# Patient Record
Sex: Male | Born: 1945 | Race: Black or African American | Hispanic: No | Marital: Married | Smoking: Former smoker
Health system: Southern US, Community
[De-identification: ages and names within clinical notes are randomized; demographics above are authoritative.]

## PROBLEM LIST (undated history)

## (undated) DIAGNOSIS — M1990 Unspecified osteoarthritis, unspecified site: Secondary | ICD-10-CM

## (undated) DIAGNOSIS — I5030 Unspecified diastolic (congestive) heart failure: Secondary | ICD-10-CM

## (undated) DIAGNOSIS — Z899 Acquired absence of limb, unspecified: Secondary | ICD-10-CM

## (undated) DIAGNOSIS — I700 Atherosclerosis of aorta: Secondary | ICD-10-CM

## (undated) DIAGNOSIS — A0472 Enterocolitis due to Clostridium difficile, not specified as recurrent: Secondary | ICD-10-CM

## (undated) DIAGNOSIS — I10 Essential (primary) hypertension: Secondary | ICD-10-CM

## (undated) DIAGNOSIS — E7800 Pure hypercholesterolemia, unspecified: Secondary | ICD-10-CM

## (undated) DIAGNOSIS — K5790 Diverticulosis of intestine, part unspecified, without perforation or abscess without bleeding: Secondary | ICD-10-CM

## (undated) DIAGNOSIS — G4730 Sleep apnea, unspecified: Secondary | ICD-10-CM

## (undated) DIAGNOSIS — I2510 Atherosclerotic heart disease of native coronary artery without angina pectoris: Secondary | ICD-10-CM

## (undated) DIAGNOSIS — K648 Other hemorrhoids: Secondary | ICD-10-CM

## (undated) DIAGNOSIS — I739 Peripheral vascular disease, unspecified: Secondary | ICD-10-CM

## (undated) DIAGNOSIS — D509 Iron deficiency anemia, unspecified: Secondary | ICD-10-CM

## (undated) DIAGNOSIS — J189 Pneumonia, unspecified organism: Secondary | ICD-10-CM

## (undated) DIAGNOSIS — E119 Type 2 diabetes mellitus without complications: Secondary | ICD-10-CM

## (undated) DIAGNOSIS — Z8739 Personal history of other diseases of the musculoskeletal system and connective tissue: Secondary | ICD-10-CM

## (undated) HISTORY — DX: Unspecified osteoarthritis, unspecified site: M19.90

## (undated) HISTORY — DX: Atherosclerotic heart disease of native coronary artery without angina pectoris: I25.10

## (undated) HISTORY — PX: CATARACT EXTRACTION W/ INTRAOCULAR LENS  IMPLANT, BILATERAL: SHX1307

## (undated) HISTORY — DX: Essential (primary) hypertension: I10

## (undated) HISTORY — DX: Atherosclerosis of aorta: I70.0

## (undated) HISTORY — PX: COLONOSCOPY W/ POLYPECTOMY: SHX1380

## (undated) HISTORY — DX: Peripheral vascular disease, unspecified: I73.9

## (undated) HISTORY — DX: Other hemorrhoids: K64.8

## (undated) HISTORY — DX: Iron deficiency anemia, unspecified: D50.9

## (undated) HISTORY — DX: Acquired absence of limb, unspecified: Z89.9

## (undated) HISTORY — DX: Diverticulosis of intestine, part unspecified, without perforation or abscess without bleeding: K57.90

---

## 1999-08-01 ENCOUNTER — Encounter: Admission: RE | Admit: 1999-08-01 | Discharge: 1999-10-30 | Payer: Self-pay | Admitting: Neurosurgery

## 1999-11-20 ENCOUNTER — Encounter: Admission: RE | Admit: 1999-11-20 | Discharge: 1999-12-05 | Payer: Self-pay

## 2002-08-04 ENCOUNTER — Ambulatory Visit (HOSPITAL_COMMUNITY): Admission: RE | Admit: 2002-08-04 | Discharge: 2002-08-04 | Payer: Self-pay | Admitting: Cardiology

## 2006-06-22 ENCOUNTER — Ambulatory Visit (HOSPITAL_COMMUNITY): Admission: RE | Admit: 2006-06-22 | Discharge: 2006-06-22 | Payer: Self-pay | Admitting: Cardiology

## 2008-03-08 ENCOUNTER — Ambulatory Visit (HOSPITAL_COMMUNITY): Admission: RE | Admit: 2008-03-08 | Discharge: 2008-03-08 | Payer: Self-pay | Admitting: Cardiology

## 2008-03-08 ENCOUNTER — Ambulatory Visit: Payer: Self-pay | Admitting: Vascular Surgery

## 2008-03-08 ENCOUNTER — Encounter (INDEPENDENT_AMBULATORY_CARE_PROVIDER_SITE_OTHER): Payer: Self-pay | Admitting: Cardiology

## 2009-01-09 ENCOUNTER — Ambulatory Visit (HOSPITAL_COMMUNITY): Admission: RE | Admit: 2009-01-09 | Discharge: 2009-01-09 | Payer: Self-pay | Admitting: Cardiology

## 2009-04-26 ENCOUNTER — Ambulatory Visit (HOSPITAL_COMMUNITY): Admission: RE | Admit: 2009-04-26 | Discharge: 2009-04-26 | Payer: Self-pay | Admitting: Cardiology

## 2009-04-26 ENCOUNTER — Encounter (INDEPENDENT_AMBULATORY_CARE_PROVIDER_SITE_OTHER): Payer: Self-pay | Admitting: Cardiology

## 2009-04-26 ENCOUNTER — Ambulatory Visit: Payer: Self-pay | Admitting: Vascular Surgery

## 2010-12-06 ENCOUNTER — Ambulatory Visit: Admit: 2010-12-06 | Payer: Self-pay | Admitting: Vascular Surgery

## 2010-12-15 ENCOUNTER — Encounter: Payer: Self-pay | Admitting: Cardiology

## 2011-01-07 ENCOUNTER — Encounter (INDEPENDENT_AMBULATORY_CARE_PROVIDER_SITE_OTHER): Payer: BC Managed Care – PPO | Admitting: Vascular Surgery

## 2011-01-07 ENCOUNTER — Other Ambulatory Visit (INDEPENDENT_AMBULATORY_CARE_PROVIDER_SITE_OTHER): Payer: BC Managed Care – PPO

## 2011-01-07 DIAGNOSIS — R0989 Other specified symptoms and signs involving the circulatory and respiratory systems: Secondary | ICD-10-CM

## 2011-01-07 DIAGNOSIS — I6529 Occlusion and stenosis of unspecified carotid artery: Secondary | ICD-10-CM

## 2011-01-07 NOTE — Consult Note (Signed)
NEW PATIENT CONSULTATION  Jeremiah [name], Jeremiah [name] DOB:  10-Dec-1945                                       01/07/2011 [id number]#:[id number]  Patient presents today for evaluation of asymptomatic carotid bruit.  He is a very pleasant 65 year old gentleman who was noted on physical exam by Dr. Shana Chute to have a carotid bruit.  He is here today for a duplex evaluation and discussion of this.  He denies any prior history of amaurosis fugax, transient ischemic attack, or stroke.  He does have a long history of non-insulin diabetes and hypertension.  He denies any cardiac disease.  He does report some claudication-type symptoms bilaterally.  He reports this is not limiting to him.  SOCIAL HISTORY:  He is single.  He quit smoking in 1982.  He does not drink alcohol.  FAMILY HISTORY:  Negative for premature atherosclerotic disease.  REVIEW OF SYSTEMS:  No weight loss or gain. VASCULAR:  He does have pain in his legs with walking. MUSCULOSKELETAL:  He does have arthritis and muscular pain.  Review systems otherwise negative.  PHYSICAL EXAMINATION:  A well-developed and well-nourished black male appearing his stated age in no acute distress. Blood pressure is 148/68, pulse 67, respirations 14. HEENT:  Normal. CHEST:  Clear bilaterally without rales, rhonchi or wheezes. HEART:  Regular rate and rhythm without murmur.  He has 2+ radial pulses bilaterally.  He does have diminished femoral pulses bilaterally.  I do not palpate distal pulses. ABDOMEN:  Mildly obese.  No masses, no tenderness, no aneurysm palpable. MUSCULOSKELETAL:  Shows no major deformity or cyanosis. NEUROLOGIC:  No focal weakness, paresthesias. SKIN:  Without ulcers or rashes.  He does have a soft right carotid bruit.  I do not appreciate a left carotid bruit.  He underwent a carotid duplex in our office today, and this shows minimal plaque bilaterally with no elevated velocities, suggesting no significant  stenosis.  I discussed this at length with patient and would not recommend any further follow-up or evaluations.  I did explain that he does seem to have mild claudication symptoms and explained that this could be treated but certainly would reserve this for worsening symptoms.  He does not have any evidence of limb-threatening ischemia. He was reassured with this discussion and will see [place] again on an as- needed basis.    Larina Earthly, M.D.  TFE/MEDQ  D:  01/07/2011  T:  01/07/2011  Job:  [id number]

## 2011-01-21 NOTE — Procedures (Unsigned)
CAROTID DUPLEX EXAM  INDICATION:  Bruit.  HISTORY: Diabetes:  Yes. Cardiac:  No. Hypertension:  Yes. Smoking:  No. Previous Surgery:  No. CV History:  No. Amaurosis Fugax No, Paresthesias No, Hemiparesis No                                      RIGHT             LEFT Brachial systolic pressure: Brachial Doppler waveforms:         Normal            Normal Vertebral direction of flow:        Antegrade         Antegrade DUPLEX VELOCITIES (cm/sec) CCA peak systolic                   118               112 ECA peak systolic                   126               112 ICA peak systolic                   123               101 ICA end diastolic                   24                24 PLAQUE MORPHOLOGY:                  Heterogeneous     Heterogeneous PLAQUE AMOUNT:                      Minimal           Minimal PLAQUE LOCATION:                    CCA/ICA/ECA       CCA/ICA/ECA  IMPRESSION: 1. Bilateral internal carotid artery velocity suggests 1% to 39%     stenosis. 2. Antegrade vertebral arteries bilaterally.  ___________________________________________ Larina Earthly, M.D.  EM/MEDQ  D:  01/07/2011  T:  01/07/2011  Job:  [id number]

## 2011-03-11 LAB — URINALYSIS, ROUTINE W REFLEX MICROSCOPIC
Bilirubin Urine: NEGATIVE
Glucose, UA: NEGATIVE mg/dL
Hgb urine dipstick: NEGATIVE
Ketones, ur: NEGATIVE mg/dL
Leukocytes, UA: NEGATIVE
Nitrite: NEGATIVE
Protein, ur: 30 mg/dL — AB
Specific Gravity, Urine: 1.023 (ref 1.005–1.030)
Urobilinogen, UA: 0.2 mg/dL (ref 0.0–1.0)
pH: 5.5 (ref 5.0–8.0)

## 2011-03-11 LAB — CBC
HCT: 40.5 % (ref 39.0–52.0)
Hemoglobin: 14.1 g/dL (ref 13.0–17.0)
MCHC: 34.7 g/dL (ref 30.0–36.0)
MCV: 87.2 fL (ref 78.0–100.0)
Platelets: 236 10*3/uL (ref 150–400)
RBC: 4.65 MIL/uL (ref 4.22–5.81)
RDW: 13.9 % (ref 11.5–15.5)
WBC: 12.6 10*3/uL — ABNORMAL HIGH (ref 4.0–10.5)

## 2011-03-11 LAB — COMPREHENSIVE METABOLIC PANEL
ALT: 19 U/L (ref 0–53)
AST: 20 U/L (ref 0–37)
Albumin: 3.8 g/dL (ref 3.5–5.2)
Alkaline Phosphatase: 120 U/L — ABNORMAL HIGH (ref 39–117)
BUN: 11 mg/dL (ref 6–23)
CO2: 26 mEq/L (ref 19–32)
Calcium: 10.1 mg/dL (ref 8.4–10.5)
Chloride: 101 mEq/L (ref 96–112)
Creatinine, Ser: 0.83 mg/dL (ref 0.4–1.5)
GFR calc Af Amer: >60 mL/min (ref >60)
GFR calc non Af Amer: >60 mL/min (ref >60)
Glucose, Bld: 145 mg/dL — ABNORMAL HIGH (ref 70–99)
Potassium: 3.8 mEq/L (ref 3.5–5.1)
Sodium: 136 mEq/L (ref 135–145)
Total Bilirubin: 1 mg/dL (ref 0.3–1.2)
Total Protein: 8.9 g/dL — ABNORMAL HIGH (ref 6.0–8.3)

## 2011-03-11 LAB — LIPID PANEL
Cholesterol: 184 mg/dL (ref 0–200)
HDL: 43 mg/dL (ref >39)
LDL Cholesterol: 113 mg/dL — ABNORMAL HIGH (ref 0–99)
Total CHOL/HDL Ratio: 4.3 RATIO
Triglycerides: 140 mg/dL (ref <150)
VLDL: 28 mg/dL (ref 0–40)

## 2011-03-11 LAB — URINE MICROSCOPIC-ADD ON

## 2011-03-11 LAB — PSA: PSA: 0.41 ng/mL (ref 0.10–4.00)

## 2011-03-11 LAB — T4: T4, Total: 8.5 ug/dL (ref 5.0–12.5)

## 2011-03-11 LAB — MICROALBUMIN, URINE: Microalb, Ur: 19.47 mg/dL — ABNORMAL HIGH (ref 0.00–1.89)

## 2011-03-11 LAB — TSH: TSH: 0.84 u[IU]/mL (ref 0.350–4.500)

## 2012-06-03 ENCOUNTER — Ambulatory Visit (HOSPITAL_COMMUNITY)
Admission: RE | Admit: 2012-06-03 | Discharge: 2012-06-03 | Disposition: A | Discharge status: Home / Self Care | Payer: BC Managed Care – PPO | Source: Ambulatory Visit | Attending: Cardiology | Admitting: Cardiology

## 2012-06-03 DIAGNOSIS — R0989 Other specified symptoms and signs involving the circulatory and respiratory systems: Secondary | ICD-10-CM

## 2012-06-03 NOTE — Progress Notes (Signed)
*  PRELIMINARY RESULTS* Vascular Ultrasound Carotid Duplex (Doppler) has been completed.  Preliminary findings: Bilaterally no significant ICA stenosis with antegrade vertebral flow.  EUNICE, Noble Cicalese RDMS 06/03/2012, 10:41 AM

## 2014-02-20 ENCOUNTER — Other Ambulatory Visit (HOSPITAL_COMMUNITY): Payer: Self-pay | Admitting: Cardiology

## 2014-02-20 DIAGNOSIS — I70219 Atherosclerosis of native arteries of extremities with intermittent claudication, unspecified extremity: Secondary | ICD-10-CM

## 2014-02-20 DIAGNOSIS — I739 Peripheral vascular disease, unspecified: Secondary | ICD-10-CM

## 2014-03-27 ENCOUNTER — Ambulatory Visit (HOSPITAL_COMMUNITY): Payer: Medicare Other

## 2014-03-28 ENCOUNTER — Ambulatory Visit (HOSPITAL_COMMUNITY)
Admission: RE | Admit: 2014-03-28 | Discharge: 2014-03-28 | Disposition: A | Discharge status: Home / Self Care | Payer: Medicare Other | Source: Ambulatory Visit | Attending: Cardiology | Admitting: Cardiology

## 2014-03-28 ENCOUNTER — Other Ambulatory Visit (HOSPITAL_COMMUNITY): Payer: Self-pay | Admitting: Cardiology

## 2014-03-28 DIAGNOSIS — M79609 Pain in unspecified limb: Secondary | ICD-10-CM | POA: Insufficient documentation

## 2014-03-28 DIAGNOSIS — I739 Peripheral vascular disease, unspecified: Secondary | ICD-10-CM | POA: Insufficient documentation

## 2014-03-28 DIAGNOSIS — I70219 Atherosclerosis of native arteries of extremities with intermittent claudication, unspecified extremity: Secondary | ICD-10-CM | POA: Insufficient documentation

## 2014-03-28 NOTE — Progress Notes (Signed)
VASCULAR LAB PRELIMINARY  ARTERIAL = Right Mid femoral artery demonstrates 50-99% stenosis, with monophasic flow distally. Left Distal femoral artery is occluded, with collateral monophasic flow distally.  ABI completed:   RIGHT    LEFT    PRESSURE WAVEFORM  PRESSURE WAVEFORM  BRACHIAL 146 Tri BRACHIAL 138 Tri  DP 87 Damp mono DP    AT   AT    PT 85 Damp mono PT 98 Mono  PER   PER    GREAT TOE  NA GREAT TOE  NA    RIGHT LEFT  ABI 0.6 0.67    [address]Virgina Slaughter, RVS [place] ChardJill I Eunice, RVT, RDMS  03/28/2014, 10:59 AM

## 2014-08-21 ENCOUNTER — Encounter: Payer: Self-pay | Admitting: Vascular Surgery

## 2014-09-04 ENCOUNTER — Encounter: Payer: Self-pay | Admitting: Vascular Surgery

## 2014-09-05 ENCOUNTER — Encounter: Payer: Self-pay | Admitting: Vascular Surgery

## 2014-09-05 ENCOUNTER — Ambulatory Visit (INDEPENDENT_AMBULATORY_CARE_PROVIDER_SITE_OTHER): Payer: Medicare Other | Admitting: Vascular Surgery

## 2014-09-05 VITALS — BP 136/73 | HR 60 | Resp 16 | Ht 69.0 in | Wt 198.6 lb

## 2014-09-05 DIAGNOSIS — I739 Peripheral vascular disease, unspecified: Secondary | ICD-10-CM | POA: Insufficient documentation

## 2014-09-05 NOTE — Progress Notes (Signed)
Patient name: Jeremiah [name] MRN: [id number] DOB: 1946/07/11 Sex: male   Referred by: S[place]  Reason for referral:  Chief Complaint  Patient presents with  . New Evaluation    pain bilateral great toe (R>L)    . PVD    HISTORY OF PRESENT ILLNESS: Patient presents today for discussion of lower extremity pain. I had seen him in 2012th. At that time he was found to have carotid bruits. His carotid duplex that time showed no evidence of any carotid stenosis. He did have claudication type symptoms I explained that he should continue his walking her. Questioning the patient reports it is having pain in his right great toe. This can be worse at night. He does walk and walking program and report some right hip pain is attributed this to prior spinal stenosis diagnosis rather than claudication type symptoms. He specifically has had no tissue loss such as ulceration or ingrown toenail of the feet. Does have a long history of diabetes. Recent noninvasive studies suggested moderate to severe at her lower arch her insufficiency  Past Medical History  Diagnosis Date  . Diabetes mellitus without complication   . Peripheral vascular disease   . Arthritis   . Hypertension     History reviewed. No pertinent past surgical history.  History   Social History  . Marital Status: Divorced    Spouse Name: N/A    Number of Children: N/A  . Years of Education: N/A   Occupational History  . Not on file.   Social History Main Topics  . Smoking status: Former Smoker    Quit date: 11/25/1983  . Smokeless tobacco: Not on file  . Alcohol Use: No  . Drug Use: No  . Sexual Activity: Not on file   Other Topics Concern  . Not on file   Social History Narrative  . No narrative on file    Family History  Problem Relation Age of Onset  . Diabetes Mother   . Hypertension Mother   . Heart disease Father   . Heart attack Father   . Diabetes Sister   . Hypertension Sister   . Diabetes Brother    . Heart disease Brother   . Hypertension Brother   . Heart attack Brother     Allergies as of 09/05/2014  . (No Known Allergies)    No current outpatient prescriptions on file prior to visit.   No current facility-administered medications on file prior to visit.     REVIEW OF SYSTEMS:  Positives indicated with an "X"  CARDIOVASCULAR:  [ ]  chest pain   [ ]  chest pressure   [ ]  palpitations   [ ]  orthopnea   [ ]  dyspnea on exertion   [x ] claudication   [x ] rest pain   [ ]  DVT   [ ]  phlebitis PULMONARY:   [ ]  productive cough   [ ]  asthma   [ ]  wheezing NEUROLOGIC:   [ ]  weakness  [x ] paresthesias  [ ]  aphasia  [ ]  amaurosis  [ ]  dizziness HEMATOLOGIC:   [ ]  bleeding problems   [ ]  clotting disorders MUSCULOSKELETAL:  [ ]  joint pain   [ ]  joint swelling GASTROINTESTINAL: [ ]   blood in stool  [ ]   hematemesis GENITOURINARY:  [ ]   dysuria  [ ]   hematuria PSYCHIATRIC:  [ ]  history of major depression INTEGUMENTARY:  [ ]  rashes  [ ]  ulcers CONSTITUTIONAL:  [ ]  fever   [ ]   chills  PHYSICAL EXAMINATION:  General: The patient is a well-nourished male, in no acute distress. Vital signs are BP 136/73  Pulse 60  Resp 16  Ht 5\' 9"  (1.753 m)  Wt 198 lb 9.6 oz (90.084 kg)  BMI 29.31 kg/m2 Pulmonary: There is a good air exchange bilaterally without wheezing or rales. Abdomen: Soft and non-tender with normal pitch bowel sounds. Musculoskeletal: There are no major deformities.  There is no significant extremity pain. Neurologic: No focal weakness or paresthesias are detected, Skin: There are no ulcer or rashes noted. Psychiatric: The patient has normal affect. Cardiovascular: There is a regular rate and rhythm without significant murmur appreciated. Pulse status: 2+ radial pulses bilaterally. He does have palpable femoral pulses and absent popliteal and distal pulses No tissue loss in his feet. Both feet are warm.  Vascular Lab Studies:  I reviewed his noninvasive studies from  04/07/2014. This reveals ankle arm index of 0.60 on the right and 0.67 left. 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 It is difficult to tell by history if he is truly describing arterial rest pain in his right foot. He reports this is been corrected with the analgesic patch. He did not have any tissue loss in his foot does appear to be well-perfused. I did explain the option of arteriography for further evaluation. He wishes to defer any further evaluation and continued observation only. I feel that this is appropriate. I am not convinced that this is truly arterial rest pain. Does not appear to have critical limb ischemia on physical findings 1. Stress to critical importance to notify us should he develop any tissue loss in his lower extremities. He understands and will see us again on as-needed basis    Kadden Osterhout [facility] Office: [phone]

## 2015-03-08 ENCOUNTER — Encounter (INDEPENDENT_AMBULATORY_CARE_PROVIDER_SITE_OTHER): Payer: Self-pay | Admitting: Ophthalmology

## 2015-03-22 ENCOUNTER — Encounter (INDEPENDENT_AMBULATORY_CARE_PROVIDER_SITE_OTHER): Payer: Medicare Other | Admitting: Ophthalmology

## 2015-03-22 DIAGNOSIS — H2512 Age-related nuclear cataract, left eye: Secondary | ICD-10-CM | POA: Diagnosis not present

## 2015-03-22 DIAGNOSIS — E11351 Type 2 diabetes mellitus with proliferative diabetic retinopathy with macular edema: Secondary | ICD-10-CM | POA: Diagnosis not present

## 2015-03-22 DIAGNOSIS — E11339 Type 2 diabetes mellitus with moderate nonproliferative diabetic retinopathy without macular edema: Secondary | ICD-10-CM | POA: Diagnosis not present

## 2015-03-22 DIAGNOSIS — E10311 Type 1 diabetes mellitus with unspecified diabetic retinopathy with macular edema: Secondary | ICD-10-CM | POA: Diagnosis not present

## 2015-03-22 DIAGNOSIS — H43813 Vitreous degeneration, bilateral: Secondary | ICD-10-CM | POA: Diagnosis not present

## 2015-07-24 ENCOUNTER — Ambulatory Visit (INDEPENDENT_AMBULATORY_CARE_PROVIDER_SITE_OTHER): Payer: Medicare Other | Admitting: Ophthalmology

## 2015-08-16 ENCOUNTER — Ambulatory Visit (INDEPENDENT_AMBULATORY_CARE_PROVIDER_SITE_OTHER): Payer: Medicare Other | Admitting: Ophthalmology

## 2015-08-16 DIAGNOSIS — H43813 Vitreous degeneration, bilateral: Secondary | ICD-10-CM | POA: Diagnosis not present

## 2015-08-16 DIAGNOSIS — E11319 Type 2 diabetes mellitus with unspecified diabetic retinopathy without macular edema: Secondary | ICD-10-CM

## 2015-08-16 DIAGNOSIS — E11339 Type 2 diabetes mellitus with moderate nonproliferative diabetic retinopathy without macular edema: Secondary | ICD-10-CM

## 2015-08-16 DIAGNOSIS — E11359 Type 2 diabetes mellitus with proliferative diabetic retinopathy without macular edema: Secondary | ICD-10-CM | POA: Diagnosis not present

## 2015-08-16 DIAGNOSIS — I10 Essential (primary) hypertension: Secondary | ICD-10-CM | POA: Diagnosis not present

## 2015-08-16 DIAGNOSIS — H35033 Hypertensive retinopathy, bilateral: Secondary | ICD-10-CM

## 2016-02-19 ENCOUNTER — Ambulatory Visit (INDEPENDENT_AMBULATORY_CARE_PROVIDER_SITE_OTHER): Payer: Medicare Other | Admitting: Ophthalmology

## 2016-07-08 ENCOUNTER — Other Ambulatory Visit: Payer: Self-pay | Admitting: Cardiology

## 2016-07-08 ENCOUNTER — Other Ambulatory Visit (HOSPITAL_COMMUNITY): Payer: Self-pay | Admitting: Cardiology

## 2016-07-08 DIAGNOSIS — I6529 Occlusion and stenosis of unspecified carotid artery: Secondary | ICD-10-CM

## 2016-07-08 DIAGNOSIS — R296 Repeated falls: Secondary | ICD-10-CM

## 2016-07-08 DIAGNOSIS — R55 Syncope and collapse: Secondary | ICD-10-CM

## 2016-07-15 ENCOUNTER — Ambulatory Visit (HOSPITAL_COMMUNITY)
Admission: RE | Admit: 2016-07-15 | Discharge: 2016-07-15 | Disposition: A | Discharge status: Home / Self Care | Payer: Medicare Other | Source: Ambulatory Visit | Attending: Cardiology | Admitting: Cardiology

## 2016-07-15 DIAGNOSIS — I6523 Occlusion and stenosis of bilateral carotid arteries: Secondary | ICD-10-CM | POA: Insufficient documentation

## 2016-07-15 DIAGNOSIS — E119 Type 2 diabetes mellitus without complications: Secondary | ICD-10-CM | POA: Diagnosis not present

## 2016-07-15 DIAGNOSIS — I6529 Occlusion and stenosis of unspecified carotid artery: Secondary | ICD-10-CM

## 2016-07-15 DIAGNOSIS — R55 Syncope and collapse: Secondary | ICD-10-CM | POA: Insufficient documentation

## 2016-07-15 LAB — VAS US CAROTID
LEFT ECA DIAS: -6 cm/s
LEFT VERTEBRAL DIAS: 11 cm/s
Left CCA dist dias: -13 cm/s
Left CCA dist sys: -89 cm/s
Left CCA prox dias: 16 cm/s
Left CCA prox sys: 124 cm/s
Left ICA dist dias: -11 cm/s
Left ICA dist sys: -51 cm/s
Left ICA prox dias: -16 cm/s
Left ICA prox sys: -84 cm/s
RIGHT ECA DIAS: -13 cm/s
RIGHT VERTEBRAL DIAS: 16 cm/s
Right CCA prox dias: 11 cm/s
Right CCA prox sys: 131 cm/s
Right cca dist sys: -93 cm/s

## 2016-07-15 NOTE — Progress Notes (Signed)
*  PRELIMINARY RESULTS* Vascular Ultrasound Carotid Duplex (Doppler) has been completed.  Preliminary findings: Bilateral: No significant (1-39%) ICA stenosis. Antegrade vertebral flow.  (Test appears unchanged from study from 06/03/12)    Farrel DemarkJill Eunice, RDMS, RVT  07/15/2016, 9:49 AM

## 2016-07-17 ENCOUNTER — Ambulatory Visit
Admission: RE | Admit: 2016-07-17 | Discharge: 2016-07-17 | Disposition: A | Discharge status: Home / Self Care | Payer: Medicare Other | Source: Ambulatory Visit | Attending: Cardiology | Admitting: Cardiology

## 2016-07-17 DIAGNOSIS — R55 Syncope and collapse: Secondary | ICD-10-CM

## 2016-07-17 DIAGNOSIS — R296 Repeated falls: Secondary | ICD-10-CM

## 2017-03-03 ENCOUNTER — Ambulatory Visit: Payer: Medicare Other | Admitting: Adult Health

## 2018-01-30 ENCOUNTER — Emergency Department (HOSPITAL_COMMUNITY): Payer: Medicare Other

## 2018-01-30 ENCOUNTER — Other Ambulatory Visit: Payer: Self-pay

## 2018-01-30 ENCOUNTER — Encounter (HOSPITAL_COMMUNITY): Payer: Self-pay | Admitting: Family Medicine

## 2018-01-30 ENCOUNTER — Inpatient Hospital Stay (HOSPITAL_COMMUNITY)
Admission: EM | Admit: 2018-01-30 | Discharge: 2018-02-02 | DRG: 445 | Disposition: A | Discharge status: Home / Self Care | Payer: Medicare Other | Attending: Internal Medicine | Admitting: Internal Medicine

## 2018-01-30 DIAGNOSIS — Z7982 Long term (current) use of aspirin: Secondary | ICD-10-CM

## 2018-01-30 DIAGNOSIS — K810 Acute cholecystitis: Secondary | ICD-10-CM

## 2018-01-30 DIAGNOSIS — N179 Acute kidney failure, unspecified: Secondary | ICD-10-CM | POA: Diagnosis present

## 2018-01-30 DIAGNOSIS — Z7984 Long term (current) use of oral hypoglycemic drugs: Secondary | ICD-10-CM

## 2018-01-30 DIAGNOSIS — I10 Essential (primary) hypertension: Secondary | ICD-10-CM | POA: Diagnosis present

## 2018-01-30 DIAGNOSIS — Z87891 Personal history of nicotine dependence: Secondary | ICD-10-CM

## 2018-01-30 DIAGNOSIS — K8000 Calculus of gallbladder with acute cholecystitis without obstruction: Secondary | ICD-10-CM | POA: Diagnosis present

## 2018-01-30 DIAGNOSIS — E785 Hyperlipidemia, unspecified: Secondary | ICD-10-CM | POA: Diagnosis present

## 2018-01-30 DIAGNOSIS — E1151 Type 2 diabetes mellitus with diabetic peripheral angiopathy without gangrene: Secondary | ICD-10-CM | POA: Diagnosis present

## 2018-01-30 DIAGNOSIS — D72829 Elevated white blood cell count, unspecified: Secondary | ICD-10-CM | POA: Diagnosis present

## 2018-01-30 DIAGNOSIS — E119 Type 2 diabetes mellitus without complications: Secondary | ICD-10-CM | POA: Diagnosis not present

## 2018-01-30 DIAGNOSIS — D649 Anemia, unspecified: Secondary | ICD-10-CM | POA: Diagnosis present

## 2018-01-30 DIAGNOSIS — Z8249 Family history of ischemic heart disease and other diseases of the circulatory system: Secondary | ICD-10-CM | POA: Diagnosis not present

## 2018-01-30 DIAGNOSIS — E860 Dehydration: Secondary | ICD-10-CM | POA: Diagnosis present

## 2018-01-30 DIAGNOSIS — K819 Cholecystitis, unspecified: Secondary | ICD-10-CM

## 2018-01-30 DIAGNOSIS — Z79899 Other long term (current) drug therapy: Secondary | ICD-10-CM

## 2018-01-30 DIAGNOSIS — Z833 Family history of diabetes mellitus: Secondary | ICD-10-CM

## 2018-01-30 DIAGNOSIS — R1011 Right upper quadrant pain: Secondary | ICD-10-CM | POA: Diagnosis present

## 2018-01-30 LAB — COMPREHENSIVE METABOLIC PANEL
ALT: 53 U/L (ref 17–63)
AST: 50 U/L — ABNORMAL HIGH (ref 15–41)
Albumin: 2.8 g/dL — ABNORMAL LOW (ref 3.5–5.0)
Alkaline Phosphatase: 112 U/L (ref 38–126)
Anion gap: 12 (ref 5–15)
BUN: 38 mg/dL — ABNORMAL HIGH (ref 6–20)
CO2: 24 mmol/L (ref 22–32)
Calcium: 8.9 mg/dL (ref 8.9–10.3)
Chloride: 100 mmol/L — ABNORMAL LOW (ref 101–111)
Creatinine, Ser: 2.23 mg/dL — ABNORMAL HIGH (ref 0.61–1.24)
GFR calc Af Amer: 32 mL/min — ABNORMAL LOW (ref >60)
GFR calc non Af Amer: 28 mL/min — ABNORMAL LOW (ref >60)
Glucose, Bld: 302 mg/dL — ABNORMAL HIGH (ref 65–99)
Potassium: 4.4 mmol/L (ref 3.5–5.1)
Sodium: 136 mmol/L (ref 135–145)
Total Bilirubin: 1.3 mg/dL — ABNORMAL HIGH (ref 0.3–1.2)
Total Protein: 8.1 g/dL (ref 6.5–8.1)

## 2018-01-30 LAB — URINALYSIS, ROUTINE W REFLEX MICROSCOPIC
Bacteria, UA: NONE SEEN
Bilirubin Urine: NEGATIVE
Glucose, UA: >=500 mg/dL — AB
Ketones, ur: NEGATIVE mg/dL
Leukocytes, UA: NEGATIVE
Nitrite: NEGATIVE
Protein, ur: 100 mg/dL — AB
Specific Gravity, Urine: 1.023 (ref 1.005–1.030)
pH: 5 (ref 5.0–8.0)

## 2018-01-30 LAB — CBC WITH DIFFERENTIAL/PLATELET
Basophils Absolute: 0 10*3/uL (ref 0.0–0.1)
Basophils Relative: 0 %
Eosinophils Absolute: 0 10*3/uL (ref 0.0–0.7)
Eosinophils Relative: 0 %
HCT: 36.5 % — ABNORMAL LOW (ref 39.0–52.0)
Hemoglobin: 12.6 g/dL — ABNORMAL LOW (ref 13.0–17.0)
Lymphocytes Relative: 5 %
Lymphs Abs: 1.1 10*3/uL (ref 0.7–4.0)
MCH: 30.1 pg (ref 26.0–34.0)
MCHC: 34.5 g/dL (ref 30.0–36.0)
MCV: 87.1 fL (ref 78.0–100.0)
Monocytes Absolute: 3.3 10*3/uL — ABNORMAL HIGH (ref 0.1–1.0)
Monocytes Relative: 15 %
Neutro Abs: 18.2 10*3/uL — ABNORMAL HIGH (ref 1.7–7.7)
Neutrophils Relative %: 80 %
Platelets: 241 10*3/uL (ref 150–400)
RBC: 4.19 MIL/uL — ABNORMAL LOW (ref 4.22–5.81)
RDW: 14.9 % (ref 11.5–15.5)
WBC: 22.6 10*3/uL — ABNORMAL HIGH (ref 4.0–10.5)

## 2018-01-30 LAB — I-STAT TROPONIN, ED: Troponin i, poc: 0 ng/mL (ref 0.00–0.08)

## 2018-01-30 LAB — LIPASE, BLOOD: Lipase: 20 U/L (ref 11–51)

## 2018-01-30 LAB — CBG MONITORING, ED: Glucose-Capillary: 251 mg/dL — ABNORMAL HIGH (ref 65–99)

## 2018-01-30 MED ORDER — INSULIN ASPART 100 UNIT/ML ~~LOC~~ SOLN
0.0000 [IU] | SUBCUTANEOUS | Status: DC
  Administered 2018-01-30 – 2018-01-31 (×2): 5 [IU] via SUBCUTANEOUS
  Administered 2018-01-31: 2 [IU] via SUBCUTANEOUS
  Administered 2018-01-31 (×2): 1 [IU] via SUBCUTANEOUS
  Administered 2018-01-31 – 2018-02-01 (×3): 2 [IU] via SUBCUTANEOUS
  Administered 2018-02-01: 08:00:00 1 [IU] via SUBCUTANEOUS
  Administered 2018-02-01: 3 [IU] via SUBCUTANEOUS
  Filled 2018-01-30: qty 1

## 2018-01-30 MED ORDER — TIMOLOL MALEATE 0.5 % OP SOLN
1.0000 [drp] | Freq: Two times a day (BID) | OPHTHALMIC | Status: DC
  Administered 2018-01-30 – 2018-02-02 (×5): 1 [drp] via OPHTHALMIC
  Filled 2018-01-30: qty 5

## 2018-01-30 MED ORDER — HEPARIN SODIUM (PORCINE) 5000 UNIT/ML IJ SOLN
5000.0000 [IU] | Freq: Three times a day (TID) | INTRAMUSCULAR | Status: DC
  Filled 2018-01-30 (×2): qty 1

## 2018-01-30 MED ORDER — PROMETHAZINE HCL 25 MG PO TABS: 12.5000 mg | ORAL_TABLET | Freq: Four times a day (QID) | ORAL | Status: DC | PRN

## 2018-01-30 MED ORDER — SODIUM CHLORIDE 0.9 % IV SOLN
INTRAVENOUS | Status: AC
Start: 2018-01-30 — End: 2018-01-31
  Administered 2018-01-30: 21:00:00 via INTRAVENOUS

## 2018-01-30 MED ORDER — FAMOTIDINE IN NACL 20-0.9 MG/50ML-% IV SOLN
20.0000 mg | Freq: Two times a day (BID) | INTRAVENOUS | Status: DC
  Administered 2018-01-30 – 2018-01-31 (×3): 20 mg via INTRAVENOUS
  Filled 2018-01-30 (×4): qty 50

## 2018-01-30 MED ORDER — ONDANSETRON HCL 4 MG/2ML IJ SOLN: 4.0000 mg | Freq: Four times a day (QID) | INTRAMUSCULAR | Status: DC | PRN

## 2018-01-30 MED ORDER — PIPERACILLIN-TAZOBACTAM 3.375 G IVPB
3.3750 g | Freq: Three times a day (TID) | INTRAVENOUS | Status: DC
  Administered 2018-01-31 – 2018-02-02 (×7): 3.375 g via INTRAVENOUS
  Filled 2018-01-30 (×9): qty 50

## 2018-01-30 MED ORDER — FENTANYL CITRATE (PF) 100 MCG/2ML IJ SOLN
25.0000 ug | INTRAMUSCULAR | Status: DC | PRN
  Administered 2018-01-31: 50 ug via INTRAVENOUS
  Filled 2018-01-30: qty 2

## 2018-01-30 MED ORDER — ACETAMINOPHEN 325 MG PO TABS: 650.0000 mg | ORAL_TABLET | Freq: Four times a day (QID) | ORAL | Status: DC | PRN

## 2018-01-30 MED ORDER — PIPERACILLIN-TAZOBACTAM 3.375 G IVPB 30 MIN
3.3750 g | INTRAVENOUS | Status: AC
  Administered 2018-01-30: 3.375 g via INTRAVENOUS
  Filled 2018-01-30: qty 50

## 2018-01-30 MED ORDER — HYDROMORPHONE HCL 1 MG/ML IJ SOLN
0.5000 mg | Freq: Once | INTRAMUSCULAR | Status: AC
  Administered 2018-01-30: 0.5 mg via INTRAVENOUS
  Filled 2018-01-30: qty 1

## 2018-01-30 MED ORDER — SODIUM CHLORIDE 0.9 % IV BOLUS (SEPSIS)
1000.0000 mL | Freq: Once | INTRAVENOUS | Status: AC
  Administered 2018-01-30: 1000 mL via INTRAVENOUS

## 2018-01-30 MED ORDER — IOPAMIDOL (ISOVUE-300) INJECTION 61%
INTRAVENOUS | Status: AC
  Administered 2018-01-31: 10 mL
  Filled 2018-01-30: qty 30

## 2018-01-30 MED ORDER — KETOROLAC TROMETHAMINE 0.5 % OP SOLN
1.0000 [drp] | Freq: Every day | OPHTHALMIC | Status: DC
  Administered 2018-01-30: 23:00:00 1 [drp] via OPHTHALMIC
  Filled 2018-01-30: qty 5

## 2018-01-30 MED ORDER — ACETAMINOPHEN 650 MG RE SUPP: 650.0000 mg | Freq: Four times a day (QID) | RECTAL | Status: DC | PRN

## 2018-01-30 MED ORDER — HYDRALAZINE HCL 20 MG/ML IJ SOLN: 10.0000 mg | INTRAMUSCULAR | Status: DC | PRN

## 2018-01-30 NOTE — ED Notes (Signed)
US at bedside

## 2018-01-30 NOTE — ED Notes (Signed)
ED TO INPATIENT HANDOFF REPORT  Name/Age/Gender Jeremiah [name] 72 y.o. male  Code Status    Code Status Orders  (From admission, onward)        Start     Ordered   01/30/18 2044  Full code  Continuous     01/30/18 2045    Code Status History    Date Active Date Inactive Code Status Order ID Comments User Context   This patient has a current code status but no historical code status.      Home/SNF/Other Home  Chief Complaint Weakness; Nausea; Vomiting  Level of Care/Admitting Diagnosis ED Disposition    ED Disposition Condition Comment   Admit  Hospital Area: [facility] [[id number]]  Level of Care: Med-Surg [16]  Diagnosis: Acute cholecystitis [575.0.ICD-9-CM]  Admitting Physician: Vianne Bulls [[id number]]  Attending Physician: Vianne Bulls [[id number]]  Estimated length of stay: past midnight tomorrow  Certification:: I certify this patient will need inpatient services for at least 2 midnights  PT Class (Do Not Modify): Inpatient [101]  PT Acc Code (Do Not Modify): Private [1]       Medical History Past Medical History:  Diagnosis Date  . Arthritis   . Diabetes mellitus without complication ([place])   . Hypertension   . Peripheral vascular disease ([place])     Allergies No Known Allergies  IV Location/Drains/Wounds Patient Lines/Drains/Airways Status   Active Line/Drains/Airways    None          Labs/Imaging Results for orders placed or performed during the hospital encounter of 01/30/18 (from the past 48 hour(s))  Comprehensive metabolic panel     Status: Abnormal   Collection Time: 01/30/18  3:47 PM  Result Value Ref Range   Sodium 136 135 - 145 mmol/L   Potassium 4.4 3.5 - 5.1 mmol/L   Chloride 100 (L) 101 - 111 mmol/L   CO2 24 22 - 32 mmol/L   Glucose, Bld 302 (H) 65 - 99 mg/dL   BUN 38 (H) 6 - 20 mg/dL   Creatinine, Ser 2.23 (H) 0.61 - 1.24 mg/dL   Calcium 8.9 8.9 - 10.3 mg/dL   Total Protein 8.1 6.5 - 8.1 g/dL    Albumin 2.8 (L) 3.5 - 5.0 g/dL   AST 50 (H) 15 - 41 U/L   ALT 53 17 - 63 U/L   Alkaline Phosphatase 112 38 - 126 U/L   Total Bilirubin 1.3 (H) 0.3 - 1.2 mg/dL   GFR calc non Af Amer 28 (L) >60 mL/min   GFR calc Af Amer 32 (L) >60 mL/min    Comment: (NOTE) The eGFR has been calculated using the CKD EPI equation. This calculation has not been validated in all clinical situations. eGFR's persistently <60 mL/min signify possible Chronic Kidney Disease.    Anion gap 12 5 - 15    Comment: Performed at [facility], [place] [address] [place], [place] [address]  Lipase, blood     Status: None   Collection Time: 01/30/18  3:47 PM  Result Value Ref Range   Lipase 20 11 - 51 U/L    Comment: Performed at [facility], [place] [address], [place], [place] [address]  CBC with Differential     Status: Abnormal   Collection Time: 01/30/18  3:47 PM  Result Value Ref Range   WBC 22.6 (H) 4.0 - 10.5 K/uL   RBC 4.19 (L) 4.22 - 5.81 MIL/uL   Hemoglobin 12.6 (L) 13.0 -  17.0 g/dL   HCT 36.5 (L) 39.0 - 52.0 %   MCV 87.1 78.0 - 100.0 fL   MCH 30.1 26.0 - 34.0 pg   MCHC 34.5 30.0 - 36.0 g/dL   RDW 14.9 11.5 - 15.5 %   Platelets 241 150 - 400 K/uL   Neutrophils Relative % 80 %   Neutro Abs 18.2 (H) 1.7 - 7.7 K/uL   Lymphocytes Relative 5 %   Lymphs Abs 1.1 0.7 - 4.0 K/uL   Monocytes Relative 15 %   Monocytes Absolute 3.3 (H) 0.1 - 1.0 K/uL   Eosinophils Relative 0 %   Eosinophils Absolute 0.0 0.0 - 0.7 K/uL   Basophils Relative 0 %   Basophils Absolute 0.0 0.0 - 0.1 K/uL    Comment: Performed at [facility], [place] [address] [place], [place] [address]  Urinalysis, Routine w reflex microscopic     Status: Abnormal   Collection Time: 01/30/18  3:47 PM  Result Value Ref Range   Color, Urine YELLOW YELLOW   APPearance CLEAR CLEAR   Specific Gravity, Urine 1.023 1.005 - 1.030   pH 5.0 5.0 - 8.0   Glucose, UA >=500 (A) NEGATIVE mg/dL   Hgb urine  dipstick SMALL (A) NEGATIVE   Bilirubin Urine NEGATIVE NEGATIVE   Ketones, ur NEGATIVE NEGATIVE mg/dL   Protein, ur 100 (A) NEGATIVE mg/dL   Nitrite NEGATIVE NEGATIVE   Leukocytes, UA NEGATIVE NEGATIVE   RBC / HPF 0-5 0 - 5 RBC/hpf   WBC, UA 0-5 0 - 5 WBC/hpf   Bacteria, UA NONE SEEN NONE SEEN   Squamous Epithelial / LPF 0-5 (A) NONE SEEN    Comment: Performed at [facility], [place] [address], [place], [place] [address]  I-stat troponin, ED     Status: None   Collection Time: 01/30/18  4:03 PM  Result Value Ref Range   Troponin i, poc 0.00 0.00 - 0.08 ng/mL   Comment 3            Comment: Due to the release kinetics of cTnI, a negative result within the first hours of the onset of symptoms does not rule out myocardial infarction with certainty. If myocardial infarction is still suspected, repeat the test at appropriate intervals.   CBG monitoring, ED     Status: Abnormal   Collection Time: 01/30/18  8:59 PM  Result Value Ref Range   Glucose-Capillary 251 (H) 65 - 99 mg/dL   Ct Abdomen Pelvis Wo Contrast  Result Date: 01/30/2018 CLINICAL DATA:  Nausea, vomiting, right lower quadrant pain and abdominal distention. EXAM: CT ABDOMEN AND PELVIS WITHOUT CONTRAST TECHNIQUE: Multidetector CT imaging of the abdomen and pelvis was performed following the standard protocol without IV contrast. COMPARISON:  01/30/2018 abdominal sonogram FINDINGS: Lower chest: Mild-to-moderate bibasilar atelectasis, right greater than left. Three-vessel coronary atherosclerosis. Hepatobiliary: Normal liver size. No liver mass. Distended gallbladder is filled with sludge and gallstones measuring up to 0.9 cm. Mild diffuse gallbladder wall thickening with haziness of the pericholecystic fat. No biliary ductal dilatation. Pancreas: Normal, with no mass or duct dilation. Spleen: Normal size. No mass. Adrenals/Urinary Tract: Normal adrenals. No contour deforming renal masses. No renal stones. No  hydronephrosis. Normal bladder. Stomach/Bowel: Normal non-distended stomach. Nonspecific mild wall thickening throughout descending duodenum with associated peri duodenal fat stranding. A few mildly dilated small bowel loops with associated fluid levels are noted in anterior abdomen measuring up to the 3.2 cm diameter. Distal small bowel is collapsed. No discrete  small bowel caliber transition. No additional sites of small bowel wall thickening. Normal appendix. Mild scattered colonic diverticulosis. No large bowel wall thickening. Fluid levels throughout right and transverse colon. Vascular/Lymphatic: Atherosclerotic nonaneurysmal abdominal aorta. No pathologically enlarged lymph nodes in the abdomen or pelvis. Reproductive: Normal size prostate. Other: No pneumoperitoneum. Small volume perihepatic ascites. No focal fluid collection. Small fat containing umbilical hernia. Mild symmetric gynecomastia. Hypodense superficial subcutaneous 2.0 cm mass in the ventral medial right lower abdominal wall (series 2/image 84), nonspecific, potentially a sebaceous cyst. Musculoskeletal: No aggressive appearing focal osseous lesions. Moderate lumbar spondylosis. IMPRESSION: 1. Distended mildly thick-walled gallbladder filled with sludge and gallstones. Pericholecystic fat haziness. Acute cholecystitis not excluded. No biliary ductal dilatation. 2. Nonspecific wall thickening in the descending duodenum with periduodenal fat stranding. Findings could be reactive, although intrinsic duodenitis/peptic ulcer disease not excluded. 3. Mildly dilated mid small bowel loops with air-fluid levels, with no focal small bowel caliber transition. Fluid levels in the proximal and mid colon. Findings are more suggestive of a mild adynamic ileus. 4. Small volume perihepatic ascites. 5. Mild-to-moderate bibasilar atelectasis. 6.  Aortic Atherosclerosis (ICD10-I70.0).  Coronary atherosclerosis. Electronically Signed   By: Ilona Sorrel M.D.   On:  01/30/2018 18:59   US Abdomen Limited  Result Date: 01/30/2018 CLINICAL DATA:  Right upper quadrant pain for several days EXAM: ULTRASOUND ABDOMEN LIMITED RIGHT UPPER QUADRANT COMPARISON:  None. FINDINGS: Gallbladder: Gallbladder is well distended with echogenic sludge. The wall is difficult to distinguish but appears thickened in some areas. No definitive calculi are seen. Common bile duct: Diameter: 5 mm. Liver: Diffusely heterogeneous without focal mass. These changes likely represent fatty infiltration. Portal vein is patent on color Doppler imaging with normal direction of blood flow towards the liver. Minimal ascites is noted. IMPRESSION: Well distended gallbladder with diffuse echogenic sludge within. No definitive calculi are seen. Wall thickening is noted with a positive sonographic Murphy sign consistent with acute cholecystitis. Minimal ascites. Heterogeneous changes in the liver likely related to fatty infiltration. No mass is seen. Electronically Signed   By: Inez Catalina M.D.   On: 01/30/2018 16:58    Pending Labs Unresulted Labs (From admission, onward)   Start     Ordered   01/31/18 0500  Comprehensive metabolic panel  Tomorrow morning,   R     01/30/18 2045   01/31/18 0500  CBC WITH DIFFERENTIAL  Tomorrow morning,   R     01/30/18 2045   01/30/18 2035  Sodium, urine, random  Add-on,   R     01/30/18 2035   01/30/18 2035  Creatinine, urine, random  Add-on,   R     01/30/18 2035   01/30/18 2035  Urea nitrogen, urine  Add-on,   R     01/30/18 2035   01/30/18 2034  Culture, blood (routine x 2)  BLOOD CULTURE X 2,   R     01/30/18 2035      Vitals/Pain Today's Vitals   01/30/18 1729 01/30/18 1835 01/30/18 1948 01/30/18 2112  BP: (!) 155/64  117/70   Pulse: 92  92   Resp: 18  17   Temp:      TempSrc:      SpO2: 98%  93%   Weight:      Height:      PainSc:  5   0-No pain    Isolation Precautions No active isolations  Medications Medications  iopamidol (ISOVUE-300)  61 % injection (not administered)  fentaNYL (SUBLIMAZE)  injection 25-50 mcg (not administered)  famotidine (PEPCID) IVPB 20 mg premix (not administered)  0.9 %  sodium chloride infusion ( Intravenous New Bag/Given 01/30/18 2052)  ondansetron (ZOFRAN) injection 4 mg (not administered)  ketorolac (ACULAR) 0.5 % ophthalmic solution 1 drop (not administered)  timolol (TIMOPTIC) 0.5 % ophthalmic solution 1 drop (not administered)  heparin injection 5,000 Units (not administered)  acetaminophen (TYLENOL) tablet 650 mg (not administered)    Or  acetaminophen (TYLENOL) suppository 650 mg (not administered)  promethazine (PHENERGAN) tablet 12.5 mg (not administered)  insulin aspart (novoLOG) injection 0-9 Units (5 Units Subcutaneous Given 01/30/18 2109)  hydrALAZINE (APRESOLINE) injection 10 mg (not administered)  piperacillin-tazobactam (ZOSYN) IVPB 3.375 g (3.375 g Intravenous New Bag/Given 01/30/18 2112)  piperacillin-tazobactam (ZOSYN) IVPB 3.375 g (not administered)  sodium chloride 0.9 % bolus 1,000 mL (0 mLs Intravenous Stopped 01/30/18 1835)  HYDROmorphone (DILAUDID) injection 0.5 mg (0.5 mg Intravenous Given 01/30/18 1751)    Mobility walks

## 2018-01-30 NOTE — ED Provider Notes (Signed)
[facility]-EMERGENCY DEPT Provider Note   CSN: [id number] Arrival date & time: 01/30/18  1452     History   Chief Complaint Chief Complaint  Patient presents with  . Weakness  . Nausea  . Emesis    HPI Jeremiah [name] is a 72 y.o. male with a past medical history of diabetes, hypertension, PVD, who presents to ED for evaluation of 3-day history of generalized weakness, 2-3 episodes of NBNB emesis and nausea.  He has been having right upper quadrant abdominal pain that is sharp and radiates to his right lower quadrant.  Pain is intermittent.  He also reports decrease in bowel movements with the last normal bowel movement being yesterday.  Symptoms began after he ate a meal at [employer] with his wife.  States that his wife does not have any symptoms.  Denies any fevers, dysuria, hematochezia, melena, hematuria, URI symptoms, chest pain, SOB, prior abdominal surgeries.  HPI  Past Medical History:  Diagnosis Date  . Arthritis   . Diabetes mellitus without complication   . Hypertension   . Peripheral vascular disease     Patient Active Problem List   Diagnosis Date Noted  . PVD (peripheral vascular disease) (HCC) 09/05/2014    No past surgical history on file.     Home Medications    Prior to Admission medications   Medication Sig Start Date End Date Taking? Authorizing Provider  AMLODIPINE-VALSARTAN-HCTZ PO Take by mouth.   Yes [provider]  aspirin 81 MG tablet Take 81 mg by mouth daily.   Yes [provider]  glimepiride (AMARYL) 2 MG tablet Take 2 mg by mouth daily with breakfast.   Yes [provider]  metoprolol (LOPRESSOR) 50 MG tablet Take 50 mg by mouth 2 (two) times daily.   Yes [provider]  Multiple Vitamin (MULTIVITAMIN) tablet Take 1 tablet by mouth daily.   Yes [provider]  simvastatin (ZOCOR) 40 MG tablet Take 40 mg by mouth daily.   Yes [provider]    sitaGLIPtin (JANUVIA) 100 MG tablet Take 100 mg by mouth daily.   Yes [provider]  timolol (TIMOPTIC) 0.5 % ophthalmic solution Place 1 drop into both eyes 2 (two) times daily. 01/27/18   [provider]    Family History Family History  Problem Relation Age of Onset  . Diabetes Mother   . Hypertension Mother   . Heart disease Father   . Heart attack Father   . Diabetes Sister   . Hypertension Sister   . Diabetes Brother   . Heart disease Brother   . Hypertension Brother   . Heart attack Brother     Social History Social History   Tobacco Use  . Smoking status: Former Smoker    Last attempt to quit: 11/25/1983    Years since quitting: 34.2  Substance Use Topics  . Alcohol use: No  . Drug use: No     Allergies   Patient has no known allergies.   Review of Systems Review of Systems  Constitutional: Positive for fatigue. Negative for appetite change, chills and fever.  HENT: Negative for ear pain, rhinorrhea, sneezing and sore throat.   Eyes: Negative for photophobia and visual disturbance.  Respiratory: Negative for cough, chest tightness, shortness of breath and wheezing.   Cardiovascular: Negative for chest pain and palpitations.  Gastrointestinal: Positive for abdominal pain, nausea and vomiting. Negative for blood in stool, constipation and diarrhea.  Genitourinary: Negative  for dysuria, hematuria and urgency.  Musculoskeletal: Negative for myalgias.  Skin: Negative for rash.  Neurological: Negative for dizziness, weakness and light-headedness.     Physical Exam Updated Vital Signs BP (!) 155/64   Pulse 92   Temp (!) 97.5 F (36.4 C) (Oral)   Resp 18   Ht 5\' 9"  (1.753 m)   Wt 91.6 kg (202 lb)   SpO2 98%   BMI 29.83 kg/m   Physical Exam  Constitutional: He appears well-developed and well-nourished. No distress.  HENT:  Head: Normocephalic and atraumatic.  Nose: Nose normal.  Eyes: Conjunctivae and EOM are normal. Right eye  exhibits no discharge. Left eye exhibits no discharge. No scleral icterus.  Neck: Normal range of motion. Neck supple.  Cardiovascular: Normal rate, regular rhythm, normal heart sounds and intact distal pulses. Exam reveals no gallop and no friction rub.  No murmur heard. Pulmonary/Chest: Effort normal and breath sounds normal. No respiratory distress.  Abdominal: Soft. Bowel sounds are normal. He exhibits distension. There is tenderness in the right upper quadrant. There is positive Murphy's sign. There is no guarding.    Musculoskeletal: Normal range of motion. He exhibits no edema.  Neurological: He is alert. He exhibits normal muscle tone. Coordination normal.  Skin: Skin is warm and dry. No rash noted.  Psychiatric: He has a normal mood and affect.  Nursing note and vitals reviewed.    ED Treatments / Results  Labs (all labs ordered are listed, but only abnormal results are displayed) Labs Reviewed  COMPREHENSIVE METABOLIC PANEL - Abnormal; Notable for the following components:      Result Value   Chloride 100 (*)    Glucose, Bld 302 (*)    BUN 38 (*)    Creatinine, Ser 2.23 (*)    Albumin 2.8 (*)    AST 50 (*)    Total Bilirubin 1.3 (*)    GFR calc non Af Amer 28 (*)    GFR calc Af Amer 32 (*)    All other components within normal limits  CBC WITH DIFFERENTIAL/PLATELET - Abnormal; Notable for the following components:   WBC 22.6 (*)    RBC 4.19 (*)    Hemoglobin 12.6 (*)    HCT 36.5 (*)    Neutro Abs 18.2 (*)    Monocytes Absolute 3.3 (*)    All other components within normal limits  URINALYSIS, ROUTINE W REFLEX MICROSCOPIC - Abnormal; Notable for the following components:   Glucose, UA >=500 (*)    Hgb urine dipstick SMALL (*)    Protein, ur 100 (*)    Squamous Epithelial / LPF 0-5 (*)    All other components within normal limits  LIPASE, BLOOD  I-STAT TROPONIN, ED    EKG  EKG Interpretation  Date/Time:  Saturday January 30 2018 16:28:27 EST Ventricular  Rate:  92 PR Interval:    QRS Duration: 78 QT Interval:  330 QTC Calculation: 409 R Axis:   51 Text Interpretation:  Sinus rhythm Probable left atrial enlargement NO STEMI No old tracing to compare Confirmed by Drema Pry [phone]) on 01/30/2018 4:39:51 PM       Radiology Ct Abdomen Pelvis Wo Contrast  Result Date: 01/30/2018 CLINICAL DATA:  Nausea, vomiting, right lower quadrant pain and abdominal distention. EXAM: CT ABDOMEN AND PELVIS WITHOUT CONTRAST TECHNIQUE: Multidetector CT imaging of the abdomen and pelvis was performed following the standard protocol without IV contrast. COMPARISON:  01/30/2018 abdominal sonogram FINDINGS: Lower chest: Mild-to-moderate bibasilar atelectasis, right greater than  left. Three-vessel coronary atherosclerosis. Hepatobiliary: Normal liver size. No liver mass. Distended gallbladder is filled with sludge and gallstones measuring up to 0.9 cm. Mild diffuse gallbladder wall thickening with haziness of the pericholecystic fat. No biliary ductal dilatation. Pancreas: Normal, with no mass or duct dilation. Spleen: Normal size. No mass. Adrenals/Urinary Tract: Normal adrenals. No contour deforming renal masses. No renal stones. No hydronephrosis. Normal bladder. Stomach/Bowel: Normal non-distended stomach. Nonspecific mild wall thickening throughout descending duodenum with associated peri duodenal fat stranding. A few mildly dilated small bowel loops with associated fluid levels are noted in anterior abdomen measuring up to the 3.2 cm diameter. Distal small bowel is collapsed. No discrete small bowel caliber transition. No additional sites of small bowel wall thickening. Normal appendix. Mild scattered colonic diverticulosis. No large bowel wall thickening. Fluid levels throughout right and transverse colon. Vascular/Lymphatic: Atherosclerotic nonaneurysmal abdominal aorta. No pathologically enlarged lymph nodes in the abdomen or pelvis. Reproductive: Normal size prostate.  Other: No pneumoperitoneum. Small volume perihepatic ascites. No focal fluid collection. Small fat containing umbilical hernia. Mild symmetric gynecomastia. Hypodense superficial subcutaneous 2.0 cm mass in the ventral medial right lower abdominal wall (series 2/image 84), nonspecific, potentially a sebaceous cyst. Musculoskeletal: No aggressive appearing focal osseous lesions. Moderate lumbar spondylosis. IMPRESSION: 1. Distended mildly thick-walled gallbladder filled with sludge and gallstones. Pericholecystic fat haziness. Acute cholecystitis not excluded. No biliary ductal dilatation. 2. Nonspecific wall thickening in the descending duodenum with periduodenal fat stranding. Findings could be reactive, although intrinsic duodenitis/peptic ulcer disease not excluded. 3. Mildly dilated mid small bowel loops with air-fluid levels, with no focal small bowel caliber transition. Fluid levels in the proximal and mid colon. Findings are more suggestive of a mild adynamic ileus. 4. Small volume perihepatic ascites. 5. Mild-to-moderate bibasilar atelectasis. 6.  Aortic Atherosclerosis (ICD10-I70.0).  Coronary atherosclerosis. Electronically Signed   By: Delbert PhenixJason A Poff M.D.   On: 01/30/2018 18:59   [place] Abdomen Limited  Result Date: 01/30/2018 CLINICAL DATA:  Right upper quadrant pain for several days EXAM: ULTRASOUND ABDOMEN LIMITED RIGHT UPPER QUADRANT COMPARISON:  None. FINDINGS: Gallbladder: Gallbladder is well distended with echogenic sludge. The wall is difficult to distinguish but appears thickened in some areas. No definitive calculi are seen. Common bile duct: Diameter: 5 mm. Liver: Diffusely heterogeneous without focal mass. These changes likely represent fatty infiltration. Portal vein is patent on color Doppler imaging with normal direction of blood flow towards the liver. Minimal ascites is noted. IMPRESSION: Well distended gallbladder with diffuse echogenic sludge within. No definitive calculi are seen. Wall  thickening is noted with a positive sonographic Murphy sign consistent with acute cholecystitis. Minimal ascites. Heterogeneous changes in the liver likely related to fatty infiltration. No mass is seen. Electronically Signed   By: Alcide CleverMark  Lukens M.D.   On: 01/30/2018 16:58      EMERGENCY DEPARTMENT BILIARY ULTRASOUND INTERPRETATION "Study: Limited Abdominal Ultrasound of the Gallbladder and Common Bile Duct."  INDICATIONS: Abdominal pain Indication: Multiple views of the gallbladder and common bile duct were obtained in real-time with a Multi-frequency probe."  PERFORMED BY:  Myself and Other  IMAGES ARCHIVED?: Yes LIMITATIONS: Body habitus INTERPRETATION: Unable to fully visualize   Procedures Procedures (including critical care time)  Medications Ordered in ED Medications  iopamidol (ISOVUE-300) 61 % injection (not administered)  sodium chloride 0.9 % bolus 1,000 mL (0 mLs Intravenous Stopped 01/30/18 1835)  HYDROmorphone (DILAUDID) injection 0.5 mg (0.5 mg Intravenous Given 01/30/18 1751)     Initial Impression / Assessment and Plan /  ED Course  I have reviewed the triage vital signs and the nursing notes.  Pertinent labs & imaging results that were available during my care of the patient were reviewed by me and considered in my medical decision making (see chart for details).     Patient presents to ED for evaluation of 3-day history of generalized weakness, 2-3 episodes of emesis and nausea.  He also reports right upper quadrant abdominal pain.  On physical exam he is tender to palpation of the right upper quadrant with positive Murphy sign.  Bedside ultrasound did not reveal any definitive gallbladder etiology.  Lab work significant for leukocytosis at 22.6, increased BUN and creatinine to 38 and 2.23 as well as a mild elevation in AST.  CT and ultrasound findings both concerning for cholecystitis with gallbladder sludge.  Spoke to surgery who will see the patient tomorrow.  Will  admit to hospitalist for further evaluation. Patient discussed with and seen by Dr. Eudelia Bunch.  Final Clinical Impressions(s) / ED Diagnoses   Final diagnoses:  None    ED Discharge Orders    None       Dietrich Pates, PA-C 01/30/18 2009

## 2018-01-30 NOTE — ED Triage Notes (Signed)
Per EMS, patient comes from home. C/o N/V and weakness for last 3 days. No diarrhea. Abdomen distended, RLQ pain. Usually walks with cane, but now cant walk as far. Ate dinner out Thursday and that's when noticed the symptoms.

## 2018-01-30 NOTE — Progress Notes (Signed)
Pharmacy Antibiotic Note  Jeremiah [name] is a 72 y.o. male admitted on 01/30/2018 with acute cholecystitis.  Surgery consulted.  Pharmacy has been consulted for Zosyn dosing.  Plan: Zosyn 3.375g IV q8h (4 hour infusion).  F/U cultures & sensitivities  Height: 5\' 9"  (175.3 cm) Weight: 202 lb (91.6 kg) IBW/kg (Calculated) : 70.7  Temp (24hrs), Avg:97.5 F (36.4 C), Min:97.5 F (36.4 C), Max:97.5 F (36.4 C)  Recent Labs  Lab 01/30/18 1547  WBC 22.6*  CREATININE 2.23*    Estimated Creatinine Clearance: 33 mL/min (A) (by C-G formula based on SCr of 2.23 mg/dL (H)).    No Known Allergies  Antimicrobials this admission: 3/9 Zosyn >>    Dose adjustments this admission:    Microbiology results: 3/9 BCx: sent  Thank you for allowing pharmacy to be a part of this patient's care.  Maryellen PilePoindexter, Jaide Hillenburg Trefz, PharmD 01/30/2018 9:00 PM

## 2018-01-30 NOTE — ED Notes (Signed)
Report given to receiving RN.

## 2018-01-30 NOTE — ED Notes (Signed)
Attempted to call report to receiving RN. Receiving RN will call back when able to take report .

## 2018-01-30 NOTE — ED Notes (Signed)
Bed: [id number] Expected date:  Expected time:  Means of arrival:  Comments: 72 yo N/V, weakness

## 2018-01-30 NOTE — ED Provider Notes (Signed)
Medical screening examination/treatment/procedure(s) were conducted as a shared visit with non-physician practitioner(s) and myself.  I personally evaluated the patient during the encounter. Briefly, the patient is a 72 y.o. male here with several days of right sided abdominal pain with nausea, but no emesis. Last BM 2 days ago, but still passing gas. POCUS RUQ difficult to visualize the GB (findings maybe concerning for cholangiocarcinoma). Will get CT and formal US for better evaluation.  Workup revealed evidence of acute cholecystitis with sludging.  Surgery consulted who will follow along.  Patient admitted to medicine for further management.   EKG Interpretation  Date/Time:  Saturday January 30 2018 16:28:27 EST Ventricular Rate:  92 PR Interval:    QRS Duration: 78 QT Interval:  330 QTC Calculation: 409 R Axis:   51 Text Interpretation:  Sinus rhythm Probable left atrial enlargement NO STEMI No old tracing to compare Confirmed by Drema Pryardama, Platon Arocho [phone]) on 01/30/2018 4:39:51 PM         EMERGENCY DEPARTMENT BILIARY ULTRASOUND INTERPRETATION "Study: Limited Abdominal Ultrasound of the Gallbladder and Common Bile Duct."  INDICATIONS: Abdominal pain Indication: Multiple views of the gallbladder and common bile duct were obtained in real-time with a Multi-frequency probe."  PERFORMED BY:  Myself and Hina Khatri, PA IMAGES ARCHIVED?: Yes LIMITATIONS: Body habitus INTERPRETATION: Difficult to visualize gallbladder either due to gallbladder full of stones or possible cholangiocarcinoma     Sianne Tejada, Amadeo GarnetPedro Eduardo, MD 01/31/18 0020

## 2018-01-30 NOTE — Consult Note (Signed)
Reason for Consult:abd pain Referring Physician: Dr. Harrold Donath is an 72 y.o. male.  HPI: The patient is a 72 year old black male who presents today with a 5-day history of right upper quadrant pain.  He states that about Tuesday or Wednesday he ate a steak that seem to not agree with him.  At that time he started developing right upper quadrant pain.  The pain has worsened throughout the week until he came to the emergency department.  He had an episode of vomiting earlier.  He denies any fevers or chills.  A CT scan done in the emergency department did show significant inflammation of the gallbladder wall with some small stones.  His liver functions are only slightly elevated.  His white count is 22,000.  Past Medical History:  Diagnosis Date  . Arthritis   . Diabetes mellitus without complication ([place])   . Hypertension   . Peripheral vascular disease ([place])     History reviewed. No pertinent surgical history.  Family History  Problem Relation Age of Onset  . Diabetes Mother   . Hypertension Mother   . Heart disease Father   . Heart attack Father   . Diabetes Sister   . Hypertension Sister   . Diabetes Brother   . Heart disease Brother   . Hypertension Brother   . Heart attack Brother     Social History:  reports that he quit smoking about 34 years ago. He does not have any smokeless tobacco history on file. He reports that he does not drink alcohol or use drugs.  Allergies: No Known Allergies  Medications: I have reviewed the patient's current medications.  Results for orders placed or performed during the hospital encounter of 01/30/18 (from the past 48 hour(s))  Comprehensive metabolic panel     Status: Abnormal   Collection Time: 01/30/18  3:47 PM  Result Value Ref Range   Sodium 136 135 - 145 mmol/L   Potassium 4.4 3.5 - 5.1 mmol/L   Chloride 100 (L) 101 - 111 mmol/L   CO2 24 22 - 32 mmol/L   Glucose, Bld 302 (H) 65 - 99 mg/dL   BUN 38 (H) 6 - 20 mg/dL    Creatinine, Ser 2.23 (H) 0.61 - 1.24 mg/dL   Calcium 8.9 8.9 - 10.3 mg/dL   Total Protein 8.1 6.5 - 8.1 g/dL   Albumin 2.8 (L) 3.5 - 5.0 g/dL   AST 50 (H) 15 - 41 U/L   ALT 53 17 - 63 U/L   Alkaline Phosphatase 112 38 - 126 U/L   Total Bilirubin 1.3 (H) 0.3 - 1.2 mg/dL   GFR calc non Af Amer 28 (L) >60 mL/min   GFR calc Af Amer 32 (L) >60 mL/min    Comment: (NOTE) The eGFR has been calculated using the CKD EPI equation. This calculation has not been validated in all clinical situations. eGFR's persistently <60 mL/min signify possible Chronic Kidney Disease.    Anion gap 12 5 - 15    Comment: Performed at [facility], [place] [address], [place], [place] [address]  Lipase, blood     Status: None   Collection Time: 01/30/18  3:47 PM  Result Value Ref Range   Lipase 20 11 - 51 U/L    Comment: Performed at [facility], [place] [address], [place], [place] [address]  CBC with Differential     Status: Abnormal   Collection Time: 01/30/18  3:47 PM  Result Value  Ref Range   WBC 22.6 (H) 4.0 - 10.5 K/uL   RBC 4.19 (L) 4.22 - 5.81 MIL/uL   Hemoglobin 12.6 (L) 13.0 - 17.0 g/dL   HCT 36.5 (L) 39.0 - 52.0 %   MCV 87.1 78.0 - 100.0 fL   MCH 30.1 26.0 - 34.0 pg   MCHC 34.5 30.0 - 36.0 g/dL   RDW 14.9 11.5 - 15.5 %   Platelets 241 150 - 400 K/uL   Neutrophils Relative % 80 %   Neutro Abs 18.2 (H) 1.7 - 7.7 K/uL   Lymphocytes Relative 5 %   Lymphs Abs 1.1 0.7 - 4.0 K/uL   Monocytes Relative 15 %   Monocytes Absolute 3.3 (H) 0.1 - 1.0 K/uL   Eosinophils Relative 0 %   Eosinophils Absolute 0.0 0.0 - 0.7 K/uL   Basophils Relative 0 %   Basophils Absolute 0.0 0.0 - 0.1 K/uL    Comment: Performed at [facility], [place] [address] [place], [place] [address]  Urinalysis, Routine w reflex microscopic     Status: Abnormal   Collection Time: 01/30/18  3:47 PM  Result Value Ref Range   Color, Urine YELLOW YELLOW   APPearance CLEAR CLEAR    Specific Gravity, Urine 1.023 1.005 - 1.030   pH 5.0 5.0 - 8.0   Glucose, UA >=500 (A) NEGATIVE mg/dL   Hgb urine dipstick SMALL (A) NEGATIVE   Bilirubin Urine NEGATIVE NEGATIVE   Ketones, ur NEGATIVE NEGATIVE mg/dL   Protein, ur 100 (A) NEGATIVE mg/dL   Nitrite NEGATIVE NEGATIVE   Leukocytes, UA NEGATIVE NEGATIVE   RBC / HPF 0-5 0 - 5 RBC/hpf   WBC, UA 0-5 0 - 5 WBC/hpf   Bacteria, UA NONE SEEN NONE SEEN   Squamous Epithelial / LPF 0-5 (A) NONE SEEN    Comment: Performed at [facility], [place] [address], [place], [place] [address]  I-stat troponin, ED     Status: None   Collection Time: 01/30/18  4:03 PM  Result Value Ref Range   Troponin i, poc 0.00 0.00 - 0.08 ng/mL   Comment 3            Comment: Due to the release kinetics of cTnI, a negative result within the first hours of the onset of symptoms does not rule out myocardial infarction with certainty. If myocardial infarction is still suspected, repeat the test at appropriate intervals.   CBG monitoring, ED     Status: Abnormal   Collection Time: 01/30/18  8:59 PM  Result Value Ref Range   Glucose-Capillary 251 (H) 65 - 99 mg/dL    Ct Abdomen Pelvis Wo Contrast  Result Date: 01/30/2018 CLINICAL DATA:  Nausea, vomiting, right lower quadrant pain and abdominal distention. EXAM: CT ABDOMEN AND PELVIS WITHOUT CONTRAST TECHNIQUE: Multidetector CT imaging of the abdomen and pelvis was performed following the standard protocol without IV contrast. COMPARISON:  01/30/2018 abdominal sonogram FINDINGS: Lower chest: Mild-to-moderate bibasilar atelectasis, right greater than left. Three-vessel coronary atherosclerosis. Hepatobiliary: Normal liver size. No liver mass. Distended gallbladder is filled with sludge and gallstones measuring up to 0.9 cm. Mild diffuse gallbladder wall thickening with haziness of the pericholecystic fat. No biliary ductal dilatation. Pancreas: Normal, with no mass or duct dilation. Spleen: Normal  size. No mass. Adrenals/Urinary Tract: Normal adrenals. No contour deforming renal masses. No renal stones. No hydronephrosis. Normal bladder. Stomach/Bowel: Normal non-distended stomach. Nonspecific mild wall thickening throughout descending duodenum with associated peri duodenal fat stranding. A few  mildly dilated small bowel loops with associated fluid levels are noted in anterior abdomen measuring up to the 3.2 cm diameter. Distal small bowel is collapsed. No discrete small bowel caliber transition. No additional sites of small bowel wall thickening. Normal appendix. Mild scattered colonic diverticulosis. No large bowel wall thickening. Fluid levels throughout right and transverse colon. Vascular/Lymphatic: Atherosclerotic nonaneurysmal abdominal aorta. No pathologically enlarged lymph nodes in the abdomen or pelvis. Reproductive: Normal size prostate. Other: No pneumoperitoneum. Small volume perihepatic ascites. No focal fluid collection. Small fat containing umbilical hernia. Mild symmetric gynecomastia. Hypodense superficial subcutaneous 2.0 cm mass in the ventral medial right lower abdominal wall (series 2/image 84), nonspecific, potentially a sebaceous cyst. Musculoskeletal: No aggressive appearing focal osseous lesions. Moderate lumbar spondylosis. IMPRESSION: 1. Distended mildly thick-walled gallbladder filled with sludge and gallstones. Pericholecystic fat haziness. Acute cholecystitis not excluded. No biliary ductal dilatation. 2. Nonspecific wall thickening in the descending duodenum with periduodenal fat stranding. Findings could be reactive, although intrinsic duodenitis/peptic ulcer disease not excluded. 3. Mildly dilated mid small bowel loops with air-fluid levels, with no focal small bowel caliber transition. Fluid levels in the proximal and mid colon. Findings are more suggestive of a mild adynamic ileus. 4. Small volume perihepatic ascites. 5. Mild-to-moderate bibasilar atelectasis. 6.  Aortic  Atherosclerosis (ICD10-I70.0).  Coronary atherosclerosis. Electronically Signed   By: Ilona Sorrel M.D.   On: 01/30/2018 18:59   US Abdomen Limited  Result Date: 01/30/2018 CLINICAL DATA:  Right upper quadrant pain for several days EXAM: ULTRASOUND ABDOMEN LIMITED RIGHT UPPER QUADRANT COMPARISON:  None. FINDINGS: Gallbladder: Gallbladder is well distended with echogenic sludge. The wall is difficult to distinguish but appears thickened in some areas. No definitive calculi are seen. Common bile duct: Diameter: 5 mm. Liver: Diffusely heterogeneous without focal mass. These changes likely represent fatty infiltration. Portal vein is patent on color Doppler imaging with normal direction of blood flow towards the liver. Minimal ascites is noted. IMPRESSION: Well distended gallbladder with diffuse echogenic sludge within. No definitive calculi are seen. Wall thickening is noted with a positive sonographic Murphy sign consistent with acute cholecystitis. Minimal ascites. Heterogeneous changes in the liver likely related to fatty infiltration. No mass is seen. Electronically Signed   By: Inez Catalina M.D.   On: 01/30/2018 16:58    Review of Systems  Constitutional: Negative.   HENT: Negative.   Eyes: Negative.   Respiratory: Negative.   Cardiovascular: Negative.   Gastrointestinal: Positive for abdominal pain, nausea and vomiting.  Genitourinary: Negative.   Musculoskeletal: Negative.   Skin: Negative.   Neurological: Negative.   Endo/Heme/Allergies: Negative.   Psychiatric/Behavioral: Negative.    Blood pressure 117/70, pulse 92, temperature (!) 97.5 F (36.4 C), temperature source Oral, resp. rate 17, height '5\' 9"'$  (1.753 m), weight 91.6 kg (202 lb), SpO2 93 %. Physical Exam  Constitutional: He is oriented to person, place, and time. He appears well-developed and well-nourished. No distress.  HENT:  Head: Normocephalic and atraumatic.  Mouth/Throat: No oropharyngeal exudate.  Eyes: Conjunctivae  and EOM are normal. Pupils are equal, round, and reactive to light.  Neck: Normal range of motion. Neck supple.  Cardiovascular: Normal rate, regular rhythm and normal heart sounds.  Respiratory: Effort normal and breath sounds normal. No stridor. No respiratory distress.  GI: Soft. Bowel sounds are normal. There is tenderness.  Musculoskeletal: Normal range of motion. He exhibits no edema or tenderness.  Neurological: He is alert and oriented to person, place, and time.  Skin: Skin is warm and  dry. No erythema.  Psychiatric: He has a normal mood and affect. His behavior is normal. Thought content normal.    Assessment/Plan: The patient appears to have significant cholecystitis that is been going on almost 5 days.  Given the length of time that this is been occurring in his age and comorbid conditions I think he may benefit more from percutaneous drainage of the gallbladder.  We will start him on IV antibiotics and hold any blood thinners for now.  If the percutaneous drainage is successful then we would plan to cool him off for 6-8 weeks with the drain and then plan for an interval cholecystectomy.  I think this is likely to have less blood loss and be more likely to be accomplished laparoscopically for him.  I have discussed this with him and he is in agreement with this plan.  We will follow him closely with you.  I have consulted interventional radiology.  TOTH III,PAUL S 01/30/2018, 9:15 PM

## 2018-01-30 NOTE — H&P (Signed)
History and Physical    Jeremiah [name] ZOX:[id number] DOB: 05-07-1946 DOA: 01/30/2018  PCP: Donia Guiles, MD   Patient coming from: Home  Chief Complaint: Right-sided abd pain, N/V, loss of appetite   HPI: Jeremiah [name] is a 72 y.o. male with medical history significant for hypertension and type 2 diabetes mellitus, now presenting to the emergency department for evaluation of abdominal pain with nausea and nonbloody vomiting.  Patient reports that he been in his usual state of health until shortly after eating dinner 3 days ago, when he developed severe pain in the right side of his abdomen with nausea.  He went on to have numerous episodes of nonbloody vomiting and has had loss of appetite.  Pain is severe, constant, waxing and waning in severity, and localized to the right side of the abdomen.  Symptoms have persisted, prompting him to seek evaluation in the ED.  He had never experienced similar symptoms previously.  He denies fevers, chest pain, cough, or shortness of breath.  No diarrhea, melena, or hematochezia.  ED Course: Upon arrival to the ED, patient is found to be afebrile, saturating low 90s on room air, and with vitals otherwise normal.  EKG features a sinus rhythm.  Chemistry panel is notable for a serum creatinine of 2.23, up from 0.83 in 2010.  There is slight elevation in AST and total bilirubin.  CBC is notable for leukocytosis to 22,600 and a mild normocytic anemia.  Troponin is undetectable.  Patient was treated with 1 L of normal saline and IV Dilaudid in the ED.  Surgery was consulted by the ED physician and plans to evaluate the patient.  Pain has improved with analgesia in the ED, patient remains hemodynamically stable, is in no apparent respiratory distress, and will be admitted to the medical-surgical unit for ongoing evaluation and management of severe right-sided abdominal pain with nausea and vomiting, suspected secondary to acute cholecystitis.  Review of Systems:    All other systems reviewed and apart from HPI, are negative.  Past Medical History:  Diagnosis Date  . Arthritis   . Diabetes mellitus without complication ([facility])   . Hypertension   . Peripheral vascular disease ([facility])     History reviewed. No pertinent surgical history.   reports that he quit smoking about 34 years ago. He does not have any smokeless tobacco history on file. He reports that he does not drink alcohol or use drugs.  No Known Allergies  Family History  Problem Relation Age of Onset  . Diabetes Mother   . Hypertension Mother   . Heart disease Father   . Heart attack Father   . Diabetes Sister   . Hypertension Sister   . Diabetes Brother   . Heart disease Brother   . Hypertension Brother   . Heart attack Brother      Prior to Admission medications   Medication Sig Start Date End Date Taking? Authorizing Provider  AMLODIPINE-VALSARTAN-HCTZ PO Take by mouth.   Yes [provider]  aspirin 81 MG tablet Take 81 mg by mouth daily.   Yes [provider]  glimepiride (AMARYL) 2 MG tablet Take 2 mg by mouth daily with breakfast.   Yes [provider]  JARDIANCE 10 MG TABS tablet Take 10 mg by mouth every morning. 01/04/18  Yes [provider]  losartan (COZAAR) 100 MG tablet Take 100 mg by mouth daily. 01/27/18  Yes [provider]  metoprolol (LOPRESSOR) 50 MG tablet Take 50 mg by  mouth 2 (two) times daily.   Yes [provider]  Multiple Vitamin (MULTIVITAMIN) tablet Take 1 tablet by mouth daily.   Yes [provider]  pioglitazone (ACTOS) 30 MG tablet Take 30 mg by mouth daily. 01/07/18  Yes [provider]  PROLENSA 0.07 % SOLN PLACE 1 DROP IN LEFT EYE AT BEDTIME 12/15/17  Yes [provider]  simvastatin (ZOCOR) 40 MG tablet Take 40 mg by mouth daily.   Yes [provider]  sitaGLIPtin (JANUVIA) 100 MG tablet Take 100 mg by mouth daily.   Yes [provider]  timolol  (TIMOPTIC) 0.5 % ophthalmic solution Place 1 drop into both eyes 2 (two) times daily. 01/27/18  Yes [provider]    Physical Exam: Vitals:   01/30/18 1504 01/30/18 1506 01/30/18 1729 01/30/18 1948  BP: (!) 157/70  (!) 155/64 117/70  Pulse: 89  92 92  Resp: 16  18 17   Temp: (!) 97.5 F (36.4 C)     TempSrc: Oral     SpO2: 96%  98% 93%  Weight:  91.6 kg (202 lb)    Height:  5\' 9"  (1.753 m)        Constitutional: NAD, calm  Eyes: PERTLA, lids and conjunctivae normal ENMT: Mucous membranes are moist. Posterior pharynx clear of any exudate or lesions.   Neck: normal, supple, no masses, no thyromegaly Respiratory: clear to auscultation bilaterally, no wheezing, no crackles. Normal respiratory effort.   Cardiovascular: S1 & S2 heard, regular rate and rhythm. No significant JVD. Abdomen: mild distension, tender throughout right abdomen without rebound pain or guarding. Bowel sounds appreciated.   Musculoskeletal: no clubbing / cyanosis. No joint deformity upper and lower extremities.  Skin: no significant rashes, lesions, ulcers. Warm, dry, well-perfused. Neurologic: CN 2-12 grossly intact. Sensation intact. Strength 5/5 in all 4 limbs.  Psychiatric:  Alert and oriented x 3. Calm, cooperative.     Labs on Admission: I have personally reviewed following labs and imaging studies  CBC: Recent Labs  Lab 01/30/18 1547  WBC 22.6*  NEUTROABS 18.2*  HGB 12.6*  HCT 36.5*  MCV 87.1  PLT 241   Basic Metabolic Panel: Recent Labs  Lab 01/30/18 1547  NA 136  K 4.4  CL 100*  CO2 24  GLUCOSE 302*  BUN 38*  CREATININE 2.23*  CALCIUM 8.9   GFR: Estimated Creatinine Clearance: 33 mL/min (A) (by C-G formula based on SCr of 2.23 mg/dL (H)). Liver Function Tests: Recent Labs  Lab 01/30/18 1547  AST 50*  ALT 53  ALKPHOS 112  BILITOT 1.3*  PROT 8.1  ALBUMIN 2.8*   Recent Labs  Lab 01/30/18 1547  LIPASE 20   No results for input(s): AMMONIA in the last 168  hours. Coagulation Profile: No results for input(s): INR, PROTIME in the last 168 hours. Cardiac Enzymes: No results for input(s): CKTOTAL, CKMB, CKMBINDEX, TROPONINI in the last 168 hours. BNP (last 3 results) No results for input(s): PROBNP in the last 8760 hours. HbA1C: No results for input(s): HGBA1C in the last 72 hours. CBG: No results for input(s): GLUCAP in the last 168 hours. Lipid Profile: No results for input(s): CHOL, HDL, LDLCALC, TRIG, CHOLHDL, LDLDIRECT in the last 72 hours. Thyroid Function Tests: No results for input(s): TSH, T4TOTAL, FREET4, T3FREE, THYROIDAB in the last 72 hours. Anemia Panel: No results for input(s): VITAMINB12, FOLATE, FERRITIN, TIBC, IRON, RETICCTPCT in the last 72 hours. Urine analysis:    Component Value Date/Time   COLORURINE YELLOW  01/30/2018 1547   APPEARANCEUR CLEAR 01/30/2018 1547   LABSPEC 1.023 01/30/2018 1547   PHURINE 5.0 01/30/2018 1547   GLUCOSEU >=500 (A) 01/30/2018 1547   HGBUR SMALL (A) 01/30/2018 1547   BILIRUBINUR NEGATIVE 01/30/2018 1547   KETONESUR NEGATIVE 01/30/2018 1547   PROTEINUR 100 (A) 01/30/2018 1547   UROBILINOGEN 0.2 01/09/2009 1532   NITRITE NEGATIVE 01/30/2018 1547   LEUKOCYTESUR NEGATIVE 01/30/2018 1547   Sepsis Labs: @LABRCNTIP (procalcitonin:4,lacticidven:4) )No results found for this or any previous visit (from the past 240 hour(s)).   Radiological Exams on Admission: Ct Abdomen Pelvis Wo Contrast  Result Date: 01/30/2018 CLINICAL DATA:  Nausea, vomiting, right lower quadrant pain and abdominal distention. EXAM: CT ABDOMEN AND PELVIS WITHOUT CONTRAST TECHNIQUE: Multidetector CT imaging of the abdomen and pelvis was performed following the standard protocol without IV contrast. COMPARISON:  01/30/2018 abdominal sonogram FINDINGS: Lower chest: Mild-to-moderate bibasilar atelectasis, right greater than left. Three-vessel coronary atherosclerosis. Hepatobiliary: Normal liver size. No liver mass. Distended  gallbladder is filled with sludge and gallstones measuring up to 0.9 cm. Mild diffuse gallbladder wall thickening with haziness of the pericholecystic fat. No biliary ductal dilatation. Pancreas: Normal, with no mass or duct dilation. Spleen: Normal size. No mass. Adrenals/Urinary Tract: Normal adrenals. No contour deforming renal masses. No renal stones. No hydronephrosis. Normal bladder. Stomach/Bowel: Normal non-distended stomach. Nonspecific mild wall thickening throughout descending duodenum with associated peri duodenal fat stranding. A few mildly dilated small bowel loops with associated fluid levels are noted in anterior abdomen measuring up to the 3.2 cm diameter. Distal small bowel is collapsed. No discrete small bowel caliber transition. No additional sites of small bowel wall thickening. Normal appendix. Mild scattered colonic diverticulosis. No large bowel wall thickening. Fluid levels throughout right and transverse colon. Vascular/Lymphatic: Atherosclerotic nonaneurysmal abdominal aorta. No pathologically enlarged lymph nodes in the abdomen or pelvis. Reproductive: Normal size prostate. Other: No pneumoperitoneum. Small volume perihepatic ascites. No focal fluid collection. Small fat containing umbilical hernia. Mild symmetric gynecomastia. Hypodense superficial subcutaneous 2.0 cm mass in the ventral medial right lower abdominal wall (series 2/image 84), nonspecific, potentially a sebaceous cyst. Musculoskeletal: No aggressive appearing focal osseous lesions. Moderate lumbar spondylosis. IMPRESSION: 1. Distended mildly thick-walled gallbladder filled with sludge and gallstones. Pericholecystic fat haziness. Acute cholecystitis not excluded. No biliary ductal dilatation. 2. Nonspecific wall thickening in the descending duodenum with periduodenal fat stranding. Findings could be reactive, although intrinsic duodenitis/peptic ulcer disease not excluded. 3. Mildly dilated mid small bowel loops with  air-fluid levels, with no focal small bowel caliber transition. Fluid levels in the proximal and mid colon. Findings are more suggestive of a mild adynamic ileus. 4. Small volume perihepatic ascites. 5. Mild-to-moderate bibasilar atelectasis. 6.  Aortic Atherosclerosis (ICD10-I70.0).  Coronary atherosclerosis. Electronically Signed   By: Delbert Phenix M.D.   On: 01/30/2018 18:59   US Abdomen Limited  Result Date: 01/30/2018 CLINICAL DATA:  Right upper quadrant pain for several days EXAM: ULTRASOUND ABDOMEN LIMITED RIGHT UPPER QUADRANT COMPARISON:  None. FINDINGS: Gallbladder: Gallbladder is well distended with echogenic sludge. The wall is difficult to distinguish but appears thickened in some areas. No definitive calculi are seen. Common bile duct: Diameter: 5 mm. Liver: Diffusely heterogeneous without focal mass. These changes likely represent fatty infiltration. Portal vein is patent on color Doppler imaging with normal direction of blood flow towards the liver. Minimal ascites is noted. IMPRESSION: Well distended gallbladder with diffuse echogenic sludge within. No definitive calculi are seen. Wall thickening is noted with a positive sonographic Eulah Pont  sign consistent with acute cholecystitis. Minimal ascites. Heterogeneous changes in the liver likely related to fatty infiltration. No mass is seen. Electronically Signed   By: Alcide Clever M.D.   On: 01/30/2018 16:58    EKG: Independently reviewed. Sinus rhythm.   Assessment/Plan  1. Acute cholecystitis  - Presents with 3 days of right-sided abd pain, anorexia, and nausea with non-bloody vomiting  - Found to be afebrile with significant leukocytosis, AKI, and imaging-findings consistent with acute cholecystitis  - Surgery is consulting and much appreciated  - Treated in ED with 1 liter NS and IV Dilaudid  - Culture, start empiric Zosyn, continue supportive care with IVF hydration, prn analgesia, and prn antiemetics    2. Acute kidney injury  -  SCr is 2.23 on admission, up from 0.8 on most recent labs from 2010  - No hydronephrosis on CT  - Likely acute in setting of N/V and anorexia  - Treated in ED with 1 liter NS and will be continued on NS infusion  - Renally-dose medications, avoid nephrotoxins, repeat chem panel in am   3. Type II DM  - No A1c on file  - He will be NPO as above  - Follow q4h CBG and start SSI with Novolog   4. Hypertension  - BP at goal  - Hold oral antihypertensives and use IV hydralazine prn for now     DVT prophylaxis: sq heparin  Code Status: Full  Family Communication: Wife and son updated at bedside Consults called: Surgery Admission status: Inpatient    Briscoe Deutscher, MD [facility] Pager [phone]  If 7PM-7AM, please contact night-coverage [email or web address] Password [email or web address]  01/30/2018, 8:45 PM

## 2018-01-31 ENCOUNTER — Encounter (HOSPITAL_COMMUNITY): Payer: Self-pay | Admitting: Interventional Radiology

## 2018-01-31 ENCOUNTER — Inpatient Hospital Stay (HOSPITAL_COMMUNITY): Payer: Medicare Other

## 2018-01-31 ENCOUNTER — Other Ambulatory Visit: Payer: Self-pay

## 2018-01-31 HISTORY — PX: IR PERC CHOLECYSTOSTOMY: IMG2326

## 2018-01-31 LAB — CBC WITH DIFFERENTIAL/PLATELET
Basophils Absolute: 0 10*3/uL (ref 0.0–0.1)
Basophils Relative: 0 %
Eosinophils Absolute: 0.1 10*3/uL (ref 0.0–0.7)
Eosinophils Relative: 1 %
HCT: 33.6 % — ABNORMAL LOW (ref 39.0–52.0)
Hemoglobin: 11.1 g/dL — ABNORMAL LOW (ref 13.0–17.0)
Lymphocytes Relative: 7 %
Lymphs Abs: 1.3 10*3/uL (ref 0.7–4.0)
MCH: 28.9 pg (ref 26.0–34.0)
MCHC: 33 g/dL (ref 30.0–36.0)
MCV: 87.5 fL (ref 78.0–100.0)
Monocytes Absolute: 2.4 10*3/uL — ABNORMAL HIGH (ref 0.1–1.0)
Monocytes Relative: 14 %
Neutro Abs: 13.3 10*3/uL — ABNORMAL HIGH (ref 1.7–7.7)
Neutrophils Relative %: 78 %
Platelets: 229 10*3/uL (ref 150–400)
RBC: 3.84 MIL/uL — ABNORMAL LOW (ref 4.22–5.81)
RDW: 15 % (ref 11.5–15.5)
WBC: 17 10*3/uL — ABNORMAL HIGH (ref 4.0–10.5)

## 2018-01-31 LAB — COMPREHENSIVE METABOLIC PANEL
ALT: 38 U/L (ref 17–63)
AST: 26 U/L (ref 15–41)
Albumin: 2.4 g/dL — ABNORMAL LOW (ref 3.5–5.0)
Alkaline Phosphatase: 95 U/L (ref 38–126)
Anion gap: 8 (ref 5–15)
BUN: 39 mg/dL — ABNORMAL HIGH (ref 6–20)
CO2: 23 mmol/L (ref 22–32)
Calcium: 8.2 mg/dL — ABNORMAL LOW (ref 8.9–10.3)
Chloride: 106 mmol/L (ref 101–111)
Creatinine, Ser: 1.95 mg/dL — ABNORMAL HIGH (ref 0.61–1.24)
GFR calc Af Amer: 38 mL/min — ABNORMAL LOW (ref >60)
GFR calc non Af Amer: 32 mL/min — ABNORMAL LOW (ref >60)
Glucose, Bld: 133 mg/dL — ABNORMAL HIGH (ref 65–99)
Potassium: 3.3 mmol/L — ABNORMAL LOW (ref 3.5–5.1)
Sodium: 137 mmol/L (ref 135–145)
Total Bilirubin: 1 mg/dL (ref 0.3–1.2)
Total Protein: 6.9 g/dL (ref 6.5–8.1)

## 2018-01-31 LAB — PROTIME-INR
INR: 1.01
Prothrombin Time: 13.2 seconds (ref 11.4–15.2)

## 2018-01-31 LAB — GLUCOSE, CAPILLARY
Glucose-Capillary: 132 mg/dL — ABNORMAL HIGH (ref 65–99)
Glucose-Capillary: 135 mg/dL — ABNORMAL HIGH (ref 65–99)
Glucose-Capillary: 180 mg/dL — ABNORMAL HIGH (ref 65–99)
Glucose-Capillary: 183 mg/dL — ABNORMAL HIGH (ref 65–99)
Glucose-Capillary: 261 mg/dL — ABNORMAL HIGH (ref 65–99)

## 2018-01-31 MED ORDER — POTASSIUM CHLORIDE IN NACL 40-0.9 MEQ/L-% IV SOLN
INTRAVENOUS | Status: DC
  Administered 2018-01-31 – 2018-02-02 (×4): 100 mL/h via INTRAVENOUS
  Filled 2018-01-31 (×5): qty 1000

## 2018-01-31 MED ORDER — FENTANYL CITRATE (PF) 100 MCG/2ML IJ SOLN
INTRAMUSCULAR | Status: AC
  Filled 2018-01-31: qty 4

## 2018-01-31 MED ORDER — PANTOPRAZOLE SODIUM 40 MG PO TBEC: 40.0000 mg | DELAYED_RELEASE_TABLET | Freq: Every day | ORAL | Status: DC

## 2018-01-31 MED ORDER — LIDOCAINE HCL 1 % IJ SOLN
INTRAMUSCULAR | Status: AC
  Filled 2018-01-31: qty 20

## 2018-01-31 MED ORDER — IOPAMIDOL (ISOVUE-300) INJECTION 61%
INTRAVENOUS | Status: AC
  Filled 2018-01-31: qty 50

## 2018-01-31 MED ORDER — AMLODIPINE BESYLATE 10 MG PO TABS
10.0000 mg | ORAL_TABLET | Freq: Every day | ORAL | Status: DC
  Administered 2018-01-31 – 2018-02-02 (×3): 10 mg via ORAL
  Filled 2018-01-31 (×2): qty 1

## 2018-01-31 MED ORDER — SODIUM CHLORIDE 0.9% FLUSH
5.0000 mL | Freq: Three times a day (TID) | INTRAVENOUS | Status: DC
  Administered 2018-01-31 – 2018-02-02 (×5): 5 mL

## 2018-01-31 MED ORDER — HEPARIN SODIUM (PORCINE) 5000 UNIT/ML IJ SOLN
5000.0000 [IU] | Freq: Three times a day (TID) | INTRAMUSCULAR | Status: DC
  Administered 2018-01-31 – 2018-02-02 (×5): 5000 [IU] via SUBCUTANEOUS
  Filled 2018-01-31 (×4): qty 1

## 2018-01-31 MED ORDER — IOPAMIDOL (ISOVUE-300) INJECTION 61%
50.0000 mL | Freq: Once | INTRAVENOUS | Status: AC | PRN
  Administered 2018-01-31: 10 mL

## 2018-01-31 MED ORDER — FENTANYL CITRATE (PF) 100 MCG/2ML IJ SOLN
INTRAMUSCULAR | Status: AC | PRN
  Administered 2018-01-31 (×2): 50 ug via INTRAVENOUS

## 2018-01-31 MED ORDER — MIDAZOLAM HCL 2 MG/2ML IJ SOLN
INTRAMUSCULAR | Status: AC | PRN
  Administered 2018-01-31 (×3): 1 mg via INTRAVENOUS

## 2018-01-31 MED ORDER — HYDROCODONE-ACETAMINOPHEN 5-325 MG PO TABS
1.0000 | ORAL_TABLET | ORAL | Status: DC | PRN
  Administered 2018-01-31: 15:00:00 2 via ORAL
  Administered 2018-02-01 (×2): 1 via ORAL
  Filled 2018-01-31: qty 1
  Filled 2018-01-31: qty 2
  Filled 2018-01-31: qty 1

## 2018-01-31 MED ORDER — MIDAZOLAM HCL 2 MG/2ML IJ SOLN
INTRAMUSCULAR | Status: AC
  Filled 2018-01-31: qty 6

## 2018-01-31 NOTE — Progress Notes (Signed)
Patient Demographics:    Jeremiah [name], is a 72 y.o. male, DOB - Aug 07, 1946, ZOX:[id number]  Admit date - 01/30/2018   Admitting Physician Briscoe Deutscher, MD  Outpatient Primary MD for the patient is Donia Guiles, MD  LOS - 1   Chief Complaint  Patient presents with  . Weakness  . Nausea  . Emesis        Subjective:    Jeremiah [name] today has no fevers, no emesis,  No chest pain,  Son at bedside, RUQ pain is better   Assessment  & Plan :    Principal Problem:   Acute cholecystitis Active Problems:   Diabetes mellitus without complication (HCC)   Hypertension   AKI (acute kidney injury) (HCC)   Normocytic anemia  Brief summary  72 year old with past medical history relevant for hypertension, diabetes and dyslipidemia who presents with almost 6-day history of abdominal pain.  Patient was admitted on 01/30/2018, workup consistent with acute cholecystitis, given duration of symptoms general surgery would prefer to have a percutaneous cholecystectomy drain placed on 01/31/2018 with subsequent elective lap chole in 6-8 weeks   Plan:- 1)Aki-most likely due to dehydration/decreased oral intake compounded by losartan use, admission creatinine up to 2.2, with hydration creatinine is down to 1.95, previously renal function was normal, hold losartan until patient is euvolemic and renal function improves further hydrate IV and oral  2)Acute cholecystitis-status post percutaneous cholecystectomy drain placement on 01/31/2018 by interventional radiology, surgical consult appreciated, continue IV Zosyn, patient will need elective lap chole as outpatient in 6-8 weeks.  Currently on clear liquid diet, white count is down to 17,000 from 22,000 on admission  3)DM-patient is only on clear liquid diet, risk of hypoglycemia, so hold Amaryl, hold Jardiance, hold Actos, may use sliding scale insulin coverage for now  (Allow some permissive Hyperglycemia rather than risk life-threatening hypoglycemia in a patient with unreliable oral intake. Use Novolog/Humalog Sliding scale insulin with Accu-Cheks/Fingersticks as ordered)  4)HTN-losartan on hold due to acute kidney injury and dehydration as stated above in #1, okay to restart amlodipine 10 mg daily,  may use IV Hydralazine 10 mg  Every 4 hours Prn for systolic blood pressure over 160 mmhg   Code Status : Full  Disposition Plan  : home  Consults  :  Gen Surg/IR/Pharnmacy  DVT Prophylaxis  :  Heparin   Lab Results  Component Value Date   PLT 229 01/31/2018    Inpatient Medications  Scheduled Meds: . amLODipine  10 mg Oral Daily  . heparin injection (subcutaneous)  5,000 Units Subcutaneous Q8H  . insulin aspart  0-9 Units Subcutaneous Q4H  . iopamidol      . ketorolac  1 drop Left Eye QHS  . lidocaine      . sodium chloride flush  5 mL Intracatheter Q8H  . timolol  1 drop Both Eyes BID   Continuous Infusions: . 0.9 % NaCl with KCl 40 mEq / L 100 mL/hr (01/31/18 1212)  . famotidine (PEPCID) IV Stopped (01/31/18 0945)  . piperacillin-tazobactam (ZOSYN)  IV 3.375 g (01/31/18 1418)   PRN Meds:.acetaminophen **OR** acetaminophen, fentaNYL (SUBLIMAZE) injection, hydrALAZINE, HYDROcodone-acetaminophen, ondansetron (ZOFRAN) IV, promethazine    Anti-infectives (From admission, onward)   Start  Dose/Rate Route Frequency Ordered Stop   01/31/18 0600  piperacillin-tazobactam (ZOSYN) IVPB 3.375 g     3.375 g 12.5 mL/hr over 240 Minutes Intravenous Every 8 hours 01/30/18 2100     01/30/18 2100  piperacillin-tazobactam (ZOSYN) IVPB 3.375 g     3.375 g 100 mL/hr over 30 Minutes Intravenous STAT 01/30/18 2059 01/30/18 2142        Objective:   Vitals:   01/31/18 1315 01/31/18 1321 01/31/18 1336 01/31/18 1427  BP: (!) 145/87 (!) 162/88 (!) 162/75 (!) 145/64  Pulse: 95 98  77  Resp: 16 17  16   Temp:   98 F (36.7 C) 98.3 F (36.8 C)    TempSrc:   Oral Oral  SpO2: 98% 98%  99%  Weight:      Height:        Wt Readings from Last 3 Encounters:  01/31/18 90.6 kg (199 lb 12.8 oz)  09/05/14 90.1 kg (198 lb 9.6 oz)     Intake/Output Summary (Last 24 hours) at 01/31/2018 1728 Last data filed at 01/31/2018 1400 Gross per 24 hour  Intake 1964 ml  Output 75 ml  Net 1889 ml     Physical Exam  Gen:- Awake Alert,  In no apparent distress  HEENT:- [place].AT, No sclera icterus Neck-Supple Neck,No JVD,.  Lungs-  CTAB , no wheezing CV- S1, S2 normal Abd-  +ve B.Sounds, Abd Soft, right upper quadrant tenderness, right-sided percutaneous cholecystectomy drain  extremity/Skin:- No  edema,    Psych-affect is appropriate Neuro-no new focal deficits   Data Review:   Micro Results No results found for this or any previous visit (from the past 240 hour(s)).  Radiology Reports Ct Abdomen Pelvis Wo Contrast  Result Date: 01/30/2018 CLINICAL DATA:  Nausea, vomiting, right lower quadrant pain and abdominal distention. EXAM: CT ABDOMEN AND PELVIS WITHOUT CONTRAST TECHNIQUE: Multidetector CT imaging of the abdomen and pelvis was performed following the standard protocol without IV contrast. COMPARISON:  01/30/2018 abdominal sonogram FINDINGS: Lower chest: Mild-to-moderate bibasilar atelectasis, right greater than left. Three-vessel coronary atherosclerosis. Hepatobiliary: Normal liver size. No liver mass. Distended gallbladder is filled with sludge and gallstones measuring up to 0.9 cm. Mild diffuse gallbladder wall thickening with haziness of the pericholecystic fat. No biliary ductal dilatation. Pancreas: Normal, with no mass or duct dilation. Spleen: Normal size. No mass. Adrenals/Urinary Tract: Normal adrenals. No contour deforming renal masses. No renal stones. No hydronephrosis. Normal bladder. Stomach/Bowel: Normal non-distended stomach. Nonspecific mild wall thickening throughout descending duodenum with associated peri duodenal fat  stranding. A few mildly dilated small bowel loops with associated fluid levels are noted in anterior abdomen measuring up to the 3.2 cm diameter. Distal small bowel is collapsed. No discrete small bowel caliber transition. No additional sites of small bowel wall thickening. Normal appendix. Mild scattered colonic diverticulosis. No large bowel wall thickening. Fluid levels throughout right and transverse colon. Vascular/Lymphatic: Atherosclerotic nonaneurysmal abdominal aorta. No pathologically enlarged lymph nodes in the abdomen or pelvis. Reproductive: Normal size prostate. Other: No pneumoperitoneum. Small volume perihepatic ascites. No focal fluid collection. Small fat containing umbilical hernia. Mild symmetric gynecomastia. Hypodense superficial subcutaneous 2.0 cm mass in the ventral medial right lower abdominal wall (series 2/image 84), nonspecific, potentially a sebaceous cyst. Musculoskeletal: No aggressive appearing focal osseous lesions. Moderate lumbar spondylosis. IMPRESSION: 1. Distended mildly thick-walled gallbladder filled with sludge and gallstones. Pericholecystic fat haziness. Acute cholecystitis not excluded. No biliary ductal dilatation. 2. Nonspecific wall thickening in the descending duodenum with periduodenal fat stranding.  Findings could be reactive, although intrinsic duodenitis/peptic ulcer disease not excluded. 3. Mildly dilated mid small bowel loops with air-fluid levels, with no focal small bowel caliber transition. Fluid levels in the proximal and mid colon. Findings are more suggestive of a mild adynamic ileus. 4. Small volume perihepatic ascites. 5. Mild-to-moderate bibasilar atelectasis. 6.  Aortic Atherosclerosis (ICD10-I70.0).  Coronary atherosclerosis. Electronically Signed   By: Delbert Phenix M.D.   On: 01/30/2018 18:59   US Abdomen Limited  Result Date: 01/30/2018 CLINICAL DATA:  Right upper quadrant pain for several days EXAM: ULTRASOUND ABDOMEN LIMITED RIGHT UPPER  QUADRANT COMPARISON:  None. FINDINGS: Gallbladder: Gallbladder is well distended with echogenic sludge. The wall is difficult to distinguish but appears thickened in some areas. No definitive calculi are seen. Common bile duct: Diameter: 5 mm. Liver: Diffusely heterogeneous without focal mass. These changes likely represent fatty infiltration. Portal vein is patent on color Doppler imaging with normal direction of blood flow towards the liver. Minimal ascites is noted. IMPRESSION: Well distended gallbladder with diffuse echogenic sludge within. No definitive calculi are seen. Wall thickening is noted with a positive sonographic Murphy sign consistent with acute cholecystitis. Minimal ascites. Heterogeneous changes in the liver likely related to fatty infiltration. No mass is seen. Electronically Signed   By: Alcide Clever M.D.   On: 01/30/2018 16:58   Ir Perc Cholecystostomy  Result Date: 01/31/2018 CLINICAL DATA:  Acute cholecystitis EXAM: PERCUTANEOUS CHOLECYSTOSTOMY TUBE PLACEMENT WITH ULTRASOUND AND FLUOROSCOPIC GUIDANCE FLUOROSCOPY TIME:  1.2 minutes; 728 uGym2 DAP TECHNIQUE: The procedure, risks (including but not limited to bleeding, infection, organ damage ), benefits, and alternatives were explained to the patient. Questions regarding the procedure were encouraged and answered. The patient understands and consents to the procedure. Survey ultrasound of the abdomen was performed and an appropriate skin entry site was identified. Skin site was marked, prepped with Betadine, and draped in usual sterile fashion, and infiltrated locally with 1% lidocaine. Intravenous Fentanyl and Versed were administered as conscious sedation during continuous monitoring of the patient's level of consciousness and physiological / cardiorespiratory status by the radiology RN, with a total moderate sedation time of 14 minutes. Under real-time ultrasound guidance, gallbladder was accessed using a transhepatic approach with a  21-gauge needle. Ultrasound image documentation was saved. Bile returned through the hub. Needle was exchanged over a 018 guidewire for transitional dilator which allowed placement of 035 J wire. Over this, a 10.2 French pigtail catheter was advanced and formed centrally in the gallbladder lumen. Small contrast injection confirmed appropriate position. Catheter secured externally with 0 Prolene suture and placed external drain bag. Patient tolerated the procedure well. COMPLICATIONS: COMPLICATIONS none IMPRESSION: 1. Technically successful percutaneous cholecystostomy tube placement with ultrasound and fluoroscopic guidance. Electronically Signed   By: Corlis Leak M.D.   On: 01/31/2018 13:42     CBC Recent Labs  Lab 01/30/18 1547 01/31/18 0549  WBC 22.6* 17.0*  HGB 12.6* 11.1*  HCT 36.5* 33.6*  PLT 241 229  MCV 87.1 87.5  MCH 30.1 28.9  MCHC 34.5 33.0  RDW 14.9 15.0  LYMPHSABS 1.1 1.3  MONOABS 3.3* 2.4*  EOSABS 0.0 0.1  BASOSABS 0.0 0.0    Chemistries  Recent Labs  Lab 01/30/18 1547 01/31/18 0549  NA 136 137  K 4.4 3.3*  CL 100* 106  CO2 24 23  GLUCOSE 302* 133*  BUN 38* 39*  CREATININE 2.23* 1.95*  CALCIUM 8.9 8.2*  AST 50* 26  ALT 53 38  ALKPHOS 112 95  BILITOT 1.3* 1.0   ------------------------------------------------------------------------------------------------------------------ No results for input(s): CHOL, HDL, LDLCALC, TRIG, CHOLHDL, LDLDIRECT in the last 72 hours.  No results found for: HGBA1C ------------------------------------------------------------------------------------------------------------------ No results for input(s): TSH, T4TOTAL, T3FREE, THYROIDAB in the last 72 hours.  Invalid input(s): FREET3 ------------------------------------------------------------------------------------------------------------------ No results for input(s): VITAMINB12, FOLATE, FERRITIN, TIBC, IRON, RETICCTPCT in the last 72 hours.  Coagulation profile Recent Labs    Lab 01/31/18 0842  INR 1.01    No results for input(s): DDIMER in the last 72 hours.  Cardiac Enzymes No results for input(s): CKMB, TROPONINI, MYOGLOBIN in the last 168 hours.  Invalid input(s): CK ------------------------------------------------------------------------------------------------------------------ No results found for: BNP   Shon Haleourage Naviyah Schaffert M.D on 01/31/2018 at 5:28 PM  Between 7am to 7pm - Pager - [phone]  After 7pm go to [email or web address] - password [email or web address]  [facility] -  Office  [phone]   Voice Recognition Reubin Milan/Dragon dictation system was used to create this note, attempts have been made to correct errors. Please contact the author with questions and/or clarifications.

## 2018-01-31 NOTE — Progress Notes (Signed)
Acute cholecystitis  Subjective: Pt feels some better, still with RUQ pain  Objective: Vital signs in last 24 hours: Temp:  [97.5 F (36.4 C)-98.6 F (37 C)] 98.3 F (36.8 C) (03/10 0428) Pulse Rate:  [83-92] 84 (03/10 0428) Resp:  [13-18] 13 (03/10 0428) BP: (117-157)/(61-76) 131/76 (03/10 0428) SpO2:  [93 %-98 %] 93 % (03/10 0428) Weight:  [199 lb 8.3 oz (90.5 kg)-202 lb (91.6 kg)] 199 lb 12.8 oz (90.6 kg) (03/10 0428) Last BM Date: 01/29/18  Intake/Output from previous day: 03/09 0701 - 03/10 0700 In: 1684 [I.V.:684; IV Piggyback:1000] Out: 75 [Urine:75] Intake/Output this shift: Total I/O In: 50 [IV Piggyback:50] Out: -   General appearance: alert and cooperative GI: TTP RUQ  Lab Results:  Results for orders placed or performed during the hospital encounter of 01/30/18 (from the past 24 hour(s))  Comprehensive metabolic panel     Status: Abnormal   Collection Time: 01/30/18  3:47 PM  Result Value Ref Range   Sodium 136 135 - 145 mmol/L   Potassium 4.4 3.5 - 5.1 mmol/L   Chloride 100 (L) 101 - 111 mmol/L   CO2 24 22 - 32 mmol/L   Glucose, Bld 302 (H) 65 - 99 mg/dL   BUN 38 (H) 6 - 20 mg/dL   Creatinine, Ser 1.61 (H) 0.61 - 1.24 mg/dL   Calcium 8.9 8.9 - 09.6 mg/dL   Total Protein 8.1 6.5 - 8.1 g/dL   Albumin 2.8 (L) 3.5 - 5.0 g/dL   AST 50 (H) 15 - 41 U/L   ALT 53 17 - 63 U/L   Alkaline Phosphatase 112 38 - 126 U/L   Total Bilirubin 1.3 (H) 0.3 - 1.2 mg/dL   GFR calc non Af Amer 28 (L) >60 mL/min   GFR calc Af Amer 32 (L) >60 mL/min   Anion gap 12 5 - 15  Lipase, blood     Status: None   Collection Time: 01/30/18  3:47 PM  Result Value Ref Range   Lipase 20 11 - 51 U/L  CBC with Differential     Status: Abnormal   Collection Time: 01/30/18  3:47 PM  Result Value Ref Range   WBC 22.6 (H) 4.0 - 10.5 K/uL   RBC 4.19 (L) 4.22 - 5.81 MIL/uL   Hemoglobin 12.6 (L) 13.0 - 17.0 g/dL   HCT 04.5 (L) 40.9 - 81.1 %   MCV 87.1 78.0 - 100.0 fL   MCH 30.1 26.0 -  34.0 pg   MCHC 34.5 30.0 - 36.0 g/dL   RDW 91.4 78.2 - 95.6 %   Platelets 241 150 - 400 K/uL   Neutrophils Relative % 80 %   Neutro Abs 18.2 (H) 1.7 - 7.7 K/uL   Lymphocytes Relative 5 %   Lymphs Abs 1.1 0.7 - 4.0 K/uL   Monocytes Relative 15 %   Monocytes Absolute 3.3 (H) 0.1 - 1.0 K/uL   Eosinophils Relative 0 %   Eosinophils Absolute 0.0 0.0 - 0.7 K/uL   Basophils Relative 0 %   Basophils Absolute 0.0 0.0 - 0.1 K/uL  Urinalysis, Routine w reflex microscopic     Status: Abnormal   Collection Time: 01/30/18  3:47 PM  Result Value Ref Range   Color, Urine YELLOW YELLOW   APPearance CLEAR CLEAR   Specific Gravity, Urine 1.023 1.005 - 1.030   pH 5.0 5.0 - 8.0   Glucose, UA >=500 (A) NEGATIVE mg/dL   Hgb urine dipstick SMALL (A) NEGATIVE   Bilirubin  Urine NEGATIVE NEGATIVE   Ketones, ur NEGATIVE NEGATIVE mg/dL   Protein, ur 147100 (A) NEGATIVE mg/dL   Nitrite NEGATIVE NEGATIVE   Leukocytes, UA NEGATIVE NEGATIVE   RBC / HPF 0-5 0 - 5 RBC/hpf   WBC, UA 0-5 0 - 5 WBC/hpf   Bacteria, UA NONE SEEN NONE SEEN   Squamous Epithelial / LPF 0-5 (A) NONE SEEN  I-stat troponin, ED     Status: None   Collection Time: 01/30/18  4:03 PM  Result Value Ref Range   Troponin i, poc 0.00 0.00 - 0.08 ng/mL   Comment 3          CBG monitoring, ED     Status: Abnormal   Collection Time: 01/30/18  8:59 PM  Result Value Ref Range   Glucose-Capillary 251 (H) 65 - 99 mg/dL  Glucose, capillary     Status: Abnormal   Collection Time: 01/31/18 12:04 AM  Result Value Ref Range   Glucose-Capillary 180 (H) 65 - 99 mg/dL  Glucose, capillary     Status: Abnormal   Collection Time: 01/31/18  4:24 AM  Result Value Ref Range   Glucose-Capillary 132 (H) 65 - 99 mg/dL  Comprehensive metabolic panel     Status: Abnormal   Collection Time: 01/31/18  5:49 AM  Result Value Ref Range   Sodium 137 135 - 145 mmol/L   Potassium 3.3 (L) 3.5 - 5.1 mmol/L   Chloride 106 101 - 111 mmol/L   CO2 23 22 - 32 mmol/L    Glucose, Bld 133 (H) 65 - 99 mg/dL   BUN 39 (H) 6 - 20 mg/dL   Creatinine, Ser 8.291.95 (H) 0.61 - 1.24 mg/dL   Calcium 8.2 (L) 8.9 - 10.3 mg/dL   Total Protein 6.9 6.5 - 8.1 g/dL   Albumin 2.4 (L) 3.5 - 5.0 g/dL   AST 26 15 - 41 U/L   ALT 38 17 - 63 U/L   Alkaline Phosphatase 95 38 - 126 U/L   Total Bilirubin 1.0 0.3 - 1.2 mg/dL   GFR calc non Af Amer 32 (L) >60 mL/min   GFR calc Af Amer 38 (L) >60 mL/min   Anion gap 8 5 - 15  CBC WITH DIFFERENTIAL     Status: Abnormal   Collection Time: 01/31/18  5:49 AM  Result Value Ref Range   WBC 17.0 (H) 4.0 - 10.5 K/uL   RBC 3.84 (L) 4.22 - 5.81 MIL/uL   Hemoglobin 11.1 (L) 13.0 - 17.0 g/dL   HCT 56.233.6 (L) 13.039.0 - 86.552.0 %   MCV 87.5 78.0 - 100.0 fL   MCH 28.9 26.0 - 34.0 pg   MCHC 33.0 30.0 - 36.0 g/dL   RDW 78.415.0 69.611.5 - 29.515.5 %   Platelets 229 150 - 400 K/uL   Neutrophils Relative % 78 %   Neutro Abs 13.3 (H) 1.7 - 7.7 K/uL   Lymphocytes Relative 7 %   Lymphs Abs 1.3 0.7 - 4.0 K/uL   Monocytes Relative 14 %   Monocytes Absolute 2.4 (H) 0.1 - 1.0 K/uL   Eosinophils Relative 1 %   Eosinophils Absolute 0.1 0.0 - 0.7 K/uL   Basophils Relative 0 %   Basophils Absolute 0.0 0.0 - 0.1 K/uL  Glucose, capillary     Status: Abnormal   Collection Time: 01/31/18  8:33 AM  Result Value Ref Range   Glucose-Capillary 135 (H) 65 - 99 mg/dL     Studies/Results Radiology     MEDS, Scheduled .  insulin aspart  0-9 Units Subcutaneous Q4H  . ketorolac  1 drop Left Eye QHS  . timolol  1 drop Both Eyes BID     Assessment: Acute cholecystitis   Plan: IR to place perc drain today.  Cont IV abx.  Ok for clears after procedure.  Recheck labs in AM.    LOS: 1 day    Vanita Panda, MD [facility], [place] [phone]   01/31/2018 9:22 AM

## 2018-01-31 NOTE — Progress Notes (Signed)
Chief Complaint: Patient was seen in consultation today for cholecystitis at the request of Dr. Chevis Pretty  Referring Physician(s): Dr. Chevis Pretty  Supervising Physician: Oley Balm  Patient Status: [name]  History of Present Illness: Jeremiah [name] is a 72 y.o. male admitted with about 5-6 days of abd pain. His workup finds acute cholecystitis. Surgical team has evaluated Jeremiah [name] and feels Jeremiah [name] is higher risk for cholecystectomy at this time and has requested IR to place perc chole drain. Chart, meds, labs, imaging reviewed. Has been NPO since admission Son at bedside.  Past Medical History:  Diagnosis Date  . Arthritis   . Diabetes mellitus without complication (HCC)   . Hypertension   . Peripheral vascular disease (HCC)     History reviewed. No pertinent surgical history.  Allergies: Patient has no known allergies.  Medications:  Current Facility-Administered Medications:  .  acetaminophen (TYLENOL) tablet 650 mg, 650 mg, Oral, Q6H PRN **OR** acetaminophen (TYLENOL) suppository 650 mg, 650 mg, Rectal, Q6H PRN, Opyd, Timothy S, MD .  famotidine (PEPCID) IVPB 20 mg premix, 20 mg, Intravenous, Q12H, Opyd, Lavone Neri, MD, Stopped at 01/31/18 0945 .  fentaNYL (SUBLIMAZE) injection 25-50 mcg, 25-50 mcg, Intravenous, Q2H PRN, Opyd, Timothy S, MD .  hydrALAZINE (APRESOLINE) injection 10 mg, 10 mg, Intravenous, Q4H PRN, Opyd, Timothy S, MD .  insulin aspart (novoLOG) injection 0-9 Units, 0-9 Units, Subcutaneous, Q4H, Opyd, Lavone Neri, MD, 1 Units at 01/31/18 0914 .  ketorolac (ACULAR) 0.5 % ophthalmic solution 1 drop, 1 drop, Left Eye, QHS, Opyd, Lavone Neri, MD, 1 drop at 01/30/18 2313 .  ondansetron (ZOFRAN) injection 4 mg, 4 mg, Intravenous, Q6H PRN, Opyd, Timothy S, MD .  piperacillin-tazobactam (ZOSYN) IVPB 3.375 g, 3.375 g, Intravenous, Q8H, Poindexter, Leann T, RPH, Stopped at 01/31/18 0919 .  promethazine (PHENERGAN) tablet 12.5 mg, 12.5 mg, Oral, Q6H PRN, Opyd,  Timothy S, MD .  timolol (TIMOPTIC) 0.5 % ophthalmic solution 1 drop, 1 drop, Both Eyes, BID, Opyd, Lavone Neri, MD, 1 drop at 01/31/18 1610    Family History  Problem Relation Age of Onset  . Diabetes Mother   . Hypertension Mother   . Heart disease Father   . Heart attack Father   . Diabetes Sister   . Hypertension Sister   . Diabetes Brother   . Heart disease Brother   . Hypertension Brother   . Heart attack Brother     Social History   Socioeconomic History  . Marital status: Divorced    Spouse name: None  . Number of children: None  . Years of education: None  . Highest education level: None  Social Needs  . Financial resource strain: None  . Food insecurity - worry: None  . Food insecurity - inability: None  . Transportation needs - medical: None  . Transportation needs - non-medical: None  Occupational History  . None  Tobacco Use  . Smoking status: Former Smoker    Last attempt to quit: 11/25/1983    Years since quitting: 34.2  Substance and Sexual Activity  . Alcohol use: No  . Drug use: No  . Sexual activity: None  Other Topics Concern  . None  Social History Narrative  . None    Review of Systems: A 12 point ROS discussed and pertinent positives are indicated in the HPI above.  All other systems are negative.  Review of Systems  Vital Signs: BP 131/76 (BP Location: Right Arm)   Pulse 84  Temp 98.3 F (36.8 C) (Oral)   Resp 13   Ht 5\' 9"  (1.753 m)   Wt 199 lb 12.8 oz (90.6 kg)   SpO2 93%   BMI 29.51 kg/m   Physical Exam  Constitutional: He is oriented to person, place, and time. He appears well-developed. No distress.  HENT:  Head: Normocephalic.  Mouth/Throat: Oropharynx is clear and moist.  Neck: Normal range of motion.  Cardiovascular: Normal rate, regular rhythm and normal heart sounds.  Pulmonary/Chest: Effort normal and breath sounds normal. No respiratory distress.  Abdominal: Soft. He exhibits no distension. There is tenderness.  There is no rebound and no guarding.  Mildly tender RUQ, no rebound  Neurological: He is alert and oriented to person, place, and time.  Skin: Skin is warm and dry.  Psychiatric: He has a normal mood and affect.     Imaging: Ct Abdomen Pelvis Wo Contrast  Result Date: 01/30/2018 CLINICAL DATA:  Nausea, vomiting, right lower quadrant pain and abdominal distention. EXAM: CT ABDOMEN AND PELVIS WITHOUT CONTRAST TECHNIQUE: Multidetector CT imaging of the abdomen and pelvis was performed following the standard protocol without IV contrast. COMPARISON:  01/30/2018 abdominal sonogram FINDINGS: Lower chest: Mild-to-moderate bibasilar atelectasis, right greater than left. Three-vessel coronary atherosclerosis. Hepatobiliary: Normal liver size. No liver mass. Distended gallbladder is filled with sludge and gallstones measuring up to 0.9 cm. Mild diffuse gallbladder wall thickening with haziness of the pericholecystic fat. No biliary ductal dilatation. Pancreas: Normal, with no mass or duct dilation. Spleen: Normal size. No mass. Adrenals/Urinary Tract: Normal adrenals. No contour deforming renal masses. No renal stones. No hydronephrosis. Normal bladder. Stomach/Bowel: Normal non-distended stomach. Nonspecific mild wall thickening throughout descending duodenum with associated peri duodenal fat stranding. A few mildly dilated small bowel loops with associated fluid levels are noted in anterior abdomen measuring up to the 3.2 cm diameter. Distal small bowel is collapsed. No discrete small bowel caliber transition. No additional sites of small bowel wall thickening. Normal appendix. Mild scattered colonic diverticulosis. No large bowel wall thickening. Fluid levels throughout right and transverse colon. Vascular/Lymphatic: Atherosclerotic nonaneurysmal abdominal aorta. No pathologically enlarged lymph nodes in the abdomen or pelvis. Reproductive: Normal size prostate. Other: No pneumoperitoneum. Small volume  perihepatic ascites. No focal fluid collection. Small fat containing umbilical hernia. Mild symmetric gynecomastia. Hypodense superficial subcutaneous 2.0 cm mass in the ventral medial right lower abdominal wall (series 2/image 84), nonspecific, potentially a sebaceous cyst. Musculoskeletal: No aggressive appearing focal osseous lesions. Moderate lumbar spondylosis. IMPRESSION: 1. Distended mildly thick-walled gallbladder filled with sludge and gallstones. Pericholecystic fat haziness. Acute cholecystitis not excluded. No biliary ductal dilatation. 2. Nonspecific wall thickening in the descending duodenum with periduodenal fat stranding. Findings could be reactive, although intrinsic duodenitis/peptic ulcer disease not excluded. 3. Mildly dilated mid small bowel loops with air-fluid levels, with no focal small bowel caliber transition. Fluid levels in the proximal and mid colon. Findings are more suggestive of a mild adynamic ileus. 4. Small volume perihepatic ascites. 5. Mild-to-moderate bibasilar atelectasis. 6.  Aortic Atherosclerosis (ICD10-I70.0).  Coronary atherosclerosis. Electronically Signed   By: Delbert PhenixJason A Poff M.D.   On: 01/30/2018 18:59   [place] Abdomen Limited  Result Date: 01/30/2018 CLINICAL DATA:  Right upper quadrant pain for several days EXAM: ULTRASOUND ABDOMEN LIMITED RIGHT UPPER QUADRANT COMPARISON:  None. FINDINGS: Gallbladder: Gallbladder is well distended with echogenic sludge. The wall is difficult to distinguish but appears thickened in some areas. No definitive calculi are seen. Common bile duct: Diameter: 5 mm. Liver: Diffusely heterogeneous  without focal mass. These changes likely represent fatty infiltration. Portal vein is patent on color Doppler imaging with normal direction of blood flow towards the liver. Minimal ascites is noted. IMPRESSION: Well distended gallbladder with diffuse echogenic sludge within. No definitive calculi are seen. Wall thickening is noted with a positive  sonographic Murphy sign consistent with acute cholecystitis. Minimal ascites. Heterogeneous changes in the liver likely related to fatty infiltration. No mass is seen. Electronically Signed   By: Alcide Clever M.D.   On: 01/30/2018 16:58    Labs:  CBC: Recent Labs    01/30/18 1547 01/31/18 0549  WBC 22.6* 17.0*  HGB 12.6* 11.1*  HCT 36.5* 33.6*  PLT 241 229    COAGS: Recent Labs    01/31/18 0842  INR 1.01    BMP: Recent Labs    01/30/18 1547 01/31/18 0549  NA 136 137  K 4.4 3.3*  CL 100* 106  CO2 24 23  GLUCOSE 302* 133*  BUN 38* 39*  CALCIUM 8.9 8.2*  CREATININE 2.23* 1.95*  GFRNONAA 28* 32*  GFRAA 32* 38*    LIVER FUNCTION TESTS: Recent Labs    01/30/18 1547 01/31/18 0549  BILITOT 1.3* 1.0  AST 50* 26  ALT 53 38  ALKPHOS 112 95  PROT 8.1 6.9  ALBUMIN 2.8* 2.4*    TUMOR MARKERS: No results for input(s): AFPTM, CEA, CA199, CHROMGRNA in the last 8760 hours.  Assessment and Plan: Acute cholecystitis For IR perc chole tube placement Labs reviewed, ok Risks and benefits discussed with the patient including, but not limited to bleeding, infection, gallbladder perforation, bile leak, sepsis or even death.  All of the patient's questions were answered, patient is agreeable to proceed. Consent signed and in chart.    Thank you for this interesting consult.  I greatly enjoyed meeting MERRICK MAGGIO and look forward to participating in their care.  A copy of this report was sent to the requesting provider on this date.  Electronically Signed: Brayton El, PA-C 01/31/2018, 11:19 AM   I spent a total of 20 minutes in face to face in clinical consultation, greater than 50% of which was counseling/coordinating care for perc chole drain

## 2018-01-31 NOTE — Progress Notes (Signed)
Patient arrived on the unit at approximately 2155. He was accompanied by his spouse.NPO maintained as ordered. No complaints voiced at this time.

## 2018-01-31 NOTE — Plan of Care (Signed)
Reviewed importance of notifying RN with any questions or concerns related to pain control, safety, medications, or drain care. Pt and spouse both attentive and verbalized understanding of all education.

## 2018-01-31 NOTE — Procedures (Signed)
  Procedure: Perc cholecystostomy drain   EBL:   minimal Complications:  none immediate  See full dictation in [facility] PACS.  Thora Lance. Keison Glendinning MD Main # [phone] Pager  [phone]

## 2018-01-31 NOTE — Progress Notes (Signed)
MEDICATION-RELATED CONSULT NOTE   IR Procedure Consult - Anticoagulant/Antiplatelet PTA/Inpatient Med List Review by Pharmacist    Procedure:  Perc cholecystostomy drain    Completed: 3/10 @ 1330  Post-Procedural bleeding risk per IR MD assessment:  standard  Antithrombotic medications on inpatient or PTA profile prior to procedure:     heparin 5000 units SQ q8h  Recommended restart time per IR Post-Procedure Guidelines:    Day 0, at least 6 hours or at next standard dose interval       Plan:      resume heparin 5000 units SQ q8h at 2200  [facility] Kannen Moxey RPh 01/31/2018, 1:46 PM Pager [phone]

## 2018-02-01 LAB — COMPREHENSIVE METABOLIC PANEL
ALT: 33 U/L (ref 17–63)
AST: 29 U/L (ref 15–41)
Albumin: 2.3 g/dL — ABNORMAL LOW (ref 3.5–5.0)
Alkaline Phosphatase: 91 U/L (ref 38–126)
Anion gap: 8 (ref 5–15)
BUN: 33 mg/dL — ABNORMAL HIGH (ref 6–20)
CO2: 21 mmol/L — ABNORMAL LOW (ref 22–32)
Calcium: 8.2 mg/dL — ABNORMAL LOW (ref 8.9–10.3)
Chloride: 108 mmol/L (ref 101–111)
Creatinine, Ser: 1.74 mg/dL — ABNORMAL HIGH (ref 0.61–1.24)
GFR calc Af Amer: 43 mL/min — ABNORMAL LOW (ref >60)
GFR calc non Af Amer: 37 mL/min — ABNORMAL LOW (ref >60)
Glucose, Bld: 123 mg/dL — ABNORMAL HIGH (ref 65–99)
Potassium: 3.7 mmol/L (ref 3.5–5.1)
Sodium: 137 mmol/L (ref 135–145)
Total Bilirubin: 0.6 mg/dL (ref 0.3–1.2)
Total Protein: 6.9 g/dL (ref 6.5–8.1)

## 2018-02-01 LAB — GLUCOSE, CAPILLARY
Glucose-Capillary: 119 mg/dL — ABNORMAL HIGH (ref 65–99)
Glucose-Capillary: 119 mg/dL — ABNORMAL HIGH (ref 65–99)
Glucose-Capillary: 127 mg/dL — ABNORMAL HIGH (ref 65–99)
Glucose-Capillary: 167 mg/dL — ABNORMAL HIGH (ref 65–99)
Glucose-Capillary: 172 mg/dL — ABNORMAL HIGH (ref 65–99)
Glucose-Capillary: 228 mg/dL — ABNORMAL HIGH (ref 65–99)

## 2018-02-01 LAB — CBC
HCT: 34.9 % — ABNORMAL LOW (ref 39.0–52.0)
Hemoglobin: 11.5 g/dL — ABNORMAL LOW (ref 13.0–17.0)
MCH: 28.8 pg (ref 26.0–34.0)
MCHC: 33 g/dL (ref 30.0–36.0)
MCV: 87.5 fL (ref 78.0–100.0)
Platelets: 218 10*3/uL (ref 150–400)
RBC: 3.99 MIL/uL — ABNORMAL LOW (ref 4.22–5.81)
RDW: 15.2 % (ref 11.5–15.5)
WBC: 12.6 10*3/uL — ABNORMAL HIGH (ref 4.0–10.5)

## 2018-02-01 LAB — HEMOGLOBIN A1C
Hgb A1c MFr Bld: 8.8 % — ABNORMAL HIGH (ref 4.8–5.6)
Mean Plasma Glucose: 205.86 mg/dL

## 2018-02-01 MED ORDER — FAMOTIDINE 20 MG PO TABS
20.0000 mg | ORAL_TABLET | Freq: Every day | ORAL | Status: DC
  Administered 2018-02-01: 20 mg via ORAL
  Filled 2018-02-01: qty 1

## 2018-02-01 NOTE — Progress Notes (Signed)
Referring Physician(s): Toth,P  Supervising Physician: Ruel Favors  Patient Status:  [name] - In-pt  Chief Complaint:  cholecystitis  Subjective: Pt feeling better today; denies N/V; tol liquid diet ok; has some mild RUQ soreness   Allergies: Patient has no known allergies.  Medications: Prior to Admission medications   Medication Sig Start Date End Date Taking? Authorizing Provider  amLODipine (NORVASC) 10 MG tablet Take 10 mg by mouth daily.   Yes [provider]  aspirin 81 MG tablet Take 81 mg by mouth daily.   Yes [provider]  brimonidine (ALPHAGAN) 0.2 % ophthalmic solution Place 1 drop into both eyes 2 (two) times daily.    Yes [provider]  glimepiride (AMARYL) 4 MG tablet Take 4 mg by mouth 2 (two) times daily.    Yes [provider]  JARDIANCE 10 MG TABS tablet Take 10 mg by mouth every morning. 01/04/18  Yes [provider]  losartan (COZAAR) 100 MG tablet Take 100 mg by mouth daily. 01/27/18  Yes [provider]  Multiple Vitamin (MULTIVITAMIN) tablet Take 1 tablet by mouth daily.   Yes [provider]  pioglitazone (ACTOS) 30 MG tablet Take 30 mg by mouth daily. 01/07/18  Yes [provider]  simvastatin (ZOCOR) 40 MG tablet Take 40 mg by mouth daily.   Yes [provider]  timolol (TIMOPTIC) 0.5 % ophthalmic solution Place 1 drop into both eyes 2 (two) times daily. 01/27/18  Yes [provider]     Vital Signs: BP (!) 133/58   Pulse 81   Temp 98 F (36.7 C) (Oral)   Resp 15   Ht 5\' 9"  (1.753 m)   Wt 199 lb 12.8 oz (90.6 kg)   SpO2 98%   BMI 29.51 kg/m   Physical Exam GB drain intact, output about 10-15 cc dark bile; dressing dry, site mildly tender  Imaging: Ct Abdomen Pelvis Wo Contrast  Result Date: 01/30/2018 CLINICAL DATA:  Nausea, vomiting, right lower quadrant pain and abdominal distention. EXAM: CT ABDOMEN AND PELVIS WITHOUT CONTRAST TECHNIQUE:  Multidetector CT imaging of the abdomen and pelvis was performed following the standard protocol without IV contrast. COMPARISON:  01/30/2018 abdominal sonogram FINDINGS: Lower chest: Mild-to-moderate bibasilar atelectasis, right greater than left. Three-vessel coronary atherosclerosis. Hepatobiliary: Normal liver size. No liver mass. Distended gallbladder is filled with sludge and gallstones measuring up to 0.9 cm. Mild diffuse gallbladder wall thickening with haziness of the pericholecystic fat. No biliary ductal dilatation. Pancreas: Normal, with no mass or duct dilation. Spleen: Normal size. No mass. Adrenals/Urinary Tract: Normal adrenals. No contour deforming renal masses. No renal stones. No hydronephrosis. Normal bladder. Stomach/Bowel: Normal non-distended stomach. Nonspecific mild wall thickening throughout descending duodenum with associated peri duodenal fat stranding. A few mildly dilated small bowel loops with associated fluid levels are noted in anterior abdomen measuring up to the 3.2 cm diameter. Distal small bowel is collapsed. No discrete small bowel caliber transition. No additional sites of small bowel wall thickening. Normal appendix. Mild scattered colonic diverticulosis. No large bowel wall thickening. Fluid levels throughout right and transverse colon. Vascular/Lymphatic: Atherosclerotic nonaneurysmal abdominal aorta. No pathologically enlarged lymph nodes in the abdomen or pelvis. Reproductive: Normal size prostate. Other: No pneumoperitoneum. Small volume perihepatic ascites. No focal fluid collection. Small fat containing umbilical hernia. Mild symmetric gynecomastia. Hypodense superficial subcutaneous 2.0 cm mass in the ventral medial right lower abdominal wall (series 2/image 84), nonspecific, potentially a sebaceous cyst. Musculoskeletal: No aggressive appearing focal osseous  lesions. Moderate lumbar spondylosis. IMPRESSION: 1. Distended mildly thick-walled gallbladder filled with  sludge and gallstones. Pericholecystic fat haziness. Acute cholecystitis not excluded. No biliary ductal dilatation. 2. Nonspecific wall thickening in the descending duodenum with periduodenal fat stranding. Findings could be reactive, although intrinsic duodenitis/peptic ulcer disease not excluded. 3. Mildly dilated mid small bowel loops with air-fluid levels, with no focal small bowel caliber transition. Fluid levels in the proximal and mid colon. Findings are more suggestive of a mild adynamic ileus. 4. Small volume perihepatic ascites. 5. Mild-to-moderate bibasilar atelectasis. 6.  Aortic Atherosclerosis (ICD10-I70.0).  Coronary atherosclerosis. Electronically Signed   By: Delbert PhenixJason A Poff M.D.   On: 01/30/2018 18:59   [place] Abdomen Limited  Result Date: 01/30/2018 CLINICAL DATA:  Right upper quadrant pain for several days EXAM: ULTRASOUND ABDOMEN LIMITED RIGHT UPPER QUADRANT COMPARISON:  None. FINDINGS: Gallbladder: Gallbladder is well distended with echogenic sludge. The wall is difficult to distinguish but appears thickened in some areas. No definitive calculi are seen. Common bile duct: Diameter: 5 mm. Liver: Diffusely heterogeneous without focal mass. These changes likely represent fatty infiltration. Portal vein is patent on color Doppler imaging with normal direction of blood flow towards the liver. Minimal ascites is noted. IMPRESSION: Well distended gallbladder with diffuse echogenic sludge within. No definitive calculi are seen. Wall thickening is noted with a positive sonographic Murphy sign consistent with acute cholecystitis. Minimal ascites. Heterogeneous changes in the liver likely related to fatty infiltration. No mass is seen. Electronically Signed   By: Alcide CleverMark  Lukens M.D.   On: 01/30/2018 16:58   Ir Perc Cholecystostomy  Result Date: 01/31/2018 CLINICAL DATA:  Acute cholecystitis EXAM: PERCUTANEOUS CHOLECYSTOSTOMY TUBE PLACEMENT WITH ULTRASOUND AND FLUOROSCOPIC GUIDANCE FLUOROSCOPY TIME:  1.2  minutes; 728 uGym2 DAP TECHNIQUE: The procedure, risks (including but not limited to bleeding, infection, organ damage ), benefits, and alternatives were explained to the patient. Questions regarding the procedure were encouraged and answered. The patient understands and consents to the procedure. Survey ultrasound of the abdomen was performed and an appropriate skin entry site was identified. Skin site was marked, prepped with Betadine, and draped in usual sterile fashion, and infiltrated locally with 1% lidocaine. Intravenous Fentanyl and Versed were administered as conscious sedation during continuous monitoring of the patient's level of consciousness and physiological / cardiorespiratory status by the radiology RN, with a total moderate sedation time of 14 minutes. Under real-time ultrasound guidance, gallbladder was accessed using a transhepatic approach with a 21-gauge needle. Ultrasound image documentation was saved. Bile returned through the hub. Needle was exchanged over a 018 guidewire for transitional dilator which allowed placement of 035 J wire. Over this, a 10.2 French pigtail catheter was advanced and formed centrally in the gallbladder lumen. Small contrast injection confirmed appropriate position. Catheter secured externally with 0 Prolene suture and placed external drain bag. Patient tolerated the procedure well. COMPLICATIONS: COMPLICATIONS none IMPRESSION: 1. Technically successful percutaneous cholecystostomy tube placement with ultrasound and fluoroscopic guidance. Electronically Signed   By: Corlis Leak  Hassell M.D.   On: 01/31/2018 13:42    Labs:  CBC: Recent Labs    01/30/18 1547 01/31/18 0549 02/01/18 0529  WBC 22.6* 17.0* 12.6*  HGB 12.6* 11.1* 11.5*  HCT 36.5* 33.6* 34.9*  PLT 241 229 218    COAGS: Recent Labs    01/31/18 0842  INR 1.01    BMP: Recent Labs    01/30/18 1547 01/31/18 0549 02/01/18 0529  NA 136 137 137  K 4.4 3.3* 3.7  CL 100* 106 108  CO2 24 23 21*    GLUCOSE 302* 133* 123*  BUN 38* 39* 33*  CALCIUM 8.9 8.2* 8.2*  CREATININE 2.23* 1.95* 1.74*  GFRNONAA 28* 32* 37*  GFRAA 32* 38* 43*    LIVER FUNCTION TESTS: Recent Labs    01/30/18 1547 01/31/18 0549 02/01/18 0529  BILITOT 1.3* 1.0 0.6  AST 50* 26 29  ALT 53 38 33  ALKPHOS 112 95 91  PROT 8.1 6.9 6.9  ALBUMIN 2.8* 2.4* 2.3*    Assessment and Plan: Acute cholecystitis , s/p perc cholecystostomy 3/10; afebrile; WBC 12.6(17), t bili nl, creat 1.74(1.95), hgb stable; cx pend; cont with drain NS flushes; drain will need to remain in place at least 4-6 weeks unless cholecystectomy done in interim; other plans as per CCS/IM   Electronically Signed: D. Jeananne Rama, PA-C 02/01/2018, 11:11 AM   I spent a total of 15 minutes at the the patient's bedside AND on the patient's hospital floor or unit, greater than 50% of which was counseling/coordinating care for gallbladder drain    Patient ID: Jeremiah [name], male   DOB: 08/11/1946, 72 y.o.   MRN: [id number]

## 2018-02-01 NOTE — Progress Notes (Signed)
[facility] PROGRESS NOTE  Jeremiah [name] ZOX:[id number] DOB: Aug 04, 1946 DOA: 01/30/2018 PCP: Donia Guiles, MD  Brief summary   72 year old with past medical history relevant for hypertension, diabetes and dyslipidemia who presents with almost 6-day history of abdominal pain.  Patient was admitted on 01/30/2018, workup consistent with acute cholecystitis, given duration of symptoms general surgery would prefer to have a percutaneous cholecystectomy drain placed on 01/31/2018 with subsequent elective lap chole in 6-8 weeks    Assessment/Plan:   AKI-most likely due to dehydration/decreased oral intake compounded by losartan use, admission creatinine up to 2.2, with hydration creatinine is down to 1.95, previously renal function was normal, hold losartan until patient is euvolemic and renal function improves further hydrate IV and oral  Acute cholecystitis-status post percutaneous cholecystectomy drain placement on 01/31/2018 by interventional radiology, surgical consult appreciated, continue IV Zosyn, patient will need elective lap chole as outpatient in 6-8 weeks.  Currently on clear liquid diet, will advance if tol;arteed. white count is down from 22,000 on admission. Will transition to oral antibiotic regimen in 24-48 hrs, pend blood cultures   DM-patient is only on clear liquid diet, risk of hypoglycemia, so hold Amaryl, hold Jardiance, hold Actos, may use sliding scale insulin coverage for now (Allow some permissive Hyperglycemia rather than risk life-threatening hypoglycemia in a patient with unreliable oral intake. Use Novolog/Humalog Sliding scale insulin with Accu-Cheks/Fingersticks as ordered)  HTN-losartan on hold due to acute kidney injury and dehydration as stated above in #1, okay to restart amlodipine 10 mg daily,  may use IV Hydralazine 10 mg  Every 4 hours Prn for systolic blood pressure over 160 mmhg      Code Status: full Family Communication: d/w patient   (indicate person spoken with, relationship, and if by phone, the number) Disposition Plan: home 24-48 hrs    Consultants:  Surgery  IR  Procedures:  IR  Antibiotics: Anti-infectives (From admission, onward)   Start     Dose/Rate Route Frequency Ordered Stop   01/31/18 0600  piperacillin-tazobactam (ZOSYN) IVPB 3.375 g     3.375 g 12.5 mL/hr over 240 Minutes Intravenous Every 8 hours 01/30/18 2100     01/30/18 2100  piperacillin-tazobactam (ZOSYN) IVPB 3.375 g     3.375 g 100 mL/hr over 30 Minutes Intravenous STAT 01/30/18 2059 01/30/18 2142        (indicate start date, and stop date if known)  HPI/Subjective: Alert. No distress. Had bowel movement. No acute pains   Objective: Vitals:   02/01/18 0528 02/01/18 1020  BP: (!) 162/73 (!) 133/58  Pulse: 84 81  Resp: 15   Temp: 98 F (36.7 C)   SpO2: 98%     Intake/Output Summary (Last 24 hours) at 02/01/2018 1156 Last data filed at 02/01/2018 0936 Gross per 24 hour  Intake 2970 ml  Output 480 ml  Net 2490 ml   Filed Weights   01/30/18 1506 01/30/18 2200 01/31/18 0428  Weight: 91.6 kg (202 lb) 90.5 kg (199 lb 8.3 oz) 90.6 kg (199 lb 12.8 oz)    Exam:   General:  No distress   Cardiovascular: s1,s2 rrr  Respiratory: CTA BL  Abdomen: soft, mild tender   Musculoskeletal: no leg edema    Data Reviewed: Basic Metabolic Panel: Recent Labs  Lab 01/30/18 1547 01/31/18 0549 02/01/18 0529  NA 136 137 137  K 4.4 3.3* 3.7  CL 100* 106 108  CO2 24 23 21*  GLUCOSE 302* 133* 123*  BUN 38* 39* 33*  CREATININE 2.23* 1.95* 1.74*  CALCIUM 8.9 8.2* 8.2*   Liver Function Tests: Recent Labs  Lab 01/30/18 1547 01/31/18 0549 02/01/18 0529  AST 50* 26 29  ALT 53 38 33  ALKPHOS 112 95 91  BILITOT 1.3* 1.0 0.6  PROT 8.1 6.9 6.9  ALBUMIN 2.8* 2.4* 2.3*   Recent Labs  Lab 01/30/18 1547  LIPASE 20   No results for input(s): AMMONIA in the last 168 hours. CBC: Recent Labs  Lab 01/30/18 1547  01/31/18 0549 02/01/18 0529  WBC 22.6* 17.0* 12.6*  NEUTROABS 18.2* 13.3*  --   HGB 12.6* 11.1* 11.5*  HCT 36.5* 33.6* 34.9*  MCV 87.1 87.5 87.5  PLT 241 229 218   Cardiac Enzymes: No results for input(s): CKTOTAL, CKMB, CKMBINDEX, TROPONINI in the last 168 hours. BNP (last 3 results) No results for input(s): BNP in the last 8760 hours.  ProBNP (last 3 results) No results for input(s): PROBNP in the last 8760 hours.  CBG: Recent Labs  Lab 01/31/18 2038 02/01/18 0057 02/01/18 0528 02/01/18 0729 02/01/18 1151  GLUCAP 183* 172* 119* 127* 228*    No results found for this or any previous visit (from the past 240 hour(s)).   Studies: Ct Abdomen Pelvis Wo Contrast  Result Date: 01/30/2018 CLINICAL DATA:  Nausea, vomiting, right lower quadrant pain and abdominal distention. EXAM: CT ABDOMEN AND PELVIS WITHOUT CONTRAST TECHNIQUE: Multidetector CT imaging of the abdomen and pelvis was performed following the standard protocol without IV contrast. COMPARISON:  01/30/2018 abdominal sonogram FINDINGS: Lower chest: Mild-to-moderate bibasilar atelectasis, right greater than left. Three-vessel coronary atherosclerosis. Hepatobiliary: Normal liver size. No liver mass. Distended gallbladder is filled with sludge and gallstones measuring up to 0.9 cm. Mild diffuse gallbladder wall thickening with haziness of the pericholecystic fat. No biliary ductal dilatation. Pancreas: Normal, with no mass or duct dilation. Spleen: Normal size. No mass. Adrenals/Urinary Tract: Normal adrenals. No contour deforming renal masses. No renal stones. No hydronephrosis. Normal bladder. Stomach/Bowel: Normal non-distended stomach. Nonspecific mild wall thickening throughout descending duodenum with associated peri duodenal fat stranding. A few mildly dilated small bowel loops with associated fluid levels are noted in anterior abdomen measuring up to the 3.2 cm diameter. Distal small bowel is collapsed. No discrete small  bowel caliber transition. No additional sites of small bowel wall thickening. Normal appendix. Mild scattered colonic diverticulosis. No large bowel wall thickening. Fluid levels throughout right and transverse colon. Vascular/Lymphatic: Atherosclerotic nonaneurysmal abdominal aorta. No pathologically enlarged lymph nodes in the abdomen or pelvis. Reproductive: Normal size prostate. Other: No pneumoperitoneum. Small volume perihepatic ascites. No focal fluid collection. Small fat containing umbilical hernia. Mild symmetric gynecomastia. Hypodense superficial subcutaneous 2.0 cm mass in the ventral medial right lower abdominal wall (series 2/image 84), nonspecific, potentially a sebaceous cyst. Musculoskeletal: No aggressive appearing focal osseous lesions. Moderate lumbar spondylosis. IMPRESSION: 1. Distended mildly thick-walled gallbladder filled with sludge and gallstones. Pericholecystic fat haziness. Acute cholecystitis not excluded. No biliary ductal dilatation. 2. Nonspecific wall thickening in the descending duodenum with periduodenal fat stranding. Findings could be reactive, although intrinsic duodenitis/peptic ulcer disease not excluded. 3. Mildly dilated mid small bowel loops with air-fluid levels, with no focal small bowel caliber transition. Fluid levels in the proximal and mid colon. Findings are more suggestive of a mild adynamic ileus. 4. Small volume perihepatic ascites. 5. Mild-to-moderate bibasilar atelectasis. 6.  Aortic Atherosclerosis (ICD10-I70.0).  Coronary atherosclerosis. Electronically Signed   By: Delbert Phenix M.D.   On: 01/30/2018 18:59   US Abdomen  Limited  Result Date: 01/30/2018 CLINICAL DATA:  Right upper quadrant pain for several days EXAM: ULTRASOUND ABDOMEN LIMITED RIGHT UPPER QUADRANT COMPARISON:  None. FINDINGS: Gallbladder: Gallbladder is well distended with echogenic sludge. The wall is difficult to distinguish but appears thickened in some areas. No definitive calculi are  seen. Common bile duct: Diameter: 5 mm. Liver: Diffusely heterogeneous without focal mass. These changes likely represent fatty infiltration. Portal vein is patent on color Doppler imaging with normal direction of blood flow towards the liver. Minimal ascites is noted. IMPRESSION: Well distended gallbladder with diffuse echogenic sludge within. No definitive calculi are seen. Wall thickening is noted with a positive sonographic Murphy sign consistent with acute cholecystitis. Minimal ascites. Heterogeneous changes in the liver likely related to fatty infiltration. No mass is seen. Electronically Signed   By: Alcide CleverMark  Lukens M.D.   On: 01/30/2018 16:58   Ir Perc Cholecystostomy  Result Date: 01/31/2018 CLINICAL DATA:  Acute cholecystitis EXAM: PERCUTANEOUS CHOLECYSTOSTOMY TUBE PLACEMENT WITH ULTRASOUND AND FLUOROSCOPIC GUIDANCE FLUOROSCOPY TIME:  1.2 minutes; 728 uGym2 DAP TECHNIQUE: The procedure, risks (including but not limited to bleeding, infection, organ damage ), benefits, and alternatives were explained to the patient. Questions regarding the procedure were encouraged and answered. The patient understands and consents to the procedure. Survey ultrasound of the abdomen was performed and an appropriate skin entry site was identified. Skin site was marked, prepped with Betadine, and draped in usual sterile fashion, and infiltrated locally with 1% lidocaine. Intravenous Fentanyl and Versed were administered as conscious sedation during continuous monitoring of the patient's level of consciousness and physiological / cardiorespiratory status by the radiology RN, with a total moderate sedation time of 14 minutes. Under real-time ultrasound guidance, gallbladder was accessed using a transhepatic approach with a 21-gauge needle. Ultrasound image documentation was saved. Bile returned through the hub. Needle was exchanged over a 018 guidewire for transitional dilator which allowed placement of 035 J wire. Over this,  a 10.2 French pigtail catheter was advanced and formed centrally in the gallbladder lumen. Small contrast injection confirmed appropriate position. Catheter secured externally with 0 Prolene suture and placed external drain bag. Patient tolerated the procedure well. COMPLICATIONS: COMPLICATIONS none IMPRESSION: 1. Technically successful percutaneous cholecystostomy tube placement with ultrasound and fluoroscopic guidance. Electronically Signed   By: Corlis Leak  Hassell M.D.   On: 01/31/2018 13:42    Scheduled Meds: . amLODipine  10 mg Oral Daily  . famotidine  20 mg Oral QHS  . heparin injection (subcutaneous)  5,000 Units Subcutaneous Q8H  . insulin aspart  0-9 Units Subcutaneous Q4H  . ketorolac  1 drop Left Eye QHS  . sodium chloride flush  5 mL Intracatheter Q8H  . timolol  1 drop Both Eyes BID   Continuous Infusions: . 0.9 % NaCl with KCl 40 mEq / L 100 mL/hr (02/01/18 0522)  . piperacillin-tazobactam (ZOSYN)  IV Stopped (02/01/18 1016)    Principal Problem:   Acute cholecystitis Active Problems:   Diabetes mellitus without complication (HCC)   Hypertension   AKI (acute kidney injury) (HCC)   Normocytic anemia    Time spent: >35 minutes     Esperanza SheetsBURIEV, Livia Tarr N  [facility] Pager [phone]. If 7PM-7AM, please contact night-coverage at [email or web address], password [email or web address] 02/01/2018, 11:56 AM  LOS: 2 days

## 2018-02-01 NOTE — Progress Notes (Signed)
[facility] Progress Note      Assessment/Plan Principal Problem:   Acute cholecystitis Active Problems:   Diabetes mellitus without complication (HCC)   Hypertension   AKI (acute kidney injury) (HCC)   Normocytic anemia  Acute Cholecystitis  - IR perc chole drain placed 03/10 - Minimal black drainage noted, continue to measure  - Upon discharge, pt will follow up with Dr. Carolynne Edouardoth in 6-8 weeks for lap chole  FEN: diet advanced to full liquids 03/11 ID: Zosyn 03/09>>  DISPO: continue to advance diet as tolerated; control pain   LOS: 2 days    Subjective: 72 yo pleasant male who reports he is feeling much better today: decreased abdominal bloating, no nausea,  vomiting, fevers or chills. He still complains of mild RUQ pain on palpation. He has passed flatus, but no BM since yesterday. He is tolerating thin liquids well and is requesting food - diet advanced to full liquids today.   Objective: Vital signs in last 24 hours: Temp:  [98 F (36.7 C)-99 F (37.2 C)] 98 F (36.7 C) (03/11 0528) Pulse Rate:  [77-98] 84 (03/11 0528) Resp:  [14-21] 15 (03/11 0528) BP: (124-162)/(64-88) 162/73 (03/11 0528) SpO2:  [94 %-100 %] 98 % (03/11 0528) Last BM Date: 01/30/18(Per pt report)  Intake/Output from previous day: 03/10 0701 - 03/11 0700 In: 2370 [P.O.:780; I.V.:1330; IV Piggyback:250] Out: 355 [Urine:350; Drains:5] Intake/Output this shift: Total I/O In: 460 [P.O.:460] Out: 125 [Urine:125]  PE: Gen:  Alert, NAD, pleasant, cooperative Card:  RRR, no M/G/R heard, 2 + radial pulses bilaterally Pulm:  CTA, no W/R/R, effort normal KGM:WNUUVOAbd:mildly distended, mild TTP in RUQ +BS in all 4 quadrants, perc chole drain intact, draining black fluid, no abdominal scars noted Skin: no rashes noted, warm and dry   Anti-infectives: Anti-infectives (From admission, onward)   Start     Dose/Rate Route Frequency Ordered Stop   01/31/18 0600  piperacillin-tazobactam (ZOSYN)  IVPB 3.375 g     3.375 g 12.5 mL/hr over 240 Minutes Intravenous Every 8 hours 01/30/18 2100     01/30/18 2100  piperacillin-tazobactam (ZOSYN) IVPB 3.375 g     3.375 g 100 mL/hr over 30 Minutes Intravenous STAT 01/30/18 2059 01/30/18 2142      Lab Results:  Recent Labs    01/31/18 0549 02/01/18 0529  WBC 17.0* 12.6*  HGB 11.1* 11.5*  HCT 33.6* 34.9*  PLT 229 218   BMET Recent Labs    01/31/18 0549 02/01/18 0529  NA 137 137  K 3.3* 3.7  CL 106 108  CO2 23 21*  GLUCOSE 133* 123*  BUN 39* 33*  CREATININE 1.95* 1.74*  CALCIUM 8.2* 8.2*   PT/INR Recent Labs    01/31/18 0842  LABPROT 13.2  INR 1.01   CMP     Component Value Date/Time   NA 137 02/01/2018 0529   K 3.7 02/01/2018 0529   CL 108 02/01/2018 0529   CO2 21 (L) 02/01/2018 0529   GLUCOSE 123 (H) 02/01/2018 0529   BUN 33 (H) 02/01/2018 0529   CREATININE 1.74 (H) 02/01/2018 0529   CALCIUM 8.2 (L) 02/01/2018 0529   PROT 6.9 02/01/2018 0529   ALBUMIN 2.3 (L) 02/01/2018 0529   AST 29 02/01/2018 0529   ALT 33 02/01/2018 0529   ALKPHOS 91 02/01/2018 0529   BILITOT 0.6 02/01/2018 0529   GFRNONAA 37 (L) 02/01/2018 0529   GFRAA 43 (L) 02/01/2018 0529   Lipase     Component Value Date/Time  LIPASE 20 01/30/2018 1547    Studies/Results: Ct Abdomen Pelvis Wo Contrast  Result Date: 01/30/2018 CLINICAL DATA:  Nausea, vomiting, right lower quadrant pain and abdominal distention. EXAM: CT ABDOMEN AND PELVIS WITHOUT CONTRAST TECHNIQUE: Multidetector CT imaging of the abdomen and pelvis was performed following the standard protocol without IV contrast. COMPARISON:  01/30/2018 abdominal sonogram FINDINGS: Lower chest: Mild-to-moderate bibasilar atelectasis, right greater than left. Three-vessel coronary atherosclerosis. Hepatobiliary: Normal liver size. No liver mass. Distended gallbladder is filled with sludge and gallstones measuring up to 0.9 cm. Mild diffuse gallbladder wall thickening with haziness of the  pericholecystic fat. No biliary ductal dilatation. Pancreas: Normal, with no mass or duct dilation. Spleen: Normal size. No mass. Adrenals/Urinary Tract: Normal adrenals. No contour deforming renal masses. No renal stones. No hydronephrosis. Normal bladder. Stomach/Bowel: Normal non-distended stomach. Nonspecific mild wall thickening throughout descending duodenum with associated peri duodenal fat stranding. A few mildly dilated small bowel loops with associated fluid levels are noted in anterior abdomen measuring up to the 3.2 cm diameter. Distal small bowel is collapsed. No discrete small bowel caliber transition. No additional sites of small bowel wall thickening. Normal appendix. Mild scattered colonic diverticulosis. No large bowel wall thickening. Fluid levels throughout right and transverse colon. Vascular/Lymphatic: Atherosclerotic nonaneurysmal abdominal aorta. No pathologically enlarged lymph nodes in the abdomen or pelvis. Reproductive: Normal size prostate. Other: No pneumoperitoneum. Small volume perihepatic ascites. No focal fluid collection. Small fat containing umbilical hernia. Mild symmetric gynecomastia. Hypodense superficial subcutaneous 2.0 cm mass in the ventral medial right lower abdominal wall (series 2/image 84), nonspecific, potentially a sebaceous cyst. Musculoskeletal: No aggressive appearing focal osseous lesions. Moderate lumbar spondylosis. IMPRESSION: 1. Distended mildly thick-walled gallbladder filled with sludge and gallstones. Pericholecystic fat haziness. Acute cholecystitis not excluded. No biliary ductal dilatation. 2. Nonspecific wall thickening in the descending duodenum with periduodenal fat stranding. Findings could be reactive, although intrinsic duodenitis/peptic ulcer disease not excluded. 3. Mildly dilated mid small bowel loops with air-fluid levels, with no focal small bowel caliber transition. Fluid levels in the proximal and mid colon. Findings are more suggestive of  a mild adynamic ileus. 4. Small volume perihepatic ascites. 5. Mild-to-moderate bibasilar atelectasis. 6.  Aortic Atherosclerosis (ICD10-I70.0).  Coronary atherosclerosis. Electronically Signed   By: Delbert Phenix M.D.   On: 01/30/2018 18:59   US Abdomen Limited  Result Date: 01/30/2018 CLINICAL DATA:  Right upper quadrant pain for several days EXAM: ULTRASOUND ABDOMEN LIMITED RIGHT UPPER QUADRANT COMPARISON:  None. FINDINGS: Gallbladder: Gallbladder is well distended with echogenic sludge. The wall is difficult to distinguish but appears thickened in some areas. No definitive calculi are seen. Common bile duct: Diameter: 5 mm. Liver: Diffusely heterogeneous without focal mass. These changes likely represent fatty infiltration. Portal vein is patent on color Doppler imaging with normal direction of blood flow towards the liver. Minimal ascites is noted. IMPRESSION: Well distended gallbladder with diffuse echogenic sludge within. No definitive calculi are seen. Wall thickening is noted with a positive sonographic Murphy sign consistent with acute cholecystitis. Minimal ascites. Heterogeneous changes in the liver likely related to fatty infiltration. No mass is seen. Electronically Signed   By: Alcide Clever M.D.   On: 01/30/2018 16:58   Ir Perc Cholecystostomy  Result Date: 01/31/2018 CLINICAL DATA:  Acute cholecystitis EXAM: PERCUTANEOUS CHOLECYSTOSTOMY TUBE PLACEMENT WITH ULTRASOUND AND FLUOROSCOPIC GUIDANCE FLUOROSCOPY TIME:  1.2 minutes; 728 uGym2 DAP TECHNIQUE: The procedure, risks (including but not limited to bleeding, infection, organ damage ), benefits, and alternatives were explained to the  patient. Questions regarding the procedure were encouraged and answered. The patient understands and consents to the procedure. Survey ultrasound of the abdomen was performed and an appropriate skin entry site was identified. Skin site was marked, prepped with Betadine, and draped in usual sterile fashion, and  infiltrated locally with 1% lidocaine. Intravenous Fentanyl and Versed were administered as conscious sedation during continuous monitoring of the patient's level of consciousness and physiological / cardiorespiratory status by the radiology RN, with a total moderate sedation time of 14 minutes. Under real-time ultrasound guidance, gallbladder was accessed using a transhepatic approach with a 21-gauge needle. Ultrasound image documentation was saved. Bile returned through the hub. Needle was exchanged over a 018 guidewire for transitional dilator which allowed placement of 035 J wire. Over this, a 10.2 French pigtail catheter was advanced and formed centrally in the gallbladder lumen. Small contrast injection confirmed appropriate position. Catheter secured externally with 0 Prolene suture and placed external drain bag. Patient tolerated the procedure well. COMPLICATIONS: COMPLICATIONS none IMPRESSION: 1. Technically successful percutaneous cholecystostomy tube placement with ultrasound and fluoroscopic guidance. Electronically Signed   By: Corlis Leak M.D.   On: 01/31/2018 13:42    Nathanial Rancher , PA-S [facility] 02/01/2018, 8:56 AM Consults: [phone]

## 2018-02-02 ENCOUNTER — Encounter (HOSPITAL_COMMUNITY): Payer: Self-pay | Admitting: *Deleted

## 2018-02-02 LAB — GLUCOSE, CAPILLARY
Glucose-Capillary: 101 mg/dL — ABNORMAL HIGH (ref 65–99)
Glucose-Capillary: 117 mg/dL — ABNORMAL HIGH (ref 65–99)
Glucose-Capillary: 118 mg/dL — ABNORMAL HIGH (ref 65–99)

## 2018-02-02 LAB — BASIC METABOLIC PANEL
Anion gap: 11 (ref 5–15)
BUN: 23 mg/dL — ABNORMAL HIGH (ref 6–20)
CO2: 19 mmol/L — ABNORMAL LOW (ref 22–32)
Calcium: 8.1 mg/dL — ABNORMAL LOW (ref 8.9–10.3)
Chloride: 108 mmol/L (ref 101–111)
Creatinine, Ser: 1.61 mg/dL — ABNORMAL HIGH (ref 0.61–1.24)
GFR calc Af Amer: 47 mL/min — ABNORMAL LOW (ref >60)
GFR calc non Af Amer: 41 mL/min — ABNORMAL LOW (ref >60)
Glucose, Bld: 119 mg/dL — ABNORMAL HIGH (ref 65–99)
Potassium: 3.8 mmol/L (ref 3.5–5.1)
Sodium: 138 mmol/L (ref 135–145)

## 2018-02-02 MED ORDER — GLIMEPIRIDE 4 MG PO TABS: 4.0000 mg | ORAL_TABLET | Freq: Every day | ORAL | 0 refills | Status: DC

## 2018-02-02 MED ORDER — AMLODIPINE BESYLATE 10 MG PO TABS: 10.0000 mg | ORAL_TABLET | Freq: Every day | ORAL | 0 refills | Status: DC

## 2018-02-02 MED ORDER — AMOXICILLIN-POT CLAVULANATE 875-125 MG PO TABS: 1.0000 | ORAL_TABLET | Freq: Two times a day (BID) | ORAL | 0 refills | Status: AC

## 2018-02-02 MED ORDER — ACETAMINOPHEN 325 MG PO TABS: 650.0000 mg | ORAL_TABLET | Freq: Four times a day (QID) | ORAL | Status: DC | PRN

## 2018-02-02 NOTE — Discharge Instructions (Signed)
Cholecystostomy, Care After Refer to this sheet in the next few weeks. These instructions provide you with information about caring for yourself after your procedure. Your health care provider may also give you more specific instructions. Your treatment has been planned according to current medical practices, but problems sometimes occur. Call your health care provider if you have any problems or questions after your procedure. What can I expect after the procedure? After your procedure, it is common to have soreness near the site of your drainage tube (catheter) or your incision. Follow these instructions at home: Incision care  Follow instructions from your health care provider about how to take care of your incision. Make sure you: ? Wash your hands with soap and water before you change your bandage (dressing). If soap and water are not available, use hand sanitizer. ? Change your dressing as told by your health care provider. ? Leave stitches (sutures), skin glue, or adhesive strips in place. These skin closures may need to be in place for 2 weeks or longer. If adhesive strip edges start to loosen and curl up, you may trim the loose edges. Do not remove adhesive strips completely unless your health care provider tells you to do that.  Check your incision and your drainage site every day for signs of infection. Watch for: ? Redness, swelling, or pain. ? Fluid, blood, or pus. General instructions  If you were sent home with a surgical drain in place, follow instructions from your health care provider about how to care for your drain and collection bag at home.  Do not take baths, swim, or use a hot tub until your health care provider approves. Ask your health care provider if you can take showers. You may only be allowed to take sponge baths for bathing.  Follow instructions from your health care provider about what you may eat or drink.  Take over-the-counter and prescription medicines only  as told by your health care provider.  Keep all follow-up visits as told by your health care provider. This is important. Contact a health care provider if:  You have redness, swelling, or pain at your incision or drainage site.  You have nausea or vomiting. Get help right away if:  Your abdominal pain gets worse.  You feel dizzy or you faint while standing.  You have fluid, blood, or pus coming from your incision or drainage site.  You have a fever.  You have shortness of breath.  You have a rapid heartbeat.  Your nausea or vomiting does not go away.  Your drainage tube becomes blocked.  Your drainage tube comes out of your abdomen. This information is not intended to replace advice given to you by your health care provider. Make sure you discuss any questions you have with your health care provider. Document Released: 08/01/2015 Document Revised: 04/17/2016 Document Reviewed: 02/21/2015 Elsevier Interactive Patient Education  2018 [employer]  

## 2018-02-02 NOTE — Care Management Important Message (Signed)
Important Message  Patient Details  Name: Jeremiah [name] MRN: [id number] Date of Birth: 1946/05/13   Medicare Important Message Given:  Yes    Caren MacadamFuller, Debara Kamphuis 02/02/2018, 11:28 AMImportant Message  Patient Details  Name: Jeremiah [name] MRN: [id number] Date of Birth: 1946/05/13   Medicare Important Message Given:  Yes    Caren MacadamFuller, Darnice Comrie 02/02/2018, 11:28 AM

## 2018-02-02 NOTE — Progress Notes (Signed)
Subjective/Chief Complaint: No complaints. Feels better   Objective: Vital signs in last 24 hours: Temp:  [98.5 F (36.9 C)-99 F (37.2 C)] 99 F (37.2 C) (03/12 0512) Pulse Rate:  [82-97] 82 (03/12 0512) Resp:  [15-18] 15 (03/12 0512) BP: (132-163)/(54-69) 139/54 (03/12 0512) SpO2:  [94 %-98 %] 98 % (03/12 0512) Last BM Date: 02/02/18  Intake/Output from previous day: 03/11 0701 - 03/12 0700 In: 4145 [P.O.:1540; I.V.:2455; IV Piggyback:150] Out: 1013 [Urine:975; Drains:37; Stool:1] Intake/Output this shift: Total I/O In: 130 [P.O.:120; Other:10] Out: -   General appearance: alert and cooperative Resp: clear to auscultation bilaterally Cardio: regular rate and rhythm GI: soft, minimal tenderness. drain output bilious  Lab Results:  Recent Labs    01/31/18 0549 02/01/18 0529  WBC 17.0* 12.6*  HGB 11.1* 11.5*  HCT 33.6* 34.9*  PLT 229 218   BMET Recent Labs    02/01/18 0529 02/02/18 0535  NA 137 138  K 3.7 3.8  CL 108 108  CO2 21* 19*  GLUCOSE 123* 119*  BUN 33* 23*  CREATININE 1.74* 1.61*  CALCIUM 8.2* 8.1*   PT/INR Recent Labs    01/31/18 0842  LABPROT 13.2  INR 1.01   ABG No results for input(s): PHART, HCO3 in the last 72 hours.  Invalid input(s): PCO2, PO2  Studies/Results: Ir Perc Cholecystostomy  Result Date: 01/31/2018 CLINICAL DATA:  Acute cholecystitis EXAM: PERCUTANEOUS CHOLECYSTOSTOMY TUBE PLACEMENT WITH ULTRASOUND AND FLUOROSCOPIC GUIDANCE FLUOROSCOPY TIME:  1.2 minutes; 728 uGym2 DAP TECHNIQUE: The procedure, risks (including but not limited to bleeding, infection, organ damage ), benefits, and alternatives were explained to the patient. Questions regarding the procedure were encouraged and answered. The patient understands and consents to the procedure. Survey ultrasound of the abdomen was performed and an appropriate skin entry site was identified. Skin site was marked, prepped with Betadine, and draped in usual sterile fashion,  and infiltrated locally with 1% lidocaine. Intravenous Fentanyl and Versed were administered as conscious sedation during continuous monitoring of the patient's level of consciousness and physiological / cardiorespiratory status by the radiology RN, with a total moderate sedation time of 14 minutes. Under real-time ultrasound guidance, gallbladder was accessed using a transhepatic approach with a 21-gauge needle. Ultrasound image documentation was saved. Bile returned through the hub. Needle was exchanged over a 018 guidewire for transitional dilator which allowed placement of 035 J wire. Over this, a 10.2 French pigtail catheter was advanced and formed centrally in the gallbladder lumen. Small contrast injection confirmed appropriate position. Catheter secured externally with 0 Prolene suture and placed external drain bag. Patient tolerated the procedure well. COMPLICATIONS: COMPLICATIONS none IMPRESSION: 1. Technically successful percutaneous cholecystostomy tube placement with ultrasound and fluoroscopic guidance. Electronically Signed   By: Corlis Leak M.D.   On: 01/31/2018 13:42    Anti-infectives: Anti-infectives (From admission, onward)   Start     Dose/Rate Route Frequency Ordered Stop   02/02/18 0000  amoxicillin-clavulanate (AUGMENTIN) 875-125 MG tablet     1 tablet Oral 2 times daily 02/02/18 0909 02/09/18 2359   01/31/18 0600  piperacillin-tazobactam (ZOSYN) IVPB 3.375 g     3.375 g 12.5 mL/hr over 240 Minutes Intravenous Every 8 hours 01/30/18 2100     01/30/18 2100  piperacillin-tazobactam (ZOSYN) IVPB 3.375 g     3.375 g 100 mL/hr over 30 Minutes Intravenous STAT 01/30/18 2059 01/30/18 2142      Assessment/Plan: s/p * No surgery found * Advance diet  Continue abx until wbc normalizes, then switch  to oral abx and consider d/c Will need to follow up with me and with IR drain clinic  LOS: 3 days    TOTH III,PAUL S 02/02/2018

## 2018-02-02 NOTE — Progress Notes (Signed)
Referring Physician(s): Toth,P  Supervising Physician: Simonne Come  Patient Status:  [facility] - In-pt  Chief Complaint:  Cholecystitis  Subjective: Patient without new complaints, currently eating lunch.  Anxious to go home.  Allergies: Patient has no known allergies.  Medications: Prior to Admission medications   Medication Sig Start Date End Date Taking? Authorizing Provider  aspirin 81 MG tablet Take 81 mg by mouth daily.   Yes [provider]  brimonidine (ALPHAGAN) 0.2 % ophthalmic solution Place 1 drop into both eyes 2 (two) times daily.    Yes [provider]  JARDIANCE 10 MG TABS tablet Take 10 mg by mouth every morning. 01/04/18  Yes [provider]  losartan (COZAAR) 100 MG tablet Take 100 mg by mouth daily. 01/27/18  Yes [provider]  Multiple Vitamin (MULTIVITAMIN) tablet Take 1 tablet by mouth daily.   Yes [provider]  pioglitazone (ACTOS) 30 MG tablet Take 30 mg by mouth daily. 01/07/18  Yes [provider]  simvastatin (ZOCOR) 40 MG tablet Take 40 mg by mouth daily.   Yes [provider]  timolol (TIMOPTIC) 0.5 % ophthalmic solution Place 1 drop into both eyes 2 (two) times daily. 01/27/18  Yes [provider]  acetaminophen (TYLENOL) 325 MG tablet Take 2 tablets (650 mg total) by mouth every 6 (six) hours as needed for mild pain (or Fever >/= 101). 02/02/18   Esperanza Sheets, MD  amLODipine (NORVASC) 10 MG tablet Take 1 tablet (10 mg total) by mouth daily. 02/02/18   Esperanza Sheets, MD  amoxicillin-clavulanate (AUGMENTIN) 875-125 MG tablet Take 1 tablet by mouth 2 (two) times daily for 7 days. 02/02/18 02/09/18  Esperanza Sheets, MD  glimepiride (AMARYL) 4 MG tablet Take 1 tablet (4 mg total) by mouth daily with breakfast. 02/02/18   York Spaniel, Isaiah Serge, MD     Vital Signs: BP (!) 139/54 (BP Location: Right Arm)   Pulse 82   Temp 99 F (37.2 C) (Oral)   Resp 15   Ht 5\' 9"  (1.753 m)   Wt  199 lb 12.8 oz (90.6 kg)   SpO2 98%   BMI 29.51 kg/m   Physical Exam gallbladder drain intact, insertion site okay, minimal tenderness.  Output 40 cc dark bile; drain irrigated with normal saline without difficulty.  Imaging: Ct Abdomen Pelvis Wo Contrast  Result Date: 01/30/2018 CLINICAL DATA:  Nausea, vomiting, right lower quadrant pain and abdominal distention. EXAM: CT ABDOMEN AND PELVIS WITHOUT CONTRAST TECHNIQUE: Multidetector CT imaging of the abdomen and pelvis was performed following the standard protocol without IV contrast. COMPARISON:  01/30/2018 abdominal sonogram FINDINGS: Lower chest: Mild-to-moderate bibasilar atelectasis, right greater than left. Three-vessel coronary atherosclerosis. Hepatobiliary: Normal liver size. No liver mass. Distended gallbladder is filled with sludge and gallstones measuring up to 0.9 cm. Mild diffuse gallbladder wall thickening with haziness of the pericholecystic fat. No biliary ductal dilatation. Pancreas: Normal, with no mass or duct dilation. Spleen: Normal size. No mass. Adrenals/Urinary Tract: Normal adrenals. No contour deforming renal masses. No renal stones. No hydronephrosis. Normal bladder. Stomach/Bowel: Normal non-distended stomach. Nonspecific mild wall thickening throughout descending duodenum with associated peri duodenal fat stranding. A few mildly dilated small bowel loops with associated fluid levels are noted in anterior abdomen measuring up to the 3.2 cm diameter. Distal small bowel is collapsed. No discrete small bowel caliber transition. No additional sites of small bowel wall thickening. Normal appendix. Mild scattered colonic diverticulosis. No large bowel wall thickening. Fluid  levels throughout right and transverse colon. Vascular/Lymphatic: Atherosclerotic nonaneurysmal abdominal aorta. No pathologically enlarged lymph nodes in the abdomen or pelvis. Reproductive: Normal size prostate. Other: No pneumoperitoneum. Small volume  perihepatic ascites. No focal fluid collection. Small fat containing umbilical hernia. Mild symmetric gynecomastia. Hypodense superficial subcutaneous 2.0 cm mass in the ventral medial right lower abdominal wall (series 2/image 84), nonspecific, potentially a sebaceous cyst. Musculoskeletal: No aggressive appearing focal osseous lesions. Moderate lumbar spondylosis. IMPRESSION: 1. Distended mildly thick-walled gallbladder filled with sludge and gallstones. Pericholecystic fat haziness. Acute cholecystitis not excluded. No biliary ductal dilatation. 2. Nonspecific wall thickening in the descending duodenum with periduodenal fat stranding. Findings could be reactive, although intrinsic duodenitis/peptic ulcer disease not excluded. 3. Mildly dilated mid small bowel loops with air-fluid levels, with no focal small bowel caliber transition. Fluid levels in the proximal and mid colon. Findings are more suggestive of a mild adynamic ileus. 4. Small volume perihepatic ascites. 5. Mild-to-moderate bibasilar atelectasis. 6.  Aortic Atherosclerosis (ICD10-I70.0).  Coronary atherosclerosis. Electronically Signed   By: Delbert PhenixJason A Poff M.D.   On: 01/30/2018 18:59   [place] Abdomen Limited  Result Date: 01/30/2018 CLINICAL DATA:  Right upper quadrant pain for several days EXAM: ULTRASOUND ABDOMEN LIMITED RIGHT UPPER QUADRANT COMPARISON:  None. FINDINGS: Gallbladder: Gallbladder is well distended with echogenic sludge. The wall is difficult to distinguish but appears thickened in some areas. No definitive calculi are seen. Common bile duct: Diameter: 5 mm. Liver: Diffusely heterogeneous without focal mass. These changes likely represent fatty infiltration. Portal vein is patent on color Doppler imaging with normal direction of blood flow towards the liver. Minimal ascites is noted. IMPRESSION: Well distended gallbladder with diffuse echogenic sludge within. No definitive calculi are seen. Wall thickening is noted with a positive  sonographic Murphy sign consistent with acute cholecystitis. Minimal ascites. Heterogeneous changes in the liver likely related to fatty infiltration. No mass is seen. Electronically Signed   By: Alcide CleverMark  Lukens M.D.   On: 01/30/2018 16:58   Ir Perc Cholecystostomy  Result Date: 01/31/2018 CLINICAL DATA:  Acute cholecystitis EXAM: PERCUTANEOUS CHOLECYSTOSTOMY TUBE PLACEMENT WITH ULTRASOUND AND FLUOROSCOPIC GUIDANCE FLUOROSCOPY TIME:  1.2 minutes; 728 uGym2 DAP TECHNIQUE: The procedure, risks (including but not limited to bleeding, infection, organ damage ), benefits, and alternatives were explained to the patient. Questions regarding the procedure were encouraged and answered. The patient understands and consents to the procedure. Survey ultrasound of the abdomen was performed and an appropriate skin entry site was identified. Skin site was marked, prepped with Betadine, and draped in usual sterile fashion, and infiltrated locally with 1% lidocaine. Intravenous Fentanyl and Versed were administered as conscious sedation during continuous monitoring of the patient's level of consciousness and physiological / cardiorespiratory status by the radiology RN, with a total moderate sedation time of 14 minutes. Under real-time ultrasound guidance, gallbladder was accessed using a transhepatic approach with a 21-gauge needle. Ultrasound image documentation was saved. Bile returned through the hub. Needle was exchanged over a 018 guidewire for transitional dilator which allowed placement of 035 J wire. Over this, a 10.2 French pigtail catheter was advanced and formed centrally in the gallbladder lumen. Small contrast injection confirmed appropriate position. Catheter secured externally with 0 Prolene suture and placed external drain bag. Patient tolerated the procedure well. COMPLICATIONS: COMPLICATIONS none IMPRESSION: 1. Technically successful percutaneous cholecystostomy tube placement with ultrasound and fluoroscopic  guidance. Electronically Signed   By: Corlis Leak  Hassell M.D.   On: 01/31/2018 13:42    Labs:  CBC: Recent Labs  01/30/18 1547 01/31/18 0549 02/01/18 0529  WBC 22.6* 17.0* 12.6*  HGB 12.6* 11.1* 11.5*  HCT 36.5* 33.6* 34.9*  PLT 241 229 218    COAGS: Recent Labs    01/31/18 0842  INR 1.01    BMP: Recent Labs    01/30/18 1547 01/31/18 0549 02/01/18 0529 02/02/18 0535  NA 136 137 137 138  K 4.4 3.3* 3.7 3.8  CL 100* 106 108 108  CO2 24 23 21* 19*  GLUCOSE 302* 133* 123* 119*  BUN 38* 39* 33* 23*  CALCIUM 8.9 8.2* 8.2* 8.1*  CREATININE 2.23* 1.95* 1.74* 1.61*  GFRNONAA 28* 32* 37* 41*  GFRAA 32* 38* 43* 47*    LIVER FUNCTION TESTS: Recent Labs    01/30/18 1547 01/31/18 0549 02/01/18 0529  BILITOT 1.3* 1.0 0.6  AST 50* 26 29  ALT 53 38 33  ALKPHOS 112 95 91  PROT 8.1 6.9 6.9  ALBUMIN 2.8* 2.4* 2.3*    Assessment and Plan: Acute cholecystitis , s/p perc cholecystostomy 3/10; afebrile; creatinine 1.61;bile culture negative to date; blood culture negative to date;  drain will need to remain in place at least 4-6 weeks unless cholecystectomy done in interim; recommend once daily irrigation of drain with 5-10 cc sterile normal saline, output recording and dressing changes every 1-2 days; he will be scheduled for follow-up gallbladder drain injection in IR clinic in 4 weeks.  Patient may call [phone] or [phone] with any drain questions and ask to speak with interventional radiologist on call.       Electronically Signed: D. Jeananne Rama, PA-C 02/02/2018, 12:40 PM   I spent a total of 15 minutes at the the patient's bedside AND on the patient's hospital floor or unit, greater than 50% of which was counseling/coordinating care for gallbladder drain    Patient ID: Jeremiah [name], male   DOB: 12-25-1945, 72 y.o.   MRN: [id number]

## 2018-02-02 NOTE — Discharge Planning (Signed)
Patient being d/ced home with his wife Will f/u with pcp end of this week and f/u with surgery end of next week

## 2018-02-02 NOTE — Discharge Summary (Signed)
Physician Discharge Summary  Jeremiah [name]:[id number] DOB: 05/07/1946 DOA: 01/30/2018  PCP: Jeremiah GuilesSpruill, [place], Jeremiah [name]  Admit date: 01/30/2018 Discharge date: 02/02/2018  Time spent: >35 minutes  Recommendations for Outpatient Follow-up:  F/u with surgery in 1-2 weeks. Needs planned cholecystectomy F/u with PCP in 3-7 days    Discharge Diagnoses:  Principal Problem:   Acute cholecystitis Active Problems:   Diabetes mellitus without complication (HCC)   Hypertension   AKI (acute kidney injury) (HCC)   Normocytic anemia   Discharge Condition: stable   Diet recommendation: carb modified   Filed Weights   01/30/18 1506 01/30/18 2200 01/31/18 0428  Weight: 91.6 kg (202 lb) 90.5 kg (199 lb 8.3 oz) 90.6 kg (199 lb 12.8 oz)    History of present illness:   72 year old with past medical history relevant for hypertension, diabetes and dyslipidemia who presents with almost 6-day history of abdominal pain.Patient was admitted on 01/30/2018, workup consistent with acute cholecystitis,given duration of symptoms general surgery would prefer to have a percutaneous cholecystectomy drain placed on 01/31/2018 with subsequent elective lap chole in 6-8 weeks   Hospital Course:   AKI-most likely due to dehydration/decreased oral intake compounded by losartan use, admission creatinine up to 2.2, with hydration creatinine is down to 1.65, previously renal function was normal,hold losartan at discharged. F/u with PCP to recheck renal function in 3-7 days   Acutecholecystitis-status post percutaneous cholecystectomy drain placement on 01/31/2018 by interventional radiology, patient was treated with IV Zosyn, patient will f/u with surgery for elective lap chole as outpatient in 6-8 weeks.white count is down from 22,000 on admission. Blood cultures: NGTD. transitioned to oral antibiotic regimen   DM-hold Jardiance due to renal issues. Resume glimepiride. Recommended to f/u with PCP next week    HTN-losartan on hold due to acute kidney injury and dehydration as stated above in #1,restarted amlodipine 10 mg daily,  Procedures:  IR perc chole tube  (i.e. Studies not automatically included, echos, thoracentesis, etc; not x-rays)  Consultations:  IR  Surgery   Discharge Exam: Vitals:   02/01/18 1958 02/02/18 0512  BP: 132/69 (!) 139/54  Pulse: 97 82  Resp: 18 15  Temp: 98.5 F (36.9 C) 99 F (37.2 C)  SpO2: 94% 98%    General: no distress  Cardiovascular: s1,s2 rrr Respiratory: CTA BL  Discharge Instructions  Discharge Instructions    Diet - low sodium heart healthy   Complete by:  As directed    Discharge instructions   Complete by:  As directed    Please follow up with surgery in 1-2 weeks  Please follow up with primary care doctor in 3-7 days   Increase activity slowly   Complete by:  As directed      Allergies as of 02/02/2018   No Known Allergies     Medication List    STOP taking these medications   JARDIANCE 10 MG Tabs tablet Generic drug:  empagliflozin   losartan 100 MG tablet Commonly known as:  COZAAR     TAKE these medications   acetaminophen 325 MG tablet Commonly known as:  TYLENOL Take 2 tablets (650 mg total) by mouth every 6 (six) hours as needed for mild pain (or Fever >/= 101).   amLODipine 10 MG tablet Commonly known as:  NORVASC Take 1 tablet (10 mg total) by mouth daily.   amoxicillin-clavulanate 875-125 MG tablet Commonly known as:  AUGMENTIN Take 1 tablet by mouth 2 (two) times daily for 7 days.   aspirin 81  MG tablet Take 81 mg by mouth daily.   brimonidine 0.2 % ophthalmic solution Commonly known as:  ALPHAGAN Place 1 drop into both eyes 2 (two) times daily.   glimepiride 4 MG tablet Commonly known as:  AMARYL Take 1 tablet (4 mg total) by mouth daily with breakfast. What changed:  when to take this   multivitamin tablet Take 1 tablet by mouth daily.   pioglitazone 30 MG tablet Commonly known as:   ACTOS Take 30 mg by mouth daily.   simvastatin 40 MG tablet Commonly known as:  ZOCOR Take 40 mg by mouth daily.   timolol 0.5 % ophthalmic solution Commonly known as:  TIMOPTIC Place 1 drop into both eyes 2 (two) times daily.      No Known Allergies Follow-up Information    Jeremiah Pretty III, Jeremiah [name]. Schedule an appointment as soon as possible for a visit in 7 week(s).   Specialty:  General Surgery Why:  for follow up to discuss elective gallbladder removal  Contact information: [name] [place] [address] [phone]            The results of significant diagnostics from this hospitalization (including imaging, microbiology, ancillary and laboratory) are listed below for reference.    Significant Diagnostic Studies: Ct Abdomen Pelvis Wo Contrast  Result Date: 01/30/2018 CLINICAL DATA:  Nausea, vomiting, right lower quadrant pain and abdominal distention. EXAM: CT ABDOMEN AND PELVIS WITHOUT CONTRAST TECHNIQUE: Multidetector CT imaging of the abdomen and pelvis was performed following the standard protocol without IV contrast. COMPARISON:  01/30/2018 abdominal sonogram FINDINGS: Lower chest: Mild-to-moderate bibasilar atelectasis, right greater than left. Three-vessel coronary atherosclerosis. Hepatobiliary: Normal liver size. No liver mass. Distended gallbladder is filled with sludge and gallstones measuring up to 0.9 cm. Mild diffuse gallbladder wall thickening with haziness of the pericholecystic fat. No biliary ductal dilatation. Pancreas: Normal, with no mass or duct dilation. Spleen: Normal size. No mass. Adrenals/Urinary Tract: Normal adrenals. No contour deforming renal masses. No renal stones. No hydronephrosis. Normal bladder. Stomach/Bowel: Normal non-distended stomach. Nonspecific mild wall thickening throughout descending duodenum with associated peri duodenal fat stranding. A few mildly dilated small bowel loops with associated fluid levels are noted in  anterior abdomen measuring up to the 3.2 cm diameter. Distal small bowel is collapsed. No discrete small bowel caliber transition. No additional sites of small bowel wall thickening. Normal appendix. Mild scattered colonic diverticulosis. No large bowel wall thickening. Fluid levels throughout right and transverse colon. Vascular/Lymphatic: Atherosclerotic nonaneurysmal abdominal aorta. No pathologically enlarged lymph nodes in the abdomen or pelvis. Reproductive: Normal size prostate. Other: No pneumoperitoneum. Small volume perihepatic ascites. No focal fluid collection. Small fat containing umbilical hernia. Mild symmetric gynecomastia. Hypodense superficial subcutaneous 2.0 cm mass in the ventral medial right lower abdominal wall (series 2/image 84), nonspecific, potentially a sebaceous cyst. Musculoskeletal: No aggressive appearing focal osseous lesions. Moderate lumbar spondylosis. IMPRESSION: 1. Distended mildly thick-walled gallbladder filled with sludge and gallstones. Pericholecystic fat haziness. Acute cholecystitis not excluded. No biliary ductal dilatation. 2. Nonspecific wall thickening in the descending duodenum with periduodenal fat stranding. Findings could be reactive, although intrinsic duodenitis/peptic ulcer disease not excluded. 3. Mildly dilated mid small bowel loops with air-fluid levels, with no focal small bowel caliber transition. Fluid levels in the proximal and mid colon. Findings are more suggestive of a mild adynamic ileus. 4. Small volume perihepatic ascites. 5. Mild-to-moderate bibasilar atelectasis. 6.  Aortic Atherosclerosis (ICD10-I70.0).  Coronary atherosclerosis. Electronically Signed   By: Jeremiah Hose  Poff [name].   On: 01/30/2018 18:59   US Abdomen Limited  Result Date: 01/30/2018 CLINICAL DATA:  Right upper quadrant pain for several days EXAM: ULTRASOUND ABDOMEN LIMITED RIGHT UPPER QUADRANT COMPARISON:  None. FINDINGS: Gallbladder: Gallbladder is well distended with echogenic  sludge. The wall is difficult to distinguish but appears thickened in some areas. No definitive calculi are seen. Common bile duct: Diameter: 5 mm. Liver: Diffusely heterogeneous without focal mass. These changes likely represent fatty infiltration. Portal vein is patent on color Doppler imaging with normal direction of blood flow towards the liver. Minimal ascites is noted. IMPRESSION: Well distended gallbladder with diffuse echogenic sludge within. No definitive calculi are seen. Wall thickening is noted with a positive sonographic Murphy sign consistent with acute cholecystitis. Minimal ascites. Heterogeneous changes in the liver likely related to fatty infiltration. No mass is seen. Electronically Signed   By: Jeremiah Clever [name].   On: 01/30/2018 16:58   Ir Perc Cholecystostomy  Result Date: 01/31/2018 CLINICAL DATA:  Acute cholecystitis EXAM: PERCUTANEOUS CHOLECYSTOSTOMY TUBE PLACEMENT WITH ULTRASOUND AND FLUOROSCOPIC GUIDANCE FLUOROSCOPY TIME:  1.2 minutes; 728 uGym2 DAP TECHNIQUE: The procedure, risks (including but not limited to bleeding, infection, organ damage ), benefits, and alternatives were explained to the patient. Questions regarding the procedure were encouraged and answered. The patient understands and consents to the procedure. Survey ultrasound of the abdomen was performed and an appropriate skin entry site was identified. Skin site was marked, prepped with Betadine, and draped in usual sterile fashion, and infiltrated locally with 1% lidocaine. Intravenous Fentanyl and Versed were administered as conscious sedation during continuous monitoring of the patient's level of consciousness and physiological / cardiorespiratory status by the radiology RN, with a total moderate sedation time of 14 minutes. Under real-time ultrasound guidance, gallbladder was accessed using a transhepatic approach with a 21-gauge needle. Ultrasound image documentation was saved. Bile returned through the hub. Needle  was exchanged over a 018 guidewire for transitional dilator which allowed placement of 035 J wire. Over this, a 10.2 French pigtail catheter was advanced and formed centrally in the gallbladder lumen. Small contrast injection confirmed appropriate position. Catheter secured externally with 0 Prolene suture and placed external drain bag. Patient tolerated the procedure well. COMPLICATIONS: COMPLICATIONS none IMPRESSION: 1. Technically successful percutaneous cholecystostomy tube placement with ultrasound and fluoroscopic guidance. Electronically Signed   By: Jeremiah [name].   On: 01/31/2018 13:42    Microbiology: Recent Results (from the past 240 hour(s))  Culture, blood (routine x 2)     Status: None (Preliminary result)   Collection Time: 01/30/18  8:54 PM  Result Value Ref Range Status   Specimen Description   Final    BLOOD BLOOD RIGHT HAND Performed at [facility], [address], [place], [place] [address]    Special Requests   Final    BOTTLES DRAWN AEROBIC AND ANAEROBIC Blood Culture adequate volume Performed at [facility], [address], [name], [place] [address]    Culture   Final    NO GROWTH 1 DAY Performed at [facility], [name] [place], [place] [address]    Report Status PENDING  Incomplete  Culture, blood (routine x 2)     Status: None (Preliminary result)   Collection Time: 01/30/18  8:54 PM  Result Value Ref Range Status   Specimen Description   Final    BLOOD LEFT ANTECUBITAL Performed at [facility], [address], [place], [place] [address]  Special Requests   Final    BOTTLES DRAWN AEROBIC AND ANAEROBIC Blood Culture adequate volume Performed at [facility], [address], [place], [place] [address]    Culture   Final    NO GROWTH 1 DAY Performed at [facility], [name], [place], [place] [address]    Report Status PENDING  Incomplete  Body fluid  culture     Status: None (Preliminary result)   Collection Time: 02/01/18  1:00 PM  Result Value Ref Range Status   Specimen Description   Final    BILE Performed at [facility], [address], [place], [place] [address]    Special Requests   Final    Normal Performed at [facility], [address] [place], [place] [address]    Gram Stain NO WBC SEEN NO ORGANISMS SEEN   Final   Culture   Final    NO GROWTH < 24 HOURS Performed at [facility], [name], [place], [place] [address]    Report Status PENDING  Incomplete     Labs: Basic Metabolic Panel: Recent Labs  Lab 01/30/18 1547 01/31/18 0549 02/01/18 0529 02/02/18 0535  NA 136 137 137 138  K 4.4 3.3* 3.7 3.8  CL 100* 106 108 108  CO2 24 23 21* 19*  GLUCOSE 302* 133* 123* 119*  BUN 38* 39* 33* 23*  CREATININE 2.23* 1.95* 1.74* 1.61*  CALCIUM 8.9 8.2* 8.2* 8.1*   Liver Function Tests: Recent Labs  Lab 01/30/18 1547 01/31/18 0549 02/01/18 0529  AST 50* 26 29  ALT 53 38 33  ALKPHOS 112 95 91  BILITOT 1.3* 1.0 0.6  PROT 8.1 6.9 6.9  ALBUMIN 2.8* 2.4* 2.3*   Recent Labs  Lab 01/30/18 1547  LIPASE 20   No results for input(s): AMMONIA in the last 168 hours. CBC: Recent Labs  Lab 01/30/18 1547 01/31/18 0549 02/01/18 0529  WBC 22.6* 17.0* 12.6*  NEUTROABS 18.2* 13.3*  --   HGB 12.6* 11.1* 11.5*  HCT 36.5* 33.6* 34.9*  MCV 87.1 87.5 87.5  PLT 241 229 218   Cardiac Enzymes: No results for input(s): CKTOTAL, CKMB, CKMBINDEX, TROPONINI in the last 168 hours. BNP: BNP (last 3 results) No results for input(s): BNP in the last 8760 hours.  ProBNP (last 3 results) No results for input(s): PROBNP in the last 8760 hours.  CBG: Recent Labs  Lab 02/01/18 1752 02/01/18 2006 02/02/18 0024 02/02/18 0416 02/02/18 0736  GLUCAP 167* 119* 118* 117* 101*       Signed:  Jonette Mate [name]  [facility] 02/02/2018, 9:10 AM

## 2018-02-03 ENCOUNTER — Other Ambulatory Visit: Payer: Self-pay | Admitting: General Surgery

## 2018-02-03 DIAGNOSIS — K810 Acute cholecystitis: Secondary | ICD-10-CM

## 2018-02-04 LAB — BODY FLUID CULTURE
Culture: NO GROWTH
Gram Stain: NONE SEEN
Special Requests: NORMAL

## 2018-02-05 LAB — CULTURE, BLOOD (ROUTINE X 2)
Culture: NO GROWTH
Culture: NO GROWTH
Special Requests: ADEQUATE
Special Requests: ADEQUATE

## 2018-02-15 MED FILL — NORMAL SALINE FLUSH SYRINGE: 0.9 | 30 days supply | Qty: 300 | Fill #0

## 2018-03-02 ENCOUNTER — Ambulatory Visit
Admission: RE | Admit: 2018-03-02 | Discharge: 2018-03-02 | Disposition: A | Discharge status: Home / Self Care | Payer: Medicare Other | Source: Ambulatory Visit | Attending: Radiology | Admitting: Radiology

## 2018-03-02 ENCOUNTER — Encounter: Payer: Self-pay | Admitting: Radiology

## 2018-03-02 ENCOUNTER — Ambulatory Visit
Admission: RE | Admit: 2018-03-02 | Discharge: 2018-03-02 | Disposition: A | Discharge status: Home / Self Care | Payer: Medicare Other | Source: Ambulatory Visit | Attending: General Surgery | Admitting: General Surgery

## 2018-03-02 DIAGNOSIS — K810 Acute cholecystitis: Secondary | ICD-10-CM

## 2018-03-02 HISTORY — PX: IR RADIOLOGIST EVAL & MGMT: IMG5224

## 2018-03-02 NOTE — Progress Notes (Signed)
Chief Complaint: Check chole tube  Referring Physician(s): Carolynne Edouardoth  Supervising Physician: Irish LackYamagata, Glenn  History of Present Illness: Jeremiah [name] is a 72 y.o. male who is s/p percutaneous cholecystostomy by Dr. Deanne CofferHassell on 01/31/2018.  He was admitted on 01/30/2018 after 5 days of abdominal pain.  Workup revealed acute cholecystitis.  Dr. Carolynne Edouardoth evaluated him in the ED and his note read: The patient appears to have significant cholecystitis that is been going on almost 5 days.  Given the length of time that this is been occurring in his age and comorbid conditions I think he may benefit more from percutaneous drainage of the gallbladder. We will start him on IV antibiotics and hold any blood thinners for now.  If the percutaneous drainage is successful then we would plan to cool him off for 6-8 weeks with the drain and then plan for an interval cholecystectomy.  I think this is likely to have less blood loss and be more likely to be accomplished laparoscopically for him.   The plan is for laparoscopic cholecystectomy in 6-8 weeks by Dr. Carolynne Edouardoth.  He is here today for injection to evaluate for patency of the cystic duct.  He is unsure if he is still taking antibiotics.   He is flushing the drain daily with about 5 mL of saline flush.  He empties the gravity bag daily but does not record output.  Past Medical History:  Diagnosis Date  . Arthritis   . Diabetes mellitus without complication (HCC)   . Hypertension   . Peripheral vascular disease [facility](HCC)     Past Surgical History:  Procedure Laterality Date  . IR PERC CHOLECYSTOSTOMY  01/31/2018    Allergies: Patient has no known allergies.  Medications: Prior to Admission medications   Medication Sig Start Date End Date Taking? Authorizing Provider  acetaminophen (TYLENOL) 325 MG tablet Take 2 tablets (650 mg total) by mouth every 6 (six) hours as needed for mild pain (or Fever >/= 101). 02/02/18   Esperanza SheetsBuriev, Ulugbek N, MD    amLODipine (NORVASC) 10 MG tablet Take 1 tablet (10 mg total) by mouth daily. 02/02/18   Esperanza SheetsBuriev, Ulugbek N, MD  aspirin 81 MG tablet Take 81 mg by mouth daily.    [provider]  brimonidine (ALPHAGAN) 0.2 % ophthalmic solution Place 1 drop into both eyes 2 (two) times daily.     [provider]  glimepiride (AMARYL) 4 MG tablet Take 1 tablet (4 mg total) by mouth daily with breakfast. 02/02/18   Esperanza SheetsBuriev, Ulugbek N, MD  Multiple Vitamin (MULTIVITAMIN) tablet Take 1 tablet by mouth daily.    [provider]  pioglitazone (ACTOS) 30 MG tablet Take 30 mg by mouth daily. 01/07/18   [provider]  simvastatin (ZOCOR) 40 MG tablet Take 40 mg by mouth daily.    [provider]  timolol (TIMOPTIC) 0.5 % ophthalmic solution Place 1 drop into both eyes 2 (two) times daily. 01/27/18   [provider]     Family History  Problem Relation Age of Onset  . Diabetes Mother   . Hypertension Mother   . Heart disease Father   . Heart attack Father   . Diabetes Sister   . Hypertension Sister   . Diabetes Brother   . Heart disease Brother   . Hypertension Brother   . Heart attack Brother     Social History   Socioeconomic History  . Marital status: Divorced    Spouse name: Not on  file  . Number of children: Not on file  . Years of education: Not on file  . Highest education level: Not on file  Occupational History  . Not on file  Social Needs  . Financial resource strain: Not on file  . Food insecurity:    Worry: Not on file    Inability: Not on file  . Transportation needs:    Medical: Not on file    Non-medical: Not on file  Tobacco Use  . Smoking status: Former Smoker    Last attempt to quit: 11/25/1983    Years since quitting: 34.2  . Smokeless tobacco: Never Used  Substance and Sexual Activity  . Alcohol use: No  . Drug use: No  . Sexual activity: Not on file  Lifestyle  . Physical activity:    Days per week: Not on file     Minutes per session: Not on file  . Stress: Not on file  Relationships  . Social connections:    Talks on phone: Not on file    Gets together: Not on file    Attends religious service: Not on file    Active member of club or organization: Not on file    Attends meetings of clubs or organizations: Not on file    Relationship status: Not on file  Other Topics Concern  . Not on file  Social History Narrative  . Not on file   Review of Systems Vital Signs: There were no vitals taken for this visit.  Physical Exam Awake and alert NAD Chole drain in place RUQ Injection of contrast through the drain reveals some filling defects. There is no visualization of the cystic duct.  Imaging: No results found.  Labs:  CBC: Recent Labs    01/30/18 1547 01/31/18 0549 02/01/18 0529  WBC 22.6* 17.0* 12.6*  HGB 12.6* 11.1* 11.5*  HCT 36.5* 33.6* 34.9*  PLT 241 229 218    COAGS: Recent Labs    01/31/18 0842  INR 1.01    BMP: Recent Labs    01/30/18 1547 01/31/18 0549 02/01/18 0529 02/02/18 0535  NA 136 137 137 138  K 4.4 3.3* 3.7 3.8  CL 100* 106 108 108  CO2 24 23 21* 19*  GLUCOSE 302* 133* 123* 119*  BUN 38* 39* 33* 23*  CALCIUM 8.9 8.2* 8.2* 8.1*  CREATININE 2.23* 1.95* 1.74* 1.61*  GFRNONAA 28* 32* 37* 41*  GFRAA 32* 38* 43* 47*    LIVER FUNCTION TESTS: Recent Labs    01/30/18 1547 01/31/18 0549 02/01/18 0529  BILITOT 1.3* 1.0 0.6  AST 50* 26 29  ALT 53 38 33  ALKPHOS 112 95 91  PROT 8.1 6.9 6.9  ALBUMIN 2.8* 2.4* 2.3*    TUMOR MARKERS: No results for input(s): AFPTM, CEA, CA199, CHROMGRNA in the last 8760 hours.  Assessment:  Acute cholecystitis.  S/P perc chole by Dr. Deanne Coffer on 01/31/2018.  No visualization of the cystic duct during today's injection.  Keep scheduled appointment with Dr.Toth on 03/09/2018 to discuss plans for cholecystectomy.   Electronically Signed: Gwynneth Macleod PA-C 03/02/2018, 1:37 PM   Please refer to Dr.  Antonietta Jewel attestation of this note for management and plan.

## 2018-03-11 ENCOUNTER — Other Ambulatory Visit (HOSPITAL_COMMUNITY): Payer: Self-pay | Admitting: General Surgery

## 2018-03-11 DIAGNOSIS — K819 Cholecystitis, unspecified: Secondary | ICD-10-CM

## 2018-03-16 ENCOUNTER — Other Ambulatory Visit: Payer: Self-pay | Admitting: Physician Assistant

## 2018-03-16 ENCOUNTER — Ambulatory Visit: Payer: Self-pay | Admitting: General Surgery

## 2018-03-17 ENCOUNTER — Encounter (HOSPITAL_COMMUNITY): Payer: Self-pay | Admitting: Interventional Radiology

## 2018-03-17 ENCOUNTER — Ambulatory Visit (HOSPITAL_COMMUNITY)
Admission: RE | Admit: 2018-03-17 | Discharge: 2018-03-17 | Disposition: A | Discharge status: Home / Self Care | Payer: Medicare Other | Source: Ambulatory Visit | Attending: General Surgery | Admitting: General Surgery

## 2018-03-17 DIAGNOSIS — K819 Cholecystitis, unspecified: Secondary | ICD-10-CM

## 2018-03-17 DIAGNOSIS — K820 Obstruction of gallbladder: Secondary | ICD-10-CM | POA: Insufficient documentation

## 2018-03-17 HISTORY — PX: IR CHOLANGIOGRAM EXISTING TUBE: IMG6040

## 2018-03-17 MED ORDER — IOPAMIDOL (ISOVUE-300) INJECTION 61%
INTRAVENOUS | Status: AC
  Administered 2018-03-17: 10 mL
  Filled 2018-03-17: qty 50

## 2018-03-17 NOTE — Procedures (Signed)
Cholecystostomy  Drain is positioned in GB lumen Cystic duct remains occluded EBL 0 Comp 0

## 2018-03-22 MED FILL — NORMAL SALINE FLUSH SYRINGE: 0.9 | 30 days supply | Qty: 300 | Fill #0

## 2018-03-24 DIAGNOSIS — A0472 Enterocolitis due to Clostridium difficile, not specified as recurrent: Secondary | ICD-10-CM

## 2018-03-24 HISTORY — DX: Enterocolitis due to Clostridium difficile, not specified as recurrent: A04.72

## 2018-03-27 ENCOUNTER — Encounter (HOSPITAL_COMMUNITY): Payer: Self-pay | Admitting: *Deleted

## 2018-03-27 ENCOUNTER — Emergency Department (HOSPITAL_COMMUNITY): Payer: Medicare Other

## 2018-03-27 ENCOUNTER — Other Ambulatory Visit: Payer: Self-pay

## 2018-03-27 ENCOUNTER — Inpatient Hospital Stay (HOSPITAL_COMMUNITY)
Admission: EM | Admit: 2018-03-27 | Discharge: 2018-04-01 | DRG: 291 | Disposition: A | Discharge status: Home Health | Payer: Medicare Other | Attending: Cardiovascular Disease | Admitting: Cardiovascular Disease

## 2018-03-27 DIAGNOSIS — Z7982 Long term (current) use of aspirin: Secondary | ICD-10-CM

## 2018-03-27 DIAGNOSIS — Z7984 Long term (current) use of oral hypoglycemic drugs: Secondary | ICD-10-CM

## 2018-03-27 DIAGNOSIS — I509 Heart failure, unspecified: Secondary | ICD-10-CM

## 2018-03-27 DIAGNOSIS — E785 Hyperlipidemia, unspecified: Secondary | ICD-10-CM | POA: Diagnosis present

## 2018-03-27 DIAGNOSIS — E1122 Type 2 diabetes mellitus with diabetic chronic kidney disease: Secondary | ICD-10-CM | POA: Diagnosis present

## 2018-03-27 DIAGNOSIS — I130 Hypertensive heart and chronic kidney disease with heart failure and stage 1 through stage 4 chronic kidney disease, or unspecified chronic kidney disease: Secondary | ICD-10-CM | POA: Diagnosis present

## 2018-03-27 DIAGNOSIS — N183 Chronic kidney disease, stage 3 (moderate): Secondary | ICD-10-CM | POA: Diagnosis present

## 2018-03-27 DIAGNOSIS — I5033 Acute on chronic diastolic (congestive) heart failure: Secondary | ICD-10-CM | POA: Diagnosis present

## 2018-03-27 DIAGNOSIS — Z79899 Other long term (current) drug therapy: Secondary | ICD-10-CM | POA: Diagnosis not present

## 2018-03-27 DIAGNOSIS — I5021 Acute systolic (congestive) heart failure: Secondary | ICD-10-CM | POA: Diagnosis present

## 2018-03-27 DIAGNOSIS — Z87891 Personal history of nicotine dependence: Secondary | ICD-10-CM

## 2018-03-27 DIAGNOSIS — E1151 Type 2 diabetes mellitus with diabetic peripheral angiopathy without gangrene: Secondary | ICD-10-CM | POA: Diagnosis present

## 2018-03-27 DIAGNOSIS — M1991 Primary osteoarthritis, unspecified site: Secondary | ICD-10-CM | POA: Diagnosis present

## 2018-03-27 DIAGNOSIS — R0602 Shortness of breath: Secondary | ICD-10-CM

## 2018-03-27 DIAGNOSIS — J189 Pneumonia, unspecified organism: Secondary | ICD-10-CM | POA: Diagnosis present

## 2018-03-27 LAB — CBC WITH DIFFERENTIAL/PLATELET
Basophils Absolute: 0 10*3/uL (ref 0.0–0.1)
Basophils Relative: 0 %
Eosinophils Absolute: 0.3 10*3/uL (ref 0.0–0.7)
Eosinophils Relative: 2 %
HCT: 29.2 % — ABNORMAL LOW (ref 39.0–52.0)
Hemoglobin: 9.8 g/dL — ABNORMAL LOW (ref 13.0–17.0)
Lymphocytes Relative: 13 %
Lymphs Abs: 1.8 10*3/uL (ref 0.7–4.0)
MCH: 27.9 pg (ref 26.0–34.0)
MCHC: 33.6 g/dL (ref 30.0–36.0)
MCV: 83.2 fL (ref 78.0–100.0)
Monocytes Absolute: 1.5 10*3/uL — ABNORMAL HIGH (ref 0.1–1.0)
Monocytes Relative: 11 %
Neutro Abs: 10 10*3/uL — ABNORMAL HIGH (ref 1.7–7.7)
Neutrophils Relative %: 74 %
Platelets: 329 10*3/uL (ref 150–400)
RBC: 3.51 MIL/uL — ABNORMAL LOW (ref 4.22–5.81)
RDW: 14 % (ref 11.5–15.5)
WBC: 13.7 10*3/uL — ABNORMAL HIGH (ref 4.0–10.5)

## 2018-03-27 LAB — COMPREHENSIVE METABOLIC PANEL
ALT: 11 U/L — ABNORMAL LOW (ref 17–63)
AST: 20 U/L (ref 15–41)
Albumin: 2.6 g/dL — ABNORMAL LOW (ref 3.5–5.0)
Alkaline Phosphatase: 135 U/L — ABNORMAL HIGH (ref 38–126)
Anion gap: 15 (ref 5–15)
BUN: 15 mg/dL (ref 6–20)
CO2: 22 mmol/L (ref 22–32)
Calcium: 8.9 mg/dL (ref 8.9–10.3)
Chloride: 100 mmol/L — ABNORMAL LOW (ref 101–111)
Creatinine, Ser: 1.39 mg/dL — ABNORMAL HIGH (ref 0.61–1.24)
GFR calc Af Amer: 56 mL/min — ABNORMAL LOW (ref >60)
GFR calc non Af Amer: 48 mL/min — ABNORMAL LOW (ref >60)
Glucose, Bld: 200 mg/dL — ABNORMAL HIGH (ref 65–99)
Potassium: 4.6 mmol/L (ref 3.5–5.1)
Sodium: 137 mmol/L (ref 135–145)
Total Bilirubin: 0.8 mg/dL (ref 0.3–1.2)
Total Protein: 7.8 g/dL (ref 6.5–8.1)

## 2018-03-27 LAB — I-STAT TROPONIN, ED: Troponin i, poc: 0.05 ng/mL (ref 0.00–0.08)

## 2018-03-27 LAB — TROPONIN I: Troponin I: 0.07 ng/mL (ref <0.03)

## 2018-03-27 LAB — GLUCOSE, CAPILLARY
Glucose-Capillary: 186 mg/dL — ABNORMAL HIGH (ref 65–99)
Glucose-Capillary: 169 mg/dL — ABNORMAL HIGH (ref 65–99)

## 2018-03-27 LAB — BRAIN NATRIURETIC PEPTIDE: B Natriuretic Peptide: 439 pg/mL — ABNORMAL HIGH (ref 0.0–100.0)

## 2018-03-27 MED ORDER — SODIUM CHLORIDE 0.9% FLUSH: 3.0000 mL | INTRAVENOUS | Status: DC | PRN

## 2018-03-27 MED ORDER — TIMOLOL MALEATE 0.5 % OP SOLN
1.0000 [drp] | Freq: Two times a day (BID) | OPHTHALMIC | Status: DC
  Administered 2018-03-27 – 2018-04-01 (×10): 1 [drp] via OPHTHALMIC
  Filled 2018-03-27: qty 5

## 2018-03-27 MED ORDER — ONDANSETRON HCL 4 MG/2ML IJ SOLN: 4.0000 mg | Freq: Four times a day (QID) | INTRAMUSCULAR | Status: DC | PRN

## 2018-03-27 MED ORDER — VANCOMYCIN HCL 10 G IV SOLR
2000.0000 mg | INTRAVENOUS | Status: AC
  Administered 2018-03-27: 2000 mg via INTRAVENOUS
  Filled 2018-03-27: qty 2000

## 2018-03-27 MED ORDER — PIPERACILLIN-TAZOBACTAM 3.375 G IVPB
3.3750 g | Freq: Three times a day (TID) | INTRAVENOUS | Status: DC
  Administered 2018-03-27 – 2018-04-01 (×14): 3.375 g via INTRAVENOUS
  Filled 2018-03-27 (×15): qty 50

## 2018-03-27 MED ORDER — HEPARIN SODIUM (PORCINE) 5000 UNIT/ML IJ SOLN
5000.0000 [IU] | Freq: Three times a day (TID) | INTRAMUSCULAR | Status: DC
  Administered 2018-03-27 – 2018-04-01 (×14): 5000 [IU] via SUBCUTANEOUS
  Filled 2018-03-27 (×13): qty 1

## 2018-03-27 MED ORDER — INSULIN ASPART 100 UNIT/ML ~~LOC~~ SOLN
0.0000 [IU] | Freq: Three times a day (TID) | SUBCUTANEOUS | Status: DC
  Administered 2018-03-27: 3 [IU] via SUBCUTANEOUS
  Administered 2018-03-28: 2 [IU] via SUBCUTANEOUS
  Administered 2018-03-28: 8 [IU] via SUBCUTANEOUS
  Administered 2018-03-29: 2 [IU] via SUBCUTANEOUS
  Administered 2018-03-29 – 2018-03-30 (×3): 3 [IU] via SUBCUTANEOUS
  Administered 2018-03-30 – 2018-03-31 (×2): 5 [IU] via SUBCUTANEOUS
  Administered 2018-03-31: 3 [IU] via SUBCUTANEOUS
  Administered 2018-03-31: 5 [IU] via SUBCUTANEOUS

## 2018-03-27 MED ORDER — ASPIRIN 81 MG PO CHEW
81.0000 mg | CHEWABLE_TABLET | Freq: Every day | ORAL | Status: DC
  Administered 2018-03-28 – 2018-04-01 (×5): 81 mg via ORAL
  Filled 2018-03-27 (×5): qty 1

## 2018-03-27 MED ORDER — AMLODIPINE BESYLATE 10 MG PO TABS
10.0000 mg | ORAL_TABLET | Freq: Every day | ORAL | Status: DC
  Administered 2018-03-28 – 2018-03-31 (×4): 10 mg via ORAL
  Filled 2018-03-27 (×4): qty 1

## 2018-03-27 MED ORDER — PIOGLITAZONE HCL 30 MG PO TABS
30.0000 mg | ORAL_TABLET | Freq: Every day | ORAL | Status: DC
  Filled 2018-03-27: qty 1

## 2018-03-27 MED ORDER — BRIMONIDINE TARTRATE 0.2 % OP SOLN
1.0000 [drp] | Freq: Two times a day (BID) | OPHTHALMIC | Status: DC
  Administered 2018-03-27 – 2018-04-01 (×10): 1 [drp] via OPHTHALMIC
  Filled 2018-03-27: qty 5

## 2018-03-27 MED ORDER — SODIUM CHLORIDE 0.9 % IV SOLN: 250.0000 mL | INTRAVENOUS | Status: DC | PRN

## 2018-03-27 MED ORDER — ACETAMINOPHEN 325 MG PO TABS: 650.0000 mg | ORAL_TABLET | ORAL | Status: DC | PRN

## 2018-03-27 MED ORDER — DIPHENHYDRAMINE HCL 25 MG PO CAPS
25.0000 mg | ORAL_CAPSULE | Freq: Every evening | ORAL | Status: DC | PRN
  Administered 2018-03-27: 25 mg via ORAL
  Filled 2018-03-27: qty 1

## 2018-03-27 MED ORDER — FUROSEMIDE 10 MG/ML IJ SOLN
60.0000 mg | Freq: Once | INTRAMUSCULAR | Status: AC
  Administered 2018-03-27: 60 mg via INTRAVENOUS
  Filled 2018-03-27: qty 8

## 2018-03-27 MED ORDER — GLIMEPIRIDE 4 MG PO TABS
4.0000 mg | ORAL_TABLET | Freq: Every day | ORAL | Status: DC
  Administered 2018-03-28 – 2018-04-01 (×5): 4 mg via ORAL
  Filled 2018-03-27 (×5): qty 1

## 2018-03-27 MED ORDER — PIPERACILLIN-TAZOBACTAM 3.375 G IVPB 30 MIN
3.3750 g | Freq: Once | INTRAVENOUS | Status: AC
  Administered 2018-03-27: 3.375 g via INTRAVENOUS
  Filled 2018-03-27: qty 50

## 2018-03-27 MED ORDER — FUROSEMIDE 10 MG/ML IJ SOLN
40.0000 mg | Freq: Two times a day (BID) | INTRAMUSCULAR | Status: DC
  Administered 2018-03-27 – 2018-04-01 (×10): 40 mg via INTRAVENOUS
  Filled 2018-03-27 (×10): qty 4

## 2018-03-27 MED ORDER — SIMVASTATIN 40 MG PO TABS: 40.0000 mg | ORAL_TABLET | Freq: Every day | ORAL | Status: DC

## 2018-03-27 MED ORDER — ATORVASTATIN CALCIUM 20 MG PO TABS
20.0000 mg | ORAL_TABLET | Freq: Every day | ORAL | Status: DC
  Administered 2018-03-29 – 2018-04-01 (×4): 20 mg via ORAL
  Filled 2018-03-27 (×5): qty 1

## 2018-03-27 MED ORDER — LOSARTAN POTASSIUM 50 MG PO TABS
100.0000 mg | ORAL_TABLET | Freq: Every day | ORAL | Status: DC
  Administered 2018-03-28 – 2018-04-01 (×5): 100 mg via ORAL
  Filled 2018-03-27 (×5): qty 2

## 2018-03-27 MED ORDER — VANCOMYCIN HCL IN DEXTROSE 1-5 GM/200ML-% IV SOLN
1000.0000 mg | INTRAVENOUS | Status: DC
  Administered 2018-03-28: 1000 mg via INTRAVENOUS
  Filled 2018-03-27: qty 200

## 2018-03-27 MED ORDER — VANCOMYCIN HCL IN DEXTROSE 1-5 GM/200ML-% IV SOLN: 1000.0000 mg | Freq: Once | INTRAVENOUS | Status: DC

## 2018-03-27 MED ORDER — SODIUM CHLORIDE 0.9% FLUSH
3.0000 mL | Freq: Two times a day (BID) | INTRAVENOUS | Status: DC
  Administered 2018-03-27 – 2018-04-01 (×5): 3 mL via INTRAVENOUS

## 2018-03-27 NOTE — ED Notes (Signed)
Bed: WG95 Expected date:  Expected time:  Means of arrival:  Comments: [facility]

## 2018-03-27 NOTE — Progress Notes (Signed)
CRITICAL VALUE ALERT  Critical Value:   Troponin 0.07  Date & Time Notied:   03/27/18 @ 1835  Provider Notified:  Algie Coffer, MD  Orders Received/Actions taken:  N/A- Continue to monitor. Pt denies CP at this time.

## 2018-03-27 NOTE — Progress Notes (Signed)
Pharmacy Antibiotic Note  Jeremiah [name] is a 72 y.o. male admitted on 03/27/2018 with pneumonia.  Pharmacy has been consulted for zosyn and vancomycin dosing.  Plan: Zosyn 3.375g IV q8h (4 hour infusion).  Vancomycin 2 Gm x1 then 1 gm IV q24h for AUC = 450 Goal AUC = 400-500 F/u cultures/levels Daily scr  Height:  (175.3 cm) Weight: 205 lb 0.4 oz (93 kg) IBW/kg (Calculated) : 70.7  Temp (24hrs), Avg:97.9 F (36.6 C), Min:97.4 F (36.3 C), Max:98.3 F (36.8 C)  Recent Labs  Lab 03/27/18 1239  WBC 13.7*  CREATININE 1.39*    Estimated Creatinine Clearance: 52.5 mL/min (A) (by C-G formula based on SCr of 1.39 mg/dL (H)).    No Known Allergies  Antimicrobials this admission: 5/4 zosyn >>  5/4 vancomycin >>   Dose adjustments this admission:   Microbiology results:  BCx:   UCx:    Sputum:    MRSA PCR:   Thank you for allowing pharmacy to be a part of this patient's care.  Lorenza Evangelist 03/27/2018 9:27 PM

## 2018-03-27 NOTE — Progress Notes (Signed)
A consult was received from an ED physician for Vancomycin and Zosyn per pharmacy dosing for sepsis.  The patient's profile has been reviewed for ht/wt/allergies/indication/available labs.   A one time order has been placed for Vancomycin 2gm IV and Zosyn 3.375gm.  Further antibiotics/pharmacy consults should be ordered by admitting physician if indicated.                       Thank you, Maryellen Pile, PharmD 03/27/2018  12:39 PM

## 2018-03-27 NOTE — H&P (Signed)
Referring Physician:  [name] is an 72 y.o. male.                       Chief Complaint: Shortness of breath and cough  HPI: 72 year old male with recent percutaneous cholecystostomy drain for acute cholecystitis has 4 day history of shortness of breath with exertion and with cough, nausea and bilateral leg edema. He denies chest pain or fever. He has PMH of type 2 DM, PVD and hypertension. Chest x-ray is suggestive of pneumonia v/s CHF and atelectasis.  Past Medical History:  Diagnosis Date  . Arthritis   . Diabetes mellitus without complication ([place])   . Hypertension   . Peripheral vascular disease [name])       Past Surgical History:  Procedure Laterality Date  . IR CHOLANGIOGRAM EXISTING TUBE  03/17/2018  . IR PERC CHOLECYSTOSTOMY  01/31/2018  . IR RADIOLOGIST EVAL & MGMT  03/02/2018    Family History  Problem Relation Age of Onset  . Diabetes Mother   . Hypertension Mother   . Heart disease Father   . Heart attack Father   . Diabetes Sister   . Hypertension Sister   . Diabetes Brother   . Heart disease Brother   . Hypertension Brother   . Heart attack Brother    Social History:  reports that he quit smoking about 34 years ago. He has never used smokeless tobacco. He reports that he does not drink alcohol or use drugs.  Allergies: No Known Allergies   (Not in a hospital admission)  Results for orders placed or performed during the hospital encounter of 03/27/18 (from the past 48 hour(s))  I-stat troponin, ED     Status: None   Collection Time: 03/27/18 12:38 PM  Result Value Ref Range   Troponin i, poc 0.05 0.00 - 0.08 ng/mL   Comment 3            Comment: Due to the release kinetics of cTnI, a negative result within the first hours of the onset of symptoms does not rule out myocardial infarction with certainty. If myocardial infarction is still suspected, repeat the test at appropriate intervals.   Comprehensive metabolic panel     Status: Abnormal   Collection Time: 03/27/18 12:39 PM  Result Value Ref Range   Sodium 137 135 - 145 mmol/L   Potassium 4.6 3.5 - 5.1 mmol/L   Chloride 100 (L) 101 - 111 mmol/L   CO2 22 22 - 32 mmol/L   Glucose, Bld 200 (H) 65 - 99 mg/dL   BUN 15 6 - 20 mg/dL   Creatinine, Ser 1.39 (H) 0.61 - 1.24 mg/dL   Calcium 8.9 8.9 - 10.3 mg/dL   Total Protein 7.8 6.5 - 8.1 g/dL   Albumin 2.6 (L) 3.5 - 5.0 g/dL   AST 20 15 - 41 U/L   ALT 11 (L) 17 - 63 U/L   Alkaline Phosphatase 135 (H) 38 - 126 U/L   Total Bilirubin 0.8 0.3 - 1.2 mg/dL   GFR calc non Af Amer 48 (L) >60 mL/min   GFR calc Af Amer 56 (L) >60 mL/min    Comment: (NOTE) The eGFR has been calculated using the CKD EPI equation. This calculation has not been validated in all clinical situations. eGFR's persistently <60 mL/min signify possible Chronic Kidney Disease.    Anion gap 15 5 - 15    Comment: Performed at [name], [place] [name], [place] [address]  CBC with Differential     Status: Abnormal   Collection Time: 03/27/18 12:39 PM  Result Value Ref Range   WBC 13.7 (H) 4.0 - 10.5 K/uL   RBC 3.51 (L) 4.22 - 5.81 MIL/uL   Hemoglobin 9.8 (L) 13.0 - 17.0 g/dL   HCT 29.2 (L) 39.0 - 52.0 %   MCV 83.2 78.0 - 100.0 fL   MCH 27.9 26.0 - 34.0 pg   MCHC 33.6 30.0 - 36.0 g/dL   RDW 14.0 11.5 - 15.5 %   Platelets 329 150 - 400 K/uL   Neutrophils Relative % 74 %   Neutro Abs 10.0 (H) 1.7 - 7.7 K/uL   Lymphocytes Relative 13 %   Lymphs Abs 1.8 0.7 - 4.0 K/uL   Monocytes Relative 11 %   Monocytes Absolute 1.5 (H) 0.1 - 1.0 K/uL   Eosinophils Relative 2 %   Eosinophils Absolute 0.3 0.0 - 0.7 K/uL   Basophils Relative 0 %   Basophils Absolute 0.0 0.0 - 0.1 K/uL    Comment: Performed at [facility], [place] [address], [place], [place] [address]  Brain natriuretic peptide     Status: Abnormal   Collection Time: 03/27/18 12:39 PM  Result Value Ref Range   B Natriuretic Peptide 439.0 (H) 0.0 - 100.0  pg/mL    Comment: Performed at [facility], [place] [address], [place], [place] [address]   Dg Chest 2 View  Result Date: 03/27/2018 CLINICAL DATA:  Dyspnea EXAM: CHEST - 2 VIEW COMPARISON:  01/09/2009 chest radiograph. FINDINGS: Slightly low lung volumes. Stable cardiomediastinal silhouette with normal heart size. No pneumothorax. Small bilateral pleural effusions. No overt pulmonary edema. Patchy bibasilar lung opacities, right greater than left. IMPRESSION: 1. Patchy bibasilar lung opacities, right greater than left, which could represent aspiration, pneumonia and/or atelectasis. Recommend follow-up chest radiographs to resolution. 2. Small bilateral pleural effusions. Electronically Signed   By: Jeremiah Sorrel M.D.   On: 03/27/2018 11:49    Review Of Systems Constitutional: No fever, chills, weight loss or gain. Eyes: No vision change, wears glasses. No discharge or pain. Ears: No hearing loss, No tinnitus. Respiratory: No asthma, COPD, pneumonias. Positive shortness of breath. No hemoptysis. Cardiovascular: No chest pain, palpitation, positive leg edema. Gastrointestinal: Positive nausea, vomiting, diarrhea, constipation. No GI bleed. No hepatitis. Genitourinary: No dysuria, hematuria, kidney stone. No incontinance. Neurological: No headache, stroke, seizures.  Psychiatry: No psych facility admission for anxiety, depression, suicide. No detox. Skin: No rash. Musculoskeletal: Positive joint pain, no fibromyalgia. No neck pain, back pain. Lymphadenopathy: No lymphadenopathy. Hematology: No anemia or easy bruising.   Blood pressure (!) 119/100, pulse 90, temperature (!) 97.4 F (36.3 C), temperature source Oral, resp. rate 20, SpO2 93 %. There is no height or weight on file to calculate BMI. General appearance: alert, cooperative, appears stated age and no distress Head: Normocephalic, atraumatic. Eyes: Brown eyes, pale pink conjunctiva, corneas clear.  Neck: No  adenopathy, no carotid bruit, + JVD, supple, symmetrical, trachea midline and thyroid not enlarged. Resp: Clear to auscultation bilaterally. Cardio: Regular rate and rhythm, S1, S2 normal, II/VI systolic murmur, no click, rub or gallop GI: Soft, mild RUQ-tender; bowel sounds normal; no organomegaly. Percutaneous drain  Extremities: 2 + edema, no cyanosis or clubbing. Skin: Warm and dry.  Neurologic: Alert and oriented X 3, normal strength. Normal coordination and gait.  Assessment/Plan Acute left heart systolic failure Type 2 DM Hypertension S/P percutaneous cholecystostomy drain  Admit.  IV lasix. IV antibiotics Echocardiogram for LVF.  Birdie Riddle, MD  03/27/2018, 3:51 PM

## 2018-03-27 NOTE — ED Triage Notes (Signed)
Per EMS, pt from home complains of shortness of breath for the past couple of days. Pt has drain for gallbladder, placed a couple of months ago. Pt states the drainage appears cloudier than normal recently. VSS stable, A&Ox4.   BP 122/78 HR 88 RR 18 CBG 244. Pt states he ate before having CBG taken

## 2018-03-27 NOTE — ED Notes (Signed)
ED TO INPATIENT HANDOFF REPORT  Name/Age/Gender Jeremiah [name] 72 y.o. male  Code Status    Code Status Orders  (From admission, onward)        Start     Ordered   03/27/18 1546  Full code  Continuous     03/27/18 1549    Code Status History    Date Active Date Inactive Code Status Order ID Comments User Context   01/30/2018 2045 02/02/2018 1646 Full Code [id number]  Vianne Bulls, MD ED      Home/SNF/Other Home  Chief Complaint shortness of breath  Level of Care/Admitting Diagnosis ED Disposition    ED Disposition Condition [facility]: [facility] [[id number]]  Level of Care: Telemetry [5]  Admit to tele based on following criteria: Acute CHF  Diagnosis: Acute systolic heart failure ([place]) [428.21.ICD-9-CM]  Admitting Physician: Dixie Dials [[id number]]  Attending Physician: Dixie Dials [[id number]]  Estimated length of stay: past midnight tomorrow  Certification:: I certify this patient will need inpatient services for at least 2 midnights  PT Class (Do Not Modify): Inpatient [101]  PT Acc Code (Do Not Modify): Private [1]       Medical History Past Medical History:  Diagnosis Date  . Arthritis   . Diabetes mellitus without complication ([place])   . Hypertension   . Peripheral vascular disease ([place])     Allergies No Known Allergies  IV Location/Drains/Wounds Patient Lines/Drains/Airways Status   Active Line/Drains/Airways    Name:   Placement date:   Placement time:   Site:   Days:   Peripheral IV 03/27/18 Right Forearm   03/27/18    1239    Forearm   less than 1   Biliary Tube Cook slip-coat 10.2 Fr. RUQ   01/31/18    1310    RUQ   55          Labs/Imaging Results for orders placed or performed during the hospital encounter of 03/27/18 (from the past 48 hour(s))  I-stat troponin, ED     Status: None   Collection Time: 03/27/18 12:38 PM  Result Value Ref Range   Troponin i, poc 0.05 0.00 - 0.08 ng/mL   Comment 3             Comment: Due to the release kinetics of cTnI, a negative result within the first hours of the onset of symptoms does not rule out myocardial infarction with certainty. If myocardial infarction is still suspected, repeat the test at appropriate intervals.   Comprehensive metabolic panel     Status: Abnormal   Collection Time: 03/27/18 12:39 PM  Result Value Ref Range   Sodium 137 135 - 145 mmol/L   Potassium 4.6 3.5 - 5.1 mmol/L   Chloride 100 (L) 101 - 111 mmol/L   CO2 22 22 - 32 mmol/L   Glucose, Bld 200 (H) 65 - 99 mg/dL   BUN 15 6 - 20 mg/dL   Creatinine, Ser 1.39 (H) 0.61 - 1.24 mg/dL   Calcium 8.9 8.9 - 10.3 mg/dL   Total Protein 7.8 6.5 - 8.1 g/dL   Albumin 2.6 (L) 3.5 - 5.0 g/dL   AST 20 15 - 41 U/L   ALT 11 (L) 17 - 63 U/L   Alkaline Phosphatase 135 (H) 38 - 126 U/L   Total Bilirubin 0.8 0.3 - 1.2 mg/dL   GFR calc non Af Amer 48 (L) >60 mL/min   GFR calc Af Amer 56 (  L) >60 mL/min    Comment: (NOTE) The eGFR has been calculated using the CKD EPI equation. This calculation has not been validated in all clinical situations. eGFR's persistently <60 mL/min signify possible Chronic Kidney Disease.    Anion gap 15 5 - 15    Comment: Performed at [facility], [place] [address] [place], [place] [address]  CBC with Differential     Status: Abnormal   Collection Time: 03/27/18 12:39 PM  Result Value Ref Range   WBC 13.7 (H) 4.0 - 10.5 K/uL   RBC 3.51 (L) 4.22 - 5.81 MIL/uL   Hemoglobin 9.8 (L) 13.0 - 17.0 g/dL   HCT 29.2 (L) 39.0 - 52.0 %   MCV 83.2 78.0 - 100.0 fL   MCH 27.9 26.0 - 34.0 pg   MCHC 33.6 30.0 - 36.0 g/dL   RDW 14.0 11.5 - 15.5 %   Platelets 329 150 - 400 K/uL   Neutrophils Relative % 74 %   Neutro Abs 10.0 (H) 1.7 - 7.7 K/uL   Lymphocytes Relative 13 %   Lymphs Abs 1.8 0.7 - 4.0 K/uL   Monocytes Relative 11 %   Monocytes Absolute 1.5 (H) 0.1 - 1.0 K/uL   Eosinophils Relative 2 %   Eosinophils Absolute 0.3 0.0 - 0.7 K/uL    Basophils Relative 0 %   Basophils Absolute 0.0 0.0 - 0.1 K/uL    Comment: Performed at [facility], [place] [address], [place], [place] [address]  Brain natriuretic peptide     Status: Abnormal   Collection Time: 03/27/18 12:39 PM  Result Value Ref Range   B Natriuretic Peptide 439.0 (H) 0.0 - 100.0 pg/mL    Comment: Performed at [facility], [place] [address], [place],  [address]   Dg Chest 2 View  Result Date: 03/27/2018 CLINICAL DATA:  Dyspnea EXAM: CHEST - 2 VIEW COMPARISON:  01/09/2009 chest radiograph. FINDINGS: Slightly low lung volumes. Stable cardiomediastinal silhouette with normal heart size. No pneumothorax. Small bilateral pleural effusions. No overt pulmonary edema. Patchy bibasilar lung opacities, right greater than left. IMPRESSION: 1. Patchy bibasilar lung opacities, right greater than left, which could represent aspiration, pneumonia and/or atelectasis. Recommend follow-up chest radiographs to resolution. 2. Small bilateral pleural effusions. Electronically Signed   By: Ilona Sorrel M.D.   On: 03/27/2018 11:49    Pending Labs Unresulted Labs (From admission, onward)   Start     Ordered   03/28/18 0017  Basic metabolic panel  Daily,   R     03/27/18 1549   03/27/18 1546  CBC  (heparin)  Once,   R    Comments:  Baseline for heparin therapy IF NOT ALREADY DRAWN.  Notify MD if PLT < 100 K.    03/27/18 1549   03/27/18 1546  Creatinine, serum  (heparin)  Once,   R    Comments:  Baseline for heparin therapy IF NOT ALREADY DRAWN.    03/27/18 1549   03/27/18 1546  Troponin I  Now then every 6 hours,   R     03/27/18 1549   03/27/18 1209  Culture, blood (routine x 2)  BLOOD CULTURE X 2,   STAT     03/27/18 1210      Vitals/Pain Today's Vitals   03/27/18 1125 03/27/18 1126 03/27/18 1127 03/27/18 1402  BP: (!) 147/73   (!) 119/100  Pulse: 80   90  Resp: 19   20  Temp: (!) 97.4 F (  36.3 C)     TempSrc: Oral     SpO2: (!) 87%   93% 93%  PainSc:  0-No pain      Isolation Precautions No active isolations  Medications Medications  heparin injection 5,000 Units (has no administration in time range)  sodium chloride flush (NS) 0.9 % injection 3 mL (has no administration in time range)  sodium chloride flush (NS) 0.9 % injection 3 mL (has no administration in time range)  0.9 %  sodium chloride infusion (has no administration in time range)  furosemide (LASIX) injection 40 mg (has no administration in time range)  acetaminophen (TYLENOL) tablet 650 mg (has no administration in time range)  ondansetron (ZOFRAN) injection 4 mg (has no administration in time range)  amLODipine (NORVASC) tablet 10 mg (has no administration in time range)  aspirin chewable tablet 81 mg (has no administration in time range)  brimonidine (ALPHAGAN) 0.2 % ophthalmic solution 1 drop (has no administration in time range)  glimepiride (AMARYL) tablet 4 mg (has no administration in time range)  losartan (COZAAR) tablet 100 mg (has no administration in time range)  pioglitazone (ACTOS) tablet 30 mg (has no administration in time range)  simvastatin (ZOCOR) tablet 40 mg (has no administration in time range)  timolol (TIMOPTIC) 0.5 % ophthalmic solution 1 drop (has no administration in time range)  insulin aspart (novoLOG) injection 0-15 Units (has no administration in time range)  piperacillin-tazobactam (ZOSYN) IVPB 3.375 g (0 g Intravenous Stopped 03/27/18 1326)  vancomycin (VANCOCIN) 2,000 mg in sodium chloride 0.9 % 500 mL IVPB (0 mg Intravenous Stopped 03/27/18 1500)  furosemide (LASIX) injection 60 mg (60 mg Intravenous Given 03/27/18 1506)    Mobility walks

## 2018-03-27 NOTE — Progress Notes (Signed)
Patient stated that he wanted a sleep aid. Dr. Roseanne Kaufman office was notified. MD returned the call promptly and an order for placed.

## 2018-03-27 NOTE — ED Provider Notes (Addendum)
[facility]-EMERGENCY DEPT Provider Note   CSN: [id number] Arrival date & time: 03/27/18  1100     History   Chief Complaint Chief Complaint  Patient presents with  . Shortness of Breath    HPI Jeremiah [name] is a 72 y.o. male. With diabetes mellitus, peripheral vascular disease, essential hypertension, acute cholecystitis with percutaneous cholecystectomy drain in place, and arthritis who presented with shortness of breath. The patient stated that his shortness of breath started on Wednesday when he was walking. He found himself short of breath even after walking a few steps. He has accompanied dry cough, nausea, decreased appetite, and bilateral lower extremity swelling. He denies sneezing, post nasal drip, fever.   The patient does not have any prior echos on file. He states that he uses numerous pillows to sleep at night. Denies any chest pain. Patient has an increased oxygen requirement and is currently on 3L [place].   Past Medical History:  Diagnosis Date  . Arthritis   . Diabetes mellitus without complication ([facility])   . Hypertension   . Peripheral vascular disease [facility])     Patient Active Problem List   Diagnosis Date Noted  . Diabetes mellitus without complication ([facility]) 01/30/2018  . Hypertension 01/30/2018  . Acute cholecystitis 01/30/2018  . AKI (acute kidney injury) ([facility]) 01/30/2018  . Normocytic anemia 01/30/2018  . PVD (peripheral vascular disease) ([facility]) 09/05/2014    Past Surgical History:  Procedure Laterality Date  . IR CHOLANGIOGRAM EXISTING TUBE  03/17/2018  . IR PERC CHOLECYSTOSTOMY  01/31/2018  . IR RADIOLOGIST EVAL & MGMT  03/02/2018      Home Medications    Prior to Admission medications   Medication Sig Start Date End Date Taking? Authorizing Provider  acetaminophen (TYLENOL) 325 MG tablet Take 2 tablets (650 mg total) by mouth every 6 (six) hours as needed for mild pain (or Fever >/= 101). 02/02/18   Esperanza Sheets, MD    amLODipine (NORVASC) 10 MG tablet Take 1 tablet (10 mg total) by mouth daily. 02/02/18   Esperanza Sheets, MD  aspirin 81 MG tablet Take 81 mg by mouth daily.    [provider]  brimonidine (ALPHAGAN) 0.2 % ophthalmic solution Place 1 drop into both eyes 2 (two) times daily.     [provider]  glimepiride (AMARYL) 4 MG tablet Take 1 tablet (4 mg total) by mouth daily with breakfast. 02/02/18   Esperanza Sheets, MD  Multiple Vitamin (MULTIVITAMIN) tablet Take 1 tablet by mouth daily.    [provider]  pioglitazone (ACTOS) 30 MG tablet Take 30 mg by mouth daily. 01/07/18   [provider]  simvastatin (ZOCOR) 40 MG tablet Take 40 mg by mouth daily.    [provider]  timolol (TIMOPTIC) 0.5 % ophthalmic solution Place 1 drop into both eyes 2 (two) times daily. 01/27/18   [provider]    Family History Family History  Problem Relation Age of Onset  . Diabetes Mother   . Hypertension Mother   . Heart disease Father   . Heart attack Father   . Diabetes Sister   . Hypertension Sister   . Diabetes Brother   . Heart disease Brother   . Hypertension Brother   . Heart attack Brother     Social History Social History   Tobacco Use  . Smoking status: Former Smoker    Last attempt to quit: 11/25/1983    Years since quitting: 34.3  .  Smokeless tobacco: Never Used  Substance Use Topics  . Alcohol use: No  . Drug use: No     Allergies   Patient has no known allergies.   Review of Systems Review of Systems  Constitutional: Positive for appetite change, diaphoresis and fatigue. Negative for fever.  HENT: Negative for postnasal drip and sneezing.   Respiratory: Positive for cough and shortness of breath. Negative for chest tightness.   Cardiovascular: Positive for leg swelling. Negative for chest pain.   Physical Exam Updated Vital Signs BP (!) 147/73 (BP Location: Left Arm)   Pulse 80   Temp (!) 97.4 F (36.3 C) (Oral)    Resp 19   SpO2 93%   Physical Exam  Constitutional: He is oriented to person, place, and time. He appears well-developed and well-nourished.  Non-toxic appearance. He does not appear ill.  HENT:  Head: Normocephalic and atraumatic.  Cardiovascular: Normal rate, regular rhythm, normal heart sounds and intact distal pulses.  Pulmonary/Chest: Effort normal. No respiratory distress.  Currently on 3L via [place], dullness to percussion in bilateral lower chest  Abdominal: Soft. He exhibits no distension. There is no tenderness.  Cholecystectomy drain in place with light brown colored fluid coming out of drain  Musculoskeletal:       Right lower leg: He exhibits edema (2+ pitting).  Neurological: He is alert and oriented to person, place, and time.  Psychiatric: His behavior is normal. His mood appears not anxious. He is not agitated.    ED Treatments / Results  Labs (all labs ordered are listed, but only abnormal results are displayed) Labs Reviewed - No data to display  EKG EKG Interpretation  Date/Time:  Saturday Mar 27 2018 11:23:26 EDT Ventricular Rate:  83 PR Interval:    QRS Duration: 84 QT Interval:  397 QTC Calculation: 467 R Axis:   68 Text Interpretation:  Sinus rhythm Probable left atrial enlargement No STEMI.  Confirmed by Alona Bene [phone]) on 03/27/2018 11:34:54 AM   Radiology No results found.  Procedures Procedures (including critical care time)  Medications Ordered in ED Medications - No data to display   Initial Impression / Assessment and Plan / ED Course  I have reviewed the triage vital signs and the nursing notes.  Pertinent labs & imaging results that were available during my care of the patient were reviewed by me and considered in my medical decision making (see chart for details).  Dyspnea: The patient presents with shortness of breath since Wednesday Mar 24, 2018.  The patient is not febrile, saturating at 93%, respirating=19. Mild leukocytosis of  13.7. EKG showed normal sinus rhythm without any signs of st or t wave changes. Patient appeared fluid overloaded on exam (lower extremity piting edema and dullness to percuss in bilateral lower chest). BNP 439. Chest xray shows bilateral pleural effusion (R>L) and patchy bibasilar opacities that is suspicious for aspiration and pneumonia. Started on empiric vancomycin and zosyn. Troponin 0.05.   No prior echos on file. Follows with Dr. Sharyn Lull oupatient, Called Dr. Algie Coffer for admission.  -Gave IV Lasix  once after checking that patient's renal function was close to baseline  -Blood cultures pending    Final Clinical Impressions(s) / ED Diagnoses   Final diagnoses:  None    ED Discharge Orders    None       Lorenso Courier, MD 03/27/18 1450    Lorenso Courier, MD 03/27/18 1456    Maia Plan, MD 03/27/18 1551    Alona Bene  G, MD 04/02/18 1016

## 2018-03-28 ENCOUNTER — Inpatient Hospital Stay (HOSPITAL_COMMUNITY): Payer: Medicare Other

## 2018-03-28 LAB — GLUCOSE, CAPILLARY
Glucose-Capillary: 123 mg/dL — ABNORMAL HIGH (ref 65–99)
Glucose-Capillary: 282 mg/dL — ABNORMAL HIGH (ref 65–99)
Glucose-Capillary: 92 mg/dL (ref 65–99)
Glucose-Capillary: 199 mg/dL — ABNORMAL HIGH (ref 65–99)

## 2018-03-28 LAB — BASIC METABOLIC PANEL
Anion gap: 12 (ref 5–15)
BUN: 15 mg/dL (ref 6–20)
CO2: 25 mmol/L (ref 22–32)
Calcium: 8.7 mg/dL — ABNORMAL LOW (ref 8.9–10.3)
Chloride: 100 mmol/L — ABNORMAL LOW (ref 101–111)
Creatinine, Ser: 1.53 mg/dL — ABNORMAL HIGH (ref 0.61–1.24)
GFR calc Af Amer: 50 mL/min — ABNORMAL LOW (ref >60)
GFR calc non Af Amer: 43 mL/min — ABNORMAL LOW (ref >60)
Glucose, Bld: 128 mg/dL — ABNORMAL HIGH (ref 65–99)
Potassium: 3.3 mmol/L — ABNORMAL LOW (ref 3.5–5.1)
Sodium: 137 mmol/L (ref 135–145)

## 2018-03-28 LAB — TROPONIN I
Troponin I: 0.08 ng/mL (ref <0.03)
Troponin I: 0.08 ng/mL (ref <0.03)

## 2018-03-28 LAB — ECHOCARDIOGRAM COMPLETE
Height: 69 in
Weight: 3248 oz

## 2018-03-28 MED ORDER — POTASSIUM CHLORIDE CRYS ER 10 MEQ PO TBCR
10.0000 meq | EXTENDED_RELEASE_TABLET | Freq: Three times a day (TID) | ORAL | Status: AC
  Administered 2018-03-28 (×3): 10 meq via ORAL
  Filled 2018-03-28 (×4): qty 1

## 2018-03-28 NOTE — Progress Notes (Signed)
Ref: Jeremiah Guiles, MD   Subjective:  Breathing is improving. Echocardiogram with preserved LV systolic function and mild AS.  Objective:  Vital Signs in the last 24 hours: Temp:  [98.3 F (36.8 C)-99 F (37.2 C)] 98.3 F (36.8 C) (05/05 1247) Pulse Rate:  [70-90] 70 (05/05 1247) Cardiac Rhythm: Normal sinus rhythm (05/05 0715) Resp:  [16-24] 16 (05/05 1247) BP: (117-152)/(49-100) 117/49 (05/05 1247) SpO2:  [93 %-97 %] 97 % (05/05 1247) Weight:  [92.1 kg (203 lb)-93 kg (205 lb 0.4 oz)] 92.1 kg (203 lb) (05/05 0442)  Physical Exam: BP Readings from Last 1 Encounters:  03/28/18 (!) 117/49     Wt Readings from Last 1 Encounters:  03/28/18 92.1 kg (203 lb)    Weight change:  Body mass index is 29.98 kg/m. HEENT: [place]/AT, Eyes-Brown, PERL, EOMI, Conjunctiva-Pale pink, Sclera-Non-icteric Neck: + JVD, No bruit, Trachea midline. Lungs:  Clearing, Bilateral. Cardiac:  Regular rhythm, normal S1 and S2, no S3. II/VI systolic murmur. Abdomen:  Soft, non-tender. BS present. Percutaneous drain present. Extremities:  1 + edema present. No cyanosis. No clubbing. CNS: AxOx3, Cranial nerves grossly intact, moves all 4 extremities.  Skin: Warm and dry.   Intake/Output from previous day: 05/04 0701 - 05/05 0700 In: 405 [P.O.:300; IV Piggyback:100] Out: 1415 [Urine:1400; Drains:15]    Lab Results: BMET    Component Value Date/Time   NA 137 03/28/2018 0436   NA 137 03/27/2018 1239   NA 138 02/02/2018 0535   K 3.3 (L) 03/28/2018 0436   K 4.6 03/27/2018 1239   K 3.8 02/02/2018 0535   CL 100 (L) 03/28/2018 0436   CL 100 (L) 03/27/2018 1239   CL 108 02/02/2018 0535   CO2 25 03/28/2018 0436   CO2 22 03/27/2018 1239   CO2 19 (L) 02/02/2018 0535   GLUCOSE 128 (H) 03/28/2018 0436   GLUCOSE 200 (H) 03/27/2018 1239   GLUCOSE 119 (H) 02/02/2018 0535   BUN 15 03/28/2018 0436   BUN 15 03/27/2018 1239   BUN 23 (H) 02/02/2018 0535   CREATININE 1.53 (H) 03/28/2018 0436   CREATININE 1.39  (H) 03/27/2018 1239   CREATININE 1.61 (H) 02/02/2018 0535   CALCIUM 8.7 (L) 03/28/2018 0436   CALCIUM 8.9 03/27/2018 1239   CALCIUM 8.1 (L) 02/02/2018 0535   GFRNONAA 43 (L) 03/28/2018 0436   GFRNONAA 48 (L) 03/27/2018 1239   GFRNONAA 41 (L) 02/02/2018 0535   GFRAA 50 (L) 03/28/2018 0436   GFRAA 56 (L) 03/27/2018 1239   GFRAA 47 (L) 02/02/2018 0535   CBC    Component Value Date/Time   WBC 13.7 (H) 03/27/2018 1239   RBC 3.51 (L) 03/27/2018 1239   HGB 9.8 (L) 03/27/2018 1239   HCT 29.2 (L) 03/27/2018 1239   PLT 329 03/27/2018 1239   MCV 83.2 03/27/2018 1239   MCH 27.9 03/27/2018 1239   MCHC 33.6 03/27/2018 1239   RDW 14.0 03/27/2018 1239   LYMPHSABS 1.8 03/27/2018 1239   MONOABS 1.5 (H) 03/27/2018 1239   EOSABS 0.3 03/27/2018 1239   BASOSABS 0.0 03/27/2018 1239   HEPATIC Function Panel Recent Labs    01/31/18 0549 02/01/18 0529 03/27/18 1239  PROT 6.9 6.9 7.8   HEMOGLOBIN A1C No components found for: HGA1C,  MPG CARDIAC ENZYMES Lab Results  Component Value Date   TROPONINI 0.08 (HH) 03/28/2018   TROPONINI 0.08 (HH) 03/27/2018   TROPONINI 0.07 (HH) 03/27/2018   BNP No results for input(s): PROBNP in the last 8760 hours. TSH No  results for input(s): TSH in the last 8760 hours. CHOLESTEROL No results for input(s): CHOL in the last 8760 hours.  Scheduled Meds: . amLODipine  10 mg Oral Daily  . aspirin  81 mg Oral Daily  . atorvastatin  20 mg Oral Daily  . brimonidine  1 drop Both Eyes BID  . furosemide  40 mg Intravenous Q12H  . glimepiride  4 mg Oral Q breakfast  . heparin  5,000 Units Subcutaneous Q8H  . insulin aspart  0-15 Units Subcutaneous TID WC  . losartan  100 mg Oral Daily  . potassium chloride  10 mEq Oral TID  . sodium chloride flush  3 mL Intravenous Q12H  . timolol  1 drop Both Eyes BID   Continuous Infusions: . sodium chloride    . piperacillin-tazobactam (ZOSYN)  IV Stopped (03/28/18 1610)  . vancomycin 1,000 mg (03/28/18 1218)   PRN  Meds:.sodium chloride, acetaminophen, diphenhydrAMINE, ondansetron (ZOFRAN) IV, sodium chloride flush  Assessment/Plan: Acute left heart diastolic failure Type 2 DM Hypertension S/P acute cholecystitis S/P percutaneous cholecystostomy drain  Continue IV lasix and IV antibiotics. If cultures are favorable discontinue vancomycin. Patient prefers Insulin for blood sugar control   LOS: 1 day    Jeremiah Cobb  MD  03/28/2018, 12:50 PM

## 2018-03-28 NOTE — Progress Notes (Signed)
  Echocardiogram 2D Echocardiogram has been performed.  Jeremiah [name] G Jeremiah [name] 03/28/2018, 10:03 AM

## 2018-03-29 LAB — BASIC METABOLIC PANEL
Anion gap: 14 (ref 5–15)
BUN: 16 mg/dL (ref 6–20)
CO2: 25 mmol/L (ref 22–32)
Calcium: 8.6 mg/dL — ABNORMAL LOW (ref 8.9–10.3)
Chloride: 101 mmol/L (ref 101–111)
Creatinine, Ser: 1.74 mg/dL — ABNORMAL HIGH (ref 0.61–1.24)
GFR calc Af Amer: 43 mL/min — ABNORMAL LOW (ref >60)
GFR calc non Af Amer: 37 mL/min — ABNORMAL LOW (ref >60)
Glucose, Bld: 127 mg/dL — ABNORMAL HIGH (ref 65–99)
Potassium: 3.5 mmol/L (ref 3.5–5.1)
Sodium: 140 mmol/L (ref 135–145)

## 2018-03-29 LAB — GLUCOSE, CAPILLARY
Glucose-Capillary: 153 mg/dL — ABNORMAL HIGH (ref 65–99)
Glucose-Capillary: 197 mg/dL — ABNORMAL HIGH (ref 65–99)
Glucose-Capillary: 168 mg/dL — ABNORMAL HIGH (ref 65–99)
Glucose-Capillary: 159 mg/dL — ABNORMAL HIGH (ref 65–99)

## 2018-03-29 MED ORDER — POTASSIUM CHLORIDE CRYS ER 20 MEQ PO TBCR
20.0000 meq | EXTENDED_RELEASE_TABLET | Freq: Two times a day (BID) | ORAL | Status: DC
  Administered 2018-03-29 – 2018-04-01 (×6): 20 meq via ORAL
  Filled 2018-03-29 (×6): qty 1

## 2018-03-29 MED ORDER — VANCOMYCIN HCL 10 G IV SOLR
1750.0000 mg | INTRAVENOUS | Status: DC
  Filled 2018-03-29 (×2): qty 1750

## 2018-03-29 NOTE — Progress Notes (Signed)
Physical Therapy Evaluation Patient Details Name: Jeremiah [name] MRN: [id number] DOB: [birth year] Today's Date: 03/29/2018   History of Present Illness  72 yo male admitted with CHF.   Clinical Impression  On eval, pt required Mod assist to stand and Min assist for ambulation. He walked ~50 feet with a RW. Pt presents with general weakness, decreased activity tolerance, and impaired gait and balance. Discussed d/c plan-pt stated he would return home. He is agreeable to HHPT f/u. Will follow and progress activity as tolerated.     Follow Up Recommendations Home health PT    Equipment Recommendations  None recommended by PT    Recommendations for Other Services       Precautions / Restrictions Precautions Precautions: Fall Restrictions Weight Bearing Restrictions: No      Mobility  Bed Mobility               General bed mobility comments: oob in recliner  Transfers Overall transfer level: Needs assistance Equipment used: Rolling walker (2 wheeled) Transfers: Sit to/from Stand Sit to Stand: Mod assist         General transfer comment: Assist to rise, stabilize, control descent.   Ambulation/Gait Ambulation/Gait assistance: Min assist Ambulation Distance (Feet): 50 Feet Assistive device: Rolling walker (2 wheeled) Gait Pattern/deviations: Step-through pattern;Decreased stride length     General Gait Details: Assist to steady intermittently. Several brief standing rest breaks needed/taken. Dyspne 2/4. Pt c/o back pain which limited distance as well.   Stairs            Wheelchair Mobility    Modified Rankin (Stroke Patients Only)       Balance Overall balance assessment: Needs assistance         Standing balance support: Bilateral upper extremity supported Standing balance-Leahy Scale: Poor                               Pertinent Vitals/Pain Pain Assessment: Faces Faces Pain Scale: Hurts whole lot Pain Location: chronic back  pain Pain Descriptors / Indicators: Discomfort;Aching Pain Intervention(s): Limited activity within patient's tolerance;Repositioned;Monitored during session    Home Living Family/patient expects to be discharged to:: Private residence Living Arrangements: Spouse/significant other Available Help at Discharge: Family Type of Home: House Home Access: Stairs to enter Entrance Stairs-Rails: Doctor, general practice of Steps: 4 Home Layout: One level Home Equipment: Environmental consultant - 2 wheels      Prior Function Level of Independence: Independent with assistive device(s)         Comments: uses RW for ambulation     Hand Dominance        Extremity/Trunk Assessment   Upper Extremity Assessment Upper Extremity Assessment: Generalized weakness    Lower Extremity Assessment Lower Extremity Assessment: Generalized weakness    Cervical / Trunk Assessment Cervical / Trunk Assessment: Kyphotic  Communication   Communication: No difficulties  Cognition Arousal/Alertness: Awake/alert Behavior During Therapy: WFL for tasks assessed/performed Overall Cognitive Status: Within Functional Limits for tasks assessed                                        General Comments      Exercises     Assessment/Plan    PT Assessment    PT Problem List         PT Treatment Interventions      PT  Goals (Current goals can be found in the Care Plan section)       Frequency Min 3X/week   Barriers to discharge        Co-evaluation               AM-PAC PT "6 Clicks" Daily Activity  Outcome Measure Difficulty turning over in bed (including adjusting bedclothes, sheets and blankets)?: A Lot Difficulty moving from lying on back to sitting on the side of the bed? : A Lot Difficulty sitting down on and standing up from a chair with arms (e.g., wheelchair, bedside commode, etc,.)?: Unable Help needed moving to and from a bed to chair (including a wheelchair)?: A  Lot Help needed walking in hospital room?: A Lot Help needed climbing 3-5 steps with a railing? : A Lot 6 Click Score: 11    End of Session Equipment Utilized During Treatment: Gait belt Activity Tolerance: Patient limited by fatigue Patient left: in chair;with call bell/phone within reach   PT Visit Diagnosis: Muscle weakness (generalized) (M62.81);Difficulty in walking, not elsewhere classified (R26.2)    Time: 1610-9604 PT Time Calculation (min) (ACUTE ONLY): 21 min   Charges:   PT Evaluation $PT Eval Moderate Complexity: 1 Mod     PT G Codes:          Rebeca Alert, MPT Pager: [phone]

## 2018-03-29 NOTE — Progress Notes (Signed)
Spoke with pt concerning HH will follow after PT eval.

## 2018-03-29 NOTE — Progress Notes (Signed)
Pharmacy Antibiotic Note  Jeremiah [name] is a 72 y.o. male admitted on 03/27/2018 with ? PNA and possible IAI  Pharmacy had been consulted for zosyn and vancomycin dosing on 5/4  Plan: 1) Continued rise in SCr - change vanc from 1g q24 to  q48 - goal AUC 400-500 2) Continue current Zosyn dosing 3) F/u cultures/levels 4) Daily Scr 5) Note, that blood cultures to date are negative, also Zosyn alone would sufficiently treat an intra-abdominal infection and vancomycin could be discontinued as MRSA is not likely  Height:  (175.3 cm) Weight: 202 lb 2.6 oz (91.7 kg) IBW/kg (Calculated) : 70.7  Temp (24hrs), Avg:98.2 F (36.8 C), Min:98.1 F (36.7 C), Max:98.3 F (36.8 C)  Recent Labs  Lab 03/27/18 1239 03/28/18 0436 03/29/18 0459  WBC 13.7*  --   --   CREATININE 1.39* 1.53* 1.74*    Estimated Creatinine Clearance: 41.7 mL/min (A) (by C-G formula based on SCr of 1.74 mg/dL (H)).    No Known Allergies  Antimicrobials this admission: 5/4 zosyn >>  5/4 vancomycin >>   Thank you for allowing pharmacy to be a part of this patient's care.  Hessie Knows, PharmD, BCPS Pager [phone] 03/29/2018 10:25 AM

## 2018-03-29 NOTE — Progress Notes (Signed)
Subjective:  Patient denies any chest pain. States his breathing and leg swelling has improved denies any fever or chills. Percutaneous cholecystectomy drain noted patient eager for laparoscopic cholecystectomy and removal of the drain  Objective:  Vital Signs in the last 24 hours: Temp:  [98.1 F (36.7 C)] 98.1 F (36.7 C) (05/06 0529) Pulse Rate:  [76-85] 76 (05/06 0529) Resp:  [18] 18 (05/06 0529) BP: (116-125)/(53-60) 125/60 (05/06 0529) SpO2:  [94 %-95 %] 95 % (05/06 0529) Weight:  [91.7 kg (202 lb 2.6 oz)] 91.7 kg (202 lb 2.6 oz) (05/06 0529)  Intake/Output from previous day: 05/05 0701 - 05/06 0700 In: 1070 [P.O.:720; IV Piggyback:350] Out: 1250 [Urine:1250] Intake/Output from this shift: Total I/O In: 180 [P.O.:120; Other:10; IV Piggyback:50] Out: 15 [Drains:15]  Physical Exam: Neck: no adenopathy, no carotid bruit, no JVD and supple, symmetrical, trachea midline Lungs: decreased breath sound at bases Heart: regular rate and rhythm, S1, S2 normal and 2/6 systolic murmur noted Abdomen: soft, non-tender; bowel sounds normal; no masses,  no organomegaly Extremities: no clubbing cyanosis 1+ edema noted  Lab Results: Recent Labs    03/27/18 1239  WBC 13.7*  HGB 9.8*  PLT 329   Recent Labs    03/28/18 0436 03/29/18 0459  NA 137 140  K 3.3* 3.5  CL 100* 101  CO2 25 25  GLUCOSE 128* 127*  BUN 15 16  CREATININE 1.53* 1.74*   Recent Labs    03/27/18 2310 03/28/18 0436  TROPONINI 0.08* 0.08*   Hepatic Function Panel Recent Labs    03/27/18 1239  PROT 7.8  ALBUMIN 2.6*  AST 20  ALT 11*  ALKPHOS 135*  BILITOT 0.8   No results for input(s): CHOL in the last 72 hours. No results for input(s): PROTIME in the last 72 hours.  Imaging: Imaging results have been reviewed and No results found.  Cardiac Studies:  Assessment/Plan:  Resolving acute diastolic congestive heart failure Hypertension Diabetes mellitus Chronic kidney disease stage  III Hyperlipidemia Peripheral vascular disease Status post recent acute cholecystitis status post percutaneous cholecystostomy drain Hypokalemia Plan DC vancomycin Continue Zosyn for now Replace K Check labs in a.m. Will get surgical consult  LOS: 2 days    Rinaldo Cloud 03/29/2018, 6:52 PM

## 2018-03-29 NOTE — Plan of Care (Signed)
  Problem: Education: Goal: Knowledge of General Education information will improve Outcome: Progressing   Problem: Health Behavior/Discharge Planning: Goal: Ability to manage health-related needs will improve Outcome: Progressing   Problem: Clinical Measurements: Goal: Will remain free from infection Outcome: Progressing Goal: Diagnostic test results will improve Outcome: Progressing Goal: Cardiovascular complication will be avoided Outcome: Progressing   Problem: Education: Goal: Ability to demonstrate management of disease process will improve Outcome: Progressing Goal: Ability to verbalize understanding of medication therapies will improve Outcome: Progressing   Problem: Activity: Goal: Capacity to carry out activities will improve Outcome: Progressing   Problem: Cardiac: Goal: Ability to achieve and maintain adequate cardiopulmonary perfusion will improve Outcome: Progressing

## 2018-03-30 LAB — GLUCOSE, CAPILLARY
Glucose-Capillary: 104 mg/dL — ABNORMAL HIGH (ref 65–99)
Glucose-Capillary: 218 mg/dL — ABNORMAL HIGH (ref 65–99)
Glucose-Capillary: 152 mg/dL — ABNORMAL HIGH (ref 65–99)
Glucose-Capillary: 240 mg/dL — ABNORMAL HIGH (ref 65–99)

## 2018-03-30 LAB — BASIC METABOLIC PANEL
Anion gap: 12 (ref 5–15)
BUN: 14 mg/dL (ref 6–20)
CO2: 28 mmol/L (ref 22–32)
Calcium: 8.7 mg/dL — ABNORMAL LOW (ref 8.9–10.3)
Chloride: 99 mmol/L — ABNORMAL LOW (ref 101–111)
Creatinine, Ser: 1.61 mg/dL — ABNORMAL HIGH (ref 0.61–1.24)
GFR calc Af Amer: 47 mL/min — ABNORMAL LOW (ref >60)
GFR calc non Af Amer: 40 mL/min — ABNORMAL LOW (ref >60)
Glucose, Bld: 106 mg/dL — ABNORMAL HIGH (ref 65–99)
Potassium: 3.4 mmol/L — ABNORMAL LOW (ref 3.5–5.1)
Sodium: 139 mmol/L (ref 135–145)

## 2018-03-30 LAB — CBC
HCT: 27.6 % — ABNORMAL LOW (ref 39.0–52.0)
Hemoglobin: 9 g/dL — ABNORMAL LOW (ref 13.0–17.0)
MCH: 27.4 pg (ref 26.0–34.0)
MCHC: 32.6 g/dL (ref 30.0–36.0)
MCV: 84.1 fL (ref 78.0–100.0)
Platelets: 318 10*3/uL (ref 150–400)
RBC: 3.28 MIL/uL — ABNORMAL LOW (ref 4.22–5.81)
RDW: 14.2 % (ref 11.5–15.5)
WBC: 12.6 10*3/uL — ABNORMAL HIGH (ref 4.0–10.5)

## 2018-03-30 LAB — MAGNESIUM: Magnesium: 1.4 mg/dL — ABNORMAL LOW (ref 1.7–2.4)

## 2018-03-30 MED ORDER — MAGNESIUM SULFATE 4 GM/100ML IV SOLN
4.0000 g | Freq: Once | INTRAVENOUS | Status: AC
  Administered 2018-03-30: 4 g via INTRAVENOUS
  Filled 2018-03-30: qty 100

## 2018-03-30 NOTE — Progress Notes (Signed)
Physical Therapy Treatment Patient Details Name: Jeremiah [name] MRN: [id number] DOB: June 13, 1946 Today's Date: 03/30/2018    History of Present Illness 72 yo male admitted with CHF.     PT Comments    Progressing with mobility. O2 sats dropped to 86% on Ra during ambulation on today. Will continue to follow.    Follow Up Recommendations  Home health PT     Equipment Recommendations  None recommended by PT    Recommendations for Other Services       Precautions / Restrictions Precautions Precautions: Fall Precaution Comments: monitor O2 sats Restrictions Weight Bearing Restrictions: No    Mobility  Bed Mobility               General bed mobility comments: oob in recliner  Transfers Overall transfer level: Needs assistance Equipment used: Rolling walker (2 wheeled) Transfers: Sit to/from Stand Sit to Stand: Mod assist         General transfer comment: Assist to rise, stabilize, control descent.   Ambulation/Gait Ambulation/Gait assistance: Min guard Ambulation Distance (Feet): 70 Feet Assistive device: Rolling walker (2 wheeled) Gait Pattern/deviations: Step-through pattern     General Gait Details: Close guard for safety. 2 brief standing breaks. O2 sats dropped to 86% on RA during ambulation. Dyspnea 2/4.    Stairs             Wheelchair Mobility    Modified Rankin (Stroke Patients Only)       Balance                                            Cognition Arousal/Alertness: Awake/alert Behavior During Therapy: WFL for tasks assessed/performed Overall Cognitive Status: Within Functional Limits for tasks assessed                                        Exercises      General Comments        Pertinent Vitals/Pain Pain Assessment: No/denies pain    Home Living                      Prior Function            PT Goals (current goals can now be found in the care plan section)  Progress towards PT goals: Progressing toward goals    Frequency    Min 3X/week      PT Plan Current plan remains appropriate    Co-evaluation              AM-PAC PT "6 Clicks" Daily Activity  Outcome Measure  Difficulty turning over in bed (including adjusting bedclothes, sheets and blankets)?: A Lot Difficulty moving from lying on back to sitting on the side of the bed? : A Lot Difficulty sitting down on and standing up from a chair with arms (e.g., wheelchair, bedside commode, etc,.)?: Unable Help needed moving to and from a bed to chair (including a wheelchair)?: A Little Help needed walking in hospital room?: A Lot Help needed climbing 3-5 steps with a railing? : A Little 6 Click Score: 13    End of Session Equipment Utilized During Treatment: Gait belt Activity Tolerance: Patient tolerated treatment well Patient left: in chair;with call bell/phone within reach   PT Visit Diagnosis:  Muscle weakness (generalized) (M62.81);Difficulty in walking, not elsewhere classified (R26.2)     Time: 7829-5621 PT Time Calculation (min) (ACUTE ONLY): 26 min  Charges:  $Gait Training: 23-37 mins                    G Codes:         Rebeca Alert, MPT Pager: [phone]

## 2018-03-30 NOTE — Progress Notes (Signed)
     Patient Saturations on Room Air while Ambulating = 86%

## 2018-03-30 NOTE — Progress Notes (Signed)
Subjective:  Patient denies any chest pain or abdominal pain states breathing and leg swelling is markedly improved. Appreciate surgical consult  Objective:  Vital Signs in the last 24 hours: Temp:  [99.2 F (37.3 C)-99.6 F (37.6 C)] 99.6 F (37.6 C) (05/07 0430) Pulse Rate:  [73-85] 73 (05/07 0626) Resp:  [18-20] 18 (05/07 0430) BP: (127-156)/(57-72) 127/57 (05/07 0430) SpO2:  [93 %-98 %] 98 % (05/07 0626) Weight:  [89.1 kg (196 lb 6.9 oz)] 89.1 kg (196 lb 6.9 oz) (05/07 0430)  Intake/Output from previous day: 05/06 0701 - 05/07 0700 In: 290 [P.O.:120; IV Piggyback:150] Out: 1615 [Urine:1600; Drains:15] Intake/Output from this shift: Total I/O In: 10 [Other:10] Out: -   Physical Exam: Neck: no adenopathy, no carotid bruit, no JVD and supple, symmetrical, trachea midline Lungs: Decreased breath sound at bases Heart: regular rate and rhythm, S1, S2 normal and 2/6 systolic murmur noted Abdomen: soft, non-tender; bowel sounds normal; no masses,  no organomegaly Extremities: No clubbing cyanosis trace edema noted  Lab Results: Recent Labs    03/27/18 1239 03/30/18 0500  WBC 13.7* 12.6*  HGB 9.8* 9.0*  PLT 329 318   Recent Labs    03/29/18 0459 03/30/18 0500  NA 140 139  K 3.5 3.4*  CL 101 99*  CO2 25 28  GLUCOSE 127* 106*  BUN 16 14  CREATININE 1.74* 1.61*   Recent Labs    03/27/18 2310 03/28/18 0436  TROPONINI 0.08* 0.08*   Hepatic Function Panel Recent Labs    03/27/18 1239  PROT 7.8  ALBUMIN 2.6*  AST 20  ALT 11*  ALKPHOS 135*  BILITOT 0.8   No results for input(s): CHOL in the last 72 hours. No results for input(s): PROTIME in the last 72 hours.  Imaging: Imaging results have been reviewed and No results found.  Cardiac Studies:  Assessment/Plan:  Resolving acute diastolic congestive heart failure Hypertension Diabetes mellitus Chronic kidney disease stage III Hyperlipidemia Peripheral vascular disease Status post recent acute  cholecystitis status post percutaneous cholecystostomy drain Hypokalemia Plan As per orders Check labs in a.m. Will DC home once fully compensated   LOS: 3 days    Rinaldo Cloud 03/30/2018, 10:48 AM

## 2018-03-30 NOTE — Consult Note (Signed)
Reason for Consult:Gallbladder Referring Physician: Dr. Mammie Lorenzo is an 72 y.o. male.  HPI: The patient is a 72 year old black male who we have been following for cholecystitis. He has a perc chole drain in place. He denies any abd pain. He was admitted with congestive heart failure  Past Medical History:  Diagnosis Date  . Arthritis   . Diabetes mellitus without complication ([place])   . Hypertension   . Peripheral vascular disease [facility])     Past Surgical History:  Procedure Laterality Date  . IR CHOLANGIOGRAM EXISTING TUBE  03/17/2018  . IR PERC CHOLECYSTOSTOMY  01/31/2018  . IR RADIOLOGIST EVAL & MGMT  03/02/2018    Family History  Problem Relation Age of Onset  . Diabetes Mother   . Hypertension Mother   . Heart disease Father   . Heart attack Father   . Diabetes Sister   . Hypertension Sister   . Diabetes Brother   . Heart disease Brother   . Hypertension Brother   . Heart attack Brother     Social History:  reports that he quit smoking about 34 years ago. He has never used smokeless tobacco. He reports that he does not drink alcohol or use drugs.  Allergies: No Known Allergies  Medications: I have reviewed the patient's current medications.  Results for orders placed or performed during the hospital encounter of 03/27/18 (from the past 48 hour(s))  Glucose, capillary     Status: Abnormal   Collection Time: 03/28/18 11:15 AM  Result Value Ref Range   Glucose-Capillary 282 (H) 65 - 99 mg/dL  Glucose, capillary     Status: None   Collection Time: 03/28/18  4:28 PM  Result Value Ref Range   Glucose-Capillary 92 65 - 99 mg/dL  Glucose, capillary     Status: Abnormal   Collection Time: 03/28/18 10:02 PM  Result Value Ref Range   Glucose-Capillary 199 (H) 65 - 99 mg/dL  Basic metabolic panel     Status: Abnormal   Collection Time: 03/29/18  4:59 AM  Result Value Ref Range   Sodium 140 135 - 145 mmol/L   Potassium 3.5 3.5 - 5.1 mmol/L   Chloride 101  101 - 111 mmol/L   CO2 25 22 - 32 mmol/L   Glucose, Bld 127 (H) 65 - 99 mg/dL   BUN 16 6 - 20 mg/dL   Creatinine, Ser 1.74 (H) 0.61 - 1.24 mg/dL   Calcium 8.6 (L) 8.9 - 10.3 mg/dL   GFR calc non Af Amer 37 (L) >60 mL/min   GFR calc Af Amer 43 (L) >60 mL/min    Comment: (NOTE) The eGFR has been calculated using the CKD EPI equation. This calculation has not been validated in all clinical situations. eGFR's persistently <60 mL/min signify possible Chronic Kidney Disease.    Anion gap 14 5 - 15    Comment: Performed at [facility], [place] [address] [place], [place] [address]  Glucose, capillary     Status: Abnormal   Collection Time: 03/29/18  7:56 AM  Result Value Ref Range   Glucose-Capillary 153 (H) 65 - 99 mg/dL  Glucose, capillary     Status: Abnormal   Collection Time: 03/29/18 12:00 PM  Result Value Ref Range   Glucose-Capillary 197 (H) 65 - 99 mg/dL  Glucose, capillary     Status: Abnormal   Collection Time: 03/29/18  5:15 PM  Result Value Ref Range   Glucose-Capillary 168 (H) 65 -  99 mg/dL  Glucose, capillary     Status: Abnormal   Collection Time: 03/29/18  8:49 PM  Result Value Ref Range   Glucose-Capillary 159 (H) 65 - 99 mg/dL  Basic metabolic panel     Status: Abnormal   Collection Time: 03/30/18  5:00 AM  Result Value Ref Range   Sodium 139 135 - 145 mmol/L   Potassium 3.4 (L) 3.5 - 5.1 mmol/L   Chloride 99 (L) 101 - 111 mmol/L   CO2 28 22 - 32 mmol/L   Glucose, Bld 106 (H) 65 - 99 mg/dL   BUN 14 6 - 20 mg/dL   Creatinine, Ser 1.61 (H) 0.61 - 1.24 mg/dL   Calcium 8.7 (L) 8.9 - 10.3 mg/dL   GFR calc non Af Amer 40 (L) >60 mL/min   GFR calc Af Amer 47 (L) >60 mL/min    Comment: (NOTE) The eGFR has been calculated using the CKD EPI equation. This calculation has not been validated in all clinical situations. eGFR's persistently <60 mL/min signify possible Chronic Kidney Disease.    Anion gap 12 5 - 15    Comment: Performed at [facility], [place] [address], [place], [place] [address]  Magnesium     Status: Abnormal   Collection Time: 03/30/18  5:00 AM  Result Value Ref Range   Magnesium 1.4 (L) 1.7 - 2.4 mg/dL    Comment: Performed at [facility], [place] [address], [place], [place] [address]  CBC     Status: Abnormal   Collection Time: 03/30/18  5:00 AM  Result Value Ref Range   WBC 12.6 (H) 4.0 - 10.5 K/uL   RBC 3.28 (L) 4.22 - 5.81 MIL/uL   Hemoglobin 9.0 (L) 13.0 - 17.0 g/dL   HCT 27.6 (L) 39.0 - 52.0 %   MCV 84.1 78.0 - 100.0 fL   MCH 27.4 26.0 - 34.0 pg   MCHC 32.6 30.0 - 36.0 g/dL   RDW 14.2 11.5 - 15.5 %   Platelets 318 150 - 400 K/uL    Comment: Performed at [facility], [place] [address], [place], [place] [address]  Glucose, capillary     Status: Abnormal   Collection Time: 03/30/18  7:22 AM  Result Value Ref Range   Glucose-Capillary 104 (H) 65 - 99 mg/dL    No results found.  Review of Systems  Constitutional: Positive for malaise/fatigue.  HENT: Negative.   Eyes: Negative.   Respiratory: Positive for shortness of breath.   Cardiovascular: Positive for leg swelling.  Gastrointestinal: Negative for abdominal pain.  Genitourinary: Negative.   Musculoskeletal: Negative.   Skin: Negative.   Neurological: Negative.   Endo/Heme/Allergies: Negative.   Psychiatric/Behavioral: Negative.    Blood pressure (!) 127/57, pulse 73, temperature 99.6 F (37.6 C), temperature source Oral, resp. rate 18, height _0  (1.753 m), weight 89.1 kg (196 lb 6.9 oz), SpO2 98 %. Physical Exam  Constitutional: He is oriented to person, place, and time. He appears well-developed and well-nourished. No distress.  HENT:  Head: Normocephalic and atraumatic.  Mouth/Throat: No oropharyngeal exudate.  Eyes: Pupils are equal, round, and reactive to light. Conjunctivae and EOM are normal.  Neck: Normal range of motion. Neck supple.  Cardiovascular: Normal rate, regular rhythm  and normal heart sounds.  Respiratory: Effort normal and breath sounds normal. No stridor. No respiratory distress.  GI: Soft. He exhibits no distension. There is no tenderness.  Musculoskeletal: Normal range of motion. He exhibits edema.  He exhibits no tenderness.  Neurological: He is alert and oriented to person, place, and time. Coordination normal.  Skin: Skin is warm and dry. No rash noted.  Psychiatric: He has a normal mood and affect. His behavior is normal. Thought content normal.    Assessment/Plan: The patient is awaiting his definitive surgery for his gallbladder which is scheduled for June 6. He is admitted now with CHF. I would think this is not the optimal time to do his surgery unless cardiology insists. He would need clearance. Otherwise we will plan for his surgery on June 6  [facility] 03/30/2018, 10:32 AM

## 2018-03-30 NOTE — Plan of Care (Signed)
  Problem: Education: Goal: Knowledge of General Education information will improve Outcome: Progressing   Problem: Health Behavior/Discharge Planning: Goal: Ability to manage health-related needs will improve Outcome: Progressing   Problem: Clinical Measurements: Goal: Will remain free from infection Outcome: Progressing Goal: Diagnostic test results will improve Outcome: Progressing Goal: Cardiovascular complication will be avoided Outcome: Progressing   Problem: Education: Goal: Ability to demonstrate management of disease process will improve Outcome: Progressing Goal: Ability to verbalize understanding of medication therapies will improve Outcome: Progressing   Problem: Activity: Goal: Capacity to carry out activities will improve Outcome: Progressing   Problem: Cardiac: Goal: Ability to achieve and maintain adequate cardiopulmonary perfusion will improve Outcome: Progressing   

## 2018-03-30 NOTE — Care Management Important Message (Signed)
Important Message  Patient Details  Name: Jeremiah [name] MRN: [id number] Date of Birth: 07-Feb-1946   Medicare Important Message Given:  Yes    Caren Macadam 03/30/2018, 12:59 PMImportant Message  Patient Details  Name: Jeremiah [name] MRN: [id number] Date of Birth: [birth year]   Medicare Important Message Given:  Yes    Caren Macadam 03/30/2018, 12:59 PM

## 2018-03-30 NOTE — Progress Notes (Signed)
Spoke with pt and wife concerning HHPT/RN. Wife selected [facility]. Referral given to in house rep.

## 2018-03-31 LAB — BASIC METABOLIC PANEL
Anion gap: 13 (ref 5–15)
BUN: 13 mg/dL (ref 6–20)
CO2: 28 mmol/L (ref 22–32)
Calcium: 8.7 mg/dL — ABNORMAL LOW (ref 8.9–10.3)
Chloride: 96 mmol/L — ABNORMAL LOW (ref 101–111)
Creatinine, Ser: 1.63 mg/dL — ABNORMAL HIGH (ref 0.61–1.24)
GFR calc Af Amer: 46 mL/min — ABNORMAL LOW (ref >60)
GFR calc non Af Amer: 40 mL/min — ABNORMAL LOW (ref >60)
Glucose, Bld: 175 mg/dL — ABNORMAL HIGH (ref 65–99)
Potassium: 3.4 mmol/L — ABNORMAL LOW (ref 3.5–5.1)
Sodium: 137 mmol/L (ref 135–145)

## 2018-03-31 LAB — CBC
HCT: 28.7 % — ABNORMAL LOW (ref 39.0–52.0)
Hemoglobin: 9.2 g/dL — ABNORMAL LOW (ref 13.0–17.0)
MCH: 26.9 pg (ref 26.0–34.0)
MCHC: 32.1 g/dL (ref 30.0–36.0)
MCV: 83.9 fL (ref 78.0–100.0)
Platelets: 315 10*3/uL (ref 150–400)
RBC: 3.42 MIL/uL — ABNORMAL LOW (ref 4.22–5.81)
RDW: 14.2 % (ref 11.5–15.5)
WBC: 13.4 10*3/uL — ABNORMAL HIGH (ref 4.0–10.5)

## 2018-03-31 LAB — GLUCOSE, CAPILLARY
Glucose-Capillary: 177 mg/dL — ABNORMAL HIGH (ref 65–99)
Glucose-Capillary: 250 mg/dL — ABNORMAL HIGH (ref 65–99)
Glucose-Capillary: 205 mg/dL — ABNORMAL HIGH (ref 65–99)
Glucose-Capillary: 160 mg/dL — ABNORMAL HIGH (ref 65–99)

## 2018-03-31 LAB — BRAIN NATRIURETIC PEPTIDE
B Natriuretic Peptide: 526.8 pg/mL — ABNORMAL HIGH (ref 0.0–100.0)
B Natriuretic Peptide: 617.4 pg/mL — ABNORMAL HIGH (ref 0.0–100.0)

## 2018-03-31 LAB — MAGNESIUM: Magnesium: 2 mg/dL (ref 1.7–2.4)

## 2018-03-31 MED ORDER — POTASSIUM CHLORIDE ER 10 MEQ PO TBCR
40.0000 meq | EXTENDED_RELEASE_TABLET | Freq: Once | ORAL | Status: AC
  Administered 2018-03-31: 40 meq via ORAL
  Filled 2018-03-31 (×2): qty 4

## 2018-03-31 MED ORDER — METOPROLOL SUCCINATE ER 50 MG PO TB24
50.0000 mg | ORAL_TABLET | Freq: Every day | ORAL | Status: DC
  Administered 2018-03-31 – 2018-04-01 (×2): 50 mg via ORAL
  Filled 2018-03-31 (×2): qty 1

## 2018-03-31 MED ORDER — FUROSEMIDE 10 MG/ML IJ SOLN
40.0000 mg | Freq: Once | INTRAMUSCULAR | Status: AC
  Administered 2018-03-31: 40 mg via INTRAVENOUS
  Filled 2018-03-31: qty 4

## 2018-03-31 NOTE — Progress Notes (Signed)
Inpatient Diabetes Program Recommendations  AACE/ADA: New Consensus Statement on Inpatient Glycemic Control (2015)  Target Ranges:  Prepandial:   less than 140 mg/dL      Peak postprandial:   less than 180 mg/dL (1-2 hours)      Critically ill patients:  140 - 180 mg/dL   Lab Results  Component Value Date   GLUCAP 250 (H) 03/31/2018   HGBA1C 8.8 (H) 02/01/2018    Review of Glycemic Control  Diabetes history: DM2 Outpatient Diabetes medications: Amaryl 4 mg QAM, Actos 30 mg QAM Current orders for Inpatient glycemic control: Novolog 0-15 units tidwc, Amaryl 4 mg QAM  Post-prandial blood sugars > 180 mg/dL. May benefit from addition of meal coverage insulin.  Inpatient Diabetes Program Recommendations:     Add Novolog 3 units tidwc for meal coverage insulin. (Do not give if pt eats < 50% meal)  Continue to follow.  Thank you. Ailene Ards, RD, LDN, CDE Inpatient Diabetes Coordinator [phone]

## 2018-03-31 NOTE — Progress Notes (Signed)
PT Cancellation Note  Patient Details Name: Jeremiah [name] MRN: [id number] DOB: [birth year]   Cancelled Treatment:    Reason Eval/Treat Not Completed: Attempted PT tx session-pt politely declined participation on today. He requested PT check back tomorrow.    Rebeca Alert, MPT Pager: [phone]

## 2018-03-31 NOTE — Evaluation (Signed)
Occupational Therapy Evaluation Patient Details Name: Jeremiah [name] MRN: [id number] DOB: 1946/07/12 Today's Date: 03/31/2018    History of Present Illness 72 yo male admitted with CHF.    Clinical Impression   This 72 year old man was admitted for the above.  He recently had assistance with adls from wife due to placement of chole drain.  He will benefit from continued OT in acute to increase activity tolerance and mobility related to adls to decrease burden of care.      Follow Up Recommendations  Supervision/Assistance - 24 hour    Equipment Recommendations  3 in 1 bedside commode    Recommendations for Other Services       Precautions / Restrictions Precautions Precautions: Fall Precaution Comments: monitor O2 sats Restrictions Weight Bearing Restrictions: No      Mobility Bed Mobility Overal bed mobility: Needs Assistance Bed Mobility: Supine to Sit     Supine to sit: Min assist;HOB elevated     General bed mobility comments: light assist for LLE and trunk. Pt used Publishing rights manager used: Agricultural consultant (2 wheeled)   Sit to Stand: Min assist         General transfer comment: assist to stand and stabilize.  From high bed and 3:1 commode    Balance                                           ADL either performed or assessed with clinical judgement   ADL Overall ADL's : Needs assistance/impaired Eating/Feeding: Independent   Grooming: Set up;Sitting   Upper Body Bathing: Minimal assistance;Sitting   Lower Body Bathing: Moderate assistance;Maximal assistance;Sit to/from stand   Upper Body Dressing : Minimal assistance;Sitting   Lower Body Dressing: Maximal assistance;Sit to/from stand   Toilet Transfer: Minimal assistance;Stand-pivot;Regular Toilet;RW   Toileting- Clothing Manipulation and Hygiene: Total assistance;Sit to/from stand         General ADL Comments: pt did not feel he could perform toilet hygiene.   Wife has been assisting with adls at home     Vision         Perception     Praxis      Pertinent Vitals/Pain Pain Assessment: No/denies pain     Hand Dominance     Extremity/Trunk Assessment Upper Extremity Assessment Upper Extremity Assessment: Generalized weakness           Communication Communication Communication: No difficulties   Cognition Arousal/Alertness: Awake/alert Behavior During Therapy: WFL for tasks assessed/performed Overall Cognitive Status: Within Functional Limits for tasks assessed                                     General Comments       Exercises     Shoulder Instructions      Home Living Family/patient expects to be discharged to:: Private residence Living Arrangements: Spouse/significant other Available Help at Discharge: Family               Bathroom Shower/Tub: Walk-in shower   Bathroom Toilet: Handicapped height     Home Equipment: Environmental consultant - 2 wheels;Shower seat   Additional Comments: wife has been helping with sponge bathing due to drain      Prior Functioning/Environment Level of Independence: Needs assistance  Comments: wife has been helping with adls since he had drain placed        OT Problem List: Decreased strength;Decreased activity tolerance;Impaired balance (sitting and/or standing);Decreased knowledge of use of DME or AE;Cardiopulmonary status limiting activity      OT Treatment/Interventions: Self-care/ADL training;DME and/or AE instruction;Patient/family education;Balance training;Therapeutic activities    OT Goals(Current goals can be found in the care plan section) Acute Rehab OT Goals Patient Stated Goal: home OT Goal Formulation: With patient Time For Goal Achievement: 04/14/18 Potential to Achieve Goals: Good ADL Goals Pt Will Transfer to Toilet: with min guard assist;ambulating;bedside commode Pt Will Perform Toileting - Clothing Manipulation and hygiene: with min  assist;sit to/from stand Additional ADL Goal #1: pt will get up from flat bed at min guard level in preparation for adls Additional ADL Goal #2: pt will initiate at least one rest break during adls  OT Frequency: Min 2X/week   Barriers to D/C:            Co-evaluation              AM-PAC PT "6 Clicks" Daily Activity     Outcome Measure Help from another person eating meals?: None Help from another person taking care of personal grooming?: A Little Help from another person toileting, which includes using toliet, bedpan, or urinal?: A Lot Help from another person bathing (including washing, rinsing, drying)?: A Lot Help from another person to put on and taking off regular upper body clothing?: A Little Help from another person to put on and taking off regular lower body clothing?: A Lot 6 Click Score: 16   End of Session    Activity Tolerance: Patient tolerated treatment well Patient left: in chair;with call bell/phone within reach;with nursing/sitter in room  OT Visit Diagnosis: Muscle weakness (generalized) (M62.81)                Time: 1610-9604 OT Time Calculation (min): 26 min Charges:  OT General Charges $OT Visit: 1 Visit OT Evaluation $OT Eval Low Complexity: 1 Low OT Treatments $Self Care/Home Management : 8-22 mins G-Codes:     [place], OTR/L [phone] 03/31/2018  Khadeem Rockett 03/31/2018, 8:48 AM

## 2018-03-31 NOTE — Progress Notes (Signed)
Subjective:  Complained of shortness of breath with minimal exertion with drop in O2 sats to 84%. Denies any chest pain. Denies abdominal pain  Objective:  Vital Signs in the last 24 hours: Temp:  [99.4 F (37.4 C)-100 F (37.8 C)] 99.4 F (37.4 C) (05/08 0356) Pulse Rate:  [77-85] 84 (05/08 0356) Resp:  [18-20] 18 (05/08 0355) BP: (120-127)/(55-66) 127/66 (05/08 0355) SpO2:  [89 %-94 %] 94 % (05/08 0356) Weight:  [89.6 kg (197 lb 8 oz)] 89.6 kg (197 lb 8 oz) (05/08 0500)  Intake/Output from previous day: 05/07 0701 - 05/08 0700 In: 350 [P.O.:120; IV Piggyback:200] Out: -  Intake/Output from this shift: Total I/O In: 410 [P.O.:360; IV Piggyback:50] Out: 625 [Urine:625]  Physical Exam: Neck: no adenopathy, no carotid bruit, no JVD and supple, symmetrical, trachea midline Lungs: decreased breath sound at at bases with faint rales Heart: regular rate and rhythm, regularly irregular rhythm and soft systolic murmur noted Abdomen: soft, non-tender; bowel sounds normal; no masses,  no organomegaly Extremities: no clubbing cyanosis trace edema noted  Lab Results: Recent Labs    03/30/18 0500 03/31/18 0519  WBC 12.6* 13.4*  HGB 9.0* 9.2*  PLT 318 315   Recent Labs    03/30/18 0500 03/31/18 0519  NA 139 137  K 3.4* 3.4*  CL 99* 96*  CO2 28 28  GLUCOSE 106* 175*  BUN 14 13  CREATININE 1.61* 1.63*   No results for input(s): TROPONINI in the last 72 hours.  Invalid input(s): CK, MB Hepatic Function Panel No results for input(s): PROT, ALBUMIN, AST, ALT, ALKPHOS, BILITOT, BILIDIR, IBILI in the last 72 hours. No results for input(s): CHOL in the last 72 hours. No results for input(s): PROTIME in the last 72 hours.  Imaging: Imaging results have been reviewed and No results found.  Cardiac Studies:  Assessment/Plan:  Resolving acute diastolic congestive heart failure Hypertension Diabetes mellitus Chronic kidney disease stage III Hyperlipidemia Peripheral  vascular disease Status post recent acute cholecystitis status post percutaneous cholecystostomy drain Hypokalemia Plan Lasix 40 mg IV extra dose 1 today Replace K Monitor renal function   LOS: 4 days    Jeremiah [name] 03/31/2018, 6:31 PM

## 2018-04-01 LAB — BASIC METABOLIC PANEL
Anion gap: 15 (ref 5–15)
BUN: 12 mg/dL (ref 6–20)
CO2: 30 mmol/L (ref 22–32)
Calcium: 9 mg/dL (ref 8.9–10.3)
Chloride: 94 mmol/L — ABNORMAL LOW (ref 101–111)
Creatinine, Ser: 1.55 mg/dL — ABNORMAL HIGH (ref 0.61–1.24)
GFR calc Af Amer: 49 mL/min — ABNORMAL LOW (ref >60)
GFR calc non Af Amer: 42 mL/min — ABNORMAL LOW (ref >60)
Glucose, Bld: 112 mg/dL — ABNORMAL HIGH (ref 65–99)
Potassium: 3.8 mmol/L (ref 3.5–5.1)
Sodium: 139 mmol/L (ref 135–145)

## 2018-04-01 LAB — CULTURE, BLOOD (ROUTINE X 2)
Culture: NO GROWTH
Culture: NO GROWTH
Special Requests: ADEQUATE

## 2018-04-01 LAB — CBC
HCT: 28.6 % — ABNORMAL LOW (ref 39.0–52.0)
Hemoglobin: 9.2 g/dL — ABNORMAL LOW (ref 13.0–17.0)
MCH: 26.9 pg (ref 26.0–34.0)
MCHC: 32.2 g/dL (ref 30.0–36.0)
MCV: 83.6 fL (ref 78.0–100.0)
Platelets: 336 10*3/uL (ref 150–400)
RBC: 3.42 MIL/uL — ABNORMAL LOW (ref 4.22–5.81)
RDW: 14.1 % (ref 11.5–15.5)
WBC: 14.2 10*3/uL — ABNORMAL HIGH (ref 4.0–10.5)

## 2018-04-01 LAB — MAGNESIUM: Magnesium: 1.8 mg/dL (ref 1.7–2.4)

## 2018-04-01 LAB — GLUCOSE, CAPILLARY
Glucose-Capillary: 119 mg/dL — ABNORMAL HIGH (ref 65–99)
Glucose-Capillary: 161 mg/dL — ABNORMAL HIGH (ref 65–99)

## 2018-04-01 MED ORDER — LEVOFLOXACIN 250 MG PO TABS: 250.0000 mg | ORAL_TABLET | Freq: Every day | ORAL | 0 refills | Status: AC

## 2018-04-01 MED ORDER — POTASSIUM CHLORIDE CRYS ER 20 MEQ PO TBCR: 20.0000 meq | EXTENDED_RELEASE_TABLET | Freq: Two times a day (BID) | ORAL | 3 refills | Status: DC

## 2018-04-01 MED ORDER — METOPROLOL SUCCINATE ER 50 MG PO TB24: 50.0000 mg | ORAL_TABLET | Freq: Every day | ORAL | 3 refills | Status: DC

## 2018-04-01 MED ORDER — FUROSEMIDE 40 MG PO TABS: 40.0000 mg | ORAL_TABLET | Freq: Two times a day (BID) | ORAL | 11 refills | Status: DC

## 2018-04-01 NOTE — Discharge Summary (Signed)
Physician Discharge Summary  Patient ID: Jeremiah [name] MRN: [id number] DOB/AGE: [birth year] 72 y.o.  Admit date: 03/27/2018 Discharge date: 04/01/2018  Admission Diagnoses: Acute left heart systolic failure Type 2 diabetes mellitus Hypertension Status post percutaneous cholecystostomy drain Discharge Diagnoses:  Decompensated acute diastolic congestive heart failure Hypertension Diabetes mellitus Chronic kidney disease stage III Hyperlipidemia Peripheral vascular disease History of acute cholecystitis in the past status post percutaneous choleccystostomy drain   Discharged Condition: good  Hospital Course: patient is 72 year old male with past medical history significant for diabetes mellitus hypertension peripheral vascular disease status post recent cholecystitis and percutaneous cholecystostomy drain placement was admitted by  Dr. Algie Coffer because of four-day history of shortness of breath associated with cough nausea and bilateral leg swelling and was noted to be in decompensated congestive heart failure.chest x-ray was suggestive of pneumonia versus CHF with atelectasis and was noted to have mildly elevated BNP. Patient state he has been drinking plenty of fluid lately and has not been compliant with diet.  Consults: general surgery  Significant Diagnostic Studies: labs: sodium was 137 potassium 4.6 BUN 15 creatinine 1.39 BNP was 439 troponin I was 0.05 0.07 0.08 and 0.08 hemoglobin was 9.8 hematocrit 29.2 white count of 13.7 and cardiac graphics: ECG: EKG showed normal sinus rhythm with no acute ischemic changes and Echocardiogram: showed normal LV systolic function with mild diastolic dysfunction  Treatments: patient was admitted to telemetry unit and was started on IV Lasix and broad spectrum antibiotics with good diuresis and improvement in his symptoms patient did not had any episodes of chest pain during the hospital stay patient also remained afebrile during the hospital stay  surgical consultation was called patient was felt not to be the good candidate for acute cholecystectomy in view of recent decompensated heart failure. His IV Lasix has been switched to by mouth and IV antibiotics has been switched to by mouth Levaquin patient is ambulating in room without any problems and will be followed by me in one week we'll arrange for nuclear stress test as outpatient prior to surgical clearance in view of new onset of decompensated heart failure.  Discharge Exam: Blood pressure (!) 119/58, pulse 75, temperature 99.2 F (37.3 C), temperature source Oral, resp. rate 18, height  (1.753 m), weight 89.4 kg (197 lb 1.6 oz), SpO2 95 %. Neck: no adenopathy, no carotid bruit, no JVD, supple, symmetrical, trachea midline and thyroid not enlarged, symmetric, no tenderness/mass/nodules Cardio: regular rate and rhythm, S1, S2 normal and soft systolic murmur noted GI: soft, non-tender; bowel sounds normal; no masses,  no organomegaly Extremities: extremities normal, atraumatic, no cyanosis or edema Neurologic: Alert and oriented X 3, normal strength and tone. Normal symmetric reflexes. Normal coordination and gait  Disposition:  home  Allergies as of 04/01/2018   No Known Allergies     Medication List    STOP taking these medications   amLODipine 10 MG tablet Commonly known as:  NORVASC   pioglitazone 30 MG tablet Commonly known as:  ACTOS     TAKE these medications   aspirin 81 MG tablet Take 81 mg by mouth daily.   brimonidine 0.2 % ophthalmic solution Commonly known as:  ALPHAGAN Place 1 drop into both eyes 2 (two) times daily.   furosemide 40 MG tablet Commonly known as:  LASIX Take 1 tablet (40 mg total) by mouth 2 (two) times daily.   glimepiride 4 MG tablet Commonly known as:  AMARYL Take 1 tablet (4 mg total) by mouth daily with  breakfast.   losartan 100 MG tablet Commonly known as:  COZAAR Take 100 mg by mouth daily.   metoprolol succinate 50 MG  24 hr tablet Commonly known as:  TOPROL-XL Take 1 tablet (50 mg total) by mouth daily. Take with or immediately following a meal. Start taking on:  04/02/2018   Normal Saline Flush 0.9 % Soln Give 10 mLs by tube daily.   potassium chloride SA 20 MEQ tablet Commonly known as:  K-DUR,KLOR-CON Take 1 tablet (20 mEq total) by mouth 2 (two) times daily.   simvastatin 40 MG tablet Commonly known as:  ZOCOR Take 40 mg by mouth daily.   timolol 0.5 % ophthalmic solution Commonly known as:  TIMOPTIC Place 1 drop into both eyes 2 (two) times daily.      Follow-up Information    Rinaldo Cloud, MD. Call in 1 week(s).   Specialty:  Cardiology Contact information: [address] [place] [address] [phone]           Signed: Rinaldo Cloud 04/01/2018, 11:55 AM

## 2018-04-01 NOTE — Progress Notes (Signed)
Report received from K. Senaida Ores, RN. No change from initial pm assessment. Will continue to monitor and follow the POC.

## 2018-04-01 NOTE — Progress Notes (Signed)
Discharge instructions given. Pt verbalized understanding and all questions were answered.  

## 2018-04-01 NOTE — Progress Notes (Signed)
Pharmacy Antibiotic Note  Jeremiah [name] is a 72 y.o. male s/p cholecystostomy with drain placement on 03/17/18 presented to the ED on 03/27/2018 with SOB.  [name] currently on zosyn for intra-abdominal infection.  Today, 04/01/2018: - day #6 abx - afeb, wbc elevated at 14.2 - scr elevated but somewhat stable at 1.55 (crcl~46)  Plan: - continue zosyn 3.375 gm IV q8h (infuse over 4 hours) - monitor renal function closely - f/u plan/LOT for abx  ________________________________________  Height:  (175.3 cm) Weight: 197 lb 1.6 oz (89.4 kg) IBW/kg (Calculated) : 70.7  Temp (24hrs), Avg:99.2 F (37.3 C), Min:99.1 F (37.3 C), Max:99.2 F (37.3 C)  Recent Labs  Lab 03/27/18 1239 03/28/18 0436 03/29/18 0459 03/30/18 0500 03/31/18 0519 04/01/18 0406  WBC 13.7*  --   --  12.6* 13.4* 14.2*  CREATININE 1.39* 1.53* 1.74* 1.61* 1.63* 1.55*    Estimated Creatinine Clearance: 46.2 mL/min (A) (by C-G formula based on SCr of 1.55 mg/dL (H)).    No Known Allergies  Antimicrobials this admission: 5/4 zosyn >>  5/4 vancomycin >> 5/6  Dose adjustments this admission: 5/6: change vanc from 1g q24 to  q48 due to rise in SCr - est AUC 484 using SCr 1.74, IBW for CrCl and adjBW for Vd  Microbiology results: 5/4 BCx x2: ngtd  Thank you for allowing pharmacy to be a part of this patient's care.  Lucia Gaskins 04/01/2018 10:09 AM

## 2018-04-01 NOTE — Discharge Instructions (Signed)
Heart Failure Heart failure is a condition in which the heart has trouble pumping blood because it has become weak or stiff. This means that the heart does not pump blood efficiently for the body to work well. For some people with heart failure, fluid may back up into the lungs and there may be swelling (edema) in the lower legs. Heart failure is usually a long-term (chronic) condition. It is important for you to take good care of yourself and follow the treatment plan from your health care provider. What are the causes? This condition is caused by some health problems, including:  High blood pressure (hypertension). Hypertension causes the heart muscle to work harder than normal. High blood pressure eventually causes the heart to become stiff and weak.  Coronary artery disease (CAD). CAD is the buildup of cholesterol and fat (plaques) in the arteries of the heart.  Heart attack (myocardial infarction). Injured tissue, which is caused by the heart attack, does not contract as well and the heart's ability to pump blood is weakened.  Abnormal heart valves. When the heart valves do not open and close properly, the heart muscle must pump harder to keep the blood flowing.  Heart muscle disease (cardiomyopathy or myocarditis). Heart muscle disease is damage to the heart muscle from a variety of causes, such as drug or alcohol abuse, infections, or unknown causes. These can increase the risk of heart failure.  Lung disease. When the lungs do not work properly, the heart must work harder.  What increases the risk? Risk of heart failure increases as a person ages. This condition is also more likely to develop in people who:  Are overweight.  Are male.  Smoke or chew tobacco.  Abuse alcohol or illegal drugs.  Have taken medicines that can damage the heart, such as chemotherapy drugs.  Have diabetes. ? High blood sugar (glucose) is associated with high fat (lipid) levels in the blood. ? Diabetes  can also damage tiny blood vessels that carry nutrients to the heart muscle.  Have abnormal heart rhythms.  Have thyroid problems.  Have low blood counts (anemia).  What are the signs or symptoms? Symptoms of this condition include:  Shortness of breath with activity, such as when climbing stairs.  Persistent cough.  Swelling of the feet, ankles, legs, or abdomen.  Unexplained weight gain.  Difficulty breathing when lying flat (orthopnea).  Waking from sleep because of the need to sit up and get more air.  Rapid heartbeat.  Fatigue and loss of energy.  Feeling light-headed, dizzy, or close to fainting.  Loss of appetite.  Nausea.  Increased urination during the night (nocturia).  Confusion.  How is this diagnosed? This condition is diagnosed based on:  Medical history, symptoms, and a physical exam.  Diagnostic tests, which may include: ? Echocardiogram. ? Electrocardiogram (ECG). ? Chest X-ray. ? Blood tests. ? Exercise stress test. ? Radionuclide scans. ? Cardiac catheterization and angiogram.  How is this treated? Treatment for this condition is aimed at managing the symptoms of heart failure. Medicines, behavioral changes, or other treatments may be necessary to treat heart failure. Medicines These may include:  Angiotensin-converting enzyme (ACE) inhibitors. This type of medicine blocks the effects of a blood protein called angiotensin-converting enzyme. ACE inhibitors relax (dilate) the blood vessels and help to lower blood pressure.  Angiotensin receptor blockers (ARBs). This type of medicine blocks the actions of a blood protein called angiotensin. ARBs dilate the blood vessels and help to lower blood pressure.  Water   pills (diuretics). Diuretics cause the kidneys to remove salt and water from the blood. The extra fluid is removed through urination, leaving a lower volume of blood that the heart has to pump.  Beta blockers. These improve heart  muscle strength and they prevent the heart from beating too quickly.  Digoxin. This increases the force of the heartbeat.  Healthy behavior changes These may include:  Reaching and maintaining a healthy weight.  Stopping smoking or chewing tobacco.  Eating heart-healthy foods.  Limiting or avoiding alcohol.  Stopping use of street drugs (illegal drugs).  Physical activity.  Other treatments These may include:  Surgery to open blocked coronary arteries or repair damaged heart valves.  Placement of a biventricular pacemaker to improve heart muscle function (cardiac resynchronization therapy). This device paces both the right ventricle and left ventricle.  Placement of a device to treat serious abnormal heart rhythms (implantable cardioverter defibrillator, or ICD).  Placement of a device to improve the pumping ability of the heart (left ventricular assist device, or LVAD).  Heart transplant. This can cure heart failure, and it is considered for certain patients who do not improve with other therapies.  Follow these instructions at home: Medicines  Take over-the-counter and prescription medicines only as told by your health care provider. Medicines are important in reducing the workload of your heart, slowing the progression of heart failure, and improving your symptoms. ? Do not stop taking your medicine unless your health care provider told you to do that. ? Do not skip any dose of medicine. ? Refill your prescriptions before you run out of medicine. You need your medicines every day. Eating and drinking   Eat heart-healthy foods. Talk with a dietitian to make an eating plan that is right for you. ? Choose foods that contain no trans fat and are low in saturated fat and cholesterol. Healthy choices include fresh or frozen fruits and vegetables, fish, lean meats, legumes, fat-free or low-fat dairy products, and whole-grain or high-fiber foods. ? Limit salt (sodium) if  directed by your health care provider. Sodium restriction may reduce symptoms of heart failure. Ask a dietitian to recommend heart-healthy seasonings. ? Use healthy cooking methods instead of frying. Healthy methods include roasting, grilling, broiling, baking, poaching, steaming, and stir-frying.  Limit your fluid intake if directed by your health care provider. Fluid restriction may reduce symptoms of heart failure. Lifestyle  Stop smoking or using chewing tobacco. Nicotine and tobacco can damage your heart and your blood vessels. Do not use nicotine gum or patches before talking to your health care provider.  Limit alcohol intake to no more than 1 drink per day for non-pregnant women and 2 drinks per day for men. One drink equals 12 oz of beer, 5 oz of wine, or 1 oz of hard liquor. ? Drinking more than that is harmful to your heart. Tell your health care provider if you drink alcohol several times a week. ? Talk with your health care provider about whether any level of alcohol use is safe for you. ? If your heart has already been damaged by alcohol or you have severe heart failure, drinking alcohol should be stopped completely.  Stop use of illegal drugs.  Lose weight if directed by your health care provider. Weight loss may reduce symptoms of heart failure.  Do moderate physical activity if directed by your health care provider. People who are elderly and people with severe heart failure should consult with a health care provider for physical activity recommendations.   Monitor important information  Weigh yourself every day. Keeping track of your weight daily helps you to notice excess fluid sooner. ? Weigh yourself every morning after you urinate and before you eat breakfast. ? Wear the same amount of clothing each time you weigh yourself. ? Record your daily weight. Provide your health care provider with your weight record.  Monitor and record your blood pressure as told by your health  care provider.  Check your pulse as told by your health care provider. Dealing with extreme temperatures  If the weather is extremely hot: ? Avoid vigorous physical activity. ? Use air conditioning or fans or seek a cooler location. ? Avoid caffeine and alcohol. ? Wear loose-fitting, lightweight, and light-colored clothing.  If the weather is extremely cold: ? Avoid vigorous physical activity. ? Layer your clothes. ? Wear mittens or gloves, a hat, and a scarf when you go outside. ? Avoid alcohol. General instructions  Manage other health conditions such as hypertension, diabetes, thyroid disease, or abnormal heart rhythms as told by your health care provider.  Learn to manage stress. If you need help to do this, ask your health care provider.  Plan rest periods when fatigued.  Get ongoing education and support as needed.  Participate in or seek rehabilitation as needed to maintain or improve independence and quality of life.  Stay up to date with immunizations. Keeping current on pneumococcal and influenza immunizations is especially important to prevent respiratory infections.  Keep all follow-up visits as told by your health care provider. This is important. Contact a health care provider if:  You have a rapid weight gain.  You have increasing shortness of breath that is unusual for you.  You are unable to participate in your usual physical activities.  You tire easily.  You cough more than normal, especially with physical activity.  You have any swelling or more swelling in areas such as your hands, feet, ankles, or abdomen.  You are unable to sleep because it is hard to breathe.  You feel like your heart is beating quickly (palpitations).  You become dizzy or light-headed when you stand up. Get help right away if:  You have difficulty breathing.  You notice or your family notices a change in your awareness, such as having trouble staying awake or having  difficulty with concentration.  You have pain or discomfort in your chest.  You have an episode of fainting (syncope). This information is not intended to replace advice given to you by your health care provider. Make sure you discuss any questions you have with your health care provider. Document Released: 11/10/2005 Document Revised: 07/15/2016 Document Reviewed: 06/04/2016 Elsevier Interactive Patient Education  2018 [employer]  

## 2018-04-08 ENCOUNTER — Ambulatory Visit: Payer: Self-pay | Admitting: General Surgery

## 2018-04-09 ENCOUNTER — Other Ambulatory Visit: Payer: Self-pay | Admitting: Cardiology

## 2018-04-09 DIAGNOSIS — R079 Chest pain, unspecified: Secondary | ICD-10-CM

## 2018-04-13 ENCOUNTER — Encounter (HOSPITAL_COMMUNITY): Payer: Self-pay | Admitting: Emergency Medicine

## 2018-04-13 ENCOUNTER — Other Ambulatory Visit: Payer: Self-pay

## 2018-04-13 ENCOUNTER — Inpatient Hospital Stay (HOSPITAL_COMMUNITY)
Admission: EM | Admit: 2018-04-13 | Discharge: 2018-04-17 | DRG: 920 | Disposition: A | Discharge status: Home Health | Payer: Medicare Other | Attending: Family Medicine | Admitting: Family Medicine

## 2018-04-13 ENCOUNTER — Emergency Department (HOSPITAL_COMMUNITY): Payer: Medicare Other

## 2018-04-13 DIAGNOSIS — K810 Acute cholecystitis: Secondary | ICD-10-CM | POA: Diagnosis present

## 2018-04-13 DIAGNOSIS — N179 Acute kidney failure, unspecified: Secondary | ICD-10-CM | POA: Diagnosis present

## 2018-04-13 DIAGNOSIS — R112 Nausea with vomiting, unspecified: Secondary | ICD-10-CM

## 2018-04-13 DIAGNOSIS — E119 Type 2 diabetes mellitus without complications: Secondary | ICD-10-CM | POA: Diagnosis not present

## 2018-04-13 DIAGNOSIS — E1151 Type 2 diabetes mellitus with diabetic peripheral angiopathy without gangrene: Secondary | ICD-10-CM | POA: Diagnosis present

## 2018-04-13 DIAGNOSIS — Z7982 Long term (current) use of aspirin: Secondary | ICD-10-CM

## 2018-04-13 DIAGNOSIS — I739 Peripheral vascular disease, unspecified: Secondary | ICD-10-CM | POA: Diagnosis present

## 2018-04-13 DIAGNOSIS — E871 Hypo-osmolality and hyponatremia: Secondary | ICD-10-CM | POA: Diagnosis present

## 2018-04-13 DIAGNOSIS — N183 Chronic kidney disease, stage 3 (moderate): Secondary | ICD-10-CM | POA: Diagnosis present

## 2018-04-13 DIAGNOSIS — E1122 Type 2 diabetes mellitus with diabetic chronic kidney disease: Secondary | ICD-10-CM | POA: Diagnosis present

## 2018-04-13 DIAGNOSIS — T85618A Breakdown (mechanical) of other specified internal prosthetic devices, implants and grafts, initial encounter: Secondary | ICD-10-CM | POA: Diagnosis present

## 2018-04-13 DIAGNOSIS — I5032 Chronic diastolic (congestive) heart failure: Secondary | ICD-10-CM | POA: Diagnosis present

## 2018-04-13 DIAGNOSIS — D638 Anemia in other chronic diseases classified elsewhere: Secondary | ICD-10-CM | POA: Diagnosis present

## 2018-04-13 DIAGNOSIS — E1136 Type 2 diabetes mellitus with diabetic cataract: Secondary | ICD-10-CM | POA: Diagnosis present

## 2018-04-13 DIAGNOSIS — Z87891 Personal history of nicotine dependence: Secondary | ICD-10-CM

## 2018-04-13 DIAGNOSIS — I10 Essential (primary) hypertension: Secondary | ICD-10-CM | POA: Diagnosis not present

## 2018-04-13 DIAGNOSIS — Z79899 Other long term (current) drug therapy: Secondary | ICD-10-CM | POA: Diagnosis not present

## 2018-04-13 DIAGNOSIS — R739 Hyperglycemia, unspecified: Secondary | ICD-10-CM | POA: Diagnosis not present

## 2018-04-13 DIAGNOSIS — Y838 Other surgical procedures as the cause of abnormal reaction of the patient, or of later complication, without mention of misadventure at the time of the procedure: Secondary | ICD-10-CM | POA: Diagnosis present

## 2018-04-13 DIAGNOSIS — E1165 Type 2 diabetes mellitus with hyperglycemia: Secondary | ICD-10-CM | POA: Diagnosis present

## 2018-04-13 DIAGNOSIS — I130 Hypertensive heart and chronic kidney disease with heart failure and stage 1 through stage 4 chronic kidney disease, or unspecified chronic kidney disease: Secondary | ICD-10-CM | POA: Diagnosis present

## 2018-04-13 LAB — CBC
HCT: 28.3 % — ABNORMAL LOW (ref 39.0–52.0)
Hemoglobin: 9.5 g/dL — ABNORMAL LOW (ref 13.0–17.0)
MCH: 27.1 pg (ref 26.0–34.0)
MCHC: 33.6 g/dL (ref 30.0–36.0)
MCV: 80.9 fL (ref 78.0–100.0)
Platelets: 283 10*3/uL (ref 150–400)
RBC: 3.5 MIL/uL — ABNORMAL LOW (ref 4.22–5.81)
RDW: 14.7 % (ref 11.5–15.5)
WBC: 13.3 10*3/uL — ABNORMAL HIGH (ref 4.0–10.5)

## 2018-04-13 LAB — HEPATIC FUNCTION PANEL
ALT: 13 U/L — ABNORMAL LOW (ref 17–63)
AST: 19 U/L (ref 15–41)
Albumin: 2.2 g/dL — ABNORMAL LOW (ref 3.5–5.0)
Alkaline Phosphatase: 141 U/L — ABNORMAL HIGH (ref 38–126)
Bilirubin, Direct: 0.1 mg/dL (ref 0.1–0.5)
Indirect Bilirubin: 0.5 mg/dL (ref 0.3–0.9)
Total Bilirubin: 0.6 mg/dL (ref 0.3–1.2)
Total Protein: 7.4 g/dL (ref 6.5–8.1)

## 2018-04-13 LAB — BASIC METABOLIC PANEL
Anion gap: 12 (ref 5–15)
BUN: 22 mg/dL — ABNORMAL HIGH (ref 6–20)
CO2: 22 mmol/L (ref 22–32)
Calcium: 8.9 mg/dL (ref 8.9–10.3)
Chloride: 92 mmol/L — ABNORMAL LOW (ref 101–111)
Creatinine, Ser: 1.99 mg/dL — ABNORMAL HIGH (ref 0.61–1.24)
GFR calc Af Amer: 36 mL/min — ABNORMAL LOW (ref >60)
GFR calc non Af Amer: 31 mL/min — ABNORMAL LOW (ref >60)
Glucose, Bld: 516 mg/dL (ref 65–99)
Potassium: 4.9 mmol/L (ref 3.5–5.1)
Sodium: 126 mmol/L — ABNORMAL LOW (ref 135–145)

## 2018-04-13 LAB — GLUCOSE, CAPILLARY: Glucose-Capillary: 352 mg/dL — ABNORMAL HIGH (ref 65–99)

## 2018-04-13 LAB — URINALYSIS, MICROSCOPIC (REFLEX)

## 2018-04-13 LAB — URINALYSIS, ROUTINE W REFLEX MICROSCOPIC
Bilirubin Urine: NEGATIVE
Glucose, UA: 250 mg/dL — AB
Ketones, ur: NEGATIVE mg/dL
Leukocytes, UA: NEGATIVE
Nitrite: NEGATIVE
Protein, ur: 100 mg/dL — AB
Specific Gravity, Urine: 1.02 (ref 1.005–1.030)
pH: 5.5 (ref 5.0–8.0)

## 2018-04-13 LAB — LIPASE, BLOOD: Lipase: 26 U/L (ref 11–51)

## 2018-04-13 LAB — CBG MONITORING, ED
Glucose-Capillary: 571 mg/dL (ref 65–99)
Glucose-Capillary: 443 mg/dL — ABNORMAL HIGH (ref 65–99)
Glucose-Capillary: 436 mg/dL — ABNORMAL HIGH (ref 65–99)

## 2018-04-13 MED ORDER — PIPERACILLIN-TAZOBACTAM 3.375 G IVPB 30 MIN
3.3750 g | Freq: Once | INTRAVENOUS | Status: AC
  Administered 2018-04-13: 3.375 g via INTRAVENOUS
  Filled 2018-04-13: qty 50

## 2018-04-13 MED ORDER — BRIMONIDINE TARTRATE 0.2 % OP SOLN
1.0000 [drp] | Freq: Two times a day (BID) | OPHTHALMIC | Status: DC
  Administered 2018-04-14 – 2018-04-16 (×7): 1 [drp] via OPHTHALMIC
  Filled 2018-04-13: qty 5

## 2018-04-13 MED ORDER — HYDROCODONE-ACETAMINOPHEN 5-325 MG PO TABS: 1.0000 | ORAL_TABLET | ORAL | Status: DC | PRN

## 2018-04-13 MED ORDER — INSULIN ASPART 100 UNIT/ML ~~LOC~~ SOLN
0.0000 [IU] | SUBCUTANEOUS | Status: DC
  Administered 2018-04-14 (×2): 1 [IU] via SUBCUTANEOUS
  Administered 2018-04-14: 3 [IU] via SUBCUTANEOUS
  Administered 2018-04-14 (×2): 2 [IU] via SUBCUTANEOUS
  Administered 2018-04-14: 7 [IU] via SUBCUTANEOUS
  Administered 2018-04-14 – 2018-04-15 (×2): 1 [IU] via SUBCUTANEOUS
  Administered 2018-04-15: 2 [IU] via SUBCUTANEOUS

## 2018-04-13 MED ORDER — TIMOLOL MALEATE 0.5 % OP SOLN
1.0000 [drp] | Freq: Two times a day (BID) | OPHTHALMIC | Status: DC
  Administered 2018-04-14 – 2018-04-16 (×7): 1 [drp] via OPHTHALMIC
  Filled 2018-04-13: qty 5

## 2018-04-13 MED ORDER — SIMVASTATIN 40 MG PO TABS
40.0000 mg | ORAL_TABLET | Freq: Every day | ORAL | Status: DC
  Administered 2018-04-14 – 2018-04-17 (×4): 40 mg via ORAL
  Filled 2018-04-13 (×4): qty 1

## 2018-04-13 MED ORDER — ONDANSETRON HCL 4 MG/2ML IJ SOLN: 4.0000 mg | Freq: Four times a day (QID) | INTRAMUSCULAR | Status: DC | PRN

## 2018-04-13 MED ORDER — ONDANSETRON HCL 4 MG PO TABS: 4.0000 mg | ORAL_TABLET | Freq: Four times a day (QID) | ORAL | Status: DC | PRN

## 2018-04-13 MED ORDER — SODIUM CHLORIDE 0.9 % IV SOLN
INTRAVENOUS | Status: AC
  Administered 2018-04-13: via INTRAVENOUS

## 2018-04-13 MED ORDER — POLYETHYLENE GLYCOL 3350 17 G PO PACK: 17.0000 g | PACK | Freq: Every day | ORAL | Status: DC | PRN

## 2018-04-13 MED ORDER — SODIUM CHLORIDE 0.9 % IV SOLN: 1.0000 g | Freq: Once | INTRAVENOUS | Status: DC

## 2018-04-13 MED ORDER — INSULIN ASPART 100 UNIT/ML ~~LOC~~ SOLN: 10.0000 [IU] | SUBCUTANEOUS | Status: DC

## 2018-04-13 MED ORDER — PIPERACILLIN-TAZOBACTAM 3.375 G IVPB
3.3750 g | Freq: Three times a day (TID) | INTRAVENOUS | Status: DC
  Administered 2018-04-14 – 2018-04-17 (×10): 3.375 g via INTRAVENOUS
  Filled 2018-04-13 (×9): qty 50

## 2018-04-13 MED ORDER — METOPROLOL SUCCINATE ER 50 MG PO TB24
50.0000 mg | ORAL_TABLET | Freq: Every day | ORAL | Status: DC
  Administered 2018-04-14 – 2018-04-17 (×4): 50 mg via ORAL
  Filled 2018-04-13 (×4): qty 1

## 2018-04-13 MED ORDER — INSULIN ASPART 100 UNIT/ML ~~LOC~~ SOLN
12.0000 [IU] | SUBCUTANEOUS | Status: AC
  Administered 2018-04-13: 12 [IU] via SUBCUTANEOUS

## 2018-04-13 MED ORDER — ONDANSETRON HCL 4 MG/2ML IJ SOLN
4.0000 mg | Freq: Once | INTRAMUSCULAR | Status: AC
  Administered 2018-04-13: 4 mg via INTRAVENOUS
  Filled 2018-04-13: qty 2

## 2018-04-13 MED ORDER — SODIUM CHLORIDE 0.9 % IV BOLUS
1000.0000 mL | Freq: Once | INTRAVENOUS | Status: AC
  Administered 2018-04-13: 1000 mL via INTRAVENOUS

## 2018-04-13 MED ORDER — INSULIN ASPART 100 UNIT/ML ~~LOC~~ SOLN
10.0000 [IU] | SUBCUTANEOUS | Status: AC
Start: 2018-04-13 — End: 2018-04-13
  Administered 2018-04-13: 10 [IU] via SUBCUTANEOUS
  Filled 2018-04-13: qty 1

## 2018-04-13 NOTE — ED Provider Notes (Signed)
[facility] DEPT Provider Note   CSN: [id number] Arrival date & time: 04/13/18  1325     History   Chief Complaint Chief Complaint  Patient presents with  . Nausea  . Emesis  . Hyperglycemia    HPI Jeremiah [name] is a 72 y.o. male.  Jeremiah [name] is a 72 y.o. Male with a history of diabetes, hypertension, peripheral vascular disease, CHF, and cholecystitis with percutaneous cholecystostomy drain in place, who presents to the emergency department for evaluation of hyperglycemia, nausea, vomiting and poor appetite.  Patient reports for the last 2 to 3 days he has been feeling generally unwell, very nauseous with poor appetite and a few episodes of nonbloody nonbilious emesis.  Patient reports some intermittent abdominal pain.  Reports he has a cholecystostomy drain that was put in place in February in the setting of cholecystitis, as patient was not a good candidate for surgery at that time.  Patient has been followed by Dr. Marlou Starks with general surgery.  Patient's wife reports she feels the fluid coming from his drain has become foul-smelling over the past 2 days.  Patient denies any diarrhea, melena or hematochezia.  No chest pain or shortness of breath.  When EMS arrived patient was noted to be hyperglycemic in the 500s, patient reports he does not check his sugar regularly at home, typically takes blood glimepiride for his diabetes is not on insulin or any other medications for this.  Patient reports the past few days he has not been eating or drinking very well, and feels very dehydrated.  Patient is unsure if he has had any fevers.  Patient is not currently on any antibiotics.      Past Medical History:  Diagnosis Date  . Arthritis   . Diabetes mellitus without complication ([place])   . Hypertension   . Peripheral vascular disease [facility])     Patient Active Problem List   Diagnosis Date Noted  . Acute systolic heart failure ([place]) 03/27/2018  .  Diabetes mellitus without complication ([place]) 73/41/9379  . Hypertension 01/30/2018  . Acute cholecystitis 01/30/2018  . AKI (acute kidney injury) ([place]) 01/30/2018  . Normocytic anemia 01/30/2018  . PVD (peripheral vascular disease) ([place]) 09/05/2014    Past Surgical History:  Procedure Laterality Date  . IR CHOLANGIOGRAM EXISTING TUBE  03/17/2018  . IR PERC CHOLECYSTOSTOMY  01/31/2018  . IR RADIOLOGIST EVAL & MGMT  03/02/2018        Home Medications    Prior to Admission medications   Medication Sig Start Date End Date Taking? Authorizing Provider  aspirin 81 MG tablet Take 81 mg by mouth daily.   Yes [provider]  brimonidine (ALPHAGAN) 0.2 % ophthalmic solution Place 1 drop into both eyes 2 (two) times daily.    Yes [provider]  furosemide (LASIX) 40 MG tablet Take 1 tablet (40 mg total) by mouth 2 (two) times daily. 04/01/18 04/01/19 Yes Charolette Forward, MD  glimepiride (AMARYL) 4 MG tablet Take 1 tablet (4 mg total) by mouth daily with breakfast. 02/02/18  Yes Buriev, Arie Sabina, MD  losartan (COZAAR) 100 MG tablet Take 100 mg by mouth daily. 03/01/18  Yes [provider]  metoprolol succinate (TOPROL-XL) 50 MG 24 hr tablet Take 1 tablet (50 mg total) by mouth daily. Take with or immediately following a meal. 04/02/18  Yes Charolette Forward, MD  Naproxen Sodium (ALEVE) 220 MG CAPS Take 440 mg by mouth daily as needed (headache).   Yes  [provider]  potassium chloride SA (K-DUR,KLOR-CON) 20 MEQ tablet Take 1 tablet (20 mEq total) by mouth 2 (two) times daily. 04/01/18  Yes Charolette Forward, MD  simvastatin (ZOCOR) 40 MG tablet Take 40 mg by mouth daily.   Yes [provider]  Sodium Chloride Flush (NORMAL SALINE FLUSH) 0.9 % SOLN Give 10 mLs by tube daily.  03/22/18  Yes [provider]  timolol (TIMOPTIC) 0.5 % ophthalmic solution Place 1 drop into both eyes 2 (two) times daily. 01/27/18  Yes [provider]    Family  History Family History  Problem Relation Age of Onset  . Diabetes Mother   . Hypertension Mother   . Heart disease Father   . Heart attack Father   . Diabetes Sister   . Hypertension Sister   . Diabetes Brother   . Heart disease Brother   . Hypertension Brother   . Heart attack Brother     Social History Social History   Tobacco Use  . Smoking status: Former Smoker    Last attempt to quit: 11/25/1983    Years since quitting: 34.4  . Smokeless tobacco: Never Used  Substance Use Topics  . Alcohol use: No  . Drug use: No     Allergies   Patient has no known allergies.   Review of Systems Review of Systems  Constitutional: Negative for chills and fever.  HENT: Negative for congestion, rhinorrhea and sore throat.   Eyes: Negative for visual disturbance.  Respiratory: Negative for cough and shortness of breath.   Cardiovascular: Negative for chest pain.  Gastrointestinal: Positive for abdominal pain, nausea and vomiting. Negative for blood in stool and diarrhea.  Genitourinary: Negative for dysuria and frequency.  Musculoskeletal: Negative for arthralgias and myalgias.  Skin: Negative for color change and rash.  Neurological: Positive for weakness (generalized). Negative for syncope and light-headedness.     Physical Exam Updated Vital Signs BP (!) 156/66 (BP Location: Left Arm)   Pulse 87   Temp 98.5 F (36.9 C) (Oral)   Resp 18   Ht _0  (1.778 m)   Wt 86.2 kg (190 lb)   SpO2 94%   BMI 27.26 kg/m   Physical Exam  Constitutional: He is oriented to person, place, and time. He appears well-developed and well-nourished. No distress.  HENT:  Head: Normocephalic and atraumatic.  Eyes: Right eye exhibits no discharge. Left eye exhibits no discharge.  Cardiovascular: Normal rate, regular rhythm, normal heart sounds and intact distal pulses.  Pulmonary/Chest: Effort normal and breath sounds normal. No stridor. No respiratory distress. He has no wheezes. He has no  rales.  Respirations equal and unlabored, patient able to speak in full sentences, lungs clear to auscultation bilaterally  Abdominal: Soft. Bowel sounds are normal. He exhibits no distension and no mass. There is tenderness. There is no guarding.  Abdomen soft, nondistended, bowel sounds present throughout, percutaneous cholecystostomy drain pleasant in the right upper quadrant with light green somewhat purulent appearing drainage present in the tube, no surrounding erythema noted, there is mild generalized tenderness, without any focal guarding or peritoneal signs.  Musculoskeletal: He exhibits no edema or deformity.  Neurological: He is alert and oriented to person, place, and time. Coordination normal. GCS eye subscore is 4. GCS verbal subscore is 5. GCS motor subscore is 6.  Moving all extremities without difficulty  Skin: Skin is warm and dry. He is not diaphoretic.  Psychiatric: He has a normal mood and affect. His behavior is normal.  Nursing note and vitals reviewed.    ED Treatments / Results  Labs (all labs ordered are listed, but only abnormal results are displayed) Labs Reviewed  BASIC METABOLIC PANEL - Abnormal; Notable for the following components:      Result Value   Sodium 126 (*)    Chloride 92 (*)    Glucose, Bld 516 (*)    BUN 22 (*)    Creatinine, Ser 1.99 (*)    GFR calc non Af Amer 31 (*)    GFR calc Af Amer 36 (*)    All other components within normal limits  CBC - Abnormal; Notable for the following components:   WBC 13.3 (*)    RBC 3.50 (*)    Hemoglobin 9.5 (*)    HCT 28.3 (*)    All other components within normal limits  HEPATIC FUNCTION PANEL - Abnormal; Notable for the following components:   Albumin 2.2 (*)    ALT 13 (*)    Alkaline Phosphatase 141 (*)    All other components within normal limits  CBG MONITORING, ED - Abnormal; Notable for the following components:   Glucose-Capillary 571 (*)    All other components within normal limits  LIPASE,  BLOOD  URINALYSIS, ROUTINE W REFLEX MICROSCOPIC  CBG MONITORING, ED    EKG None  Radiology Ct Abdomen Pelvis Wo Contrast  Result Date: 04/13/2018 CLINICAL DATA:  Hyperglycemia, nausea, vomiting. EXAM: CT ABDOMEN AND PELVIS WITHOUT CONTRAST TECHNIQUE: Multidetector CT imaging of the abdomen and pelvis was performed following the standard protocol without IV contrast. COMPARISON:  01/30/2018 FINDINGS: Lower chest: Moderate right and small left pleural effusions. Compressive atelectasis in the right lower lobe. Hepatobiliary: Pigtail drainage catheter is in the right upper quadrant the pigtail formed in the gallbladder fossa region. I believe this is likely within the gallbladder lumen, but this is difficult to confirm. The gallbladder is distended with marked wall thickening and surrounding inflammation, both worsening since prior study. At portions, the gallbladder wall is inseparable from the adjacent hepatic flexure of the colon. Pancreas: No focal abnormality or ductal dilatation. Spleen: No focal abnormality.  Normal size. Adrenals/Urinary Tract: Vascular calcifications in the renal arteries and hila bilaterally. No adrenal abnormality. No focal renal abnormality. No stones or hydronephrosis. Urinary bladder is unremarkable. Stomach/Bowel: Stomach, large and small bowel grossly unremarkable. Appendix is normal. Vascular/Lymphatic: Aortic and iliac calcifications. No evidence of aneurysm or adenopathy. Reproductive: No visible focal abnormality. Other: No free fluid or free air. There is a gas and fluid collection noted anterior to the gallbladder, measuring approximately 5 by 1.6 cm which appears to be outside of the gallbladder, likely an abscess along the anterior gallbladder wall. Musculoskeletal: No acute bony abnormality. IMPRESSION: Cholecystostomy tube is in place, presumably within the gallbladder lumen although this cannot be completely confirmed. There is worsening appearance of the  gallbladder since prior CT with worsening wall thickening and surrounding inflammation. Probable extraluminal abscess anterior to the gallbladder fundus. Portions of the gallbladder wall are inseparable from the hepatic flexure of the colon. It is difficult to completely exclude fistula between the gallbladder and the colon. This would be better evaluated with contrast enhanced CT (both oral and IV) if possible. Moderate right pleural effusion and small left pleural effusion. Electronically Signed   By: Rolm Baptise M.D.   On: 04/13/2018 18:36    Procedures Procedures (including critical care time)  Medications Ordered in ED Medications  sodium chloride 0.9 % bolus 1,000 mL (0 mLs Intravenous  Stopped 04/13/18 1720)  ondansetron (ZOFRAN) injection 4 mg (4 mg Intravenous Given 04/13/18 1609)  piperacillin-tazobactam (ZOSYN) IVPB 3.375 g (0 g Intravenous Stopped 04/13/18 2022)  insulin aspart (novoLOG) injection 10 Units (10 Units Subcutaneous Given 04/13/18 2032)     Initial Impression / Assessment and Plan / ED Course  I have reviewed the triage vital signs and the nursing notes.  Pertinent labs & imaging results that were available during my care of the patient were reviewed by me and considered in my medical decision making (see chart for details).  Patient presents for evaluation of hyperglycemia, nausea, vomiting and generalized malaise.  He reports some intermittent abdominal pain, patient has a percutaneous cholecystostomy drain in place in the setting of cholecystitis, reports drainage has become foul-smelling over the past 2 days.  Patient is followed by Dr. Marlou Starks with general surgery for cholecystitis, initial plan was cardiac clearance later this month and surgery on 6/6.  Glucose of 516 on arrival, patient currently taking glimepiride for his diabetes, and does not regularly check his blood sugar at home.  Patient is afebrile with normal vitals and appears to be in no acute distress.   Abdomen with mild generalized tenderness, cystostomy drain in place with somewhat purulent drainage.  Patient does not appear to be in DKA, no anion gap and normal CO2, glucose is 516, sodium is 126, corrected sodium is 133.  Creatinine is 1.99, baseline appears to be between 1.5-1.6.  LFTs show mild elevation in alk phos, otherwise unremarkable, patient does have a leukocytosis of 13.3.  Chronic anemia with hemoglobin of 9.5 which appears to be at baseline.  Will get noncontrasted CT of the abdomen given elevation in creatinine.  CT shows worsening of gallbladder infection with increasing wall thickness and surrounding inflammation, there is a possible extraluminal abscess anterior to the gallbladder, and portions of the gallbladder appear inseparable from the hepatic flexure of the colon.  There is also possibility of fistula between gallbladder and colon.   Worsening infection that is likely responsible for patient's hyperglycemia, will start patient on Zosyn.  General surgery consulted, spoke with Dr. Harlow Asa who feels that patient is still not a good surgical candidate given his numerous other medical problems, recommends continued antibiotics and medicine admission, he will touch base with IR to discuss potentially changing out cholecystostomy drain.  Dr. Harlow Asa will discuss with Dr. Marlou Starks, patient will be seen by surgery service in the morning.  Case discussed with Dr. Denton Brick with [facility] hospitalist who will see and admit the patient.  The patient was discussed with and seen by Dr. Regenia Skeeter who agrees with the treatment plan.   Final Clinical Impressions(s) / ED Diagnoses   Final diagnoses:  Hyperglycemia  Cholecystitis, acute  Non-intractable vomiting with nausea, unspecified vomiting type    ED Discharge Orders    None       Janet Berlin 04/13/18 2336    Sherwood Gambler, MD 04/13/18 [phone]

## 2018-04-13 NOTE — ED Notes (Signed)
ED TO INPATIENT HANDOFF REPORT  Name/Age/Gender Jeremiah [name] 72 y.o. male  Code Status Code Status History    Date Active Date Inactive Code Status Order ID Comments User Context   03/27/2018 1550 04/01/2018 1623 Full Code [id number]  Dixie Dials, MD ED   01/30/2018 2045 02/02/2018 1646 Full Code [id number]  Opyd, Ilene Qua, MD ED      Home/SNF/Other Home  Chief Complaint Emesis; Hyperglycemia   Level of Care/Admitting Diagnosis ED Disposition    ED Disposition Condition Comment   Admit  Hospital Area: [facility] [[id number]]  Level of Care: Med-Surg [16]  Diagnosis: Acute cholecystitis [575.0.ICD-9-CM]  Admitting Physician: Bethena Roys [[id number]]  Attending Physician: Bethena Roys [phone]  Estimated length of stay: past midnight tomorrow  Certification:: I certify this patient will need inpatient services for at least 2 midnights  PT Class (Do Not Modify): Inpatient [101]  PT Acc Code (Do Not Modify): Private [1]       Medical History Past Medical History:  Diagnosis Date  . Arthritis   . Diabetes mellitus without complication ([place])   . Hypertension   . Peripheral vascular disease ([place])     Allergies No Known Allergies  IV Location/Drains/Wounds Patient Lines/Drains/Airways Status   Active Line/Drains/Airways    Name:   Placement date:   Placement time:   Site:   Days:   Peripheral IV 04/13/18 Left Hand   04/13/18    1610    Hand   less than 1   Biliary Tube Cook slip-coat 10.2 Fr. RUQ   01/31/18    1310    RUQ   72   External Urinary Catheter   03/29/18    0416    -   15          Labs/Imaging Results for orders placed or performed during the hospital encounter of 04/13/18 (from the past 48 hour(s))  CBG monitoring, ED     Status: Abnormal   Collection Time: 04/13/18  2:24 PM  Result Value Ref Range   Glucose-Capillary 571 (HH) 65 - 99 mg/dL   Comment 1 Notify RN   Basic metabolic panel     Status: Abnormal   Collection  Time: 04/13/18  3:04 PM  Result Value Ref Range   Sodium 126 (L) 135 - 145 mmol/L   Potassium 4.9 3.5 - 5.1 mmol/L   Chloride 92 (L) 101 - 111 mmol/L   CO2 22 22 - 32 mmol/L   Glucose, Bld 516 (HH) 65 - 99 mg/dL    Comment: CRITICAL RESULT CALLED TO, READ BACK BY AND VERIFIED WITH: WEST,S. RN '@1604'$  ON 05.21.19 BY COHEN,K    BUN 22 (H) 6 - 20 mg/dL   Creatinine, Ser 1.99 (H) 0.61 - 1.24 mg/dL   Calcium 8.9 8.9 - 10.3 mg/dL   GFR calc non Af Amer 31 (L) >60 mL/min   GFR calc Af Amer 36 (L) >60 mL/min    Comment: (NOTE) The eGFR has been calculated using the CKD EPI equation. This calculation has not been validated in all clinical situations. eGFR's persistently <60 mL/min signify possible Chronic Kidney Disease.    Anion gap 12 5 - 15    Comment: Performed at [facility], [place] [address], [place], [place] [address]  CBC     Status: Abnormal   Collection Time: 04/13/18  3:04 PM  Result Value Ref Range   WBC 13.3 (H) 4.0 - 10.5 K/uL   RBC 3.50 (  L) 4.22 - 5.81 MIL/uL   Hemoglobin 9.5 (L) 13.0 - 17.0 g/dL   HCT 28.3 (L) 39.0 - 52.0 %   MCV 80.9 78.0 - 100.0 fL   MCH 27.1 26.0 - 34.0 pg   MCHC 33.6 30.0 - 36.0 g/dL   RDW 14.7 11.5 - 15.5 %   Platelets 283 150 - 400 K/uL    Comment: Performed at [facility], [place] [address] [place], [place] [address]  Hepatic function panel     Status: Abnormal   Collection Time: 04/13/18  3:04 PM  Result Value Ref Range   Total Protein 7.4 6.5 - 8.1 g/dL   Albumin 2.2 (L) 3.5 - 5.0 g/dL   AST 19 15 - 41 U/L   ALT 13 (L) 17 - 63 U/L   Alkaline Phosphatase 141 (H) 38 - 126 U/L   Total Bilirubin 0.6 0.3 - 1.2 mg/dL   Bilirubin, Direct 0.1 0.1 - 0.5 mg/dL   Indirect Bilirubin 0.5 0.3 - 0.9 mg/dL    Comment: Performed at [facility], [place] [address] [place], [place] [address]  Lipase, blood     Status: None   Collection Time: 04/13/18  3:04 PM  Result Value Ref Range   Lipase 26 11 - 51  U/L    Comment: Performed at [facility], [place] [address], [place], [place] [address]  POC CBG, ED     Status: Abnormal   Collection Time: 04/13/18  5:30 PM  Result Value Ref Range   Glucose-Capillary 443 (H) 65 - 99 mg/dL  CBG monitoring, ED     Status: Abnormal   Collection Time: 04/13/18  6:53 PM  Result Value Ref Range   Glucose-Capillary 436 (H) 65 - 99 mg/dL   Ct Abdomen Pelvis Wo Contrast  Result Date: 04/13/2018 CLINICAL DATA:  Hyperglycemia, nausea, vomiting. EXAM: CT ABDOMEN AND PELVIS WITHOUT CONTRAST TECHNIQUE: Multidetector CT imaging of the abdomen and pelvis was performed following the standard protocol without IV contrast. COMPARISON:  01/30/2018 FINDINGS: Lower chest: Moderate right and small left pleural effusions. Compressive atelectasis in the right lower lobe. Hepatobiliary: Pigtail drainage catheter is in the right upper quadrant the pigtail formed in the gallbladder fossa region. I believe this is likely within the gallbladder lumen, but this is difficult to confirm. The gallbladder is distended with marked wall thickening and surrounding inflammation, both worsening since prior study. At portions, the gallbladder wall is inseparable from the adjacent hepatic flexure of the colon. Pancreas: No focal abnormality or ductal dilatation. Spleen: No focal abnormality.  Normal size. Adrenals/Urinary Tract: Vascular calcifications in the renal arteries and hila bilaterally. No adrenal abnormality. No focal renal abnormality. No stones or hydronephrosis. Urinary bladder is unremarkable. Stomach/Bowel: Stomach, large and small bowel grossly unremarkable. Appendix is normal. Vascular/Lymphatic: Aortic and iliac calcifications. No evidence of aneurysm or adenopathy. Reproductive: No visible focal abnormality. Other: No free fluid or free air. There is a gas and fluid collection noted anterior to the gallbladder, measuring approximately 5 by 1.6 cm which appears to be  outside of the gallbladder, likely an abscess along the anterior gallbladder wall. Musculoskeletal: No acute bony abnormality. IMPRESSION: Cholecystostomy tube is in place, presumably within the gallbladder lumen although this cannot be completely confirmed. There is worsening appearance of the gallbladder since prior CT with worsening wall thickening and surrounding inflammation. Probable extraluminal abscess anterior to the gallbladder fundus. Portions of the gallbladder wall are inseparable from the hepatic flexure of the colon.  It is difficult to completely exclude fistula between the gallbladder and the colon. This would be better evaluated with contrast enhanced CT (both oral and IV) if possible. Moderate right pleural effusion and small left pleural effusion. Electronically Signed   By: Rolm Baptise M.D.   On: 04/13/2018 18:36    Pending Labs Unresulted Labs (From admission, onward)   Start     Ordered   04/13/18 1904  Blood culture (routine x 2)  BLOOD CULTURE X 2,   STAT     04/13/18 1903   04/13/18 1422  Urinalysis, Routine w reflex microscopic  Once,   STAT     04/13/18 1422   Signed and Held  CBC  Tomorrow morning,   R     Signed and Held   Signed and Held  Comprehensive metabolic panel  Tomorrow morning,   R     Signed and Held      Vitals/Pain Today's Vitals   04/13/18 1900 04/13/18 2000 04/13/18 2100 04/13/18 2200  BP: (!) 114/97 136/60 125/62 138/77  Pulse: 84 81 91 92  Resp: '18 18 18 18  '$ Temp:      TempSrc:      SpO2: 95% 94% 95% 94%  Weight:      Height:      PainSc:        Isolation Precautions No active isolations  Medications Medications  piperacillin-tazobactam (ZOSYN) IVPB 3.375 g (has no administration in time range)  sodium chloride 0.9 % bolus 1,000 mL (0 mLs Intravenous Stopped 04/13/18 1720)  ondansetron (ZOFRAN) injection 4 mg (4 mg Intravenous Given 04/13/18 1609)  piperacillin-tazobactam (ZOSYN) IVPB 3.375 g (0 g Intravenous Stopped 04/13/18 2022)   insulin aspart (novoLOG) injection 10 Units (10 Units Subcutaneous Given 04/13/18 2032)    Mobility non-ambulatory currently due to weakness

## 2018-04-13 NOTE — ED Triage Notes (Signed)
Patient here via [facility] from home with complaints of hyperglycemia, nausea, vomiting. Drain from gallbladder surgery. AAOx4.

## 2018-04-13 NOTE — H&P (Signed)
History and Physical    Jeremiah [name] VWU:[id number] DOB: [birth year] DOA: 04/13/2018  PCP: Rinaldo Cloud, MD   Patient coming from: Home   Chief Complaint: Drainage from Gall bladder drain, elevated blood sugars  HPI: Jeremiah [name] is a 72 y.o. male with medical history significant for Diastholic CHF, DM, HTN, was brought to the ED from home with complaints of high blood sugars, nausea,. vomiting and foul-smelling discharge from gallbladder drain. Patient reports one episode of vomiting today, with generalized abdominal pain started this morning.  Patient and spouse note change in color and foul smell of drainage from gallbladder drain over the past few days.  Patient denies fever or chills no loose stools. Recent hospital admissions -to cardiologist service Dr. Sharyn Lull- 5/4-04/01/18-for new onset left heart failure. -To hospitalist service- 3/9-3/12/19-for acute cholecystitis, with percutaneous cholecystectomy drain placed by IR 01/31/18, with plan for subsequent elective lap chole.  Cardiology clearance was also recommended.  It was discharged home IV Zosyn was changed to p.o. Augmentin X 7 days.  ED Course: Stable vitals temperature 98.5. WBC- 13.3, hepatic function panel shows mildly elevated ALP- 135, Na- 126, glucose- 516, bicarb-22, anion gap- 12, creatinine elevated 1.9, normal lipase- 26.  CT abdomen and pelvis without contrast-worsening appearance of gallbladder, with wall thickening and surrounding inflammation, probable extraluminal abscess, difficult to exclude fistula between gallbladder and colon.  General surgery consulted recommended hospitalist admission will see in a.m. patient was started on IV Zosyn in the ED. Blood cultures were drawn.  Review of Systems: As per HPI otherwise 10 point review of systems negative.  Past Medical History:  Diagnosis Date  . Arthritis   . Diabetes mellitus without complication (HCC)   . Hypertension   . Peripheral vascular disease [facility])       Past Surgical History:  Procedure Laterality Date  . IR CHOLANGIOGRAM EXISTING TUBE  03/17/2018  . IR PERC CHOLECYSTOSTOMY  01/31/2018  . IR RADIOLOGIST EVAL & MGMT  03/02/2018    reports that he quit smoking about 34 years ago. He has never used smokeless tobacco. He reports that he does not drink alcohol or use drugs.  No Known Allergies  Family History  Problem Relation Age of Onset  . Diabetes Mother   . Hypertension Mother   . Heart disease Father   . Heart attack Father   . Diabetes Sister   . Hypertension Sister   . Diabetes Brother   . Heart disease Brother   . Hypertension Brother   . Heart attack Brother     Prior to Admission medications   Medication Sig Start Date End Date Taking? Authorizing Provider  aspirin 81 MG tablet Take 81 mg by mouth daily.   Yes [provider]  brimonidine (ALPHAGAN) 0.2 % ophthalmic solution Place 1 drop into both eyes 2 (two) times daily.    Yes [provider]  furosemide (LASIX) 40 MG tablet Take 1 tablet (40 mg total) by mouth 2 (two) times daily. 04/01/18 04/01/19 Yes Rinaldo Cloud, MD  glimepiride (AMARYL) 4 MG tablet Take 1 tablet (4 mg total) by mouth daily with breakfast. 02/02/18  Yes Buriev, Isaiah Serge, MD  losartan (COZAAR) 100 MG tablet Take 100 mg by mouth daily. 03/01/18  Yes [provider]  metoprolol succinate (TOPROL-XL) 50 MG 24 hr tablet Take 1 tablet (50 mg total) by mouth daily. Take with or immediately following a meal. 04/02/18  Yes Rinaldo Cloud, MD  Naproxen Sodium (ALEVE) 220 MG CAPS Take 440  mg by mouth daily as needed (headache).   Yes [provider]  potassium chloride SA (K-DUR,KLOR-CON) 20 MEQ tablet Take 1 tablet (20 mEq total) by mouth 2 (two) times daily. 04/01/18  Yes Rinaldo Cloud, MD  simvastatin (ZOCOR) 40 MG tablet Take 40 mg by mouth daily.   Yes [provider]  Sodium Chloride Flush (NORMAL SALINE FLUSH) 0.9 % SOLN Give 10 mLs by tube daily.  03/22/18  Yes  [provider]  timolol (TIMOPTIC) 0.5 % ophthalmic solution Place 1 drop into both eyes 2 (two) times daily. 01/27/18  Yes [provider]    Physical Exam: Vitals:   04/13/18 1700 04/13/18 1730 04/13/18 1900 04/13/18 2000  BP: (!) 156/66 (!) 120/101 (!) 114/97 136/60  Pulse: 87 87 84 81  Resp: Temp:      TempSrc:      SpO2: 94% 96% 95% 94%  Weight:      Height:        Constitutional: NAD, calm, comfortable Vitals:   04/13/18 1700 04/13/18 1730 04/13/18 1900 04/13/18 2000  BP: (!) 156/66 (!) 120/101 (!) 114/97 136/60  Pulse: 87 87 84 81  Resp: Temp:      TempSrc:      SpO2: 94% 96% 95% 94%  Weight:      Height:       Eyes: Fixed irregular shaped pupils- cataract surgery, lids and conjunctivae normal ENMT: Mucous membranes are moist. Posterior pharynx clear of any exudate or lesions.Normal dentition.  Neck: normal, supple, no masses, no thyromegaly Respiratory: clear to auscultation bilaterally, no wheezing, no crackles. Normal respiratory effort. No accessory muscle use.  Cardiovascular: Regular rate and rhythm, no murmurs / rubs / gallops. No extremity edema. 2+ pedal pulses. No carotid bruits.  Abdomen: Mild Diffuse abdominal tenderness, foul-smelling discharge purulent appearing in percutaneous tube, minimal amount in bag, no masses palpated. No hepatosplenomegaly. Bowel sounds positive.  Musculoskeletal: no clubbing / cyanosis. No joint deformity upper and lower extremities. Good ROM, no contractures. Normal muscle tone.  Skin: no rashes, lesions, ulcers. No induration Neurologic: CN 2-12 grossly intact.  Strength 5/5 in all 4.  Psychiatric: Normal judgment and insight. Alert and oriented x 3. Normal mood.   Labs on Admission: I have personally reviewed following labs and imaging studies  CBC: Recent Labs  Lab 04/13/18 1504  WBC 13.3*  HGB 9.5*  HCT 28.3*  MCV 80.9  PLT 283   Basic Metabolic Panel: Recent Labs  Lab  04/13/18 1504  NA 126*  K 4.9  CL 92*  CO2 22  GLUCOSE 516*  BUN 22*  CREATININE 1.99*  CALCIUM 8.9   GFR: Estimated Creatinine Clearance: 33.6 mL/min (A) (by C-G formula based on SCr of 1.99 mg/dL (H)). Liver Function Tests: Recent Labs  Lab 04/13/18 1504  AST 19  ALT 13*  ALKPHOS 141*  BILITOT 0.6  PROT 7.4  ALBUMIN 2.2*   Recent Labs  Lab 04/13/18 1504  LIPASE 26   CBG: Recent Labs  Lab 04/13/18 1424 04/13/18 1730 04/13/18 1853  GLUCAP 571* 443* 436*   Urine analysis:    Component Value Date/Time   COLORURINE YELLOW 01/30/2018 1547   APPEARANCEUR CLEAR 01/30/2018 1547   LABSPEC 1.023 01/30/2018 1547   PHURINE 5.0 01/30/2018 1547   GLUCOSEU >=500 (A) 01/30/2018 1547   HGBUR SMALL (A) 01/30/2018 1547   BILIRUBINUR NEGATIVE 01/30/2018 1547   KETONESUR NEGATIVE 01/30/2018 1547   PROTEINUR  100 (A) 01/30/2018 1547   UROBILINOGEN 0.2 01/09/2009 1532   NITRITE NEGATIVE 01/30/2018 1547   LEUKOCYTESUR NEGATIVE 01/30/2018 1547    Radiological Exams on Admission: Ct Abdomen Pelvis Wo Contrast  Result Date: 04/13/2018 CLINICAL DATA:  Hyperglycemia, nausea, vomiting. EXAM: CT ABDOMEN AND PELVIS WITHOUT CONTRAST TECHNIQUE: Multidetector CT imaging of the abdomen and pelvis was performed following the standard protocol without IV contrast. COMPARISON:  01/30/2018 FINDINGS: Lower chest: Moderate right and small left pleural effusions. Compressive atelectasis in the right lower lobe. Hepatobiliary: Pigtail drainage catheter is in the right upper quadrant the pigtail formed in the gallbladder fossa region. I believe this is likely within the gallbladder lumen, but this is difficult to confirm. The gallbladder is distended with marked wall thickening and surrounding inflammation, both worsening since prior study. At portions, the gallbladder wall is inseparable from the adjacent hepatic flexure of the colon. Pancreas: No focal abnormality or ductal dilatation. Spleen: No focal  abnormality.  Normal size. Adrenals/Urinary Tract: Vascular calcifications in the renal arteries and hila bilaterally. No adrenal abnormality. No focal renal abnormality. No stones or hydronephrosis. Urinary bladder is unremarkable. Stomach/Bowel: Stomach, large and small bowel grossly unremarkable. Appendix is normal. Vascular/Lymphatic: Aortic and iliac calcifications. No evidence of aneurysm or adenopathy. Reproductive: No visible focal abnormality. Other: No free fluid or free air. There is a gas and fluid collection noted anterior to the gallbladder, measuring approximately 5 by 1.6 cm which appears to be outside of the gallbladder, likely an abscess along the anterior gallbladder wall. Musculoskeletal: No acute bony abnormality. IMPRESSION: Cholecystostomy tube is in place, presumably within the gallbladder lumen although this cannot be completely confirmed. There is worsening appearance of the gallbladder since prior CT with worsening wall thickening and surrounding inflammation. Probable extraluminal abscess anterior to the gallbladder fundus. Portions of the gallbladder wall are inseparable from the hepatic flexure of the colon. It is difficult to completely exclude fistula between the gallbladder and the colon. This would be better evaluated with contrast enhanced CT (both oral and IV) if possible. Moderate right pleural effusion and small left pleural effusion. Electronically Signed   By: Charlett Nose M.D.   On: 04/13/2018 18:36    EKG: none   Assessment/Plan Active Problems:   PVD (peripheral vascular disease) (HCC)   Diabetes mellitus without complication (HCC)   Hypertension   Acute cholecystitis   Acute cholecystitis- s/p percutaneous tube placement by IR 01/31/2018. Patient is well-appearing, rules out for sepsis. WBC- 13. Afebrile. Now with abdominal pain, vomiting, purulent discharge, CT scan possible extraluminal abscess, fistula not ruled out. - Cont IV zosyn pharm started in ED -  CBC, CMP a.m - Cardiology consult in a.m- Dr. Lorayne Bender, for surgical clearance - F/u general surg recs - NPO midnight - EKG a.m -Follow blood cultures drawn in ED  AKI-1.9, baseline 1.5-1.6.  On Lasix 40 daily, reports compliance. 1l bolus given in ED -Home Lasix, and losartan will hold off on further IV fluids at this time -BMP a.m.  Chronic congestive heart failure-appears stable.  Creatinine elevated, possibly from volume contraction. 1L bolus given in Ed. and compliant with 40 mg Lasix daily. -Hold home Lasix, and K supplements -Hold off on further IV fluids at this time - Bmp a.m  HTN-stable blood pressure -Continue home metoprolol 50 mg daily -Hold home losartan  DM- blood glucose- 571, last Hgba1c- 8.8 02/01/18. - SSI -Hold home Amaryl while n.p.o.  DVT prophylaxis: Scds Code Status: Full Family Communication: Spouse Phylis at bedside Disposition  Plan: Per rounding team Consults called:  general surgery Admission status: Inpt, med surg   Onnie Boer MD [facility] Pager [phone] From 6PM-2AM.  Otherwise please contact night-coverage [email or web address] Password [facility]  04/13/2018, 9:29 PM

## 2018-04-13 NOTE — Progress Notes (Signed)
Pharmacy Antibiotic Note  Jeremiah [name] is a 72 y.o. male admitted on 04/13/2018 with intra-abdominal infection.  Pharmacy has been consulted for Zosyn dosing.  Plan: Zosyn 3.375g IV Q8H infused over 4hrs.  Follow up renal fxn, culture results, and clinical course. F/u ability to de-escalate antibiotics.   Height:  (177.8 cm) Weight: 190 lb (86.2 kg) IBW/kg (Calculated) : 73  Temp (24hrs), Avg:98.5 F (36.9 C), Min:98.5 F (36.9 C), Max:98.5 F (36.9 C)  Recent Labs  Lab 04/13/18 1504  WBC 13.3*  CREATININE 1.99*    Estimated Creatinine Clearance: 33.6 mL/min (A) (by C-G formula based on SCr of 1.99 mg/dL (H)).    No Known Allergies  Antimicrobials this admission: 5/31 Zosyn >>  Dose adjustments this admission:   Microbiology results: 5/21 BCx:   Thank you for allowing pharmacy to be a part of this patient's care.  Lynann Beaver PharmD, BCPS Pager [phone] 04/13/2018 9:36 PM

## 2018-04-13 NOTE — Progress Notes (Signed)
General Surgery [facility]  Patient known to general surgery service.  Patient last evaluated by Dr. Carolynne Edouard 2 weeks ago.  Patient now being admitted with nausea, vomiting, and hyperglycemia with glucose level greater than 500.  CT scan of abdomen reviewed.  Noncontrast study makes interpretation difficult.  Cholecystostomy tube appears to be in place within the gallbladder.  There is increased inflammatory changes and possible abscess development in the region.  Patient should be admitted to the medical service for management.  He will require intravenous antibiotics.  We will ask interventional radiology to evaluate cholecystostomy tube for possible tube exchange or replacement or additional drain placement.  Operative intervention is not advised at this time.  General surgery will follow with you.  I will notify Dr. Carolynne Edouard of the patient's admission.  Darnell Level, MD [facility] Office: [phone]

## 2018-04-13 NOTE — ED Notes (Signed)
RN notified of abnormal lab 

## 2018-04-13 NOTE — ED Notes (Signed)
Patient aware we need urine sample. Urinal at bedside. Patient receiving IV fluids.

## 2018-04-13 NOTE — ED Notes (Signed)
Pt is aware of the need for a urine sample. He stated he would try.

## 2018-04-13 NOTE — ED Notes (Signed)
[email or web address] did not complete CBG, but testing was completed. Order done.

## 2018-04-14 ENCOUNTER — Encounter (HOSPITAL_COMMUNITY): Payer: Self-pay | Admitting: Interventional Radiology

## 2018-04-14 ENCOUNTER — Inpatient Hospital Stay (HOSPITAL_COMMUNITY): Payer: Medicare Other

## 2018-04-14 DIAGNOSIS — I5032 Chronic diastolic (congestive) heart failure: Secondary | ICD-10-CM

## 2018-04-14 DIAGNOSIS — E119 Type 2 diabetes mellitus without complications: Secondary | ICD-10-CM

## 2018-04-14 DIAGNOSIS — I10 Essential (primary) hypertension: Secondary | ICD-10-CM

## 2018-04-14 DIAGNOSIS — K810 Acute cholecystitis: Secondary | ICD-10-CM

## 2018-04-14 HISTORY — PX: IR CATHETER TUBE CHANGE: IMG717

## 2018-04-14 LAB — COMPREHENSIVE METABOLIC PANEL
ALT: 12 U/L — ABNORMAL LOW (ref 17–63)
AST: 17 U/L (ref 15–41)
Albumin: 2 g/dL — ABNORMAL LOW (ref 3.5–5.0)
Alkaline Phosphatase: 131 U/L — ABNORMAL HIGH (ref 38–126)
Anion gap: 10 (ref 5–15)
BUN: 20 mg/dL (ref 6–20)
CO2: 23 mmol/L (ref 22–32)
Calcium: 8.8 mg/dL — ABNORMAL LOW (ref 8.9–10.3)
Chloride: 99 mmol/L — ABNORMAL LOW (ref 101–111)
Creatinine, Ser: 1.82 mg/dL — ABNORMAL HIGH (ref 0.61–1.24)
GFR calc Af Amer: 40 mL/min — ABNORMAL LOW (ref >60)
GFR calc non Af Amer: 35 mL/min — ABNORMAL LOW (ref >60)
Glucose, Bld: 139 mg/dL — ABNORMAL HIGH (ref 65–99)
Potassium: 4.2 mmol/L (ref 3.5–5.1)
Sodium: 132 mmol/L — ABNORMAL LOW (ref 135–145)
Total Bilirubin: 0.4 mg/dL (ref 0.3–1.2)
Total Protein: 6.8 g/dL (ref 6.5–8.1)

## 2018-04-14 LAB — GLUCOSE, CAPILLARY
Glucose-Capillary: 138 mg/dL — ABNORMAL HIGH (ref 65–99)
Glucose-Capillary: 141 mg/dL — ABNORMAL HIGH (ref 65–99)
Glucose-Capillary: 143 mg/dL — ABNORMAL HIGH (ref 65–99)
Glucose-Capillary: 176 mg/dL — ABNORMAL HIGH (ref 65–99)
Glucose-Capillary: 181 mg/dL — ABNORMAL HIGH (ref 65–99)
Glucose-Capillary: 206 mg/dL — ABNORMAL HIGH (ref 65–99)
Glucose-Capillary: 310 mg/dL — ABNORMAL HIGH (ref 65–99)

## 2018-04-14 LAB — CBC
HCT: 25.9 % — ABNORMAL LOW (ref 39.0–52.0)
Hemoglobin: 8.5 g/dL — ABNORMAL LOW (ref 13.0–17.0)
MCH: 26.5 pg (ref 26.0–34.0)
MCHC: 32.8 g/dL (ref 30.0–36.0)
MCV: 80.7 fL (ref 78.0–100.0)
Platelets: 262 10*3/uL (ref 150–400)
RBC: 3.21 MIL/uL — ABNORMAL LOW (ref 4.22–5.81)
RDW: 14.6 % (ref 11.5–15.5)
WBC: 11.3 10*3/uL — ABNORMAL HIGH (ref 4.0–10.5)

## 2018-04-14 MED ORDER — LIDOCAINE HCL 1 % IJ SOLN
INTRAMUSCULAR | Status: AC
  Filled 2018-04-14: qty 20

## 2018-04-14 MED ORDER — ACETAMINOPHEN 325 MG PO TABS
650.0000 mg | ORAL_TABLET | Freq: Four times a day (QID) | ORAL | Status: DC | PRN
  Administered 2018-04-14 – 2018-04-16 (×4): 650 mg via ORAL
  Filled 2018-04-14 (×4): qty 2

## 2018-04-14 MED ORDER — SODIUM CHLORIDE 0.9% FLUSH
5.0000 mL | Freq: Three times a day (TID) | INTRAVENOUS | Status: DC
  Administered 2018-04-14 – 2018-04-17 (×7): 5 mL

## 2018-04-14 MED ORDER — IOPAMIDOL (ISOVUE-300) INJECTION 61%
50.0000 mL | Freq: Once | INTRAVENOUS | Status: AC | PRN
  Administered 2018-04-14: 15 mL

## 2018-04-14 MED ORDER — IOPAMIDOL (ISOVUE-300) INJECTION 61%
INTRAVENOUS | Status: AC
  Administered 2018-04-14: 15 mL
  Filled 2018-04-14: qty 50

## 2018-04-14 NOTE — Plan of Care (Signed)
Pt alert and oriented, resting with no complaints of pain.  Fever of 101F this am, Tresa Endo PA made aware, tylenol given.  Taken to IR for drain replacement.

## 2018-04-14 NOTE — Progress Notes (Signed)
Patient ID: Jeremiah [name], male   DOB: [birth year], 72 y.o.   MRN: [id number]       Subjective: Having some RUQ abdominal pain, but not much.  Doesn't feel great.  Objective: Vital signs in last 24 hours: Temp:  [98.5 F (36.9 C)-101.5 F (38.6 C)] 99.8 F (37.7 C) (05/22 0504) Pulse Rate:  [80-92] 86 (05/22 0504) Resp:  [18] 18 (05/22 0504) BP: (107-156)/(49-101) 107/53 (05/22 0504) SpO2:  [93 %-100 %] 95 % (05/22 0504) Weight:  [81.4 kg (179 lb 7.3 oz)-86.2 kg (190 lb)] 81.4 kg (179 lb 7.3 oz) (05/22 0500) Last BM Date: 04/13/18  Intake/Output from previous day: 05/21 0701 - 05/22 0700 In: 1402.5 [I.V.:302.5; IV Piggyback:1100] Out: 600 [Urine:600] Intake/Output this shift: No intake/output data recorded.  PE: Heart: regular, +murmur Lungs: CTAB Abd: soft, tender in RUQ, drain with foul smelling, purulent output, +BS, ND  Lab Results:  Recent Labs    04/13/18 1504 04/14/18 0417  WBC 13.3* 11.3*  HGB 9.5* 8.5*  HCT 28.3* 25.9*  PLT 283 262   BMET Recent Labs    04/13/18 1504 04/14/18 0417  NA 126* 132*  K 4.9 4.2  CL 92* 99*  CO2 22 23  GLUCOSE 516* 139*  BUN 22* 20  CREATININE 1.99* 1.82*  CALCIUM 8.9 8.8*   PT/INR No results for input(s): LABPROT, INR in the last 72 hours. CMP     Component Value Date/Time   NA 132 (L) 04/14/2018 0417   K 4.2 04/14/2018 0417   CL 99 (L) 04/14/2018 0417   CO2 23 04/14/2018 0417   GLUCOSE 139 (H) 04/14/2018 0417   BUN 20 04/14/2018 0417   CREATININE 1.82 (H) 04/14/2018 0417   CALCIUM 8.8 (L) 04/14/2018 0417   PROT 6.8 04/14/2018 0417   ALBUMIN 2.0 (L) 04/14/2018 0417   AST 17 04/14/2018 0417   ALT 12 (L) 04/14/2018 0417   ALKPHOS 131 (H) 04/14/2018 0417   BILITOT 0.4 04/14/2018 0417   GFRNONAA 35 (L) 04/14/2018 0417   GFRAA 40 (L) 04/14/2018 0417   Lipase     Component Value Date/Time   LIPASE 26 04/13/2018 1504       Studies/Results: Ct Abdomen Pelvis Wo Contrast  Result Date:  04/13/2018 CLINICAL DATA:  Hyperglycemia, nausea, vomiting. EXAM: CT ABDOMEN AND PELVIS WITHOUT CONTRAST TECHNIQUE: Multidetector CT imaging of the abdomen and pelvis was performed following the standard protocol without IV contrast. COMPARISON:  01/30/2018 FINDINGS: Lower chest: Moderate right and small left pleural effusions. Compressive atelectasis in the right lower lobe. Hepatobiliary: Pigtail drainage catheter is in the right upper quadrant the pigtail formed in the gallbladder fossa region. I believe this is likely within the gallbladder lumen, but this is difficult to confirm. The gallbladder is distended with marked wall thickening and surrounding inflammation, both worsening since prior study. At portions, the gallbladder wall is inseparable from the adjacent hepatic flexure of the colon. Pancreas: No focal abnormality or ductal dilatation. Spleen: No focal abnormality.  Normal size. Adrenals/Urinary Tract: Vascular calcifications in the renal arteries and hila bilaterally. No adrenal abnormality. No focal renal abnormality. No stones or hydronephrosis. Urinary bladder is unremarkable. Stomach/Bowel: Stomach, large and small bowel grossly unremarkable. Appendix is normal. Vascular/Lymphatic: Aortic and iliac calcifications. No evidence of aneurysm or adenopathy. Reproductive: No visible focal abnormality. Other: No free fluid or free air. There is a gas and fluid collection noted anterior to the gallbladder, measuring approximately 5 by 1.6 cm which appears to be outside of  the gallbladder, likely an abscess along the anterior gallbladder wall. Musculoskeletal: No acute bony abnormality. IMPRESSION: Cholecystostomy tube is in place, presumably within the gallbladder lumen although this cannot be completely confirmed. There is worsening appearance of the gallbladder since prior CT with worsening wall thickening and surrounding inflammation. Probable extraluminal abscess anterior to the gallbladder fundus.  Portions of the gallbladder wall are inseparable from the hepatic flexure of the colon. It is difficult to completely exclude fistula between the gallbladder and the colon. This would be better evaluated with contrast enhanced CT (both oral and IV) if possible. Moderate right pleural effusion and small left pleural effusion. Electronically Signed   By: Charlett Nose M.D.   On: 04/13/2018 18:36    Anti-infectives: Anti-infectives (From admission, onward)   Start     Dose/Rate Route Frequency Ordered Stop   04/14/18 0400  piperacillin-tazobactam (ZOSYN) IVPB 3.375 g     3.375 g 12.5 mL/hr over 240 Minutes Intravenous Every 8 hours 04/13/18 2140     04/13/18 1915  ceFEPIme (MAXIPIME) 1 g in sodium chloride 0.9 % 100 mL IVPB  Status:  Discontinued     1 g 200 mL/hr over 30 Minutes Intravenous  Once 04/13/18 1903 04/13/18 1908   04/13/18 1915  piperacillin-tazobactam (ZOSYN) IVPB 3.375 g     3.375 g 100 mL/hr over 30 Minutes Intravenous  Once 04/13/18 1909 04/13/18 2022       Assessment/Plan Acute cholecystitis, s/p perc chole drain 01-31-18 -patient was cleared by cardiology for cholecystectomy recently with plans for surgery in a couple of weeks.  Patient began noticing purulent drainage from his bag with fevers of 101 and over. -CT scan reviewed.  He has air in the gallbladder fossa, ? Necrotic gallbladder, necrotic liver, abscess.  His gb wall is thickened.  A fistula can not be ruled out between the gallbladder and the colon, but this is felt much less likely clinically as there is no evidence of stool in his drain, just purulence. -recommend IR drain injection today to verify that the drain is in the gallbladder and patent, as well as to rule out any possible fistula, etc. -also appears to be a small "abscess" on CT that is mostly air in front of the gallbladder.  Doubt much can be done about this, unless IR felt differently. -for now, surgery is on hold as this new infection and appearance  on his scan would put him at very high surgical risk for complications.   -cont abx therapy -will follow  HTN DM PVD Diastolic CHF   FEN - NPO/IVFs VTE - SCDs, can have chemical prophylaxis from our standpoint ID - Zosyn   LOS: 1 day    Letha Cape , [facility] 04/14/2018, 8:27 AM Pager: [phone]

## 2018-04-14 NOTE — Progress Notes (Signed)
PROGRESS NOTE    Jeremiah [name]  ZOX:[id number] DOB: 1946-09-07 DOA: 04/13/2018 PCP: Jeremiah Cloud, MD      Brief Narrative:  Jeremiah [name] is a 72 y.o. M with HTN, DM, chronic diastolic CHF, CKD III, and peripheral vascular disease   Assessment & Plan:  Cholecystitis Had perc tube replaced today by IR, feels better after. -Consult Gen Surg -Consult IR -Continue Zosyn for now -Follow blood cultures, unfortunately no cultures obtained from drain or repeat perc tube   Hypertension Peripheral vascular disease secondary prevention -Continue statin, metoprolol -Hold home ARB  Chronic diastolic CHF Recent echo showed EF 50-55%, grade I DD. -Hold home ARB and furosemide until taking p.o. Better  AKI on CKD III Baseline Cr 1.4-1.7.  Up 1.99 with admission, from pre-renal injury.   -Continue IV fluids for now -Trend BMP  Diabetes Diet controlled, very hyperglycemic at admission, now resolved -SSI with meals  Hyponatremia Mild, improving today to 132.  Partly from glucose. -Monitor BMP  Anemia From chronic disease, mild -Trend CBC     DVT prophylaxis: SCDs Code Status: FULL Family Communication: Wife at bedside MDM and disposition Plan: The below labs and imaging reports were reviewed and summarized above.  The patient's status is clinically improving.  He was admitted with nausea vomiting and fever from failure of his percutaneous cholecystostomy tube.  Now replaced, improving.   Consultants:   Gen Surg  IR  Procedures:   Perc tube replacement When the existing percutaneous drainage catheter was un bandaged, it was identified to be kinked several cm from the skin insertion site. The tube was unkinked before sterile prep and drape. A hand injection of contrast material was then performed. The gallbladder lumen is diffusely irregular. Several outpouchings are present within the gallbladder wall consistent with areas of gallbladder wall breakdown. The  cystic duct is patent. Contrast material enters the duodenum.  Successful exchange and up size to a 12 [place] cook all-purpose drainage catheter. The catheter is formed with the locking loop in the gallbladder lumen. Aspiration yields approximately 20 mL of foul-smelling purulent material. The catheter was secured to the skin with an adhesive and suture based fixation device.  IMPRESSION: 1. The existing 10 French percutaneous cholecystostomy tube was kinked. 2. Successful exchange for a new 12 French percutaneous cholecystostomy tube.   Antimicrobials:   Zosyn 5/21 >>    Subjective: Feeling better.  Had some appetite after the procedure.  No new fever since, no vomiting.  No confusion, weakness.  Abdominal pain improving.  Objective: Vitals:   04/14/18 1112 04/14/18 1515 04/14/18 1641 04/14/18 1659  BP: (!) 106/51 (!) 125/49  (!) 120/54  Pulse: 80 86  84  Resp: Temp: 99.4 F (37.4 C) (!) 101.2 F (38.4 C) (!) 101.1 F (38.4 C) 99.3 F (37.4 C)  TempSrc: Oral Oral Oral Oral  SpO2: 93% 93%  94%  Weight:      Height:        Intake/Output Summary (Last 24 hours) at 04/14/2018 1713 Last data filed at 04/14/2018 1651 Gross per 24 hour  Intake 1834.17 ml  Output 950 ml  Net 884.17 ml   Filed Weights   04/13/18 1425 04/14/18 0500  Weight: 86.2 kg (190 lb) 81.4 kg (179 lb 7.3 oz)    Examination: General appearance: Elderly adult male, alert and in no acute distress.  Sl.eeping, easily rousable HEENT: Anicteric, conjunctiva pink, lids and lashes normal. No nasal deformity, discharge, epistaxis.  Lips moist.  No oral lesions, OP moist, hearing normal.   Skin: Warm and dry.  No suspicious rashes or lesions. Cardiac: RRR, nl S1-S2, no murmurs appreciated.  Capillary refill is brisk.  No LE edema. Respiratory: Normal respiratory rate and rhythm.  CTAB without rales or wheezes. Abdomen: Abdomen soft.  Mild nonofocal TTP without gaurding. No ascites, distension,  hepatosplenomegaly.   MSK: No deformities or effusions. Neuro: Awake and alert.  EOMI, moves all extremities. Speech fluent.    Psych: Sensorium intact and responding to questions, attention sleepy.  Affect normal.  Judgment and insight appear normal.    Data Reviewed: I have personally reviewed following labs and imaging studies:  CBC: Recent Labs  Lab 04/13/18 1504 04/14/18 0417  WBC 13.3* 11.3*  HGB 9.5* 8.5*  HCT 28.3* 25.9*  MCV 80.9 80.7  PLT 283 262   Basic Metabolic Panel: Recent Labs  Lab 04/13/18 1504 04/14/18 0417  NA 126* 132*  K 4.9 4.2  CL 92* 99*  CO2 22 23  GLUCOSE 516* 139*  BUN 22* 20  CREATININE 1.99* 1.82*  CALCIUM 8.9 8.8*   GFR: Estimated Creatinine Clearance: 36.8 mL/min (A) (by C-G formula based on SCr of 1.82 mg/dL (H)). Liver Function Tests: Recent Labs  Lab 04/13/18 1504 04/14/18 0417  AST 19 17  ALT 13* 12*  ALKPHOS 141* 131*  BILITOT 0.6 0.4  PROT 7.4 6.8  ALBUMIN 2.2* 2.0*   Recent Labs  Lab 04/13/18 1504  LIPASE 26   No results for input(s): AMMONIA in the last 168 hours. Coagulation Profile: No results for input(s): INR, PROTIME in the last 168 hours. Cardiac Enzymes: No results for input(s): CKTOTAL, CKMB, CKMBINDEX, TROPONINI in the last 168 hours. BNP (last 3 results) No results for input(s): PROBNP in the last 8760 hours. HbA1C: No results for input(s): HGBA1C in the last 72 hours. CBG: Recent Labs  Lab 04/14/18 0026 04/14/18 0455 04/14/18 0729 04/14/18 1235 04/14/18 1643  GLUCAP 310* 141* 138* 143* 206*   Lipid Profile: No results for input(s): CHOL, HDL, LDLCALC, TRIG, CHOLHDL, LDLDIRECT in the last 72 hours. Thyroid Function Tests: No results for input(s): TSH, T4TOTAL, FREET4, T3FREE, THYROIDAB in the last 72 hours. Anemia Panel: No results for input(s): VITAMINB12, FOLATE, FERRITIN, TIBC, IRON, RETICCTPCT in the last 72 hours. Urine analysis:    Component Value Date/Time   COLORURINE YELLOW  04/13/2018 1504   APPEARANCEUR CLEAR 04/13/2018 1504   LABSPEC 1.020 04/13/2018 1504   PHURINE 5.5 04/13/2018 1504   GLUCOSEU 250 (A) 04/13/2018 1504   HGBUR MODERATE (A) 04/13/2018 1504   BILIRUBINUR NEGATIVE 04/13/2018 1504   KETONESUR NEGATIVE 04/13/2018 1504   PROTEINUR 100 (A) 04/13/2018 1504   UROBILINOGEN 0.2 01/09/2009 1532   NITRITE NEGATIVE 04/13/2018 1504   LEUKOCYTESUR NEGATIVE 04/13/2018 1504   Sepsis Labs: (procalcitonin:4,lacticacidven:4)  ) Recent Results (from the past 240 hour(s))  Blood culture (routine x 2)     Status: None (Preliminary result)   Collection Time: 04/13/18  7:48 PM  Result Value Ref Range Status   Specimen Description   Final    BLOOD RIGHT WRIST Performed at [facility], [address], [place], [place] [address]    Special Requests   Final    BOTTLES DRAWN AEROBIC AND ANAEROBIC Blood Culture adequate volume Performed at [facility], [address], [place], [place] [address]    Culture   Final    NO GROWTH < 24 HOURS Performed at [facility],  [address], [place], [place] [address]    Report Status PENDING  Incomplete  Blood culture (routine x 2)     Status: None (Preliminary result)   Collection Time: 04/13/18  7:49 PM  Result Value Ref Range Status   Specimen Description   Final    BLOOD RIGHT HAND Performed at [facility], [address], [place], [place] [address]    Special Requests   Final    BOTTLES DRAWN AEROBIC AND ANAEROBIC Blood Culture results may not be optimal due to an excessive volume of blood received in culture bottles Performed at [facility], [address] [place], [place] [address]    Culture   Final    NO GROWTH < 24 HOURS Performed at [facility], [address] [place], [place] [address]    Report Status PENDING  Incomplete         Radiology Studies: Ct Abdomen Pelvis Wo Contrast  Result  Date: 04/13/2018 CLINICAL DATA:  Hyperglycemia, nausea, vomiting. EXAM: CT ABDOMEN AND PELVIS WITHOUT CONTRAST TECHNIQUE: Multidetector CT imaging of the abdomen and pelvis was performed following the standard protocol without IV contrast. COMPARISON:  01/30/2018 FINDINGS: Lower chest: Moderate right and small left pleural effusions. Compressive atelectasis in the right lower lobe. Hepatobiliary: Pigtail drainage catheter is in the right upper quadrant the pigtail formed in the gallbladder fossa region. I believe this is likely within the gallbladder lumen, but this is difficult to confirm. The gallbladder is distended with marked wall thickening and surrounding inflammation, both worsening since prior study. At portions, the gallbladder wall is inseparable from the adjacent hepatic flexure of the colon. Pancreas: No focal abnormality or ductal dilatation. Spleen: No focal abnormality.  Normal size. Adrenals/Urinary Tract: Vascular calcifications in the renal arteries and hila bilaterally. No adrenal abnormality. No focal renal abnormality. No stones or hydronephrosis. Urinary bladder is unremarkable. Stomach/Bowel: Stomach, large and small bowel grossly unremarkable. Appendix is normal. Vascular/Lymphatic: Aortic and iliac calcifications. No evidence of aneurysm or adenopathy. Reproductive: No visible focal abnormality. Other: No free fluid or free air. There is a gas and fluid collection noted anterior to the gallbladder, measuring approximately 5 by 1.6 cm which appears to be outside of the gallbladder, likely an abscess along the anterior gallbladder wall. Musculoskeletal: No acute bony abnormality. IMPRESSION: Cholecystostomy tube is in place, presumably within the gallbladder lumen although this cannot be completely confirmed. There is worsening appearance of the gallbladder since prior CT with worsening wall thickening and surrounding inflammation. Probable extraluminal abscess anterior to the gallbladder  fundus. Portions of the gallbladder wall are inseparable from the hepatic flexure of the colon. It is difficult to completely exclude fistula between the gallbladder and the colon. This would be better evaluated with contrast enhanced CT (both oral and IV) if possible. Moderate right pleural effusion and small left pleural effusion. Electronically Signed   By: Charlett Nose M.D.   On: 04/13/2018 18:36   Ir Catheter Tube Change  Result Date: 04/14/2018 INDICATION: 72 year old male with a history of acute cholecystitis. He was a poor operative candidate and had a percutaneous cholecystostomy tube placed on 01/31/2018. He recently presented with increasing abdominal pain and fever. CT imaging demonstrates progression of acute cholecystitis with tracking intra-abdominal infection. He presents for tube evaluation. EXAM: IR CATHETER TUBE CHANGE MEDICATIONS: None ANESTHESIA/SEDATION: None FLUOROSCOPY TIME:  Fluoroscopy Time: 3 minutes 12 seconds (41.7 mGy). COMPLICATIONS: None immediate. PROCEDURE: Informed written consent was obtained from the patient after  a thorough discussion of the procedural risks, benefits and alternatives. All questions were addressed. Maximal Sterile Barrier Technique was utilized including caps, mask, sterile gowns, sterile gloves, sterile drape, hand hygiene and skin antiseptic. A timeout was performed prior to the initiation of the procedure. When the existing percutaneous drainage catheter was un bandaged, it was identified to be kinked several cm from the skin insertion site. The tube was unkinked before sterile prep and drape. A hand injection of contrast material was then performed. The gallbladder lumen is diffusely irregular. Several outpouchings are present within the gallbladder wall consistent with areas of gallbladder wall breakdown. The cystic duct is patent. Contrast material enters the duodenum. Successful exchange and up size to a 12 [place] cook all-purpose drainage catheter.  The catheter is formed with the locking loop in the gallbladder lumen. Aspiration yields approximately 20 mL of foul-smelling purulent material. The catheter was secured to the skin with an adhesive and suture based fixation device. IMPRESSION: 1. The existing 10 French percutaneous cholecystostomy tube was kinked. 2. Successful exchange for a new 12 French percutaneous cholecystostomy tube. Electronically Signed   By: Malachy Moan M.D.   On: 04/14/2018 11:28        Scheduled Meds: . brimonidine  1 drop Both Eyes BID  . insulin aspart  0-9 Units Subcutaneous Q4H  . lidocaine      . metoprolol succinate  50 mg Oral Daily  . simvastatin  40 mg Oral Daily  . sodium chloride flush  5 mL Intracatheter Q8H  . timolol  1 drop Both Eyes BID   Continuous Infusions: . piperacillin-tazobactam (ZOSYN)  IV Stopped (04/14/18 1648)     LOS: 1 day    Time spent: 25 minutes    Alberteen Sam, MD [facility] 04/14/2018, 5:13 PM     Pager [phone] --- please page though AMION:  [email or web address] If 7PM-7AM, please contact night-coverage

## 2018-04-15 ENCOUNTER — Other Ambulatory Visit: Payer: Self-pay

## 2018-04-15 LAB — BASIC METABOLIC PANEL
Anion gap: 11 (ref 5–15)
BUN: 23 mg/dL — ABNORMAL HIGH (ref 6–20)
CO2: 25 mmol/L (ref 22–32)
Calcium: 8.9 mg/dL (ref 8.9–10.3)
Chloride: 98 mmol/L — ABNORMAL LOW (ref 101–111)
Creatinine, Ser: 1.84 mg/dL — ABNORMAL HIGH (ref 0.61–1.24)
GFR calc Af Amer: 40 mL/min — ABNORMAL LOW (ref >60)
GFR calc non Af Amer: 34 mL/min — ABNORMAL LOW (ref >60)
Glucose, Bld: 160 mg/dL — ABNORMAL HIGH (ref 65–99)
Potassium: 4.6 mmol/L (ref 3.5–5.1)
Sodium: 134 mmol/L — ABNORMAL LOW (ref 135–145)

## 2018-04-15 LAB — CBC
HCT: 25.8 % — ABNORMAL LOW (ref 39.0–52.0)
Hemoglobin: 8.3 g/dL — ABNORMAL LOW (ref 13.0–17.0)
MCH: 26.1 pg (ref 26.0–34.0)
MCHC: 32.2 g/dL (ref 30.0–36.0)
MCV: 81.1 fL (ref 78.0–100.0)
Platelets: 272 10*3/uL (ref 150–400)
RBC: 3.18 MIL/uL — ABNORMAL LOW (ref 4.22–5.81)
RDW: 15 % (ref 11.5–15.5)
WBC: 10.2 10*3/uL (ref 4.0–10.5)

## 2018-04-15 LAB — GLUCOSE, CAPILLARY
Glucose-Capillary: 179 mg/dL — ABNORMAL HIGH (ref 65–99)
Glucose-Capillary: 136 mg/dL — ABNORMAL HIGH (ref 65–99)
Glucose-Capillary: 226 mg/dL — ABNORMAL HIGH (ref 65–99)
Glucose-Capillary: 193 mg/dL — ABNORMAL HIGH (ref 65–99)
Glucose-Capillary: 152 mg/dL — ABNORMAL HIGH (ref 65–99)

## 2018-04-15 MED ORDER — INSULIN ASPART 100 UNIT/ML ~~LOC~~ SOLN
0.0000 [IU] | Freq: Three times a day (TID) | SUBCUTANEOUS | Status: DC
  Administered 2018-04-15: 3 [IU] via SUBCUTANEOUS
  Administered 2018-04-15: 5 [IU] via SUBCUTANEOUS
  Administered 2018-04-16: 2 [IU] via SUBCUTANEOUS
  Administered 2018-04-16: 3 [IU] via SUBCUTANEOUS
  Administered 2018-04-17: 2 [IU] via SUBCUTANEOUS

## 2018-04-15 MED ORDER — ENOXAPARIN SODIUM 30 MG/0.3ML ~~LOC~~ SOLN
30.0000 mg | SUBCUTANEOUS | Status: DC
  Administered 2018-04-15 – 2018-04-16 (×2): 30 mg via SUBCUTANEOUS
  Filled 2018-04-15 (×2): qty 0.3

## 2018-04-15 MED ORDER — INSULIN ASPART 100 UNIT/ML ~~LOC~~ SOLN: 0.0000 [IU] | Freq: Every day | SUBCUTANEOUS | Status: DC

## 2018-04-15 NOTE — Progress Notes (Signed)
Referring Physician(s): Dr. Maisie Fus  Supervising Physician: Simonne Come  Patient Status:  [facility] - In-pt  Chief Complaint: Acute cholecystitis  Subjective: States he is feeling better.  Eating with tolerance. Does not believe he has had any drain output since exchange.   Allergies: Patient has no known allergies.  Medications: Prior to Admission medications   Medication Sig Start Date End Date Taking? Authorizing Provider  aspirin 81 MG tablet Take 81 mg by mouth daily.   Yes [provider]  brimonidine (ALPHAGAN) 0.2 % ophthalmic solution Place 1 drop into both eyes 2 (two) times daily.    Yes [provider]  furosemide (LASIX) 40 MG tablet Take 1 tablet (40 mg total) by mouth 2 (two) times daily. 04/01/18 04/01/19 Yes Rinaldo Cloud, MD  glimepiride (AMARYL) 4 MG tablet Take 1 tablet (4 mg total) by mouth daily with breakfast. 02/02/18  Yes Buriev, Isaiah Serge, MD  losartan (COZAAR) 100 MG tablet Take 100 mg by mouth daily. 03/01/18  Yes [provider]  metoprolol succinate (TOPROL-XL) 50 MG 24 hr tablet Take 1 tablet (50 mg total) by mouth daily. Take with or immediately following a meal. 04/02/18  Yes Rinaldo Cloud, MD  Naproxen Sodium (ALEVE) 220 MG CAPS Take 440 mg by mouth daily as needed (headache).   Yes [provider]  potassium chloride SA (K-DUR,KLOR-CON) 20 MEQ tablet Take 1 tablet (20 mEq total) by mouth 2 (two) times daily. 04/01/18  Yes Rinaldo Cloud, MD  simvastatin (ZOCOR) 40 MG tablet Take 40 mg by mouth daily.   Yes [provider]  Sodium Chloride Flush (NORMAL SALINE FLUSH) 0.9 % SOLN Give 10 mLs by tube daily.  03/22/18  Yes [provider]  timolol (TIMOPTIC) 0.5 % ophthalmic solution Place 1 drop into both eyes 2 (two) times daily. 01/27/18  Yes [provider]     Vital Signs: BP (!) 120/52   Pulse 78   Temp 98.8 F (37.1 C) (Oral)   Resp 16   Ht  (1.778 m)   Wt 179 lb 7.3 oz (81.4 kg)    SpO2 95%   BMI 25.75 kg/m   Physical Exam  Nursing note and vitals reviewed. Abdomen:  Cholecystostomy in place.  Insertion site intact.  Very small amount of thin, purulent-appearing fluid in drainage catheter.  No accumulated fluid in collection bag.   Imaging: Ct Abdomen Pelvis Wo Contrast  Result Date: 04/13/2018 CLINICAL DATA:  Hyperglycemia, nausea, vomiting. EXAM: CT ABDOMEN AND PELVIS WITHOUT CONTRAST TECHNIQUE: Multidetector CT imaging of the abdomen and pelvis was performed following the standard protocol without IV contrast. COMPARISON:  01/30/2018 FINDINGS: Lower chest: Moderate right and small left pleural effusions. Compressive atelectasis in the right lower lobe. Hepatobiliary: Pigtail drainage catheter is in the right upper quadrant the pigtail formed in the gallbladder fossa region. I believe this is likely within the gallbladder lumen, but this is difficult to confirm. The gallbladder is distended with marked wall thickening and surrounding inflammation, both worsening since prior study. At portions, the gallbladder wall is inseparable from the adjacent hepatic flexure of the colon. Pancreas: No focal abnormality or ductal dilatation. Spleen: No focal abnormality.  Normal size. Adrenals/Urinary Tract: Vascular calcifications in the renal arteries and hila bilaterally. No adrenal abnormality. No focal renal abnormality. No stones or hydronephrosis. Urinary bladder is unremarkable. Stomach/Bowel: Stomach, large and small bowel grossly unremarkable. Appendix is normal. Vascular/Lymphatic: Aortic and iliac calcifications. No evidence of aneurysm or adenopathy. Reproductive: No visible  focal abnormality. Other: No free fluid or free air. There is a gas and fluid collection noted anterior to the gallbladder, measuring approximately 5 by 1.6 cm which appears to be outside of the gallbladder, likely an abscess along the anterior gallbladder wall. Musculoskeletal: No acute bony abnormality.  IMPRESSION: Cholecystostomy tube is in place, presumably within the gallbladder lumen although this cannot be completely confirmed. There is worsening appearance of the gallbladder since prior CT with worsening wall thickening and surrounding inflammation. Probable extraluminal abscess anterior to the gallbladder fundus. Portions of the gallbladder wall are inseparable from the hepatic flexure of the colon. It is difficult to completely exclude fistula between the gallbladder and the colon. This would be better evaluated with contrast enhanced CT (both oral and IV) if possible. Moderate right pleural effusion and small left pleural effusion. Electronically Signed   By: Charlett Nose M.D.   On: 04/13/2018 18:36   Ir Catheter Tube Change  Result Date: 04/14/2018 INDICATION: 72 year old male with a history of acute cholecystitis. He was a poor operative candidate and had a percutaneous cholecystostomy tube placed on 01/31/2018. He recently presented with increasing abdominal pain and fever. CT imaging demonstrates progression of acute cholecystitis with tracking intra-abdominal infection. He presents for tube evaluation. EXAM: IR CATHETER TUBE CHANGE MEDICATIONS: None ANESTHESIA/SEDATION: None FLUOROSCOPY TIME:  Fluoroscopy Time: 3 minutes 12 seconds (41.7 mGy). COMPLICATIONS: None immediate. PROCEDURE: Informed written consent was obtained from the patient after a thorough discussion of the procedural risks, benefits and alternatives. All questions were addressed. Maximal Sterile Barrier Technique was utilized including caps, mask, sterile gowns, sterile gloves, sterile drape, hand hygiene and skin antiseptic. A timeout was performed prior to the initiation of the procedure. When the existing percutaneous drainage catheter was un bandaged, it was identified to be kinked several cm from the skin insertion site. The tube was unkinked before sterile prep and drape. A hand injection of contrast material was then  performed. The gallbladder lumen is diffusely irregular. Several outpouchings are present within the gallbladder wall consistent with areas of gallbladder wall breakdown. The cystic duct is patent. Contrast material enters the duodenum. Successful exchange and up size to a 12 [place] cook all-purpose drainage catheter. The catheter is formed with the locking loop in the gallbladder lumen. Aspiration yields approximately 20 mL of foul-smelling purulent material. The catheter was secured to the skin with an adhesive and suture based fixation device. IMPRESSION: 1. The existing 10 French percutaneous cholecystostomy tube was kinked. 2. Successful exchange for a new 12 French percutaneous cholecystostomy tube. Electronically Signed   By: Malachy Moan M.D.   On: 04/14/2018 11:28    Labs:  CBC: Recent Labs    04/01/18 0406 04/13/18 1504 04/14/18 0417 04/15/18 0523  WBC 14.2* 13.3* 11.3* 10.2  HGB 9.2* 9.5* 8.5* 8.3*  HCT 28.6* 28.3* 25.9* 25.8*  PLT 336 283 262 272    COAGS: Recent Labs    01/31/18 0842  INR 1.01    BMP: Recent Labs    04/01/18 0406 04/13/18 1504 04/14/18 0417 04/15/18 0523  NA 139 126* 132* 134*  K 3.8 4.9 4.2 4.6  CL 94* 92* 99* 98*  CO2 GLUCOSE 112* 516* 139* 160*  BUN 12 22* 20 23*  CALCIUM 9.0 8.9 8.8* 8.9  CREATININE 1.55* 1.99* 1.82* 1.84*  GFRNONAA 42* 31* 35* 34*  GFRAA 49* 36* 40* 40*    LIVER FUNCTION TESTS: Recent Labs    02/01/18 0529 03/27/18 1239 04/13/18  1504 04/14/18 0417  BILITOT 0.6 0.8 0.6 0.4  AST ALT 33 11* 13* 12*  ALKPHOS 91 135* 141* 131*  PROT 6.9 7.8 7.4 6.8  ALBUMIN 2.3* 2.6* 2.2* 2.0*    Assessment and Plan: Acute cholecysitits Patient s/p drain exchange and upsize 5/22.  Still with very little output although remains afebrile and WBC improved.  Note surgery team with concerns for drain placement as well as abnormal CT scan. Reviewed available imaging with IR MD.  Horace Porteous  well-positioned after exchange and injection at the time shows quick emptying of contrast which may account for lack of output from drain.  Injection yesterday also showed gall bladder may be abutting bowel, but no current fistula.  Patient with some clinical improvement since exchange.  Could consider monitoring with low threshold for repeat CT with IV contrast, if able (SCr 1.84), if worsens.  Continue routine drain care for now. Will follow.   Electronically Signed: Hoyt Koch, PA 04/15/2018, 4:30 PM   I spent a total of 15 Minutes at the the patient's bedside AND on the patient's hospital floor or unit, greater than 50% of which was counseling/coordinating care for acute cholecystitis.

## 2018-04-15 NOTE — Evaluation (Signed)
Physical Therapy Evaluation Patient Details Name: Jeremiah [name] MRN: [id number] DOB: Sep 14, 1946 Today's Date: 04/15/2018   History of Present Illness  72 y.o. M with HTN, DM, chronic diastolic CHF, CKD III, and peripheral vascular disease and admitted for cholecystitis with Perc tube replaced 5/22 by IR  Clinical Impression  Pt admitted with above diagnosis. Pt currently with functional limitations due to the deficits listed below (see PT Problem List).  Pt will benefit from skilled PT to increase their independence and safety with mobility to allow discharge to the venue listed below.  Pt assisted OOB and able to tolerate short distance ambulation.  Pt requiring increased assist for bed mobility however feels he would be able to better manage in his bed from home.  Pt reports plan for d/c home.  Recommend SNF at this time, if pt not agreeable, then HHPT.     Follow Up Recommendations SNF(pt reports d/c plan for home)    Equipment Recommendations  None recommended by PT    Recommendations for Other Services       Precautions / Restrictions Precautions Precautions: Fall Precaution Comments: R perc drain      Mobility  Bed Mobility Overal bed mobility: Needs Assistance Bed Mobility: Supine to Sit;Sit to Supine     Supine to sit: HOB elevated;Mod assist Sit to supine: Min assist   General bed mobility comments: assist for scooting to EOB and trunk upright, assist for LEs onto bed  Transfers Overall transfer level: Needs assistance Equipment used: Rolling walker (2 wheeled) Transfers: Sit to/from Stand Sit to Stand: Min assist         General transfer comment: assist to rise and steady as well as control descent  Ambulation/Gait Ambulation/Gait assistance: Min assist Ambulation Distance (Feet): 20 Feet Assistive device: Rolling walker (2 wheeled) Gait Pattern/deviations: Step-through pattern;Decreased stride length;Trunk flexed     General Gait Details: assist for  steadying and weakness, distance limited by fatigue, SOB and back pain, SPO2 97% room air  Stairs            Wheelchair Mobility    Modified Rankin (Stroke Patients Only)       Balance Overall balance assessment: Needs assistance         Standing balance support: Bilateral upper extremity supported Standing balance-Leahy Scale: Poor                               Pertinent Vitals/Pain Pain Assessment: 0-10 Pain Score: 7  Pain Location: chronic back pain Pain Descriptors / Indicators: Aching Pain Intervention(s): Limited activity within patient's tolerance;Repositioned;Monitored during session    Home Living Family/patient expects to be discharged to:: Private residence Living Arrangements: Spouse/significant other Available Help at Discharge: Family Type of Home: House Home Access: Stairs to enter Entrance Stairs-Rails: Doctor, general practice of Steps: 4 Home Layout: One level Home Equipment: Environmental consultant - 2 wheels      Prior Function Level of Independence: Independent with assistive device(s)         Comments: uses RW for ambulation     Hand Dominance        Extremity/Trunk Assessment        Lower Extremity Assessment Lower Extremity Assessment: Generalized weakness    Cervical / Trunk Assessment Cervical / Trunk Assessment: Kyphotic  Communication   Communication: No difficulties  Cognition Arousal/Alertness: Awake/alert Behavior During Therapy: WFL for tasks assessed/performed Overall Cognitive Status: Within Functional Limits for tasks assessed  General Comments      Exercises     Assessment/Plan    PT Assessment Patient needs continued PT services  PT Problem List Decreased strength;Decreased mobility;Decreased activity tolerance;Decreased balance;Decreased knowledge of use of DME       PT Treatment Interventions Therapeutic activities;DME  instruction;Stair training;Therapeutic exercise;Gait training;Functional mobility training;Patient/family education;Balance training    PT Goals (Current goals can be found in the Care Plan section)  Acute Rehab PT Goals PT Goal Formulation: With patient Time For Goal Achievement: 04/29/18 Potential to Achieve Goals: Good    Frequency Min 3X/week   Barriers to discharge        Co-evaluation               AM-PAC PT "6 Clicks" Daily Activity  Outcome Measure Difficulty turning over in bed (including adjusting bedclothes, sheets and blankets)?: A Lot Difficulty moving from lying on back to sitting on the side of the bed? : Unable Difficulty sitting down on and standing up from a chair with arms (e.g., wheelchair, bedside commode, etc,.)?: Unable Help needed moving to and from a bed to chair (including a wheelchair)?: A Little Help needed walking in hospital room?: A Little Help needed climbing 3-5 steps with a railing? : A Lot 6 Click Score: 12    End of Session Equipment Utilized During Treatment: Gait belt Activity Tolerance: Patient limited by fatigue Patient left: in bed;with bed alarm set;with call bell/phone within reach Nurse Communication: Mobility status PT Visit Diagnosis: Muscle weakness (generalized) (M62.81);Difficulty in walking, not elsewhere classified (R26.2)    Time: 1478-2956 PT Time Calculation (min) (ACUTE ONLY): 25 min   Charges:   PT Evaluation $PT Eval Moderate Complexity: 1 Mod PT Treatments $Gait Training: 8-22 mins   PT G Codes:        Zenovia Jarred, PT, DPT 04/15/2018 Pager: [phone]  Maida Sale E 04/15/2018, 4:14 PM

## 2018-04-15 NOTE — Progress Notes (Signed)
Patient ID: Jeremiah [name], male   DOB: 09/29/1946, 72 y.o.   MRN: [id number]       Subjective: Having less RUQ abdominal pain, feels a bit better.  Objective: Vital signs in last 24 hours: Temp:  [98.8 F (37.1 C)-101.2 F (38.4 C)] 98.8 F (37.1 C) (05/23 0530) Pulse Rate:  [80-89] 81 (05/23 0530) Resp:  [16-17] 16 (05/23 0530) BP: (106-126)/(49-54) 119/53 (05/23 0530) SpO2:  [92 %-95 %] 95 % (05/23 0530) Last BM Date: 04/11/18  Intake/Output from previous day: 05/22 0701 - 05/23 0700 In: 656.7 [P.O.:240; I.V.:211.7; IV Piggyback:200] Out: 700 [Urine:700] Intake/Output this shift: No intake/output data recorded.  PE: Heart: regular, +murmur Lungs: CTAB Abd: soft, tender in RUQ, drain with foul smelling, purulent output, +BS, ND  Lab Results:  Recent Labs    04/14/18 0417 04/15/18 0523  WBC 11.3* 10.2  HGB 8.5* 8.3*  HCT 25.9* 25.8*  PLT 262 272   BMET Recent Labs    04/14/18 0417 04/15/18 0523  NA 132* 134*  K 4.2 4.6  CL 99* 98*  CO2 23 25  GLUCOSE 139* 160*  BUN 20 23*  CREATININE 1.82* 1.84*  CALCIUM 8.8* 8.9   PT/INR No results for input(s): LABPROT, INR in the last 72 hours. CMP     Component Value Date/Time   NA 134 (L) 04/15/2018 0523   K 4.6 04/15/2018 0523   CL 98 (L) 04/15/2018 0523   CO2 25 04/15/2018 0523   GLUCOSE 160 (H) 04/15/2018 0523   BUN 23 (H) 04/15/2018 0523   CREATININE 1.84 (H) 04/15/2018 0523   CALCIUM 8.9 04/15/2018 0523   PROT 6.8 04/14/2018 0417   ALBUMIN 2.0 (L) 04/14/2018 0417   AST 17 04/14/2018 0417   ALT 12 (L) 04/14/2018 0417   ALKPHOS 131 (H) 04/14/2018 0417   BILITOT 0.4 04/14/2018 0417   GFRNONAA 34 (L) 04/15/2018 0523   GFRAA 40 (L) 04/15/2018 0523   Lipase     Component Value Date/Time   LIPASE 26 04/13/2018 1504       Studies/Results: Ct Abdomen Pelvis Wo Contrast  Result Date: 04/13/2018 CLINICAL DATA:  Hyperglycemia, nausea, vomiting. EXAM: CT ABDOMEN AND PELVIS WITHOUT CONTRAST  TECHNIQUE: Multidetector CT imaging of the abdomen and pelvis was performed following the standard protocol without IV contrast. COMPARISON:  01/30/2018 FINDINGS: Lower chest: Moderate right and small left pleural effusions. Compressive atelectasis in the right lower lobe. Hepatobiliary: Pigtail drainage catheter is in the right upper quadrant the pigtail formed in the gallbladder fossa region. I believe this is likely within the gallbladder lumen, but this is difficult to confirm. The gallbladder is distended with marked wall thickening and surrounding inflammation, both worsening since prior study. At portions, the gallbladder wall is inseparable from the adjacent hepatic flexure of the colon. Pancreas: No focal abnormality or ductal dilatation. Spleen: No focal abnormality.  Normal size. Adrenals/Urinary Tract: Vascular calcifications in the renal arteries and hila bilaterally. No adrenal abnormality. No focal renal abnormality. No stones or hydronephrosis. Urinary bladder is unremarkable. Stomach/Bowel: Stomach, large and small bowel grossly unremarkable. Appendix is normal. Vascular/Lymphatic: Aortic and iliac calcifications. No evidence of aneurysm or adenopathy. Reproductive: No visible focal abnormality. Other: No free fluid or free air. There is a gas and fluid collection noted anterior to the gallbladder, measuring approximately 5 by 1.6 cm which appears to be outside of the gallbladder, likely an abscess along the anterior gallbladder wall. Musculoskeletal: No acute bony abnormality. IMPRESSION: Cholecystostomy tube is in place,  presumably within the gallbladder lumen although this cannot be completely confirmed. There is worsening appearance of the gallbladder since prior CT with worsening wall thickening and surrounding inflammation. Probable extraluminal abscess anterior to the gallbladder fundus. Portions of the gallbladder wall are inseparable from the hepatic flexure of the colon. It is difficult to  completely exclude fistula between the gallbladder and the colon. This would be better evaluated with contrast enhanced CT (both oral and IV) if possible. Moderate right pleural effusion and small left pleural effusion. Electronically Signed   By: Charlett Nose M.D.   On: 04/13/2018 18:36   Ir Catheter Tube Change  Result Date: 04/14/2018 INDICATION: 72 year old male with a history of acute cholecystitis. He was a poor operative candidate and had a percutaneous cholecystostomy tube placed on 01/31/2018. He recently presented with increasing abdominal pain and fever. CT imaging demonstrates progression of acute cholecystitis with tracking intra-abdominal infection. He presents for tube evaluation. EXAM: IR CATHETER TUBE CHANGE MEDICATIONS: None ANESTHESIA/SEDATION: None FLUOROSCOPY TIME:  Fluoroscopy Time: 3 minutes 12 seconds (41.7 mGy). COMPLICATIONS: None immediate. PROCEDURE: Informed written consent was obtained from the patient after a thorough discussion of the procedural risks, benefits and alternatives. All questions were addressed. Maximal Sterile Barrier Technique was utilized including caps, mask, sterile gowns, sterile gloves, sterile drape, hand hygiene and skin antiseptic. A timeout was performed prior to the initiation of the procedure. When the existing percutaneous drainage catheter was un bandaged, it was identified to be kinked several cm from the skin insertion site. The tube was unkinked before sterile prep and drape. A hand injection of contrast material was then performed. The gallbladder lumen is diffusely irregular. Several outpouchings are present within the gallbladder wall consistent with areas of gallbladder wall breakdown. The cystic duct is patent. Contrast material enters the duodenum. Successful exchange and up size to a 12 [place] cook all-purpose drainage catheter. The catheter is formed with the locking loop in the gallbladder lumen. Aspiration yields approximately 20 mL of  foul-smelling purulent material. The catheter was secured to the skin with an adhesive and suture based fixation device. IMPRESSION: 1. The existing 10 French percutaneous cholecystostomy tube was kinked. 2. Successful exchange for a new 12 French percutaneous cholecystostomy tube. Electronically Signed   By: Malachy Moan M.D.   On: 04/14/2018 11:28    Anti-infectives: Anti-infectives (From admission, onward)   Start     Dose/Rate Route Frequency Ordered Stop   04/14/18 0400  piperacillin-tazobactam (ZOSYN) IVPB 3.375 g     3.375 g 12.5 mL/hr over 240 Minutes Intravenous Every 8 hours 04/13/18 2140     04/13/18 1915  ceFEPIme (MAXIPIME) 1 g in sodium chloride 0.9 % 100 mL IVPB  Status:  Discontinued     1 g 200 mL/hr over 30 Minutes Intravenous  Once 04/13/18 1903 04/13/18 1908   04/13/18 1915  piperacillin-tazobactam (ZOSYN) IVPB 3.375 g     3.375 g 100 mL/hr over 30 Minutes Intravenous  Once 04/13/18 1909 04/13/18 2022       Assessment/Plan Acute cholecystitis, s/p perc chole drain 01-31-18 -patient was cleared by cardiology for cholecystectomy recently with plans for surgeryon 6/6.  Patient began noticing purulent drainage from his bag with fevers of 101 and over. -CT scan reviewed.  He has air in the gallbladder fossa, ? Necrotic gallbladder, necrotic liver, abscess.   - IR studied drain showing kinking in the tubing.  Drain was upsized -cont abx therapy -will follow  HTN DM PVD Diastolic CHF   FEN -  advance diet as tolerated VTE - SCDs, can have chemical prophylaxis from our standpoint ID - Zosyn   LOS: 2 days    Vanita Panda, MD  Colorectal and General Surgery [facility]

## 2018-04-15 NOTE — Progress Notes (Addendum)
PROGRESS NOTE    Jeremiah [name]  UJW:[id number] DOB: [birth year] DOA: 04/13/2018 PCP: Jeremiah Cloud, MD      Brief Narrative:  Jeremiah [name] is a 72 y.o. M with HTN, DM, chronic diastolic CHF, CKD III, and peripheral vascular disease   Assessment & Plan:  Cholecystitis Perc tube replaced 5/22 by IR, increased size.  Intra-op contrast showed no fistula.  Blood cx NGTD. -Consult Gen Surg -Consult IR -Continue Zosyn -Follow blood cultures, unfortunately no cultures obtained from drain or repeat perc tube   Hypertension Peripheral vascular disease secondary prevention -Continue statin, metoprolol -Hold home ARB given AKI  Chronic diastolic CHF Recent echo showed EF 50-55%, grade I DD. -Hold home ARB and furosemide until Cr improves  AKI on CKD III Baseline Cr 1.4-1.7.  Up 1.99 with admission, from pre-renal injury.  Improved slightly today. -Trend BMP -Hold ARB, diuretic  Diabetes BG well controlled here. -Change insulin to [facility] and HS  Hyponatremia Resolved  Anemia From chronic disease, mild.  Hgb slightly down today, no clinical bleeding. -Trend CBC     DVT prophylaxis: Lovenox Code Status: FULL Family Communication: None present MDM and disposition Plan: Below labs and imaging reports were reviewed and summarized above.  The patient status is much improved after his PErc tube placement yesterday.  He was admitted with nausea and vomiting and fever in the setting of failure of his percutaneous cholecystostomy tube.  Now it is replaced and he is improving.  We will continue IV antibiotics for today, likely can switch to Augmentin within the next 24 hours, further disposition per general surgery.   Consultants:   Gen Surg  IR  Procedures:   Perc tube replacement When the existing percutaneous drainage catheter was un bandaged, it was identified to be kinked several cm from the skin insertion site. The tube was unkinked before sterile prep and drape. A  hand injection of contrast material was then performed. The gallbladder lumen is diffusely irregular. Several outpouchings are present within the gallbladder wall consistent with areas of gallbladder wall breakdown. The cystic duct is patent. Contrast material enters the duodenum.  Successful exchange and up size to a 12 [place] cook all-purpose drainage catheter. The catheter is formed with the locking loop in the gallbladder lumen. Aspiration yields approximately 20 mL of foul-smelling purulent material. The catheter was secured to the skin with an adhesive and suture based fixation device.  IMPRESSION: 1. The existing 10 French percutaneous cholecystostomy tube was kinked. 2. Successful exchange for a new 12 French percutaneous cholecystostomy tube.   Antimicrobials:   Zosyn 5/21 >>    Subjective: Good appetite.  Slept well.  No new fever since procedure.  Leg swelling resolved.  Abdominal distension resolved.  No vomiting, confusion, weakness.   No rectal bleeding, bleeding from tube site, melena.  Objective: Vitals:   04/14/18 1641 04/14/18 1659 04/14/18 2001 04/15/18 0530  BP:  (!) 120/54 (!) 112/49 (!) 119/53  Pulse:  84 81 81  Resp:  Temp: (!) 101.1 F (38.4 C) 99.3 F (37.4 C) 99.6 F (37.6 C) 98.8 F (37.1 C)  TempSrc: Oral Oral Oral Oral  SpO2:  94% 94% 95%  Weight:      Height:        Intake/Output Summary (Last 24 hours) at 04/15/2018 0956 Last data filed at 04/15/2018 0930 Gross per 24 hour  Intake 896.67 ml  Output 700 ml  Net 196.67 ml   Filed Weights   04/13/18 1425  04/14/18 0500  Weight: 86.2 kg (190 lb) 81.4 kg (179 lb 7.3 oz)    Examination: General appearance: Elderly adult male, lying in bed, easily arousable, no acute distress. HEENT: Anicteric, conjunctive are pink, lids and lashes normal.  No nasal deformity, discharge, or epistaxis.  Lips moist, dentures in place.  No oral lesions, OP moist, hearing normal. Skin: Skin warm  and dry, no suspicious rashes or lesions, no redness, swelling, or induration around the PErc tube. Cardiac: RRR, no murmurs, no lower extremity edema. Respiratory: Normal respiratory rate and rhythm, lungs clear without rales or wheezes. Abdomen: Abdomen soft, no tenderness to palpation, no guarding, no rebound, no rigidity.  No distention no scars, PErc tube in place.   MSK: No deformities or effusions in the large joints of the upper lower extremities bilaterally. Neuro: Leaping but easily arousable, cranial nerves grossly normal, speech fluent, moves all extremities normal strength    Psych: Oriented to person, place, and time.  Attention Sleepy, affect normal, judgment and insight appear normal.   Data Reviewed: I have personally reviewed following labs and imaging studies:  CBC: Recent Labs  Lab 04/13/18 1504 04/14/18 0417 04/15/18 0523  WBC 13.3* 11.3* 10.2  HGB 9.5* 8.5* 8.3*  HCT 28.3* 25.9* 25.8*  MCV 80.9 80.7 81.1  PLT 283 262 272   Basic Metabolic Panel: Recent Labs  Lab 04/13/18 1504 04/14/18 0417 04/15/18 0523  NA 126* 132* 134*  K 4.9 4.2 4.6  CL 92* 99* 98*  CO2 GLUCOSE 516* 139* 160*  BUN 22* 20 23*  CREATININE 1.99* 1.82* 1.84*  CALCIUM 8.9 8.8* 8.9   GFR: Estimated Creatinine Clearance: 36.4 mL/min (A) (by C-G formula based on SCr of 1.84 mg/dL (H)). Liver Function Tests: Recent Labs  Lab 04/13/18 1504 04/14/18 0417  AST 19 17  ALT 13* 12*  ALKPHOS 141* 131*  BILITOT 0.6 0.4  PROT 7.4 6.8  ALBUMIN 2.2* 2.0*   Recent Labs  Lab 04/13/18 1504  LIPASE 26   No results for input(s): AMMONIA in the last 168 hours. Coagulation Profile: No results for input(s): INR, PROTIME in the last 168 hours. Cardiac Enzymes: No results for input(s): CKTOTAL, CKMB, CKMBINDEX, TROPONINI in the last 168 hours. BNP (last 3 results) No results for input(s): PROBNP in the last 8760 hours. HbA1C: No results for input(s): HGBA1C in the last 72  hours. CBG: Recent Labs  Lab 04/14/18 1643 04/14/18 1930 04/14/18 2315 04/15/18 0419 04/15/18 0743  GLUCAP 206* 176* 181* 179* 136*   Lipid Profile: No results for input(s): CHOL, HDL, LDLCALC, TRIG, CHOLHDL, LDLDIRECT in the last 72 hours. Thyroid Function Tests: No results for input(s): TSH, T4TOTAL, FREET4, T3FREE, THYROIDAB in the last 72 hours. Anemia Panel: No results for input(s): VITAMINB12, FOLATE, FERRITIN, TIBC, IRON, RETICCTPCT in the last 72 hours. Urine analysis:    Component Value Date/Time   COLORURINE YELLOW 04/13/2018 1504   APPEARANCEUR CLEAR 04/13/2018 1504   LABSPEC 1.020 04/13/2018 1504   PHURINE 5.5 04/13/2018 1504   GLUCOSEU 250 (A) 04/13/2018 1504   HGBUR MODERATE (A) 04/13/2018 1504   BILIRUBINUR NEGATIVE 04/13/2018 1504   KETONESUR NEGATIVE 04/13/2018 1504   PROTEINUR 100 (A) 04/13/2018 1504   UROBILINOGEN 0.2 01/09/2009 1532   NITRITE NEGATIVE 04/13/2018 1504   LEUKOCYTESUR NEGATIVE 04/13/2018 1504   Sepsis Labs: (procalcitonin:4,lacticacidven:4)  ) Recent Results (from the past 240 hour(s))  Blood culture (routine x 2)     Status: None (Preliminary result)  Collection Time: 04/13/18  7:48 PM  Result Value Ref Range Status   Specimen Description   Final    BLOOD RIGHT WRIST Performed at [facility], [address] [place], [place] [address]    Special Requests   Final    BOTTLES DRAWN AEROBIC AND ANAEROBIC Blood Culture adequate volume Performed at [facility], [address] [place], [place] [address]    Culture   Final    NO GROWTH < 24 HOURS Performed at [facility], [address], [place], [place] [address]    Report Status PENDING  Incomplete  Blood culture (routine x 2)     Status: None (Preliminary result)   Collection Time: 04/13/18  7:49 PM  Result Value Ref Range Status   Specimen Description   Final    BLOOD RIGHT HAND Performed at [facility], [address] [place], [place] [address]    Special Requests   Final    BOTTLES DRAWN AEROBIC AND ANAEROBIC Blood Culture results may not be optimal due to an excessive volume of blood received in culture bottles Performed at [facility], [address], [place], [place] [address]    Culture   Final    NO GROWTH < 24 HOURS Performed at [facility], [address], [place], [place] [address]    Report Status PENDING  Incomplete         Radiology Studies: Ct Abdomen Pelvis Wo Contrast  Result Date: 04/13/2018 CLINICAL DATA:  Hyperglycemia, nausea, vomiting. EXAM: CT ABDOMEN AND PELVIS WITHOUT CONTRAST TECHNIQUE: Multidetector CT imaging of the abdomen and pelvis was performed following the standard protocol without IV contrast. COMPARISON:  01/30/2018 FINDINGS: Lower chest: Moderate right and small left pleural effusions. Compressive atelectasis in the right lower lobe. Hepatobiliary: Pigtail drainage catheter is in the right upper quadrant the pigtail formed in the gallbladder fossa region. I believe this is likely within the gallbladder lumen, but this is difficult to confirm. The gallbladder is distended with marked wall thickening and surrounding inflammation, both worsening since prior study. At portions, the gallbladder wall is inseparable from the adjacent hepatic flexure of the colon. Pancreas: No focal abnormality or ductal dilatation. Spleen: No focal abnormality.  Normal size. Adrenals/Urinary Tract: Vascular calcifications in the renal arteries and hila bilaterally. No adrenal abnormality. No focal renal abnormality. No stones or hydronephrosis. Urinary bladder is unremarkable. Stomach/Bowel: Stomach, large and small bowel grossly unremarkable. Appendix is normal. Vascular/Lymphatic: Aortic and iliac calcifications. No evidence of aneurysm or adenopathy. Reproductive: No visible focal abnormality. Other: No free fluid or free air. There is a gas  and fluid collection noted anterior to the gallbladder, measuring approximately 5 by 1.6 cm which appears to be outside of the gallbladder, likely an abscess along the anterior gallbladder wall. Musculoskeletal: No acute bony abnormality. IMPRESSION: Cholecystostomy tube is in place, presumably within the gallbladder lumen although this cannot be completely confirmed. There is worsening appearance of the gallbladder since prior CT with worsening wall thickening and surrounding inflammation. Probable extraluminal abscess anterior to the gallbladder fundus. Portions of the gallbladder wall are inseparable from the hepatic flexure of the colon. It is difficult to completely exclude fistula between the gallbladder and the colon. This would be better evaluated with contrast enhanced CT (both oral and IV) if possible. Moderate right pleural effusion and small left pleural effusion. Electronically Signed   By: Charlett Nose M.D.   On: 04/13/2018 18:36  Ir Catheter Tube Change  Result Date: 04/14/2018 INDICATION: 72 year old male with a history of acute cholecystitis. He was a poor operative candidate and had a percutaneous cholecystostomy tube placed on 01/31/2018. He recently presented with increasing abdominal pain and fever. CT imaging demonstrates progression of acute cholecystitis with tracking intra-abdominal infection. He presents for tube evaluation. EXAM: IR CATHETER TUBE CHANGE MEDICATIONS: None ANESTHESIA/SEDATION: None FLUOROSCOPY TIME:  Fluoroscopy Time: 3 minutes 12 seconds (41.7 mGy). COMPLICATIONS: None immediate. PROCEDURE: Informed written consent was obtained from the patient after a thorough discussion of the procedural risks, benefits and alternatives. All questions were addressed. Maximal Sterile Barrier Technique was utilized including caps, mask, sterile gowns, sterile gloves, sterile drape, hand hygiene and skin antiseptic. A timeout was performed prior to the initiation of the procedure. When  the existing percutaneous drainage catheter was un bandaged, it was identified to be kinked several cm from the skin insertion site. The tube was unkinked before sterile prep and drape. A hand injection of contrast material was then performed. The gallbladder lumen is diffusely irregular. Several outpouchings are present within the gallbladder wall consistent with areas of gallbladder wall breakdown. The cystic duct is patent. Contrast material enters the duodenum. Successful exchange and up size to a 12 [place] cook all-purpose drainage catheter. The catheter is formed with the locking loop in the gallbladder lumen. Aspiration yields approximately 20 mL of foul-smelling purulent material. The catheter was secured to the skin with an adhesive and suture based fixation device. IMPRESSION: 1. The existing 10 French percutaneous cholecystostomy tube was kinked. 2. Successful exchange for a new 12 French percutaneous cholecystostomy tube. Electronically Signed   By: Malachy Moan M.D.   On: 04/14/2018 11:28        Scheduled Meds: . brimonidine  1 drop Both Eyes BID  . insulin aspart  0-9 Units Subcutaneous Q4H  . metoprolol succinate  50 mg Oral Daily  . simvastatin  40 mg Oral Daily  . sodium chloride flush  5 mL Intracatheter Q8H  . timolol  1 drop Both Eyes BID   Continuous Infusions: . piperacillin-tazobactam (ZOSYN)  IV Stopped (04/15/18 0826)     LOS: 2 days    Time spent: 25 minutes    Alberteen Sam, MD [facility] 04/15/2018, 9:56 AM     Pager [phone] --- please page though AMION:  [email or web address] If 7PM-7AM, please contact night-coverage

## 2018-04-16 ENCOUNTER — Encounter (HOSPITAL_COMMUNITY): Payer: Self-pay

## 2018-04-16 ENCOUNTER — Encounter (HOSPITAL_COMMUNITY)
Admission: RE | Admit: 2018-04-16 | Discharge: 2018-04-16 | Disposition: A | Discharge status: Home / Self Care | Payer: Medicare Other | Source: Ambulatory Visit | Attending: Cardiology | Admitting: Cardiology

## 2018-04-16 LAB — GLUCOSE, CAPILLARY
Glucose-Capillary: 139 mg/dL — ABNORMAL HIGH (ref 65–99)
Glucose-Capillary: 152 mg/dL — ABNORMAL HIGH (ref 65–99)
Glucose-Capillary: 100 mg/dL — ABNORMAL HIGH (ref 65–99)
Glucose-Capillary: 139 mg/dL — ABNORMAL HIGH (ref 65–99)

## 2018-04-16 LAB — CBC
HCT: 25.7 % — ABNORMAL LOW (ref 39.0–52.0)
Hemoglobin: 8.4 g/dL — ABNORMAL LOW (ref 13.0–17.0)
MCH: 26.6 pg (ref 26.0–34.0)
MCHC: 32.7 g/dL (ref 30.0–36.0)
MCV: 81.3 fL (ref 78.0–100.0)
Platelets: 265 10*3/uL (ref 150–400)
RBC: 3.16 MIL/uL — ABNORMAL LOW (ref 4.22–5.81)
RDW: 14.9 % (ref 11.5–15.5)
WBC: 10 10*3/uL (ref 4.0–10.5)

## 2018-04-16 LAB — BASIC METABOLIC PANEL
Anion gap: 10 (ref 5–15)
BUN: 22 mg/dL — ABNORMAL HIGH (ref 6–20)
CO2: 27 mmol/L (ref 22–32)
Calcium: 9 mg/dL (ref 8.9–10.3)
Chloride: 98 mmol/L — ABNORMAL LOW (ref 101–111)
Creatinine, Ser: 1.72 mg/dL — ABNORMAL HIGH (ref 0.61–1.24)
GFR calc Af Amer: 43 mL/min — ABNORMAL LOW (ref >60)
GFR calc non Af Amer: 37 mL/min — ABNORMAL LOW (ref >60)
Glucose, Bld: 134 mg/dL — ABNORMAL HIGH (ref 65–99)
Potassium: 4.3 mmol/L (ref 3.5–5.1)
Sodium: 135 mmol/L (ref 135–145)

## 2018-04-16 LAB — HEMOGLOBIN A1C
Hgb A1c MFr Bld: 8.9 % — ABNORMAL HIGH (ref 4.8–5.6)
Mean Plasma Glucose: 208.73 mg/dL

## 2018-04-16 NOTE — Pre-Procedure Instructions (Signed)
Burle Kwan [facility]  04/16/2018      [facility] - [place], [place] - [address] [place] [address] Phone: [phone] Fax: [phone]    Your procedure is scheduled on Thursday June 6.  Report to [facility] Admitting at 5:30 A.M.  Call this number if you have problems the morning of surgery:  [phone]   Remember:  No food after midnight.   **DRINK Ensure pre-surgery drink prior to leaving home the morning of surgery**    Take these medicines the morning of surgery with A SIP OF WATER:   Metoprolol (Toprol XL) Eye drops  7 days prior to surgery STOP taking any Aleve, Naproxen, Ibuprofen, Motrin, Advil, Goody's, BC's, all herbal medications, fish oil, and all vitamins  **FOLLOW your surgeon's instructions on stopping Aspirin. If no instructions were given, please call your surgeon's office**      Do not wear jewelry, make-up or nail polish.  Do not wear lotions, powders, or perfumes, or deodorant.  Do not shave 48 hours prior to surgery.  Men may shave face and neck.  Do not bring valuables to the hospital.  [facility] is not responsible for any belongings or valuables.  Contacts, dentures or bridgework may not be worn into surgery.  Leave your suitcase in the car.  After surgery it may be brought to your room.  For patients admitted to the hospital, discharge time will be determined by your treatment team.  Patients discharged the day of surgery will not be allowed to drive home.    Special instructions:    - Preparing For Surgery  Before surgery, you can play an important role. Because skin is not sterile, your skin needs to be as free of germs as possible. You can reduce the number of germs on your skin by washing with CHG (chlorahexidine gluconate) Soap before surgery.  CHG is an antiseptic cleaner which kills germs and bonds with the skin to continue killing germs even after washing.     Oral Hygiene is also important to reduce your risk of infection.  Remember - BRUSH YOUR TEETH THE MORNING OF SURGERY WITH YOUR REGULAR TOOTHPASTE  Please do not use if you have an allergy to CHG or antibacterial soaps. If your skin becomes reddened/irritated stop using the CHG.  Do not shave (including legs and underarms) for at least 48 hours prior to first CHG shower. It is OK to shave your face.  Please follow these instructions carefully.   1. Shower the NIGHT BEFORE SURGERY and the MORNING OF SURGERY with CHG.   2. If you chose to wash your hair, wash your hair first as usual with your normal shampoo.  3. After you shampoo, rinse your hair and body thoroughly to remove the shampoo.  4. Use CHG as you would any other liquid soap. You can apply CHG directly to the skin and wash gently with a scrungie or a clean washcloth.   5. Apply the CHG Soap to your body ONLY FROM THE NECK DOWN.  Do not use on open wounds or open sores. Avoid contact with your eyes, ears, mouth and genitals (private parts). Wash Face and genitals (private parts)  with your normal soap.  6. Wash thoroughly, paying special attention to the area where your surgery will be performed.  7. Thoroughly rinse your body with warm water from the neck down.  8. DO NOT shower/wash with your normal soap after using  and rinsing off the CHG Soap.  9. Pat yourself dry with a CLEAN TOWEL.  10. Wear CLEAN PAJAMAS to bed the night before surgery, wear comfortable clothes the morning of surgery  11. Place CLEAN SHEETS on your bed the night of your first shower and DO NOT SLEEP WITH PETS.    Day of Surgery:  Do not apply any deodorants/lotions.  Please wear clean clothes to the hospital/surgery center.   Remember to brush your teeth WITH YOUR REGULAR TOOTHPASTE.    Please read over the following fact sheets that you were given. Coughing and Deep Breathing, MRSA Information and Surgical Site Infection  Prevention

## 2018-04-16 NOTE — Progress Notes (Signed)
Patient ID: Jeremiah [name], male   DOB: 10-14-1946, 72 y.o.   MRN: [id number]       Subjective: Pt feels well today.  Eating solid food, but not eating a ton.  No abdominal pain.  Objective: Vital signs in last 24 hours: Temp:  [98.4 F (36.9 C)-99.4 F (37.4 C)] 98.4 F (36.9 C) (05/24 0500) Pulse Rate:  [66-78] 66 (05/24 0500) Resp:  [16-18] 18 (05/24 0500) BP: (120-139)/(52-57) 133/57 (05/24 0500) SpO2:  [94 %-95 %] 94 % (05/24 0500) Weight:  [85.1 kg (187 lb 9.8 oz)] 85.1 kg (187 lb 9.8 oz) (05/24 0500) Last BM Date: 04/11/18  Intake/Output from previous day: 05/23 0701 - 05/24 0700 In: 640 [P.O.:480; I.V.:5; IV Piggyback:150] Out: 1000 [Urine:950; Drains:50] Intake/Output this shift: No intake/output data recorded.  PE: Abd: soft, NT, ND, +BS, drain with minimal output in drain bag  Lab Results:  Recent Labs    04/15/18 0523 04/16/18 0435  WBC 10.2 10.0  HGB 8.3* 8.4*  HCT 25.8* 25.7*  PLT 272 265   BMET Recent Labs    04/15/18 0523 04/16/18 0435  NA 134* 135  K 4.6 4.3  CL 98* 98*  CO2 25 27  GLUCOSE 160* 134*  BUN 23* 22*  CREATININE 1.84* 1.72*  CALCIUM 8.9 9.0   PT/INR No results for input(s): LABPROT, INR in the last 72 hours. CMP     Component Value Date/Time   NA 135 04/16/2018 0435   K 4.3 04/16/2018 0435   CL 98 (L) 04/16/2018 0435   CO2 27 04/16/2018 0435   GLUCOSE 134 (H) 04/16/2018 0435   BUN 22 (H) 04/16/2018 0435   CREATININE 1.72 (H) 04/16/2018 0435   CALCIUM 9.0 04/16/2018 0435   PROT 6.8 04/14/2018 0417   ALBUMIN 2.0 (L) 04/14/2018 0417   AST 17 04/14/2018 0417   ALT 12 (L) 04/14/2018 0417   ALKPHOS 131 (H) 04/14/2018 0417   BILITOT 0.4 04/14/2018 0417   GFRNONAA 37 (L) 04/16/2018 0435   GFRAA 43 (L) 04/16/2018 0435   Lipase     Component Value Date/Time   LIPASE 26 04/13/2018 1504       Studies/Results: Ir Catheter Tube Change  Result Date: 04/14/2018 INDICATION: 72 year old male with a history of acute  cholecystitis. He was a poor operative candidate and had a percutaneous cholecystostomy tube placed on 01/31/2018. He recently presented with increasing abdominal pain and fever. CT imaging demonstrates progression of acute cholecystitis with tracking intra-abdominal infection. He presents for tube evaluation. EXAM: IR CATHETER TUBE CHANGE MEDICATIONS: None ANESTHESIA/SEDATION: None FLUOROSCOPY TIME:  Fluoroscopy Time: 3 minutes 12 seconds (41.7 mGy). COMPLICATIONS: None immediate. PROCEDURE: Informed written consent was obtained from the patient after a thorough discussion of the procedural risks, benefits and alternatives. All questions were addressed. Maximal Sterile Barrier Technique was utilized including caps, mask, sterile gowns, sterile gloves, sterile drape, hand hygiene and skin antiseptic. A timeout was performed prior to the initiation of the procedure. When the existing percutaneous drainage catheter was un bandaged, it was identified to be kinked several cm from the skin insertion site. The tube was unkinked before sterile prep and drape. A hand injection of contrast material was then performed. The gallbladder lumen is diffusely irregular. Several outpouchings are present within the gallbladder wall consistent with areas of gallbladder wall breakdown. The cystic duct is patent. Contrast material enters the duodenum. Successful exchange and up size to a 12 [place] cook all-purpose drainage catheter. The catheter is formed with the  locking loop in the gallbladder lumen. Aspiration yields approximately 20 mL of foul-smelling purulent material. The catheter was secured to the skin with an adhesive and suture based fixation device. IMPRESSION: 1. The existing 10 French percutaneous cholecystostomy tube was kinked. 2. Successful exchange for a new 12 French percutaneous cholecystostomy tube. Electronically Signed   By: Malachy Moan M.D.   On: 04/14/2018 11:28    Anti-infectives: Anti-infectives  (From admission, onward)   Start     Dose/Rate Route Frequency Ordered Stop   04/14/18 0400  piperacillin-tazobactam (ZOSYN) IVPB 3.375 g     3.375 g 12.5 mL/hr over 240 Minutes Intravenous Every 8 hours 04/13/18 2140     04/13/18 1915  ceFEPIme (MAXIPIME) 1 g in sodium chloride 0.9 % 100 mL IVPB  Status:  Discontinued     1 g 200 mL/hr over 30 Minutes Intravenous  Once 04/13/18 1903 04/13/18 1908   04/13/18 1915  piperacillin-tazobactam (ZOSYN) IVPB 3.375 g     3.375 g 100 mL/hr over 30 Minutes Intravenous  Once 04/13/18 1909 04/13/18 2022       Assessment/Plan  Acute cholecystitis, s/p perc chole drain 01-31-18 -s/p drain exchange and upsize on 5-22. -no abdominal pain -can convert to oral abx.  Would recommend at least 2 weeks of abx therapy.  He will follow up with Dr. Carolynne Edouard next Wednesday to discuss new timing of surgery.  If more or fewer abx therapy is wanted then it can be changed at that time. -patient is stable from our standpoint for DC home when medically stable.  HTN DM PVD Diastolic CHF   FEN - soft diet VTE - SCDs, Lovenox ID - Zosyn   LOS: 3 days    Letha Cape , [facility] Surgery 04/16/2018, 9:22 AM Pager: [phone]

## 2018-04-16 NOTE — Progress Notes (Signed)
Physical Therapy Treatment Patient Details Name: Jeremiah [name] MRN: [id number] DOB: [birth year] Today's Date: 04/16/2018    History of Present Illness 72 y.o. M with HTN, DM, chronic diastolic CHF, CKD III, and peripheral vascular disease and admitted for cholecystitis with Perc tube replaced 5/22 by IR    PT Comments    Pt OOB in recliner with family in room.  Was asked by RN to show family mobility esp transfers. General transfer comment: from lower level recliner chair, pt was unable to self rise despite several attempts.  Required MAX Assist.  From elevated toilet pt required MinAssist both situations pt had Mod posterior lean and difficulty self correcting.  Also required assist to maintain standing balance during self peri care.  General Gait Details: pt was only able to amb to and from bathroom due to Max c/o fatigue and weakness after each activity.  "I can't believe how weak I am"  Very unsteady gait with poor forward flex posture and lean on walker. HIGH FALL RISK.  Pt was unable to amb in hallway after amb to bathroom due to fatigue.  Assisted back to bed.  Follow Up Recommendations  SNF(pt wants home but spouse is not able to physically assist)     Equipment Recommendations  None recommended by PT    Recommendations for Other Services       Precautions / Restrictions Precautions Precautions: Fall Precaution Comments: R perc drain Restrictions Weight Bearing Restrictions: No    Mobility  Bed Mobility Overal bed mobility: Needs Assistance Bed Mobility: Sit to Supine       Sit to supine: Max assist   General bed mobility comments: assisted back to bed with MAX c/o fatigue  Transfers Overall transfer level: Needs assistance Equipment used: Rolling walker (2 wheeled);2 person hand held assist Transfers: Sit to/from [employer] Sit to Stand: Max assist         General transfer comment: from lower level recliner chair, pt was unable to self rise  despite several attempts.  Required MAX Assist.  From elevated toilet pt required MinAssist both situations pt had Mod posterior lean and difficulty self correcting.  Also required assist to maintain standing balance during self peri care.    Ambulation/Gait Ambulation/Gait assistance: Min assist Ambulation Distance (Feet): 10 Feet Assistive device: Rolling walker (2 wheeled) Gait Pattern/deviations: Step-through pattern;Decreased stride length;Trunk flexed Gait velocity: decreased   General Gait Details: pt was only able to amb to and from bathroom due to Max c/o fatigue and weakness after each activity.  "I can't believe how weak I am"  Very unsteady gait with poor forward flex posture and lean on walker.  Pt was unable to amb in hallway after amb to bathroom due to fatigue.  Assisted back to bed.   Stairs             Wheelchair Mobility    Modified Rankin (Stroke Patients Only)       Balance                                            Cognition Arousal/Alertness: Awake/alert Behavior During Therapy: WFL for tasks assessed/performed Overall Cognitive Status: Within Functional Limits for tasks assessed  Exercises      General Comments        Pertinent Vitals/Pain Pain Assessment: Faces Faces Pain Scale: Hurts a little bit Pain Location: chronic back pain and L ABD near drain site Pain Descriptors / Indicators: Aching;Constant;Discomfort;Grimacing Pain Intervention(s): Monitored during session    Home Living                      Prior Function            PT Goals (current goals can now be found in the care plan section) Progress towards PT goals: Progressing toward goals    Frequency    Min 3X/week      PT Plan Current plan remains appropriate    Co-evaluation              AM-PAC PT "6 Clicks" Daily Activity  Outcome Measure  Difficulty turning over in bed  (including adjusting bedclothes, sheets and blankets)?: A Lot Difficulty moving from lying on back to sitting on the side of the bed? : A Lot Difficulty sitting down on and standing up from a chair with arms (e.g., wheelchair, bedside commode, etc,.)?: A Lot Help needed moving to and from a bed to chair (including a wheelchair)?: A Lot Help needed walking in hospital room?: A Lot Help needed climbing 3-5 steps with a railing? : Total 6 Click Score: 11    End of Session Equipment Utilized During Treatment: Gait belt Activity Tolerance: Patient limited by fatigue Patient left: in bed;with bed alarm set;with call bell/phone within reach Nurse Communication: Mobility status PT Visit Diagnosis: Muscle weakness (generalized) (M62.81);Difficulty in walking, not elsewhere classified (R26.2)     Time: 1405-1430 PT Time Calculation (min) (ACUTE ONLY): 25 min  Charges:  $Gait Training: 8-22 mins $Therapeutic Activity: 8-22 mins                    G Codes:       {Houa Ackert  PTA [facility]  Acute  Rehab Pager      [phone]

## 2018-04-16 NOTE — Progress Notes (Signed)
Referring Physician(s): Thomas,A  Supervising Physician: Malachy Moan  Patient Status:  [facility] - In-pt  Chief Complaint:  cholecystitis  Subjective: Pt without sig abd pain,N/V or drainage from GB drain site  Allergies: Patient has no known allergies.  Medications: Prior to Admission medications   Medication Sig Start Date End Date Taking? Authorizing Provider  aspirin 81 MG tablet Take 81 mg by mouth daily.   Yes [provider]  brimonidine (ALPHAGAN) 0.2 % ophthalmic solution Place 1 drop into both eyes 2 (two) times daily.    Yes [provider]  furosemide (LASIX) 40 MG tablet Take 1 tablet (40 mg total) by mouth 2 (two) times daily. 04/01/18 04/01/19 Yes Rinaldo Cloud, MD  glimepiride (AMARYL) 4 MG tablet Take 1 tablet (4 mg total) by mouth daily with breakfast. 02/02/18  Yes Buriev, Isaiah Serge, MD  losartan (COZAAR) 100 MG tablet Take 100 mg by mouth daily. 03/01/18  Yes [provider]  metoprolol succinate (TOPROL-XL) 50 MG 24 hr tablet Take 1 tablet (50 mg total) by mouth daily. Take with or immediately following a meal. 04/02/18  Yes Rinaldo Cloud, MD  Naproxen Sodium (ALEVE) 220 MG CAPS Take 440 mg by mouth daily as needed (headache).   Yes [provider]  potassium chloride SA (K-DUR,KLOR-CON) 20 MEQ tablet Take 1 tablet (20 mEq total) by mouth 2 (two) times daily. 04/01/18  Yes Rinaldo Cloud, MD  simvastatin (ZOCOR) 40 MG tablet Take 40 mg by mouth daily.   Yes [provider]  Sodium Chloride Flush (NORMAL SALINE FLUSH) 0.9 % SOLN Give 10 mLs by tube daily.  03/22/18  Yes [provider]  timolol (TIMOPTIC) 0.5 % ophthalmic solution Place 1 drop into both eyes 2 (two) times daily. 01/27/18  Yes [provider]     Vital Signs: BP (!) 155/70 (BP Location: Right Arm)   Pulse 73   Temp 98.5 F (36.9 C) (Oral)   Resp 18   Ht  (1.778 m)   Wt 187 lb 9.8 oz (85.1 kg)   SpO2 97%   BMI 26.92 kg/m    Physical Exam GB drain intact, insertion site ok, not sig tender, small amt turbid, beige colored fluid in bag; drain flushed ok with normal saline but could not aspirate back fluid  Imaging: Ct Abdomen Pelvis Wo Contrast  Result Date: 04/13/2018 CLINICAL DATA:  Hyperglycemia, nausea, vomiting. EXAM: CT ABDOMEN AND PELVIS WITHOUT CONTRAST TECHNIQUE: Multidetector CT imaging of the abdomen and pelvis was performed following the standard protocol without IV contrast. COMPARISON:  01/30/2018 FINDINGS: Lower chest: Moderate right and small left pleural effusions. Compressive atelectasis in the right lower lobe. Hepatobiliary: Pigtail drainage catheter is in the right upper quadrant the pigtail formed in the gallbladder fossa region. I believe this is likely within the gallbladder lumen, but this is difficult to confirm. The gallbladder is distended with marked wall thickening and surrounding inflammation, both worsening since prior study. At portions, the gallbladder wall is inseparable from the adjacent hepatic flexure of the colon. Pancreas: No focal abnormality or ductal dilatation. Spleen: No focal abnormality.  Normal size. Adrenals/Urinary Tract: Vascular calcifications in the renal arteries and hila bilaterally. No adrenal abnormality. No focal renal abnormality. No stones or hydronephrosis. Urinary bladder is unremarkable. Stomach/Bowel: Stomach, large and small bowel grossly unremarkable. Appendix is normal. Vascular/Lymphatic: Aortic and iliac calcifications. No evidence of aneurysm or adenopathy. Reproductive: No visible focal abnormality. Other: No free fluid or free air. There is a  gas and fluid collection noted anterior to the gallbladder, measuring approximately 5 by 1.6 cm which appears to be outside of the gallbladder, likely an abscess along the anterior gallbladder wall. Musculoskeletal: No acute bony abnormality. IMPRESSION: Cholecystostomy tube is in place, presumably within the gallbladder  lumen although this cannot be completely confirmed. There is worsening appearance of the gallbladder since prior CT with worsening wall thickening and surrounding inflammation. Probable extraluminal abscess anterior to the gallbladder fundus. Portions of the gallbladder wall are inseparable from the hepatic flexure of the colon. It is difficult to completely exclude fistula between the gallbladder and the colon. This would be better evaluated with contrast enhanced CT (both oral and IV) if possible. Moderate right pleural effusion and small left pleural effusion. Electronically Signed   By: Charlett Nose M.D.   On: 04/13/2018 18:36   Ir Catheter Tube Change  Result Date: 04/14/2018 INDICATION: 72 year old male with a history of acute cholecystitis. He was a poor operative candidate and had a percutaneous cholecystostomy tube placed on 01/31/2018. He recently presented with increasing abdominal pain and fever. CT imaging demonstrates progression of acute cholecystitis with tracking intra-abdominal infection. He presents for tube evaluation. EXAM: IR CATHETER TUBE CHANGE MEDICATIONS: None ANESTHESIA/SEDATION: None FLUOROSCOPY TIME:  Fluoroscopy Time: 3 minutes 12 seconds (41.7 mGy). COMPLICATIONS: None immediate. PROCEDURE: Informed written consent was obtained from the patient after a thorough discussion of the procedural risks, benefits and alternatives. All questions were addressed. Maximal Sterile Barrier Technique was utilized including caps, mask, sterile gowns, sterile gloves, sterile drape, hand hygiene and skin antiseptic. A timeout was performed prior to the initiation of the procedure. When the existing percutaneous drainage catheter was un bandaged, it was identified to be kinked several cm from the skin insertion site. The tube was unkinked before sterile prep and drape. A hand injection of contrast material was then performed. The gallbladder lumen is diffusely irregular. Several outpouchings are  present within the gallbladder wall consistent with areas of gallbladder wall breakdown. The cystic duct is patent. Contrast material enters the duodenum. Successful exchange and up size to a 12 [place] cook all-purpose drainage catheter. The catheter is formed with the locking loop in the gallbladder lumen. Aspiration yields approximately 20 mL of foul-smelling purulent material. The catheter was secured to the skin with an adhesive and suture based fixation device. IMPRESSION: 1. The existing 10 French percutaneous cholecystostomy tube was kinked. 2. Successful exchange for a new 12 French percutaneous cholecystostomy tube. Electronically Signed   By: Malachy Moan M.D.   On: 04/14/2018 11:28    Labs:  CBC: Recent Labs    04/13/18 1504 04/14/18 0417 04/15/18 0523 04/16/18 0435  WBC 13.3* 11.3* 10.2 10.0  HGB 9.5* 8.5* 8.3* 8.4*  HCT 28.3* 25.9* 25.8* 25.7*  PLT 283 262 272 265    COAGS: Recent Labs    01/31/18 0842  INR 1.01    BMP: Recent Labs    04/13/18 1504 04/14/18 0417 04/15/18 0523 04/16/18 0435  NA 126* 132* 134* 135  K 4.9 4.2 4.6 4.3  CL 92* 99* 98* 98*  CO2 GLUCOSE 516* 139* 160* 134*  BUN 22* 20 23* 22*  CALCIUM 8.9 8.8* 8.9 9.0  CREATININE 1.99* 1.82* 1.84* 1.72*  GFRNONAA 31* 35* 34* 37*  GFRAA 36* 40* 40* 43*    LIVER FUNCTION TESTS: Recent Labs    02/01/18 0529 03/27/18 1239 04/13/18 1504 04/14/18 0417  BILITOT 0.6 0.8 0.6 0.4  AST 29  $Remo LT 33 11* 13* 12*  ALKPHOS 91 135* 141* 131*  PROT 6.9 7.8 7.4 6.8  ALBUMIN 2.3* 2.6* 2.2* 2.0*    Assessment and Plan: Acute cholecystitis, s/p perc cholecystostomy on 01/31/2018; drain exchange and upsizing to 12 [place] on 04/14/2018; afebrile, blood pressure okay, WBC normal, hemoglobin 8.4, creatinine 1.72 down from 1.84; previous bile and recent blood cultures negative.  Case discussed with Dr. Archer Asa- no further intervention required at this time.  Recommend once daily  irrigation of drain with 5 to 10 cc sterile normal saline, output recording and dressing changes daily as outpatient; pt can call IR at [phone] or [phone] with any drain questions   Electronically Signed: D. Jeananne Rama, PA-C 04/16/2018, 3:02 PM   I spent a total of 15 minutes at the the patient's bedside AND on the patient's hospital floor or unit, greater than 50% of which was counseling/coordinating care for gallbladder drain    Patient ID: Jeremiah [name], male   DOB: 1946/03/03, 72 y.o.   MRN: [id number]

## 2018-04-16 NOTE — Progress Notes (Signed)
PROGRESS NOTE    Jeremiah [name]  UJW:[id number] DOB: [birth year] DOA: 04/13/2018 PCP: Rinaldo Cloud, MD      Brief Narrative:  Jeremiah [name] is a 72 y.o. M with HTN, DM, chronic diastolic CHF, CKD III, and peripheral vascular disease   Assessment & Plan:  Cholecystitis Perc tube replaced 5/22 by IR, upsized.  Intra-op contrast showed no fistula.  Culture still no growth.  He appears to be clinically improving. -Consult Gen Surg -Consult IR -Continue Zosyn 1 more day    Hypertension Peripheral vascular disease secondary prevention -Continue statin, metoprolol -Hold ARB  Chronic diastolic CHF Recent echo showed EF 50-55%, grade I DD. -Hold home ARB and furosemide until creatinine improves  AKI on CKD III Baseline Cr 1.4-1.7.  Peak 1.99 at admission, prerenal injury.  Improving again slightly today.    -Trend BMP -Hold ARB, diuretic  Diabetes Blood sugars well controlled Continue SSI  Hyponatremia Resolved  Anemia From chronic disease, mild.  no clinical bleeding.Hemoglobin stable     DVT prophylaxis: Lovenox Code Status: FULL Family Communication: None present MDM and disposition Plan: The below labs and imaging reports were reviewed and summarized above.  The patient's stable his appetite is improving, his strength is improving, he has had no fevers in the last 24 hours.  The patient was admitted with nausea and vomiting fever in the setting of a layer of the percutaneous cholecystostomy tube.  It is now replaced and he continues to improve we will continue antibiotics through the IV for 1 more day, then switch to Augmentin and likely home tomorrow.  Plan for 2 more weeks antibiotics, then follow up with [facility].      Consultants:   Gen Surg  IR  Procedures:   Perc tube replacement When the existing percutaneous drainage catheter was un bandaged, it was identified to be kinked several cm from the skin insertion site. The tube was unkinked before  sterile prep and drape. A hand injection of contrast material was then performed. The gallbladder lumen is diffusely irregular. Several outpouchings are present within the gallbladder wall consistent with areas of gallbladder wall breakdown. The cystic duct is patent. Contrast material enters the duodenum.  Successful exchange and up size to a 12 [place] cook all-purpose drainage catheter. The catheter is formed with the locking loop in the gallbladder lumen. Aspiration yields approximately 20 mL of foul-smelling purulent material. The catheter was secured to the skin with an adhesive and suture based fixation device.  IMPRESSION: 1. The existing 10 French percutaneous cholecystostomy tube was kinked. 2. Successful exchange for a new 12 French percutaneous cholecystostomy tube.   Antimicrobials:   Zosyn 5/21 >>    Subjective: Appetite improving.  No new fever.  No confusion.  Leg swelling improved.  No abdominal distension, vomiting.  NO drainage from perc site.    Objective: Vitals:   04/15/18 0530 04/15/18 1010 04/15/18 2206 04/16/18 0500  BP: (!) 119/53 (!) 120/52 (!) 139/54 (!) 133/57  Pulse: 81 78 75 66  Resp: Temp: 98.8 F (37.1 C)  99.4 F (37.4 C) 98.4 F (36.9 C)  TempSrc: Oral  Oral Oral  SpO2: 95%  95% 94%  Weight:    85.1 kg (187 lb 9.8 oz)  Height:        Intake/Output Summary (Last 24 hours) at 04/16/2018 1444 Last data filed at 04/16/2018 0923 Gross per 24 hour  Intake 590 ml  Output 950 ml  Net -360 ml  Filed Weights   04/13/18 1425 04/14/18 0500 04/16/18 0500  Weight: 86.2 kg (190 lb) 81.4 kg (179 lb 7.3 oz) 85.1 kg (187 lb 9.8 oz)    Examination: General appearance: Elderly adult male, lying in bed, easily arousable, no acute distress. HEENT: Anicteric, conjunctive are pink, lids and lashes normal.  No nasal deformity, discharge, or epistaxis.  Lips moist, dentures in place.  No oral lesions, OP moist, hearing normal. Skin: Skin  warm and dry, no suspicious rashes or lesions, no redness, swelling, induration around the cholecystostomy tube. Cardiac: Regular rate and rhythm, no murmurs, no pitting lower extremity edema. Respiratory: Normal respiratory rate and rhythm, lungs clear without rales or wheezes.   Abdomen: Abdomen soft, mild right-sided tenderness to palpation, without guarding, rebound, or rigidity.  No distention, no scars.   MSK: No deformities or effusions in the large joints of the upper lower extremities bilaterally. Neuro: Leaping but easily arousable, cranial nerves grossly normal, speech fluent, moves all extremities normal strength    Psych: Oriented to person, place, and time.  Attention normal, affect normal, judgment and insight appear normal.   Data Reviewed: I have personally reviewed following labs and imaging studies:  CBC: Recent Labs  Lab 04/13/18 1504 04/14/18 0417 04/15/18 0523 04/16/18 0435  WBC 13.3* 11.3* 10.2 10.0  HGB 9.5* 8.5* 8.3* 8.4*  HCT 28.3* 25.9* 25.8* 25.7*  MCV 80.9 80.7 81.1 81.3  PLT 283 262 272 265   Basic Metabolic Panel: Recent Labs  Lab 04/13/18 1504 04/14/18 0417 04/15/18 0523 04/16/18 0435  NA 126* 132* 134* 135  K 4.9 4.2 4.6 4.3  CL 92* 99* 98* 98*  CO2 GLUCOSE 516* 139* 160* 134*  BUN 22* 20 23* 22*  CREATININE 1.99* 1.82* 1.84* 1.72*  CALCIUM 8.9 8.8* 8.9 9.0   GFR: Estimated Creatinine Clearance: 38.9 mL/min (A) (by C-G formula based on SCr of 1.72 mg/dL (H)). Liver Function Tests: Recent Labs  Lab 04/13/18 1504 04/14/18 0417  AST 19 17  ALT 13* 12*  ALKPHOS 141* 131*  BILITOT 0.6 0.4  PROT 7.4 6.8  ALBUMIN 2.2* 2.0*   Recent Labs  Lab 04/13/18 1504  LIPASE 26   No results for input(s): AMMONIA in the last 168 hours. Coagulation Profile: No results for input(s): INR, PROTIME in the last 168 hours. Cardiac Enzymes: No results for input(s): CKTOTAL, CKMB, CKMBINDEX, TROPONINI in the last 168 hours. BNP (last 3  results) No results for input(s): PROBNP in the last 8760 hours. HbA1C: Recent Labs    04/16/18 0435  HGBA1C 8.9*   CBG: Recent Labs  Lab 04/15/18 1129 04/15/18 1655 04/15/18 2205 04/16/18 0741 04/16/18 1326  GLUCAP 226* 193* 152* 139* 152*   Lipid Profile: No results for input(s): CHOL, HDL, LDLCALC, TRIG, CHOLHDL, LDLDIRECT in the last 72 hours. Thyroid Function Tests: No results for input(s): TSH, T4TOTAL, FREET4, T3FREE, THYROIDAB in the last 72 hours. Anemia Panel: No results for input(s): VITAMINB12, FOLATE, FERRITIN, TIBC, IRON, RETICCTPCT in the last 72 hours. Urine analysis:    Component Value Date/Time   COLORURINE YELLOW 04/13/2018 1504   APPEARANCEUR CLEAR 04/13/2018 1504   LABSPEC 1.020 04/13/2018 1504   PHURINE 5.5 04/13/2018 1504   GLUCOSEU 250 (A) 04/13/2018 1504   HGBUR MODERATE (A) 04/13/2018 1504   BILIRUBINUR NEGATIVE 04/13/2018 1504   KETONESUR NEGATIVE 04/13/2018 1504   PROTEINUR 100 (A) 04/13/2018 1504   UROBILINOGEN 0.2 01/09/2009 1532   NITRITE NEGATIVE 04/13/2018 1504  LEUKOCYTESUR NEGATIVE 04/13/2018 1504   Sepsis Labs: (procalcitonin:4,lacticacidven:4)  ) Recent Results (from the past 240 hour(s))  Blood culture (routine x 2)     Status: None (Preliminary result)   Collection Time: 04/13/18  7:48 PM  Result Value Ref Range Status   Specimen Description   Final    BLOOD RIGHT WRIST Performed at [facility], [address], [place], [place] [address]    Special Requests   Final    BOTTLES DRAWN AEROBIC AND ANAEROBIC Blood Culture adequate volume Performed at [facility], [address], [place], [place] [address]    Culture   Final    NO GROWTH 3 DAYS Performed at [facility], [address] [place], [place] [address]    Report Status PENDING  Incomplete  Blood culture (routine x 2)     Status: None (Preliminary result)   Collection Time: 04/13/18  7:49 PM  Result Value  Ref Range Status   Specimen Description   Final    BLOOD RIGHT HAND Performed at [facility], [address] [place], [place] [address]    Special Requests   Final    BOTTLES DRAWN AEROBIC AND ANAEROBIC Blood Culture results may not be optimal due to an excessive volume of blood received in culture bottles Performed at [facility], [address] [place], [place] [address]    Culture   Final    NO GROWTH 3 DAYS Performed at [facility], [address], [place], [place] [address]    Report Status PENDING  Incomplete         Radiology Studies: No results found.      Scheduled Meds: . brimonidine  1 drop Both Eyes BID  . enoxaparin (LOVENOX) injection  30 mg Subcutaneous Q24H  . insulin aspart  0-15 Units Subcutaneous TID WC  . insulin aspart  0-5 Units Subcutaneous QHS  . metoprolol succinate  50 mg Oral Daily  . simvastatin  40 mg Oral Daily  . sodium chloride flush  5 mL Intracatheter Q8H  . timolol  1 drop Both Eyes BID   Continuous Infusions: . piperacillin-tazobactam (ZOSYN)  IV 3.375 g (04/16/18 1210)     LOS: 3 days    Time spent: 25 minutes    Alberteen Sam, MD [facility] 04/16/2018,  11:00 AM     Pager [phone] --- please page though AMION:  [email or web address] Password [email or web address] If 7PM-7AM, please contact night-coverage

## 2018-04-16 NOTE — Care Management Note (Signed)
Case Management Note  Patient Details  Name: Jeremiah [name] MRN: [id number] Date of Birth: [birth year]  Pt declining SNF placement as recommended by PT. This CM met with pt at bedside and spoke with wife on the phone. Choice offered for home health services and [facility] chosen. [facility] rep alerted of referral. Will need HHPT/RN orders at discharge.  Expected Discharge Date:  (unknown)               Expected Discharge Plan:     In-House Referral:     Discharge planning Services     Post Acute Care Choice:    Choice offered to:     DME Arranged:    DME Agency:     HH Arranged:   RN/PT [place] Agency:     Status of Service:     If discussed at [place] of Stay Meetings, dates discussed:    Additional CommentsLynnell Catalan, RN 04/16/2018, 10:24 AM  [phone]

## 2018-04-16 NOTE — Progress Notes (Signed)
Call made to PT office and spoke to [place] about PT demonstrating transfer techniques to family. Toniann Fail asked that I contact Lawson Fiscal, PT on this floor. Lori paged. She will see patient. Lina Sar, RN

## 2018-04-16 NOTE — Progress Notes (Signed)
CSW met with patient at bedside to discuss rehab at SNF. Patient declines SNF placement and plans to return home with his wife.  Patient open to home health services.  CSW signing off.   Nicole Sinclair, LCSWA, MSW Clinical Social Worker  [phone] 04/16/2018  11:57 AM 

## 2018-04-17 DIAGNOSIS — R739 Hyperglycemia, unspecified: Secondary | ICD-10-CM

## 2018-04-17 LAB — GLUCOSE, CAPILLARY: Glucose-Capillary: 131 mg/dL — ABNORMAL HIGH (ref 65–99)

## 2018-04-17 LAB — BASIC METABOLIC PANEL
Anion gap: 12 (ref 5–15)
BUN: 18 mg/dL (ref 6–20)
CO2: 24 mmol/L (ref 22–32)
Calcium: 8.8 mg/dL — ABNORMAL LOW (ref 8.9–10.3)
Chloride: 98 mmol/L — ABNORMAL LOW (ref 101–111)
Creatinine, Ser: 1.53 mg/dL — ABNORMAL HIGH (ref 0.61–1.24)
GFR calc Af Amer: 50 mL/min — ABNORMAL LOW (ref >60)
GFR calc non Af Amer: 43 mL/min — ABNORMAL LOW (ref >60)
Glucose, Bld: 132 mg/dL — ABNORMAL HIGH (ref 65–99)
Potassium: 4 mmol/L (ref 3.5–5.1)
Sodium: 134 mmol/L — ABNORMAL LOW (ref 135–145)

## 2018-04-17 LAB — CBC
HCT: 28.4 % — ABNORMAL LOW (ref 39.0–52.0)
Hemoglobin: 9.1 g/dL — ABNORMAL LOW (ref 13.0–17.0)
MCH: 26.5 pg (ref 26.0–34.0)
MCHC: 32 g/dL (ref 30.0–36.0)
MCV: 82.8 fL (ref 78.0–100.0)
Platelets: 253 10*3/uL (ref 150–400)
RBC: 3.43 MIL/uL — ABNORMAL LOW (ref 4.22–5.81)
RDW: 14.7 % (ref 11.5–15.5)
WBC: 9.4 10*3/uL (ref 4.0–10.5)

## 2018-04-17 MED ORDER — AMOXICILLIN-POT CLAVULANATE 875-125 MG PO TABS: 1.0000 | ORAL_TABLET | Freq: Two times a day (BID) | ORAL | 0 refills | Status: DC

## 2018-04-17 MED ORDER — HYDROCODONE-ACETAMINOPHEN 5-325 MG PO TABS: 1.0000 | ORAL_TABLET | ORAL | 0 refills | Status: DC | PRN

## 2018-04-17 NOTE — Discharge Summary (Signed)
Physician Discharge Summary  ANTE ARREDONDO ZOX:[id number] DOB: [birth year] DOA: 04/13/2018  PCP: Rinaldo Cloud, MD  Admit date: 04/13/2018 Discharge date: 04/17/2018  Recommendations for Outpatient Follow-up:  1. Needs OP management Per Dr. Carolynne Edouard Gen surgery 2. Follow post op wound instructions 3. cbc + diff 1 week  Follow-up Information    Griselda Miner, MD Follow up on 04/21/2018.   Specialty:  General Surgery Why:  9:40am, arrive by 9:25am for check-in Contact information: [address] [place] [address] [phone]        [facility] Follow up.   Specialty:  Home Health Services Contact information: [address] [place] [address] [phone]           .  Discharge Diagnoses:  1. Cholecystitis  Discharge Condition: improved Disposition:  D/c hopme  Diet recommendation: [per surgery  Filed Weights   04/13/18 1425 04/14/18 0500 04/16/18 0500  Weight: 86.2 kg (190 lb) 81.4 kg (179 lb 7.3 oz) 85.1 kg (187 lb 9.8 oz)    History of present illness:    Hospital Course:  72 DHF, dm, htn Recent HF admit [new 5/4--5/9] Cholecystitis admit 3/9-3/12  Admit for sepsis 2/2 to Extraluminal abscess--started on ZOsyn Patient stabilized, drain placed, IR consulted Gen surgery scheduling surgery June 6th [[pedning cardiac clearance/?Cath] per Dr. Sharyn Lull   Today's assessment: S: awake alert in nad O: Vitals:  Vitals:   04/16/18 2149 04/17/18 0537  BP: (!) 154/67 (!) 144/63  Pulse: 68 74  Resp: 18 18  Temp: 99 F (37.2 C) 98.4 F (36.9 C)  SpO2: 95% 97%    o eomi ncat in nad o cta b o abd soft nt nd drain working well o Neuro intact o No le edema o Smile symm o Cataract noted    Discharge Instructions  Discharge Instructions    Diet - low sodium heart healthy   Complete by:  As directed    Discharge instructions   Complete by:  As directed    Follow post-op instructions from surgeon Take  ibuprofen OTC 200 3 tabs q6 prn paibn--I will rx also a limited amount of opiates We will ge tHome health to come out and look at your drina and re-inforce management I have stopped your lasix and potassium for now You will need To continue your other medications We will have you follow with Dr. Marveen Reeks of Surgery for GB removal  As scheudled   Increase activity slowly   Complete by:  As directed      Allergies as of 04/17/2018   No Known Allergies     Medication List    STOP taking these medications   ALEVE 220 MG Caps Generic drug:  Naproxen Sodium   furosemide 40 MG tablet Commonly known as:  LASIX   Normal Saline Flush 0.9 % Soln   potassium chloride SA 20 MEQ tablet Commonly known as:  K-DUR,KLOR-CON     TAKE these medications   amoxicillin-clavulanate 875-125 MG tablet Commonly known as:  AUGMENTIN Take 1 tablet by mouth 2 (two) times daily for 10 days.   aspirin 81 MG tablet Take 81 mg by mouth daily.   brimonidine 0.2 % ophthalmic solution Commonly known as:  ALPHAGAN Place 1 drop into both eyes 2 (two) times daily.   glimepiride 4 MG tablet Commonly known as:  AMARYL Take 1 tablet (4 mg total) by mouth daily with breakfast.   HYDROcodone-acetaminophen 5-325 MG tablet Commonly known  as:  NORCO/VICODIN Take 1 tablet by mouth every 4 (four) hours as needed for moderate pain.   losartan 100 MG tablet Commonly known as:  COZAAR Take 100 mg by mouth daily.   metoprolol succinate 50 MG 24 hr tablet Commonly known as:  TOPROL-XL Take 1 tablet (50 mg total) by mouth daily. Take with or immediately following a meal.   simvastatin 40 MG tablet Commonly known as:  ZOCOR Take 40 mg by mouth daily.   timolol 0.5 % ophthalmic solution Commonly known as:  TIMOPTIC Place 1 drop into both eyes 2 (two) times daily.      No Known Allergies  The results of significant diagnostics from this hospitalization (including imaging, microbiology, ancillary and laboratory)  are listed below for reference.    Significant Diagnostic Studies: Ct Abdomen Pelvis Wo Contrast  Result Date: 04/13/2018 CLINICAL DATA:  Hyperglycemia, nausea, vomiting. EXAM: CT ABDOMEN AND PELVIS WITHOUT CONTRAST TECHNIQUE: Multidetector CT imaging of the abdomen and pelvis was performed following the standard protocol without IV contrast. COMPARISON:  01/30/2018 FINDINGS: Lower chest: Moderate right and small left pleural effusions. Compressive atelectasis in the right lower lobe. Hepatobiliary: Pigtail drainage catheter is in the right upper quadrant the pigtail formed in the gallbladder fossa region. I believe this is likely within the gallbladder lumen, but this is difficult to confirm. The gallbladder is distended with marked wall thickening and surrounding inflammation, both worsening since prior study. At portions, the gallbladder wall is inseparable from the adjacent hepatic flexure of the colon. Pancreas: No focal abnormality or ductal dilatation. Spleen: No focal abnormality.  Normal size. Adrenals/Urinary Tract: Vascular calcifications in the renal arteries and hila bilaterally. No adrenal abnormality. No focal renal abnormality. No stones or hydronephrosis. Urinary bladder is unremarkable. Stomach/Bowel: Stomach, large and small bowel grossly unremarkable. Appendix is normal. Vascular/Lymphatic: Aortic and iliac calcifications. No evidence of aneurysm or adenopathy. Reproductive: No visible focal abnormality. Other: No free fluid or free air. There is a gas and fluid collection noted anterior to the gallbladder, measuring approximately 5 by 1.6 cm which appears to be outside of the gallbladder, likely an abscess along the anterior gallbladder wall. Musculoskeletal: No acute bony abnormality. IMPRESSION: Cholecystostomy tube is in place, presumably within the gallbladder lumen although this cannot be completely confirmed. There is worsening appearance of the gallbladder since prior CT with  worsening wall thickening and surrounding inflammation. Probable extraluminal abscess anterior to the gallbladder fundus. Portions of the gallbladder wall are inseparable from the hepatic flexure of the colon. It is difficult to completely exclude fistula between the gallbladder and the colon. This would be better evaluated with contrast enhanced CT (both oral and IV) if possible. Moderate right pleural effusion and small left pleural effusion. Electronically Signed   By: Charlett Nose M.D.   On: 04/13/2018 18:36   Dg Chest 2 View  Result Date: 03/27/2018 CLINICAL DATA:  Dyspnea EXAM: CHEST - 2 VIEW COMPARISON:  01/09/2009 chest radiograph. FINDINGS: Slightly low lung volumes. Stable cardiomediastinal silhouette with normal heart size. No pneumothorax. Small bilateral pleural effusions. No overt pulmonary edema. Patchy bibasilar lung opacities, right greater than left. IMPRESSION: 1. Patchy bibasilar lung opacities, right greater than left, which could represent aspiration, pneumonia and/or atelectasis. Recommend follow-up chest radiographs to resolution. 2. Small bilateral pleural effusions. Electronically Signed   By: Delbert Phenix M.D.   On: 03/27/2018 11:49   Ir Catheter Tube Change  Result Date: 04/14/2018 INDICATION: 72 year old male with a history of acute cholecystitis. He was  a poor operative candidate and had a percutaneous cholecystostomy tube placed on 01/31/2018. He recently presented with increasing abdominal pain and fever. CT imaging demonstrates progression of acute cholecystitis with tracking intra-abdominal infection. He presents for tube evaluation. EXAM: IR CATHETER TUBE CHANGE MEDICATIONS: None ANESTHESIA/SEDATION: None FLUOROSCOPY TIME:  Fluoroscopy Time: 3 minutes 12 seconds (41.7 mGy). COMPLICATIONS: None immediate. PROCEDURE: Informed written consent was obtained from the patient after a thorough discussion of the procedural risks, benefits and alternatives. All questions were  addressed. Maximal Sterile Barrier Technique was utilized including caps, mask, sterile gowns, sterile gloves, sterile drape, hand hygiene and skin antiseptic. A timeout was performed prior to the initiation of the procedure. When the existing percutaneous drainage catheter was un bandaged, it was identified to be kinked several cm from the skin insertion site. The tube was unkinked before sterile prep and drape. A hand injection of contrast material was then performed. The gallbladder lumen is diffusely irregular. Several outpouchings are present within the gallbladder wall consistent with areas of gallbladder wall breakdown. The cystic duct is patent. Contrast material enters the duodenum. Successful exchange and up size to a 12 [place] cook all-purpose drainage catheter. The catheter is formed with the locking loop in the gallbladder lumen. Aspiration yields approximately 20 mL of foul-smelling purulent material. The catheter was secured to the skin with an adhesive and suture based fixation device. IMPRESSION: 1. The existing 10 French percutaneous cholecystostomy tube was kinked. 2. Successful exchange for a new 12 French percutaneous cholecystostomy tube. Electronically Signed   By: Malachy Moan M.D.   On: 04/14/2018 11:28    Microbiology: Recent Results (from the past 240 hour(s))  Blood culture (routine x 2)     Status: None (Preliminary result)   Collection Time: 04/13/18  7:48 PM  Result Value Ref Range Status   Specimen Description   Final    BLOOD RIGHT WRIST Performed at [facility], [address] [place], [place] [address]    Special Requests   Final    BOTTLES DRAWN AEROBIC AND ANAEROBIC Blood Culture adequate volume Performed at [facility], [address] [place], [place] [address]    Culture   Final    NO GROWTH 3 DAYS Performed at [facility], [address], [place], [place] [address]    Report Status PENDING  Incomplete   Blood culture (routine x 2)     Status: None (Preliminary result)   Collection Time: 04/13/18  7:49 PM  Result Value Ref Range Status   Specimen Description   Final    BLOOD RIGHT HAND Performed at [facility], [address], [place], [place] [address]    Special Requests   Final    BOTTLES DRAWN AEROBIC AND ANAEROBIC Blood Culture results may not be optimal due to an excessive volume of blood received in culture bottles Performed at [facility], [address] [place], [place] [address]    Culture   Final    NO GROWTH 3 DAYS Performed at [facility], [address] [place], [place] [address]    Report Status PENDING  Incomplete     Labs: Basic Metabolic Panel: Recent Labs  Lab 04/13/18 1504 04/14/18 0417 04/15/18 0523 04/16/18 0435 04/17/18 0404  NA 126* 132* 134* 135 134*  K 4.9 4.2 4.6 4.3 4.0  CL 92* 99* 98* 98* 98*  CO2 GLUCOSE 516* 139* 160* 134* 132*  BUN 22* 20 23* 22* 18  CREATININE 1.99* 1.82* 1.84* 1.72* 1.53*  CALCIUM 8.9 8.8* 8.9 9.0 8.8*   Liver Function Tests: Recent Labs  Lab 04/13/18 1504 04/14/18 0417  AST 19 17  ALT 13* 12*  ALKPHOS 141* 131*  BILITOT 0.6 0.4  PROT 7.4 6.8  ALBUMIN 2.2* 2.0*   Recent Labs  Lab 04/13/18 1504  LIPASE 26   No results for input(s): AMMONIA in the last 168 hours. CBC: Recent Labs  Lab 04/13/18 1504 04/14/18 0417 04/15/18 0523 04/16/18 0435 04/17/18 0404  WBC 13.3* 11.3* 10.2 10.0 9.4  HGB 9.5* 8.5* 8.3* 8.4* 9.1*  HCT 28.3* 25.9* 25.8* 25.7* 28.4*  MCV 80.9 80.7 81.1 81.3 82.8  PLT 283 262 272 265 253   Cardiac Enzymes: No results for input(s): CKTOTAL, CKMB, CKMBINDEX, TROPONINI in the last 168 hours. BNP: BNP (last 3 results) Recent Labs    03/27/18 1239 03/31/18 0519 03/31/18 1903  BNP 439.0* 526.8* 617.4*    ProBNP (last 3 results) No results for input(s): PROBNP in the last 8760 hours.  CBG: Recent Labs  Lab  04/16/18 0741 04/16/18 1326 04/16/18 1710 04/16/18 2149 04/17/18 0750  GLUCAP 139* 152* 100* 139* 131*    Active Problems:   PVD (peripheral vascular disease) (HCC)   Diabetes mellitus without complication (HCC)   Hypertension   Acute cholecystitis   Time coordinating discharge: 25  Signed:  Brendia Sacks, MD [facility] 04/17/2018, 8:55 AM

## 2018-04-17 NOTE — Care Management Important Message (Signed)
Important Message  Patient Details  Name: Jeremiah [name] MRN: [id number] Date of Birth: 11-28-1945   Medicare Important Message Given:  Yes    Elliot Cousin, RN 04/17/2018, 10:50 AM

## 2018-04-17 NOTE — Progress Notes (Signed)
Assessment unchanged. Pt and wife verbalized understanding of dc instructions including drain care, follow up care, and when to call the doctor. Script x 1 given as provided by MD. Home Health to follow up with pt pt next week. Discharged via wc to front entrance to meet wife and awaiting vehicle to carry home. Accompanied by NT.

## 2018-04-17 NOTE — Care Management Note (Signed)
Case Management Note  Patient Details  Name: Jeremiah [name] MRN: [id number] Date of Birth: 01-20-1946  Subjective/Objective:  Acute cholecystitis, CHF, DM, HTN                  Action/Plan: NCM spoke to pt and wife at bedside commode. Pt has RW at home. Pt HH arranged with [facility]. Notified to make aware. Contacted attending for [facility] orders with F2F.   Expected Discharge Date:  04/17/18               Expected Discharge Plan:  Home w Home Health Services  In-House Referral:  Clinical Social Work  Discharge planning Services  CM Consult  Post Acute Care Choice:  Home Health Choice offered to:  Patient  DME Arranged:  N/A DME Agency:  NA  HH Arranged:  PT, RN, Nurse's Aide, Social Work [employer] Agency:  [facility]  Status of Service:  Completed, signed off  If discussed at [employer], dates discussed:    Additional Comments:  Elliot Cousin, RN 04/17/2018, 10:50 AM

## 2018-04-17 NOTE — Progress Notes (Signed)
[facility] Office:  [phone] General Surgery Progress Note   LOS: 4 days  POD -     Chief Complaint: Abdominal pain  Assessment and Plan: 1.  Acute cholecystitis, s/p original perc chole drain 01-31-18  -s/p perc drain exchange and upsize on 5-22/2019 because the drain got kinked  -can convert to oral abx.  Would recommend at least 2 weeks of abx therapy.  He will follow up with Dr. Carolynne Edouard next Wednesday in our office to discuss new timing of surgery.  He was originally scheduled for surgery on June 6, but this timing may need to be changes.  If more or fewer abx therapy is wanted then it can be changed at that time.  On Zosyn (stopped today)  2.  DM 3.  Diastolic CHF 4.  HTN 5.  PVD 6.  DVT prophylaxis - Lovenox   Active Problems:   PVD (peripheral vascular disease) (HCC)   Diabetes mellitus without complication (HCC)   Hypertension   Acute cholecystitis  Subjective:  Feels good now.  Probably home today.  Objective:   Vitals:   04/16/18 2149 04/17/18 0537  BP: (!) 154/67 (!) 144/63  Pulse: 68 74  Resp: 18 18  Temp: 99 F (37.2 C) 98.4 F (36.9 C)  SpO2: 95% 97%     Intake/Output from previous day:  05/24 0701 - 05/25 0700 In: 930 [P.O.:820; IV Piggyback:100] Out: 500 [Urine:500]  Intake/Output this shift:  No intake/output data recorded.   Physical Exam:   General: WN AA M who is alert and oriented.    HEENT: Normal. Pupils equal. .   Lungs: Clear.   Abdomen: Soft.  Has BS.   Wound: Drain in the RUQ.  Nothing recorded the last 24 hours.   Lab Results:    Recent Labs    04/16/18 0435 04/17/18 0404  WBC 10.0 9.4  HGB 8.4* 9.1*  HCT 25.7* 28.4*  PLT 265 253    BMET   Recent Labs    04/16/18 0435 04/17/18 0404  NA 135 134*  K 4.3 4.0  CL 98* 98*  CO2 27 24  GLUCOSE 134* 132*  BUN 22* 18  CREATININE 1.72* 1.53*  CALCIUM 9.0 8.8*    PT/INR  No results for input(s): LABPROT, INR in the last 72 hours.  ABG  No results  for input(s): PHART, HCO3 in the last 72 hours.  Invalid input(s): PCO2, PO2   Studies/Results:  No results found.   Anti-infectives:   Anti-infectives (From admission, onward)   Start     Dose/Rate Route Frequency Ordered Stop   04/17/18 0000  amoxicillin-clavulanate (AUGMENTIN) 875-125 MG tablet     1 tablet Oral 2 times daily 04/17/18 0854 04/27/18 2359   04/14/18 0400  piperacillin-tazobactam (ZOSYN) IVPB 3.375 g     3.375 g 12.5 mL/hr over 240 Minutes Intravenous Every 8 hours 04/13/18 2140     04/13/18 1915  ceFEPIme (MAXIPIME) 1 g in sodium chloride 0.9 % 100 mL IVPB  Status:  Discontinued     1 g 200 mL/hr over 30 Minutes Intravenous  Once 04/13/18 1903 04/13/18 1908   04/13/18 1915  piperacillin-tazobactam (ZOSYN) IVPB 3.375 g     3.375 g 100 mL/hr over 30 Minutes Intravenous  Once 04/13/18 1909 04/13/18 2022      Ovidio Kin, MD, FACS Pager: [phone] [facility] Office: [phone] 04/17/2018

## 2018-04-18 LAB — CULTURE, BLOOD (ROUTINE X 2)
Culture: NO GROWTH
Culture: NO GROWTH
Special Requests: ADEQUATE

## 2018-04-20 ENCOUNTER — Inpatient Hospital Stay (HOSPITAL_COMMUNITY)
Admission: RE | Admit: 2018-04-20 | Discharge: 2018-04-20 | Disposition: A | Discharge status: Home / Self Care | Payer: Medicare Other | Source: Ambulatory Visit

## 2018-04-20 NOTE — Pre-Procedure Instructions (Signed)
Jeremiah [name] [facility]  04/20/2018      [facility] - [place], [place] - [address] [place] [address] Phone: [phone] Fax: [phone]    Your procedure is scheduled on Thursday June 6.  Report to [facility] Admitting at 5:30 A.M.             (posted surgery time 7:30a - 9:30a)   Call this number if you have problems the morning of surgery:  [phone]   Remember:  No food after midnight, Wednesday.    **DRINK Ensure pre-surgery drink prior to leaving home the morning of surgery**    Take these medicines the morning of surgery with A SIP OF WATER:  Metoprolol (Toprol XL) Eye drops   7 days prior to surgery STOP taking any Aleve, Naproxen, Ibuprofen, Motrin, Advil, Goody's, BC's, all herbal medications, fish oil, and all vitamins  **FOLLOW your surgeon's instructions on stopping Aspirin. If no instructions were given, please call your surgeon's office**      Do not wear jewelry - no rings or watches.  Do not wear lotions, colognes or deodorant.             Men may shave face and neck.  Do not bring valuables to the hospital.  [facility] is not responsible for any belongings or valuables.  Contacts, dentures or bridgework may not be worn into surgery.  Leave your suitcase in the car.  After surgery it may be brought to your room.  For patients admitted to the hospital, discharge time will be determined by your treatment team.  Patients discharged the day of surgery will not be allowed to drive home, and will need someone to stay with you for the first 24 hrs.    Special instructions:    [place]- Preparing For Surgery  Before surgery, you can play an important role. Because skin is not sterile, your skin needs to be as free of germs as possible. You can reduce the number of germs on your skin by washing with CHG (chlorahexidine gluconate) Soap before surgery.  CHG is an antiseptic cleaner which kills  germs and bonds with the skin to continue killing germs even after washing.    Oral Hygiene is also important to reduce your risk of infection.  Remember - BRUSH YOUR TEETH THE MORNING OF SURGERY WITH YOUR REGULAR TOOTHPASTE  Please do not use if you have an allergy to CHG or antibacterial soaps. If your skin becomes reddened/irritated stop using the CHG.  Do not shave (including legs and underarms) for at least 48 hours prior to first CHG shower. It is OK to shave your face.  Please follow these instructions carefully.   1. Shower the NIGHT BEFORE SURGERY and the MORNING OF SURGERY with CHG.   2. If you chose to wash your hair, wash your hair first as usual with your normal shampoo.  3. After you shampoo, rinse your hair and body thoroughly to remove the shampoo.  4. Use CHG as you would any other liquid soap. You can apply CHG directly to the skin and wash gently with a scrungie or a clean washcloth.   5. Apply the CHG Soap to your body ONLY FROM THE NECK DOWN.  Do not use on open wounds or open sores. Avoid contact with your eyes, ears, mouth and genitals (private parts). Wash Face and genitals (private parts)  with your normal soap.  6. Wash thoroughly, paying  special attention to the area where your surgery will be performed.  7. Thoroughly rinse your body with warm water from the neck down.  8. DO NOT shower/wash with your normal soap after using and rinsing off the CHG Soap.  9. Pat yourself dry with a CLEAN TOWEL.  10. Wear CLEAN PAJAMAS to bed the night before surgery, wear comfortable clothes the morning of surgery  11. Place CLEAN SHEETS on your bed the night of your first shower and DO NOT SLEEP WITH PETS.    Day of Surgery:  Do not apply any deodorants/lotions.  Please wear clean clothes to the hospital/surgery center.    Remember to brush your teeth WITH YOUR REGULAR TOOTHPASTE.    Please read over the following fact sheets that you were given. Coughing and  Deep Breathing, MRSA Information and Surgical Site Infection Prevention

## 2018-04-26 ENCOUNTER — Encounter (HOSPITAL_COMMUNITY): Payer: Self-pay

## 2018-04-26 ENCOUNTER — Other Ambulatory Visit: Payer: Self-pay

## 2018-04-26 ENCOUNTER — Emergency Department (HOSPITAL_COMMUNITY)
Admission: EM | Admit: 2018-04-26 | Discharge: 2018-04-27 | Disposition: A | Discharge status: Home / Self Care | Payer: Medicare Other | Attending: Emergency Medicine | Admitting: Emergency Medicine

## 2018-04-26 DIAGNOSIS — A0472 Enterocolitis due to Clostridium difficile, not specified as recurrent: Secondary | ICD-10-CM | POA: Insufficient documentation

## 2018-04-26 DIAGNOSIS — Z7984 Long term (current) use of oral hypoglycemic drugs: Secondary | ICD-10-CM | POA: Diagnosis not present

## 2018-04-26 DIAGNOSIS — I10 Essential (primary) hypertension: Secondary | ICD-10-CM | POA: Insufficient documentation

## 2018-04-26 DIAGNOSIS — Z7982 Long term (current) use of aspirin: Secondary | ICD-10-CM | POA: Insufficient documentation

## 2018-04-26 DIAGNOSIS — E119 Type 2 diabetes mellitus without complications: Secondary | ICD-10-CM | POA: Insufficient documentation

## 2018-04-26 DIAGNOSIS — Z87891 Personal history of nicotine dependence: Secondary | ICD-10-CM | POA: Diagnosis not present

## 2018-04-26 DIAGNOSIS — R197 Diarrhea, unspecified: Secondary | ICD-10-CM | POA: Diagnosis present

## 2018-04-26 LAB — DIFFERENTIAL
Abs Immature Granulocytes: 0.1 10*3/uL (ref 0.0–0.1)
Basophils Absolute: 0 10*3/uL (ref 0.0–0.1)
Basophils Relative: 0 %
Eosinophils Absolute: 0.3 10*3/uL (ref 0.0–0.7)
Eosinophils Relative: 2 %
Immature Granulocytes: 1 %
Lymphocytes Relative: 18 %
Lymphs Abs: 2.5 10*3/uL (ref 0.7–4.0)
Monocytes Absolute: 1.2 10*3/uL — ABNORMAL HIGH (ref 0.1–1.0)
Monocytes Relative: 8 %
Neutro Abs: 10 10*3/uL — ABNORMAL HIGH (ref 1.7–7.7)
Neutrophils Relative %: 71 %

## 2018-04-26 LAB — COMPREHENSIVE METABOLIC PANEL
ALT: 8 U/L — ABNORMAL LOW (ref 17–63)
AST: 14 U/L — ABNORMAL LOW (ref 15–41)
Albumin: 2.1 g/dL — ABNORMAL LOW (ref 3.5–5.0)
Alkaline Phosphatase: 88 U/L (ref 38–126)
Anion gap: 10 (ref 5–15)
BUN: 12 mg/dL (ref 6–20)
CO2: 26 mmol/L (ref 22–32)
Calcium: 8.3 mg/dL — ABNORMAL LOW (ref 8.9–10.3)
Chloride: 98 mmol/L — ABNORMAL LOW (ref 101–111)
Creatinine, Ser: 1.66 mg/dL — ABNORMAL HIGH (ref 0.61–1.24)
GFR calc Af Amer: 45 mL/min — ABNORMAL LOW (ref >60)
GFR calc non Af Amer: 39 mL/min — ABNORMAL LOW (ref >60)
Glucose, Bld: 155 mg/dL — ABNORMAL HIGH (ref 65–99)
Potassium: 3.3 mmol/L — ABNORMAL LOW (ref 3.5–5.1)
Sodium: 134 mmol/L — ABNORMAL LOW (ref 135–145)
Total Bilirubin: 0.4 mg/dL (ref 0.3–1.2)
Total Protein: 6.6 g/dL (ref 6.5–8.1)

## 2018-04-26 LAB — C DIFFICILE QUICK SCREEN W PCR REFLEX
C Diff antigen: POSITIVE — AB
C Diff toxin: NEGATIVE

## 2018-04-26 LAB — CBC
HCT: 30.5 % — ABNORMAL LOW (ref 39.0–52.0)
Hemoglobin: 9.8 g/dL — ABNORMAL LOW (ref 13.0–17.0)
MCH: 25.8 pg — ABNORMAL LOW (ref 26.0–34.0)
MCHC: 32.1 g/dL (ref 30.0–36.0)
MCV: 80.3 fL (ref 78.0–100.0)
Platelets: 289 10*3/uL (ref 150–400)
RBC: 3.8 MIL/uL — ABNORMAL LOW (ref 4.22–5.81)
RDW: 14.7 % (ref 11.5–15.5)
WBC: 14.1 10*3/uL — ABNORMAL HIGH (ref 4.0–10.5)

## 2018-04-26 LAB — LIPASE, BLOOD: Lipase: 25 U/L (ref 11–51)

## 2018-04-26 LAB — I-STAT CG4 LACTIC ACID, ED: Lactic Acid, Venous: 1.41 mmol/L (ref 0.5–1.9)

## 2018-04-26 NOTE — ED Notes (Signed)
Patient had episode of diarrhea while waiting for a room.  Patient cleaned up and brought to room in wheelchair.

## 2018-04-26 NOTE — ED Provider Notes (Signed)
[facility] EMERGENCY [facility]MENT Provider Note   CSN: [id number] Arrival date & time: 04/26/18  1429   History   Chief Complaint Chief Complaint  Patient presents with  . Diarrhea    HPI Jeremiah [name] is a 72 y.o. male.  H/o cholecystitis c/b sepsis with IV abx, percutaneous biliary drain, d/c'd home on augmentin who presents with malodorous and copious diarrhea x 4 days.   The history is provided by the patient, a relative and medical records.  Diarrhea   This is a new problem. The current episode started more than 2 days ago. The problem occurs 2 to 4 times per day. The problem has been gradually worsening. The stool consistency is described as watery and mucous. There has been no fever. Pertinent negatives include no abdominal pain, no vomiting, no chills, no arthralgias and no cough. Associated symptoms comments: Anorexia. He has tried Brewing technologistKaeopectate (peptobismol) for the symptoms. The treatment provided no relief. His past medical history is significant for recent abdominal surgery. His past medical history does not include irritable bowel syndrome or inflammatory bowel disease.    Past Medical History:  Diagnosis Date  . Arthritis   . Diabetes mellitus without complication (HCC)   . Hypertension   . Peripheral vascular disease [facility](HCC)     Patient Active Problem List   Diagnosis Date Noted  . Acute systolic heart failure (HCC) 03/27/2018  . Diabetes mellitus without complication (HCC) 01/30/2018  . Hypertension 01/30/2018  . Acute cholecystitis 01/30/2018  . AKI (acute kidney injury) (HCC) 01/30/2018  . Normocytic anemia 01/30/2018  . PVD (peripheral vascular disease) (HCC) 09/05/2014    Past Surgical History:  Procedure Laterality Date  . IR CATHETER TUBE CHANGE  04/14/2018  . IR CHOLANGIOGRAM EXISTING TUBE  03/17/2018  . IR PERC CHOLECYSTOSTOMY  01/31/2018  . IR RADIOLOGIST EVAL & MGMT  03/02/2018        Home Medications    Prior to Admission  medications   Medication Sig Start Date End Date Taking? Authorizing Provider  aspirin 81 MG tablet Take 81 mg by mouth daily.   Yes [provider]  brimonidine (ALPHAGAN) 0.2 % ophthalmic solution Place 1 drop into both eyes 2 (two) times daily.    Yes [provider]  glimepiride (AMARYL) 4 MG tablet Take 1 tablet (4 mg total) by mouth daily with breakfast. Patient taking differently: Take 4 mg by mouth 2 (two) times daily.  02/02/18  Yes Buriev, Isaiah SergeUlugbek N, MD  HYDROcodone-acetaminophen (NORCO/VICODIN) 5-325 MG tablet Take 1 tablet by mouth every 4 (four) hours as needed for moderate pain. 04/17/18  Yes Rhetta MuraSamtani, Jai-Gurmukh, MD  losartan (COZAAR) 100 MG tablet Take 100 mg by mouth daily. 03/01/18  Yes [provider]  metoprolol succinate (TOPROL-XL) 50 MG 24 hr tablet Take 1 tablet (50 mg total) by mouth daily. Take with or immediately following a meal. 04/02/18  Yes Rinaldo CloudHarwani, Mohan, MD  simvastatin (ZOCOR) 40 MG tablet Take 40 mg by mouth daily.   Yes [provider]  timolol (TIMOPTIC) 0.5 % ophthalmic solution Place 1 drop into both eyes 2 (two) times daily. 01/27/18  Yes [provider]  vancomycin (VANCOCIN) 50 mg/mL oral solution Take 2.5 mLs (125 mg total) by mouth every 6 (six) hours for 10 days. 04/27/18 05/07/18  Diannia Ruderucker, Corrin Hingle, MD    Family History Family History  Problem Relation Age of Onset  . Diabetes Mother   . Hypertension Mother   . Heart disease Father   .  Heart attack Father   . Diabetes Sister   . Hypertension Sister   . Diabetes Brother   . Heart disease Brother   . Hypertension Brother   . Heart attack Brother     Social History Social History   Tobacco Use  . Smoking status: Former Smoker    Last attempt to quit: 11/25/1983    Years since quitting: 34.4  . Smokeless tobacco: Never Used  Substance Use Topics  . Alcohol use: No  . Drug use: No     Allergies   Patient has no known allergies.   Review of  Systems Review of Systems  Constitutional: Positive for appetite change (decreased) and fatigue. Negative for chills and fever.  HENT: Negative for ear pain and sore throat.   Eyes: Negative for pain and visual disturbance.  Respiratory: Negative for cough and shortness of breath.   Cardiovascular: Negative for chest pain and palpitations.  Gastrointestinal: Positive for diarrhea. Negative for abdominal pain, blood in stool, nausea and vomiting.  Genitourinary: Negative for dysuria and hematuria.  Musculoskeletal: Negative for arthralgias and back pain.  Skin: Negative for color change and rash.  Neurological: Negative for seizures and syncope.  All other systems reviewed and are negative.    Physical Exam Updated Vital Signs BP (!) 144/62   Pulse (!) 57   Temp 98 F (36.7 C) (Oral)   Resp 16   Ht 5\' 10"  (1.778 m)   Wt 83.9 kg (185 lb)   SpO2 100%   BMI 26.54 kg/m   Physical Exam  Constitutional: He appears well-developed and well-nourished.  HENT:  Head: Normocephalic and atraumatic.  Mouth/Throat: Oropharynx is clear and moist.  Eyes: Conjunctivae and EOM are normal.  Neck: Neck supple.  Cardiovascular: Normal rate, regular rhythm and intact distal pulses.  No murmur heard. Pulmonary/Chest: Effort normal and breath sounds normal. No stridor. No respiratory distress.  Abdominal: Soft. He exhibits no distension. There is no tenderness. There is no guarding.  Musculoskeletal: He exhibits no edema.  Neurological: He is alert.  Skin: Skin is warm and dry. Capillary refill takes less than 2 seconds. No pallor.  Psychiatric: He has a normal mood and affect.  Nursing note and vitals reviewed.    ED Treatments / Results  Labs (all labs ordered are listed, but only abnormal results are displayed) Labs Reviewed  C DIFFICILE QUICK SCREEN W PCR REFLEX - Abnormal; Notable for the following components:      Result Value   C Diff antigen POSITIVE (*)    All other components  within normal limits  CLOSTRIDIUM DIFFICILE BY PCR, REFLEXED - Abnormal; Notable for the following components:   Toxigenic C. Difficile by PCR POSITIVE (*)    All other components within normal limits  COMPREHENSIVE METABOLIC PANEL - Abnormal; Notable for the following components:   Sodium 134 (*)    Potassium 3.3 (*)    Chloride 98 (*)    Glucose, Bld 155 (*)    Creatinine, Ser 1.66 (*)    Calcium 8.3 (*)    Albumin 2.1 (*)    AST 14 (*)    ALT 8 (*)    GFR calc non Af Amer 39 (*)    GFR calc Af Amer 45 (*)    All other components within normal limits  CBC - Abnormal; Notable for the following components:   WBC 14.1 (*)    RBC 3.80 (*)    Hemoglobin 9.8 (*)    HCT 30.5 (*)  MCH 25.8 (*)    All other components within normal limits  DIFFERENTIAL - Abnormal; Notable for the following components:   Neutro Abs 10.0 (*)    Monocytes Absolute 1.2 (*)    All other components within normal limits  LIPASE, BLOOD  I-STAT CG4 LACTIC ACID, ED    EKG None  Radiology No results found.  Procedures Procedures (including critical care time)  Medications Ordered in ED Medications  vancomycin (VANCOCIN) 50 mg/mL oral solution 125 mg (has no administration in time range)     Initial Impression / Assessment and Plan / ED Course  I have reviewed the triage vital signs and the nursing notes.  Pertinent labs & imaging results that were available during my care of the patient were reviewed by me and considered in my medical decision making (see chart for details).     Jeremiah [name] is a 72 y.o. male with PMHx of  who p/w cholecystitis c/b sepsis with IV abx, percutaneous biliary drain, d/c'd home on augmentin who presents with malodorous and copious diarrhea x 4 days. Reviewed and confirmed nursing documentation for past medical history, family history, social history. VS afebrile, wnl. Exam remarkable for benign abd. Ddx includes c diff, viral illness. Not concern for  diverticulitis. No indication for emergent imaging at this time.   Lactic acid wnl. Lipase wnl. CMP with mild hypokalemia 3.3, Cr at baseline 1.66, otherwise unremarkable. CBC with leukocytosis 14.1, stable normocytic anemia with hgb 9.8, otherwise unremarkable. C diff + for antigen and toxin, c/w C diff infection. First dose oral vancomycin given here. Augmentin scheduled to be stopped tomorrow, advised pt to stop 1 day prematurely given new C diff infection.   Old records reviewed. Labs reviewed by me and used in the medical decision making. D/c home in stable condition, return precautions discussed. Patient agreeable with plan for d/c home.   Final Clinical Impressions(s) / ED Diagnoses   Final diagnoses:  C. difficile diarrhea    ED Discharge Orders        Ordered    vancomycin (VANCOCIN) 50 mg/mL oral solution  Every 6 hours     04/27/18 0028       Diannia Ruder, MD 04/27/18 9147    Clarene Duke, Ambrose Finland, MD 04/29/18 1610

## 2018-04-26 NOTE — ED Provider Notes (Signed)
Patient placed in Quick Look pathway, seen and evaluated   Chief Complaint: diarrhea  HPI:   Pt presenting to the ED via EMS with complaint of diarrhea x3 days. He was recently admitted on 04/13/2018 for sepsis secondary to extraluminal abscess next to his gallbladder.  Patient w percutaneous cholecystostomy drain in place.  He was discharged on 04/17/2018 prescribed Augmentin.  Per discharge note, patient scheduled for cholecystectomy on June 6 pending cardiac clearance.  States 3 days ago he began having uncontrollable diarrhea, about 5 episodes per day.  Nonbloody.  Denies abdominal pain, nausea, vomiting, fevers or chills.  States he feels generally weak and is unable to stand on his own.  ROS: + diarrhea, - F/C, - abd pain, -N/V Physical Exam:   Gen: No distress  Neuro: Awake and Alert  Skin: Warm, pale    Focused Exam: Patient is pale appearing.  Abdomen is soft and nontender.  Afebrile.   Initiation of care has begun. The patient has been counseled on the process, plan, and necessity for staying for the completion/evaluation, and the remainder of the medical screening examination    Kitrina Maurin, [place] N, PA-C 04/26/18 1457    Arby BarrettePfeiffer, Marcy, MD 05/05/18 [phone]

## 2018-04-26 NOTE — ED Triage Notes (Signed)
Pt presents via [facility] for evaluation of diarrhea x 3 days. Pt given amoxicillin for gallbladder issues 1 week ago. Pt reports supposed to have surgery on Thursday. No abd pain, no N/V. Seems to have started diarrhea when starting antibiotic.

## 2018-04-27 LAB — CLOSTRIDIUM DIFFICILE BY PCR, REFLEXED: Toxigenic C. Difficile by PCR: POSITIVE — AB

## 2018-04-27 MED ORDER — VANCOMYCIN 50 MG/ML ORAL SOLUTION
125.0000 mg | Freq: Once | ORAL | Status: AC
  Administered 2018-04-27: 125 mg via ORAL
  Filled 2018-04-27: qty 2.5

## 2018-04-27 MED ORDER — VANCOMYCIN 50 MG/ML ORAL SOLUTION: 125.0000 mg | Freq: Four times a day (QID) | ORAL | 0 refills | Status: AC

## 2018-04-27 MED FILL — FIRVANQ 50 MG/ML SOLN: 50 | 10 days supply | Qty: 150 | Fill #0

## 2018-04-27 NOTE — ED Notes (Signed)
Patient Alert and oriented to baseline. Stable and ambulatory to baseline. Patient verbalized understanding of the discharge instructions.  Patient belongings were taken by the patient.   

## 2018-04-28 ENCOUNTER — Inpatient Hospital Stay (HOSPITAL_COMMUNITY): Admission: RE | Admit: 2018-04-28 | Payer: Medicare Other | Source: Ambulatory Visit

## 2018-04-29 ENCOUNTER — Ambulatory Visit (HOSPITAL_COMMUNITY): Admission: RE | Admit: 2018-04-29 | Payer: Medicare Other | Source: Ambulatory Visit | Admitting: General Surgery

## 2018-04-29 ENCOUNTER — Encounter (HOSPITAL_COMMUNITY): Admission: RE | Payer: Self-pay | Source: Ambulatory Visit

## 2018-04-29 ENCOUNTER — Ambulatory Visit: Payer: Self-pay | Admitting: General Surgery

## 2018-04-29 SURGERY — LAPAROSCOPIC CHOLECYSTECTOMY WITH INTRAOPERATIVE CHOLANGIOGRAM
Anesthesia: General

## 2018-05-14 ENCOUNTER — Encounter (HOSPITAL_COMMUNITY)
Admission: RE | Admit: 2018-05-14 | Discharge: 2018-05-14 | Disposition: A | Discharge status: Home / Self Care | Payer: Medicare Other | Source: Ambulatory Visit | Attending: Cardiology | Admitting: Cardiology

## 2018-05-14 DIAGNOSIS — R079 Chest pain, unspecified: Secondary | ICD-10-CM | POA: Insufficient documentation

## 2018-05-14 MED ORDER — HYDRALAZINE HCL 20 MG/ML IJ SOLN
INTRAMUSCULAR | Status: AC
  Filled 2018-05-14: qty 1

## 2018-05-14 MED ORDER — TECHNETIUM TC 99M TETROFOSMIN IV KIT
30.0000 | PACK | Freq: Once | INTRAVENOUS | Status: AC | PRN
  Administered 2018-05-14: 30 via INTRAVENOUS

## 2018-05-14 MED ORDER — REGADENOSON 0.4 MG/5ML IV SOLN
INTRAVENOUS | Status: AC
  Administered 2018-05-14: 0.4 mg via INTRAVENOUS
  Filled 2018-05-14: qty 5

## 2018-05-14 MED ORDER — TECHNETIUM TC 99M TETROFOSMIN IV KIT
10.0000 | PACK | Freq: Once | INTRAVENOUS | Status: AC | PRN
  Administered 2018-05-14: 10 via INTRAVENOUS

## 2018-05-14 MED ORDER — HYDRALAZINE HCL 20 MG/ML IJ SOLN
10.0000 mg | Freq: Once | INTRAMUSCULAR | Status: AC
  Administered 2018-05-14: 10 mg via INTRAVENOUS

## 2018-05-14 MED ORDER — REGADENOSON 0.4 MG/5ML IV SOLN
0.4000 mg | Freq: Once | INTRAVENOUS | Status: AC
  Administered 2018-05-14: 0.4 mg via INTRAVENOUS

## 2018-05-14 NOTE — Pre-Procedure Instructions (Addendum)
[facility]or Ocho[facility]Wal[facility] L [facility] Center[facility]  05/14/2018      [facility] [place] - [address] [place] [id number] Phone: [phone] Fax: [phone]    Your procedure is scheduled on June 28  [facility] Admitting at 0830 A.M.  Call this number if you have problems the morning of surgery:  [phone]   Remember:  NOTHING TO EAT OR DRINK AFTER MIDNIGHT    Take these medicines the morning of surgery with A SIP OF WATER  EYE DROPS IF NEEDED HYDROcodone-acetaminophen (NORCO/VICODIN) metoprolol succinate (TOPROL-XL)   7 days prior to surgery STOP taking any Aspirin(unless otherwise instructed by your surgeon), Aleve, Naproxen, Ibuprofen, Motrin, Advil, Goody's, BC's, all herbal medications, fish oil, and all vitamins  Follow your doctors instructions regarding your Aspirin.  If no instructions were given by your doctor, then you will need to call the prescribing office office to get instructions.     WHAT DO I DO ABOUT MY DIABETES MEDICATION?  THE DAY BEFORE SURGERY DO NOT TAKE EVENING DOSE OF glimepiride (AMARYL)  . Do not take oral diabetes medicines (pills) the morning of surgery. glimepiride (AMARYL)   How to Manage Your Diabetes Before and After Surgery  Why is it important to control my blood sugar before and after surgery? . Improving blood sugar levels before and after surgery helps healing and can limit problems. . A way of improving blood sugar control is eating a healthy diet by: o  Eating less sugar and carbohydrates o  Increasing activity/exercise o  Talking with your doctor about reaching your blood sugar goals . High blood sugars (greater than 180 mg/dL) can raise your risk of infections and slow your recovery, so you will need to focus on controlling your diabetes during the weeks before surgery. . Make sure that the doctor who takes care of your diabetes knows about your planned surgery including the  date and location.  How do I manage my blood sugar before surgery? . Check your blood sugar at least 4 times a day, starting 2 days before surgery, to make sure that the level is not too high or low. o Check your blood sugar the morning of your surgery when you wake up and every 2 hours until you get to the Short Stay unit. . If your blood sugar is less than 70 mg/dL, you will need to treat for low blood sugar: o Do not take insulin. o Treat a low blood sugar (less than 70 mg/dL) with  cup of clear juice (cranberry or apple), 4 glucose tablets, OR glucose gel. o Recheck blood sugar in 15 minutes after treatment (to make sure it is greater than 70 mg/dL). If your blood sugar is not greater than 70 mg/dL on recheck, call [phone] for further instructions. . Report your blood sugar to the short stay nurse when you get to Short Stay.  . If you are admitted to the hospital after surgery: o Your blood sugar will be checked by the staff and you will probably be given insulin after surgery (instead of oral diabetes medicines) to make sure you have good blood sugar levels. o The goal for blood sugar control after surgery is 80-180 mg/dL.    Do not wear jewelry  Do not wear lotions, powders, or COLGONE, or deodorant.  Men may shave face and neck.  Do not bring valuables to the hospital.  [facility] is not responsible for any belongings  or valuables.  Contacts, dentures or bridgework may not be worn into surgery.  Leave your suitcase in the car.  After surgery it may be brought to your room.  For patients admitted to the hospital, discharge time will be determined by your treatment team.  Patients discharged the day of surgery will not be allowed to drive home.    Special instructions:   [place]- Preparing For Surgery  Before surgery, you can play an important role. Because skin is not sterile, your skin needs to be as free of germs as possible. You can reduce the number of germs on  your skin by washing with CHG (chlorahexidine gluconate) Soap before surgery.  CHG is an antiseptic cleaner which kills germs and bonds with the skin to continue killing germs even after washing.    Oral Hygiene is also important to reduce your risk of infection.  Remember - BRUSH YOUR TEETH THE MORNING OF SURGERY WITH YOUR REGULAR TOOTHPASTE  Please do not use if you have an allergy to CHG or antibacterial soaps. If your skin becomes reddened/irritated stop using the CHG.  Do not shave (including legs and underarms) for at least 48 hours prior to first CHG shower. It is OK to shave your face.  Please follow these instructions carefully.   1. Shower the NIGHT BEFORE SURGERY and the MORNING OF SURGERY with CHG.   2. If you chose to wash your hair, wash your hair first as usual with your normal shampoo.  3. After you shampoo, rinse your hair and body thoroughly to remove the shampoo.  4. Use CHG as you would any other liquid soap. You can apply CHG directly to the skin and wash gently with a scrungie or a clean washcloth.   5. Apply the CHG Soap to your body ONLY FROM THE NECK DOWN.  Do not use on open wounds or open sores. Avoid contact with your eyes, ears, mouth and genitals (private parts). Wash Face and genitals (private parts)  with your normal soap.  6. Wash thoroughly, paying special attention to the area where your surgery will be performed.  7. Thoroughly rinse your body with warm water from the neck down.  8. DO NOT shower/wash with your normal soap after using and rinsing off the CHG Soap.  9. Pat yourself dry with a CLEAN TOWEL.  10. Wear CLEAN PAJAMAS to bed the night before surgery, wear comfortable clothes the morning of surgery  11. Place CLEAN SHEETS on your bed the night of your first shower and DO NOT SLEEP WITH PETS.    Day of Surgery:  Do not apply any deodorants/lotions.  Please wear clean clothes to the hospital/surgery center.   Remember to brush your teeth  WITH YOUR REGULAR TOOTHPASTE.    Please read over the following fact sheets that you were given.

## 2018-05-17 NOTE — Pre-Procedure Instructions (Signed)
Jeremiah [name]  05/17/2018     Your procedure is scheduled on Friday,  June 28  Report to [facility] Admitting at 8:30 A.M.             Your surgery or procedure is scheduled for 10:30 AM  Call this number if you have problems the morning of surgery:  [phone]- pre- op desk   Remember:  NOTHING TO EAT  After  Midnight. You may have clear liquids until 7:30 AM.  Clear Liquids include water, pop cycles that have no fruit, apple juice, white grape juice, cranberry juice, broth, plain jello.  Drink your Ensure Pre Surgery by 7:30 AM. Nothing after 7:30 AM.    Take these medicines the morning of surgery with A SIP OF WATER: metoprolol succinate (TOPROL-XL)  If needed: HYDROcodone-acetaminophen (NORCO/VICODIN)  EYE DROPS IF NEEDED  7 days prior to surgery STOP taking any Aspirin(unless otherwise instructed by your surgeon), Aleve, Naproxen, Ibuprofen, Motrin, Advil, Goody's, BC's, all herbal medications, fish oil, and all vitamins  Follow your doctors instructions regarding your Aspirin.  If no instructions were given by your doctor, then you will need to call the prescribing office office to get instructions.    WHAT DO I DO ABOUT MY DIABETES MEDICATION?  THE DAY BEFORE SURGERY DO NOT TAKE EVENING DOSE OF glimepiride (AMARYL)  . Do not take oral diabetes medicines (pills) the morning of surgery. glimepiride (AMARYL)   How to Manage Your Diabetes Before and After Surgery  Why is it important to control my blood sugar before and after surgery? . Improving blood sugar levels before and after surgery helps healing and can limit problems. . A way of improving blood sugar control is eating a healthy diet by: o  Eating less sugar and carbohydrates o  Increasing activity/exercise o  Talking with your doctor about reaching your blood sugar goals . High blood sugars (greater than 180 mg/dL) can raise your risk of infections and slow your recovery, so you will need to  focus on controlling your diabetes during the weeks before surgery. . Make sure that the doctor who takes care of your diabetes knows about your planned surgery including the date and location.  How do I manage my blood sugar before surgery? . Check your blood sugar at least 4 times a day, starting 2 days before surgery, to make sure that the level is not too high or low. o Check your blood sugar the morning of your surgery when you wake up and every 2 hours until you get to the Short Stay unit. . If your blood sugar is less than 70 mg/dL, you will need to treat for low blood sugar: o Do not take insulin. o Treat a low blood sugar (less than 70 mg/dL) with  cup of clear juice (cranberry or apple), 4 glucose tablets, OR glucose gel. o Recheck blood sugar in 15 minutes after treatment (to make sure it is greater than 70 mg/dL). If your blood sugar is not greater than 70 mg/dL on recheck, call [phone] for further instructions. . Report your blood sugar to the short stay nurse when you get to Short Stay.  . If you are admitted to the hospital after surgery: o Your blood sugar will be checked by the staff and you will probably be given insulin after surgery (instead of oral diabetes medicines) to make sure you have good blood sugar levels. o The goal for blood sugar control after surgery is  80-180 mg/dL.    Do not wear jewelry  Do not wear lotions, powders, or COLGONE, or deodorant.  Men may shave face and neck.  Do not bring valuables to the hospital.  [facility] is not responsible for any belongings or valuables.  Contacts, dentures or bridgework may not be worn into surgery.  Leave your suitcase in the car.  After surgery it may be brought to your room.  For patients admitted to the hospital, discharge time will be determined by your treatment team.  Patients discharged the day of surgery will not be allowed to drive home.    Special instructions:   Jeremiah [name]- Preparing For  Surgery  Before surgery, you can play an important role. Because skin is not sterile, your skin needs to be as free of germs as possible. You can reduce the number of germs on your skin by washing with CHG (chlorahexidine gluconate) Soap before surgery.  CHG is an antiseptic cleaner which kills germs and bonds with the skin to continue killing germs even after washing.    Oral Hygiene is also important to reduce your risk of infection.  Remember - BRUSH YOUR TEETH THE MORNING OF SURGERY WITH YOUR REGULAR TOOTHPASTE  Please do not use if you have an allergy to CHG or antibacterial soaps. If your skin becomes reddened/irritated stop using the CHG.  Do not shave (including legs and underarms) for at least 48 hours prior to first CHG shower. It is OK to shave your face.  Please follow these instructions carefully.   1. Shower the NIGHT BEFORE SURGERY and the MORNING OF SURGERY with CHG.   2. If you chose to wash your hair, wash your hair first as usual with your normal shampoo.  3. After you shampoo, rinse your hair and body thoroughly to remove the shampoo.  4. Use CHG as you would any other liquid soap. You can apply CHG directly to the skin and wash gently with a scrungie or a clean washcloth.   5. Apply the CHG Soap to your body ONLY FROM THE NECK DOWN.  Do not use on open wounds or open sores. Avoid contact with your eyes, ears, mouth and genitals (private parts). Wash Face and genitals (private parts)  with your normal soap.  6. Wash thoroughly, paying special attention to the area where your surgery will be performed.  7. Thoroughly rinse your body with warm water from the neck down.  8. DO NOT shower/wash with your normal soap after using and rinsing off the CHG Soap.  9. Pat yourself dry with a CLEAN TOWEL.  10. Wear CLEAN PAJAMAS to bed the night before surgery, wear comfortable clothes the morning of surgery  11. Place CLEAN SHEETS on your bed the night of your first shower and  DO NOT SLEEP WITH PETS.    Day of Surgery:  Do not apply any deodorants/lotions, powders or cologne.  Please wear clean clothes to the hospital/surgery center.   Remember to brush your teeth WITH YOUR REGULAR TOOTHPASTE.             Do not wear jewelry  Man may shave their face and neck.  Do not bring valuables to the hospital.  [facility] is not responsible for any belongings or valuables.  Contacts, dentures or bridgework may not be worn into surgery.  Leave your suitcase in the car.  After surgery it may be brought to your room.  For patients admitted to the hospital, discharge time will  be determined by your treatment team.  Patients discharged the day of surgery will not be allowed to drive home.   Please read over the following fact sheets that you were given: Pain Booklet, Coughing and Deep breathing, Surgical Site Infections.

## 2018-05-18 ENCOUNTER — Ambulatory Visit (HOSPITAL_COMMUNITY)
Admission: RE | Admit: 2018-05-18 | Discharge: 2018-05-18 | Disposition: A | Discharge status: Home / Self Care | Payer: Medicare Other | Source: Ambulatory Visit | Attending: Emergency Medicine | Admitting: Emergency Medicine

## 2018-05-18 ENCOUNTER — Other Ambulatory Visit: Payer: Self-pay

## 2018-05-18 ENCOUNTER — Encounter (HOSPITAL_COMMUNITY): Payer: Self-pay

## 2018-05-18 ENCOUNTER — Encounter (HOSPITAL_COMMUNITY): Payer: Self-pay | Admitting: *Deleted

## 2018-05-18 ENCOUNTER — Encounter (HOSPITAL_COMMUNITY)
Admission: RE | Admit: 2018-05-18 | Discharge: 2018-05-18 | Disposition: A | Discharge status: Home / Self Care | Payer: Medicare Other | Source: Ambulatory Visit | Attending: General Surgery | Admitting: General Surgery

## 2018-05-18 DIAGNOSIS — Z7984 Long term (current) use of oral hypoglycemic drugs: Secondary | ICD-10-CM | POA: Insufficient documentation

## 2018-05-18 DIAGNOSIS — Z79899 Other long term (current) drug therapy: Secondary | ICD-10-CM | POA: Insufficient documentation

## 2018-05-18 DIAGNOSIS — Z7982 Long term (current) use of aspirin: Secondary | ICD-10-CM | POA: Diagnosis not present

## 2018-05-18 DIAGNOSIS — I5032 Chronic diastolic (congestive) heart failure: Secondary | ICD-10-CM | POA: Insufficient documentation

## 2018-05-18 DIAGNOSIS — E1122 Type 2 diabetes mellitus with diabetic chronic kidney disease: Secondary | ICD-10-CM | POA: Insufficient documentation

## 2018-05-18 DIAGNOSIS — G4730 Sleep apnea, unspecified: Secondary | ICD-10-CM | POA: Diagnosis not present

## 2018-05-18 DIAGNOSIS — J90 Pleural effusion, not elsewhere classified: Secondary | ICD-10-CM | POA: Diagnosis not present

## 2018-05-18 DIAGNOSIS — E1151 Type 2 diabetes mellitus with diabetic peripheral angiopathy without gangrene: Secondary | ICD-10-CM | POA: Diagnosis not present

## 2018-05-18 DIAGNOSIS — R9389 Abnormal findings on diagnostic imaging of other specified body structures: Secondary | ICD-10-CM | POA: Diagnosis present

## 2018-05-18 DIAGNOSIS — K8010 Calculus of gallbladder with chronic cholecystitis without obstruction: Secondary | ICD-10-CM | POA: Diagnosis not present

## 2018-05-18 DIAGNOSIS — N189 Chronic kidney disease, unspecified: Secondary | ICD-10-CM | POA: Diagnosis not present

## 2018-05-18 DIAGNOSIS — I130 Hypertensive heart and chronic kidney disease with heart failure and stage 1 through stage 4 chronic kidney disease, or unspecified chronic kidney disease: Secondary | ICD-10-CM | POA: Insufficient documentation

## 2018-05-18 HISTORY — DX: Pneumonia, unspecified organism: J18.9

## 2018-05-18 HISTORY — DX: Unspecified diastolic (congestive) heart failure: I50.30

## 2018-05-18 HISTORY — DX: Enterocolitis due to Clostridium difficile, not specified as recurrent: A04.72

## 2018-05-18 LAB — CBC
HCT: 34.6 % — ABNORMAL LOW (ref 39.0–52.0)
Hemoglobin: 10.8 g/dL — ABNORMAL LOW (ref 13.0–17.0)
MCH: 25.7 pg — ABNORMAL LOW (ref 26.0–34.0)
MCHC: 31.2 g/dL (ref 30.0–36.0)
MCV: 82.4 fL (ref 78.0–100.0)
Platelets: 305 10*3/uL (ref 150–400)
RBC: 4.2 MIL/uL — ABNORMAL LOW (ref 4.22–5.81)
RDW: 15.2 % (ref 11.5–15.5)
WBC: 9.6 10*3/uL (ref 4.0–10.5)

## 2018-05-18 LAB — BASIC METABOLIC PANEL
Anion gap: 12 (ref 5–15)
BUN: 7 mg/dL — ABNORMAL LOW (ref 8–23)
CO2: 29 mmol/L (ref 22–32)
Calcium: 8.7 mg/dL — ABNORMAL LOW (ref 8.9–10.3)
Chloride: 95 mmol/L — ABNORMAL LOW (ref 98–111)
Creatinine, Ser: 1.3 mg/dL — ABNORMAL HIGH (ref 0.61–1.24)
GFR calc Af Amer: >60 mL/min (ref >60)
GFR calc non Af Amer: 52 mL/min — ABNORMAL LOW (ref >60)
Glucose, Bld: 165 mg/dL — ABNORMAL HIGH (ref 70–99)
Potassium: 3.1 mmol/L — ABNORMAL LOW (ref 3.5–5.1)
Sodium: 136 mmol/L (ref 135–145)

## 2018-05-18 LAB — GLUCOSE, CAPILLARY: Glucose-Capillary: 182 mg/dL — ABNORMAL HIGH (ref 70–99)

## 2018-05-18 NOTE — Progress Notes (Signed)
Mr [name] denies chest pain or shortness of breath.  Patient is in a wheel chair, he has become weak with hospitalizations this year.  Patient was seen by Dr Algie CofferKadakia 6/ 20/19, I requested notes and any studies.  Patient report that he checks CBG a few times a month, runs < 180.  Informed he and his wife the importance of checking CBD more frequently prior to and after surgery.

## 2018-05-19 ENCOUNTER — Encounter (HOSPITAL_COMMUNITY): Payer: Self-pay

## 2018-05-19 NOTE — Progress Notes (Signed)
Anesthesia Chart Review:   Case:  [id number] Date/Time:  05/21/18 1015   Procedure:  LAPAROSCOPIC CHOLECYSTECTOMY WITH INTRAOPERATIVE CHOLANGIOGRAM, POSSIBLE OPEN (N/A )   Anesthesia type:  General   Pre-op diagnosis:  Cholecystitis w/Cholelithiasis   Location:  [facility] ROOM 02 / [facility]   Surgeon:  Griselda Mineroth, Paul III, MD      DISCUSSION: - Pt is a 72 year old male with hx CHF, HTN, DM, CKD  - Hospitalized 5/21-5/25/19 for cholecystitis, sepsis; drain exchanged  - Hospitalized 5/4-04/01/18 for acute diastolic CHF  - Hospitalized 3/9-3/12/19 for acute cholecystitis (s/p drain placement), acute kidney injury    - Pt has cardiac clearance for surgery   VS: BP (P) 136/63   Pulse (P) 77   Temp (P) 36.7 C   Resp (P) 18   Ht (P) 5\' 10"  (1.778 m)   SpO2 (P) 99%   BMI (P) 26.54 kg/m    PROVIDERS: PCP and cardiologist is Rinaldo CloudHarwani, Mohan, MD who cleared pt for surgery after stress test (results below)   LABS: Labs reviewed: Acceptable for surgery.  - HbA1c was 8.9 on 04/16/18  (all labs ordered are listed, but only abnormal results are displayed)  Labs Reviewed  GLUCOSE, CAPILLARY - Abnormal; Notable for the following components:      Result Value   Glucose-Capillary 182 (*)    All other components within normal limits  BASIC METABOLIC PANEL - Abnormal; Notable for the following components:   Potassium 3.1 (*)    Chloride 95 (*)    Glucose, Bld 165 (*)    BUN 7 (*)    Creatinine, Ser 1.30 (*)    Calcium 8.7 (*)    GFR calc non Af Amer 52 (*)    All other components within normal limits  CBC - Abnormal; Notable for the following components:   RBC 4.20 (*)    Hemoglobin 10.8 (*)    HCT 34.6 (*)    MCH 25.7 (*)    All other components within normal limits     IMAGES:  CXR 05/18/18:  - Previously seen bibasilar infiltrates have resolved with only minimal pleural effusions remain.    EKG 04/13/18:  - Sinus rhythm with PACs. Nonspecific ST abnormality   CV:  Nuclear  stress test 05/14/18:  1. Probable interval inferior wall infarct with possible mild peri-infarct ischemia. Additional possible small area of inducible ischemia in the distal anteroseptal wall. 2. Interval decreased left ventricular ejection fraction without focal wall motion abnormality. 3. Left ventricular ejection fraction 51% 4. Non invasive risk stratification: Low  Echo 03/28/18:  - Left ventricle: The cavity size was normal. There was mild concentric hypertrophy. Systolic function was normal. The estimated ejection fraction was in the range of 50% to 55%. Wall motion was normal; there were no regional wall motion abnormalities. Doppler parameters are consistent with abnormal left ventricular relaxation (grade 1 diastolic dysfunction). - Aortic valve: Left coronary cusp mobility was mildly restricted. Transvalvular velocity was increased, due to stenosis. There was mild stenosis. There was trivial regurgitation. Valve area (VTI): 1.4 cm^2. Valve area (Vmax): 1.26 cm^2. Valve area (Vmean): 1.19 cm^2. - Mitral valve: Calcified annulus. Moderately calcified leaflets anterior. There was mild regurgitation. - Pericardium, extracardiac: There was a left pleural effusion.  Carotid duplex 07/15/16:  - The vertebral arteries appear patent with antegrade flow. - Findings consistent with 1- 39 percent stenosis involving theright internal carotid artery and the left internal carotidartery.   Past Medical History:  Diagnosis Date  .  Arthritis   . C. difficile diarrhea 04/2018  . Diabetes mellitus without complication (HCC)    Type II  . Diastolic CHF (HCC)   . Hypertension   . Peripheral vascular disease (HCC)   . Pneumonia   . Sleep apnea    DOES NOT USE    Past Surgical History:  Procedure Laterality Date  . COLONOSCOPY W/ POLYPECTOMY    . IR CATHETER TUBE CHANGE  04/14/2018  . IR CHOLANGIOGRAM EXISTING TUBE  03/17/2018  . IR PERC CHOLECYSTOSTOMY  01/31/2018  . IR RADIOLOGIST EVAL & MGMT   03/02/2018    MEDICATIONS: . aspirin 81 MG tablet  . brimonidine (ALPHAGAN) 0.2 % ophthalmic solution  . glimepiride (AMARYL) 4 MG tablet  . HYDROcodone-acetaminophen (NORCO/VICODIN) 5-325 MG tablet  . losartan (COZAAR) 100 MG tablet  . metoprolol succinate (TOPROL-XL) 50 MG 24 hr tablet  . simvastatin (ZOCOR) 40 MG tablet  . timolol (TIMOPTIC) 0.5 % ophthalmic solution   No current facility-administered medications for this encounter.     If no changes, I anticipate pt can proceed with surgery as scheduled.   Rica Mast, FNP-BC [facility]/Anesthesiology Phone: [phone] 05/19/2018 1:38 PM

## 2018-05-21 ENCOUNTER — Encounter (HOSPITAL_COMMUNITY): Payer: Self-pay | Admitting: Certified Registered Nurse Anesthetist

## 2018-05-21 ENCOUNTER — Encounter (HOSPITAL_COMMUNITY)
Admission: RE | Disposition: A | Discharge status: Home Health | Payer: Self-pay | Source: Ambulatory Visit | Attending: General Surgery

## 2018-05-21 ENCOUNTER — Ambulatory Visit (HOSPITAL_COMMUNITY): Payer: Medicare Other | Admitting: Certified Registered Nurse Anesthetist

## 2018-05-21 ENCOUNTER — Inpatient Hospital Stay (HOSPITAL_COMMUNITY)
Admission: RE | Admit: 2018-05-21 | Discharge: 2018-05-25 | DRG: 410 | Disposition: A | Discharge status: Home Health | Payer: Medicare Other | Source: Ambulatory Visit | Attending: General Surgery | Admitting: General Surgery

## 2018-05-21 ENCOUNTER — Ambulatory Visit (HOSPITAL_COMMUNITY): Payer: Medicare Other | Admitting: Emergency Medicine

## 2018-05-21 ENCOUNTER — Other Ambulatory Visit: Payer: Self-pay

## 2018-05-21 DIAGNOSIS — E876 Hypokalemia: Secondary | ICD-10-CM | POA: Diagnosis not present

## 2018-05-21 DIAGNOSIS — E1151 Type 2 diabetes mellitus with diabetic peripheral angiopathy without gangrene: Secondary | ICD-10-CM | POA: Diagnosis present

## 2018-05-21 DIAGNOSIS — I110 Hypertensive heart disease with heart failure: Secondary | ICD-10-CM | POA: Diagnosis present

## 2018-05-21 DIAGNOSIS — R32 Unspecified urinary incontinence: Secondary | ICD-10-CM | POA: Diagnosis not present

## 2018-05-21 DIAGNOSIS — K819 Cholecystitis, unspecified: Secondary | ICD-10-CM | POA: Diagnosis present

## 2018-05-21 DIAGNOSIS — Z79899 Other long term (current) drug therapy: Secondary | ICD-10-CM

## 2018-05-21 DIAGNOSIS — R5381 Other malaise: Secondary | ICD-10-CM | POA: Diagnosis present

## 2018-05-21 DIAGNOSIS — Z5331 Laparoscopic surgical procedure converted to open procedure: Secondary | ICD-10-CM

## 2018-05-21 DIAGNOSIS — Z87891 Personal history of nicotine dependence: Secondary | ICD-10-CM

## 2018-05-21 DIAGNOSIS — K8010 Calculus of gallbladder with chronic cholecystitis without obstruction: Principal | ICD-10-CM | POA: Diagnosis present

## 2018-05-21 DIAGNOSIS — R339 Retention of urine, unspecified: Secondary | ICD-10-CM | POA: Diagnosis not present

## 2018-05-21 DIAGNOSIS — I509 Heart failure, unspecified: Secondary | ICD-10-CM | POA: Diagnosis present

## 2018-05-21 DIAGNOSIS — Z7984 Long term (current) use of oral hypoglycemic drugs: Secondary | ICD-10-CM

## 2018-05-21 DIAGNOSIS — G4730 Sleep apnea, unspecified: Secondary | ICD-10-CM | POA: Diagnosis present

## 2018-05-21 HISTORY — DX: Type 2 diabetes mellitus without complications: E11.9

## 2018-05-21 HISTORY — DX: Sleep apnea, unspecified: G47.30

## 2018-05-21 HISTORY — DX: Pure hypercholesterolemia, unspecified: E78.00

## 2018-05-21 HISTORY — DX: Personal history of other diseases of the musculoskeletal system and connective tissue: Z87.39

## 2018-05-21 HISTORY — PX: CHOLECYSTECTOMY: SHX55

## 2018-05-21 LAB — GLUCOSE, CAPILLARY
Glucose-Capillary: 282 mg/dL — ABNORMAL HIGH (ref 70–99)
Glucose-Capillary: 192 mg/dL — ABNORMAL HIGH (ref 70–99)
Glucose-Capillary: 170 mg/dL — ABNORMAL HIGH (ref 70–99)
Glucose-Capillary: 145 mg/dL — ABNORMAL HIGH (ref 70–99)
Glucose-Capillary: 232 mg/dL — ABNORMAL HIGH (ref 70–99)

## 2018-05-21 SURGERY — LAPAROSCOPIC CHOLECYSTECTOMY WITH INTRAOPERATIVE CHOLANGIOGRAM
Anesthesia: General | Site: Abdomen

## 2018-05-21 MED ORDER — BRIMONIDINE TARTRATE 0.2 % OP SOLN
1.0000 [drp] | Freq: Two times a day (BID) | OPHTHALMIC | Status: DC
  Administered 2018-05-21 – 2018-05-25 (×8): 1 [drp] via OPHTHALMIC
  Filled 2018-05-21 (×3): qty 5

## 2018-05-21 MED ORDER — FENTANYL CITRATE (PF) 100 MCG/2ML IJ SOLN
25.0000 ug | INTRAMUSCULAR | Status: DC | PRN
  Administered 2018-05-21 (×2): 50 ug via INTRAVENOUS

## 2018-05-21 MED ORDER — ACETAMINOPHEN 500 MG PO TABS
1000.0000 mg | ORAL_TABLET | ORAL | Status: AC
  Administered 2018-05-21: 1000 mg via ORAL

## 2018-05-21 MED ORDER — LIDOCAINE IN D5W 4-5 MG/ML-% IV SOLN
1.0000 mg/min | INTRAVENOUS | Status: AC
  Administered 2018-05-21: 25 ug/kg/min via INTRAVENOUS
  Filled 2018-05-21: qty 500

## 2018-05-21 MED ORDER — LACTATED RINGERS IV SOLN
INTRAVENOUS | Status: DC
Start: 2018-05-21 — End: 2018-05-23
  Administered 2018-05-21: 10:00:00 via INTRAVENOUS

## 2018-05-21 MED ORDER — ONDANSETRON HCL 4 MG/2ML IJ SOLN
4.0000 mg | Freq: Four times a day (QID) | INTRAMUSCULAR | Status: DC | PRN
  Administered 2018-05-22: 4 mg via INTRAVENOUS
  Filled 2018-05-21: qty 2

## 2018-05-21 MED ORDER — METHOCARBAMOL 500 MG PO TABS
500.0000 mg | ORAL_TABLET | Freq: Four times a day (QID) | ORAL | Status: DC | PRN
  Administered 2018-05-21 – 2018-05-24 (×5): 500 mg via ORAL
  Filled 2018-05-21 (×5): qty 1

## 2018-05-21 MED ORDER — OXYCODONE HCL 5 MG/5ML PO SOLN: 5.0000 mg | Freq: Once | ORAL | Status: DC | PRN

## 2018-05-21 MED ORDER — BUPIVACAINE-EPINEPHRINE (PF) 0.25% -1:200000 IJ SOLN
INTRAMUSCULAR | Status: AC
  Filled 2018-05-21: qty 30

## 2018-05-21 MED ORDER — SODIUM CHLORIDE 0.9 % IR SOLN
Status: DC | PRN
  Administered 2018-05-21: 1

## 2018-05-21 MED ORDER — FENTANYL CITRATE (PF) 100 MCG/2ML IJ SOLN
INTRAMUSCULAR | Status: AC
  Filled 2018-05-21: qty 2

## 2018-05-21 MED ORDER — LABETALOL HCL 5 MG/ML IV SOLN
INTRAVENOUS | Status: DC | PRN
  Administered 2018-05-21: 5 mg via INTRAVENOUS

## 2018-05-21 MED ORDER — CEFAZOLIN SODIUM-DEXTROSE 2-4 GM/100ML-% IV SOLN
2.0000 g | INTRAVENOUS | Status: AC
  Administered 2018-05-21: 2 g via INTRAVENOUS
  Filled 2018-05-21: qty 100

## 2018-05-21 MED ORDER — ACETAMINOPHEN 500 MG PO TABS
1000.0000 mg | ORAL_TABLET | ORAL | Status: AC
  Administered 2018-05-21: 1000 mg via ORAL
  Filled 2018-05-21: qty 2

## 2018-05-21 MED ORDER — PROPOFOL 10 MG/ML IV BOLUS
INTRAVENOUS | Status: DC | PRN
  Administered 2018-05-21: 150 mg via INTRAVENOUS

## 2018-05-21 MED ORDER — CHLORHEXIDINE GLUCONATE CLOTH 2 % EX PADS: 6.0000 | MEDICATED_PAD | Freq: Once | CUTANEOUS | Status: DC

## 2018-05-21 MED ORDER — ENSURE ENLIVE PO LIQD
237.0000 mL | Freq: Two times a day (BID) | ORAL | Status: DC
  Administered 2018-05-21 – 2018-05-25 (×8): 237 mL via ORAL

## 2018-05-21 MED ORDER — IOPAMIDOL (ISOVUE-300) INJECTION 61%
INTRAVENOUS | Status: AC
  Filled 2018-05-21: qty 50

## 2018-05-21 MED ORDER — ONDANSETRON HCL 4 MG/2ML IJ SOLN
4.0000 mg | Freq: Once | INTRAMUSCULAR | Status: AC | PRN
  Administered 2018-05-21: 4 mg via INTRAVENOUS

## 2018-05-21 MED ORDER — BUPIVACAINE-EPINEPHRINE 0.25% -1:200000 IJ SOLN
INTRAMUSCULAR | Status: DC | PRN
  Administered 2018-05-21: 25 mL

## 2018-05-21 MED ORDER — METRONIDAZOLE IN NACL 5-0.79 MG/ML-% IV SOLN
500.0000 mg | INTRAVENOUS | Status: AC
  Administered 2018-05-21: 500 mg via INTRAVENOUS
  Filled 2018-05-21: qty 100

## 2018-05-21 MED ORDER — LOSARTAN POTASSIUM 50 MG PO TABS
100.0000 mg | ORAL_TABLET | Freq: Every day | ORAL | Status: DC
  Administered 2018-05-22 – 2018-05-25 (×4): 100 mg via ORAL
  Filled 2018-05-21 (×4): qty 2

## 2018-05-21 MED ORDER — MORPHINE SULFATE (PF) 2 MG/ML IV SOLN: 1.0000 mg | INTRAVENOUS | Status: DC | PRN

## 2018-05-21 MED ORDER — OXYCODONE HCL 5 MG PO TABS: 5.0000 mg | ORAL_TABLET | Freq: Once | ORAL | Status: DC | PRN

## 2018-05-21 MED ORDER — EPHEDRINE SULFATE 50 MG/ML IJ SOLN
INTRAMUSCULAR | Status: DC | PRN
  Administered 2018-05-21: 5 mg via INTRAVENOUS

## 2018-05-21 MED ORDER — ONDANSETRON HCL 4 MG/2ML IJ SOLN
INTRAMUSCULAR | Status: DC | PRN
  Administered 2018-05-21: 4 mg via INTRAVENOUS

## 2018-05-21 MED ORDER — METOPROLOL SUCCINATE ER 50 MG PO TB24
50.0000 mg | ORAL_TABLET | Freq: Every day | ORAL | Status: DC
  Administered 2018-05-21 – 2018-05-25 (×5): 50 mg via ORAL
  Filled 2018-05-21 (×5): qty 1

## 2018-05-21 MED ORDER — SUGAMMADEX SODIUM 200 MG/2ML IV SOLN
INTRAVENOUS | Status: DC | PRN
Start: 2018-05-21 — End: 2018-05-21
  Administered 2018-05-21: 308.4 mg via INTRAVENOUS

## 2018-05-21 MED ORDER — GLIMEPIRIDE 4 MG PO TABS
4.0000 mg | ORAL_TABLET | Freq: Two times a day (BID) | ORAL | Status: DC
  Administered 2018-05-21 – 2018-05-25 (×8): 4 mg via ORAL
  Filled 2018-05-21 (×9): qty 1

## 2018-05-21 MED ORDER — MEPERIDINE HCL 50 MG/ML IJ SOLN: 6.2500 mg | INTRAMUSCULAR | Status: DC | PRN

## 2018-05-21 MED ORDER — ONDANSETRON HCL 4 MG/2ML IJ SOLN
INTRAMUSCULAR | Status: AC
  Filled 2018-05-21: qty 2

## 2018-05-21 MED ORDER — INSULIN ASPART 100 UNIT/ML ~~LOC~~ SOLN
0.0000 [IU] | SUBCUTANEOUS | Status: DC
  Administered 2018-05-21: 1 [IU] via SUBCUTANEOUS
  Administered 2018-05-21 – 2018-05-22 (×2): 3 [IU] via SUBCUTANEOUS

## 2018-05-21 MED ORDER — ACETAMINOPHEN 325 MG PO TABS: 325.0000 mg | ORAL_TABLET | ORAL | Status: DC | PRN

## 2018-05-21 MED ORDER — ACETAMINOPHEN 160 MG/5ML PO SOLN: 325.0000 mg | ORAL | Status: DC | PRN

## 2018-05-21 MED ORDER — GABAPENTIN 300 MG PO CAPS
300.0000 mg | ORAL_CAPSULE | ORAL | Status: AC
  Administered 2018-05-21: 300 mg via ORAL

## 2018-05-21 MED ORDER — PIPERACILLIN-TAZOBACTAM 3.375 G IVPB
3.3750 g | Freq: Three times a day (TID) | INTRAVENOUS | Status: DC
  Administered 2018-05-21 – 2018-05-25 (×12): 3.375 g via INTRAVENOUS
  Filled 2018-05-21 (×13): qty 50

## 2018-05-21 MED ORDER — ASPIRIN EC 81 MG PO TBEC
81.0000 mg | DELAYED_RELEASE_TABLET | Freq: Every day | ORAL | Status: DC
  Administered 2018-05-22 – 2018-05-25 (×4): 81 mg via ORAL
  Filled 2018-05-21 (×4): qty 1

## 2018-05-21 MED ORDER — ONDANSETRON 4 MG PO TBDP: 4.0000 mg | ORAL_TABLET | Freq: Four times a day (QID) | ORAL | Status: DC | PRN

## 2018-05-21 MED ORDER — SIMVASTATIN 40 MG PO TABS
40.0000 mg | ORAL_TABLET | Freq: Every day | ORAL | Status: DC
  Administered 2018-05-21 – 2018-05-24 (×4): 40 mg via ORAL
  Filled 2018-05-21 (×4): qty 1

## 2018-05-21 MED ORDER — TIMOLOL MALEATE 0.5 % OP SOLN
1.0000 [drp] | Freq: Two times a day (BID) | OPHTHALMIC | Status: DC
  Administered 2018-05-21 – 2018-05-25 (×8): 1 [drp] via OPHTHALMIC
  Filled 2018-05-21 (×3): qty 5

## 2018-05-21 MED ORDER — LIDOCAINE HCL (CARDIAC) PF 100 MG/5ML IV SOSY
PREFILLED_SYRINGE | INTRAVENOUS | Status: DC | PRN
  Administered 2018-05-21: 40 mg via INTRAVENOUS

## 2018-05-21 MED ORDER — HYDROCODONE-ACETAMINOPHEN 5-325 MG PO TABS
1.0000 | ORAL_TABLET | ORAL | Status: DC | PRN
  Administered 2018-05-21 – 2018-05-23 (×5): 1 via ORAL
  Filled 2018-05-21 (×5): qty 1

## 2018-05-21 MED ORDER — METOPROLOL SUCCINATE ER 50 MG PO TB24: 50.0000 mg | ORAL_TABLET | Freq: Every day | ORAL | Status: DC

## 2018-05-21 MED ORDER — SODIUM CHLORIDE 0.9 % IV SOLN
INTRAVENOUS | Status: DC
  Administered 2018-05-23: 20:00:00 via INTRAVENOUS

## 2018-05-21 MED ORDER — FENTANYL CITRATE (PF) 250 MCG/5ML IJ SOLN
INTRAMUSCULAR | Status: AC
  Filled 2018-05-21: qty 5

## 2018-05-21 MED ORDER — PHENYLEPHRINE HCL 10 MG/ML IJ SOLN
INTRAMUSCULAR | Status: DC | PRN
  Administered 2018-05-21: 25 ug/min via INTRAVENOUS

## 2018-05-21 MED ORDER — VECURONIUM BROMIDE 10 MG IV SOLR
INTRAVENOUS | Status: DC | PRN
  Administered 2018-05-21: 2 mg via INTRAVENOUS
  Administered 2018-05-21: 7 mg via INTRAVENOUS

## 2018-05-21 MED ORDER — 0.9 % SODIUM CHLORIDE (POUR BTL) OPTIME
TOPICAL | Status: DC | PRN
  Administered 2018-05-21: 1000 mL

## 2018-05-21 MED ORDER — CELECOXIB 200 MG PO CAPS: 200.0000 mg | ORAL_CAPSULE | ORAL | Status: DC

## 2018-05-21 MED ORDER — GABAPENTIN 300 MG PO CAPS
300.0000 mg | ORAL_CAPSULE | ORAL | Status: DC
  Filled 2018-05-21: qty 1

## 2018-05-21 MED ORDER — FAMOTIDINE IN NACL 20-0.9 MG/50ML-% IV SOLN
20.0000 mg | Freq: Two times a day (BID) | INTRAVENOUS | Status: DC
  Administered 2018-05-21: 20 mg via INTRAVENOUS
  Filled 2018-05-21: qty 50

## 2018-05-21 MED ORDER — HEPARIN SODIUM (PORCINE) 5000 UNIT/ML IJ SOLN
5000.0000 [IU] | Freq: Three times a day (TID) | INTRAMUSCULAR | Status: DC
  Administered 2018-05-22 – 2018-05-25 (×10): 5000 [IU] via SUBCUTANEOUS
  Filled 2018-05-21 (×11): qty 1

## 2018-05-21 MED ORDER — FENTANYL CITRATE (PF) 100 MCG/2ML IJ SOLN
INTRAMUSCULAR | Status: DC | PRN
  Administered 2018-05-21: 50 ug via INTRAVENOUS
  Administered 2018-05-21: 100 ug via INTRAVENOUS

## 2018-05-21 MED ORDER — CELECOXIB 200 MG PO CAPS
200.0000 mg | ORAL_CAPSULE | ORAL | Status: AC
  Administered 2018-05-21: 200 mg via ORAL
  Filled 2018-05-21: qty 1

## 2018-05-21 SURGICAL SUPPLY — 50 items
ADH SKN CLS APL DERMABOND .7 (GAUZE/BANDAGES/DRESSINGS) ×1
APPLIER CLIP 5 13 M/L LIGAMAX5 (MISCELLANEOUS) ×3
APR CLP MED LRG 5 ANG JAW (MISCELLANEOUS) ×1
BAG SPEC RTRVL LRG 6X4 10 (ENDOMECHANICALS) ×1
BLADE CLIPPER SURG (BLADE) IMPLANT
CANISTER SUCT 3000ML PPV (MISCELLANEOUS) ×3 IMPLANT
CATH REDDICK CHOLANGI 4FR 50CM (CATHETERS) ×3 IMPLANT
CHLORAPREP W/TINT 26ML (MISCELLANEOUS) ×3 IMPLANT
CLIP APPLIE 5 13 M/L LIGAMAX5 (MISCELLANEOUS) ×1 IMPLANT
COVER MAYO STAND STRL (DRAPES) ×3 IMPLANT
COVER SURGICAL LIGHT HANDLE (MISCELLANEOUS) ×5 IMPLANT
DERMABOND ADVANCED (GAUZE/BANDAGES/DRESSINGS) ×2
DERMABOND ADVANCED .7 DNX12 (GAUZE/BANDAGES/DRESSINGS) ×1 IMPLANT
DRAIN CHANNEL 19F RND (DRAIN) ×2 IMPLANT
DRAPE C-ARM 42X72 X-RAY (DRAPES) ×3 IMPLANT
DRSG OPSITE POSTOP 3X4 (GAUZE/BANDAGES/DRESSINGS) ×2 IMPLANT
DRSG OPSITE POSTOP 4X10 (GAUZE/BANDAGES/DRESSINGS) ×3 IMPLANT
DRSG OPSITE POSTOP 4X6 (GAUZE/BANDAGES/DRESSINGS) ×2 IMPLANT
ELECT BLADE 6.5 EXT (BLADE) ×2 IMPLANT
ELECT CAUTERY BLADE 6.4 (BLADE) ×2 IMPLANT
ELECT REM PT RETURN 9FT ADLT (ELECTROSURGICAL) ×3
ELECTRODE REM PT RTRN 9FT ADLT (ELECTROSURGICAL) ×1 IMPLANT
EVACUATOR SILICONE 100CC (DRAIN) ×3 IMPLANT
GLOVE BIO SURGEON STRL SZ7.5 (GLOVE) ×3 IMPLANT
GOWN STRL REUS W/ TWL LRG LVL3 (GOWN DISPOSABLE) ×3 IMPLANT
GOWN STRL REUS W/TWL LRG LVL3 (GOWN DISPOSABLE) ×9
IV CATH 14GX2 1/4 (CATHETERS) ×3 IMPLANT
KIT BASIN OR (CUSTOM PROCEDURE TRAY) ×3 IMPLANT
KIT TURNOVER KIT B (KITS) ×3 IMPLANT
NS IRRIG 1000ML POUR BTL (IV SOLUTION) ×3 IMPLANT
PAD ARMBOARD 7.5X6 YLW CONV (MISCELLANEOUS) ×3 IMPLANT
PENCIL BUTTON HOLSTER BLD 10FT (ELECTRODE) ×2 IMPLANT
POUCH SPECIMEN RETRIEVAL 10MM (ENDOMECHANICALS) ×3 IMPLANT
SCISSORS LAP 5X35 DISP (ENDOMECHANICALS) ×3 IMPLANT
SET IRRIG TUBING LAPAROSCOPIC (IRRIGATION / IRRIGATOR) ×3 IMPLANT
SLEEVE ENDOPATH XCEL 5M (ENDOMECHANICALS) ×6 IMPLANT
SPECIMEN JAR SMALL (MISCELLANEOUS) ×3 IMPLANT
SUT ETHILON 2 0 FS 18 (SUTURE) ×2 IMPLANT
SUT MNCRL AB 4-0 PS2 18 (SUTURE) ×3 IMPLANT
SUT PDS AB 1 TP1 96 (SUTURE) ×3 IMPLANT
TOWEL OR 17X24 6PK STRL BLUE (TOWEL DISPOSABLE) ×3 IMPLANT
TOWEL OR 17X26 10 PK STRL BLUE (TOWEL DISPOSABLE) ×3 IMPLANT
TRAY LAPAROSCOPIC MC (CUSTOM PROCEDURE TRAY) ×3 IMPLANT
TROCAR XCEL BLUNT TIP 100MML (ENDOMECHANICALS) ×3 IMPLANT
TROCAR XCEL NON-BLD 5MMX100MML (ENDOMECHANICALS) ×3 IMPLANT
TUBE CONNECTING 12'X1/4 (SUCTIONS) ×1
TUBE CONNECTING 12X1/4 (SUCTIONS) ×1 IMPLANT
TUBING INSUFFLATION (TUBING) ×3 IMPLANT
WATER STERILE IRR 1000ML POUR (IV SOLUTION) ×3 IMPLANT
YANKAUER SUCT BULB TIP NO VENT (SUCTIONS) ×2 IMPLANT

## 2018-05-21 NOTE — Progress Notes (Signed)
Call to Dr. Carolynne Edouardoth, relative to order for operative consent. He agrees the the order is as he wishes & he will be on unit to sign before surgery.

## 2018-05-21 NOTE — H&P (Signed)
Jeremiah [name]  Location: [facility] Patient #: [id number] DOB: [birth year] Divorced / Language: English / Race: Black or African American Male   History of Present Illness The patient is a 72 year old male who presents for a follow-up for Abdominal pain. The patient is a 72 year old black male who was hospitalized a couple months ago with severe cholecystitis that was treated with a percutaneous drain. Since that time he has had a couple more episodes of worsening of his cholecystitis that has been managed with the drain and antibiotics. He is scheduled for surgery next week. His gallbladder still fills and empties but has a very ratty appearance and the drainage has been foul-smelling. It does not appear to be stool-like to suggest a fistula to the colon or small bowel. He has been cleared by cardiology.   Allergies  No Known Allergies  Allergies Reconciled   Medication History  Saline Bacteriostatic (0.9% Solution, 10 Injection daily, Taken starting 03/22/2018) Active. AmLODIPine Besylate (10MG  Tablet, Oral) Active. Glimepiride (4MG  Tablet, Oral) Active. Jardiance (10MG  Tablet, Oral) Active. Simvastatin (40MG  Tablet, Oral) Active. Losartan Potassium (100MG  Tablet, Oral) Active. Pioglitazone HCl (30MG  Tablet, Oral) Active. Medications Reconciled    Review of Systems  General Not Present- Appetite Loss, Chills, Fatigue, Fever, Night Sweats, Weight Gain and Weight Loss. Note: All other systems negative (unless as noted in HPI & included Review of Systems) Skin Not Present- Change in Wart/Mole, Dryness, Hives, Jaundice, New Lesions, Non-Healing Wounds, Rash and Ulcer. HEENT Not Present- Earache, Hearing Loss, Hoarseness, Nose Bleed, Oral Ulcers, Ringing in the Ears, Seasonal Allergies, Sinus Pain, Sore Throat, Visual Disturbances, Wears glasses/contact lenses and Yellow Eyes. Respiratory Not Present- Bloody sputum, Chronic Cough, Difficulty Breathing,  Snoring and Wheezing. Breast Not Present- Breast Mass, Breast Pain, Nipple Discharge and Skin Changes. Cardiovascular Not Present- Chest Pain, Difficulty Breathing Lying Down, Leg Cramps, Palpitations, Rapid Heart Rate, Shortness of Breath and Swelling of Extremities. Gastrointestinal Not Present- Abdominal Pain, Bloating, Bloody Stool, Change in Bowel Habits, Chronic diarrhea, Constipation, Difficulty Swallowing, Excessive gas, Gets full quickly at meals, Hemorrhoids, Indigestion, Nausea, Rectal Pain and Vomiting. Musculoskeletal Not Present- Back Pain, Joint Pain, Joint Stiffness, Muscle Pain, Muscle Weakness and Swelling of Extremities. Neurological Not Present- Decreased Memory, Fainting, Headaches, Numbness, Seizures, Tingling, Tremor, Trouble walking and Weakness. Psychiatric Not Present- Anxiety, Bipolar, Change in Sleep Pattern, Depression, Fearful and Frequent crying. Endocrine Not Present- Cold Intolerance, Excessive Hunger, Hair Changes, Heat Intolerance and New Diabetes. Hematology Not Present- Easy Bruising, Excessive bleeding, Gland problems, HIV and Persistent Infections.  Vitals  Weight: 178.25 lb Height: 69in Body Surface Area: 1.97 m Body Mass Index: 26.32 kg/m  Temp.: 98.48F(Oral)  Pulse: 87 (Regular)  BP: 132/82 (Sitting, Left Arm, Standard)       Physical Exam  General Mental Status-Alert. General Appearance-Consistent with stated age. Hydration-Well hydrated. Voice-Normal.  Head and Neck Head-normocephalic, atraumatic with no lesions or palpable masses. Trachea-midline. Thyroid Gland Characteristics - normal size and consistency.  Eye Eyeball - Bilateral-Extraocular movements intact. Sclera/Conjunctiva - Bilateral-No scleral icterus.  Chest and Lung Exam Chest and lung exam reveals -quiet, even and easy respiratory effort with no use of accessory muscles and on auscultation, normal breath sounds, no adventitious sounds and  normal vocal resonance. Inspection Chest Wall - Normal. Back - normal.  Cardiovascular Cardiovascular examination reveals -normal heart sounds, regular rate and rhythm with no murmurs and normal pedal pulses bilaterally.  Abdomen Note: The abdomen is soft and nontender. There is some mild distention and  good bowel sounds. The drain tube is intact draining mostly cloudy watery fluid   Neurologic Neurologic evaluation reveals -alert and oriented x 3 with no impairment of recent or remote memory. Mental Status-Normal.  Musculoskeletal Normal Exam - Left-Upper Extremity Strength Normal and Lower Extremity Strength Normal. Normal Exam - Right-Upper Extremity Strength Normal and Lower Extremity Strength Normal.  Lymphatic Head & Neck  General Head & Neck Lymphatics: Bilateral - Description - Normal. Axillary  General Axillary Region: Bilateral - Description - Normal. Tenderness - Non Tender. Femoral & Inguinal  Generalized Femoral & Inguinal Lymphatics: Bilateral - Description - Normal. Tenderness - Non Tender.    Assessment & Plan CHOLECYSTITIS (K81.9) Impression: The patient has had a known problem with cholecystitis that was treated in couple months ago with a percutaneous cholecystostomy tube. He has been in and out of the hospital couple times with persistent symptoms. At this point I think he just needs to have his gallbladder removed. He agrees. I think it is likely that this will need to be an open cholecystectomy and that the gallbladder will likely only come out in pieces but as long as we can control the cystic duct and not damage the colon and duodenum that I think she will benefit from the surgery. I have discussed with him in detail the risks and benefits of the operation as well as some of the technical aspects and he understands and wishes to proceed

## 2018-05-21 NOTE — Interval H&P Note (Signed)
History and Physical Interval Note:  05/21/2018 10:24 AM  Curley SpiceWalter L Falk  has presented today for surgery, with the diagnosis of Cholecystitis w/Cholelithiasis  The various methods of treatment have been discussed with the patient and family. After consideration of risks, benefits and other options for treatment, the patient has consented to  Procedure(s): LAPAROSCOPIC CHOLECYSTECTOMY WITH INTRAOPERATIVE CHOLANGIOGRAM, POSSIBLE OPEN (N/A) as a surgical intervention .  The patient's history has been reviewed, patient examined, no change in status, stable for surgery.  I have reviewed the patient's chart and labs.  Questions were answered to the patient's satisfaction.     TOTH III,PAUL S

## 2018-05-21 NOTE — Anesthesia Postprocedure Evaluation (Signed)
Anesthesia Post Note  Patient: Jeremiah [name]  Procedure(s) Performed: ATTEMPTED LAPAROSCOPIC CHOLECYSTECTOMY, OPEN DRAINAGE OF GALLBLADDER WITH BIOPSY (N/A Abdomen)     Patient location during evaluation: PACU Anesthesia Type: General Level of consciousness: awake and alert Pain management: pain level controlled Vital Signs Assessment: post-procedure vital signs reviewed and stable Respiratory status: spontaneous breathing, nonlabored ventilation, respiratory function stable and patient connected to nasal cannula oxygen Cardiovascular status: blood pressure returned to baseline and stable Postop Assessment: no apparent nausea or vomiting Anesthetic complications: no    Last Vitals:  Vitals:   05/21/18 1332 05/21/18 1412  BP: (!) 160/64 (!) 174/75  Pulse: (!) 56 (!) 56  Resp: 11 12  Temp: 36.5 C 36.6 C  SpO2: 95% 95%    Last Pain:  Vitals:   05/21/18 1413  TempSrc:   PainSc: 0-No pain                 Orion Mole

## 2018-05-21 NOTE — Transfer of Care (Signed)
Immediate Anesthesia Transfer of Care Note  Patient: Jeremiah [name]  Procedure(s) Performed: ATTEMPTED LAPAROSCOPIC CHOLECYSTECTOMY, OPEN DRAINAGE OF GALLBLADDER WITH BIOPSY (N/A Abdomen)  Patient Location: PACU  Anesthesia Type:General  Level of Consciousness: drowsy and patient cooperative  Airway & Oxygen Therapy: Patient Spontanous Breathing and Patient connected to face mask oxygen  Post-op Assessment: Report given to RN and Post -op Vital signs reviewed and stable  Post vital signs: Reviewed and stable  Last Vitals:  Vitals Value Taken Time  BP 198/76 05/21/2018 12:18 PM  Temp    Pulse 56 05/21/2018 12:20 PM  Resp 15 05/21/2018 12:20 PM  SpO2 100 % 05/21/2018 12:20 PM  Vitals shown include unvalidated device data.  Last Pain:  Vitals:   05/21/18 0836  TempSrc: Oral         Complications: No apparent anesthesia complications

## 2018-05-21 NOTE — Progress Notes (Signed)
Received from PACU at this time, denies pain, c/o nausea. Oriented to room and surroundings, family at bedside

## 2018-05-21 NOTE — Anesthesia Preprocedure Evaluation (Addendum)
Anesthesia Evaluation  Patient identified by MRN, date of birth, ID band Patient awake    Reviewed: Allergy & Precautions, H&P , NPO status , Patient's Chart, lab work & pertinent test results, reviewed documented beta blocker date and time   Airway Mallampati: II  TM Distance: >3 FB Neck ROM: full    Dental no notable dental hx. (+) Edentulous Upper, Missing,    Pulmonary sleep apnea , former smoker,    Pulmonary exam normal breath sounds clear to auscultation       Cardiovascular Exercise Tolerance: Good hypertension, Pt. on medications and Pt. on home beta blockers + Peripheral Vascular Disease and +CHF   Rhythm:regular Rate:Normal  EKG 04/13/18:  - Sinus rhythm with PACs. Nonspecific ST abnormality  Nuclear stress test 05/14/18:  1. Probable interval inferior wall infarct with possible mild peri-infarct ischemia. Additional possible small area of inducible ischemia in the distal anteroseptal wall. 2. Interval decreased left ventricular ejection fraction without focal wall motion abnormality. 3. Left ventricular ejection fraction 51% 4. Non invasive risk stratification: Low  Echo 03/28/18:  - Left ventricle: The cavity size was normal. There was mildconcentric hypertrophy. Systolic function was normal. Theestimated ejection fraction was in the range of 50% to 55%. Wallmotion was normal; there were no regional wall motionabnormalities. Doppler parameters are consistent with abnormal left ventricular relaxation (grade 1 diastolic dysfunction). - Aortic valve: Left coronary cusp mobility was mildly restricted.Transvalvular velocity was increased, due to stenosis. There wasmild stenosis. There was trivial regurgitation. Valve area (VTI):1.4 cm^2. Valve area (Vmax): 1.26 cm^2. Valve area (Vmean): 1.19 cm^2. - Mitral valve: Calcified annulus. Moderately calcified leafletsanterior. There was mild regurgitation. - Pericardium,  extracardiac: There was a left pleural effusion.       Neuro/Psych    GI/Hepatic   Endo/Other  diabetes, Oral Hypoglycemic Agents  Renal/GU CRFRenal disease  negative genitourinary   Musculoskeletal  (+) Arthritis , Osteoarthritis,    Abdominal   Peds  Hematology negative hematology ROS (+) anemia ,   Anesthesia Other Findings   Reproductive/Obstetrics                            Anesthesia Physical Anesthesia Plan  ASA: III  Anesthesia Plan: General   Post-op Pain Management:    Induction: Intravenous  PONV Risk Score and Plan: 2 and Ondansetron, Treatment may vary due to age or medical condition and Midazolam  Airway Management Planned: Oral ETT  Additional Equipment:   Intra-op Plan:   Post-operative Plan: Extubation in OR  Informed Consent: I have reviewed the patients History and Physical, chart, labs and discussed the procedure including the risks, benefits and alternatives for the proposed anesthesia with the patient or authorized representative who has indicated his/her understanding and acceptance.   Dental Advisory Given  Plan Discussed with: CRNA, Anesthesiologist and Surgeon  Anesthesia Plan Comments: (  )       Anesthesia Quick Evaluation

## 2018-05-21 NOTE — Op Note (Signed)
05/21/2018  12:05 PM  PATIENT:  Jeremiah [name]  72 y.o. male  PRE-OPERATIVE DIAGNOSIS:  Cholecystitis w/Cholelithiasis  POST-OPERATIVE DIAGNOSIS:  Cholecystitis w/Cholelithiasis  PROCEDURE:  Procedure(s): ATTEMPTED LAPAROSCOPIC CHOLECYSTECTOMY, OPEN DRAINAGE OF GALLBLADDER WITH BIOPSY (N/A)  SURGEON:  Surgeon(s) and Role:    * Griselda Mineroth, Joanathan Affeldt III, MD - Primary    * Darnell LevelGerkin, Todd, MD - Assisting  PHYSICIAN ASSISTANT:   ASSISTANTS: Dr. Gerrit FriendsGerkin   ANESTHESIA:   general  EBL:  50 mL   BLOOD ADMINISTERED:none  DRAINS: (1) Jackson-Pratt drain(s) with closed bulb suction in the gallbladder bed   LOCAL MEDICATIONS USED:  MARCAINE     SPECIMEN:  Source of Specimen:  wall of gallbladder  DISPOSITION OF SPECIMEN:  PATHOLOGY  COUNTS:  YES  TOURNIQUET:  * No tourniquets in log *  DICTATION: .Dragon Dictation   After informed consent was obtained the patient was brought to the operating room and placed in the supine position on the operating table.  After adequate induction of general anesthesia the patient's abdomen was prepped with ChloraPrep, allowed to dry, and draped in usual sterile manner.  The area below the umbilicus was infiltrated with quarter percent Marcaine.  A small vertically oriented incision was made with a 15 blade knife.  The incision was carried through the skin and subcutaneous tissue sharply with electrocautery until the linea alba was identified.  The linea alba was incised with a 15 blade knife.  Each side was grasped with Coker clamps and elevated anteriorly.  The preperitoneal space was probed bluntly with a hemostat until the peritoneum was opened and access was gained to the abdominal cavity.  A 0 Vicryl pursestring stitch was placed around the fascia.  A Hassan cannula was placed through the opening and anchored in place with the previously placed Vicryl pursestring stitch.  The abdomen was then insufflated with carbon dioxide without difficulty.  A laparoscope was  [facility] and the abdomen was inspected.  There was significant adhesion in the right upper quadrant.  Next the epigastric area was infiltrated with quarter percent Marcaine.  A small incision was made with a 15 blade knife and a 5 mm port was placed bluntly through this incision into the abdominal cavity under direct vision.  2 sites were chosen laterally on the right side of the abdomen for placement of 5 mm ports.  Each of these areas was infiltrated with quarter percent Marcaine and small stab incisions were made with a 15 blade knife.  5 mm ports were placed through these openings into the abdominal cavity under direct vision.  I was able to take down some of the adhesions sharply with the laparoscopic scissors.  The tissue around the gallbladder was so densely stuck that we decided to open.  A right subcostal incision was made with a 10 blade knife.  The incision was carried through the skin and subcutaneous tissue sharply with electrocautery until the fascia of the anterior abdominal wall was encountered.  The fascia and muscle layers were opened sharply with electrocautery until the peritoneal cavity was accessed.  I was then able to bluntly dissecting the area of the dome of the gallbladder where it joined the liver.  This dissection was carried out until we were able to enter the gallbladder.  The gallbladder wall was not able to be separated from the surrounding tissue or liver.  I did take a biopsy of the wall and sent this to pathology.  No bile was noted  coming from the base of the gallbladder.  There was a significant desmoplastic reaction in the area with unresectable adhesion to the surrounding structures such as the colon and duodenum.  At this point instead of risking injury to the surrounding structures we decided to stop.  A drain was placed through the lateralmost 5 mm port and laid along the bed of the gallbladder.  The mucosa of the gallbladder was fulgurated with  the cautery.  The abscess and irrigated with copious amounts of saline.  The fascia of the abdominal wall was then closed with 2 running #1 double-stranded loop PDS sutures.  The subcutaneous tissue was irrigated with copious amounts of saline and the skin was closed with staples.  The Hassan cannula was removed and the fascial defect inferior to the umbilicus was closed with the previously placed Vicryl pursestring stitch.  And the skin was closed with staples.  Sterile dressings were applied.  The drain was placed to bulb suction and there was a good seal.  The patient tolerated the procedure well.  At the end of the case all needle sponge and instrument counts were correct.  The patient was then awakened and taken to recovery in stable condition.  The assistant was instrumental in visualization and retraction during the case.  PLAN OF CARE: Admit for overnight observation  PATIENT DISPOSITION:  PACU - hemodynamically stable.   Delay start of Pharmacological VTE agent (>24hrs) due to surgical blood loss or risk of bleeding: no

## 2018-05-21 NOTE — Anesthesia Procedure Notes (Signed)
Procedure Name: Intubation Date/Time: 05/21/2018 10:40 AM Performed by: Waynard EdwardsSmith, Roger Fasnacht A, CRNA Pre-anesthesia Checklist: Patient identified, Emergency Drugs available, Suction available, Patient being monitored and Timeout performed Patient Re-evaluated:Patient Re-evaluated prior to induction Oxygen Delivery Method: Circle system utilized Preoxygenation: Pre-oxygenation with 100% oxygen Induction Type: IV induction Ventilation: Mask ventilation without difficulty and Oral airway inserted - appropriate to patient size Laryngoscope Size: Hyacinth MeekerMiller and 2 Grade View: Grade I Tube type: Oral Tube size: 7.5 mm Number of attempts: 1 Airway Equipment and Method: Stylet Placement Confirmation: ETT inserted through vocal cords under direct vision,  positive ETCO2 and breath sounds checked- equal and bilateral Secured at: 22 cm Tube secured with: Tape Dental Injury: Teeth and Oropharynx as per pre-operative assessment

## 2018-05-22 ENCOUNTER — Encounter (HOSPITAL_COMMUNITY): Payer: Self-pay | Admitting: General Surgery

## 2018-05-22 LAB — CBC
HCT: 30.6 % — ABNORMAL LOW (ref 39.0–52.0)
Hemoglobin: 9.7 g/dL — ABNORMAL LOW (ref 13.0–17.0)
MCH: 25.9 pg — ABNORMAL LOW (ref 26.0–34.0)
MCHC: 31.7 g/dL (ref 30.0–36.0)
MCV: 81.6 fL (ref 78.0–100.0)
Platelets: 234 10*3/uL (ref 150–400)
RBC: 3.75 MIL/uL — ABNORMAL LOW (ref 4.22–5.81)
RDW: 15.6 % — ABNORMAL HIGH (ref 11.5–15.5)
WBC: 14.4 10*3/uL — ABNORMAL HIGH (ref 4.0–10.5)

## 2018-05-22 LAB — GLUCOSE, CAPILLARY
Glucose-Capillary: 119 mg/dL — ABNORMAL HIGH (ref 70–99)
Glucose-Capillary: 125 mg/dL — ABNORMAL HIGH (ref 70–99)
Glucose-Capillary: 126 mg/dL — ABNORMAL HIGH (ref 70–99)
Glucose-Capillary: 187 mg/dL — ABNORMAL HIGH (ref 70–99)
Glucose-Capillary: 204 mg/dL — ABNORMAL HIGH (ref 70–99)
Glucose-Capillary: 217 mg/dL — ABNORMAL HIGH (ref 70–99)
Glucose-Capillary: 224 mg/dL — ABNORMAL HIGH (ref 70–99)

## 2018-05-22 LAB — COMPREHENSIVE METABOLIC PANEL
ALT: 9 U/L (ref 0–44)
AST: 18 U/L (ref 15–41)
Albumin: 2 g/dL — ABNORMAL LOW (ref 3.5–5.0)
Alkaline Phosphatase: 85 U/L (ref 38–126)
Anion gap: 12 (ref 5–15)
BUN: 10 mg/dL (ref 8–23)
CO2: 27 mmol/L (ref 22–32)
Calcium: 7.9 mg/dL — ABNORMAL LOW (ref 8.9–10.3)
Chloride: 96 mmol/L — ABNORMAL LOW (ref 98–111)
Creatinine, Ser: 1.49 mg/dL — ABNORMAL HIGH (ref 0.61–1.24)
GFR calc Af Amer: 52 mL/min — ABNORMAL LOW (ref >60)
GFR calc non Af Amer: 44 mL/min — ABNORMAL LOW (ref >60)
Glucose, Bld: 108 mg/dL — ABNORMAL HIGH (ref 70–99)
Potassium: 3.5 mmol/L (ref 3.5–5.1)
Sodium: 135 mmol/L (ref 135–145)
Total Bilirubin: 0.8 mg/dL (ref 0.3–1.2)
Total Protein: 6 g/dL — ABNORMAL LOW (ref 6.5–8.1)

## 2018-05-22 MED ORDER — FAMOTIDINE 20 MG PO TABS
20.0000 mg | ORAL_TABLET | Freq: Two times a day (BID) | ORAL | Status: DC
  Administered 2018-05-22 – 2018-05-25 (×7): 20 mg via ORAL
  Filled 2018-05-22 (×7): qty 1

## 2018-05-22 MED ORDER — INSULIN ASPART 100 UNIT/ML ~~LOC~~ SOLN
0.0000 [IU] | Freq: Three times a day (TID) | SUBCUTANEOUS | Status: DC
  Administered 2018-05-22 (×2): 1 [IU] via SUBCUTANEOUS
  Administered 2018-05-22 – 2018-05-23 (×2): 3 [IU] via SUBCUTANEOUS
  Administered 2018-05-23: 1 [IU] via SUBCUTANEOUS
  Administered 2018-05-23 – 2018-05-24 (×2): 2 [IU] via SUBCUTANEOUS
  Administered 2018-05-25: 3 [IU] via SUBCUTANEOUS

## 2018-05-22 NOTE — Progress Notes (Signed)
Initial Nutrition Assessment  DOCUMENTATION CODES:  Not applicable  INTERVENTION:  Continue Ensure Enlive po BID, each supplement provides 350 kcal and 20 grams of protein  When able/appropriate, Would recommend re weighing patient to confirm his large degree of wt loss  NUTRITION DIAGNOSIS:  Unintentional weight loss related to Unknown etiology (poor intake vs diuresis r/t HF vs Malignancy?) as evidenced by an apparent loss of 30 lbs (15% bw) in the past 2 months  GOAL:  Patient will meet greater than or equal to 90% of their needs  MONITOR:  Supplement acceptance, PO intake, Labs, Skin, I & O's  REASON FOR ASSESSMENT:  Malnutrition Screening Tool    ASSESSMENT:  72 y/o male PMHx HF, HTN, CKD, DM2, PVD. Presents for elective cholecystectomy following a March hospitalization for severe sepsis 2/2 cholecystitis. Open cholecystectomy was attempted, but gallbladder was adherent to surrounding structures w/ desmoid reaction s/p biopsy.  On RD arrival, patient is confused and contradicts himself frequently. He is unable to report any reliable information. He does say he has not been eating well, but cannot give any time frame or specifics. He also notes he likes the Ensure. He does report wt loss, with a UBW of 180-200, but cannot say when he last weighed this.   Per chart, the patient has had significant weight loss over past couple months? He was 199-202 just in March-May, but then gradually began to lose weight. He appears to have lost ~20 lbs during month of May and now he is documented as 170 lbs?! There is no documentation of how this weight was taken. Unable to take bed weight as pt in chair. Will recommended reweight to confirm this dramatic of loss.   Of note, he was hospitalized 2x in May and the first was for Acute congestive HF. Pt significant weight loss may be a result of aggressive diuresis from that admission, instead of poor intake.   RN notes this is not the patients  baseline and suspects his AMS is related to some pain medication/relaxers he took. May be able to obtain history once AMS clears.   Physical Exam: Some areas of mild fat/muscle wasting. Not enough to dx w/ malnutrition.   Meds: H2RA, Glimepiride, Ensure Enlive, IV abx,  Labs: Bg: 108-224, WBC:14.4, Albumin:2.0, Creat:1.49, h/h:9.7/30.6  Recent Labs  Lab 05/18/18 0948 05/22/18 0659  NA 136 135  K 3.1* 3.5  CL 95* 96*  CO2 29 27  BUN 7* 10  CREATININE 1.30* 1.49*  CALCIUM 8.7* 7.9*  GLUCOSE 165* 108*   NUTRITION - FOCUSED PHYSICAL EXAM:   Most Recent Value  Orbital Region  Mild depletion  Upper Arm Region  No depletion  Thoracic and Lumbar Region  No depletion  Buccal Region  No depletion  Temple Region  Mild depletion  Clavicle Bone Region  No depletion  Clavicle and Acromion Bone Region  No depletion  Scapular Bone Region  No depletion  Dorsal Hand  No depletion  Patellar Region  No depletion  Anterior Thigh Region  No depletion  Posterior Calf Region  No depletion     Diet Order:   Diet Order           Diet Carb Modified Fluid consistency: Thin; Room service appropriate? Yes  Diet effective now         EDUCATION NEEDS:  No education needs have been identified at this time  Skin:  Skin Assessment: Skin Integrity Issues: Skin Integrity Issues:: Incisions Incisions: Abdomen  Last BM:  6/20  Height:  Ht Readings from Last 1 Encounters:  05/21/18 5\' 9"  (1.753 m)   Weight:  Wt Readings from Last 1 Encounters:  05/21/18 170 lb (77.1 kg)   Wt Readings from Last 10 Encounters:  05/21/18 170 lb (77.1 kg)  04/26/18 185 lb (83.9 kg)  04/16/18 187 lb 9.8 oz (85.1 kg)  04/01/18 197 lb 1.6 oz (89.4 kg)  01/31/18 199 lb 12.8 oz (90.6 kg)  09/05/14 198 lb 9.6 oz (90.1 kg)   Ideal Body Weight:  72.73 kg  BMI:  Body mass index is 25.1 kg/m.  Estimated Nutritional Needs:  Kcal:  1800-2000 (23-26 kcal/kg bw Protein:  85-100g Pro (1.1-1.3 g/kg bw) Fluid:  1.8-2  L fluid (1 ml/kcal bw)  Christophe LouisNathan Franks RD, LDN, CNSC Clinical Nutrition Available Tues-Sat via Pager: [phone] 05/22/2018 4:49 PM

## 2018-05-22 NOTE — Progress Notes (Signed)
Encouraged pt to get up and walk to the bathroom to void, pt unable to get because of surgical pain. Encouraged pt to void with urinal, pt unable to. Bladder scan shows 190 mls. Encouraged pt fluid intake. Will monitor pt.

## 2018-05-22 NOTE — Progress Notes (Addendum)
1 Day Post-Op  Subjective: Stable and alert.  Complains of incisional pain but reasonably well controlled with IV meds.  Anorexic.  No nausea or vomiting. No dyspnea Incontinent of urine bladder scan this morning 22 cc, so we will follow No labs   Objective: Vital signs in last 24 hours: Temp:  [97.7 F (36.5 C)-98.5 F (36.9 C)] 98.1 F (36.7 C) (06/29 0400) Pulse Rate:  [56-72] 72 (06/29 0400) Resp:  [11-18] 18 (06/29 0400) BP: (104-224)/(57-87) 140/80 (06/29 0400) SpO2:  [95 %-100 %] 98 % (06/29 0400) Weight:  [77.1 kg (170 lb)] 77.1 kg (170 lb) (06/28 0836) Last BM Date: 05/20/18  Intake/Output from previous day: 06/28 0701 - 06/29 0700 In: 1221.9 [P.O.:50; I.V.:800; IV Piggyback:296.9] Out: 137 [Drains:87; Blood:50] Intake/Output this shift: No intake/output data recorded.  General appearance: Slightly sedated but arousable, appropriate, good insight.  Wife is in room Resp: clear to auscultation bilaterally GI: Soft.  Appropriately tender.  Hypoactive bowel sounds.  Subcostal incision looks good.  JP drain is serosanguineous.  Low volume Extremities: extremities normal, atraumatic, no cyanosis or edema  Lab Results:  Results for orders placed or performed during the hospital encounter of 05/21/18 (from the past 24 hour(s))  Glucose, capillary     Status: Abnormal   Collection Time: 05/21/18  8:37 AM  Result Value Ref Range   Glucose-Capillary 282 (H) 70 - 99 mg/dL  Glucose, capillary     Status: Abnormal   Collection Time: 05/21/18 10:20 AM  Result Value Ref Range   Glucose-Capillary 192 (H) 70 - 99 mg/dL   Comment 1 Notify RN    Comment 2 Document in Chart   Glucose, capillary     Status: Abnormal   Collection Time: 05/21/18 12:18 PM  Result Value Ref Range   Glucose-Capillary 170 (H) 70 - 99 mg/dL  Glucose, capillary     Status: Abnormal   Collection Time: 05/21/18  4:16 PM  Result Value Ref Range   Glucose-Capillary 145 (H) 70 - 99 mg/dL  Glucose,  capillary     Status: Abnormal   Collection Time: 05/21/18  8:58 PM  Result Value Ref Range   Glucose-Capillary 232 (H) 70 - 99 mg/dL  Glucose, capillary     Status: Abnormal   Collection Time: 05/22/18 12:22 AM  Result Value Ref Range   Glucose-Capillary 217 (H) 70 - 99 mg/dL  Glucose, capillary     Status: Abnormal   Collection Time: 05/22/18 12:27 AM  Result Value Ref Range   Glucose-Capillary 204 (H) 70 - 99 mg/dL  Glucose, capillary     Status: Abnormal   Collection Time: 05/22/18  4:21 AM  Result Value Ref Range   Glucose-Capillary 119 (H) 70 - 99 mg/dL     Studies/Results: No results found.  Marland Kitchen. aspirin EC  81 mg Oral Daily  . brimonidine  1 drop Both Eyes BID  . feeding supplement (ENSURE ENLIVE)  237 mL Oral BID BM  . glimepiride  4 mg Oral BID WC  . heparin  5,000 Units Subcutaneous Q8H  . insulin aspart  0-9 Units Subcutaneous Q4H  . losartan  100 mg Oral Daily  . metoprolol succinate  50 mg Oral Daily  . simvastatin  40 mg Oral QHS  . timolol  1 drop Both Eyes BID     Assessment/Plan: s/p Procedure(s): ATTEMPTED LAPAROSCOPIC CHOLECYSTECTOMY, OPEN DRAINAGE OF GALLBLADDER WITH BIOPSY  POD #1.  Laparoscopic converted to open attempted cholecystectomy, gallbladder biopsy, open drainage.  Operative findings  discussed with patient and wife .  Pathology pending Mobilize out of bed  clear liquid diet Incentive spirometry PT and OT  Urinary incontinence but apparently without retention Monitor closely  Hypertension.  Currently 140/80.  Continue Toprol and losartan.  Diabetes.  Currently on Amaryl and SSI.  Most recent CBG 119.  Monitor.   @PROBHOSP @  LOS: 0 days    Ernestene Mention 05/22/2018  . .prob

## 2018-05-22 NOTE — Progress Notes (Signed)
Dr Derrell LollingIngram on the floor. Verbally ordered for In and Out cath. Upon checking with the pt, pt was incontinent to urine. Bladder scan showed 22ml. Will monitor pt.

## 2018-05-23 LAB — COMPREHENSIVE METABOLIC PANEL
ALT: 6 U/L (ref 0–44)
AST: 16 U/L (ref 15–41)
Albumin: 2 g/dL — ABNORMAL LOW (ref 3.5–5.0)
Alkaline Phosphatase: 84 U/L (ref 38–126)
Anion gap: 10 (ref 5–15)
BUN: 15 mg/dL (ref 8–23)
CO2: 30 mmol/L (ref 22–32)
Calcium: 8.1 mg/dL — ABNORMAL LOW (ref 8.9–10.3)
Chloride: 95 mmol/L — ABNORMAL LOW (ref 98–111)
Creatinine, Ser: 1.73 mg/dL — ABNORMAL HIGH (ref 0.61–1.24)
GFR calc Af Amer: 43 mL/min — ABNORMAL LOW (ref >60)
GFR calc non Af Amer: 37 mL/min — ABNORMAL LOW (ref >60)
Glucose, Bld: 94 mg/dL (ref 70–99)
Potassium: 3.4 mmol/L — ABNORMAL LOW (ref 3.5–5.1)
Sodium: 135 mmol/L (ref 135–145)
Total Bilirubin: 0.9 mg/dL (ref 0.3–1.2)
Total Protein: 6 g/dL — ABNORMAL LOW (ref 6.5–8.1)

## 2018-05-23 LAB — GLUCOSE, CAPILLARY
Glucose-Capillary: 107 mg/dL — ABNORMAL HIGH (ref 70–99)
Glucose-Capillary: 120 mg/dL — ABNORMAL HIGH (ref 70–99)
Glucose-Capillary: 210 mg/dL — ABNORMAL HIGH (ref 70–99)
Glucose-Capillary: 131 mg/dL — ABNORMAL HIGH (ref 70–99)

## 2018-05-23 LAB — CBC
HCT: 28.8 % — ABNORMAL LOW (ref 39.0–52.0)
Hemoglobin: 8.9 g/dL — ABNORMAL LOW (ref 13.0–17.0)
MCH: 25.5 pg — ABNORMAL LOW (ref 26.0–34.0)
MCHC: 30.9 g/dL (ref 30.0–36.0)
MCV: 82.5 fL (ref 78.0–100.0)
Platelets: 263 10*3/uL (ref 150–400)
RBC: 3.49 MIL/uL — ABNORMAL LOW (ref 4.22–5.81)
RDW: 15.9 % — ABNORMAL HIGH (ref 11.5–15.5)
WBC: 14.5 10*3/uL — ABNORMAL HIGH (ref 4.0–10.5)

## 2018-05-23 MED ORDER — POTASSIUM CHLORIDE 2 MEQ/ML IV SOLN
INTRAVENOUS | Status: DC
  Administered 2018-05-23 – 2018-05-25 (×4): via INTRAVENOUS
  Filled 2018-05-23 (×8): qty 1000

## 2018-05-23 MED ORDER — SODIUM CHLORIDE 0.9 % IV SOLN: INTRAVENOUS | Status: DC | PRN

## 2018-05-23 NOTE — Evaluation (Signed)
Physical Therapy Evaluation Patient Details Name: Jeremiah [name] MRN: [id number] DOB: 25-May-1946 Today's Date: 05/23/2018   History of Present Illness  Pt presents with several bouts of cholecystitis with Abx and drain placement. Attempted laparascopic cholecystectomy but gallbladder was adhered to other tissues and could not dafely be removed so drain placed and biopsy done. PMH: DM2, CHF, PVD, OSA, gout.   Clinical Impression  Pt admitted with above diagnosis. Pt currently with functional limitations due to the deficits listed below (see PT Problem List). Pt lethargic on eval but even when aroused, not oriented, not following commands or able to physically participate in session. Required max A to get to EOB and then mod A to maintain sitting. Tot A +2 needed to rise to stedy and too unsafe to transfer to chair so returned to supine. This is a large change for him from yesterday when he was able to take a few steps with RW with nursing and sit up in chair.  RN aware of change, possibly pain med related. Recommending SNF at this point. Pt will benefit from skilled PT to increase their independence and safety with mobility to allow discharge to the venue listed below.       Follow Up Recommendations SNF;Supervision/Assistance - 24 hour    Equipment Recommendations  Other (comment)(TBD)    Recommendations for Other Services OT consult;Speech consult     Precautions / Restrictions Precautions Precautions: Fall Restrictions Weight Bearing Restrictions: No Other Position/Activity Restrictions: JP drain L abdomen      Mobility  Bed Mobility Overal bed mobility: Needs Assistance Bed Mobility: Supine to Sit     Supine to sit: Max assist     General bed mobility comments: max A for rolling L and trunk elevation into sitting. Heavy  lean once sitting, requiring mod A to remain upright  Transfers Overall transfer level: Needs assistance   Transfers: Sit to/from Stand Sit to Stand:  Total assist;+2 physical assistance         General transfer comment: pt able to pull only minimally on bar to rise, tot A given by therapist and nurse tech to get flaps of stedy under pt. Continued to need mod A to maintain sitting within stedy so did not transfer him into chair  Ambulation/Gait             General Gait Details: unable  Stairs            Wheelchair Mobility    Modified Rankin (Stroke Patients Only)       Balance Overall balance assessment: Needs assistance Sitting-balance support: Bilateral upper extremity supported;Feet supported Sitting balance-Leahy Scale: Poor Sitting balance - Comments: L lean Postural control: Left lateral lean;Posterior lean Standing balance support: Bilateral upper extremity supported Standing balance-Leahy Scale: Zero                               Pertinent Vitals/Pain Pain Assessment: Faces Faces Pain Scale: Hurts little more Pain Location: abdomen Pain Descriptors / Indicators: Operative site guarding Pain Intervention(s): Limited activity within patient's tolerance;Monitored during session;Premedicated before session    Home Living Family/patient expects to be discharged to:: Private residence Living Arrangements: Spouse/significant other Available Help at Discharge: Family Type of Home: House Home Access: Stairs to enter Entrance Stairs-Rails: Doctor, general practice of Steps: 4 Home Layout: One level Home Equipment: Walker - 2 wheels Additional Comments: wife has been helping with sponge bathing due to drain  Prior Function Level of Independence: Independent with assistive device(s)         Comments: uses RW for ambulation per chart. On this admission pt cannot accurately answer any of my history questions so history obtained from admission last month     Hand Dominance        Extremity/Trunk Assessment   Upper Extremity Assessment Upper Extremity Assessment:  Generalized weakness;Difficult to assess due to impaired cognition    Lower Extremity Assessment Lower Extremity Assessment: Generalized weakness;Difficult to assess due to impaired cognition    Cervical / Trunk Assessment Cervical / Trunk Assessment: Kyphotic  Communication   Communication: Other (comment)(minimal verbalization)  Cognition Arousal/Alertness: Lethargic;Suspect due to medications Behavior During Therapy: Flat affect Overall Cognitive Status: Impaired/Different from baseline Area of Impairment: Orientation;Memory;Attention;Following commands;Safety/judgement;Awareness;Problem solving                 Orientation Level: Disoriented to;Place;Time;Situation Current Attention Level: Focused Memory: Decreased recall of precautions;Decreased short-term memory Following Commands: Follows one step commands inconsistently Safety/Judgement: Decreased awareness of safety;Decreased awareness of deficits Awareness: Intellectual Problem Solving: Slow processing;Decreased initiation;Requires verbal cues;Requires tactile cues General Comments: pt lethargic and not himself today per his brother. Cannot follow in conversation. Occasionally falling asleep but even when awake, unable to participate in conversation, answer questions, or follow commands appropriately      General Comments General comments (skin integrity, edema, etc.): per nursing, yesterday pt was able to ambulate short distance to chair with min A +2 in morning and pivot back to bed with +2 mod A in afternoon. Big change today    Exercises     Assessment/Plan    PT Assessment Patient needs continued PT services  PT Problem List Decreased strength;Decreased activity tolerance;Decreased balance;Decreased mobility;Decreased coordination;Decreased cognition;Decreased knowledge of use of DME;Decreased safety awareness;Decreased knowledge of precautions;Pain       PT Treatment Interventions DME instruction;Gait  training;Functional mobility training;Therapeutic activities;Therapeutic exercise;Balance training;Neuromuscular re-education;Cognitive remediation;Patient/family education    PT Goals (Current goals can be found in the Care Plan section)  Acute Rehab PT Goals Patient Stated Goal: none stated PT Goal Formulation: With family Time For Goal Achievement: 06/06/18 Potential to Achieve Goals: Fair    Frequency Min 3X/week   Barriers to discharge        Co-evaluation               AM-PAC PT "6 Clicks" Daily Activity  Outcome Measure Difficulty turning over in bed (including adjusting bedclothes, sheets and blankets)?: Unable Difficulty moving from lying on back to sitting on the side of the bed? : Unable Difficulty sitting down on and standing up from a chair with arms (e.g., wheelchair, bedside commode, etc,.)?: Unable Help needed moving to and from a bed to chair (including a wheelchair)?: Total Help needed walking in hospital room?: Total Help needed climbing 3-5 steps with a railing? : Total 6 Click Score: 6    End of Session Equipment Utilized During Treatment: Gait belt Activity Tolerance: Patient limited by lethargy Patient left: in bed;with call bell/phone within reach;with bed alarm set;with family/visitor present Nurse Communication: Mobility status PT Visit Diagnosis: Pain;Difficulty in walking, not elsewhere classified (R26.2);Muscle weakness (generalized) (M62.81) Pain - Right/Left: Right Pain - part of body: (abdomen)    Time: [id number] PT Time Calculation (min) (ACUTE ONLY): 28 min   Charges:   PT Evaluation $PT Eval Moderate Complexity: 1 Mod PT Treatments $Therapeutic Activity: 8-22 mins   PT G Codes:        Kelly ServicesVictoria Harbour [name],  PT  Acute Rehab Services  [phone]   Jeremiah [name] 05/23/2018, 1:57 PM

## 2018-05-23 NOTE — Progress Notes (Signed)
2 Days Post-Op  Subjective: Alert.  No obvious distress.  Soft-spoken and somewhat withdrawn. Pain seems less.  No nausea or vomiting. No stool. Continued incontinent of urine.  Bladder scan over 400 cc. Foley inserted this morning Slow to move.  Almost no ambulation yesterday.  PT consult pending.  Labs yesterday showed hemoglobin 9.7.  WBC 14.4.  LFTs normal.  Creatinine 1.49.  Potassium 3.5. Objective: Vital signs in last 24 hours: Temp:  [98.6 F (37 C)] 98.6 F (37 C) (06/29 1328) Pulse Rate:  [75-76] 76 (06/30 0508) Resp:  [16] 16 (06/30 0508) BP: (131-137)/(60-62) 131/62 (06/30 0508) SpO2:  [92 %-98 %] 92 % (06/30 0508) Last BM Date: 05/13/18  Intake/Output from previous day: 06/29 0701 - 06/30 0700 In: 2253.7 [P.O.:240; I.V.:1863.7; IV Piggyback:150] Out: 1156 [Urine:1091; Drains:65] Intake/Output this shift: No intake/output data recorded.    General appearance: Slightly sedated but arousable, appropriate, good insight.  Wife is in room Resp: clear to auscultation bilaterally GI: Soft.  Appropriately tender.  Hypoactive bowel sounds.  Subcostal incision looks good.  JP drain is serosanguineous.  Low volume-65 cc out last 24 hours Extremities: extremities normal, atraumatic, no cyanosis or edema     Lab Results:  Results for orders placed or performed during the hospital encounter of 05/21/18 (from the past 24 hour(s))  Glucose, capillary     Status: Abnormal   Collection Time: 05/22/18  8:31 AM  Result Value Ref Range   Glucose-Capillary 125 (H) 70 - 99 mg/dL  Glucose, capillary     Status: Abnormal   Collection Time: 05/22/18 12:06 PM  Result Value Ref Range   Glucose-Capillary 224 (H) 70 - 99 mg/dL  Glucose, capillary     Status: Abnormal   Collection Time: 05/22/18  5:17 PM  Result Value Ref Range   Glucose-Capillary 126 (H) 70 - 99 mg/dL  Glucose, capillary     Status: Abnormal   Collection Time: 05/22/18  9:51 PM  Result Value Ref Range   Glucose-Capillary 187 (H) 70 - 99 mg/dL  Glucose, capillary     Status: Abnormal   Collection Time: 05/23/18  7:40 AM  Result Value Ref Range   Glucose-Capillary 107 (H) 70 - 99 mg/dL     Studies/Results: No results found.  Marland Kitchen aspirin EC  81 mg Oral Daily  . brimonidine  1 drop Both Eyes BID  . famotidine  20 mg Oral BID  . feeding supplement (ENSURE ENLIVE)  237 mL Oral BID BM  . glimepiride  4 mg Oral BID WC  . heparin  5,000 Units Subcutaneous Q8H  . insulin aspart  0-9 Units Subcutaneous TID AC & HS  . losartan  100 mg Oral Daily  . metoprolol succinate  50 mg Oral Daily  . simvastatin  40 mg Oral QHS  . timolol  1 drop Both Eyes BID     Assessment/Plan: s/p Procedure(s): ATTEMPTED LAPAROSCOPIC CHOLECYSTECTOMY, OPEN DRAINAGE OF GALLBLADDER WITH BIOPSY   POD #2.  Laparoscopic converted to open attempted cholecystectomy, gallbladder biopsy, open drainage.  Operative findings discussed with patient and wife .  Pathology pending Mobilize out of bed liquid diet Incentive spirometry PT and OT  Urinary incontinence and urinary retention.--Foley catheter inserted successfully this morning.  He will probably need this until he is more ambulatory.  Hypertension.  Currently 131/62.  Continue Toprol and losartan.  Diabetes.  Currently on Amaryl and SSI.  Most recent CBG 107.   Monitor.  Borderline hypokalemia.  Add a little bit  of potassium to his IV    @PROBHOSP @  LOS: 0 days    Jeremiah [name] 05/23/2018  . .prob

## 2018-05-24 DIAGNOSIS — I509 Heart failure, unspecified: Secondary | ICD-10-CM | POA: Diagnosis present

## 2018-05-24 DIAGNOSIS — Z5331 Laparoscopic surgical procedure converted to open procedure: Secondary | ICD-10-CM | POA: Diagnosis not present

## 2018-05-24 DIAGNOSIS — I110 Hypertensive heart disease with heart failure: Secondary | ICD-10-CM | POA: Diagnosis present

## 2018-05-24 DIAGNOSIS — R32 Unspecified urinary incontinence: Secondary | ICD-10-CM | POA: Diagnosis not present

## 2018-05-24 DIAGNOSIS — E1151 Type 2 diabetes mellitus with diabetic peripheral angiopathy without gangrene: Secondary | ICD-10-CM | POA: Diagnosis present

## 2018-05-24 DIAGNOSIS — Z87891 Personal history of nicotine dependence: Secondary | ICD-10-CM | POA: Diagnosis not present

## 2018-05-24 DIAGNOSIS — Z7984 Long term (current) use of oral hypoglycemic drugs: Secondary | ICD-10-CM | POA: Diagnosis not present

## 2018-05-24 DIAGNOSIS — K819 Cholecystitis, unspecified: Secondary | ICD-10-CM | POA: Diagnosis present

## 2018-05-24 DIAGNOSIS — R5381 Other malaise: Secondary | ICD-10-CM | POA: Diagnosis present

## 2018-05-24 DIAGNOSIS — R339 Retention of urine, unspecified: Secondary | ICD-10-CM | POA: Diagnosis not present

## 2018-05-24 DIAGNOSIS — Z79899 Other long term (current) drug therapy: Secondary | ICD-10-CM | POA: Diagnosis not present

## 2018-05-24 DIAGNOSIS — G4730 Sleep apnea, unspecified: Secondary | ICD-10-CM | POA: Diagnosis present

## 2018-05-24 DIAGNOSIS — K8010 Calculus of gallbladder with chronic cholecystitis without obstruction: Secondary | ICD-10-CM | POA: Diagnosis present

## 2018-05-24 DIAGNOSIS — E876 Hypokalemia: Secondary | ICD-10-CM | POA: Diagnosis not present

## 2018-05-24 LAB — GLUCOSE, CAPILLARY
Glucose-Capillary: 97 mg/dL (ref 70–99)
Glucose-Capillary: 177 mg/dL — ABNORMAL HIGH (ref 70–99)
Glucose-Capillary: 100 mg/dL — ABNORMAL HIGH (ref 70–99)
Glucose-Capillary: 112 mg/dL — ABNORMAL HIGH (ref 70–99)
Glucose-Capillary: 58 mg/dL — ABNORMAL LOW (ref 70–99)

## 2018-05-24 MED ORDER — HYDROCODONE-ACETAMINOPHEN 5-325 MG PO TABS: 1.0000 | ORAL_TABLET | Freq: Four times a day (QID) | ORAL | 0 refills | Status: DC | PRN

## 2018-05-24 NOTE — Evaluation (Signed)
Occupational Therapy Evaluation Patient Details Name: Jeremiah [name] L Teodoro MRN: [id number] DOB: 1946/01/26 Today's Date: 05/24/2018    History of Present Illness Pt presents with several bouts of cholecystitis with Abx and drain placement. Attempted laparascopic cholecystectomy but gallbladder was adhered to other tissues and could not safely be removed so drain placed and biopsy done. PMH: DM2, CHF, PVD, OSA, gout.    Clinical Impression   Pt was assisted in the day leading up to this admission, but is typically independent. Pt is generally weak and demonstrates poor balance. Abdominal pain also limiting pt's ability to perform mobility and ADL at baseline. Will follow acutely. Wife is adamant she will take pt home.    Follow Up Recommendations  Home health OT;Supervision/Assistance - 24 hour(family is refusing SNF)    Equipment Recommendations  3 in 1 bedside commode    Recommendations for Other Services       Precautions / Restrictions Precautions Precautions: Fall Restrictions Weight Bearing Restrictions: No Other Position/Activity Restrictions: JP drain L abdomen      Mobility Bed Mobility Overal bed mobility: Needs Assistance Bed Mobility: Sit to Supine       Sit to supine: Mod assist   General bed mobility comments: assist for LEs  Transfers Overall transfer level: Needs assistance Equipment used: Rolling walker (2 wheeled) Transfers: Sit to/from BJ'sStand;Stand Pivot Transfers Sit to Stand: +2 physical assistance;Min assist Stand pivot transfers: +2 physical assistance;Min assist       General transfer comment: assist to rise and steady, cues for hand placement    Balance Overall balance assessment: Needs assistance Sitting-balance support: Bilateral upper extremity supported;Feet supported Sitting balance-Leahy Scale: Fair     Standing balance support: Bilateral upper extremity supported Standing balance-Leahy Scale: Poor                              ADL either performed or assessed with clinical judgement   ADL Overall ADL's : Needs assistance/impaired Eating/Feeding: Set up;Sitting   Grooming: Wash/dry hands;Wash/dry face;Sitting;Min guard   Upper Body Bathing: Minimal assistance;Sitting   Lower Body Bathing: Maximal assistance;Sit to/from stand   Upper Body Dressing : Minimal assistance;Sitting   Lower Body Dressing: Maximal assistance;Sit to/from stand   Toilet Transfer: +2 for physical assistance;Minimal assistance;Stand-pivot;BSC;RW   Toileting- Clothing Manipulation and Hygiene: Total assistance;Sit to/from stand               Vision Baseline Vision/History: Wears glasses Wears Glasses: At all times Patient Visual Report: No change from baseline       Perception     Praxis      Pertinent Vitals/Pain Pain Assessment: Faces Faces Pain Scale: Hurts even more Pain Location: abdomen Pain Descriptors / Indicators: Operative site guarding;Grimacing Pain Intervention(s): Repositioned;Monitored during session(instructed in use of pillow to splint)     Hand Dominance Right   Extremity/Trunk Assessment Upper Extremity Assessment Upper Extremity Assessment: Generalized weakness   Lower Extremity Assessment Lower Extremity Assessment: Defer to PT evaluation   Cervical / Trunk Assessment Cervical / Trunk Assessment: Kyphotic   Communication Communication Communication: No difficulties   Cognition Arousal/Alertness: Awake/alert Behavior During Therapy: Flat affect Overall Cognitive Status: Impaired/Different from baseline Area of Impairment: Following commands;Problem solving                       Following Commands: Follows one step commands with increased time     Problem Solving: Slow processing;Decreased initiation;Requires verbal cues;Requires  tactile cues General Comments: pt awake, but falls asleep as soon as he returns to supine   General Comments       Exercises     Shoulder  Instructions      Home Living Family/patient expects to be discharged to:: Private residence Living Arrangements: Spouse/significant other Available Help at Discharge: Family;Available PRN/intermittently(wife goes to dialysis M-W-F) Type of Home: House Home Access: Stairs to enter [employer] of Steps: 4 Entrance Stairs-Rails: Right;Left Home Layout: One level     Bathroom Shower/Tub: Producer, television/film/video: Handicapped height     Home Equipment: Environmental consultant - 2 wheels;Shower seat   Additional Comments: wife has been helping with sponge bathing due to drain      Prior Functioning/Environment Level of Independence: Independent with assistive device(s)        Comments: has been using a RW and wife assists as needed with ADL lately, but typically pt is independent        OT Problem List: Decreased strength;Decreased activity tolerance;Impaired balance (sitting and/or standing);Decreased knowledge of use of DME or AE;Pain      OT Treatment/Interventions: Self-care/ADL training;DME and/or AE instruction;Patient/family education;Balance training;Therapeutic activities    OT Goals(Current goals can be found in the care plan section) Acute Rehab OT Goals Patient Stated Goal: to return home OT Goal Formulation: With patient Time For Goal Achievement: 06/07/18 Potential to Achieve Goals: Good ADL Goals Pt Will Perform Grooming: (P) with supervision;standing Pt Will Perform Upper Body Dressing: (P) with supervision;sitting Pt Will Perform Lower Body Dressing: (P) with min assist;sit to/from stand Pt Will Transfer to Toilet: (P) with supervision;ambulating;bedside commode(over toilet) Pt Will Perform Toileting - Clothing Manipulation and hygiene: (P) with supervision;sit to/from stand Additional ADL Goal #1: (P) Pt will perform bed mobility with supervision in preparation for ADL.  OT Frequency: Min 2X/week   Barriers to D/C:            Co-evaluation               AM-PAC PT "6 Clicks" Daily Activity     Outcome Measure Help from another person eating meals?: A Little Help from another person taking care of personal grooming?: A Little Help from another person toileting, which includes using toliet, bedpan, or urinal?: A Lot Help from another person bathing (including washing, rinsing, drying)?: A Lot   Help from another person to put on and taking off regular lower body clothing?: A Little 6 Click Score: 13   End of Session    Activity Tolerance: Patient limited by pain Patient left: in bed;with call bell/phone within reach;with bed alarm set;with family/visitor present  OT Visit Diagnosis: Unsteadiness on feet (R26.81);Other abnormalities of gait and mobility (R26.89);Pain                Time: 1610-9604 OT Time Calculation (min): 15 min Charges:  OT General Charges $OT Visit: 1 Visit OT Evaluation $OT Eval Moderate Complexity: 1 Mod G-Codes:     06/08/18 Martie Round, OTR/L Pager: [phone] [place], [place] Bailiff 06/08/18, 4:04 PM

## 2018-05-24 NOTE — NC FL2 (Signed)
MEDICAID FL2 LEVEL OF CARE SCREENING TOOL     IDENTIFICATION  Patient Name: Jeremiah [name] Birthdate: 1946/11/23 Sex: male Admission Date (Current Location): 05/21/2018  [facility] and [place] Number:  Producer, television/film/video and Address:  The . [facility], [address], [place], [place] [address]      Provider Number: [id number]  Attending Physician Name and Address:  Griselda Miner, MD  Relative Name and Phone Number:  Antwone Capozzoli, wife, [phone]    Current Level of Care: Hospital Recommended Level of Care: Skilled Nursing Facility Prior Approval Number:    Date Approved/Denied:   PASRR Number: [id number]  Discharge Plan: SNF    Current Diagnoses: Patient Active Problem List   Diagnosis Date Noted  . Cholecystitis 05/24/2018  . Cholecystitis with cholelithiasis 05/21/2018  . Acute systolic heart failure (HCC) 03/27/2018  . Diabetes mellitus without complication (HCC) 01/30/2018  . Hypertension 01/30/2018  . Acute cholecystitis 01/30/2018  . AKI (acute kidney injury) (HCC) 01/30/2018  . Normocytic anemia 01/30/2018  . PVD (peripheral vascular disease) (HCC) 09/05/2014    Orientation RESPIRATION BLADDER Height & Weight     Self, Time, Situation, Place  Normal Incontinent, Indwelling catheter Weight: 170 lb (77.1 kg) Height:  5\' 9"  (175.3 cm)  BEHAVIORAL SYMPTOMS/MOOD NEUROLOGICAL BOWEL NUTRITION STATUS      Incontinent Diet(see discharge summary)  AMBULATORY STATUS COMMUNICATION OF NEEDS Skin   Extensive Assist Verbally Surgical wounds, Other (Comment)(incision on abdomen; JP drain)                       Personal Care Assistance Level of Assistance  Bathing, Feeding, Dressing Bathing Assistance: Maximum assistance Feeding assistance: Limited assistance Dressing Assistance: Maximum assistance     Functional Limitations Info  Speech, Hearing, Sight Sight Info: Adequate Hearing Info: Adequate Speech Info: Adequate     SPECIAL CARE FACTORS FREQUENCY  OT (By licensed OT), PT (By licensed PT)     PT Frequency: 5x week OT Frequency: 5x week            Contractures Contractures Info: Not present    Additional Factors Info  Code Status, Allergies Code Status Info: Full Code Allergies Info: No Known Allergies           Current Medications (05/24/2018):  This is the current hospital active medication list Current Facility-Administered Medications  Medication Dose Route Frequency Provider Last Rate Last Dose  . 0.9 %  sodium chloride infusion   Intravenous Continuous Chevis Pretty III, MD 10 mL/hr at 05/23/18 1950    . aspirin EC tablet 81 mg  81 mg Oral Daily Chevis Pretty III, MD   81 mg at 05/24/18 8469  . brimonidine (ALPHAGAN) 0.2 % ophthalmic solution 1 drop  1 drop Both Eyes BID Chevis Pretty III, MD   1 drop at 05/24/18 0943  . famotidine (PEPCID) tablet 20 mg  20 mg Oral BID Chevis Pretty III, MD   20 mg at 05/24/18 0942  . feeding supplement (ENSURE ENLIVE) (ENSURE ENLIVE) liquid 237 mL  237 mL Oral BID BM Chevis Pretty III, MD   237 mL at 05/24/18 0942  . glimepiride (AMARYL) tablet 4 mg  4 mg Oral BID WC Chevis Pretty III, MD   4 mg at 05/24/18 0943  . heparin injection 5,000 Units  5,000 Units Subcutaneous Q8H Chevis Pretty III, MD   5,000 Units at 05/24/18 1340  . HYDROcodone-acetaminophen (NORCO/VICODIN) 5-325 MG per tablet 1 tablet  1 tablet Oral Q4H PRN Chevis Prettyoth, Paul III, MD   1 tablet at 05/23/18 [id number]  . insulin aspart (novoLOG) injection 0-9 Units  0-9 Units Subcutaneous TID AC & HS Claud KelpIngram, [place]wood, MD   2 Units at 05/24/18 1210  . lactated ringers 1,000 mL with potassium chloride 20 mEq infusion   Intravenous Continuous Claud KelpIngram, Haywood, MD 75 mL/hr at 05/24/18 0600    . losartan (COZAAR) tablet 100 mg  100 mg Oral Daily Chevis Prettyoth, Paul III, MD   100 mg at 05/24/18 0942  . methocarbamol (ROBAXIN) tablet 500 mg  500 mg Oral Q6H PRN Chevis Prettyoth, Paul III, MD   500 mg at 05/24/18 1142  . metoprolol succinate  (TOPROL-XL) 24 hr tablet 50 mg  50 mg Oral Daily Chevis Prettyoth, Paul III, MD   50 mg at 05/24/18 0943  . morphine 2 MG/ML injection 1-4 mg  1-4 mg Intravenous Q1H PRN Chevis Prettyoth, Paul III, MD      . ondansetron (ZOFRAN-ODT) disintegrating tablet 4 mg  4 mg Oral Q6H PRN Chevis Prettyoth, Paul III, MD       Or  . ondansetron [facility](ZOFRAN) injection 4 mg  4 mg Intravenous Q6H PRN Chevis Prettyoth, Paul III, MD   4 mg at 05/22/18 0453  . piperacillin-tazobactam (ZOSYN) IVPB 3.375 g  3.375 g Intravenous Q8H Chevis Prettyoth, Paul III, MD 12.5 mL/hr at 05/24/18 1143 3.375 g at 05/24/18 1143  . simvastatin (ZOCOR) tablet 40 mg  40 mg Oral QHS Chevis Prettyoth, Paul III, MD   40 mg at 05/23/18 2212  . timolol (TIMOPTIC) 0.5 % ophthalmic solution 1 drop  1 drop Both Eyes BID Chevis Prettyoth, Paul III, MD   1 drop at 05/24/18 [id number]     Discharge Medications: Please see discharge summary for a list of discharge medications.  Relevant Imaging Results:  Relevant Lab Results:   Additional Information SS[id number]  Leiah Giannotti H [place], [place]

## 2018-05-24 NOTE — Clinical Social Work Note (Signed)
Clinical Social Work Assessment  Patient Details  Name: Jeremiah [name] MRN: [id number] Date of Birth: 01/29/1946  Date of referral:  05/24/18               Reason for consult:  Facility Placement, Discharge Planning                Permission sought to share information with:  Case Manager, Family Supports Permission granted to share information::  Yes, Verbal Permission Granted  Name::     Eydan Chianese  Agency::     Relationship::  wife  Contact Information:  [phone]  Housing/Transportation Living arrangements for the past 2 months:  Single Family Home Source of Information:  Patient, Spouse Patient Interpreter Needed:  None Criminal Activity/Legal Involvement Pertinent to Current Situation/Hospitalization:  No - Comment as needed Significant Relationships:  Adult Children, Community Support, Spouse Lives with:  Spouse Do you feel safe going back to the place where you live?  Yes Need for family participation in patient care:  Yes (Comment)  Care giving concerns:Pt requiring assistance with ADLs and IADLs, uses a walker at home, has assistance from wife and has two adult sons that live in the area.    Social Worker assessment / plan:  CSW met with pt and pt wife at bedside. Pt and pt wife live in [place] and support each other with needs. CSW explained purpose of visit and recommendations from therapy staff. Pt and pt wife state understanding, pt wife and pt state they prefer to go home- "I don't want to stick him in a nursing home."   CSW expressed that pt and pt wife are able to express preferences and we will work to have home health arranged instead. Pt wife states that pt has equipment at home and has used [facility]. CSW understanding of preference, will alert RN Case Manager and sign off.   Employment status:  Retired Nurse, adult PT Recommendations:  [place], [place] / Referral to community  resources:  [place]  Patient/Family's Response to care: Pt and pt wife pleasant, open to [place] visit. Understanding of recommendations for PT/OT but want to go home.   Patient/Family's Understanding of and Emotional Response to Diagnosis, Current Treatment, and Prognosis:  Pt and pt family understanding of diagnosis, current treatment and prognosis. Pt and pt wife are determined to return home and seem to have a caring relationship where they look out for each other. All questions asked and answered.   Emotional Assessment Appearance:  Appears stated age Attitude/Demeanor/Rapport:  Gracious Affect (typically observed):  Appropriate, Pleasant, Quiet Orientation:  Oriented to Self, Oriented to Place, Oriented to Situation, Oriented to  Time Alcohol / Substance use:  Not Applicable Psych involvement (Current and /or in the community):  No (Comment)  Discharge Needs  Concerns to be addressed:  Care Coordination, Discharge Planning Concerns Readmission within the last 30 days:  No Current discharge risk:  Dependent with Mobility Barriers to Discharge:  Continued Medical Work up, [employer], [place] 05/24/2018, 3:33 PM

## 2018-05-24 NOTE — Progress Notes (Signed)
3 Days Post-Op   Subjective/Chief Complaint: Complains of soreness along upper abdomen   Objective: Vital signs in last 24 hours: Temp:  [98.2 F (36.8 C)-99.6 F (37.6 C)] 99.6 F (37.6 C) (07/01 0525) Pulse Rate:  [62-75] 62 (07/01 0525) Resp:  [16-17] 17 (07/01 0525) BP: (132-167)/(54-67) 143/54 (07/01 0525) SpO2:  [94 %-97 %] 97 % (07/01 0525) Last BM Date: 05/20/18  Intake/Output from previous day: 06/30 0701 - 07/01 0700 In: 2557.4 [P.O.:780; I.V.:1623.8; IV Piggyback:153.6] Out: 1105 [Urine:1100; Drains:5] Intake/Output this shift: No intake/output data recorded.  General appearance: alert and cooperative Resp: clear to auscultation bilaterally Cardio: regular rate and rhythm GI: soft, mild tenderness. incision ok. drain output not bilious  Lab Results:  Recent Labs    05/22/18 0659 05/23/18 0807  WBC 14.4* 14.5*  HGB 9.7* 8.9*  HCT 30.6* 28.8*  PLT 234 263   BMET Recent Labs    05/22/18 0659 05/23/18 0807  NA 135 135  K 3.5 3.4*  CL 96* 95*  CO2 27 30  GLUCOSE 108* 94  BUN 10 15  CREATININE 1.49* 1.73*  CALCIUM 7.9* 8.1*   PT/INR No results for input(s): LABPROT, INR in the last 72 hours. ABG No results for input(s): PHART, HCO3 in the last 72 hours.  Invalid input(s): PCO2, PO2  Studies/Results: No results found.  Anti-infectives: Anti-infectives (From admission, onward)   Start     Dose/Rate Route Frequency Ordered Stop   05/21/18 2000  piperacillin-tazobactam (ZOSYN) IVPB 3.375 g     3.375 g 12.5 mL/hr over 240 Minutes Intravenous Every 8 hours 05/21/18 1359     05/21/18 0845  ceFAZolin (ANCEF) IVPB 2g/100 mL premix     2 g 200 mL/hr over 30 Minutes Intravenous On call to O.R. 05/21/18 [id number] 05/21/18 1042   05/21/18 0700  metroNIDAZOLE (FLAGYL) IVPB 500 mg     500 mg 100 mL/hr over 60 Minutes Intravenous To Short Stay 05/21/18 0655 05/21/18 1048      Assessment/Plan: s/p Procedure(s): ATTEMPTED LAPAROSCOPIC CHOLECYSTECTOMY,  OPEN DRAINAGE OF GALLBLADDER WITH BIOPSY (N/A) Advance diet  Plan for discharge once he can take adequate oral nutrition and ambulate Await path Continue drain  LOS: 0 days    TOTH III,Ruby Dilone S 05/24/2018

## 2018-05-24 NOTE — Care Management Note (Addendum)
Case Management Note  Patient Details  Name: Jeremiah [name] MRN: [id number] Date of Birth: 08/11/1946  Subjective/Objective:                    Action/Plan:[facility] with [facility], and RN , will need resumption of care orders.   Discussed discharge planning with patient and wife at bedside. Both aware PT recommendations is for SNF. Both decline SNF and want to go home with home health services through [facility].   Will need home health orders and face to face.   Patient already has shower chair, walker and cane at home. Expected Discharge Date:                  Expected Discharge Plan:  Home w Home Health Services  In-House Referral:  Clinical Social Work  Discharge planning Services  CM Consult  Post Acute Care Choice:  NA Choice offered to:  Patient, Spouse  DME Arranged:  N/A DME Agency:  NA  HH Arranged:    HH Agency:  [facility]  Status of Service:  In process, will continue to follow  If discussed at Long Length of Stay Meetings, dates discussed:    Additional Comments:  Kingsley PlanWile, [name], RN 05/24/2018, 3:08 PM

## 2018-05-25 LAB — GLUCOSE, CAPILLARY
Glucose-Capillary: 72 mg/dL (ref 70–99)
Glucose-Capillary: 244 mg/dL — ABNORMAL HIGH (ref 70–99)

## 2018-05-25 NOTE — Care Management Note (Signed)
Case Management Note  Patient Details  Name: Curley SpiceWalter L Mintzer MRN: [id number] Date of Birth: 09-02-1946  Subjective/Objective:                    Action/Plan:   Expected Discharge Date:  05/25/18               Expected Discharge Plan:  Home w Home Health Services  In-House Referral:  Clinical Social Work  Discharge planning Services  CM Consult  Post Acute Care Choice:  NA Choice offered to:  Patient, Spouse  DME Arranged:  N/A DME Agency:  NA  HH Arranged:  PT, RN HH Agency:  [facility]  Status of Service:  Completed, signed off  If discussed at Long Length of Stay Meetings, dates discussed:    Additional Comments:  Kingsley PlanWile, Daijha Leggio Marie, RN 05/25/2018, 2:52 PM

## 2018-05-25 NOTE — Plan of Care (Signed)
  Problem: Education: Goal: Knowledge of General Education information will improve Outcome: Progressing Note:  POC reviewed with pt./wife; encouraged pt. to drink Ensure as ordered esp. due to CBG being low, and appetite decreased; will help with increase protein and wound healing.

## 2018-05-25 NOTE — Progress Notes (Signed)
Hypoglycemic Event  CBG:58 Treatment: 15 GM carbohydrate snack  Symptoms: None  Follow-up CBG: Time:2226 CBG Result:112  Possible Reasons for Event: Inadequate meal intake  Comments/MD notified:no    Derrel Nipobinson, Celedonio Sortino Goode

## 2018-05-25 NOTE — Progress Notes (Signed)
Physical Therapy Treatment Patient Details Name: Jeremiah [name] L Chasen MRN: [id number] DOB: 1946-06-10 Today's Date: 05/25/2018    History of Present Illness Pt presents with several bouts of cholecystitis with Abx and drain placement. Attempted laparascopic cholecystectomy but gallbladder was adhered to other tissues and could not safely be removed so drain placed and biopsy done. PMH: DM2, CHF, PVD, OSA, gout.     PT Comments    Pt much improved from Sunday's session though still confused about situation and what is going on. Was less anxious once son arrived. Pt still needs mod A for sit to stand though son reports this is his baseline. Once up, ambulated 100' with RW and min A. Family reports they can be with him at home 24/7. PT will continue to follow.    Follow Up Recommendations  Supervision/Assistance - 24 hour;Home health PT     Equipment Recommendations  3in1 (PT)    Recommendations for Other Services OT consult;Speech consult     Precautions / Restrictions Precautions Precautions: Fall Restrictions Weight Bearing Restrictions: No Other Position/Activity Restrictions: JP drain L abdomen    Mobility  Bed Mobility               General bed mobility comments: pt received in chair  Transfers Overall transfer level: Needs assistance Equipment used: Rolling walker (2 wheeled) Transfers: Sit to/from Stand Sit to Stand: Mod assist         General transfer comment: let pt attempt sit to stand independently and he was unable. Mod A needed for power up and fwd wt shift  Ambulation/Gait Ambulation/Gait assistance: Min assist Gait Distance (Feet): 100 Feet Assistive device: Rolling walker (2 wheeled) Gait Pattern/deviations: Step-through pattern;Shuffle;Trunk flexed Gait velocity: decreased Gait velocity interpretation: <1.31 ft/sec, indicative of household ambulator General Gait Details: pt needed frequent vc's for fwd gaze. Decreased step height and pt c/o fatigue  several times though he kept ambulating.    Stairs             Wheelchair Mobility    Modified Rankin (Stroke Patients Only)       Balance Overall balance assessment: Needs assistance Sitting-balance support: Bilateral upper extremity supported;Feet supported Sitting balance-Leahy Scale: Fair     Standing balance support: Bilateral upper extremity supported Standing balance-Leahy Scale: Poor Standing balance comment: reliant on UE support                            Cognition Arousal/Alertness: Awake/alert Behavior During Therapy: Flat affect Overall Cognitive Status: Impaired/Different from baseline Area of Impairment: Problem solving;Memory                 Orientation Level: Disoriented to;Time;Situation Current Attention Level: Sustained Memory: Decreased short-term memory Following Commands: Follows multi-step commands with increased time Safety/Judgement: Decreased awareness of deficits Awareness: Emergent Problem Solving: Slow processing;Decreased initiation;Requires verbal cues;Requires tactile cues General Comments: pt more alert today than visit on 6/30 but still confused. Son reports he was better this morning and this is new since surgery. Pt asked same questions repeatedly and was quite anxious. Improved somewhat when his son arrived       Exercises General Exercises - Lower Extremity Ankle Circles/Pumps: AROM;Both;10 reps;Seated    General Comments        Pertinent Vitals/Pain Pain Assessment: No/denies pain    Home Living                      Prior  Function            PT Goals (current goals can now be found in the care plan section) Acute Rehab PT Goals Patient Stated Goal: to return home PT Goal Formulation: With patient/family Time For Goal Achievement: 06/06/18 Potential to Achieve Goals: Fair Progress towards PT goals: Progressing toward goals    Frequency    Min 3X/week      PT Plan Discharge  plan needs to be updated    Co-evaluation              AM-PAC PT "6 Clicks" Daily Activity  Outcome Measure  Difficulty turning over in bed (including adjusting bedclothes, sheets and blankets)?: Unable Difficulty moving from lying on back to sitting on the side of the bed? : Unable Difficulty sitting down on and standing up from a chair with arms (e.g., wheelchair, bedside commode, etc,.)?: Unable Help needed moving to and from a bed to chair (including a wheelchair)?: A Lot Help needed walking in hospital room?: A Little Help needed climbing 3-5 steps with a railing? : A Lot 6 Click Score: 10    End of Session Equipment Utilized During Treatment: Gait belt Activity Tolerance: Patient limited by lethargy Patient left: with call bell/phone within reach;with family/visitor present;in chair Nurse Communication: Mobility status PT Visit Diagnosis: Pain;Difficulty in walking, not elsewhere classified (R26.2);Muscle weakness (generalized) (M62.81) Pain - Right/Left: Right Pain - part of body: (abdomen)     Time: 1610-9604 PT Time Calculation (min) (ACUTE ONLY): 28 min  Charges:  $Gait Training: 8-22 mins $Therapeutic Activity: 8-22 mins                    G Codes:       Lyanne Co, PT  Acute Rehab Services  [phone]    Lawana Chambers Sharifah Champine 05/25/2018, 3:41 PM

## 2018-05-25 NOTE — Progress Notes (Signed)
4 Days Post-Op   Subjective/Chief Complaint: Feels a little better. Walking and having bm's   Objective: Vital signs in last 24 hours: Temp:  [98 F (36.7 C)-99.3 F (37.4 C)] 98.8 F (37.1 C) (07/02 1308) Pulse Rate:  [59-70] 61 (07/02 1308) Resp:  [12-16] 12 (07/02 1308) BP: (145-180)/(58-72) 180/72 (07/02 1308) SpO2:  [92 %-98 %] 98 % (07/02 1308) Weight:  [77.1 kg (170 lb)] 77.1 kg (170 lb) (07/02 1104) Last BM Date: 05/24/18  Intake/Output from previous day: 07/01 0701 - 07/02 0700 In: 2307.5 [P.O.:595; I.V.:1574.6; IV Piggyback:137.9] Out: 1600 [Urine:1600] Intake/Output this shift: Total I/O In: 467 [P.O.:467] Out: 700 [Urine:700]  General appearance: alert and cooperative Resp: clear to auscultation bilaterally Cardio: regular rate and rhythm GI: soft, mild tenderness. drain output non bilious  Lab Results:  Recent Labs    05/23/18 0807  WBC 14.5*  HGB 8.9*  HCT 28.8*  PLT 263   BMET Recent Labs    05/23/18 0807  NA 135  K 3.4*  CL 95*  CO2 30  GLUCOSE 94  BUN 15  CREATININE 1.73*  CALCIUM 8.1*   PT/INR No results for input(s): LABPROT, INR in the last 72 hours. ABG No results for input(s): PHART, HCO3 in the last 72 hours.  Invalid input(s): PCO2, PO2  Studies/Results: No results found.  Anti-infectives: Anti-infectives (From admission, onward)   Start     Dose/Rate Route Frequency Ordered Stop   05/21/18 2000  piperacillin-tazobactam (ZOSYN) IVPB 3.375 g     3.375 g 12.5 mL/hr over 240 Minutes Intravenous Every 8 hours 05/21/18 1359     05/21/18 0845  ceFAZolin (ANCEF) IVPB 2g/100 mL premix     2 g 200 mL/hr over 30 Minutes Intravenous On call to O.R. 05/21/18 [id number] 05/21/18 1042   05/21/18 0700  metroNIDAZOLE (FLAGYL) IVPB 500 mg     500 mg 100 mL/hr over 60 Minutes Intravenous To Short Stay 05/21/18 0655 05/21/18 1048      Assessment/Plan: s/p Procedure(s): ATTEMPTED LAPAROSCOPIC CHOLECYSTECTOMY, OPEN DRAINAGE OF GALLBLADDER  WITH BIOPSY (N/A) Advance diet  Plan for discharge today  LOS: 1 day    TOTH III,Tahje Borawski S 05/25/2018

## 2018-05-25 NOTE — Progress Notes (Signed)
Patient discharged to home. Verbalizes understanding of all discharge instructions including incision care, discharge medications, and follow up MD visits. Patient accompanied by spouse.  

## 2018-05-25 NOTE — Progress Notes (Signed)
Inpatient Diabetes Program Recommendations  AACE/ADA: New Consensus Statement on Inpatient Glycemic Control (2015)  Target Ranges:  Prepandial:   less than 140 mg/dL      Peak postprandial:   less than 180 mg/dL (1-2 hours)      Critically ill patients:  140 - 180 mg/dL   Results for Jeremiah [name], Jeremiah [name] (MRN [id number]) as of 05/25/2018 11:54  Ref. Range 05/24/2018 08:21 05/24/2018 11:56 05/24/2018 17:09 05/24/2018 21:45 05/24/2018 22:26 05/25/2018 07:58  Glucose-Capillary Latest Ref Range: 70 - 99 mg/dL 97 409177 (H) 811100 (H) 58 ([name]) 112 (H) 72   Review of Glycemic Control  Diabetes history: [name] Outpatient Diabetes medications: Amaryl 4mg  BID Current orders for Inpatient glycemic control: Novolog 0-9 units tid + Novolog HS scale 0-5 units, Amaryl 4 mg BID  Inpatient Diabetes Program Recommendations:    Hypoglycemia last night. Patient on oral Amaryl, which is a class of medication known for hypoglycemia.  Please reduce Amaryl dose to 2 mg BID if patient remains at hospital for another day.  Thanks,  Christena DeemShannon Aaleah Hirsch RN, MSN, BC-ADM, [facility] Inpatient Diabetes Coordinator Team Pager [phone] (8a-5p)

## 2018-05-28 NOTE — Discharge Summary (Signed)
Physician Discharge Summary  Patient ID: Jeremiah [name] MRN: [id number] DOB/AGE: 1946/10/29 72 y.o.  Admit date: 05/21/2018 Discharge date: 05/28/2018  Admission Diagnoses:  Discharge Diagnoses:  Active Problems:   Cholecystitis with cholelithiasis   Cholecystitis   Discharged Condition: fair  Hospital Course: the pt underwent open drainage of the gallbladder with biopsy. Path showed xanthogranulomatous cholecystitis. He was somewhat debilitated and malnourished. He improved in the hospital and was eventually able to be discharged with home PT and a drain  Consults: None  Significant Diagnostic Studies: none  Treatments: surgery: as above  Discharge Exam: Blood pressure (!) 180/72, pulse 61, temperature 98.8 F (37.1 C), temperature source Oral, resp. rate 12, height 5\' 9"  (1.753 m), weight 77.1 kg (170 lb), SpO2 98 %. General appearance: alert and cooperative Resp: clear to auscultation bilaterally Cardio: regular rate and rhythm GI: soft, mild tenderness. drain output nonbilious  Disposition:   Discharge Instructions    Call MD for:  difficulty breathing, headache or visual disturbances   Complete by:  As directed    Call MD for:  extreme fatigue   Complete by:  As directed    Call MD for:  hives   Complete by:  As directed    Call MD for:  persistant dizziness or light-headedness   Complete by:  As directed    Call MD for:  persistant nausea and vomiting   Complete by:  As directed    Call MD for:  redness, tenderness, or signs of infection (pain, swelling, redness, odor or green/yellow discharge around incision site)   Complete by:  As directed    Call MD for:  severe uncontrolled pain   Complete by:  As directed    Call MD for:  temperature >100.4   Complete by:  As directed    Diet - low sodium heart healthy   Complete by:  As directed    Discharge instructions   Complete by:  As directed    May shower if you cover drain site. No heavy lifting. Diet as  tolerated   Increase activity slowly   Complete by:  As directed    No wound care   Complete by:  As directed      Allergies as of 05/25/2018   No Known Allergies     Medication List    TAKE these medications   aspirin 81 MG tablet Take 81 mg by mouth daily.   brimonidine 0.2 % ophthalmic solution Commonly known as:  ALPHAGAN Place 1 drop into both eyes 2 (two) times daily.   glimepiride 4 MG tablet Commonly known as:  AMARYL Take 1 tablet (4 mg total) by mouth daily with breakfast. What changed:  when to take this   HYDROcodone-acetaminophen 5-325 MG tablet Commonly known as:  NORCO/VICODIN Take 1-2 tablets by mouth every 6 (six) hours as needed for moderate pain. What changed:    how much to take  when to take this   losartan 100 MG tablet Commonly known as:  COZAAR Take 100 mg by mouth daily.   metoprolol succinate 50 MG 24 hr tablet Commonly known as:  TOPROL-XL Take 1 tablet (50 mg total) by mouth daily. Take with or immediately following a meal.   simvastatin 40 MG tablet Commonly known as:  ZOCOR Take 40 mg by mouth daily.   timolol 0.5 % ophthalmic solution Commonly known as:  TIMOPTIC Place 1 drop into both eyes 2 (two) times daily.      Follow-up Information  Chevis Prettyoth, Tracen Mahler III, MD Follow up in 2 week(s).   Specialty:  General Surgery Contact information: [address] [place] [address] [phone]        [facility] - Dme Follow up.   Contact information: [address] [place] [address] [phone]           Signed: Robyne AskewOTH III,Tully Burgo S 05/28/2018, 10:18 AM

## 2018-06-04 ENCOUNTER — Other Ambulatory Visit: Payer: Self-pay

## 2018-06-04 ENCOUNTER — Emergency Department (HOSPITAL_COMMUNITY): Payer: Medicare Other

## 2018-06-04 ENCOUNTER — Encounter (HOSPITAL_COMMUNITY): Payer: Self-pay | Admitting: *Deleted

## 2018-06-04 ENCOUNTER — Inpatient Hospital Stay (HOSPITAL_COMMUNITY)
Admission: EM | Admit: 2018-06-04 | Discharge: 2018-06-06 | DRG: 371 | Disposition: A | Discharge status: Home Health | Payer: Medicare Other | Attending: Family Medicine | Admitting: Family Medicine

## 2018-06-04 DIAGNOSIS — D649 Anemia, unspecified: Secondary | ICD-10-CM | POA: Diagnosis present

## 2018-06-04 DIAGNOSIS — E7800 Pure hypercholesterolemia, unspecified: Secondary | ICD-10-CM | POA: Diagnosis not present

## 2018-06-04 DIAGNOSIS — K529 Noninfective gastroenteritis and colitis, unspecified: Secondary | ICD-10-CM | POA: Diagnosis not present

## 2018-06-04 DIAGNOSIS — N183 Chronic kidney disease, stage 3 (moderate): Secondary | ICD-10-CM | POA: Diagnosis not present

## 2018-06-04 DIAGNOSIS — R011 Cardiac murmur, unspecified: Secondary | ICD-10-CM | POA: Diagnosis present

## 2018-06-04 DIAGNOSIS — R269 Unspecified abnormalities of gait and mobility: Secondary | ICD-10-CM | POA: Diagnosis present

## 2018-06-04 DIAGNOSIS — Z8619 Personal history of other infectious and parasitic diseases: Secondary | ICD-10-CM

## 2018-06-04 DIAGNOSIS — M19042 Primary osteoarthritis, left hand: Secondary | ICD-10-CM | POA: Diagnosis present

## 2018-06-04 DIAGNOSIS — E876 Hypokalemia: Secondary | ICD-10-CM

## 2018-06-04 DIAGNOSIS — Z8701 Personal history of pneumonia (recurrent): Secondary | ICD-10-CM

## 2018-06-04 DIAGNOSIS — M109 Gout, unspecified: Secondary | ICD-10-CM | POA: Diagnosis not present

## 2018-06-04 DIAGNOSIS — E785 Hyperlipidemia, unspecified: Secondary | ICD-10-CM | POA: Diagnosis present

## 2018-06-04 DIAGNOSIS — M19041 Primary osteoarthritis, right hand: Secondary | ICD-10-CM | POA: Diagnosis present

## 2018-06-04 DIAGNOSIS — Z79899 Other long term (current) drug therapy: Secondary | ICD-10-CM

## 2018-06-04 DIAGNOSIS — K5792 Diverticulitis of intestine, part unspecified, without perforation or abscess without bleeding: Secondary | ICD-10-CM | POA: Diagnosis present

## 2018-06-04 DIAGNOSIS — R262 Difficulty in walking, not elsewhere classified: Secondary | ICD-10-CM | POA: Diagnosis not present

## 2018-06-04 DIAGNOSIS — E1151 Type 2 diabetes mellitus with diabetic peripheral angiopathy without gangrene: Secondary | ICD-10-CM | POA: Diagnosis present

## 2018-06-04 DIAGNOSIS — A0471 Enterocolitis due to Clostridium difficile, recurrent: Secondary | ICD-10-CM | POA: Diagnosis not present

## 2018-06-04 DIAGNOSIS — N179 Acute kidney failure, unspecified: Secondary | ICD-10-CM | POA: Diagnosis not present

## 2018-06-04 DIAGNOSIS — M170 Bilateral primary osteoarthritis of knee: Secondary | ICD-10-CM | POA: Diagnosis present

## 2018-06-04 DIAGNOSIS — I130 Hypertensive heart and chronic kidney disease with heart failure and stage 1 through stage 4 chronic kidney disease, or unspecified chronic kidney disease: Secondary | ICD-10-CM | POA: Diagnosis not present

## 2018-06-04 DIAGNOSIS — Z9841 Cataract extraction status, right eye: Secondary | ICD-10-CM

## 2018-06-04 DIAGNOSIS — M19012 Primary osteoarthritis, left shoulder: Secondary | ICD-10-CM | POA: Diagnosis present

## 2018-06-04 DIAGNOSIS — M19011 Primary osteoarthritis, right shoulder: Secondary | ICD-10-CM | POA: Diagnosis present

## 2018-06-04 DIAGNOSIS — K819 Cholecystitis, unspecified: Secondary | ICD-10-CM | POA: Diagnosis present

## 2018-06-04 DIAGNOSIS — E860 Dehydration: Secondary | ICD-10-CM | POA: Diagnosis not present

## 2018-06-04 DIAGNOSIS — E1122 Type 2 diabetes mellitus with diabetic chronic kidney disease: Secondary | ICD-10-CM | POA: Diagnosis present

## 2018-06-04 DIAGNOSIS — Z9049 Acquired absence of other specified parts of digestive tract: Secondary | ICD-10-CM

## 2018-06-04 DIAGNOSIS — Z87891 Personal history of nicotine dependence: Secondary | ICD-10-CM

## 2018-06-04 DIAGNOSIS — Z9842 Cataract extraction status, left eye: Secondary | ICD-10-CM

## 2018-06-04 DIAGNOSIS — G4730 Sleep apnea, unspecified: Secondary | ICD-10-CM | POA: Diagnosis not present

## 2018-06-04 DIAGNOSIS — Z7984 Long term (current) use of oral hypoglycemic drugs: Secondary | ICD-10-CM

## 2018-06-04 DIAGNOSIS — I5032 Chronic diastolic (congestive) heart failure: Secondary | ICD-10-CM | POA: Diagnosis present

## 2018-06-04 DIAGNOSIS — Z6825 Body mass index (BMI) 25.0-25.9, adult: Secondary | ICD-10-CM

## 2018-06-04 DIAGNOSIS — Z7982 Long term (current) use of aspirin: Secondary | ICD-10-CM

## 2018-06-04 DIAGNOSIS — E43 Unspecified severe protein-calorie malnutrition: Secondary | ICD-10-CM | POA: Diagnosis present

## 2018-06-04 DIAGNOSIS — Z961 Presence of intraocular lens: Secondary | ICD-10-CM | POA: Diagnosis present

## 2018-06-04 DIAGNOSIS — Z8249 Family history of ischemic heart disease and other diseases of the circulatory system: Secondary | ICD-10-CM

## 2018-06-04 DIAGNOSIS — Z833 Family history of diabetes mellitus: Secondary | ICD-10-CM

## 2018-06-04 DIAGNOSIS — Z79891 Long term (current) use of opiate analgesic: Secondary | ICD-10-CM

## 2018-06-04 LAB — CBC WITH DIFFERENTIAL/PLATELET
Basophils Absolute: 0 10*3/uL (ref 0.0–0.1)
Basophils Relative: 0 %
Eosinophils Absolute: 0.7 10*3/uL (ref 0.0–0.7)
Eosinophils Relative: 4 %
HCT: 31.4 % — ABNORMAL LOW (ref 39.0–52.0)
Hemoglobin: 10.2 g/dL — ABNORMAL LOW (ref 13.0–17.0)
Lymphocytes Relative: 18 %
Lymphs Abs: 3.5 10*3/uL (ref 0.7–4.0)
MCH: 26.2 pg (ref 26.0–34.0)
MCHC: 32.5 g/dL (ref 30.0–36.0)
MCV: 80.7 fL (ref 78.0–100.0)
Monocytes Absolute: 1.7 10*3/uL — ABNORMAL HIGH (ref 0.1–1.0)
Monocytes Relative: 9 %
Neutro Abs: 13.8 10*3/uL — ABNORMAL HIGH (ref 1.7–7.7)
Neutrophils Relative %: 69 %
Platelets: 440 10*3/uL — ABNORMAL HIGH (ref 150–400)
RBC: 3.89 MIL/uL — ABNORMAL LOW (ref 4.22–5.81)
RDW: 16.7 % — ABNORMAL HIGH (ref 11.5–15.5)
WBC: 19.6 10*3/uL — ABNORMAL HIGH (ref 4.0–10.5)

## 2018-06-04 LAB — COMPREHENSIVE METABOLIC PANEL
ALT: 8 U/L (ref 0–44)
AST: 15 U/L (ref 15–41)
Albumin: 2.5 g/dL — ABNORMAL LOW (ref 3.5–5.0)
Alkaline Phosphatase: 96 U/L (ref 38–126)
Anion gap: 10 (ref 5–15)
BUN: 8 mg/dL (ref 8–23)
CO2: 31 mmol/L (ref 22–32)
Calcium: 8.5 mg/dL — ABNORMAL LOW (ref 8.9–10.3)
Chloride: 100 mmol/L (ref 98–111)
Creatinine, Ser: 1.68 mg/dL — ABNORMAL HIGH (ref 0.61–1.24)
GFR calc Af Amer: 45 mL/min — ABNORMAL LOW (ref >60)
GFR calc non Af Amer: 38 mL/min — ABNORMAL LOW (ref >60)
Glucose, Bld: 97 mg/dL (ref 70–99)
Potassium: 2.6 mmol/L — CL (ref 3.5–5.1)
Sodium: 141 mmol/L (ref 135–145)
Total Bilirubin: 0.7 mg/dL (ref 0.3–1.2)
Total Protein: 7.4 g/dL (ref 6.5–8.1)

## 2018-06-04 LAB — C DIFFICILE QUICK SCREEN W PCR REFLEX
C Diff antigen: POSITIVE — AB
C Diff interpretation: DETECTED
C Diff toxin: POSITIVE — AB

## 2018-06-04 LAB — URINALYSIS, ROUTINE W REFLEX MICROSCOPIC
Bacteria, UA: NONE SEEN
Bilirubin Urine: NEGATIVE
Glucose, UA: NEGATIVE mg/dL
Hgb urine dipstick: NEGATIVE
Ketones, ur: NEGATIVE mg/dL
Leukocytes, UA: NEGATIVE
Nitrite: NEGATIVE
Protein, ur: 100 mg/dL — AB
Specific Gravity, Urine: 1.008 (ref 1.005–1.030)
pH: 6 (ref 5.0–8.0)

## 2018-06-04 LAB — GLUCOSE, CAPILLARY
Glucose-Capillary: 50 mg/dL — ABNORMAL LOW (ref 70–99)
Glucose-Capillary: 163 mg/dL — ABNORMAL HIGH (ref 70–99)

## 2018-06-04 LAB — LACTIC ACID, PLASMA
Lactic Acid, Venous: 1.1 mmol/L (ref 0.5–1.9)
Lactic Acid, Venous: 0.6 mmol/L (ref 0.5–1.9)

## 2018-06-04 LAB — MAGNESIUM: Magnesium: 1.8 mg/dL (ref 1.7–2.4)

## 2018-06-04 LAB — LIPASE, BLOOD: Lipase: 28 U/L (ref 11–51)

## 2018-06-04 MED ORDER — SODIUM CHLORIDE 0.9 % IV BOLUS
500.0000 mL | Freq: Once | INTRAVENOUS | Status: AC
  Administered 2018-06-04: 500 mL via INTRAVENOUS

## 2018-06-04 MED ORDER — SIMVASTATIN 40 MG PO TABS
40.0000 mg | ORAL_TABLET | Freq: Every day | ORAL | Status: DC
  Administered 2018-06-05 – 2018-06-06 (×2): 40 mg via ORAL
  Filled 2018-06-04 (×2): qty 1

## 2018-06-04 MED ORDER — VANCOMYCIN 50 MG/ML ORAL SOLUTION: 125.0000 mg | Freq: Two times a day (BID) | ORAL | Status: DC

## 2018-06-04 MED ORDER — VANCOMYCIN 50 MG/ML ORAL SOLUTION: 125.0000 mg | ORAL | Status: DC

## 2018-06-04 MED ORDER — MAGNESIUM SULFATE 2 GM/50ML IV SOLN
2.0000 g | Freq: Once | INTRAVENOUS | Status: AC
  Administered 2018-06-04: 2 g via INTRAVENOUS
  Filled 2018-06-04: qty 50

## 2018-06-04 MED ORDER — VANCOMYCIN 50 MG/ML ORAL SOLUTION
125.0000 mg | Freq: Four times a day (QID) | ORAL | Status: DC
  Filled 2018-06-04: qty 2.5

## 2018-06-04 MED ORDER — INSULIN ASPART 100 UNIT/ML ~~LOC~~ SOLN: 0.0000 [IU] | Freq: Three times a day (TID) | SUBCUTANEOUS | Status: DC

## 2018-06-04 MED ORDER — CIPROFLOXACIN HCL 500 MG PO TABS
500.0000 mg | ORAL_TABLET | Freq: Once | ORAL | Status: AC
  Administered 2018-06-04: 500 mg via ORAL
  Filled 2018-06-04: qty 1

## 2018-06-04 MED ORDER — LACTATED RINGERS IV SOLN
INTRAVENOUS | Status: DC
  Administered 2018-06-05 – 2018-06-06 (×3): via INTRAVENOUS

## 2018-06-04 MED ORDER — TIMOLOL MALEATE 0.5 % OP SOLN
1.0000 [drp] | Freq: Two times a day (BID) | OPHTHALMIC | Status: DC
  Administered 2018-06-04 – 2018-06-06 (×4): 1 [drp] via OPHTHALMIC
  Filled 2018-06-04: qty 5

## 2018-06-04 MED ORDER — DEXTROSE 50 % IV SOLN
INTRAVENOUS | Status: AC
  Administered 2018-06-04: 50 mL
  Filled 2018-06-04: qty 50

## 2018-06-04 MED ORDER — POTASSIUM CHLORIDE CRYS ER 20 MEQ PO TBCR: 40.0000 meq | EXTENDED_RELEASE_TABLET | Freq: Two times a day (BID) | ORAL | Status: DC

## 2018-06-04 MED ORDER — VANCOMYCIN 50 MG/ML ORAL SOLUTION
125.0000 mg | Freq: Four times a day (QID) | ORAL | Status: DC
  Administered 2018-06-04 – 2018-06-06 (×7): 125 mg via ORAL
  Filled 2018-06-04 (×9): qty 2.5

## 2018-06-04 MED ORDER — HYDROCODONE-ACETAMINOPHEN 5-325 MG PO TABS
1.0000 | ORAL_TABLET | Freq: Four times a day (QID) | ORAL | Status: DC | PRN
Start: 2018-06-04 — End: 2018-06-06

## 2018-06-04 MED ORDER — METOPROLOL SUCCINATE ER 50 MG PO TB24
50.0000 mg | ORAL_TABLET | Freq: Every day | ORAL | Status: DC
  Administered 2018-06-05 – 2018-06-06 (×2): 50 mg via ORAL
  Filled 2018-06-04 (×2): qty 1

## 2018-06-04 MED ORDER — POTASSIUM CHLORIDE 10 MEQ/100ML IV SOLN
10.0000 meq | INTRAVENOUS | Status: AC
Start: 2018-06-04 — End: 2018-06-05
  Administered 2018-06-04 – 2018-06-05 (×4): 10 meq via INTRAVENOUS
  Filled 2018-06-04: qty 100

## 2018-06-04 MED ORDER — ACETAMINOPHEN 325 MG PO TABS: 650.0000 mg | ORAL_TABLET | Freq: Four times a day (QID) | ORAL | Status: DC | PRN

## 2018-06-04 MED ORDER — METRONIDAZOLE 500 MG PO TABS
500.0000 mg | ORAL_TABLET | Freq: Once | ORAL | Status: AC
  Administered 2018-06-04: 500 mg via ORAL
  Filled 2018-06-04: qty 1

## 2018-06-04 MED ORDER — ONDANSETRON HCL 4 MG/2ML IJ SOLN
4.0000 mg | Freq: Four times a day (QID) | INTRAMUSCULAR | Status: DC | PRN
  Administered 2018-06-04: 4 mg via INTRAVENOUS
  Filled 2018-06-04: qty 2

## 2018-06-04 MED ORDER — ASPIRIN EC 81 MG PO TBEC
81.0000 mg | DELAYED_RELEASE_TABLET | Freq: Every day | ORAL | Status: DC
  Administered 2018-06-05 – 2018-06-06 (×2): 81 mg via ORAL
  Filled 2018-06-04 (×2): qty 1

## 2018-06-04 MED ORDER — VANCOMYCIN 50 MG/ML ORAL SOLUTION: 125.0000 mg | Freq: Every day | ORAL | Status: DC

## 2018-06-04 MED ORDER — POTASSIUM CHLORIDE CRYS ER 20 MEQ PO TBCR
40.0000 meq | EXTENDED_RELEASE_TABLET | Freq: Once | ORAL | Status: AC
  Administered 2018-06-04: 40 meq via ORAL
  Filled 2018-06-04: qty 2

## 2018-06-04 MED ORDER — ENSURE ENLIVE PO LIQD: 237.0000 mL | Freq: Two times a day (BID) | ORAL | Status: DC

## 2018-06-04 MED ORDER — LACTATED RINGERS IV BOLUS
1000.0000 mL | Freq: Once | INTRAVENOUS | Status: AC
  Administered 2018-06-04: 1000 mL via INTRAVENOUS

## 2018-06-04 NOTE — ED Notes (Signed)
Report given to Hannah,RN

## 2018-06-04 NOTE — H&P (Signed)
History and Physical  CHAZE HRUSKA ZOX:[id number] DOB: 03-17-1946 DOA: 06/04/2018  Referring physician: Dr Madilyn Hook  PCP: Rinaldo Cloud, MD  Outpatient Specialists: Cardiology, General surgery Patient coming from: Home  Chief Complaint: Diarrhea  HPI: Jeremiah [name] is a 72 y.o. 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 difficile, peripheral vascular disease, hypertension, type 2 diabetes, chronic normocytic anemia, chronic diastolic CHF, who presented to ED [facility] with complaints of diarrhea.  Reports symptoms started about 5 days ago associated with poor oral intake and intermittent chills.  Remarkably denies abdominal pain or cramping, nausea or vomiting.  No dizziness or chest pain.  Reports drain in place from gallbladder with purulence.  To note patient was recently discharged on July 2 after being admitted for acute cholecystitis.  ED Course: Upon presentation to the ED T-max 99.6.  Lab studies remarkable for leukocytosis with WBC 19.6 K.  Hypokalemia with potassium of 2.6 most likely from GI losses.  Elevated creatinine 1.68 with baseline 1.3. CT abdomen and pelvis without contrast revealed descending and rectosigmoid colon appearing newly inflamed with suspicion for diverticulitis versus colitis.  General surgery consulted by ED physician and following.  Admitted for acute diverticulitis.  Review of Systems: Review of systems as noted in the HPI.  All other systems reviewed and are negative.   Past Medical History:  Diagnosis Date  . Arthritis    "joints; shoulders, knees, hands, back" (05/21/2018)  . C. difficile diarrhea 04/2018  . Diastolic CHF (HCC)   . High cholesterol   . History of gout   . Hypertension   . Peripheral vascular disease (HCC)   . Pneumonia    "couple times" (05/21/2018)  . Sleep apnea    "has mask; won't use" (05/21/2018)  . Type II  diabetes mellitus (HCC)    Past Surgical History:  Procedure Laterality Date  . CATARACT EXTRACTION W/ INTRAOCULAR LENS  IMPLANT, BILATERAL Bilateral   . CHOLECYSTECTOMY  05/21/2018   ATTEMPTED LAPAROSCOPIC CHOLECYSTECTOMY, OPEN DRAINAGE OF GALLBLADDER WITH BIOPSY  . CHOLECYSTECTOMY N/A 05/21/2018   Procedure: ATTEMPTED LAPAROSCOPIC CHOLECYSTECTOMY, OPEN DRAINAGE OF GALLBLADDER WITH BIOPSY;  Surgeon: Griselda Miner, MD;  Location: [facility] OR;  Service: General;  Laterality: N/A;  . COLONOSCOPY W/ POLYPECTOMY    . IR CATHETER TUBE CHANGE  04/14/2018  . IR CHOLANGIOGRAM EXISTING TUBE  03/17/2018  . IR PERC CHOLECYSTOSTOMY  01/31/2018  . IR RADIOLOGIST EVAL & MGMT  03/02/2018    Social History:  reports that he quit smoking about 34 years ago. His smoking use included cigarettes. He quit after 24.00 years of use. He has never used smokeless tobacco. He reports that he drank alcohol. He reports that he does not use drugs.   No Known Allergies  Family History  Problem Relation Age of Onset  . Diabetes Mother   . Hypertension Mother   . Heart disease Father   . Heart attack Father   . Diabetes Sister   . Hypertension Sister   . Diabetes Brother   . Heart disease Brother   . Hypertension Brother   . Heart attack Brother       Prior to Admission medications   Medication Sig Start Date End Date Taking? Authorizing Provider  aspirin 81 MG tablet Take 81 mg by mouth daily.   Yes [provider]  brimonidine (ALPHAGAN) 0.2 % ophthalmic solution Place 1 drop into both eyes 2 (two) times daily.  Yes [provider]  glimepiride (AMARYL) 4 MG tablet Take 1 tablet (4 mg total) by mouth daily with breakfast. Patient taking differently: Take 4 mg by mouth 2 (two) times daily.  02/02/18  Yes Buriev, Isaiah SergeUlugbek N, MD  HYDROcodone-acetaminophen (NORCO/VICODIN) 5-325 MG tablet Take 1-2 tablets by mouth every 6 (six) hours as needed for moderate pain. 05/24/18  Yes Chevis Prettyoth, Paul III, MD  losartan  (COZAAR) 100 MG tablet Take 100 mg by mouth daily. 03/01/18  Yes [provider]  metoprolol succinate (TOPROL-XL) 50 MG 24 hr tablet Take 1 tablet (50 mg total) by mouth daily. Take with or immediately following a meal. 04/02/18  Yes Rinaldo CloudHarwani, Mohan, MD  simvastatin (ZOCOR) 40 MG tablet Take 40 mg by mouth daily.   Yes [provider]  timolol (TIMOPTIC) 0.5 % ophthalmic solution Place 1 drop into both eyes 2 (two) times daily. 01/27/18  Yes [provider]    Physical Exam: BP (!) 173/92   Pulse 73   Temp 98.3 F (36.8 C) (Oral)   Resp 15   SpO2 99%   . General: 72 y.o. year-old male well developed well nourished in no acute distress.  Alert and oriented x3. . Cardiovascular: Regular rate and rhythm with no rubs or gallops.  No thyromegaly or JVD noted.  Grade 3 out of 6 systolic murmur. Marland Kitchen. Respiratory: Diffuse mild rales bilaterally with no wheezes. Good inspiratory effort. . Abdomen: Soft nontender nondistended with hypoactive bowel sounds x4 quadrants.  Staples on right upper quadrant and mid lower abdomen.  Gallbladder drain in place with small amount purulent fluid in the collector. . Musculoskeletal: No lower extremity edema.  . Skin: As stated above . Psychiatry: Mood is appropriate for condition and setting          Labs on Admission:  Basic Metabolic Panel: Recent Labs  Lab 06/04/18 1221  NA 141  K 2.6*  CL 100  CO2 31  GLUCOSE 97  BUN 8  CREATININE 1.68*  CALCIUM 8.5*  MG 1.8   Liver Function Tests: Recent Labs  Lab 06/04/18 1221  AST 15  ALT 8  ALKPHOS 96  BILITOT 0.7  PROT 7.4  ALBUMIN 2.5*   Recent Labs  Lab 06/04/18 1221  LIPASE 28   No results for input(s): AMMONIA in the last 168 hours. CBC: Recent Labs  Lab 06/04/18 1221  WBC 19.6*  NEUTROABS 13.8*  HGB 10.2*  HCT 31.4*  MCV 80.7  PLT 440*   Cardiac Enzymes: No results for input(s): CKTOTAL, CKMB, CKMBINDEX, TROPONINI in the last 168 hours.  BNP (last 3  results) Recent Labs    03/27/18 1239 03/31/18 0519 03/31/18 1903  BNP 439.0* 526.8* 617.4*    ProBNP (last 3 results) No results for input(s): PROBNP in the last 8760 hours.  CBG: No results for input(s): GLUCAP in the last 168 hours.  Radiological Exams on Admission: Ct Abdomen Pelvis Wo Contrast  Result Date: 06/04/2018 CLINICAL DATA:  72 year old male status post attempted laparoscopic cholecystectomy on 05/21/2018 with subsequent open drainage of the gallbladder and biopsy. Gallbladder adhesions to the liver, colon, and duodenum at the time of surgery. Diarrhea and nausea for 5 days. EXAM: CT ABDOMEN AND PELVIS WITHOUT CONTRAST TECHNIQUE: Multidetector CT imaging of the abdomen and pelvis was performed following the standard protocol without IV contrast. COMPARISON:  Noncontrast CT Abdomen and Pelvis 04/13/2018 and earlier. FINDINGS: Lower chest: Moderate layering right pleural effusion persists. Stable small layering left pleural effusion. No pericardial  effusion. No lung base consolidation. Hepatobiliary: Sequelae of partial gallbladder resection with persistent right lateral approach surgical drain in place across the residual open gallbladder. There is trace perihepatic free fluid, mostly laterally. Gas within what appears to be the residual gallbladder is in continuity with a thick-walled small bowel loop in the right upper quadrant as seen on coronal image 27, consistent with mature versus developing fistula. Thick-walled large bowel is also noted throughout the region, although no definite bowel to bowel fistula is identified. The duodenum is inseparable from the hepatic flexure. There is a small volume of oral contrast in the stomach and the large bowel from the cecum to the splenic flexure. Negative noncontrast liver parenchyma. Pancreas: Negative. Spleen: Negative. Adrenals/Urinary Tract: Chronic renal vascular calcifications. Stable noncontrast appearance of both kidneys and  ureters. There is a small to moderate volume of gas within the urinary bladder. The bladder otherwise appears unremarkable. Stomach/Bowel: The rectum appears boggy and indistinct with surrounding mild mesenteric stranding, presacral stranding. The sigmoid colon is redundant. There is moderate to severe diverticulosis of the proximal sigmoid and descending colon with mild sigmoid mesenteric inflammatory stranding (series 2, image 65). The descending colon also appears thick walled. Diverticulosis continues to the splenic flexure. Oral contrast has reached the distal transverse colon which has a more normal appearance. The hepatic flexure and distal ascending colon are chronically abnormal with wall thickening and regional mesenteric stranding. This is inseparable from the open gallbladder drainage described above. The cecum and appendix remain within normal limits. See additional small bowel findings described above. Negative terminal ileum. No dilated small bowel. The proximal stomach is normal. The gastric antrum is inseparable from the duodenal thickening. There is no abdominal free air.  No organized fluid collection. Vascular/Lymphatic: Extensive Aortoiliac calcified atherosclerosis. Vascular patency is not evaluated in the absence of IV contrast. Reproductive: Negative. Other: Mild presacral stranding, but no pelvic free fluid. There are postoperative changes to the ventral abdominal wall including skin staples in the right upper quadrant with a small volume of abdominal wall intramuscular gas and fluid (series 2, image 18). Musculoskeletal: No acute osseous abnormality identified. IMPRESSION: 1. Sequelae of attempted cholecystectomy with open gallbladder and residual surgical drain in place. Underlying chronic regional right upper quadrant inflammation and adhesions. 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 No other fistula is evident, although the  hepatic flexure is inseparable from the duodenum. 2. Small volume right abdominal wall intramuscular air and fluid underlying the right upper quadrant incision is indeterminate for abscess versus seroma. 3. The descending and rectosigmoid colon appear newly inflamed since 04/13/2018, such as due to mild colitis versus diverticulitis. 4. Trace perihepatic free fluid with no organized intra-abdominal fluid collection. 5. Gas within the urinary bladder which is suspicious for UTI unless explained by recent catheterization. 6. Stable moderate right and small left layering pleural effusions. Electronically Signed   By: Odessa Fleming M.D.   On: 06/04/2018 15:48    EKG: Independently reviewed.  None available at the time of this dictation.  Assessment/Plan Present on Admission: . Acute diverticulitis  Active Problems:   Acute diverticulitis  Acute sigmoid diverticulitis versus colitis Start IV Zosyn empirically Clear liquid diet as tolerated General surgery consulted and following Monitor fever curve Monitor WBC Repeat CBC in the morning  Leukocytosis suspect secondary to acute diverticulitis WBC 19 K Repeat CBC in the morning Start IV Zosyn empirically Obtain blood cultures x2 peripherally Monitor fever curve  Severe hypokalemia suspect secondary to GI losses Potassium 2.6 Repleted with KCl p.o. 40 mEq x 3 Add IV magnesium 2 g once  AKI on CKD 3 Suspect prerenal from dehydration/diarrhea Creatinine 1.68 on admission Baseline creatinine 1.30 Avoid nephrotoxic agents/hypotension/dehydration Encourage p.o. fluid intake Repeat BMP in the morning  Hypomagnesemia Magnesium 1.8 IV magnesium 2 g once  Chronic normocytic anemia Hemoglobin 10.2 with MCV 80.7 Appears to be at baseline No sign of overt bleeding Repeat CBC in the morning  Right pleural effusion noted on CT abdomen and pelvis Personally reviewed CT abdomen and pelvis with no contrast done on 06/04/2018 which revealed a  moderate-sized pleural effusion Denies dyspnea O2 supplementation as needed May consider right thoracentesis if symptomatic  Severe protein calorie malnutrition Albumin 2.5 Oral supplement as tolerated Encourage increase oral p.o. protein calorie intake  Chronic diastolic CHF Strict I's and Os Daily weight Last 2D echo done on 03/28/2018 revealed LVEF 50 to 55% with grade 1 diastolic dysfunction Resume metoprolol succinate Hold losartan due to AKI  Type 2 diabetes Hold glimepiride Obtain A1c Start sensitive insulin sliding scale Avoid hypoglycemia Clear liquid diet as tolerated  Hyperlipidemia Continue simvastatin  Hypertension Resume home medications Hold losartan due to AKI  Recently treated C. difficile Enteric precaution  Ambulatory dysfunction Uses a cane at baseline PT to assess tomorrow 06/05/18 Fall precautions   Risks: Moderate to severe due to suspected acute diverticulitis, AKI on CKD 3, multiple comorbidities, and advanced age.   DVT prophylaxis: Subcu heparin 3 times daily  Code Status: Full code  Family Communication: Wife and brother at at bedside.  All questions answered to the satisfaction.  Disposition Plan: To home in 2 to 3 days  Consults called: General surgery consulted by ED physician  Admission status: Inpatient status  Patient will require at least 2 midnights for further testing and treatment of present condition.    Darlin Drop MD [facility] Pager [phone]  If 7PM-7AM, please contact night-coverage [email or web address] Password [facility]  06/04/2018, 5:05 PM

## 2018-06-04 NOTE — Progress Notes (Signed)
Pharmacy Antibiotic Note  Curley SpiceWalter L Mcvay is a 72 y.o. male admitted on 06/04/2018 with intra-abdominal infection.  Pharmacy has been consulted for piperacillin/tazobactam dosing. On 6/28, xanthogranulomatous cholecystitis status post attempted open cholecystectomy (unable to remove d/t adhesion) with drainage of the gallbladder.  Also recent C. Difficile infection.    Plan: Zosyn 3.375g IV q8h (4 hour infusion).  - Monitor renal function  C. Difficile pending, if positive stop broad spectrum antibiotics if no other source of infection    Temp (24hrs), Avg:98.3 F (36.8 C), Min:98.3 F (36.8 C), Max:98.3 F (36.8 C)  Recent Labs  Lab 06/04/18 1221  WBC 19.6*  CREATININE 1.68*    Estimated Creatinine Clearance: 38.6 mL/min (A) (by C-G formula based on SCr of 1.68 mg/dL (H)).    No Known Allergies  Antimicrobials this admission: 7/12 metronidazole/ciprofloxacin x 1 7/12 piperacillin/tazobactam >>  Dose adjustments this admission:  Microbiology results: 7/12 BCx:  7/12 C. Difficile:   Thank you for allowing pharmacy to be a part of this patient's care.  Juliette Alcideustin Sharanya Templin, PharmD, BCPS.   Pager: [phone] 06/04/2018 6:11 PM

## 2018-06-04 NOTE — Progress Notes (Signed)
[facility] Progress Note     Subjective: CC: diarrhea  Patient is s/p attempted laparoscopic converted to open cholecystectomy, gallbladder was unable to be removed due to dense adhesions and biopsies were taken due to concern for malignancy. Pathology report shows no malignancy but xanthogranulomatous cholecystitis.   Patient was doing well post-operatively and was supposed to see Dr. Carolynne Edouard in the office today but he came to the ER with abdominal pain and diarrhea. Reports 3-4 loose watery BMs per day and states it seems similar to when he had C. Diff colitis 1 month ago. He finished his abx for this but does not remember which abx he was on. Drain is in place and still draining small amounts purulent looking fluid. Denies chest pain, SOB, urinary sxs.   Objective: Vital signs in last 24 hours: Temp:  [98.3 F (36.8 C)] 98.3 F (36.8 C) (07/12 1220) Pulse Rate:  [64-77] 64 (07/12 1322) Resp:  [15-18] 15 (07/12 1322) BP: (136-168)/(60-70) 168/70 (07/12 1322) SpO2:  [97 %] 97 % (07/12 1322)    Intake/Output from previous day: No intake/output data recorded. Intake/Output this shift: No intake/output data recorded.  PE: Gen:  Alert, NAD, pleasant Card:  Regular rate and rhythm, pedal pulses 2+ BL Pulm:  Normal effort, clear to auscultation bilaterally Abd: Soft, mild LLQ TTP, non-distended, bowel sounds present in all 4 quadrants, no HSM, incisions C/D/I with staples present, drain in RUQ with think milky drainage present  Skin: warm and dry, no rashes  Psych: A&Ox3   Lab Results:  Recent Labs    06/04/18 1221  WBC 19.6*  HGB 10.2*  HCT 31.4*  PLT 440*   BMET Recent Labs    06/04/18 1221  NA 141  K 2.6*  CL 100  CO2 31  GLUCOSE 97  BUN 8  CREATININE 1.68*  CALCIUM 8.5*   PT/INR No results for input(s): LABPROT, INR in the last 72 hours. CMP     Component Value Date/Time   NA 141 06/04/2018 1221   K 2.6 (LL) 06/04/2018 1221   CL 100 06/04/2018  1221   CO2 31 06/04/2018 1221   GLUCOSE 97 06/04/2018 1221   BUN 8 06/04/2018 1221   CREATININE 1.68 (H) 06/04/2018 1221   CALCIUM 8.5 (L) 06/04/2018 1221   PROT 7.4 06/04/2018 1221   ALBUMIN 2.5 (L) 06/04/2018 1221   AST 15 06/04/2018 1221   ALT 8 06/04/2018 1221   ALKPHOS 96 06/04/2018 1221   BILITOT 0.7 06/04/2018 1221   GFRNONAA 38 (L) 06/04/2018 1221   GFRAA 45 (L) 06/04/2018 1221   Lipase     Component Value Date/Time   LIPASE 28 06/04/2018 1221       Studies/Results: Ct Abdomen Pelvis Wo Contrast  Result Date: 06/04/2018 CLINICAL DATA:  72 year old male status post attempted laparoscopic cholecystectomy on 05/21/2018 with subsequent open drainage of the gallbladder and biopsy. Gallbladder adhesions to the liver, colon, and duodenum at the time of surgery. Diarrhea and nausea for 5 days. EXAM: CT ABDOMEN AND PELVIS WITHOUT CONTRAST TECHNIQUE: Multidetector CT imaging of the abdomen and pelvis was performed following the standard protocol without IV contrast. COMPARISON:  Noncontrast CT Abdomen and Pelvis 04/13/2018 and earlier. FINDINGS: Lower chest: Moderate layering right pleural effusion persists. Stable small layering left pleural effusion. No pericardial effusion. No lung base consolidation. Hepatobiliary: Sequelae of partial gallbladder resection with persistent right lateral approach surgical drain in place across the residual open gallbladder. There is trace perihepatic free fluid, mostly  laterally. Gas within what appears to be the residual gallbladder is in continuity with a thick-walled small bowel loop in the right upper quadrant as seen on coronal image 27, consistent with mature versus developing fistula. Thick-walled large bowel is also noted throughout the region, although no definite bowel to bowel fistula is identified. The duodenum is inseparable from the hepatic flexure. There is a small volume of oral contrast in the stomach and the large bowel from the cecum to  the splenic flexure. Negative noncontrast liver parenchyma. Pancreas: Negative. Spleen: Negative. Adrenals/Urinary Tract: Chronic renal vascular calcifications. Stable noncontrast appearance of both kidneys and ureters. There is a small to moderate volume of gas within the urinary bladder. The bladder otherwise appears unremarkable. Stomach/Bowel: The rectum appears boggy and indistinct with surrounding mild mesenteric stranding, presacral stranding. The sigmoid colon is redundant. There is moderate to severe diverticulosis of the proximal sigmoid and descending colon with mild sigmoid mesenteric inflammatory stranding (series 2, image 65). The descending colon also appears thick walled. Diverticulosis continues to the splenic flexure. Oral contrast has reached the distal transverse colon which has a more normal appearance. The hepatic flexure and distal ascending colon are chronically abnormal with wall thickening and regional mesenteric stranding. This is inseparable from the open gallbladder drainage described above. The cecum and appendix remain within normal limits. See additional small bowel findings described above. Negative terminal ileum. No dilated small bowel. The proximal stomach is normal. The gastric antrum is inseparable from the duodenal thickening. There is no abdominal free air.  No organized fluid collection. Vascular/Lymphatic: Extensive Aortoiliac calcified atherosclerosis. Vascular patency is not evaluated in the absence of IV contrast. Reproductive: Negative. Other: Mild presacral stranding, but no pelvic free fluid. There are postoperative changes to the ventral abdominal wall including skin staples in the right upper quadrant with a small volume of abdominal wall intramuscular gas and fluid (series 2, image 18). Musculoskeletal: No acute osseous abnormality identified. IMPRESSION: 1. Sequelae of attempted cholecystectomy with open gallbladder and residual surgical drain in place. Underlying  chronic regional right upper quadrant inflammation and adhesions. 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 No other fistula is evident, although the hepatic flexure is inseparable from the duodenum. 2. Small volume right abdominal wall intramuscular air and fluid underlying the right upper quadrant incision is indeterminate for abscess versus seroma. 3. The descending and rectosigmoid colon appear newly inflamed since 04/13/2018, such as due to mild colitis versus diverticulitis. 4. Trace perihepatic free fluid with no organized intra-abdominal fluid collection. 5. Gas within the urinary bladder which is suspicious for UTI unless explained by recent catheterization. 6. Stable moderate right and small left layering pleural effusions. Electronically Signed   By: Odessa FlemingH  Hall M.D.   On: 06/04/2018 15:48    Anti-infectives: Anti-infectives (From admission, onward)   None       Assessment/Plan HTN HLD T2DM Peripheral vascular disease Diastolic CHF Hypokalemia - K 2.6  Leukocytosis - WBC 19.6, afebrile, likely from colitis S/P attempted laparoscopic cholecystectomy with open drainage of gallbladder with biopsy 05/21/18 Dr. Carolynne Edouardoth - CT 7/12: Sequelae of attempted cholecystectomy with open gallbladder and residual surgical drain in place. Underlying chronic regional right upper quadrant inflammation and adhesions. 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 Small volume right abdominal wall intramuscular air and fluid underlying the right upper quadrant incision is indeterminate for abscess versus seroma - surgical pathology significant for XANTHOGRANULOMATOUS CHOLECYSTITIS -  CT finding likely post-op seroma, patient non-tender in RUQ - would not drain at this time unless more concerned that this is source of infection  - remove staples  Rectosigmoid colitis vs diverticulitis - CT 7/12: The descending and  rectosigmoid colon appear newly inflamed since 04/13/2018, such as due to mild colitis versus diverticulitis - Hx of C.Diff colitis 1 month ago - I think this is very likely the etiology of colitis and abdominal pain, check C.Diff  - supportive care with IV hydration, IV abx, bowel rest and electrolyte replacement      LOS: 0 days    Wells Guiles , [facility] 06/04/2018, 4:05 PM Pager: [phone] Consults: [phone] Mon-Fri 7:00 am-4:30 pm Sat-Sun 7:00 am-11:30 am

## 2018-06-04 NOTE — ED Notes (Signed)
RN notified of abnormal lab 

## 2018-06-04 NOTE — ED Notes (Signed)
UNSUCCESSFUL ATTEMPT TO COLLECT LAB SAMPLE

## 2018-06-04 NOTE — ED Triage Notes (Signed)
Pt complains of diarrhea and nausea x 4-5 days. Pt's wife states the pt was treated for C. Diff last month. Pt denies abd pain, fever or emesis.

## 2018-06-04 NOTE — Progress Notes (Addendum)
CRITICAL VALUE ALERT  Critical Value:  50  Date & Time Notied:  7/12 2100  Provider Notified: yes  Orders Received/Actions taken: yes, repeat cbg 160

## 2018-06-04 NOTE — Progress Notes (Signed)
CRITICAL VALUE ALERT  Critical Value:  C. Diff positive   Date & Time Notied:  7/12 @ 1835  Provider Notified: Dr. Delma Post. Hall  Orders Received/Actions taken: ordered oral vancomycin

## 2018-06-04 NOTE — ED Notes (Signed)
ED TO INPATIENT HANDOFF REPORT  Name/Age/Gender Jeremiah [name] 72 y.o. male  Code Status    Code Status Orders  (From admission, onward)        Start     Ordered   06/04/18 1705  Full code  Continuous     06/04/18 1704    Code Status History    Date Active Date Inactive Code Status Order ID Comments User Context   05/21/2018 1400 05/25/2018 2118 Full Code [id number]  Jovita Kussmaul, MD Inpatient   04/13/2018 2318 04/17/2018 1455 Full Code [id number]  Bethena Roys, MD Inpatient   03/27/2018 1550 04/01/2018 1623 Full Code [id number]  Dixie Dials, MD ED   01/30/2018 2045 02/02/2018 1646 Full Code [id number]  Opyd, Ilene Qua, MD ED      Home/SNF/Other Home  Chief Complaint diarrhea  Level of Care/Admitting Diagnosis ED Disposition    ED Disposition Condition [facility] Area: [facility] [[id number]]  Level of Care: Med-Surg [16]  Diagnosis: Acute diverticulitis [1856314]  Admitting Physician: Kayleen Memos [[id number]]  Attending Physician: Kayleen Memos [[id number]]  Estimated length of stay: past midnight tomorrow  Certification:: I certify this patient will need inpatient services for at least 2 midnights  PT Class (Do Not Modify): Inpatient [101]  PT Acc Code (Do Not Modify): Private [1]       Medical History Past Medical History:  Diagnosis Date  . Arthritis    "joints; shoulders, knees, hands, back" (05/21/2018)  . C. difficile diarrhea 04/2018  . Diastolic CHF ([place])   . High cholesterol   . History of gout   . Hypertension   . Peripheral vascular disease ([place])   . Pneumonia    "couple times" (05/21/2018)  . Sleep apnea    "has mask; won't use" (05/21/2018)  . Type II diabetes mellitus (HCC)     Allergies No Known Allergies  IV Location/Drains/Wounds Patient Lines/Drains/Airways Status   Active Line/Drains/Airways    Name:   Placement date:   Placement time:   Site:   Days:   Peripheral IV 06/04/18 Left Antecubital    06/04/18    1246    Antecubital   less than 1   Closed System Drain Lateral RUQ Other (Comment)   04/14/18    -    RUQ   51   Closed System Drain 1 Right Abdomen Bulb (JP) 19 Fr.   05/21/18    -    Abdomen   14   Incision (Closed) 05/21/18 Abdomen   05/21/18    1207     14          Labs/Imaging Results for orders placed or performed during the hospital encounter of 06/04/18 (from the past 48 hour(s))  Comprehensive metabolic panel     Status: Abnormal   Collection Time: 06/04/18 12:21 PM  Result Value Ref Range   Sodium 141 135 - 145 mmol/L   Potassium 2.6 (LL) 3.5 - 5.1 mmol/L    Comment: CRITICAL RESULT CALLED TO, READ BACK BY AND VERIFIED WITH: WEST,S. RN AT 1342 06/04/18 MULLINS,T    Chloride 100 98 - 111 mmol/L    Comment: Please note change in reference range.   CO2 31 22 - 32 mmol/L   Glucose, Bld 97 70 - 99 mg/dL    Comment: Please note change in reference range.   BUN 8 8 - 23 mg/dL    Comment: Please note change in reference range.  Creatinine, Ser 1.68 (H) 0.61 - 1.24 mg/dL   Calcium 8.5 (L) 8.9 - 10.3 mg/dL   Total Protein 7.4 6.5 - 8.1 g/dL   Albumin 2.5 (L) 3.5 - 5.0 g/dL   AST 15 15 - 41 U/L   ALT 8 0 - 44 U/L    Comment: Please note change in reference range.   Alkaline Phosphatase 96 38 - 126 U/L   Total Bilirubin 0.7 0.3 - 1.2 mg/dL   GFR calc non Af Amer 38 (L) >60 mL/min   GFR calc Af Amer 45 (L) >60 mL/min    Comment: (NOTE) The eGFR has been calculated using the CKD EPI equation. This calculation has not been validated in all clinical situations. eGFR's persistently <60 mL/min signify possible Chronic Kidney Disease.    Anion gap 10 5 - 15    Comment: Performed at [facility], [place] [address] [place], [place] [address]  CBC with Differential     Status: Abnormal   Collection Time: 06/04/18 12:21 PM  Result Value Ref Range   WBC 19.6 (H) 4.0 - 10.5 K/uL   RBC 3.89 (L) 4.22 - 5.81 MIL/uL   Hemoglobin 10.2 (L) 13.0 - 17.0 g/dL    HCT 31.4 (L) 39.0 - 52.0 %   MCV 80.7 78.0 - 100.0 fL   MCH 26.2 26.0 - 34.0 pg   MCHC 32.5 30.0 - 36.0 g/dL   RDW 16.7 (H) 11.5 - 15.5 %   Platelets 440 (H) 150 - 400 K/uL   Neutrophils Relative % 69 %   Neutro Abs 13.8 (H) 1.7 - 7.7 K/uL   Lymphocytes Relative 18 %   Lymphs Abs 3.5 0.7 - 4.0 K/uL   Monocytes Relative 9 %   Monocytes Absolute 1.7 (H) 0.1 - 1.0 K/uL   Eosinophils Relative 4 %   Eosinophils Absolute 0.7 0.0 - 0.7 K/uL   Basophils Relative 0 %   Basophils Absolute 0.0 0.0 - 0.1 K/uL    Comment: Performed at [facility], [place] [address], [place], [place] [address]  Lipase, blood     Status: None   Collection Time: 06/04/18 12:21 PM  Result Value Ref Range   Lipase 28 11 - 51 U/L    Comment: Performed at [facility], [place] [address] [place], [place] [address]  Magnesium     Status: None   Collection Time: 06/04/18 12:21 PM  Result Value Ref Range   Magnesium 1.8 1.7 - 2.4 mg/dL    Comment: Performed at [facility], [place] [address] [place], [place] [address]  Urinalysis, Routine w reflex microscopic     Status: Abnormal   Collection Time: 06/04/18  2:12 PM  Result Value Ref Range   Color, Urine YELLOW YELLOW   APPearance CLEAR CLEAR   Specific Gravity, Urine 1.008 1.005 - 1.030   pH 6.0 5.0 - 8.0   Glucose, UA NEGATIVE NEGATIVE mg/dL   Hgb urine dipstick NEGATIVE NEGATIVE   Bilirubin Urine NEGATIVE NEGATIVE   Ketones, ur NEGATIVE NEGATIVE mg/dL   Protein, ur 100 (A) NEGATIVE mg/dL   Nitrite NEGATIVE NEGATIVE   Leukocytes, UA NEGATIVE NEGATIVE   RBC / HPF 0-5 0 - 5 RBC/hpf   WBC, UA 0-5 0 - 5 WBC/hpf   Bacteria, UA NONE SEEN NONE SEEN   Hyaline Casts, UA PRESENT     Comment: Performed at [facility], [place] [address], [place], [place] [address]   Ct Abdomen  Pelvis Wo Contrast  Result Date: 06/04/2018 CLINICAL DATA:  72 year old male status post attempted laparoscopic  cholecystectomy on 05/21/2018 with subsequent open drainage of the gallbladder and biopsy. Gallbladder adhesions to the liver, colon, and duodenum at the time of surgery. Diarrhea and nausea for 5 days. EXAM: CT ABDOMEN AND PELVIS WITHOUT CONTRAST TECHNIQUE: Multidetector CT imaging of the abdomen and pelvis was performed following the standard protocol without IV contrast. COMPARISON:  Noncontrast CT Abdomen and Pelvis 04/13/2018 and earlier. FINDINGS: Lower chest: Moderate layering right pleural effusion persists. Stable small layering left pleural effusion. No pericardial effusion. No lung base consolidation. Hepatobiliary: Sequelae of partial gallbladder resection with persistent right lateral approach surgical drain in place across the residual open gallbladder. There is trace perihepatic free fluid, mostly laterally. Gas within what appears to be the residual gallbladder is in continuity with a thick-walled small bowel loop in the right upper quadrant as seen on coronal image 27, consistent with mature versus developing fistula. Thick-walled large bowel is also noted throughout the region, although no definite bowel to bowel fistula is identified. The duodenum is inseparable from the hepatic flexure. There is a small volume of oral contrast in the stomach and the large bowel from the cecum to the splenic flexure. Negative noncontrast liver parenchyma. Pancreas: Negative. Spleen: Negative. Adrenals/Urinary Tract: Chronic renal vascular calcifications. Stable noncontrast appearance of both kidneys and ureters. There is a small to moderate volume of gas within the urinary bladder. The bladder otherwise appears unremarkable. Stomach/Bowel: The rectum appears boggy and indistinct with surrounding mild mesenteric stranding, presacral stranding. The sigmoid colon is redundant. There is moderate to severe diverticulosis of the proximal sigmoid and descending colon with mild sigmoid mesenteric inflammatory stranding  (series 2, image 65). The descending colon also appears thick walled. Diverticulosis continues to the splenic flexure. Oral contrast has reached the distal transverse colon which has a more normal appearance. The hepatic flexure and distal ascending colon are chronically abnormal with wall thickening and regional mesenteric stranding. This is inseparable from the open gallbladder drainage described above. The cecum and appendix remain within normal limits. See additional small bowel findings described above. Negative terminal ileum. No dilated small bowel. The proximal stomach is normal. The gastric antrum is inseparable from the duodenal thickening. There is no abdominal free air.  No organized fluid collection. Vascular/Lymphatic: Extensive Aortoiliac calcified atherosclerosis. Vascular patency is not evaluated in the absence of IV contrast. Reproductive: Negative. Other: Mild presacral stranding, but no pelvic free fluid. There are postoperative changes to the ventral abdominal wall including skin staples in the right upper quadrant with a small volume of abdominal wall intramuscular gas and fluid (series 2, image 18). Musculoskeletal: No acute osseous abnormality identified. IMPRESSION: 1. Sequelae of attempted cholecystectomy with open gallbladder and residual surgical drain in place. Underlying chronic regional right upper quadrant inflammation and adhesions. 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 No other fistula is evident, although the hepatic flexure is inseparable from the duodenum. 2. Small volume right abdominal wall intramuscular air and fluid underlying the right upper quadrant incision is indeterminate for abscess versus seroma. 3. The descending and rectosigmoid colon appear newly inflamed since 04/13/2018, such as due to mild colitis versus diverticulitis. 4. Trace perihepatic free fluid with no organized intra-abdominal fluid collection.  5. Gas within the urinary bladder which is suspicious for UTI unless explained by recent catheterization. 6. Stable moderate right and small left layering pleural effusions. Electronically Signed  By: Genevie Ann M.D.   On: 06/04/2018 15:48    Pending Labs Unresulted Labs (From admission, onward)   Start     Ordered   06/04/18 1638  Lactic acid, plasma  STAT Now then every 3 hours,   R     06/04/18 1637   06/04/18 1638  Culture, blood (routine x 2)  BLOOD CULTURE X 2,   R     06/04/18 1637   06/04/18 1255  C difficile quick scan w PCR reflex  (C Difficile quick screen w PCR reflex panel)  Once, for 24 hours,   R     06/04/18 1254      Vitals/Pain Today's Vitals   06/04/18 1220 06/04/18 1221 06/04/18 1322 06/04/18 1620  BP: 136/60  (!) 168/70 (!) 173/92  Pulse: 77  64 73  Resp: '18  15 15  '$ Temp: 98.3 F (36.8 C)     TempSrc: Oral     SpO2: 97%  97% 99%  PainSc: 0-No pain 0-No pain      Isolation Precautions Enteric precautions (UV disinfection)  Medications Medications  sodium chloride 0.9 % bolus 500 mL (0 mLs Intravenous Stopped 06/04/18 1408)  lactated ringers bolus 1,000 mL (0 mLs Intravenous Stopped 06/04/18 1507)  potassium chloride SA (K-DUR,KLOR-CON) CR tablet 40 mEq (40 mEq Oral Given 06/04/18 1406)  ciprofloxacin (CIPRO) tablet 500 mg (500 mg Oral Given 06/04/18 1617)  metroNIDAZOLE (FLAGYL) tablet 500 mg (500 mg Oral Given 06/04/18 1617)    Mobility walks with person assist x1

## 2018-06-04 NOTE — ED Provider Notes (Signed)
[facility]-EMERGENCY DEPT Provider Note   CSN: [id number] Arrival date & time: 06/04/18  1214     History   Chief Complaint Chief Complaint  Patient presents with  . Diarrhea    HPI Jeremiah [name] is a 72 y.o. male.  The history is provided by the patient and the spouse. No language interpreter was used.  Diarrhea     Jeremiah SpiceWalter L [name] is a 72 y.o. male who presents to the Emergency Department complaining of diarrhea. He presents to the emergency department complaining of diarrhea. He has a history of C diff, treated with oral antibiotics about a month ago. He was then admitted to the hospital for cholecystitis and had surgery but was unable to have his gallbladder removed at that time. He has been home since July 2. He was doing well until about 4 to 5 days ago when he developed food aversion with poor oral intake. He has associated abdominal discomfort with diarrhea. He reports 3 to 4 loose, watery bowel movements daily that are very foul-smelling. His wife states that it smells like his prior C. diff. No reports of fever, vomiting, dysuria. He is no longer on oral antibiotics. He was scheduled to follow-up with his surgeon today but came to the emergency department instead. He does have a drain in place and his wife states that she is draining fluid off every few days. She states it is only a scant amount each time she drains it and it is purulent appearing. Past Medical History:  Diagnosis Date  . Arthritis    "joints; shoulders, knees, hands, back" (05/21/2018)  . C. difficile diarrhea 04/2018  . Diastolic CHF (HCC)   . High cholesterol   . History of gout   . Hypertension   . Peripheral vascular disease (HCC)   . Pneumonia    "couple times" (05/21/2018)  . Sleep apnea    "has mask; won't use" (05/21/2018)  . Type II diabetes mellitus [facility](HCC)     Patient Active Problem List   Diagnosis Date Noted  . Cholecystitis 05/24/2018  . Cholecystitis with  cholelithiasis 05/21/2018  . Acute systolic heart failure (HCC) 03/27/2018  . Diabetes mellitus without complication (HCC) 01/30/2018  . Hypertension 01/30/2018  . Acute cholecystitis 01/30/2018  . AKI (acute kidney injury) (HCC) 01/30/2018  . Normocytic anemia 01/30/2018  . PVD (peripheral vascular disease) (HCC) 09/05/2014    Past Surgical History:  Procedure Laterality Date  . CATARACT EXTRACTION W/ INTRAOCULAR LENS  IMPLANT, BILATERAL Bilateral   . CHOLECYSTECTOMY  05/21/2018   ATTEMPTED LAPAROSCOPIC CHOLECYSTECTOMY, OPEN DRAINAGE OF GALLBLADDER WITH BIOPSY  . CHOLECYSTECTOMY N/A 05/21/2018   Procedure: ATTEMPTED LAPAROSCOPIC CHOLECYSTECTOMY, OPEN DRAINAGE OF GALLBLADDER WITH BIOPSY;  Surgeon: Griselda Mineroth, Paul III, MD;  Location: [facility] OR;  Service: General;  Laterality: N/A;  . COLONOSCOPY W/ POLYPECTOMY    . IR CATHETER TUBE CHANGE  04/14/2018  . IR CHOLANGIOGRAM EXISTING TUBE  03/17/2018  . IR PERC CHOLECYSTOSTOMY  01/31/2018  . IR RADIOLOGIST EVAL & MGMT  03/02/2018        Home Medications    Prior to Admission medications   Medication Sig Start Date End Date Taking? Authorizing Provider  aspirin 81 MG tablet Take 81 mg by mouth daily.   Yes [provider]  brimonidine (ALPHAGAN) 0.2 % ophthalmic solution Place 1 drop into both eyes 2 (two) times daily.    Yes [provider]  glimepiride (AMARYL) 4 MG tablet Take 1 tablet (4 mg total)  by mouth daily with breakfast. Patient taking differently: Take 4 mg by mouth 2 (two) times daily.  02/02/18  Yes Buriev, Isaiah Serge, MD  HYDROcodone-acetaminophen (NORCO/VICODIN) 5-325 MG tablet Take 1-2 tablets by mouth every 6 (six) hours as needed for moderate pain. 05/24/18  Yes Chevis Pretty III, MD  losartan (COZAAR) 100 MG tablet Take 100 mg by mouth daily. 03/01/18  Yes [provider]  metoprolol succinate (TOPROL-XL) 50 MG 24 hr tablet Take 1 tablet (50 mg total) by mouth daily. Take with or immediately following a meal.  04/02/18  Yes Rinaldo Cloud, MD  simvastatin (ZOCOR) 40 MG tablet Take 40 mg by mouth daily.   Yes [provider]  timolol (TIMOPTIC) 0.5 % ophthalmic solution Place 1 drop into both eyes 2 (two) times daily. 01/27/18  Yes [provider]    Family History Family History  Problem Relation Age of Onset  . Diabetes Mother   . Hypertension Mother   . Heart disease Father   . Heart attack Father   . Diabetes Sister   . Hypertension Sister   . Diabetes Brother   . Heart disease Brother   . Hypertension Brother   . Heart attack Brother     Social History Social History   Tobacco Use  . Smoking status: Former Smoker    Years: 24.00    Types: Cigarettes    Last attempt to quit: 11/25/1983    Years since quitting: 34.5  . Smokeless tobacco: Never Used  Substance Use Topics  . Alcohol use: Not Currently  . Drug use: No     Allergies   Patient has no known allergies.   Review of Systems Review of Systems  Gastrointestinal: Positive for diarrhea.  All other systems reviewed and are negative.    Physical Exam Updated Vital Signs BP (!) 168/70   Pulse 64   Temp 98.3 F (36.8 C) (Oral)   Resp 15   SpO2 97%   Physical Exam  Constitutional: He is oriented to person, place, and time. He appears well-developed and well-nourished.  HENT:  Head: Normocephalic and atraumatic.  Cardiovascular: Normal rate and regular rhythm.  No murmur heard. Pulmonary/Chest: Effort normal and breath sounds normal. No respiratory distress.  Abdominal: Soft. There is no rebound and no guarding.  Mild left lower quadrant tenderness without any guarding or rebound. Surgical wound in the right upper quadrant as well as the. Umbilical region clean, dry, intact with Staples in place. There is no local erythema or edema.  Musculoskeletal: He exhibits no edema or tenderness.  Neurological: He is alert and oriented to person, place, and time.  Skin: Skin is warm and dry.    Psychiatric: He has a normal mood and affect. His behavior is normal.  Nursing note and vitals reviewed.    ED Treatments / Results  Labs (all labs ordered are listed, but only abnormal results are displayed) Labs Reviewed  COMPREHENSIVE METABOLIC PANEL - Abnormal; Notable for the following components:      Result Value   Potassium 2.6 (*)    Creatinine, Ser 1.68 (*)    Calcium 8.5 (*)    Albumin 2.5 (*)    GFR calc non Af Amer 38 (*)    GFR calc Af Amer 45 (*)    All other components within normal limits  CBC WITH DIFFERENTIAL/PLATELET - Abnormal; Notable for the following components:   WBC 19.6 (*)    RBC 3.89 (*)    Hemoglobin 10.2 (*)  HCT 31.4 (*)    RDW 16.7 (*)    Platelets 440 (*)    Neutro Abs 13.8 (*)    Monocytes Absolute 1.7 (*)    All other components within normal limits  URINALYSIS, ROUTINE W REFLEX MICROSCOPIC - Abnormal; Notable for the following components:   Protein, ur 100 (*)    All other components within normal limits  C DIFFICILE QUICK SCREEN W PCR REFLEX  LIPASE, BLOOD  MAGNESIUM    EKG None  Radiology Ct Abdomen Pelvis Wo Contrast  Result Date: 06/04/2018 CLINICAL DATA:  72 year old male status post attempted laparoscopic cholecystectomy on 05/21/2018 with subsequent open drainage of the gallbladder and biopsy. Gallbladder adhesions to the liver, colon, and duodenum at the time of surgery. Diarrhea and nausea for 5 days. EXAM: CT ABDOMEN AND PELVIS WITHOUT CONTRAST TECHNIQUE: Multidetector CT imaging of the abdomen and pelvis was performed following the standard protocol without IV contrast. COMPARISON:  Noncontrast CT Abdomen and Pelvis 04/13/2018 and earlier. FINDINGS: Lower chest: Moderate layering right pleural effusion persists. Stable small layering left pleural effusion. No pericardial effusion. No lung base consolidation. Hepatobiliary: Sequelae of partial gallbladder resection with persistent right lateral approach surgical drain in  place across the residual open gallbladder. There is trace perihepatic free fluid, mostly laterally. Gas within what appears to be the residual gallbladder is in continuity with a thick-walled small bowel loop in the right upper quadrant as seen on coronal image 27, consistent with mature versus developing fistula. Thick-walled large bowel is also noted throughout the region, although no definite bowel to bowel fistula is identified. The duodenum is inseparable from the hepatic flexure. There is a small volume of oral contrast in the stomach and the large bowel from the cecum to the splenic flexure. Negative noncontrast liver parenchyma. Pancreas: Negative. Spleen: Negative. Adrenals/Urinary Tract: Chronic renal vascular calcifications. Stable noncontrast appearance of both kidneys and ureters. There is a small to moderate volume of gas within the urinary bladder. The bladder otherwise appears unremarkable. Stomach/Bowel: The rectum appears boggy and indistinct with surrounding mild mesenteric stranding, presacral stranding. The sigmoid colon is redundant. There is moderate to severe diverticulosis of the proximal sigmoid and descending colon with mild sigmoid mesenteric inflammatory stranding (series 2, image 65). The descending colon also appears thick walled. Diverticulosis continues to the splenic flexure. Oral contrast has reached the distal transverse colon which has a more normal appearance. The hepatic flexure and distal ascending colon are chronically abnormal with wall thickening and regional mesenteric stranding. This is inseparable from the open gallbladder drainage described above. The cecum and appendix remain within normal limits. See additional small bowel findings described above. Negative terminal ileum. No dilated small bowel. The proximal stomach is normal. The gastric antrum is inseparable from the duodenal thickening. There is no abdominal free air.  No organized fluid collection.  Vascular/Lymphatic: Extensive Aortoiliac calcified atherosclerosis. Vascular patency is not evaluated in the absence of IV contrast. Reproductive: Negative. Other: Mild presacral stranding, but no pelvic free fluid. There are postoperative changes to the ventral abdominal wall including skin staples in the right upper quadrant with a small volume of abdominal wall intramuscular gas and fluid (series 2, image 18). Musculoskeletal: No acute osseous abnormality identified. IMPRESSION: 1. Sequelae of attempted cholecystectomy with open gallbladder and residual surgical drain in place. Underlying chronic regional right upper quadrant inflammation and adhesions. 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 No other fistula is evident, although the  hepatic flexure is inseparable from the duodenum. 2. Small volume right abdominal wall intramuscular air and fluid underlying the right upper quadrant incision is indeterminate for abscess versus seroma. 3. The descending and rectosigmoid colon appear newly inflamed since 04/13/2018, such as due to mild colitis versus diverticulitis. 4. Trace perihepatic free fluid with no organized intra-abdominal fluid collection. 5. Gas within the urinary bladder which is suspicious for UTI unless explained by recent catheterization. 6. Stable moderate right and small left layering pleural effusions. Electronically Signed   By: Odessa Fleming M.D.   On: 06/04/2018 15:48    Procedures Procedures (including critical care time)  Medications Ordered in ED Medications  sodium chloride 0.9 % bolus 500 mL (0 mLs Intravenous Stopped 06/04/18 1408)  lactated ringers bolus 1,000 mL (0 mLs Intravenous Stopped 06/04/18 1507)  potassium chloride SA (K-DUR,KLOR-CON) CR tablet 40 mEq (40 mEq Oral Given 06/04/18 1406)  ciprofloxacin (CIPRO) tablet 500 mg (500 mg Oral Given 06/04/18 1617)  metroNIDAZOLE (FLAGYL) tablet 500 mg (500 mg Oral Given 06/04/18  1617)     Initial Impression / Assessment and Plan / ED Course  I have reviewed the triage vital signs and the nursing notes.  Pertinent labs & imaging results that were available during my care of the patient were reviewed by me and considered in my medical decision making (see chart for details).     Patient with history of C diff colitis, cholecystitis with active drain in place here for evaluation of left lower quadrant abdominal pain, diarrhea. He is non-toxic appearing on examination. Labs demonstrate significant hypokalemia,  leukocytosis. CT abdomen does demonstrate acute diverticulitis versus colitis. He was treated with IV fluid administration as well as potassium supplementation. Antibiotics started for colitis/diverticulitis. Discussed with general surgery regarding CT scan findings, appreciate their recommendations - will see the patient in consult. Medicine consulted for admission for IV fluid hydration and his electrolyte arrangements.  Final Clinical Impressions(s) / ED Diagnoses   Final diagnoses:  Colitis  Hypokalemia    ED Discharge Orders    None       Tilden Fossa, MD 06/04/18 1625

## 2018-06-05 DIAGNOSIS — E876 Hypokalemia: Secondary | ICD-10-CM | POA: Diagnosis not present

## 2018-06-05 DIAGNOSIS — I10 Essential (primary) hypertension: Secondary | ICD-10-CM | POA: Diagnosis not present

## 2018-06-05 DIAGNOSIS — K529 Noninfective gastroenteritis and colitis, unspecified: Secondary | ICD-10-CM | POA: Diagnosis not present

## 2018-06-05 DIAGNOSIS — N179 Acute kidney failure, unspecified: Secondary | ICD-10-CM | POA: Diagnosis not present

## 2018-06-05 DIAGNOSIS — A0472 Enterocolitis due to Clostridium difficile, not specified as recurrent: Secondary | ICD-10-CM | POA: Diagnosis not present

## 2018-06-05 DIAGNOSIS — A0471 Enterocolitis due to Clostridium difficile, recurrent: Secondary | ICD-10-CM | POA: Diagnosis not present

## 2018-06-05 LAB — COMPREHENSIVE METABOLIC PANEL
ALT: 8 U/L (ref 0–44)
AST: 10 U/L — ABNORMAL LOW (ref 15–41)
Albumin: 2.1 g/dL — ABNORMAL LOW (ref 3.5–5.0)
Alkaline Phosphatase: 79 U/L (ref 38–126)
Anion gap: 10 (ref 5–15)
BUN: 6 mg/dL — ABNORMAL LOW (ref 8–23)
CO2: 28 mmol/L (ref 22–32)
Calcium: 8.1 mg/dL — ABNORMAL LOW (ref 8.9–10.3)
Chloride: 104 mmol/L (ref 98–111)
Creatinine, Ser: 1.27 mg/dL — ABNORMAL HIGH (ref 0.61–1.24)
GFR calc Af Amer: >60 mL/min (ref >60)
GFR calc non Af Amer: 54 mL/min — ABNORMAL LOW (ref >60)
Glucose, Bld: 54 mg/dL — ABNORMAL LOW (ref 70–99)
Potassium: 2.8 mmol/L — ABNORMAL LOW (ref 3.5–5.1)
Sodium: 142 mmol/L (ref 135–145)
Total Bilirubin: 0.6 mg/dL (ref 0.3–1.2)
Total Protein: 6.2 g/dL — ABNORMAL LOW (ref 6.5–8.1)

## 2018-06-05 LAB — CBC
HCT: 27 % — ABNORMAL LOW (ref 39.0–52.0)
Hemoglobin: 8.7 g/dL — ABNORMAL LOW (ref 13.0–17.0)
MCH: 26.4 pg (ref 26.0–34.0)
MCHC: 32.2 g/dL (ref 30.0–36.0)
MCV: 81.8 fL (ref 78.0–100.0)
Platelets: 349 10*3/uL (ref 150–400)
RBC: 3.3 MIL/uL — ABNORMAL LOW (ref 4.22–5.81)
RDW: 16.8 % — ABNORMAL HIGH (ref 11.5–15.5)
WBC: 14.9 10*3/uL — ABNORMAL HIGH (ref 4.0–10.5)

## 2018-06-05 LAB — GLUCOSE, CAPILLARY
Glucose-Capillary: 87 mg/dL (ref 70–99)
Glucose-Capillary: 74 mg/dL (ref 70–99)
Glucose-Capillary: 104 mg/dL — ABNORMAL HIGH (ref 70–99)

## 2018-06-05 LAB — MAGNESIUM: Magnesium: 2 mg/dL (ref 1.7–2.4)

## 2018-06-05 MED ORDER — POTASSIUM CHLORIDE 10 MEQ/100ML IV SOLN
10.0000 meq | INTRAVENOUS | Status: AC
  Administered 2018-06-05 (×4): 10 meq via INTRAVENOUS
  Filled 2018-06-05 (×4): qty 100

## 2018-06-05 MED ORDER — HEPARIN SODIUM (PORCINE) 5000 UNIT/ML IJ SOLN
5000.0000 [IU] | Freq: Three times a day (TID) | INTRAMUSCULAR | Status: DC
  Administered 2018-06-05 – 2018-06-06 (×4): 5000 [IU] via SUBCUTANEOUS
  Filled 2018-06-05 (×5): qty 1

## 2018-06-05 NOTE — Progress Notes (Signed)
PT Cancellation Note  Patient Details Name: Jeremiah [name] MRN: [id number] DOB: 1946-01-09   Cancelled Treatment:    Reason Eval/Treat Not Completed: Other (comment).  Pt refused PT evaluation today, stating he has been up in chair and too tired to attempt mobility.  Will try again in the AM.   Ivar DrapeRuth E Charels Stambaugh 06/05/2018, 3:22 PM   Samul Dadauth Thaily Hackworth, PT MS Acute Rehab Dept. Number: [facility] [phone] and [facility] [phone]

## 2018-06-05 NOTE — Progress Notes (Signed)
Subjective/Chief Complaint: Diarrheaotherwise ok   Objective: Vital signs in last 24 hours: Temp:  [98.2 F (36.8 C)-98.9 F (37.2 C)] 98.2 F (36.8 C) (07/13 0535) Pulse Rate:  [61-96] 96 (07/13 0908) Resp:  [15-19] 18 (07/13 0535) BP: (136-173)/(60-92) 137/83 (07/13 0908) SpO2:  [96 %-99 %] 96 % (07/13 0908) Weight:  [75.9 kg (167 lb 5.3 oz)] 75.9 kg (167 lb 5.3 oz) (07/13 0535) Last BM Date: 06/04/18  Intake/Output from previous day: 07/12 0701 - 07/13 0700 In: 799.6 [P.O.:120; I.V.:679.6] Out: 20 [Drains:20] Intake/Output this shift: No intake/output data recorded.  GI: drain with no output this am, soft nontender incisions clean  Lab Results:  Recent Labs    06/04/18 1221 06/05/18 0514  WBC 19.6* 14.9*  HGB 10.2* 8.7*  HCT 31.4* 27.0*  PLT 440* 349   BMET Recent Labs    06/04/18 1221 06/05/18 0514  NA 141 142  K 2.6* 2.8*  CL 100 104  CO2 31 28  GLUCOSE 97 54*  BUN 8 6*  CREATININE 1.68* 1.27*  CALCIUM 8.5* 8.1*   PT/INR No results for input(s): LABPROT, INR in the last 72 hours. ABG No results for input(s): PHART, HCO3 in the last 72 hours.  Invalid input(s): PCO2, PO2  Studies/Results: Ct Abdomen Pelvis Wo Contrast  Result Date: 06/04/2018 CLINICAL DATA:  72 year old male status post attempted laparoscopic cholecystectomy on 05/21/2018 with subsequent open drainage of the gallbladder and biopsy. Gallbladder adhesions to the liver, colon, and duodenum at the time of surgery. Diarrhea and nausea for 5 days. EXAM: CT ABDOMEN AND PELVIS WITHOUT CONTRAST TECHNIQUE: Multidetector CT imaging of the abdomen and pelvis was performed following the standard protocol without IV contrast. COMPARISON:  Noncontrast CT Abdomen and Pelvis 04/13/2018 and earlier. FINDINGS: Lower chest: Moderate layering right pleural effusion persists. Stable small layering left pleural effusion. No pericardial effusion. No lung base consolidation. Hepatobiliary: Sequelae of  partial gallbladder resection with persistent right lateral approach surgical drain in place across the residual open gallbladder. There is trace perihepatic free fluid, mostly laterally. Gas within what appears to be the residual gallbladder is in continuity with a thick-walled small bowel loop in the right upper quadrant as seen on coronal image 27, consistent with mature versus developing fistula. Thick-walled large bowel is also noted throughout the region, although no definite bowel to bowel fistula is identified. The duodenum is inseparable from the hepatic flexure. There is a small volume of oral contrast in the stomach and the large bowel from the cecum to the splenic flexure. Negative noncontrast liver parenchyma. Pancreas: Negative. Spleen: Negative. Adrenals/Urinary Tract: Chronic renal vascular calcifications. Stable noncontrast appearance of both kidneys and ureters. There is a small to moderate volume of gas within the urinary bladder. The bladder otherwise appears unremarkable. Stomach/Bowel: The rectum appears boggy and indistinct with surrounding mild mesenteric stranding, presacral stranding. The sigmoid colon is redundant. There is moderate to severe diverticulosis of the proximal sigmoid and descending colon with mild sigmoid mesenteric inflammatory stranding (series 2, image 65). The descending colon also appears thick walled. Diverticulosis continues to the splenic flexure. Oral contrast has reached the distal transverse colon which has a more normal appearance. The hepatic flexure and distal ascending colon are chronically abnormal with wall thickening and regional mesenteric stranding. This is inseparable from the open gallbladder drainage described above. The cecum and appendix remain within normal limits. See additional small bowel findings described above. Negative terminal ileum. No dilated small bowel. The proximal stomach is normal. The  gastric antrum is inseparable from the duodenal  thickening. There is no abdominal free air.  No organized fluid collection. Vascular/Lymphatic: Extensive Aortoiliac calcified atherosclerosis. Vascular patency is not evaluated in the absence of IV contrast. Reproductive: Negative. Other: Mild presacral stranding, but no pelvic free fluid. There are postoperative changes to the ventral abdominal wall including skin staples in the right upper quadrant with a small volume of abdominal wall intramuscular gas and fluid (series 2, image 18). Musculoskeletal: No acute osseous abnormality identified. IMPRESSION: 1. Sequelae of attempted cholecystectomy with open gallbladder and residual surgical drain in place. Underlying chronic regional right upper quadrant inflammation and adhesions. 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 No other fistula is evident, although the hepatic flexure is inseparable from the duodenum. 2. Small volume right abdominal wall intramuscular air and fluid underlying the right upper quadrant incision is indeterminate for abscess versus seroma. 3. The descending and rectosigmoid colon appear newly inflamed since 04/13/2018, such as due to mild colitis versus diverticulitis. 4. Trace perihepatic free fluid with no organized intra-abdominal fluid collection. 5. Gas within the urinary bladder which is suspicious for UTI unless explained by recent catheterization. 6. Stable moderate right and small left layering pleural effusions. Electronically Signed   By: Odessa FlemingH  Hall M.D.   On: 06/04/2018 15:48    Anti-infectives: Anti-infectives (From admission, onward)   Start     Dose/Rate Route Frequency Ordered Stop   07/11/18 1000  vancomycin (VANCOCIN) 50 mg/mL oral solution 125 mg     125 mg Oral Every 3 DAYS 06/04/18 1854 07/20/18 0959   07/03/18 1000  vancomycin (VANCOCIN) 50 mg/mL oral solution 125 mg     125 mg Oral Every other day 06/04/18 1854 07/11/18 0959   06/26/18 1000  vancomycin  (VANCOCIN) 50 mg/mL oral solution 125 mg     125 mg Oral Daily 06/04/18 1854 07/03/18 0959   06/18/18 2200  vancomycin (VANCOCIN) 50 mg/mL oral solution 125 mg     125 mg Oral 2 times daily 06/04/18 1854 06/25/18 2159   06/04/18 2000  vancomycin (VANCOCIN) 50 mg/mL oral solution 125 mg  Status:  Discontinued     125 mg Oral 4 times daily 06/04/18 1854 06/04/18 1900   06/04/18 2000  vancomycin (VANCOCIN) 50 mg/mL oral solution 125 mg     125 mg Oral 4 times daily 06/04/18 1854 06/18/18 2159   06/04/18 1615  ciprofloxacin (CIPRO) tablet 500 mg     500 mg Oral  Once 06/04/18 1613 06/04/18 1617   06/04/18 1615  metroNIDAZOLE (FLAGYL) tablet 500 mg     500 mg Oral  Once 06/04/18 1613 06/04/18 1617      Assessment/Plan: S/p open gb drainage -staples out today -fu dr Carolynne Edouardtoth C diff per medicine Can have whatever diet they think ok  Emelia LoronMatthew Nyheem Binette 06/05/2018

## 2018-06-05 NOTE — Progress Notes (Signed)
CRITICAL VALUE ALERT  Critical Value:  Serum glucose 54  Date & Time Notied:  7/13 0700  Provider Notified: yes  Orders Received/Actions taken: pending

## 2018-06-05 NOTE — Progress Notes (Signed)
PROGRESS NOTE [facility]   EZRAH PANNING   ZOX:[id number] DOB: 08/19/1946  DOA: 06/04/2018 PCP: Rinaldo Cloud, MD   Brief Narrative:  Jeremiah [name] 72 year old male with medical history significant for PVD, hypertension, type 2 diabetes mellitus, chronic diastolic CHF, status post open cholecystectomy with JP drain of the gallbladder, recently treated for C. difficile.  Patient presented to the emergency department complaining of diarrhea that started 5 days prior to admission associated with poor oral intake and chills.  Upon ED evaluation T-max was 99.6, labs remarkable for leukocytosis at 19.6, hypokalemia 2.6 elevated creatinine at 1.6 CT abdomen and pelvis with suspicious of diverticulitis versus colitis.  C. difficile was positive for infectious process.  Patient was admitted with recurrent C. difficile colitis and started on oral vancomycin taper.  General surgery was consulted.  Subjective: Patient seen and examined, complaining of diarrhea.  No abdominal pain.  No bloody BMs, denies nausea vomiting.  Assessment & Plan: Acute colitis due to C. difficile Severe infection with leukocytosis and AKI. Recurrent C. difficile patient completed treatment about a month ago, agree with oral vancomycin taper. Advance diet as tolerated.  General surgery consulted and recommendations appreciated.  Follow-up CBC in a.m. blood cultures no growth so far  Severe hypokalemia Secondary to diarrhea Replete potassium IV Check BMP and magnesium in a.m.  AKI on CKD stage III Felt to be prerenal from dehydration/diarrhea Baseline creatinine 1.3 Avoid nephrotoxic agent and hypotension Encourage p.o. intake Monitor BMP in the morning  Status post open gallbladder drainage Continue management per surgical team  Type 2 diabetes mellitus Holding oral hypoglycemic agents, last A1c 8.9 on 03/2018 Continue SSI, monitor CBGs  Chronic diastolic CHF No signs of fluid overload, seems to be  euvolemic at this time Continue metoprolol Holding losartan due to AKI   DVT prophylaxis: Heparin sq  Code Status: Full Code  Family Communication: None at bedside  Disposition Plan: Home in 1-2 days if diarrhea improve   Consultants:   Gen surgery   Procedures:   None   Antimicrobials: Anti-infectives (From admission, onward)   Start     Dose/Rate Route Frequency Ordered Stop   07/11/18 1000  vancomycin (VANCOCIN) 50 mg/mL oral solution 125 mg     125 mg Oral Every 3 DAYS 06/04/18 1854 07/20/18 0959   07/03/18 1000  vancomycin (VANCOCIN) 50 mg/mL oral solution 125 mg     125 mg Oral Every other day 06/04/18 1854 07/11/18 0959   06/26/18 1000  vancomycin (VANCOCIN) 50 mg/mL oral solution 125 mg     125 mg Oral Daily 06/04/18 1854 07/03/18 0959   06/18/18 2200  vancomycin (VANCOCIN) 50 mg/mL oral solution 125 mg     125 mg Oral 2 times daily 06/04/18 1854 06/25/18 2159   06/04/18 2000  vancomycin (VANCOCIN) 50 mg/mL oral solution 125 mg  Status:  Discontinued     125 mg Oral 4 times daily 06/04/18 1854 06/04/18 1900   06/04/18 2000  vancomycin (VANCOCIN) 50 mg/mL oral solution 125 mg     125 mg Oral 4 times daily 06/04/18 1854 06/18/18 2159   06/04/18 1615  ciprofloxacin (CIPRO) tablet 500 mg     500 mg Oral  Once 06/04/18 1613 06/04/18 1617   06/04/18 1615  metroNIDAZOLE (FLAGYL) tablet 500 mg     500 mg Oral  Once 06/04/18 1613 06/04/18 1617          Objective: Vitals:   06/04/18 2023 06/05/18 0535 06/05/18 0908 06/05/18 1417  BP: (!) 148/60 (!) 144/62 137/83 (!) 165/60  Pulse: 61 73 96 72  Resp: 19 18  18   Temp: 98.5 F (36.9 C) 98.2 F (36.8 C)  98.5 F (36.9 C)  TempSrc:    Oral  SpO2: 99% 98% 96% 98%  Weight:  75.9 kg (167 lb 5.3 oz)      Intake/Output Summary (Last 24 hours) at 06/05/2018 1505 Last data filed at 06/05/2018 0643 Gross per 24 hour  Intake 799.58 ml  Output 20 ml  Net 779.58 ml   Filed Weights   06/05/18 0535  Weight: 75.9 kg (167  lb 5.3 oz)    Examination:  General exam: Appears calm and comfortable  Respiratory system: Clear to auscultation. No wheezes,crackle or rhonchi Cardiovascular system: S1 & S2 heard, RRR. No JVD, murmurs, rubs or gallops Gastrointestinal system: Abdomen is nondistended, soft and nontender. No organomegaly or masses felt. Normal bowel sounds heard. JP in place, minimal drainage, staple in umbilical area  Central nervous system: Alert and oriented. No focal neurological deficits. Extremities: No pedal edema.  Skin: No rashes, lesions or ulcers Psychiatry: Mood & affect appropriate.    Data Reviewed: I have personally reviewed following labs and imaging studies  CBC: Recent Labs  Lab 06/04/18 1221 06/05/18 0514  WBC 19.6* 14.9*  NEUTROABS 13.8*  --   HGB 10.2* 8.7*  HCT 31.4* 27.0*  MCV 80.7 81.8  PLT 440* 349   Basic Metabolic Panel: Recent Labs  Lab 06/04/18 1221 06/05/18 0514  NA 141 142  K 2.6* 2.8*  CL 100 104  CO2 31 28  GLUCOSE 97 54*  BUN 8 6*  CREATININE 1.68* 1.27*  CALCIUM 8.5* 8.1*  MG 1.8 2.0   GFR: Estimated Creatinine Clearance: 51 mL/min (A) (by C-G formula based on SCr of 1.27 mg/dL (H)). Liver Function Tests: Recent Labs  Lab 06/04/18 1221 06/05/18 0514  AST 15 10*  ALT 8 8  ALKPHOS 96 79  BILITOT 0.7 0.6  PROT 7.4 6.2*  ALBUMIN 2.5* 2.1*   Recent Labs  Lab 06/04/18 1221  LIPASE 28   No results for input(s): AMMONIA in the last 168 hours. Coagulation Profile: No results for input(s): INR, PROTIME in the last 168 hours. Cardiac Enzymes: No results for input(s): CKTOTAL, CKMB, CKMBINDEX, TROPONINI in the last 168 hours. BNP (last 3 results) No results for input(s): PROBNP in the last 8760 hours. HbA1C: No results for input(s): HGBA1C in the last 72 hours. CBG: Recent Labs  Lab 06/04/18 2024 06/04/18 2105 06/05/18 0738 06/05/18 1136  GLUCAP 50* 163* 87 74   Lipid Profile: No results for input(s): CHOL, HDL, LDLCALC, TRIG,  CHOLHDL, LDLDIRECT in the last 72 hours. Thyroid Function Tests: No results for input(s): TSH, T4TOTAL, FREET4, T3FREE, THYROIDAB in the last 72 hours. Anemia Panel: No results for input(s): VITAMINB12, FOLATE, FERRITIN, TIBC, IRON, RETICCTPCT in the last 72 hours. Sepsis Labs: Recent Labs  Lab 06/04/18 1849 06/04/18 2219  LATICACIDVEN 1.1 0.6    Recent Results (from the past 240 hour(s))  C difficile quick scan w PCR reflex     Status: Abnormal   Collection Time: 06/04/18  2:31 PM  Result Value Ref Range Status   C Diff antigen POSITIVE (A) NEGATIVE Final   C Diff toxin POSITIVE (A) NEGATIVE Final   C Diff interpretation Toxin producing C. difficile detected.  Final    Comment: CRITICAL RESULT CALLED TO, READ BACK BY AND VERIFIED WITH: H.YONT AT 1840 ON 06/04/18 BY N.THOMPSON  Performed at [facility], [address] [place], [place] [address]       Radiology Studies: Ct Abdomen Pelvis Wo Contrast  Result Date: 06/04/2018 CLINICAL DATA:  72 year old male status post attempted laparoscopic cholecystectomy on 05/21/2018 with subsequent open drainage of the gallbladder and biopsy. Gallbladder adhesions to the liver, colon, and duodenum at the time of surgery. Diarrhea and nausea for 5 days. EXAM: CT ABDOMEN AND PELVIS WITHOUT CONTRAST TECHNIQUE: Multidetector CT imaging of the abdomen and pelvis was performed following the standard protocol without IV contrast. COMPARISON:  Noncontrast CT Abdomen and Pelvis 04/13/2018 and earlier. FINDINGS: Lower chest: Moderate layering right pleural effusion persists. Stable small layering left pleural effusion. No pericardial effusion. No lung base consolidation. Hepatobiliary: Sequelae of partial gallbladder resection with persistent right lateral approach surgical drain in place across the residual open gallbladder. There is trace perihepatic free fluid, mostly laterally. Gas within what appears to be the residual gallbladder is in  continuity with a thick-walled small bowel loop in the right upper quadrant as seen on coronal image 27, consistent with mature versus developing fistula. Thick-walled large bowel is also noted throughout the region, although no definite bowel to bowel fistula is identified. The duodenum is inseparable from the hepatic flexure. There is a small volume of oral contrast in the stomach and the large bowel from the cecum to the splenic flexure. Negative noncontrast liver parenchyma. Pancreas: Negative. Spleen: Negative. Adrenals/Urinary Tract: Chronic renal vascular calcifications. Stable noncontrast appearance of both kidneys and ureters. There is a small to moderate volume of gas within the urinary bladder. The bladder otherwise appears unremarkable. Stomach/Bowel: The rectum appears boggy and indistinct with surrounding mild mesenteric stranding, presacral stranding. The sigmoid colon is redundant. There is moderate to severe diverticulosis of the proximal sigmoid and descending colon with mild sigmoid mesenteric inflammatory stranding (series 2, image 65). The descending colon also appears thick walled. Diverticulosis continues to the splenic flexure. Oral contrast has reached the distal transverse colon which has a more normal appearance. The hepatic flexure and distal ascending colon are chronically abnormal with wall thickening and regional mesenteric stranding. This is inseparable from the open gallbladder drainage described above. The cecum and appendix remain within normal limits. See additional small bowel findings described above. Negative terminal ileum. No dilated small bowel. The proximal stomach is normal. The gastric antrum is inseparable from the duodenal thickening. There is no abdominal free air.  No organized fluid collection. Vascular/Lymphatic: Extensive Aortoiliac calcified atherosclerosis. Vascular patency is not evaluated in the absence of IV contrast. Reproductive: Negative. Other: Mild  presacral stranding, but no pelvic free fluid. There are postoperative changes to the ventral abdominal wall including skin staples in the right upper quadrant with a small volume of abdominal wall intramuscular gas and fluid (series 2, image 18). Musculoskeletal: No acute osseous abnormality identified. IMPRESSION: 1. Sequelae of attempted cholecystectomy with open gallbladder and residual surgical drain in place. Underlying chronic regional right upper quadrant inflammation and adhesions. 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 No other fistula is evident, although the hepatic flexure is inseparable from the duodenum. 2. Small volume right abdominal wall intramuscular air and fluid underlying the right upper quadrant incision is indeterminate for abscess versus seroma. 3. The descending and rectosigmoid colon appear newly inflamed since 04/13/2018, such as due to mild colitis versus diverticulitis. 4. Trace perihepatic free fluid with no organized intra-abdominal fluid collection. 5. Gas within the urinary bladder  which is suspicious for UTI unless explained by recent catheterization. 6. Stable moderate right and small left layering pleural effusions. Electronically Signed   By: Odessa Fleming M.D.   On: 06/04/2018 15:48      Scheduled Meds: . aspirin EC  81 mg Oral Daily  . feeding supplement (ENSURE ENLIVE)  237 mL Oral BID BM  . heparin injection (subcutaneous)  5,000 Units Subcutaneous Q8H  . insulin aspart  0-9 Units Subcutaneous TID WC  . metoprolol succinate  50 mg Oral Daily  . simvastatin  40 mg Oral Daily  . timolol  1 drop Both Eyes BID  . vancomycin  125 mg Oral QID   Followed by  . [START ON 06/18/2018] vancomycin  125 mg Oral BID   Followed by  . [START ON 06/26/2018] vancomycin  125 mg Oral Daily   Followed by  . [START ON 07/03/2018] vancomycin  125 mg Oral QODAY   Followed by  . [START ON 07/11/2018] vancomycin  125 mg Oral Q3  days   Continuous Infusions: . lactated ringers 75 mL/hr at 06/05/18 1148     LOS: 1 day   Time spent: Total of 25 minutes spent with pt, greater than 50% of which was spent in discussion of  treatment, counseling and coordination of care  Latrelle Dodrill, MD Pager: Text Page via [email or web address]   If 7PM-7AM, please contact night-coverage [email or web address] 06/05/2018, 3:05 PM   Note - This record has been created using [employer]. Chart creation errors have been sought, but may not always have been located. Such creation errors do not reflect on the standard of medical care.

## 2018-06-06 DIAGNOSIS — A0471 Enterocolitis due to Clostridium difficile, recurrent: Secondary | ICD-10-CM | POA: Diagnosis not present

## 2018-06-06 DIAGNOSIS — N179 Acute kidney failure, unspecified: Secondary | ICD-10-CM | POA: Diagnosis not present

## 2018-06-06 DIAGNOSIS — E876 Hypokalemia: Secondary | ICD-10-CM | POA: Diagnosis not present

## 2018-06-06 DIAGNOSIS — A0472 Enterocolitis due to Clostridium difficile, not specified as recurrent: Secondary | ICD-10-CM | POA: Diagnosis not present

## 2018-06-06 LAB — CBC WITH DIFFERENTIAL/PLATELET
Basophils Absolute: 0 10*3/uL (ref 0.0–0.1)
Basophils Relative: 0 %
Eosinophils Absolute: 0.7 10*3/uL (ref 0.0–0.7)
Eosinophils Relative: 7 %
HCT: 25.6 % — ABNORMAL LOW (ref 39.0–52.0)
Hemoglobin: 8.2 g/dL — ABNORMAL LOW (ref 13.0–17.0)
Lymphocytes Relative: 29 %
Lymphs Abs: 2.8 10*3/uL (ref 0.7–4.0)
MCH: 26 pg (ref 26.0–34.0)
MCHC: 32 g/dL (ref 30.0–36.0)
MCV: 81.3 fL (ref 78.0–100.0)
Monocytes Absolute: 0.9 10*3/uL (ref 0.1–1.0)
Monocytes Relative: 9 %
Neutro Abs: 5.3 10*3/uL (ref 1.7–7.7)
Neutrophils Relative %: 55 %
Platelets: 314 10*3/uL (ref 150–400)
RBC: 3.15 MIL/uL — ABNORMAL LOW (ref 4.22–5.81)
RDW: 16.9 % — ABNORMAL HIGH (ref 11.5–15.5)
WBC: 9.8 10*3/uL (ref 4.0–10.5)

## 2018-06-06 LAB — GLUCOSE, CAPILLARY
Glucose-Capillary: 87 mg/dL (ref 70–99)
Glucose-Capillary: 74 mg/dL (ref 70–99)
Glucose-Capillary: 102 mg/dL — ABNORMAL HIGH (ref 70–99)

## 2018-06-06 LAB — COMPREHENSIVE METABOLIC PANEL
ALT: 6 U/L (ref 0–44)
AST: 12 U/L — ABNORMAL LOW (ref 15–41)
Albumin: 2 g/dL — ABNORMAL LOW (ref 3.5–5.0)
Alkaline Phosphatase: 70 U/L (ref 38–126)
Anion gap: 6 (ref 5–15)
BUN: 5 mg/dL — ABNORMAL LOW (ref 8–23)
CO2: 29 mmol/L (ref 22–32)
Calcium: 8.1 mg/dL — ABNORMAL LOW (ref 8.9–10.3)
Chloride: 105 mmol/L (ref 98–111)
Creatinine, Ser: 1.02 mg/dL (ref 0.61–1.24)
GFR calc Af Amer: >60 mL/min (ref >60)
GFR calc non Af Amer: >60 mL/min (ref >60)
Glucose, Bld: 76 mg/dL (ref 70–99)
Potassium: 3.1 mmol/L — ABNORMAL LOW (ref 3.5–5.1)
Sodium: 140 mmol/L (ref 135–145)
Total Bilirubin: 0.4 mg/dL (ref 0.3–1.2)
Total Protein: 5.8 g/dL — ABNORMAL LOW (ref 6.5–8.1)

## 2018-06-06 LAB — MAGNESIUM: Magnesium: 1.6 mg/dL — ABNORMAL LOW (ref 1.7–2.4)

## 2018-06-06 MED ORDER — POTASSIUM CHLORIDE 10 MEQ/100ML IV SOLN
10.0000 meq | INTRAVENOUS | Status: AC
  Administered 2018-06-06 (×3): 10 meq via INTRAVENOUS
  Filled 2018-06-06 (×3): qty 100

## 2018-06-06 MED ORDER — VANCOMYCIN 50 MG/ML ORAL SOLUTION: ORAL | 0 refills | Status: DC

## 2018-06-06 MED ORDER — MAGNESIUM SULFATE 2 GM/50ML IV SOLN
2.0000 g | Freq: Once | INTRAVENOUS | Status: AC
  Administered 2018-06-06: 2 g via INTRAVENOUS
  Filled 2018-06-06: qty 50

## 2018-06-06 NOTE — Evaluation (Signed)
Physical Therapy Evaluation Patient Details Name: Jeremiah [name] MRN: [id number] DOB: 10/23/1946 Today's Date: 06/06/2018   History of Present Illness    72 year old male with medical history significant for PVD, hypertension, type 2 diabetes mellitus, chronic diastolic CHF, status post open cholecystectomy with JP drain of the gallbladder, recently treated for C. difficile.  Patient presented to the emergency department complaining of diarrhea that started 5 days prior to admission associated with poor oral intake and chills.     Clinical Impression  Pt presents at/near baseline functional status, requires some assist and spouse able to provide.  Has all DME needs met and recommend re-start HHPT with current agency.  No other needs, d/c home pending today.    Follow Up Recommendations Home health PT(continue with current agency)    Equipment Recommendations  None recommended by PT    Recommendations for Other Services       Precautions / Restrictions Precautions Precautions: Fall Restrictions Weight Bearing Restrictions: No Other Position/Activity Restrictions: JP drain L abdomen      Mobility  Bed Mobility Overal bed mobility: Modified Independent                Transfers Overall transfer level: Needs assistance Equipment used: Rolling walker (2 wheeled) Transfers: Sit to/from Stand Sit to Stand: Min guard         General transfer comment: needs incr assist for liftoff and instruction for safest hand placement; steady upon standing  Ambulation/Gait Ambulation/Gait assistance: Supervision Gait Distance (Feet): 125 Feet Assistive device: Rolling walker (2 wheeled) Gait Pattern/deviations: Step-through pattern;Trunk flexed Gait velocity: decreased   General Gait Details: impaired dual tasking -- fails walk/talk test; otherwise slow and steady, denies fatigue or dizziness  Stairs            Wheelchair Mobility    Modified Rankin (Stroke Patients  Only)       Balance Overall balance assessment: Mild deficits observed, not formally tested   Sitting balance-Leahy Scale: Good     Standing balance support: Bilateral upper extremity supported;During functional activity Standing balance-Leahy Scale: Fair                               Pertinent Vitals/Pain Pain Assessment: No/denies pain    Home Living Family/patient expects to be discharged to:: Private residence Living Arrangements: Spouse/significant other Available Help at Discharge: Family;Available PRN/intermittently Type of Home: House Home Access: Stairs to enter Entrance Stairs-Rails: Psychiatric nurse of Steps: 4 Home Layout: One level Home Equipment: Walker - 2 wheels;Shower seat Additional Comments: wife has been helping with sponge bathing due to drain    Prior Function Level of Independence: Independent with assistive device(s)         Comments: uses RW for all mobility and wife helps as needed     Hand Dominance   Dominant Hand: Right    Extremity/Trunk Assessment   Upper Extremity Assessment Upper Extremity Assessment: Overall WFL for tasks assessed    Lower Extremity Assessment Lower Extremity Assessment: Overall WFL for tasks assessed;Generalized weakness(mild weakness in quads for standing)       Communication   Communication: No difficulties  Cognition Arousal/Alertness: Awake/alert Behavior During Therapy: WFL for tasks assessed/performed Overall Cognitive Status: Within Functional Limits for tasks assessed  General Comments General comments (skin integrity, edema, etc.): JP drain in place    Exercises     Assessment/Plan    PT Assessment All further PT needs can be met in the next venue of care  PT Problem List Decreased strength;Decreased activity tolerance;Decreased mobility       PT Treatment Interventions      PT Goals (Current goals  can be found in the Care Plan section)  Acute Rehab PT Goals Patient Stated Goal: to return home PT Goal Formulation: All assessment and education complete, DC therapy    Frequency     Barriers to discharge        Co-evaluation               AM-PAC PT "6 Clicks" Daily Activity  Outcome Measure Difficulty turning over in bed (including adjusting bedclothes, sheets and blankets)?: None Difficulty moving from lying on back to sitting on the side of the bed? : A Little Difficulty sitting down on and standing up from a chair with arms (e.g., wheelchair, bedside commode, etc,.)?: A Lot Help needed moving to and from a bed to chair (including a wheelchair)?: None Help needed walking in hospital room?: None Help needed climbing 3-5 steps with a railing? : A Little 6 Click Score: 20    End of Session Equipment Utilized During Treatment: Gait belt   Patient left: in bed;with call bell/phone within reach;with family/visitor present Nurse Communication: Mobility status PT Visit Diagnosis: Difficulty in walking, not elsewhere classified (R26.2)    Time: 3241-9914 PT Time Calculation (min) (ACUTE ONLY): 25 min   Charges:   PT Evaluation $PT Eval Low Complexity: 1 Low PT Treatments $Therapeutic Activity: 8-22 mins   PT G Codes:        Kearney Hard, PT, DPT, MS Board Certified Geriatric Clinical Specialist  Jeremiah [name] 06/06/2018, 12:44 PM

## 2018-06-06 NOTE — Progress Notes (Signed)
Patient ID: Jeremiah [name], male   DOB: 02/22/1946, 72 y.o.   MRN: [id number] Better from c diff, staples out Abd soft nt Will f/u Dr Carolynne Edouardoth as in dc plan

## 2018-06-06 NOTE — Care Management Note (Signed)
Case Management Note  Patient Details  Name: Jeremiah [name] MRN: [id number] Date of Birth: Oct 04, 1946  Subjective/Objective:Patient already active w/[facility]/PT/social worker;has rw. [facility] rep Jermaine aware of resumption of HHC orders, & d/c. No further CM needs.                    Action/Plan:d/c home w/HHC.   Expected Discharge Date:  06/06/18               Expected Discharge Plan:  Home w Home Health Services  In-House Referral:     Discharge planning Services  CM Consult  Post Acute Care Choice:  Home Health, Durable Medical Equipment(has rw;Active w/[facility]/PT/CSW) Choice offered to:  Patient  DME Arranged:    DME Agency:     HH Arranged:  RN, PT, Social Work [facility]  Status of Service:  Completed, signed off  If discussed at [employer] Length of [employer] Meetings, dates discussed:    Additional Comments:  Jeremiah [name], Jeremiah Basnett, RN 06/06/2018, 12:49 PM

## 2018-06-06 NOTE — Discharge Summary (Signed)
Physician Discharge Summary  TAVIN VERNET  ZOX:[id number]  DOB: 06/10/1946  DOA: 06/04/2018 PCP: Rinaldo Cloud, MD  Admit date: 06/04/2018 Discharge date: 06/06/2018  Admitted From: Home Disposition: Home  Recommendations for Outpatient Follow-up:  1. Follow up with PCP in 1 week 2. Please obtain BMP/CBC in one week to monitor renal function and electrolytes 3. Please follow up on the following pending results: Blood cultures  Home Health: PT  Discharge Condition: Stable CODE STATUS: Full code Diet recommendation: Heart Healthy   Brief/Interim Summary: For full details see H&P/Progress note, but in brief, Jeremiah [name] is a 72 year old male with medical history significant for PVD, hypertension, type 2 diabetes mellitus, chronic diastolic CHF, status post open cholecystectomy with JP drain of the gallbladder, recently treated for C. difficile.  Patient presented to the emergency department complaining of diarrhea that started 5 days prior to admission associated with poor oral intake and chills.  Upon ED evaluation T-max was 99.6, labs remarkable for leukocytosis at 19.6, hypokalemia 2.6 elevated creatinine at 1.6 CT abdomen and pelvis with suspicious of diverticulitis versus colitis.  C. difficile was positive for infectious process.  Patient was admitted with recurrent C. difficile colitis and started on oral vancomycin pulse taper.  General surgery was consulted.  Subjective: Patient seen and examined, only 2 diarrheal bowel movement since last night.  He continues to be asymptomatic, denies nausea and vomiting. Tolerating diet well   Discharge Diagnoses/Hospital Course:  Acute colitis due to C. Difficile - clinically improving  Severe infection with leukocytosis and AKI. Recurrent C. difficile patient completed treatment about a month ago, Will treat with vancomycin pulse taper. Diarrhea has diminish. WBC has improved. Blood cultures no growth so far. Follow-up CBC in 1 week with  PCP.   Severe hypokalemia Secondary to diarrhea.  Repleted with IV potassium, monitor electrolytes in 1 week.  Will discharge on KDur 10 mEq daily for 1 week.  AKI on CKD stage III - resolved Felt to be prerenal from dehydration/diarrhea Encourage p.o. intake Monitor BMP in 1 week  Status post open gallbladder drainage Pathology showed xanthogranulomatous cholecystitis  JP drain in place. Follow up with Dr Carolynne Edouard as previous indicated   Type 2 diabetes mellitus CBG's stable, resume home medications with no changes   Chronic diastolic CHF No signs of fluid overload, seems to be euvolemic at this time Continue metoprolol and resume Lisinopril  Follow up with PCP   On the day of the discharge the patient's vitals were stable, and no other acute medical condition were reported by patient. the patient was felt safe to be discharge to home.   Discharge Instructions  You were cared for by a hospitalist during your hospital stay. If you have any questions about your discharge medications or the care you received while you were in the hospital after you are discharged, you can call the unit and asked to speak with the hospitalist on call if the hospitalist that took care of you is not available. Once you are discharged, your primary care physician will handle any further medical issues. Please note that NO REFILLS for any discharge medications will be authorized once you are discharged, as it is imperative that you return to your primary care physician (or establish a relationship with a primary care physician if you do not have one) for your aftercare needs so that they can reassess your need for medications and monitor your lab values. Discharge Instructions    Call MD for:  difficulty breathing,  headache or visual disturbances   Complete by:  As directed    Call MD for:  extreme fatigue   Complete by:  As directed    Call MD for:  hives   Complete by:  As directed    Call MD for:   persistant dizziness or light-headedness   Complete by:  As directed    Call MD for:  persistant nausea and vomiting   Complete by:  As directed    Call MD for:  redness, tenderness, or signs of infection (pain, swelling, redness, odor or green/yellow discharge around incision site)   Complete by:  As directed    Call MD for:  severe uncontrolled pain   Complete by:  As directed    Call MD for:  temperature >100.4   Complete by:  As directed    Diet - low sodium heart healthy   Complete by:  As directed    Increase activity slowly   Complete by:  As directed      Allergies as of 06/06/2018   No Known Allergies     Medication List    TAKE these medications   aspirin 81 MG tablet Take 81 mg by mouth daily.   brimonidine 0.2 % ophthalmic solution Commonly known as:  ALPHAGAN Place 1 drop into both eyes 2 (two) times daily.   glimepiride 4 MG tablet Commonly known as:  AMARYL Take 1 tablet (4 mg total) by mouth daily with breakfast. What changed:  when to take this   HYDROcodone-acetaminophen 5-325 MG tablet Commonly known as:  NORCO/VICODIN Take 1-2 tablets by mouth every 6 (six) hours as needed for moderate pain.   losartan 100 MG tablet Commonly known as:  COZAAR Take 100 mg by mouth daily.   metoprolol succinate 50 MG 24 hr tablet Commonly known as:  TOPROL-XL Take 1 tablet (50 mg total) by mouth daily. Take with or immediately following a meal.   simvastatin 40 MG tablet Commonly known as:  ZOCOR Take 40 mg by mouth daily.   timolol 0.5 % ophthalmic solution Commonly known as:  TIMOPTIC Place 1 drop into both eyes 2 (two) times daily.   vancomycin 50 mg/mL oral solution Commonly known as:  VANCOCIN Take 2.5 mLs (125 mg total) by mouth 4 (four) times daily for 12 days, THEN 2.5 mLs (125 mg total) 2 (two) times daily for 7 days, THEN 2.5 mLs (125 mg total) daily for 7 days, THEN 2.5 mLs (125 mg total) every 3 (three) days for 14 days. Start taking on:   06/06/2018      Follow-up Information    Chevis Pretty III, MD Follow up in 1 week(s).   Specialty:  General Surgery Contact information: [address] [place] [address] [phone]        Rinaldo Cloud, MD. Schedule an appointment as soon as possible for a visit in 1 week(s).   Specialty:  Cardiology Why:  Hospital follow up  Contact information: [address] [place] [address] [phone]          No Known Allergies  Consultations:  General surgery    Procedures/Studies: Ct Abdomen Pelvis Wo Contrast  Result Date: 06/04/2018 CLINICAL DATA:  72 year old male status post attempted laparoscopic cholecystectomy on 05/21/2018 with subsequent open drainage of the gallbladder and biopsy. Gallbladder adhesions to the liver, colon, and duodenum at the time of surgery. Diarrhea and nausea for 5 days. EXAM: CT ABDOMEN AND PELVIS WITHOUT CONTRAST  TECHNIQUE: Multidetector CT imaging of the abdomen and pelvis was performed following the standard protocol without IV contrast. COMPARISON:  Noncontrast CT Abdomen and Pelvis 04/13/2018 and earlier. FINDINGS: Lower chest: Moderate layering right pleural effusion persists. Stable small layering left pleural effusion. No pericardial effusion. No lung base consolidation. Hepatobiliary: Sequelae of partial gallbladder resection with persistent right lateral approach surgical drain in place across the residual open gallbladder. There is trace perihepatic free fluid, mostly laterally. Gas within what appears to be the residual gallbladder is in continuity with a thick-walled small bowel loop in the right upper quadrant as seen on coronal image 27, consistent with mature versus developing fistula. Thick-walled large bowel is also noted throughout the region, although no definite bowel to bowel fistula is identified. The duodenum is inseparable from the hepatic flexure. There is a small volume of oral contrast in the  stomach and the large bowel from the cecum to the splenic flexure. Negative noncontrast liver parenchyma. Pancreas: Negative. Spleen: Negative. Adrenals/Urinary Tract: Chronic renal vascular calcifications. Stable noncontrast appearance of both kidneys and ureters. There is a small to moderate volume of gas within the urinary bladder. The bladder otherwise appears unremarkable. Stomach/Bowel: The rectum appears boggy and indistinct with surrounding mild mesenteric stranding, presacral stranding. The sigmoid colon is redundant. There is moderate to severe diverticulosis of the proximal sigmoid and descending colon with mild sigmoid mesenteric inflammatory stranding (series 2, image 65). The descending colon also appears thick walled. Diverticulosis continues to the splenic flexure. Oral contrast has reached the distal transverse colon which has a more normal appearance. The hepatic flexure and distal ascending colon are chronically abnormal with wall thickening and regional mesenteric stranding. This is inseparable from the open gallbladder drainage described above. The cecum and appendix remain within normal limits. See additional small bowel findings described above. Negative terminal ileum. No dilated small bowel. The proximal stomach is normal. The gastric antrum is inseparable from the duodenal thickening. There is no abdominal free air.  No organized fluid collection. Vascular/Lymphatic: Extensive Aortoiliac calcified atherosclerosis. Vascular patency is not evaluated in the absence of IV contrast. Reproductive: Negative. Other: Mild presacral stranding, but no pelvic free fluid. There are postoperative changes to the ventral abdominal wall including skin staples in the right upper quadrant with a small volume of abdominal wall intramuscular gas and fluid (series 2, image 18). Musculoskeletal: No acute osseous abnormality identified. IMPRESSION: 1. Sequelae of attempted cholecystectomy with open gallbladder and  residual surgical drain in place. Underlying chronic regional right upper quadrant inflammation and adhesions. 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 No other fistula is evident, although the hepatic flexure is inseparable from the duodenum. 2. Small volume right abdominal wall intramuscular air and fluid underlying the right upper quadrant incision is indeterminate for abscess versus seroma. 3. The descending and rectosigmoid colon appear newly inflamed since 04/13/2018, such as due to mild colitis versus diverticulitis. 4. Trace perihepatic free fluid with no organized intra-abdominal fluid collection. 5. Gas within the urinary bladder which is suspicious for UTI unless explained by recent catheterization. 6. Stable moderate right and small left layering pleural effusions. Electronically Signed   By: Odessa Fleming M.D.   On: 06/04/2018 15:48   Dg Chest 2 View  Result Date: 05/18/2018 CLINICAL DATA:  Preoperative evaluation for upcoming gallbladder surgery EXAM: CHEST - 2 VIEW COMPARISON:  03/27/2018 FINDINGS: Cardiac shadow is within normal limits. Previously seen bibasilar changes have nearly completely resolved. Very  minimal posteriorly layering effusions are noted. Cholecystostomy tube is noted in the right upper quadrant. IMPRESSION: Previously seen bibasilar infiltrates have resolved with only minimal pleural effusions remain. Electronically Signed   By: Alcide CleverMark  Lukens M.D.   On: 05/18/2018 14:36   Nm Myocar Multi W/spect W/wall Motion / Ef  Result Date: 05/14/2018 CLINICAL DATA:  Peripheral vascular disease. Diabetes mellitus. Pre-cholecystectomy evaluation. EXAM: MYOCARDIAL IMAGING WITH SPECT (REST AND PHARMACOLOGIC-STRESS) GATED LEFT VENTRICULAR WALL MOTION STUDY LEFT VENTRICULAR EJECTION FRACTION TECHNIQUE: Standard myocardial SPECT imaging was performed after resting intravenous injection of 10 mCi Tc-6067m tetrofosmin. Subsequently, intravenous  infusion of Lexiscan was performed under the supervision of the Cardiology staff. At peak effect of the drug, 30 mCi Tc-1667m tetrofosmin was injected intravenously and standard myocardial SPECT imaging was performed. Quantitative gated imaging was also performed to evaluate left ventricular wall motion, and estimate left ventricular ejection fraction. COMPARISON:  Chest radiographs 03/27/2018. Nuclear medicine myocardial scan 06/22/2006. FINDINGS: Perfusion: New decreased activity in the mid inferior wall appears predominately fixed although there may be mild reversibility. There is also a possible small area of reversibility in the distal anterior septal wall. Wall Motion: No focal wall motion abnormality or significant ventricular dilatation. Left Ventricular Ejection Fraction: 51 %.  Previously 69%. End diastolic volume 83 ml End systolic volume 41 ml IMPRESSION: 1. Probable interval inferior wall infarct with possible mild peri-infarct ischemia. Additional possible small area of inducible ischemia in the distal anteroseptal wall. 2. Interval decreased left ventricular ejection fraction without focal wall motion abnormality. 3. Left ventricular ejection fraction 51% 4. Non invasive risk stratification*: Low *2012 Appropriate Use Criteria for Coronary Revascularization Focused Update: J Am Coll Cardiol. 2012;59(9):857-881. http://content.dementiazones.comonlinejacc.org/article.aspx?articleid=1201161 Electronically Signed   By: Carey BullocksWilliam  Veazey M.D.   On: 05/14/2018 14:34    Discharge Exam: Vitals:   06/05/18 1955 06/06/18 0603  BP: 140/61 (!) 158/60  Pulse: 72 64  Resp: 19 18  Temp: 97.9 F (36.6 C) 97.9 F (36.6 C)  SpO2: 96% 98%   Vitals:   06/05/18 1417 06/05/18 1955 06/06/18 0500 06/06/18 0603  BP: (!) 165/60 140/61  (!) 158/60  Pulse: 72 72  64  Resp: 18 19  18   Temp: 98.5 F (36.9 C) 97.9 F (36.6 C)  97.9 F (36.6 C)  TempSrc: Oral Oral  Oral  SpO2: 98% 96%  98%  Weight:   77.9 kg (171 lb 11.8 oz)  78.2 kg (172 lb 6.4 oz)    General: NAD  Cardiovascular: RRR, S1/S2 +, no rubs, no gallops Respiratory: CTA bilaterally, no wheezing, no rhonchi Abdominal: Soft, NT, ND, bowel sounds +, JP in place minimal drainage, staples were removed  Extremities: no edema.   The results of significant diagnostics from this hospitalization (including imaging, microbiology, ancillary and laboratory) are listed below for reference.     Microbiology: Recent Results (from the past 240 hour(s))  C difficile quick scan w PCR reflex     Status: Abnormal   Collection Time: 06/04/18  2:31 PM  Result Value Ref Range Status   C Diff antigen POSITIVE (A) NEGATIVE Final   C Diff toxin POSITIVE (A) NEGATIVE Final   C Diff interpretation Toxin producing C. difficile detected.  Final    Comment: CRITICAL RESULT CALLED TO, READ BACK BY AND VERIFIED WITH: H.YONT AT 1840 ON 06/04/18 BY N.THOMPSON Performed at [facility], [address] [place], [place] [id number]   Culture, blood (routine x 2)     Status: None (Preliminary result)  Collection Time: 06/04/18  6:49 PM  Result Value Ref Range Status   Specimen Description   Final    BLOOD RIGHT HAND Performed at [facility], [address], [place], [place] [address]    Special Requests   Final    BOTTLES DRAWN AEROBIC ONLY Blood Culture results may not be optimal due to an inadequate volume of blood received in culture bottles Performed at [facility], [address], [place], [place] [address]    Culture   Final    NO GROWTH < 24 HOURS Performed at [facility], [address] [place], [place] [address]    Report Status PENDING  Incomplete  Culture, blood (routine x 2)     Status: None (Preliminary result)   Collection Time: 06/04/18  6:49 PM  Result Value Ref Range Status   Specimen Description   Final    BLOOD LEFT HAND Performed at [facility], [address],  [place], [place] [address]    Special Requests   Final    BOTTLES DRAWN AEROBIC ONLY Blood Culture results may not be optimal due to an inadequate volume of blood received in culture bottles Performed at [facility], [address] [place], [place] [address]    Culture   Final    NO GROWTH < 24 HOURS Performed at [facility], [address] [place], [place] [address]    Report Status PENDING  Incomplete     Labs: BNP (last 3 results) Recent Labs    03/27/18 1239 03/31/18 0519 03/31/18 1903  BNP 439.0* 526.8* 617.4*   Basic Metabolic Panel: Recent Labs  Lab 06/04/18 1221 06/05/18 0514 06/06/18 0535  NA 141 142 140  K 2.6* 2.8* 3.1*  CL 100 104 105  CO2 31 28 29   GLUCOSE 97 54* 76  BUN 8 6* 5*  CREATININE 1.68* 1.27* 1.02  CALCIUM 8.5* 8.1* 8.1*  MG 1.8 2.0 1.6*   Liver Function Tests: Recent Labs  Lab 06/04/18 1221 06/05/18 0514 06/06/18 0535  AST 15 10* 12*  ALT 8 8 6   ALKPHOS 96 79 70  BILITOT 0.7 0.6 0.4  PROT 7.4 6.2* 5.8*  ALBUMIN 2.5* 2.1* 2.0*   Recent Labs  Lab 06/04/18 1221  LIPASE 28   No results for input(s): AMMONIA in the last 168 hours. CBC: Recent Labs  Lab 06/04/18 1221 06/05/18 0514 06/06/18 0535  WBC 19.6* 14.9* 9.8  NEUTROABS 13.8*  --  5.3  HGB 10.2* 8.7* 8.2*  HCT 31.4* 27.0* 25.6*  MCV 80.7 81.8 81.3  PLT 440* 349 314   Cardiac Enzymes: No results for input(s): CKTOTAL, CKMB, CKMBINDEX, TROPONINI in the last 168 hours. BNP: Invalid input(s): POCBNP CBG: Recent Labs  Lab 06/05/18 1136 06/05/18 1607 06/05/18 2003 06/06/18 0744 06/06/18 1220  GLUCAP 74 104* 87 74 102*   D-Dimer No results for input(s): DDIMER in the last 72 hours. Hgb A1c No results for input(s): HGBA1C in the last 72 hours. Lipid Profile No results for input(s): CHOL, HDL, LDLCALC, TRIG, CHOLHDL, LDLDIRECT in the last 72 hours. Thyroid function studies No results for input(s): TSH, T4TOTAL, T3FREE, THYROIDAB in the  last 72 hours.  Invalid input(s): FREET3 Anemia work up No results for input(s): VITAMINB12, FOLATE, FERRITIN, TIBC, IRON, RETICCTPCT in the last 72 hours. Urinalysis    Component Value Date/Time   COLORURINE YELLOW 06/04/2018 1412   APPEARANCEUR CLEAR 06/04/2018 1412   LABSPEC 1.008  06/04/2018 1412   PHURINE 6.0 06/04/2018 1412   GLUCOSEU NEGATIVE 06/04/2018 1412   HGBUR NEGATIVE 06/04/2018 1412   BILIRUBINUR NEGATIVE 06/04/2018 1412   KETONESUR NEGATIVE 06/04/2018 1412   PROTEINUR 100 (A) 06/04/2018 1412   UROBILINOGEN 0.2 01/09/2009 1532   NITRITE NEGATIVE 06/04/2018 1412   LEUKOCYTESUR NEGATIVE 06/04/2018 1412   Sepsis Labs Invalid input(s): PROCALCITONIN,  WBC,  LACTICIDVEN Microbiology Recent Results (from the past 240 hour(s))  C difficile quick scan w PCR reflex     Status: Abnormal   Collection Time: 06/04/18  2:31 PM  Result Value Ref Range Status   C Diff antigen POSITIVE (A) NEGATIVE Final   C Diff toxin POSITIVE (A) NEGATIVE Final   C Diff interpretation Toxin producing C. difficile detected.  Final    Comment: CRITICAL RESULT CALLED TO, READ BACK BY AND VERIFIED WITH: H.YONT AT 1840 ON 06/04/18 BY N.THOMPSON Performed at [facility], [address] [place], [place] [address]   Culture, blood (routine x 2)     Status: None (Preliminary result)   Collection Time: 06/04/18  6:49 PM  Result Value Ref Range Status   Specimen Description   Final    BLOOD RIGHT HAND Performed at [facility], [address], [place], [place] [address]    Special Requests   Final    BOTTLES DRAWN AEROBIC ONLY Blood Culture results may not be optimal due to an inadequate volume of blood received in culture bottles Performed at [facility], [address], [place], [place] [address]    Culture   Final    NO GROWTH < 24 HOURS Performed at [facility], [address], [place], [place] [address]    Report Status  PENDING  Incomplete  Culture, blood (routine x 2)     Status: None (Preliminary result)   Collection Time: 06/04/18  6:49 PM  Result Value Ref Range Status   Specimen Description   Final    BLOOD LEFT HAND Performed at [facility], [address], [place], [place] [address]    Special Requests   Final    BOTTLES DRAWN AEROBIC ONLY Blood Culture results may not be optimal due to an inadequate volume of blood received in culture bottles Performed at [facility], [address], [place], [place] [address]    Culture   Final    NO GROWTH < 24 HOURS Performed at [facility], [address], [place], [place] [address]    Report Status PENDING  Incomplete    Time coordinating discharge: 35 minutes  SIGNED:  Latrelle Dodrill, MD  [facility] 06/06/2018, 12:46 PM  Pager please text page via  [email or web address]  Note - This record has been created using [employer]. Chart creation errors have been sought, but may not always have been located. Such creation errors do not reflect on the standard of medical care.

## 2018-06-06 NOTE — Progress Notes (Signed)
Discharge instructions and medications discussed with patient and wife.  Prescription and AVS given to wife. All questions answered.

## 2018-06-07 MED FILL — VANCOMYCIN 50MG/ML-ORAL SOL: 12 days supply | Qty: 120 | Fill #0

## 2018-06-09 LAB — CULTURE, BLOOD (ROUTINE X 2)
Culture: NO GROWTH
Culture: NO GROWTH

## 2018-06-14 MED FILL — VANCOMYCIN 50MG/ML ORAL SOL: 50MG/1ML | 14 days supply | Qty: 100 | Fill #0

## 2018-06-21 MED FILL — VANCOMYCIN 50MG/ML-ORAL SOL: 14 days supply | Qty: 20 | Fill #0

## 2018-07-07 ENCOUNTER — Inpatient Hospital Stay (HOSPITAL_COMMUNITY)
Admission: EM | Admit: 2018-07-07 | Discharge: 2018-07-11 | DRG: 872 | Disposition: A | Discharge status: Home Health | Payer: Medicare Other | Attending: Internal Medicine | Admitting: Internal Medicine

## 2018-07-07 ENCOUNTER — Emergency Department (HOSPITAL_COMMUNITY): Payer: Medicare Other

## 2018-07-07 ENCOUNTER — Encounter (HOSPITAL_COMMUNITY): Payer: Self-pay

## 2018-07-07 ENCOUNTER — Other Ambulatory Visit: Payer: Self-pay

## 2018-07-07 DIAGNOSIS — D649 Anemia, unspecified: Secondary | ICD-10-CM | POA: Diagnosis present

## 2018-07-07 DIAGNOSIS — N3000 Acute cystitis without hematuria: Secondary | ICD-10-CM

## 2018-07-07 DIAGNOSIS — K625 Hemorrhage of anus and rectum: Secondary | ICD-10-CM | POA: Diagnosis not present

## 2018-07-07 DIAGNOSIS — Z2239 Carrier of other specified bacterial diseases: Secondary | ICD-10-CM

## 2018-07-07 DIAGNOSIS — E46 Unspecified protein-calorie malnutrition: Secondary | ICD-10-CM | POA: Diagnosis present

## 2018-07-07 DIAGNOSIS — Z7984 Long term (current) use of oral hypoglycemic drugs: Secondary | ICD-10-CM | POA: Diagnosis not present

## 2018-07-07 DIAGNOSIS — E43 Unspecified severe protein-calorie malnutrition: Secondary | ICD-10-CM | POA: Diagnosis not present

## 2018-07-07 DIAGNOSIS — E876 Hypokalemia: Secondary | ICD-10-CM | POA: Diagnosis present

## 2018-07-07 DIAGNOSIS — R4182 Altered mental status, unspecified: Secondary | ICD-10-CM | POA: Diagnosis present

## 2018-07-07 DIAGNOSIS — H409 Unspecified glaucoma: Secondary | ICD-10-CM | POA: Diagnosis present

## 2018-07-07 DIAGNOSIS — A0472 Enterocolitis due to Clostridium difficile, not specified as recurrent: Secondary | ICD-10-CM | POA: Diagnosis not present

## 2018-07-07 DIAGNOSIS — E119 Type 2 diabetes mellitus without complications: Secondary | ICD-10-CM | POA: Diagnosis not present

## 2018-07-07 DIAGNOSIS — I10 Essential (primary) hypertension: Secondary | ICD-10-CM | POA: Diagnosis present

## 2018-07-07 DIAGNOSIS — A414 Sepsis due to anaerobes: Principal | ICD-10-CM | POA: Diagnosis present

## 2018-07-07 DIAGNOSIS — Z6820 Body mass index (BMI) 20.0-20.9, adult: Secondary | ICD-10-CM

## 2018-07-07 DIAGNOSIS — R159 Full incontinence of feces: Secondary | ICD-10-CM | POA: Diagnosis not present

## 2018-07-07 DIAGNOSIS — I110 Hypertensive heart disease with heart failure: Secondary | ICD-10-CM | POA: Diagnosis present

## 2018-07-07 DIAGNOSIS — R8271 Bacteriuria: Secondary | ICD-10-CM | POA: Diagnosis not present

## 2018-07-07 DIAGNOSIS — R5381 Other malaise: Secondary | ICD-10-CM | POA: Diagnosis not present

## 2018-07-07 DIAGNOSIS — E785 Hyperlipidemia, unspecified: Secondary | ICD-10-CM

## 2018-07-07 DIAGNOSIS — A0471 Enterocolitis due to Clostridium difficile, recurrent: Secondary | ICD-10-CM | POA: Diagnosis present

## 2018-07-07 DIAGNOSIS — Z8719 Personal history of other diseases of the digestive system: Secondary | ICD-10-CM | POA: Diagnosis not present

## 2018-07-07 DIAGNOSIS — R7881 Bacteremia: Secondary | ICD-10-CM | POA: Diagnosis not present

## 2018-07-07 DIAGNOSIS — I5032 Chronic diastolic (congestive) heart failure: Secondary | ICD-10-CM | POA: Diagnosis present

## 2018-07-07 DIAGNOSIS — E1151 Type 2 diabetes mellitus with diabetic peripheral angiopathy without gangrene: Secondary | ICD-10-CM | POA: Diagnosis present

## 2018-07-07 DIAGNOSIS — Z833 Family history of diabetes mellitus: Secondary | ICD-10-CM

## 2018-07-07 DIAGNOSIS — Z7982 Long term (current) use of aspirin: Secondary | ICD-10-CM | POA: Diagnosis not present

## 2018-07-07 DIAGNOSIS — I739 Peripheral vascular disease, unspecified: Secondary | ICD-10-CM | POA: Diagnosis present

## 2018-07-07 DIAGNOSIS — Z9049 Acquired absence of other specified parts of digestive tract: Secondary | ICD-10-CM

## 2018-07-07 DIAGNOSIS — E7800 Pure hypercholesterolemia, unspecified: Secondary | ICD-10-CM | POA: Diagnosis present

## 2018-07-07 DIAGNOSIS — Z87891 Personal history of nicotine dependence: Secondary | ICD-10-CM | POA: Diagnosis not present

## 2018-07-07 DIAGNOSIS — L8990 Pressure ulcer of unspecified site, unspecified stage: Secondary | ICD-10-CM

## 2018-07-07 DIAGNOSIS — Z79899 Other long term (current) drug therapy: Secondary | ICD-10-CM

## 2018-07-07 DIAGNOSIS — L89612 Pressure ulcer of right heel, stage 2: Secondary | ICD-10-CM | POA: Diagnosis present

## 2018-07-07 DIAGNOSIS — D72829 Elevated white blood cell count, unspecified: Secondary | ICD-10-CM

## 2018-07-07 LAB — I-STAT CG4 LACTIC ACID, ED: Lactic Acid, Venous: 1.26 mmol/L (ref 0.5–1.9)

## 2018-07-07 LAB — URINALYSIS, ROUTINE W REFLEX MICROSCOPIC
Bacteria, UA: NONE SEEN
Bilirubin Urine: NEGATIVE
Glucose, UA: NEGATIVE mg/dL
Ketones, ur: NEGATIVE mg/dL
Leukocytes, UA: NEGATIVE
Nitrite: NEGATIVE
Protein, ur: 100 mg/dL — AB
Specific Gravity, Urine: 1.013 (ref 1.005–1.030)
pH: 6 (ref 5.0–8.0)

## 2018-07-07 LAB — CBC WITH DIFFERENTIAL/PLATELET
Basophils Absolute: 0 10*3/uL (ref 0.0–0.1)
Basophils Relative: 0 %
Eosinophils Absolute: 0.1 10*3/uL (ref 0.0–0.7)
Eosinophils Relative: 0 %
HCT: 35.4 % — ABNORMAL LOW (ref 39.0–52.0)
Hemoglobin: 11.6 g/dL — ABNORMAL LOW (ref 13.0–17.0)
Lymphocytes Relative: 6 %
Lymphs Abs: 1.6 10*3/uL (ref 0.7–4.0)
MCH: 26.8 pg (ref 26.0–34.0)
MCHC: 32.8 g/dL (ref 30.0–36.0)
MCV: 81.8 fL (ref 78.0–100.0)
Monocytes Absolute: 1.7 10*3/uL — ABNORMAL HIGH (ref 0.1–1.0)
Monocytes Relative: 7 %
Neutro Abs: 21.5 10*3/uL — ABNORMAL HIGH (ref 1.7–7.7)
Neutrophils Relative %: 87 %
Platelets: 301 10*3/uL (ref 150–400)
RBC: 4.33 MIL/uL (ref 4.22–5.81)
RDW: 16.5 % — ABNORMAL HIGH (ref 11.5–15.5)
WBC: 24.9 10*3/uL — ABNORMAL HIGH (ref 4.0–10.5)

## 2018-07-07 LAB — C DIFFICILE QUICK SCREEN W PCR REFLEX
C Diff antigen: POSITIVE — AB
C Diff interpretation: DETECTED
C Diff toxin: POSITIVE — AB

## 2018-07-07 LAB — COMPREHENSIVE METABOLIC PANEL
ALT: 13 U/L (ref 0–44)
AST: 14 U/L — ABNORMAL LOW (ref 15–41)
Albumin: 2.9 g/dL — ABNORMAL LOW (ref 3.5–5.0)
Alkaline Phosphatase: 101 U/L (ref 38–126)
Anion gap: 13 (ref 5–15)
BUN: 9 mg/dL (ref 8–23)
CO2: 28 mmol/L (ref 22–32)
Calcium: 9.1 mg/dL (ref 8.9–10.3)
Chloride: 100 mmol/L (ref 98–111)
Creatinine, Ser: 1.1 mg/dL (ref 0.61–1.24)
GFR calc Af Amer: >60 mL/min (ref >60)
GFR calc non Af Amer: >60 mL/min (ref >60)
Glucose, Bld: 206 mg/dL — ABNORMAL HIGH (ref 70–99)
Potassium: 3 mmol/L — ABNORMAL LOW (ref 3.5–5.1)
Sodium: 141 mmol/L (ref 135–145)
Total Bilirubin: 1 mg/dL (ref 0.3–1.2)
Total Protein: 8 g/dL (ref 6.5–8.1)

## 2018-07-07 LAB — I-STAT TROPONIN, ED: Troponin i, poc: 0.01 ng/mL (ref 0.00–0.08)

## 2018-07-07 LAB — GLUCOSE, CAPILLARY
Glucose-Capillary: 165 mg/dL — ABNORMAL HIGH (ref 70–99)
Glucose-Capillary: 144 mg/dL — ABNORMAL HIGH (ref 70–99)

## 2018-07-07 LAB — CBG MONITORING, ED: Glucose-Capillary: 183 mg/dL — ABNORMAL HIGH (ref 70–99)

## 2018-07-07 LAB — BRAIN NATRIURETIC PEPTIDE: B Natriuretic Peptide: 499.2 pg/mL — ABNORMAL HIGH (ref 0.0–100.0)

## 2018-07-07 MED ORDER — IOPAMIDOL (ISOVUE-300) INJECTION 61%
INTRAVENOUS | Status: AC
  Filled 2018-07-07: qty 100

## 2018-07-07 MED ORDER — TIMOLOL MALEATE 0.5 % OP SOLN
1.0000 [drp] | Freq: Two times a day (BID) | OPHTHALMIC | Status: DC
  Administered 2018-07-07 – 2018-07-11 (×8): 1 [drp] via OPHTHALMIC
  Filled 2018-07-07: qty 5

## 2018-07-07 MED ORDER — SODIUM CHLORIDE 0.9 % IV SOLN
INTRAVENOUS | Status: AC
  Administered 2018-07-07: 18:00:00 via INTRAVENOUS

## 2018-07-07 MED ORDER — POTASSIUM CHLORIDE CRYS ER 20 MEQ PO TBCR
40.0000 meq | EXTENDED_RELEASE_TABLET | Freq: Once | ORAL | Status: DC
  Filled 2018-07-07: qty 2

## 2018-07-07 MED ORDER — LOSARTAN POTASSIUM 50 MG PO TABS: 100.0000 mg | ORAL_TABLET | Freq: Every day | ORAL | Status: DC

## 2018-07-07 MED ORDER — VANCOMYCIN 50 MG/ML ORAL SOLUTION: 125.0000 mg | Freq: Every day | ORAL | Status: DC

## 2018-07-07 MED ORDER — VANCOMYCIN 50 MG/ML ORAL SOLUTION: 125.0000 mg | ORAL | Status: DC

## 2018-07-07 MED ORDER — SIMVASTATIN 40 MG PO TABS
40.0000 mg | ORAL_TABLET | Freq: Every day | ORAL | Status: DC
  Administered 2018-07-07 – 2018-07-11 (×5): 40 mg via ORAL
  Filled 2018-07-07 (×5): qty 1

## 2018-07-07 MED ORDER — POTASSIUM CHLORIDE 20 MEQ/15ML (10%) PO SOLN
40.0000 meq | Freq: Once | ORAL | Status: AC
  Administered 2018-07-07: 40 meq via ORAL
  Filled 2018-07-07: qty 30

## 2018-07-07 MED ORDER — ACETAMINOPHEN 325 MG PO TABS
650.0000 mg | ORAL_TABLET | Freq: Once | ORAL | Status: AC
  Administered 2018-07-07: 650 mg via ORAL
  Filled 2018-07-07: qty 2

## 2018-07-07 MED ORDER — METRONIDAZOLE IN NACL 5-0.79 MG/ML-% IV SOLN
500.0000 mg | Freq: Three times a day (TID) | INTRAVENOUS | Status: DC
  Administered 2018-07-07 – 2018-07-08 (×3): 500 mg via INTRAVENOUS
  Filled 2018-07-07 (×3): qty 100

## 2018-07-07 MED ORDER — SODIUM CHLORIDE 0.9 % IV SOLN
1.0000 g | Freq: Three times a day (TID) | INTRAVENOUS | Status: DC
  Administered 2018-07-07 – 2018-07-08 (×2): 1 g via INTRAVENOUS
  Filled 2018-07-07 (×3): qty 1

## 2018-07-07 MED ORDER — INSULIN ASPART 100 UNIT/ML ~~LOC~~ SOLN
0.0000 [IU] | Freq: Three times a day (TID) | SUBCUTANEOUS | Status: DC
  Administered 2018-07-07: 2 [IU] via SUBCUTANEOUS
  Administered 2018-07-08: 1 [IU] via SUBCUTANEOUS
  Administered 2018-07-08: 2 [IU] via SUBCUTANEOUS
  Administered 2018-07-08: 1 [IU] via SUBCUTANEOUS
  Administered 2018-07-09: 3 [IU] via SUBCUTANEOUS
  Administered 2018-07-10 (×2): 2 [IU] via SUBCUTANEOUS
  Administered 2018-07-11: 1 [IU] via SUBCUTANEOUS

## 2018-07-07 MED ORDER — INSULIN ASPART 100 UNIT/ML ~~LOC~~ SOLN
0.0000 [IU] | Freq: Every day | SUBCUTANEOUS | Status: DC
  Administered 2018-07-09: 2 [IU] via SUBCUTANEOUS

## 2018-07-07 MED ORDER — MEROPENEM 1 G IV SOLR
1.0000 g | Freq: Once | INTRAVENOUS | Status: AC
  Administered 2018-07-07: 1 g via INTRAVENOUS
  Filled 2018-07-07: qty 1

## 2018-07-07 MED ORDER — BRIMONIDINE TARTRATE 0.2 % OP SOLN
1.0000 [drp] | Freq: Two times a day (BID) | OPHTHALMIC | Status: DC
  Administered 2018-07-07 – 2018-07-11 (×8): 1 [drp] via OPHTHALMIC
  Filled 2018-07-07: qty 5

## 2018-07-07 MED ORDER — LOSARTAN POTASSIUM 50 MG PO TABS
100.0000 mg | ORAL_TABLET | Freq: Every day | ORAL | Status: DC
  Administered 2018-07-07 – 2018-07-11 (×5): 100 mg via ORAL
  Filled 2018-07-07 (×5): qty 2

## 2018-07-07 MED ORDER — ENOXAPARIN SODIUM 40 MG/0.4ML ~~LOC~~ SOLN
40.0000 mg | SUBCUTANEOUS | Status: DC
  Administered 2018-07-07 – 2018-07-10 (×4): 40 mg via SUBCUTANEOUS
  Filled 2018-07-07 (×4): qty 0.4

## 2018-07-07 MED ORDER — ACETAMINOPHEN 650 MG RE SUPP
650.0000 mg | Freq: Four times a day (QID) | RECTAL | Status: DC | PRN
Start: 2018-07-07 — End: 2018-07-11

## 2018-07-07 MED ORDER — ONDANSETRON HCL 4 MG/2ML IJ SOLN: 4.0000 mg | Freq: Four times a day (QID) | INTRAMUSCULAR | Status: DC | PRN

## 2018-07-07 MED ORDER — POTASSIUM CHLORIDE 10 MEQ/100ML IV SOLN
10.0000 meq | Freq: Once | INTRAVENOUS | Status: AC
  Administered 2018-07-07: 10 meq via INTRAVENOUS
  Filled 2018-07-07: qty 100

## 2018-07-07 MED ORDER — VANCOMYCIN 50 MG/ML ORAL SOLUTION
125.0000 mg | Freq: Four times a day (QID) | ORAL | Status: DC
  Administered 2018-07-07 – 2018-07-11 (×14): 125 mg via ORAL
  Filled 2018-07-07 (×16): qty 2.5

## 2018-07-07 MED ORDER — IOPAMIDOL (ISOVUE-300) INJECTION 61%
100.0000 mL | Freq: Once | INTRAVENOUS | Status: AC | PRN
  Administered 2018-07-07: 100 mL via INTRAVENOUS

## 2018-07-07 MED ORDER — ONDANSETRON HCL 4 MG PO TABS: 4.0000 mg | ORAL_TABLET | Freq: Four times a day (QID) | ORAL | Status: DC | PRN

## 2018-07-07 MED ORDER — ACETAMINOPHEN 325 MG PO TABS: 650.0000 mg | ORAL_TABLET | Freq: Four times a day (QID) | ORAL | Status: DC | PRN

## 2018-07-07 MED ORDER — VANCOMYCIN 50 MG/ML ORAL SOLUTION: 125.0000 mg | Freq: Two times a day (BID) | ORAL | Status: DC

## 2018-07-07 MED ORDER — ASPIRIN EC 81 MG PO TBEC
81.0000 mg | DELAYED_RELEASE_TABLET | Freq: Every day | ORAL | Status: DC
Start: 2018-07-07 — End: 2018-07-11
  Administered 2018-07-07 – 2018-07-11 (×5): 81 mg via ORAL
  Filled 2018-07-07 (×5): qty 1

## 2018-07-07 MED ORDER — METOPROLOL SUCCINATE ER 50 MG PO TB24
50.0000 mg | ORAL_TABLET | Freq: Every day | ORAL | Status: DC
  Administered 2018-07-07 – 2018-07-09 (×3): 50 mg via ORAL
  Filled 2018-07-07 (×4): qty 1

## 2018-07-07 NOTE — ED Provider Notes (Signed)
[facility]-EMERGENCY DEPT Provider Note   CSN: [id number] Arrival date & time: 07/07/18  [id number]   LEVEL 5 CAVEAT - ALTERED MENTAL STATUS   History   Chief Complaint Chief Complaint  Patient presents with  . Fatigue  . Diarrhea    HPI Jeremiah [name] is a 72 y.o. male.  HPI  72 year old male with history of C. difficile, diastolic heart failure, type 2 diabetes presents with lethargy and diarrhea.  History is taken from the nurse who spoke to EMS but unfortunately the history is limited because the patient is altered.  EMS picked the patient up because the wife stated that he was starting to have diarrhea and fatigue.  Had his JP drain from his prior cholecystectomy removed a couple days ago.  Seems to be getting worse since then.  The patient denies any complaints.  However he is oriented to himself and location but disoriented to time and situation.  Past Medical History:  Diagnosis Date  . Arthritis    "joints; shoulders, knees, hands, back" (05/21/2018)  . C. difficile diarrhea 04/2018  . Diastolic CHF (HCC)   . High cholesterol   . History of gout   . Hypertension   . Peripheral vascular disease (HCC)   . Pneumonia    "couple times" (05/21/2018)  . Sleep apnea    "has mask; won't use" (05/21/2018)  . Type II diabetes mellitus [facility](HCC)     Patient Active Problem List   Diagnosis Date Noted  . Clostridium difficile colitis 07/07/2018  . Hypokalemia 07/07/2018  . HLD (hyperlipidemia) 07/07/2018  . Acute diverticulitis 06/04/2018  . Cholecystitis 05/24/2018  . Cholecystitis with cholelithiasis 05/21/2018  . Acute systolic heart failure (HCC) 03/27/2018  . Diabetes mellitus without complication (HCC) 01/30/2018  . Hypertension 01/30/2018  . Acute cholecystitis 01/30/2018  . AKI (acute kidney injury) (HCC) 01/30/2018  . Normocytic anemia 01/30/2018  . PVD (peripheral vascular disease) (HCC) 09/05/2014    Past Surgical History:  Procedure Laterality  Date  . CATARACT EXTRACTION W/ INTRAOCULAR LENS  IMPLANT, BILATERAL Bilateral   . CHOLECYSTECTOMY  05/21/2018   ATTEMPTED LAPAROSCOPIC CHOLECYSTECTOMY, OPEN DRAINAGE OF GALLBLADDER WITH BIOPSY  . CHOLECYSTECTOMY N/A 05/21/2018   Procedure: ATTEMPTED LAPAROSCOPIC CHOLECYSTECTOMY, OPEN DRAINAGE OF GALLBLADDER WITH BIOPSY;  Surgeon: Griselda Mineroth, Paul III, MD;  Location: [facility] OR;  Service: General;  Laterality: N/A;  . COLONOSCOPY W/ POLYPECTOMY    . IR CATHETER TUBE CHANGE  04/14/2018  . IR CHOLANGIOGRAM EXISTING TUBE  03/17/2018  . IR PERC CHOLECYSTOSTOMY  01/31/2018  . IR RADIOLOGIST EVAL & MGMT  03/02/2018        Home Medications    Prior to Admission medications   Medication Sig Start Date End Date Taking? Authorizing Provider  aspirin 81 MG tablet Take 81 mg by mouth daily.   Yes [provider]  brimonidine (ALPHAGAN) 0.2 % ophthalmic solution Place 1 drop into both eyes 2 (two) times daily.    Yes [provider]  glimepiride (AMARYL) 4 MG tablet Take 1 tablet (4 mg total) by mouth daily with breakfast. Patient taking differently: Take 4 mg by mouth daily.  02/02/18  Yes Buriev, Isaiah SergeUlugbek N, MD  losartan (COZAAR) 100 MG tablet Take 100 mg by mouth daily. 03/01/18  Yes [provider]  metoprolol succinate (TOPROL-XL) 50 MG 24 hr tablet Take 1 tablet (50 mg total) by mouth daily. Take with or immediately following a meal. 04/02/18  Yes Rinaldo CloudHarwani, Mohan, MD  simvastatin (ZOCOR) 40 MG  tablet Take 40 mg by mouth daily.   Yes [provider]  timolol (TIMOPTIC) 0.5 % ophthalmic solution Place 1 drop into both eyes 2 (two) times daily. 01/27/18  Yes [provider]  HYDROcodone-acetaminophen (NORCO/VICODIN) 5-325 MG tablet Take 1-2 tablets by mouth every 6 (six) hours as needed for moderate pain. Patient not taking: Reported on 07/07/2018 05/24/18   Chevis Prettyoth, Paul III, MD  vancomycin (VANCOCIN) 50 mg/mL oral solution Take 2.5 mLs (125 mg total) by mouth 4 (four) times daily  for 12 days, THEN 2.5 mLs (125 mg total) 2 (two) times daily for 7 days, THEN 2.5 mLs (125 mg total) daily for 7 days, THEN 2.5 mLs (125 mg total) every 3 (three) days for 14 days. 06/06/18 07/16/18  Lenox PondsSilva Zapata, Edwin, MD    Family History Family History  Problem Relation Age of Onset  . Diabetes Mother   . Hypertension Mother   . Heart disease Father   . Heart attack Father   . Diabetes Sister   . Hypertension Sister   . Diabetes Brother   . Heart disease Brother   . Hypertension Brother   . Heart attack Brother     Social History Social History   Tobacco Use  . Smoking status: Former Smoker    Years: 24.00    Types: Cigarettes    Last attempt to quit: 11/25/1983    Years since quitting: 34.6  . Smokeless tobacco: Never Used  Substance Use Topics  . Alcohol use: Not Currently  . Drug use: No     Allergies   Patient has no known allergies.   Review of Systems Review of Systems  Unable to perform ROS: Mental status change     Physical Exam Updated Vital Signs BP 116/62   Pulse 84   Temp (!) 101 F (38.3 C) (Rectal)   Resp (!) 21   Ht 5\' 9"  (1.753 m)   Wt 63.5 kg   SpO2 97%   BMI 20.67 kg/m   Physical Exam  Constitutional: He appears well-developed and well-nourished. No distress.  HENT:  Head: Normocephalic and atraumatic.  Right Ear: External ear normal.  Left Ear: External ear normal.  Nose: Nose normal.  Eyes: Right eye exhibits no discharge. Left eye exhibits no discharge.  Neck: Neck supple.  Cardiovascular: Normal rate and regular rhythm.  Murmur heard. Pulmonary/Chest: Effort normal and breath sounds normal.  Abdominal: Soft. He exhibits no distension. There is no tenderness.  RUQ drain site without redness, oozing or tenderness  Musculoskeletal: He exhibits no edema.  Neurological: He is alert.  Awake, alert, oriented to person, place. Disoriented to time, situation. Does not know president  Skin: Skin is warm and dry. He is not  diaphoretic.  Nursing note and vitals reviewed.    ED Treatments / Results  Labs (all labs ordered are listed, but only abnormal results are displayed) Labs Reviewed  COMPREHENSIVE METABOLIC PANEL - Abnormal; Notable for the following components:      Result Value   Potassium 3.0 (*)    Glucose, Bld 206 (*)    Albumin 2.9 (*)    AST 14 (*)    All other components within normal limits  CBC WITH DIFFERENTIAL/PLATELET - Abnormal; Notable for the following components:   WBC 24.9 (*)    Hemoglobin 11.6 (*)    HCT 35.4 (*)    RDW 16.5 (*)    Neutro Abs 21.5 (*)    Monocytes Absolute 1.7 (*)  All other components within normal limits  URINALYSIS, ROUTINE W REFLEX MICROSCOPIC - Abnormal; Notable for the following components:   Hgb urine dipstick SMALL (*)    Protein, ur 100 (*)    All other components within normal limits  BRAIN NATRIURETIC PEPTIDE - Abnormal; Notable for the following components:   B Natriuretic Peptide 499.2 (*)    All other components within normal limits  CBG MONITORING, ED - Abnormal; Notable for the following components:   Glucose-Capillary 183 (*)    All other components within normal limits  CULTURE, BLOOD (ROUTINE X 2)  CULTURE, BLOOD (ROUTINE X 2)  URINE CULTURE  C DIFFICILE QUICK SCREEN W PCR REFLEX  GASTROINTESTINAL PANEL BY PCR, STOOL (REPLACES STOOL CULTURE)  I-STAT CG4 LACTIC ACID, ED  I-STAT TROPONIN, ED    EKG EKG Interpretation  Date/Time:  Wednesday July 07 2018 08:29:36 EDT Ventricular Rate:  85 PR Interval:    QRS Duration: 82 QT Interval:  371 QTC Calculation: 442 R Axis:   81 Text Interpretation:  Sinus rhythm Atrial premature complex Borderline right axis deviation minimal ST elevation inferiorly with PR depression Artifact no significant change since earlier in the day Confirmed by Pricilla Loveless [phone]) on 07/07/2018 8:44:13 AM   Radiology Ct Head Wo Contrast  Result Date: 07/07/2018 CLINICAL DATA:  Altered level of  consciousness EXAM: CT HEAD WITHOUT CONTRAST TECHNIQUE: Contiguous axial images were obtained from the base of the skull through the vertex without intravenous contrast. COMPARISON:  07/17/2016 FINDINGS: Brain: There is atrophy and chronic small vessel disease changes. No acute intracranial abnormality. Specifically, no hemorrhage, hydrocephalus, mass lesion, acute infarction, or significant intracranial injury. Vascular: No hyperdense vessel or unexpected calcification. Skull: No acute calvarial abnormality. Sinuses/Orbits: Visualized paranasal sinuses and mastoids clear. Orbital soft tissues unremarkable. Other: None IMPRESSION: No acute intracranial abnormality. Atrophy, chronic microvascular disease. Electronically Signed   By: Charlett Nose M.D.   On: 07/07/2018 11:00   Ct Abdomen Pelvis W Contrast  Result Date: 07/07/2018 CLINICAL DATA:  72 year old male with continued abdominal pain and diarrhea. History of acute cholecystitis with attempted cholecystectomy. Percutaneous cholecystostomy tube placed and recently removed. EXAM: CT ABDOMEN AND PELVIS WITH CONTRAST TECHNIQUE: Multidetector CT imaging of the abdomen and pelvis was performed using the standard protocol following bolus administration of intravenous contrast. CONTRAST:  ISOVUE-300 IOPAMIDOL (ISOVUE-300) INJECTION 61% COMPARISON:  06/04/2018 and prior CTs FINDINGS: Lower chest: A small to moderate RIGHT pleural effusion and trace LEFT pleural effusion are again noted. Coronary artery calcifications and UPPER limits normal heart size noted. Hepatobiliary: The liver is unremarkable. Ill-defined soft tissue/stranding in the region of the gallbladder again noted. No biliary dilatation. Pancreas: Unremarkable Spleen: Unremarkable Adrenals/Urinary Tract: Kidneys, adrenal glands and bladder are unremarkable. Stomach/Bowel: Possible mild circumferential wall thickening of the descending and sigmoid colon noted and may represent colitis. There is no  evidence of bowel obstruction. There is mild wall enhancement of the appendix which is borderline and size but does not appear significantly changed in size when compared to 01/30/2018 CT. No evidence of abscess. Vascular/Lymphatic: Aortic atherosclerosis. No enlarged abdominal or pelvic lymph nodes. Reproductive: Prostate is unremarkable. Other: A tiny amount of perihepatic fluid is unchanged. No other ascites identified. The percutaneous abdominal catheter has been removed. Catheter tract is identified. Musculoskeletal: No acute or suspicious bony lesions. IMPRESSION: 1. Possible mild circumferential wall thickening of the descending and sigmoid colon-question colitis. No evidence of bowel obstruction. 2. Interval removal of abdominal drainage catheter. No evidence of abscess.  3. Small to moderate RIGHT pleural effusion and trace LEFT pleural effusion again noted. 4. No other significant change. 5. Coronary artery and aortic Atherosclerosis (ICD10-I70.0). Electronically Signed   By: Harmon Pier M.D.   On: 07/07/2018 11:14   Dg Chest Port 1 View  Result Date: 07/07/2018 CLINICAL DATA:  Altered mental status EXAM: PORTABLE CHEST 1 VIEW COMPARISON:  May 18, 2018 FINDINGS: There are small pleural effusions bilaterally with bibasilar atelectasis. There is mild left midlung atelectasis. There is no consolidation. Heart is upper normal in size with pulmonary vascularity normal. No adenopathy. There is aortic atherosclerosis. There is calcification in the proximal humeral diaphysis on the right. IMPRESSION: Small pleural effusions bilaterally, slightly larger on the right than on the left. Bibasilar and left mid lung atelectasis. No consolidation. Stable cardiac silhouette. There is aortic atherosclerosis. There is either a small enchondroma or bone infarct in the proximal right humeral diaphysis, stable. Aortic Atherosclerosis (ICD10-I70.0). Electronically Signed   By: Bretta Bang III M.D.   On: 07/07/2018  08:00    Procedures Procedures (including critical care time)  Medications Ordered in ED Medications  iopamidol (ISOVUE-300) 61 % injection (has no administration in time range)  acetaminophen (TYLENOL) tablet 650 mg (has no administration in time range)    Or  acetaminophen (TYLENOL) suppository 650 mg (has no administration in time range)  iopamidol (ISOVUE-300) 61 % injection 100 mL (100 mLs Intravenous Contrast Given 07/07/18 1030)  potassium chloride 10 mEq in 100 mL IVPB (0 mEq Intravenous Stopped 07/07/18 1219)  potassium chloride 20 MEQ/15ML (10%) solution 40 mEq (40 mEq Oral Given 07/07/18 1111)  meropenem (MERREM) 1 g in sodium chloride 0.9 % 100 mL IVPB (0 g Intravenous Stopped 07/07/18 1219)  acetaminophen (TYLENOL) tablet 650 mg (650 mg Oral Given 07/07/18 1146)     Initial Impression / Assessment and Plan / ED Course  I have reviewed the triage vital signs and the nursing notes.  Pertinent labs & imaging results that were available during my care of the patient were reviewed by me and considered in my medical decision making (see chart for details).  Clinical Course as of Jul 08 1507  Wed Jul 07, 2018  [id number] Son is now at the bedside.  The patient fell a few days ago while with his walker but no known head injury.  The patient has been having some waxing and waning altered mental status and is more confused than typical.  He is also been complaining of some chest pain for the past 3 days but is unable to describe the chest pain.  When asked currently, he denies any active chest pain.   [SG]    Clinical Course User Index [SG] Pricilla Loveless, MD    Patient is awake and alert but confused.  He seems more confused than normal per son at the bedside.  Now sinus tell me that he is had some chest pain and so troponin and ECG is obtained.  ECG shows slight inferior ST elevation that is probably more related to the PR depression seen.  I discussed with his cardiologist, Dr. Sharyn Lull,  who has looked at the ECGs and recommends admission to hospitalist and if there is any other concern for cardiac cause such as elevated troponin or other ECG changes then he can be consulted.  Otherwise, his ECGs appears stable and his initial troponin is negative.  When I asked him now he has no chest pain.  I think his altered mental status is  probably from a UTI as his PCPs office has sent a urine culture from a few days ago that tested positive for ESBL.  Thus he was started on meropenem after discussion with pharmacy.  Oddly his urinalysis is negative here although family states he is not currently on antibiotics and has not been for the last couple days.  He is having some diarrhea and the CT scan does show a little bit of colitis, he will need further GI testing for this.  Admit to the hospitalist service.  Final Clinical Impressions(s) / ED Diagnoses   Final diagnoses:  Altered mental status, unspecified altered mental status type    ED Discharge Orders    None       Pricilla Loveless, MD 07/07/18 [phone]

## 2018-07-07 NOTE — Progress Notes (Signed)
Pharmacy Antibiotic Note  Jeremiah [name] is a 72 y.o. male admitted on 07/07/2018 with recurrent Cdiff and ESBL UTI. Pharmacy has been consulted for meropenem dosing.  Plan:  Meropenem 1g IV q8 hr  Height: 5\' 9"  (175.3 cm) Weight: 153 lb (69.4 kg) IBW/kg (Calculated) : 70.7  Temp (24hrs), Avg:99.7 F (37.6 C), Min:98.6 F (37 C), Max:101 F (38.3 C)  Recent Labs  Lab 07/07/18 0850 07/07/18 0900  WBC 24.9*  --   CREATININE 1.10  --   LATICACIDVEN  --  1.26    Estimated Creatinine Clearance: 57.8 mL/min (by C-G formula based on SCr of 1.1 mg/dL).    No Known Allergies  Antimicrobials this admission: meropenem 8/14 >>  PO vanc/IV Flagyl for Cdiff 8/14 >>   Dose adjustments this admission: n/a  Microbiology results: 8/14 BCx: sent 8/14 UCx: sent 8/8 UCx from PCP office: ESBL citrobacter  8/14 Cdiff: sent  8/14 GI panel: canceled  Thank you for allowing pharmacy to be a part of this patient's care.  Bernadene Personrew Dennise Raabe, PharmD, BCPS [phone] 07/07/2018, 5:48 PM

## 2018-07-07 NOTE — ED Notes (Signed)
This RN was unable to call report on time on this pt due to caring for other critical pts.

## 2018-07-07 NOTE — Consult Note (Signed)
[facility]  Total days of antibiotics 1        Day 1 meropenem        Day 1 oral vanco               Reason for Consult: recurrent cdiff   Referring Physician: sheikh  Active Problems:   PVD (peripheral vascular disease) ([place])   Diabetes mellitus without complication ([place])   Hypertension   Normocytic anemia   Clostridium difficile colitis   Hypokalemia   HLD (hyperlipidemia)    HPI: Jeremiah [name] is a 72 y.o. male with recurrent cdifficile. 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 His course complicated by cdifficile including recently hospitalized in mid July, and finishing an oral vancomycin taper recently. He had his chole drained removed the day prior to admit but then started to have watery diarrhea over night. The morning of admit, he was febrile, with rigors, poor oral intake x 2, found to have leukocytosis of 24K, left shift, looks fatigue, weak. He denies abdominal cramping but does have perfuse diarrhea. His pcp collected ua that showed esbl pathogen, thus meropenem was started. It is unclear if he complained of any dysuria per his family report.  Past Medical History:  Diagnosis Date  . Arthritis    "joints; shoulders, knees, hands, back" (05/21/2018)  . C. difficile diarrhea 04/2018  . Diastolic CHF ([place])   . High cholesterol   . History of gout   . Hypertension   . Peripheral vascular disease ([place])   . Pneumonia    "couple times" (05/21/2018)  . Sleep apnea    "has mask; won't use" (05/21/2018)  . Type II diabetes mellitus ([facility])     Allergies: No Known Allergies   MEDICATIONS: . aspirin EC  81 mg Oral Daily  . brimonidine  1 drop Both Eyes BID  . enoxaparin (LOVENOX) injection  40 mg Subcutaneous Q24H  . insulin aspart  0-5 Units Subcutaneous QHS  . insulin aspart   0-9 Units Subcutaneous TID WC  . iopamidol      . losartan  100 mg Oral Daily  . metoprolol succinate  50 mg Oral Daily  . simvastatin  40 mg Oral Daily  . timolol  1 drop Both Eyes BID  . vancomycin  125 mg Oral QID   Followed by  . [START ON 07/21/2018] vancomycin  125 mg Oral BID   Followed by  . [START ON 07/29/2018] vancomycin  125 mg Oral Daily   Followed by  . [START ON 08/05/2018] vancomycin  125 mg Oral QODAY   Followed by  . [START ON 09/02/2018] vancomycin  125 mg Oral Q3 days    Social History   Tobacco Use  . Smoking status: Former Smoker    Years: 24.00    Types: Cigarettes    Last attempt to quit: 11/25/1983    Years since quitting: 34.6  . Smokeless tobacco: Never Used  Substance Use Topics  . Alcohol use: Not Currently  . Drug use: No    Family History  Problem Relation Age of Onset  . Diabetes Mother   . Hypertension Mother   . Heart disease Father   . Heart attack Father   . Diabetes Sister   . Hypertension Sister   . Diabetes Brother   . Heart disease Brother   .  Hypertension Brother   . Heart attack Brother     Review of Systems  Constitutional: positive for fever, chills, diaphoresis, activity change, appetite change, fatigue and unexpected weight change.  HENT: Negative for congestion, sore throat, rhinorrhea, sneezing, trouble swallowing and sinus pressure.  Eyes: Negative for photophobia and visual disturbance.  Respiratory: Negative for cough, chest tightness, shortness of breath, wheezing and stridor.  Cardiovascular: Negative for chest pain, palpitations and leg swelling.  Gastrointestinal: positive for nausea, vomiting, abdominal pain, diarrhea, constipation, blood in stool, abdominal distention and anal bleeding.  Genitourinary: Negative for dysuria, hematuria, flank pain and difficulty urinating.  Musculoskeletal: Negative for myalgias, back pain, joint swelling, arthralgias and gait problem.  Skin: Negative for color change, pallor,  rash and wound.  Neurological: Negative for dizziness, tremors, weakness and light-headedness.  Hematological: Negative for adenopathy. Does not bruise/bleed easily.  Psychiatric/Behavioral: Negative for behavioral problems, confusion, sleep disturbance, dysphoric mood, decreased concentration and agitation.     OBJECTIVE: Temp:  [98.6 F (37 C)-101 F (38.3 C)] 98.6 F (37 C) (08/14 1607) Pulse Rate:  [75-86] 85 (08/14 1607) Resp:  [14-21] 18 (08/14 1607) BP: (116-140)/(60-85) 130/73 (08/14 1607) SpO2:  [94 %-99 %] 95 % (08/14 1607) Weight:  [63.5 kg-69.4 kg] 69.4 kg (08/14 1609) Physical Exam  Constitutional: He is oriented to person, place, and time. He appears well-developed and well-nourished. No distress.  HENT:  Mouth/Throat: Oropharynx is clear and moist. No oropharyngeal exudate.  Cardiovascular: Normal rate, regular rhythm and normal heart sounds. Exam reveals no gallop and no friction rub.  No murmur heard.  Pulmonary/Chest: Effort normal and breath sounds normal. No respiratory distress. He has no wheezes.  Abdominal: Soft. Bowel sounds are normal. He exhibits no distension. There is no tenderness.  Lymphadenopathy:  He has no cervical adenopathy.  Neurological: He is alert and oriented to person, place, and time.  Skin: Skin is warm and dry. No rash noted. No erythema.  Psychiatric: He has a normal mood and affect. His behavior is normal.     LABS: Results for orders placed or performed during the hospital encounter of 07/07/18 (from the past 48 hour(s))  Urinalysis, Routine w reflex microscopic     Status: Abnormal   Collection Time: 07/07/18  7:46 AM  Result Value Ref Range   Color, Urine YELLOW YELLOW   APPearance CLEAR CLEAR   Specific Gravity, Urine 1.013 1.005 - 1.030   pH 6.0 5.0 - 8.0   Glucose, UA NEGATIVE NEGATIVE mg/dL   Hgb urine dipstick SMALL (A) NEGATIVE   Bilirubin Urine NEGATIVE NEGATIVE   Ketones, ur NEGATIVE NEGATIVE mg/dL   Protein, ur  100 (A) NEGATIVE mg/dL   Nitrite NEGATIVE NEGATIVE   Leukocytes, UA NEGATIVE NEGATIVE   RBC / HPF 11-20 0 - 5 RBC/hpf   WBC, UA 0-5 0 - 5 WBC/hpf   Bacteria, UA NONE SEEN NONE SEEN   Mucus PRESENT     Comment: Performed at [facility], [place] [address] [place], [place] [address]  CBG monitoring, ED     Status: Abnormal   Collection Time: 07/07/18  7:51 AM  Result Value Ref Range   Glucose-Capillary 183 (H) 70 - 99 mg/dL  Comprehensive metabolic panel     Status: Abnormal   Collection Time: 07/07/18  8:50 AM  Result Value Ref Range   Sodium 141 135 - 145 mmol/L   Potassium 3.0 (L) 3.5 - 5.1 mmol/L   Chloride 100 98 - 111 mmol/L   CO2  28 22 - 32 mmol/L   Glucose, Bld 206 (H) 70 - 99 mg/dL   BUN 9 8 - 23 mg/dL   Creatinine, Ser 1.10 0.61 - 1.24 mg/dL   Calcium 9.1 8.9 - 10.3 mg/dL   Total Protein 8.0 6.5 - 8.1 g/dL   Albumin 2.9 (L) 3.5 - 5.0 g/dL   AST 14 (L) 15 - 41 U/L   ALT 13 0 - 44 U/L   Alkaline Phosphatase 101 38 - 126 U/L   Total Bilirubin 1.0 0.3 - 1.2 mg/dL   GFR calc non Af Amer >60 >60 mL/min   GFR calc Af Amer >60 >60 mL/min    Comment: (NOTE) The eGFR has been calculated using the CKD EPI equation. This calculation has not been validated in all clinical situations. eGFR's persistently <60 mL/min signify possible Chronic Kidney Disease.    Anion gap 13 5 - 15    Comment: Performed at [facility], [place] [address] [place], [place] [address]  CBC WITH DIFFERENTIAL     Status: Abnormal   Collection Time: 07/07/18  8:50 AM  Result Value Ref Range   WBC 24.9 (H) 4.0 - 10.5 K/uL   RBC 4.33 4.22 - 5.81 MIL/uL   Hemoglobin 11.6 (L) 13.0 - 17.0 g/dL   HCT 35.4 (L) 39.0 - 52.0 %   MCV 81.8 78.0 - 100.0 fL   MCH 26.8 26.0 - 34.0 pg   MCHC 32.8 30.0 - 36.0 g/dL   RDW 16.5 (H) 11.5 - 15.5 %   Platelets 301 150 - 400 K/uL   Neutrophils Relative % 87 %   Neutro Abs 21.5 (H) 1.7 - 7.7 K/uL   Lymphocytes Relative 6 %   Lymphs Abs  1.6 0.7 - 4.0 K/uL   Monocytes Relative 7 %   Monocytes Absolute 1.7 (H) 0.1 - 1.0 K/uL   Eosinophils Relative 0 %   Eosinophils Absolute 0.1 0.0 - 0.7 K/uL   Basophils Relative 0 %   Basophils Absolute 0.0 0.0 - 0.1 K/uL    Comment: Performed at [facility], [place] [address], [place], [place] [address]  Blood Culture (routine x 2)     Status: None (Preliminary result)   Collection Time: 07/07/18  8:50 AM  Result Value Ref Range   Specimen Description      BLOOD LEFT ANTECUBITAL Performed at [facility] [address] [place], [place] [address]    Special Requests      BOTTLES DRAWN AEROBIC ONLY Blood Culture adequate volume Performed at [facility] [address] [place], [place] [address]    Culture PENDING    Report Status PENDING   Brain natriuretic peptide     Status: Abnormal   Collection Time: 07/07/18  8:50 AM  Result Value Ref Range   B Natriuretic Peptide 499.2 (H) 0.0 - 100.0 pg/mL    Comment: Performed at [facility], [place] [address], [place], [place] [address]  I-stat troponin, ED     Status: None   Collection Time: 07/07/18  8:58 AM  Result Value Ref Range   Troponin i, poc 0.01 0.00 - 0.08 ng/mL   Comment 3            Comment: Due to the release kinetics of cTnI, a negative result within the first hours of the onset of symptoms does not rule out myocardial infarction with certainty. If myocardial infarction is still suspected, repeat the test at appropriate  intervals.   I-Stat CG4 Lactic Acid, ED  (not at  [facility])     Status: None   Collection Time: 07/07/18  9:00 AM  Result Value Ref Range   Lactic Acid, Venous 1.26 0.5 - 1.9 mmol/L  Glucose, capillary     Status: Abnormal   Collection Time: 07/07/18  3:44 PM  Result Value Ref Range   Glucose-Capillary 165 (H) 70 - 99 mg/dL    MICRO:  IMAGING: Ct Head Wo Contrast  Result Date: 07/07/2018 CLINICAL DATA:  Altered level of consciousness  EXAM: CT HEAD WITHOUT CONTRAST TECHNIQUE: Contiguous axial images were obtained from the base of the skull through the vertex without intravenous contrast. COMPARISON:  07/17/2016 FINDINGS: Brain: There is atrophy and chronic small vessel disease changes. No acute intracranial abnormality. Specifically, no hemorrhage, hydrocephalus, mass lesion, acute infarction, or significant intracranial injury. Vascular: No hyperdense vessel or unexpected calcification. Skull: No acute calvarial abnormality. Sinuses/Orbits: Visualized paranasal sinuses and mastoids clear. Orbital soft tissues unremarkable. Other: None IMPRESSION: No acute intracranial abnormality. Atrophy, chronic microvascular disease. Electronically Signed   By: Rolm Baptise M.D.   On: 07/07/2018 11:00   Ct Abdomen Pelvis W Contrast  Result Date: 07/07/2018 CLINICAL DATA:  72 year old male with continued abdominal pain and diarrhea. History of acute cholecystitis with attempted cholecystectomy. Percutaneous cholecystostomy tube placed and recently removed. EXAM: CT ABDOMEN AND PELVIS WITH CONTRAST TECHNIQUE: Multidetector CT imaging of the abdomen and pelvis was performed using the standard protocol following bolus administration of intravenous contrast. CONTRAST:  190m ISOVUE-300 IOPAMIDOL (ISOVUE-300) INJECTION 61% COMPARISON:  06/04/2018 and prior CTs FINDINGS: Lower chest: A small to moderate RIGHT pleural effusion and trace LEFT pleural effusion are again noted. Coronary artery calcifications and UPPER limits normal heart size noted. Hepatobiliary: The liver is unremarkable. Ill-defined soft tissue/stranding in the region of the gallbladder again noted. No biliary dilatation. Pancreas: Unremarkable Spleen: Unremarkable Adrenals/Urinary Tract: Kidneys, adrenal glands and bladder are unremarkable. Stomach/Bowel: Possible mild circumferential wall thickening of the descending and sigmoid colon noted and may represent colitis. There is no evidence of  bowel obstruction. There is mild wall enhancement of the appendix which is borderline and size but does not appear significantly changed in size when compared to 01/30/2018 CT. No evidence of abscess. Vascular/Lymphatic: Aortic atherosclerosis. No enlarged abdominal or pelvic lymph nodes. Reproductive: Prostate is unremarkable. Other: A tiny amount of perihepatic fluid is unchanged. No other ascites identified. The percutaneous abdominal catheter has been removed. Catheter tract is identified. Musculoskeletal: No acute or suspicious bony lesions. IMPRESSION: 1. Possible mild circumferential wall thickening of the descending and sigmoid colon-question colitis. No evidence of bowel obstruction. 2. Interval removal of abdominal drainage catheter. No evidence of abscess. 3. Small to moderate RIGHT pleural effusion and trace LEFT pleural effusion again noted. 4. No other significant change. 5. Coronary artery and aortic Atherosclerosis (ICD10-I70.0). Electronically Signed   By: JMargarette CanadaM.D.   On: 07/07/2018 11:14   Dg Chest Port 1 View  Result Date: 07/07/2018 CLINICAL DATA:  Altered mental status EXAM: PORTABLE CHEST 1 VIEW COMPARISON:  May 18, 2018 FINDINGS: There are small pleural effusions bilaterally with bibasilar atelectasis. There is mild left midlung atelectasis. There is no consolidation. Heart is upper normal in size with pulmonary vascularity normal. No adenopathy. There is aortic atherosclerosis. There is calcification in the proximal humeral diaphysis on the right. IMPRESSION: Small pleural effusions bilaterally, slightly larger on the right than on the left. Bibasilar and left mid lung atelectasis. No consolidation.  Stable cardiac silhouette. There is aortic atherosclerosis. There is either a small enchondroma or bone infarct in the proximal right humeral diaphysis, stable. Aortic Atherosclerosis (ICD10-I70.0). Electronically Signed   By: Lowella Grip III M.D.   On: 07/07/2018 08:00     Assessment/Plan:  72yo M with recurrent cdifficile  Will start oral IV metronidazole plus oral vancomycin. He appears to have poor po intake, nausea. Will see he improves overnight.cdifficile pending  uti = it is unclear if he has uti since he has AMS. For now will continue meropenem overnight and decide if need to continue. Concerning that he had quick onset of fever and chills which can be more common with acute gram negative infection  Deconditioning = plan to work with pt/ot  Severe protein-caloric malnutrition = have patient be seen my nutritionist

## 2018-07-07 NOTE — Progress Notes (Signed)
CRITICAL VALUE ALERT  Critical Value:  + C-Diff  Date & Time Notied:  07/07/18, 1840  Provider Notified: Marland McalpineSheikh  Orders Received/Actions taken: N/A

## 2018-07-07 NOTE — Progress Notes (Signed)
A consult was received from an ED physician for meropenem per pharmacy dosing.  The patient's profile has been reviewed for ht/wt/allergies/indication/available labs.   A one time order has been placed for meropenem 1g.    Further antibiotics/pharmacy consults should be ordered by admitting physician if indicated.                       Thank you, Loralee PacasErin Bexley Mclester, PharmD, BCPS 07/07/2018  11:17 AM

## 2018-07-07 NOTE — H&P (Signed)
History and Physical    BABYBOY Jeremiah [name]:[id number] DOB: 07-04-1946 DOA: 07/07/2018  PCP: Rinaldo Cloud, MD   Patient coming from: Home  Chief Complaint: Diarrhea and Confusion  HPI: Jeremiah [name] is a 72 y.o. male with medical history significant of complicated cholecystectomy status post drain placement, history of gout, history of hypertension, peripheral vascular disease, history of recurrent C. difficile colitis recently treated last month, sleep apnea, diabetes mellitus type 2, and other comorbidities who presented to [facility] with a cc of significant diarrhea and confusion per wife.  Patient is confused at this time and is subjective history not able to be obtained from him so most of the history is obtained from the wife and family at bedside.  Patient's wife states that he was doing well up until his drain removal a few days ago.  After drain removal patient, developed significant diarrhea again which was very foul-smelling and liquidy.  Per wife she declined to walk multiple times last night.  Patient then started becoming confused and because of his confusion had a difficult time communicating with his wife and would not verbalize because of his confusion and waxing and waning mental status.  Wife states that he has been more fatigued recently and has gotten worse since having a JP drain from his prior cholecystectomy removed a couple days ago.  Per report patient also had a urinalysis done at his PCPs office which a urine culture was sent and tested positive for Citrobacter koseri .  Patient was started on meropenem by the EDP.  Wife states that he has been doing okay but not eating very well and was concerned that he had all his loose diarrhea.  [facility] was called to admit this patient for likely recurrent C. difficile colitis and infectious disease Dr. Marcha Dutton was consulted for further evaluation recommendations.  ED Course: Had basic blood work done along with imaging studies including a  CT of the abdomen and pelvis along with a CT of the head.  Aminofen and potassium chloride as well as being started on meropenem.  Review of Systems: As per HPI otherwise 10 point review of systems negative.   Past Medical History:  Diagnosis Date  . Arthritis    "joints; shoulders, knees, hands, back" (05/21/2018)  . C. difficile diarrhea 04/2018  . Diastolic CHF (HCC)   . High cholesterol   . History of gout   . Hypertension   . Peripheral vascular disease (HCC)   . Pneumonia    "couple times" (05/21/2018)  . Sleep apnea    "has mask; won't use" (05/21/2018)  . Type II diabetes mellitus (HCC)    Past Surgical History:  Procedure Laterality Date  . CATARACT EXTRACTION W/ INTRAOCULAR LENS  IMPLANT, BILATERAL Bilateral   . CHOLECYSTECTOMY  05/21/2018   ATTEMPTED LAPAROSCOPIC CHOLECYSTECTOMY, OPEN DRAINAGE OF GALLBLADDER WITH BIOPSY  . CHOLECYSTECTOMY N/A 05/21/2018   Procedure: ATTEMPTED LAPAROSCOPIC CHOLECYSTECTOMY, OPEN DRAINAGE OF GALLBLADDER WITH BIOPSY;  Surgeon: Griselda Miner, MD;  Location: [facility] OR;  Service: General;  Laterality: N/A;  . COLONOSCOPY W/ POLYPECTOMY    . IR CATHETER TUBE CHANGE  04/14/2018  . IR CHOLANGIOGRAM EXISTING TUBE  03/17/2018  . IR PERC CHOLECYSTOSTOMY  01/31/2018  . IR RADIOLOGIST EVAL & MGMT  03/02/2018   SOCIAL HISTORY  reports that he quit smoking about 34 years ago. His smoking use included cigarettes. He quit after 24.00 years of use. He has never used smokeless tobacco. He reports that he drank alcohol. He  reports that he does not use drugs.  ALLERGIES No Known Allergies  Family History  Problem Relation Age of Onset  . Diabetes Mother   . Hypertension Mother   . Heart disease Father   . Heart attack Father   . Diabetes Sister   . Hypertension Sister   . Diabetes Brother   . Heart disease Brother   . Hypertension Brother   . Heart attack Brother    Prior to Admission medications   Medication Sig Start Date End Date Taking? Authorizing  Provider  aspirin 81 MG tablet Take 81 mg by mouth daily.   Yes [provider]  brimonidine (ALPHAGAN) 0.2 % ophthalmic solution Place 1 drop into both eyes 2 (two) times daily.    Yes [provider]  glimepiride (AMARYL) 4 MG tablet Take 1 tablet (4 mg total) by mouth daily with breakfast. Patient taking differently: Take 4 mg by mouth daily.  02/02/18  Yes Buriev, Isaiah Serge, MD  losartan (COZAAR) 100 MG tablet Take 100 mg by mouth daily. 03/01/18  Yes [provider]  metoprolol succinate (TOPROL-XL) 50 MG 24 hr tablet Take 1 tablet (50 mg total) by mouth daily. Take with or immediately following a meal. 04/02/18  Yes Rinaldo Cloud, MD  simvastatin (ZOCOR) 40 MG tablet Take 40 mg by mouth daily.   Yes [provider]  timolol (TIMOPTIC) 0.5 % ophthalmic solution Place 1 drop into both eyes 2 (two) times daily. 01/27/18  Yes [provider]  HYDROcodone-acetaminophen (NORCO/VICODIN) 5-325 MG tablet Take 1-2 tablets by mouth every 6 (six) hours as needed for moderate pain. Patient not taking: Reported on 07/07/2018 05/24/18   Chevis Pretty III, MD  vancomycin (VANCOCIN) 50 mg/mL oral solution Take 2.5 mLs (125 mg total) by mouth 4 (four) times daily for 12 days, THEN 2.5 mLs (125 mg total) 2 (two) times daily for 7 days, THEN 2.5 mLs (125 mg total) daily for 7 days, THEN 2.5 mLs (125 mg total) every 3 (three) days for 14 days. 06/06/18 07/16/18  Lenox Ponds, MD   Physical Exam: Vitals:   07/07/18 4098 07/07/18 0732 07/07/18 0746 07/07/18 1104  BP:      Pulse:      Resp:      Temp:   100.3 F (37.9 C) (!) 101 F (38.3 C)  TempSrc:   Rectal Rectal  SpO2: 97%     Weight: 63.5 kg     Height: 5\' 7"  (1.702 m) 5\' 9"  (1.753 m)     Constitutional: WN/WD thin AAM in NAD and appears calm  Eyes: Lids and conjunctivae normal, sclerae anicteric  ENMT: External Ears, Nose appear normal. Grossly normal hearing. Mucous membranes are dry slightly  Neck: Appears  normal, supple, no cervical masses, normal ROM, no appreciable thyromegaly, no JVD Respiratory: Diminished to auscultation bilaterally, no wheezing, rales, rhonchi or crackles. Normal respiratory effort and patient is not tachypenic. No accessory muscle use.  Cardiovascular: RRR, Has a slight murmur. S1 and S2 auscultated. No extremity edema.   Abdomen: Soft, non-tender, slightly distended. No masses palpated. No appreciable hepatosplenomegaly. Bowel sounds positive x4. Abdominal Incision where drain was appeared C/D/I  GU: Deferred. Musculoskeletal: No clubbing / cyanosis of digits/nails. No joint deformity upper and lower extremities. Skin: No rashes, lesions, ulcers on a limited skin evaluation. No induration; Warm and dry.  Neurologic: Patient was confused but did not speak much. Romberg sign and cerebellar reflexes not assessed.  Psychiatric: Impaired judgment and insight. Alert  and awake but not oriented. Normal mood and appropriate affect.   Labs on Admission: I have personally reviewed following labs and imaging studies  CBC: Recent Labs  Lab 07/07/18 0850  WBC 24.9*  NEUTROABS 21.5*  HGB 11.6*  HCT 35.4*  MCV 81.8  PLT 301   Basic Metabolic Panel: Recent Labs  Lab 07/07/18 0850  NA 141  K 3.0*  CL 100  CO2 28  GLUCOSE 206*  BUN 9  CREATININE 1.10  CALCIUM 9.1   GFR: Estimated Creatinine Clearance: 52.9 mL/min (by C-G formula based on SCr of 1.1 mg/dL). Liver Function Tests: Recent Labs  Lab 07/07/18 0850  AST 14*  ALT 13  ALKPHOS 101  BILITOT 1.0  PROT 8.0  ALBUMIN 2.9*   No results for input(s): LIPASE, AMYLASE in the last 168 hours. No results for input(s): AMMONIA in the last 168 hours. Coagulation Profile: No results for input(s): INR, PROTIME in the last 168 hours. Cardiac Enzymes: No results for input(s): CKTOTAL, CKMB, CKMBINDEX, TROPONINI in the last 168 hours. BNP (last 3 results) No results for input(s): PROBNP in the last 8760  hours. HbA1C: No results for input(s): HGBA1C in the last 72 hours. CBG: Recent Labs  Lab 07/07/18 0751  GLUCAP 183*   Lipid Profile: No results for input(s): CHOL, HDL, LDLCALC, TRIG, CHOLHDL, LDLDIRECT in the last 72 hours. Thyroid Function Tests: No results for input(s): TSH, T4TOTAL, FREET4, T3FREE, THYROIDAB in the last 72 hours. Anemia Panel: No results for input(s): VITAMINB12, FOLATE, FERRITIN, TIBC, IRON, RETICCTPCT in the last 72 hours. Urine analysis:    Component Value Date/Time   COLORURINE YELLOW 07/07/2018 0746   APPEARANCEUR CLEAR 07/07/2018 0746   LABSPEC 1.013 07/07/2018 0746   PHURINE 6.0 07/07/2018 0746   GLUCOSEU NEGATIVE 07/07/2018 0746   HGBUR SMALL (A) 07/07/2018 0746   BILIRUBINUR NEGATIVE 07/07/2018 0746   KETONESUR NEGATIVE 07/07/2018 0746   PROTEINUR 100 (A) 07/07/2018 0746   UROBILINOGEN 0.2 01/09/2009 1532   NITRITE NEGATIVE 07/07/2018 0746   LEUKOCYTESUR NEGATIVE 07/07/2018 0746   Sepsis Labs: !!!!!!!!!!!!!!!!!!!!!!!!!!!!!!!!!!!!!!!!!!!! @LABRCNTIP (procalcitonin:4,lacticidven:4) ) Recent Results (from the past 240 hour(s))  Blood Culture (routine x 2)     Status: None (Preliminary result)   Collection Time: 07/07/18  8:50 AM  Result Value Ref Range Status   Specimen Description   Final    BLOOD LEFT ANTECUBITAL Performed at [facility], [address] [place], [place] [address]    Special Requests   Final    BOTTLES DRAWN AEROBIC ONLY Blood Culture adequate volume Performed at [facility], [address] [place], [place] [address]    Culture PENDING  Incomplete   Report Status PENDING  Incomplete    Radiological Exams on Admission: Ct Head Wo Contrast  Result Date: 07/07/2018 CLINICAL DATA:  Altered level of consciousness EXAM: CT HEAD WITHOUT CONTRAST TECHNIQUE: Contiguous axial images were obtained from the base of the skull through the vertex without intravenous contrast. COMPARISON:  07/17/2016  FINDINGS: Brain: There is atrophy and chronic small vessel disease changes. No acute intracranial abnormality. Specifically, no hemorrhage, hydrocephalus, mass lesion, acute infarction, or significant intracranial injury. Vascular: No hyperdense vessel or unexpected calcification. Skull: No acute calvarial abnormality. Sinuses/Orbits: Visualized paranasal sinuses and mastoids clear. Orbital soft tissues unremarkable. Other: None IMPRESSION: No acute intracranial abnormality. Atrophy, chronic microvascular disease. Electronically Signed   By: Charlett NoseKevin  Dover M.D.   On: 07/07/2018 11:00   Ct Abdomen Pelvis W Contrast  Result Date: 07/07/2018  CLINICAL DATA:  72 year old male with continued abdominal pain and diarrhea. History of acute cholecystitis with attempted cholecystectomy. Percutaneous cholecystostomy tube placed and recently removed. EXAM: CT ABDOMEN AND PELVIS WITH CONTRAST TECHNIQUE: Multidetector CT imaging of the abdomen and pelvis was performed using the standard protocol following bolus administration of intravenous contrast. CONTRAST:  100mL ISOVUE-300 IOPAMIDOL (ISOVUE-300) INJECTION 61% COMPARISON:  06/04/2018 and prior CTs FINDINGS: Lower chest: A small to moderate RIGHT pleural effusion and trace LEFT pleural effusion are again noted. Coronary artery calcifications and UPPER limits normal heart size noted. Hepatobiliary: The liver is unremarkable. Ill-defined soft tissue/stranding in the region of the gallbladder again noted. No biliary dilatation. Pancreas: Unremarkable Spleen: Unremarkable Adrenals/Urinary Tract: Kidneys, adrenal glands and bladder are unremarkable. Stomach/Bowel: Possible mild circumferential wall thickening of the descending and sigmoid colon noted and may represent colitis. There is no evidence of bowel obstruction. There is mild wall enhancement of the appendix which is borderline and size but does not appear significantly changed in size when compared to 01/30/2018 CT. No  evidence of abscess. Vascular/Lymphatic: Aortic atherosclerosis. No enlarged abdominal or pelvic lymph nodes. Reproductive: Prostate is unremarkable. Other: A tiny amount of perihepatic fluid is unchanged. No other ascites identified. The percutaneous abdominal catheter has been removed. Catheter tract is identified. Musculoskeletal: No acute or suspicious bony lesions. IMPRESSION: 1. Possible mild circumferential wall thickening of the descending and sigmoid colon-question colitis. No evidence of bowel obstruction. 2. Interval removal of abdominal drainage catheter. No evidence of abscess. 3. Small to moderate RIGHT pleural effusion and trace LEFT pleural effusion again noted. 4. No other significant change. 5. Coronary artery and aortic Atherosclerosis (ICD10-I70.0). Electronically Signed   By: Harmon PierJeffrey  Hu M.D.   On: 07/07/2018 11:14   Dg Chest Port 1 View  Result Date: 07/07/2018 CLINICAL DATA:  Altered mental status EXAM: PORTABLE CHEST 1 VIEW COMPARISON:  May 18, 2018 FINDINGS: There are small pleural effusions bilaterally with bibasilar atelectasis. There is mild left midlung atelectasis. There is no consolidation. Heart is upper normal in size with pulmonary vascularity normal. No adenopathy. There is aortic atherosclerosis. There is calcification in the proximal humeral diaphysis on the right. IMPRESSION: Small pleural effusions bilaterally, slightly larger on the right than on the left. Bibasilar and left mid lung atelectasis. No consolidation. Stable cardiac silhouette. There is aortic atherosclerosis. There is either a small enchondroma or bone infarct in the proximal right humeral diaphysis, stable. Aortic Atherosclerosis (ICD10-I70.0). Electronically Signed   By: Bretta BangWilliam  Woodruff III M.D.   On: 07/07/2018 08:00   EKG: Independently reviewed. Showed a Sinus Rhythm with some ST Elevation in the Inferior Leads; EDP discussed case with Cardiology Dr. Sharyn LullHarwani who stated it was non-specific and was  not concerned of ACS.   Assessment/Plan Active Problems:   Clostridium difficile colitis  Suspected recurrent C. difficile Colitis -Admit to inpatient telemetry -Patient's wife states that he had several bowel movements.  Recently treated for C. difficile colitis and discharged 06/06/2018 on vancomycin -Patient recently had drain removed from his cholecystectomy earlier this week -Temperature on admission was 101 and WBC was 24.9 -Patient had a urine culture done at his PCP office which showed urinalysis and Citrobacter koseri greater than 100,000 colony-forming units however presentation is likely consistent with C. difficile caused colitis -Patient was started on meropenem in the ED for suspected ESBL UTI and will continue based on infectious disease recommendations until okay to be stopped -CT of abdomen pelvis showed possible mid circumferential wall thickening of the descending  and sigmoid colon but no evidence of any bowel obstruction -Discussed the case with infectious disease to further evaluate and see the patient but recommended at this time to place the patient on just vancomycin p.o.  Acute Encephalopathy -Head CT showed No acute intracranial abnormality. Atrophy, chronic microvascular disease. -Likely 2/2 to suspected C Difficile versus possible UTI -Expect to Improve once Infection is being treated -NPO until Speech Evaluates   Suspected UTI -Urinalysis was abnormal at PCPs office and urine culture grew out Citrobacter Koseri >100,000 CFU -Repeat Urinalysis and Urine Cx while hospitalized -ID consulted and appreciate further evaluation and recommendations -For now Continue IV Meropenem per ID recommendations   Hypokalemia -[place] secondary to diarrhea -Patient's potassium this morning was 3.0 -Replete with IV potassium chloride -Continue to monitor and replete as necessary -Repeat CMP in a.m.  Diabetes mellitus type II -Hold home glimepiride and placed on sliding  scale insulin -Continue to monitor CBGs  Glaucoma -Continue brimonidine, along with timolol  Hyperlipidemia -Continue with simvastatin 40 g p.o. nightly  History of diastolic CHF -Continue with metoprolol and losartan -BNP was elevated to 499 -Currently not decompensated on exacerbation -Start gentle IV fluid hydration with normal saline given his diarrhea for 16 hours -Continue to Monitor Volume Status extremely carefully   Peripheral vascular disease -Continue with aspirin 81 mg and statin  Normocytic Anemia -Patient's Hb/Hct was 11.6/35.5 -Continue to Monitor for S/Sx of Bleeding -Repeat CBC in AM   DVT prophylaxis: Enoxaparin 40 mg sq q24h Code Status: FULL CODE Family Communication: Discussed with wife and Son at bedside  Disposition Plan: Anticipate D/C in next 48-72 hours and likely to SNF Consults called:  Infectious Diseases Dr. Drue Second; Patient's PCP and Cardiologist was called by EDP Admission status: Inpatient Telemetry   Severity of Illness: The appropriate patient status for this patient is INPATIENT. Inpatient status is judged to be reasonable and necessary in order to provide the required intensity of service to ensure the patient's safety. The patient's presenting symptoms, physical exam findings, and initial radiographic and laboratory data in the context of their chronic comorbidities is felt to place them at high risk for further clinical deterioration. Furthermore, it is not anticipated that the patient will be medically stable for discharge from the hospital within 2 midnights of admission. The following factors support the patient status of inpatient.   " The patient's presenting symptoms include Confusion and Diarrhea. " The worrisome physical exam findings include Confusion. " The initial radiographic and laboratory data are worrisome because of Colitis. " The chronic co-morbidities include history of C. difficile colitis, history of xanthogranulomatous  cholecystitis.  * I certify that at the point of admission it is my clinical judgment that the patient will require inpatient hospital care spanning beyond 2 midnights from the point of admission due to high intensity of service, high risk for further deterioration and high frequency of surveillance required.Merlene Laughter, D.O. [facility] Pager [phone]  If 7PM-7AM, please contact night-coverage [email or web address] Password [facility]  07/07/2018, 12:35 PM

## 2018-07-07 NOTE — ED Notes (Signed)
ED TO INPATIENT HANDOFF REPORT  Name/Age/Gender Jeremiah [name] 72 y.o. male  Code Status Code Status History    Date Active Date Inactive Code Status Order ID Comments User Context   06/04/2018 1704 06/06/2018 1842 Full Code [id number]  Kayleen Memos, DO ED   05/21/2018 1400 05/25/2018 2118 Full Code [id number]  Jovita Kussmaul, MD Inpatient   04/13/2018 2318 04/17/2018 1455 Full Code [id number]  Bethena Roys, MD Inpatient   03/27/2018 1550 04/01/2018 1623 Full Code [id number]  Dixie Dials, MD ED   01/30/2018 2045 02/02/2018 1646 Full Code [id number]  Opyd, Ilene Qua, MD ED      Home/SNF/Other Home  Chief Complaint weakness  Level of Care/Admitting Diagnosis ED Disposition    ED Disposition Condition [facility] Area: [facility] [[id number]]  Level of Care: Telemetry [5]  Admit to tele based on following criteria: Monitor QTC interval  Diagnosis: Clostridium difficile colitis [242683]  Admitting Physician: [place], [place] [[id number]]  Attending Physician: Raiford Noble LATIF [[id number]]  Estimated length of stay: past midnight tomorrow  Certification:: I certify this patient will need inpatient services for at least 2 midnights  PT Class (Do Not Modify): Inpatient [101]  PT Acc Code (Do Not Modify): Private [1]       Medical History Past Medical History:  Diagnosis Date  . Arthritis    "joints; shoulders, knees, hands, back" (05/21/2018)  . C. difficile diarrhea 04/2018  . Diastolic CHF ([place])   . High cholesterol   . History of gout   . Hypertension   . Peripheral vascular disease ([place])   . Pneumonia    "couple times" (05/21/2018)  . Sleep apnea    "has mask; won't use" (05/21/2018)  . Type II diabetes mellitus (HCC)     Allergies No Known Allergies  IV Location/Drains/Wounds Patient Lines/Drains/Airways Status   Active Line/Drains/Airways    Name:   Placement date:   Placement time:   Site:   Days:   Peripheral IV 07/07/18  Left;Lateral Forearm   07/07/18    0921    Forearm   less than 1   Closed System Drain Lateral RUQ Other (Comment)   04/14/18    -    RUQ   84   Closed System Drain 1 Right Abdomen Bulb (JP) 19 Fr.   05/21/18    -    Abdomen   47   Incision (Closed) 05/21/18 Abdomen   05/21/18    1207     47          Labs/Imaging Results for orders placed or performed during the hospital encounter of 07/07/18 (from the past 48 hour(s))  Urinalysis, Routine w reflex microscopic     Status: Abnormal   Collection Time: 07/07/18  7:46 AM  Result Value Ref Range   Color, Urine YELLOW YELLOW   APPearance CLEAR CLEAR   Specific Gravity, Urine 1.013 1.005 - 1.030   pH 6.0 5.0 - 8.0   Glucose, UA NEGATIVE NEGATIVE mg/dL   Hgb urine dipstick SMALL (A) NEGATIVE   Bilirubin Urine NEGATIVE NEGATIVE   Ketones, ur NEGATIVE NEGATIVE mg/dL   Protein, ur 100 (A) NEGATIVE mg/dL   Nitrite NEGATIVE NEGATIVE   Leukocytes, UA NEGATIVE NEGATIVE   RBC / HPF 11-20 0 - 5 RBC/hpf   WBC, UA 0-5 0 - 5 WBC/hpf   Bacteria, UA NONE SEEN NONE SEEN   Mucus PRESENT     Comment: Performed at [facility]  [place] [address] [place], [place] [address]  CBG monitoring, ED     Status: Abnormal   Collection Time: 07/07/18  7:51 AM  Result Value Ref Range   Glucose-Capillary 183 (H) 70 - 99 mg/dL  Comprehensive metabolic panel     Status: Abnormal   Collection Time: 07/07/18  8:50 AM  Result Value Ref Range   Sodium 141 135 - 145 mmol/L   Potassium 3.0 (L) 3.5 - 5.1 mmol/L   Chloride 100 98 - 111 mmol/L   CO2 28 22 - 32 mmol/L   Glucose, Bld 206 (H) 70 - 99 mg/dL   BUN 9 8 - 23 mg/dL   Creatinine, Ser 1.10 0.61 - 1.24 mg/dL   Calcium 9.1 8.9 - 10.3 mg/dL   Total Protein 8.0 6.5 - 8.1 g/dL   Albumin 2.9 (L) 3.5 - 5.0 g/dL   AST 14 (L) 15 - 41 U/L   ALT 13 0 - 44 U/L   Alkaline Phosphatase 101 38 - 126 U/L   Total Bilirubin 1.0 0.3 - 1.2 mg/dL   GFR calc non Af Amer >60 >60 mL/min   GFR calc Af Amer >60 >60  mL/min    Comment: (NOTE) The eGFR has been calculated using the CKD EPI equation. This calculation has not been validated in all clinical situations. eGFR's persistently <60 mL/min signify possible Chronic Kidney Disease.    Anion gap 13 5 - 15    Comment: Performed at [facility], [place] [address], [place], [place] [address]  CBC WITH DIFFERENTIAL     Status: Abnormal   Collection Time: 07/07/18  8:50 AM  Result Value Ref Range   WBC 24.9 (H) 4.0 - 10.5 K/uL   RBC 4.33 4.22 - 5.81 MIL/uL   Hemoglobin 11.6 (L) 13.0 - 17.0 g/dL   HCT 35.4 (L) 39.0 - 52.0 %   MCV 81.8 78.0 - 100.0 fL   MCH 26.8 26.0 - 34.0 pg   MCHC 32.8 30.0 - 36.0 g/dL   RDW 16.5 (H) 11.5 - 15.5 %   Platelets 301 150 - 400 K/uL   Neutrophils Relative % 87 %   Neutro Abs 21.5 (H) 1.7 - 7.7 K/uL   Lymphocytes Relative 6 %   Lymphs Abs 1.6 0.7 - 4.0 K/uL   Monocytes Relative 7 %   Monocytes Absolute 1.7 (H) 0.1 - 1.0 K/uL   Eosinophils Relative 0 %   Eosinophils Absolute 0.1 0.0 - 0.7 K/uL   Basophils Relative 0 %   Basophils Absolute 0.0 0.0 - 0.1 K/uL    Comment: Performed at [facility], [place] [address], [place], [place] [address]  Blood Culture (routine x 2)     Status: None (Preliminary result)   Collection Time: 07/07/18  8:50 AM  Result Value Ref Range   Specimen Description      BLOOD LEFT ANTECUBITAL Performed at [facility] [address], [place], [place] [address]    Special Requests      BOTTLES DRAWN AEROBIC ONLY Blood Culture adequate volume Performed at [facility] [address], [place], [place] [address]    Culture PENDING    Report Status PENDING   Brain natriuretic peptide     Status: Abnormal   Collection Time: 07/07/18  8:50 AM  Result Value Ref Range   B Natriuretic Peptide 499.2 (H) 0.0 - 100.0 pg/mL    Comment: Performed at [facility], [address] [place], [place] [address]  I-stat  troponin, ED     Status: None   Collection Time: 07/07/18  8:58 AM  Result Value Ref Range   Troponin i, poc 0.01 0.00 - 0.08 ng/mL   Comment 3            Comment: Due to the release kinetics of cTnI, a negative result within the first hours of the onset of symptoms does not rule out myocardial infarction with certainty. If myocardial infarction is still suspected, repeat the test at appropriate intervals.   I-Stat CG4 Lactic Acid, ED  (not at  [facility])     Status: None   Collection Time: 07/07/18  9:00 AM  Result Value Ref Range   Lactic Acid, Venous 1.26 0.5 - 1.9 mmol/L   Ct Head Wo Contrast  Result Date: 07/07/2018 CLINICAL DATA:  Altered level of consciousness EXAM: CT HEAD WITHOUT CONTRAST TECHNIQUE: Contiguous axial images were obtained from the base of the skull through the vertex without intravenous contrast. COMPARISON:  07/17/2016 FINDINGS: Brain: There is atrophy and chronic small vessel disease changes. No acute intracranial abnormality. Specifically, no hemorrhage, hydrocephalus, mass lesion, acute infarction, or significant intracranial injury. Vascular: No hyperdense vessel or unexpected calcification. Skull: No acute calvarial abnormality. Sinuses/Orbits: Visualized paranasal sinuses and mastoids clear. Orbital soft tissues unremarkable. Other: None IMPRESSION: No acute intracranial abnormality. Atrophy, chronic microvascular disease. Electronically Signed   By: Rolm Baptise M.D.   On: 07/07/2018 11:00   Ct Abdomen Pelvis W Contrast  Result Date: 07/07/2018 CLINICAL DATA:  72 year old male with continued abdominal pain and diarrhea. History of acute cholecystitis with attempted cholecystectomy. Percutaneous cholecystostomy tube placed and recently removed. EXAM: CT ABDOMEN AND PELVIS WITH CONTRAST TECHNIQUE: Multidetector CT imaging of the abdomen and pelvis was performed using the standard protocol following bolus administration of intravenous contrast. CONTRAST:  173m  ISOVUE-300 IOPAMIDOL (ISOVUE-300) INJECTION 61% COMPARISON:  06/04/2018 and prior CTs FINDINGS: Lower chest: A small to moderate RIGHT pleural effusion and trace LEFT pleural effusion are again noted. Coronary artery calcifications and UPPER limits normal heart size noted. Hepatobiliary: The liver is unremarkable. Ill-defined soft tissue/stranding in the region of the gallbladder again noted. No biliary dilatation. Pancreas: Unremarkable Spleen: Unremarkable Adrenals/Urinary Tract: Kidneys, adrenal glands and bladder are unremarkable. Stomach/Bowel: Possible mild circumferential wall thickening of the descending and sigmoid colon noted and may represent colitis. There is no evidence of bowel obstruction. There is mild wall enhancement of the appendix which is borderline and size but does not appear significantly changed in size when compared to 01/30/2018 CT. No evidence of abscess. Vascular/Lymphatic: Aortic atherosclerosis. No enlarged abdominal or pelvic lymph nodes. Reproductive: Prostate is unremarkable. Other: A tiny amount of perihepatic fluid is unchanged. No other ascites identified. The percutaneous abdominal catheter has been removed. Catheter tract is identified. Musculoskeletal: No acute or suspicious bony lesions. IMPRESSION: 1. Possible mild circumferential wall thickening of the descending and sigmoid colon-question colitis. No evidence of bowel obstruction. 2. Interval removal of abdominal drainage catheter. No evidence of abscess. 3. Small to moderate RIGHT pleural effusion and trace LEFT pleural effusion again noted. 4. No other significant change. 5. Coronary artery and aortic Atherosclerosis (ICD10-I70.0). Electronically Signed   By: JMargarette CanadaM.D.   On: 07/07/2018 11:14   Dg Chest Port 1 View  Result Date: 07/07/2018 CLINICAL DATA:  Altered mental status EXAM: PORTABLE CHEST 1 VIEW COMPARISON:  May 18, 2018 FINDINGS: There are small pleural effusions bilaterally with bibasilar  atelectasis.  There is mild left midlung atelectasis. There is no consolidation. Heart is upper normal in size with pulmonary vascularity normal. No adenopathy. There is aortic atherosclerosis. There is calcification in the proximal humeral diaphysis on the right. IMPRESSION: Small pleural effusions bilaterally, slightly larger on the right than on the left. Bibasilar and left mid lung atelectasis. No consolidation. Stable cardiac silhouette. There is aortic atherosclerosis. There is either a small enchondroma or bone infarct in the proximal right humeral diaphysis, stable. Aortic Atherosclerosis (ICD10-I70.0). Electronically Signed   By: Lowella Grip III M.D.   On: 07/07/2018 08:00    Pending Labs Unresulted Labs (From admission, onward)    Start     Ordered   07/07/18 1150  C difficile quick scan w PCR reflex  (C Difficile quick screen w PCR reflex panel)  Once, for 24 hours,   R     07/07/18 1149   07/07/18 1150  Gastrointestinal Panel by PCR , Stool  (Gastrointestinal Panel by PCR, Stool)  STAT,   R     07/07/18 1149   07/07/18 0737  Blood Culture (routine x 2)  BLOOD CULTURE X 2,   STAT     07/07/18 0737   07/07/18 0737  Urine culture  STAT,   STAT     07/07/18 0737   Signed and Held  CBC  (enoxaparin (LOVENOX)    CrCl >/= 30 ml/min)  Once,   R    Comments:  Baseline for enoxaparin therapy IF NOT ALREADY DRAWN.  Notify MD if PLT < 100 K.   Question:  Specimen collection method  Answer:  Lab=Lab collect   Signed and Held   Signed and Held  Creatinine, serum  (enoxaparin (LOVENOX)    CrCl >/= 30 ml/min)  Once,   R    Comments:  Baseline for enoxaparin therapy IF NOT ALREADY DRAWN.   Question:  Specimen collection method  Answer:  Lab=Lab collect   Signed and Held   Signed and Held  Creatinine, serum  (enoxaparin (LOVENOX)    CrCl >/= 30 ml/min)  Weekly,   R    Comments:  while on enoxaparin therapy   Question:  Specimen collection method  Answer:  Lab=Lab collect   Signed and Held    Signed and Held  TSH  Add-on,   R    Question:  Specimen collection method  Answer:  Lab=Lab collect   Signed and Held   Signed and Held  C difficile quick scan w PCR reflex  (C Difficile quick screen w PCR reflex panel)  Once, for 24 hours,   R     Signed and Held          Vitals/Pain Today's Vitals   07/07/18 1104 07/07/18 1259 07/07/18 1330 07/07/18 1400  BP:  136/85 131/64 127/62  Pulse:  81 83 75  Resp:  17  17  Temp: (!) 101 F (38.3 C)     TempSrc: Rectal     SpO2:  94% 94% 95%  Weight:      Height:        Isolation Precautions Enteric precautions (UV disinfection)  Medications Medications  iopamidol (ISOVUE-300) 61 % injection (has no administration in time range)  acetaminophen (TYLENOL) tablet 650 mg (has no administration in time range)    Or  acetaminophen (TYLENOL) suppository 650 mg (has no administration in time range)  iopamidol (ISOVUE-300) 61 % injection 100 mL (100 mLs Intravenous Contrast Given 07/07/18 1030)  potassium chloride 10 mEq  in 100 mL IVPB (0 mEq Intravenous Stopped 07/07/18 1219)  potassium chloride 20 MEQ/15ML (10%) solution 40 mEq (40 mEq Oral Given 07/07/18 1111)  meropenem (MERREM) 1 g in sodium chloride 0.9 % 100 mL IVPB (0 g Intravenous Stopped 07/07/18 1219)  acetaminophen (TYLENOL) tablet 650 mg (650 mg Oral Given 07/07/18 1146)    Mobility non-ambulatory

## 2018-07-07 NOTE — ED Notes (Signed)
Bed: [id number] Expected date:  Expected time:  Means of arrival:  Comments: EMS 72 yo male from home diarrhea/recent drain removed after chole

## 2018-07-07 NOTE — ED Triage Notes (Signed)
Per EMS: Pt hx of gastroenteritis, drainage tube removed earlier this week.  Pt c/o of returned diarrhea, and lethargy.  Pt oriented, but very lethargic and weak.  Pt hx of CHF and DM.

## 2018-07-08 DIAGNOSIS — E876 Hypokalemia: Secondary | ICD-10-CM

## 2018-07-08 DIAGNOSIS — R8271 Bacteriuria: Secondary | ICD-10-CM

## 2018-07-08 DIAGNOSIS — E785 Hyperlipidemia, unspecified: Secondary | ICD-10-CM

## 2018-07-08 DIAGNOSIS — A0472 Enterocolitis due to Clostridium difficile, not specified as recurrent: Secondary | ICD-10-CM

## 2018-07-08 DIAGNOSIS — E119 Type 2 diabetes mellitus without complications: Secondary | ICD-10-CM

## 2018-07-08 DIAGNOSIS — L8990 Pressure ulcer of unspecified site, unspecified stage: Secondary | ICD-10-CM

## 2018-07-08 DIAGNOSIS — R7881 Bacteremia: Secondary | ICD-10-CM

## 2018-07-08 DIAGNOSIS — I10 Essential (primary) hypertension: Secondary | ICD-10-CM

## 2018-07-08 DIAGNOSIS — I739 Peripheral vascular disease, unspecified: Secondary | ICD-10-CM

## 2018-07-08 LAB — COMPREHENSIVE METABOLIC PANEL
ALT: 11 U/L (ref 0–44)
AST: 11 U/L — ABNORMAL LOW (ref 15–41)
Albumin: 2.1 g/dL — ABNORMAL LOW (ref 3.5–5.0)
Alkaline Phosphatase: 83 U/L (ref 38–126)
Anion gap: 9 (ref 5–15)
BUN: 13 mg/dL (ref 8–23)
CO2: 27 mmol/L (ref 22–32)
Calcium: 8.2 mg/dL — ABNORMAL LOW (ref 8.9–10.3)
Chloride: 103 mmol/L (ref 98–111)
Creatinine, Ser: 1.18 mg/dL (ref 0.61–1.24)
GFR calc Af Amer: >60 mL/min (ref >60)
GFR calc non Af Amer: 59 mL/min — ABNORMAL LOW (ref >60)
Glucose, Bld: 143 mg/dL — ABNORMAL HIGH (ref 70–99)
Potassium: 3.2 mmol/L — ABNORMAL LOW (ref 3.5–5.1)
Sodium: 139 mmol/L (ref 135–145)
Total Bilirubin: 0.5 mg/dL (ref 0.3–1.2)
Total Protein: 6.2 g/dL — ABNORMAL LOW (ref 6.5–8.1)

## 2018-07-08 LAB — CBC WITH DIFFERENTIAL/PLATELET
Basophils Absolute: 0 10*3/uL (ref 0.0–0.1)
Basophils Relative: 0 %
Eosinophils Absolute: 0.2 10*3/uL (ref 0.0–0.7)
Eosinophils Relative: 1 %
HCT: 29.2 % — ABNORMAL LOW (ref 39.0–52.0)
Hemoglobin: 9.5 g/dL — ABNORMAL LOW (ref 13.0–17.0)
Lymphocytes Relative: 11 %
Lymphs Abs: 1.8 10*3/uL (ref 0.7–4.0)
MCH: 26.6 pg (ref 26.0–34.0)
MCHC: 32.5 g/dL (ref 30.0–36.0)
MCV: 81.8 fL (ref 78.0–100.0)
Monocytes Absolute: 1.7 10*3/uL — ABNORMAL HIGH (ref 0.1–1.0)
Monocytes Relative: 11 %
Neutro Abs: 12.1 10*3/uL — ABNORMAL HIGH (ref 1.7–7.7)
Neutrophils Relative %: 77 %
Platelets: 255 10*3/uL (ref 150–400)
RBC: 3.57 MIL/uL — ABNORMAL LOW (ref 4.22–5.81)
RDW: 16.9 % — ABNORMAL HIGH (ref 11.5–15.5)
WBC: 15.7 10*3/uL — ABNORMAL HIGH (ref 4.0–10.5)

## 2018-07-08 LAB — BLOOD CULTURE ID PANEL (REFLEXED)

## 2018-07-08 LAB — URINE CULTURE: Culture: NO GROWTH

## 2018-07-08 LAB — PHOSPHORUS: Phosphorus: 2.5 mg/dL (ref 2.5–4.6)

## 2018-07-08 LAB — GLUCOSE, CAPILLARY
Glucose-Capillary: 147 mg/dL — ABNORMAL HIGH (ref 70–99)
Glucose-Capillary: 180 mg/dL — ABNORMAL HIGH (ref 70–99)
Glucose-Capillary: 132 mg/dL — ABNORMAL HIGH (ref 70–99)
Glucose-Capillary: 88 mg/dL (ref 70–99)

## 2018-07-08 LAB — MAGNESIUM: Magnesium: 1.5 mg/dL — ABNORMAL LOW (ref 1.7–2.4)

## 2018-07-08 LAB — TSH: TSH: 0.264 u[IU]/mL — ABNORMAL LOW (ref 0.350–4.500)

## 2018-07-08 MED ORDER — MAGNESIUM SULFATE 2 GM/50ML IV SOLN
2.0000 g | Freq: Once | INTRAVENOUS | Status: AC
  Administered 2018-07-08: 2 g via INTRAVENOUS
  Filled 2018-07-08: qty 50

## 2018-07-08 MED ORDER — BOOST / RESOURCE BREEZE PO LIQD CUSTOM
1.0000 | Freq: Two times a day (BID) | ORAL | Status: DC
  Administered 2018-07-09 – 2018-07-10 (×3): 1 via ORAL

## 2018-07-08 MED ORDER — POTASSIUM CHLORIDE CRYS ER 20 MEQ PO TBCR
40.0000 meq | EXTENDED_RELEASE_TABLET | ORAL | Status: AC
  Administered 2018-07-08 (×2): 40 meq via ORAL
  Filled 2018-07-08 (×2): qty 2

## 2018-07-08 MED ORDER — ADULT MULTIVITAMIN W/MINERALS CH
1.0000 | ORAL_TABLET | Freq: Every day | ORAL | Status: DC
  Administered 2018-07-08 – 2018-07-11 (×4): 1 via ORAL
  Filled 2018-07-08 (×4): qty 1

## 2018-07-08 MED ORDER — SODIUM CHLORIDE 0.9 % IV SOLN: INTRAVENOUS | Status: DC | PRN

## 2018-07-08 MED ORDER — ENSURE ENLIVE PO LIQD: 237.0000 mL | ORAL | Status: DC

## 2018-07-08 MED ORDER — SODIUM CHLORIDE 0.9 % IV SOLN
INTRAVENOUS | Status: DC
  Administered 2018-07-08 – 2018-07-09 (×2): via INTRAVENOUS

## 2018-07-08 NOTE — Progress Notes (Signed)
CSW met with patient at bedside to determine if the patient was agreeable to SNF placement. Patient declines SNF placement and plans to continue home health services. CSW will sign off.   Nicole Sinclair, LCSW, MSW Clinical Social Worker  [phone] 07/08/2018  3:31 PM 

## 2018-07-08 NOTE — Progress Notes (Signed)
PROGRESS NOTE    LENNOX DOLBERRY  ZOX:[id number] DOB: 07-21-1946 DOA: 07/07/2018 PCP: Rinaldo Cloud, MD    Brief Narrative:  72 year old male who presented with diarrhea and confusion.  He does have significant past medical history for complicated cholecystectomy status post drain placement, gout, hypertension, peripheral vascular disease, type 2 diabetes mellitus and recurrent C. difficile diarrhea.  About 2 days prior to hospitalization he completed antibiotic therapy and his biliary drain was removed.  Since then he developed persistent, profuse diarrhea, associated with confusion.  He tested positive for urinalysis as an outpatient with Citrobacter koseri.  She will physical examination his temperature was 101, blood pressure 136/85, heart rate 81, respiratory rate 17, oxygen saturation 94%.  His lungs had decreased breath sounds bilaterally, heart S1-S2 present rhythmic tachycardic, the abdomen was soft, nontender, nondistended, no lower extremity edema.  Sodium 141, potassium 3.0, chloride 100, bicarb 28, glucose 206, BUN 9, creatinine 1.1, white count 24.9, hemoglobin 11.6, hematocrit 35.4, platelets 301.  Urinalysis with specific gravity 1.013, 100 protein, 0-5 white cells.  Stool tested positive for C. Difficile.  CT of the abdomen and pelvis with with possible mild circumferential wall thickening of the descending and sigmoid colon.  Small to moderate right pleural effusion and trace left pleural effusion.  Head CT negative for acute changes.  Chest x-ray with bilateral effusions more right and left.  With sinus rhythm, normal axis, normal intervals, positive PACs.  Patient was admitted to the hospital with working diagnosis of sepsis due to C. difficile colitis.  To rule out underlying urinary tract infection.    Assessment & Plan:   Active Problems:   PVD (peripheral vascular disease) (HCC)   Diabetes mellitus without complication (HCC)   Hypertension   Normocytic anemia  Clostridium difficile colitis   Hypokalemia   HLD (hyperlipidemia)   Pressure injury of skin   1. Recurrent C diff colitis/ diarrhea, present on admission. Complicated with hypokalemia and hypomagnesemia. Will continue antibiotic therapy with oral vancomycin, will discontinue IV metronidazole, symptoms have been improving, white cell count down to 15,7 from 24.9, will continue hydration with isotonic saline. Contaminated blood culture with coag negative staph. Serum K at 3,2 and Mg at 1,5, will correct with 80 meq kcl and 2 grams of Mg sulfate, follow on renal panel in am.   2. Urine tract infection. (present on admission). No urinary symptoms, urine analysis with no pyuria, culture with no growth, in the setting of recurrent c diff, will discontinue meropenem for now. Will continue to follow cell count and temperature curve.   3. T2DM. Will continue glucose cover and monitoring with insulin sliding scale, improved po intake, capillary glucose 165, 144, 147, 180.   4. Diastolic CHF. No signs of acute exacerbation, will continue blood pressure monitoring, and gentle hydration with isotonic saline.   5. HTN. Continue blood pressure control with losartan and metoprolol. Systolic blood pressure 140 to 150 mmHg.   DVT prophylaxis: enoxaparin   Code Status: full Family Communication: I spoke with patient's son at the bedside and all questions were addressed.  Disposition Plan/ discharge barriers: Pending clinical improvement   Consultants:   ID  Procedures:     Antimicrobials:   Oral Vancomycin.    Subjective: Patient is feeling better, not back to baseline, continue to have diarrhea, improved appetite, no nausea or vomiting, no dyspnea or chest pain.   Objective: Vitals:   07/07/18 1609 07/07/18 2106 07/07/18 2135 07/08/18 0550  BP:   (!) 158/66 Marland Kitchen)  143/63  Pulse:  93 83 76  Resp:   18 16  Temp:   99.5 F (37.5 C) 99.6 F (37.6 C)  TempSrc:   Oral Oral  SpO2:   94% 98%    Weight: 69.4 kg   70.2 kg  Height: 5\' 9"  (1.753 m)       Intake/Output Summary (Last 24 hours) at 07/08/2018 1105 Last data filed at 07/08/2018 0600 Gross per 24 hour  Intake 1331.96 ml  Output 400 ml  Net 931.96 ml   Filed Weights   07/07/18 0728 07/07/18 1609 07/08/18 0550  Weight: 63.5 kg 69.4 kg 70.2 kg    Examination:   General: Not in pain or dyspnea, deconditioned and ill looking appearing.  Neurology: Awake and alert, non focal  E ENT: no pallor, no icterus, oral mucosa moist Cardiovascular: No JVD. S1-S2 present, rhythmic, no gallops, rubs, or murmurs. No lower extremity edema. Pulmonary: decreased breath sounds bilaterally at bases, no wheezing, rhonchi or rales. Gastrointestinal. Abdomen mild distended with no organomegaly, non tender, no rebound or guarding Skin. No rashes Musculoskeletal: no joint deformities     Data Reviewed: I have personally reviewed following labs and imaging studies  CBC: Recent Labs  Lab 07/07/18 0850 07/08/18 0518  WBC 24.9* 15.7*  NEUTROABS 21.5* 12.1*  HGB 11.6* 9.5*  HCT 35.4* 29.2*  MCV 81.8 81.8  PLT 301 255   Basic Metabolic Panel: Recent Labs  Lab 07/07/18 0850 07/08/18 0518  NA 141 139  K 3.0* 3.2*  CL 100 103  CO2 28 27  GLUCOSE 206* 143*  BUN 9 13  CREATININE 1.10 1.18  CALCIUM 9.1 8.2*  MG  --  1.5*  PHOS  --  2.5   GFR: Estimated Creatinine Clearance: 54.5 mL/min (by C-G formula based on SCr of 1.18 mg/dL). Liver Function Tests: Recent Labs  Lab 07/07/18 0850 07/08/18 0518  AST 14* 11*  ALT 13 11  ALKPHOS 101 83  BILITOT 1.0 0.5  PROT 8.0 6.2*  ALBUMIN 2.9* 2.1*   No results for input(s): LIPASE, AMYLASE in the last 168 hours. No results for input(s): AMMONIA in the last 168 hours. Coagulation Profile: No results for input(s): INR, PROTIME in the last 168 hours. Cardiac Enzymes: No results for input(s): CKTOTAL, CKMB, CKMBINDEX, TROPONINI in the last 168 hours. BNP (last 3 results) No  results for input(s): PROBNP in the last 8760 hours. HbA1C: No results for input(s): HGBA1C in the last 72 hours. CBG: Recent Labs  Lab 07/07/18 0751 07/07/18 1544 07/07/18 2202 07/08/18 0814  GLUCAP 183* 165* 144* 147*   Lipid Profile: No results for input(s): CHOL, HDL, LDLCALC, TRIG, CHOLHDL, LDLDIRECT in the last 72 hours. Thyroid Function Tests: Recent Labs    07/08/18 0518  TSH 0.264*   Anemia Panel: No results for input(s): VITAMINB12, FOLATE, FERRITIN, TIBC, IRON, RETICCTPCT in the last 72 hours.    Radiology Studies: I have reviewed all of the imaging during this hospital visit personally     Scheduled Meds: . aspirin EC  81 mg Oral Daily  . brimonidine  1 drop Both Eyes BID  . enoxaparin (LOVENOX) injection  40 mg Subcutaneous Q24H  . insulin aspart  0-5 Units Subcutaneous QHS  . insulin aspart  0-9 Units Subcutaneous TID WC  . losartan  100 mg Oral Daily  . metoprolol succinate  50 mg Oral Daily  . potassium chloride  40 mEq Oral Q4H  . simvastatin  40 mg Oral Daily  .  timolol  1 drop Both Eyes BID  . vancomycin  125 mg Oral QID   Followed by  . [START ON 07/21/2018] vancomycin  125 mg Oral BID   Followed by  . [START ON 07/29/2018] vancomycin  125 mg Oral Daily   Followed by  . [START ON 08/05/2018] vancomycin  125 mg Oral QODAY   Followed by  . [START ON 09/02/2018] vancomycin  125 mg Oral Q3 days   Continuous Infusions: . sodium chloride    . sodium chloride 75 mL/hr at 07/08/18 1005  . magnesium sulfate 1 - 4 g bolus IVPB    . metronidazole 500 mg (07/08/18 1006)     LOS: 1 day        Anely Spiewak Annett Gulaaniel Jossie Smoot, MD [facility] Pager [phone]

## 2018-07-08 NOTE — Progress Notes (Signed)
PHARMACY - PHYSICIAN COMMUNICATION CRITICAL VALUE ALERT - BLOOD CULTURE IDENTIFICATION (BCID)  [name] is an 72 y.o. male who [facility] with a chief complaint of recurrent C diff   Name of physician (or Provider) Contacted: Arrien  Current antibiotics: po vanc and IV flagyl  Changes to prescribed antibiotics recommended:  none  Results for orders placed or performed during the hospital encounter of 07/07/18  Blood Culture ID Panel (Reflexed) (Collected: 07/07/2018  8:50 AM)  Result Value Ref Range   Enterococcus species NOT DETECTED NOT DETECTED   Listeria monocytogenes NOT DETECTED NOT DETECTED   Staphylococcus species DETECTED (A) NOT DETECTED   Staphylococcus aureus NOT DETECTED NOT DETECTED   Methicillin resistance NOT DETECTED NOT DETECTED   Streptococcus species NOT DETECTED NOT DETECTED   Streptococcus agalactiae NOT DETECTED NOT DETECTED   Streptococcus pneumoniae NOT DETECTED NOT DETECTED   Streptococcus pyogenes NOT DETECTED NOT DETECTED   Acinetobacter baumannii NOT DETECTED NOT DETECTED   Enterobacteriaceae species NOT DETECTED NOT DETECTED   Enterobacter cloacae complex NOT DETECTED NOT DETECTED   Escherichia coli NOT DETECTED NOT DETECTED   Klebsiella oxytoca NOT DETECTED NOT DETECTED   Klebsiella pneumoniae NOT DETECTED NOT DETECTED   Proteus species NOT DETECTED NOT DETECTED   Serratia marcescens NOT DETECTED NOT DETECTED   Haemophilus influenzae NOT DETECTED NOT DETECTED   Neisseria meningitidis NOT DETECTED NOT DETECTED   Pseudomonas aeruginosa NOT DETECTED NOT DETECTED   Candida albicans NOT DETECTED NOT DETECTED   Candida glabrata NOT DETECTED NOT DETECTED   Candida krusei NOT DETECTED NOT DETECTED   Candida parapsilosis NOT DETECTED NOT DETECTED   Candida tropicalis NOT DETECTED NOT DETECTED    Arley Phenixllen Braidon Chermak RPh 07/08/2018, 11:22 AM Pager [phone]

## 2018-07-08 NOTE — Evaluation (Signed)
Clinical/Bedside Swallow Evaluation Patient Details  Name: Jeremiah [name] MRN: [id number] Date of Birth: 1946-08-19  Today's Date: 07/08/2018 Time: SLP Start Time (ACUTE ONLY): 1505 SLP Stop Time (ACUTE ONLY): 1525 SLP Time Calculation (min) (ACUTE ONLY): 20 min  Past Medical History:  Past Medical History:  Diagnosis Date  . Arthritis    "joints; shoulders, knees, hands, back" (05/21/2018)  . C. difficile diarrhea 04/2018  . Diastolic CHF (HCC)   . High cholesterol   . History of gout   . Hypertension   . Peripheral vascular disease (HCC)   . Pneumonia    "couple times" (05/21/2018)  . Sleep apnea    "has mask; won't use" (05/21/2018)  . Type II diabetes mellitus (HCC)    Past Surgical History:  Past Surgical History:  Procedure Laterality Date  . CATARACT EXTRACTION W/ INTRAOCULAR LENS  IMPLANT, BILATERAL Bilateral   . CHOLECYSTECTOMY  05/21/2018   ATTEMPTED LAPAROSCOPIC CHOLECYSTECTOMY, OPEN DRAINAGE OF GALLBLADDER WITH BIOPSY  . CHOLECYSTECTOMY N/A 05/21/2018   Procedure: ATTEMPTED LAPAROSCOPIC CHOLECYSTECTOMY, OPEN DRAINAGE OF GALLBLADDER WITH BIOPSY;  Surgeon: Griselda Mineroth, Paul III, MD;  Location: [facility] OR;  Service: General;  Laterality: N/A;  . COLONOSCOPY W/ POLYPECTOMY    . IR CATHETER TUBE CHANGE  04/14/2018  . IR CHOLANGIOGRAM EXISTING TUBE  03/17/2018  . IR PERC CHOLECYSTOSTOMY  01/31/2018  . IR RADIOLOGIST EVAL & MGMT  03/02/2018   HPI:  72 y.o. 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 [facility] a complaints of significant diarrhea and confusion, per md note.  Patient's wife stated that he was doing well until JP drain removal a few days PTA.  Per Md note, after JP drain removed, pt with diarrhea and confusion and had a difficult time communicating. Wife states that he has been doing okay but  not eating very well and was concerned that he had all his loose diarrhea. Patient dx with recurrent c.diff.   Swallow evaluation ordered by Md.  Pt with right pleural effusion, CT head negative x atrophy.     Assessment / Plan / Recommendation Clinical Impression  Patient presents with functional oropharyngeal swallow ability.  No indication of airway compromise or focal CN deficits with all po observed.  Pt able to self feed without difficulties.  Passed 3 ounce water test.  He does admit to issues with swallow large pills for which he compensates by taking them with applesauce.  Recommend regular/thin consistency, no SLP follow up needed. Thanks.  SLP Visit Diagnosis: Dysphagia, unspecified (R13.10)    Aspiration Risk  No limitations    Diet Recommendation Regular;Thin liquid   Liquid Administration via: Cup;Straw Medication Administration: Whole meds with liquid(as tolerated) Supervision: Patient able to self feed Compensations: Slow rate;Small sips/bites    Other  Recommendations Oral Care Recommendations: Oral care BID   Follow up Recommendations None      Frequency and Duration            Prognosis        Swallow Study   General Date of Onset: 07/08/18 HPI: 72 y.o. 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 [facility] of [facility] and confusion, per md note.  Patient's wife  stated that he was doing well until JP drain removal a few days PTA.  Per Md note, after JP drain removed, pt with diarrhea and confusion and had a difficult time communicating. Wife states that he has been doing okay but not eating very well and was concerned that he had all his loose diarrhea. Patient dx with recurrent c.diff.   Swallow evaluation ordered by Md.  Pt with right pleural effusion, CT  head negative x atrophy.   Type of Study: Bedside Swallow Evaluation Diet Prior to this Study: Regular;Thin liquids Temperature Spikes Noted: No Respiratory Status: Room air History of Recent Intubation: No Behavior/Cognition: Alert;Cooperative Oral Cavity Assessment: Within Functional Limits Oral Care Completed by SLP: No Oral Cavity - Dentition: Adequate natural dentition Vision: Functional for self-feeding Self-Feeding Abilities: Able to feed self Patient Positioning: Upright in bed Baseline Vocal Quality: Normal Volitional Cough: Strong Volitional Swallow: Able to elicit    Oral/Motor/Sensory Function Overall Oral Motor/Sensory Function: Within functional limits   Ice Chips Ice chips: Not tested   Thin Liquid Thin Liquid: Within functional limits Presentation: Cup;Straw    Nectar Thick Nectar Thick Liquid: Not tested   Honey Thick Honey Thick Liquid: Not tested   Puree Puree: Within functional limits Presentation: Self Fed;Spoon   Solid     Solid: Within functional limits Presentation: Self Fed;Spoon      Chales AbrahamsKimball, Taytum Wheller Ann 07/08/2018,4:02 PM   Donavan Burnetamara Malicia Blasdel, MS [facility] SLP [phone]

## 2018-07-08 NOTE — Evaluation (Signed)
Physical Therapy Evaluation Patient Details Name: Curley SpiceWalter L Placzek MRN: [id number] DOB: April 27, 1946 Today's Date: 07/08/2018   History of Present Illness  72 yo male admitted with C diff, UTI, confusion, weakness. Hx of gout, cholectystectomy, PVD, DM, CHF  Clinical Impression  On eval, pt required Mod assist +2 for mobility. He was able to stand x 2 and pivot to the recliner with use of a RW. Pt presents with general weakness, decreased activity tolerance, and impaired gait and balance. Son was present during session. He feels pt may benefit from ST rehab placement but he wants his mother (pt's wife) to give her input as well. Will continue to follow and progress activity as tolerated. Recommend SNF for rehab.     Follow Up Recommendations SNF(HHPT if pt/family decline placement)    Equipment Recommendations  3in1 (PT)    Recommendations for Other Services OT consult     Precautions / Restrictions Precautions Precautions: Fall Precaution Comments: incontinence Restrictions Weight Bearing Restrictions: No      Mobility  Bed Mobility Overal bed mobility: Needs Assistance Bed Mobility: Supine to Sit     Supine to sit: Mod assist;HOB elevated     General bed mobility comments: Assist for trunk and bil LEs. Utilized bedpad to aid with scooting, positioning. Increased time. Pt had some difficulty processing task.   Transfers Overall transfer level: Needs assistance Equipment used: Rolling walker (2 wheeled) Transfers: Sit to/from [employer]GI CorporationStand;Stand Pivot Transfers Sit to Stand: Mod assist;+2 physical assistance;+2 safety/equipment Stand pivot transfers: Mod assist;+2 physical assistance;+2 safety/equipment       General transfer comment: Multimodal cueing required. Sit to stand x 2. Assist to rise, stabilize, control descent. Cues for safety, posture, proper use of RW. Increased time required. Stand pivot, bed to recliner, with RW.   Ambulation/Gait             General  Gait Details: NT-due to incontinence and weakness  Stairs            Wheelchair Mobility    Modified Rankin (Stroke Patients Only)       Balance Overall balance assessment: Needs assistance;History of Falls         Standing balance support: Bilateral upper extremity supported Standing balance-Leahy Scale: Poor                               Pertinent Vitals/Pain Pain Assessment: Faces Faces Pain Scale: Hurts little more Pain Location: heels Pain Descriptors / Indicators: Sore Pain Intervention(s): Limited activity within patient's tolerance;Monitored during session;Repositioned    Home Living Family/patient expects to be discharged to:: Unsure Living Arrangements: Spouse/significant other Available Help at Discharge: Family;Available 24 hours/day Type of Home: House Home Access: Stairs to enter Entrance Stairs-Rails: Doctor, general practiceight;Left Entrance Stairs-Number of Steps: 4 Home Layout: One level Home Equipment: Walker - 2 wheels;Shower seat Additional Comments: wife has been helping with sponge bathing due to drain    Prior Function Level of Independence: Needs assistance   Gait / Transfers Assistance Needed: assist for OOB/mobility. Used RW for short distance ambulation           Hand Dominance        Extremity/Trunk Assessment   Upper Extremity Assessment Upper Extremity Assessment: Defer to OT evaluation    Lower Extremity Assessment Lower Extremity Assessment: Generalized weakness    Cervical / Trunk Assessment Cervical / Trunk Assessment: Kyphotic  Communication   Communication: No difficulties  Cognition Arousal/Alertness: Awake/alert Behavior  During Therapy: Flat affect Overall Cognitive Status: Impaired/Different from baseline Area of Impairment: Orientation;Problem solving                 Orientation Level: Disoriented to;Place;Time;Situation           Problem Solving: Slow processing;Decreased initiation;Difficulty  sequencing;Requires verbal cues;Requires tactile cues        General Comments      Exercises     Assessment/Plan    PT Assessment Patient needs continued PT services  PT Problem List Decreased strength;Decreased balance;Decreased activity tolerance;Decreased mobility;Decreased cognition       PT Treatment Interventions DME instruction;Gait training;Functional mobility training;Therapeutic activities;Balance training;Patient/family education;Therapeutic exercise    PT Goals (Current goals can be found in the Care Plan section)  Acute Rehab PT Goals Patient Stated Goal: per son, to get stronger PT Goal Formulation: With family Time For Goal Achievement: 07/22/18 Potential to Achieve Goals: Fair    Frequency Min 3X/week(unsure if wife will agree to SNF)   Barriers to discharge        Co-evaluation               AM-PAC PT "6 Clicks" Daily Activity  Outcome Measure Difficulty turning over in bed (including adjusting bedclothes, sheets and blankets)?: Unable Difficulty moving from lying on back to sitting on the side of the bed? : Unable Difficulty sitting down on and standing up from a chair with arms (e.g., wheelchair, bedside commode, etc,.)?: Unable Help needed moving to and from a bed to chair (including a wheelchair)?: A Lot Help needed walking in hospital room?: A Lot Help needed climbing 3-5 steps with a railing? : Total 6 Click Score: 8    End of Session Equipment Utilized During Treatment: Gait belt Activity Tolerance: Patient limited by fatigue Patient left: in chair;with call bell/phone within reach;with chair alarm set   PT Visit Diagnosis: Muscle weakness (generalized) (M62.81);Difficulty in walking, not elsewhere classified (R26.2);History of falling (Z91.81)    Time: [id number] PT Time Calculation (min) (ACUTE ONLY): 26 min   Charges:   PT Evaluation $PT Eval Moderate Complexity: 1 Mod            Rebeca AlertJannie Janessa Mickle, MPT Pager: [phone]

## 2018-07-08 NOTE — Care Management Note (Signed)
Case Management Note  Patient Details  Name: Jeremiah [name] MRN: [id number] Date of Birth: 03-26-1946  Subjective/Objective:  Admitted w/c diff colitis. From home w/spouse. Active w/[facility] rep jermaine following. PT recc SNF-spoke to patient he is in agreement to talking to CSW-notified. Also spoke to Jolaine ArtistSon [name] on phone-he agrees to SNF but says its patient decision. Await outcome from CSW.                  Action/Plan:d/c plan SNF   Expected Discharge Date:  (unknown)               Expected Discharge Plan:  Skilled Nursing Facility  In-House Referral:     Discharge planning Services  CM Consult  Post Acute Care Choice:    Choice offered to:     DME Arranged:    DME Agency:     HH Arranged:    HH Agency:  [facility]  Status of Service:  In process, will continue to follow  If discussed at Long Length of Stay Meetings, dates discussed:    Additional Comments:  Lanier ClamMahabir, Amery Minasyan, RN 07/08/2018, 12:39 PM

## 2018-07-08 NOTE — Progress Notes (Addendum)
[facility]    Date of Admission:  07/07/2018   Total days of antibiotics 2   ID: Jeremiah [name] is a 72 y.o. male with  Recurrent cdifficile Active Problems:   PVD (peripheral vascular disease) (HCC)   Diabetes mellitus without complication (HCC)   Hypertension   Normocytic anemia   Clostridium difficile colitis   Hypokalemia   HLD (hyperlipidemia)   Pressure injury of skin    Subjective: Afebrile today, leukocytosis improved- had 2 BM since this morning  cdiff + and blood cx showing GPC in 1 bottle  Medications:  . aspirin EC  81 mg Oral Daily  . brimonidine  1 drop Both Eyes BID  . enoxaparin (LOVENOX) injection  40 mg Subcutaneous Q24H  . insulin aspart  0-5 Units Subcutaneous QHS  . insulin aspart  0-9 Units Subcutaneous TID WC  . losartan  100 mg Oral Daily  . metoprolol succinate  50 mg Oral Daily  . potassium chloride  40 mEq Oral Q4H  . simvastatin  40 mg Oral Daily  . timolol  1 drop Both Eyes BID  . vancomycin  125 mg Oral QID   Followed by  . [START ON 07/21/2018] vancomycin  125 mg Oral BID   Followed by  . [START ON 07/29/2018] vancomycin  125 mg Oral Daily   Followed by  . [START ON 08/05/2018] vancomycin  125 mg Oral QODAY   Followed by  . [START ON 09/02/2018] vancomycin  125 mg Oral Q3 days    Objective: Vital signs in last 24 hours: Temp:  [98.6 F (37 C)-101 F (38.3 C)] 99.6 F (37.6 C) (08/15 0550) Pulse Rate:  [75-93] 76 (08/15 0550) Resp:  [14-21] 16 (08/15 0550) BP: (116-158)/(60-85) 143/63 (08/15 0550) SpO2:  [94 %-99 %] 98 % (08/15 0550) Weight:  [69.4 kg-70.2 kg] 70.2 kg (08/15 0550)     Lab Results Recent Labs    07/07/18 0850 07/08/18 0518  WBC 24.9* 15.7*  HGB 11.6* 9.5*  HCT 35.4* 29.2*  NA 141 139  K 3.0* 3.2*  CL 100 103  CO2 28 27  BUN 9 13  CREATININE 1.10 1.18   Liver Panel Recent Labs    07/07/18 0850 07/08/18 0518  PROT 8.0 6.2*  ALBUMIN 2.9* 2.1*  AST 14* 11*  ALT 13  11  ALKPHOS 101 83  BILITOT 1.0 0.5    Microbiology: Blood cx 1 of 4 bottles with CoNS Studies/Results: Ct Head Wo Contrast  Result Date: 07/07/2018 CLINICAL DATA:  Altered level of consciousness EXAM: CT HEAD WITHOUT CONTRAST TECHNIQUE: Contiguous axial images were obtained from the base of the skull through the vertex without intravenous contrast. COMPARISON:  07/17/2016 FINDINGS: Brain: There is atrophy and chronic small vessel disease changes. No acute intracranial abnormality. Specifically, no hemorrhage, hydrocephalus, mass lesion, acute infarction, or significant intracranial injury. Vascular: No hyperdense vessel or unexpected calcification. Skull: No acute calvarial abnormality. Sinuses/Orbits: Visualized paranasal sinuses and mastoids clear. Orbital soft tissues unremarkable. Other: None IMPRESSION: No acute intracranial abnormality. Atrophy, chronic microvascular disease. Electronically Signed   By: Charlett NoseKevin  Dover M.D.   On: 07/07/2018 11:00   Ct Abdomen Pelvis W Contrast  Result Date: 07/07/2018 CLINICAL DATA:  72 year old male with continued abdominal pain and diarrhea. History of acute cholecystitis with attempted cholecystectomy. Percutaneous cholecystostomy tube placed and recently removed. EXAM: CT ABDOMEN AND PELVIS WITH CONTRAST TECHNIQUE: Multidetector CT imaging of the abdomen and pelvis was performed using the standard  protocol following bolus administration of intravenous contrast. CONTRAST:  100mL ISOVUE-300 IOPAMIDOL (ISOVUE-300) INJECTION 61% COMPARISON:  06/04/2018 and prior CTs FINDINGS: Lower chest: A small to moderate RIGHT pleural effusion and trace LEFT pleural effusion are again noted. Coronary artery calcifications and UPPER limits normal heart size noted. Hepatobiliary: The liver is unremarkable. Ill-defined soft tissue/stranding in the region of the gallbladder again noted. No biliary dilatation. Pancreas: Unremarkable Spleen: Unremarkable Adrenals/Urinary Tract:  Kidneys, adrenal glands and bladder are unremarkable. Stomach/Bowel: Possible mild circumferential wall thickening of the descending and sigmoid colon noted and may represent colitis. There is no evidence of bowel obstruction. There is mild wall enhancement of the appendix which is borderline and size but does not appear significantly changed in size when compared to 01/30/2018 CT. No evidence of abscess. Vascular/Lymphatic: Aortic atherosclerosis. No enlarged abdominal or pelvic lymph nodes. Reproductive: Prostate is unremarkable. Other: A tiny amount of perihepatic fluid is unchanged. No other ascites identified. The percutaneous abdominal catheter has been removed. Catheter tract is identified. Musculoskeletal: No acute or suspicious bony lesions. IMPRESSION: 1. Possible mild circumferential wall thickening of the descending and sigmoid colon-question colitis. No evidence of bowel obstruction. 2. Interval removal of abdominal drainage catheter. No evidence of abscess. 3. Small to moderate RIGHT pleural effusion and trace LEFT pleural effusion again noted. 4. No other significant change. 5. Coronary artery and aortic Atherosclerosis (ICD10-I70.0). Electronically Signed   By: Harmon PierJeffrey  Hu M.D.   On: 07/07/2018 11:14   Dg Chest Port 1 View  Result Date: 07/07/2018 CLINICAL DATA:  Altered mental status EXAM: PORTABLE CHEST 1 VIEW COMPARISON:  May 18, 2018 FINDINGS: There are small pleural effusions bilaterally with bibasilar atelectasis. There is mild left midlung atelectasis. There is no consolidation. Heart is upper normal in size with pulmonary vascularity normal. No adenopathy. There is aortic atherosclerosis. There is calcification in the proximal humeral diaphysis on the right. IMPRESSION: Small pleural effusions bilaterally, slightly larger on the right than on the left. Bibasilar and left mid lung atelectasis. No consolidation. Stable cardiac silhouette. There is aortic atherosclerosis. There is either a  small enchondroma or bone infarct in the proximal right humeral diaphysis, stable. Aortic Atherosclerosis (ICD10-I70.0). Electronically Signed   By: Bretta BangWilliam  Woodruff III M.D.   On: 07/07/2018 08:00     Assessment/Plan:  Recurrent cdifficile = continue on oral vancomycin and can d/c iv metronidazole after today.   Bacteremia = is CoNS only in 1 bottle of 4. Will d/c meropenem - thought to be a contaminant  Asymptomatic bacturia = patient denies recent dysuria or urinary frequency prior to admit.Marland Kitchen.less likely to have UTI- though he is colonized with ESBL citrobacter  [facility] Hospital[facility]ynthia Sachit [facility] Cell: [phone] Pager: [phone]  07/08/2018, 10:32 AM

## 2018-07-08 NOTE — Evaluation (Signed)
Occupational Therapy Evaluation Patient Details Name: Curley SpiceWalter L Grunert MRN: [id number] DOB: [birth year] Today's Date: 07/08/2018    History of Present Illness 72 yo male admitted with C diff, UTI, confusion, weakness. Hx of gout, cholectystectomy, PVD, DM, CHF   Clinical Impression   Pt admitted with the above diagnoses and presents with below problem list. Pt will benefit from continued acute OT to address the below listed deficits and maximize independence with basic ADLs prior to d/c to next venue. At baseline pt does not need physical assist but recently has been needing assist with bathing/dressing as he has felt weaker. Pt is currently mod to max +2 physical assist with LB ADLs and functional transfers, mod A for UB ADLs. Pivotal distance this session 2/2 generalized weakness. Son present throughout session.      Follow Up Recommendations  SNF    Equipment Recommendations  Other (comment)(defer to next venue)    Recommendations for Other Services       Precautions / Restrictions Precautions Precautions: Fall Precaution Comments: incontinence Restrictions Weight Bearing Restrictions: No      Mobility Bed Mobility Overal bed mobility: Needs Assistance Bed Mobility: Supine to Sit     Supine to sit: Mod assist;HOB elevated     General bed mobility comments: Assist for trunk and bil LEs. Utilized bedpad to aid with scooting, positioning. Increased time. Pt had some difficulty processing task.   Transfers Overall transfer level: Needs assistance Equipment used: Rolling walker (2 wheeled) Transfers: Sit to/from [employer] CorporationStand;Stand Pivot Transfers Sit to Stand: Mod assist;+2 physical assistance;+2 safety/equipment Stand pivot transfers: Mod assist;+2 physical assistance;+2 safety/equipment       General transfer comment: Multimodal cueing required. Sit to stand x 2. Assist to rise, stabilize, control descent. Cues for safety, posture, proper use of RW. Increased time required.  Stand pivot, bed to recliner, with RW.     Balance Overall balance assessment: Needs assistance;History of Falls Sitting-balance support: Bilateral upper extremity supported;Feet supported Sitting balance-Leahy Scale: Fair Sitting balance - Comments: fatigue   Standing balance support: Bilateral upper extremity supported Standing balance-Leahy Scale: Poor                             ADL either performed or assessed with clinical judgement   ADL Overall ADL's : Needs assistance/impaired Eating/Feeding: Set up;Sitting;Minimal assistance   Grooming: Moderate assistance;Sitting   Upper Body Bathing: Maximal assistance;Sitting   Lower Body Bathing: Maximal assistance;+2 for physical assistance;Sit to/from stand   Upper Body Dressing : Maximal assistance;Sitting   Lower Body Dressing: Maximal assistance;+2 for physical assistance;Sit to/from stand   Toilet Transfer: +2 for physical assistance;RW;Stand-pivot;Moderate assistance;+2 for safety/equipment   Toileting- Clothing Manipulation and Hygiene: Maximal assistance;+2 for physical assistance;Sit to/from stand Toileting - Clothing Manipulation Details (indicate cue type and reason): 1 person to steady, second person to complete tasks   Tub/Shower Transfer Details (indicate cue type and reason): NT- unable to step over tub bench height   General ADL Comments: Pt completed bed mobility, pericare in standing, simulated toilet transfer (EOB>recliner).      Vision         Perception     Praxis      Pertinent Vitals/Pain Pain Assessment: Faces Faces Pain Scale: Hurts little more Pain Location: heels Pain Descriptors / Indicators: Sore Pain Intervention(s): Limited activity within patient's tolerance;Monitored during session;Repositioned     Hand Dominance Right   Extremity/Trunk Assessment Upper Extremity Assessment Upper Extremity Assessment: Generalized  weakness   Lower Extremity Assessment Lower  Extremity Assessment: Defer to PT evaluation   Cervical / Trunk Assessment Cervical / Trunk Assessment: Kyphotic   Communication Communication Communication: No difficulties   Cognition Arousal/Alertness: Awake/alert Behavior During Therapy: Flat affect Overall Cognitive Status: Impaired/Different from baseline Area of Impairment: Orientation;Problem solving                 Orientation Level: Disoriented to;Place;Time;Situation           Problem Solving: Slow processing;Decreased initiation;Difficulty sequencing;Requires verbal cues;Requires tactile cues     General Comments  Son present throughout session    Exercises     Shoulder Instructions      Home Living Family/patient expects to be discharged to:: Unsure Living Arrangements: Spouse/significant other Available Help at Discharge: Family;Available 24 hours/day Type of Home: House Home Access: Stairs to enter [employer] of Steps: 4 Entrance Stairs-Rails: Right;Left Home Layout: One level               Home Equipment: Walker - 2 wheels;Shower seat   Additional Comments:       Prior Functioning/Environment Level of Independence: Needs assistance  Gait / Transfers Assistance Needed: assist for OOB/mobility. Used RW for short distance ambulation ADL's / Homemaking Assistance Needed: baseline does not need physical assist but has been needing assist with bathing/dressing as he has gotten weaker recently.    Comments: uses RW for all mobility and wife helps as needed        OT Problem List: Decreased strength;Decreased activity tolerance;Impaired balance (sitting and/or standing);Decreased cognition;Decreased knowledge of use of DME or AE;Decreased knowledge of precautions;Pain      OT Treatment/Interventions: Self-care/ADL training;Energy conservation;DME and/or AE instruction;Therapeutic activities;Patient/family education;Balance training    OT Goals(Current goals can be found in  the care plan section) Acute Rehab OT Goals Patient Stated Goal: per son, to get stronger OT Goal Formulation: With patient/family Time For Goal Achievement: 07/22/18 Potential to Achieve Goals: Good ADL Goals Pt Will Perform Grooming: with set-up;sitting Pt Will Perform Upper Body Bathing: with set-up;sitting Pt Will Perform Lower Body Bathing: with min assist;sit to/from stand Pt Will Perform Upper Body Dressing: with set-up;sitting Pt Will Perform Lower Body Dressing: with min assist;sit to/from stand Pt Will Transfer to Toilet: with min assist;ambulating;bedside commode Pt Will Perform Toileting - Clothing Manipulation and hygiene: with min assist;sit to/from stand Additional ADL Goal #1: Pt will complete bed mobility at min guard level to prepare for OOB ADLs.  OT Frequency: Min 2X/week   Barriers to D/C:            Co-evaluation PT/OT/SLP Co-Evaluation/Treatment: Yes Reason for Co-Treatment: For patient/therapist safety;To address functional/ADL transfers   OT goals addressed during session: ADL's and self-care      AM-PAC PT "6 Clicks" Daily Activity     Outcome Measure Help from another person eating meals?: None Help from another person taking care of personal grooming?: A Little Help from another person toileting, which includes using toliet, bedpan, or urinal?: A Lot Help from another person bathing (including washing, rinsing, drying)?: A Lot Help from another person to put on and taking off regular upper body clothing?: A Little Help from another person to put on and taking off regular lower body clothing?: A Lot 6 Click Score: 16   End of Session Equipment Utilized During Treatment: Gait belt;Rolling walker Nurse Communication: Mobility status  Activity Tolerance: Patient limited by fatigue Patient left: in chair;with call bell/phone within reach;with chair alarm set;with family/visitor  present  OT Visit Diagnosis: Unsteadiness on feet (R26.81);Muscle  weakness (generalized) (M62.81);History of falling (Z91.81);Pain;Other symptoms and signs involving cognitive function                Time: [id number] OT Time Calculation (min): 25 min Charges:  OT General Charges $OT Visit: 1 Visit OT Evaluation $OT Eval Moderate Complexity: 1 Mod    Pilar GrammesMathews, Dakari Cregger H 07/08/2018, 11:37 AM

## 2018-07-08 NOTE — Care Management Note (Signed)
Case Management Note  Patient Details  Name: Jeremiah [name] MRN: [id number] Date of Birth: 28-Jun-1946  Subjective/Objective: Patient & spouse pleasantly declines SNF.Active w/[facility] HHRN/HHPT-rep Jermaine aware-recc HHRN/PT/OT/AIDE/CSW. Spouse provided transp, will make own pcp f/u appt.                 Action/Plan:d/c plan home w/HHC.   Expected Discharge Date:  (unknown)               Expected Discharge Plan:  Skilled Nursing Facility  In-House Referral:     Discharge planning Services  CM Consult  Post Acute Care Choice:    Choice offered to:     DME Arranged:    DME Agency:     HH Arranged:    HH Agency:  [facility]  Status of Service:  In process, will continue to follow  If discussed at Long Length of Stay Meetings, dates discussed:    Additional Comments:  Lanier ClamMahabir, Kevion Fatheree, RN 07/08/2018, 12:56 PM

## 2018-07-08 NOTE — Progress Notes (Signed)
Initial Nutrition Assessment  DOCUMENTATION CODES:   (Will assess for malnutrition at follow-up. )  INTERVENTION:  - Will order Ensure Enlive once/day, this supplement provides 350 kcal and 20 grams of protein. - Will order Boost Breeze BID, each supplement provides 250 kcal and 9 grams of protein - Will order daily multivitamin with minerals.  - Continue to encourage PO intakes.   NUTRITION DIAGNOSIS:   Inadequate oral intake related to acute illness, poor appetite, lethargy/confusion as evidenced by per patient/family report.  GOAL:   Patient will meet greater than or equal to 90% of their needs  MONITOR:   PO intake, Supplement acceptance, Weight trends, Labs, I & O's  REASON FOR ASSESSMENT:   Consult Assessment of nutrition requirement/status  ASSESSMENT:   72 y.o. 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 [facility] of significant diarrhea and confusion, per wife. Patient's wife stated that he was doing well until JP drain removal a few days PTA.  After drain removal patient, developed significant diarrhea which was very foul-smelling and liquidy. Patient then started becoming confused and had a difficult time communicating. Wife states that he has been doing okay but not eating very well and was concerned that he had all his loose diarrhea. Patient dx with recurrent c.diff.   BMI indicates normal weight. No intakes documented since admission. Per notes, PT and OT evaluated patient earlier today and recommended SNF but patient/wife refusing. Per CM note, plan for home with Home Health. Patient reports feeling somewhat better today, ongoing nausea but no vomiting today. He is now a/o x3. He has had BM x1 today. He reports that cholecystectomy at the end of June and had JP drain  removed a few days ago which is when N/V/D began and PO intakes became very poor. Patient unable to consume solid foods for several days d/t these symptoms and also d/t being confused.   NFPE unable to be done at this time but will attempt at follow-up. Per review, patient has lost 18 lb (10% body weight) in the past 1 month. Will need to monitor weight trends throughout hospitalization; suspect at least part of this is d/t fluid losses/dehydration with V/D. Highly suspect malnutrition but unable to confirm at this time.    Medications reviewed; sliding scale Novolog, 2 g IV Mg sulfate x1 run today, 40 mEq oral KCl x2 doses today. Labs reviewed; CBGs: 147 and 180 mg/dL today, K: 3.2 mmol/L, Ca: 8.2 mg/dL, Mg: 1.5 mg/dL. IVF: NS @ 75 mL/hr       NUTRITION - FOCUSED PHYSICAL EXAM:  Unable to complete at this time; will attempt at follow-up.  Diet Order:   Diet Order            Diet heart healthy/carb modified Room service appropriate? Yes; Fluid consistency: Thin  Diet effective now              EDUCATION NEEDS:   Not appropriate for education at this time  Skin:  Skin Assessment: Skin Integrity Issues: Skin Integrity Issues:: DTI DTI: bilateral heels  Last BM:  8/15  Height:   Ht Readings from Last 1 Encounters:  07/07/18 5\' 9"  (1.753 m)    Weight:   Wt Readings from Last 1 Encounters:  07/08/18 70.2 kg    Ideal Body Weight:  72.73 kg  BMI:  Body  mass index is 22.85 kg/m.  Estimated Nutritional Needs:   Kcal:  [id number]  Protein:  70-85 grams  Fluid:  >/= 2 L/day     Trenton GammonJessica Tommy Goostree, MS, RD, LDN, [facility] Inpatient Clinical Dietitian Pager # [phone] After hours/weekend pager # [phone]

## 2018-07-09 DIAGNOSIS — R159 Full incontinence of feces: Secondary | ICD-10-CM

## 2018-07-09 DIAGNOSIS — D649 Anemia, unspecified: Secondary | ICD-10-CM

## 2018-07-09 LAB — BASIC METABOLIC PANEL
Anion gap: 6 (ref 5–15)
BUN: 10 mg/dL (ref 8–23)
CO2: 26 mmol/L (ref 22–32)
Calcium: 8.1 mg/dL — ABNORMAL LOW (ref 8.9–10.3)
Chloride: 107 mmol/L (ref 98–111)
Creatinine, Ser: 1 mg/dL (ref 0.61–1.24)
GFR calc Af Amer: >60 mL/min (ref >60)
GFR calc non Af Amer: >60 mL/min (ref >60)
Glucose, Bld: 95 mg/dL (ref 70–99)
Potassium: 3.6 mmol/L (ref 3.5–5.1)
Sodium: 139 mmol/L (ref 135–145)

## 2018-07-09 LAB — CBC WITH DIFFERENTIAL/PLATELET
Basophils Absolute: 0 10*3/uL (ref 0.0–0.1)
Basophils Relative: 0 %
Eosinophils Absolute: 0.8 10*3/uL — ABNORMAL HIGH (ref 0.0–0.7)
Eosinophils Relative: 9 %
HCT: 26.3 % — ABNORMAL LOW (ref 39.0–52.0)
Hemoglobin: 8.8 g/dL — ABNORMAL LOW (ref 13.0–17.0)
Lymphocytes Relative: 28 %
Lymphs Abs: 2.6 10*3/uL (ref 0.7–4.0)
MCH: 27.3 pg (ref 26.0–34.0)
MCHC: 33.5 g/dL (ref 30.0–36.0)
MCV: 81.7 fL (ref 78.0–100.0)
Monocytes Absolute: 1 10*3/uL (ref 0.1–1.0)
Monocytes Relative: 10 %
Neutro Abs: 4.9 10*3/uL (ref 1.7–7.7)
Neutrophils Relative %: 53 %
Platelets: 245 10*3/uL (ref 150–400)
RBC: 3.22 MIL/uL — ABNORMAL LOW (ref 4.22–5.81)
RDW: 16.9 % — ABNORMAL HIGH (ref 11.5–15.5)
WBC: 9.3 10*3/uL (ref 4.0–10.5)

## 2018-07-09 LAB — GLUCOSE, CAPILLARY
Glucose-Capillary: 102 mg/dL — ABNORMAL HIGH (ref 70–99)
Glucose-Capillary: 226 mg/dL — ABNORMAL HIGH (ref 70–99)
Glucose-Capillary: 75 mg/dL (ref 70–99)
Glucose-Capillary: 202 mg/dL — ABNORMAL HIGH (ref 70–99)

## 2018-07-09 LAB — MAGNESIUM: Magnesium: 1.9 mg/dL (ref 1.7–2.4)

## 2018-07-09 MED ORDER — POTASSIUM CHLORIDE CRYS ER 20 MEQ PO TBCR
40.0000 meq | EXTENDED_RELEASE_TABLET | Freq: Once | ORAL | Status: AC
  Administered 2018-07-09: 40 meq via ORAL
  Filled 2018-07-09: qty 2

## 2018-07-09 NOTE — Progress Notes (Signed)
[facility]    Date of Admission:  07/07/2018   Total days of antibiotics 3        Day 3 oral vanco           ID: [name] is a 72 y.o. male with  Active Problems:   PVD (peripheral vascular disease) (HCC)   Diabetes mellitus without complication (HCC)   Hypertension   Normocytic anemia   Clostridium difficile colitis   Hypokalemia   HLD (hyperlipidemia)   Pressure injury of skin    Subjective: Afebrile, leukocytosis resolved on morning labs. Has had 2 loose stool, one-incontinent thus far today  Medications:  . aspirin EC  81 mg Oral Daily  . brimonidine  1 drop Both Eyes BID  . enoxaparin (LOVENOX) injection  40 mg Subcutaneous Q24H  . feeding supplement  1 Container Oral BID BM  . feeding supplement (ENSURE ENLIVE)  237 mL Oral Q24H  . insulin aspart  0-5 Units Subcutaneous QHS  . insulin aspart  0-9 Units Subcutaneous TID WC  . losartan  100 mg Oral Daily  . metoprolol succinate  50 mg Oral Daily  . multivitamin with minerals  1 tablet Oral Daily  . simvastatin  40 mg Oral Daily  . timolol  1 drop Both Eyes BID  . vancomycin  125 mg Oral QID   Followed by  . [START ON 07/21/2018] vancomycin  125 mg Oral BID   Followed by  . [START ON 07/29/2018] vancomycin  125 mg Oral Daily   Followed by  . [START ON 08/05/2018] vancomycin  125 mg Oral QODAY   Followed by  . [START ON 09/02/2018] vancomycin  125 mg Oral Q3 days    Objective: Vital signs in last 24 hours: Temp:  [97.9 F (36.6 C)-99.8 F (37.7 C)] 98.2 F (36.8 C) (08/16 1228) Pulse Rate:  [51-68] 53 (08/16 1228) Resp:  [16-18] 18 (08/16 1228) BP: (131-144)/(57-74) 143/57 (08/16 1228) SpO2:  [96 %-99 %] 99 % (08/16 1228) Weight:  [72.6 kg] 72.6 kg (08/16 [id number]) Physical Exam  Constitutional: He is oriented to person, place. He appears well-developed and well-nourished. No distress.  HENT:  Mouth/Throat: Oropharynx is clear and moist. No oropharyngeal exudate.    Cardiovascular: Normal rate, regular rhythm and normal heart sounds. Exam reveals no gallop and no friction rub.  No murmur heard.  Pulmonary/Chest: Effort normal and breath sounds normal. No respiratory distress. He has no wheezes.  Abdominal: Soft. Bowel sounds are normal. He exhibits no distension. There is no tenderness.  Lymphadenopathy:  He has no cervical adenopathy.  Neurological: He is alert and oriented to person, place, and time.  Skin: Skin is warm and dry. No rash noted. No erythema.  Psychiatric: He has a normal mood and affect. His behavior is normal.    Lab Results Recent Labs    07/08/18 0518 07/09/18 0523  WBC 15.7* 9.3  HGB 9.5* 8.8*  HCT 29.2* 26.3*  NA 139 139  K 3.2* 3.6  CL 103 107  CO2 27 26  BUN 13 10  CREATININE 1.18 1.00   Liver Panel Recent Labs    07/07/18 0850 07/08/18 0518  PROT 8.0 6.2*  ALBUMIN 2.9* 2.1*  AST 14* 11*  ALT 13 11  ALKPHOS 101 83  BILITOT 1.0 0.5    Microbiology: reviewed Studies/Results: No results found.   Assessment/Plan: Recurrent cdifficile colitis =   Recommend to treat with oral vancomycin taper and we will  have him come to ID clinic to discuss fecal transplantation  Oral vancomycin taper 125mg  QID x 14d including what he has received, followed by 125mg  TID x 7d, then 125mg  BID x 7 day, then 125mg  daily x 7 days.  Leukocytosis = improved,resolved  Diarrhea = will reassess on Monday to see if still having incontinence. May add low dose immodium to see if it helps his symptoms  [facility] HospitalCynthia Jebadiah [facility] Cell: [phone] Pager: [phone]  07/09/2018, 3:40 PM

## 2018-07-09 NOTE — Progress Notes (Addendum)
PROGRESS NOTE    Jeremiah TERPSTRA  JWJ:[id number] DOB: 24-Apr-1946 DOA: 07/07/2018 PCP: Rinaldo Cloud, MD    Brief Narrative:  72 year old male who presented with diarrhea and confusion.  He does have significant past medical history for complicated cholecystectomy status post drain placement, gout, hypertension, peripheral vascular disease, type 2 diabetes mellitus and recurrent C. difficile diarrhea.  About 2 days prior to hospitalization he completed antibiotic therapy and his biliary drain was removed.  Since then he developed persistent, profuse diarrhea, associated with confusion.  He tested positive for urinalysis as an outpatient with Citrobacter koseri.  She will physical examination his temperature was 101, blood pressure 136/85, heart rate 81, respiratory rate 17, oxygen saturation 94%.  His lungs had decreased breath sounds bilaterally, heart S1-S2 present rhythmic tachycardic, the abdomen was soft, nontender, nondistended, no lower extremity edema.  Sodium 141, potassium 3.0, chloride 100, bicarb 28, glucose 206, BUN 9, creatinine 1.1, white count 24.9, hemoglobin 11.6, hematocrit 35.4, platelets 301.  Urinalysis with specific gravity 1.013, 100 protein, 0-5 white cells.  Stool tested positive for C. Difficile.  CT of the abdomen and pelvis with with possible mild circumferential wall thickening of the descending and sigmoid colon.  Small to moderate right pleural effusion and trace left pleural effusion.  Head CT negative for acute changes.  Chest x-ray with bilateral effusions more right and left.  With sinus rhythm, normal axis, normal intervals, positive PACs.  Patient was admitted to the hospital with working diagnosis of sepsis due to C. difficile colitis.  To rule out underlying urinary tract infection.   Assessment & Plan:   Active Problems:   PVD (peripheral vascular disease) (HCC)   Diabetes mellitus without complication (HCC)   Hypertension   Normocytic anemia  Clostridium difficile colitis   Hypokalemia   HLD (hyperlipidemia)   Pressure injury of skin   1. Recurrent C diff colitis/ diarrhea, present on admission. Complicated with hypokalemia and hypomagnesemia. On oral vancomycin, tolerating well, continue to have diarrhea but has improved in intensity. K up to 3,6 and Mg at 1,9, will continue K correction with Kcl 40 meq, will follow on renal panel in am. Cell count stable with wbc at 9,3. Patient has remained afebrile. Patient will need vancomycin long taper at discharge due to recurrent c diff diarrhea.   2. Urine tract infection. (present on admission). Antibiotic therapy has been discontinued, cultures continue to be no growth.   3. T2DM. Glucose cover and monitoring with insulin sliding scale, oral po intake continue to improve, capillary glucose 180, 132, 88, 102, 226. Holding in basal insulin for now due to risk of hypoglycemia.    4. Diastolic CHF. Improved oral intake, will hold on IV fluids, will continue metoprolol and losartan.   5. HTN.  Blood pressure control with losartan and metoprolol.   6. Calorie protein malnutrition. Will continue nutritional supplements.   7. Deep tissue ulcers bilateral heals. (present on admission) Continue local wound care.   DVT prophylaxis: enoxaparin   Code Status: full Family Communication: no family at the bedside  Disposition Plan/ discharge barriers: Pending clinical improvement// SNF   Consultants:   ID  Procedures:     Antimicrobials   Subjective: Had 4 bowel movements over night, increased appetite, no nausea or vomiting, no fevers or chills. Very weak and deconditioned.   Objective: Vitals:   07/08/18 1217 07/08/18 2226 07/09/18 0639 07/09/18 0918  BP: 140/63 131/65 140/66 (!) 144/74  Pulse: 64 60 (!) 51 68  Resp:  16 16 16    Temp: 99.2 F (37.3 C) 99.8 F (37.7 C) 97.9 F (36.6 C)   TempSrc: Oral Oral Oral   SpO2: 97% 96% 99%   Weight:   72.6 kg   Height:         Intake/Output Summary (Last 24 hours) at 07/09/2018 0935 Last data filed at 07/09/2018 0600 Gross per 24 hour  Intake 1682.09 ml  Output -  Net 1682.09 ml   Filed Weights   07/07/18 1609 07/08/18 0550 07/09/18 0639  Weight: 69.4 kg 70.2 kg 72.6 kg    Examination:   General: deconditioned  Neurology: Awake and alert, non focal  E ENT: positive pallor, no icterus, oral mucosa moist Cardiovascular: No JVD. S1-S2 present, rhythmic, no gallops, rubs, or murmurs. No lower extremity edema. Pulmonary: positive breath sounds bilaterally, adequate air movement, no wheezing, rhonchi or rales. Gastrointestinal. Abdomen mild distended, no organomegaly, non tender, no rebound or guarding Skin. No rashes Musculoskeletal: no joint deformities     Data Reviewed: I have personally reviewed following labs and imaging studies  CBC: Recent Labs  Lab 07/07/18 0850 07/08/18 0518 07/09/18 0523  WBC 24.9* 15.7* 9.3  NEUTROABS 21.5* 12.1* 4.9  HGB 11.6* 9.5* 8.8*  HCT 35.4* 29.2* 26.3*  MCV 81.8 81.8 81.7  PLT 301 255 245   Basic Metabolic Panel: Recent Labs  Lab 07/07/18 0850 07/08/18 0518 07/09/18 0523  NA 141 139 139  K 3.0* 3.2* 3.6  CL 100 103 107  CO2 28 27 26   GLUCOSE 206* 143* 95  BUN 9 13 10   CREATININE 1.10 1.18 1.00  CALCIUM 9.1 8.2* 8.1*  MG  --  1.5* 1.9  PHOS  --  2.5  --    GFR: Estimated Creatinine Clearance: 64.8 mL/min (by C-G formula based on SCr of 1 mg/dL). Liver Function Tests: Recent Labs  Lab 07/07/18 0850 07/08/18 0518  AST 14* 11*  ALT 13 11  ALKPHOS 101 83  BILITOT 1.0 0.5  PROT 8.0 6.2*  ALBUMIN 2.9* 2.1*   No results for input(s): LIPASE, AMYLASE in the last 168 hours. No results for input(s): AMMONIA in the last 168 hours. Coagulation Profile: No results for input(s): INR, PROTIME in the last 168 hours. Cardiac Enzymes: No results for input(s): CKTOTAL, CKMB, CKMBINDEX, TROPONINI in the last 168 hours. BNP (last 3 results) No  results for input(s): PROBNP in the last 8760 hours. HbA1C: No results for input(s): HGBA1C in the last 72 hours. CBG: Recent Labs  Lab 07/08/18 0814 07/08/18 1200 07/08/18 1648 07/08/18 2222 07/09/18 0751  GLUCAP 147* 180* 132* 88 102*   Lipid Profile: No results for input(s): CHOL, HDL, LDLCALC, TRIG, CHOLHDL, LDLDIRECT in the last 72 hours. Thyroid Function Tests: Recent Labs    07/08/18 0518  TSH 0.264*   Anemia Panel: No results for input(s): VITAMINB12, FOLATE, FERRITIN, TIBC, IRON, RETICCTPCT in the last 72 hours.    Radiology Studies: I have reviewed all of the imaging during this hospital visit personally     Scheduled Meds: . aspirin EC  81 mg Oral Daily  . brimonidine  1 drop Both Eyes BID  . enoxaparin (LOVENOX) injection  40 mg Subcutaneous Q24H  . feeding supplement  1 Container Oral BID BM  . feeding supplement (ENSURE ENLIVE)  237 mL Oral Q24H  . insulin aspart  0-5 Units Subcutaneous QHS  . insulin aspart  0-9 Units Subcutaneous TID WC  . losartan  100 mg Oral Daily  . metoprolol  succinate  50 mg Oral Daily  . multivitamin with minerals  1 tablet Oral Daily  . potassium chloride  40 mEq Oral Once  . simvastatin  40 mg Oral Daily  . timolol  1 drop Both Eyes BID  . vancomycin  125 mg Oral QID   Followed by  . [START ON 07/21/2018] vancomycin  125 mg Oral BID   Followed by  . [START ON 07/29/2018] vancomycin  125 mg Oral Daily   Followed by  . [START ON 08/05/2018] vancomycin  125 mg Oral QODAY   Followed by  . [START ON 09/02/2018] vancomycin  125 mg Oral Q3 days   Continuous Infusions: . sodium chloride    . sodium chloride 75 mL/hr at 07/09/18 0600     LOS: 2 days        Mauricio Annett Gulaaniel Arrien, MD [facility] Pager [phone]

## 2018-07-10 LAB — BASIC METABOLIC PANEL
Anion gap: 6 (ref 5–15)
BUN: 7 mg/dL — ABNORMAL LOW (ref 8–23)
CO2: 25 mmol/L (ref 22–32)
Calcium: 8.2 mg/dL — ABNORMAL LOW (ref 8.9–10.3)
Chloride: 109 mmol/L (ref 98–111)
Creatinine, Ser: 0.84 mg/dL (ref 0.61–1.24)
GFR calc Af Amer: >60 mL/min (ref >60)
GFR calc non Af Amer: >60 mL/min (ref >60)
Glucose, Bld: 88 mg/dL (ref 70–99)
Potassium: 3.6 mmol/L (ref 3.5–5.1)
Sodium: 140 mmol/L (ref 135–145)

## 2018-07-10 LAB — CBC WITH DIFFERENTIAL/PLATELET
Basophils Absolute: 0 10*3/uL (ref 0.0–0.1)
Basophils Relative: 0 %
Eosinophils Absolute: 1.1 10*3/uL — ABNORMAL HIGH (ref 0.0–0.7)
Eosinophils Relative: 11 %
HCT: 28.3 % — ABNORMAL LOW (ref 39.0–52.0)
Hemoglobin: 9.3 g/dL — ABNORMAL LOW (ref 13.0–17.0)
Lymphocytes Relative: 28 %
Lymphs Abs: 2.8 10*3/uL (ref 0.7–4.0)
MCH: 26.3 pg (ref 26.0–34.0)
MCHC: 32.9 g/dL (ref 30.0–36.0)
MCV: 79.9 fL (ref 78.0–100.0)
Monocytes Absolute: 0.8 10*3/uL (ref 0.1–1.0)
Monocytes Relative: 8 %
Neutro Abs: 5.3 10*3/uL (ref 1.7–7.7)
Neutrophils Relative %: 53 %
Platelets: 250 10*3/uL (ref 150–400)
RBC: 3.54 MIL/uL — ABNORMAL LOW (ref 4.22–5.81)
RDW: 16.8 % — ABNORMAL HIGH (ref 11.5–15.5)
WBC: 10 10*3/uL (ref 4.0–10.5)

## 2018-07-10 LAB — CULTURE, BLOOD (ROUTINE X 2): Special Requests: ADEQUATE

## 2018-07-10 LAB — GLUCOSE, CAPILLARY
Glucose-Capillary: 95 mg/dL (ref 70–99)
Glucose-Capillary: 192 mg/dL — ABNORMAL HIGH (ref 70–99)
Glucose-Capillary: 174 mg/dL — ABNORMAL HIGH (ref 70–99)
Glucose-Capillary: 167 mg/dL — ABNORMAL HIGH (ref 70–99)
Glucose-Capillary: 159 mg/dL — ABNORMAL HIGH (ref 70–99)

## 2018-07-10 MED ORDER — POTASSIUM CHLORIDE CRYS ER 20 MEQ PO TBCR
40.0000 meq | EXTENDED_RELEASE_TABLET | Freq: Once | ORAL | Status: AC
  Administered 2018-07-10: 40 meq via ORAL
  Filled 2018-07-10: qty 2

## 2018-07-10 NOTE — Progress Notes (Addendum)
PROGRESS NOTE    Jeremiah [name]  ZOX:[id number] DOB: [birth year] DOA: 07/07/2018 PCP: Rinaldo CloudHarwani, Mohan, MD    Brief Narrative:  72 year old male who presented with diarrhea and confusion.  He does have significant past medical history for complicated cholecystectomy status post drain placement, gout, hypertension, peripheral vascular disease, type 2 diabetes mellitus and recurrent C. difficile diarrhea.  About 2 days prior to hospitalization he completed antibiotic therapy and his biliary drain was removed.  Since then he developed persistent, profuse diarrhea, associated with confusion.  He tested positive for urinalysis as an outpatient with Citrobacter koseri.  She will physical examination his temperature was 101, blood pressure 136/85, heart rate 81, respiratory rate 17, oxygen saturation 94%.  His lungs had decreased breath sounds bilaterally, heart S1-S2 present rhythmic tachycardic, the abdomen was soft, nontender, nondistended, no lower extremity edema.  Sodium 141, potassium 3.0, chloride 100, bicarb 28, glucose 206, BUN 9, creatinine 1.1, white count 24.9, hemoglobin 11.6, hematocrit 35.4, platelets 301.  Urinalysis with specific gravity 1.013, 100 protein, 0-5 white cells.  Stool tested positive for C. Difficile.  CT of the abdomen and pelvis with with possible mild circumferential wall thickening of the descending and sigmoid colon.  Small to moderate right pleural effusion and trace left pleural effusion.  Head CT negative for acute changes.  Chest x-ray with bilateral effusions more right and left.  With sinus rhythm, normal axis, normal intervals, positive PACs.  Patient was admitted to the hospital with working diagnosis of sepsis due to C. difficile colitis.  To rule out underlying urinary tract infection.    Assessment & Plan:   Active Problems:   PVD (peripheral vascular disease) (HCC)   Diabetes mellitus without complication (HCC)   Hypertension   Normocytic anemia  Clostridium difficile colitis   Hypokalemia   HLD (hyperlipidemia)   Pressure injury of skin   1. Recurrent C diff colitis/ diarrhea, present on admission. Complicated with hypokalemia and hypomagnesemia. Tolerating well oral vancomycin, will discharge patient on taper regimen due to recurrent disease. Wbc down to 10 and patient has been afebrile, tolerating po well, no nausea or vomiting, no abdominal pain. Reported improved diarrhea but not completely resolved. Continue K correction with Kcl, will follow renal panel in am.   2. Urine tract infection. Resolved.   3. T2DM.  Continue glucose cover and monitoring with insulin sliding scale, improved po intake, capillary glucose 226, 75, 202, 95, 192.   4. Diastolic CHF. Continue blood pressure monitoring, will hold on IV fluids for now, no signs of exacerbation. Continue b blocker and ARB.   5. HTN. Systolic blood pressure 150 to 160 mmHg, will continue losartan and metoprolol.  6. Right heal ulcer, present on admission. Stage 1 to 2, continue local wound care.   DVT prophylaxis: enoxaparin   Code Status: full Family Communication: I spoke with patient's son at the bedside and all questions were addressed.  Disposition Plan/ discharge barriers: Pending clinical improvement   Consultants:   ID  Procedures:     Antimicrobials:   Oral Vancomycin.  Subjective: Patient is feeling better, continue to improve diarrhea, but not completely resolved, no nausea or vomiting. He continue to decline SNF at discharge.   Objective: Vitals:   07/09/18 2030 07/09/18 2213 07/10/18 0405 07/10/18 0409  BP: (!) 143/60   (!) 160/69  Pulse: (!) 55   (!) 48  Resp: 16   16  Temp: 99.2 F (37.3 C) 98.7 F (37.1 C)  98.2 F (36.8 C)  TempSrc: Oral Oral  Oral  SpO2: 98%   99%  Weight:   73.5 kg   Height:        Intake/Output Summary (Last 24 hours) at 07/10/2018 1154 Last data filed at 07/10/2018 1033 Gross per 24 hour  Intake  989.66 ml  Output 775 ml  Net 214.66 ml   Filed Weights   07/08/18 0550 07/09/18 0639 07/10/18 0405  Weight: 70.2 kg 72.6 kg 73.5 kg    Examination:   General: Not in pain or dyspnea, deconditioned  Neurology: Awake and alert, non focal  E ENT: mild pallor, no icterus, oral mucosa moist Cardiovascular: No JVD. S1-S2 present, rhythmic, no gallops, rubs, or murmurs. No lower extremity edema. Pulmonary: vesicular breath sounds bilaterally, adequate air movement, no wheezing, rhonchi or rales. Gastrointestinal. Abdomen mild distended with no organomegaly, non tender, no rebound or guarding Skin. No rashes Musculoskeletal: no joint deformities     Data Reviewed: I have personally reviewed following labs and imaging studies  CBC: Recent Labs  Lab 07/07/18 0850 07/08/18 0518 07/09/18 0523 07/10/18 0511  WBC 24.9* 15.7* 9.3 10.0  NEUTROABS 21.5* 12.1* 4.9 5.3  HGB 11.6* 9.5* 8.8* 9.3*  HCT 35.4* 29.2* 26.3* 28.3*  MCV 81.8 81.8 81.7 79.9  PLT 301 255 245 250   Basic Metabolic Panel: Recent Labs  Lab 07/07/18 0850 07/08/18 0518 07/09/18 0523 07/10/18 0511  NA 141 139 139 140  K 3.0* 3.2* 3.6 3.6  CL 100 103 107 109  CO2 28 27 26 25   GLUCOSE 206* 143* 95 88  BUN 9 13 10  7*  CREATININE 1.10 1.18 1.00 0.84  CALCIUM 9.1 8.2* 8.1* 8.2*  MG  --  1.5* 1.9  --   PHOS  --  2.5  --   --    GFR: Estimated Creatinine Clearance: 77.2 mL/min (by C-G formula based on SCr of 0.84 mg/dL). Liver Function Tests: Recent Labs  Lab 07/07/18 0850 07/08/18 0518  AST 14* 11*  ALT 13 11  ALKPHOS 101 83  BILITOT 1.0 0.5  PROT 8.0 6.2*  ALBUMIN 2.9* 2.1*   No results for input(s): LIPASE, AMYLASE in the last 168 hours. No results for input(s): AMMONIA in the last 168 hours. Coagulation Profile: No results for input(s): INR, PROTIME in the last 168 hours. Cardiac Enzymes: No results for input(s): CKTOTAL, CKMB, CKMBINDEX, TROPONINI in the last 168 hours. BNP (last 3  results) No results for input(s): PROBNP in the last 8760 hours. HbA1C: No results for input(s): HGBA1C in the last 72 hours. CBG: Recent Labs  Lab 07/09/18 0751 07/09/18 1135 07/09/18 1704 07/09/18 2139 07/10/18 0754  GLUCAP 102* 226* 75 202* 95   Lipid Profile: No results for input(s): CHOL, HDL, LDLCALC, TRIG, CHOLHDL, LDLDIRECT in the last 72 hours. Thyroid Function Tests: Recent Labs    07/08/18 0518  TSH 0.264*   Anemia Panel: No results for input(s): VITAMINB12, FOLATE, FERRITIN, TIBC, IRON, RETICCTPCT in the last 72 hours.    Radiology Studies: I have reviewed all of the imaging during this hospital visit personally     Scheduled Meds: . aspirin EC  81 mg Oral Daily  . brimonidine  1 drop Both Eyes BID  . enoxaparin (LOVENOX) injection  40 mg Subcutaneous Q24H  . feeding supplement  1 Container Oral BID BM  . feeding supplement (ENSURE ENLIVE)  237 mL Oral Q24H  . insulin aspart  0-5 Units Subcutaneous QHS  . insulin aspart  0-9 Units Subcutaneous TID WC  .  losartan  100 mg Oral Daily  . metoprolol succinate  50 mg Oral Daily  . multivitamin with minerals  1 tablet Oral Daily  . potassium chloride  40 mEq Oral Once  . simvastatin  40 mg Oral Daily  . timolol  1 drop Both Eyes BID  . vancomycin  125 mg Oral QID   Followed by  . [START ON 07/21/2018] vancomycin  125 mg Oral BID   Followed by  . [START ON 07/29/2018] vancomycin  125 mg Oral Daily   Followed by  . [START ON 08/05/2018] vancomycin  125 mg Oral QODAY   Followed by  . [START ON 09/02/2018] vancomycin  125 mg Oral Q3 days   Continuous Infusions: . sodium chloride       LOS: 3 days        Janila Arrazola Annett Gulaaniel Kristian Mogg, MD [facility] Pager [phone]

## 2018-07-11 LAB — BASIC METABOLIC PANEL
Anion gap: 8 (ref 5–15)
BUN: 6 mg/dL — ABNORMAL LOW (ref 8–23)
CO2: 25 mmol/L (ref 22–32)
Calcium: 8.3 mg/dL — ABNORMAL LOW (ref 8.9–10.3)
Chloride: 105 mmol/L (ref 98–111)
Creatinine, Ser: 0.84 mg/dL (ref 0.61–1.24)
GFR calc Af Amer: >60 mL/min (ref >60)
GFR calc non Af Amer: >60 mL/min (ref >60)
Glucose, Bld: 130 mg/dL — ABNORMAL HIGH (ref 70–99)
Potassium: 3.9 mmol/L (ref 3.5–5.1)
Sodium: 138 mmol/L (ref 135–145)

## 2018-07-11 LAB — CBC WITH DIFFERENTIAL/PLATELET
Basophils Absolute: 0 10*3/uL (ref 0.0–0.1)
Basophils Relative: 0 %
Eosinophils Absolute: 0.8 10*3/uL — ABNORMAL HIGH (ref 0.0–0.7)
Eosinophils Relative: 7 %
HCT: 29.4 % — ABNORMAL LOW (ref 39.0–52.0)
Hemoglobin: 9.6 g/dL — ABNORMAL LOW (ref 13.0–17.0)
Lymphocytes Relative: 25 %
Lymphs Abs: 2.9 10*3/uL (ref 0.7–4.0)
MCH: 26.2 pg (ref 26.0–34.0)
MCHC: 32.7 g/dL (ref 30.0–36.0)
MCV: 80.3 fL (ref 78.0–100.0)
Monocytes Absolute: 1 10*3/uL (ref 0.1–1.0)
Monocytes Relative: 8 %
Neutro Abs: 7.2 10*3/uL (ref 1.7–7.7)
Neutrophils Relative %: 60 %
Platelets: 283 10*3/uL (ref 150–400)
RBC: 3.66 MIL/uL — ABNORMAL LOW (ref 4.22–5.81)
RDW: 16.8 % — ABNORMAL HIGH (ref 11.5–15.5)
WBC: 12 10*3/uL — ABNORMAL HIGH (ref 4.0–10.5)

## 2018-07-11 LAB — GLUCOSE, CAPILLARY: Glucose-Capillary: 125 mg/dL — ABNORMAL HIGH (ref 70–99)

## 2018-07-11 MED ORDER — ADULT MULTIVITAMIN W/MINERALS CH: 1.0000 | ORAL_TABLET | Freq: Every day | ORAL | 0 refills | Status: AC

## 2018-07-11 MED ORDER — ENSURE ENLIVE PO LIQD: 237.0000 mL | ORAL | 0 refills | Status: AC

## 2018-07-11 MED ORDER — VANCOMYCIN 50 MG/ML ORAL SOLUTION: 125.0000 mg | Freq: Four times a day (QID) | ORAL | 0 refills | Status: DC

## 2018-07-11 NOTE — Discharge Summary (Signed)
Physician Discharge Summary  Jeremiah [name] WUJ:[id number] DOB: 01-09-1946 DOA: 07/07/2018  PCP: Rinaldo Cloud, MD  Admit date: 07/07/2018 Discharge date: 07/11/2018  Admitted From: Home  Disposition:  Home   Recommendations for Outpatient Follow-up and new medication changes:  1. Follow up with Dr. Sharyn Lull in 7 days 2. Follow up with Infectious disease clinic in 2 weeks 3. Patient has been discharged with oral vancomycin taper for recurrent C. Diff. 4. Patient declined SNF, home health has been arranged.   Home Health: Yes   Equipment/Devices: No    Discharge Condition: Stable  CODE STATUS: full  Diet recommendation: Heart healthy and diabetic prudent.   Brief/Interim Summary: 72 year old male who presented with diarrhea and confusion. He does have significant past medical history for complicated cholecystectomy status post drain placement, gout, hypertension, peripheral vascular disease, type 2 diabetes mellitus and recurrent C. difficile diarrhea. About 2 days prior to hospitalization he completed antibiotic therapy and his biliary drain was removed. Since then he developed persistent, profuse diarrhea,associated with confusion. He tested positive for urinalysis as an outpatient with Citrobacter koseri.His initial physical examination his temperature was 101, blood pressure 136/85, heart rate 81, respiratory rate17, oxygen saturation 94%. His lungs had decreased breath sounds bilaterally, heart S1-S2 present rhythmic tachycardic, the abdomen was soft, nontender, nondistended, no lower extremity edema. Sodium 141, potassium 3.0, chloride 100, bicarb 28, glucose 206, BUN 9, creatinine 1.1, white count 24.9, hemoglobin 11.6, hematocrit 35.4, platelets 301.Urinalysis with specific gravity 1.013, 100 protein,0-5 white cells. Stool tested positive for C. Difficile.CT of the abdomen and pelvis with with possible mild circumferential wall thickening of the descending and sigmoid  colon. Small to moderate right pleural effusion and trace left pleural effusion. Head CT negative for acute changes. Chest x-ray with bilateral effusions more right and left. EKG with sinus rhythm, normal axis, normal intervals,positive PACs.  Patient was admitted to the hospital with working diagnosis of sepsis due to C. difficile colitis.To rule out underlying urinary tract infection.  1.  Sepsis due to recurrent C. difficile colitis/diarrhea, present on admission.  Complicated by hypokalemia and hypomagnesemia.  Patient was admitted to the medical ward, he was placed on a remote telemetry monitor, received IV fluids, antibiotic therapy with IV metronidazole and oral vancomycin.  He responded well to the medical therapy with improvement of his symptoms.  Posteriorly he was changed to monotherapy with oral vancomycin.  His discharge white cell count 12.0.  He has remained afebrile.  Patient will be discharge home with a vancomycin taper, follow-up with outpatient infectious disease clinic for a possible stool transplant.  His potassium and magnesium were corrected, discharged potassium 3.9, magnesium 1.9, serum bicarbonate of 25.   2.  Urinary tract infection, diagnosis present on admission.  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   3.  Type 2 diabetes mellitus.  Patient was placed on insulin sliding scale for glucose coverage and monitoring, capillary glucose remained well controlled.  4.  Diastolic heart failure.  No signs of acute exacerbation, patient tolerated well with intravenous fluids, to continue beta-blockade and ARB.  5.  Hypertension.  Blood pressure remained well controlled with losartan and metoprolol.  6.  Right heel ulcer, present on admission.  Ulcer stage I/stage II, continue local wound care.   Outpatient follow-up.  7.  Calorie protein malnutrition (undetermined severity).  Patient was placed  on nutritional supplements, he was seen by physical therapy, and found to be very deconditioned, recommendation for skilled nursing facility.  He and his family had decided to go home instead of skilled nursing facility.  Home health services will be arranged.  Discharge Diagnoses:  Active Problems:   PVD (peripheral vascular disease) (HCC)   Diabetes mellitus without complication (HCC)   Hypertension   Normocytic anemia   Clostridium difficile colitis   Hypokalemia   HLD (hyperlipidemia)   Pressure injury of skin    Discharge Instructions   Allergies as of 07/11/2018   No Known Allergies     Medication List    STOP taking these medications   HYDROcodone-acetaminophen 5-325 MG tablet Commonly known as:  NORCO/VICODIN     TAKE these medications   aspirin 81 MG tablet Take 81 mg by mouth daily.   brimonidine 0.2 % ophthalmic solution Commonly known as:  ALPHAGAN Place 1 drop into both eyes 2 (two) times daily.   feeding supplement (ENSURE ENLIVE) Liqd Take 237 mLs by mouth daily.   glimepiride 4 MG tablet Commonly known as:  AMARYL Take 1 tablet (4 mg total) by mouth daily with breakfast. What changed:  when to take this   losartan 100 MG tablet Commonly known as:  COZAAR Take 100 mg by mouth daily.   metoprolol succinate 50 MG 24 hr tablet Commonly known as:  TOPROL-XL Take 1 tablet (50 mg total) by mouth daily. Take with or immediately following a meal.   multivitamin with minerals Tabs tablet Take 1 tablet by mouth daily. Start taking on:  07/12/2018   simvastatin 40 MG tablet Commonly known as:  ZOCOR Take 40 mg by mouth daily.   timolol 0.5 % ophthalmic solution Commonly known as:  TIMOPTIC Place 1 drop into both eyes 2 (two) times daily.   vancomycin 50 mg/mL  oral solution Commonly known as:  VANCOCIN Take 2.5 mLs (125 mg total) by mouth every 6  (six) hours. Oral vancomycin taper 125mg  every six hours x 14 days, followed by 125mg  every 8 hours x 7 days, then 125mg  twice daily x 7 days, then 125mg  daily x 7 days. What changed:  See the new instructions.      Follow-up Information    [facility] Follow [facility].   Specialty:  Home Health Services [facility]social worker Contact information: [address] [place] [address] [phone]        Rinaldo CloudHarwani, Mohan, MD. Schedule an appointment as soon as possible for a visit.   Specialty:  Cardiology Why:  within 3-5 days of d/c. Contact information: [address] [place] [address] [phone]          No Known Allergies  Consultations:  ID   Procedures/Studies: Ct Head Wo Contrast  Result Date: 07/07/2018 CLINICAL DATA:  Altered level of consciousness EXAM: CT HEAD WITHOUT CONTRAST TECHNIQUE: Contiguous axial images were obtained from the base of the skull through the vertex without intravenous contrast. COMPARISON:  07/17/2016 FINDINGS: Brain: There is atrophy and chronic small vessel disease changes. No acute intracranial abnormality. Specifically, no hemorrhage, hydrocephalus, mass lesion, acute infarction, or significant intracranial injury. Vascular: No hyperdense vessel or unexpected calcification. Skull: No acute calvarial abnormality. Sinuses/Orbits: Visualized paranasal sinuses and mastoids clear. Orbital soft tissues unremarkable. Other: None IMPRESSION: No acute intracranial abnormality. Atrophy, chronic microvascular disease. Electronically Signed   By: Charlett NoseKevin  Dover M.D.   On: 07/07/2018 11:00   Ct  Abdomen Pelvis W Contrast  Result Date: 07/07/2018 CLINICAL DATA:  72 year old male with continued abdominal pain and diarrhea. History of acute cholecystitis with attempted cholecystectomy. Percutaneous cholecystostomy tube placed and recently removed. EXAM: CT ABDOMEN AND PELVIS  WITH CONTRAST TECHNIQUE: Multidetector CT imaging of the abdomen and pelvis was performed using the standard protocol following bolus administration of intravenous contrast. CONTRAST:  100mL ISOVUE-300 IOPAMIDOL (ISOVUE-300) INJECTION 61% COMPARISON:  06/04/2018 and prior CTs FINDINGS: Lower chest: A small to moderate RIGHT pleural effusion and trace LEFT pleural effusion are again noted. Coronary artery calcifications and UPPER limits normal heart size noted. Hepatobiliary: The liver is unremarkable. Ill-defined soft tissue/stranding in the region of the gallbladder again noted. No biliary dilatation. Pancreas: Unremarkable Spleen: Unremarkable Adrenals/Urinary Tract: Kidneys, adrenal glands and bladder are unremarkable. Stomach/Bowel: Possible mild circumferential wall thickening of the descending and sigmoid colon noted and may represent colitis. There is no evidence of bowel obstruction. There is mild wall enhancement of the appendix which is borderline and size but does not appear significantly changed in size when compared to 01/30/2018 CT. No evidence of abscess. Vascular/Lymphatic: Aortic atherosclerosis. No enlarged abdominal or pelvic lymph nodes. Reproductive: Prostate is unremarkable. Other: A tiny amount of perihepatic fluid is unchanged. No other ascites identified. The percutaneous abdominal catheter has been removed. Catheter tract is identified. Musculoskeletal: No acute or suspicious bony lesions. IMPRESSION: 1. Possible mild circumferential wall thickening of the descending and sigmoid colon-question colitis. No evidence of bowel obstruction. 2. Interval removal of abdominal drainage catheter. No evidence of abscess. 3. Small to moderate RIGHT pleural effusion and trace LEFT pleural effusion again noted. 4. No other significant change. 5. Coronary artery and aortic Atherosclerosis (ICD10-I70.0). Electronically Signed   By: Harmon PierJeffrey  Hu M.D.   On: 07/07/2018 11:14   Dg Chest Port 1 View  Result  Date: 07/07/2018 CLINICAL DATA:  Altered mental status EXAM: PORTABLE CHEST 1 VIEW COMPARISON:  May 18, 2018 FINDINGS: There are small pleural effusions bilaterally with bibasilar atelectasis. There is mild left midlung atelectasis. There is no consolidation. Heart is upper normal in size with pulmonary vascularity normal. No adenopathy. There is aortic atherosclerosis. There is calcification in the proximal humeral diaphysis on the right. IMPRESSION: Small pleural effusions bilaterally, slightly larger on the right than on the left. Bibasilar and left mid lung atelectasis. No consolidation. Stable cardiac silhouette. There is aortic atherosclerosis. There is either a small enchondroma or bone infarct in the proximal right humeral diaphysis, stable. Aortic Atherosclerosis (ICD10-I70.0). Electronically Signed   By: Bretta BangWilliam  Woodruff III M.D.   On: 07/07/2018 08:00       Subjective: Patient is feeling better, diarrhea continue to improve, no nausea or vomiting, no abdominal pain or dyspnea.   Discharge Exam: Vitals:   07/10/18 2031 07/11/18 0447  BP: (!) 156/59 (!) 155/94  Pulse: (!) 59 61  Resp: 18 18  Temp: 99.3 F (37.4 C) 99.2 F (37.3 C)  SpO2: 99% 99%   Vitals:   07/10/18 1453 07/10/18 2031 07/11/18 0447 07/11/18 0500  BP: (!) 153/67 (!) 156/59 (!) 155/94   Pulse: 63 (!) 59 61   Resp: 18 18 18    Temp: 97.9 F (36.6 C) 99.3 F (37.4 C) 99.2 F (37.3 C)   TempSrc: Oral Oral Oral   SpO2: 100% 99% 99%   Weight:    72.5 kg  Height:        General: Not in pain or dyspnea Neurology: Awake and alert, non focal  E ENT:  mild pallor, no icterus, oral mucosa moist Cardiovascular: No JVD. S1-S2 present, rhythmic, no gallops, rubs, or murmurs. No lower extremity edema. Pulmonary: vesicular breath sounds bilaterally, adequate air movement, no wheezing, rhonchi or rales. Gastrointestinal. Abdomen with no organomegaly, non tender, no rebound or guarding Skin. No rashes Musculoskeletal:  no joint deformities   The results of significant diagnostics from this hospitalization (including imaging, microbiology, ancillary and laboratory) are listed below for reference.     Microbiology: Recent Results (from the past 240 hour(s))  Blood Culture (routine x 2)     Status: None (Preliminary result)   Collection Time: 07/07/18  7:46 AM  Result Value Ref Range Status   Specimen Description   Final    BLOOD LEFT FOREARM Performed at [facility], [address] [place], [place] [address]    Special Requests   Final    BOTTLES DRAWN AEROBIC AND ANAEROBIC Blood Culture adequate volume Performed at [facility], [address] [place], [place] [address]    Culture   Final    NO GROWTH 3 DAYS Performed at [facility], [address], [place], [place] [address]    Report Status PENDING  Incomplete  Urine culture     Status: None   Collection Time: 07/07/18  7:46 AM  Result Value Ref Range Status   Specimen Description   Final    URINE, CATHETERIZED Performed at [facility], [address] [place], [place] [address]    Special Requests   Final    NONE Performed at [facility], [address] [place], [place] [address]    Culture   Final    NO GROWTH Performed at [facility], [address] [place], [place] [address]    Report Status 07/08/2018 FINAL  Final  Blood Culture (routine x 2)     Status: Abnormal   Collection Time: 07/07/18  8:50 AM  Result Value Ref Range Status   Specimen Description   Final    BLOOD LEFT ANTECUBITAL Performed at [facility], [address] [place], [place] [address]    Special Requests   Final    BOTTLES DRAWN AEROBIC ONLY Blood Culture adequate volume   Culture  Setup Time   Final    GRAM POSITIVE COCCI AEROBIC BOTTLE ONLY Organism ID to follow CRITICAL RESULT CALLED TO, READ BACK BY AND VERIFIED WITH: PHARMD MARY  SWEYNE 07/08/18 AT 1031 AM BY CM    Culture (A)  Final    STAPHYLOCOCCUS SPECIES (COAGULASE NEGATIVE) THE SIGNIFICANCE OF ISOLATING THIS ORGANISM FROM A SINGLE SET OF BLOOD CULTURES WHEN MULTIPLE SETS ARE DRAWN IS UNCERTAIN. PLEASE NOTIFY THE MICROBIOLOGY DEPARTMENT WITHIN ONE WEEK IF SPECIATION AND SENSITIVITIES ARE REQUIRED. Performed at [facility], [address], [place], [place] [address]    Report Status 07/10/2018 FINAL  Final  Blood Culture ID Panel (Reflexed)     Status: Abnormal   Collection Time: 07/07/18  8:50 AM  Result Value Ref Range Status   Enterococcus species NOT DETECTED NOT DETECTED Final   Listeria monocytogenes NOT DETECTED NOT DETECTED Final   Staphylococcus species DETECTED (A) NOT DETECTED Final    Comment: Methicillin (oxacillin) susceptible coagulase negative staphylococcus. Possible blood culture contaminant (unless isolated from more than one blood culture draw or clinical case suggests pathogenicity). No antibiotic treatment is indicated for blood  culture contaminants. CRITICAL RESULT CALLED TO, READ BACK BY AND VERIFIED WITH:  PHARMD MARY SWEYNE 07/08/18 AT 1031 AM BY CM    Staphylococcus aureus NOT DETECTED NOT DETECTED Final   Methicillin resistance NOT DETECTED NOT DETECTED Final   Streptococcus species NOT DETECTED NOT DETECTED Final   Streptococcus agalactiae NOT DETECTED NOT DETECTED Final   Streptococcus pneumoniae NOT DETECTED NOT DETECTED Final   Streptococcus pyogenes NOT DETECTED NOT DETECTED Final   Acinetobacter baumannii NOT DETECTED NOT DETECTED Final   Enterobacteriaceae species NOT DETECTED NOT DETECTED Final   Enterobacter cloacae complex NOT DETECTED NOT DETECTED Final   Escherichia coli NOT DETECTED NOT DETECTED Final   Klebsiella oxytoca NOT DETECTED NOT DETECTED Final   Klebsiella pneumoniae NOT DETECTED NOT DETECTED Final   Proteus species NOT DETECTED NOT DETECTED Final   Serratia marcescens NOT DETECTED NOT DETECTED Final    Haemophilus influenzae NOT DETECTED NOT DETECTED Final   Neisseria meningitidis NOT DETECTED NOT DETECTED Final   Pseudomonas aeruginosa NOT DETECTED NOT DETECTED Final   Candida albicans NOT DETECTED NOT DETECTED Final   Candida glabrata NOT DETECTED NOT DETECTED Final   Candida krusei NOT DETECTED NOT DETECTED Final   Candida parapsilosis NOT DETECTED NOT DETECTED Final   Candida tropicalis NOT DETECTED NOT DETECTED Final    Comment: Performed at [facility], [address], [place], [place] [address]  C difficile quick scan w PCR reflex     Status: Abnormal   Collection Time: 07/07/18  4:11 PM  Result Value Ref Range Status   C Diff antigen POSITIVE (A) NEGATIVE Final   C Diff toxin POSITIVE (A) NEGATIVE Final   C Diff interpretation Toxin producing C. difficile detected.  Final    Comment: CRITICAL RESULT CALLED TO, READ BACK BY AND VERIFIED WITH: Allena Napoleon [id number] @ 1822 BY J SCOTTON Performed at [facility], [address] [place], [place] [address]      Labs: BNP (last 3 results) Recent Labs    03/31/18 0519 03/31/18 1903 07/07/18 0850  BNP 526.8* 617.4* 499.2*   Basic Metabolic Panel: Recent Labs  Lab 07/07/18 0850 07/08/18 0518 07/09/18 0523 07/10/18 0511 07/11/18 0538  NA 141 139 139 140 138  K 3.0* 3.2* 3.6 3.6 3.9  CL 100 103 107 109 105  CO2 28 27 26 25 25   GLUCOSE 206* 143* 95 88 130*  BUN 9 13 10  7* 6*  CREATININE 1.10 1.18 1.00 0.84 0.84  CALCIUM 9.1 8.2* 8.1* 8.2* 8.3*  MG  --  1.5* 1.9  --   --   PHOS  --  2.5  --   --   --    Liver Function Tests: Recent Labs  Lab 07/07/18 0850 07/08/18 0518  AST 14* 11*  ALT 13 11  ALKPHOS 101 83  BILITOT 1.0 0.5  PROT 8.0 6.2*  ALBUMIN 2.9* 2.1*   No results for input(s): LIPASE, AMYLASE in the last 168 hours. No results for input(s): AMMONIA in the last 168 hours. CBC: Recent Labs  Lab 07/07/18 0850 07/08/18 0518 07/09/18 0523 07/10/18 0511 07/11/18 0538  WBC  24.9* 15.7* 9.3 10.0 12.0*  NEUTROABS 21.5* 12.1* 4.9 5.3 7.2  HGB 11.6* 9.5* 8.8* 9.3* 9.6*  HCT 35.4* 29.2* 26.3* 28.3* 29.4*  MCV 81.8 81.8 81.7 79.9 80.3  PLT 301 255 245 250 283   Cardiac Enzymes: No results for input(s): CKTOTAL, CKMB, CKMBINDEX, TROPONINI in the last 168 hours. BNP: Invalid input(s): POCBNP CBG: Recent Labs  Lab 07/10/18 1203 07/10/18 1615 07/10/18 2121 07/10/18  2316 07/11/18 0810  GLUCAP 192* 174* 167* 159* 125*   D-Dimer No results for input(s): DDIMER in the last 72 hours. Hgb A1c No results for input(s): HGBA1C in the last 72 hours. Lipid Profile No results for input(s): CHOL, HDL, LDLCALC, TRIG, CHOLHDL, LDLDIRECT in the last 72 hours. Thyroid function studies No results for input(s): TSH, T4TOTAL, T3FREE, THYROIDAB in the last 72 hours.  Invalid input(s): FREET3 Anemia work up No results for input(s): VITAMINB12, FOLATE, FERRITIN, TIBC, IRON, RETICCTPCT in the last 72 hours. Urinalysis    Component Value Date/Time   COLORURINE YELLOW 07/07/2018 0746   APPEARANCEUR CLEAR 07/07/2018 0746   LABSPEC 1.013 07/07/2018 0746   PHURINE 6.0 07/07/2018 0746   GLUCOSEU NEGATIVE 07/07/2018 0746   HGBUR SMALL (A) 07/07/2018 0746   BILIRUBINUR NEGATIVE 07/07/2018 0746   KETONESUR NEGATIVE 07/07/2018 0746   PROTEINUR 100 (A) 07/07/2018 0746   UROBILINOGEN 0.2 01/09/2009 1532   NITRITE NEGATIVE 07/07/2018 0746   LEUKOCYTESUR NEGATIVE 07/07/2018 0746   Sepsis Labs Invalid input(s): PROCALCITONIN,  WBC,  LACTICIDVEN Microbiology Recent Results (from the past 240 hour(s))  Blood Culture (routine x 2)     Status: None (Preliminary result)   Collection Time: 07/07/18  7:46 AM  Result Value Ref Range Status   Specimen Description   Final    BLOOD LEFT FOREARM Performed at [facility], [address], [place], [place] [address]    Special Requests   Final    BOTTLES DRAWN AEROBIC AND ANAEROBIC Blood Culture adequate  volume Performed at [facility], [address], [place], [place] [address]    Culture   Final    NO GROWTH 3 DAYS Performed at [facility], [address], [place], [place] [address]    Report Status PENDING  Incomplete  Urine culture     Status: None   Collection Time: 07/07/18  7:46 AM  Result Value Ref Range Status   Specimen Description   Final    URINE, CATHETERIZED Performed at [facility], [address], [place], [place] [address]    Special Requests   Final    NONE Performed at [facility], [address], [place], [place] [address]    Culture   Final    NO GROWTH Performed at [facility], [address] [place], [place] [address]    Report Status 07/08/2018 FINAL  Final  Blood Culture (routine x 2)     Status: Abnormal   Collection Time: 07/07/18  8:50 AM  Result Value Ref Range Status   Specimen Description   Final    BLOOD LEFT ANTECUBITAL Performed at [facility], [address], [place], [place] [address]    Special Requests   Final    BOTTLES DRAWN AEROBIC ONLY Blood Culture adequate volume   Culture  Setup Time   Final    GRAM POSITIVE COCCI AEROBIC BOTTLE ONLY Organism ID to follow CRITICAL RESULT CALLED TO, READ BACK BY AND VERIFIED WITH: PHARMD MARY SWEYNE 07/08/18 AT 1031 AM BY CM    Culture (A)  Final    STAPHYLOCOCCUS SPECIES (COAGULASE NEGATIVE) THE SIGNIFICANCE OF ISOLATING THIS ORGANISM FROM A SINGLE SET OF BLOOD CULTURES WHEN MULTIPLE SETS ARE DRAWN IS UNCERTAIN. PLEASE NOTIFY THE MICROBIOLOGY DEPARTMENT WITHIN ONE WEEK IF SPECIATION AND SENSITIVITIES ARE REQUIRED. Performed at [facility], [address] [place], [place] [address]    Report Status 07/10/2018 FINAL  Final  Blood Culture ID Panel (Reflexed)     Status: Abnormal   Collection Time: 07/07/18  8:50 AM  Result Value Ref Range Status   Enterococcus species NOT DETECTED NOT DETECTED  Final   Listeria monocytogenes NOT DETECTED NOT DETECTED Final   Staphylococcus species DETECTED (A) NOT DETECTED Final    Comment: Methicillin (oxacillin) susceptible coagulase negative staphylococcus. Possible blood culture contaminant (unless isolated from more than one blood culture draw or clinical case suggests pathogenicity). No antibiotic treatment is indicated for blood  culture contaminants. CRITICAL RESULT CALLED TO, READ BACK BY AND VERIFIED WITH: PHARMD MARY SWEYNE 07/08/18 AT 1031 AM BY CM    Staphylococcus aureus NOT DETECTED NOT DETECTED Final   Methicillin resistance NOT DETECTED NOT DETECTED Final   Streptococcus species NOT DETECTED NOT DETECTED Final   Streptococcus agalactiae NOT DETECTED NOT DETECTED Final   Streptococcus pneumoniae NOT DETECTED NOT DETECTED Final   Streptococcus pyogenes NOT DETECTED NOT DETECTED Final   Acinetobacter baumannii NOT DETECTED NOT DETECTED Final   Enterobacteriaceae species NOT DETECTED NOT DETECTED Final   Enterobacter cloacae complex NOT DETECTED NOT DETECTED Final   Escherichia coli NOT DETECTED NOT DETECTED Final   Klebsiella oxytoca NOT DETECTED NOT DETECTED Final   Klebsiella pneumoniae NOT DETECTED NOT DETECTED Final   Proteus species NOT DETECTED NOT DETECTED Final   Serratia marcescens NOT DETECTED NOT DETECTED Final   Haemophilus influenzae NOT DETECTED NOT DETECTED Final   Neisseria meningitidis NOT DETECTED NOT DETECTED Final   Pseudomonas aeruginosa NOT DETECTED NOT DETECTED Final   Candida albicans NOT DETECTED NOT DETECTED Final   Candida glabrata NOT DETECTED NOT DETECTED Final   Candida krusei NOT DETECTED NOT DETECTED Final   Candida parapsilosis NOT DETECTED NOT DETECTED Final   Candida tropicalis NOT DETECTED NOT DETECTED Final    Comment: Performed at [facility], [address] [place], [place] [address]  C difficile quick scan w PCR reflex     Status: Abnormal   Collection Time: 07/07/18  4:11 PM   Result Value Ref Range Status   C Diff antigen POSITIVE (A) NEGATIVE Final   C Diff toxin POSITIVE (A) NEGATIVE Final   C Diff interpretation Toxin producing C. difficile detected.  Final    Comment: CRITICAL RESULT CALLED TO, READ BACK BY AND VERIFIED WITH: Allena Napoleon [id number] @ 1822 BY J SCOTTON Performed at [facility], [address] [place], [place] [address]      Time coordinating discharge: 45 minutes  SIGNED:   Coralie Keens, MD  [facility] Hospitalists 07/11/2018, 9:50 AM Pager [phone]  If 7PM-7AM, please contact night-coverage [email or web address] Password [email or web address]

## 2018-07-11 NOTE — Care Management Note (Addendum)
Case Management Note  Patient Details  Name: Jeremiah [name] MRN: [id number] Date of Birth: 1946/10/19  Subjective/Objective:     Sepsis C Diff               Action/Plan: Please see previous NCM notes. Contacted [facility] to make aware of dc home today with HH. Wife states she will pick up oral Vancomycin from [facility]. She prefers oral solution and not capsules. She pays $40 a bottle. He has RW at home.   Expected Discharge Date:  07/11/18               Expected Discharge Plan:  Home w Home Health Services  In-House Referral:  Clinical Social Work  Discharge planning Services  CM Consult  Post Acute Care Choice:  Home Health Choice offered to:  Patient  DME Arranged:  N/A DME Agency:  NA  HH Arranged:  RN, PT, Nurse's Aide HH Agency:  [facility]  Status of Service:  Completed, signed off  If discussed at Long Length of Stay Meetings, dates discussed:    Additional Comments:  Elliot CousinShavis, Treva Huyett Ellen, RN 07/11/2018, 11:08 AM

## 2018-07-12 LAB — CULTURE, BLOOD (ROUTINE X 2)
Culture: NO GROWTH
Special Requests: ADEQUATE

## 2018-07-13 DIAGNOSIS — R4182 Altered mental status, unspecified: Secondary | ICD-10-CM

## 2018-07-13 DIAGNOSIS — D72829 Elevated white blood cell count, unspecified: Secondary | ICD-10-CM

## 2018-07-13 DIAGNOSIS — N3000 Acute cystitis without hematuria: Secondary | ICD-10-CM

## 2018-07-14 MED FILL — VANCOMYCIN 50MG/ML-ORAL SOL: 14 days supply | Qty: 140 | Fill #0

## 2018-07-20 ENCOUNTER — Emergency Department (HOSPITAL_COMMUNITY): Payer: Medicare Other

## 2018-07-20 ENCOUNTER — Other Ambulatory Visit: Payer: Self-pay

## 2018-07-20 ENCOUNTER — Emergency Department (HOSPITAL_COMMUNITY)
Admission: EM | Admit: 2018-07-20 | Discharge: 2018-07-20 | Disposition: A | Discharge status: Home / Self Care | Payer: Medicare Other | Attending: Emergency Medicine | Admitting: Emergency Medicine

## 2018-07-20 ENCOUNTER — Encounter (HOSPITAL_COMMUNITY): Payer: Self-pay | Admitting: Emergency Medicine

## 2018-07-20 DIAGNOSIS — X58XXXA Exposure to other specified factors, initial encounter: Secondary | ICD-10-CM | POA: Diagnosis not present

## 2018-07-20 DIAGNOSIS — Z87891 Personal history of nicotine dependence: Secondary | ICD-10-CM | POA: Insufficient documentation

## 2018-07-20 DIAGNOSIS — S39012A Strain of muscle, fascia and tendon of lower back, initial encounter: Secondary | ICD-10-CM

## 2018-07-20 DIAGNOSIS — Y939 Activity, unspecified: Secondary | ICD-10-CM | POA: Diagnosis not present

## 2018-07-20 DIAGNOSIS — Z79899 Other long term (current) drug therapy: Secondary | ICD-10-CM | POA: Diagnosis not present

## 2018-07-20 DIAGNOSIS — R531 Weakness: Secondary | ICD-10-CM | POA: Diagnosis present

## 2018-07-20 DIAGNOSIS — Z7982 Long term (current) use of aspirin: Secondary | ICD-10-CM | POA: Diagnosis not present

## 2018-07-20 DIAGNOSIS — E1151 Type 2 diabetes mellitus with diabetic peripheral angiopathy without gangrene: Secondary | ICD-10-CM | POA: Diagnosis not present

## 2018-07-20 DIAGNOSIS — Y999 Unspecified external cause status: Secondary | ICD-10-CM | POA: Diagnosis not present

## 2018-07-20 DIAGNOSIS — Z7984 Long term (current) use of oral hypoglycemic drugs: Secondary | ICD-10-CM | POA: Insufficient documentation

## 2018-07-20 DIAGNOSIS — I110 Hypertensive heart disease with heart failure: Secondary | ICD-10-CM | POA: Insufficient documentation

## 2018-07-20 DIAGNOSIS — M479 Spondylosis, unspecified: Secondary | ICD-10-CM

## 2018-07-20 DIAGNOSIS — E119 Type 2 diabetes mellitus without complications: Secondary | ICD-10-CM | POA: Insufficient documentation

## 2018-07-20 DIAGNOSIS — Y929 Unspecified place or not applicable: Secondary | ICD-10-CM | POA: Diagnosis not present

## 2018-07-20 DIAGNOSIS — I5040 Unspecified combined systolic (congestive) and diastolic (congestive) heart failure: Secondary | ICD-10-CM | POA: Diagnosis not present

## 2018-07-20 LAB — COMPREHENSIVE METABOLIC PANEL
ALT: 13 U/L (ref 0–44)
AST: 17 U/L (ref 15–41)
Albumin: 2.6 g/dL — ABNORMAL LOW (ref 3.5–5.0)
Alkaline Phosphatase: 93 U/L (ref 38–126)
Anion gap: 9 (ref 5–15)
BUN: 8 mg/dL (ref 8–23)
CO2: 30 mmol/L (ref 22–32)
Calcium: 9.1 mg/dL (ref 8.9–10.3)
Chloride: 102 mmol/L (ref 98–111)
Creatinine, Ser: 1.06 mg/dL (ref 0.61–1.24)
GFR calc Af Amer: >60 mL/min (ref >60)
GFR calc non Af Amer: >60 mL/min (ref >60)
Glucose, Bld: 80 mg/dL (ref 70–99)
Potassium: 3.9 mmol/L (ref 3.5–5.1)
Sodium: 141 mmol/L (ref 135–145)
Total Bilirubin: 0.5 mg/dL (ref 0.3–1.2)
Total Protein: 7.4 g/dL (ref 6.5–8.1)

## 2018-07-20 LAB — URINALYSIS, ROUTINE W REFLEX MICROSCOPIC
Bilirubin Urine: NEGATIVE
Glucose, UA: NEGATIVE mg/dL
Hgb urine dipstick: NEGATIVE
Ketones, ur: NEGATIVE mg/dL
Leukocytes, UA: NEGATIVE
Nitrite: NEGATIVE
Protein, ur: 30 mg/dL — AB
Specific Gravity, Urine: 1.009 (ref 1.005–1.030)
pH: 8 (ref 5.0–8.0)

## 2018-07-20 LAB — CBC WITH DIFFERENTIAL/PLATELET
Basophils Absolute: 0 10*3/uL (ref 0.0–0.1)
Basophils Relative: 0 %
Eosinophils Absolute: 1.1 10*3/uL — ABNORMAL HIGH (ref 0.0–0.7)
Eosinophils Relative: 7 %
HCT: 30.6 % — ABNORMAL LOW (ref 39.0–52.0)
Hemoglobin: 10 g/dL — ABNORMAL LOW (ref 13.0–17.0)
Lymphocytes Relative: 21 %
Lymphs Abs: 3.6 10*3/uL (ref 0.7–4.0)
MCH: 27 pg (ref 26.0–34.0)
MCHC: 32.7 g/dL (ref 30.0–36.0)
MCV: 82.5 fL (ref 78.0–100.0)
Monocytes Absolute: 1.3 10*3/uL — ABNORMAL HIGH (ref 0.1–1.0)
Monocytes Relative: 8 %
Neutro Abs: 10.9 10*3/uL — ABNORMAL HIGH (ref 1.7–7.7)
Neutrophils Relative %: 64 %
Platelets: 370 10*3/uL (ref 150–400)
RBC: 3.71 MIL/uL — ABNORMAL LOW (ref 4.22–5.81)
RDW: 16.5 % — ABNORMAL HIGH (ref 11.5–15.5)
WBC: 17 10*3/uL — ABNORMAL HIGH (ref 4.0–10.5)

## 2018-07-20 MED ORDER — CLONIDINE HCL 0.1 MG PO TABS
0.2000 mg | ORAL_TABLET | Freq: Once | ORAL | Status: AC
  Administered 2018-07-20: 0.2 mg via ORAL
  Filled 2018-07-20: qty 2

## 2018-07-20 MED ORDER — ACETAMINOPHEN 325 MG PO TABS
650.0000 mg | ORAL_TABLET | Freq: Once | ORAL | Status: AC
  Administered 2018-07-20: 650 mg via ORAL
  Filled 2018-07-20: qty 2

## 2018-07-20 NOTE — ED Notes (Signed)
Patient transported to MRI 

## 2018-07-20 NOTE — ED Provider Notes (Signed)
[facility]   CSN: [id number] Arrival date & time: 07/20/18  1302     History   Chief Complaint Chief Complaint  Patient presents with  . Weakness  . Hypertension    HPI Jeremiah [name] is a 72 y.o. male.  HPI Patient with multiple recent admissions for cholecystitis and recurrent C. difficile colitis.  He is still on oral vancomycin.  States that for the past week he has been having increasing weakness to his left leg and his leg is been "giving out" on him.  He denies lightheadedness or any known head injury.  States he is having some pain in his back that radiates into his left buttock.  Complains of tingling distance sensation in both feet.  Denies upper extremity weakness.  No neck pain. Past Medical History:  Diagnosis Date  . Arthritis    "joints; shoulders, knees, hands, back" (05/21/2018)  . C. difficile diarrhea 04/2018  . Diastolic CHF (HCC)   . High cholesterol   . History of gout   . Hypertension   . Peripheral vascular disease (HCC)   . Pneumonia    "couple times" (05/21/2018)  . Sleep apnea    "has mask; won't use" (05/21/2018)  . Type II diabetes mellitus [facility](HCC)     Patient Active Problem List   Diagnosis Date Noted  . Altered mental status   . Acute cystitis without hematuria   . Leukocytosis   . Pressure injury of skin 07/08/2018  . Clostridium difficile colitis 07/07/2018  . Hypokalemia 07/07/2018  . HLD (hyperlipidemia) 07/07/2018  . Acute diverticulitis 06/04/2018  . Cholecystitis 05/24/2018  . Cholecystitis with cholelithiasis 05/21/2018  . Acute systolic heart failure (HCC) 03/27/2018  . Diabetes mellitus without complication (HCC) 01/30/2018  . Hypertension 01/30/2018  . Acute cholecystitis 01/30/2018  . AKI (acute kidney injury) (HCC) 01/30/2018  . Normocytic anemia 01/30/2018  . PVD (peripheral vascular disease) (HCC) 09/05/2014    Past Surgical History:  Procedure Laterality Date  .  CATARACT EXTRACTION W/ INTRAOCULAR LENS  IMPLANT, BILATERAL Bilateral   . CHOLECYSTECTOMY  05/21/2018   ATTEMPTED LAPAROSCOPIC CHOLECYSTECTOMY, OPEN DRAINAGE OF GALLBLADDER WITH BIOPSY  . CHOLECYSTECTOMY N/A 05/21/2018   Procedure: ATTEMPTED LAPAROSCOPIC CHOLECYSTECTOMY, OPEN DRAINAGE OF GALLBLADDER WITH BIOPSY;  Surgeon: Griselda Mineroth, Paul III, MD;  Location: [facility] OR;  Service: General;  Laterality: N/A;  . COLONOSCOPY W/ POLYPECTOMY    . IR CATHETER TUBE CHANGE  04/14/2018  . IR CHOLANGIOGRAM EXISTING TUBE  03/17/2018  . IR PERC CHOLECYSTOSTOMY  01/31/2018  . IR RADIOLOGIST EVAL & MGMT  03/02/2018        Home Medications    Prior to Admission medications   Medication Sig Start Date End Date Taking? Authorizing Provider  aspirin 81 MG tablet Take 81 mg by mouth daily.   Yes [provider]  brimonidine (ALPHAGAN) 0.2 % ophthalmic solution Place 1 drop into both eyes 2 (two) times daily.    Yes [provider]  glimepiride (AMARYL) 4 MG tablet Take 1 tablet (4 mg total) by mouth daily with breakfast. Patient taking differently: Take 4 mg by mouth daily.  02/02/18  Yes Buriev, Isaiah SergeUlugbek N, MD  HYDROcodone-acetaminophen (NORCO/VICODIN) 5-325 MG tablet Take 1-2 tablets by mouth every 6 (six) hours as needed for pain. 07/13/18  Yes [provider]  losartan (COZAAR) 100 MG tablet Take 100 mg by mouth daily. 03/01/18  Yes [provider]  metoprolol succinate (TOPROL-XL) 50 MG 24 hr tablet Take  1 tablet (50 mg total) by mouth daily. Take with or immediately following a meal. 04/02/18  Yes Rinaldo Cloud, MD  Multiple Vitamin (MULTIVITAMIN WITH MINERALS) TABS tablet Take 1 tablet by mouth daily. 07/12/18 08/11/18 Yes Arrien, York Ram, MD  simvastatin (ZOCOR) 40 MG tablet Take 40 mg by mouth daily.   Yes [provider]  timolol (TIMOPTIC) 0.5 % ophthalmic solution Place 1 drop into both eyes 2 (two) times daily. 01/27/18  Yes [provider]  vancomycin  (VANCOCIN) 50 mg/mL oral solution Take 2.5 mLs (125 mg total) by mouth every 6 (six) hours. Oral vancomycin taper 125mg  every six hours x 14 days, followed by 125mg  every 8 hours x 7 days, then 125mg  twice daily x 7 days, then 125mg  daily x 7 days. 07/11/18  Yes Arrien, York Ram, MD  feeding supplement, ENSURE ENLIVE, (ENSURE ENLIVE) LIQD Take 237 mLs by mouth daily. Patient not taking: Reported on 07/20/2018 07/11/18 08/10/18  Arrien, York Ram, MD    Family History Family History  Problem Relation Age of Onset  . Diabetes Mother   . Hypertension Mother   . Heart disease Father   . Heart attack Father   . Diabetes Sister   . Hypertension Sister   . Diabetes Brother   . Heart disease Brother   . Hypertension Brother   . Heart attack Brother     Social History Social History   Tobacco Use  . Smoking status: Former Smoker    Years: 24.00    Types: Cigarettes    Last attempt to quit: 11/25/1983    Years since quitting: 34.6  . Smokeless tobacco: Never Used  Substance Use Topics  . Alcohol use: Not Currently  . Drug use: No     Allergies   Patient has no known allergies.   Review of Systems Review of Systems  Constitutional: Negative for chills and fever.  HENT: Negative for trouble swallowing.   Eyes: Negative for visual disturbance.  Respiratory: Negative for cough and shortness of breath.   Cardiovascular: Negative for chest pain.  Gastrointestinal: Negative for abdominal pain, constipation, diarrhea, nausea and vomiting.  Genitourinary: Negative for difficulty urinating, dysuria, flank pain, frequency and hematuria.  Musculoskeletal: Positive for back pain and gait problem. Negative for arthralgias, myalgias and neck pain.  Skin: Negative for rash and wound.  Neurological: Positive for weakness and numbness. Negative for dizziness, speech difficulty, light-headedness and headaches.  All other systems reviewed and are negative.    Physical Exam Updated  Vital Signs BP (!) 150/67   Pulse 60   Temp 98.5 F (36.9 C) (Oral)   Resp 16   SpO2 97%   Physical Exam  Constitutional: He is oriented to person, place, and time. He appears well-developed and well-nourished. No distress.  HENT:  Head: Normocephalic and atraumatic.  Mouth/Throat: Oropharynx is clear and moist.  No obvious head injury.  Cranial nerves II through XII grossly intact.  Eyes: Pupils are equal, round, and reactive to light. EOM are normal.  Neck: Normal range of motion. Neck supple.  No posterior midline cervical tenderness to palpation.  Cardiovascular: Normal rate and regular rhythm. Exam reveals no gallop and no friction rub.  No murmur heard. Pulmonary/Chest: Effort normal and breath sounds normal. No stridor. No respiratory distress. He has no wheezes. He has no rales. He exhibits no tenderness.  Abdominal: Soft. Bowel sounds are normal. There is no tenderness. There is no rebound and no guarding.  Musculoskeletal: Normal range of motion.  He exhibits tenderness. He exhibits no edema.  Mild left paraspinal lumbar tenderness.  No definite midline thoracic or lumbar tenderness.  Pelvis is stable.  Full range of motion of bilateral hips without discomfort.  Neurological: He is alert and oriented to person, place, and time.  3/5 left hip flexion.  5/5 right hip flexion.  5/5 bilateral dorsiflexion and plantarflexion.  5/5 bilateral grip strength.  Sensation intact.  No saddle anesthesia.  Skin: Skin is warm and dry. Capillary refill takes less than 2 seconds. No rash noted. He is not diaphoretic. No erythema.  Psychiatric: He has a normal mood and affect. His behavior is normal.  Nursing note and vitals reviewed.    ED Treatments / Results  Labs (all labs ordered are listed, but only abnormal results are displayed) Labs Reviewed  CBC WITH DIFFERENTIAL/PLATELET - Abnormal; Notable for the following components:      Result Value   WBC 17.0 (*)    RBC 3.71 (*)     Hemoglobin 10.0 (*)    HCT 30.6 (*)    RDW 16.5 (*)    Neutro Abs 10.9 (*)    Monocytes Absolute 1.3 (*)    Eosinophils Absolute 1.1 (*)    All other components within normal limits  COMPREHENSIVE METABOLIC PANEL - Abnormal; Notable for the following components:   Albumin 2.6 (*)    All other components within normal limits  URINALYSIS, ROUTINE W REFLEX MICROSCOPIC - Abnormal; Notable for the following components:   Protein, ur 30 (*)    Bacteria, UA RARE (*)    All other components within normal limits    EKG EKG Interpretation  Date/Time:  Tuesday July 20 2018 13:40:22 EDT Ventricular Rate:  71 PR Interval:    QRS Duration: 107 QT Interval:  405 QTC Calculation: 441 R Axis:   73 Text Interpretation:  Sinus rhythm No significant change since last tracing Confirmed by Linwood Dibbles [phone]) on 07/21/2018 12:12:16 PM   Radiology Ct Head Wo Contrast  Result Date: 07/20/2018 CLINICAL DATA:  Weakness for 2 days and recent fall EXAM: CT HEAD WITHOUT CONTRAST TECHNIQUE: Contiguous axial images were obtained from the base of the skull through the vertex without intravenous contrast. COMPARISON:  07/07/18 FINDINGS: Brain: No evidence of acute infarction, hemorrhage, hydrocephalus, extra-axial collection or mass lesion/mass effect. Mild atrophic changes and chronic white matter ischemic changes are again noted. Vascular: No hyperdense vessel or unexpected calcification. Skull: Normal. Negative for fracture or focal lesion. Sinuses/Orbits: No acute finding. Other: None. IMPRESSION: Chronic changes as described without acute abnormality. Electronically Signed   By: Alcide Clever M.D.   On: 07/20/2018 14:38   Mr Lumbar Spine Wo Contrast  Result Date: 07/20/2018 CLINICAL DATA:  Weakness since yesterday after fall. Patient reports inability to move. EXAM: MRI LUMBAR SPINE WITHOUT CONTRAST TECHNIQUE: Multiplanar, multisequence MR imaging of the lumbar spine was performed. No intravenous contrast was  administered. COMPARISON:  CT abdomen and pelvis July 07, 2018 FINDINGS: SEGMENTATION: For the purposes of this report, the last well-formed intervertebral disc is reported as L5-S1. ALIGNMENT: Maintained lumbar lordosis. No malalignment. VERTEBRAE:Vertebral bodies are intact. Intervertebral discs demonstrate normal morphology maintained with mild disc desiccation multilevel mild chronic discogenic endplate changes. 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 No acute or abnormal bone marrow signal. Bridging sacroiliac osteophyte. CONUS MEDULLARIS AND CAUDA EQUINA: Conus medullaris terminates at T12-L1 and demonstrates normal morphology and signal characteristics. Cauda equina is normal. PARASPINAL AND OTHER SOFT TISSUES: Mildly increased interstitial  STIR signal RIGHT paraspinal muscles concerning for low-grade strain. DISC LEVELS: L1-2: Small broad-based disc bulge. No canal stenosis or neural foraminal narrowing. L2-3: Small broad-based disc bulge, moderate LEFT extraforaminal disc protrusion contacting the exited LEFT L2 nerve. Mild canal stenosis. Mild LEFT neural foraminal narrowing. L3-4: Small broad-based disc bulge eccentric laterally. Mild canal stenosis. Moderate bilateral neural foraminal narrowing. Encroachment upon the exited LEFT greater than RIGHT L3 nerves. L4-5: Moderate broad-based disc bulge and annular fissure. Trace RIGHT facet arthropathy. Moderate canal stenosis. Moderate to severe bilateral neural foraminal narrowing. L5-S1: Small broad-based disc bulge, small LEFT subarticular disc protrusion. No canal stenosis. Mild LEFT neural foraminal narrowing. IMPRESSION: 1. No fracture, malalignment or acute osseous process. Probable RIGHT paraspinal low-grade muscle strain. 2. 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 3. Moderate canal stenosis L4-5.  Mild canal stenosis L2-3 and L3-4. 4. Neural foraminal narrowing L2-3 through  L5-S1: Moderate to severe at L4-5. Electronically Signed   By: Awilda Metro M.D.   On: 07/20/2018 16:37    Procedures Procedures (including critical care time)  Medications Ordered in ED Medications  acetaminophen (TYLENOL) tablet 650 mg (650 mg Oral Given 07/20/18 1541)  cloNIDine (CATAPRES) tablet 0.2 mg (0.2 mg Oral Given 07/20/18 1645)     Initial Impression / Assessment and Plan / ED Course  I have reviewed the triage vital signs and the nursing notes.  Pertinent labs & imaging results that were available during my care of the patient were reviewed by me and considered in my medical decision making (see chart for details).     Review of patient's recent CT abdomen and pelvis.  Patient does have significant degenerative disease of the lumbar spine especially in the L2/L3 region.  Given worsening weakness suspect this is the etiology.  Will get MRI lumbar spine.  Wife does report the patient hit his head when he fell.  Will screen with CT brain. CT brain without acute findings.  Signed out to oncoming emergency provider pending MRI lumbar spine. Final Clinical Impressions(s) / ED Diagnoses   Final diagnoses:  Strain of lumbar paraspinous muscle, initial encounter  Osteoarthritis of spine, unspecified spinal osteoarthritis complication status, unspecified spinal region    ED Discharge Orders    None       Loren Racer, MD 07/21/18 1323

## 2018-07-20 NOTE — ED Provider Notes (Signed)
I received this patient in signout from Dr. Ranae PalmsYelverton.  We were awaiting MRI results.  MRI showed no acute bony process.  Findings consistent with right paraspinal low-grade muscle strain which may explain his pain.  He does have degenerative changes of his spine but no cord compression.  I discussed these findings with the patient and his wife.  He already receives physical therapy twice weekly and I encouraged him to continue.  Extensively reviewed return precautions including any new numbness, worsening weakness, or bowel/bladder incontinence.  They voiced understanding.   Lurleen Soltero, Ambrose Finlandachel Morgan, MD 07/20/18 [phone]

## 2018-07-20 NOTE — ED Triage Notes (Signed)
Per EMS: Pt c/o of weakness since yesterday.  Pt had ground level fall yesterday, hit head during fall, wife witnessed fall. Pt's initial BP 210/110.  Pt finishing ABX from C. Diff.  Pt took BP meds this am.

## 2018-07-20 NOTE — ED Notes (Signed)
Pt aware of urine sample needed,urinal at bedside. 

## 2018-07-29 ENCOUNTER — Ambulatory Visit (INDEPENDENT_AMBULATORY_CARE_PROVIDER_SITE_OTHER): Payer: Medicare Other | Admitting: Family

## 2018-07-29 ENCOUNTER — Encounter: Payer: Self-pay | Admitting: Family

## 2018-07-29 VITALS — BP 185/75 | HR 64 | Temp 98.4°F | Ht 69.0 in | Wt 157.0 lb

## 2018-07-29 DIAGNOSIS — A0472 Enterocolitis due to Clostridium difficile, not specified as recurrent: Secondary | ICD-10-CM | POA: Diagnosis not present

## 2018-07-29 NOTE — Progress Notes (Signed)
Subjective:    Patient ID: Jeremiah [name], male    DOB: Aug 14, 1946, 72 y.o.   MRN: [id number]  Chief Complaint  Patient presents with  . Clostridium Difficile Colitis    HPI:  Jeremiah [name] is a 72 y.o. male who presents today for initial office visit following hospitalization for C. Difficile diarrhea.   [name] was admitted to the hospital on 07/07/18 with altered mental status and diarrhea following being discharged from the hospital about 1 month prior with acute colitis secondary to C. Difficile. During his July admission he was treated with vancomycin pulse taper. A gall bladder drain was removed the day prior to admission and he began having fevers, rigors, and poor oral intake. He had a leukocytosis of 24,000 with a left shift. Initially started on IV metronidazole and oral vancomycin. On 8/15 UTI was deteremined to be asymptomatic bacteruria. His Vancomycin was tapered with 125 mg QID x 14 days (including what he received), 125 mg TID x 7 days, then 125 mg BID x 7 days and then off with 125 mg x 7 days.   Mr. Deman continues to take his vancomcyin. He is currently completing the 4x per day. He is currently having 1 formed bowel movement per day. He has had no further episodes of diarrhea. He is eating and drinking with no abdominal pain, nausea or vomiting. Overall feeling better.   Wt Readings from Last 3 Encounters:  07/29/18 157 lb (71.2 kg)  07/11/18 159 lb 14.4 oz (72.5 kg)  06/06/18 172 lb 6.4 oz (78.2 kg)     No Known Allergies    Outpatient Medications Prior to Visit  Medication Sig Dispense Refill  . aspirin 81 MG tablet Take 81 mg by mouth daily.    . brimonidine (ALPHAGAN) 0.2 % ophthalmic solution Place 1 drop into both eyes 2 (two) times daily.     . feeding supplement, ENSURE ENLIVE, (ENSURE ENLIVE) LIQD Take 237 mLs by mouth daily. 7110 mL 0  . glimepiride (AMARYL) 4 MG tablet Take 1 tablet (4 mg total) by mouth daily with breakfast. (Patient taking  differently: Take 4 mg by mouth daily. ) 60 tablet 0  . HYDROcodone-acetaminophen (NORCO/VICODIN) 5-325 MG tablet Take 1-2 tablets by mouth every 6 (six) hours as needed for pain.  0  . losartan (COZAAR) 100 MG tablet Take 100 mg by mouth daily.  3  . metoprolol succinate (TOPROL-XL) 50 MG 24 hr tablet Take 1 tablet (50 mg total) by mouth daily. Take with or immediately following a meal. 30 tablet 3  . Multiple Vitamin (MULTIVITAMIN WITH MINERALS) TABS tablet Take 1 tablet by mouth daily. 30 tablet 0  . simvastatin (ZOCOR) 40 MG tablet Take 40 mg by mouth daily.    . timolol (TIMOPTIC) 0.5 % ophthalmic solution Place 1 drop into both eyes 2 (two) times daily.  6  . vancomycin (VANCOCIN) 50 mg/mL oral solution Take 2.5 mLs (125 mg total) by mouth every 6 (six) hours. Oral vancomycin taper '125mg'$  every six hours x 14 days, followed by '125mg'$  every 8 hours x 7 days, then '125mg'$  twice daily x 7 days, then '125mg'$  daily x 7 days. 250 mL 0   No facility-administered medications prior to visit.      Past Medical History:  Diagnosis Date  . Arthritis    "joints; shoulders, knees, hands, back" (05/21/2018)  . C. difficile diarrhea 04/2018  . Diastolic CHF ([place])   . High cholesterol   . History  of gout   . Hypertension   . Peripheral vascular disease ([place])   . Pneumonia    "couple times" (05/21/2018)  . Sleep apnea    "has mask; won't use" (05/21/2018)  . Type II diabetes mellitus ([place])       Past Surgical History:  Procedure Laterality Date  . CATARACT EXTRACTION W/ INTRAOCULAR LENS  IMPLANT, BILATERAL Bilateral   . CHOLECYSTECTOMY  05/21/2018   ATTEMPTED LAPAROSCOPIC CHOLECYSTECTOMY, OPEN DRAINAGE OF GALLBLADDER WITH BIOPSY  . CHOLECYSTECTOMY N/A 05/21/2018   Procedure: ATTEMPTED LAPAROSCOPIC CHOLECYSTECTOMY, OPEN DRAINAGE OF GALLBLADDER WITH BIOPSY;  Surgeon: Jovita Kussmaul, MD;  Location: [facility];  Service: General;  Laterality: N/A;  . COLONOSCOPY W/ POLYPECTOMY    . IR CATHETER TUBE CHANGE   04/14/2018  . IR CHOLANGIOGRAM EXISTING TUBE  03/17/2018  . IR PERC CHOLECYSTOSTOMY  01/31/2018  . IR RADIOLOGIST EVAL & MGMT  03/02/2018      Family History  Problem Relation Age of Onset  . Diabetes Mother   . Hypertension Mother   . Heart disease Father   . Heart attack Father   . Diabetes Sister   . Hypertension Sister   . Diabetes Brother   . Heart disease Brother   . Hypertension Brother   . Heart attack Brother       Social History   Socioeconomic History  . Marital status: Married    Spouse name: Not on file  . Number of children: Not on file  . Years of education: Not on file  . Highest education level: Not on file  Occupational History  . Not on file  Social Needs  . Financial resource strain: Not on file  . Food insecurity:    Worry: Not on file    Inability: Not on file  . Transportation needs:    Medical: Not on file    Non-medical: Not on file  Tobacco Use  . Smoking status: Former Smoker    Years: 24.00    Types: Cigarettes    Last attempt to quit: 11/25/1983    Years since quitting: 34.6  . Smokeless tobacco: Never Used  Substance and Sexual Activity  . Alcohol use: Not Currently  . Drug use: No  . Sexual activity: Not on file  Lifestyle  . Physical activity:    Days per week: Not on file    Minutes per session: Not on file  . Stress: Not on file  Relationships  . Social connections:    Talks on phone: Not on file    Gets together: Not on file    Attends religious service: Not on file    Active member of club or organization: Not on file    Attends meetings of clubs or organizations: Not on file    Relationship status: Not on file  . Intimate partner violence:    Fear of current or ex partner: No    Emotionally abused: No    Physically abused: No    Forced sexual activity: No  Other Topics Concern  . Not on file  Social History Narrative  . Not on file      Review of Systems  Constitutional: Negative for chills, fatigue, fever  and unexpected weight change.  Respiratory: Negative for chest tightness, shortness of breath and wheezing.   Cardiovascular: Negative for chest pain, palpitations and leg swelling.  Gastrointestinal: Negative for abdominal distention, abdominal pain, blood in stool, constipation, diarrhea, nausea and vomiting.       Objective:  BP (!) 185/75   Pulse 64   Temp 98.4 F (36.9 C)   Ht '5\' 9"'$  (1.753 m)   Wt 157 lb (71.2 kg)   BMI 23.18 kg/m  Nursing note and vital signs reviewed.  Physical Exam  Constitutional: He is oriented to person, place, and time. He appears well-developed and well-nourished. No distress.  Cardiovascular: Normal rate, regular rhythm, normal heart sounds and intact distal pulses. Exam reveals no gallop and no friction rub.  No murmur heard. Pulmonary/Chest: Effort normal and breath sounds normal. No stridor. No respiratory distress. He has no wheezes. He has no rales. He exhibits no tenderness.  Abdominal: Soft. Bowel sounds are normal. He exhibits no distension and no mass. There is no tenderness. There is no guarding.  Surgical incisions/drain sites appear well approximated and healed with no drainage, odor of evidence of infection.   Neurological: He is alert and oriented to person, place, and time.  Skin: Skin is warm and dry.  Psychiatric: He has a normal mood and affect.        Assessment & Plan:   Problem List Items Addressed This Visit      Digestive   Clostridium difficile colitis - Primary    Mr. Dault has re-current Clostridium difficile colitis and is improving with his vancomycin. He has just over 3 weeks of oral vancomycin remaining and is adherent to the regimen with no adverse side effects. Stools are now formed and frequency of bowel movements is 1x per day. Eating and drinking adequately. Continue current vancomycin taper. Stool kit and lab orders entered if symptoms of diarrhea return. Plan for follow up with Dr. Baxter Flattery in 1 month or  sooner if needed.       Relevant Orders   Clostridium difficile Toxin B, Qualitative, Real-Time PCR([facility])   Clostridium difficile EIA       I am having Dicie Beam maintain his simvastatin, aspirin, timolol, brimonidine, glimepiride, losartan, metoprolol succinate, multivitamin with minerals, feeding supplement (ENSURE ENLIVE), vancomycin, and HYDROcodone-acetaminophen.   Follow-up: Return in about 1 month (around 08/28/2018), or if symptoms worsen or fail to improve.    Terri Piedra, MSN, FNP-C Nurse Practitioner [facility] Office phone: [phone] Pager: [place] number: [phone]

## 2018-07-29 NOTE — Patient Instructions (Signed)
Nice to meet you.   Continue to take your vancomycin as prescribed.  Please make a follow up appointment with Dr. Drue Second in about 4 weeks.   Clostridium Difficile Infection Clostridium difficile (C. difficile or C. diff) infection causes inflammation of the large intestine (colon). This condition can result in damage to the lining of your colon and may lead to another condition called colitis. This infection can be passed from person to person (is contagious). Follow these instructions at home: Eating and drinking  Drink enough fluid to keep your pee (urine) clear or pale yellow.  Avoid drinking: ? Milk. ? Caffeine. ? Alcohol.  Follow exact instructions from your doctor about how to get enough fluid in your body (rehydrate).  Eat small meals often instead of large meals. Medicines  Take your antibiotic medicine as told by your doctor. Do not stop taking the antibiotic even if you start to feel better unless your doctor told you to do that.  Take over-the-counter and prescription medicines only as told by your doctor.  Do not use medicines to help with watery poop (diarrhea). General instructions  Wash your hands fully before you prepare food and after you use the bathroom. Make sure people who live with you also wash their  hands often.  Clean the surfaces that you touch. Use a product that contains chlorine bleach.  Keep all follow-up visits as told by your doctor. This is important. Contact a doctor if:  Your symptoms do not get better with treatment.  Your symptoms get worse with treatment.  Your symptoms go away and then come back.  You have a fever.  You have new symptoms. Get help right away if:  You have more pain or tenderness in your belly (abdomen).  Your poop (stool) is mostly bloody.  Your poop looks dark black and tarry.  You cannot eat or drink without throwing up (vomiting).  You have signs of dehydration, such as: ? Dark pee, very little  pee, or no pee. ? Cracked lips. ? Not making tears when you cry. ? Dry mouth. ? Sunken eyes. ? Feeling sleepy. ? Feeling weak. ? Feeling dizzy. This information is not intended to replace advice given to you by your health care provider. Make sure you discuss any questions you have with your health care provider. Document Released: 09/07/2009 Document Revised: 04/17/2016 Document Reviewed: 05/14/2015 Elsevier Interactive Patient Education  2017 [employer].

## 2018-07-29 NOTE — Assessment & Plan Note (Signed)
Jeremiah [name] has re-current Clostridium difficile colitis and is improving with his vancomycin. He has just over 3 weeks of oral vancomycin remaining and is adherent to the regimen with no adverse side effects. Stools are now formed and frequency of bowel movements is 1x per day. Eating and drinking adequately. Continue current vancomycin taper. Stool kit and lab orders entered if symptoms of diarrhea return. Plan for follow up with Dr. Baxter Flattery in 1 month or sooner if needed.

## 2018-08-03 ENCOUNTER — Encounter: Payer: Self-pay | Admitting: *Deleted

## 2018-08-06 MED FILL — VANCOMYCIN 50MG/ML ORAL SOL: 50MG/1ML | 14 days supply | Qty: 100 | Fill #0

## 2018-08-26 ENCOUNTER — Telehealth: Payer: Self-pay | Admitting: *Deleted

## 2018-08-26 NOTE — Telephone Encounter (Signed)
Error in charting.

## 2018-09-02 ENCOUNTER — Other Ambulatory Visit: Payer: Medicare Other

## 2018-09-02 ENCOUNTER — Telehealth: Payer: Self-pay | Admitting: *Deleted

## 2018-09-02 DIAGNOSIS — A0472 Enterocolitis due to Clostridium difficile, not specified as recurrent: Secondary | ICD-10-CM

## 2018-09-02 NOTE — Telephone Encounter (Signed)
Wife called and advised the patient stopped oral Vanc 08/24/18 and the diarrhea has started back and comes 3+ times a day. She has a stool kit and wants to know what to do. Advised her on how to use the stool kit and to drop it off here. Also advised her will let Dr Baxter Flattery know the diarrhea is back and to see what she would like for the patient to do after stool is collected.

## 2018-09-03 ENCOUNTER — Other Ambulatory Visit: Payer: Self-pay | Admitting: *Deleted

## 2018-09-03 ENCOUNTER — Telehealth: Payer: Self-pay | Admitting: *Deleted

## 2018-09-03 DIAGNOSIS — A0472 Enterocolitis due to Clostridium difficile, not specified as recurrent: Secondary | ICD-10-CM

## 2018-09-03 LAB — CLOSTRIDIUM DIFFICILE TOXIN B, QUALITATIVE, REAL-TIME PCR: Toxigenic C. Difficile by PCR: DETECTED — AB

## 2018-09-03 MED ORDER — VANCOMYCIN HCL 125 MG PO CAPS: 125.0000 mg | ORAL_CAPSULE | Freq: Four times a day (QID) | ORAL | 0 refills | Status: DC

## 2018-09-03 MED FILL — VANCOMYCIN 50MG/ML ORAL SOL: 14 days supply | Qty: 140 | Fill #0

## 2018-09-03 NOTE — Telephone Encounter (Signed)
Jeremiah [name] Jeremiah [name] called in oral vanco 125mg  QID x 14 days. Will need to see him next week and set up for fecal transplant.

## 2018-09-03 NOTE — Telephone Encounter (Signed)
Per verbal order from Dr Drue Second, called in prescription to [facility] for oral vancomycin, 125 mg four times a day for 14 days.  Prescription sent electronically as well.   Per conversation with patient's wife yesterday, they do have vancomycin left over at home.  Unable to contact patient this morning to let them know to restart the medication ("call cannot be completed at this time"). Will continue to try. Andree Coss, RN

## 2018-09-04 ENCOUNTER — Telehealth: Payer: Self-pay | Admitting: Infectious Diseases

## 2018-09-04 NOTE — Telephone Encounter (Signed)
called by lab that pt has +C diff toxin.  Noted that he already has rx.  Called and confirmed pt has rx and is taking. He will pick up refill on 10-14

## 2018-09-07 ENCOUNTER — Ambulatory Visit (INDEPENDENT_AMBULATORY_CARE_PROVIDER_SITE_OTHER): Payer: Medicare Other | Admitting: Internal Medicine

## 2018-09-07 ENCOUNTER — Encounter: Payer: Self-pay | Admitting: Internal Medicine

## 2018-09-07 VITALS — BP 197/81 | HR 62 | Temp 97.8°F | Ht 69.0 in | Wt 162.8 lb

## 2018-09-07 DIAGNOSIS — A0472 Enterocolitis due to Clostridium difficile, not specified as recurrent: Secondary | ICD-10-CM

## 2018-09-10 NOTE — Progress Notes (Signed)
RFV: follow up for recurrence of cdifficile Patient ID: Jeremiah [name], male   DOB: 11-23-1946, 72 y.o.   MRN: [id number]  HPI 72yo M with recurrent cdifficile. He was hospitalized this summer for cdifficile colitis and placed on prolonged oral vanco taper, which he just finished roughly 2-3 wks ago. He reports that had quick onset of watery stools with some abodominal cramping roughly 3 days ago. His repeat testing showed +Cdiff PCR. He started oral vancomycin yesterday and started to notice improvement in stools. Only one loose stool. He is has not gained back all is weight loss from this summer. He is here with son and wife to discuss FMT  Outpatient Encounter Medications as of 09/07/2018  Medication Sig  . aspirin 81 MG tablet Take 81 mg by mouth daily.  . brimonidine (ALPHAGAN) 0.2 % ophthalmic solution Place 1 drop into both eyes 2 (two) times daily.   Marland Kitchen glimepiride (AMARYL) 4 MG tablet Take 1 tablet (4 mg total) by mouth daily with breakfast. (Patient taking differently: Take 4 mg by mouth daily. )  . HYDROcodone-acetaminophen (NORCO/VICODIN) 5-325 MG tablet Take 1-2 tablets by mouth every 6 (six) hours as needed for pain.  Marland Kitchen losartan (COZAAR) 100 MG tablet Take 100 mg by mouth daily.  . metoprolol succinate (TOPROL-XL) 50 MG 24 hr tablet Take 1 tablet (50 mg total) by mouth daily. Take with or immediately following a meal.  . simvastatin (ZOCOR) 40 MG tablet Take 40 mg by mouth daily.  . timolol (TIMOPTIC) 0.5 % ophthalmic solution Place 1 drop into both eyes 2 (two) times daily.  . vancomycin (VANCOCIN) 125 MG capsule Take 1 capsule (125 mg total) by mouth 4 (four) times daily for 14 days.  . vancomycin (VANCOCIN) 50 mg/mL oral solution Take 2.5 mLs (125 mg total) by mouth every 6 (six) hours. Oral vancomycin taper 125mg  every six hours x 14 days, followed by 125mg  every 8 hours x 7 days, then 125mg  twice daily x 7 days, then 125mg  daily x 7 days.   No facility-administered  encounter medications on file as of 09/07/2018.      Patient Active Problem List   Diagnosis Date Noted  . Altered mental status   . Acute cystitis without hematuria   . Leukocytosis   . Pressure injury of skin 07/08/2018  . Clostridium difficile colitis 07/07/2018  . Hypokalemia 07/07/2018  . HLD (hyperlipidemia) 07/07/2018  . Acute diverticulitis 06/04/2018  . Cholecystitis 05/24/2018  . Cholecystitis with cholelithiasis 05/21/2018  . Acute systolic heart failure (HCC) 03/27/2018  . Diabetes mellitus without complication (HCC) 01/30/2018  . Hypertension 01/30/2018  . Acute cholecystitis 01/30/2018  . AKI (acute kidney injury) (HCC) 01/30/2018  . Normocytic anemia 01/30/2018  . PVD (peripheral vascular disease) (HCC) 09/05/2014     Health Maintenance Due  Topic Date Due  . Hepatitis C Screening  1946-01-30  . FOOT EXAM  02/14/1954  . OPHTHALMOLOGY EXAM  02/14/1954  . TETANUS/TDAP  02/15/1963  . COLONOSCOPY  02/14/1994  . PNA vac Low Risk Adult (1 of 2 - PCV13) 02/14/2009  . INFLUENZA VACCINE  06/24/2018     Review of Systems Review of Systems  Constitutional: Negative for fever, chills, diaphoresis, activity change, appetite change, fatigue and unexpected weight change.  HENT: Negative for congestion, sore throat, rhinorrhea, sneezing, trouble swallowing and sinus pressure.  Eyes: Negative for photophobia and visual disturbance.  Respiratory: Negative for cough, chest tightness, shortness of breath, wheezing and stridor.  Cardiovascular: Negative for chest  pain, palpitations and leg swelling.  Gastrointestinal: +diarrhea Genitourinary: Negative for dysuria, hematuria, flank pain and difficulty urinating.  Musculoskeletal: Negative for myalgias, back pain, joint swelling, arthralgias and gait problem.  Skin: Negative for color change, pallor, rash and wound.  Neurological: Negative for dizziness, tremors, weakness and light-headedness.  Hematological: Negative for  adenopathy. Does not bruise/bleed easily.  Psychiatric/Behavioral: Negative for behavioral problems, confusion, sleep disturbance, dysphoric mood, decreased concentration and agitation.    Physical Exam   BP (!) 197/81   Pulse 62   Temp 97.8 F (36.6 C)   Ht 5\' 9"  (1.753 m)   Wt 162 lb 12.8 oz (73.8 kg)   BMI 24.04 kg/m   Physical Exam  Constitutional: He is oriented to person, place, and time. He appears well-developed and well-nourished. No distress.  HENT:  Mouth/Throat: Oropharynx is clear and moist. No oropharyngeal exudate.  Cardiovascular: Normal rate, regular rhythm and normal heart sounds. Exam reveals no gallop and no friction rub.  No murmur heard.  Pulmonary/Chest: Effort normal and breath sounds normal. No respiratory distress. He has no wheezes.  Abdominal: Soft. Bowel sounds are normal. He exhibits no distension. There is no tenderness.  Lymphadenopathy:  He has no cervical adenopathy.  Neurological: He is alert and oriented to person, place, and time.  Skin: Skin is warm and dry. No rash noted. No erythema.  Psychiatric: He has a normal mood and affect. His behavior is normal.    CBC Lab Results  Component Value Date   WBC 17.0 (H) 07/20/2018   RBC 3.71 (L) 07/20/2018   HGB 10.0 (L) 07/20/2018   HCT 30.6 (L) 07/20/2018   PLT 370 07/20/2018   MCV 82.5 07/20/2018   MCH 27.0 07/20/2018   MCHC 32.7 07/20/2018   RDW 16.5 (H) 07/20/2018   LYMPHSABS 3.6 07/20/2018   MONOABS 1.3 (H) 07/20/2018   EOSABS 1.1 (H) 07/20/2018    BMET Lab Results  Component Value Date   NA 141 07/20/2018   K 3.9 07/20/2018   CL 102 07/20/2018   CO2 30 07/20/2018   GLUCOSE 80 07/20/2018   BUN 8 07/20/2018   CREATININE 1.06 07/20/2018   CALCIUM 9.1 07/20/2018   GFRNONAA >60 07/20/2018   GFRAA >60 07/20/2018      Assessment and Plan  Recurrent cdifficile = continue on oral vanco 125mg i QID with plan to move forward with FMT. Will discuss with dr Leone Payor and dr schooler  who has done the patient's previous colonoscopy  Weight loss = encourage good appetite, increasing fat and protein.  Spent 35 min with patient discussing hte FMT process, FDA regulations and informed consent process

## 2018-09-16 ENCOUNTER — Ambulatory Visit (HOSPITAL_COMMUNITY): Payer: Medicare Other | Admitting: Certified Registered Nurse Anesthetist

## 2018-09-16 ENCOUNTER — Encounter (HOSPITAL_COMMUNITY)
Admission: RE | Disposition: A | Discharge status: Home / Self Care | Payer: Self-pay | Source: Ambulatory Visit | Attending: Gastroenterology

## 2018-09-16 ENCOUNTER — Other Ambulatory Visit: Payer: Self-pay

## 2018-09-16 ENCOUNTER — Encounter (HOSPITAL_COMMUNITY): Payer: Self-pay | Admitting: Certified Registered Nurse Anesthetist

## 2018-09-16 ENCOUNTER — Ambulatory Visit (HOSPITAL_COMMUNITY)
Admission: RE | Admit: 2018-09-16 | Discharge: 2018-09-16 | Disposition: A | Discharge status: Home / Self Care | Payer: Medicare Other | Source: Ambulatory Visit | Attending: Gastroenterology | Admitting: Gastroenterology

## 2018-09-16 DIAGNOSIS — I5032 Chronic diastolic (congestive) heart failure: Secondary | ICD-10-CM | POA: Insufficient documentation

## 2018-09-16 DIAGNOSIS — Z87891 Personal history of nicotine dependence: Secondary | ICD-10-CM | POA: Diagnosis not present

## 2018-09-16 DIAGNOSIS — A0471 Enterocolitis due to Clostridium difficile, recurrent: Secondary | ICD-10-CM | POA: Diagnosis not present

## 2018-09-16 DIAGNOSIS — K5730 Diverticulosis of large intestine without perforation or abscess without bleeding: Secondary | ICD-10-CM | POA: Diagnosis not present

## 2018-09-16 DIAGNOSIS — E1151 Type 2 diabetes mellitus with diabetic peripheral angiopathy without gangrene: Secondary | ICD-10-CM | POA: Diagnosis not present

## 2018-09-16 DIAGNOSIS — Z8249 Family history of ischemic heart disease and other diseases of the circulatory system: Secondary | ICD-10-CM | POA: Insufficient documentation

## 2018-09-16 DIAGNOSIS — M19012 Primary osteoarthritis, left shoulder: Secondary | ICD-10-CM | POA: Diagnosis not present

## 2018-09-16 DIAGNOSIS — Z86010 Personal history of colonic polyps: Secondary | ICD-10-CM | POA: Insufficient documentation

## 2018-09-16 DIAGNOSIS — G4730 Sleep apnea, unspecified: Secondary | ICD-10-CM | POA: Insufficient documentation

## 2018-09-16 DIAGNOSIS — M19011 Primary osteoarthritis, right shoulder: Secondary | ICD-10-CM | POA: Insufficient documentation

## 2018-09-16 DIAGNOSIS — R197 Diarrhea, unspecified: Secondary | ICD-10-CM | POA: Diagnosis present

## 2018-09-16 DIAGNOSIS — K640 First degree hemorrhoids: Secondary | ICD-10-CM | POA: Diagnosis not present

## 2018-09-16 DIAGNOSIS — I110 Hypertensive heart disease with heart failure: Secondary | ICD-10-CM | POA: Insufficient documentation

## 2018-09-16 DIAGNOSIS — M19042 Primary osteoarthritis, left hand: Secondary | ICD-10-CM | POA: Insufficient documentation

## 2018-09-16 DIAGNOSIS — M19041 Primary osteoarthritis, right hand: Secondary | ICD-10-CM | POA: Diagnosis not present

## 2018-09-16 DIAGNOSIS — E7800 Pure hypercholesterolemia, unspecified: Secondary | ICD-10-CM | POA: Insufficient documentation

## 2018-09-16 DIAGNOSIS — Z7984 Long term (current) use of oral hypoglycemic drugs: Secondary | ICD-10-CM | POA: Insufficient documentation

## 2018-09-16 DIAGNOSIS — Z9119 Patient's noncompliance with other medical treatment and regimen: Secondary | ICD-10-CM | POA: Insufficient documentation

## 2018-09-16 DIAGNOSIS — Z79899 Other long term (current) drug therapy: Secondary | ICD-10-CM | POA: Insufficient documentation

## 2018-09-16 DIAGNOSIS — M170 Bilateral primary osteoarthritis of knee: Secondary | ICD-10-CM | POA: Diagnosis not present

## 2018-09-16 DIAGNOSIS — M479 Spondylosis, unspecified: Secondary | ICD-10-CM | POA: Diagnosis not present

## 2018-09-16 DIAGNOSIS — Z7982 Long term (current) use of aspirin: Secondary | ICD-10-CM | POA: Diagnosis not present

## 2018-09-16 HISTORY — PX: COLONOSCOPY WITH PROPOFOL: SHX5780

## 2018-09-16 HISTORY — PX: FECAL TRANSPLANT: SHX6383

## 2018-09-16 LAB — GLUCOSE, CAPILLARY: Glucose-Capillary: 84 mg/dL (ref 70–99)

## 2018-09-16 SURGERY — COLONOSCOPY WITH PROPOFOL
Anesthesia: Monitor Anesthesia Care

## 2018-09-16 MED ORDER — LOPERAMIDE HCL 2 MG PO CAPS
4.0000 mg | ORAL_CAPSULE | Freq: Once | ORAL | Status: AC
  Administered 2018-09-16: 4 mg via ORAL
  Filled 2018-09-16: qty 2

## 2018-09-16 MED ORDER — PROPOFOL 10 MG/ML IV BOLUS
INTRAVENOUS | Status: AC
  Filled 2018-09-16: qty 40

## 2018-09-16 MED ORDER — OXYCODONE HCL 5 MG/5ML PO SOLN: 5.0000 mg | Freq: Once | ORAL | Status: DC | PRN

## 2018-09-16 MED ORDER — OXYCODONE HCL 5 MG PO TABS: 5.0000 mg | ORAL_TABLET | Freq: Once | ORAL | Status: DC | PRN

## 2018-09-16 MED ORDER — PROPOFOL 500 MG/50ML IV EMUL
INTRAVENOUS | Status: DC | PRN
  Administered 2018-09-16: 150 ug/kg/min via INTRAVENOUS

## 2018-09-16 MED ORDER — FENTANYL CITRATE (PF) 100 MCG/2ML IJ SOLN: 25.0000 ug | INTRAMUSCULAR | Status: DC | PRN

## 2018-09-16 MED ORDER — PHENYLEPHRINE HCL 10 MG/ML IJ SOLN
INTRAMUSCULAR | Status: DC | PRN
  Administered 2018-09-16: 100 ug via INTRAVENOUS

## 2018-09-16 MED ORDER — ONDANSETRON HCL 4 MG/2ML IJ SOLN: 4.0000 mg | Freq: Once | INTRAMUSCULAR | Status: DC | PRN

## 2018-09-16 MED ORDER — PROPOFOL 500 MG/50ML IV EMUL
INTRAVENOUS | Status: DC | PRN
  Administered 2018-09-16: 30 mg via INTRAVENOUS

## 2018-09-16 MED ORDER — LACTATED RINGERS IV SOLN
INTRAVENOUS | Status: DC
  Administered 2018-09-16: 1000 mL via INTRAVENOUS

## 2018-09-16 SURGICAL SUPPLY — 22 items

## 2018-09-16 NOTE — H&P (Signed)
Referring Provider: Dr. Drue Second Primary Care Physician:  Rinaldo Cloud, MD Primary Gastroenterologist:  Dr. Bosie Clos  Reason for Consultation:  C. Diff colitis  HPI: Jeremiah [name] is a 72 y.o. male here for a colonoscopy for a fecal transplant. Has been having profuse watery stools when he is off of PO Vancomycin but reports more formed stools as long as he stays on the PO Vanco. Repeat stool test positive for C. Diff on PCR. Denies abdominal pain, N/V, rectal bleeding. Reports 40-60 pound unintentional weight loss in the past 4 months. Last colonoscopy in 03/2014 showed hyperplastic polyps, diverticulosis, and internal hemorrhoids. Denies chest pain or shortness of breath. PO Vanco stopped 2 days ago for this procedure.   Past Medical History:  Diagnosis Date  . Arthritis    "joints; shoulders, knees, hands, back" (05/21/2018)  . C. difficile diarrhea 04/2018  . Diastolic CHF (HCC)   . High cholesterol   . History of gout   . Hypertension   . Peripheral vascular disease (HCC)   . Pneumonia    "couple times" (05/21/2018)  . Sleep apnea    "has mask; won't use" (05/21/2018)  . Type II diabetes mellitus (HCC)     Past Surgical History:  Procedure Laterality Date  . CATARACT EXTRACTION W/ INTRAOCULAR LENS  IMPLANT, BILATERAL Bilateral   . CHOLECYSTECTOMY  05/21/2018   ATTEMPTED LAPAROSCOPIC CHOLECYSTECTOMY, OPEN DRAINAGE OF GALLBLADDER WITH BIOPSY  . CHOLECYSTECTOMY N/A 05/21/2018   Procedure: ATTEMPTED LAPAROSCOPIC CHOLECYSTECTOMY, OPEN DRAINAGE OF GALLBLADDER WITH BIOPSY;  Surgeon: Griselda Miner, MD;  Location: [facility] OR;  Service: General;  Laterality: N/A;  . COLONOSCOPY W/ POLYPECTOMY    . IR CATHETER TUBE CHANGE  04/14/2018  . IR CHOLANGIOGRAM EXISTING TUBE  03/17/2018  . IR PERC CHOLECYSTOSTOMY  01/31/2018  . IR RADIOLOGIST EVAL & MGMT  03/02/2018    Prior to Admission medications   Medication Sig Start Date End Date Taking? Authorizing Provider  acetaminophen (TYLENOL) 500 MG  tablet Take 1,000 mg by mouth every 6 (six) hours as needed for moderate pain or headache.   Yes [provider]  aspirin 81 MG tablet Take 81 mg by mouth daily.   Yes [provider]  brimonidine (ALPHAGAN) 0.2 % ophthalmic solution Place 1 drop into both eyes 2 (two) times daily.    Yes [provider]  glimepiride (AMARYL) 4 MG tablet Take 1 tablet (4 mg total) by mouth daily with breakfast. Patient taking differently: Take 4 mg by mouth 2 (two) times daily.  02/02/18  Yes Buriev, Isaiah Serge, MD  losartan (COZAAR) 100 MG tablet Take 100 mg by mouth daily. 03/01/18  Yes [provider]  metoprolol succinate (TOPROL-XL) 50 MG 24 hr tablet Take 1 tablet (50 mg total) by mouth daily. Take with or immediately following a meal. 04/02/18  Yes Rinaldo Cloud, MD  Multiple Vitamin (MULTIVITAMIN WITH MINERALS) TABS tablet Take 1 tablet by mouth daily.   Yes [provider]  simvastatin (ZOCOR) 40 MG tablet Take 40 mg by mouth daily.   Yes [provider]  timolol (TIMOPTIC) 0.5 % ophthalmic solution Place 1 drop into both eyes 2 (two) times daily. 01/27/18  Yes [provider]  vancomycin (VANCOCIN) 125 MG capsule Take 1 capsule (125 mg total) by mouth 4 (four) times daily for 14 days. Patient not taking: Reported on 09/15/2018 09/03/18 09/17/18  Judyann Munson, MD  vancomycin (VANCOCIN) 50 mg/mL oral solution Take 2.5 mLs (125 mg total) by mouth every 6 (  six) hours. Oral vancomycin taper 125mg  every six hours x 14 days, followed by 125mg  every 8 hours x 7 days, then 125mg  twice daily x 7 days, then 125mg  daily x 7 days. Patient not taking: Reported on 09/15/2018 07/11/18   Arrien, York Ram, MD    Scheduled Meds: Continuous Infusions: . lactated ringers 1,000 mL (09/16/18 0718)   PRN Meds:.  Allergies as of 09/14/2018  . (No Known Allergies)    Family History  Problem Relation Age of Onset  . Diabetes Mother   . Hypertension Mother    . Heart disease Father   . Heart attack Father   . Diabetes Sister   . Hypertension Sister   . Diabetes Brother   . Heart disease Brother   . Hypertension Brother   . Heart attack Brother     Social History   Socioeconomic History  . Marital status: Married    Spouse name: Not on file  . Number of children: Not on file  . Years of education: Not on file  . Highest education level: Not on file  Occupational History  . Not on file  Social Needs  . Financial resource strain: Not on file  . Food insecurity:    Worry: Not on file    Inability: Not on file  . Transportation needs:    Medical: Not on file    Non-medical: Not on file  Tobacco Use  . Smoking status: Former Smoker    Years: 24.00    Types: Cigarettes    Last attempt to quit: 11/25/1983    Years since quitting: 34.8  . Smokeless tobacco: Never Used  Substance and Sexual Activity  . Alcohol use: Not Currently  . Drug use: No  . Sexual activity: Not on file  Lifestyle  . Physical activity:    Days per week: Not on file    Minutes per session: Not on file  . Stress: Not on file  Relationships  . Social connections:    Talks on phone: Not on file    Gets together: Not on file    Attends religious service: Not on file    Active member of club or organization: Not on file    Attends meetings of clubs or organizations: Not on file    Relationship status: Not on file  . Intimate partner violence:    Fear of current or ex partner: No    Emotionally abused: No    Physically abused: No    Forced sexual activity: No  Other Topics Concern  . Not on file  Social History Narrative  . Not on file    Review of Systems: All negative except as stated above in HPI.  Physical Exam: Vital signs: Vitals:   09/16/18 0707  BP: (!) 212/78  Pulse: 77  Resp: 19  Temp: 97.9 F (36.6 C)  SpO2: 96%     General:   Alert,  Well-developed, well-nourished, pleasant and cooperative in NAD Head: normocephalic,  atraumatic Eyes: anicteric sclera ENT: oropharynx clear Neck: supple, nontender Lungs:  Clear throughout to auscultation.   No wheezes, crackles, or rhonchi. No acute distress. Heart:  Regular rate and rhythm; no murmurs, clicks, rubs,  or gallops. Abdomen: soft, nontender, nondistended, +BS  Rectal:  Deferred Ext: no edema  GI:  Lab Results: No results for input(s): WBC, HGB, HCT, PLT in the last 72 hours. BMET No results for input(s): NA, K, CL, CO2, GLUCOSE, BUN, CREATININE, CALCIUM in the last 72 hours.  LFT No results for input(s): PROT, ALBUMIN, AST, ALT, ALKPHOS, BILITOT, BILIDIR, IBILI in the last 72 hours. PT/INR No results for input(s): LABPROT, INR in the last 72 hours.   Studies/Results: No results found.  Impression/Plan: Recurrent C. Diff colitis with plan for fecal transplant during colonoscopy today at the request of Dr. Drue Second. Risks/benefits discussed with patient and he agrees to proceed.    LOS: 0 days   Shirley Friar  09/16/2018, 7:56 AM  Questions please call [phone]

## 2018-09-16 NOTE — Discharge Instructions (Signed)

## 2018-09-16 NOTE — Anesthesia Preprocedure Evaluation (Addendum)
Anesthesia Evaluation  Patient identified by MRN, date of birth, ID band Patient awake    Reviewed: Allergy & Precautions, NPO status , Patient's Chart, lab work & pertinent test results  Airway Mallampati: II  TM Distance: >3 FB Neck ROM: Full    Dental  (+) Edentulous Upper   Pulmonary former smoker,    breath sounds clear to auscultation       Cardiovascular hypertension,  Rhythm:Regular Rate:Normal     Neuro/Psych    GI/Hepatic   Endo/Other  diabetes  Renal/GU      Musculoskeletal   Abdominal   Peds  Hematology   Anesthesia Other Findings   Reproductive/Obstetrics                            Anesthesia Physical Anesthesia Plan  ASA: III  Anesthesia Plan: MAC   Post-op Pain Management:    Induction:   PONV Risk Score and Plan: Ondansetron and Propofol infusion  Airway Management Planned: Simple Face Mask and Natural Airway  Additional Equipment:   Intra-op Plan:   Post-operative Plan:   Informed Consent: I have reviewed the patients History and Physical, chart, labs and discussed the procedure including the risks, benefits and alternatives for the proposed anesthesia with the patient or authorized representative who has indicated his/her understanding and acceptance.     Plan Discussed with: CRNA and Anesthesiologist  Anesthesia Plan Comments:         Anesthesia Quick Evaluation

## 2018-09-16 NOTE — Transfer of Care (Signed)
Immediate Anesthesia Transfer of Care Note  Patient: Jeremiah [name]  Procedure(s) Performed: COLONOSCOPY WITH PROPOFOL (N/A ) FECAL TRANSPLANT (N/A )  Patient Location: PACU  Anesthesia Type:MAC  Level of Consciousness: sedated and drowsy  Airway & Oxygen Therapy: Patient Spontanous Breathing and Patient connected to face mask oxygen  Post-op Assessment: Report given to RN and Post -op Vital signs reviewed and stable  Post vital signs: Reviewed and stable  Last Vitals:  Vitals Value Taken Time  BP    Temp    Pulse    Resp    SpO2      Last Pain:  Vitals:   09/16/18 0707  TempSrc: Oral  PainSc: 0-No pain         Complications: No apparent anesthesia complications

## 2018-09-16 NOTE — Op Note (Signed)
[facility] Patient Name: Jeremiah [name] Procedure Date: 09/16/2018 MRN: [id number] Attending MD: Shirley Friar , MD Date of Birth: 10-May-1946 CSN: [id number] Age: 72 Admit Type: Outpatient Procedure:                Colonoscopy Indications:              Last colonoscopy: May 2015, Diarrhea, Recurrent C.                            diff Colitis; Fecal transplant Providers:                Shirley Friar, MD, Jacquiline Doe, RN, Zoila Shutter, Technician, Mirian Mo, CRNA Referring MD:             Dr. Drue Second Medicines:                Propofol per Anesthesia, Monitored Anesthesia Care Complications:            No immediate complications. Estimated Blood Loss:     Estimated blood loss: none. Procedure:                Pre-Anesthesia Assessment:                           - Prior to the procedure, a History and Physical                            was performed, and patient medications and                            allergies were reviewed. The patient's tolerance of                            previous anesthesia was also reviewed. The risks                            and benefits of the procedure and the sedation                            options and risks were discussed with the patient.                            All questions were answered, and informed consent                            was obtained. Prior Anticoagulants: The patient has                            taken no previous anticoagulant or antiplatelet                            agents. ASA Grade Assessment: III - A patient with  severe systemic disease. After reviewing the risks                            and benefits, the patient was deemed in                            satisfactory condition to undergo the procedure.                           After obtaining informed consent, the colonoscope                            was passed under direct vision.  Throughout the                            procedure, the patient's blood pressure, pulse, and                            oxygen saturations were monitored continuously. The                            PCF-H190DL ([id number]) Olympus peds colonscope was                            introduced through the anus and advanced to the the                            cecum, identified by appendiceal orifice and                            ileocecal valve. The colonoscopy was performed with                            difficulty due to significant looping and a                            tortuous colon. Successful completion of the                            procedure was aided by straightening and shortening                            the scope to obtain bowel loop reduction and                            applying abdominal pressure. The patient tolerated                            the procedure well. The quality of the bowel                            preparation was fair. The ileocecal valve,  appendiceal orifice, and rectum were photographed. Scope In: 8:12:37 AM Scope Out: 8:30:05 AM Scope Withdrawal Time: 0 hours 3 minutes 40 seconds  Total Procedure Duration: 0 hours 17 minutes 28 seconds  Findings:      The perianal and digital rectal examinations were normal.      Scattered small and large-mouthed diverticula were found in the entire       colon.      Internal hemorrhoids were found during retroflexion. The hemorrhoids       were medium-sized and Grade I (internal hemorrhoids that do not       prolapse).      Upon reaching the cecum the syringes of donor stool were infused in the       cecum and the colonoscope was withdrawn without any suctioning on       withdrawal. Impression:               - Preparation of the colon was fair.                           - Diverticulosis in the entire examined colon.                           - Internal hemorrhoids.                            - No specimens collected. Moderate Sedation:      N/A- Per Anesthesia Care Recommendation:           - Patient has a contact number available for                            emergencies. The signs and symptoms of potential                            delayed complications were discussed with the                            patient. Return to normal activities tomorrow.                            Written discharge instructions were provided to the                            patient.                           - Resume previous diet.                           - Repeat colonoscopy in 2025 for screening purposes. Procedure Code(s):        --- Professional ---                           [phone], Colonoscopy, flexible; diagnostic, including                            collection of specimen(s) by brushing or washing,  when performed (separate procedure) Diagnosis Code(s):        --- Professional ---                           R19.7, Diarrhea, unspecified                           K64.0, First degree hemorrhoids                           K57.30, Diverticulosis of large intestine without                            perforation or abscess without bleeding CPT copyright 2018 American Medical Association. All rights reserved. The codes documented in this report are preliminary and upon coder review may  be revised to meet current compliance requirements. Shirley Friar, MD 09/16/2018 8:38:28 AM This report has been signed electronically. Number of Addenda: 0

## 2018-09-16 NOTE — Interval H&P Note (Signed)
History and Physical Interval Note:  09/16/2018 8:02 AM  Jeremiah [name]  has presented today for surgery, with the diagnosis of c-diff  The various methods of treatment have been discussed with the patient and family. After consideration of risks, benefits and other options for treatment, the patient has consented to  Procedure(s): COLONOSCOPY WITH PROPOFOL (N/A) FECAL TRANSPLANT (N/A) as a surgical intervention .  The patient's history has been reviewed, patient examined, no change in status, stable for surgery.  I have reviewed the patient's chart and labs.  Questions were answered to the patient's satisfaction.     Shirley Friar

## 2018-09-16 NOTE — Anesthesia Postprocedure Evaluation (Signed)
Anesthesia Post Note  Patient: Jeremiah [name]  Procedure(s) Performed: COLONOSCOPY WITH PROPOFOL (N/A ) FECAL TRANSPLANT (N/A )     Patient location during evaluation: PACU Anesthesia Type: MAC Level of consciousness: awake and alert Pain management: pain level controlled Vital Signs Assessment: post-procedure vital signs reviewed and stable Respiratory status: spontaneous breathing, nonlabored ventilation, respiratory function stable and patient connected to nasal cannula oxygen Cardiovascular status: stable and blood pressure returned to baseline Postop Assessment: no apparent nausea or vomiting Anesthetic complications: no    Last Vitals:  Vitals:   09/16/18 0850 09/16/18 0900  BP: (!) 143/57 (!) 161/67  Pulse: 76 75  Resp: 14 20  Temp:    SpO2: 100% 98%    Last Pain:  Vitals:   09/16/18 0707  TempSrc: Oral  PainSc: 0-No pain                 Dhiren Azimi COKER

## 2018-09-21 ENCOUNTER — Telehealth: Payer: Self-pay | Admitting: Behavioral Health

## 2018-09-21 NOTE — Telephone Encounter (Signed)
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  He had his first bowel movement since the 24th on the 28th which patient described as diarrhea x3 and today diarrhea x2.  Patient denies fever however wanted to know if this was a normal response after a fecal transplant. Angeline Slim RN

## 2018-09-22 NOTE — Telephone Encounter (Addendum)
Patient wife called to advise that the patient has had slightly loose stools for the past 2 days. She advised he had 3 on 09/20/18 and 2 on 09/21/18. They do not smell bad and are not watery but she is concerned. Asked about diet and she advised he has been eating applesauce and bananas since procedure. And on 09/20/18 she fed him cabbage also. Advised he should be on a regular diet so he can form stools. Just limit him to no spicy, greasy or high fat food and he should be fine. Also if he starts to have more that 4-6 loose stools daily to call the office. Advised will let Dr Drue Second know about our conversation and call the patient back once she responds.

## 2018-09-22 NOTE — Telephone Encounter (Signed)
Thanks for giving them dietary restrictions. We did a fecal transplant on him last Thursday so I hope that it holds. If he does have 4-6 watery stools, I would like to retest him for cdiff. Can you call them, and have him take 1 dose of immodium tomorrow to see if that improves his symptoms.

## 2018-09-23 NOTE — Telephone Encounter (Signed)
Per Dr Drue Second response called patient wife and she told her what Dr Drue Second wants her to do with the imodium and what to look for as to when to call the office. She advised

## 2018-09-30 ENCOUNTER — Ambulatory Visit: Payer: Medicare Other | Admitting: Internal Medicine

## 2018-10-05 ENCOUNTER — Ambulatory Visit: Payer: Medicare Other | Admitting: Internal Medicine

## 2018-10-08 ENCOUNTER — Telehealth: Payer: Self-pay | Admitting: *Deleted

## 2018-10-08 NOTE — Telephone Encounter (Signed)
Patient wife called to report that the patient is constipated and can not have a bowel movement. She says he also has hemmhroids and wants to know what he can take. Advised will have to ask the provider and give her a call back.

## 2018-10-14 ENCOUNTER — Encounter: Payer: Self-pay | Admitting: Podiatry

## 2018-10-14 ENCOUNTER — Ambulatory Visit: Payer: Medicare Other | Admitting: Podiatry

## 2018-10-14 ENCOUNTER — Telehealth: Payer: Self-pay | Admitting: Podiatry

## 2018-10-14 ENCOUNTER — Telehealth: Payer: Self-pay | Admitting: *Deleted

## 2018-10-14 ENCOUNTER — Ambulatory Visit (INDEPENDENT_AMBULATORY_CARE_PROVIDER_SITE_OTHER): Payer: Medicare Other

## 2018-10-14 DIAGNOSIS — E11621 Type 2 diabetes mellitus with foot ulcer: Secondary | ICD-10-CM | POA: Diagnosis not present

## 2018-10-14 DIAGNOSIS — L97411 Non-pressure chronic ulcer of right heel and midfoot limited to breakdown of skin: Secondary | ICD-10-CM | POA: Diagnosis not present

## 2018-10-14 DIAGNOSIS — I7025 Atherosclerosis of native arteries of other extremities with ulceration: Secondary | ICD-10-CM

## 2018-10-14 MED ORDER — CADEXOMER IODINE 0.9 % EX GEL: 1.0000 "application " | Freq: Every day | CUTANEOUS | 2 refills | Status: DC | PRN

## 2018-10-14 NOTE — Telephone Encounter (Signed)
-----   Message from Kristian CoveyAshley E Prevette, [facility] sent at 10/14/2018 11:40 AM EST ----- Regarding: Vascular Vascular studies - wound right heel, non-palpable pulses with vascular consult

## 2018-10-14 NOTE — Progress Notes (Signed)
Subjective:  Patient ID: Jeremiah [name], male    DOB: 11/07/1946,  MRN: [id number] HPI Chief Complaint  Patient presents with  . Foot Ulcer    right foot bottom of heel; pt stated, "been there for about 5mo; have a nurse taking care of it; worried about infection"  . Nail Problem    pt stated, "need nails trimmed"    72 y.o. male presents with the above complaint.   ROS: Denies fever chills nausea vomiting muscle aches pains calf pain back pain chest pain shortness of breath.  Past Medical History:  Diagnosis Date  . Arthritis    "joints; shoulders, knees, hands, back" (05/21/2018)  . C. difficile diarrhea 04/2018  . Diastolic CHF (HCC)   . High cholesterol   . History of gout   . Hypertension   . Peripheral vascular disease (HCC)   . Pneumonia    "couple times" (05/21/2018)  . Sleep apnea    "has mask; won't use" (05/21/2018)  . Type II diabetes mellitus (HCC)    Past Surgical History:  Procedure Laterality Date  . CATARACT EXTRACTION W/ INTRAOCULAR LENS  IMPLANT, BILATERAL Bilateral   . CHOLECYSTECTOMY  05/21/2018   ATTEMPTED LAPAROSCOPIC CHOLECYSTECTOMY, OPEN DRAINAGE OF GALLBLADDER WITH BIOPSY  . CHOLECYSTECTOMY N/A 05/21/2018   Procedure: ATTEMPTED LAPAROSCOPIC CHOLECYSTECTOMY, OPEN DRAINAGE OF GALLBLADDER WITH BIOPSY;  Surgeon: Griselda Mineroth, Paul III, MD;  Location: [facility] OR;  Service: General;  Laterality: N/A;  . COLONOSCOPY W/ POLYPECTOMY    . COLONOSCOPY WITH PROPOFOL N/A 09/16/2018   Procedure: COLONOSCOPY WITH PROPOFOL;  Surgeon: Charlott RakesSchooler, Vincent, MD;  Location: [facility];  Service: Endoscopy;  Laterality: N/A;  . 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 IR CATHETER TUBE CHANGE  04/14/2018  . IR CHOLANGIOGRAM EXISTING TUBE  03/17/2018  . IR PERC CHOLECYSTOSTOMY  01/31/2018  . IR RADIOLOGIST EVAL & MGMT  03/02/2018    Current Outpatient Medications:    .  acetaminophen (TYLENOL) 500 MG tablet, Take 1,000 mg by mouth every 6 (six) hours as needed for moderate pain or headache., Disp: , Rfl:  .  aspirin 81 MG tablet, Take 81 mg by mouth daily., Disp: , Rfl:  .  brimonidine (ALPHAGAN) 0.2 % ophthalmic solution, Place 1 drop into both eyes 2 (two) times daily. , Disp: , Rfl:  .  glimepiride (AMARYL) 4 MG tablet, Take 1 tablet (4 mg total) by mouth daily with breakfast. (Patient taking differently: Take 4 mg by mouth 2 (two) times daily. ), Disp: 60 tablet, Rfl: 0 .  losartan (COZAAR) 100 MG tablet, Take 100 mg by mouth daily., Disp: , Rfl: 3 .  metoprolol succinate (TOPROL-XL) 50 MG 24 hr tablet, Take 1 tablet (50 mg total) by mouth daily. Take with or immediately following a meal., Disp: 30 tablet, Rfl: 3 .  Multiple Vitamin (MULTIVITAMIN WITH MINERALS) TABS tablet, Take 1 tablet by mouth daily., Disp: , Rfl:  .  simvastatin (ZOCOR) 40 MG tablet, Take 40 mg by mouth daily., Disp: , Rfl:  .  timolol (TIMOPTIC) 0.5 % ophthalmic solution, Place 1 drop into both eyes 2 (two) times daily., Disp: , Rfl: 6 .  cadexomer iodine (IODOSORB) 0.9 % gel, Apply 1 application topically daily as needed for wound care., Disp: 40 g, Rfl: 2  No Known Allergies Review of Systems Objective:  There were no vitals filed for this visit.  General: Well  developed, nourished, in no acute distress, alert and oriented x3   Dermatological: Skin is warm, dry and supple bilateral. Nails x 10 are well maintained; remaining integument appears unremarkable at this time. There are no open sores, no preulcerative lesions, no rash or signs of infection present.  Toenails are long thick yellow dystrophic and clinically mycotic.  He has an ulceration to the plantar lateral aspect of the right foot measures about 2-1/2 to 3 cm in diameter does not appear to be probing deep does feel painful there is no purulence no malodor there is a central eschar present.  Vascular: Dorsalis Pedis  artery and Posterior Tibial artery pedal pulses are delayed bilateral with delayed capillary fill time. Pedal hair growth present. No varicosities and no lower extremity edema present bilateral.   Neruologic: Grossly intact via light touch bilateral. Vibratory intact via tuning fork bilateral. Protective threshold with Semmes Wienstein monofilament intact to all pedal sites bilateral. Patellar and Achilles deep tendon reflexes 2+ bilateral. No Babinski or clonus noted bilateral.   Musculoskeletal: No gross boney pedal deformities bilateral. No pain, crepitus, or limitation noted with foot and ankle range of motion bilateral. Muscular strength 5/5 in all groups tested bilateral.  Gait: Unassisted, Nonantalgic.    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  Pain in limb secondary to onychomycosis.    Plan: Debridement of the wound today as well as debridement of the toenails.  Placed wound dressing with Iodosorb gel and placed in a Darco shoe and dispensed a heel pillow to use at nighttime.  He will follow-up with Dr. Logan Bores for wound care and then Dr. Donzetta Matters for toenails.     Jeremiah [name], [place]

## 2018-10-14 NOTE — Telephone Encounter (Signed)
Faxed orders to doppler to lab, and referral to the main office [facility].

## 2018-10-14 NOTE — Telephone Encounter (Signed)
[facility] called. Pt was seen today and prescribed a medication, when pt went to pick it up it was over $100 and he cannot afford it. Is there something else that can be prescribed?  They are also needing Re-certification for home health for pts right heel wound. Will Dr Al CorpusHyatt be willing to sign off on home health order? If not, they can reach out to PCP and see if they will sign off.  Please give her a call.  [phone]

## 2018-10-18 NOTE — Telephone Encounter (Signed)
Yes that will be fine and he needed to go to vascular as well.

## 2018-10-18 NOTE — Telephone Encounter (Signed)
Dr. Al CorpusHyatt states it would be okay to use betadine to the right heel daily and cover with a dry sterile dressing. Orders faxed to [facility].

## 2018-10-19 ENCOUNTER — Telehealth: Payer: Self-pay | Admitting: Podiatry

## 2018-10-19 NOTE — Telephone Encounter (Signed)
[facility] calling to clarify wound care orders for pt. They have received 2 different orders for wound care due to the pt stating they could not afford the original medication. Also, will Dr. be willing to sign home health orders? Please give her a call back

## 2018-10-19 NOTE — Telephone Encounter (Signed)
I spoke with Amy - [facility] and she states she is able to get Iodosorb if Dr.Hyatt would like. I told Amy, Dr. Al CorpusHyatt would prefer the Iodosorb daily.

## 2018-10-20 ENCOUNTER — Ambulatory Visit (HOSPITAL_COMMUNITY)
Admission: RE | Admit: 2018-10-20 | Discharge: 2018-10-20 | Disposition: A | Discharge status: Home / Self Care | Payer: Medicare Other | Source: Ambulatory Visit | Attending: Cardiology | Admitting: Cardiology

## 2018-10-20 DIAGNOSIS — E11621 Type 2 diabetes mellitus with foot ulcer: Secondary | ICD-10-CM

## 2018-10-20 DIAGNOSIS — I7025 Atherosclerosis of native arteries of other extremities with ulceration: Secondary | ICD-10-CM | POA: Insufficient documentation

## 2018-10-20 DIAGNOSIS — L97411 Non-pressure chronic ulcer of right heel and midfoot limited to breakdown of skin: Secondary | ICD-10-CM | POA: Insufficient documentation

## 2018-10-26 ENCOUNTER — Ambulatory Visit: Payer: Medicare Other | Admitting: Cardiovascular Disease

## 2018-10-26 VITALS — BP 182/80 | HR 82 | Ht 69.0 in | Wt 164.0 lb

## 2018-10-26 DIAGNOSIS — I739 Peripheral vascular disease, unspecified: Secondary | ICD-10-CM

## 2018-10-26 DIAGNOSIS — I5032 Chronic diastolic (congestive) heart failure: Secondary | ICD-10-CM | POA: Diagnosis not present

## 2018-10-26 DIAGNOSIS — E785 Hyperlipidemia, unspecified: Secondary | ICD-10-CM

## 2018-10-26 DIAGNOSIS — I10 Essential (primary) hypertension: Secondary | ICD-10-CM

## 2018-10-26 DIAGNOSIS — Z01812 Encounter for preprocedural laboratory examination: Secondary | ICD-10-CM | POA: Diagnosis not present

## 2018-10-26 NOTE — H&P (View-Only) (Signed)
  Cardiology Office Note   Date:  10/26/2018   ID:  Jeremiah [name], DOB 03/29/1946, MRN [id number]  PCP:  Harwani, Mohan, MD  Cardiologist: Dr. Harwani  Chief Complaint  Patient presents with  . New Patient (Initial Visit)    ulcer on left foot      History of Present Illness: Jeremiah [name] is a 72 y.o. male who was referred by Dr. Hyatt for evaluation of right foot ulcer and peripheral arterial disease. He has known history of chronic diastolic heart failure, prolonged diabetes mellitus, mild aortic stenosis hyperlipidemia, hypertension, sleep apnea and obesity.  He developed an ulceration on the lateral aspect of the right heel which started few months ago and has been gradually worsening.  It is now deep.  He does not recall any trauma.  He has significant discomfort and pain in the right foot. He underwent noninvasive vascular evaluation which showed an ABI of 0.59 on the right and 0.67 on the left.  Duplex showed occluded right SFA with heavy calcifications.  The left mid SFA was also occluded.  He was seen in the past in 2015 by Dr. Early for peripheral arterial disease but the patient was relatively asymptomatic at that time and thus was treated medically.  The patient has known history of chronic diastolic heart failure and mild aortic stenosis and is followed by Dr. Harwani.  He had an echocardiogram done in May which showed normal LV systolic function with mild aortic stenosis.  Past Medical History:  Diagnosis Date  . Arthritis    "joints; shoulders, knees, hands, back" (05/21/2018)  . C. difficile diarrhea 04/2018  . Diastolic CHF (HCC)   . High cholesterol   . History of gout   . Hypertension   . Peripheral vascular disease (HCC)   . Pneumonia    "couple times" (05/21/2018)  . Sleep apnea    "has mask; won't use" (05/21/2018)  . Type II diabetes mellitus (HCC)     Past Surgical History:  Procedure Laterality Date  . CATARACT EXTRACTION W/ INTRAOCULAR LENS   IMPLANT, BILATERAL Bilateral   . CHOLECYSTECTOMY  05/21/2018   ATTEMPTED LAPAROSCOPIC CHOLECYSTECTOMY, OPEN DRAINAGE OF GALLBLADDER WITH BIOPSY  . CHOLECYSTECTOMY N/A 05/21/2018   Procedure: ATTEMPTED LAPAROSCOPIC CHOLECYSTECTOMY, OPEN DRAINAGE OF GALLBLADDER WITH BIOPSY;  Surgeon: Toth, Paul III, MD;  Location: [facility] OR;  Service: General;  Laterality: N/A;  . COLONOSCOPY W/ POLYPECTOMY    . COLONOSCOPY WITH PROPOFOL N/A 09/16/2018   Procedure: COLONOSCOPY WITH PROPOFOL;  Surgeon: Schooler, Vincent, MD;  Location: [facility];  Service: Endoscopy;  Laterality: N/A;  . FECAL TRANSPLANT N/A 09/16/2018   Procedure: FECAL TRANSPLANT;  Surgeon: Schooler, Vincent, MD;  Location: [facility];  Service: Endoscopy;  Laterality: N/A;  . IR CATHETER TUBE CHANGE  04/14/2018  . IR CHOLANGIOGRAM EXISTING TUBE  03/17/2018  . IR PERC CHOLECYSTOSTOMY  01/31/2018  . IR RADIOLOGIST EVAL & MGMT  03/02/2018     Current Outpatient Medications  Medication Sig Dispense Refill  . acetaminophen (TYLENOL) 500 MG tablet Take 1,000 mg by mouth every 6 (six) hours as needed for moderate pain or headache.    . aspirin 81 MG tablet Take 81 mg by mouth daily.    . brimonidine (ALPHAGAN) 0.2 % ophthalmic solution Place 1 drop into both eyes 2 (two) times daily.     . cadexomer iodine (IODOSORB) 0.9 % gel Apply 1 application topically daily as needed for wound care. 40 g 2  . glimepiride (AMARYL)   4 MG tablet Take 4 mg by mouth 2 (two) times daily.    . losartan (COZAAR) 100 MG tablet Take 100 mg by mouth daily.  3  . metoprolol succinate (TOPROL-XL) 50 MG 24 hr tablet Take 1 tablet (50 mg total) by mouth daily. Take with or immediately following a meal. 30 tablet 3  . Multiple Vitamin (MULTIVITAMIN WITH MINERALS) TABS tablet Take 1 tablet by mouth daily.    . simvastatin (ZOCOR) 40 MG tablet Take 40 mg by mouth daily.    . timolol (TIMOPTIC) 0.5 % ophthalmic solution Place 1 drop into both eyes 2 (two) times daily.  6   No  current facility-administered medications for this visit.     Allergies:   Patient has no known allergies.    Social History:  The patient  reports that he quit smoking about 34 years ago. His smoking use included cigarettes. He quit after 24.00 years of use. He has never used smokeless tobacco. He reports that he drank alcohol. He reports that he does not use drugs.   Family History:  The patient's family history includes Diabetes in his brother, mother, and sister; Heart attack in his brother and father; Heart disease in his brother and father; Hypertension in his brother, mother, and sister.    ROS:  Please see the history of present illness.   Otherwise, review of systems are positive for none.   All other systems are reviewed and negative.    PHYSICAL EXAM: VS:  BP (!) 182/80   Pulse 82   Ht 5' 9" (1.753 m)   Wt 164 lb (74.4 kg)   BMI 24.22 kg/m  , BMI Body mass index is 24.22 kg/m. GEN: Well nourished, well developed, in no acute distress  HEENT: normal  Neck: no JVD, carotid bruits, or masses Cardiac: RRR; no rubs, or gallops,no edema .  2 out of 6 systolic murmur in the aortic area. Respiratory:  clear to auscultation bilaterally, normal work of breathing GI: soft, nontender, nondistended, + BS MS: no deformity or atrophy  Skin: warm and dry, no rash Neuro:  Strength and sensation are intact Psych: euthymic mood, full affect Vascular: Femoral pulses are normal.  Distal pulses are not palpable.  There is an almost 2 x 2 centimeter ulceration on the lateral aspect of the right heel which is few millimeters deep.   EKG:  EKG is not ordered today.   Recent Labs: 07/07/2018: B Natriuretic Peptide 499.2 07/08/2018: TSH 0.264 07/09/2018: Magnesium 1.9 07/20/2018: ALT 13; BUN 8; Creatinine, Ser 1.06; Hemoglobin 10.0; Platelets 370; Potassium 3.9; Sodium 141    Lipid Panel    Component Value Date/Time   CHOL  01/09/2009 1532    184        ATP III CLASSIFICATION:  <200      mg/dL   Desirable  200-239  mg/dL   Borderline High  >=240    mg/dL   High          TRIG 140 01/09/2009 1532   HDL 43 01/09/2009 1532   CHOLHDL 4.3 01/09/2009 1532   VLDL 28 01/09/2009 1532   LDLCALC (H) 01/09/2009 1532    113        Total Cholesterol/HDL:CHD Risk Coronary Heart Disease Risk Table                     Men   Women  1/2 Average Risk   3.4   3.3  Average Risk         5.0   4.4  2 X Average Risk   9.6   7.1  3 X Average Risk  23.4   11.0        Use the calculated Patient Ratio above and the CHD Risk Table to determine the patient's CHD Risk.        ATP III CLASSIFICATION (LDL):  <100     mg/dL   Optimal  100-129  mg/dL   Near or Above                    Optimal  130-159  mg/dL   Borderline  160-189  mg/dL   High  >190     mg/dL   Very High      Wt Readings from Last 3 Encounters:  10/26/18 164 lb (74.4 kg)  09/16/18 162 lb 11.2 oz (73.8 kg)  09/07/18 162 lb 12.8 oz (73.8 kg)       No flowsheet data found.    ASSESSMENT AND PLAN:  1.  Peripheral arterial disease with nonhealing ulceration on the right lateral heel which seems to be ischemic in nature due to severe peripheral arterial disease.  Vascular studies showed occluded right SFA which is heavily calcified.  Due to this, I recommend proceeding with abdominal aortogram with lower extremity runoff and possible endovascular intervention.  I discussed the procedure in details as well as risks and benefits. Given the heavy calcifications, the patient might require femoropopliteal bypass.  2.  Chronic diastolic heart failure: He appears to be euvolemic.  3.  Essential hypertension: Blood pressure is elevated today.  Continue to monitor and consider switching metoprolol to carvedilol.  4.  Hyperlipidemia: Currently on simvastatin 40 mg daily.  Recommend a target LDL of 70.    Disposition:   FU with me in 1 month  Signed,  Clara Cohick, MD  10/26/2018 11:41 AM    [facility] 

## 2018-10-26 NOTE — Progress Notes (Signed)
Cardiology Office Note   Date:  10/26/2018   ID:  Jeremiah [name], DOB 03/29/1946, MRN [id number]  PCP:  Rinaldo CloudHarwani, Mohan, MD  Cardiologist: Dr. Sharyn LullHarwani  Chief Complaint  Patient presents with  . New Patient (Initial Visit)    ulcer on left foot      History of Present Illness: Jeremiah [name] is a 72 y.o. male who was referred by Dr. Al CorpusHyatt for evaluation of right foot ulcer and peripheral arterial disease. He has known history of chronic diastolic heart failure, prolonged diabetes mellitus, mild aortic stenosis hyperlipidemia, hypertension, sleep apnea and obesity.  He developed an ulceration on the lateral aspect of the right heel which started few months ago and has been gradually worsening.  It is now deep.  He does not recall any trauma.  He has significant discomfort and pain in the right foot. He underwent noninvasive vascular evaluation which showed an ABI of 0.59 on the right and 0.67 on the left.  Duplex showed occluded right SFA with heavy calcifications.  The left mid SFA was also occluded.  He was seen in the past in 2015 by Dr. Arbie CookeyEarly for peripheral arterial disease but the patient was relatively asymptomatic at that time and thus was treated medically.  The patient has known history of chronic diastolic heart failure and mild aortic stenosis and is followed by Dr. Sharyn LullHarwani.  He had an echocardiogram done in May which showed normal LV systolic function with mild aortic stenosis.  Past Medical History:  Diagnosis Date  . Arthritis    "joints; shoulders, knees, hands, back" (05/21/2018)  . C. difficile diarrhea 04/2018  . Diastolic CHF (HCC)   . High cholesterol   . History of gout   . Hypertension   . Peripheral vascular disease (HCC)   . Pneumonia    "couple times" (05/21/2018)  . Sleep apnea    "has mask; won't use" (05/21/2018)  . Type II diabetes mellitus (HCC)     Past Surgical History:  Procedure Laterality Date  . CATARACT EXTRACTION W/ INTRAOCULAR LENS   IMPLANT, BILATERAL Bilateral   . CHOLECYSTECTOMY  05/21/2018   ATTEMPTED LAPAROSCOPIC CHOLECYSTECTOMY, OPEN DRAINAGE OF GALLBLADDER WITH BIOPSY  . CHOLECYSTECTOMY N/A 05/21/2018   Procedure: ATTEMPTED LAPAROSCOPIC CHOLECYSTECTOMY, OPEN DRAINAGE OF GALLBLADDER WITH BIOPSY;  Surgeon: Griselda Mineroth, Paul III, MD;  Location: [facility];  Service: General;  Laterality: N/A;  . COLONOSCOPY W/ POLYPECTOMY    . COLONOSCOPY WITH PROPOFOL N/A 09/16/2018   Procedure: COLONOSCOPY WITH PROPOFOL;  Surgeon: Charlott RakesSchooler, Vincent, MD;  Location: [facility];  Service: Endoscopy;  Laterality: N/A;  . 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 IR CATHETER TUBE CHANGE  04/14/2018  . IR CHOLANGIOGRAM EXISTING TUBE  03/17/2018  . IR PERC CHOLECYSTOSTOMY  01/31/2018  . IR RADIOLOGIST EVAL & MGMT  03/02/2018     Current Outpatient Medications  Medication Sig Dispense Refill  . acetaminophen (TYLENOL) 500 MG tablet Take 1,000 mg by mouth every 6 (six) hours as needed for moderate pain or headache.    Marland Kitchen. aspirin 81 MG tablet Take 81 mg by mouth daily.    . brimonidine (ALPHAGAN) 0.2 % ophthalmic solution Place 1 drop into both eyes 2 (two) times daily.     . cadexomer iodine (IODOSORB) 0.9 % gel Apply 1 application topically daily as needed for wound care. 40 g 2  . glimepiride (AMARYL)  4 MG tablet Take 4 mg by mouth 2 (two) times daily.    Marland Kitchen losartan (COZAAR) 100 MG tablet Take 100 mg by mouth daily.  3  . metoprolol succinate (TOPROL-XL) 50 MG 24 hr tablet Take 1 tablet (50 mg total) by mouth daily. Take with or immediately following a meal. 30 tablet 3  . Multiple Vitamin (MULTIVITAMIN WITH MINERALS) TABS tablet Take 1 tablet by mouth daily.    . simvastatin (ZOCOR) 40 MG tablet Take 40 mg by mouth daily.    . timolol (TIMOPTIC) 0.5 % ophthalmic solution Place 1 drop into both eyes 2 (two) times daily.  6   No  current facility-administered medications for this visit.     Allergies:   Patient has no known allergies.    Social History:  The patient  reports that he quit smoking about 34 years ago. His smoking use included cigarettes. He quit after 24.00 years of use. He has never used smokeless tobacco. He reports that he drank alcohol. He reports that he does not use drugs.   Family History:  The patient's family history includes Diabetes in his brother, mother, and sister; Heart attack in his brother and father; Heart disease in his brother and father; Hypertension in his brother, mother, and sister.    ROS:  Please see the history of present illness.   Otherwise, review of systems are positive for none.   All other systems are reviewed and negative.    PHYSICAL EXAM: VS:  BP (!) 182/80   Pulse 82   Ht 5\' 9"  (1.753 m)   Wt 164 lb (74.4 kg)   BMI 24.22 kg/m  , BMI Body mass index is 24.22 kg/m. GEN: Well nourished, well developed, in no acute distress  HEENT: normal  Neck: no JVD, carotid bruits, or masses Cardiac: RRR; no rubs, or gallops,no edema .  2 out of 6 systolic murmur in the aortic area. Respiratory:  clear to auscultation bilaterally, normal work of breathing GI: soft, nontender, nondistended, + BS MS: no deformity or atrophy  Skin: warm and dry, no rash Neuro:  Strength and sensation are intact Psych: euthymic mood, full affect Vascular: Femoral pulses are normal.  Distal pulses are not palpable.  There is an almost 2 x 2 centimeter ulceration on the lateral aspect of the right heel which is few millimeters deep.   EKG:  EKG is not ordered today.   Recent Labs: 07/07/2018: B Natriuretic Peptide 499.2 07/08/2018: TSH 0.264 07/09/2018: Magnesium 1.9 07/20/2018: ALT 13; BUN 8; Creatinine, Ser 1.06; Hemoglobin 10.0; Platelets 370; Potassium 3.9; Sodium 141    Lipid Panel    Component Value Date/Time   CHOL  01/09/2009 1532    184        ATP III CLASSIFICATION:  <200      mg/dL   Desirable  161-096  mg/dL   Borderline High  >=045    mg/dL   High          TRIG 409 01/09/2009 1532   HDL 43 01/09/2009 1532   CHOLHDL 4.3 01/09/2009 1532   VLDL 28 01/09/2009 1532   LDLCALC (H) 01/09/2009 1532    113        Total Cholesterol/HDL:CHD Risk Coronary Heart Disease Risk Table                     Men   Women  1/2 Average Risk   3.4   3.3  Average Risk  5.0   4.4  2 X Average Risk   9.6   7.1  3 X Average Risk  23.4   11.0        Use the calculated Patient Ratio above and the CHD Risk Table to determine the patient's CHD Risk.        ATP III CLASSIFICATION (LDL):  <100     mg/dL   Optimal  956-387  mg/dL   Near or Above                    Optimal  130-159  mg/dL   Borderline  564-332  mg/dL   High  >951     mg/dL   Very High      Wt Readings from Last 3 Encounters:  10/26/18 164 lb (74.4 kg)  09/16/18 162 lb 11.2 oz (73.8 kg)  09/07/18 162 lb 12.8 oz (73.8 kg)       No flowsheet data found.    ASSESSMENT AND PLAN:  1.  Peripheral arterial disease with nonhealing ulceration on the right lateral heel which seems to be ischemic in nature due to severe peripheral arterial disease.  Vascular studies showed occluded right SFA which is heavily calcified.  Due to this, I recommend proceeding with abdominal aortogram with lower extremity runoff and possible endovascular intervention.  I discussed the procedure in details as well as risks and benefits. Given the heavy calcifications, the patient might require femoropopliteal bypass.  2.  Chronic diastolic heart failure: He appears to be euvolemic.  3.  Essential hypertension: Blood pressure is elevated today.  Continue to monitor and consider switching metoprolol to carvedilol.  4.  Hyperlipidemia: Currently on simvastatin 40 mg daily.  Recommend a target LDL of 70.    Disposition:   FU with me in 1 month  Signed,  Lorine Bears, MD  10/26/2018 11:41 AM    [facility]

## 2018-10-26 NOTE — Patient Instructions (Signed)
Medication Instructions:  Your physician recommends that you continue on your current medications as directed. Please refer to the Current Medication list given to you today.  If you need a refill on your cardiac medications before your next appointment, please call your pharmacy.   Lab work: Your physician recommends that you return for lab work in: TODAY  If you have labs (blood work) drawn today and your tests are completely normal, you will receive your results only by: Marland Kitchen. MyChart Message (if you have MyChart) OR . A paper copy in the mail If you have any lab test that is abnormal or we need to change your treatment, we will call you to review the results.  Testing/Procedures: Dr. Kirke CorinArida has ordered an angiogram to be done at [facility].  This procedure is going to look at the bloodflow in your lower extremities.  If Dr. Kirke CorinArida is able to open up the arteries, you will have to spend one night in the hospital.  If he is not able to open the arteries, you will be able to go home that same day.    After the procedure, you will not be allowed to drive for 3 days or push, pull, or lift anything greater than 10 lbs for one week.      Follow-Up: At [facility], you and your health needs are our priority.  As part of our continuing mission to provide you with exceptional heart care, we have created designated Provider Care Teams.  These Care Teams include your primary Cardiologist (physician) and Advanced Practice Providers (APPs -  Physician Assistants and Nurse Practitioners) who all work together to provide you with the care you need, when you need it. You will need a follow up appointment in 3 - 4 weeks from 11/03/2018.  Please call our office 2 months in advance to schedule this appointment.  You may see Dr. Kirke CorinArida or one of the following Advanced Practice Providers on your designated Care Team:   Corine ShelterLuke Kilroy, PA-C Judy PimpleKrista Kroeger, [place] JerseyPA-C . Marjie Skiffallie Goodrich, PA-C  Any Other Special  Instructions Will Be Listed Below (If Applicable).

## 2018-10-27 LAB — BASIC METABOLIC PANEL
BUN/Creatinine Ratio: 12 (ref 10–24)
BUN: 15 mg/dL (ref 8–27)
CO2: 24 mmol/L (ref 20–29)
Calcium: 9.1 mg/dL (ref 8.6–10.2)
Chloride: 98 mmol/L (ref 96–106)
Creatinine, Ser: 1.3 mg/dL — ABNORMAL HIGH (ref 0.76–1.27)
GFR calc Af Amer: 62 mL/min/{1.73_m2} (ref >59)
GFR calc non Af Amer: 54 mL/min/{1.73_m2} — ABNORMAL LOW (ref >59)
Glucose: 186 mg/dL — ABNORMAL HIGH (ref 65–99)
Potassium: 4.1 mmol/L (ref 3.5–5.2)
Sodium: 138 mmol/L (ref 134–144)

## 2018-10-27 LAB — CBC WITH DIFFERENTIAL/PLATELET
Basophils Absolute: 0.1 10*3/uL (ref 0.0–0.2)
Basos: 1 %
EOS (ABSOLUTE): 1.8 10*3/uL — ABNORMAL HIGH (ref 0.0–0.4)
Eos: 14 %
Hematocrit: 27.2 % — ABNORMAL LOW (ref 37.5–51.0)
Hemoglobin: 8.9 g/dL — ABNORMAL LOW (ref 13.0–17.7)
Immature Grans (Abs): 0 10*3/uL (ref 0.0–0.1)
Immature Granulocytes: 0 %
Lymphocytes Absolute: 3.2 10*3/uL — ABNORMAL HIGH (ref 0.7–3.1)
Lymphs: 25 %
MCH: 26.3 pg — ABNORMAL LOW (ref 26.6–33.0)
MCHC: 32.7 g/dL (ref 31.5–35.7)
MCV: 80 fL (ref 79–97)
Monocytes Absolute: 0.9 10*3/uL (ref 0.1–0.9)
Monocytes: 7 %
Neutrophils Absolute: 7.1 10*3/uL — ABNORMAL HIGH (ref 1.4–7.0)
Neutrophils: 53 %
Platelets: 264 10*3/uL (ref 150–450)
RBC: 3.39 x10E6/uL — ABNORMAL LOW (ref 4.14–5.80)
RDW: 14.6 % (ref 12.3–15.4)
WBC: 13.1 10*3/uL — ABNORMAL HIGH (ref 3.4–10.8)

## 2018-10-29 ENCOUNTER — Telehealth: Payer: Self-pay | Admitting: *Deleted

## 2018-10-29 NOTE — Telephone Encounter (Signed)
Pt contacted pre-PV procedure scheduled [facility] for: Monday November 01, 2018 1:30 PM Verified arrival time and place: [facility] Main Entrance A at: 11:30 AM  No solid food after midnight prior to cath, clear liquids until 5 AM day of procedure.  Hold: Glimepiride -AM of procedure  Except hold medications AM meds can be  taken pre-cath with sip of water including: ASA 81 mg  Confirmed patient has responsible person to drive home post procedure and for 24 hours after you arrive home: yes  10/26/18- HGB 8.9  Dr Kirke CorinArida aware.

## 2018-11-01 ENCOUNTER — Ambulatory Visit (HOSPITAL_BASED_OUTPATIENT_CLINIC_OR_DEPARTMENT_OTHER)
Admission: RE | Admit: 2018-11-01 | Discharge: 2018-11-01 | Disposition: A | Discharge status: Home / Self Care | Payer: Medicare Other | Source: Ambulatory Visit | Attending: Vascular Surgery | Admitting: Vascular Surgery

## 2018-11-01 ENCOUNTER — Ambulatory Visit (HOSPITAL_COMMUNITY)
Admission: RE | Admit: 2018-11-01 | Discharge: 2018-11-01 | Disposition: A | Discharge status: Home / Self Care | Payer: Medicare Other | Source: Ambulatory Visit | Attending: Cardiovascular Disease | Admitting: Cardiovascular Disease

## 2018-11-01 ENCOUNTER — Encounter (HOSPITAL_COMMUNITY)
Admission: RE | Disposition: A | Discharge status: Home / Self Care | Payer: Self-pay | Source: Ambulatory Visit | Attending: Cardiovascular Disease

## 2018-11-01 ENCOUNTER — Other Ambulatory Visit: Payer: Self-pay

## 2018-11-01 DIAGNOSIS — Z9842 Cataract extraction status, left eye: Secondary | ICD-10-CM

## 2018-11-01 DIAGNOSIS — J90 Pleural effusion, not elsewhere classified: Secondary | ICD-10-CM | POA: Diagnosis present

## 2018-11-01 DIAGNOSIS — E1152 Type 2 diabetes mellitus with diabetic peripheral angiopathy with gangrene: Secondary | ICD-10-CM | POA: Diagnosis present

## 2018-11-01 DIAGNOSIS — E7800 Pure hypercholesterolemia, unspecified: Secondary | ICD-10-CM | POA: Insufficient documentation

## 2018-11-01 DIAGNOSIS — L97419 Non-pressure chronic ulcer of right heel and midfoot with unspecified severity: Secondary | ICD-10-CM

## 2018-11-01 DIAGNOSIS — Z8701 Personal history of pneumonia (recurrent): Secondary | ICD-10-CM

## 2018-11-01 DIAGNOSIS — Z9049 Acquired absence of other specified parts of digestive tract: Secondary | ICD-10-CM

## 2018-11-01 DIAGNOSIS — Z7982 Long term (current) use of aspirin: Secondary | ICD-10-CM | POA: Insufficient documentation

## 2018-11-01 DIAGNOSIS — I739 Peripheral vascular disease, unspecified: Secondary | ICD-10-CM

## 2018-11-01 DIAGNOSIS — M1990 Unspecified osteoarthritis, unspecified site: Secondary | ICD-10-CM

## 2018-11-01 DIAGNOSIS — Z79899 Other long term (current) drug therapy: Secondary | ICD-10-CM | POA: Insufficient documentation

## 2018-11-01 DIAGNOSIS — Z9119 Patient's noncompliance with other medical treatment and regimen: Secondary | ICD-10-CM

## 2018-11-01 DIAGNOSIS — S91301D Unspecified open wound, right foot, subsequent encounter: Secondary | ICD-10-CM | POA: Diagnosis not present

## 2018-11-01 DIAGNOSIS — E11621 Type 2 diabetes mellitus with foot ulcer: Secondary | ICD-10-CM

## 2018-11-01 DIAGNOSIS — Z961 Presence of intraocular lens: Secondary | ICD-10-CM | POA: Diagnosis present

## 2018-11-01 DIAGNOSIS — Z87891 Personal history of nicotine dependence: Secondary | ICD-10-CM | POA: Insufficient documentation

## 2018-11-01 DIAGNOSIS — I998 Other disorder of circulatory system: Secondary | ICD-10-CM

## 2018-11-01 DIAGNOSIS — M19042 Primary osteoarthritis, left hand: Secondary | ICD-10-CM | POA: Diagnosis present

## 2018-11-01 DIAGNOSIS — Z8249 Family history of ischemic heart disease and other diseases of the circulatory system: Secondary | ICD-10-CM

## 2018-11-01 DIAGNOSIS — R32 Unspecified urinary incontinence: Secondary | ICD-10-CM | POA: Diagnosis present

## 2018-11-01 DIAGNOSIS — Z7984 Long term (current) use of oral hypoglycemic drugs: Secondary | ICD-10-CM

## 2018-11-01 DIAGNOSIS — I70234 Atherosclerosis of native arteries of right leg with ulceration of heel and midfoot: Secondary | ICD-10-CM

## 2018-11-01 DIAGNOSIS — M19041 Primary osteoarthritis, right hand: Secondary | ICD-10-CM | POA: Diagnosis present

## 2018-11-01 DIAGNOSIS — N179 Acute kidney failure, unspecified: Secondary | ICD-10-CM | POA: Diagnosis present

## 2018-11-01 DIAGNOSIS — M19011 Primary osteoarthritis, right shoulder: Secondary | ICD-10-CM | POA: Diagnosis present

## 2018-11-01 DIAGNOSIS — E119 Type 2 diabetes mellitus without complications: Secondary | ICD-10-CM

## 2018-11-01 DIAGNOSIS — I779 Disorder of arteries and arterioles, unspecified: Secondary | ICD-10-CM | POA: Diagnosis present

## 2018-11-01 DIAGNOSIS — I70293 Other atherosclerosis of native arteries of extremities, bilateral legs: Secondary | ICD-10-CM

## 2018-11-01 DIAGNOSIS — E860 Dehydration: Secondary | ICD-10-CM | POA: Diagnosis present

## 2018-11-01 DIAGNOSIS — M170 Bilateral primary osteoarthritis of knee: Secondary | ICD-10-CM | POA: Diagnosis present

## 2018-11-01 DIAGNOSIS — I350 Nonrheumatic aortic (valve) stenosis: Secondary | ICD-10-CM | POA: Diagnosis present

## 2018-11-01 DIAGNOSIS — E785 Hyperlipidemia, unspecified: Secondary | ICD-10-CM | POA: Diagnosis present

## 2018-11-01 DIAGNOSIS — M4690 Unspecified inflammatory spondylopathy, site unspecified: Secondary | ICD-10-CM | POA: Diagnosis present

## 2018-11-01 DIAGNOSIS — M19012 Primary osteoarthritis, left shoulder: Secondary | ICD-10-CM | POA: Diagnosis present

## 2018-11-01 DIAGNOSIS — K08409 Partial loss of teeth, unspecified cause, unspecified class: Secondary | ICD-10-CM | POA: Diagnosis present

## 2018-11-01 DIAGNOSIS — I169 Hypertensive crisis, unspecified: Secondary | ICD-10-CM | POA: Diagnosis not present

## 2018-11-01 DIAGNOSIS — I110 Hypertensive heart disease with heart failure: Secondary | ICD-10-CM

## 2018-11-01 DIAGNOSIS — E1151 Type 2 diabetes mellitus with diabetic peripheral angiopathy without gangrene: Secondary | ICD-10-CM | POA: Insufficient documentation

## 2018-11-01 DIAGNOSIS — Z6824 Body mass index (BMI) 24.0-24.9, adult: Secondary | ICD-10-CM

## 2018-11-01 DIAGNOSIS — D62 Acute posthemorrhagic anemia: Secondary | ICD-10-CM | POA: Diagnosis not present

## 2018-11-01 DIAGNOSIS — Z77090 Contact with and (suspected) exposure to asbestos: Secondary | ICD-10-CM | POA: Diagnosis present

## 2018-11-01 DIAGNOSIS — E669 Obesity, unspecified: Secondary | ICD-10-CM | POA: Diagnosis present

## 2018-11-01 DIAGNOSIS — L97411 Non-pressure chronic ulcer of right heel and midfoot limited to breakdown of skin: Secondary | ICD-10-CM

## 2018-11-01 DIAGNOSIS — M109 Gout, unspecified: Secondary | ICD-10-CM | POA: Diagnosis present

## 2018-11-01 DIAGNOSIS — G4730 Sleep apnea, unspecified: Secondary | ICD-10-CM | POA: Insufficient documentation

## 2018-11-01 DIAGNOSIS — Z833 Family history of diabetes mellitus: Secondary | ICD-10-CM | POA: Insufficient documentation

## 2018-11-01 DIAGNOSIS — Z01810 Encounter for preprocedural cardiovascular examination: Secondary | ICD-10-CM | POA: Diagnosis not present

## 2018-11-01 DIAGNOSIS — G9340 Encephalopathy, unspecified: Secondary | ICD-10-CM | POA: Diagnosis present

## 2018-11-01 DIAGNOSIS — A4159 Other Gram-negative sepsis: Principal | ICD-10-CM | POA: Diagnosis present

## 2018-11-01 DIAGNOSIS — R911 Solitary pulmonary nodule: Secondary | ICD-10-CM | POA: Diagnosis present

## 2018-11-01 DIAGNOSIS — M869 Osteomyelitis, unspecified: Secondary | ICD-10-CM | POA: Diagnosis present

## 2018-11-01 DIAGNOSIS — E876 Hypokalemia: Secondary | ICD-10-CM | POA: Diagnosis present

## 2018-11-01 DIAGNOSIS — E1169 Type 2 diabetes mellitus with other specified complication: Secondary | ICD-10-CM | POA: Diagnosis present

## 2018-11-01 DIAGNOSIS — I5032 Chronic diastolic (congestive) heart failure: Secondary | ICD-10-CM

## 2018-11-01 DIAGNOSIS — Z9841 Cataract extraction status, right eye: Secondary | ICD-10-CM

## 2018-11-01 HISTORY — PX: ABDOMINAL AORTOGRAM W/LOWER EXTREMITY: CATH118223

## 2018-11-01 LAB — GLUCOSE, CAPILLARY
Glucose-Capillary: 121 mg/dL — ABNORMAL HIGH (ref 70–99)
Glucose-Capillary: 103 mg/dL — ABNORMAL HIGH (ref 70–99)

## 2018-11-01 SURGERY — ABDOMINAL AORTOGRAM W/LOWER EXTREMITY
Anesthesia: LOCAL

## 2018-11-01 MED ORDER — SODIUM CHLORIDE 0.9 % IV SOLN: 250.0000 mL | INTRAVENOUS | Status: DC | PRN

## 2018-11-01 MED ORDER — SODIUM CHLORIDE 0.9% FLUSH: 3.0000 mL | INTRAVENOUS | Status: DC | PRN

## 2018-11-01 MED ORDER — HYDRALAZINE HCL 20 MG/ML IJ SOLN: 5.0000 mg | INTRAMUSCULAR | Status: DC | PRN

## 2018-11-01 MED ORDER — ONDANSETRON HCL 4 MG/2ML IJ SOLN: 4.0000 mg | Freq: Four times a day (QID) | INTRAMUSCULAR | Status: DC | PRN

## 2018-11-01 MED ORDER — LIDOCAINE HCL (PF) 1 % IJ SOLN
INTRAMUSCULAR | Status: AC
  Filled 2018-11-01: qty 30

## 2018-11-01 MED ORDER — MIDAZOLAM HCL 2 MG/2ML IJ SOLN
INTRAMUSCULAR | Status: DC | PRN
  Administered 2018-11-01: 1 mg via INTRAVENOUS

## 2018-11-01 MED ORDER — HYDRALAZINE HCL 20 MG/ML IJ SOLN
INTRAMUSCULAR | Status: AC
  Filled 2018-11-01: qty 1

## 2018-11-01 MED ORDER — ASPIRIN 81 MG PO CHEW: 81.0000 mg | CHEWABLE_TABLET | ORAL | Status: DC

## 2018-11-01 MED ORDER — HEPARIN (PORCINE) IN NACL 1000-0.9 UT/500ML-% IV SOLN
INTRAVENOUS | Status: DC | PRN
  Administered 2018-11-01 (×2): 500 mL

## 2018-11-01 MED ORDER — FENTANYL CITRATE (PF) 100 MCG/2ML IJ SOLN
INTRAMUSCULAR | Status: DC | PRN
  Administered 2018-11-01: 25 ug via INTRAVENOUS

## 2018-11-01 MED ORDER — LIDOCAINE HCL (PF) 1 % IJ SOLN
INTRAMUSCULAR | Status: DC | PRN
  Administered 2018-11-01: 30 mL via INTRADERMAL

## 2018-11-01 MED ORDER — MIDAZOLAM HCL 2 MG/2ML IJ SOLN
INTRAMUSCULAR | Status: AC
  Filled 2018-11-01: qty 2

## 2018-11-01 MED ORDER — SODIUM CHLORIDE 0.9% FLUSH: 3.0000 mL | Freq: Two times a day (BID) | INTRAVENOUS | Status: DC

## 2018-11-01 MED ORDER — IODIXANOL 320 MG/ML IV SOLN
INTRAVENOUS | Status: DC | PRN
  Administered 2018-11-01: 110 mL via INTRAVENOUS

## 2018-11-01 MED ORDER — HYDRALAZINE HCL 20 MG/ML IJ SOLN
INTRAMUSCULAR | Status: DC | PRN
  Administered 2018-11-01 (×2): 10 mg via INTRAVENOUS

## 2018-11-01 MED ORDER — LABETALOL HCL 5 MG/ML IV SOLN: 10.0000 mg | INTRAVENOUS | Status: DC | PRN

## 2018-11-01 MED ORDER — MORPHINE SULFATE (PF) 2 MG/ML IV SOLN
INTRAVENOUS | Status: AC
  Administered 2018-11-01: 2 mg via INTRAVENOUS
  Filled 2018-11-01: qty 1

## 2018-11-01 MED ORDER — SODIUM CHLORIDE 0.9 % IV SOLN: INTRAVENOUS | Status: DC

## 2018-11-01 MED ORDER — ACETAMINOPHEN 325 MG PO TABS: 650.0000 mg | ORAL_TABLET | ORAL | Status: DC | PRN

## 2018-11-01 MED ORDER — FENTANYL CITRATE (PF) 100 MCG/2ML IJ SOLN
INTRAMUSCULAR | Status: AC
  Filled 2018-11-01: qty 2

## 2018-11-01 MED ORDER — HEPARIN (PORCINE) IN NACL 1000-0.9 UT/500ML-% IV SOLN
INTRAVENOUS | Status: AC
  Filled 2018-11-01: qty 1000

## 2018-11-01 MED ORDER — SODIUM CHLORIDE 0.9 % IV SOLN
INTRAVENOUS | Status: DC
  Administered 2018-11-01: 12:00:00 via INTRAVENOUS

## 2018-11-01 MED ORDER — MORPHINE SULFATE (PF) 10 MG/ML IV SOLN: 2.0000 mg | INTRAVENOUS | Status: DC | PRN

## 2018-11-01 SURGICAL SUPPLY — 11 items
CATH ANGIO 5F PIGTAIL 65CM (CATHETERS) ×1 IMPLANT
CLOSURE MYNX CONTROL 5F (Vascular Products) ×2 IMPLANT
KIT PV (KITS) ×2 IMPLANT
SHEATH PINNACLE 5F 10CM (SHEATH) ×1 IMPLANT
SHEATH PROBE COVER 6X72 (BAG) ×1 IMPLANT
STOPCOCK MORSE 400PSI 3WAY (MISCELLANEOUS) ×1 IMPLANT
SYR MEDRAD MARK 7 150ML (SYRINGE) ×2 IMPLANT
TRANSDUCER W/STOPCOCK (MISCELLANEOUS) ×2 IMPLANT
TRAY PV CATH (CUSTOM PROCEDURE TRAY) ×2 IMPLANT
TUBING CIL FLEX 10 FLL-RA (TUBING) ×1 IMPLANT
WIRE HITORQ VERSACORE ST 145CM (WIRE) ×1 IMPLANT

## 2018-11-01 NOTE — Discharge Instructions (Signed)

## 2018-11-01 NOTE — Progress Notes (Signed)
Lower extremity saphenous mapping completed.   Preliminary results in CV Proc.    Blanch MediaMegan Aadil Sur 11/01/2018 5:12 PM

## 2018-11-01 NOTE — Interval H&P Note (Signed)
History and Physical Interval Note:  11/01/2018 2:43 PM  Jeremiah [name]  has presented today for surgery, with the diagnosis of pad  The various methods of treatment have been discussed with the patient and family. After consideration of risks, benefits and other options for treatment, the patient has consented to  Procedure(s): ABDOMINAL AORTOGRAM W/LOWER EXTREMITY (N/A) as a surgical intervention .  The patient's history has been reviewed, patient examined, no change in status, stable for surgery.  I have reviewed the patient's chart and labs.  Questions were answered to the patient's satisfaction.     Nanetta BattyJonathan Anastasia Tompson

## 2018-11-01 NOTE — Consult Note (Signed)
Hospital Consult    Reason for Consult:  Right heel ulcer Referring Physician:  Dr. Allyson Sabal MRN #:  [id number]  History of Present Illness: This is a 72 y.o. male with right foot ulceration.  He has history of chronic diastolic heart failure, diabetes, hyperlipidemia, hypertension and is a former smoker.  Ulceration started a few months ago is gradually been worsening.  Does not know of any trauma.  Does not have any ulcers on the left side.  Has known SFA occlusion by duplex.  Aortogram demonstrated occluded SFA for long segment with reconstitution of popliteal artery at the knee but then TP trunk disease as well.  Past Medical History:  Diagnosis Date  . Arthritis    "joints; shoulders, knees, hands, back" (05/21/2018)  . C. difficile diarrhea 04/2018  . Diastolic CHF (HCC)   . High cholesterol   . History of gout   . Hypertension   . Peripheral vascular disease (HCC)   . Pneumonia    "couple times" (05/21/2018)  . Sleep apnea    "has mask; won't use" (05/21/2018)  . Type II diabetes mellitus (HCC)     Past Surgical History:  Procedure Laterality Date  . CATARACT EXTRACTION W/ INTRAOCULAR LENS  IMPLANT, BILATERAL Bilateral   . CHOLECYSTECTOMY  05/21/2018   ATTEMPTED LAPAROSCOPIC CHOLECYSTECTOMY, OPEN DRAINAGE OF GALLBLADDER WITH BIOPSY  . CHOLECYSTECTOMY N/A 05/21/2018   Procedure: ATTEMPTED LAPAROSCOPIC CHOLECYSTECTOMY, OPEN DRAINAGE OF GALLBLADDER WITH BIOPSY;  Surgeon: Griselda Miner, MD;  Location: [facility] OR;  Service: General;  Laterality: N/A;  . COLONOSCOPY W/ POLYPECTOMY    . COLONOSCOPY WITH PROPOFOL N/A 09/16/2018   Procedure: COLONOSCOPY WITH PROPOFOL;  Surgeon: Charlott Rakes, MD;  Location: [facility];  Service: Endoscopy;  Laterality: N/A;  . 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 IR CATHETER TUBE CHANGE  04/14/2018  . IR CHOLANGIOGRAM EXISTING  TUBE  03/17/2018  . IR PERC CHOLECYSTOSTOMY  01/31/2018  . IR RADIOLOGIST EVAL & MGMT  03/02/2018    No Known Allergies  Prior to Admission medications   Medication Sig Start Date End Date Taking? Authorizing Provider  aspirin 81 MG tablet Take 81 mg by mouth daily.   Yes [provider]  brimonidine (ALPHAGAN) 0.2 % ophthalmic solution Place 1 drop into both eyes 2 (two) times daily.    Yes [provider]  glimepiride (AMARYL) 4 MG tablet Take 4 mg by mouth 2 (two) times daily.   Yes [provider]  losartan (COZAAR) 100 MG tablet Take 100 mg by mouth daily. 03/01/18  Yes [provider]  metoprolol succinate (TOPROL-XL) 50 MG 24 hr tablet Take 1 tablet (50 mg total) by mouth daily. Take with or immediately following a meal. 04/02/18  Yes Rinaldo Cloud, MD  Multiple Vitamin (MULTIVITAMIN WITH MINERALS) TABS tablet Take 1 tablet by mouth daily.   Yes [provider]  simvastatin (ZOCOR) 40 MG tablet Take 40 mg by mouth daily.   Yes [provider]  timolol (TIMOPTIC) 0.5 % ophthalmic solution Place 1 drop into both eyes 2 (two) times daily. 01/27/18  Yes [provider]  acetaminophen (TYLENOL) 500 MG tablet Take 1,000 mg by mouth every 6 (six) hours as needed for moderate pain or headache.    [provider]  cadexomer iodine (IODOSORB) 0.9 % gel Apply 1 application topically daily as needed for wound care. 10/14/18  Hyatt, Max T, DPM    Social History   Socioeconomic History  . Marital status: Married    Spouse name: Not on file  . Number of children: Not on file  . Years of education: Not on file  . Highest education level: Not on file  Occupational History  . Not on file  Social Needs  . Financial resource strain: Not on file  . Food insecurity:    Worry: Not on file    Inability: Not on file  . Transportation needs:    Medical: Not on file    Non-medical: Not on file  Tobacco Use  . Smoking status: Former  Smoker    Years: 24.00    Types: Cigarettes    Last attempt to quit: 11/25/1983    Years since quitting: 34.9  . Smokeless tobacco: Never Used  Substance and Sexual Activity  . Alcohol use: Not Currently  . Drug use: No  . Sexual activity: Not on file  Lifestyle  . Physical activity:    Days per week: Not on file    Minutes per session: Not on file  . Stress: Not on file  Relationships  . Social connections:    Talks on phone: Not on file    Gets together: Not on file    Attends religious service: Not on file    Active member of club or organization: Not on file    Attends meetings of clubs or organizations: Not on file    Relationship status: Not on file  . Intimate partner violence:    Fear of current or ex partner: No    Emotionally abused: No    Physically abused: No    Forced sexual activity: No  Other Topics Concern  . Not on file  Social History Narrative  . Not on file    Family History  Problem Relation Age of Onset  . Diabetes Mother   . Hypertension Mother   . Heart disease Father   . Heart attack Father   . Diabetes Sister   . Hypertension Sister   . Diabetes Brother   . Heart disease Brother   . Hypertension Brother   . Heart attack Brother     ROS:  Cardiovascular: []  chest pain/pressure []  palpitations []  SOB lying flat []  DOE []  pain in legs while walking [x]  pain in legs at rest []  pain in legs at night [x]  non-healing ulcers []  hx of DVT []  swelling in legs  Pulmonary: []  productive cough []  asthma/wheezing []  home O2  Neurologic: []  weakness in []  arms []  legs []  numbness in []  arms []  legs []  hx of CVA []  mini stroke [] difficulty speaking or slurred speech []  temporary loss of vision in one eye []  dizziness  Hematologic: []  hx of cancer []  bleeding problems []  problems with blood clotting easily  Endocrine:   []  diabetes []  thyroid disease  GI []  vomiting blood []  blood in stool  GU: []  CKD/renal failure []   HD--[]  M/W/F or []  T/T/S []  burning with urination []  blood in urine  Psychiatric: []  anxiety []  depression  Musculoskeletal: []  arthritis []  joint pain  Integumentary: []  rashes [x]  ulcers  Constitutional: []  fever []  chills   Physical Examination  Vitals:   11/01/18 1528 11/01/18 1529  BP: (!) 188/86 (!) 188/85  Pulse: 81 86  Resp: 19 (!) 35  Temp:    SpO2: 100% 98%   Body mass index is 23.48 kg/m.  General:  WDWN in  NAD Gait: Not observed  HENT: WNL, normocephalic Pulmonary: normal non-labored breathing Cardiac: Palpable bilateral common femoral pulses Abdomen: soft, NT/ND, no masses Extremities: There is what appears to be ischemic ulceration on the right lateral aspect of his heel with minimal drainage Musculoskeletal: no muscle wasting or atrophy  Neurologic: A&O X 3; Appropriate Affect ; SENSATION: normal; MOTOR FUNCTION:  moving all extremities equally. Speech is fluent/normal   CBC    Component Value Date/Time   WBC 13.1 (H) 10/26/2018 1215   WBC 17.0 (H) 07/20/2018 1412   RBC 3.39 (L) 10/26/2018 1215   RBC 3.71 (L) 07/20/2018 1412   HGB 8.9 (L) 10/26/2018 1215   HCT 27.2 (L) 10/26/2018 1215   PLT 264 10/26/2018 1215   MCV 80 10/26/2018 1215   MCH 26.3 (L) 10/26/2018 1215   MCH 27.0 07/20/2018 1412   MCHC 32.7 10/26/2018 1215   MCHC 32.7 07/20/2018 1412   RDW 14.6 10/26/2018 1215   LYMPHSABS 3.2 (H) 10/26/2018 1215   MONOABS 1.3 (H) 07/20/2018 1412   EOSABS 1.8 (H) 10/26/2018 1215   BASOSABS 0.1 10/26/2018 1215    BMET    Component Value Date/Time   NA 138 10/26/2018 1215   K 4.1 10/26/2018 1215   CL 98 10/26/2018 1215   CO2 24 10/26/2018 1215   GLUCOSE 186 (H) 10/26/2018 1215   GLUCOSE 80 07/20/2018 1412   BUN 15 10/26/2018 1215   CREATININE 1.30 (H) 10/26/2018 1215   CALCIUM 9.1 10/26/2018 1215   GFRNONAA 54 (L) 10/26/2018 1215   GFRAA 62 10/26/2018 1215    COAGS: Lab Results  Component Value Date   INR 1.01 01/31/2018     ABIs reviewed 0.59 right 0.67 left  ASSESSMENT/PLAN: This is a 72 y.o. male with ischemic ulceration right lateral heel.  I have discussed with him he is at high risk for amputation in the future with or without bypass surgery.  We will get vein mapping performed today prior to discharge set him up for femoral to likely TP trunk bypass next week in the operating room.  I discussed with him the risk benefits alternatives in the presence of his family and they demonstrate good understanding.   Brandon C. [name], MD [facility] Office: [phone] Pager: [phone]

## 2018-11-02 ENCOUNTER — Telehealth: Payer: Self-pay

## 2018-11-02 ENCOUNTER — Other Ambulatory Visit: Payer: Self-pay

## 2018-11-02 ENCOUNTER — Encounter (HOSPITAL_COMMUNITY): Payer: Self-pay | Admitting: Cardiovascular Disease

## 2018-11-02 NOTE — Telephone Encounter (Signed)
Attempted to reach pt several times today to notify him of surgery date/time and instructions. Left voice mail this morning. Tried calling again and voice mail is now full.

## 2018-11-03 ENCOUNTER — Telehealth: Payer: Self-pay | Admitting: *Deleted

## 2018-11-03 ENCOUNTER — Encounter (HOSPITAL_COMMUNITY): Payer: Self-pay | Admitting: Emergency Medicine

## 2018-11-03 ENCOUNTER — Other Ambulatory Visit: Payer: Self-pay

## 2018-11-03 ENCOUNTER — Emergency Department (HOSPITAL_COMMUNITY): Payer: Medicare Other

## 2018-11-03 ENCOUNTER — Ambulatory Visit: Payer: Medicare Other | Admitting: Podiatry

## 2018-11-03 ENCOUNTER — Inpatient Hospital Stay (HOSPITAL_COMMUNITY): Payer: Medicare Other

## 2018-11-03 ENCOUNTER — Other Ambulatory Visit (HOSPITAL_COMMUNITY): Payer: Self-pay

## 2018-11-03 ENCOUNTER — Inpatient Hospital Stay (HOSPITAL_COMMUNITY)
Admission: EM | Admit: 2018-11-03 | Discharge: 2018-11-15 | DRG: 854 | Disposition: A | Discharge status: Rehabilitation Facility | Payer: Medicare Other | Attending: Internal Medicine | Admitting: Internal Medicine

## 2018-11-03 DIAGNOSIS — E11649 Type 2 diabetes mellitus with hypoglycemia without coma: Secondary | ICD-10-CM | POA: Diagnosis not present

## 2018-11-03 DIAGNOSIS — N179 Acute kidney failure, unspecified: Secondary | ICD-10-CM | POA: Diagnosis not present

## 2018-11-03 DIAGNOSIS — E46 Unspecified protein-calorie malnutrition: Secondary | ICD-10-CM | POA: Diagnosis not present

## 2018-11-03 DIAGNOSIS — I779 Disorder of arteries and arterioles, unspecified: Secondary | ICD-10-CM | POA: Diagnosis not present

## 2018-11-03 DIAGNOSIS — E8770 Fluid overload, unspecified: Secondary | ICD-10-CM | POA: Diagnosis not present

## 2018-11-03 DIAGNOSIS — G4730 Sleep apnea, unspecified: Secondary | ICD-10-CM | POA: Diagnosis present

## 2018-11-03 DIAGNOSIS — E785 Hyperlipidemia, unspecified: Secondary | ICD-10-CM | POA: Diagnosis present

## 2018-11-03 DIAGNOSIS — I169 Hypertensive crisis, unspecified: Secondary | ICD-10-CM

## 2018-11-03 DIAGNOSIS — S78111A Complete traumatic amputation at level between right hip and knee, initial encounter: Secondary | ICD-10-CM

## 2018-11-03 DIAGNOSIS — Z89611 Acquired absence of right leg above knee: Secondary | ICD-10-CM | POA: Diagnosis not present

## 2018-11-03 DIAGNOSIS — G9340 Encephalopathy, unspecified: Secondary | ICD-10-CM | POA: Diagnosis not present

## 2018-11-03 DIAGNOSIS — R7881 Bacteremia: Secondary | ICD-10-CM | POA: Diagnosis not present

## 2018-11-03 DIAGNOSIS — I5032 Chronic diastolic (congestive) heart failure: Secondary | ICD-10-CM | POA: Diagnosis not present

## 2018-11-03 DIAGNOSIS — E871 Hypo-osmolality and hyponatremia: Secondary | ICD-10-CM | POA: Diagnosis not present

## 2018-11-03 DIAGNOSIS — A419 Sepsis, unspecified organism: Secondary | ICD-10-CM | POA: Diagnosis not present

## 2018-11-03 DIAGNOSIS — I96 Gangrene, not elsewhere classified: Secondary | ICD-10-CM | POA: Diagnosis not present

## 2018-11-03 DIAGNOSIS — E1169 Type 2 diabetes mellitus with other specified complication: Secondary | ICD-10-CM | POA: Diagnosis present

## 2018-11-03 DIAGNOSIS — R011 Cardiac murmur, unspecified: Secondary | ICD-10-CM | POA: Diagnosis not present

## 2018-11-03 DIAGNOSIS — E119 Type 2 diabetes mellitus without complications: Secondary | ICD-10-CM

## 2018-11-03 DIAGNOSIS — E11621 Type 2 diabetes mellitus with foot ulcer: Secondary | ICD-10-CM | POA: Diagnosis present

## 2018-11-03 DIAGNOSIS — I10 Essential (primary) hypertension: Secondary | ICD-10-CM | POA: Diagnosis not present

## 2018-11-03 DIAGNOSIS — M19011 Primary osteoarthritis, right shoulder: Secondary | ICD-10-CM | POA: Diagnosis present

## 2018-11-03 DIAGNOSIS — E1142 Type 2 diabetes mellitus with diabetic polyneuropathy: Secondary | ICD-10-CM | POA: Diagnosis present

## 2018-11-03 DIAGNOSIS — E1151 Type 2 diabetes mellitus with diabetic peripheral angiopathy without gangrene: Secondary | ICD-10-CM | POA: Diagnosis present

## 2018-11-03 DIAGNOSIS — Z8719 Personal history of other diseases of the digestive system: Secondary | ICD-10-CM | POA: Diagnosis not present

## 2018-11-03 DIAGNOSIS — I70234 Atherosclerosis of native arteries of right leg with ulceration of heel and midfoot: Secondary | ICD-10-CM | POA: Diagnosis not present

## 2018-11-03 DIAGNOSIS — D62 Acute posthemorrhagic anemia: Secondary | ICD-10-CM

## 2018-11-03 DIAGNOSIS — E860 Dehydration: Secondary | ICD-10-CM | POA: Diagnosis present

## 2018-11-03 DIAGNOSIS — Z9862 Peripheral vascular angioplasty status: Secondary | ICD-10-CM | POA: Diagnosis not present

## 2018-11-03 DIAGNOSIS — J920 Pleural plaque with presence of asbestos: Secondary | ICD-10-CM | POA: Diagnosis not present

## 2018-11-03 DIAGNOSIS — R911 Solitary pulmonary nodule: Secondary | ICD-10-CM | POA: Diagnosis present

## 2018-11-03 DIAGNOSIS — B9689 Other specified bacterial agents as the cause of diseases classified elsewhere: Secondary | ICD-10-CM | POA: Diagnosis not present

## 2018-11-03 DIAGNOSIS — G8918 Other acute postprocedural pain: Secondary | ICD-10-CM | POA: Diagnosis not present

## 2018-11-03 DIAGNOSIS — J90 Pleural effusion, not elsewhere classified: Secondary | ICD-10-CM | POA: Diagnosis not present

## 2018-11-03 DIAGNOSIS — M869 Osteomyelitis, unspecified: Secondary | ICD-10-CM | POA: Diagnosis not present

## 2018-11-03 DIAGNOSIS — E8809 Other disorders of plasma-protein metabolism, not elsewhere classified: Secondary | ICD-10-CM | POA: Diagnosis present

## 2018-11-03 DIAGNOSIS — L089 Local infection of the skin and subcutaneous tissue, unspecified: Secondary | ICD-10-CM | POA: Diagnosis not present

## 2018-11-03 DIAGNOSIS — A0472 Enterocolitis due to Clostridium difficile, not specified as recurrent: Secondary | ICD-10-CM | POA: Diagnosis not present

## 2018-11-03 DIAGNOSIS — R509 Fever, unspecified: Secondary | ICD-10-CM | POA: Diagnosis present

## 2018-11-03 DIAGNOSIS — A0471 Enterocolitis due to Clostridium difficile, recurrent: Secondary | ICD-10-CM | POA: Diagnosis not present

## 2018-11-03 DIAGNOSIS — M109 Gout, unspecified: Secondary | ICD-10-CM | POA: Diagnosis present

## 2018-11-03 DIAGNOSIS — Z7984 Long term (current) use of oral hypoglycemic drugs: Secondary | ICD-10-CM | POA: Diagnosis not present

## 2018-11-03 DIAGNOSIS — R197 Diarrhea, unspecified: Secondary | ICD-10-CM | POA: Diagnosis not present

## 2018-11-03 DIAGNOSIS — D72829 Elevated white blood cell count, unspecified: Secondary | ICD-10-CM | POA: Diagnosis not present

## 2018-11-03 DIAGNOSIS — B964 Proteus (mirabilis) (morganii) as the cause of diseases classified elsewhere: Secondary | ICD-10-CM | POA: Diagnosis not present

## 2018-11-03 DIAGNOSIS — E1152 Type 2 diabetes mellitus with diabetic peripheral angiopathy with gangrene: Secondary | ICD-10-CM | POA: Diagnosis not present

## 2018-11-03 DIAGNOSIS — I5030 Unspecified diastolic (congestive) heart failure: Secondary | ICD-10-CM | POA: Diagnosis not present

## 2018-11-03 DIAGNOSIS — A4159 Other Gram-negative sepsis: Secondary | ICD-10-CM | POA: Diagnosis not present

## 2018-11-03 DIAGNOSIS — Z89619 Acquired absence of unspecified leg above knee: Secondary | ICD-10-CM

## 2018-11-03 DIAGNOSIS — Z9049 Acquired absence of other specified parts of digestive tract: Secondary | ICD-10-CM | POA: Diagnosis not present

## 2018-11-03 DIAGNOSIS — I509 Heart failure, unspecified: Secondary | ICD-10-CM | POA: Diagnosis not present

## 2018-11-03 DIAGNOSIS — D649 Anemia, unspecified: Secondary | ICD-10-CM | POA: Diagnosis not present

## 2018-11-03 DIAGNOSIS — R0989 Other specified symptoms and signs involving the circulatory and respiratory systems: Secondary | ICD-10-CM | POA: Diagnosis not present

## 2018-11-03 DIAGNOSIS — Z8619 Personal history of other infectious and parasitic diseases: Secondary | ICD-10-CM | POA: Diagnosis not present

## 2018-11-03 DIAGNOSIS — S91301A Unspecified open wound, right foot, initial encounter: Secondary | ICD-10-CM | POA: Diagnosis present

## 2018-11-03 DIAGNOSIS — D509 Iron deficiency anemia, unspecified: Secondary | ICD-10-CM | POA: Diagnosis not present

## 2018-11-03 DIAGNOSIS — E875 Hyperkalemia: Secondary | ICD-10-CM | POA: Diagnosis not present

## 2018-11-03 DIAGNOSIS — Z4781 Encounter for orthopedic aftercare following surgical amputation: Secondary | ICD-10-CM | POA: Diagnosis present

## 2018-11-03 DIAGNOSIS — I350 Nonrheumatic aortic (valve) stenosis: Secondary | ICD-10-CM | POA: Diagnosis present

## 2018-11-03 DIAGNOSIS — R7309 Other abnormal glucose: Secondary | ICD-10-CM | POA: Diagnosis not present

## 2018-11-03 DIAGNOSIS — E876 Hypokalemia: Secondary | ICD-10-CM | POA: Diagnosis present

## 2018-11-03 DIAGNOSIS — Z79899 Other long term (current) drug therapy: Secondary | ICD-10-CM | POA: Diagnosis not present

## 2018-11-03 DIAGNOSIS — S91301D Unspecified open wound, right foot, subsequent encounter: Secondary | ICD-10-CM

## 2018-11-03 DIAGNOSIS — L8990 Pressure ulcer of unspecified site, unspecified stage: Secondary | ICD-10-CM | POA: Diagnosis present

## 2018-11-03 DIAGNOSIS — M170 Bilateral primary osteoarthritis of knee: Secondary | ICD-10-CM | POA: Diagnosis present

## 2018-11-03 DIAGNOSIS — I110 Hypertensive heart disease with heart failure: Secondary | ICD-10-CM | POA: Diagnosis not present

## 2018-11-03 DIAGNOSIS — M19012 Primary osteoarthritis, left shoulder: Secondary | ICD-10-CM | POA: Diagnosis present

## 2018-11-03 DIAGNOSIS — L97419 Non-pressure chronic ulcer of right heel and midfoot with unspecified severity: Secondary | ICD-10-CM | POA: Diagnosis not present

## 2018-11-03 DIAGNOSIS — R918 Other nonspecific abnormal finding of lung field: Secondary | ICD-10-CM | POA: Diagnosis not present

## 2018-11-03 LAB — CBC WITH DIFFERENTIAL/PLATELET
Abs Immature Granulocytes: 0.06 10*3/uL (ref 0.00–0.07)
Basophils Absolute: 0 10*3/uL (ref 0.0–0.1)
Basophils Relative: 0 %
Eosinophils Absolute: 0.1 10*3/uL (ref 0.0–0.5)
Eosinophils Relative: 1 %
HCT: 36.2 % — ABNORMAL LOW (ref 39.0–52.0)
Hemoglobin: 10.9 g/dL — ABNORMAL LOW (ref 13.0–17.0)
Immature Granulocytes: 0 %
Lymphocytes Relative: 7 %
Lymphs Abs: 1.2 10*3/uL (ref 0.7–4.0)
MCH: 25.3 pg — ABNORMAL LOW (ref 26.0–34.0)
MCHC: 30.1 g/dL (ref 30.0–36.0)
MCV: 84.2 fL (ref 80.0–100.0)
Monocytes Absolute: 1.6 10*3/uL — ABNORMAL HIGH (ref 0.1–1.0)
Monocytes Relative: 9 %
Neutro Abs: 13.6 10*3/uL — ABNORMAL HIGH (ref 1.7–7.7)
Neutrophils Relative %: 83 %
Platelets: 251 10*3/uL (ref 150–400)
RBC: 4.3 MIL/uL (ref 4.22–5.81)
RDW: 14.9 % (ref 11.5–15.5)
WBC: 16.5 10*3/uL — ABNORMAL HIGH (ref 4.0–10.5)
nRBC: 0 % (ref 0.0–0.2)

## 2018-11-03 LAB — I-STAT CG4 LACTIC ACID, ED: Lactic Acid, Venous: 1.77 mmol/L (ref 0.5–1.9)

## 2018-11-03 LAB — COMPREHENSIVE METABOLIC PANEL
ALT: 14 U/L (ref 0–44)
AST: 19 U/L (ref 15–41)
Albumin: 2.2 g/dL — ABNORMAL LOW (ref 3.5–5.0)
Alkaline Phosphatase: 93 U/L (ref 38–126)
Anion gap: 14 (ref 5–15)
BUN: 16 mg/dL (ref 8–23)
CO2: 22 mmol/L (ref 22–32)
Calcium: 8.6 mg/dL — ABNORMAL LOW (ref 8.9–10.3)
Chloride: 100 mmol/L (ref 98–111)
Creatinine, Ser: 1.36 mg/dL — ABNORMAL HIGH (ref 0.61–1.24)
GFR calc Af Amer: 59 mL/min — ABNORMAL LOW (ref >60)
GFR calc non Af Amer: 51 mL/min — ABNORMAL LOW (ref >60)
Glucose, Bld: 220 mg/dL — ABNORMAL HIGH (ref 70–99)
Potassium: 4.1 mmol/L (ref 3.5–5.1)
Sodium: 136 mmol/L (ref 135–145)
Total Bilirubin: 0.8 mg/dL (ref 0.3–1.2)
Total Protein: 7.4 g/dL (ref 6.5–8.1)

## 2018-11-03 LAB — URINALYSIS, ROUTINE W REFLEX MICROSCOPIC
Bacteria, UA: NONE SEEN
Bilirubin Urine: NEGATIVE
Glucose, UA: NEGATIVE mg/dL
Ketones, ur: NEGATIVE mg/dL
Leukocytes, UA: NEGATIVE
Nitrite: NEGATIVE
Protein, ur: 100 mg/dL — AB
Specific Gravity, Urine: 1.02 (ref 1.005–1.030)
pH: 5 (ref 5.0–8.0)

## 2018-11-03 LAB — CBG MONITORING, ED: Glucose-Capillary: 133 mg/dL — ABNORMAL HIGH (ref 70–99)

## 2018-11-03 MED ORDER — SODIUM CHLORIDE 0.9 % IV BOLUS (SEPSIS)
250.0000 mL | Freq: Once | INTRAVENOUS | Status: AC
  Administered 2018-11-03: 250 mL via INTRAVENOUS

## 2018-11-03 MED ORDER — METOPROLOL SUCCINATE ER 50 MG PO TB24
50.0000 mg | ORAL_TABLET | Freq: Every day | ORAL | Status: DC
  Administered 2018-11-04 – 2018-11-15 (×12): 50 mg via ORAL
  Filled 2018-11-03 (×12): qty 1

## 2018-11-03 MED ORDER — ASPIRIN 81 MG PO CHEW
81.0000 mg | CHEWABLE_TABLET | Freq: Every day | ORAL | Status: DC
  Administered 2018-11-04 – 2018-11-15 (×12): 81 mg via ORAL
  Filled 2018-11-03 (×12): qty 1

## 2018-11-03 MED ORDER — SODIUM CHLORIDE 0.9 % IV SOLN
2.0000 g | Freq: Once | INTRAVENOUS | Status: AC
  Administered 2018-11-03: 2 g via INTRAVENOUS
  Filled 2018-11-03: qty 2

## 2018-11-03 MED ORDER — HEPARIN SODIUM (PORCINE) 5000 UNIT/ML IJ SOLN
5000.0000 [IU] | Freq: Three times a day (TID) | INTRAMUSCULAR | Status: DC
  Administered 2018-11-04 – 2018-11-09 (×16): 5000 [IU] via SUBCUTANEOUS
  Filled 2018-11-03 (×16): qty 1

## 2018-11-03 MED ORDER — ACETAMINOPHEN 325 MG PO TABS
650.0000 mg | ORAL_TABLET | Freq: Four times a day (QID) | ORAL | Status: DC | PRN
  Administered 2018-11-04 – 2018-11-08 (×9): 650 mg via ORAL
  Filled 2018-11-03 (×9): qty 2

## 2018-11-03 MED ORDER — SODIUM CHLORIDE 0.9 % IV BOLUS (SEPSIS)
1000.0000 mL | Freq: Once | INTRAVENOUS | Status: AC
  Administered 2018-11-03: 1000 mL via INTRAVENOUS

## 2018-11-03 MED ORDER — ENOXAPARIN SODIUM 40 MG/0.4ML ~~LOC~~ SOLN: 40.0000 mg | SUBCUTANEOUS | Status: DC

## 2018-11-03 MED ORDER — ACETAMINOPHEN 500 MG PO TABS
1000.0000 mg | ORAL_TABLET | Freq: Once | ORAL | Status: AC
  Administered 2018-11-03: 1000 mg via ORAL
  Filled 2018-11-03: qty 2

## 2018-11-03 MED ORDER — SENNOSIDES-DOCUSATE SODIUM 8.6-50 MG PO TABS: 1.0000 | ORAL_TABLET | Freq: Every evening | ORAL | Status: DC | PRN

## 2018-11-03 MED ORDER — PROMETHAZINE HCL 25 MG PO TABS: 12.5000 mg | ORAL_TABLET | Freq: Four times a day (QID) | ORAL | Status: DC | PRN

## 2018-11-03 MED ORDER — INSULIN ASPART 100 UNIT/ML ~~LOC~~ SOLN
0.0000 [IU] | Freq: Three times a day (TID) | SUBCUTANEOUS | Status: DC
  Administered 2018-11-04 – 2018-11-06 (×5): 2 [IU] via SUBCUTANEOUS
  Administered 2018-11-06: 3 [IU] via SUBCUTANEOUS
  Administered 2018-11-07 (×3): 2 [IU] via SUBCUTANEOUS
  Administered 2018-11-08: 3 [IU] via SUBCUTANEOUS
  Administered 2018-11-08: 2 [IU] via SUBCUTANEOUS
  Administered 2018-11-09 (×2): 5 [IU] via SUBCUTANEOUS
  Administered 2018-11-10: 2 [IU] via SUBCUTANEOUS
  Administered 2018-11-10: 3 [IU] via SUBCUTANEOUS
  Administered 2018-11-10 – 2018-11-11 (×2): 2 [IU] via SUBCUTANEOUS
  Administered 2018-11-11: 5 [IU] via SUBCUTANEOUS
  Administered 2018-11-12 (×3): 2 [IU] via SUBCUTANEOUS
  Administered 2018-11-13 – 2018-11-14 (×4): 3 [IU] via SUBCUTANEOUS
  Administered 2018-11-15 (×2): 2 [IU] via SUBCUTANEOUS

## 2018-11-03 MED ORDER — LOSARTAN POTASSIUM 50 MG PO TABS
100.0000 mg | ORAL_TABLET | Freq: Every day | ORAL | Status: DC
  Administered 2018-11-04 – 2018-11-14 (×12): 100 mg via ORAL
  Filled 2018-11-03 (×14): qty 2

## 2018-11-03 MED ORDER — VANCOMYCIN HCL IN DEXTROSE 1-5 GM/200ML-% IV SOLN
1000.0000 mg | Freq: Once | INTRAVENOUS | Status: AC
  Administered 2018-11-03: 1000 mg via INTRAVENOUS
  Filled 2018-11-03: qty 200

## 2018-11-03 MED ORDER — SIMVASTATIN 20 MG PO TABS
40.0000 mg | ORAL_TABLET | Freq: Every day | ORAL | Status: DC
  Administered 2018-11-04 – 2018-11-15 (×12): 40 mg via ORAL
  Filled 2018-11-03 (×12): qty 2

## 2018-11-03 MED ORDER — METRONIDAZOLE IN NACL 5-0.79 MG/ML-% IV SOLN
500.0000 mg | Freq: Three times a day (TID) | INTRAVENOUS | Status: DC
  Administered 2018-11-03 – 2018-11-04 (×4): 500 mg via INTRAVENOUS
  Filled 2018-11-03 (×6): qty 100

## 2018-11-03 MED ORDER — ACETAMINOPHEN 650 MG RE SUPP: 650.0000 mg | Freq: Four times a day (QID) | RECTAL | Status: DC | PRN

## 2018-11-03 NOTE — Telephone Encounter (Signed)
Spoke with patient's son Jeremiah [name]. Instructed that surgery date is 11/09/18 and to be at admitting at 5:30 am. NPO past MN and expect a call and follow the detailed instructions received from the pre-admission department for this surgery. Son asks that we give his number to hospital as contact.

## 2018-11-03 NOTE — ED Notes (Signed)
MD Early at bedside

## 2018-11-03 NOTE — ED Notes (Signed)
X-ray at bedside

## 2018-11-03 NOTE — H&P (Signed)
Date: 11/03/2018               Patient Name:  Jeremiah [name] MRN: [id number]  DOB: Nov 18, 1946 Age / Sex: 72 y.o., male   PCP: Rinaldo CloudHarwani, Mohan, MD         Medical Service: Internal Medicine Teaching Service         Attending Physician: Dr. Inez CatalinaMullen, Emily B, MD    First Contact: Dr. Judeth CornfieldLee, Renata Gambino, MD Pager: [phone]  Second Contact: Dr. Lorenso Courierhundi, Vahini, MD Pager: [phone]       After Hours (After 5p/  First Contact Pager: [phone]  weekends / holidays): Second Contact Pager: [phone]   Chief Complaint: Right leg wound  History of Present Illness:  Jeremiah [name] is a 72 yo M w/ PMH of diastolic CHF, PVD, HTN, recurrent C.Diff infection, Gout, and T2DM presenting with right leg pain and altered mental status. He was examined and evaluated at bedside this PM with Jeremiah [name] present. He is pleasant and able to answer questions albeit slowly. AAO x2 to self and location. He does not recall Jeremiah initial presentation but does endorse right leg pain. Jeremiah [name] presents additional history in that she came back from dialysis this morning and found that he had urinary incontinence and appeared disoriented and 'not himself.' She was concerned as he has a non-healing right foot heel wound, developed during prior hospitalization for C.diff sepsis, that has been present with increased swelling, pain and reddish discharge for the last week or so and thought it could be infected especially considering that he has uncontrolled diabetes. She brought him to the ED for evaluation. He denies any F/N/V/D/C/ chest pain/ palpitations/ dyspnea/ cough/ dysuria/ frequency/ urgency   Of note, he had a visit with Jeremiah vascular surgeon, Dr.Cain, two days ago who spoke about possible intervention and where [facility] showed occluded left SFA as well as occluded L anterior tibial artery as well as occluded right SFA and occluded R anterior tibial artery with plans for right femoropopliteal bypass  grafting scheduled for 11/09/2018.   In the ED, he was found to have leukocytosis with WBC of 16.5, lactate 1.77, febrile at 102F w/ altered mental status. Blood cultures were collected for sepsis work up. Right foot X-ray showed no evidence of osteomyelitis and CT head showed no acute intracranial abnormalities. He was started on cef, vanc and flagyl  Meds: Aspirin 81mg  Brimonidine 0.2% drop solution Glimepiride 4mg  BID Losartan 100mg  daily Metoprolol succinate 50mg  daily Simvastatin 40mg  daily Timolol 0.5% drop BID  Allergies: Allergies as of 11/03/2018  . (No Known Allergies)   Past Medical History:  Diagnosis Date  . Arthritis    "joints; shoulders, knees, hands, back" (05/21/2018)  . C. difficile diarrhea 04/2018  . Diastolic CHF (HCC)   . High cholesterol   . History of gout   . Hypertension   . Peripheral vascular disease (HCC)   . Pneumonia    "couple times" (05/21/2018)  . Sleep apnea    "has mask; won't use" (05/21/2018)  . Type II diabetes mellitus (HCC)     Family History:  Multiple 1st degree relatives w/ type 2 diabetes Family History  Problem Relation Age of Onset  . Diabetes Mother   . Hypertension Mother   . Heart disease Father   . Heart attack Father   . Diabetes Sister   . Hypertension Sister   . Diabetes Brother   . Heart disease Brother   .  Hypertension Brother   . Heart attack Brother    Social History:  Used to work as at [employer]. Currently retired. Lives at home with [name]. Denies any alcohol, tobacco, illicit substance use. Social History   Tobacco Use  . Smoking status: Former Smoker    Years: 24.00    Types: Cigarettes    Last attempt to quit: 11/25/1983    Years since quitting: 34.9  . Smokeless tobacco: Never Used  Substance Use Topics  . Alcohol use: Not Currently  . Drug use: No    Review of Systems: A complete ROS was negative except as per HPI.   Physical Exam: Blood pressure (!) 158/56, pulse 83,  temperature 98.8 F (37.1 C), temperature source Oral, resp. rate (!) 21, SpO2 96 %.  Gen: Well-developed, well nourished, NAD HEENT: NCAT head, hearing intact, EOMI, PERRL, No nasal discharge, MMM Neck: supple, ROM intact, no JVD, no cervical adenopathy CV: RRR, S1, S2 normal, No rubs, no murmurs, no gallops Pulm: CTAB, No rales, no wheezes, no dullness to percussion  Abd: Soft, BS+, NTND, No rebound, no guarding Extm: ROM intact, Peripheral pulses intact Skin: Dry, Warm, normal turgor, ~1.5cm deep ulcer no right lateral heel with bloody foul smelling exudate with warmth.  Neuro: AAOx2, Cranial Nerve II-XII intact, DTR intact, answers questions slowly Psych: Normal mood and affect  EKG: personally reviewed my interpretation is sinus tachycardia, normal axis, no ST elevation or depression, no T wave inversions  CXR: personally reviewed my interpretation is slightly rotated, poor inspiration, + right pleural effusion, no lobar consolidation, no vascular congestion.  Assessment & Plan by Problem:  Jeremiah [name] is a 72 yo M w/ PMH of diastolic CHF, PVD, HTN, recurrent C.Diff infection, Gout, and T2DM presenting with infected foot ulcer. He has severe peripheral vascular disease confirmed with arteriogram which he was planned for bypass surgery next week. He will require surgery with at least debridement of Jeremiah infected ulcer for source control with possible bypass surgery. Meanwhile, we will treat with empiric abx and follow up blood cultures for possible bacteremia.    Fever, tachypnea, altered mental status 2/2 infected diabetic/ischemic foot ulcer qSofa 2/3 with tachypnea and ams. BP stable at 161/82. WBC 16.5 102F in ED. Lactate 1.77 Blood culture collected. Bloody/purulent exudate from chronic foot ulcer w/ increased swelling and pain for last 7 days. X-ray negative for osteo. Recent aortogram show severe peripheral vascular disease with multiple occluded arteries bilaterally.  - F/u blood  cultures - C/w cefepime, vanc, flagyl - Trend CBC - Vascular surgery consult  Peripheral Vascular Disease Follows w/ Dr.Cain. 11/01/18 angiogram: occluded left SFA as well as occluded L anterior tibial artery as well as occluded right SFA and occluded R anterior tibial artery  - C/w home meds: simvastatin 40mg  daily, aspirin 81mg  daily  T2DM Unknown hgb a1c. On glimepiride at home. Admit bg 220 - Glucose checks - SSI  AKI 2/2 dehydration Most likely higher fluid requirement 2/2 ongoing infection. Baseline creatine 0.82. Admit creatine 1.36 Received 2.25cc NS in ED. - AM BMP  HTN Admit BP 161/81 - c/w home meds: metoprolol succinate 50mg  daily, losartan 100mg  daily  DVT prophx: subqheparin Diet: Diabetic/Cardiac, NPO past midnight Bowel: Senokot Code: Full  Dispo: Admit patient to Inpatient with expected length of stay greater than 2 midnights.  Signed: [name], MD 11/03/2018, 7:48 PM  Pager: [phone]

## 2018-11-03 NOTE — Consult Note (Addendum)
[facility]  Patient name: Jeremiah [name] MRN: [id number] DOB: 1946/11/05 Sex: male    HPI: Jeremiah [name] is a 72 y.o. male here today sending to the emergency department with fever.  He has a ulcer on his right lateral heel.  Had undergone arteriography 2 days ago and had consultation with Dr.Cain.  He has occlusion of his superficial femoral artery from just past the origin to the popliteal and also disease in the tibioperoneal trunk with two-vessel runoff.  He has a extensive amount of tissue loss on his right lateral heel.  Apparently he had been admitted with C. difficile and that was in a weakened state and probably developed pressure ulcer.  He reports some pain associated with this.  The plan is been for right leg femoral to popliteal bypass [facility]sey next week.  He did develop fever this evening and presented to the emergency department.  Internal medicine service has seen him and is initiating a sepsis work-up.  We were consulted due to his known heel issue  Past Medical History:  Diagnosis Date  . Arthritis    "joints; shoulders, knees, hands, back" (05/21/2018)  . C. difficile diarrhea 04/2018  . Diastolic CHF (HCC)   . High cholesterol   . History of gout   . Hypertension   . Peripheral vascular disease (HCC)   . Pneumonia    "couple times" (05/21/2018)  . Sleep apnea    "has mask; won't use" (05/21/2018)  . Type II diabetes mellitus (HCC)     Family History  Problem Relation Age of Onset  . Diabetes Mother   . Hypertension Mother   . Heart disease Father   . Heart attack Father   . Diabetes Sister   . Hypertension Sister   . Diabetes Brother   . Heart disease Brother   . Hypertension Brother   . Heart attack Brother     SOCIAL HISTORY: Social History   Tobacco Use  . Smoking status: Former Smoker    Years: 24.00    Types: Cigarettes    Last attempt to quit: 11/25/1983    Years since quitting:  34.9  . Smokeless tobacco: Never Used  Substance Use Topics  . Alcohol use: Not Currently    No Known Allergies  Current Facility-Administered Medications  Medication Dose Route Frequency Provider Last Rate Last Dose  . acetaminophen (TYLENOL) tablet 650 mg  650 mg Oral Q6H PRN Chundi, Vahini, MD       Or  . acetaminophen (TYLENOL) suppository 650 mg  650 mg Rectal Q6H PRN Chundi, Vahini, MD      . Melene Muller ON 11/04/2018] enoxaparin (LOVENOX) injection 40 mg  40 mg Subcutaneous Q24H Chundi, Vahini, MD      . metroNIDAZOLE (FLAGYL) IVPB 500 mg  500 mg Intravenous Q8H Joy, Shawn C, PA-C   Stopped at 11/03/18 1543  . promethazine (PHENERGAN) tablet 12.5 mg  12.5 mg Oral Q6H PRN Chundi, Vahini, MD      . senna-docusate (Senokot-S) tablet 1 tablet  1 tablet Oral QHS PRN Lorenso Courier, MD       Current Outpatient Medications  Medication Sig Dispense Refill  . acetaminophen (TYLENOL) 500 MG tablet Take 1,000 mg by mouth every 6 (six) hours as needed for moderate pain or headache.    Marland Kitchen aspirin 81 MG tablet Take 81 mg by mouth daily.    . brimonidine (ALPHAGAN) 0.2 % ophthalmic solution Place 1 drop into both eyes  2 (two) times daily.     . cadexomer iodine (IODOSORB) 0.9 % gel Apply 1 application topically daily as needed for wound care. 40 g 2  . glimepiride (AMARYL) 4 MG tablet Take 4 mg by mouth 2 (two) times daily.    Marland Kitchen. losartan (COZAAR) 100 MG tablet Take 100 mg by mouth daily.  3  . metoprolol succinate (TOPROL-XL) 50 MG 24 hr tablet Take 1 tablet (50 mg total) by mouth daily. Take with or immediately following a meal. 30 tablet 3  . Multiple Vitamin (MULTIVITAMIN WITH MINERALS) TABS tablet Take 1 tablet by mouth daily.    . simvastatin (ZOCOR) 40 MG tablet Take 40 mg by mouth daily.    . timolol (TIMOPTIC) 0.5 % ophthalmic solution Place 1 drop into both eyes 2 (two) times daily.  6    REVIEW OF SYSTEMS:  [X]  denotes positive finding, [ ]  denotes negative finding Cardiac  Comments:    Chest pain or chest pressure:    Shortness of breath upon exertion:    Short of breath when lying flat:    Irregular heart rhythm:        Vascular    Pain in calf, thigh, or hip brought on by ambulation:    Pain in feet at night that wakes you up from your sleep:  x   Blood clot in your veins:    Leg swelling:           PHYSICAL EXAM: Vitals:   11/03/18 1515 11/03/18 1530 11/03/18 1810 11/03/18 1858  BP: 136/65 131/61  (!) 158/56  Pulse: 88 84  83  Resp: 20 20  (!) 21  Temp:   98.8 F (37.1 C)   TempSrc:   Oral   SpO2: 92% 94%  96%    GENERAL: The patient is a well-nourished male, in no acute distress. The vital signs are documented above. CARDIOVASCULAR: No palpable pedal pulses bilaterally PULMONARY: There is good air exchange  MUSCULOSKELETAL: There are no major deformities or cyanosis. NEUROLOGIC: No focal weakness or paresthesias are detected. SKIN: No areas of tissue loss on the forefoot.  He does have a area of full-thickness loss which appears to be a pressure wound over his right posterior lateral heel.  Does have some blistering and skin slough and obvious full-thickness skin loss. PSYCHIATRIC: The patient has a normal affect.  DATA:  Arteriogram was reviewed showing a right superficial artery occlusion  Vein map showed a very small marginal saphenous vein for bypass  MEDICAL ISSUES: Discussed with the patient and his family present.  Agree with admission for antibiotics.  Reviewed Dr. Carola RhineKane's note where he had discussed the potential of the limb loss with or without bypass.  He does have extensive tissue loss and I reiterated to the patient and his family that even with revascularization this is a very difficult area to heal with extensive tissue loss on his heel.  His plain films showed no evidence of gas and no evidence of bony lesions.  Will notify Dr. Randie Heinzain in the morning of the patient's admission.  Will keep NPO past midnight    Larina Earthlyodd F. Bowie Delia, MD  [facility] Office Tel [phone] Pager [phone]

## 2018-11-03 NOTE — ED Provider Notes (Signed)
[facility] EMERGENCY DEPARTMENT Provider Note   CSN: [id number] Arrival date & time: 11/03/18  1231     History   Chief Complaint Chief Complaint  Patient presents with  . Code Sepsis    HPI Jeremiah [name] is a 72 y.o. male.  HPI   Jeremiah [name] is a 72 y.o. male, with a history of diastolic CHF, PVD, HTN, C. difficile, and DM presenting to the ED accompanied by his wife for abnormal behavior. Wife states she arrived home this morning to find patient slow to respond, disoriented, and incontinent of urine.  Also noted subjective fever. Patient has a wound on the right lateral foot that has been present for several months.  Over the last week or so wife states there has been increased discharge from the wound as well as increased swelling and pain.  No recent antibiotics.  Patient underwent abdominal aortogram on December 9 due to concern for worsening PAD.  For delusions were noted, especially in the right lower extremity.  He is scheduled for bypass graft December 17.  Patient denies chest pain, shortness of breath, abdominal pain, cough, urinary symptoms, or any other complaints.  Wife denies vomiting, diarrhea, falls, or any other abnormalities.  Past Medical History:  Diagnosis Date  . Arthritis    "joints; shoulders, knees, hands, back" (05/21/2018)  . C. difficile diarrhea 04/2018  . Diastolic CHF (HCC)   . High cholesterol   . History of gout   . Hypertension   . Peripheral vascular disease (HCC)   . Pneumonia    "couple times" (05/21/2018)  . Sleep apnea    "has mask; won't use" (05/21/2018)  . Type II diabetes mellitus [facility])     Patient Active Problem List   Diagnosis Date Noted  . Altered mental status   . Acute cystitis without hematuria   . Leukocytosis   . Pressure injury of skin 07/08/2018  . Clostridium difficile colitis 07/07/2018  . Hypokalemia 07/07/2018  . HLD (hyperlipidemia) 07/07/2018  . Acute diverticulitis 06/04/2018    . Cholecystitis 05/24/2018  . Cholecystitis with cholelithiasis 05/21/2018  . Acute systolic heart failure (HCC) 03/27/2018  . Diabetes mellitus without complication (HCC) 01/30/2018  . Hypertension 01/30/2018  . Acute cholecystitis 01/30/2018  . AKI (acute kidney injury) (HCC) 01/30/2018  . Normocytic anemia 01/30/2018  . PVD (peripheral vascular disease) (HCC) 09/05/2014    Past Surgical History:  Procedure Laterality Date  . ABDOMINAL AORTOGRAM W/LOWER EXTREMITY N/A 11/01/2018   Procedure: ABDOMINAL AORTOGRAM W/LOWER EXTREMITY;  Surgeon: Runell Gess, MD;  Location: [facility] INVASIVE CV LAB;  Service: Cardiovascular;  Laterality: N/A;  . CATARACT EXTRACTION W/ INTRAOCULAR LENS  IMPLANT, BILATERAL Bilateral   . CHOLECYSTECTOMY  05/21/2018   ATTEMPTED LAPAROSCOPIC CHOLECYSTECTOMY, OPEN DRAINAGE OF GALLBLADDER WITH BIOPSY  . CHOLECYSTECTOMY N/A 05/21/2018   Procedure: ATTEMPTED LAPAROSCOPIC CHOLECYSTECTOMY, OPEN DRAINAGE OF GALLBLADDER WITH BIOPSY;  Surgeon: Griselda Miner, MD;  Location: [facility] OR;  Service: General;  Laterality: N/A;  . COLONOSCOPY W/ POLYPECTOMY    . COLONOSCOPY WITH PROPOFOL N/A 09/16/2018   Procedure: COLONOSCOPY WITH PROPOFOL;  Surgeon: Charlott Rakes, MD;  Location: [facility] ENDOSCOPY;  Service: Endoscopy;  Laterality: N/A;  . 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 IR CATHETER TUBE CHANGE  04/14/2018  . IR CHOLANGIOGRAM EXISTING TUBE  03/17/2018  . IR PERC CHOLECYSTOSTOMY  01/31/2018  . IR  RADIOLOGIST EVAL & MGMT  03/02/2018        Home Medications    Prior to Admission medications   Medication Sig Start Date End Date Taking? Authorizing Provider  acetaminophen (TYLENOL) 500 MG tablet Take 1,000 mg by mouth every 6 (six) hours as needed for moderate pain or headache.    [provider]  aspirin 81 MG tablet Take 81 mg by mouth daily.     [provider]  brimonidine (ALPHAGAN) 0.2 % ophthalmic solution Place 1 drop into both eyes 2 (two) times daily.     [provider]  cadexomer iodine (IODOSORB) 0.9 % gel Apply 1 application topically daily as needed for wound care. 10/14/18   Hyatt, Max T, DPM  glimepiride (AMARYL) 4 MG tablet Take 4 mg by mouth 2 (two) times daily.    [provider]  losartan (COZAAR) 100 MG tablet Take 100 mg by mouth daily. 03/01/18   [provider]  metoprolol succinate (TOPROL-XL) 50 MG 24 hr tablet Take 1 tablet (50 mg total) by mouth daily. Take with or immediately following a meal. 04/02/18   Rinaldo Cloud, MD  Multiple Vitamin (MULTIVITAMIN WITH MINERALS) TABS tablet Take 1 tablet by mouth daily.    [provider]  simvastatin (ZOCOR) 40 MG tablet Take 40 mg by mouth daily.    [provider]  timolol (TIMOPTIC) 0.5 % ophthalmic solution Place 1 drop into both eyes 2 (two) times daily. 01/27/18   [provider]    Family History Family History  Problem Relation Age of Onset  . Diabetes Mother   . Hypertension Mother   . Heart disease Father   . Heart attack Father   . Diabetes Sister   . Hypertension Sister   . Diabetes Brother   . Heart disease Brother   . Hypertension Brother   . Heart attack Brother     Social History Social History   Tobacco Use  . Smoking status: Former Smoker    Years: 24.00    Types: Cigarettes    Last attempt to quit: 11/25/1983    Years since quitting: 34.9  . Smokeless tobacco: Never Used  Substance Use Topics  . Alcohol use: Not Currently  . Drug use: No     Allergies   Patient has no known allergies.   Review of Systems Review of Systems  Unable to perform ROS: Mental status change  Constitutional: Positive for fever.  Respiratory: Negative for shortness of breath.   Cardiovascular: Negative for chest pain.  Gastrointestinal: Negative for abdominal pain, diarrhea, nausea and  vomiting.  Skin: Positive for wound.     Physical Exam Updated Vital Signs BP (!) 161/81 (BP Location: Left Arm)   Pulse 99   Temp (!) 102 F (38.9 C) (Rectal)   Resp 16   SpO2 94%   Physical Exam  Constitutional: He appears well-developed and well-nourished. No distress.  HENT:  Head: Normocephalic and atraumatic.  Eyes: Pupils are equal, round, and reactive to light. Conjunctivae and EOM are normal.  Neck: Normal range of motion. Neck supple.  Cardiovascular: Normal rate, regular rhythm, normal heart sounds and intact distal pulses.  Pulmonary/Chest: Effort normal and breath sounds normal. No respiratory distress.  Abdominal: Soft. There is no tenderness. There is no guarding.  Genitourinary:  Genitourinary Comments: Aortogram access wound in the left groin appears to be healing well without tenderness, swelling, erythema, or pain.  Musculoskeletal: He exhibits edema and tenderness.  Wound to the  right posterior, lateral foot with crusting exudate.  Area is tender and edematous.  Lymphadenopathy:    He has no cervical adenopathy.  Neurological: He is alert.  Spontaneously alert.  Patient correctly follows commands.  He answers standard orientation questions correctly, however, he seems slow to respond.   Sensation grossly intact to light touch in the extremities. Strength 5/5 in all extremities. Coordination intact. Cranial nerves III-XII grossly intact. No facial droop.   Skin: Skin is warm and dry. He is not diaphoretic.  Psychiatric: He has a normal mood and affect. His behavior is normal.  Nursing note and vitals reviewed.    ED Treatments / Results  Labs (all labs ordered are listed, but only abnormal results are displayed) Labs Reviewed  COMPREHENSIVE METABOLIC PANEL - Abnormal; Notable for the following components:      Result Value   Glucose, Bld 220 (*)    Creatinine, Ser 1.36 (*)    Calcium 8.6 (*)    Albumin 2.2 (*)    GFR calc non Af Amer 51 (*)    GFR  calc Af Amer 59 (*)    All other components within normal limits  CBC WITH DIFFERENTIAL/PLATELET - Abnormal; Notable for the following components:   WBC 16.5 (*)    Hemoglobin 10.9 (*)    HCT 36.2 (*)    MCH 25.3 (*)    Neutro Abs 13.6 (*)    Monocytes Absolute 1.6 (*)    All other components within normal limits  CULTURE, BLOOD (ROUTINE X 2)  CULTURE, BLOOD (ROUTINE X 2)  URINE CULTURE  URINALYSIS, ROUTINE W REFLEX MICROSCOPIC  I-STAT CG4 LACTIC ACID, ED  I-STAT CG4 LACTIC ACID, ED    EKG None  Radiology Dg Chest Port 1 View  Result Date: 11/03/2018 CLINICAL DATA:  Sepsis. EXAM: PORTABLE CHEST 1 VIEW COMPARISON:  Radiograph of July 07, 2018. FINDINGS: Stable cardiomediastinal silhouette. Atherosclerosis of thoracic aorta is noted. No pneumothorax is noted. Left lung is clear. Mild right pleural effusion is noted with associated atelectasis or infiltrate. Bony thorax is unremarkable. IMPRESSION: Mild right pleural effusion with associated atelectasis or infiltrate. Aortic Atherosclerosis (ICD10-I70.0). Electronically Signed   By: Lupita Raider, M.D.   On: 11/03/2018 13:39   Dg Foot Complete Right  Result Date: 11/03/2018 CLINICAL DATA:  Sepsis. EXAM: RIGHT FOOT COMPLETE - 3+ VIEW COMPARISON:  None. FINDINGS: There is no evidence of fracture or dislocation. There is no evidence of arthropathy or other focal bone abnormality. Mild posterior calcaneal spurring is noted. Vascular calcifications are noted. IMPRESSION: No lytic destruction is seen to suggest osteomyelitis. No fracture or dislocation is seen. Electronically Signed   By: Lupita Raider, M.D.   On: 11/03/2018 13:38   Vas [place] Lower Extremity Saphenous Vein Mapping  Result Date: 11/03/2018 LOWER EXTREMITY VEIN MAPPING Other Indications: PAD  Performing Technologist: Blanch Media RVS  Examination Guidelines: A complete evaluation includes B-mode imaging, spectral Doppler, color Doppler, and power Doppler as needed of all  accessible portions of each vessel. Bilateral testing is considered an integral part of a complete examination. Limited examinations for reoccurring indications may be performed as noted. +---------------+----------+---------------------+--------------+--------------+   RT Diameter      RT             GSV          LT Diameter   LT Findings        (cm)       Findings                           (  cm)                    +---------------+----------+---------------------+--------------+--------------+      0.40                   Saphenofemoral         0.33       branching                                   Junction                                    +---------------+----------+---------------------+--------------+--------------+      0.30                   Proximal thigh         0.22       branching    +---------------+----------+---------------------+--------------+--------------+      0.31                      Mid thigh           0.16       branching    +---------------+----------+---------------------+--------------+--------------+      0.27                    Distal thigh          0.17                    +---------------+----------+---------------------+--------------+--------------+      0.22                        Knee                       not visualized +---------------+----------+---------------------+--------------+--------------+      0.28      branching       Prox calf           0.14       branching    +---------------+----------+---------------------+--------------+--------------+      0.34      branching       Mid calf            0.13                    +---------------+----------+---------------------+--------------+--------------+      0.32      branching      Distal calf          0.21                    +---------------+----------+---------------------+--------------+--------------+      0.29                        Ankle             0.22                     +---------------+----------+---------------------+--------------+--------------+ +----------------+-----------+---------------+----------------+-----------+ RT diameter (cm)RT Findings      SSV      LT Diameter (cm)LT Findings +----------------+-----------+---------------+----------------+-----------+       0.50                 Popliteal fossa                            +----------------+-----------+---------------+----------------+-----------+  0.39                  Proximal calf                             +----------------+-----------+---------------+----------------+-----------+       0.57                    Mid calf                                +----------------+-----------+---------------+----------------+-----------+       0.25                   Distal calf                              +----------------+-----------+---------------+----------------+-----------+ Diagnosing physician: Gretta Beganodd Early MD Electronically signed by Gretta Beganodd Early MD on 11/03/2018 at 2:04:55 PM.    Final     Procedures Procedures (including critical care time)  Medications Ordered in ED Medications  metroNIDAZOLE (FLAGYL) IVPB 500 mg (0 mg Intravenous Stopped 11/03/18 1543)  ceFEPIme (MAXIPIME) 2 g in sodium chloride 0.9 % 100 mL IVPB (0 g Intravenous Stopped 11/03/18 1421)  vancomycin (VANCOCIN) IVPB 1000 mg/200 mL premix (0 mg Intravenous Stopped 11/03/18 1453)  sodium chloride 0.9 % bolus 1,000 mL (0 mLs Intravenous Stopped 11/03/18 1423)    And  sodium chloride 0.9 % bolus 1,000 mL (0 mLs Intravenous Stopped 11/03/18 1454)    And  sodium chloride 0.9 % bolus 250 mL (0 mLs Intravenous Stopped 11/03/18 1543)  acetaminophen (TYLENOL) tablet 1,000 mg (1,000 mg Oral Given 11/03/18 1424)     Initial Impression / Assessment and Plan / ED Course  I have reviewed the triage vital signs and the nursing notes.  Pertinent labs & imaging results that were available during  my care of the patient were reviewed by me and considered in my medical decision making (see chart for details).  Clinical Course as of Nov 03 1604  Wed Nov 03, 2018  1440 Patient seems to have improved. Seems to be more alert and readily responsive. Lungs reassessed.  No increased work of breathing.  No tachypnea.  Lungs are clear.   [[facility]]  1558 Spoke with Dr. Delma Officerhundi, IM resident. States they will come evaluate the patient for admission.   [SJ]    Clinical Course User Index [[facility]] Gianny Sabino C, PA-C    Patient presents with generalized weakness and change in behavior.  Noted to be generally weak, tachycardic, tachypneic, and febrile.  Patient's right foot wound as possible source.  Improvement with fever control and IV fluids.  Admitted for IV antibiotics and further evaluation.  Findings and plan of care discussed with Pricilla LovelessScott Goldston, MD. Dr. Criss AlvineGoldston personally evaluated and examined this patient.  Vitals:   11/03/18 1330 11/03/18 1345 11/03/18 1400 11/03/18 1415  BP: 138/71 128/62 128/69 136/66  Pulse:   98 98  Resp: (!) 24 (!) 23 (!) 21 20  Temp:      TempSrc:      SpO2:   95% 94%      Final Clinical Impressions(s) / ED Diagnoses   Final diagnoses:  Fever, unspecified fever cause  Open wound of right foot, subsequent encounter    ED Discharge Orders    None  Anselm Pancoast, PA-C 11/03/18 1605    Pricilla Loveless, MD 11/08/18 [phone]

## 2018-11-03 NOTE — ED Notes (Signed)
ED TO INPATIENT HANDOFF REPORT  Name/Age/Gender Jeremiah [name] 72 y.o. male  Code Status    Code Status Orders  (From admission, onward)         Start     Ordered   11/03/18 1605  Full code  Continuous     11/03/18 1606        Code Status History    Date Active Date Inactive Code Status Order ID Comments User Context   11/01/2018 1539 11/01/2018 2039 Full Code [id number]  Runell GessBerry, Jonathan J, MD Inpatient   07/07/2018 1611 07/11/2018 1405 Full Code [id number]  Merlene LaughterSheikh, Omair Latif, DO Inpatient   06/04/2018 1704 06/06/2018 1842 Full Code [id number]  Darlin DropHall, Carole N, DO ED   05/21/2018 1400 05/25/2018 2118 Full Code [id number]  Griselda Mineroth, Paul III, MD Inpatient   04/13/2018 2318 04/17/2018 1455 Full Code [id number]  Onnie BoerEmokpae, Ejiroghene E, MD Inpatient   03/27/2018 1550 04/01/2018 1623 Full Code [id number]  Orpah CobbKadakia, Ajay, MD ED   01/30/2018 2045 02/02/2018 1646 Full Code [id number]  Opyd, Lavone Neriimothy S, MD ED      Home/SNF/Other Home  Chief Complaint sepsis  Level of Care/Admitting Diagnosis ED Disposition    ED Disposition Condition Comment   Admit  [facility] [[id number]]  Level of Care: Telemetry [5]  Diagnosis: Sepsis [facility](HCC) [[id number]]) [[id number]]  Admitting Physician: Inez CatalinaMULLEN, EMILY B [phone][[id number]]  Attending Physician: Inez CatalinaMULLEN, EMILY B [phone][[id number]]  Estimated length of stay: past midnight tomorrow  Certification:: I certify this patient will need inpatient services for at least 2 midnights  PT Class (Do Not Modify): Inpatient [101]  PT Acc Code (Do Not Modify): Private [1]       Medical History Past Medical History:  Diagnosis Date  . Arthritis    "joints; shoulders, knees, hands, back" (05/21/2018)  . C. difficile diarrhea 04/2018  . Diastolic CHF (HCC)   . High cholesterol   . History of gout   . Hypertension   . Peripheral vascular disease (HCC)   . Pneumonia    "couple times" (05/21/2018)  . Sleep apnea    "has mask; won't use" (05/21/2018)  . Type II diabetes mellitus (HCC)      Allergies No Known Allergies  IV Location/Drains/Wounds Patient Lines/Drains/Airways Status   Active Line/Drains/Airways    Name:   Placement date:   Placement time:   Site:   Days:   Peripheral IV 07/07/18 Left;Lateral Forearm   07/07/18    0921    Forearm   119   Peripheral IV 11/03/18 Right Hand   11/03/18    1231    Hand   less than 1   Peripheral IV 11/03/18 Left Hand   11/03/18    1255    Hand   less than 1   Closed System Drain Lateral RUQ Other (Comment)   04/14/18    -    RUQ   203   Closed System Drain 1 Right Abdomen Bulb (JP) 19 Fr.   05/21/18    -    Abdomen   166   External Urinary Catheter   07/08/18    1720    -   118   Incision (Closed) 05/21/18 Abdomen   05/21/18    1207     166   Pressure Injury 07/07/18 Deep Tissue Injury - Purple or maroon localized area of discolored intact skin or blood-filled blister due to damage of underlying soft tissue from pressure and/or shear.   07/07/18  1620     119   Pressure Injury 07/07/18 Deep Tissue Injury - Purple or maroon localized area of discolored intact skin or blood-filled blister due to damage of underlying soft tissue from pressure and/or shear.   07/07/18    1620     119          Labs/Imaging Results for orders placed or performed during the hospital encounter of 11/03/18 (from the past 48 hour(s))  Comprehensive metabolic panel     Status: Abnormal   Collection Time: 11/03/18  1:00 PM  Result Value Ref Range   Sodium 136 135 - 145 mmol/L   Potassium 4.1 3.5 - 5.1 mmol/L   Chloride 100 98 - 111 mmol/L   CO2 22 22 - 32 mmol/L   Glucose, Bld 220 (H) 70 - 99 mg/dL   BUN 16 8 - 23 mg/dL   Creatinine, Ser 1.61 (H) 0.61 - 1.24 mg/dL   Calcium 8.6 (L) 8.9 - 10.3 mg/dL   Total Protein 7.4 6.5 - 8.1 g/dL   Albumin 2.2 (L) 3.5 - 5.0 g/dL   AST 19 15 - 41 U/L   ALT 14 0 - 44 U/L   Alkaline Phosphatase 93 38 - 126 U/L   Total Bilirubin 0.8 0.3 - 1.2 mg/dL   GFR calc non Af Amer 51 (L) >60 mL/min   GFR calc Af Amer  59 (L) >60 mL/min   Anion gap 14 5 - 15    Comment: Performed at [facility], [address] [place], [place] [address]  CBC WITH DIFFERENTIAL     Status: Abnormal   Collection Time: 11/03/18  1:00 PM  Result Value Ref Range   WBC 16.5 (H) 4.0 - 10.5 K/uL   RBC 4.30 4.22 - 5.81 MIL/uL   Hemoglobin 10.9 (L) 13.0 - 17.0 g/dL   HCT 54.0 (L) 98.1 - 19.1 %   MCV 84.2 80.0 - 100.0 fL   MCH 25.3 (L) 26.0 - 34.0 pg   MCHC 30.1 30.0 - 36.0 g/dL   RDW 47.8 29.5 - 62.1 %   Platelets 251 150 - 400 K/uL   nRBC 0.0 0.0 - 0.2 %   Neutrophils Relative % 83 %   Neutro Abs 13.6 (H) 1.7 - 7.7 K/uL   Lymphocytes Relative 7 %   Lymphs Abs 1.2 0.7 - 4.0 K/uL   Monocytes Relative 9 %   Monocytes Absolute 1.6 (H) 0.1 - 1.0 K/uL   Eosinophils Relative 1 %   Eosinophils Absolute 0.1 0.0 - 0.5 K/uL   Basophils Relative 0 %   Basophils Absolute 0.0 0.0 - 0.1 K/uL   Immature Granulocytes 0 %   Abs Immature Granulocytes 0.06 0.00 - 0.07 K/uL    Comment: Performed at [facility], [address] [place], [place] [address]  Urinalysis, Routine w reflex microscopic     Status: Abnormal   Collection Time: 11/03/18  1:00 PM  Result Value Ref Range   Color, Urine YELLOW YELLOW   APPearance CLEAR CLEAR   Specific Gravity, Urine 1.020 1.005 - 1.030   pH 5.0 5.0 - 8.0   Glucose, UA NEGATIVE NEGATIVE mg/dL   Hgb urine dipstick SMALL (A) NEGATIVE   Bilirubin Urine NEGATIVE NEGATIVE   Ketones, ur NEGATIVE NEGATIVE mg/dL   Protein, ur 784 (A) NEGATIVE mg/dL   Nitrite NEGATIVE NEGATIVE   Leukocytes, UA NEGATIVE NEGATIVE   RBC / HPF 0-5 0 - 5 RBC/hpf   WBC, UA 0-5  0 - 5 WBC/hpf   Bacteria, UA NONE SEEN NONE SEEN   Mucus PRESENT     Comment: Performed at [facility], [address], [place], [place] [address]  I-Stat CG4 Lactic Acid, ED     Status: None   Collection Time: 11/03/18  1:12 PM  Result Value Ref Range   Lactic Acid, Venous 1.77 0.5 - 1.9 mmol/L   Ct Head Wo Contrast  Result  Date: 11/03/2018 CLINICAL DATA:  72 year old male with progressive weakness. Peripheral vascular disease and wound infection. EXAM: CT HEAD WITHOUT CONTRAST TECHNIQUE: Contiguous axial images were obtained from the base of the skull through the vertex without intravenous contrast. COMPARISON:  Head CT 07/20/2018 and earlier. FINDINGS: Brain: Stable cerebral volume. Mild for age patchy bilateral cerebral white matter hypodensity. No midline shift, ventriculomegaly, mass effect, evidence of mass lesion, intracranial hemorrhage or evidence of cortically based acute infarction. No cortical encephalomalacia identified. Vascular: Extensive Calcified atherosclerosis at the skull base. No suspicious intracranial vascular hyperdensity. Skull: Negative. Sinuses/Orbits: Visualized paranasal sinuses and mastoids are stable and well pneumatized. Other: Stable postoperative changes to both orbits. No acute scalp soft tissue finding. IMPRESSION: No acute intracranial abnormality. Stable chronic cerebral white matter changes. Electronically Signed   By: Odessa Fleming M.D.   On: 11/03/2018 16:26   Dg Chest Port 1 View  Result Date: 11/03/2018 CLINICAL DATA:  Sepsis. EXAM: PORTABLE CHEST 1 VIEW COMPARISON:  Radiograph of July 07, 2018. FINDINGS: Stable cardiomediastinal silhouette. Atherosclerosis of thoracic aorta is noted. No pneumothorax is noted. Left lung is clear. Mild right pleural effusion is noted with associated atelectasis or infiltrate. Bony thorax is unremarkable. IMPRESSION: Mild right pleural effusion with associated atelectasis or infiltrate. Aortic Atherosclerosis (ICD10-I70.0). Electronically Signed   By: Lupita Raider, M.D.   On: 11/03/2018 13:39   Dg Foot Complete Right  Result Date: 11/03/2018 CLINICAL DATA:  Sepsis. EXAM: RIGHT FOOT COMPLETE - 3+ VIEW COMPARISON:  None. FINDINGS: There is no evidence of fracture or dislocation. There is no evidence of arthropathy or other focal bone abnormality. Mild  posterior calcaneal spurring is noted. Vascular calcifications are noted. IMPRESSION: No lytic destruction is seen to suggest osteomyelitis. No fracture or dislocation is seen. Electronically Signed   By: Lupita Raider, M.D.   On: 11/03/2018 13:38   None  Pending Labs Unresulted Labs (From admission, onward)    Start     Ordered   11/04/18 0500  CBC  Tomorrow morning,   R     11/03/18 1934   11/04/18 0500  Basic metabolic panel  Tomorrow morning,   R     11/03/18 1934   11/04/18 0500  Hemoglobin A1c  Tomorrow morning,   R     11/03/18 1934   11/04/18 0500  Lipid panel  Tomorrow morning,   R     11/03/18 2024   11/03/18 1336  Urine culture  ONCE - STAT,   STAT     11/03/18 1335   11/03/18 1251  Blood Culture (routine x 2)  BLOOD CULTURE X 2,   STAT     11/03/18 1256          Vitals/Pain Today's Vitals   11/03/18 1858 11/03/18 2000 11/03/18 2100 11/03/18 2124  BP: (!) 158/56 (!) 134/58 (!) 148/72   Pulse: 83 78 77   Resp: (!) 21 (!) 22 19   Temp:      TempSrc:      SpO2: 96% 99% 98%  PainSc:    0-No pain    Isolation Precautions No active isolations  Medications Medications  metroNIDAZOLE (FLAGYL) IVPB 500 mg (0 mg Intravenous Stopped 11/03/18 1543)  acetaminophen (TYLENOL) tablet 650 mg (has no administration in time range)    Or  acetaminophen (TYLENOL) suppository 650 mg (has no administration in time range)  senna-docusate (Senokot-S) tablet 1 tablet (has no administration in time range)  promethazine (PHENERGAN) tablet 12.5 mg (has no administration in time range)  insulin aspart (novoLOG) injection 0-15 Units (has no administration in time range)  aspirin chewable tablet 81 mg (has no administration in time range)  metoprolol succinate (TOPROL-XL) 24 hr tablet 50 mg (has no administration in time range)  simvastatin (ZOCOR) tablet 40 mg (has no administration in time range)  losartan (COZAAR) tablet 100 mg (has no administration in time range)  heparin  injection 5,000 Units (has no administration in time range)  ceFEPIme (MAXIPIME) 2 g in sodium chloride 0.9 % 100 mL IVPB (0 g Intravenous Stopped 11/03/18 1421)  vancomycin (VANCOCIN) IVPB 1000 mg/200 mL premix (0 mg Intravenous Stopped 11/03/18 1453)  sodium chloride 0.9 % bolus 1,000 mL (0 mLs Intravenous Stopped 11/03/18 1423)    And  sodium chloride 0.9 % bolus 1,000 mL (0 mLs Intravenous Stopped 11/03/18 1454)    And  sodium chloride 0.9 % bolus 250 mL (0 mLs Intravenous Stopped 11/03/18 1543)  acetaminophen (TYLENOL) tablet 1,000 mg (1,000 mg Oral Given 11/03/18 1424)    Mobility walks with device

## 2018-11-03 NOTE — ED Notes (Signed)
Patient transported to CT 

## 2018-11-03 NOTE — ED Notes (Signed)
Got patient undress on the monitor did ekg shown to Dr Criss AlvineGoldston patient is resting with nurse at bedside call bell in reach

## 2018-11-03 NOTE — ED Triage Notes (Addendum)
Pt arrives to ED from home with complaints of a infected wound that needs checking. EMS reports pt has been feeling progressively weaker since the surgery 2-3 months ago and pt has been having fevers and chills at home. Pt had a angiogram last week and said pt has 100% blockage in both legs. Pt has hx of PVD. Pt placed in position of comfort with bed locked and lowered, call bell in reach.

## 2018-11-04 DIAGNOSIS — E1151 Type 2 diabetes mellitus with diabetic peripheral angiopathy without gangrene: Secondary | ICD-10-CM

## 2018-11-04 DIAGNOSIS — Z7984 Long term (current) use of oral hypoglycemic drugs: Secondary | ICD-10-CM

## 2018-11-04 DIAGNOSIS — S91301A Unspecified open wound, right foot, initial encounter: Secondary | ICD-10-CM | POA: Diagnosis present

## 2018-11-04 DIAGNOSIS — Z7982 Long term (current) use of aspirin: Secondary | ICD-10-CM

## 2018-11-04 DIAGNOSIS — E860 Dehydration: Secondary | ICD-10-CM

## 2018-11-04 DIAGNOSIS — R011 Cardiac murmur, unspecified: Secondary | ICD-10-CM

## 2018-11-04 DIAGNOSIS — I5030 Unspecified diastolic (congestive) heart failure: Secondary | ICD-10-CM

## 2018-11-04 DIAGNOSIS — N179 Acute kidney failure, unspecified: Secondary | ICD-10-CM

## 2018-11-04 DIAGNOSIS — R509 Fever, unspecified: Secondary | ICD-10-CM | POA: Diagnosis present

## 2018-11-04 DIAGNOSIS — L97419 Non-pressure chronic ulcer of right heel and midfoot with unspecified severity: Secondary | ICD-10-CM

## 2018-11-04 DIAGNOSIS — E11621 Type 2 diabetes mellitus with foot ulcer: Secondary | ICD-10-CM

## 2018-11-04 DIAGNOSIS — M109 Gout, unspecified: Secondary | ICD-10-CM

## 2018-11-04 LAB — BLOOD CULTURE ID PANEL (REFLEXED)

## 2018-11-04 LAB — BODY FLUID CELL COUNT WITH DIFFERENTIAL
Eos, Fluid: 2 %
Lymphs, Fluid: 92 %
Monocyte-Macrophage-Serous Fluid: 4 % — ABNORMAL LOW (ref 50–90)
Neutrophil Count, Fluid: 2 % (ref 0–25)
Total Nucleated Cell Count, Fluid: 3550 cu mm — ABNORMAL HIGH (ref 0–1000)

## 2018-11-04 LAB — LACTATE DEHYDROGENASE, PLEURAL OR PERITONEAL FLUID: LD, Fluid: 176 U/L — ABNORMAL HIGH (ref 3–23)

## 2018-11-04 LAB — URINE CULTURE: Culture: NO GROWTH

## 2018-11-04 LAB — GLUCOSE, CAPILLARY
Glucose-Capillary: 108 mg/dL — ABNORMAL HIGH (ref 70–99)
Glucose-Capillary: 112 mg/dL — ABNORMAL HIGH (ref 70–99)
Glucose-Capillary: 143 mg/dL — ABNORMAL HIGH (ref 70–99)
Glucose-Capillary: 97 mg/dL (ref 70–99)

## 2018-11-04 LAB — BASIC METABOLIC PANEL
Anion gap: 9 (ref 5–15)
BUN: 14 mg/dL (ref 8–23)
CO2: 25 mmol/L (ref 22–32)
Calcium: 8.2 mg/dL — ABNORMAL LOW (ref 8.9–10.3)
Chloride: 104 mmol/L (ref 98–111)
Creatinine, Ser: 1.4 mg/dL — ABNORMAL HIGH (ref 0.61–1.24)
GFR calc Af Amer: 57 mL/min — ABNORMAL LOW (ref >60)
GFR calc non Af Amer: 49 mL/min — ABNORMAL LOW (ref >60)
Glucose, Bld: 107 mg/dL — ABNORMAL HIGH (ref 70–99)
Potassium: 3.3 mmol/L — ABNORMAL LOW (ref 3.5–5.1)
Sodium: 138 mmol/L (ref 135–145)

## 2018-11-04 LAB — CBC
HCT: 23.1 % — ABNORMAL LOW (ref 39.0–52.0)
Hemoglobin: 7.4 g/dL — ABNORMAL LOW (ref 13.0–17.0)
MCH: 25.8 pg — ABNORMAL LOW (ref 26.0–34.0)
MCHC: 32 g/dL (ref 30.0–36.0)
MCV: 80.5 fL (ref 80.0–100.0)
Platelets: 257 10*3/uL (ref 150–400)
RBC: 2.87 MIL/uL — ABNORMAL LOW (ref 4.22–5.81)
RDW: 14.7 % (ref 11.5–15.5)
WBC: 14.6 10*3/uL — ABNORMAL HIGH (ref 4.0–10.5)
nRBC: 0.1 % (ref 0.0–0.2)

## 2018-11-04 LAB — HEMOGLOBIN A1C
Hgb A1c MFr Bld: 6.7 % — ABNORMAL HIGH (ref 4.8–5.6)
Mean Plasma Glucose: 145.59 mg/dL

## 2018-11-04 LAB — LIPID PANEL
Cholesterol: 100 mg/dL (ref 0–200)
HDL: 27 mg/dL — ABNORMAL LOW (ref >40)
LDL Cholesterol: 55 mg/dL (ref 0–99)
Total CHOL/HDL Ratio: 3.7 RATIO
Triglycerides: 91 mg/dL (ref <150)
VLDL: 18 mg/dL (ref 0–40)

## 2018-11-04 LAB — PROTEIN, PLEURAL OR PERITONEAL FLUID: Total protein, fluid: 3.4 g/dL

## 2018-11-04 LAB — ALBUMIN, PLEURAL OR PERITONEAL FLUID: Albumin, Fluid: 1.2 g/dL

## 2018-11-04 MED ORDER — SODIUM CHLORIDE 0.9 % IV SOLN
2.0000 g | INTRAVENOUS | Status: DC
  Administered 2018-11-05 – 2018-11-07 (×3): 2 g via INTRAVENOUS
  Filled 2018-11-04 (×3): qty 20

## 2018-11-04 MED ORDER — SODIUM CHLORIDE 0.9 % IV SOLN
INTRAVENOUS | Status: DC | PRN
  Administered 2018-11-04 – 2018-11-07 (×4): 250 mL via INTRAVENOUS
  Administered 2018-11-09: 08:00:00 via INTRAVENOUS

## 2018-11-04 MED ORDER — VANCOMYCIN HCL 10 G IV SOLR
1250.0000 mg | INTRAVENOUS | Status: DC
  Administered 2018-11-04: 1250 mg via INTRAVENOUS
  Filled 2018-11-04: qty 1250

## 2018-11-04 MED ORDER — SODIUM CHLORIDE 0.9 % IV SOLN
2.0000 g | INTRAVENOUS | Status: DC
  Administered 2018-11-04: 2 g via INTRAVENOUS
  Filled 2018-11-04: qty 2

## 2018-11-04 MED ORDER — POTASSIUM CHLORIDE CRYS ER 20 MEQ PO TBCR
40.0000 meq | EXTENDED_RELEASE_TABLET | Freq: Once | ORAL | Status: AC
  Administered 2018-11-04: 40 meq via ORAL
  Filled 2018-11-04: qty 2

## 2018-11-04 NOTE — Procedures (Signed)
Thoracentesis Procedure Note  Pre-operative Diagnosis:  Right pleural effusion  Indications:  Moderate right sided pleural effusion  Procedure Details:  Informed consent was obtained after explanation of the risks and benefits of the procedure, refer to the consent documentation.  Time-out Performed immediately prior to the procedure.  All available chest radiographs were reviewed and the patient was subsequently placed in a sitting/semi-recumbent position.  Using ultrasound guidance a moderate) pleural effusion was noted on the right.  The area was prepped with chlorhexadine and draped in sterile fashion.  Following this 1% lidocaine was injected subcutaneously and deep to provide anesthesia.  A small incision was then made parallel and superior to the rib, the thoracentesis needle with catheter was inserted into the chest wall and advanced under constant aspiration.  Upon aspiration of pleural fluid, the catheter was advanced into the pleural space and the needle was removed.  The catheter was then connected to a drainange bag and the fluid was removed under manual drainage. The catheter was then removed during slow forced exhalation and a sterile bandage was placed.  Findings:   700mL of serosanguinous pleural fluid with mild debris was removed.  Fluid was sent for protein, LDH, cytology  Condition: The patient tolerated the procedure well and remains in the same condition as pre-procedure.  Complications: None; patient tolerated the procedure well.  Plan: Post-procedure ultrasound showed appropriate lung sliding, which makes a pneumothorax unlikely.   Nyra MarketGorica Sherice Ijames, MD IMTS - PGY3

## 2018-11-04 NOTE — Progress Notes (Signed)
Pharmacy Antibiotic Note  Jeremiah [name] is a 72 y.o. male admitted on 11/03/2018 with wound infection, right heel.  Pharmacy has been consulted for Vancomycin and Cefepime dosing.   No evidence of osteomyelitis per MRI.  Recent arteriogram (12/9) for critical limb ischemia and non-healing right heel ulcer. VVS following for possible bypass procedure.    Tmax 102, WBC 16.5>14.6.   Received Vanc 1gm IV and Cefepime 2 gm IV in ED on 12/11 ~1pm; Flagyl also started in ED.  Plan:  Vancomycin 1250 mg IV q24hrs.  Goal AUC 400-500  Expected AUC: 524  Scr used: 1.40  Cefepime 2 gm IV q24hrs.  Continues on Flagyl 500 mg IV q8hrs.  Follow renal function, culture data, antibiotic plans.  Vanc levels if indicated.   Height: 5\' 9"  (175.3 cm) Weight: 172 lb 6.4 oz (78.2 kg) IBW/kg (Calculated) : 70.7  Temp (24hrs), Avg:99 F (37.2 C), Min:98.4 F (36.9 C), Max:99.3 F (37.4 C)  Recent Labs  Lab 11/03/18 1300 11/03/18 1312 11/04/18 0418 11/04/18 0613  WBC 16.5*  --   --  14.6*  CREATININE 1.36*  --  1.40*  --   LATICACIDVEN  --  1.77  --   --     Estimated Creatinine Clearance: 46.3 mL/min (A) (by C-G formula based on SCr of 1.4 mg/dL (H)).    No Known Allergies  Antimicrobials this admission:  Vancomycin 12/11>>  Cefepime 12/11>>  Flagyl 12/11>>   Dose adjustments this admission:  n/a  Microbiology results:  12/11 urine -  12/11 blood x 2 -  Thank you for allowing pharmacy to be a part of this patient's care.  Dennie Fettersgan, Maurisha Mongeau Donovan, [place] Pager: [phone] or phone: [phone] 11/04/2018 1:23 PM

## 2018-11-04 NOTE — Progress Notes (Signed)
  Progress Note    11/04/2018 1:37 PM * No surgery found *  Subjective: Feeling somewhat better today.  Vitals:   11/04/18 1011 11/04/18 1135  BP: 135/61 136/60  Pulse: 72 74  Resp: 18 18  Temp: 99.3 F (37.4 C) 99.2 F (37.3 C)  SpO2: 99% 98%    Physical Exam: Awake alert oriented Nonlabored respirations Abdomen soft Right lateral heel with dry gangrene and foul smell, no drainage and no exposed bone  CBC    Component Value Date/Time   WBC 14.6 (H) 11/04/2018 0613   RBC 2.87 (L) 11/04/2018 0613   HGB 7.4 (L) 11/04/2018 0613   HGB 8.9 (L) 10/26/2018 1215   HCT 23.1 (L) 11/04/2018 0613   HCT 27.2 (L) 10/26/2018 1215   PLT 257 11/04/2018 0613   PLT 264 10/26/2018 1215   MCV 80.5 11/04/2018 0613   MCV 80 10/26/2018 1215   MCH 25.8 (L) 11/04/2018 0613   MCHC 32.0 11/04/2018 0613   RDW 14.7 11/04/2018 0613   RDW 14.6 10/26/2018 1215   LYMPHSABS 1.2 11/03/2018 1300   LYMPHSABS 3.2 (H) 10/26/2018 1215   MONOABS 1.6 (H) 11/03/2018 1300   EOSABS 0.1 11/03/2018 1300   EOSABS 1.8 (H) 10/26/2018 1215   BASOSABS 0.0 11/03/2018 1300   BASOSABS 0.1 10/26/2018 1215    BMET    Component Value Date/Time   NA 138 11/04/2018 0418   NA 138 10/26/2018 1215   K 3.3 (L) 11/04/2018 0418   CL 104 11/04/2018 0418   CO2 25 11/04/2018 0418   GLUCOSE 107 (H) 11/04/2018 0418   BUN 14 11/04/2018 0418   BUN 15 10/26/2018 1215   CREATININE 1.40 (H) 11/04/2018 0418   CALCIUM 8.2 (L) 11/04/2018 0418   GFRNONAA 49 (L) 11/04/2018 0418   GFRAA 57 (L) 11/04/2018 0418    INR    Component Value Date/Time   INR 1.01 01/31/2018 0842     Intake/Output Summary (Last 24 hours) at 11/04/2018 1337 Last data filed at 11/04/2018 1115 Gross per 24 hour  Intake 3696.67 ml  Output -  Net 3696.67 ml   I reviewed the x-ray which did not demonstrate any gas or bony involvement  Assessment/plan:  72 y.o. 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  He is admitted concern for sepsis having fevers and leukocytosis.  Now on antibiotics.  I discussed with him the likelihood of requiring at least below possible above-knee amputation from this problem.  He is clearly upset by this as I would expect.  I have discussed with him that we can attempt bypass surgery the outcome will likely be amputation.  I will leave him on the schedule for next Tuesday for bypass if he elects to proceed with amputation prior to that we can arrange.  We will continue to follow.    Trayce Maino C. Randie Heinzain, MD [facility] Office: [phone] Pager: [phone]  11/04/2018 1:37 PM

## 2018-11-04 NOTE — Evaluation (Signed)
Physical Therapy Evaluation Patient Details Name: Jeremiah [name] MRN: [id number] DOB: 09/01/1946 Today's Date: 11/04/2018   History of Present Illness  Pt is a 72 y.o. male admitted 11/03/18 with RLE pain and AMS; worked up for sepsis. Xray without osteomyelitis. Head CT without acute abnormality. Of note pt with recent peripheral angiogram (11/01/18) showing occluded R/L SFA and L anterior tibial arteries with plan for bypass graft scheduled 12/17. Per vascular surgery, may require RLE amputation. PMH includes R foot ulcer, DM2, PVD, HTN, CHF, arthritis.    Clinical Impression  Pt presents with an overall decrease in functional mobility secondary to above. PTA, pt ambulatory with RW; requires assist from wife for ADLs. Today, pt required modA to stand and take steps to recliner; pt with bowel incontinence, requiring maxA for pericare and bathing. Pt alert, but demonstrates poor problem solving and poor insight into deficits. Recommend SNF-level therapies to maximize functional mobility and independence prior to return home, especially if to proceed with RLE amputation. Family hoping pt will be able to return home with increased assist from [facility] services, including an aide. Will follow acutely to address established goals.    Follow Up Recommendations SNF;Supervision for mobility/OOB    Equipment Recommendations  (TBD)    Recommendations for Other Services       Precautions / Restrictions Precautions Precautions: Fall Restrictions Weight Bearing Restrictions: No      Mobility  Bed Mobility Overal bed mobility: Needs Assistance Bed Mobility: Supine to Sit     Supine to sit: Mod assist;HOB elevated     General bed mobility comments: ModA to assist trunk elevation and assist hips to EOB  Transfers Overall transfer level: Needs assistance Equipment used: Rolling walker (2 wheeled) Transfers: Sit to/from Stand Sit to Stand: Mod assist;From elevated surface         General  transfer comment: Max encouragement to assist as much as possible, requiring modA for trunk elevation and to cue upright posture. Pt reaching to arm rest of recliner and starting to fall backwards, requiring max cues for safety and to keep hands on RW  Ambulation/Gait Ambulation/Gait assistance: Mod assist Gait Distance (Feet): 1 Feet Assistive device: Rolling walker (2 wheeled) Gait Pattern/deviations: Step-to pattern;Trunk flexed;Leaning posteriorly Gait velocity: Decreased   General Gait Details: Took pivotal steps from bed to recliner requiring modA to prevent posterior LOB; cues for sequencing and to prevent pt sitting prematurely. Pt easily distracted not following commands well  Stairs            Wheelchair Mobility    Modified Rankin (Stroke Patients Only)       Balance Overall balance assessment: Needs assistance Sitting-balance support: No upper extremity supported Sitting balance-Leahy Scale: Fair     Standing balance support: Bilateral upper extremity supported;During functional activity Standing balance-Leahy Scale: Poor                               Pertinent Vitals/Pain Pain Assessment: Faces Faces Pain Scale: Hurts a little bit Pain Location: R foot Pain Descriptors / Indicators: Discomfort Pain Intervention(s): Monitored during session;Limited activity within patient's tolerance    Home Living Family/patient expects to be discharged to:: Private residence Living Arrangements: Spouse/significant other Available Help at Discharge: Family;Available 24 hours/day Type of Home: House Home Access: Stairs to enter Entrance Stairs-Rails: Doctor, general practice of Steps: 5 Home Layout: One level Home Equipment: Walker - 2 wheels;Shower seat;Cane - single point;Bedside commode  Prior Function Level of Independence: Needs assistance   Gait / Transfers Assistance Needed: Able to perform household ambulation with RW  ADL's /  Homemaking Assistance Needed: Wife assists with sponge bathing at sink and other ADLs as needed (wife herself does HD MWF)        Hand Dominance        Extremity/Trunk Assessment   Upper Extremity Assessment Upper Extremity Assessment: Generalized weakness    Lower Extremity Assessment Lower Extremity Assessment: Generalized weakness       Communication   Communication: No difficulties  Cognition Arousal/Alertness: Awake/alert Behavior During Therapy: Flat affect Overall Cognitive Status: Impaired/Different from baseline Area of Impairment: Attention;Memory;Following commands;Safety/judgement;Awareness;Problem solving                   Current Attention Level: Selective Memory: Decreased short-term memory Following Commands: Follows one step commands inconsistently Safety/Judgement: Decreased awareness of safety;Decreased awareness of deficits Awareness: Intellectual Problem Solving: Slow processing;Decreased initiation;Difficulty sequencing;Requires verbal cues;Requires tactile cues General Comments: Pt had been visiting with family for a while and not told anybody he was sitting in bowel incontinence      General Comments General comments (skin integrity, edema, etc.): Wife and son present    Exercises     Assessment/Plan    PT Assessment Patient needs continued PT services  PT Problem List Decreased strength;Decreased activity tolerance;Decreased balance;Decreased mobility;Decreased cognition;Decreased knowledge of use of DME;Decreased safety awareness;Decreased knowledge of precautions;Decreased skin integrity       PT Treatment Interventions DME instruction;Gait training;Stair training;Functional mobility training;Therapeutic activities;Therapeutic exercise;Balance training;Patient/family education;Wheelchair mobility training    PT Goals (Current goals can be found in the Care Plan section)  Acute Rehab PT Goals Patient Stated Goal: Return home with  assist from CNA since wife has HD 3x/wk PT Goal Formulation: With family Time For Goal Achievement: 11/18/18 Potential to Achieve Goals: Fair    Frequency Min 3X/week   Barriers to discharge        Co-evaluation               AM-PAC PT "6 Clicks" Mobility  Outcome Measure Help needed turning from your back to your side while in a flat bed without using bedrails?: A Little Help needed moving from lying on your back to sitting on the side of a flat bed without using bedrails?: A Lot Help needed moving to and from a bed to a chair (including a wheelchair)?: A Lot Help needed standing up from a chair using your arms (e.g., wheelchair or bedside chair)?: A Lot Help needed to walk in hospital room?: A Lot Help needed climbing 3-5 steps with a railing? : A Lot 6 Click Score: 13    End of Session   Activity Tolerance: Patient limited by fatigue Patient left: in chair;with call bell/phone within reach;with chair alarm set;with family/visitor present Nurse Communication: Mobility status PT Visit Diagnosis: Other abnormalities of gait and mobility (R26.89);Muscle weakness (generalized) (M62.81)    Time: [id number] PT Time Calculation (min) (ACUTE ONLY): 33 min   Charges:   PT Evaluation $PT Eval Moderate Complexity: 1 Mod PT Treatments $Therapeutic Activity: 8-22 mins      Ina HomesJaclyn Abdulkadir Emmanuel, PT, DPT Acute Rehabilitation Services  Pager [phone] Office [phone]  Malachy ChamberJaclyn L Syrah Daughtrey 11/04/2018, 4:28 PM

## 2018-11-04 NOTE — Progress Notes (Signed)
  Date: 11/04/2018  Patient name: Jeremiah [name]  Medical record number: [id number]  Date of birth: 1946/10/15   I have seen and evaluated Jeremiah [name] and discussed their care with the Residency Team. Briefly, Jeremiah [name] is a 72 year old man with PMH of PVD, DM2 and recurrent Cdiff who presented with concern for sepsis, non healing right foot ulcer.  He has had worsening AMS and pain, but no fevers.  He has had plans to have right femoropopliteal bypass next week, but since he was getting worse, his family brought him in to the hospital.  In the Ed, he was found to be febrile, WBC of 16 (chronically elevated) and with AMS.  He was admitted for antibiotics.  When I saw him this morning, he was more alert and felt better.  He noted pain in the right foot.  He seemed to wish to proceed with surgery next week, not during this hospitalization, but he would like the team to discuss further with his family.     PMHx, Fam Hx, and/or Soc Hx : He has a strong family history of HTN, DM and heart disease, he reports being a never smoker  Vitals:   11/04/18 0502 11/04/18 1011  BP: (!) 141/90 135/61  Pulse: 75 72  Resp: 20 18  Temp: 98.4 F (36.9 C)   SpO2: 98% 99%   General: Sitting in bed, NAD, elderly gentleman Eyes: Anicteric sclerae, no conjunctival injection HENT: Neck is supple CV: RR, NR, + murmur best heard at LUSB Pulm: CTAB, no wheezing Abd: Soft, +BS, NT Ext: He has good warmth bilaterally, minimal edema at ankles Skin: He has a 1.5cm deep ulcer on right lateral heel which is non healing, with exudate and black in color  CXR: Right sided pleural effusion, present since at least May of this year.  No apparent work up has been done.  He has no respiratory complaints today.    Assessment and Plan: I have seen and evaluated the patient as outlined above. I agree with the formulated Assessment and Plan as detailed in the residents' note, with the following changes:   1. Non healing  diabetic foot wound, ischemic foot ulcer, possible sepsis - Started on broad spectrum Abx, which will be continued today - Vascular surgery consult - Dr. Randie Heinzain is recommending amputation which would be arranged prior to discharge, vs. Bypass next week.  - BC X 2 pending - NPO in case of need for surgery - Trend CBC - Pain control with tylenol.   2. Pleural effusion, right sided - Unclear cause, does not appear to have had a dx thoracentesis - Consider diagnostic tap this admission, he has a h/o CHF, however, this appears to be mostly right sided.  He is a non smoker by history.  This effusion is persistent now for 7 months and will need to be further evaluated.  - If patient does not wish to stay in hospital, would set up IR for thoracentesis in future. PCP listed as Dr. Sharyn LullHarwani (Cardiology) so unclear if he has a general MD/DO as PCP.   Other issues per Dr. Marigene EhlersLee's daily note (upcoming).      Inez CatalinaMullen, Morayo Leven B, MD 12/12/201910:20 AM

## 2018-11-04 NOTE — Progress Notes (Signed)
Subjective:  Jeremiah [name] is a 72 y.o. with PMH of diastolic CHF, PVD, HTN, recurrent C.diff, Gout and T2DM admit for infected R foot ulcer on hospital day 1  Jeremiah [name] was examined and evaluated at bedside this AM. He states he is not sure if he wants to go through with his surgery and would like to have further discussion with his vascular surgeon when his family is present. Otherwise he states he feels fine and has no complaints. AAOx3 and he denies any F/N/V/D/C.   Objective:  Vital signs in last 24 hours: Vitals:   11/04/18 0302 11/04/18 0502 11/04/18 1011 11/04/18 1135  BP:  (!) 141/90 135/61 136/60  Pulse:  75 72 74  Resp:  20 18 18   Temp:  98.4 F (36.9 C) 99.3 F (37.4 C) 99.2 F (37.3 C)  TempSrc:  Oral Oral Oral  SpO2:  98% 99% 98%  Weight: 78.2 kg     Height:       Physical Exam  Constitutional: He is oriented to person, place, and time and well-developed, well-nourished, and in no distress. No distress.  HENT:  Head: Normocephalic and atraumatic.  Mouth/Throat: Oropharynx is clear and moist. No oropharyngeal exudate.  Eyes: Pupils are equal, round, and reactive to light. Conjunctivae and EOM are normal. No scleral icterus.  Neck: Normal range of motion. Neck supple. No JVD present.  Cardiovascular: Normal rate, regular rhythm, normal heart sounds and intact distal pulses.  Pulmonary/Chest: Effort normal. He has no wheezes.  Diminished breath sounds on RLL  Musculoskeletal: Normal range of motion.        General: Edema (trace edema around bilateral ankles) present.  Neurological: He is alert and oriented to person, place, and time. No cranial nerve deficit. GCS score is 15.  Skin: Skin is warm and dry. He is not diaphoretic.  R lateral heel ulcer with bloody, foul smelling exudate    Assessment/Plan:  Active Problems:   Sepsis (HCC)   Fever   Open wound of right foot  Jeremiah [name] is a 72 yo M w/ PMH of diastolic CHF, PVD, HTN, recurrent C.Diff infection,  Gout, and T2DM presenting with infected foot ulcer. He is resistant to the prospect of amputation and knows that he has poor vasculature and low chance of recovery but needs more time to think about it, which he is discussing in depth with family and vascular surgery. Meanwhile, he has a known right pleural effusion which we will investigate to r/o other possible sources of infection and rule out malignancy.  Fever, tachypnea, altered mental status 2/2 infected diabetic/ischemic foot ulcer Afebrile overnight. WBC downtrending 16.5->14.6 Blood culture NGTD - C/w cefepime, vanc, flagyl - Trend CBC - Appreciate vascular recs  R [name] no further work up. Hx of HFpEF. Appear stable.  - Thoracentesis w/ body fluid studies - F/u Chest CT w/o contrast  Peripheral Vascular Disease Follows w/ Dr.Cain. LDL-C 55, HDL 27 11/01/18 angiogram: occluded left SFA as well as occluded L anterior tibial artery as well as occluded right SFA and occluded R anterior tibial artery  - C/w home meds: simvastatin 40mg  daily, aspirin 81mg  daily  T2DM Hgb a1c 6.7. On glimepiride at home. Fasting bg 107 this AM. Received no additional insulin yesterday - Glucose checks - SSI  AKI 2/2 dehydration Creatine trend 1.36->1.4 despite receiving 2.25L NS in ED yesterday - Trend BMP  HTN - c/w home meds: metoprolol succinate 50mg  daily,  losartan 100mg  daily  DVT prophx: subqheparin Diet: Diabetic Bowel: Senokot Code: Full  Dispo: Anticipated discharge in approximately 4 day(s).   Jeremiah Barrio, MD 11/04/2018, 4:29 PM Pager: [phone]

## 2018-11-04 NOTE — Plan of Care (Signed)
  Problem: Education: Goal: Knowledge of General Education information will improve Description Including pain rating scale, medication(s)/side effects and non-pharmacologic comfort measures Outcome: Progressing   Problem: Health Behavior/Discharge Planning: Goal: Ability to manage health-related needs will improve Outcome: Progressing   

## 2018-11-04 NOTE — Progress Notes (Signed)
PHARMACY - PHYSICIAN COMMUNICATION CRITICAL VALUE ALERT - BLOOD CULTURE IDENTIFICATION (BCID)  Jeremiah [name] is an 72 y.o. male who [facility] with a chief complaint non-healing R-leg wound and AMS  Assessment: 72 YOM currently on antibiotics for empiric coverage of infected DFI and none-healing wound. Now with 1/4 blood cultures growing GNR with BCID showing Proteus.   Name of physician (or Provider) Contacted: [place]   Current antibiotics: Cefepime + Vancomycin + Flagyl  Changes to prescribed antibiotics recommended:  Recommended narrowing to Rocephin 2g IV every 24 hours- resident to discuss with team and make decision on antibiotics  Results for orders placed or performed during the hospital encounter of 11/03/18  Blood Culture ID Panel (Reflexed) (Collected: 11/03/2018 12:58 PM)  Result Value Ref Range   Enterococcus species NOT DETECTED NOT DETECTED   Listeria monocytogenes NOT DETECTED NOT DETECTED   Staphylococcus species NOT DETECTED NOT DETECTED   Staphylococcus aureus (BCID) NOT DETECTED NOT DETECTED   Streptococcus species NOT DETECTED NOT DETECTED   Streptococcus agalactiae NOT DETECTED NOT DETECTED   Streptococcus pneumoniae NOT DETECTED NOT DETECTED   Streptococcus pyogenes NOT DETECTED NOT DETECTED   Acinetobacter baumannii NOT DETECTED NOT DETECTED   Enterobacteriaceae species DETECTED (A) NOT DETECTED   Enterobacter cloacae complex NOT DETECTED NOT DETECTED   Escherichia coli NOT DETECTED NOT DETECTED   Klebsiella oxytoca NOT DETECTED NOT DETECTED   Klebsiella pneumoniae NOT DETECTED NOT DETECTED   Proteus species DETECTED (A) NOT DETECTED   Serratia marcescens NOT DETECTED NOT DETECTED   Carbapenem resistance NOT DETECTED NOT DETECTED   Haemophilus influenzae NOT DETECTED NOT DETECTED   Neisseria meningitidis NOT DETECTED NOT DETECTED   Pseudomonas aeruginosa NOT DETECTED NOT DETECTED   Candida albicans NOT DETECTED NOT  DETECTED   Candida glabrata NOT DETECTED NOT DETECTED   Candida krusei NOT DETECTED NOT DETECTED   Candida parapsilosis NOT DETECTED NOT DETECTED   Candida tropicalis NOT DETECTED NOT DETECTED    Thank you for allowing pharmacy to be a part of this patient's care.  Jeremiah [name], PharmD, BCPS Clinical Pharmacist Please check AMION for all [facility] numbers 11/04/2018 8:02 PM

## 2018-11-05 ENCOUNTER — Inpatient Hospital Stay (HOSPITAL_COMMUNITY): Payer: Medicare Other

## 2018-11-05 DIAGNOSIS — Z9862 Peripheral vascular angioplasty status: Secondary | ICD-10-CM

## 2018-11-05 DIAGNOSIS — E8770 Fluid overload, unspecified: Secondary | ICD-10-CM

## 2018-11-05 DIAGNOSIS — Z77090 Contact with and (suspected) exposure to asbestos: Secondary | ICD-10-CM

## 2018-11-05 DIAGNOSIS — R918 Other nonspecific abnormal finding of lung field: Secondary | ICD-10-CM

## 2018-11-05 DIAGNOSIS — B9689 Other specified bacterial agents as the cause of diseases classified elsewhere: Secondary | ICD-10-CM

## 2018-11-05 DIAGNOSIS — Z87891 Personal history of nicotine dependence: Secondary | ICD-10-CM

## 2018-11-05 DIAGNOSIS — B964 Proteus (mirabilis) (morganii) as the cause of diseases classified elsewhere: Secondary | ICD-10-CM

## 2018-11-05 DIAGNOSIS — L089 Local infection of the skin and subcutaneous tissue, unspecified: Secondary | ICD-10-CM

## 2018-11-05 LAB — COMPREHENSIVE METABOLIC PANEL
ALT: 12 U/L (ref 0–44)
AST: 16 U/L (ref 15–41)
Albumin: 1.8 g/dL — ABNORMAL LOW (ref 3.5–5.0)
Alkaline Phosphatase: 88 U/L (ref 38–126)
Anion gap: 11 (ref 5–15)
BUN: 15 mg/dL (ref 8–23)
CO2: 22 mmol/L (ref 22–32)
Calcium: 8.3 mg/dL — ABNORMAL LOW (ref 8.9–10.3)
Chloride: 104 mmol/L (ref 98–111)
Creatinine, Ser: 1.31 mg/dL — ABNORMAL HIGH (ref 0.61–1.24)
GFR calc Af Amer: >60 mL/min (ref >60)
GFR calc non Af Amer: 53 mL/min — ABNORMAL LOW (ref >60)
Glucose, Bld: 112 mg/dL — ABNORMAL HIGH (ref 70–99)
Potassium: 3.3 mmol/L — ABNORMAL LOW (ref 3.5–5.1)
Sodium: 137 mmol/L (ref 135–145)
Total Bilirubin: 0.7 mg/dL (ref 0.3–1.2)
Total Protein: 6.2 g/dL — ABNORMAL LOW (ref 6.5–8.1)

## 2018-11-05 LAB — CBC
HCT: 22.6 % — ABNORMAL LOW (ref 39.0–52.0)
Hemoglobin: 7.1 g/dL — ABNORMAL LOW (ref 13.0–17.0)
MCH: 25.7 pg — ABNORMAL LOW (ref 26.0–34.0)
MCHC: 31.4 g/dL (ref 30.0–36.0)
MCV: 81.9 fL (ref 80.0–100.0)
Platelets: 258 10*3/uL (ref 150–400)
RBC: 2.76 MIL/uL — ABNORMAL LOW (ref 4.22–5.81)
RDW: 14.9 % (ref 11.5–15.5)
WBC: 13.3 10*3/uL — ABNORMAL HIGH (ref 4.0–10.5)
nRBC: 0 % (ref 0.0–0.2)

## 2018-11-05 LAB — LACTATE DEHYDROGENASE: LDH: 116 U/L (ref 98–192)

## 2018-11-05 LAB — GLUCOSE, CAPILLARY
Glucose-Capillary: 123 mg/dL — ABNORMAL HIGH (ref 70–99)
Glucose-Capillary: 162 mg/dL — ABNORMAL HIGH (ref 70–99)
Glucose-Capillary: 92 mg/dL (ref 70–99)
Glucose-Capillary: 182 mg/dL — ABNORMAL HIGH (ref 70–99)

## 2018-11-05 MED ORDER — FUROSEMIDE 10 MG/ML IJ SOLN
20.0000 mg | Freq: Once | INTRAMUSCULAR | Status: AC
  Administered 2018-11-05: 20 mg via INTRAVENOUS
  Filled 2018-11-05: qty 2

## 2018-11-05 NOTE — Progress Notes (Signed)
  Date: 11/05/2018  Patient name: Jeremiah [name]  Medical record number: [id number]  Date of birth: [birth year]   I have seen and evaluated this patient and I have discussed the plan of care with the house staff. Please see Dr. Marigene EhlersLee's note for complete details. I concur with his findings with the following additions/corrections:   Also with Acute normocytic anemia.  Baseline most recently appears to be around 8-10, fluctuating a lot during his recent acute illnesses.  Today his Hgb is 7.1.  Other cell lines are not significantly changed.  He has not had an INR.  He has not had iron studies from what I can see.  Will add on retic count, iron studies for the AM.  If he has any signs of acute blood loss (recent thoracentesis), SOB, signs of symptomatic anemia overnight, would recheck CBC and transfuse as needed.  Differential includes acute inflammatory process/sepsis which is ongoing suppressing bone marrow and phelobotomy vs. Acute blood loss.  Would monitor vitals closely.   Plan Iron studies, retic count for the morning.    Inez CatalinaMullen, Jeremiah Didion B, MD 11/05/2018, 3:38 PM

## 2018-11-05 NOTE — Progress Notes (Addendum)
Progress Note    11/05/2018 8:26 AM Hospital Day 2  Subjective:  Says he is feeling better everyday; wants to proceed with surgery to save his foot.  Tm 99.3 now afebrile HR 70's 130's-160's systolic 97% RA  Vitals:   11/04/18 2009 11/05/18 0418  BP: (!) 169/68 (!) 141/59  Pulse: 78 70  Resp: 18 18  Temp: 98.6 F (37 C) 98.1 F (36.7 C)  SpO2: 97% 98%    Physical Exam: General:  No distress Lungs:  Non labored Extremities:  Right lateral heel with gangrene and malodorous  CBC    Component Value Date/Time   WBC 13.3 (H) 11/05/2018 0711   RBC 2.76 (L) 11/05/2018 0711   HGB 7.1 (L) 11/05/2018 0711   HGB 8.9 (L) 10/26/2018 1215   HCT 22.6 (L) 11/05/2018 0711   HCT 27.2 (L) 10/26/2018 1215   PLT 258 11/05/2018 0711   PLT 264 10/26/2018 1215   MCV 81.9 11/05/2018 0711   MCV 80 10/26/2018 1215   MCH 25.7 (L) 11/05/2018 0711   MCHC 31.4 11/05/2018 0711   RDW 14.9 11/05/2018 0711   RDW 14.6 10/26/2018 1215   LYMPHSABS 1.2 11/03/2018 1300   LYMPHSABS 3.2 (H) 10/26/2018 1215   MONOABS 1.6 (H) 11/03/2018 1300   EOSABS 0.1 11/03/2018 1300   EOSABS 1.8 (H) 10/26/2018 1215   BASOSABS 0.0 11/03/2018 1300   BASOSABS 0.1 10/26/2018 1215    BMET    Component Value Date/Time   NA 138 11/04/2018 0418   NA 138 10/26/2018 1215   K 3.3 (L) 11/04/2018 0418   CL 104 11/04/2018 0418   CO2 25 11/04/2018 0418   GLUCOSE 107 (H) 11/04/2018 0418   BUN 14 11/04/2018 0418   BUN 15 10/26/2018 1215   CREATININE 1.40 (H) 11/04/2018 0418   CALCIUM 8.2 (L) 11/04/2018 0418   GFRNONAA 49 (L) 11/04/2018 0418   GFRAA 57 (L) 11/04/2018 0418    INR    Component Value Date/Time   INR 1.01 01/31/2018 0842     Intake/Output Summary (Last 24 hours) at 11/05/2018 0826 Last data filed at 11/05/2018 0524 Gross per 24 hour  Intake 1709.82 ml  Output -  Net 1709.82 ml     Assessment/Plan:  72 y.o. male in need of revascularization admitted for fevers Hospital Day 2  -blood cx  + for enterobacteriaceae - continue abx per primary team.  Pt continues to feel better.  -pt with right pleural effusion-underwent throacentesis yesterday and gram stain reveals abundant WBC predominantly mononuclear; no organisms seen. -pt wants to try bypass to save his foot, however, Dr. Randie Heinzain has spoken with him in detail that even with the surgery, he is at high risk of limb loss.  Plan for surgery on Tuesday. -hgb 7.1--transfuse per primary team. -leukocytosis improving   Doreatha MassedSamantha Rhyne, PA-C [facility] [phone] 11/05/2018 8:26 AM  Attempted to see patient today but could not given that he was in CT scanner.  Given his multitude of issues including positive blood cultures with pleural effusion and significant wound to his right heel that may require amputation regardless of revascularization status I will discuss with him that amputation may be his best bet given that he does not have vein and would need a prosthetic graft in the setting of positive blood cultures.  I will plan to see him again on Monday for this discussion.  He is tentatively plan for right femoral to popliteal artery bypass grafting on Tuesday.  Diahn Waidelich C.  Donzetta Matters, MD [facility] [place] Office: [phone] Pager: [phone]

## 2018-11-05 NOTE — Plan of Care (Signed)
  Problem: Clinical Measurements: Goal: Diagnostic test results will improve Outcome: Progressing   Problem: Clinical Measurements: Goal: Respiratory complications will improve Outcome: Progressing   Problem: Clinical Measurements: Goal: Cardiovascular complication will be avoided Outcome: Progressing   

## 2018-11-05 NOTE — Progress Notes (Signed)
Subjective:  Jeremiah [name] is a 72 y.o. with PMH of diastolic CHF, PVD, HTN, recurrent C.diff, Gout and T2DM admit for infected R foot ulcer on hospital day 2  Jeremiah [name] was examined and evaluated at bedside this PM. He states he was told that he may go to OR on Monday instead of Tuesday. Continuing to endorse foot pain but states improved compared to prior. Denies any cough, shortness of breath, fever, chills, nausea, vomiting after the thoracentesis yesterday. 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 He also mentions his prior smoking history w/ 24 pack years. Attempted to reach out to family together but wife and sons did not pick up. Will reconvene to discuss next steps later today.   Objective:  Vital signs in last 24 hours: Vitals:   11/04/18 2009 11/05/18 0300 11/05/18 0418 11/05/18 1208  BP: (!) 169/68  (!) 141/59 (!) 160/71  Pulse: 78  70 68  Resp: 18  18 19   Temp: 98.6 F (37 C)  98.1 F (36.7 C) 98.5 F (36.9 C)  TempSrc: Oral  Oral Oral  SpO2: 97%  98% 100%  Weight:  73 kg    Height:       Physical Exam  Constitutional: He is oriented to person, place, and time and well-developed, well-nourished, and in no distress. No distress.  HENT:  Head: Normocephalic and atraumatic.  Mouth/Throat: Oropharynx is clear and moist. No oropharyngeal exudate.  Eyes: Pupils are equal, round, and reactive to light. Conjunctivae and EOM are normal. No scleral icterus.  Neck: Normal range of motion. Neck supple. No JVD present.  Cardiovascular: Normal rate, regular rhythm, normal heart sounds and intact distal pulses.  Pulmonary/Chest: Effort normal. He has no wheezes. He has rales (bibasilar rales).  Thoracentesis needle site clean w/o surrounding erythema, exudates, warmth  Abdominal: Soft. Bowel sounds are normal. There is no abdominal tenderness.    Musculoskeletal: Normal range of motion.        General: Tenderness (R lateral heel ulcer with bloody, foul smelling exudate with tenderness to surrounding area on palpation) and edema (2+ pitting edmea mid shin bilaterally) present.  Neurological: He is alert and oriented to person, place, and time. No cranial nerve deficit. GCS score is 15.  Skin: Skin is warm and dry. He is not diaphoretic.   Assessment/Plan:  Active Problems:   Sepsis (HCC)   Fever   Open wound of right foot  Jeremiah [name] is a 72 yo M w/ PMH of diastolic CHF, PVD, HTN, recurrent C.Diff infection, Gout, and T2DM presenting with infected foot ulcer. He is in the process of discussing amputation vs bypass with vascular and is expected to go to OR early next week. Pleural effusion was shown to be exudative on analysis and follow up Chest CT showed a new Right middle lobe nodules. With his smoking and asbestos exposure history and exudative pleural effusion, likelihood of his nodule being malignancy is high. Will need full body imaging to look for metastasis.  Fever, tachypnea, altered mental status 2/2 infected diabetic/ischemic foot ulcer Afebrile overnight. WBC downtrending 16.5->14.6->13.3 BCID show proteus and enterobacter - Abx regimen changed to ceftriaxone per pharmacy - F/u blood culture sensitivities - Trend CBC - Appreciate vascular recs  R Pleural Effusion 700cc of serosanguinous pleural fluid removed yesterday via thoracentesis. LDH Pleura 176 Serum LDH 116 consistent with exudative effusion w/ 3550  WBC, No organism found.  Chest CT show RLL consolidation and 1.1x1.8cm RML nodule new since 06/2018. 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 - F/u cytology and culture - F/u Chest CT w/o contrast  Peripheral Vascular Disease Follows w/ Dr.Cain. LDL-C 55, HDL 27 11/01/18 angiogram: occluded left SFA as well as occluded L anterior tibial artery as well as occluded right SFA and  occluded R anterior tibial artery  - C/w home meds: simvastatin 40mg  daily, aspirin 81mg  daily  T2DM Hgb a1c 6.7. On glimepiride at home. Fasting bg 112 this AM. Received 2 unit novolog yesterday - Glucose checks - SSI  AKI 2/2 hypervolemia Creatine trend 1.36->1.4=>1.31. Baseline 1.06. Appear fluid overloaded on exam after receiving 2.25L NS in ED yesterday - IV furosemide 20mg  once - Trend BMP   HTN 141/90 this am - c/w home meds: metoprolol succinate 50mg  daily, losartan 100mg  daily  DVT prophx: subqheparin Diet: Diabetic Bowel: Senokot Code: Full  Dispo: Anticipated discharge in approximately 3 day(s).   [name], MD 11/05/2018, 2:27 PM Pager: [phone]

## 2018-11-05 NOTE — Care Management Important Message (Signed)
Important Message  Patient Details  Name: Jeremiah [name] MRN: [id number] Date of Birth: 08/08/1946   Medicare Important Message Given:  Yes    Oralia RudMegan P Earon Rivest 11/05/2018, 12:56 PM

## 2018-11-06 DIAGNOSIS — I509 Heart failure, unspecified: Secondary | ICD-10-CM

## 2018-11-06 DIAGNOSIS — I110 Hypertensive heart disease with heart failure: Secondary | ICD-10-CM

## 2018-11-06 DIAGNOSIS — A419 Sepsis, unspecified organism: Secondary | ICD-10-CM

## 2018-11-06 DIAGNOSIS — D649 Anemia, unspecified: Secondary | ICD-10-CM

## 2018-11-06 DIAGNOSIS — I96 Gangrene, not elsewhere classified: Secondary | ICD-10-CM

## 2018-11-06 DIAGNOSIS — J920 Pleural plaque with presence of asbestos: Secondary | ICD-10-CM

## 2018-11-06 DIAGNOSIS — Z8719 Personal history of other diseases of the digestive system: Secondary | ICD-10-CM

## 2018-11-06 DIAGNOSIS — J90 Pleural effusion, not elsewhere classified: Secondary | ICD-10-CM

## 2018-11-06 DIAGNOSIS — E1152 Type 2 diabetes mellitus with diabetic peripheral angiopathy with gangrene: Secondary | ICD-10-CM

## 2018-11-06 DIAGNOSIS — E876 Hypokalemia: Secondary | ICD-10-CM

## 2018-11-06 DIAGNOSIS — Z79899 Other long term (current) drug therapy: Secondary | ICD-10-CM

## 2018-11-06 LAB — CULTURE, BLOOD (ROUTINE X 2)

## 2018-11-06 LAB — GLUCOSE, CAPILLARY
Glucose-Capillary: 150 mg/dL — ABNORMAL HIGH (ref 70–99)
Glucose-Capillary: 181 mg/dL — ABNORMAL HIGH (ref 70–99)
Glucose-Capillary: 128 mg/dL — ABNORMAL HIGH (ref 70–99)
Glucose-Capillary: 129 mg/dL — ABNORMAL HIGH (ref 70–99)

## 2018-11-06 LAB — IRON AND TIBC
Iron: 12 ug/dL — ABNORMAL LOW (ref 45–182)
Saturation Ratios: 7 % — ABNORMAL LOW (ref 17.9–39.5)
TIBC: 161 ug/dL — ABNORMAL LOW (ref 250–450)
UIBC: 149 ug/dL

## 2018-11-06 LAB — CBC
HCT: 23.4 % — ABNORMAL LOW (ref 39.0–52.0)
Hemoglobin: 7.2 g/dL — ABNORMAL LOW (ref 13.0–17.0)
MCH: 25.2 pg — ABNORMAL LOW (ref 26.0–34.0)
MCHC: 30.8 g/dL (ref 30.0–36.0)
MCV: 81.8 fL (ref 80.0–100.0)
Platelets: 307 10*3/uL (ref 150–400)
RBC: 2.86 MIL/uL — ABNORMAL LOW (ref 4.22–5.81)
RDW: 14.8 % (ref 11.5–15.5)
WBC: 16.4 10*3/uL — ABNORMAL HIGH (ref 4.0–10.5)
nRBC: 0 % (ref 0.0–0.2)

## 2018-11-06 LAB — BASIC METABOLIC PANEL
Anion gap: 13 (ref 5–15)
BUN: 12 mg/dL (ref 8–23)
CO2: 20 mmol/L — ABNORMAL LOW (ref 22–32)
Calcium: 8.1 mg/dL — ABNORMAL LOW (ref 8.9–10.3)
Chloride: 101 mmol/L (ref 98–111)
Creatinine, Ser: 1.13 mg/dL (ref 0.61–1.24)
GFR calc Af Amer: >60 mL/min (ref >60)
GFR calc non Af Amer: >60 mL/min (ref >60)
Glucose, Bld: 205 mg/dL — ABNORMAL HIGH (ref 70–99)
Potassium: 3.2 mmol/L — ABNORMAL LOW (ref 3.5–5.1)
Sodium: 134 mmol/L — ABNORMAL LOW (ref 135–145)

## 2018-11-06 LAB — FERRITIN: Ferritin: 637 ng/mL — ABNORMAL HIGH (ref 24–336)

## 2018-11-06 LAB — RETICULOCYTES
Immature Retic Fract: 12.6 % (ref 2.3–15.9)
RBC.: 2.86 MIL/uL — ABNORMAL LOW (ref 4.22–5.81)
Retic Count, Absolute: 24.6 10*3/uL (ref 19.0–186.0)
Retic Ct Pct: 0.9 % (ref 0.4–3.1)

## 2018-11-06 MED ORDER — TIMOLOL MALEATE 0.5 % OP SOLN
1.0000 [drp] | Freq: Two times a day (BID) | OPHTHALMIC | Status: DC
  Administered 2018-11-06 – 2018-11-15 (×18): 1 [drp] via OPHTHALMIC
  Filled 2018-11-06: qty 5

## 2018-11-06 MED ORDER — FERROUS SULFATE 325 (65 FE) MG PO TABS: 325.0000 mg | ORAL_TABLET | Freq: Every day | ORAL | Status: DC

## 2018-11-06 MED ORDER — BRIMONIDINE TARTRATE 0.2 % OP SOLN
1.0000 [drp] | Freq: Two times a day (BID) | OPHTHALMIC | Status: DC
  Administered 2018-11-06 – 2018-11-15 (×18): 1 [drp] via OPHTHALMIC
  Filled 2018-11-06: qty 5

## 2018-11-06 MED ORDER — POTASSIUM CHLORIDE CRYS ER 20 MEQ PO TBCR
40.0000 meq | EXTENDED_RELEASE_TABLET | Freq: Two times a day (BID) | ORAL | Status: AC
  Administered 2018-11-06 – 2018-11-07 (×2): 40 meq via ORAL
  Filled 2018-11-06 (×2): qty 2

## 2018-11-06 NOTE — Progress Notes (Signed)
   Subjective:  [name] was seen resting in his bed this morning. He expressed understanding about his need for possible amputation.   Objective:  Vital signs in last 24 hours: Vitals:   11/05/18 1618 11/05/18 1951 11/06/18 0430 11/06/18 0804  BP: (!) 159/113 (!) 125/49 (!) 163/73 137/62  Pulse: 77 67 80 78  Resp:  18    Temp: 98.5 F (36.9 C) 98.8 F (37.1 C) 98.5 F (36.9 C)   TempSrc: Oral Oral Oral   SpO2: 100% 99% 100% 99%  Weight:   72.4 kg   Height:       Physical Exam  Constitutional: Appears well-developed and well-nourished. No distress.  HENT:  Head: Normocephalic and atraumatic.  Eyes: Conjunctivae are normal.  Cardiovascular: Normal rate, regular rhythm and normal heart sounds.  Respiratory: Effort normal and breath sounds normal. No respiratory distress. No wheezes.  GI: Soft. Bowel sounds are normal. No distension. There is no tenderness.  Musculoskeletal: No edema.  Neurological: Is alert.  Skin: Right foot wrapped Psychiatric: Normal mood and affect. Behavior is normal. Judgment and thought content normal.   Assessment/Plan:  [name] is a 72 year old male with hypertension, PVD, recurrent C. difficile infections, CHF who presented with acute encephalopathy.  He had sepsis secondary to gangrenous right foot heel wound.  Sepsis 2/2 gangrenous right foot heel wound The patient continues to be afebrile, but continues to have leukocysosis (wbc=16). Vascular surgery is following and recommend amputation and subsequent prosthetic graft.   -Vasc surgery to amputate likely next week.  Hypokalemia K=3.3 12/13, bmp today 12/14 pending.   Gram negative bacteremia Blood cultures positive for enterobacteriaceae.  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   -Will consult cardiothoracic surgery for non-urgent consult after primary problem resolved. May need pleural biopsy/ VATS procedure.  -Follow up cytology and culture   Normocytic anemia CBC 12/14 hb=7.2, hct=23.4. Iron studies fe=12, tibc=161, sat ratio=7, abs retic ct=24.6. Calculated RI=0.25 indicating a hypoproliferative state.   -Will hold off giving feso4 currently due to infectious process  Hypertension  The patient's blood pressure has ranged 120-160s/60-70s.  -Continue metoprolol succinate 50mg  qd -Continue losartan 100mg  qd  Diabetes Mellitus Type 2 Patient's blood glucose has ranged 90-180.   -Continue ssi  Dispo: Anticipated discharge in approximately 2-3 day(s).   Jeremiah [name], Jeremiah Dambrosio, MD 11/06/2018, 11:26 AM Pager: [phone]

## 2018-11-06 NOTE — Plan of Care (Signed)
  Problem: Activity: Goal: Risk for activity intolerance will decrease Outcome: Progressing   Problem: Coping: Goal: Level of anxiety will decrease Outcome: Progressing   

## 2018-11-06 NOTE — Progress Notes (Signed)
   VASCULAR SURGERY ASSESSMENT & PLAN:   GANGRENOUS RIGHT HEEL WOUND: Dr. Randie Heinzain will be back on Monday morning to discuss his options for the right lower extremity.  Given that he would require a prosthetic graft and given the extent of the right heel wound I believe that Dr. Randie Heinzain is leaning towards a primary amputation.  He will discuss this with the patient and his son on Monday.  ANEMIA: His hemoglobin is 7.2 today which is stable over the last 2 days.  SUBJECTIVE:   No specific complaints.  PHYSICAL EXAM:   Vitals:   11/05/18 1618 11/05/18 1951 11/06/18 0430 11/06/18 0804  BP: (!) 159/113 (!) 125/49 (!) 163/73 137/62  Pulse: 77 67 80 78  Resp:  18    Temp: 98.5 F (36.9 C) 98.8 F (37.1 C) 98.5 F (36.9 C)   TempSrc: Oral Oral Oral   SpO2: 100% 99% 100% 99%  Weight:   72.4 kg   Height:       Malodorous right heel wound.  LABS:   Lab Results  Component Value Date   WBC 16.4 (H) 11/06/2018   HGB 7.2 (L) 11/06/2018   HCT 23.4 (L) 11/06/2018   MCV 81.8 11/06/2018   PLT 307 11/06/2018   Lab Results  Component Value Date   CREATININE 1.31 (H) 11/05/2018   Lab Results  Component Value Date   INR 1.01 01/31/2018   CBG (last 3)  Recent Labs    11/05/18 1654 11/05/18 2105 11/06/18 0743  GLUCAP 92 182* 150*    PROBLEM LIST:    Active Problems:   Sepsis (HCC)   Fever   Open wound of right foot   CURRENT MEDS:   . aspirin  81 mg Oral Daily  . heparin injection (subcutaneous)  5,000 Units Subcutaneous Q8H  . insulin aspart  0-15 Units Subcutaneous TID WC  . losartan  100 mg Oral Daily  . metoprolol succinate  50 mg Oral Daily  . simvastatin  40 mg Oral Daily    Waverly FerrariChristopher Trisa Cranor Beeper: [phone] Office: [phone] 11/06/2018

## 2018-11-06 NOTE — Progress Notes (Addendum)
Internal Medicine Attending:   I saw and examined the patient. I reviewed the resident's note and I agree with the resident's findings and plan as documented in the resident's note.   Overall doing well no acute issues, inquiring about pleural fluid results.   Current plan is vasc surgery reeval on Monday, will likely need BKA of right foot and graft.   For his Gram negative bacerermia, stable doing well.  He was noted to have a chronic pleural effusion thoracentesis has revealed a exudative effusion.  CT reveales possible RLL mas but also pleural plaques consistent with prior asbestos exposure.  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  Will plan non urgent CVTS consult on Monday.  Will likely need pleural biopsy vs IR biopsy.

## 2018-11-07 LAB — BASIC METABOLIC PANEL
Anion gap: 13 (ref 5–15)
BUN: 10 mg/dL (ref 8–23)
CO2: 22 mmol/L (ref 22–32)
Calcium: 8.5 mg/dL — ABNORMAL LOW (ref 8.9–10.3)
Chloride: 100 mmol/L (ref 98–111)
Creatinine, Ser: 1.09 mg/dL (ref 0.61–1.24)
GFR calc Af Amer: >60 mL/min (ref >60)
GFR calc non Af Amer: >60 mL/min (ref >60)
Glucose, Bld: 134 mg/dL — ABNORMAL HIGH (ref 70–99)
Potassium: 3.6 mmol/L (ref 3.5–5.1)
Sodium: 135 mmol/L (ref 135–145)

## 2018-11-07 LAB — CULTURE, BLOOD (ROUTINE X 2)

## 2018-11-07 LAB — CBC
HCT: 24.5 % — ABNORMAL LOW (ref 39.0–52.0)
Hemoglobin: 7.7 g/dL — ABNORMAL LOW (ref 13.0–17.0)
MCH: 25.2 pg — ABNORMAL LOW (ref 26.0–34.0)
MCHC: 31.4 g/dL (ref 30.0–36.0)
MCV: 80.3 fL (ref 80.0–100.0)
Platelets: 307 10*3/uL (ref 150–400)
RBC: 3.05 MIL/uL — ABNORMAL LOW (ref 4.22–5.81)
RDW: 14.7 % (ref 11.5–15.5)
WBC: 15.5 10*3/uL — ABNORMAL HIGH (ref 4.0–10.5)
nRBC: 0 % (ref 0.0–0.2)

## 2018-11-07 LAB — GLUCOSE, CAPILLARY: Glucose-Capillary: 134 mg/dL — ABNORMAL HIGH (ref 70–99)

## 2018-11-07 MED ORDER — SODIUM CHLORIDE 0.9 % IV SOLN
2.0000 g | Freq: Four times a day (QID) | INTRAVENOUS | Status: DC
  Administered 2018-11-07 – 2018-11-11 (×16): 2 g via INTRAVENOUS
  Filled 2018-11-07 (×18): qty 2000

## 2018-11-07 MED ORDER — SODIUM CHLORIDE 0.9 % IV SOLN
2.0000 g | Freq: Three times a day (TID) | INTRAVENOUS | Status: DC
  Filled 2018-11-07: qty 2000

## 2018-11-07 NOTE — Progress Notes (Signed)
   VASCULAR SURGERY ASSESSMENT & PLAN:   I discussed the situation this morning with the patient and his wife on the phone.  I have explained that he does not have vein in the right leg for up vein bypass and would require a prosthetic graft.  In addition he has an extensive wound on the right heel.  Even with successful revascularization there is significant chance of this not healing.  The options are either to attempt bypass despite the increased risk including the risk of graft infection and limb loss versus considering primary amputation.  Dr. Lemar LivingsBrandon Cain will be back tomorrow morning to discuss with the patient.  His surgery is tentatively scheduled for Tuesday.  SUBJECTIVE:   No specific complaints.  PHYSICAL EXAM:   Vitals:   11/07/18 0010 11/07/18 0530 11/07/18 0531 11/07/18 0901  BP:   (!) 171/59 (!) 155/72  Pulse:   65 67  Resp:      Temp: 98.9 F (37.2 C)  98.2 F (36.8 C)   TempSrc: Oral  Oral   SpO2:   99% 100%  Weight:  70.8 kg    Height:       No change in right heel wound which is malodorous.  LABS:   Lab Results  Component Value Date   WBC 16.4 (H) 11/06/2018   HGB 7.2 (L) 11/06/2018   HCT 23.4 (L) 11/06/2018   MCV 81.8 11/06/2018   PLT 307 11/06/2018   Lab Results  Component Value Date   CREATININE 1.09 11/07/2018   CBG (last 3)  Recent Labs    11/06/18 1643 11/06/18 2051 11/07/18 0808  GLUCAP 128* 129* 134*    PROBLEM LIST:    Active Problems:   Sepsis (HCC)   Fever   Open wound of right foot   CURRENT MEDS:   . aspirin  81 mg Oral Daily  . brimonidine  1 drop Both Eyes BID  . heparin injection (subcutaneous)  5,000 Units Subcutaneous Q8H  . insulin aspart  0-15 Units Subcutaneous TID WC  . losartan  100 mg Oral Daily  . metoprolol succinate  50 mg Oral Daily  . simvastatin  40 mg Oral Daily  . timolol  1 drop Both Eyes BID    Waverly FerrariChristopher Carmeline Kowal Beeper: [phone] Office: [phone] 11/07/2018

## 2018-11-07 NOTE — Progress Notes (Signed)
Subjective:  Jeremiah [name] is a 72 y.o. with PMH of diastolic CHF, PVD, HTN, recurrent C.diff, Gout and T2DM admit for infected R foot ulcer on hospital day 4  Jeremiah [name] was examined and evaluated at bedside this PM with his son present. He states that he would like to know if an amputation would get rid of his foot pain. Explained to him that although he may have some post-operative pain, amputation would most definitely decrease the severity of the ischemic pain he is currently endorsing. He states he is now more open to the idea of amputation. His son states he is planning to take the day off tomorrow and would like to speak with the surgeon before he goes into the OR. He had an elevated temp at 101 last night but states he does not recall. Denies any chills/nausea/vomiting/diarrhea/constipation.  Objective:  Vital signs in last 24 hours: Vitals:   11/07/18 0530 11/07/18 0531 11/07/18 0901 11/07/18 1208  BP:  (!) 171/59 (!) 155/72 (!) 174/72  Pulse:  65 67 67  Resp:    16  Temp:  98.2 F (36.8 C)  99.1 F (37.3 C)  TempSrc:  Oral  Oral  SpO2:  99% 100% 100%  Weight: 70.8 kg     Height:       Physical Exam  Constitutional: He is oriented to person, place, and time and well-developed, well-nourished, and in no distress. No distress.  HENT:  Head: Normocephalic and atraumatic.  Mouth/Throat: Oropharynx is clear and moist. No oropharyngeal exudate.  Eyes: Pupils are equal, round, and reactive to light. Conjunctivae and EOM are normal. No scleral icterus.  Neck: Normal range of motion. Neck supple. No JVD present.  Cardiovascular: Normal rate, regular rhythm, normal heart sounds and intact distal pulses.  Pulmonary/Chest: Effort normal. He has no wheezes. He has rales (bibasilar rales).  Thoracentesis needle site clean w/o surrounding erythema, exudates, warmth  Abdominal: Soft. Bowel sounds are normal. There is no abdominal tenderness.  Musculoskeletal: Normal range of motion.         General: Tenderness (R lateral heel ulcer with bloody, foul smelling exudate with tenderness to surrounding area on palpation) and edema (2+ pitting edmea mid shin bilaterally) present.  Neurological: He is alert and oriented to person, place, and time. No cranial nerve deficit. GCS score is 15.  Skin: Skin is warm and dry. He is not diaphoretic.   Assessment/Plan:  Active Problems:   Sepsis (HCC)   Fever   Open wound of right foot  Jeremiah [name] is a 72 yo M w/ PMH of diastolic CHF, PVD, HTN, recurrent C.Diff infection, Gout, and T2DM presenting with sepsis 2/2 infected R foot. He is having elevated temperature and his leukocytosis is not resolving despite being on correct antibiotic regimen due to delayed source control. Dr.Cain will discuss with him about amputation on 11/08/18 and go to OR on 11/09/18. His blood culture is growing pan-sensitive proteus mirabilis and his abx regimen will be further narrowed with pharmacy recommendations.  Sepsis 2/2 infected foot ulcer Temp peak of 101F overnight. WBC stable at 15. Blood culture growing pan-sensitive proteus mirabilis.  - Abx regimen changed to ampicillin IV (Need to continue until source control) - Trend CBC - Appreciate vascular recs: Plan for discussion about surgery goal (amputation vs bypass) on 11/08/18  R Pleural Effusion w/ underlying RML nodule Exudative pleural effusion (LDH pleura / serum LDH > 0.6) with mostly leukocytes. No organism found on gram stain and culture NGTD. Underlying  1.1x1.8cm RML nodule found after thoracentesis - F/u cytology - Cardiothoracic surgery consult on 11/08/18  Peripheral Vascular Disease Follows w/ Dr.Cain. LDL-C 55, HDL 27 11/01/18 angiogram: occluded left SFA as well as occluded L anterior tibial artery as well as occluded right SFA and occluded R anterior tibial artery  - C/w home meds: simvastatin 40mg  daily, aspirin 81mg  daily  T2DM Hgb a1c 6.7. On glimepiride at home. Fasting bg 134 this  AM. Received 7 unit novolog yesterday - Hold insulin on day of OR (11/09/18) - Glucose checks - SSI  AKI 2/2 hypervolemia Creatine back to baseline at 1.09 today Baseline 1.0. - Resolved  HTN 155/72 this am - c/w home meds: metoprolol succinate 50mg  daily, losartan 100mg  daily  DVT prophx: subqheparin Diet: Diabetic Bowel: Senokot Code: Full  Dispo: Anticipated discharge in approximately 3 day(s).   Jeremiah [name], Jeremiah Geddis K, MD 11/07/2018, 12:18 PM Pager: [phone]

## 2018-11-07 NOTE — Plan of Care (Signed)
  Problem: Elimination: Goal: Will not experience complications related to bowel motility Outcome: Progressing   Problem: Safety: Goal: Ability to remain free from injury will improve Outcome: Progressing   Problem: Activity: Goal: Risk for activity intolerance will decrease Outcome: Not Progressing  weakness

## 2018-11-07 NOTE — Plan of Care (Signed)
  Problem: Education: Goal: Knowledge of General Education information will improve Description: Including pain rating scale, medication(s)/side effects and non-pharmacologic comfort measures Outcome: Progressing   Problem: Health Behavior/Discharge Planning: Goal: Ability to manage health-related needs will improve Outcome: Progressing   Problem: Clinical Measurements: Goal: Will remain free from infection Outcome: Progressing   

## 2018-11-08 DIAGNOSIS — J90 Pleural effusion, not elsewhere classified: Secondary | ICD-10-CM

## 2018-11-08 DIAGNOSIS — R911 Solitary pulmonary nodule: Secondary | ICD-10-CM

## 2018-11-08 LAB — BASIC METABOLIC PANEL
Anion gap: 11 (ref 5–15)
BUN: 9 mg/dL (ref 8–23)
CO2: 23 mmol/L (ref 22–32)
Calcium: 8.3 mg/dL — ABNORMAL LOW (ref 8.9–10.3)
Chloride: 100 mmol/L (ref 98–111)
Creatinine, Ser: 1.07 mg/dL (ref 0.61–1.24)
GFR calc Af Amer: >60 mL/min (ref >60)
GFR calc non Af Amer: >60 mL/min (ref >60)
Glucose, Bld: 121 mg/dL — ABNORMAL HIGH (ref 70–99)
Potassium: 3.5 mmol/L (ref 3.5–5.1)
Sodium: 134 mmol/L — ABNORMAL LOW (ref 135–145)

## 2018-11-08 LAB — GLUCOSE, CAPILLARY
Glucose-Capillary: 149 mg/dL — ABNORMAL HIGH (ref 70–99)
Glucose-Capillary: 134 mg/dL — ABNORMAL HIGH (ref 70–99)
Glucose-Capillary: 100 mg/dL — ABNORMAL HIGH (ref 70–99)
Glucose-Capillary: 122 mg/dL — ABNORMAL HIGH (ref 70–99)
Glucose-Capillary: 114 mg/dL — ABNORMAL HIGH (ref 70–99)
Glucose-Capillary: 181 mg/dL — ABNORMAL HIGH (ref 70–99)
Glucose-Capillary: 129 mg/dL — ABNORMAL HIGH (ref 70–99)

## 2018-11-08 LAB — CBC
HCT: 23.5 % — ABNORMAL LOW (ref 39.0–52.0)
HCT: 23.4 % — ABNORMAL LOW (ref 39.0–52.0)
Hemoglobin: 7.2 g/dL — ABNORMAL LOW (ref 13.0–17.0)
Hemoglobin: 7.4 g/dL — ABNORMAL LOW (ref 13.0–17.0)
MCH: 24.8 pg — ABNORMAL LOW (ref 26.0–34.0)
MCH: 25.6 pg — ABNORMAL LOW (ref 26.0–34.0)
MCHC: 30.6 g/dL (ref 30.0–36.0)
MCHC: 31.6 g/dL (ref 30.0–36.0)
MCV: 81 fL (ref 80.0–100.0)
MCV: 81 fL (ref 80.0–100.0)
Platelets: 357 10*3/uL (ref 150–400)
Platelets: 345 10*3/uL (ref 150–400)
RBC: 2.9 MIL/uL — ABNORMAL LOW (ref 4.22–5.81)
RBC: 2.89 MIL/uL — ABNORMAL LOW (ref 4.22–5.81)
RDW: 14.7 % (ref 11.5–15.5)
RDW: 14.7 % (ref 11.5–15.5)
WBC: 15.6 10*3/uL — ABNORMAL HIGH (ref 4.0–10.5)
WBC: 18 10*3/uL — ABNORMAL HIGH (ref 4.0–10.5)
nRBC: 0 % (ref 0.0–0.2)
nRBC: 0 % (ref 0.0–0.2)

## 2018-11-08 LAB — COMPREHENSIVE METABOLIC PANEL
ALT: 10 U/L (ref 0–44)
AST: 15 U/L (ref 15–41)
Albumin: 1.7 g/dL — ABNORMAL LOW (ref 3.5–5.0)
Alkaline Phosphatase: 98 U/L (ref 38–126)
Anion gap: 12 (ref 5–15)
BUN: 10 mg/dL (ref 8–23)
CO2: 22 mmol/L (ref 22–32)
Calcium: 8.2 mg/dL — ABNORMAL LOW (ref 8.9–10.3)
Chloride: 99 mmol/L (ref 98–111)
Creatinine, Ser: 1.2 mg/dL (ref 0.61–1.24)
GFR calc Af Amer: >60 mL/min (ref >60)
GFR calc non Af Amer: 59 mL/min — ABNORMAL LOW (ref >60)
Glucose, Bld: 167 mg/dL — ABNORMAL HIGH (ref 70–99)
Potassium: 3.6 mmol/L (ref 3.5–5.1)
Sodium: 133 mmol/L — ABNORMAL LOW (ref 135–145)
Total Bilirubin: 0.6 mg/dL (ref 0.3–1.2)
Total Protein: 6.6 g/dL (ref 6.5–8.1)

## 2018-11-08 LAB — URINALYSIS, ROUTINE W REFLEX MICROSCOPIC
Bacteria, UA: NONE SEEN
Bilirubin Urine: NEGATIVE
Glucose, UA: NEGATIVE mg/dL
Hgb urine dipstick: NEGATIVE
Ketones, ur: NEGATIVE mg/dL
Leukocytes, UA: NEGATIVE
Nitrite: NEGATIVE
Protein, ur: 100 mg/dL — AB
Specific Gravity, Urine: 1.018 (ref 1.005–1.030)
pH: 5 (ref 5.0–8.0)

## 2018-11-08 LAB — SURGICAL PCR SCREEN
MRSA, PCR: NEGATIVE
Staphylococcus aureus: NEGATIVE

## 2018-11-08 LAB — APTT: aPTT: 40 seconds — ABNORMAL HIGH (ref 24–36)

## 2018-11-08 LAB — BODY FLUID CULTURE
Culture: NO GROWTH
Special Requests: NORMAL

## 2018-11-08 LAB — ABO/RH: ABO/RH(D): B POS

## 2018-11-08 LAB — PROTIME-INR
INR: 1.2
Prothrombin Time: 15.1 seconds (ref 11.4–15.2)

## 2018-11-08 MED ORDER — CHLORHEXIDINE GLUCONATE CLOTH 2 % EX PADS: 6.0000 | MEDICATED_PAD | Freq: Once | CUTANEOUS | Status: DC

## 2018-11-08 NOTE — Progress Notes (Signed)
Subjective:  Jeremiah [name] is a 72 y.o. with PMH of diastolic CHF, PVD, HTN, recurrent C.diff, Gout and T2DM admit for infected R foot ulcer on hospital day 5  Jeremiah [name] was examined and evaluated at bedside this PM with his son present. He states he feels fine and he is continuing to endorse foot pain and is looking forward to getting his procedure done tomorrow. States pain is manageable but continues to very bothersome. He mentions no cough, no productive sputum, no wheezing, no shortness of breath. He denies any fevers/chills/ nausea/ vomiting /diarrhea / constipation.  Objective:  Vital signs in last 24 hours: Vitals:   11/07/18 1208 11/07/18 1942 11/08/18 0531 11/08/18 1136  BP: (!) 174/72 (!) 109/92 (!) 149/59 136/65  Pulse: 67 72 75 (!) 59  Resp: 16 20 20 18   Temp: 99.1 F (37.3 C) (!) 100.6 F (38.1 C) 98.6 F (37 C) 98.4 F (36.9 C)  TempSrc: Oral Oral Oral Oral  SpO2: 100% 100% 100% 100%  Weight:   72.9 kg   Height:       Physical Exam  Constitutional: He is oriented to person, place, and time and well-developed, well-nourished, and in no distress. No distress.  HENT:  Head: Normocephalic and atraumatic.  Mouth/Throat: Oropharynx is clear and moist. No oropharyngeal exudate.  Eyes: Pupils are equal, round, and reactive to light. Conjunctivae and EOM are normal. No scleral icterus.  Neck: Normal range of motion. Neck supple. No JVD present.  Cardiovascular: Normal rate, regular rhythm and intact distal pulses.  Murmur (3/6 systolic murmur loudest at RSB) heard. Pulmonary/Chest: Effort normal. He has no wheezes. He has rales (basilar rales R>L).  Thoracentesis needle site clean w/o surrounding erythema, exudates, warmth  Abdominal: Soft. Bowel sounds are normal. There is no abdominal tenderness.  Musculoskeletal: Normal range of motion.        General: Tenderness (R lateral foot covered in bandaging. Did not remove to inspect) present.  Neurological: He is alert  and oriented to person, place, and time. No cranial nerve deficit. GCS score is 15.  Skin: Skin is warm and dry. He is not diaphoretic.   Assessment/Plan:  Active Problems:   Sepsis (HCC)   Fever   Open wound of right foot  Jeremiah [name] is a 72 yo M w/ PMH of diastolic CHF, PVD, HTN, recurrent C.Diff infection, Gout, and T2DM presenting with sepsis 2/2 infected R foot. He had another episode of elevated temp at 100.68F despite being on appropriate antibiotic due to delayed source control per patient wishes. After extensive discussion with vascular surgery, he is ready for amputation and willing to proceed with AKA tomorrow. With good source control, his sepsis should respond more appropriately to abx therapy.  Sepsis 2/2 infected foot ulcer Temp peak of 100.68F overnight. Wbc 15.6 this AM. BC confirm pan-sensitive proteus mirabilis.  - c/w ampicillin 2g IV - Trend CBC - Appreciate vascular recs: Plan for OR 11/08/18 for AKA  R Pleural Effusion w/ underlying RML nodule Exudative pleural effusion (LDH pleura / serum LDH > 0.6) with mostly leukocytes. No organism found on gram stain and culture NGTD. Underlying 1.1x1.8cm RML nodule found after thoracentesis - F/u cytology - Appreciate Cardiothoracic surgery recs  Peripheral Vascular Disease Follows w/ Jeremiah [name]. LDL-C 55, HDL 27 11/01/18 angiogram: occluded left SFA as well as occluded L anterior tibial artery as well as occluded right SFA and occluded R anterior tibial artery  - C/w home meds: simvastatin 40mg  daily, aspirin 81mg  daily  T2DM Hgb a1c 6.7. Fasting bg 122 this am. 6 units of aspart given yesterday - Hold insulin starting tonight for OR (11/09/18) - Glucose checks - SSI  HTN 149/59 this am - c/w home meds: metoprolol succinate 50mg  daily, losartan 100mg  daily  DVT prophx: subqheparin Diet: Diabetic Bowel: Senokot Code: Full  Dispo: Anticipated discharge in approximately 2-4 day(s).   Jeremiah Barrio, MD 11/08/2018,  2:35 PM Pager: [phone]

## 2018-11-08 NOTE — Anesthesia Preprocedure Evaluation (Addendum)
Anesthesia Evaluation  Patient identified by MRN, date of birth, ID band Patient awake    Reviewed: Allergy & Precautions, NPO status , Patient's Chart, lab work & pertinent test results, reviewed documented beta blocker date and time   Airway Mallampati: I  TM Distance: >3 FB Neck ROM: Full    Dental  (+) Edentulous Upper, Partial Lower, Dental Advisory Given, Missing   Pulmonary sleep apnea (doesn't use his CPAP) , former smoker (quit 1985),    breath sounds clear to auscultation       Cardiovascular hypertension, Pt. on medications and Pt. on home beta blockers (-) angina+ Peripheral Vascular Disease   Rhythm:Regular Rate:Normal + Systolic murmurs 1/615/19 ECHO: EF 50-55%, mild AS, mild MR 5/19 stress: small inferior defect with mild peri-infarct ischemia, EF 51%   Neuro/Psych negative neurological ROS     GI/Hepatic negative GI ROS, Neg liver ROS,   Endo/Other  diabetes, Oral Hypoglycemic Agents  Renal/GU Renal InsufficiencyRenal disease     Musculoskeletal  (+) Arthritis ,   Abdominal   Peds  Hematology  (+) Blood dyscrasia (Hb 7.4), anemia ,   Anesthesia Other Findings   Reproductive/Obstetrics                            Anesthesia Physical Anesthesia Plan  ASA: III  Anesthesia Plan: General   Post-op Pain Management:    Induction: Intravenous  PONV Risk Score and Plan: 3 and Ondansetron, Dexamethasone and Treatment may vary due to age or medical condition  Airway Management Planned: LMA  Additional Equipment:   Intra-op Plan:   Post-operative Plan:   Informed Consent: I have reviewed the patients History and Physical, chart, labs and discussed the procedure including the risks, benefits and alternatives for the proposed anesthesia with the patient or authorized representative who has indicated his/her understanding and acceptance.   Dental advisory given  Plan Discussed  with: CRNA and Surgeon  Anesthesia Plan Comments: (Plan routine monitors, GA- LMA OK)        Anesthesia Quick Evaluation

## 2018-11-08 NOTE — Progress Notes (Signed)
  Progress Note    11/08/2018 11:22 AM * No surgery date entered *  Subjective:  Still having pain in right foot  Vitals:   11/07/18 1942 11/08/18 0531  BP: (!) 109/92 (!) 149/59  Pulse: 72 75  Resp: 20 20  Temp: (!) 100.6 F (38.1 C) 98.6 F (37 C)  SpO2: 100% 100%    Physical Exam: aaox3 Non labored respirations Right foot dressing cdi  CBC    Component Value Date/Time   WBC 18.0 (H) 11/08/2018 0934   RBC 2.89 (L) 11/08/2018 0934   HGB 7.4 (L) 11/08/2018 0934   HGB 8.9 (L) 10/26/2018 1215   HCT 23.4 (L) 11/08/2018 0934   HCT 27.2 (L) 10/26/2018 1215   PLT 345 11/08/2018 0934   PLT 264 10/26/2018 1215   MCV 81.0 11/08/2018 0934   MCV 80 10/26/2018 1215   MCH 25.6 (L) 11/08/2018 0934   MCHC 31.6 11/08/2018 0934   RDW 14.7 11/08/2018 0934   RDW 14.6 10/26/2018 1215   LYMPHSABS 1.2 11/03/2018 1300   LYMPHSABS 3.2 (H) 10/26/2018 1215   MONOABS 1.6 (H) 11/03/2018 1300   EOSABS 0.1 11/03/2018 1300   EOSABS 1.8 (H) 10/26/2018 1215   BASOSABS 0.0 11/03/2018 1300   BASOSABS 0.1 10/26/2018 1215    BMET    Component Value Date/Time   NA 133 (L) 11/08/2018 0934   NA 138 10/26/2018 1215   K 3.6 11/08/2018 0934   CL 99 11/08/2018 0934   CO2 22 11/08/2018 0934   GLUCOSE 167 (H) 11/08/2018 0934   BUN 10 11/08/2018 0934   BUN 15 10/26/2018 1215   CREATININE 1.20 11/08/2018 0934   CALCIUM 8.2 (L) 11/08/2018 0934   GFRNONAA 59 (L) 11/08/2018 0934   GFRAA >60 11/08/2018 0934    INR    Component Value Date/Time   INR 1.20 11/08/2018 0934     Intake/Output Summary (Last 24 hours) at 11/08/2018 1122 Last data filed at 11/08/2018 0937 Gross per 24 hour  Intake 841.47 ml  Output 675 ml  Net 166.47 ml     Assessment:  72 y.o. male is s/p here with right foot infection Plan: Discussed options to include bypass vs primary above knee amputation. Along with his son he is in agreement to proceed with right aka tomorrow. Npo past midnight.    Brandon C.  Randie Heinzain, MD [facility] Office: [phone] Pager: [phone]  11/08/2018 11:22 AM

## 2018-11-08 NOTE — Progress Notes (Signed)
Patient and Patients family refused ABG.  I explained why we needed the ABG and they still refused

## 2018-11-08 NOTE — Clinical Social Work Note (Signed)
Clinical Social Work Assessment  Patient Details  Name: Jeremiah [name] MRN: [id number] Date of Birth: 14-Nov-1946  Date of referral:  11/08/18               Reason for consult:  Facility Placement, Discharge Planning                Permission sought to share information with:  Facility Sport and exercise psychologist, Family Supports Permission granted to share information::  Yes, Verbal Permission Granted  Name::     Davonte Siebenaler  Agency::  SNF's  Relationship::  Wife  Contact Information:  [phone]  Housing/Transportation Living arrangements for the past 2 months:  Single Family Home Source of Information:  Patient, Medical Team, Spouse Patient Interpreter Needed:  None Criminal Activity/Legal Involvement Pertinent to Current Situation/Hospitalization:  No - Comment as needed Significant Relationships:  Adult Children, Spouse, Siblings Lives with:  Spouse Do you feel safe going back to the place where you live?  Yes Need for family participation in patient care:  Yes (Comment)  Care giving concerns:  PT recommending SNF once medically stable for discharge.   Social Worker assessment / plan:  CSW met with patient. Wife at bedside. CSW introduced role and explained that PT recommendations would be discussed. Patient and his wife are agreeable to SNF placement. [facility] is first preference because his sister-in-law had been there in the past. Will wait to send referral until therapy has assessed him after surgery tomorrow. No further concerns. CSW encouraged patient and his wife to contact CSW as needed. CSW will continue to follow patient and his wife for support and facilitate discharge to SNF once medically stable.  Employment status:  Retired Nurse, adult PT Recommendations:  [place] / Referral to community resources:  [place]  Patient/Family's Response to care:  Patient and his wife agreeable  to SNF. Patient's family supportive and involved in patient's care. Patient and his wife appreciated social work intervention.  Patient/Family's Understanding of and Emotional Response to Diagnosis, Current Treatment, and Prognosis:  Patient and his wife have a good understanding of the reason for admission and his need for rehab prior to returning home. Patient and his wife appear happy with hospital care.  Emotional Assessment Appearance:  Appears stated age Attitude/Demeanor/Rapport:  Engaged, Gracious Affect (typically observed):  Accepting, Appropriate, Calm, Pleasant Orientation:  Oriented to Self, Oriented to Place, Oriented to Situation, Oriented to  Time Alcohol / Substance use:  Never Used Psych involvement (Current and /or in the community):  No (Comment)  Discharge Needs  Concerns to be addressed:  Care Coordination Readmission within the last 30 days:  Yes Current discharge risk:  Dependent with Mobility Barriers to Discharge:  Continued Medical Work up, [place], LCSW 11/08/2018, 1:17 PM

## 2018-11-08 NOTE — Progress Notes (Signed)
  Date: 11/08/2018  Patient name: Jeremiah SpiceWalter L Juhasz  Medical record number: [id number]  Date of birth: 12-29-1945   I have seen and evaluated this patient and I have discussed the plan of care with the house staff. Please see Dr. Marigene EhlersLee's note for complete details. I concur with his findings.  Inez CatalinaMullen, Olamide Carattini B, MD 11/08/2018, 3:08 PM

## 2018-11-09 ENCOUNTER — Inpatient Hospital Stay (HOSPITAL_COMMUNITY): Payer: Medicare Other | Admitting: Certified Registered"

## 2018-11-09 ENCOUNTER — Inpatient Hospital Stay (HOSPITAL_COMMUNITY): Admission: RE | Admit: 2018-11-09 | Payer: Medicare Other | Source: Home / Self Care | Admitting: Vascular Surgery

## 2018-11-09 ENCOUNTER — Encounter (HOSPITAL_COMMUNITY)
Admission: EM | Disposition: A | Discharge status: Rehabilitation Facility | Payer: Self-pay | Source: Home / Self Care | Attending: Internal Medicine

## 2018-11-09 ENCOUNTER — Telehealth: Payer: Self-pay | Admitting: Vascular Surgery

## 2018-11-09 DIAGNOSIS — D509 Iron deficiency anemia, unspecified: Secondary | ICD-10-CM

## 2018-11-09 HISTORY — PX: AMPUTATION: SHX166

## 2018-11-09 LAB — CBC
HCT: 29.2 % — ABNORMAL LOW (ref 39.0–52.0)
Hemoglobin: 9.7 g/dL — ABNORMAL LOW (ref 13.0–17.0)
MCH: 26.8 pg (ref 26.0–34.0)
MCHC: 33.2 g/dL (ref 30.0–36.0)
MCV: 80.7 fL (ref 80.0–100.0)
Platelets: 345 10*3/uL (ref 150–400)
RBC: 3.62 MIL/uL — ABNORMAL LOW (ref 4.22–5.81)
RDW: 13.8 % (ref 11.5–15.5)
WBC: 20.5 10*3/uL — ABNORMAL HIGH (ref 4.0–10.5)
nRBC: 0 % (ref 0.0–0.2)

## 2018-11-09 LAB — GLUCOSE, CAPILLARY
Glucose-Capillary: 157 mg/dL — ABNORMAL HIGH (ref 70–99)
Glucose-Capillary: 151 mg/dL — ABNORMAL HIGH (ref 70–99)
Glucose-Capillary: 212 mg/dL — ABNORMAL HIGH (ref 70–99)

## 2018-11-09 LAB — PREPARE RBC (CROSSMATCH)

## 2018-11-09 SURGERY — AMPUTATION, ABOVE KNEE
Anesthesia: General | Site: Leg Upper | Laterality: Right

## 2018-11-09 MED ORDER — HEPARIN SODIUM (PORCINE) 5000 UNIT/ML IJ SOLN
5000.0000 [IU] | Freq: Three times a day (TID) | INTRAMUSCULAR | Status: DC
  Administered 2018-11-09 – 2018-11-15 (×17): 5000 [IU] via SUBCUTANEOUS
  Filled 2018-11-09 (×14): qty 1

## 2018-11-09 MED ORDER — ONDANSETRON HCL 4 MG/2ML IJ SOLN
INTRAMUSCULAR | Status: DC | PRN
  Administered 2018-11-09: 4 mg via INTRAVENOUS

## 2018-11-09 MED ORDER — OXYCODONE-ACETAMINOPHEN 5-325 MG PO TABS
1.0000 | ORAL_TABLET | ORAL | Status: DC | PRN
  Administered 2018-11-09 (×2): 2 via ORAL
  Administered 2018-11-09: 1 via ORAL
  Administered 2018-11-10 – 2018-11-13 (×4): 2 via ORAL
  Filled 2018-11-09 (×6): qty 2
  Filled 2018-11-09: qty 1

## 2018-11-09 MED ORDER — FENTANYL CITRATE (PF) 100 MCG/2ML IJ SOLN
INTRAMUSCULAR | Status: DC | PRN
  Administered 2018-11-09: 25 ug via INTRAVENOUS
  Administered 2018-11-09: 50 ug via INTRAVENOUS
  Administered 2018-11-09: 25 ug via INTRAVENOUS

## 2018-11-09 MED ORDER — PROPOFOL 10 MG/ML IV BOLUS
INTRAVENOUS | Status: DC | PRN
  Administered 2018-11-09: 120 mg via INTRAVENOUS

## 2018-11-09 MED ORDER — SODIUM CHLORIDE 0.9 % IV SOLN
INTRAVENOUS | Status: DC
  Administered 2018-11-09 – 2018-11-13 (×6): via INTRAVENOUS

## 2018-11-09 MED ORDER — LACTATED RINGERS IV SOLN
INTRAVENOUS | Status: DC | PRN
  Administered 2018-11-09: 08:00:00 via INTRAVENOUS

## 2018-11-09 MED ORDER — MORPHINE SULFATE (PF) 2 MG/ML IV SOLN
2.0000 mg | INTRAVENOUS | Status: DC | PRN
  Administered 2018-11-12: 2 mg via INTRAVENOUS
  Filled 2018-11-09: qty 1

## 2018-11-09 MED ORDER — SODIUM CHLORIDE 0.9% IV SOLUTION: Freq: Once | INTRAVENOUS | Status: DC

## 2018-11-09 MED ORDER — PHENYLEPHRINE 40 MCG/ML (10ML) SYRINGE FOR IV PUSH (FOR BLOOD PRESSURE SUPPORT)
PREFILLED_SYRINGE | INTRAVENOUS | Status: DC | PRN
  Administered 2018-11-09: 80 ug via INTRAVENOUS
  Administered 2018-11-09: 120 ug via INTRAVENOUS
  Administered 2018-11-09: 80 ug via INTRAVENOUS

## 2018-11-09 MED ORDER — DEXAMETHASONE SODIUM PHOSPHATE 10 MG/ML IJ SOLN
INTRAMUSCULAR | Status: DC | PRN
  Administered 2018-11-09: 5 mg via INTRAVENOUS

## 2018-11-09 MED ORDER — PHENOL 1.4 % MT LIQD: 1.0000 | OROMUCOSAL | Status: DC | PRN

## 2018-11-09 MED ORDER — 0.9 % SODIUM CHLORIDE (POUR BTL) OPTIME
TOPICAL | Status: DC | PRN
  Administered 2018-11-09: 1000 mL

## 2018-11-09 MED ORDER — ONDANSETRON HCL 4 MG/2ML IJ SOLN: 4.0000 mg | Freq: Four times a day (QID) | INTRAMUSCULAR | Status: DC | PRN

## 2018-11-09 MED ORDER — DOCUSATE SODIUM 100 MG PO CAPS
100.0000 mg | ORAL_CAPSULE | Freq: Every day | ORAL | Status: DC
  Administered 2018-11-10 – 2018-11-15 (×3): 100 mg via ORAL
  Filled 2018-11-09 (×5): qty 1

## 2018-11-09 MED ORDER — LIDOCAINE 2% (20 MG/ML) 5 ML SYRINGE
INTRAMUSCULAR | Status: DC | PRN
  Administered 2018-11-09: 30 mg via INTRAVENOUS

## 2018-11-09 SURGICAL SUPPLY — 54 items
BANDAGE ACE 4X5 VEL STRL LF (GAUZE/BANDAGES/DRESSINGS) ×3 IMPLANT
BANDAGE ACE 6X5 VEL STRL LF (GAUZE/BANDAGES/DRESSINGS) ×3 IMPLANT
BANDAGE ELASTIC 4 VELCRO ST LF (GAUZE/BANDAGES/DRESSINGS) ×3 IMPLANT
BANDAGE ESMARK 6X9 LF (GAUZE/BANDAGES/DRESSINGS) ×1 IMPLANT
BLADE SAGITTAL (BLADE)
BLADE SAGITTAL 25.0X1.19X90 (BLADE) IMPLANT
BLADE SAGITTAL 25.0X1.19X90MM (BLADE)
BLADE SAW GIGLI 510 (BLADE) ×2 IMPLANT
BLADE SAW GIGLI 510MM (BLADE) ×1
BLADE SAW THK.89X75X18XSGTL (BLADE) IMPLANT
BNDG CMPR 9X6 STRL LF SNTH (GAUZE/BANDAGES/DRESSINGS) ×1
BNDG COHESIVE 6X5 TAN STRL LF (GAUZE/BANDAGES/DRESSINGS) ×3 IMPLANT
BNDG ESMARK 6X9 LF (GAUZE/BANDAGES/DRESSINGS) ×3
BNDG GAUZE ELAST 4 BULKY (GAUZE/BANDAGES/DRESSINGS) ×3 IMPLANT
CANISTER SUCT 3000ML PPV (MISCELLANEOUS) ×3 IMPLANT
CLIP VESOCCLUDE MED 6/CT (CLIP) ×3 IMPLANT
COVER SURGICAL LIGHT HANDLE (MISCELLANEOUS) ×3 IMPLANT
COVER WAND RF STERILE (DRAPES) ×3 IMPLANT
CUFF TOURNIQUET SINGLE 24IN (TOURNIQUET CUFF) IMPLANT
CUFF TOURNIQUET SINGLE 34IN LL (TOURNIQUET CUFF) IMPLANT
DRAIN CHANNEL 19F RND (DRAIN) IMPLANT
DRAPE HALF SHEET 40X57 (DRAPES) ×3 IMPLANT
DRAPE ORTHO SPLIT 77X108 STRL (DRAPES) ×6
DRAPE SURG ORHT 6 SPLT 77X108 (DRAPES) ×2 IMPLANT
DRSG ADAPTIC 3X8 NADH LF (GAUZE/BANDAGES/DRESSINGS) ×3 IMPLANT
ELECT CAUTERY BLADE 6.4 (BLADE) ×3 IMPLANT
ELECT REM PT RETURN 9FT ADLT (ELECTROSURGICAL) ×3
ELECTRODE REM PT RTRN 9FT ADLT (ELECTROSURGICAL) ×1 IMPLANT
EVACUATOR SILICONE 100CC (DRAIN) IMPLANT
GAUZE SPONGE 4X4 12PLY STRL (GAUZE/BANDAGES/DRESSINGS) ×3 IMPLANT
GAUZE SPONGE 4X4 12PLY STRL LF (GAUZE/BANDAGES/DRESSINGS) ×2 IMPLANT
GLOVE BIO SURGEON STRL SZ7.5 (GLOVE) ×3 IMPLANT
GOWN STRL REUS W/ TWL LRG LVL3 (GOWN DISPOSABLE) ×2 IMPLANT
GOWN STRL REUS W/ TWL XL LVL3 (GOWN DISPOSABLE) ×1 IMPLANT
GOWN STRL REUS W/TWL LRG LVL3 (GOWN DISPOSABLE) ×6
GOWN STRL REUS W/TWL XL LVL3 (GOWN DISPOSABLE) ×3
KIT BASIN OR (CUSTOM PROCEDURE TRAY) ×3 IMPLANT
KIT TURNOVER KIT B (KITS) ×3 IMPLANT
NS IRRIG 1000ML POUR BTL (IV SOLUTION) ×3 IMPLANT
PACK GENERAL/GYN (CUSTOM PROCEDURE TRAY) ×3 IMPLANT
PAD ARMBOARD 7.5X6 YLW CONV (MISCELLANEOUS) ×6 IMPLANT
STAPLER VISISTAT 35W (STAPLE) ×3 IMPLANT
STOCKINETTE IMPERVIOUS LG (DRAPES) ×3 IMPLANT
SUT ETHILON 3 0 PS 1 (SUTURE) IMPLANT
SUT SILK 0 TIES 10X30 (SUTURE) ×3 IMPLANT
SUT SILK 2 0 (SUTURE) ×3
SUT SILK 2-0 18XBRD TIE 12 (SUTURE) ×1 IMPLANT
SUT SILK 3 0 (SUTURE)
SUT SILK 3-0 18XBRD TIE 12 (SUTURE) IMPLANT
SUT VIC AB 2-0 CT1 18 (SUTURE) ×6 IMPLANT
SUT VIC AB 3-0 SH 8-18 (SUTURE) ×3 IMPLANT
TOWEL GREEN STERILE (TOWEL DISPOSABLE) ×6 IMPLANT
UNDERPAD 30X30 (UNDERPADS AND DIAPERS) ×3 IMPLANT
WATER STERILE IRR 1000ML POUR (IV SOLUTION) ×3 IMPLANT

## 2018-11-09 NOTE — Op Note (Signed)
    Patient name: Jeremiah SpiceWalter L Wnek MRN: [id number] DOB: [birth year] Sex: male  11/09/2018 Pre-operative Diagnosis: critical right lower extremity ischemia Post-operative diagnosis:  Same Surgeon:  Apolinar JunesBrandon C. Randie Heinzain, MD Assistant: Doreatha MassedSamantha Rhyne, PA Procedure Performed: Right above knee amputation   Indications: 72 year old male has history of right lateral heel ulceration.  His SFA is known to be occluded he was initially planned for bypass but presented septic.  He is now indicated for right above-knee amputation.  Findings: There was adequate bleeding in the wound bed to suggest healing.  Skin edges approximated well.   Procedure:  The patient was identified in the holding area and taken to the operating room was placed supine operative table general anesthesia was induced.  He was sterilely prepped and draped in the right lower extremity usual fashion and a timeout was called.  Antibiotics were already up-to-date.  [place] type incision was made above the knee.  We dissected down to the level of the bone transected this with Gigli saw.  Posterior flap was created with amputation knife.  The vessels were clamped and suture ligated.  The nerve was pulled on tension ligated with Vicryl tie and divided.  The wound was irrigated.  The bone was smoothed with rasp.  Hemostasis was obtained and the wound and the fascia was reapproximated with interrupted 2-0 Vicryl sutures.  Staples were placed to the level the skin.  A sterile dressing was placed.  He is laterally from a seizure having tolerated procedure without immediate complication.  All counts were correct at completion.  EBL: 500 cc   Latorria Zeoli C. Randie Heinzain, MD [facility] [place] Office: [phone] Pager: [phone]

## 2018-11-09 NOTE — Plan of Care (Signed)
?  Problem: Coping: ?Goal: Level of anxiety will decrease ?Outcome: Progressing ?  ?Problem: Safety: ?Goal: Ability to remain free from injury will improve ?Outcome: Progressing ?  ?

## 2018-11-09 NOTE — Anesthesia Postprocedure Evaluation (Signed)
Anesthesia Post Note  Patient: Jeremiah [name]  Procedure(s) Performed: RIGHT - AMPUTATION ABOVE KNEE (Right Leg Upper)     Patient location during evaluation: PACU Anesthesia Type: General Level of consciousness: awake and alert, patient cooperative and oriented Pain management: pain level controlled Vital Signs Assessment: post-procedure vital signs reviewed and stable Respiratory status: spontaneous breathing, nonlabored ventilation, respiratory function stable and patient connected to nasal cannula oxygen Cardiovascular status: blood pressure returned to baseline and stable Postop Assessment: no apparent nausea or vomiting Anesthetic complications: no    Last Vitals:  Vitals:   11/09/18 0940 11/09/18 0958  BP:  (!) 156/76  Pulse: 62 70  Resp: 18 20  Temp:  36.7 C  SpO2: 97% 99%    Last Pain:  Vitals:   11/09/18 1113  TempSrc:   PainSc: 0-No pain                 Solomon Skowronek,E. Hartlyn Reigel

## 2018-11-09 NOTE — Transfer of Care (Signed)
Immediate Anesthesia Transfer of Care Note  Patient: Jeremiah [name]  Procedure(s) Performed: RIGHT - AMPUTATION ABOVE KNEE (Right Leg Upper)  Patient Location: PACU  Anesthesia Type:General  Level of Consciousness: awake, drowsy and patient cooperative  Airway & Oxygen Therapy: Patient Spontanous Breathing and Patient connected to face mask oxygen  Post-op Assessment: Report given to RN, Post -op Vital signs reviewed and stable and Patient moving all extremities X 4  Post vital signs: Reviewed and stable  Last Vitals:  Vitals Value Taken Time  BP 96/51 11/09/2018  8:34 AM  Temp    Pulse 66 11/09/2018  8:35 AM  Resp 14 11/09/2018  8:35 AM  SpO2 100 % 11/09/2018  8:35 AM  Vitals shown include unvalidated device data.  Last Pain:  Vitals:   11/09/18 0441  TempSrc: Oral  PainSc:       Patients Stated Pain Goal: 2 (11/08/18 0930)  Complications: No apparent anesthesia complications

## 2018-11-09 NOTE — Anesthesia Procedure Notes (Signed)
Procedure Name: LMA Insertion Date/Time: 11/09/2018 7:40 AM Performed by: Lanell MatarBaker, Jermichael Belmares M, CRNA Pre-anesthesia Checklist: Patient identified, Emergency Drugs available, Suction available and Patient being monitored Patient Re-evaluated:Patient Re-evaluated prior to induction Oxygen Delivery Method: Circle System Utilized Preoxygenation: Pre-oxygenation with 100% oxygen Induction Type: IV induction Ventilation: Mask ventilation without difficulty LMA: LMA inserted LMA Size: 5.0 Number of attempts: 1 Placement Confirmation: positive ETCO2 Tube secured with: Tape Dental Injury: Teeth and Oropharynx as per pre-operative assessment

## 2018-11-09 NOTE — Progress Notes (Signed)
  Date: 11/09/2018  Patient name: Curley SpiceWalter L Duba  Medical record number: [id number]  Date of birth: 1946/01/17   I have seen and evaluated this patient and I have discussed the plan of care with the house staff. Please see Dr. Marigene EhlersLee's note for complete details. I concur with his findings with the following additions/corrections:   Patient will complete Day 6 of antibiotics for GNR bacteremia today at 1pm.  I would continue antibiotics for at least an 8 day course, with day 8 completed on 11/11/18 at 1pm.  Based on review of literature, no need for repeat BC to ensure clearance given we have source control post surgery and if patient does not spike a fever.  Tmax today was 98.76F.    Inez CatalinaMullen, Kaye Mitro B, MD 11/09/2018, 2:49 PM

## 2018-11-09 NOTE — Progress Notes (Signed)
Physical medicine rehabilitation consult requested chart reviewed. Patient [name] this morning 11/09/2018. Plan for therapy evaluations and follow-up with formal rehabilitation consult in a.m.

## 2018-11-09 NOTE — Telephone Encounter (Signed)
sch appt spk to pt wife mld ltr  12/10/2018 1pm staple removal

## 2018-11-09 NOTE — Telephone Encounter (Signed)
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  F/u with Dr. Randie Heinzain in 4 weeks.  Thanks

## 2018-11-09 NOTE — Progress Notes (Signed)
OT Cancellation Note  Patient Details Name: Jeremiah [name] MRN: [id number] DOB: 1945-12-05   Cancelled Treatment:    Reason Eval/Treat Not Completed: Medical issues which prohibited therapy;Patient at procedure or test/ unavailable. Pt at OR earlier this morning and now just returned from OR. Will re attempt next available/appropriate time Galen ManilaSpencer, Marisabel Macpherson Jeanette 11/09/2018, 10:52 AM

## 2018-11-09 NOTE — Plan of Care (Signed)
  Problem: Coping: Goal: Level of anxiety will decrease Outcome: Progressing   Problem: Pain Managment: Goal: General experience of comfort will improve Outcome: Progressing   Problem: Safety: Goal: Ability to remain free from injury will improve Outcome: Progressing   

## 2018-11-09 NOTE — Progress Notes (Signed)
  Progress Note    11/09/2018 7:12 AM Day of Surgery  Subjective:  No acute issues  Vitals:   11/08/18 2021 11/09/18 0441  BP: (!) 158/62 (!) 151/58  Pulse: 72 68  Resp: 20 20  Temp: 98.6 F (37 C) 98.6 F (37 C)  SpO2: 99% 100%    Physical Exam: aaox3 Dressing on right foot cdi  CBC    Component Value Date/Time   WBC 18.0 (H) 11/08/2018 0934   RBC 2.89 (L) 11/08/2018 0934   HGB 7.4 (L) 11/08/2018 0934   HGB 8.9 (L) 10/26/2018 1215   HCT 23.4 (L) 11/08/2018 0934   HCT 27.2 (L) 10/26/2018 1215   PLT 345 11/08/2018 0934   PLT 264 10/26/2018 1215   MCV 81.0 11/08/2018 0934   MCV 80 10/26/2018 1215   MCH 25.6 (L) 11/08/2018 0934   MCHC 31.6 11/08/2018 0934   RDW 14.7 11/08/2018 0934   RDW 14.6 10/26/2018 1215   LYMPHSABS 1.2 11/03/2018 1300   LYMPHSABS 3.2 (H) 10/26/2018 1215   MONOABS 1.6 (H) 11/03/2018 1300   EOSABS 0.1 11/03/2018 1300   EOSABS 1.8 (H) 10/26/2018 1215   BASOSABS 0.0 11/03/2018 1300   BASOSABS 0.1 10/26/2018 1215    BMET    Component Value Date/Time   NA 133 (L) 11/08/2018 0934   NA 138 10/26/2018 1215   K 3.6 11/08/2018 0934   CL 99 11/08/2018 0934   CO2 22 11/08/2018 0934   GLUCOSE 167 (H) 11/08/2018 0934   BUN 10 11/08/2018 0934   BUN 15 10/26/2018 1215   CREATININE 1.20 11/08/2018 0934   CALCIUM 8.2 (L) 11/08/2018 0934   GFRNONAA 59 (L) 11/08/2018 0934   GFRAA >60 11/08/2018 0934    INR    Component Value Date/Time   INR 1.20 11/08/2018 0934     Intake/Output Summary (Last 24 hours) at 11/09/2018 [id number] Last data filed at 11/09/2018 0300 Gross per 24 hour  Intake 600 ml  Output 500 ml  Net 100 ml     Assessment:  72 y.o. male is here with + blood cultures, non salvageable right heel ulceration  Plan: Or today R AKA   Jeremiah C. Randie Heinzain, MD [facility] Office: [phone] Pager: [phone]  11/09/2018 7:12 AM

## 2018-11-09 NOTE — Progress Notes (Signed)
Rec'd patient from PACU awake and oriented. Denies pain at this time. Ace wrap in place to right BKA. Clean and dry. Call light at side. V/S obtained.

## 2018-11-09 NOTE — Progress Notes (Signed)
   Subjective:  Jeremiah [name] is a 72 y.o. with PMH of diastolic CHF, PVD, HTN, recurrent C.Diff, gout, and T2DM admit for infected R foot ulcer on hospital day 6  Jeremiah [name] was examined and evaluated at bedside this AM with family present. He was observed resting comfortably in bed while getting blood transfusion. He states he tolerated his procedure well and he feels fine. He states 'I should have gotten this done earlier.' Denies any fever, chills, nausea, vomiting overnight. Denies any chest pain, palpitations, dyspnea, cough.  Objective:  Vital signs in last 24 hours: Vitals:   11/09/18 0930 11/09/18 0935 11/09/18 0940 11/09/18 0958  BP: (!) 143/66 (!) 152/68  (!) 156/76  Pulse: 66 66 62 70  Resp: 16 15 18 20   Temp: (!) 97.4 F (36.3 C)   98 F (36.7 C)  TempSrc: Temporal   Oral  SpO2: 97% 97% 97% 99%  Weight:      Height:       Physical Exam  Constitutional: He is oriented to person, place, and time and well-developed, well-nourished, and in no distress. No distress.  HENT:  Head: Normocephalic and atraumatic.  Mouth/Throat: Oropharynx is clear and moist.  Eyes: Pupils are equal, round, and reactive to light. Conjunctivae and EOM are normal.  Neck: Normal range of motion. Neck supple.  Cardiovascular: Normal rate, regular rhythm and intact distal pulses.  Murmur (2/6 systolic murmur) heard. Pulmonary/Chest: Effort normal. He has rales (bilateral rales).  Abdominal: Soft. Bowel sounds are normal. There is no abdominal tenderness.  Musculoskeletal: Normal range of motion.        General: Deformity (Right AKA wrapped in bandaging) present.  Neurological: He is alert and oriented to person, place, and time. No cranial nerve deficit. GCS score is 15.  Skin: Skin is warm and dry. He is not diaphoretic.   Assessment/Plan:  Active Problems:   Sepsis (HCC)   Fever   Open wound of right foot   Pleural effusion  Jeremiah [name] is a 72 yo M w/ PMH of diastolic CHF, PVD, HTN,  recurrent C.Diff infection, Gout, and T2DMpresenting with sepsis 2/2 infected R foot. He underwent R AKA today and most likely will no longer have febrile temperature spikes now that his source of infection is gone. Expected discharge soon after he recovers from his operation.   Post-operative anemia on chronic iron deficiency anemia Had stable anemia at ~7.5 prior to operation. Estimated EBL 500 cc in OR - F/u post transfusion h/h - Trend CBC - Continuing to hold iron while getting abx in case of bacteremia  Sepsis 2/2 infected foot ulcer Afebrile overnight. Pan-sensitive proteus mirabilis on culture. - OR this morning for AKA - C/w ampicillin 2g IV daily - Will need Pt/Ot eval  R Pleural Effusion w/ underlying RML nodule Exudative pleural effusion (LDH pleura / serum LDH > 0.6) with mostly leukocytes. No organism found on gram stain and culture NGTD. Underlying 1.1x1.8cm RML nodule found after thoracentesis - Appreciate Cardiothoracic surgery recs: F/u with pulm as outpatient  Peripheral Vascular Disease - C/w home meds: simvastatin 40mg  daily, aspirin 81mg  daily  T2DM - Glucose checks - SSI  HTN - c/w home meds: metoprolol succinate 50mg  daily, losartan 100mg  daily  DVT prophx: subqheparin Diet: Diabetic Bowel: Senokot Code: Full  Dispo: Anticipated discharge in approximately 2-3 day(s).   [name], Haidy Kackley K, MD 11/09/2018, 11:38 AM Pager: [phone]

## 2018-11-10 ENCOUNTER — Encounter (HOSPITAL_COMMUNITY): Payer: Self-pay | Admitting: Vascular Surgery

## 2018-11-10 DIAGNOSIS — E1142 Type 2 diabetes mellitus with diabetic polyneuropathy: Secondary | ICD-10-CM

## 2018-11-10 DIAGNOSIS — Z89611 Acquired absence of right leg above knee: Secondary | ICD-10-CM

## 2018-11-10 DIAGNOSIS — Z9889 Other specified postprocedural states: Secondary | ICD-10-CM

## 2018-11-10 DIAGNOSIS — R7881 Bacteremia: Secondary | ICD-10-CM

## 2018-11-10 LAB — BASIC METABOLIC PANEL
Anion gap: 8 (ref 5–15)
BUN: 17 mg/dL (ref 8–23)
CO2: 25 mmol/L (ref 22–32)
Calcium: 7.8 mg/dL — ABNORMAL LOW (ref 8.9–10.3)
Chloride: 100 mmol/L (ref 98–111)
Creatinine, Ser: 1.35 mg/dL — ABNORMAL HIGH (ref 0.61–1.24)
GFR calc Af Amer: 60 mL/min — ABNORMAL LOW (ref >60)
GFR calc non Af Amer: 51 mL/min — ABNORMAL LOW (ref >60)
Glucose, Bld: 159 mg/dL — ABNORMAL HIGH (ref 70–99)
Potassium: 4.1 mmol/L (ref 3.5–5.1)
Sodium: 133 mmol/L — ABNORMAL LOW (ref 135–145)

## 2018-11-10 LAB — CBC
HCT: 24.4 % — ABNORMAL LOW (ref 39.0–52.0)
HCT: 24 % — ABNORMAL LOW (ref 39.0–52.0)
Hemoglobin: 8 g/dL — ABNORMAL LOW (ref 13.0–17.0)
Hemoglobin: 8.2 g/dL — ABNORMAL LOW (ref 13.0–17.0)
MCH: 26.8 pg (ref 26.0–34.0)
MCH: 27.9 pg (ref 26.0–34.0)
MCHC: 32.8 g/dL (ref 30.0–36.0)
MCHC: 34.2 g/dL (ref 30.0–36.0)
MCV: 81.6 fL (ref 80.0–100.0)
MCV: 81.6 fL (ref 80.0–100.0)
Platelets: 353 10*3/uL (ref 150–400)
Platelets: 383 10*3/uL (ref 150–400)
RBC: 2.99 MIL/uL — ABNORMAL LOW (ref 4.22–5.81)
RBC: 2.94 MIL/uL — ABNORMAL LOW (ref 4.22–5.81)
RDW: 14.3 % (ref 11.5–15.5)
RDW: 14.2 % (ref 11.5–15.5)
WBC: 19.1 10*3/uL — ABNORMAL HIGH (ref 4.0–10.5)
WBC: 18.6 10*3/uL — ABNORMAL HIGH (ref 4.0–10.5)
nRBC: 0 % (ref 0.0–0.2)
nRBC: 0 % (ref 0.0–0.2)

## 2018-11-10 LAB — BPAM RBC
Blood Product Expiration Date: 202001032359
Blood Product Expiration Date: 202001052359
ISSUE DATE / TIME: 201912170719
ISSUE DATE / TIME: 201912170853
Unit Type and Rh: 7300
Unit Type and Rh: 7300

## 2018-11-10 LAB — TYPE AND SCREEN
ABO/RH(D): B POS
Antibody Screen: NEGATIVE
Unit division: 0
Unit division: 0

## 2018-11-10 LAB — GLUCOSE, CAPILLARY
Glucose-Capillary: 243 mg/dL — ABNORMAL HIGH (ref 70–99)
Glucose-Capillary: 144 mg/dL — ABNORMAL HIGH (ref 70–99)
Glucose-Capillary: 131 mg/dL — ABNORMAL HIGH (ref 70–99)
Glucose-Capillary: 129 mg/dL — ABNORMAL HIGH (ref 70–99)
Glucose-Capillary: 82 mg/dL (ref 70–99)

## 2018-11-10 NOTE — Progress Notes (Signed)
Physical Therapy Treatment Patient Details Name: Jeremiah [name] MRN: [id number] DOB: 03-May-1946 Today's Date: 11/10/2018    History of Present Illness Pt is a 72 y.o. male admitted 11/03/18 with RLE pain and AMS; worked up for sepsis. Xray without osteomyelitis. Head CT without acute abnormality. 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 PMH includes R foot ulcer, DM2, PVD, HTN, CHF, arthritis.   PT Comments    Pt now s/p right AKA on 11/09/18. Demonstrates good movement of R residual limb hip flexors, although painful with mobility. Pt currently requiring mod-maxA+2 to stand and pivot to recliner with BUE support. Educ on precautions, positioning and importance of mobility. Feel pt would benefit from more intensive CIR-level therapies to maximize functional mobility and independence prior to return home.    Follow Up Recommendations  CIR;Supervision for mobility/OOB     Equipment Recommendations  (TBD)    Recommendations for Other Services       Precautions / Restrictions Precautions Precautions: Fall Restrictions Weight Bearing Restrictions: Yes RLE Weight Bearing: Non weight bearing    Mobility  Bed Mobility Overal bed mobility: Needs Assistance Bed Mobility: Supine to Sit     Supine to sit: Mod assist;+2 for physical assistance;HOB elevated     General bed mobility comments: ModA+2 to assist trunk elevation and assist hips to EOB. Pt able to initiate BLE hip movements towards EOB with cues demonstrate R hip flexion  Transfers Overall transfer level: Needs assistance Equipment used: Rolling walker (2 wheeled);2 person hand held assist Transfers: Sit to/from UGI CorporationStand;Stand Pivot Transfers Sit to Stand: Mod assist;+2 physical assistance;From elevated surface;Max assist Stand pivot transfers: Max assist;+2 physical assistance;From elevated surface       General transfer comment: Mod-maxA+2 to assist trunk elevation and cue hip/trunk extension standing at EOB with RW. Able to perform  second standing trial to pivot to recliner with BUE support and maxA+2. Pt with difficulty engaging LLE to support body weight  Ambulation/Gait                 Stairs             Wheelchair Mobility    Modified Rankin (Stroke Patients Only)       Balance Overall balance assessment: Needs assistance Sitting-balance support: No upper extremity supported Sitting balance-Leahy Scale: Fair     Standing balance support: Bilateral upper extremity supported;During functional activity Standing balance-Leahy Scale: Poor                              Cognition Arousal/Alertness: Awake/alert Behavior During Therapy: Flat affect Overall Cognitive Status: Impaired/Different from baseline Area of Impairment: Attention;Memory;Following commands;Safety/judgement;Awareness;Problem solving                   Current Attention Level: Selective Memory: Decreased short-term memory Following Commands: Follows one step commands inconsistently;Follows one step commands with increased time Safety/Judgement: Decreased awareness of safety;Decreased awareness of deficits Awareness: Intellectual Problem Solving: Slow processing;Decreased initiation;Difficulty sequencing;Requires verbal cues;Requires tactile cues        Exercises      General Comments        Pertinent Vitals/Pain Pain Assessment: Faces Faces Pain Scale: Hurts little more Pain Location: R residual limb Pain Descriptors / Indicators: Discomfort;Guarding;Grimacing Pain Intervention(s): Monitored during session;Limited activity within patient's tolerance;Repositioned    Home Living                      Prior Function  PT Goals (current goals can now be found in the care plan section) Acute Rehab PT Goals Patient Stated Goal: Get stronger PT Goal Formulation: With patient Time For Goal Achievement: 11/18/18 Potential to Achieve Goals: Good Progress towards PT goals:  Progressing toward goals    Frequency    Min 3X/week      PT Plan Discharge plan needs to be updated    Co-evaluation PT/OT/SLP Co-Evaluation/Treatment: Yes Reason for Co-Treatment: Necessary to address cognition/behavior during functional activity;For patient/therapist safety;To address functional/ADL transfers PT goals addressed during session: Mobility/safety with mobility        AM-PAC PT "6 Clicks" Mobility   Outcome Measure  Help needed turning from your back to your side while in a flat bed without using bedrails?: A Little Help needed moving from lying on your back to sitting on the side of a flat bed without using bedrails?: A Lot Help needed moving to and from a bed to a chair (including a wheelchair)?: A Lot Help needed standing up from a chair using your arms (e.g., wheelchair or bedside chair)?: A Lot Help needed to walk in hospital room?: A Lot Help needed climbing 3-5 steps with a railing? : Total 6 Click Score: 12    End of Session Equipment Utilized During Treatment: Gait belt Activity Tolerance: Patient tolerated treatment well Patient left: in chair;with call bell/phone within reach;with chair alarm set Nurse Communication: Mobility status PT Visit Diagnosis: Other abnormalities of gait and mobility (R26.89);Muscle weakness (generalized) (M62.81)     Time: 1610-9604 PT Time Calculation (min) (ACUTE ONLY): 25 min  Charges:  $Therapeutic Activity: 8-22 mins                    Ina Homes, PT, DPT Acute Rehabilitation Services  Pager [phone] Office [phone]  Malachy Chamber 11/10/2018, 10:30 AM

## 2018-11-10 NOTE — Progress Notes (Signed)
IP rehab admissions - I am following for potential acute inpatient rehab admission.  [facility] and will await call back from case manager.  I will follow up with patient in am.  Call me for questions.  [phone]

## 2018-11-10 NOTE — [place] FL2 (Signed)
[place] MEDICAID FL2 LEVEL OF CARE SCREENING TOOL     IDENTIFICATION  Patient Name: Jeremiah [name] Birthdate: Nov 09, 1946 Sex: male Admission Date (Current Location): 11/03/2018  [facility] and [place] Number:  Producer, television/film/video and Address:  [facility] [place], [address], [place], [place] [address]      Provider Number: [id number]  Attending Physician Name and Address:  Inez Catalina, MD  Relative Name and Phone Number:       Current Level of Care: Hospital Recommended Level of Care: Skilled Nursing Facility Prior Approval Number:    Date Approved/Denied:   PASRR Number: [id number]  Discharge Plan: SNF    Current Diagnoses: Patient Active Problem List   Diagnosis Date Noted  . Pleural effusion 11/08/2018  . Fever   . Open wound of right foot   . Sepsis (HCC) 11/03/2018  . Altered mental status   . Acute cystitis without hematuria   . Leukocytosis   . Pressure injury of skin 07/08/2018  . Clostridium difficile colitis 07/07/2018  . Hypokalemia 07/07/2018  . HLD (hyperlipidemia) 07/07/2018  . Acute diverticulitis 06/04/2018  . Cholecystitis 05/24/2018  . Cholecystitis with cholelithiasis 05/21/2018  . Acute systolic heart failure (HCC) 03/27/2018  . Diabetes mellitus without complication (HCC) 01/30/2018  . Hypertension 01/30/2018  . Acute cholecystitis 01/30/2018  . AKI (acute kidney injury) (HCC) 01/30/2018  . Normocytic anemia 01/30/2018  . PVD (peripheral vascular disease) (HCC) 09/05/2014    Orientation RESPIRATION BLADDER Height & Weight     Self, Situation, Place  Normal Incontinent, External catheter Weight: 161 lb 9.6 oz (73.3 kg) Height:  5\' 9"  (175.3 cm)  BEHAVIORAL SYMPTOMS/MOOD NEUROLOGICAL BOWEL NUTRITION STATUS  (None) (None) Incontinent Diet(Carb modified)  AMBULATORY STATUS COMMUNICATION OF NEEDS Skin   Extensive Assist Verbally Surgical wounds, Other (Comment)(Cracking.)                        Personal Care Assistance Level of Assistance  Bathing, Feeding, Dressing Bathing Assistance: Limited assistance Feeding assistance: Independent Dressing Assistance: Limited assistance     Functional Limitations Info  Sight, Hearing, Speech Sight Info: Adequate Hearing Info: Adequate Speech Info: Adequate    SPECIAL CARE FACTORS FREQUENCY  PT (By licensed PT), OT (By licensed OT)     PT Frequency: 5 x week OT Frequency: 5 x week            Contractures Contractures Info: Not present    Additional Factors Info  Code Status, Allergies Code Status Info: Full code Allergies Info: NKDA           Current Medications (11/10/2018):  This is the current hospital active medication list Current Facility-Administered Medications  Medication Dose Route Frequency Provider Last Rate Last Dose  . 0.9 %  sodium chloride infusion (Manually program via Guardrails IV Fluids)   Intravenous Once Rhyne, Samantha J, PA-C      . 0.9 %  sodium chloride infusion   Intravenous PRN Rhyne, Ames Coupe, PA-C 10 mL/hr at 11/07/18 1606    . 0.9 %  sodium chloride infusion   Intravenous Continuous Dara Lords, PA-C 75 mL/hr at 11/10/18 0340    . acetaminophen (TYLENOL) tablet 650 mg  650 mg Oral Q6H PRN Dara Lords, PA-C   650 mg at 11/08/18 1642   Or  . acetaminophen (TYLENOL) suppository 650 mg  650 mg Rectal Q6H PRN Rhyne, Samantha J, PA-C      . ampicillin (  OMNIPEN) 2 g in sodium chloride 0.9 % 100 mL IVPB  2 g Intravenous Q6H Rhyne, Samantha J, PA-C 300 mL/hr at 11/10/18 0826 2 g at 11/10/18 0826  . aspirin chewable tablet 81 mg  81 mg Oral Daily Dara LordsRhyne, Samantha J, PA-C   81 mg at 11/10/18 [id number]  . brimonidine (ALPHAGAN) 0.2 % ophthalmic solution 1 drop  1 drop Both Eyes BID Rhyne, Ames CoupeSamantha J, PA-C   1 drop at 11/10/18 0829  . docusate sodium (COLACE) capsule 100 mg  100 mg Oral Daily Rhyne, Ames CoupeSamantha J, PA-C   100 mg at 11/10/18 [id number]  . heparin injection 5,000 Units  5,000 Units  Subcutaneous Q8H Rhyne, Samantha J, PA-C   5,000 Units at 11/10/18 0551  . insulin aspart (novoLOG) injection 0-15 Units  0-15 Units Subcutaneous TID WC Rhyne, Samantha J, PA-C   2 Units at 11/10/18 0830  . losartan (COZAAR) tablet 100 mg  100 mg Oral Daily Rhyne, Ames CoupeSamantha J, PA-C   100 mg at 11/09/18 2211  . metoprolol succinate (TOPROL-XL) 24 hr tablet 50 mg  50 mg Oral Daily Rhyne, Samantha J, PA-C   50 mg at 11/10/18 [id number]  . morphine 2 MG/ML injection 2 mg  2 mg Intravenous Q2H PRN Rhyne, Samantha J, PA-C      . ondansetron (ZOFRAN) injection 4 mg  4 mg Intravenous Q6H PRN Rhyne, Samantha J, PA-C      . oxyCODONE-acetaminophen (PERCOCET/ROXICET) 5-325 MG per tablet 1-2 tablet  1-2 tablet Oral Q4H PRN Rhyne, Ames CoupeSamantha J, PA-C   2 tablet at 11/10/18 [id number]  . phenol (CHLORASEPTIC) mouth spray 1 spray  1 spray Mouth/Throat PRN Rhyne, Samantha J, PA-C      . promethazine (PHENERGAN) tablet 12.5 mg  12.5 mg Oral Q6H PRN Rhyne, Samantha J, PA-C      . senna-docusate (Senokot-S) tablet 1 tablet  1 tablet Oral QHS PRN Rhyne, Samantha J, PA-C      . simvastatin (ZOCOR) tablet 40 mg  40 mg Oral Daily Rhyne, Samantha J, PA-C   40 mg at 11/10/18 [id number]  . timolol (TIMOPTIC) 0.5 % ophthalmic solution 1 drop  1 drop Both Eyes BID Rhyne, Ames CoupeSamantha J, PA-C   1 drop at 11/10/18 [id number]     Discharge Medications: Please see discharge summary for a list of discharge medications.  Relevant Imaging Results:  Relevant Lab Results:   Additional Information SS#: [id number]  Margarito LinerSarah C Zamari Bonsall, LCSW

## 2018-11-10 NOTE — Care Management Important Message (Signed)
Important Message  Patient Details  Name: Jeremiah [name] MRN: [id number] Date of Birth: [birth year]   Medicare Important Message Given:  Yes    Oralia RudMegan P Aleshia Cartelli 11/10/2018, 12:54 PM

## 2018-11-10 NOTE — Consult Note (Signed)
Physical Medicine and Rehabilitation Consult Reason for Consult:  Decreased functional mobility Referring Physician: [facility]   HPI: Jeremiah [name] is a 72 y.o.right handed male  with history of diastolic congestive heart failure, hypertension, recurrent C. Difficile infection, type 2 diabetes mellitus. Per chart review patient lives with spouse. One level home with 5 steps to entry. Jeremiah [name] was able to perform household ambulation with a rolling walker. Wife does assist with some basic ADLs. Wife does receive hemodialysis 3 days a week. 2 sons in the area work. Presented 11/04/2018 with ischemic ulceration right heel/altered mental status as well as noted leukocytosis 16,500 and low-grade fever. Cranial CT scan negative. X-rays of right foot showed no evidence of osteomyelitis.Blood cultures Proteus Mirabilis and maintain on antibiotic therapy. 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 Lemar Livings. Hospital course pain management. Acute blood loss anemia 9.7, leukocytosis 20,500. Plans to continue antibiotic therapy through 11/11/2018. Therapy evaluations pending. M.D. has requested physical medicine rehabilitation consult.  Discussed neuropathy with patient and wife  Review of Systems  Constitutional: Positive for fever. Negative for chills.  Eyes: Negative for blurred vision and double vision.  Respiratory: Negative for cough and shortness of breath.   Cardiovascular: Positive for leg swelling. Negative for chest pain and palpitations.  Gastrointestinal: Positive for constipation. Negative for nausea and vomiting.  Genitourinary: Positive for urgency. Negative for dysuria, flank pain and hematuria.  Musculoskeletal: Positive for joint pain and myalgias.  Skin: Negative for rash.  Neurological: Positive for weakness.  All other systems reviewed and are negative.  Past Medical History:  Diagnosis Date  . Arthritis    "joints; shoulders, knees, hands,  back" (05/21/2018)  . C. difficile diarrhea 04/2018  . Diastolic CHF (HCC)   . High cholesterol   . History of gout   . Hypertension   . Peripheral vascular disease (HCC)   . Pneumonia    "couple times" (05/21/2018)  . Sleep apnea    "has mask; won't use" (05/21/2018)  . Type II diabetes mellitus (HCC)    Past Surgical History:  Procedure Laterality Date  . ABDOMINAL AORTOGRAM W/LOWER EXTREMITY N/A 11/01/2018   Procedure: ABDOMINAL AORTOGRAM W/LOWER EXTREMITY;  Surgeon: Runell Gess, MD;  Location: [facility] INVASIVE CV LAB;  Service: Cardiovascular;  Laterality: N/A;  . CATARACT EXTRACTION W/ INTRAOCULAR LENS  IMPLANT, BILATERAL Bilateral   . CHOLECYSTECTOMY  05/21/2018   ATTEMPTED LAPAROSCOPIC CHOLECYSTECTOMY, OPEN DRAINAGE OF GALLBLADDER WITH BIOPSY  . CHOLECYSTECTOMY N/A 05/21/2018   Procedure: ATTEMPTED LAPAROSCOPIC CHOLECYSTECTOMY, OPEN DRAINAGE OF GALLBLADDER WITH BIOPSY;  Surgeon: Griselda Miner, MD;  Location: [facility] OR;  Service: General;  Laterality: N/A;  . COLONOSCOPY W/ POLYPECTOMY    . COLONOSCOPY WITH PROPOFOL N/A 09/16/2018   Procedure: COLONOSCOPY WITH PROPOFOL;  Surgeon: Charlott Rakes, MD;  Location: [facility] ENDOSCOPY;  Service: Endoscopy;  Laterality: N/A;  . 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 IR CATHETER TUBE CHANGE  04/14/2018  . IR CHOLANGIOGRAM EXISTING TUBE  03/17/2018  . IR PERC CHOLECYSTOSTOMY  01/31/2018  . IR RADIOLOGIST EVAL & MGMT  03/02/2018   Family History  Problem Relation Age of Onset  . Diabetes Mother   . Hypertension Mother   . Heart disease Father   . Heart attack Father   . Diabetes Sister   . Hypertension Sister   . Diabetes  Brother   . Heart disease Brother   . Hypertension Brother   . Heart attack Brother    Social History:  reports that Jeremiah [name] quit smoking about 34 years ago. His smoking use included cigarettes. Jeremiah [name] quit  after 24.00 years of use. Jeremiah [name] has never used smokeless tobacco. Jeremiah [name] reports previous alcohol use. Jeremiah [name] reports that Jeremiah [name] does not use drugs. Allergies: No Known Allergies Medications Prior to Admission  Medication Sig Dispense Refill  . acetaminophen (TYLENOL) 500 MG tablet Take 1,000 mg by mouth every 6 (six) hours as needed for moderate pain or headache.    Marland Kitchen aspirin 81 MG tablet Take 81 mg by mouth daily.    . brimonidine (ALPHAGAN) 0.2 % ophthalmic solution Place 1 drop into both eyes 2 (two) times daily.     . cadexomer iodine (IODOSORB) 0.9 % gel Apply 1 application topically daily as needed for wound care. 40 g 2  . glimepiride (AMARYL) 4 MG tablet Take 4 mg by mouth 2 (two) times daily.    Marland Kitchen losartan (COZAAR) 100 MG tablet Take 100 mg by mouth daily.  3  . metoprolol succinate (TOPROL-XL) 50 MG 24 hr tablet Take 1 tablet (50 mg total) by mouth daily. Take with or immediately following a meal. 30 tablet 3  . Multiple Vitamin (MULTIVITAMIN WITH MINERALS) TABS tablet Take 1 tablet by mouth daily.    . simvastatin (ZOCOR) 40 MG tablet Take 40 mg by mouth daily.    . timolol (TIMOPTIC) 0.5 % ophthalmic solution Place 1 drop into both eyes 2 (two) times daily.  6    Home: Home Living Family/patient expects to be discharged to:: Private residence Living Arrangements: Spouse/significant other Available Help at Discharge: Family, Available 24 hours/day Type of Home: House Home Access: Stairs to enter [employer] of Steps: 5 Entrance Stairs-Rails: Right, Left Home Layout: One level Bathroom Shower/Tub: Health visitor: Handicapped height Home Equipment: Environmental consultant - 2 wheels, Shower seat, Medical laboratory scientific officer - single point, Bedside commode  Functional History: Prior Function Level of Independence: Needs assistance Gait / Transfers Assistance Needed: Able to perform household ambulation with RW ADL's / Homemaking Assistance Needed: Wife assists with sponge bathing at sink and other  ADLs as needed (wife herself does HD MWF) Functional Status:  Mobility: Bed Mobility Overal bed mobility: Needs Assistance Bed Mobility: Supine to Sit Supine to sit: Mod assist, HOB elevated General bed mobility comments: ModA to assist trunk elevation and assist hips to EOB Transfers Overall transfer level: Needs assistance Equipment used: Rolling walker (2 wheeled) Transfers: Sit to/from Stand Sit to Stand: Mod assist, From elevated surface General transfer comment: Max encouragement to assist as much as possible, requiring modA for trunk elevation and to cue upright posture. Pt reaching to arm rest of recliner and starting to fall backwards, requiring max cues for safety and to keep hands on RW Ambulation/Gait Ambulation/Gait assistance: Mod assist Gait Distance (Feet): 1 Feet Assistive device: Rolling walker (2 wheeled) Gait Pattern/deviations: Step-to pattern, Trunk flexed, Leaning posteriorly General Gait Details: Took pivotal steps from bed to recliner requiring modA to prevent posterior LOB; cues for sequencing and to prevent pt sitting prematurely. Pt easily distracted not following commands well Gait velocity: Decreased    ADL:    Cognition: Cognition Overall Cognitive Status: Impaired/Different from baseline Orientation Level: Oriented to person, Oriented to place, Disoriented to time, Oriented to situation Cognition Arousal/Alertness: Awake/alert Behavior During Therapy: Flat affect Overall Cognitive Status: Impaired/Different from baseline Area of Impairment: Attention, Memory,  Following commands, Safety/judgement, Awareness, Problem solving Current Attention Level: Selective Memory: Decreased short-term memory Following Commands: Follows one step commands inconsistently Safety/Judgement: Decreased awareness of safety, Decreased awareness of deficits Awareness: Intellectual Problem Solving: Slow processing, Decreased initiation, Difficulty sequencing, Requires  verbal cues, Requires tactile cues General Comments: Pt had been visiting with family for a while and not told anybody Jeremiah [name] was sitting in bowel incontinence  Blood pressure 122/60, pulse 72, temperature (!) 97.5 F (36.4 C), temperature source Oral, resp. rate 20, height 5\' 9"  (1.753 m), weight 73.3 kg, SpO2 99 %. Physical Exam  Vitals reviewed. Constitutional: Jeremiah [name] is oriented to person, place, and time.  HENT:  Head: Normocephalic.  Eyes: EOM are normal. Right eye exhibits no discharge. Left eye exhibits no discharge.  Neck: Normal range of motion. Neck supple. No thyromegaly present.  Cardiovascular: Normal rate and regular rhythm.  Respiratory: Effort normal and breath sounds normal. No respiratory distress.  GI: Soft. Bowel sounds are normal. Jeremiah [name] exhibits no distension.  Neurological: Jeremiah [name] is alert and oriented to person, place, and time.  Skin:  Amputation site is dressed appropriately tender  Strength is 5/5 bilateral deltoid bicep tricep grip 4/5 left hip flexion knee extension ankle dorsiflexion plantarflexion Sensation is reduced below the ankle on the left side to light touch. Left foot no evidence of skin breakdown  Results for orders placed or performed during the hospital encounter of 11/03/18 (from the past 24 hour(s))  Glucose, capillary     Status: Abnormal   Collection Time: 11/09/18  7:13 AM  Result Value Ref Range   Glucose-Capillary 157 (H) 70 - 99 mg/dL  Prepare RBC     Status: None   Collection Time: 11/09/18  7:16 AM  Result Value Ref Range   Order Confirmation ORDER PROCESSED BY BLOOD BANK   Prepare RBC (crossmatch)     Status: None   Collection Time: 11/09/18  8:50 AM  Result Value Ref Range   Order Confirmation ORDER PROCESSED BY BLOOD BANK   Glucose, capillary     Status: Abnormal   Collection Time: 11/09/18  9:08 AM  Result Value Ref Range   Glucose-Capillary 151 (H) 70 - 99 mg/dL   Comment 1 Notify RN    Comment 2 Document in Chart   Glucose, capillary      Status: Abnormal   Collection Time: 11/09/18 12:56 PM  Result Value Ref Range   Glucose-Capillary 212 (H) 70 - 99 mg/dL  CBC     Status: Abnormal   Collection Time: 11/09/18  2:34 PM  Result Value Ref Range   WBC 20.5 (H) 4.0 - 10.5 K/uL   RBC 3.62 (L) 4.22 - 5.81 MIL/uL   Hemoglobin 9.7 (L) 13.0 - 17.0 g/dL   HCT 14.729.2 (L) 82.939.0 - 56.252.0 %   MCV 80.7 80.0 - 100.0 fL   MCH 26.8 26.0 - 34.0 pg   MCHC 33.2 30.0 - 36.0 g/dL   RDW 13.013.8 86.511.5 - 78.415.5 %   Platelets 345 150 - 400 K/uL   nRBC 0.0 0.0 - 0.2 %   No results found.   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 Does the need for close, 24 hr/day medical supervision in concert with the patient's rehab needs make it unreasonable for this patient to be served in a less intensive setting? Yes 2. Co-Morbidities requiring supervision/potential complications: Diabetes with peripheral neuropathy 3. Due to bladder management, bowel management, safety, skin/wound care, disease management, medication administration, pain  management and patient education, does the patient require 24 hr/day rehab nursing? Yes 4. Does the patient require coordinated care of a physician, rehab nurse, PT (1-2 hrs/day, 5 days/week) and OT (1-2 hrs/day, 5 days/week) to address physical and functional deficits in the context of the above medical diagnosis(es)? Yes Addressing deficits in the following areas: balance, endurance, locomotion, strength, transferring, bowel/bladder control, bathing, dressing, feeding, grooming, toileting and psychosocial support 5. Can the patient actively participate in an intensive therapy program of at least 3 hrs of therapy per day at least 5 days per week? Yes 6. The potential for patient to make measurable gains while on inpatient rehab is excellent 7. Anticipated functional outcomes upon discharge from inpatient rehab are supervision  with PT, supervision and min assist  with OT, n/a with SLP. 8. Estimated rehab length of stay to reach the above functional goals is: 10-16d 9. Anticipated D/C setting: Home 10. Anticipated post D/C treatments: HH therapy 11. Overall Rehab/Functional Prognosis: excellent  RECOMMENDATIONS: This patient's condition is appropriate for continued rehabilitative care in the following setting: CIR Patient has agreed to participate in recommended program. Yes Note that insurance prior authorization may be required for reimbursement for recommended care.  Comment: should be ready in 1-2 d "I have personally performed a face to face diagnostic evaluation of this patient.  Additionally, I have reviewed and concur with the physician assistant's documentation above." Erick Colace M.D. [facility] FAAPM&R (Sports Med, Neuromuscular Med) Diplomate [place] Board of Electrodiagnostic Med  Charlton Amor, PA-C 11/10/2018

## 2018-11-10 NOTE — Progress Notes (Signed)
  Date: 11/10/2018  Patient name: Jeremiah [name]  Medical record number: [id number]  Date of birth: [birth year]   I have seen and evaluated this patient and I have discussed the plan of care with the house staff. Please see Dr. Marigene EhlersLee's note for complete details. I concur with his findings with the following additions/corrections:   Patient is improving today.  He will have 1 more day of Abx and then will complete his course of Proteus bacteremia now with source control.  He is being evaluated for possible CIR vs. Rehab.  Will plan for discharge once that is decided.  Will need a review of his home medications prior to leaving to ensure he is on appropriate medications at discharge.   Inez CatalinaMullen, Alfred Harrel B, MD 11/10/2018, 3:05 PM

## 2018-11-10 NOTE — Progress Notes (Signed)
Physical Therapy Treatment Patient Details Name: Jeremiah [name] MRN: [id number] DOB: 1946/03/21 Today's Date: 11/10/2018    History of Present Illness Pt is a 72 y.o. male admitted 11/03/18 with RLE pain and AMS; worked up for sepsis. Xray without osteomyelitis. Head CT without acute abnormality. 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 PMH includes R foot ulcer, DM2, PVD, HTN, CHF, arthritis.   PT Comments    Pt seen for second session for transfer practice returning to bed. Pt tolerated sitting in recliner >4 hours this morning. Able to perform lateral scoot transfer from drop arm recliner to bed with modA+2. Once seated EOB, reliant on UE support to maintain balance when reaching outside BOS; requires UE support to prevent LOB with minimal challenge. Bed mobility with min-modA+2; pt with bowel incontinence, dependent for pericare. Continue to recommend intensive CIR-level therapies. Pt motivated for future RLE prosthetic use and return to ambulatory PLOF. Wife present and supportive.   Follow Up Recommendations  CIR;Supervision for mobility/OOB     Equipment Recommendations  (TBD)    Recommendations for Other Services       Precautions / Restrictions Precautions Precautions: Fall Restrictions Weight Bearing Restrictions: Yes RLE Weight Bearing: Non weight bearing    Mobility  Bed Mobility Overal bed mobility: Needs Assistance Bed Mobility: Sit to Supine;Rolling Rolling: Min assist   Supine to sit: Mod assist;+2 for physical assistance;HOB elevated Sit to supine: Mod assist   General bed mobility comments: Pt able to return to supine with modA for trunk support. Able to roll R/L with minA, cues for technique and reliant on bed rail. Scooted up in bed with BUEs pulling on bed rails and modA  Transfers Overall transfer level: Needs assistance Equipment used: Rolling walker (2 wheeled);2 person hand held assist Transfers: Lateral/Scoot Transfers Sit to Stand: Mod assist;+2 physical  assistance;From elevated surface;Max assist Stand pivot transfers: Max assist;+2 physical assistance;From elevated surface      Lateral/Scoot Transfers: Mod assist;+2 safety/equipment General transfer comment: Lateral scoot transfer from drop arm recliner to bed with modA+2 (use of bed pad); pt able to assist well with BUE support, requiring intermittent cues for LLE setup with foot on floor to assist. Pt incontinent of bowel upon return to bed, returned to supine for pericare  Ambulation/Gait                 Stairs             Wheelchair Mobility    Modified Rankin (Stroke Patients Only)       Balance Overall balance assessment: Needs assistance Sitting-balance support: No upper extremity supported Sitting balance-Leahy Scale: Fair Sitting balance - Comments: Increased time sitting EOB, difficulty reaching outside of BOS, only able to tolerate minimal challenge, relying on UE support to maintain balance   Standing balance support: Bilateral upper extremity supported;During functional activity Standing balance-Leahy Scale: Poor                              Cognition Arousal/Alertness: Awake/alert Behavior During Therapy: Flat affect Overall Cognitive Status: Impaired/Different from baseline Area of Impairment: Attention;Memory;Following commands;Safety/judgement;Awareness;Problem solving                   Current Attention Level: Selective Memory: Decreased short-term memory Following Commands: Follows one step commands inconsistently;Follows one step commands with increased time Safety/Judgement: Decreased awareness of safety;Decreased awareness of deficits Awareness: Intellectual Problem Solving: Slow processing;Decreased initiation;Difficulty sequencing;Requires verbal cues;Requires tactile cues  Exercises General Exercises - Lower Extremity Short Arc Quad: AROM;Left;Supine Heel Slides: AROM;Left;Supine Hip Flexion/Marching:  AROM;Right;Left;Supine    General Comments General comments (skin integrity, edema, etc.): Wife present      Pertinent Vitals/Pain Pain Assessment: Faces Pain Score: 5  Faces Pain Scale: Hurts little more Pain Location: R residual limb Pain Descriptors / Indicators: Discomfort;Guarding;Grimacing Pain Intervention(s): Limited activity within patient's tolerance;Monitored during session;RN gave pain meds during session    Home Living Family/patient expects to be discharged to:: Private residence Living Arrangements: Spouse/significant other Available Help at Discharge: Family;Available 24 hours/day Type of Home: House Home Access: Stairs to enter Entrance Stairs-Rails: Right;Left Home Layout: One level Home Equipment: Environmental consultantWalker - 2 wheels;Shower seat;Cane - single point;Bedside commode      Prior Function Level of Independence: Needs assistance  Gait / Transfers Assistance Needed: Able to perform household ambulation with RW ADL's / Homemaking Assistance Needed: Wife assists with sponge bathing at sink and other ADLs as needed (wife herself does HD MWF)     PT Goals (current goals can now be found in the care plan section) Acute Rehab PT Goals Patient Stated Goal: Get stronger PT Goal Formulation: With patient Time For Goal Achievement: 11/18/18 Potential to Achieve Goals: Good Progress towards PT goals: Progressing toward goals    Frequency    Min 3X/week      PT Plan Current plan remains appropriate    Co-evaluation PT/OT/SLP Co-Evaluation/Treatment: Yes Reason for Co-Treatment: For patient/therapist safety;To address functional/ADL transfers PT goals addressed during session: Mobility/safety with mobility OT goals addressed during session: ADL's and self-care;Proper use of Adaptive equipment and DME      AM-PAC PT "6 Clicks" Mobility   Outcome Measure  Help needed turning from your back to your side while in a flat bed without using bedrails?: A Little Help  needed moving from lying on your back to sitting on the side of a flat bed without using bedrails?: A Little Help needed moving to and from a bed to a chair (including a wheelchair)?: A Lot Help needed standing up from a chair using your arms (e.g., wheelchair or bedside chair)?: A Lot Help needed to walk in hospital room?: A Lot Help needed climbing 3-5 steps with a railing? : Total 6 Click Score: 13    End of Session   Activity Tolerance: Patient tolerated treatment well Patient left: in bed;with call bell/phone within reach;with bed alarm set;with nursing/sitter in room;with family/visitor present Nurse Communication: Mobility status PT Visit Diagnosis: Other abnormalities of gait and mobility (R26.89);Muscle weakness (generalized) (M62.81)     Time: [id number] PT Time Calculation (min) (ACUTE ONLY): 24 min  Charges:  $Therapeutic Activity: 8-22 mins                    Ina HomesJaclyn Laurieanne Galloway, PT, DPT Acute Rehabilitation Services  Pager [phone] Office [phone]  Malachy ChamberJaclyn L Yesika Rispoli 11/10/2018, 4:14 PM

## 2018-11-10 NOTE — Evaluation (Signed)
Occupational Therapy Evaluation Patient Details Name: Jeremiah [name] MRN: [id number] DOB: [birth year] Today's Date: 11/10/2018    History of Present Illness Pt is a 72 y.o. male admitted 11/03/18 with RLE pain and AMS; worked up for sepsis. Xray without osteomyelitis. Head CT without acute abnormality. 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 PMH includes R foot ulcer, DM2, PVD, HTN, CHF, arthritis.   Clinical Impression   Pt with decline in function and safety with ADLs and ADL mobility with decreased strength, balance, endurance and safety awareness. Pt requires mod - max A +2 for mobility and max A - total A with LB ADLs. Pt would benefit from acute OT services to address impairments to maximize level of function and safety    Follow Up Recommendations  CIR    Equipment Recommendations  Other (comment)(TBD at next venue of care)    Recommendations for Other Services       Precautions / Restrictions Precautions Precautions: Fall Restrictions Weight Bearing Restrictions: Yes RLE Weight Bearing: Non weight bearing      Mobility Bed Mobility Overal bed mobility: Needs Assistance Bed Mobility: Supine to Sit     Supine to sit: Mod assist;+2 for physical assistance;HOB elevated     General bed mobility comments: ModA+2 to assist trunk elevation and assist hips to EOB. Pt able to initiate BLE hip movements towards EOB with cues demonstrate R hip flexion  Transfers Overall transfer level: Needs assistance Equipment used: Rolling walker (2 wheeled);2 person hand held assist Transfers: Sit to/from [employer] Sit to Stand: Mod assist;+2 physical assistance;From elevated surface;Max assist Stand pivot transfers: Max assist;+2 physical assistance;From elevated surface       General transfer comment: Mod-maxA+2 to assist trunk elevation and cue hip/trunk extension standing at EOB with RW. Able to perform second standing trial to pivot to recliner with BUE support and maxA+2. Pt  with difficulty engaging LLE to support body weight    Balance Overall balance assessment: Needs assistance Sitting-balance support: No upper extremity supported Sitting balance-Leahy Scale: Fair     Standing balance support: Bilateral upper extremity supported;During functional activity Standing balance-Leahy Scale: Poor                             ADL either performed or assessed with clinical judgement   ADL Overall ADL's : Needs assistance/impaired Eating/Feeding: Independent;Sitting   Grooming: Wash/dry hands;Wash/dry face;Min guard;Sitting   Upper Body Bathing: Minimal assistance;Sitting   Lower Body Bathing: Maximal assistance;Sitting/lateral leans   Upper Body Dressing : Minimal assistance;Sitting   Lower Body Dressing: Maximal assistance;Sitting/lateral leans   Toilet Transfer: Maximal assistance;Moderate assistance;+2 for physical assistance;+2 for safety/equipment;Cueing for safety Toilet Transfer Details (indicate cue type and reason): simulated to recliner Toileting- Clothing Manipulation and Hygiene: Total assistance       Functional mobility during ADLs: Moderate assistance;Maximal assistance;+2 for physical assistance;+2 for safety/equipment;Cueing for safety       Vision Baseline Vision/History: Wears glasses Patient Visual Report: No change from baseline       Perception     Praxis      Pertinent Vitals/Pain Pain Assessment: 0-10 Pain Score: 5  Faces Pain Scale: Hurts little more Pain Location: R residual limb Pain Descriptors / Indicators: Discomfort;Guarding;Grimacing Pain Intervention(s): Limited activity within patient's tolerance;Monitored during session;Repositioned     Hand Dominance Right   Extremity/Trunk Assessment Upper Extremity Assessment Upper Extremity Assessment: Overall WFL for tasks assessed   Lower Extremity Assessment Lower Extremity Assessment:  Defer to PT evaluation       Communication  Communication Communication: No difficulties   Cognition Arousal/Alertness: Awake/alert Behavior During Therapy: Flat affect Overall Cognitive Status: Impaired/Different from baseline Area of Impairment: Attention;Memory;Following commands;Safety/judgement;Awareness;Problem solving                   Current Attention Level: Selective Memory: Decreased short-term memory Following Commands: Follows one step commands inconsistently;Follows one step commands with increased time Safety/Judgement: Decreased awareness of safety;Decreased awareness of deficits Awareness: Intellectual Problem Solving: Slow processing;Decreased initiation;Difficulty sequencing;Requires verbal cues;Requires tactile cues     General Comments       Exercises     Shoulder Instructions      Home Living Family/patient expects to be discharged to:: Private residence Living Arrangements: Spouse/significant other Available Help at Discharge: Family;Available 24 hours/day Type of Home: House Home Access: Stairs to enter [employer] of Steps: 5 Entrance Stairs-Rails: Right;Left Home Layout: One level     Bathroom Shower/Tub: Walk-in shower         Home Equipment: Environmental consultant - 2 wheels;Shower seat;Cane - single point;Bedside commode          Prior Functioning/Environment Level of Independence: Needs assistance  Gait / Transfers Assistance Needed: Able to perform household ambulation with RW ADL's / Homemaking Assistance Needed: Wife assists with sponge bathing at sink and other ADLs as needed (wife herself does HD MWF)            OT Problem List: Decreased strength;Decreased activity tolerance;Impaired balance (sitting and/or standing);Decreased safety awareness;Pain;Decreased knowledge of use of DME or AE;Decreased cognition      OT Treatment/Interventions: Self-care/ADL training;Balance training;Therapeutic exercise;DME and/or AE instruction;Therapeutic activities;Patient/family  education    OT Goals(Current goals can be found in the care plan section) Acute Rehab OT Goals Patient Stated Goal: Get stronger OT Goal Formulation: With patient Time For Goal Achievement: 11/24/18 Potential to Achieve Goals: Good ADL Goals Pt Will Perform Grooming: with set-up;with supervision;sitting Pt Will Perform Upper Body Bathing: with min guard assist;with supervision;with set-up;sitting Pt Will Perform Lower Body Bathing: with mod assist;sitting/lateral leans Pt Will Perform Upper Body Dressing: with min guard assist;with supervision;with set-up;sitting Pt Will Transfer to Toilet: with mod assist;stand pivot transfer;bedside commode  OT Frequency: Min 2X/week   Barriers to D/C: Decreased caregiver support          Co-evaluation PT/OT/SLP Co-Evaluation/Treatment: Yes Reason for Co-Treatment: Complexity of the patient's impairments (multi-system involvement);Necessary to address cognition/behavior during functional activity;For patient/therapist safety;To address functional/ADL transfers PT goals addressed during session: Mobility/safety with mobility OT goals addressed during session: ADL's and self-care;Proper use of Adaptive equipment and DME      AM-PAC OT "6 Clicks" Daily Activity     Outcome Measure Help from another person eating meals?: None Help from another person taking care of personal grooming?: A Little Help from another person toileting, which includes using toliet, bedpan, or urinal?: Total Help from another person bathing (including washing, rinsing, drying)?: A Lot Help from another person to put on and taking off regular upper body clothing?: A Little Help from another person to put on and taking off regular lower body clothing?: A Lot 6 Click Score: 15   End of Session Equipment Utilized During Treatment: Gait belt;Rolling walker  Activity Tolerance: Patient tolerated treatment well Patient left: in chair;with call bell/phone within reach;with  chair alarm set  OT Visit Diagnosis: Other abnormalities of gait and mobility (R26.89);Muscle weakness (generalized) (M62.81);Pain Pain - Right/Left: Right Pain - part of body: (  residual limb)                Time: [id number] OT Time Calculation (min): 23 min Charges:  OT General Charges $OT Visit: 1 Visit OT Evaluation $OT Eval Moderate Complexity: 1 Mod    Galen ManilaSpencer, Dimond Crotty Jeanette 11/10/2018, 12:40 PM

## 2018-11-10 NOTE — Plan of Care (Signed)
  Problem: Activity: Goal: Risk for activity intolerance will decrease Outcome: Progressing   Problem: Nutrition: Goal: Adequate nutrition will be maintained Outcome: Progressing   Problem: Pain Managment: Goal: General experience of comfort will improve Outcome: Progressing   Problem: Safety: Goal: Ability to remain free from injury will improve Outcome: Progressing   

## 2018-11-10 NOTE — Progress Notes (Signed)
Focus of session on lateral transfers from drop arm recliner to bed with +2 assist and bed level mobility for pericare following bowel incontinence. Pt tolerated activity well.   11/10/18 1613  OT Visit Information  Last OT Received On 11/10/18  Assistance Needed +2  PT/OT/SLP Co-Evaluation/Treatment Yes  Reason for Co-Treatment For patient/therapist safety  OT goals addressed during session ADL's and self-care  History of Present Illness Pt is a 72 y.o. male admitted 11/03/18 with RLE pain and AMS; worked up for sepsis. Xray without osteomyelitis. Head CT without acute abnormality. 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 PMH includes R foot ulcer, DM2, PVD, HTN, CHF, arthritis.  Precautions  Precautions Fall  Pain Assessment  Faces Pain Scale 4  Pain Location R residual limb  Pain Descriptors / Indicators Discomfort;Guarding;Grimacing  Pain Intervention(s) Monitored during session  Cognition  Arousal/Alertness Awake/alert  Behavior During Therapy Flat affect  Overall Cognitive Status Impaired/Different from baseline  Area of Impairment Attention;Memory;Following commands;Safety/judgement;Awareness;Problem solving  Current Attention Level Selective  Memory Decreased short-term memory  Following Commands Follows one step commands inconsistently;Follows one step commands with increased time  Safety/Judgement Decreased awareness of safety;Decreased awareness of deficits  Awareness Intellectual  Problem Solving Slow processing;Decreased initiation;Difficulty sequencing;Requires verbal cues;Requires tactile cues  General Comments pt with incontinence of bowel  ADL  Overall ADL's  Needs assistance/impaired  Toileting- Clothing Manipulation and Hygiene Total assistance;Bed level  Bed Mobility  Overal bed mobility Needs Assistance  Bed Mobility Rolling;Sit to Supine  Rolling Mod assist  Sit to supine Min assist  General bed mobility comments returned  to supine with assist for L LE, rolled with mod  assist and encouragement  Balance  Overall balance assessment Needs assistance  Sitting balance-Leahy Scale Fair  Sitting balance - Comments worked on reaching out of BoS  Transfers  Overall transfer level Needs assistance  Equipment used None  Transfers Lateral/Scoot Transfers   Lateral/Scoot Transfers Mod assist;+2 safety/equipment  General transfer comment Lateral scoot transfer from drop arm recliner to bed with modA+2 (use of bed pad); pt able to assist well with BUE support, requiring intermittent cues for LLE setup with foot on floor to assist. Pt incontinent of bowel upon return to bed, returned to supine for pericare  OT - End of Session  Equipment Utilized During Treatment Gait belt  Activity Tolerance Patient tolerated treatment well  Patient left in bed;with call bell/phone within reach;with bed alarm set;with family/visitor present  Nurse Communication Mobility status  OT Assessment/Plan  OT Plan Discharge plan remains appropriate  OT Visit Diagnosis Other abnormalities of gait and mobility (R26.89);Muscle weakness (generalized) (M62.81);Pain  OT Frequency (ACUTE ONLY) Min 2X/week  Follow Up Recommendations CIR  OT Equipment Other (comment)  AM-PAC OT "6 Clicks" Daily Activity Outcome Measure (Version 2)  Help from another person eating meals? 4  Help from another person taking care of personal grooming? 3  Help from another person toileting, which includes using toliet, bedpan, or urinal? 1  Help from another person bathing (including washing, rinsing, drying)? 2  Help from another person to put on and taking off regular upper body clothing? 3  Help from another person to put on and taking off regular lower body clothing? 2  6 Click Score 15  Acute Rehab OT Goals  Patient Stated Goal Get stronger  OT Goal Formulation With patient  Time For Goal Achievement 11/24/18  Potential to Achieve Goals Good  OT Time Calculation  OT Start Time (ACUTE ONLY) 1459  OT Stop Time  (  ACUTE ONLY) 1520  OT Time Calculation (min) 21 min  OT General Charges  $OT Visit 1 Visit  OT Treatments  $Therapeutic Activity 8-22 mins  Martie Round, OTR/L Acute Rehabilitation Services Pager: [phone] Office: [phone]

## 2018-11-10 NOTE — Progress Notes (Signed)
Orthopedic Tech Progress Note Patient Details:  Curley SpiceWalter L Crampton 1946/09/22 [id number]  Patient ID: Curley SpiceWalter L Murthy, male   DOB: 1946/09/22, 72 y.o.   MRN: [id number] Called in order to [employer]   Trinna PostMartinez, Kimberlin Scheel J 11/10/2018, 11:25 AM

## 2018-11-10 NOTE — Progress Notes (Signed)
Subjective:  Jeremiah [name] is a 72 y.o. with PMH of diastolic CHF, PVd, HTn, recurrent C.diff, gout, and T2DM admit for right foot infection s/p AKA on hospital day 7  Jeremiah [name] was examined and evaluated at bedside this AM. He was observed eating breakfast in bed. He states he feels fine and has some mild pain at surgical site but is tolerating well. States it is improved compared to pre-AKA. He states he feels fine and has no complaints and would like to go home. He denies any F/N/V. Have urinated and passing flatus.  Objective:  Vital signs in last 24 hours: Vitals:   11/09/18 0940 11/09/18 0958 11/09/18 2040 11/10/18 0428  BP:  (!) 156/76 (!) 106/58 122/60  Pulse: 62 70 (!) 53 72  Resp: 18 20 20 20   Temp:  98 F (36.7 C) (!) 97.3 F (36.3 C) (!) 97.5 F (36.4 C)  TempSrc:  Oral Oral Oral  SpO2: 97% 99% 99% 99%  Weight:      Height:       Physical Exam  Constitutional: He is oriented to person, place, and time and well-developed, well-nourished, and in no distress. No distress.  HENT:  Head: Normocephalic and atraumatic.  Mouth/Throat: No oropharyngeal exudate.  Eyes: Pupils are equal, round, and reactive to light. Conjunctivae are normal. No scleral icterus.  Neck: Normal range of motion. Neck supple. No JVD present.  Cardiovascular: Normal rate and regular rhythm.  Murmur (3/6 systolic murmur) heard. Pulmonary/Chest: Effort normal. He has rales (bibasilar rales).  Abdominal: Soft. Bowel sounds are normal. There is no abdominal tenderness.  Musculoskeletal: Normal range of motion.        General: Deformity (R AKA site wrapped in bandaging) present. No edema.  Neurological: He is alert and oriented to person, place, and time. GCS score is 15.  Skin: Skin is warm and dry. He is not diaphoretic.   Assessment/Plan:  Active Problems:   Sepsis (HCC)   Fever   Open wound of right foot   Pleural effusion  Jeremiah [name] is a 72 y.o. with PMH of diastolic CHF, PVD,  HTN, recurrent C.Diff, gout, and T2DM admit for infected R foot ulcer. He tolerated his AKA yesterday well and appears to be recovering. Concerns for post-operative bleed as hgb dropped from 9.7 last night to 8.0 after 1 unit of blood but 8.0 is close to his baseline. No obvious sign of hematoma on exam. Will trend CBC to ensure he is not having significant bleed.   Post-operative anemia on chronic iron deficiency anemia Hemoglobin trend post 1 unit pRBC yesterday: 9.7->8.0 Concerns for post-operative bleed forming hematoma but no obvious mass. - F/u repeat CBC: 8.2 - Trend CBC - Continuing to hold iron while getting abx in case of bacteremia  Sepsis 2/2 infected foot s/p AKA Afebrile overnight. WBC trend post-op 20.5->19.1->18.6 Pan-sensitive proteus mirabilis on culture. - C/w ampicillin 2g IV daily (day 7/8) until 11/11/18 - Appreciate PM&R recs - Pt/OT eval  R Pleural Effusion w/ underlying RML nodule Exudative pleural effusion (LDH pleura / serum LDH > 0.6) with cytology showing reactive mesothelial cells and abundant lymphocytes. -F/u with pulm as outpatient  Peripheral Vascular Disease - C/w home meds: simvastatin 40mg  daily, aspirin 81mg  daily  T2DM - Glucose checks - SSI  HTN - c/w home meds: metoprolol succinate 50mg  daily, losartan 100mg  daily  DVT prophx: subqhep Diet: diabetic Bowel: senokot Code: Full  Dispo: Anticipated discharge in approximately 1-2 day(s).   Nedra Hai,  Artist PaisJoshua K, MD 11/10/2018, 1:24 PM Pager: [phone]

## 2018-11-10 NOTE — Progress Notes (Addendum)
  Progress Note    11/10/2018 9:24 AM 1 Day Post-Op  Subjective:  Says he's not having any pain  Afebrile 100's-120's systolic HR 70's NSR 99% RA Vitals:   11/09/18 2040 11/10/18 0428  BP: (!) 106/58 122/60  Pulse: (!) 53 72  Resp: 20 20  Temp: (!) 97.3 F (36.3 C) (!) 97.5 F (36.4 C)  SpO2: 99% 99%    Physical Exam: Incisions:  Bandage is clean and intact   CBC    Component Value Date/Time   WBC 19.1 (H) 11/10/2018 0438   RBC 2.99 (L) 11/10/2018 0438   HGB 8.0 (L) 11/10/2018 0438   HGB 8.9 (L) 10/26/2018 1215   HCT 24.4 (L) 11/10/2018 0438   HCT 27.2 (L) 10/26/2018 1215   PLT 353 11/10/2018 0438   PLT 264 10/26/2018 1215   MCV 81.6 11/10/2018 0438   MCV 80 10/26/2018 1215   MCH 26.8 11/10/2018 0438   MCHC 32.8 11/10/2018 0438   RDW 14.3 11/10/2018 0438   RDW 14.6 10/26/2018 1215   LYMPHSABS 1.2 11/03/2018 1300   LYMPHSABS 3.2 (H) 10/26/2018 1215   MONOABS 1.6 (H) 11/03/2018 1300   EOSABS 0.1 11/03/2018 1300   EOSABS 1.8 (H) 10/26/2018 1215   BASOSABS 0.0 11/03/2018 1300   BASOSABS 0.1 10/26/2018 1215    BMET    Component Value Date/Time   NA 133 (L) 11/10/2018 0438   NA 138 10/26/2018 1215   K 4.1 11/10/2018 0438   CL 100 11/10/2018 0438   CO2 25 11/10/2018 0438   GLUCOSE 159 (H) 11/10/2018 0438   BUN 17 11/10/2018 0438   BUN 15 10/26/2018 1215   CREATININE 1.35 (H) 11/10/2018 0438   CALCIUM 7.8 (L) 11/10/2018 0438   GFRNONAA 51 (L) 11/10/2018 0438   GFRAA 60 (L) 11/10/2018 0438    INR    Component Value Date/Time   INR 1.20 11/08/2018 0934     Intake/Output Summary (Last 24 hours) at 11/10/2018 0924 Last data filed at 11/10/2018 0441 Gross per 24 hour  Intake 1570 ml  Output 25 ml  Net 1545 ml     Assessment/Plan:  72 y.o. male is s/p right above knee amputation  1 Day Post-Op  -pt doing well this morning without pain -acute blood loss anemia with hgb 8.0-transfuse per primary team -leukocytosis improving -awaiting PT  consult and formal CIR consult -will consult [employer] for stump sock once dressing is removed tomorrow.   Doreatha MassedSamantha Rhyne, PA-C [facility] [phone] 11/10/2018 9:24 AM  I have independently interviewed and examined patient and agree with PA assessment and plan above.   Loni Abdon C. Randie Heinzain, MD [facility] [place] Office: [phone] Pager: [phone]

## 2018-11-11 DIAGNOSIS — Z8619 Personal history of other infectious and parasitic diseases: Secondary | ICD-10-CM

## 2018-11-11 LAB — CBC
HCT: 23.4 % — ABNORMAL LOW (ref 39.0–52.0)
Hemoglobin: 7.8 g/dL — ABNORMAL LOW (ref 13.0–17.0)
MCH: 27.9 pg (ref 26.0–34.0)
MCHC: 33.3 g/dL (ref 30.0–36.0)
MCV: 83.6 fL (ref 80.0–100.0)
Platelets: 393 10*3/uL (ref 150–400)
RBC: 2.8 MIL/uL — ABNORMAL LOW (ref 4.22–5.81)
RDW: 14.4 % (ref 11.5–15.5)
WBC: 13.9 10*3/uL — ABNORMAL HIGH (ref 4.0–10.5)
nRBC: 0 % (ref 0.0–0.2)

## 2018-11-11 LAB — GLUCOSE, CAPILLARY
Glucose-Capillary: 111 mg/dL — ABNORMAL HIGH (ref 70–99)
Glucose-Capillary: 149 mg/dL — ABNORMAL HIGH (ref 70–99)
Glucose-Capillary: 213 mg/dL — ABNORMAL HIGH (ref 70–99)
Glucose-Capillary: 113 mg/dL — ABNORMAL HIGH (ref 70–99)

## 2018-11-11 LAB — BASIC METABOLIC PANEL
Anion gap: 11 (ref 5–15)
BUN: 15 mg/dL (ref 8–23)
CO2: 22 mmol/L (ref 22–32)
Calcium: 7.7 mg/dL — ABNORMAL LOW (ref 8.9–10.3)
Chloride: 104 mmol/L (ref 98–111)
Creatinine, Ser: 1.05 mg/dL (ref 0.61–1.24)
GFR calc Af Amer: >60 mL/min (ref >60)
GFR calc non Af Amer: >60 mL/min (ref >60)
Glucose, Bld: 91 mg/dL (ref 70–99)
Potassium: 3.8 mmol/L (ref 3.5–5.1)
Sodium: 137 mmol/L (ref 135–145)

## 2018-11-11 NOTE — Progress Notes (Signed)
IP rehab admissions - I met with patient today.  I gave him rehab booklets and discussed inpatient rehab services.  I await call back from insurance carrier regarding potential acute inpatient rehab admission.  Call me for questions.  [phone]

## 2018-11-11 NOTE — Progress Notes (Addendum)
[facility] [place]  Subjective  - Very little rest pain in right AKA.   Objective (!) 144/52 71 98.3 F (36.8 C) (Oral) 18 98%  Intake/Output Summary (Last 24 hours) at 11/11/2018 0751 Last data filed at 11/11/2018 0600 Gross per 24 hour  Intake 2169 ml  Output 1201 ml  Net 968 ml    Dressing changed right AKA stump healing well minimal pin point bloody drainage.  Assessment/Planning: POD # 2 right AKA  Stump appears viable. Stump sock placed over clean dry dressing. Pending CIR verses SNF placement. F/U in our office for staple removal in 4 weeks.  Mosetta Pigeonmma Maureen Collins 11/11/2018 7:51 AM --  Laboratory Lab Results: Recent Labs    11/10/18 1045 11/11/18 0400  WBC 18.6* 13.9*  HGB 8.2* 7.8*  HCT 24.0* 23.4*  PLT 383 393   BMET Recent Labs    11/10/18 0438 11/11/18 0400  NA 133* 137  K 4.1 3.8  CL 100 104  CO2 25 22  GLUCOSE 159* 91  BUN 17 15  CREATININE 1.35* 1.05  CALCIUM 7.8* 7.7*    COAG Lab Results  Component Value Date   INR 1.20 11/08/2018   INR 1.01 01/31/2018   No results found for: PTT  I have seen and evaluated the patient. I agree with the PA note as documented above. POD#2 s/p R AKA with dressing down today.  Wound looks good today overall.  Stump sock applied.  Cephus Shellinghristopher J. Kaleigha Chamberlin, MD [facility] [place] Office: [phone] Pager: [phone]

## 2018-11-11 NOTE — Progress Notes (Signed)
   Subjective:  Jeremiah [name] is a 72 y.o. with PMH of diastolic CHF, PVd, HTn, recurrent C.diff, gout, and T2DM admit for right foot infection s/p AKA on hospital day 8  Jeremiah [name] was examined and evaluated at bedside this AM. He was observed laying comfortably. He states he feels fine and denies any fevers/chills/nausea/vomiting/diarrhea/constipation. He states he sometimes forgets that his leg is missing. Continues to endorse some pain but states is tolerable.  Objective:  Vital signs in last 24 hours: Vitals:   11/10/18 1408 11/10/18 1945 11/11/18 0300 11/11/18 0420  BP: (!) 154/65 133/60  (!) 144/52  Pulse: (!) 58 (!) 55  71  Resp: 20 18  18   Temp: (!) 97.4 F (36.3 C) (!) 97.5 F (36.4 C)  98.3 F (36.8 C)  TempSrc: Oral Oral  Oral  SpO2: 98% 97%  98%  Weight:   75.2 kg   Height:       Physical Exam  Constitutional: He is oriented to person, place, and time and well-developed, well-nourished, and in no distress. No distress.  HENT:  Head: Normocephalic and atraumatic.  Mouth/Throat: Oropharynx is clear and moist. No oropharyngeal exudate.  Eyes: Pupils are equal, round, and reactive to light. Conjunctivae and EOM are normal. No scleral icterus.  Neck: Normal range of motion. Neck supple.  Cardiovascular: Normal rate, regular rhythm and intact distal pulses.  Murmur (3/6 systolic murmur) heard. Pulmonary/Chest: Effort normal and breath sounds normal. He has no rales.  Abdominal: Soft. Bowel sounds are normal. He exhibits no distension. There is no abdominal tenderness.  Musculoskeletal: Normal range of motion.        General: Deformity (R AKA stump in compression) present. No edema.  Neurological: He is alert and oriented to person, place, and time. No cranial nerve deficit. GCS score is 15.  Skin: Skin is warm and dry. He is not diaphoretic.   Assessment/Plan:  Active Problems:   Sepsis (HCC)   Fever   Open wound of right foot   Pleural effusion  Jeremiah [name]  [place] a 72 y.o.with PMH of diastolic CHF, PVD, HTN, recurrent C.Diff, gout, and T2DMadmit for infected R foot ulcer. He is stable post op. Getting last dose of abx today. Awaiting insurance approval for inpatient rehab  Post-operative anemia on chronic iron deficiency anemia Hemoglobin stable but may still be oozing. Will trend and follow - Trend CBC: 8.0->8.2->7.8 - Continuing to hold iron while getting abx in case of bacteremia  Sepsis 2/2 infected foot s/p AKA WBC trend post-op 20.5->19.1->18.6->13.6 Leukocytosis almost resolved. Getting last dose of ampicillin today.. - C/w ampicillin 2g IV daily (day 7/8) until 11/11/18 - Appreciate PM&R recs - Pt/OT eval  R Pleural Effusion w/ underlying RML nodule Exudative pleural effusion (LDH pleura / serum LDH > 0.6) with cytology showing reactive mesothelial cells and abundant lymphocytes.  -F/u with pulm as outpatient  Peripheral Vascular Disease - C/w home meds: simvastatin 40mg  daily, aspirin 81mg  daily  T2DM - Glucose checks - SSI  HTN - c/w home meds: metoprolol succinate 50mg  daily, losartan 100mg  daily  DVT prophx: subqhep Diet: Diabetic Bowel: Senokot Code: Full  Dispo: Anticipated discharge in approximately 1-2 day(s).   Jeremiah [name], Jeremiah Streater K, MD 11/11/2018, 6:55 AM Pager: [phone]

## 2018-11-11 NOTE — Progress Notes (Signed)
  Date: 11/11/2018  Patient name: Jeremiah SpiceWalter L Rueth  Medical record number: [id number]  Date of birth: Sep 05, 1946   I have seen and evaluated this patient and I have discussed the plan of care with the house staff. Please see Dr. Marigene EhlersLee's note for complete details. I concur with his findings with the following additions/corrections:   Today was last day of Abx.  He completed a course for GNR bacteremia.  Would monitor for any repeat fever, increased WBC or other signs of recurrence of infection.   Inez CatalinaMullen, Emily B, MD 11/11/2018, 8:05 PM

## 2018-11-12 DIAGNOSIS — I10 Essential (primary) hypertension: Secondary | ICD-10-CM

## 2018-11-12 DIAGNOSIS — S78111A Complete traumatic amputation at level between right hip and knee, initial encounter: Secondary | ICD-10-CM

## 2018-11-12 DIAGNOSIS — E119 Type 2 diabetes mellitus without complications: Secondary | ICD-10-CM

## 2018-11-12 DIAGNOSIS — I169 Hypertensive crisis, unspecified: Secondary | ICD-10-CM

## 2018-11-12 DIAGNOSIS — D72829 Elevated white blood cell count, unspecified: Secondary | ICD-10-CM

## 2018-11-12 DIAGNOSIS — Z89619 Acquired absence of unspecified leg above knee: Secondary | ICD-10-CM

## 2018-11-12 LAB — CBC
HCT: 22.5 % — ABNORMAL LOW (ref 39.0–52.0)
Hemoglobin: 7.4 g/dL — ABNORMAL LOW (ref 13.0–17.0)
MCH: 27.2 pg (ref 26.0–34.0)
MCHC: 32.9 g/dL (ref 30.0–36.0)
MCV: 82.7 fL (ref 80.0–100.0)
Platelets: 395 10*3/uL (ref 150–400)
RBC: 2.72 MIL/uL — ABNORMAL LOW (ref 4.22–5.81)
RDW: 14.4 % (ref 11.5–15.5)
WBC: 14 10*3/uL — ABNORMAL HIGH (ref 4.0–10.5)
nRBC: 0 % (ref 0.0–0.2)

## 2018-11-12 LAB — GLUCOSE, CAPILLARY
Glucose-Capillary: 137 mg/dL — ABNORMAL HIGH (ref 70–99)
Glucose-Capillary: 148 mg/dL — ABNORMAL HIGH (ref 70–99)
Glucose-Capillary: 141 mg/dL — ABNORMAL HIGH (ref 70–99)
Glucose-Capillary: 89 mg/dL (ref 70–99)

## 2018-11-12 MED ORDER — FERROUS SULFATE 325 (65 FE) MG PO TABS
325.0000 mg | ORAL_TABLET | Freq: Three times a day (TID) | ORAL | Status: DC
  Administered 2018-11-12 – 2018-11-15 (×10): 325 mg via ORAL
  Filled 2018-11-12 (×10): qty 1

## 2018-11-12 NOTE — Progress Notes (Signed)
Occupational Therapy Treatment Patient Details Name: Jeremiah [name] MRN: [id number] DOB: 16-Dec-1945 Today's Date: 11/12/2018    History of present illness Pt is a 72 y.o. male admitted 11/03/18 with RLE pain and AMS; worked up for sepsis. Xray without osteomyelitis. Head CT without acute abnormality. 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 PMH includes R foot ulcer, DM2, PVD, HTN, CHF, arthritis.   OT comments  Pt making slow progress with functional goals. Pt performed supine to sit with Max A+2 for initiation, hip and trunk control, sit to stand with RW total A+ 2, however unable to fully extend hips. Pt required Squat pivot with total A+ 2 to the recliner. Pt and his son educated on importance of not putting pillow under residual limb and participating in as much of his selfcare and mobility a possible and what will be expected at CIR or a SNF from a rehab perspective In order to restore PLOF to return home safely at max potential. Reviewed compensatory techniques for LB bathing and dressing and had pt to simulate.  OT will continue to follow acutely   Follow Up Recommendations  CIR(SNF if cannot get to CIR)    Equipment Recommendations  Other (comment)(TBD at next venue of care)    Recommendations for Other Services      Precautions / Restrictions Precautions Precautions: Fall Restrictions Weight Bearing Restrictions: Yes RLE Weight Bearing: Non weight bearing       Mobility Bed Mobility Overal bed mobility: Needs Assistance Bed Mobility: Supine to Sit     Supine to sit: Max assist;+2 for physical assistance     General bed mobility comments: Difficulty initiating movement with unaffected limb or arms. Max A for progressing of leg, shifting hips and trunk control.   Transfers Overall transfer level: Needs assistance Equipment used: Rolling walker (2 wheeled) Transfers: Sit to/from [employer] CorporationStand;Squat Pivot Transfers Sit to Stand: Total assist;+2 physical assistance   Squat pivot transfers:  +2 physical assistance;Total assist     General transfer comment: Total A +2 for sit to stand with RW, no pt initiation, strong posterior lean. Total A+2 for squat pivot, no pt initiation.     Balance Overall balance assessment: Needs assistance Sitting-balance support: Bilateral upper extremity supported;Feet supported Sitting balance-Leahy Scale: Poor Sitting balance - Comments: Pt needs VC and B UE to support self EOB with minA, strong posterior lean    Standing balance support: During functional activity;Bilateral upper extremity supported Standing balance-Leahy Scale: Zero Standing balance comment: Total A+2 for sit to stand with RW, pt unable to contribute to stand                           ADL either performed or assessed with clinical judgement   ADL Overall ADL's : Needs assistance/impaired     Grooming: Wash/dry hands;Wash/dry face;Sitting;Supervision/safety;Set up Grooming Details (indicate cue type and reason): supported sitting in recliner. Poor siting balance unsuported at EOB Upper Body Bathing: Minimal assistance;Sitting Upper Body Bathing Details (indicate cue type and reason): simulated Lower Body Bathing: Maximal assistance;Sitting/lateral leans;Moderate assistance Lower Body Bathing Details (indicate cue type and reason): simulated Upper Body Dressing : Sitting;Min guard       Toilet Transfer: Squat-pivot;Total assistance;+2 for physical assistance;Cueing for safety Toilet Transfer Details (indicate cue type and reason): simulated to recliner Toileting- Clothing Manipulation and Hygiene: Total assistance;Bed level       Functional mobility during ADLs: Total assistance;+2 for physical assistance;Cueing for safety  Vision Baseline Vision/History: Wears glasses Patient Visual Report: No change from baseline     Perception     Praxis      Cognition Arousal/Alertness: Awake/alert Behavior During Therapy: WFL for tasks  assessed/performed Overall Cognitive Status: Impaired/Different from baseline Area of Impairment: Attention;Memory;Following commands;Safety/judgement;Awareness;Problem solving                   Current Attention Level: Selective Memory: Decreased short-term memory Following Commands: Follows one step commands inconsistently;Follows one step commands with increased time Safety/Judgement: Decreased awareness of safety Awareness: Intellectual Problem Solving: Decreased initiation;Requires verbal cues;Requires tactile cues;Slow processing General Comments: Pt taught 2 exercises, tested recall 2 min later and could remember nothing related to exercises. Pt unable to initiate with mobility, even with unaffected limbs.         Exercises Amputee Exercises Gluteal Sets: AROM;Seated;10 reps;Right Hip Extension: Right;AROM;10 reps;Seated   Shoulder Instructions       General Comments Son present. Condom cath fell off, nurse alerted.     Pertinent Vitals/ Pain       Pain Assessment: 0-10 Pain Score: 8  Pain Location: R residual limb Pain Descriptors / Indicators: Discomfort;Guarding;Grimacing Pain Intervention(s): Premedicated before session;Monitored during session;Repositioned  Home Living                                          Prior Functioning/Environment              Frequency  Min 2X/week        Progress Toward Goals  OT Goals(current goals can now be found in the care plan section)  Progress towards OT goals: Progressing toward goals  Acute Rehab OT Goals Patient Stated Goal: Get stronger  Plan Discharge plan remains appropriate    Co-evaluation    PT/OT/SLP Co-Evaluation/Treatment: Yes Reason for Co-Treatment: To address functional/ADL transfers;For patient/therapist safety PT goals addressed during session: Mobility/safety with mobility OT goals addressed during session: ADL's and self-care;Proper use of Adaptive equipment and  DME      AM-PAC OT "6 Clicks" Daily Activity     Outcome Measure   Help from another person eating meals?: None Help from another person taking care of personal grooming?: A Little Help from another person toileting, which includes using toliet, bedpan, or urinal?: Total Help from another person bathing (including washing, rinsing, drying)?: A Lot Help from another person to put on and taking off regular upper body clothing?: A Little Help from another person to put on and taking off regular lower body clothing?: A Lot 6 Click Score: 15    End of Session Equipment Utilized During Treatment: Gait belt  OT Visit Diagnosis: Other abnormalities of gait and mobility (R26.89);Muscle weakness (generalized) (M62.81);Pain Pain - Right/Left: Right Pain - part of body: (residual limb)   Activity Tolerance Patient tolerated treatment well   Patient Left with call bell/phone within reach;with family/visitor present;in chair   Nurse Communication          Time: [id number] OT Time Calculation (min): 32 min  Charges: OT General Charges $OT Visit: 1 Visit OT Treatments $Therapeutic Activity: 8-22 mins     Galen ManilaSpencer, Gila Lauf Jeanette 11/12/2018, 2:14 PM

## 2018-11-12 NOTE — Progress Notes (Addendum)
   Pending insurance approval CIR verses SNF placement.  Stump is viable and he has sock from [employer] with dry guaze until no drainage from incision site.  S/P right AKA Jeremiah PigeonEmma Maureen Collins PA-C  I have evaluated the patient appears to be doing well post right above-knee amputation.  Hopefully can transition to rehab.  Vascular surgery will be available as needed.  He has follow-up on January 17 for staple removal.  Jeremiah Massar C. Randie Heinzain, MD [facility] [place] Office: [phone] Pager: [phone]

## 2018-11-12 NOTE — Progress Notes (Signed)
IP rehab admissions - [facility].  I spoke with patient's wife and a son.  Wife and son want to appeal decision.  I have called insurance case manager and have requested a peer to peer review with Well[employer] geologistmedical director and our rehab MD.  Will update all once I hear back from insurance case manager with a decision.  Call me for questions.  [phone]

## 2018-11-12 NOTE — Progress Notes (Signed)
   Subjective:  Jeremiah [name] is a 72 y.o. with PMH of diastolic CHF, PVd, HTn, recurrent C.diff, gout, and T2DM admit for right foot infection s/p AKA on hospital day 9  Jeremiah [name] was examined and evaluated at bedside this AM with son present. He was examined in the process of getting ready for physical therapy. He states his stump is painful but it is tolerable. Denies any F/N/V/D/C. Denies any chest pain, palpitations, shortness of breath.  Objective:  Vital signs in last 24 hours: Vitals:   11/11/18 1946 11/12/18 0405 11/12/18 0924 11/12/18 1159  BP: (!) 165/57 (!) 149/55 (!) 156/60 (!) 151/67  Pulse: 72 67 70 (!) 59  Resp: 18 18  18   Temp: 98.5 F (36.9 C) 98.8 F (37.1 C)  (!) 97.4 F (36.3 C)  TempSrc: Oral Oral  Oral  SpO2: 96% 98% 100% 100%  Weight:  75.5 kg    Height:       Physical Exam  Constitutional: He is oriented to person, place, and time and well-developed, well-nourished, and in no distress. No distress.  HENT:  Head: Normocephalic and atraumatic.  Mouth/Throat: Oropharynx is clear and moist. No oropharyngeal exudate.  Eyes: Pupils are equal, round, and reactive to light. Conjunctivae and EOM are normal. No scleral icterus.  Neck: Normal range of motion. Neck supple.  Cardiovascular: Normal rate, regular rhythm and intact distal pulses.  Murmur (3/6 systolic murmur) heard. Pulmonary/Chest: Effort normal. He has rales (LLL rales. RLL clear).  Abdominal: Soft. Bowel sounds are normal. He exhibits no distension. There is no abdominal tenderness.  Musculoskeletal: Normal range of motion.        General: Deformity (R AKA stump in compression) present. No edema.  Neurological: He is alert and oriented to person, place, and time. No cranial nerve deficit. GCS score is 15.  Skin: Skin is warm and dry. He is not diaphoretic.   Assessment/Plan:  Active Problems:   Sepsis (HCC)   Fever   Open wound of right foot   Pleural effusion  Jeremiah [name] a 72  y.o.with PMH of diastolic CHF, PVD, HTN, recurrent C.Diff, gout, and T2DMadmit for infected R foot ulcer. He has some leukocytosis that is stable off antibiotics which may be just stress response from his surgery. Discussion with social work reveal he needs PM&R provider and insurance physician to have peer-to-peer review for authorization.   Post-operative anemia on chronic iron deficiency anemia Hemoglobin stable but may still be oozing. Iron studies drawn during active infection: Iron 12, TIBc 161, Sat ratio 7%, Ferritin 637 Will trend and follow - Trend CBC: 8.0->8.2->7.8->7.4 - Start ferrous sulfate 325mg  TID  Sepsis 2/2 infected foot s/p AKA WBC trend post-op 20.5->19.1->18.6->13.6->14. Improved leukocytosis after amputation may continue to be elevated due to stress response to surgery - Resolved  R Pleural Effusion w/ underlying RML nodule Exudative pleural effusion (LDH pleura / serum LDH > 0.6) with cytology showing reactive mesothelial cells and abundant lymphocytes.  -F/u with pulm as outpatient  Peripheral Vascular Disease - C/w home meds: simvastatin 40mg  daily, aspirin 81mg  daily  T2DM - Glucose checks - SSI  HTN - c/w home meds: metoprolol succinate 50mg  daily, losartan 100mg  daily  DVT prophx: subqhep Diet: Diabetic Bowel: Senokot Code: Full  Dispo: Anticipated discharge in approximately 1-2 day(s).   [name], Ivey Cina K, MD 11/12/2018, 2:05 PM Pager: [phone]

## 2018-11-12 NOTE — Progress Notes (Signed)
[facility] PHYSICAL MEDICINE & REHABILITATION PROGRESS NOTE  Subjective/Complaints: Patient seen sitting up in his chair this morning.  Family at bedside who answers most of the questions.  Patient states he feels okay.  ROS: Denies CP, S OB, nausea, vomiting, diarrhea.  Objective: Vital Signs: Blood pressure (!) 190/85, pulse 70, temperature 98.3 F (36.8 C), temperature source Oral, resp. rate 18, height 5\' 9"  (1.753 m), weight 75.5 kg, SpO2 99 %. No results found. Recent Labs    11/11/18 0400 11/12/18 0558  WBC 13.9* 14.0*  HGB 7.8* 7.4*  HCT 23.4* 22.5*  PLT 393 395   Recent Labs    11/10/18 0438 11/11/18 0400  NA 133* 137  K 4.1 3.8  CL 100 104  CO2 25 22  GLUCOSE 159* 91  BUN 17 15  CREATININE 1.35* 1.05  CALCIUM 7.8* 7.7*    Physical Exam: BP (!) 190/85 (BP Location: Left Arm)   Pulse 70   Temp 98.3 F (36.8 C) (Oral)   Resp 18   Ht 5\' 9"  (1.753 m)   Wt 75.5 kg   SpO2 99%   BMI 24.57 kg/m  Constitutional: No distress . Vital signs reviewed. HENT: Normocephalic.  Atraumatic. Eyes: EOMI. No discharge. Cardiovascular: RRR. No JVD. Respiratory: CTA Bilaterally. Normal effort. GI: BS +. Non-distended. Musc: Right stump with edema and tenderness Neurological: He is alert and oriented  HOH Motor: Bilateral upper extremities: 4/5 proximal distal Right lower extremity: Hip flexion 0/5 Left lower extremity: Hip flexion, knee extension 2 one 5/5, ankle dorsiflexion 3-/5 Skin: Amputation site with dressing C/D/I Psych: Slowed  Assessment/Plan:  1.  Right AKA  Clean amputation daily with soap and water Monitor incision site for signs of infection or impending skin breakdown. Staples to remain in place for 3-4 weeks Stump shrinker, for edema control  Scar mobilization massaging to prevent soft tissue adherence Stump protector during therapies Prevent flexion contractures by implementing the following:   Encourage prone lying for 20-30 mins per day BID  to avoid hip flexion  Contractures if medically appropriate;  Avoid pillow under knees when patient is lying in bed in order to prevent both  knee and hip flexion contractures;  Avoid prolonged sitting Post surgical pain control with oral medication Phantom limb pain control with physical modalities including desensitization techniques (gentle self massage to the residual stump,hot packs if sensation intact, US) and mirror therapy, TENS. If ineffective, consider pharmacological treatment for neuropathic pain (e.g gabapentin, pregabalin, amytriptalyine, duloxetine).  When using wheelchair, patient should have knee on amputated side fully extended with board under the seat cushion. Avoid injury to contralateral side  Awaiting insurance approval for CIR  2. HTN  With hypertensive crisis  Monitor and provide prns in accordance with increased physical exertion and pain  3. DM  Monitor in accordance with exercise and adjust meds as necessary  4.  Leukocytosis  Repeat labs, cont to monitor for signs and symptoms of infection, further workup if indicated  LOS: 9 days A FACE TO FACE EVALUATION WAS PERFORMED  Daryn Hicks Karis Jubanil Onofre Gains 11/12/2018, 9:18 PM

## 2018-11-12 NOTE — Progress Notes (Signed)
  Date: 11/12/2018  Patient name: Jeremiah [name]  Medical record number: [id number]  Date of birth: 16-Feb-1946   I have seen and evaluated this patient and I have discussed the plan of care with the house staff. Please Dr. Marigene EhlersLee's their note for complete details. I concur with his findings with the following additions/corrections:   It appears insurance has initially denied CIR, Peer to Peer is taking place.  If denied, SNF placement is being sought.  Hopefully can be decided in next day or 2.   Inez CatalinaMullen, Heriberto Stmartin B, MD 11/12/2018, 2:26 PM

## 2018-11-12 NOTE — Clinical Social Work Note (Addendum)
Peer-to-peer review pending for CIR insurance denial. [facility] has not responded to referral yet but CSW made them aware of it on Wednesday. CSW asked admissions coordinator to review referral and determine if they can make bed offer in case patient is still denied for CIR.  Charlynn CourtSarah Bristol Soy, CSW [phone]  2:32 pm [facility] is able to offer a bed if patient unable to go to CIR. Will need to get insurance authorization for them as well.  Charlynn CourtSarah Makita Blow, CSW [phone]

## 2018-11-12 NOTE — Progress Notes (Signed)
Physical Therapy Treatment Patient Details Name: Curley SpiceWalter L Cale MRN: [id number] DOB: 07/29/1946 Today's Date: 11/12/2018    History of Present Illness Pt is a 72 y.o. male admitted 11/03/18 with RLE pain and AMS; worked up for sepsis. Xray without osteomyelitis. Head CT without acute abnormality. 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 PMH includes R foot ulcer, DM2, PVD, HTN, CHF, arthritis.    PT Comments    Pt admitted with above diagnosis. Pt presents s/p R AKA with generalized weakness, decreased problem solving and short term memory loss limiting his ability to perform bed mobility, transfers and amb safely and independently. Pt performed supine to sit with MaxA+2 for initiation, hip and trunk control. Pt performed sit to stand with RW total A+2, unable to fully extend hips. Squat pivot with total A+2 to recliner. Pt educated on importance of not putting pillow under residual limb and doing two exercises provided for residual limb strengthening. Concern for pt's ability to tolerate intensity of CIR. Pt will benefit from skilled PT to increase his independence and safety with mobility to allow discharge to CIR.    Follow Up Recommendations  CIR;Supervision for mobility/OOB     Equipment Recommendations  None recommended by PT    Recommendations for Other Services       Precautions / Restrictions Precautions Precautions: Fall Restrictions Weight Bearing Restrictions: Yes RLE Weight Bearing: Non weight bearing    Mobility  Bed Mobility Overal bed mobility: Needs Assistance Bed Mobility: Supine to Sit     Supine to sit: Max assist;+2 for physical assistance     General bed mobility comments: Difficulty initiating movement with unaffected limb or arms. Max A for progressing of leg, shifting hips and trunk control.   Transfers Overall transfer level: Needs assistance Equipment used: Rolling walker (2 wheeled) Transfers: Sit to/from [employer];Squat Pivot Transfers Sit to Stand: Total  assist;+2 physical assistance   Squat pivot transfers: +2 physical assistance;Total assist     General transfer comment: Total A +2 for sit to stand with RW, no pt initiation, strong posterior lean. Total A+2 for squat pivot, no pt initiation.   Ambulation/Gait                 Stairs             Wheelchair Mobility    Modified Rankin (Stroke Patients Only)       Balance Overall balance assessment: Needs assistance Sitting-balance support: Bilateral upper extremity supported;Feet supported Sitting balance-Leahy Scale: Poor Sitting balance - Comments: Pt needs VC and B UE to support self EOB with minA, strong posterior lean    Standing balance support: During functional activity;Bilateral upper extremity supported Standing balance-Leahy Scale: Zero Standing balance comment: Total A+2 for sit to stand with RW, pt unable to contribute to stand                            Cognition Arousal/Alertness: Awake/alert Behavior During Therapy: WFL for tasks assessed/performed Overall Cognitive Status: Impaired/Different from baseline Area of Impairment: Attention;Memory;Following commands;Safety/judgement;Awareness;Problem solving                   Current Attention Level: Selective Memory: Decreased short-term memory Following Commands: Follows one step commands inconsistently;Follows one step commands with increased time Safety/Judgement: Decreased awareness of safety Awareness: Intellectual Problem Solving: Decreased initiation;Requires verbal cues;Requires tactile cues;Slow processing General Comments: Pt taught 2 exercises, tested recall 2 min later and could remember nothing related to exercises.  Pt unable to initiate with mobility, even with unaffected limbs.       Exercises Amputee Exercises Gluteal Sets: AROM;Seated;10 reps;Right Hip Extension: Right;AROM;10 reps;Seated    General Comments General comments (skin integrity, edema, etc.):  Son present. Condom cath fell off, nurse alerted.       Pertinent Vitals/Pain Pain Assessment: 0-10 Pain Score: 8  Pain Location: R residual limb Pain Descriptors / Indicators: Discomfort;Guarding;Grimacing Pain Intervention(s): Premedicated before session;Monitored during session;Repositioned    Home Living                      Prior Function            PT Goals (current goals can now be found in the care plan section) Acute Rehab PT Goals Patient Stated Goal: Get stronger PT Goal Formulation: With patient Time For Goal Achievement: 11/18/18 Potential to Achieve Goals: Good Progress towards PT goals: Not progressing toward goals - comment(pain meds or pt cognition may have limited pt today, pt unable to initiate or assist with transfers)    Frequency    Min 3X/week      PT Plan Current plan remains appropriate    Co-evaluation PT/OT/SLP Co-Evaluation/Treatment: Yes Reason for Co-Treatment: To address functional/ADL transfers;For patient/therapist safety PT goals addressed during session: Mobility/safety with mobility OT goals addressed during session: ADL's and self-care;Proper use of Adaptive equipment and DME      AM-PAC PT "6 Clicks" Mobility   Outcome Measure  Help needed turning from your back to your side while in a flat bed without using bedrails?: A Little Help needed moving from lying on your back to sitting on the side of a flat bed without using bedrails?: A Lot Help needed moving to and from a bed to a chair (including a wheelchair)?: A Lot Help needed standing up from a chair using your arms (e.g., wheelchair or bedside chair)?: Total Help needed to walk in hospital room?: Total Help needed climbing 3-5 steps with a railing? : Total 6 Click Score: 10    End of Session Equipment Utilized During Treatment: Gait belt Activity Tolerance: Patient tolerated treatment well Patient left: in chair;with chair alarm set;with family/visitor  present;with call bell/phone within reach Nurse Communication: Mobility status PT Visit Diagnosis: Other abnormalities of gait and mobility (R26.89);Muscle weakness (generalized) (M62.81)     Time: [id number] PT Time Calculation (min) (ACUTE ONLY): 30 min  Charges:  $Therapeutic Activity: 8-22 mins                     Pattricia BossAnne Ortha Metts, SPT Acute Rehab Services [phone]   Pattricia Bossnne Yanitza Shvartsman 11/12/2018, 1:03 PM

## 2018-11-13 LAB — GLUCOSE, CAPILLARY
Glucose-Capillary: 111 mg/dL — ABNORMAL HIGH (ref 70–99)
Glucose-Capillary: 183 mg/dL — ABNORMAL HIGH (ref 70–99)
Glucose-Capillary: 140 mg/dL — ABNORMAL HIGH (ref 70–99)

## 2018-11-13 LAB — CBC
HCT: 22.8 % — ABNORMAL LOW (ref 39.0–52.0)
Hemoglobin: 7.2 g/dL — ABNORMAL LOW (ref 13.0–17.0)
MCH: 26.2 pg (ref 26.0–34.0)
MCHC: 31.6 g/dL (ref 30.0–36.0)
MCV: 82.9 fL (ref 80.0–100.0)
Platelets: 381 10*3/uL (ref 150–400)
RBC: 2.75 MIL/uL — ABNORMAL LOW (ref 4.22–5.81)
RDW: 14.1 % (ref 11.5–15.5)
WBC: 13.6 10*3/uL — ABNORMAL HIGH (ref 4.0–10.5)
nRBC: 0 % (ref 0.0–0.2)

## 2018-11-13 MED ORDER — LABETALOL HCL 5 MG/ML IV SOLN
5.0000 mg | Freq: Once | INTRAVENOUS | Status: AC
  Administered 2018-11-13: 5 mg via INTRAVENOUS
  Filled 2018-11-13: qty 4

## 2018-11-13 NOTE — Progress Notes (Signed)
Patient has no order for telemetry.  There is a signed and held order for telemetry in PACU but that cannot be released on the floor.  Internal Medicine on call said to continue monitoring for now and reevaluate with primary provider in the morning.

## 2018-11-13 NOTE — Progress Notes (Signed)
Patient relocated to a second room, after finding ants in the first room. terminix treated the previous room, and it was closed for completion of treatment.   Pt agreeable to relocation, requests to go to new room in his existing bed. Wife at bedside for this request without objections.

## 2018-11-13 NOTE — Progress Notes (Signed)
Patient resting comfortably during shift report. Denies complaints.  

## 2018-11-13 NOTE — Progress Notes (Signed)
Call placed to CCMD to notify of telemetry monitoring d/c.   

## 2018-11-13 NOTE — Discharge Summary (Signed)
Name: Jeremiah [name] MRN: [id number] DOB: 07-Jun-1946 72 y.o. PCP: Rinaldo CloudHarwani, Mohan, MD  Date of Admission: 11/03/2018 12:31 PM Date of Discharge:  Attending Physician: Anne Shutteraines, Alexander N, MD  Discharge Diagnosis: 1. R heel infection w/ sepsis 2. Right Middle Lobe Nodule 3. Iron deficiency anemia   Discharge Medications: Allergies as of 11/15/2018   No Known Allergies     Medication List    TAKE these medications   acetaminophen 500 MG tablet Commonly known as:  TYLENOL Take 1,000 mg by mouth every 6 (six) hours as needed for moderate pain or headache.   amLODipine 10 MG tablet Commonly known as:  NORVASC Take 1 tablet (10 mg total) by mouth daily. Start taking on:  November 16, 2018   aspirin 81 MG tablet Take 81 mg by mouth daily.   brimonidine 0.2 % ophthalmic solution Commonly known as:  ALPHAGAN Place 1 drop into both eyes 2 (two) times daily.   cadexomer iodine 0.9 % gel Commonly known as:  IODOSORB Apply 1 application topically daily as needed for wound care.   ferrous sulfate 325 (65 FE) MG tablet Take 1 tablet (325 mg total) by mouth 3 (three) times daily with meals.   glimepiride 4 MG tablet Commonly known as:  AMARYL Take 4 mg by mouth 2 (two) times daily.   losartan 100 MG tablet Commonly known as:  COZAAR Take 100 mg by mouth daily.   metoprolol succinate 50 MG 24 hr tablet Commonly known as:  TOPROL-XL Take 1 tablet (50 mg total) by mouth daily. Take with or immediately following a meal.   multivitamin with minerals Tabs tablet Take 1 tablet by mouth daily.   simvastatin 40 MG tablet Commonly known as:  ZOCOR Take 40 mg by mouth daily.   timolol 0.5 % ophthalmic solution Commonly known as:  TIMOPTIC Place 1 drop into both eyes 2 (two) times daily.       Disposition and follow-up:   JeremiahJeremiah [name] [facility] Good [facility].  At the hospital follow up visit please address:  1.  R heel  infection - Evaluated by vascular surgery to have occluded SFAs w/ non healing right heel ulcer - Vascular surgery performed AKA during admission for infected ulcer - Ensure he has proper follow up with vascular surgery - Ensure his left foot does not have any ulcers as left SFA also has occlusion  2. Right Middle Lobe Nodule - Right pleural effusion on chest X-ray tapped by thoracentesis - Showed exudate pleural effusion with reactive mesothelial cells and lymphocytes on cytology - New right middle lobe nodule found on follow up CT - Please ensure he has follow up with pulm outpatient for evaluation of pulmonary nodule  3. Iron deficiency anemia - Stable anemia w/ hgb at 7-8 - Received 1 unit post op - Started on ferrous sulfate 325mg  TID - Ensure his hemoglobin is improving and adjust iron supplementation dosage as needed  4. Hypokalemia - Patient was hypokalemic to 2.7 on the day of discharge. Etiology unknown. - He was repleted with oral and IV potassium. K at discharge was 3.6. - Ensure his potassium continues to be stable.  2.  Labs / imaging needed at time of follow-up: CBC, BMP  3.  Pending labs/ test needing follow-up: None Follow-up Appointments:  Contact information for follow-up providers    Maeola Harmanain, Brandon Christopher, MD In 4 weeks.   Specialties:  Vascular Surgery, Cardiology Why:  Office will  call you to arrange your appt (sent) Contact information: [address] [place] [address] [phone]         Pulmonary Care Follow up.   Specialty:  Pulmonology Why:  Please call to make an appointment ASAP to evaluate the cause of fluid build up in your lungs Contact information: [address] [place] [phone]           Contact information for after-discharge care    Destination    [facility] .   Service:  Skilled Nursing Contact information: [address] [place] [phone]                  Hospital Course by problem list:  R heel infection Presented w/ right leg pain around chronic non-healing R heel ulcer and altered mental status.Found to have critical ischemia of lower extremities w/ bilateral occluded SFAs prior to admission w/ plan for bypass grafting. Found to have leukocytosis and febrile on admission. Started on cefepime, vanc and flagyl. Blood culture grew pan-sensitive proteus. Abx regimen narrowed to ampicillin. Vascular surgery performed R AKA. Ampicillin continued to cover 8 days of abx treatment. White count trended down to normal range. Discharged to inpatient rehab with recommendation to follow up with vascular surgery in 4 weeks  Right Middle Lobe Nodule Presented w/ right pleural effusion found on chest x-ray. Noted to be chronic on previous X-rays. Thoracentesis performed showing exudative pleural effusion (pleural LDH /serum LDH > 0.6). Cytology revealed reactive mesothelial cells and lymphocytes. Follow up CT revealed new right middle lobe nodule with high risk characteristics and bilateral calcified pleural plaques concerning for asbestos exposure. Cardiothoracic recommended outpatient follow up with pulm. Patient provided with [facility] pulmonology contact information and advised to schedule an appointment ASAP.  Iron deficiency Anemia Presented w/ hemoglobin of 10. Drop down to 7.4 after fluid administration and stabilized around 7.1-7.3. Iron panel revealed low iron, low TIBC, low saturation ratio. Ferritin elevated in setting of acute infection. 1 unit of pRBC given post AKA. Post-transfusion trended down back to 7.4-7.8 with presumed oozing from surgical site. Vascular surgery ensured good temponade at stump. Started on ferrous sulfate 325mg  TID.   Discharge Vitals:   BP (!) 181/62 Comment: will give PRN hydralazine  Pulse 61   Temp 98.7 F (37.1 C) (Oral)   Resp 18   Ht 5\' 9"  (1.753 m)   Wt 73.4 kg    SpO2 98%   BMI 23.90 kg/m   Pertinent Labs, Studies, and Procedures:  CBC Latest Ref Rng & Units 11/13/2018 11/12/2018 11/11/2018  WBC 4.0 - 10.5 K/uL 13.6(H) 14.0(H) 13.9(H)  Hemoglobin 13.0 - 17.0 g/dL 7.2(L) 7.4(L) 7.8(L)  Hematocrit 39.0 - 52.0 % 22.8(L) 22.5(L) 23.4(L)  Platelets 150 - 400 K/uL 381 395 393   BMP Latest Ref Rng & Units 11/15/2018 11/15/2018 11/11/2018  Glucose 70 - 99 mg/dL - 956(L150(H) 91  BUN 8 - 23 mg/dL - <8(V<5(L) 15  Creatinine 0.61 - 1.24 mg/dL - 5.640.86 3.321.05  BUN/Creat Ratio 10 - 24 - - -  Sodium 135 - 145 mmol/L - 136 137  Potassium 3.5 - 5.1 mmol/L 3.6 2.7(LL) 3.8  Chloride 98 - 111 mmol/L - 102 104  CO2 22 - 32 mmol/L - 26 22  Calcium 8.9 - 10.3 mg/dL - 8.1(L) 7.7(L)   Iron/TIBC/Ferritin/ %Sat    Component Value Date/Time   IRON 12 (L) 11/06/2018 0603   TIBC 161 (  L) 11/06/2018 0603   FERRITIN 637 (H) 11/06/2018 0603   IRONPCTSAT 7 (L) 11/06/2018 0603   CT Chest WO CONTRAST IMPRESSION: 1. Small right and tiny left pleural effusions. 2. Collapse/consolidation posterior right lower lobe. Retraction of bronchovascular anatomy into this area of consolidation raises the question of rounded atelectasis although the process is new since 07/07/2018. 3. 1.1 x 1.8 cm peripheral right middle lobe nodule becomes incorporated into areas of apparent atelectasis. This finding is also new since 07/07/2018. Potentially related to rounded atelectasis, but neoplasm can not be excluded. Close follow-up recommended. 4.  Aortic Atherosclerois (ICD10-170.0) 5. 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  Discharge Instructions: Discharge Instructions    Diet - low sodium heart healthy   Complete by:  As directed    Discharge instructions   Complete by:  As directed    It was a pleasure taking care of you, Jeremiah [name]!  1. You were treated for a foot infection and had an amputation to control the infection. You are now safe for  discharge to CIR where you will continue to regain your strength. You will also follow-up with Dr. [name] of vascular surgery.  2. We made a few changes to your medications while you were here - Start taking amlodipine 10mg  daily for high blood pressure. - Start taking an iron supplement to increase your blood counts.  3. You will need to follow-up with pulmonology to better evaluate the cause of fluid collection around your lungs. I have provided the number for [facility]. Please make an appointment to see them ASAP.  Feel free to call our clinic at [phone] if you have any questions.   Thanks, Dr. Avie Arenas   Increase activity slowly   Complete by:  As directed       Signed: Dorrell, Cathleen Corti, MD 11/15/2018, 4:26 PM   Pager: [phone]

## 2018-11-13 NOTE — Progress Notes (Signed)
   Subjective:   [name] was seen sitting up in his bed about to eat breakfast this morning. He mentioned that he still had some tenderness to palpation at his right aka site. He is a bit slowed in his responses and demeanor this morning.   Objective:  Vital signs in last 24 hours: Vitals:   11/12/18 2239 11/12/18 2243 11/13/18 0437 11/13/18 0657  BP: (!) 150/59 (!) 133/55 (!) 191/81 (!) 196/80  Pulse: 66 70 79   Resp:   18   Temp:   98.9 F (37.2 C)   TempSrc:   Oral   SpO2:   96%   Weight:   74.6 kg   Height:       Physical Exam  Constitutional: He is oriented to person, place, and time. He appears well-developed and well-nourished. No distress.  HENT:  Head: Normocephalic and atraumatic.  Eyes: Conjunctivae are normal.  Cardiovascular: Normal rate, regular rhythm and normal heart sounds.  Respiratory: Effort normal and breath sounds normal. No respiratory distress. He has no wheezes.  GI: Soft. Bowel sounds are normal. He exhibits no distension. There is no abdominal tenderness.  Musculoskeletal:     Comments: Right aka  Neurological: He is alert and oriented to person, place, and time.  Skin: He is not diaphoretic. No erythema.  Psychiatric: He has a normal mood and affect. His behavior is normal. Judgment and thought content normal.    Assessment/Plan: [name] is a 72 year old male with diastolic heart failure, PVD, hypertension, recurrent CAD difficile, diabetes mellitus type 2 who presented with right foot pain.  He had AKA done 12/17.   Sepsis 2/2 right foot infection Source control with aka on 12/17. Wbc=13 and afebrile.  Patient has not needed more than 1 dose of the PRN pain medications over the past 2 days.  -Oxycodone every 4 hours as needed -Discontinued morphine 2 mg every 2 hours as needed -Continue PT and OT  Right pleural effusion Exudative effusion. CT chest 11/05/18 with new right middle lobe nodule. CVTS recommended f/u with pulm outpatient.    Iron Deficiency Anemia Patient's hemoglobin 7.2. Likely post op blood loss.  -continue ferrous sulfate 325mg  tid  HTN Patient's blood pressure ranged 130-190s/50-80.  Given 1 dose IV labetalol 5 mg at night.  -Continue metoprolol 50 mg daily -Continue losartan 100 mg daily   Dispo: Anticipated discharge in approximately 0-1 day(s). MEDICALLY STABLE AWAITING AUTHORIZATION FOR TRANSFER TO CIR  Lorenso Courierhundi, Dossie Swor, MD 11/13/2018, 8:52 AM Pager: [phone]

## 2018-11-13 NOTE — Progress Notes (Signed)
Page back to MD to discuss findings in IPR note, regarding insurance denial and family appealing. Insurance/appeal changes arent typically resolved on weekends.

## 2018-11-13 NOTE — Progress Notes (Signed)
  Date: 11/13/2018  Patient name: Jeremiah [name]  Medical record number: [id number]  Date of birth: September 14, 1946   This patient's plan of care was discussed with the house staff. Please see Dr. Cannon Kettlehundi's note for complete details. I concur with her findings.   Inez CatalinaMullen, Anupama Piehl B, MD 11/13/2018, 9:51 PM

## 2018-11-14 LAB — CULTURE, BLOOD (ROUTINE X 2)
Culture: NO GROWTH
Culture: NO GROWTH
Special Requests: ADEQUATE
Special Requests: ADEQUATE

## 2018-11-14 LAB — GLUCOSE, CAPILLARY
Glucose-Capillary: 172 mg/dL — ABNORMAL HIGH (ref 70–99)
Glucose-Capillary: 125 mg/dL — ABNORMAL HIGH (ref 70–99)
Glucose-Capillary: 172 mg/dL — ABNORMAL HIGH (ref 70–99)
Glucose-Capillary: 162 mg/dL — ABNORMAL HIGH (ref 70–99)
Glucose-Capillary: 127 mg/dL — ABNORMAL HIGH (ref 70–99)

## 2018-11-14 MED ORDER — AMLODIPINE BESYLATE 2.5 MG PO TABS
5.0000 mg | ORAL_TABLET | Freq: Every day | ORAL | Status: DC
  Administered 2018-11-14: 5 mg via ORAL
  Filled 2018-11-14: qty 2

## 2018-11-14 NOTE — Progress Notes (Signed)
  Date: 11/14/2018  Patient name: Curley SpiceWalter L Avitia  Medical record number: [id number]  Date of birth: [birth year]   I have seen and evaluated this patient and I have discussed the plan of care with the house staff. Please see Dr. Cannon Kettlehundi's note for complete details. I concur with her findings with the following additions/corrections:   BP is running high.  Would be prudent to add another BP medication to get better control of BP.  He is on losartan and metoprolol.  Can consider adding amlodipine to better help control his pressures, particularly in the setting of known history of PVD and DM2.  Inez CatalinaMullen, Aylana Hirschfeld B, MD 11/14/2018, 1:47 PM

## 2018-11-14 NOTE — Progress Notes (Signed)
   Subjective:   [name] was seen resting in his bed this morning. He states that he continues to remain stable and without worsening dyspnea.   Objective:  Vital signs in last 24 hours: Vitals:   11/14/18 0454 11/14/18 0642 11/14/18 0644 11/14/18 1016  BP: (!) 186/65 (!) 180/68 (!) 157/60 (!) 170/63  Pulse: 64 65 66 69  Resp: 18     Temp: 99.3 F (37.4 C)     TempSrc: Oral     SpO2: 98%     Weight:      Height:       Physical Exam  Constitutional: He appears well-developed and well-nourished. No distress.  HENT:  Head: Normocephalic and atraumatic.  Eyes: Conjunctivae are normal.  Cardiovascular: Normal rate, regular rhythm and normal heart sounds.  Respiratory: Effort normal and breath sounds normal. No respiratory distress. He has no wheezes.  GI: Soft. Bowel sounds are normal. He exhibits no distension. There is no abdominal tenderness.  Musculoskeletal:        General: No edema.     Comments: Right aka site without tenderness to palpation  Neurological: He is alert.  Skin: He is not diaphoretic. No erythema.  Psychiatric: He has a normal mood and affect. His behavior is normal. Judgment and thought content normal.   Assessment/Plan:  [name] is a 72 year old male with diastolic heart failure, PVD, hypertension, recurrent CAD difficile, diabetes mellitus type 2 who presented with right foot pain.  He had AKA done 12/17.   Sepsis 2/2 right foot infection Source control with aka on 12/17. Will need physical therapy post aka. Was denied placement in CIR, working on snf placement.  -Oxycodone every 4 hours as needed -Continue PT and OT  Right pleural effusion Exudative effusion. CT chest 11/05/18 with new right middle lobe nodule. Will be followed up regarding this outpatient.  HTN Patient's blood pressure ranged 150-170/60-70s.    -Continue metoprolol 50 mg daily -Continue losartan 100 mg daily  Dispo: Anticipated discharge in approximately 1-2 day(s).    Jeremiah [name], Jeremiah Oberman, MD 11/14/2018, 12:19 PM Pager: [phone]

## 2018-11-14 NOTE — Progress Notes (Signed)
CSW spoke with patients spouse, Phillys, regarding disposition plans- CSW informed spouse of CIR denial and if family was still interested in pursuing SNF placement. Spouse stated [facility] were not interested [facility] due to it being too far away. CSW provided spouse with additional bed offers and is awaiting call back to start insurance authorization.   CSW will continue to follow.   Stacy GardnerErin Taylour Lietzke, LCSW Clinical Social Worker  [facility]  [phone]

## 2018-11-14 NOTE — Progress Notes (Signed)
Patient resting comfortably during shift report. Denies complaints.  

## 2018-11-15 ENCOUNTER — Other Ambulatory Visit: Payer: Self-pay

## 2018-11-15 ENCOUNTER — Inpatient Hospital Stay (HOSPITAL_COMMUNITY)
Admission: RE | Admit: 2018-11-15 | Discharge: 2018-12-07 | DRG: 560 | Disposition: A | Discharge status: Home Health | Payer: Medicare Other | Source: Intra-hospital | Attending: Physical Medicine & Rehabilitation | Admitting: Physical Medicine & Rehabilitation

## 2018-11-15 ENCOUNTER — Ambulatory Visit: Payer: Medicare Other | Admitting: Internal Medicine

## 2018-11-15 ENCOUNTER — Encounter (HOSPITAL_COMMUNITY): Payer: Self-pay

## 2018-11-15 DIAGNOSIS — E46 Unspecified protein-calorie malnutrition: Secondary | ICD-10-CM | POA: Diagnosis not present

## 2018-11-15 DIAGNOSIS — I5032 Chronic diastolic (congestive) heart failure: Secondary | ICD-10-CM | POA: Diagnosis present

## 2018-11-15 DIAGNOSIS — Z961 Presence of intraocular lens: Secondary | ICD-10-CM | POA: Diagnosis present

## 2018-11-15 DIAGNOSIS — E785 Hyperlipidemia, unspecified: Secondary | ICD-10-CM

## 2018-11-15 DIAGNOSIS — R0989 Other specified symptoms and signs involving the circulatory and respiratory systems: Secondary | ICD-10-CM

## 2018-11-15 DIAGNOSIS — Z89611 Acquired absence of right leg above knee: Secondary | ICD-10-CM | POA: Diagnosis not present

## 2018-11-15 DIAGNOSIS — E1142 Type 2 diabetes mellitus with diabetic polyneuropathy: Secondary | ICD-10-CM | POA: Diagnosis present

## 2018-11-15 DIAGNOSIS — E875 Hyperkalemia: Secondary | ICD-10-CM | POA: Diagnosis not present

## 2018-11-15 DIAGNOSIS — R197 Diarrhea, unspecified: Secondary | ICD-10-CM

## 2018-11-15 DIAGNOSIS — Z9841 Cataract extraction status, right eye: Secondary | ICD-10-CM | POA: Diagnosis not present

## 2018-11-15 DIAGNOSIS — Z87891 Personal history of nicotine dependence: Secondary | ICD-10-CM

## 2018-11-15 DIAGNOSIS — E8809 Other disorders of plasma-protein metabolism, not elsewhere classified: Secondary | ICD-10-CM

## 2018-11-15 DIAGNOSIS — G8918 Other acute postprocedural pain: Secondary | ICD-10-CM

## 2018-11-15 DIAGNOSIS — Z79899 Other long term (current) drug therapy: Secondary | ICD-10-CM

## 2018-11-15 DIAGNOSIS — N179 Acute kidney failure, unspecified: Secondary | ICD-10-CM | POA: Diagnosis not present

## 2018-11-15 DIAGNOSIS — E11649 Type 2 diabetes mellitus with hypoglycemia without coma: Secondary | ICD-10-CM | POA: Diagnosis not present

## 2018-11-15 DIAGNOSIS — I110 Hypertensive heart disease with heart failure: Secondary | ICD-10-CM | POA: Diagnosis present

## 2018-11-15 DIAGNOSIS — A0472 Enterocolitis due to Clostridium difficile, not specified as recurrent: Secondary | ICD-10-CM | POA: Diagnosis not present

## 2018-11-15 DIAGNOSIS — A0471 Enterocolitis due to Clostridium difficile, recurrent: Secondary | ICD-10-CM | POA: Diagnosis not present

## 2018-11-15 DIAGNOSIS — Z9049 Acquired absence of other specified parts of digestive tract: Secondary | ICD-10-CM

## 2018-11-15 DIAGNOSIS — Z833 Family history of diabetes mellitus: Secondary | ICD-10-CM | POA: Diagnosis not present

## 2018-11-15 DIAGNOSIS — D62 Acute posthemorrhagic anemia: Secondary | ICD-10-CM | POA: Diagnosis present

## 2018-11-15 DIAGNOSIS — R7309 Other abnormal glucose: Secondary | ICD-10-CM | POA: Diagnosis not present

## 2018-11-15 DIAGNOSIS — Z8249 Family history of ischemic heart disease and other diseases of the circulatory system: Secondary | ICD-10-CM

## 2018-11-15 DIAGNOSIS — M109 Gout, unspecified: Secondary | ICD-10-CM | POA: Diagnosis present

## 2018-11-15 DIAGNOSIS — E871 Hypo-osmolality and hyponatremia: Secondary | ICD-10-CM

## 2018-11-15 DIAGNOSIS — E1151 Type 2 diabetes mellitus with diabetic peripheral angiopathy without gangrene: Secondary | ICD-10-CM | POA: Diagnosis present

## 2018-11-15 DIAGNOSIS — I10 Essential (primary) hypertension: Secondary | ICD-10-CM

## 2018-11-15 DIAGNOSIS — Z9842 Cataract extraction status, left eye: Secondary | ICD-10-CM | POA: Diagnosis not present

## 2018-11-15 DIAGNOSIS — Z7982 Long term (current) use of aspirin: Secondary | ICD-10-CM

## 2018-11-15 DIAGNOSIS — E876 Hypokalemia: Secondary | ICD-10-CM | POA: Diagnosis present

## 2018-11-15 DIAGNOSIS — S78111A Complete traumatic amputation at level between right hip and knee, initial encounter: Secondary | ICD-10-CM | POA: Diagnosis not present

## 2018-11-15 DIAGNOSIS — E162 Hypoglycemia, unspecified: Secondary | ICD-10-CM

## 2018-11-15 DIAGNOSIS — G4730 Sleep apnea, unspecified: Secondary | ICD-10-CM | POA: Diagnosis present

## 2018-11-15 DIAGNOSIS — Z4781 Encounter for orthopedic aftercare following surgical amputation: Principal | ICD-10-CM

## 2018-11-15 DIAGNOSIS — D72829 Elevated white blood cell count, unspecified: Secondary | ICD-10-CM | POA: Diagnosis not present

## 2018-11-15 LAB — CBC
HCT: 24.9 % — ABNORMAL LOW (ref 39.0–52.0)
Hemoglobin: 8.2 g/dL — ABNORMAL LOW (ref 13.0–17.0)
MCH: 27 pg (ref 26.0–34.0)
MCHC: 32.9 g/dL (ref 30.0–36.0)
MCV: 81.9 fL (ref 80.0–100.0)
Platelets: 419 10*3/uL — ABNORMAL HIGH (ref 150–400)
RBC: 3.04 MIL/uL — ABNORMAL LOW (ref 4.22–5.81)
RDW: 14.4 % (ref 11.5–15.5)
WBC: 15.9 10*3/uL — ABNORMAL HIGH (ref 4.0–10.5)
nRBC: 0 % (ref 0.0–0.2)

## 2018-11-15 LAB — BASIC METABOLIC PANEL
Anion gap: 8 (ref 5–15)
BUN: <5 mg/dL — ABNORMAL LOW (ref 8–23)
CO2: 26 mmol/L (ref 22–32)
Calcium: 8.1 mg/dL — ABNORMAL LOW (ref 8.9–10.3)
Chloride: 102 mmol/L (ref 98–111)
Creatinine, Ser: 0.86 mg/dL (ref 0.61–1.24)
GFR calc Af Amer: >60 mL/min (ref >60)
GFR calc non Af Amer: >60 mL/min (ref >60)
Glucose, Bld: 150 mg/dL — ABNORMAL HIGH (ref 70–99)
Potassium: 2.7 mmol/L — CL (ref 3.5–5.1)
Sodium: 136 mmol/L (ref 135–145)

## 2018-11-15 LAB — GLUCOSE, CAPILLARY
Glucose-Capillary: 129 mg/dL — ABNORMAL HIGH (ref 70–99)
Glucose-Capillary: 166 mg/dL — ABNORMAL HIGH (ref 70–99)
Glucose-Capillary: 115 mg/dL — ABNORMAL HIGH (ref 70–99)
Glucose-Capillary: 103 mg/dL — ABNORMAL HIGH (ref 70–99)

## 2018-11-15 LAB — CREATININE, SERUM
Creatinine, Ser: 0.86 mg/dL (ref 0.61–1.24)
GFR calc Af Amer: >60 mL/min (ref >60)
GFR calc non Af Amer: >60 mL/min (ref >60)

## 2018-11-15 LAB — POTASSIUM: Potassium: 3.6 mmol/L (ref 3.5–5.1)

## 2018-11-15 MED ORDER — METOPROLOL SUCCINATE ER 50 MG PO TB24
50.0000 mg | ORAL_TABLET | Freq: Every day | ORAL | Status: DC
  Administered 2018-11-16 – 2018-11-22 (×7): 50 mg via ORAL
  Filled 2018-11-15 (×7): qty 1

## 2018-11-15 MED ORDER — HYDRALAZINE HCL 20 MG/ML IJ SOLN
5.0000 mg | Freq: Four times a day (QID) | INTRAMUSCULAR | Status: DC | PRN
  Administered 2018-11-15: 5 mg via INTRAVENOUS
  Filled 2018-11-15: qty 1

## 2018-11-15 MED ORDER — LOSARTAN POTASSIUM 50 MG PO TABS
100.0000 mg | ORAL_TABLET | Freq: Every day | ORAL | Status: DC
  Administered 2018-11-15 – 2018-12-06 (×22): 100 mg via ORAL
  Filled 2018-11-15 (×23): qty 2

## 2018-11-15 MED ORDER — AMLODIPINE BESYLATE 10 MG PO TABS
10.0000 mg | ORAL_TABLET | Freq: Every day | ORAL | Status: DC
  Administered 2018-11-15: 10 mg via ORAL
  Filled 2018-11-15: qty 1

## 2018-11-15 MED ORDER — POTASSIUM CHLORIDE CRYS ER 20 MEQ PO TBCR
40.0000 meq | EXTENDED_RELEASE_TABLET | Freq: Two times a day (BID) | ORAL | Status: DC
  Administered 2018-11-15: 40 meq via ORAL
  Filled 2018-11-15: qty 2

## 2018-11-15 MED ORDER — ASPIRIN 81 MG PO CHEW
81.0000 mg | CHEWABLE_TABLET | Freq: Every day | ORAL | Status: DC
  Administered 2018-11-16 – 2018-12-07 (×22): 81 mg via ORAL
  Filled 2018-11-15 (×22): qty 1

## 2018-11-15 MED ORDER — ACETAMINOPHEN 650 MG RE SUPP: 650.0000 mg | Freq: Four times a day (QID) | RECTAL | Status: DC | PRN

## 2018-11-15 MED ORDER — FERROUS SULFATE 325 (65 FE) MG PO TABS: 325.0000 mg | ORAL_TABLET | Freq: Three times a day (TID) | ORAL | 0 refills | Status: DC

## 2018-11-15 MED ORDER — ACETAMINOPHEN 325 MG PO TABS
650.0000 mg | ORAL_TABLET | Freq: Four times a day (QID) | ORAL | Status: DC | PRN
  Administered 2018-11-15 – 2018-12-04 (×14): 650 mg via ORAL
  Filled 2018-11-15 (×15): qty 2

## 2018-11-15 MED ORDER — FERROUS SULFATE 325 (65 FE) MG PO TABS
325.0000 mg | ORAL_TABLET | Freq: Three times a day (TID) | ORAL | Status: DC
  Administered 2018-11-16 – 2018-11-20 (×13): 325 mg via ORAL
  Filled 2018-11-15 (×12): qty 1

## 2018-11-15 MED ORDER — ATORVASTATIN CALCIUM 10 MG PO TABS: 20.0000 mg | ORAL_TABLET | Freq: Every day | ORAL | Status: DC

## 2018-11-15 MED ORDER — POTASSIUM CHLORIDE CRYS ER 20 MEQ PO TBCR
40.0000 meq | EXTENDED_RELEASE_TABLET | Freq: Two times a day (BID) | ORAL | Status: DC
  Administered 2018-11-15 – 2018-12-01 (×32): 40 meq via ORAL
  Filled 2018-11-15 (×32): qty 2

## 2018-11-15 MED ORDER — TIMOLOL MALEATE 0.5 % OP SOLN
1.0000 [drp] | Freq: Two times a day (BID) | OPHTHALMIC | Status: DC
  Administered 2018-11-15 – 2018-12-07 (×44): 1 [drp] via OPHTHALMIC
  Filled 2018-11-15 (×2): qty 5

## 2018-11-15 MED ORDER — INSULIN ASPART 100 UNIT/ML ~~LOC~~ SOLN
0.0000 [IU] | Freq: Three times a day (TID) | SUBCUTANEOUS | Status: DC
  Administered 2018-11-16 (×2): 2 [IU] via SUBCUTANEOUS
  Administered 2018-11-17: 3 [IU] via SUBCUTANEOUS
  Administered 2018-11-17 – 2018-11-18 (×3): 2 [IU] via SUBCUTANEOUS
  Administered 2018-11-19 – 2018-11-20 (×2): 3 [IU] via SUBCUTANEOUS
  Administered 2018-11-20 – 2018-11-22 (×6): 2 [IU] via SUBCUTANEOUS
  Administered 2018-11-23: 3 [IU] via SUBCUTANEOUS
  Administered 2018-11-23: 2 [IU] via SUBCUTANEOUS
  Administered 2018-11-24 (×2): 3 [IU] via SUBCUTANEOUS
  Administered 2018-11-25 (×2): 2 [IU] via SUBCUTANEOUS
  Administered 2018-11-26: 3 [IU] via SUBCUTANEOUS
  Administered 2018-11-27 (×2): 2 [IU] via SUBCUTANEOUS
  Administered 2018-11-27: 3 [IU] via SUBCUTANEOUS
  Administered 2018-11-28 – 2018-12-02 (×8): 2 [IU] via SUBCUTANEOUS
  Administered 2018-12-03: 3 [IU] via SUBCUTANEOUS
  Administered 2018-12-03: 2 [IU] via SUBCUTANEOUS
  Administered 2018-12-04: 3 [IU] via SUBCUTANEOUS
  Administered 2018-12-04: 2 [IU] via SUBCUTANEOUS
  Administered 2018-12-05: 3 [IU] via SUBCUTANEOUS
  Administered 2018-12-05 – 2018-12-06 (×2): 2 [IU] via SUBCUTANEOUS

## 2018-11-15 MED ORDER — POTASSIUM CHLORIDE 10 MEQ/100ML IV SOLN
10.0000 meq | INTRAVENOUS | Status: AC
  Administered 2018-11-15 (×4): 10 meq via INTRAVENOUS
  Filled 2018-11-15 (×4): qty 100

## 2018-11-15 MED ORDER — HEPARIN SODIUM (PORCINE) 5000 UNIT/ML IJ SOLN
5000.0000 [IU] | Freq: Three times a day (TID) | INTRAMUSCULAR | Status: DC
  Administered 2018-11-15 – 2018-11-28 (×38): 5000 [IU] via SUBCUTANEOUS
  Filled 2018-11-15 (×38): qty 1

## 2018-11-15 MED ORDER — AMLODIPINE BESYLATE 10 MG PO TABS
10.0000 mg | ORAL_TABLET | Freq: Every day | ORAL | Status: DC
  Administered 2018-11-16 – 2018-11-25 (×10): 10 mg via ORAL
  Filled 2018-11-15 (×10): qty 1

## 2018-11-15 MED ORDER — BRIMONIDINE TARTRATE 0.2 % OP SOLN
1.0000 [drp] | Freq: Two times a day (BID) | OPHTHALMIC | Status: DC
  Administered 2018-11-15 – 2018-12-07 (×44): 1 [drp] via OPHTHALMIC
  Filled 2018-11-15 (×2): qty 5

## 2018-11-15 MED ORDER — AMLODIPINE BESYLATE 10 MG PO TABS: 10.0000 mg | ORAL_TABLET | Freq: Every day | ORAL | 0 refills | Status: DC

## 2018-11-15 MED ORDER — ATORVASTATIN CALCIUM 10 MG PO TABS
20.0000 mg | ORAL_TABLET | Freq: Every day | ORAL | Status: DC
  Administered 2018-11-16 – 2018-12-07 (×22): 20 mg via ORAL
  Filled 2018-11-15 (×22): qty 2

## 2018-11-15 MED ORDER — HEPARIN SODIUM (PORCINE) 5000 UNIT/ML IJ SOLN: 5000.0000 [IU] | Freq: Three times a day (TID) | INTRAMUSCULAR | Status: DC

## 2018-11-15 MED ORDER — SENNOSIDES-DOCUSATE SODIUM 8.6-50 MG PO TABS: 1.0000 | ORAL_TABLET | Freq: Every evening | ORAL | Status: DC | PRN

## 2018-11-15 MED ORDER — SORBITOL 70 % SOLN
30.0000 mL | Freq: Every day | Status: DC | PRN
  Administered 2018-12-05 – 2018-12-06 (×2): 30 mL via ORAL
  Filled 2018-11-15 (×2): qty 30

## 2018-11-15 NOTE — Progress Notes (Signed)
Physical Therapy Treatment Patient Details Name: Jeremiah [name] MRN: [id number] DOB: 1946/06/02 Today's Date: 11/15/2018    History of Present Illness Pt is a 72 y.o. male admitted 11/03/18 with RLE pain and AMS; worked up for sepsis. Xray without osteomyelitis. Head CT without acute abnormality. 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 PMH includes R foot ulcer, DM2, PVD, HTN, CHF, arthritis.    PT Comments    Patient progressing slowly towards PT goals. Continues to require Max A for bed mobility with cues and increased time. Tolerated standing bouts using stedy with pt pulling up on handles. Barely able to clear bottom to stand in stedy. Demonstrates marked weakness in BUEs/LEs. Tolerated exercises on right residual limb. Rewrapped right residual limb post session. Will follow.   Follow Up Recommendations  CIR;Supervision for mobility/OOB     Equipment Recommendations  None recommended by PT    Recommendations for Other Services       Precautions / Restrictions Precautions Precautions: Fall Restrictions Weight Bearing Restrictions: Yes RLE Weight Bearing: Non weight bearing    Mobility  Bed Mobility Overal bed mobility: Needs Assistance Bed Mobility: Supine to Sit     Supine to sit: Max assist;HOB elevated     General bed mobility comments: cues for sequencing and hand placement; use of chuck pad  Transfers Overall transfer level: Needs assistance   Transfers: Sit to/from Stand Sit to Stand: Max assist;+2 physical assistance;From elevated surface         General transfer comment: Max A +2 sit<>stand from seat of Corene CorneaSara Stedy with barely clearing buttocks to get seat around under him and out from under him. Unable to maintain standing once seat removed  Ambulation/Gait                 Stairs             Wheelchair Mobility    Modified Rankin (Stroke Patients Only)       Balance Overall balance assessment: Needs assistance Sitting-balance support:  Bilateral upper extremity supported(LLE supported) Sitting balance-Leahy Scale: Poor Sitting balance - Comments: Pt with tendency to lean left but could self correct with cues and increased time, no LOB just leaning to left   Standing balance support: During functional activity;Bilateral upper extremity supported Standing balance-Leahy Scale: Poor Standing balance comment: Max A +2 to come partially up with use of sara stedy, gait belt and chuck pad under him                            Cognition Arousal/Alertness: Awake/alert Behavior During Therapy: WFL for tasks assessed/performed Overall Cognitive Status: Impaired/Different from baseline Area of Impairment: Following commands;Safety/judgement;Problem solving;Attention;Memory                   Current Attention Level: Selective Memory: Decreased short-term memory Following Commands: Follows one step commands with increased time Safety/Judgement: Decreased awareness of safety;Decreased awareness of deficits   Problem Solving: Decreased initiation;Requires verbal cues;Requires tactile cues;Difficulty sequencing General Comments: Unable to recall exercises from prior session.       Exercises Amputee Exercises Hip Extension: Right;AROM;10 reps;Seated Hip ABduction/ADduction: AROM;5 reps;Right;Seated    General Comments        Pertinent Vitals/Pain Pain Assessment: Faces Faces Pain Scale: Hurts little more Pain Location: R residual limb once moving about Pain Descriptors / Indicators: Discomfort;Guarding;Grimacing Pain Intervention(s): Monitored during session;Repositioned;Other (comment)(re wrapped with ace bandage)    Home Living  Prior Function            PT Goals (current goals can now be found in the care plan section) Progress towards PT goals: Progressing toward goals    Frequency    Min 3X/week      PT Plan Current plan remains appropriate     Co-evaluation PT/OT/SLP Co-Evaluation/Treatment: Yes Reason for Co-Treatment: To address functional/ADL transfers;For patient/therapist safety PT goals addressed during session: Mobility/safety with mobility;Balance OT goals addressed during session: ADL's and self-care      AM-PAC PT "6 Clicks" Mobility   Outcome Measure  Help needed turning from your back to your side while in a flat bed without using bedrails?: A Little Help needed moving from lying on your back to sitting on the side of a flat bed without using bedrails?: A Lot Help needed moving to and from a bed to a chair (including a wheelchair)?: Total Help needed standing up from a chair using your arms (e.g., wheelchair or bedside chair)?: Total Help needed to walk in hospital room?: Total Help needed climbing 3-5 steps with a railing? : Total 6 Click Score: 9    End of Session Equipment Utilized During Treatment: Gait belt Activity Tolerance: Patient limited by pain Patient left: with call bell/phone within reach;in chair;with chair alarm set;with family/visitor present Nurse Communication: Mobility status PT Visit Diagnosis: Other abnormalities of gait and mobility (R26.89);Muscle weakness (generalized) (M62.81)     Time: [id number] PT Time Calculation (min) (ACUTE ONLY): 27 min  Charges:  $Therapeutic Activity: 8-22 mins                     Mylo RedShauna Kahlen Boyde, PT, DPT Acute Rehabilitation Services Pager [phone] Office [phone]       Blake DivineShauna A Lanier EnsignHartshorne 11/15/2018, 2:48 PM

## 2018-11-15 NOTE — Progress Notes (Addendum)
   Subjective: No overnight events. Pt was hypertensive to 193/68 this morning, but he reports that he is feeling well with no new symptoms. He continues to have some pain in his right AKA stump, but only when it is touched. He and his family have picked a SNF and he is looking forward to discharge. He has no concerns today.   Objective:  Vital signs in last 24 hours: Vitals:   11/14/18 1632 11/14/18 1922 11/15/18 0413 11/15/18 0413  BP: (!) 162/47 (!) 168/55  (!) 193/68  Pulse:  66  70  Resp:  18  18  Temp:  98.9 F (37.2 C)  98.7 F (37.1 C)  TempSrc:  Oral  Oral  SpO2:  98%  98%  Weight:   73.4 kg   Height:       Physical Exam Constitutional: Laying comfortably in bed. No distress Cardiovascular: Normal rate and regular rhythm. No murmurs, rubs, or gallops. Pulmonary/Chest: Effort normal. Clear to auscultation bilaterally. No wheezes, rales, or rhonchi. Abdominal: Bowel sounds present. Soft, non-distended, non-tender. Ext: Right AKA site with tenderness to palpation. Skin: Warm and dry. No rashes or wounds.  Assessment/Plan:  Active Problems:   Diabetes mellitus without complication (HCC)   Sepsis (HCC)   Fever   Open wound of right foot   Pleural effusion   S/P AKA (above knee amputation) (HCC)   Unilateral AKA, right (HCC)   Hypertensive crisis   Diabetes mellitus type 2 in nonobese [facility](HCC)  Mr. [name] is a 72 year old male with diastolic heart failure, PVD, hypertension, recurrent CAD difficile, diabetes mellitus type 2 who presented with right foot pain. He had AKA done 12/17 for right foot infection. Patient has been accepted to CIR.  Sepsis 2/2 right foot infection - Source control with AKA on 12/17. Will need physical therapy post aka. Was denied placement in CIR. - His pain is overall well-controlled. Tenderness to palpation at AKA site, but patient reports that this is stable and not worsening. He hasnot required any PRN pain medications for days. Plan -  Awaiting SNF placement -Discontinue oxycodone as patient has not been needing it -Continue PT and OT   Right pleural effusion Exudative effusion. CT chest 11/05/18 with new right middle lobe nodule. Will be followed up regarding this outpatient.  HTN - Hypertensive to 193/68 this morning. Asymptomatic.  - Started amlodipine 5mg  yesterday.  Plan -Continue metoprolol 50 mg daily -Continue losartan 100 mg daily - Increase amlodipine from 5 to 10mg .  - Hydralazine 5mg  PRN for SBP > 150  Hypokalemia - Hypokalemic to 2.7 today. Potassium was 3.8 four days ago. He is not on any medications that would cause hypokalemia. He denies diarrhea. He has been eating normally. - EKG shows U waves. Normal QT. Patient is asymptomatic.  Plan - Kdur 40meq BID - IV KCl 10meq x6  Dispo: Anticipated discharge in approximately today or tomorrow pending CIR availability.  Loralie Malta, Cathleen Cortieborah N, MD 11/15/2018, 6:27 AM Pager: [phone]

## 2018-11-15 NOTE — PMR Pre-admission (Signed)
PMR Admission Coordinator Pre-Admission Assessment  Patient: Jeremiah [name] is an 72 y.o., male MRN: [id number] DOB: 04-04-1946 Height: '5\' 9"'$  (175.3 cm) Weight: 73.4 kg              Insurance Information HMO:     PPO: Yes     PCP:       IPA:       80/20:       OTHER:  Group #[id number] PRIMARY: [employer] Medicare      Policy#: [id number]      Subscriber: patient CM Name: Abigail Butts      Phone#: [phone]     Fax#: [phone] Pre-Cert#: [id number] for initial 7 days with update due to Dorthula Nettles at fax [phone]      Employer:  Retired Benefits:  Phone #: [phone]     Name:  Online portal Eff. Date: 12/25/17     Deduct: $0      Out of Pocket Max: $4000 (met $4000)      Life Max: N/A CIR: $160 days 1-10      SNF: $0 days 1-20; $50 days 21-100 Outpatient: medical necessity     Co-Pay: $20/visit Home Health: 100%      Co-Pay: none DME: 70     Co-Pay: 20% Providers: in network  Medicaid Application Date:        Case Manager:   Disability Application Date:        Case Worker:    Emergency Facilities manager Information    Name Relation Home Work Mobile   Henrie,Phyllis Spouse   [phone]   Aaren, Atallah   [phone]   Frandy, Basnett   [phone]     Current Medical History  Patient Admitting Diagnosis:  R AKA  History of Present Illness: A 72 year old right hand male with history of diastolic congestive heart failure, hypertension, recurrent Clostridium difficile infection, type 2 diabetes mellitus. Per chart review patient lives with spouse. One level home 5 steps to entry. He was able to perform household ambulation with a rolling walker. Wife does assist with some basic ADLs. Wife does receive hemodialysis 3 days a week. 2 sons in the area work. Presented 11/04/2018 with ischemic ulceration right heel as well as altered mental status. Leukocytosis 16,500 and low-grade fever. Cranial CT scan negative. X-rays of right foot showed no evidence of osteomyelitis. Blood  cultures Proteus Mirabalis and maintain on anabolic therapy. 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 Servando Snare. Hospital course pain management. Acute blood loss anemia 7.2 with plan for transfusion ,leukocytosis improving 13,600. Hypokalemia 2.7 with supplement added with plan follow-up labs. Follow up potassium 3.6 this afternoon.  Antibiotic therapy completed 11/11/2018. Stump sock placed per vascular surgery/[employer]. Subcutaneous heparin for DVT prophylaxis.Therapy evaluations completed with recommendations of physical medicine rehabilitation consult. Patient to be admitted for a comprehensive inpatient rehabilitation program.    Past Medical History  Past Medical History:  Diagnosis Date  . Arthritis    "joints; shoulders, knees, hands, back" (05/21/2018)  . C. difficile diarrhea 04/2018  . Diastolic CHF ([place])   . High cholesterol   . History of gout   . Hypertension   . Peripheral vascular disease ([place])   . Pneumonia    "couple times" (05/21/2018)  . Sleep apnea    "has mask; won't use" (05/21/2018)  . Type II diabetes mellitus (HCC)     Family History  family history includes Diabetes in his brother, mother,  and sister; Heart attack in his brother and father; Heart disease in his brother and father; Hypertension in his brother, mother, and sister.  Prior Rehab/Hospitalizations: Had HH therapies 3 X a week, no preivous SNF admissions  Has the patient had major surgery during 100 days prior to admission? No  Current Medications   Current Facility-Administered Medications:  .  0.9 %  sodium chloride infusion, , Intravenous, PRN, Rhyne, Samantha J, PA-C, Last Rate: 10 mL/hr at 11/07/18 1606 .  0.9 %  sodium chloride infusion, , Intravenous, Continuous, Rhyne, Samantha J, PA-C, Stopped at 11/13/18 1050 .  acetaminophen (TYLENOL) tablet 650 mg, 650 mg, Oral, Q6H PRN, 650 mg at 11/08/18 1642 **OR** acetaminophen (TYLENOL) suppository  650 mg, 650 mg, Rectal, Q6H PRN, Rhyne, Samantha J, PA-C .  amLODipine (NORVASC) tablet 10 mg, 10 mg, Oral, Daily, Dorrell, Andree Elk, MD, 10 mg at 11/15/18 1014 .  aspirin chewable tablet 81 mg, 81 mg, Oral, Daily, Rhyne, Samantha J, PA-C, 81 mg at 11/15/18 1014 .  [START ON 11/16/2018] atorvastatin (LIPITOR) tablet 20 mg, 20 mg, Oral, Daily, Rebeca Alert, Raynaldo Opitz, MD .  brimonidine (ALPHAGAN) 0.2 % ophthalmic solution 1 drop, 1 drop, Both Eyes, BID, Rhyne, Samantha J, PA-C, 1 drop at 11/15/18 1017 .  docusate sodium (COLACE) capsule 100 mg, 100 mg, Oral, Daily, Rhyne, Samantha J, PA-C, 100 mg at 11/15/18 1014 .  ferrous sulfate tablet 325 mg, 325 mg, Oral, TID WC, Mosetta Anis, MD, 325 mg at 11/15/18 1014 .  heparin injection 5,000 Units, 5,000 Units, Subcutaneous, Q8H, Rhyne, Samantha J, PA-C, 5,000 Units at 11/15/18 2263 .  hydrALAZINE (APRESOLINE) injection 5 mg, 5 mg, Intravenous, Q6H PRN, Rehman, Areeg N, DO, 5 mg at 11/15/18 0844 .  insulin aspart (novoLOG) injection 0-15 Units, 0-15 Units, Subcutaneous, TID WC, Rhyne, Samantha J, PA-C, 2 Units at 11/15/18 1252 .  losartan (COZAAR) tablet 100 mg, 100 mg, Oral, Daily, Rhyne, Samantha J, PA-C, 100 mg at 11/14/18 2335 .  metoprolol succinate (TOPROL-XL) 24 hr tablet 50 mg, 50 mg, Oral, Daily, Rhyne, Samantha J, PA-C, 50 mg at 11/15/18 1014 .  ondansetron (ZOFRAN) injection 4 mg, 4 mg, Intravenous, Q6H PRN, Rhyne, Samantha J, PA-C .  phenol (CHLORASEPTIC) mouth spray 1 spray, 1 spray, Mouth/Throat, PRN, Rhyne, Samantha J, PA-C .  potassium chloride 10 mEq in 100 mL IVPB, 10 mEq, Intravenous, Q1 Hr x 6, Dorrell, Andree Elk, MD, Last Rate: 100 mL/hr at 11/15/18 1438, 10 mEq at 11/15/18 1438 .  potassium chloride SA (K-DUR,KLOR-CON) CR tablet 40 mEq, 40 mEq, Oral, BID, Dorrell, Andree Elk, MD, 40 mEq at 11/15/18 1142 .  promethazine (PHENERGAN) tablet 12.5 mg, 12.5 mg, Oral, Q6H PRN, Rhyne, Samantha J, PA-C .  senna-docusate (Senokot-S) tablet 1  tablet, 1 tablet, Oral, QHS PRN, Rhyne, Samantha J, PA-C .  timolol (TIMOPTIC) 0.5 % ophthalmic solution 1 drop, 1 drop, Both Eyes, BID, Rhyne, Samantha J, PA-C, 1 drop at 11/15/18 1016  Patients Current Diet:  Diet Order            Diet Carb Modified Fluid consistency: Thin; Room service appropriate? Yes  Diet effective now              Precautions / Restrictions Precautions Precautions: Fall Restrictions Weight Bearing Restrictions: Yes RLE Weight Bearing: Non weight bearing   Has the patient had 2 or more falls or a fall with injury in the past year?Yes  Prior Activity Level Limited Community (1-2x/wk): Went out at least  1 X a week to choir Secretary/administrator / [place] Devices/Equipment: Environmental consultant (specify type) Home Equipment: Walker - 2 wheels, Shower seat, Cane - single point, Bedside commode  Prior Device Use: Indicate devices/aids used by the patient prior to current illness, exacerbation or injury? Walker and [employer]  Prior Functional Level Prior Function Level of Independence: Needs assistance Gait / Transfers Assistance Needed: Able to perform household ambulation with RW ADL's / Homemaking Assistance Needed: Wife assists with sponge bathing at sink and other ADLs as needed (wife herself does HD MWF)  Self Care: Did the patient need help bathing, dressing, using the toilet or eating?  Needed some help  Indoor Mobility: Did the patient need assistance with walking from room to room (with or without device)? Independent  Stairs: Did the patient need assistance with internal or external stairs (with or without device)? Independent  Functional Cognition: Did the patient need help planning regular tasks such as shopping or remembering to take medications? Independent  Current Functional Level Cognition  Overall Cognitive Status: Impaired/Different from baseline Current Attention Level: Selective Orientation Level: Oriented X4 Following  Commands: Follows one step commands with increased time Safety/Judgement: Decreased awareness of safety, Decreased awareness of deficits General Comments: Unable to recall exercises from prior session.     Extremity Assessment (includes Sensation/Coordination)  Upper Extremity Assessment: Overall WFL for tasks assessed  Lower Extremity Assessment: Defer to PT evaluation    ADLs  Overall ADL's : Needs assistance/impaired Eating/Feeding: Independent, Sitting Grooming: Wash/dry hands, Wash/dry face, Sitting, Supervision/safety, Set up Grooming Details (indicate cue type and reason): supported sitting in recliner. Poor siting balance unsuported at EOB Upper Body Bathing: Minimal assistance, Sitting Upper Body Bathing Details (indicate cue type and reason): simulated Lower Body Bathing: Maximal assistance, Sitting/lateral leans, Moderate assistance Lower Body Bathing Details (indicate cue type and reason): simulated Upper Body Dressing : Sitting, Min guard Lower Body Dressing: Maximal assistance, Sitting/lateral leans Toilet Transfer: Maximal assistance, +2 for physical assistance Toilet Transfer Details (indicate cue type and reason): use of Denna Haggard from raised surface with cues for whole process Toileting- Clothing Manipulation and Hygiene: Total assistance, Bed level Functional mobility during ADLs: Total assistance, +2 for physical assistance, Cueing for safety    Mobility  Overal bed mobility: Needs Assistance Bed Mobility: Supine to Sit Rolling: Mod assist Supine to sit: Max assist, HOB elevated Sit to supine: Min assist General bed mobility comments: cues for sequencing and hand placement; use of chuck pad    Transfers  Overall transfer level: Needs assistance Equipment used: Rolling walker (2 wheeled) Transfer via Lift Equipment: Stedy Transfers: Sit to/from Stand Sit to Stand: Max assist, +2 physical assistance, From elevated surface Stand pivot transfers: Max assist, +2  physical assistance, From elevated surface Squat pivot transfers: +2 physical assistance, Total assist  Lateral/Scoot Transfers: Mod assist, +2 safety/equipment General transfer comment: Max A +2 sit<>stand from seat of Denna Haggard with barely clearing buttocks to get seat around under him and out from under him. Unable to maintain standing once seat removed    Ambulation / Gait / Stairs / Wheelchair Mobility  Ambulation/Gait Ambulation/Gait assistance: Mod assist Gait Distance (Feet): 1 Feet Assistive device: Rolling walker (2 wheeled) Gait Pattern/deviations: Step-to pattern, Trunk flexed, Leaning posteriorly General Gait Details: Took pivotal steps from bed to recliner requiring modA to prevent posterior LOB; cues for sequencing and to prevent pt sitting prematurely. Pt easily distracted not following commands well Gait velocity: Decreased    Posture /  Balance Dynamic Sitting Balance Sitting balance - Comments: Pt with tendency to lean left but could self correct with cues and increased time, no LOB just leaning to left Balance Overall balance assessment: Needs assistance Sitting-balance support: Bilateral upper extremity supported(LLE supported) Sitting balance-Leahy Scale: Poor Sitting balance - Comments: Pt with tendency to lean left but could self correct with cues and increased time, no LOB just leaning to left Standing balance support: During functional activity, Bilateral upper extremity supported Standing balance-Leahy Scale: Poor Standing balance comment: Max A +2 to come partially up with use of sara stedy, gait belt and chuck pad under him    Special needs/care consideration BiPAP/CPAP No CPM No Continuous Drip IV 0.9% NS 75 mL/hr Dialysis No         Life Vest No Oxygen No Special Bed No Trach Size No Wound Vac (area)    Skin Dressing to R AKA post op site                           Bowel mgmt: Last BM 11/14/18 Bladder mgmt: External urinary catheter,  incontinence Diabetic mgmt Yes, takes oral medication at home    Previous Home Environment Living Arrangements: Spouse/significant other Available Help at Discharge: Family, Available 24 hours/day Type of Home: House Home Layout: One level Home Access: Stairs to enter Entrance Stairs-Rails: Right, Left Entrance Stairs-Number of Steps: 5 Bathroom Shower/Tub: Multimedia programmer: Handicapped height Home Care Services: Yes Type of Home Care Services: [place] (if known): advanced home care  Discharge Living Setting Plans for Discharge Living Setting: House, Lives with (comment)(Lives with wife) Type of Home at Discharge: House Discharge Home Layout: One level Discharge Home Access: Stairs to enter Entrance Stairs-Number of Steps: 4-5 steps but a ramp is to be built Does the patient have any problems obtaining your medications?: No  Social/Family/Support Systems Patient Roles: Spouse, Parent(Has a wife and 2 sons.) Contact Information: Nicholis Stepanek - wife - [phone] Anticipated Caregiver: wife and sons Anticipated Caregiver's Contact Information: Lantz Hermann - son - [phone] Ability/Limitations of Caregiver: Wife goes to HD M-W-F, sons work. Caregiver Availability: Other (Comment)(Wife understands the need for additional assist at DC) Discharge Plan Discussed with Primary Caregiver: Yes Is Caregiver In Agreement with Plan?: Yes Does Caregiver/Family have Issues with Lodging/Transportation while Pt is in Rehab?: No  Goals/Additional Needs Patient/Family Goal for Rehab: PT supervision, OT supervision to min assist goals Expected length of stay: 10-16 days Cultural Considerations: [employer] Dietary Needs: Carb mod, med cal, thin liquids Equipment Needs: TBD Pt/Family Agrees to Admission and willing to participate: Yes(I spoke with patient's wife and son, [name].) Program Orientation Provided & Reviewed with Pt/Caregiver Including Roles   & Responsibilities: Yes  Decrease burden of Care through IP rehab admission: N/A  Possible need for SNF placement upon discharge: Not planned  Patient Condition: This patient's medical and functional status has changed since the consult dated: 11/10/18 in which the Rehabilitation Physician determined and documented that the patient's condition is appropriate for intensive rehabilitative care in an inpatient rehabilitation facility. See "History of Present Illness" (above) for medical update. Functional changes are:  Currently requiring max assist +2 for mobility and ADLs. Patient's medical and functional status update has been discussed with the Rehabilitation physician and patient remains appropriate for inpatient rehabilitation. Will admit to inpatient rehab today.  Preadmission Screen Completed By:  Retta Diones, 11/15/2018 4:14 PM ______________________________________________________________________   Discussed status  with Dr. Posey Pronto on 11/15/18 at 1613 and received telephone approval for admission today.  Admission Coordinator:  Retta Diones, time 1613/Date 12/16/17

## 2018-11-15 NOTE — Care Management Important Message (Signed)
Important Message  Patient Details  Name: Curley SpiceWalter L Zadrozny MRN: [id number] Date of Birth: 1946/05/21   Medicare Important Message Given:  Yes    Dartanyan Deasis P Clarinda Obi 11/15/2018, 3:05 PM

## 2018-11-15 NOTE — Progress Notes (Signed)
CRITICAL VALUE ALERT  Critical Value:  Potassium 2.7  Date & Time Notied: 11/15/17 @ 0940  Provider Notified: Internal Medicine  Orders Received/Actions taken: EKG and replacement potassium ordered

## 2018-11-15 NOTE — IPOC Note (Signed)
Overall Plan of Care [facility](IPOC) Patient Details Name: Jeremiah [name] MRN: [id number] DOB: Jun 12, 1946  Admitting Diagnosis: Right AKA  Hospital Problems: Active Problems:   Right above-knee amputee [facility](HCC)     Functional Problem List: Nursing Bladder, Pain, Safety, Skin Integrity  PT Balance, Endurance, Motor, Safety  OT Balance, Pain, Cognition, Safety, Endurance, Motor, Skin Integrity  SLP    TR         Basic ADL's: OT Bathing, Grooming, Toileting, Dressing     Advanced  ADL's: OT       Transfers: PT Bed Mobility, Bed to Chair, Car, State Street CorporationFurniture, Floor  OT Toilet     Locomotion: PT Ambulation, Psychologist, prison and probation servicesWheelchair Mobility, Stairs     Additional Impairments: OT None  SLP        TR      Anticipated Outcomes Item Anticipated Outcome  Self Feeding Indep  Swallowing      Basic self-care  Min A  Toileting  Min A   Bathroom Transfers Min A  Bowel/Bladder  Patient will be continent of bowel and bladder during admission  Transfers  Min A  Locomotion  Supervision at w/c level  Communication     Cognition     Pain  Patient will be pain free or pain less than 3 during admission  Safety/Judgment  Paitent will follow fall precautions and adhere to safety plan.   Therapy Plan: PT Intensity: Minimum of 1-2 x/day ,45 to 90 minutes PT Frequency: 5 out of 7 days PT Duration Estimated Length of Stay: 21-24 days OT Intensity: Minimum of 1-2 x/day, 45 to 90 minutes OT Frequency: 5 out of 7 days OT Duration/Estimated Length of Stay: 3-3.5 weeks      Team Interventions: Nursing Interventions Patient/Family Education, Pain Management, Skin Care/Wound Management, Psychosocial Support  PT interventions Ambulation/gait training, Warden/rangerBalance/vestibular training, Cognitive remediation/compensation, Community reintegration, Discharge planning, Disease management/prevention, DME/adaptive equipment instruction, Functional mobility training, Pain management, Neuromuscular re-education,  Patient/family education, Psychosocial support, Splinting/orthotics, Therapeutic Activities, Therapeutic Exercise, UE/LE Strength taining/ROM, UE/LE Coordination activities, Wheelchair propulsion/positioning  OT Interventions Warden/rangerBalance/vestibular training, Disease mangement/prevention, Neuromuscular re-education, Patient/family education, Self Care/advanced ADL retraining, Splinting/orthotics, Therapeutic Exercise, UE/LE Coordination activities, Wheelchair propulsion/positioning, Cognitive remediation/compensation, Discharge planning, DME/adaptive equipment instruction, Functional mobility training, Pain management, Psychosocial support, Skin care/wound managment, Therapeutic Activities, UE/LE Strength taining/ROM  SLP Interventions    TR Interventions    SW/CM Interventions Discharge Planning, Psychosocial Support, Patient/Family Education   Barriers to Discharge MD  Medical stability, Wound care and Weight bearing restrictions  Nursing      PT Medical stability, Home environment access/layout    OT Decreased caregiver support    SLP      SW Decreased caregiver support Wife is limited in her care she can provide   Team Discharge Planning: Destination: PT-Home ,OT- Home , SLP-  Projected Follow-up: PT-Home health PT, OT-  Home health OT, 24 hour supervision/assistance, SLP-  Projected Equipment Needs: PT-Sliding board, Wheelchair (measurements), Wheelchair cushion (measurements), To be determined, OT- To be determined, SLP-  Equipment Details: PT-TBD pending progress, OT-  Patient/family involved in discharge planning: PT- Patient,  OT-Patient, SLP-   MD ELOS: 14-18 days. Medical Rehab Prognosis:  Good Assessment: 72 year old right hand male with history of diastolic congestive heart failure, hypertension, recurrent Clostridium difficile infection, type 2 diabetes mellitus. Presented 11/04/2018 with ischemic ulceration right heel as well as altered mental status. Leukocytosis 16,500 and  low-grade fever. Cranial CT scan reviewed, unremarkable for acute intracranial process.  X-rays of right foot showed  no evidence of osteomyelitis. Blood cultures Proteus Mirabalis and maintain on anabolic therapy. 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 Lemar LivingsBrandon [name]. Hospital course pain management. Acute blood loss anemia 7.2 with plan for transfusion, leukocytosis improving 13,600. Hypokalemia 2.7 with supplement added with follow-up labs. Antibiotic therapy completed 11/11/2018. Stump sock placed per vascular surgery/Biotech prosthetics. Patient with resulting functional deficits with mobility, transfers, self-care. Will set goals for Min A with PT/OT.    See Team Conference Notes for weekly updates to the plan of care

## 2018-11-15 NOTE — Progress Notes (Addendum)
IP rehab admissions - We were unable to get approval for CIR through peer to peer review.  The denial for inpatient rehab admission will stand.  Options will be SNF placement at this point.  Call me for questions.  [phone]  Rehab MD, Dr. Allena KatzPatel, has informed me that he was able to overturn the denial with a peer to peer review with medical director this am.  We now have approval for acute inpatient rehab admission.  Call me for questions.  [phone]

## 2018-11-15 NOTE — Progress Notes (Signed)
IP rehab admissions - I spoke with Dr. Allena KatzPatel our rehab MD.  If potassium normalizes after treatment and lab draw later today, could potentially admit to inpatient rehab later today.  Call me for questions.  [phone]

## 2018-11-15 NOTE — H&P (Signed)
Physical Medicine and Rehabilitation Admission H&P    Chief Complaint  Patient presents with  . Code Sepsis  : HPI: Jeremiah SpiceWalter L Melrose do is a 72 year old right hand male with history of diastolic congestive heart failure, hypertension, recurrent Clostridium difficile infection, type 2 diabetes mellitus. History taken from chart review and family. Per chart review patient lives with spouse. One level home 5 steps to entry. He was able to perform household ambulation with a rolling walker. Wife does assist with some basic ADLs. Wife does receive hemodialysis 3 days a week. 2 sons in the area work. Presented 11/04/2018 with ischemic ulceration right heel as well as altered mental status. Leukocytosis 16,500 and low-grade fever. Cranial CT scan reviewed, unremarkable for acute intracranial process.  X-rays of right foot showed no evidence of osteomyelitis. Blood cultures Proteus Mirabalis and maintain on anabolic therapy. 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 Lemar LivingsBrandon Cain. Hospital course pain management. Acute blood loss anemia 7.2 with plan for transfusion, leukocytosis improving 13,600. Hypokalemia 2.7 with supplement added with follow-up labs. Antibiotic therapy completed 11/11/2018. Stump sock placed per vascular surgery/Biotech prosthetics. Subcutaneous heparin for DVT prophylaxis.Therapy evaluations completed with recommendations of physical medicine rehabilitation consult. Patient was admitted for a comp relative rehabilitation program.  Discussed with insurance physician.  Review of Systems  Constitutional: Negative for chills.  HENT: Negative for hearing loss.   Eyes: Negative for blurred vision and double vision.  Respiratory: Negative for cough and shortness of breath.   Cardiovascular: Positive for leg swelling. Negative for chest pain and palpitations.  Gastrointestinal: Positive for constipation. Negative for nausea and vomiting.  Genitourinary:  Positive for urgency.  Musculoskeletal: Positive for joint pain.  Skin: Negative for rash.  Neurological: Positive for weakness.  All other systems reviewed and are negative.  Past Medical History:  Diagnosis Date  . Arthritis    "joints; shoulders, knees, hands, back" (05/21/2018)  . C. difficile diarrhea 04/2018  . Diastolic CHF (HCC)   . High cholesterol   . History of gout   . Hypertension   . Peripheral vascular disease (HCC)   . Pneumonia    "couple times" (05/21/2018)  . Sleep apnea    "has mask; won't use" (05/21/2018)  . Type II diabetes mellitus (HCC)    Past Surgical History:  Procedure Laterality Date  . ABDOMINAL AORTOGRAM W/LOWER EXTREMITY N/A 11/01/2018   Procedure: ABDOMINAL AORTOGRAM W/LOWER EXTREMITY;  Surgeon: Runell GessBerry, Jonathan J, MD;  Location: [facility] INVASIVE CV LAB;  Service: Cardiovascular;  Laterality: N/A;  . AMPUTATION Right 11/09/2018   Procedure: RIGHT - AMPUTATION ABOVE KNEE;  Surgeon: Maeola Harmanain, Brandon Christopher, MD;  [facility];  Service: Vascular;  Laterality: Right;  . CATARACT EXTRACTION W/ INTRAOCULAR LENS  IMPLANT, BILATERAL Bilateral   . CHOLECYSTECTOMY  05/21/2018   ATTEMPTED LAPAROSCOPIC CHOLECYSTECTOMY, OPEN DRAINAGE OF GALLBLADDER WITH BIOPSY  . CHOLECYSTECTOMY N/A 05/21/2018   Procedure: ATTEMPTED LAPAROSCOPIC CHOLECYSTECTOMY, OPEN DRAINAGE OF GALLBLADDER WITH BIOPSY;  Surgeon: Griselda Mineroth, Paul III, MD;  Location: [facility] OR;  Service: General;  Laterality: N/A;  . COLONOSCOPY W/ POLYPECTOMY    . COLONOSCOPY WITH PROPOFOL N/A 09/16/2018   Procedure: COLONOSCOPY WITH PROPOFOL;  Surgeon: Charlott RakesSchooler, Vincent, MD;  Location: [facility] ENDOSCOPY;  Service: Endoscopy;  Laterality: N/A;  . 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 IR CATHETER TUBE CHANGE  04/14/2018  .  IR CHOLANGIOGRAM EXISTING TUBE  03/17/2018  . IR PERC CHOLECYSTOSTOMY  01/31/2018  .  IR RADIOLOGIST EVAL & MGMT  03/02/2018   Family History  Problem Relation Age of Onset  . Diabetes Mother   . Hypertension Mother   . Heart disease Father   . Heart attack Father   . Diabetes Sister   . Hypertension Sister   . Diabetes Brother   . Heart disease Brother   . Hypertension Brother   . Heart attack Brother    Social History:  reports that he quit smoking about 34 years ago. His smoking use included cigarettes. He quit after 24.00 years of use. He has never used smokeless tobacco. He reports previous alcohol use. He reports that he does not use drugs. Allergies: No Known Allergies Medications Prior to Admission  Medication Sig Dispense Refill  . acetaminophen (TYLENOL) 500 MG tablet Take 1,000 mg by mouth every 6 (six) hours as needed for moderate pain or headache.    Marland Kitchen aspirin 81 MG tablet Take 81 mg by mouth daily.    . brimonidine (ALPHAGAN) 0.2 % ophthalmic solution Place 1 drop into both eyes 2 (two) times daily.     . cadexomer iodine (IODOSORB) 0.9 % gel Apply 1 application topically daily as needed for wound care. 40 g 2  . glimepiride (AMARYL) 4 MG tablet Take 4 mg by mouth 2 (two) times daily.    Marland Kitchen losartan (COZAAR) 100 MG tablet Take 100 mg by mouth daily.  3  . metoprolol succinate (TOPROL-XL) 50 MG 24 hr tablet Take 1 tablet (50 mg total) by mouth daily. Take with or immediately following a meal. 30 tablet 3  . Multiple Vitamin (MULTIVITAMIN WITH MINERALS) TABS tablet Take 1 tablet by mouth daily.    . simvastatin (ZOCOR) 40 MG tablet Take 40 mg by mouth daily.    . timolol (TIMOPTIC) 0.5 % ophthalmic solution Place 1 drop into both eyes 2 (two) times daily.  6    Drug Regimen Review Drug regimen was reviewed and remains appropriate with no significant issues identified  Home: Home Living Family/patient expects to be discharged to:: Private residence Living Arrangements: Spouse/significant other Available Help at Discharge: Family, Available 24  hours/day Type of Home: House Home Access: Stairs to enter [employer] of Steps: 5 Entrance Stairs-Rails: Right, Left Home Layout: One level Bathroom Shower/Tub: Health visitor: Handicapped height Home Equipment: Environmental consultant - 2 wheels, Shower seat, Medical laboratory scientific officer - single point, Bedside commode   Functional History: Prior Function Level of Independence: Needs assistance Gait / Transfers Assistance Needed: Able to perform household ambulation with RW ADL's / Homemaking Assistance Needed: Wife assists with sponge bathing at sink and other ADLs as needed (wife herself does HD MWF)  Functional Status:  Mobility: Bed Mobility Overal bed mobility: Needs Assistance Bed Mobility: Supine to Sit Rolling: Mod assist Supine to sit: Max assist, +2 for physical assistance Sit to supine: Min assist General bed mobility comments: Difficulty initiating movement with unaffected limb or arms. Max A for progressing of leg, shifting hips and trunk control.  Transfers Overall transfer level: Needs assistance Equipment used: Rolling walker (2 wheeled) Transfers: Sit to/from Stand, [employer] Transfers Sit to Stand: Total assist, +2 physical assistance Stand pivot transfers: Max assist, +2 physical assistance, From elevated surface Squat pivot transfers: +2 physical assistance, Total assist  Lateral/Scoot Transfers: Mod assist, +2 safety/equipment General transfer comment: Total A +2 for sit to stand with RW, no pt initiation, strong posterior  lean. Total A+2 for squat pivot, no pt initiation.  Ambulation/Gait Ambulation/Gait assistance: Mod assist Gait Distance (Feet): 1 Feet Assistive device: Rolling walker (2 wheeled) Gait Pattern/deviations: Step-to pattern, Trunk flexed, Leaning posteriorly General Gait Details: Took pivotal steps from bed to recliner requiring modA to prevent posterior LOB; cues for sequencing and to prevent pt sitting prematurely. Pt easily distracted not  following commands well Gait velocity: Decreased    ADL: ADL Overall ADL's : Needs assistance/impaired Eating/Feeding: Independent, Sitting Grooming: Wash/dry hands, Wash/dry face, Sitting, Supervision/safety, Set up Grooming Details (indicate cue type and reason): supported sitting in recliner. Poor siting balance unsuported at EOB Upper Body Bathing: Minimal assistance, Sitting Upper Body Bathing Details (indicate cue type and reason): simulated Lower Body Bathing: Maximal assistance, Sitting/lateral leans, Moderate assistance Lower Body Bathing Details (indicate cue type and reason): simulated Upper Body Dressing : Sitting, Min guard Lower Body Dressing: Maximal assistance, Sitting/lateral leans Toilet Transfer: Squat-pivot, Total assistance, +2 for physical assistance, Cueing for safety Toilet Transfer Details (indicate cue type and reason): simulated to recliner Toileting- Clothing Manipulation and Hygiene: Total assistance, Bed level Functional mobility during ADLs: Total assistance, +2 for physical assistance, Cueing for safety  Cognition: Cognition Overall Cognitive Status: Impaired/Different from baseline Orientation Level: Oriented X4 Cognition Arousal/Alertness: Awake/alert Behavior During Therapy: WFL for tasks assessed/performed Overall Cognitive Status: Impaired/Different from baseline Area of Impairment: Attention, Memory, Following commands, Safety/judgement, Awareness, Problem solving Current Attention Level: Selective Memory: Decreased short-term memory Following Commands: Follows one step commands inconsistently, Follows one step commands with increased time Safety/Judgement: Decreased awareness of safety Awareness: Intellectual Problem Solving: Decreased initiation, Requires verbal cues, Requires tactile cues, Slow processing General Comments: Pt taught 2 exercises, tested recall 2 min later and could remember nothing related to exercises. Pt unable to initiate  with mobility, even with unaffected limbs.   Physical Exam: Blood pressure (!) 181/62, pulse 61, temperature 98.7 F (37.1 C), temperature source Oral, resp. rate 18, height 5\' 9"  (1.753 m), weight 73.4 kg, SpO2 98 %. Physical Exam  Vitals reviewed. Constitutional: He appears well-developed and well-nourished.  HENT:  Head: Normocephalic and atraumatic.  Eyes: EOM are normal. Right eye exhibits no discharge. Left eye exhibits no discharge.  Neck: Normal range of motion. Neck supple. No thyromegaly present.  Cardiovascular: Normal rate, regular rhythm and normal heart sounds.  Respiratory: Effort normal and breath sounds normal. No respiratory distress.  GI: Soft. Bowel sounds are normal. He exhibits no distension.  Musculoskeletal:     Comments: Right AKA  Neurological: He is alert.  Provides his name and age.  Some delay in processing.  Follows commands. Motor: Bilateral upper extremities: 4/5 proximal distal Right lower extremity: Hip flexion 4/5 Left lower extremity: Hip flexion, knee extension 3-/5, ankle dorsiflexion 3/5  Skin:  Right AKA site is dressed appropriately tender  Psychiatric: His affect is blunt. His speech is delayed. He is slowed.    Results for orders placed or performed during the hospital encounter of 11/03/18 (from the past 48 hour(s))  Glucose, capillary     Status: Abnormal   Collection Time: 11/13/18 11:51 AM  Result Value Ref Range   Glucose-Capillary 172 (H) 70 - 99 mg/dL   Comment 1 Notify RN    Comment 2 Document in Chart   Glucose, capillary     Status: Abnormal   Collection Time: 11/13/18  4:17 PM  Result Value Ref Range   Glucose-Capillary 183 (H) 70 - 99 mg/dL   Comment 1 Notify RN  Comment 2 Document in Chart   Glucose, capillary     Status: Abnormal   Collection Time: 11/13/18  9:28 PM  Result Value Ref Range   Glucose-Capillary 140 (H) 70 - 99 mg/dL  Glucose, capillary     Status: Abnormal   Collection Time: 11/14/18  7:07 AM   Result Value Ref Range   Glucose-Capillary 125 (H) 70 - 99 mg/dL   Comment 1 Notify RN    Comment 2 Document in Chart   Glucose, capillary     Status: Abnormal   Collection Time: 11/14/18 12:27 PM  Result Value Ref Range   Glucose-Capillary 172 (H) 70 - 99 mg/dL   Comment 1 Notify RN    Comment 2 Document in Chart   Glucose, capillary     Status: Abnormal   Collection Time: 11/14/18  4:46 PM  Result Value Ref Range   Glucose-Capillary 162 (H) 70 - 99 mg/dL   Comment 1 Notify RN    Comment 2 Document in Chart   Glucose, capillary     Status: Abnormal   Collection Time: 11/14/18  9:47 PM  Result Value Ref Range   Glucose-Capillary 127 (H) 70 - 99 mg/dL  Glucose, capillary     Status: Abnormal   Collection Time: 11/15/18  8:04 AM  Result Value Ref Range   Glucose-Capillary 129 (H) 70 - 99 mg/dL   Comment 1 Notify RN   Basic metabolic panel     Status: Abnormal   Collection Time: 11/15/18  9:37 AM  Result Value Ref Range   Sodium 136 135 - 145 mmol/L   Potassium 2.7 (LL) 3.5 - 5.1 mmol/L    Comment: CRITICAL RESULT CALLED TO, READ BACK BY AND VERIFIED WITH: Mariel Aloe RN 1030 11/15/2018 BY A BENNETT    Chloride 102 98 - 111 mmol/L   CO2 26 22 - 32 mmol/L   Glucose, Bld 150 (H) 70 - 99 mg/dL   BUN <5 (L) 8 - 23 mg/dL   Creatinine, Ser 5.36 0.61 - 1.24 mg/dL   Calcium 8.1 (L) 8.9 - 10.3 mg/dL   GFR calc non Af Amer >60 >60 mL/min   GFR calc Af Amer >60 >60 mL/min   Anion gap 8 5 - 15    Comment: Performed at [facility], [address], [place], [place] [address]   No results found.     Medical Problem List and Plan: 1.  Decreased functional mobility secondary to right AKA 11/09/2018 2.  DVT Prophylaxis/Anticoagulation: Subcutaneous heparin. Monitor for any bleeding episodes 3. Pain Management:  Percocet as needed 4. Mood:  Provide emotional support 5. Neuropsych: This patient is capable of making decisions on his own behalf. 6. Skin/Wound Care:  Routine skin  checks 7. Fluids/Electrolytes/Nutrition/hypokalemia:  Routine ins and outs with follow-up chemistries 8. Acute blood loss anemia. Follow-up CBC. Continue iron supplement 9. Hypertension.Norvasc 10 mg daily, Cozaar 100 mg daily, Toprol-XL 50 mg daily. Monitor with increased mobility 10. Diabetes mellitus with peripheral neuropathy. Hemoglobin A1c 6.7.SSI. Patient on Amaryl 4 mg twice a day prior to admission. Resume as needed 11. Hyperlipidemia. Zocor   Post Admission Physician Evaluation: 1. Preadmission assessment reviewed and changes made below. 2. Functional deficits secondary  to right AKA. 3. Patient is admitted to receive collaborative, interdisciplinary care between the physiatrist, rehab nursing staff, and therapy team. 4. Patient's level of medical complexity and substantial therapy needs in context of that medical necessity cannot be provided at a lesser intensity of  care such as a SNF. 5. Patient has experienced substantial functional loss from his/her baseline which was documented above under the "Functional History" and "Functional Status" headings.  Judging by the patient's diagnosis, physical exam, and functional history, the patient has potential for functional progress which will result in measurable gains while on inpatient rehab.  These gains will be of substantial and practical use upon discharge  in facilitating mobility and self-care at the household level. 6. Physiatrist will provide 24 hour management of medical needs as well as oversight of the therapy plan/treatment and provide guidance as appropriate regarding the interaction of the two. 7. 24 hour rehab nursing will assist with bladder management, safety, skin/wound care, disease management, medication administration, pain management and patient education  and help integrate therapy concepts, techniques,education, etc. 8. PT will assess and treat for/with: Lower extremity strength, range of motion, stamina, balance,  functional mobility, safety, adaptive techniques and equipment, wound care, coping skills, pain control, education. Goals are: Min/mod a. 9. OT will assess and treat for/with: ADL's, functional mobility, safety, upper extremity strength, adaptive techniques and equipment, wound mgt, ego support, and community reintegration.   Goals are: Min/mod a. Therapy may not proceed with showering this patient. 10. Case Management and Social Worker will assess and treat for psychological issues and discharge planning. 11. Team conference will be held weekly to assess progress toward goals and to determine barriers to discharge. 12. Patient will receive at least 3 hours of therapy per day at least 5 days per week. 13. ELOS: 18-21 days.       14. Prognosis:  good  I have personally performed a face to face diagnostic evaluation, including, but not limited to relevant history and physical exam findings, of this patient and developed relevant assessment and plan.  Additionally, I have reviewed and concur with the physician assistant's documentation above.  The patient's status has not changed. The original post admission physician evaluation remains appropriate, and any changes from the pre-admission screening or documentation from the acute chart are noted above.    Jeremiah Morrow, MD, ABPMR Mcarthur Rossetti Angiulli, PA-C 11/15/2018

## 2018-11-15 NOTE — Progress Notes (Signed)
Occupational Therapy Treatment Patient Details Name: Jeremiah [name] MRN: [id number] Spice1 DOB: 1945-12-10 Today's Date: 11/15/2018    History of present illness Pt is a 72 y.o. male admitted 11/03/18 with RLE pain and AMS; worked up for sepsis. Xray without osteomyelitis. Head CT without acute abnormality. 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 PMH includes R foot ulcer, DM2, PVD, HTN, CHF, arthritis.   OT comments  This 72 yo male seen to today in conjunction with PT making progress with bed mobility and sit<>stand with use of sara stedy with elevated surfaces but still really weak for transfers and balance thus affecting safety and independence with basic ADLs and transfers. He will continue to benefit from acute OT with follow up on CIR to get to min A level or better to D/C home with wife.  Follow Up Recommendations  CIR    Equipment Recommendations  Other (comment)(TBD next venue)       Precautions / Restrictions Precautions Precautions: Fall Restrictions Weight Bearing Restrictions: Yes RLE Weight Bearing: Non weight bearing       Mobility Bed Mobility Overal bed mobility: Needs Assistance Bed Mobility: Supine to Sit     Supine to sit: Max assist;HOB elevated     General bed mobility comments: cues for sequencing and hand placement; use of chuck pad  Transfers Overall transfer level: Needs assistance     Sit to Stand: Max assist;+2 physical assistance;From elevated surface         General transfer comment: Max A +2 sit<>stand from seat of Corene CorneaSara Stedy with barely clearing buttocks to get seat around under him and out from under him. Unable to maintain standing once seat removed    Balance Overall balance assessment: Needs assistance Sitting-balance support: Bilateral upper extremity supported(LLE supported) Sitting balance-Leahy Scale: Poor Sitting balance - Comments: Pt with tendency to lean left but could self correct with cues and increased time, no LOB just leaning to left    Standing balance support: During functional activity;Bilateral upper extremity supported Standing balance-Leahy Scale: Poor Standing balance comment: Max A +2 to come partially up with use of sara stedy, gait belt and chuck pad under him                           ADL either performed or assessed with clinical judgement   ADL Overall ADL's : Needs assistance/impaired                         Toilet Transfer: Maximal assistance;+2 for physical assistance Toilet Transfer Details (indicate cue type and reason): use of Corene CorneaSara Stedy from raised surface with cues for whole process                 Vision Baseline Vision/History: Wears glasses Patient Visual Report: No change from baseline            Cognition Arousal/Alertness: Awake/alert Behavior During Therapy: WFL for tasks assessed/performed Overall Cognitive Status: Impaired/Different from baseline Area of Impairment: Following commands;Safety/judgement;Problem solving;Attention;Memory                   Current Attention Level: Selective Memory: Decreased short-term memory Following Commands: Follows one step commands with increased time(and increased instruction) Safety/Judgement: Decreased awareness of safety;Decreased awareness of deficits   Problem Solving: Decreased initiation;Requires verbal cues;Requires tactile cues;Difficulty sequencing General Comments: Unable to recall exercises from prior session.  Pertinent Vitals/ Pain       Pain Assessment: Faces Faces Pain Scale: Hurts little more Pain Location: R residual limb once moving about Pain Descriptors / Indicators: Discomfort;Guarding;Grimacing Pain Intervention(s): Monitored during session;Repositioned;Other (comment)(re-wrapped with ace bandage)         Frequency  Min 2X/week        Progress Toward Goals  OT Goals(current goals can now be found in the care plan section)  Progress towards OT  goals: Progressing toward goals     Plan Discharge plan remains appropriate    Co-evaluation    PT/OT/SLP Co-Evaluation/Treatment: Yes Reason for Co-Treatment: To address functional/ADL transfers;For patient/therapist safety PT goals addressed during session: Mobility/safety with mobility;Balance OT goals addressed during session: ADL's and self-care      AM-PAC OT "6 Clicks" Daily Activity     Outcome Measure   Help from another person eating meals?: None Help from another person taking care of personal grooming?: A Little Help from another person toileting, which includes using toliet, bedpan, or urinal?: Total Help from another person bathing (including washing, rinsing, drying)?: A Lot Help from another person to put on and taking off regular upper body clothing?: A Little Help from another person to put on and taking off regular lower body clothing?: Total 6 Click Score: 14    End of Session Equipment Utilized During Treatment: Gait belt  OT Visit Diagnosis: Other abnormalities of gait and mobility (R26.89);Muscle weakness (generalized) (M62.81);Pain;Unsteadiness on feet (R26.81) Pain - Right/Left: Right Pain - part of body: (residual limb)   Activity Tolerance Patient tolerated treatment well   Patient Left in chair;with call bell/phone within reach;with chair alarm set   Nurse Communication Mobility status;Need for lift equipment(written on board (Maxi move for back to bed))        Time: [id number] OT Time Calculation (min): 28 min  Charges: OT General Charges $OT Visit: 1 Visit OT Treatments $Self Care/Home Management : 8-22 mins  Ignacia Palmaathy Montee Tallman, OTR/L [facility] Pager [phone] Office [phone]      Evette GeorgesLeonard, Maizy Davanzo Eva 11/15/2018, 2:02 PM

## 2018-11-15 NOTE — Progress Notes (Signed)
Report given to . Patient in process of being transported to inpatient rehab.

## 2018-11-15 NOTE — Progress Notes (Signed)
Pt arrived to unit @ 1800 via bed. Wife and belongings present at bedside. Side rails up x 3. No c/o pain. Oriented to unit and room. Call bell in reach. Will cont to monitor.   Jeremiah LudwigKAYLA M Jrue Jarriel, LPN

## 2018-11-15 NOTE — Clinical Social Work Note (Addendum)
Per weekend CSW handoff, [facility]. Weekend CSW left voicemail for admissions coordinator to notify. This CSW left a message for admissions coordinator this morning asking her to start insurance authorization.  Charlynn CourtSarah Makenli Derstine, CSW [phone]  10:22 am [facility] [place]. She will review referral again to confirm bed offer and start insurance authorization.  Charlynn CourtSarah Sallyanne Birkhead, CSW [phone]  1:13 pm [facility] for [facility] today.  Charlynn CourtSarah Elazar Argabright, CSW [phone]  4:39 pm Patient discharging to CIR today.  CSW signing off.  Charlynn CourtSarah Casimer Russett, CSW [phone]

## 2018-11-16 ENCOUNTER — Inpatient Hospital Stay (HOSPITAL_COMMUNITY): Payer: Medicare Other | Admitting: Physical Therapy

## 2018-11-16 ENCOUNTER — Inpatient Hospital Stay (HOSPITAL_COMMUNITY): Payer: Medicare Other | Admitting: Occupational Therapy

## 2018-11-16 DIAGNOSIS — Z89611 Acquired absence of right leg above knee: Secondary | ICD-10-CM

## 2018-11-16 DIAGNOSIS — S78111A Complete traumatic amputation at level between right hip and knee, initial encounter: Secondary | ICD-10-CM

## 2018-11-16 DIAGNOSIS — E1142 Type 2 diabetes mellitus with diabetic polyneuropathy: Secondary | ICD-10-CM

## 2018-11-16 DIAGNOSIS — D62 Acute posthemorrhagic anemia: Secondary | ICD-10-CM

## 2018-11-16 DIAGNOSIS — I10 Essential (primary) hypertension: Secondary | ICD-10-CM

## 2018-11-16 LAB — CBC WITH DIFFERENTIAL/PLATELET
Abs Immature Granulocytes: 0.1 10*3/uL — ABNORMAL HIGH (ref 0.00–0.07)
Basophils Absolute: 0.1 10*3/uL (ref 0.0–0.1)
Basophils Relative: 1 %
Eosinophils Absolute: 0.7 10*3/uL — ABNORMAL HIGH (ref 0.0–0.5)
Eosinophils Relative: 6 %
HCT: 26.2 % — ABNORMAL LOW (ref 39.0–52.0)
Hemoglobin: 8.5 g/dL — ABNORMAL LOW (ref 13.0–17.0)
Immature Granulocytes: 1 %
Lymphocytes Relative: 22 %
Lymphs Abs: 2.8 10*3/uL (ref 0.7–4.0)
MCH: 27.2 pg (ref 26.0–34.0)
MCHC: 32.4 g/dL (ref 30.0–36.0)
MCV: 84 fL (ref 80.0–100.0)
Monocytes Absolute: 1.4 10*3/uL — ABNORMAL HIGH (ref 0.1–1.0)
Monocytes Relative: 11 %
Neutro Abs: 7.8 10*3/uL — ABNORMAL HIGH (ref 1.7–7.7)
Neutrophils Relative %: 59 %
Platelets: 453 10*3/uL — ABNORMAL HIGH (ref 150–400)
RBC: 3.12 MIL/uL — ABNORMAL LOW (ref 4.22–5.81)
RDW: 14.6 % (ref 11.5–15.5)
WBC: 12.8 10*3/uL — ABNORMAL HIGH (ref 4.0–10.5)
nRBC: 0 % (ref 0.0–0.2)

## 2018-11-16 LAB — COMPREHENSIVE METABOLIC PANEL
ALT: 13 U/L (ref 0–44)
AST: 15 U/L (ref 15–41)
Albumin: 1.8 g/dL — ABNORMAL LOW (ref 3.5–5.0)
Alkaline Phosphatase: 77 U/L (ref 38–126)
Anion gap: 14 (ref 5–15)
BUN: 5 mg/dL — ABNORMAL LOW (ref 8–23)
CO2: 22 mmol/L (ref 22–32)
Calcium: 8.2 mg/dL — ABNORMAL LOW (ref 8.9–10.3)
Chloride: 100 mmol/L (ref 98–111)
Creatinine, Ser: 0.85 mg/dL (ref 0.61–1.24)
GFR calc Af Amer: >60 mL/min (ref >60)
GFR calc non Af Amer: >60 mL/min (ref >60)
Glucose, Bld: 119 mg/dL — ABNORMAL HIGH (ref 70–99)
Potassium: 3.9 mmol/L (ref 3.5–5.1)
Sodium: 136 mmol/L (ref 135–145)
Total Bilirubin: 0.7 mg/dL (ref 0.3–1.2)
Total Protein: 6.4 g/dL — ABNORMAL LOW (ref 6.5–8.1)

## 2018-11-16 LAB — GLUCOSE, CAPILLARY
Glucose-Capillary: 107 mg/dL — ABNORMAL HIGH (ref 70–99)
Glucose-Capillary: 149 mg/dL — ABNORMAL HIGH (ref 70–99)
Glucose-Capillary: 139 mg/dL — ABNORMAL HIGH (ref 70–99)
Glucose-Capillary: 123 mg/dL — ABNORMAL HIGH (ref 70–99)

## 2018-11-16 NOTE — Progress Notes (Signed)
[facility] Progress Note Patient Details:  Jeremiah [name] 11-28-1945 [id number]  Patient ID: Jeremiah [name], male   DOB: 11-28-1945, 72 y.o.   MRN: [id number]   [name] 11/16/2018, 9:59 AMCalled [employer] for right AKA shrinker x2.

## 2018-11-16 NOTE — Care Management Note (Signed)
[facility]patient [facility] Individual Statement of Services  Patient Name:  Jeremiah [name]  Date:  11/16/2018  Welcome to the [facility]patient [facility].  Our goal is to provide you with an individualized program based on your diagnosis and situation, designed to meet your specific needs.  With this comprehensive rehabilitation program, you will be expected to participate in at least 3 hours of rehabilitation therapies Monday-Friday, with modified therapy programming on the weekends.  Your rehabilitation program will include the following services:  Physical Therapy (PT), Occupational Therapy (OT), 24 hour per day rehabilitation nursing, Therapeutic Recreaction (TR), Case Management (Social Worker), Rehabilitation Medicine, Nutrition Services and Pharmacy Services  Weekly team conferences will be held on Wednesday to discuss your progress.  Your Social Worker will talk with you frequently to get your input and to update you on team discussions.  Team conferences with you and your family in attendance may also be held.  Expected length of stay: 21-23 days   Overall anticipated outcome: supervision-min assist level  Depending on your progress and recovery, your program may change. Your Social Worker will coordinate services and will keep you informed of any changes. Your Social Worker's name and contact numbers are listed  below.  The following services may also be recommended but are not provided by the Inpatient Rehabilitation Center:   Home Health Rehabiltiation Services  Outpatient Rehabilitation Services    Arrangements will be made to provide these services after discharge if needed.  Arrangements include referral to agencies that provide these services.  Your insurance has been verified to be:  [employer]Medicare Your primary doctor is:  Rinaldo CloudMohan Harwani  Pertinent information will be shared with your doctor and your insurance company.  Social Worker:  Dossie DerBecky Anaysia Germer, SW  [phone] or [phone]) [phone]  Information discussed with and copy given to patient by: Lucy Chrisupree, Balinda Heacock G, 11/16/2018, 9:12 AM

## 2018-11-16 NOTE — Progress Notes (Signed)
Patient information reviewed and entered into eRehab system by Tora DuckMarie Gennie Eisinger, RN, CRRN, PPS Coordinator.  Information including medical coding, functional ability and quality indicators will be reviewed and updated through discharge.     Per nursing patient and wife were given "Data Collection Information Summary for Patients in Inpatient Rehabilitation Facilities with attached "Privacy Act Statement-Health Care Records" upon admission.

## 2018-11-16 NOTE — Plan of Care (Signed)
  Problem: RH BOWEL ELIMINATION Goal: RH STG MANAGE BOWEL WITH ASSISTANCE Description STG Manage Bowel with Assistance. Outcome: Progressing Goal: RH STG MANAGE BOWEL W/MEDICATION W/ASSISTANCE Description STG Manage Bowel with Medication with Assistance. Outcome: Progressing   Problem: RH BLADDER ELIMINATION Goal: RH STG MANAGE BLADDER WITH ASSISTANCE Description STG Manage Bladder With Assistance Outcome: Progressing Goal: RH STG MANAGE BLADDER WITH MEDICATION WITH ASSISTANCE Description STG Manage Bladder With Medication With Assistance. Outcome: Progressing Goal: RH STG MANAGE BLADDER WITH EQUIPMENT WITH ASSISTANCE Description STG Manage Bladder With Equipment With Assistance Outcome: Progressing   Problem: RH SKIN INTEGRITY Goal: RH STG SKIN FREE OF INFECTION/BREAKDOWN Outcome: Progressing Goal: RH STG MAINTAIN SKIN INTEGRITY WITH ASSISTANCE Description STG Maintain Skin Integrity With Assistance. Outcome: Progressing Goal: RH STG ABLE TO PERFORM INCISION/WOUND CARE W/ASSISTANCE Description STG Able To Perform Incision/Wound Care With Assistance. Outcome: Progressing   Problem: RH SAFETY Goal: RH STG ADHERE TO SAFETY PRECAUTIONS W/ASSISTANCE/DEVICE Description STG Adhere to Safety Precautions With Assistance/Device. Outcome: Progressing Goal: RH STG DECREASED RISK OF FALL WITH ASSISTANCE Description STG Decreased Risk of Fall With Assistance. Outcome: Progressing   Problem: RH PAIN MANAGEMENT Goal: RH STG PAIN MANAGED AT OR BELOW PT'S PAIN GOAL Outcome: Progressing   Problem: RH KNOWLEDGE DEFICIT LIMB LOSS Goal: RH STG INCREASE KNOWLEDGE OF SELF CARE AFTER LIMB LOSS Outcome: Progressing

## 2018-11-16 NOTE — Progress Notes (Signed)
Occupational Therapy Assessment and Plan  Patient Details  Name: MORDECAI TINDOL MRN: [id number] Date of Birth: 10/18/1946  OT Diagnosis: muscle weakness (generalized) and R AKA Rehab Potential: Rehab Potential (ACUTE ONLY): Fair ELOS: 3-3.5 weeks   Today's Date: 11/16/2018 OT Individual Time: 0700-0800 OT Individual Time Calculation (min): 60 min     Problem List:  Patient Active Problem List   Diagnosis Date Noted  . Right above-knee amputee ([place]) 11/15/2018  . Postoperative pain   . Acute blood loss anemia   . Dyslipidemia   . Type 2 diabetes mellitus with peripheral neuropathy (HCC)   . S/P AKA (above knee amputation) ([place]) 11/12/2018  . Unilateral AKA, right ([place])   . Hypertensive crisis   . Diabetes mellitus type 2 in nonobese (HCC)   . Pleural effusion 11/08/2018  . Fever   . Open wound of right foot   . Sepsis ([place]) 11/03/2018  . Altered mental status   . Acute cystitis without hematuria   . Leukocytosis   . Pressure injury of skin 07/08/2018  . Clostridium difficile colitis 07/07/2018  . Hypokalemia 07/07/2018  . HLD (hyperlipidemia) 07/07/2018  . Acute diverticulitis 06/04/2018  . Cholecystitis 05/24/2018  . Cholecystitis with cholelithiasis 05/21/2018  . Acute systolic heart failure ([place]) 03/27/2018  . Diabetes mellitus without complication ([place]) 83/15/1761  . Hypertension 01/30/2018  . Acute cholecystitis 01/30/2018  . AKI (acute kidney injury) ([place]) 01/30/2018  . Normocytic anemia 01/30/2018  . PVD (peripheral vascular disease) ([place]) 09/05/2014    Past Medical History:  Past Medical History:  Diagnosis Date  . Arthritis    "joints; shoulders, knees, hands, back" (05/21/2018)  . C. difficile diarrhea 04/2018  . Diastolic CHF ([place])   . High cholesterol   . History of gout   . Hypertension   . Peripheral vascular disease ([place])   . Pneumonia    "couple times" (05/21/2018)  . Sleep apnea    "has mask; won't use" (05/21/2018)  . Type II diabetes  mellitus ([place])    Past Surgical History:  Past Surgical History:  Procedure Laterality Date  . ABDOMINAL AORTOGRAM W/LOWER EXTREMITY N/A 11/01/2018   Procedure: ABDOMINAL AORTOGRAM W/LOWER EXTREMITY;  Surgeon: Lorretta Harp, MD;  Location: [facility];  Service: Cardiovascular;  Laterality: N/A;  . AMPUTATION Right 11/09/2018   Procedure: RIGHT - AMPUTATION ABOVE KNEE;  Surgeon: Waynetta Sandy, MD;  Location: [facility];  Service: Vascular;  Laterality: Right;  . CATARACT EXTRACTION W/ INTRAOCULAR LENS  IMPLANT, BILATERAL Bilateral   . CHOLECYSTECTOMY  05/21/2018   ATTEMPTED LAPAROSCOPIC CHOLECYSTECTOMY, OPEN DRAINAGE OF GALLBLADDER WITH BIOPSY  . CHOLECYSTECTOMY N/A 05/21/2018   Procedure: ATTEMPTED LAPAROSCOPIC CHOLECYSTECTOMY, OPEN DRAINAGE OF GALLBLADDER WITH BIOPSY;  Surgeon: Jovita Kussmaul, MD;  Location: [facility];  Service: General;  Laterality: N/A;  . COLONOSCOPY W/ POLYPECTOMY    . COLONOSCOPY WITH PROPOFOL N/A 09/16/2018   Procedure: COLONOSCOPY WITH PROPOFOL;  Surgeon: Wilford Corner, MD;  Location: [facility];  Service: Endoscopy;  Laterality: N/A;  . FECAL TRANSPLANT N/A 09/16/2018   Procedure: FECAL TRANSPLANT;  Surgeon: Wilford Corner, MD;  [facility]: [facility];  Service: Endoscopy;  Laterality: N/A;  . IR CATHETER TUBE CHANGE  04/14/2018  . IR CHOLANGIOGRAM EXISTING TUBE  03/17/2018  . IR PERC CHOLECYSTOSTOMY  01/31/2018  . IR RADIOLOGIST EVAL & MGMT  03/02/2018    Assessment & Plan Clinical Impression: JERETT ODONOHUE do is a 72 year old right hand male with history of diastolic congestive heart failure,  hypertension, recurrent Clostridium difficile infection, type 2 diabetes mellitus. History taken from chart review and family. Per chart review patient lives with spouse. One level home 5 steps to entry. He was able to perform household ambulation with a rolling walker. Wife does assist with some basic ADLs. Wife does receive hemodialysis 3 days a week. 2  sons in the area work. Presented 11/04/2018 with ischemic ulceration right heel as well as altered mental status. Leukocytosis 16,500 and low-grade fever. Cranial CT scan reviewed, unremarkable for acute intracranial process.  X-rays of right foot showed no evidence of osteomyelitis. Blood cultures Proteus Mirabalis and maintain on anabolic therapy. 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 Servando Snare. Hospital course pain management. Acute blood loss anemia 7.2 with plan for transfusion, leukocytosis improving 13,600. Hypokalemia 2.7 with supplement added with follow-up labs. Antibiotic therapy completed 11/11/2018. Stump sock placed per vascular surgery/[employer]. Subcutaneous heparin for DVT prophylaxis.Therapy evaluations completed with recommendations of physical medicine rehabilitation consult. Patient was admitted for a comp relative rehabilitation program.  Discussed with insurance physician Patient transferred to CIR on 11/15/2018 .    Patient currently requires total with basic self-care skills secondary to muscle weakness, decreased cardiorespiratoy endurance, decreased awareness, decreased problem solving, decreased safety awareness, decreased memory and delayed processing and decreased sitting balance, decreased standing balance, decreased postural control and decreased balance strategies.  Prior to hospitalization, patient could complete ADLs with min.  Patient will benefit from skilled intervention to decrease level of assist with basic self-care skills and increase independence with basic self-care skills prior to discharge home with care partner.  Anticipate patient will require minimal physical assistance and follow up home health.  OT - End of Session Activity Tolerance: Tolerates 10 - 20 min activity with multiple rests Endurance Deficit: Yes Endurance Deficit Description: Requires multiple rest breaks during bed level mobility OT  Assessment Rehab Potential (ACUTE ONLY): Fair OT Barriers to Discharge: Decreased caregiver support OT Patient demonstrates impairments in the following area(s): Balance;Pain;Cognition;Safety;Endurance;Motor;Skin Integrity OT Basic ADL's Functional Problem(s): Bathing;Grooming;Toileting;Dressing OT Transfers Functional Problem(s): Toilet OT Additional Impairment(s): None OT Plan OT Intensity: Minimum of 1-2 x/day, 45 to 90 minutes OT Frequency: 5 out of 7 days OT Duration/Estimated Length of Stay: 3-3.5 weeks OT Treatment/Interventions: Balance/vestibular training;Disease mangement/prevention;Neuromuscular re-education;Patient/family education;Self Care/advanced ADL retraining;Splinting/orthotics;Therapeutic Exercise;UE/LE Coordination activities;Wheelchair propulsion/positioning;Cognitive remediation/compensation;Discharge planning;DME/adaptive equipment instruction;Functional mobility training;Pain management;Psychosocial support;Skin care/wound managment;Therapeutic Activities;UE/LE Strength taining/ROM OT Self Feeding Anticipated Outcome(s): Indep OT Basic Self-Care Anticipated Outcome(s): Min A OT Toileting Anticipated Outcome(s): Min A OT Bathroom Transfers Anticipated Outcome(s): Min A OT Recommendation Recommendations for Other Services: Speech consult;Neuropsych consult Patient destination: Home Follow Up Recommendations: Home health OT;24 hour supervision/assistance Equipment Recommended: To be determined   Skilled Therapeutic Intervention Pt seen for OT eval and ADL bathing/dressing session. Pt in supine upon arrival, awake and agreeable to tx session and denying pain. He transferred to EOB with mod A using hospital bed functions, max multi-modal cuing for sequencing and technique. He was able to maintain static sitting balance EOB with supervision. Attempted squat pivot transfer to w/c, however, pt unable to complete anterior weightshft and power through L LE needed to initiate  transfer despite +2 assist. Then trialed use of STEDY, despite having bed fully elevated and +2 assist, pt unable to clear buttock in order to put seat pannels down. Pt's L LE noted to be in flexor withdrawel like movement pattern and despite total A from therapist in attempt to position L LE  correctly into lift. +3 assist to safely transition from STEDY and back onto bed. Pt returned to supine and pants donned total A, pt able to complete partial bridge in order to assist with pulling pants up.  He transferred back to EOB with max A. Completed sliding board transfer to w/c with mod-max A+2 with max cuing for technique and hand placement. Pt demonstrating motor planning deficits during transfer. He completed grooming tasks from w/c level at sink with supervision/set-up and VCs.  Pt left seated in w/c at end of session with chair belt alarm donned and all needs in reach.  Education provided throughout session regarding role of OT, POC, and d/c planning.   OT Evaluation Precautions/Restrictions  Precautions Precautions: Fall Restrictions Weight Bearing Restrictions: Yes RLE Weight Bearing: Non weight bearing Pain   No/denies pain Home Living/Prior Functioning Home Living Family/patient expects to be discharged to:: Private residence Living Arrangements: Spouse/significant other Available Help at Discharge: Family, Available 24 hours/day Type of Home: House Home Access: Stairs to enter, Ramped entrance(Pt reports family building ramp) Technical brewer of Steps: 5 Entrance Stairs-Rails: Right, Left Home Layout: One level Bathroom Shower/Tub: Walk-in shower Additional Comments: Pt has been sponge bathing since Feb. Pt reports house is w/c accessibile. Cont to ask set-up when family present  Lives With: Spouse IADL History Homemaking Responsibilities: No Current License: Yes Type of Occupation: Retired Prior Function Level of Independence: Independent with gait, Independent with  transfers, Requires assistive device for independence, Needs assistance with ADLs  Able to Take Stairs?: Yes Driving: No Vocation: Retired Comments: uses RW for all mobility and wife helps as needed Vision Baseline Vision/History: Wears glasses Patient Visual Report: No change from baseline Perception  Perception: Within Functional Limits Praxis Praxis: Impaired Praxis Impairment Details: Motor planning Cognition Overall Cognitive Status: Impaired/Different from baseline Arousal/Alertness: Awake/alert Orientation Level: Person;Place;Situation Person: Oriented Place: Oriented Situation: Oriented Year: 2019 Month: December Day of Week: Correct Memory: Impaired Memory Impairment: Retrieval deficit;Decreased recall of new information;Decreased short term memory Immediate Memory Recall: Sock;Blue;Bed Memory Recall: Sock;Blue;Bed Memory Recall Sock: Without Cue Memory Recall Blue: With Cue Memory Recall Bed: Without Cue Attention: Focused Focused Attention: Impaired Awareness: Impaired Awareness Impairment: Emergent impairment;Anticipatory impairment Problem Solving: Impaired Safety/Judgment: Impaired Comments: Decreased safety awareness with decreased awareness of deficits and functional implications.  Sensation Sensation Light Touch: Appears Intact Proprioception: Impaired Detail Proprioception Impaired Details: Impaired LLE Coordination Gross Motor Movements are Fluid and Coordinated: No Coordination and Movement Description: Impaired due to new AKA and generalized weakness/deconditioning Heel Shin Test: unable to perform B Motor  Motor Motor: Other (comment) Motor - Skilled Clinical Observations: Impaired due to Generalized weakness Mobility  Bed Mobility Bed Mobility: Rolling Right;Rolling Left;Supine to Sit;Sit to Supine Rolling Right: Maximal Assistance - Patient 25-49% Rolling Left: Maximal Assistance - Patient 25-49% Supine to Sit: Maximal Assistance -  Patient - Patient 25-49% Sit to Supine: Maximal Assistance - Patient 25-49%  Trunk/Postural Assessment  Cervical Assessment Cervical Assessment: Exceptions to WFL(Forward head) Thoracic Assessment Thoracic Assessment: Exceptions to WFL(Rounded shoulders) Lumbar Assessment Lumbar Assessment: Exceptions to WFL(Posterior pelvic tilt) Postural Control Postural Control: Deficits on evaluation Righting Reactions: delayed  Balance Balance Balance Assessed: Yes Static Sitting Balance Static Sitting - Balance Support: Feet supported;Right upper extremity supported;Left upper extremity supported Static Sitting - Level of Assistance: 4: Min assist;5: Stand by assistance Static Sitting - Comment/# of Minutes: Sitting EOB Dynamic Sitting Balance Dynamic Sitting - Balance Support: Bilateral upper extremity supported;During functional activity Dynamic Sitting - Level of Assistance:  4: Min assist Sitting balance - Comments: Sitting EOB Static Standing Balance Static Standing - Balance Support: Bilateral upper extremity supported Static Standing - Level of Assistance: 1: +2 Total assist Static Standing - Comment/# of Minutes: Attempted to stand in STEDY. Unable to achieve erect posture and clear buttock for pads to be palced despite +2 total A Dynamic Standing Balance Dynamic Standing - Level of Assistance: Not tested (comment) Dynamic Standing - Comments: Unable to acheive standing posture Extremity/Trunk Assessment RUE Assessment RUE Assessment: Within Functional Limits LUE Assessment LUE Assessment: Within Functional Limits     Refer to Care Plan for Long Term Goals  Recommendations for other services: None    Discharge Criteria: Patient will be discharged from OT if patient refuses treatment 3 consecutive times without medical reason, if treatment goals not met, if there is a change in medical status, if patient makes no progress towards goals or if patient is discharged from  hospital.  The above assessment, treatment plan, treatment alternatives and goals were discussed and mutually agreed upon: by patient  Jacqulyn Barresi L 11/16/2018, 12:40 PM

## 2018-11-16 NOTE — Progress Notes (Signed)
Social Work  Social Work Assessment and Plan  Patient Details  Name: Jeremiah [name] MRN: [id number] Date of Birth: 1946-11-22  Today's Date: 11/16/2018  Problem List:  Patient Active Problem List   Diagnosis Date Noted  . 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 Postoperative pain   . Acute blood loss anemia   . Dyslipidemia   . Type 2 diabetes mellitus with peripheral neuropathy (HCC)   . S/P AKA (above knee amputation) (HCC) 11/12/2018  . Unilateral AKA, right (HCC)   . Hypertensive crisis   . Diabetes mellitus type 2 in nonobese (HCC)   . Pleural effusion 11/08/2018  . Fever   . Open wound of right foot   . Sepsis (HCC) 11/03/2018  . Altered mental status   . Acute cystitis without hematuria   . Leukocytosis   . Pressure injury of skin 07/08/2018  . Clostridium difficile colitis 07/07/2018  . Hypokalemia 07/07/2018  . HLD (hyperlipidemia) 07/07/2018  . Acute diverticulitis 06/04/2018  . Cholecystitis 05/24/2018  . Cholecystitis with cholelithiasis 05/21/2018  . Acute systolic heart failure (HCC) 03/27/2018  . Diabetes mellitus without complication (HCC) 01/30/2018  . Hypertension 01/30/2018  . Acute cholecystitis 01/30/2018  . AKI (acute kidney injury) (HCC) 01/30/2018  . Normocytic anemia 01/30/2018  . PVD (peripheral vascular disease) ([facility]CC) 09/05/2014   Past Medical History:  Past Medical History:  Diagnosis Date  . Arthritis    "joints; shoulders, knees, hands, back" (05/21/2018)  . C. difficile diarrhea 04/2018  . Diastolic CHF (HCC)   . High cholesterol   . History of gout   . Hypertension   . Peripheral vascular disease (HCC)   . Pneumonia    "couple times" (05/21/2018)  . Sleep apnea    "has mask; won't use" (05/21/2018)  . Type II diabetes mellitus (HCC)    Past Surgical History:  Past Surgical History:  Procedure Laterality Date  . ABDOMINAL AORTOGRAM W/LOWER EXTREMITY N/A 11/01/2018   Procedure: ABDOMINAL AORTOGRAM W/LOWER EXTREMITY;   Surgeon: Runell GessBerry, Jonathan J, MD;  [facility];  Service: Cardiovascular;  Laterality: N/A;  . AMPUTATION Right 11/09/2018   Procedure: RIGHT - AMPUTATION ABOVE KNEE;  Surgeon: Maeola Harmanain, Brandon Christopher, MD;  [facility];  Service: Vascular;  Laterality: Right;  . CATARACT EXTRACTION W/ INTRAOCULAR LENS  IMPLANT, BILATERAL Bilateral   . CHOLECYSTECTOMY  05/21/2018   ATTEMPTED LAPAROSCOPIC CHOLECYSTECTOMY, OPEN DRAINAGE OF GALLBLADDER WITH BIOPSY  . CHOLECYSTECTOMY N/A 05/21/2018   Procedure: ATTEMPTED LAPAROSCOPIC CHOLECYSTECTOMY, OPEN DRAINAGE OF GALLBLADDER WITH BIOPSY;  Surgeon: Griselda Mineroth, Paul III, MD;  Location: [facility];  Service: General;  Laterality: N/A;  . COLONOSCOPY W/ POLYPECTOMY    . COLONOSCOPY WITH PROPOFOL N/A 09/16/2018   Procedure: COLONOSCOPY WITH PROPOFOL;  Surgeon: Charlott RakesSchooler, Vincent, MD;  [facility];  Service: Endoscopy;  Laterality: N/A;  . 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 IR CATHETER TUBE CHANGE  04/14/2018  . IR CHOLANGIOGRAM EXISTING TUBE  03/17/2018  . IR PERC CHOLECYSTOSTOMY  01/31/2018  . IR RADIOLOGIST EVAL & MGMT  03/02/2018   Social History:  reports that he quit smoking about 35 years ago. His smoking use included cigarettes. He quit after 24.00 years of use. He has never used smokeless tobacco. He reports previous alcohol use. He reports that he does not use drugs.  Family / Support Systems Marital Status: Married Patient Roles: Spouse, Parent  Spouse/Significant Other: Jamesetta Sohyllis [phone]-cell Children: Brian-son [phone]  Bronson-son [phone] Other Supports: Friends and church members Anticipated Caregiver: Wife and son Ability/Limitations of Caregiver: Wife is a HD pt-M-W-F and local son-Bronson Caregiver Availability: Other (Comment)(May be alone at times) Family Dynamics: Close knit family who  depend upon on one another. Local son is involved and other son is in [place]. They have friends and church members who are supportive.  Social History Preferred language: English Religion: Methodist Cultural Background: No issues Education: [employer] Read: Yes Write: Yes Employment Status: Retired Marine scientistLegal History/Current Legal Issues: No issues Guardian/Conservator: None-according to MD pt is capable of making his own decisions while here   Abuse/Neglect Abuse/Neglect Assessment Can Be Completed: Yes Physical Abuse: Denies Verbal Abuse: Denies Sexual Abuse: Denies Exploitation of patient/patient's resources: Denies Self-Neglect: Denies  Emotional Status Pt's affect, behavior and adjustment status: Pt is motivated but concerned his leg is weak and he has not been able to stand on it. He has always been independent and mobile. He does not want to burden wife and wants to at least get mobile she can assist with his ADL's. Recent Psychosocial Issues: other health issues managed by PCP Psychiatric History: No history deferred depression screen due to coping appropriately and adjusting to the new unit. Will monitor and see if needs to see neuro-psych while here Substance Abuse History: No issues  Patient / Family Perceptions, Expectations & Goals Pt/Family understanding of illness & functional limitations: Pt and wife can explain his amputation, both are hopeful he will do well while here. Both talk with the MD and feel they have a good understanding of his treatment plan going forward.  Wife plans to be here often to see him in therapies. Premorbid pt/family roles/activities: husband, father, grandfather, retiree, church member, choir member Anticipated changes in roles/activities/participation: resume Pt/family expectations/goals: Pt states: " I want to be able to move around even if I am in a wheelchair by the time I leave here."  Wife states: " I hope he can move and be mobile, I  can't lift him."  [employer] Community Agencies: Other (Comment)(had in past) Premorbid Home Care/DME Agencies: Other (Comment)(had in past) Transportation available at discharge: Wife and son's Resource referrals recommended: Support group (specify)  Discharge Planning Living Arrangements: Spouse/significant other Support Systems: Spouse/significant other, Children, Friends/neighbors, Church/faith community Type of Residence: Private residence Insurance Resources: Media plannerrivate Insurance (specify)(UHC-Medicare) Surveyor, quantityinancial Resources: Restaurant manager, fast foodocial Security Financial Screen Referred: No Living Expenses: Lives with family Money Management: Patient, Spouse Does the patient have any problems obtaining your medications?: No Home Management: Wife Patient/Family Preliminary Plans: Return home with wife who can assist some but not physically lift or assist. Local son can check on daily but does work. Will work on discharge needs and await team evaluations. [place] Barriers to Discharge: Decreased caregiver support Sw Barriers to Discharge Comments: Wife is limited in her care she can provide Social Work Anticipated Follow Up Needs: HH/OP, Support Group  Clinical Impression Pleasant gentleman who is willing to work hard and regain his independence. His wife is involved but has limitations of the amount of care she can provide. Local son works but can assist some. Will await team's evaluations and work on a safe discharge plan.  Lucy Chrisupree, Lary Eckardt G 11/16/2018, 9:10 AM

## 2018-11-16 NOTE — Plan of Care (Signed)
  Problem: RH BOWEL ELIMINATION Goal: RH STG MANAGE BOWEL WITH ASSISTANCE Description STG Manage Bowel with Assistance. Outcome: Progressing Goal: RH STG MANAGE BOWEL W/MEDICATION W/ASSISTANCE Description STG Manage Bowel with Medication with Assistance. Outcome: Progressing   Problem: RH BLADDER ELIMINATION Goal: RH STG MANAGE BLADDER WITH ASSISTANCE Description STG Manage Bladder With Assistance Outcome: Progressing Goal: RH STG MANAGE BLADDER WITH MEDICATION WITH ASSISTANCE Description STG Manage Bladder With Medication With Assistance. Outcome: Progressing Goal: RH STG MANAGE BLADDER WITH EQUIPMENT WITH ASSISTANCE Description STG Manage Bladder With Equipment With Assistance Outcome: Progressing   Problem: RH SKIN INTEGRITY Goal: RH STG SKIN FREE OF INFECTION/BREAKDOWN Outcome: Progressing Goal: RH STG MAINTAIN SKIN INTEGRITY WITH ASSISTANCE Description STG Maintain Skin Integrity With Assistance. Outcome: Progressing Goal: RH STG ABLE TO PERFORM INCISION/WOUND CARE W/ASSISTANCE Description STG Able To Perform Incision/Wound Care With Assistance. Outcome: Progressing   Problem: RH SAFETY Goal: RH STG ADHERE TO SAFETY PRECAUTIONS W/ASSISTANCE/DEVICE Description STG Adhere to Safety Precautions With Assistance/Device. Outcome: Progressing Goal: RH STG DECREASED RISK OF FALL WITH ASSISTANCE Description STG Decreased Risk of Fall With Assistance. Outcome: Progressing   Problem: RH PAIN MANAGEMENT Goal: RH STG PAIN MANAGED AT OR BELOW PT'S PAIN GOAL Outcome: Progressing   Problem: RH KNOWLEDGE DEFICIT LIMB LOSS Goal: RH STG INCREASE KNOWLEDGE OF SELF CARE AFTER LIMB LOSS Outcome: Progressing   

## 2018-11-16 NOTE — Progress Notes (Signed)
Physical Therapy Session Note  Patient Details  Name: Jeremiah [name] MRN: [id number] Date of Birth: [birth year]  Today's Date: 11/16/2018 PT Individual Time: 1330-1430 PT Individual Time Calculation (min): 60 min   Short Term Goals: Week 1:  PT Short Term Goal 1 (Week 1): Pt will complete bed mobility with mod A PT Short Term Goal 2 (Week 1): Pt will maintain sitting balance EOB with SBA consistently PT Short Term Goal 3 (Week 1): Pt will propel manual w/c x 100 ft with min A PT Short Term Goal 4 (Week 1): Pt will initiate standing as safe and able  Skilled Therapeutic Interventions/Progress Updates: pt received seated in w/c, denies pain and agreeable to treatment. Grooming at sink with setupA. W/c propulsion 2x100' with BUE and min guard faded to S, cues for technique and hand placement for more efficient technique. Provided pt with w/c gloves for improved grip. Pt with urinary urgency; therapist obtained urinal and pt voided in sitting while in w/c however had already voided some before therapist able to place urinal. Transfer w/c <>mat table maxA with transfer board, cues for head/hips technique. Returned to room to change brief/pants. Lateral scoot maxA w/c>bed with transfer board. Rolling R/L modA to remove brief and pants, don clean brief totalA. Pt verbalizes "my wife could do that if she watched you do it once" and then later states "I think I may just get out of here soon"; educated pt on goals of reducing caregiver burden and improving strength/endurance prior to d/c home. Remained supine in bed at end of session, all needs in reach and alarm intact.      Therapy Documentation Precautions:  Precautions Precautions: Fall Restrictions Weight Bearing Restrictions: Yes RLE Weight Bearing: Non weight bearing    Therapy/Group: Individual Therapy  Harlon Dittylizabeth J Armin Yerger 11/16/2018, 2:27 PM

## 2018-11-16 NOTE — Progress Notes (Signed)
Physical Therapy Assessment and Plan  Patient Details  Name: Jeremiah [name] MRN: [id number] Date of Birth: Nov 08, 1946  PT Diagnosis: Difficulty walking, Impaired cognition and Muscle weakness Rehab Potential: Fair ELOS: 21-24 days   Today's Date: 11/16/2018 PT Individual Time: 1000-1100 PT Individual Time Calculation (min): 60 min    Problem List:  Patient Active Problem List   Diagnosis Date Noted  . Right above-knee amputee ([place]) 11/15/2018  . Postoperative pain   . Acute blood loss anemia   . Dyslipidemia   . Type 2 diabetes mellitus with peripheral neuropathy (HCC)   . S/P AKA (above knee amputation) ([place]) 11/12/2018  . Unilateral AKA, right ([place])   . Hypertensive crisis   . Diabetes mellitus type 2 in nonobese (HCC)   . Pleural effusion 11/08/2018  . Fever   . Open wound of right foot   . Sepsis ([place]) 11/03/2018  . Altered mental status   . Acute cystitis without hematuria   . Leukocytosis   . Pressure injury of skin 07/08/2018  . Clostridium difficile colitis 07/07/2018  . Hypokalemia 07/07/2018  . HLD (hyperlipidemia) 07/07/2018  . Acute diverticulitis 06/04/2018  . Cholecystitis 05/24/2018  . Cholecystitis with cholelithiasis 05/21/2018  . Acute systolic heart failure ([place]) 03/27/2018  . Diabetes mellitus without complication ([place]) 62/13/0865  . Hypertension 01/30/2018  . Acute cholecystitis 01/30/2018  . AKI (acute kidney injury) ([place]) 01/30/2018  . Normocytic anemia 01/30/2018  . PVD (peripheral vascular disease) ([place]) 09/05/2014    Past Medical History:  Past Medical History:  Diagnosis Date  . Arthritis    "joints; shoulders, knees, hands, back" (05/21/2018)  . C. difficile diarrhea 04/2018  . Diastolic CHF ([place])   . High cholesterol   . History of gout   . Hypertension   . Peripheral vascular disease ([place])   . Pneumonia    "couple times" (05/21/2018)  . Sleep apnea    "has mask; won't use" (05/21/2018)  . Type II diabetes mellitus ([place])     Past Surgical History:  Past Surgical History:  Procedure Laterality Date  . ABDOMINAL AORTOGRAM W/LOWER EXTREMITY N/A 11/01/2018   Procedure: ABDOMINAL AORTOGRAM W/LOWER EXTREMITY;  Surgeon: Lorretta Harp, MD;  Location: [facility];  Service: Cardiovascular;  Laterality: N/A;  . AMPUTATION Right 11/09/2018   Procedure: RIGHT - AMPUTATION ABOVE KNEE;  Surgeon: Waynetta Sandy, MD;  Location: [facility];  Service: Vascular;  Laterality: Right;  . CATARACT EXTRACTION W/ INTRAOCULAR LENS  IMPLANT, BILATERAL Bilateral   . CHOLECYSTECTOMY  05/21/2018   ATTEMPTED LAPAROSCOPIC CHOLECYSTECTOMY, OPEN DRAINAGE OF GALLBLADDER WITH BIOPSY  . CHOLECYSTECTOMY N/A 05/21/2018   Procedure: ATTEMPTED LAPAROSCOPIC CHOLECYSTECTOMY, OPEN DRAINAGE OF GALLBLADDER WITH BIOPSY;  Surgeon: Jovita Kussmaul, MD;  Location: [place] [facility];  Service: General;  Laterality: N/A;  . COLONOSCOPY W/ POLYPECTOMY    . COLONOSCOPY WITH PROPOFOL N/A 09/16/2018   Procedure: COLONOSCOPY WITH PROPOFOL;  Surgeon: Wilford Corner, MD;  Location: [facility];  Service: Endoscopy;  Laterality: N/A;  . FECAL TRANSPLANT N/A 09/16/2018   Procedure: FECAL TRANSPLANT;  Surgeon: Wilford Corner, MD;  Location: [facility];  Service: Endoscopy;  Laterality: N/A;  . IR CATHETER TUBE CHANGE  04/14/2018  . IR CHOLANGIOGRAM EXISTING TUBE  03/17/2018  . IR PERC CHOLECYSTOSTOMY  01/31/2018  . IR RADIOLOGIST EVAL & MGMT  03/02/2018    Assessment & Plan Clinical Impression:  [name] do is a 72 year old right hand male with history of diastolic congestive heart failure, hypertension, recurrent Clostridium  difficile infection, type 2 diabetes mellitus. History taken from chart review and family. Per chart review patient lives with spouse. One level home 5 steps to entry. He was able to perform household ambulation with a rolling walker. Wife does assist with some basic ADLs. Wife does receive hemodialysis 3 days a week. 2 sons in the  area work. Presented 11/04/2018 with ischemic ulceration right heel as well as altered mental status. Leukocytosis 16,500 and low-grade fever. Cranial CT scan reviewed, unremarkable for acute intracranial process.  X-rays of right foot showed no evidence of osteomyelitis. Blood cultures Proteus Mirabalis and maintain on anabolic therapy. 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 Servando Snare. Hospital course pain management. Acute blood loss anemia 7.2 with plan for transfusion, leukocytosis improving 13,600. Hypokalemia 2.7 with supplement added with follow-up labs. Antibiotic therapy completed 11/11/2018. Stump sock placed per vascular surgery/[employer]. Subcutaneous heparin for DVT prophylaxis.Therapy evaluations completed with recommendations of physical medicine rehabilitation consult. Patient was admitted for a comp relative rehabilitation program.  Discussed with insurance physician. Patient transferred to CIR on 11/15/2018 .   Patient currently requires total with mobility secondary to muscle weakness, decreased coordination and decreased motor planning, decreased initiation, decreased attention, decreased awareness, decreased problem solving, decreased safety awareness, decreased memory and delayed processing and decreased sitting balance, decreased postural control and decreased balance strategies.  Prior to hospitalization, patient was modified independent  with mobility and lived with Spouse in a House home.  Home access is 5Stairs to enter, Ramped entrance(Pt reports family building ramp).  Patient will benefit from skilled PT intervention to maximize safe functional mobility, minimize fall risk and decrease caregiver burden for planned discharge home with 24 hour assist.  Anticipate patient will benefit from follow up [facility] at discharge.  PT - End of Session Activity Tolerance: Tolerates 30+ min activity with multiple rests Endurance Deficit: Yes PT  Assessment Rehab Potential (ACUTE/IP ONLY): Fair PT Barriers to Discharge: Medical stability;Home environment access/layout PT Patient demonstrates impairments in the following area(s): Balance;Endurance;Motor;Safety PT Transfers Functional Problem(s): Bed Mobility;Bed to Chair;Car;Furniture;Floor PT Locomotion Functional Problem(s): Ambulation;Wheelchair Mobility;Stairs PT Plan PT Intensity: Minimum of 1-2 x/day ,45 to 90 minutes PT Frequency: 5 out of 7 days PT Duration Estimated Length of Stay: 21-24 days PT Treatment/Interventions: Ambulation/gait training;Balance/vestibular training;Cognitive remediation/compensation;Community reintegration;Discharge planning;Disease management/prevention;DME/adaptive equipment instruction;Functional mobility training;Pain management;Neuromuscular re-education;Patient/family education;Psychosocial support;Splinting/orthotics;Therapeutic Activities;Therapeutic Exercise;UE/LE Strength taining/ROM;UE/LE Coordination activities;Wheelchair propulsion/positioning PT Transfers Anticipated Outcome(s): Min A PT Locomotion Anticipated Outcome(s): Supervision at w/c level PT Recommendation Recommendations for Other Services: Speech consult;Neuropsych consult Follow Up Recommendations: Home health PT Patient destination: Home Equipment Recommended: Sliding board;Wheelchair (measurements);Wheelchair cushion (measurements);To be determined Equipment Details: TBD pending progress  Skilled Therapeutic Intervention Evaluation completed (see details above and below) with education on PT POC and goals and individual treatment initiated with focus on functional transfer and w/c mobility assessment. Pt received seated in w/c in posterior pelvic tilt sliding forwards out of w/c. Pt unable to follow cues to push through BUE to scoot back in chair, max A to scoot back. Sliding board transfer w/c to/from bed with total A x 2. Pt unable to follow cues to assist with transfer. Sit  to/from supine with max A. Rolling L/R with max A in bed to pull up pants dependently. Set pt up with new w/c cushion for decreased sliding in w/c. Manual w/c propulsion x 50 ft with use of BUE before onset of fatigue, significantly increased time needed as well as cueing  to prevent wheelchair turning right towards the wall. Pt left seated in w/c in room with needs in reach, quick release belt and chair alarm in place.  PT Evaluation Precautions/Restrictions Precautions Precautions: Fall Restrictions Weight Bearing Restrictions: Yes RLE Weight Bearing: Non weight bearing Home Living/Prior Functioning Home Living Living Arrangements: Spouse/significant other Available Help at Discharge: Family;Available 24 hours/day Type of Home: House Home Access: Stairs to enter;Ramped entrance(Pt reports family building ramp) Entrance Stairs-Number of Steps: 5 Entrance Stairs-Rails: Right;Left Home Layout: One level Bathroom Shower/Tub: Walk-in shower Additional Comments: Pt has been sponge bathing since Feb. Pt reports house is w/c accessibile. Cont to ask set-up when family present  Lives With: Spouse Prior Function Level of Independence: Independent with gait;Independent with transfers;Requires assistive device for independence;Needs assistance with ADLs  Able to Take Stairs?: Yes Driving: No Vocation: Retired Comments: uses RW for all mobility and wife helps as needed Vision/Perception  Vision - History Baseline Vision: Wears glasses only for reading Patient Visual Report: No change from baseline Perception Perception: Within Functional Limits Praxis Praxis: Impaired Praxis Impairment Details: Motor planning  Cognition Overall Cognitive Status: Impaired/Different from baseline Arousal/Alertness: Awake/alert Orientation Level: Oriented X4 Attention: Focused Focused Attention: Impaired Memory: Impaired Memory Impairment: Retrieval deficit;Decreased recall of new information;Decreased  short term memory Awareness: Impaired Awareness Impairment: Emergent impairment;Anticipatory impairment Problem Solving: Impaired Safety/Judgment: Impaired Comments: Decreased safety awareness with decreased awareness of deficits and functional implications.  Sensation Sensation Light Touch: Appears Intact Proprioception: Impaired Detail Proprioception Impaired Details: Impaired LLE Coordination Gross Motor Movements are Fluid and Coordinated: No Coordination and Movement Description: Impaired due to new AKA and generalized weakness/deconditioning Heel Shin Test: unable to perform B Motor  Motor Motor: Other (comment) Motor - Skilled Clinical Observations: Impaired due to Generalized weakness  Mobility Bed Mobility Bed Mobility: Rolling Right;Rolling Left;Supine to Sit;Sit to Supine Rolling Right: Maximal Assistance - Patient 25-49% Rolling Left: Maximal Assistance - Patient 25-49% Supine to Sit: Maximal Assistance - Patient - Patient 25-49% Sit to Supine: Maximal Assistance - Patient 25-49% Transfers Transfers: Lateral/Scoot Transfers Lateral/Scoot Transfers: 2 Press photographer (Assistive device): Other (Comment)(slide board) Artist / Additional Locomotion Stairs: No Architect: Yes Wheelchair Assistance: Minimal assistance - Patient >75% Environmental health practitioner: Both upper extremities Wheelchair Parts Management: Needs assistance Distance: 50  Trunk/Postural Assessment  Cervical Assessment Cervical Assessment: Exceptions to WFL(Forward head) Thoracic Assessment Thoracic Assessment: Exceptions to WFL(Rounded shoulders) Lumbar Assessment Lumbar Assessment: Exceptions to WFL(Posterior pelvic tilt) Postural Control Postural Control: Deficits on evaluation Righting Reactions: delayed  Balance Balance Balance Assessed: Yes Static Sitting Balance Static Sitting - Balance Support: Feet supported;Right upper extremity supported;Left  upper extremity supported Static Sitting - Level of Assistance: 4: Min assist;5: Stand by assistance Static Sitting - Comment/# of Minutes: Sitting EOB Dynamic Sitting Balance Dynamic Sitting - Balance Support: Bilateral upper extremity supported;During functional activity Dynamic Sitting - Level of Assistance: 4: Min assist Sitting balance - Comments: Sitting EOB Static Standing Balance Static Standing - Balance Support: Bilateral upper extremity supported Static Standing - Level of Assistance: 1: +2 Total assist Static Standing - Comment/# of Minutes: Attempted to stand in STEDY. Unable to achieve erect posture and clear buttock for pads to be palced despite +2 total A Dynamic Standing Balance Dynamic Standing - Level of Assistance: Not tested (comment) Dynamic Standing - Comments: Unable to acheive standing posture Extremity Assessment   RLE Assessment RLE Assessment: Exceptions to [facility] Passive Range of Motion (PROM) Comments: R AKA General Strength Comments: 4/5 hip  RLE Strength RLE Overall Strength: Within Functional Limits for tasks assessed LLE Assessment LLE Assessment: Exceptions to [facility] Passive Range of Motion (PROM) Comments: tight HS General Strength Comments: impaired LLE Strength Left Hip Flexion: 3-/5 Left Knee Flexion: 4-/5 Left Knee Extension: 3-/5 Left Ankle Dorsiflexion: 3/5    Refer to Care Plan for Long Term Goals  Recommendations for other services: Other: Speech Therapy Eval  Discharge Criteria: Patient will be discharged from PT if patient refuses treatment 3 consecutive times without medical reason, if treatment goals not met, if there is a change in medical status, if patient makes no progress towards goals or if patient is discharged from hospital.  The above assessment, treatment plan, treatment alternatives and goals were discussed and mutually agreed upon: by patient  Excell Seltzer, PT, DPT  11/16/2018, 12:33 PM

## 2018-11-16 NOTE — Progress Notes (Addendum)
[place] PHYSICAL MEDICINE & REHABILITATION PROGRESS NOTE  Subjective/Complaints: Patient seen working with therapy this morning.  He states he slept well overnight.  ROS: Denies CP, shortness of breath, nausea, vomiting, diarrhea.  Objective: Vital Signs: Blood pressure (!) 158/61, pulse 67, temperature 98.6 F (37 C), resp. rate 18, height 5\' 9"  (1.753 m), weight 73.6 kg, SpO2 98 %. No results found. Recent Labs    11/15/18 1843  WBC 15.9*  HGB 8.2*  HCT 24.9*  PLT 419*   Recent Labs    11/15/18 0937 11/15/18 1500 11/15/18 1843  NA 136  --   --   K 2.7* 3.6  --   CL 102  --   --   CO2 26  --   --   GLUCOSE 150*  --   --   BUN <5*  --   --   CREATININE 0.86  --  0.86  CALCIUM 8.1*  --   --     Physical Exam: BP (!) 158/61 (BP Location: Left Arm)   Pulse 67   Temp 98.6 F (37 C)   Resp 18   Ht 5\' 9"  (1.753 m)   Wt 73.6 kg   SpO2 98%   BMI 23.96 kg/m  Constitutional: No distress . Vital signs reviewed. HENT: Normocephalic.  Atraumatic. Eyes: EOMI. No discharge. Cardiovascular: RRR. No JVD. Respiratory: CTA Bilaterally. Normal effort. GI: BS +. Non-distended. Musc: Right AKA Neurological: He is alert.  Some delay in processing.  Follows commands. Motor: Bilateral upper extremities: 4/5 proximal distal Right lower extremity: Hip flexion 4/5, unchanged Left lower extremity: Hip flexion, knee extension 3-/5, ankle dorsiflexion 3/5, unchanged  Skin: Right AKA with staples C/D/I Psychiatric: His affect is blunt. His speech is delayed. He is slowed.   Assessment/Plan: 1. 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  Physiatrist is providing close team supervision and 24 hour management of active medical problems listed below.  Physiatrist and rehab team continue to assess barriers to discharge/monitor patient progress toward functional and medical  goals  Care Tool:  Bathing              Bathing assist       Upper Body Dressing/Undressing Upper body dressing        Upper body assist      Lower Body Dressing/Undressing Lower body dressing            Lower body assist       Toileting Toileting    Toileting assist       Transfers Chair/bed transfer  Transfers assist           Locomotion Ambulation   Ambulation assist              Walk 10 feet activity   Assist           Walk 50 feet activity   Assist           Walk 150 feet activity   Assist           Walk 10 feet on uneven surface  activity   Assist           Wheelchair     Assist               Wheelchair 50 feet with 2 turns activity    Assist            Wheelchair  150 feet activity     Assist            Medical Problem List and Plan: 1.  Decreased functional mobility secondary to right AKA 11/09/2018  Begin CIR  Stump shrinker ordered 2.  DVT Prophylaxis/Anticoagulation: Subcutaneous heparin. Monitor for any bleeding episodes 3. Pain Management:  Percocet as needed 4. Mood:  Provide emotional support 5. Neuropsych: This patient is not fully capable of making decisions on his own behalf. 6. Skin/Wound Care:  Routine skin checks 7. Fluids/Electrolytes/Nutrition/hypokalemia:  Routine ins and outs   CMP pending for this morning 8. Acute blood loss anemia. Continue iron supplement  Hemoglobin 8.2 on 12/23  Continue to monitor 9. Hypertension.Norvasc 10 mg daily, Cozaar 100 mg daily, Toprol-XL 50 mg daily.   Monitor with increased mobility 10. Diabetes mellitus with peripheral neuropathy. Hemoglobin A1c 6.7.SSI. Patient on Amaryl 4 mg twice a day prior to admission. Resume as needed  Monitor with increased mobility 11. Hyperlipidemia. Zocor  LOS: 1 days A FACE TO FACE EVALUATION WAS PERFORMED  Ankit Karis Jubanil Patel 11/16/2018, 8:32 AM

## 2018-11-16 NOTE — Patient Care Conference (Signed)
Inpatient RehabilitationTeam Conference and Plan of Care Update Date: 11/16/2018   Time: 9:40 Am    Patient Name: Jeremiah [name]      Medical Record Number: [id number]  Date of Birth: [birth year] Sex: Male         Room/Bed: 4M11C/4M11C-01 Payor Info: Payor: [employer] MEDICARE / Plan: [employer] MEDICARE / Product Type: *No Product type* /    Admitting Diagnosis: R AKA  Admit Date/Time:  11/15/2018  6:04 PM Admission Comments: No comment available   Primary Diagnosis:  <principal problem not specified> Principal Problem: <principal problem not specified>  Patient Active Problem List   Diagnosis Date Noted  . 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 Postoperative pain   . Acute blood loss anemia   . Dyslipidemia   . Type 2 diabetes mellitus with peripheral neuropathy (HCC)   . S/P AKA (above knee amputation) (HCC) 11/12/2018  . Unilateral AKA, right (HCC)   . Hypertensive crisis   . Diabetes mellitus type 2 in nonobese (HCC)   . Pleural effusion 11/08/2018  . Fever   . Open wound of right foot   . Sepsis (HCC) 11/03/2018  . Altered mental status   . Acute cystitis without hematuria   . Leukocytosis   . Pressure injury of skin 07/08/2018  . Clostridium difficile colitis 07/07/2018  . Hypokalemia 07/07/2018  . HLD (hyperlipidemia) 07/07/2018  . Acute diverticulitis 06/04/2018  . Cholecystitis 05/24/2018  . Cholecystitis with cholelithiasis 05/21/2018  . Acute systolic heart failure (HCC) 03/27/2018  . Diabetes mellitus without complication (HCC) 01/30/2018  . Hypertension 01/30/2018  . Acute cholecystitis 01/30/2018  . AKI (acute kidney injury) (HCC) 01/30/2018  . Normocytic anemia 01/30/2018  . PVD (peripheral vascular disease) (HCC) 09/05/2014    Expected Discharge Date:    Team Members Present: Physician leading conference: Dr. Maryla MorrowAnkit Patel Social Worker Present: Dossie DerBecky Naleah Kofoed, LCSW Nurse Present: Chana Bodeeborah Sharp, RN PT Present: Maren ReamerElizabeth Seigle, PT OT  Present: Amy Rounds, OT SLP Present: Jackalyn LombardNicole Page, SLP PPS Coordinator present : Tora DuckMarie Noel, RN, CRRN     Current Status/Progress Goal Weekly Team Focus  Medical   Decreased functional mobility secondary to right AKA 11/09/2018  Improve mobility, HTN/DM  See above   Bowel/Bladder   Patient is incontient of bladder at times, continent of bowel LBM-11/14/18  Regain regular urinary and bowel pattern  Assist with toileting needs as needed.    Swallow/Nutrition/ Hydration             ADL's     new eval        Mobility     new eval        Communication             Safety/Cognition/ Behavioral Observations            Pain   Patient c/o phantom pain to right leg, given tylenol prn   maintain pain level below 3/10.   assess and address pain every shift and prn   Skin   Surgical incision to Right leg, S/P R AKA, Stage II pressure ulcer to sacrum-foam dressing intact  No s/sx of infection, no further skin impairment  Assess skin every shift and prn      *See Care Plan and progress notes for long and short-term goals.     Barriers to Discharge  Current Status/Progress Possible Resolutions Date Resolved   Physician    Medical stability;Wound Care;Weight bearing restrictions     See above  Therapies, optimize DM/HTN  meds      Nursing                  PT                    OT                  SLP                SW Decreased caregiver support Wife is limited in her care she can provide            Discharge Planning/Teaching Needs:    HOme with wife who is limited in her assistance will await therapy evaluations     Team Discussion:  New patient evaluations pending-MD managing medical issues and watching labs  Revisions to Treatment Plan:  New eval    Continued Need for Acute Rehabilitation Level of Care: The patient requires daily medical management by a physician with specialized training in physical medicine and rehabilitation for the following conditions: Daily  direction of a multidisciplinary physical rehabilitation program to ensure safe treatment while eliciting the highest outcome that is of practical value to the patient.: Yes Daily medical management of patient stability for increased activity during participation in an intensive rehabilitation regime.: Yes Daily analysis of laboratory values and/or radiology reports with any subsequent need for medication adjustment of medical intervention for : Post surgical problems;Diabetes problems;Wound care problems;Blood pressure problems   I attest that I was present, lead the team conference, and concur with the assessment and plan of the team.   Lucy Chrisupree, Yunuen Mordan G 11/16/2018, 10:18 AM

## 2018-11-17 DIAGNOSIS — D72829 Elevated white blood cell count, unspecified: Secondary | ICD-10-CM

## 2018-11-17 DIAGNOSIS — E46 Unspecified protein-calorie malnutrition: Secondary | ICD-10-CM

## 2018-11-17 DIAGNOSIS — R0989 Other specified symptoms and signs involving the circulatory and respiratory systems: Secondary | ICD-10-CM

## 2018-11-17 DIAGNOSIS — E8809 Other disorders of plasma-protein metabolism, not elsewhere classified: Secondary | ICD-10-CM

## 2018-11-17 LAB — GLUCOSE, CAPILLARY
Glucose-Capillary: 117 mg/dL — ABNORMAL HIGH (ref 70–99)
Glucose-Capillary: 153 mg/dL — ABNORMAL HIGH (ref 70–99)
Glucose-Capillary: 131 mg/dL — ABNORMAL HIGH (ref 70–99)
Glucose-Capillary: 128 mg/dL — ABNORMAL HIGH (ref 70–99)

## 2018-11-17 MED ORDER — PRO-STAT SUGAR FREE PO LIQD
30.0000 mL | Freq: Two times a day (BID) | ORAL | Status: DC
  Administered 2018-11-17 – 2018-12-06 (×37): 30 mL via ORAL
  Filled 2018-11-17 (×38): qty 30

## 2018-11-17 NOTE — Progress Notes (Signed)
[facility] PHYSICAL MEDICINE & REHABILITATION PROGRESS NOTE  Subjective/Complaints: Patient seen laying in bed.  Family at bedside.  He states he is enjoying the rest.  Family ready to restart therapies tomorrow.  ROS: Denies CP, shortness of breath, nausea, vomiting, diarrhea.  Objective: Vital Signs: Blood pressure (!) 134/59, pulse (!) 59, temperature (!) 97.5 F (36.4 C), resp. rate 14, height 5\' 9"  (1.753 m), weight 73.6 kg, SpO2 99 %. No results found. Recent Labs    11/15/18 1843 11/16/18 0742  WBC 15.9* 12.8*  HGB 8.2* 8.5*  HCT 24.9* 26.2*  PLT 419* 453*   Recent Labs    11/15/18 0937 11/15/18 1500 11/15/18 1843 11/16/18 0742  NA 136  --   --  136  K 2.7* 3.6  --  3.9  CL 102  --   --  100  CO2 26  --   --  22  GLUCOSE 150*  --   --  119*  BUN <5*  --   --  5*  CREATININE 0.86  --  0.86 0.85  CALCIUM 8.1*  --   --  8.2*    Physical Exam: BP (!) 134/59 (BP Location: Right Arm)   Pulse (!) 59   Temp (!) 97.5 F (36.4 C)   Resp 14   Ht 5\' 9"  (1.753 m)   Wt 73.6 kg   SpO2 99%   BMI 23.96 kg/m  Constitutional: No distress . Vital signs reviewed. HENT: Normocephalic.  Atraumatic. Eyes: EOMI. No discharge. Cardiovascular: RRR.  No JVD. Respiratory: CTA bilaterally.  Normal effort. GI: BS +. Non-distended. Musc: Right AKA Neurological: He is alert.  Some delay in processing, stable.  Follows commands. Motor: Bilateral upper extremities: 4/5 proximal distal, stable Right lower extremity: Hip flexion 4/5, unchanged Left lower extremity: Hip flexion, knee extension 3-/5, ankle dorsiflexion 3/5, unchanged  Skin: Right AKA with dressing C/D/I Psychiatric: His affect is blunt. His speech is delayed. He is slowed.   Assessment/Plan: 1. 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  Physiatrist is providing close team supervision and 24 hour management of  active medical problems listed below.  Physiatrist and rehab team continue to assess barriers to discharge/monitor patient progress toward functional and medical goals  Care Tool:  Bathing  Bathing activity did not occur: Refused           Bathing assist       Upper Body Dressing/Undressing Upper body dressing   What is the patient wearing?: Hospital gown only    Upper body assist Assist Level: Moderate Assistance - Patient 50 - 74%    Lower Body Dressing/Undressing Lower body dressing      What is the patient wearing?: Pants, Ace wrap/stump shrinker     Lower body assist Assist for lower body dressing: Total Assistance - Patient < 25%     Toileting Toileting    Toileting assist Assist for toileting: Moderate Assistance - Patient 50 - 74%     Transfers Chair/bed transfer  Transfers assist  Chair/bed transfer activity did not occur: Refused  Chair/bed transfer assist level: Total Assistance - Patient < 25%     Locomotion Ambulation   Ambulation assist   Ambulation activity did not occur: Safety/medical concerns          Walk 10 feet activity   Assist  Walk 10 feet activity did not occur: Safety/medical concerns  Walk 50 feet activity   Assist Walk 50 feet with 2 turns activity did not occur: Safety/medical concerns         Walk 150 feet activity   Assist Walk 150 feet activity did not occur: Safety/medical concerns         Walk 10 feet on uneven surface  activity   Assist Walk 10 feet on uneven surfaces activity did not occur: Safety/medical concerns         Wheelchair     Assist Will patient use wheelchair at discharge?: Yes Type of Wheelchair: Manual    Wheelchair assist level: Contact Guard/Touching assist Max wheelchair distance: 100    Wheelchair 50 feet with 2 turns activity    Assist        Assist Level: Contact Guard/Touching assist   Wheelchair 150 feet activity     Assist  Wheelchair 150 feet activity did not occur: Safety/medical concerns          Medical Problem List and Plan: 1.  Decreased functional mobility secondary to right AKA 11/09/2018  Continue CIR tomorrow  Stump shrinker pending 2.  DVT Prophylaxis/Anticoagulation: Subcutaneous heparin. Monitor for any bleeding episodes 3. Pain Management:  Percocet as needed 4. Mood:  Provide emotional support 5. Neuropsych: This patient is not fully capable of making decisions on his own behalf. 6. Skin/Wound Care:  Routine skin checks 7. Fluids/Electrolytes/Nutrition/hypokalemia:  Routine ins and outs   BMP within acceptable range on 12/24 8. Acute blood loss anemia. Continue iron supplement  Hemoglobin 8.5 on 12/24  Continue to monitor 9. Hypertension.Norvasc 10 mg daily, Cozaar 100 mg daily, Toprol-XL 50 mg daily.   Labile on 12/25 10. Diabetes mellitus with peripheral neuropathy. Hemoglobin A1c 6.7.SSI. Patient on Amaryl 4 mg twice a day prior to admission. Resume as needed  Relatively controlled on 12/25 11. Hyperlipidemia. Zocor 12.  Hypoalbuminemia  Supplement initiated on 12/25 13.  Leukocytosis  Afebrile  WBCs 12.8 on 12/24  Continue to monitor  LOS: 2 days A FACE TO FACE EVALUATION WAS PERFORMED  Laelynn Blizzard Karis Jubanil Braxten Memmer 11/17/2018, 6:15 PM

## 2018-11-18 ENCOUNTER — Inpatient Hospital Stay (HOSPITAL_COMMUNITY): Payer: Medicare Other | Admitting: Physical Therapy

## 2018-11-18 ENCOUNTER — Inpatient Hospital Stay (HOSPITAL_COMMUNITY): Payer: Medicare Other | Admitting: Occupational Therapy

## 2018-11-18 DIAGNOSIS — R197 Diarrhea, unspecified: Secondary | ICD-10-CM

## 2018-11-18 DIAGNOSIS — I10 Essential (primary) hypertension: Secondary | ICD-10-CM

## 2018-11-18 LAB — GLUCOSE, CAPILLARY
Glucose-Capillary: 121 mg/dL — ABNORMAL HIGH (ref 70–99)
Glucose-Capillary: 122 mg/dL — ABNORMAL HIGH (ref 70–99)
Glucose-Capillary: 100 mg/dL — ABNORMAL HIGH (ref 70–99)
Glucose-Capillary: 117 mg/dL — ABNORMAL HIGH (ref 70–99)

## 2018-11-18 MED ORDER — CALCIUM POLYCARBOPHIL 625 MG PO TABS
625.0000 mg | ORAL_TABLET | Freq: Every day | ORAL | Status: DC
  Administered 2018-11-18 – 2018-11-20 (×3): 625 mg via ORAL
  Filled 2018-11-18 (×3): qty 1

## 2018-11-18 NOTE — Progress Notes (Signed)
[place] PHYSICAL MEDICINE & REHABILITATION PROGRESS NOTE  Subjective/Complaints: Patient seen sitting up in his chair this AM, about to work with therapies.  He states he slept wlel overnight.  He notes diarrhea.   ROS: +Diarrhea. Denies CP, shortness of breath, nausea, vomiting.  Objective: Vital Signs: Blood pressure (!) 151/61, pulse 67, temperature 98.2 F (36.8 C), temperature source Oral, resp. rate 18, height 5\' 9"  (1.753 m), weight 73.6 kg, SpO2 98 %. No results found. Recent Labs    11/15/18 1843 11/16/18 0742  WBC 15.9* 12.8*  HGB 8.2* 8.5*  HCT 24.9* 26.2*  PLT 419* 453*   Recent Labs    11/15/18 1500 11/15/18 1843 11/16/18 0742  NA  --   --  136  K 3.6  --  3.9  CL  --   --  100  CO2  --   --  22  GLUCOSE  --   --  119*  BUN  --   --  5*  CREATININE  --  0.86 0.85  CALCIUM  --   --  8.2*    Physical Exam: BP (!) 151/61 (BP Location: Right Arm)   Pulse 67   Temp 98.2 F (36.8 C) (Oral)   Resp 18   Ht 5\' 9"  (1.753 m)   Wt 73.6 kg   SpO2 98%   BMI 23.96 kg/m  Constitutional: No distress . Vital signs reviewed. HENT: Normocephalic.  Atraumatic. Eyes: EOMI. No discharge. Cardiovascular: RRR. No JVD. Respiratory: CTA bilaterally. Normal effort. GI: BS +. Non-distended. Musc: Right AKA Neurological: He is alert.  Some delay in processing, unchanged.  Follows commands. Motor: Bilateral upper extremities: 4/5 proximal distal, unchanged Right lower extremity: Hip flexion 4+/5 Left lower extremity: Hip flexion 3/5, knee extension 4-/5, ankle dorsiflexion 4/5, unchanged  Skin: Right AKA with dressing C/D/I Psychiatric: His affect is blunt. His speech is delayed. He is slowed.   Assessment/Plan: 1. 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  Physiatrist is providing close team supervision and 24 hour management of active medical problems listed  below.  Physiatrist and rehab team continue to assess barriers to discharge/monitor patient progress toward functional and medical goals  Care Tool:  Bathing  Bathing activity did not occur: Refused           Bathing assist       Upper Body Dressing/Undressing Upper body dressing   What is the patient wearing?: Hospital gown only    Upper body assist Assist Level: Moderate Assistance - Patient 50 - 74%    Lower Body Dressing/Undressing Lower body dressing      What is the patient wearing?: Incontinence brief     Lower body assist Assist for lower body dressing: Total Assistance - Patient < 25%     Toileting Toileting    Toileting assist Assist for toileting: Moderate Assistance - Patient 50 - 74%     Transfers Chair/bed transfer  Transfers assist  Chair/bed transfer activity did not occur: N/A  Chair/bed transfer assist level: Maximal Assistance - Patient 25 - 49%     Locomotion Ambulation   Ambulation assist   Ambulation activity did not occur: Safety/medical concerns          Walk 10 feet activity   Assist  Walk 10 feet activity did not occur: Safety/medical concerns        Walk 50 feet activity   Assist Walk 50  feet with 2 turns activity did not occur: Safety/medical concerns         Walk 150 feet activity   Assist Walk 150 feet activity did not occur: Safety/medical concerns         Walk 10 feet on uneven surface  activity   Assist Walk 10 feet on uneven surfaces activity did not occur: Safety/medical concerns         Wheelchair     Assist Will patient use wheelchair at discharge?: Yes Type of Wheelchair: Manual    Wheelchair assist level: Supervision/Verbal cueing Max wheelchair distance: 150    Wheelchair 50 feet with 2 turns activity    Assist        Assist Level: Supervision/Verbal cueing   Wheelchair 150 feet activity     Assist Wheelchair 150 feet activity did not occur:  Safety/medical concerns   Assist Level: Supervision/Verbal cueing      Medical Problem List and Plan: 1.  Decreased functional mobility secondary to right AKA 11/09/2018  Continue CIR   Stump shrinker 2.  DVT Prophylaxis/Anticoagulation: Subcutaneous heparin. Monitor for any bleeding episodes 3. Pain Management:  Percocet as needed 4. Mood:  Provide emotional support 5. Neuropsych: This patient is not fully capable of making decisions on his own behalf. 6. Skin/Wound Care:  Routine skin checks 7. Fluids/Electrolytes/Nutrition/hypokalemia:  Routine ins and outs   BMP within acceptable range on 12/24 8. Acute blood loss anemia. Continue iron supplement  Hemoglobin 8.5 on 12/24  Continue to monitor 9. Hypertension.Norvasc 10 mg daily, Cozaar 100 mg daily, Toprol-XL 50 mg daily.   Slightly labile on 12/26  Will consider increase in medication tomorrow 10. Diabetes mellitus with peripheral neuropathy. Hemoglobin A1c 6.7.SSI. Patient on Amaryl 4 mg twice a day prior to admission. Resume as needed  Elevated on 12/26 11. Hyperlipidemia. Zocor 12.  Hypoalbuminemia  Supplement initiated on 12/25 13.  Leukocytosis  Afebrile  WBCs 12.8 on 12/24  Continue to monitor 14. Diarrhea  Fiber added on 12/26  LOS: 3 days A FACE TO FACE EVALUATION WAS PERFORMED  Emberli Ballester Karis Jubanil Christain Niznik 11/18/2018, 10:57 AM

## 2018-11-18 NOTE — Progress Notes (Signed)
Kirsteins, Victorino Sparrow, MD  Physician  Physical Medicine and Rehabilitation  Consult Note  Signed  Date of Service:  11/10/2018 5:43 AM       Related encounter: ED to Hosp-Admission (Discharged) from 11/03/2018 in [facility] 3E CHF      Signed      Expand All Collapse All    Show:Clear all [x] Manual[x] Template[] Copied  Added by: [x] Angiulli, Mcarthur Rossetti, PA-C[x] Kirsteins, Victorino Sparrow, MD  [] Hover for details      Physical Medicine and Rehabilitation Consult Reason for Consult:  Decreased functional mobility Referring Physician: [facility]   HPI: Jeremiah [name] is a 72 y.o.right handed male  with history of diastolic congestive heart failure, hypertension, recurrent C. Difficile infection, type 2 diabetes mellitus. Per chart review patient lives with spouse. One level home with 5 steps to entry. He was able to perform household ambulation with a rolling walker. Wife does assist with some basic ADLs. Wife does receive hemodialysis 3 days a week. 2 sons in the area work. Presented 11/04/2018 with ischemic ulceration right heel/altered mental status as well as noted leukocytosis 16,500 and low-grade fever. Cranial CT scan negative. X-rays of right foot showed no evidence of osteomyelitis.Blood cultures Proteus Mirabilis and maintain on antibiotic therapy. 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 Lemar Livings. Hospital course pain management. Acute blood loss anemia 9.7, leukocytosis 20,500. Plans to continue antibiotic therapy through 11/11/2018. Therapy evaluations pending. M.D. has requested physical medicine rehabilitation consult.  Discussed neuropathy with patient and wife  Review of Systems  Constitutional: Positive for fever. Negative for chills.  Eyes: Negative for blurred vision and double vision.  Respiratory: Negative for cough and shortness of breath.   Cardiovascular: Positive for leg swelling. Negative for chest  pain and palpitations.  Gastrointestinal: Positive for constipation. Negative for nausea and vomiting.  Genitourinary: Positive for urgency. Negative for dysuria, flank pain and hematuria.  Musculoskeletal: Positive for joint pain and myalgias.  Skin: Negative for rash.  Neurological: Positive for weakness.  All other systems reviewed and are negative.      Past Medical History:  Diagnosis Date  . Arthritis    "joints; shoulders, knees, hands, back" (05/21/2018)  . C. difficile diarrhea 04/2018  . Diastolic CHF (HCC)   . High cholesterol   . History of gout   . Hypertension   . Peripheral vascular disease (HCC)   . Pneumonia    "couple times" (05/21/2018)  . Sleep apnea    "has mask; won't use" (05/21/2018)  . Type II diabetes mellitus (HCC)         Past Surgical History:  Procedure Laterality Date  . ABDOMINAL AORTOGRAM W/LOWER EXTREMITY N/A 11/01/2018   Procedure: ABDOMINAL AORTOGRAM W/LOWER EXTREMITY;  Surgeon: Runell Gess, MD;  Location: [facility] INVASIVE CV LAB;  Service: Cardiovascular;  Laterality: N/A;  . CATARACT EXTRACTION W/ INTRAOCULAR LENS  IMPLANT, BILATERAL Bilateral   . CHOLECYSTECTOMY  05/21/2018   ATTEMPTED LAPAROSCOPIC CHOLECYSTECTOMY, OPEN DRAINAGE OF GALLBLADDER WITH BIOPSY  . CHOLECYSTECTOMY N/A 05/21/2018   Procedure: ATTEMPTED LAPAROSCOPIC CHOLECYSTECTOMY, OPEN DRAINAGE OF GALLBLADDER WITH BIOPSY;  Surgeon: Griselda Miner, MD;  Location: [facility] OR;  Service: General;  Laterality: N/A;  . COLONOSCOPY W/ POLYPECTOMY    . COLONOSCOPY WITH PROPOFOL N/A 09/16/2018   Procedure: COLONOSCOPY WITH PROPOFOL;  Surgeon: Charlott Rakes, MD;  Location: [facility] ENDOSCOPY;  Service: Endoscopy;  Laterality: N/A;  . FECAL TRANSPLANT N/A 09/16/2018   Procedure: FECAL TRANSPLANT;  Surgeon:  Charlott RakesSchooler, Vincent, MD;  [facility];  Service: Endoscopy;  Laterality: N/A;  . IR CATHETER TUBE CHANGE  04/14/2018  . IR CHOLANGIOGRAM EXISTING TUBE  03/17/2018    . IR PERC CHOLECYSTOSTOMY  01/31/2018  . IR RADIOLOGIST EVAL & MGMT  03/02/2018        Family History  Problem Relation Age of Onset  . Diabetes Mother   . Hypertension Mother   . Heart disease Father   . Heart attack Father   . Diabetes Sister   . Hypertension Sister   . Diabetes Brother   . Heart disease Brother   . Hypertension Brother   . Heart attack Brother    Social History:  reports that he quit smoking about 34 years ago. His smoking use included cigarettes. He quit after 24.00 years of use. He has never used smokeless tobacco. He reports previous alcohol use. He reports that he does not use drugs. Allergies: No Known Allergies       Medications Prior to Admission  Medication Sig Dispense Refill  . acetaminophen (TYLENOL) 500 MG tablet Take 1,000 mg by mouth every 6 (six) hours as needed for moderate pain or headache.    Marland Kitchen. aspirin 81 MG tablet Take 81 mg by mouth daily.    . brimonidine (ALPHAGAN) 0.2 % ophthalmic solution Place 1 drop into both eyes 2 (two) times daily.     . cadexomer iodine (IODOSORB) 0.9 % gel Apply 1 application topically daily as needed for wound care. 40 g 2  . glimepiride (AMARYL) 4 MG tablet Take 4 mg by mouth 2 (two) times daily.    Marland Kitchen. losartan (COZAAR) 100 MG tablet Take 100 mg by mouth daily.  3  . metoprolol succinate (TOPROL-XL) 50 MG 24 hr tablet Take 1 tablet (50 mg total) by mouth daily. Take with or immediately following a meal. 30 tablet 3  . Multiple Vitamin (MULTIVITAMIN WITH MINERALS) TABS tablet Take 1 tablet by mouth daily.    . simvastatin (ZOCOR) 40 MG tablet Take 40 mg by mouth daily.    . timolol (TIMOPTIC) 0.5 % ophthalmic solution Place 1 drop into both eyes 2 (two) times daily.  6    Home: Home Living Family/patient expects to be discharged to:: Private residence Living Arrangements: Spouse/significant other Available Help at Discharge: Family, Available 24 hours/day Type of Home: House Home  Access: Stairs to enter Entergy CorporationEntrance Stairs-Number of Steps: 5 Entrance Stairs-Rails: Right, Left Home Layout: One level Bathroom Shower/Tub: Health visitorWalk-in shower Bathroom Toilet: Handicapped height Home Equipment: Environmental consultantWalker - 2 wheels, Shower seat, Medical laboratory scientific officerCane - single point, Bedside commode  Functional History: Prior Function Level of Independence: Needs assistance Gait / Transfers Assistance Needed: Able to perform household ambulation with RW ADL's / Homemaking Assistance Needed: Wife assists with sponge bathing at sink and other ADLs as needed (wife herself does HD MWF) Functional Status:  Mobility: Bed Mobility Overal bed mobility: Needs Assistance Bed Mobility: Supine to Sit Supine to sit: Mod assist, HOB elevated General bed mobility comments: ModA to assist trunk elevation and assist hips to EOB Transfers Overall transfer level: Needs assistance Equipment used: Rolling walker (2 wheeled) Transfers: Sit to/from Stand Sit to Stand: Mod assist, From elevated surface General transfer comment: Max encouragement to assist as much as possible, requiring modA for trunk elevation and to cue upright posture. Pt reaching to arm rest of recliner and starting to fall backwards, requiring max cues for safety and to keep hands on RW Ambulation/Gait Ambulation/Gait assistance: Mod assist  Gait Distance (Feet): 1 Feet Assistive device: Rolling walker (2 wheeled) Gait Pattern/deviations: Step-to pattern, Trunk flexed, Leaning posteriorly General Gait Details: Took pivotal steps from bed to recliner requiring modA to prevent posterior LOB; cues for sequencing and to prevent pt sitting prematurely. Pt easily distracted not following commands well Gait velocity: Decreased  ADL:  Cognition: Cognition Overall Cognitive Status: Impaired/Different from baseline Orientation Level: Oriented to person, Oriented to place, Disoriented to time, Oriented to situation Cognition Arousal/Alertness: Awake/alert Behavior  During Therapy: Flat affect Overall Cognitive Status: Impaired/Different from baseline Area of Impairment: Attention, Memory, Following commands, Safety/judgement, Awareness, Problem solving Current Attention Level: Selective Memory: Decreased short-term memory Following Commands: Follows one step commands inconsistently Safety/Judgement: Decreased awareness of safety, Decreased awareness of deficits Awareness: Intellectual Problem Solving: Slow processing, Decreased initiation, Difficulty sequencing, Requires verbal cues, Requires tactile cues General Comments: Pt had been visiting with family for a while and not told anybody he was sitting in bowel incontinence  Blood pressure 122/60, pulse 72, temperature (!) 97.5 F (36.4 C), temperature source Oral, resp. rate 20, height 5\' 9"  (1.753 m), weight 73.3 kg, SpO2 99 %. Physical Exam  Vitals reviewed. Constitutional: He is oriented to person, place, and time.  HENT:  Head: Normocephalic.  Eyes: EOM are normal. Right eye exhibits no discharge. Left eye exhibits no discharge.  Neck: Normal range of motion. Neck supple. No thyromegaly present.  Cardiovascular: Normal rate and regular rhythm.  Respiratory: Effort normal and breath sounds normal. No respiratory distress.  GI: Soft. Bowel sounds are normal. He exhibits no distension.  Neurological: He is alert and oriented to person, place, and time.  Skin:  Amputation site is dressed appropriately tender  Strength is 5/5 bilateral deltoid bicep tricep grip 4/5 left hip flexion knee extension ankle dorsiflexion plantarflexion Sensation is reduced below the ankle on the left side to light touch. 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 Does the need for close, 24 hr/day medical supervision in concert with the patient's rehab needs make it unreasonable for this patient to be  served in a less intensive setting? Yes 2. Co-Morbidities requiring supervision/potential complications: Diabetes with peripheral neuropathy 3. Due to bladder management, bowel management, safety, skin/wound care, disease management, medication administration, pain management and patient education, does the patient require 24 hr/day rehab nursing? Yes 4. Does the patient require coordinated care of a physician, rehab nurse, PT (1-2 hrs/day, 5 days/week) and OT (1-2 hrs/day, 5 days/week) to address physical and functional deficits in the context of the above medical diagnosis(es)? Yes Addressing deficits in the following areas: balance, endurance, locomotion, strength, transferring, bowel/bladder control, bathing, dressing, feeding, grooming, toileting and psychosocial support 5. Can the patient actively participate in an intensive therapy program of at least 3 hrs of therapy per day at least 5 days per week? Yes 6. The potential for patient to make measurable gains while on inpatient rehab is excellent 7. Anticipated functional outcomes upon discharge from inpatient rehab are supervision  with PT, supervision and min assist with OT, n/a with SLP. 8. Estimated rehab length of stay to reach the above functional goals is: 10-16d 9. Anticipated D/C setting: Home 10. Anticipated post D/C treatments: HH therapy 11. Overall Rehab/Functional Prognosis: excellent  RECOMMENDATIONS: This patient's condition is appropriate for continued rehabilitative care in the following setting: CIR Patient has agreed to participate in recommended program. Yes Note that insurance  prior authorization may be required for reimbursement for recommended care.  Comment: should be ready in 1-2 d "I have personally performed a face to face diagnostic evaluation of this patient.  Additionally, I have reviewed and concur with the physician assistant's documentation above." Erick ColaceAndrew E. Kirsteins M.D. [facility] [id number] (Sports Med, Neuromuscular Med) Diplomate Am Board of Electrodiagnostic Med  Lynnae PrudeDaniel J Angiulli, PA-C 11/10/2018        Revision History                        Routing History

## 2018-11-18 NOTE — Progress Notes (Signed)
Occupational Therapy Session Note  Patient Details  Name: Jeremiah [name] MRN: [id number] Date of Birth: 1946/08/31  Today's Date: 11/18/2018 OT Individual Time: 1300-1400 OT Individual Time Calculation (min): 60 min    Short Term Goals: Week 1:  OT Short Term Goal 1 (Week 1): Pt will complete basic transfer with +1 assist in order to decrease caregiver burden OT Short Term Goal 2 (Week 1): Pt will complete sit>stand at sink with +1 assist in prep for ADL task OT Short Term Goal 3 (Week 1): Pt will don pants at bed level with min A  Skilled Therapeutic Interventions/Progress Updates:    Pt seen for OT session focusing on ADL re-training and functional mobility. Pt in supine upon arrival with NT present assisting with bed level toileting following incontinent BM. Pt agreeable to tx session and denying pain. He transferred to sitting EOB with mod A and max multi-modal cuing. Seated EOB, pt donned pants, requiring heavy steadying assist and reassurance when bending forward in attempt to thread pants on L LE. Pt stood from highly elevated EOB with +2 assist in 3 muskateer style in order to have pants pulled up. Pt with great difficulty achieving erect posture in standing requiring facilitation at L knee to fully engage, unable to maintain. Pt very fatigued following each standing trial. He completed squat pivot transfer to w/c, increased time required for motor planning and max A overall for transfer. Pt self propelled w/c ~7215ft in hallway, required to use L LE to assist, however, pt requiring max multi-modal cuing to lift and advance L LE, unable to fully clear foot to use to assist with advancement of w/c.  In therapy gym, max A squat pivot transfer to w/c with assist provided at glutes to facilitate anterior weight shift. Seated EOM, [facility] +2 assist, blocking of L knee. Mirror placed in front for visual feedback. Tolerated ~5 seconds in standing before abruptly returning  to sitting. Pt requiring lots of encouragement and rest breaks throughout mobility. Returned to w/c in same manner as described above. Taken back to room total A. Pt left seated in w/c at end of session, chair belt alarm on and all needs in reach.   Therapy Documentation Precautions:  Precautions Precautions: Fall Restrictions Weight Bearing Restrictions: Yes RLE Weight Bearing: Non weight bearing Pain:   No/denies pain   Therapy/Group: Individual Therapy  Doren Kaspar L 11/18/2018, 7:07 AM

## 2018-11-18 NOTE — Progress Notes (Signed)
Retta Diones, RN  Rehab Admission Coordinator  Physical Medicine and Rehabilitation  PMR Pre-admission  Signed  Date of Service:  11/15/2018 2:22 PM       Related encounter: ED to Hosp-Admission (Discharged) from 11/03/2018 in [facility]      Signed         Show:Clear all _0 Manual_1 Template_2 Copied  Added by: _3 Retta Diones, RN  _4 Hover for details PMR Admission Coordinator Pre-Admission Assessment  Patient: Jeremiah [name] is an 72 y.o., male MRN: [id number] DOB: [birth year] Height: _5  (175.3 cm) Weight: 73.4 kg                                                                                                                                                  Insurance Information HMO:     PPO: Yes     PCP:       IPA:       80/20:       OTHER:  Group #[id number] PRIMARY: U[employer] Medicare      Policy#: [id number]      Subscriber: patient CM Name: Abigail Butts      Phone#: [phone]     Fax#: [phone] Pre-Cert#: [id number] for initial 7 days with update due to Dorthula Nettles at fax [phone]      Employer:  Retired Benefits:  Phone #: [phone]     Name:  Online portal Eff. Date: 12/25/17     Deduct: $0      Out of Pocket Max: $4000 (met $4000)      Life Max: N/A CIR: $160 days 1-10      SNF: $0 days 1-20; $50 days 21-100 Outpatient: medical necessity     Co-Pay: $20/visit Home Health: 100%      Co-Pay: none DME: 57     Co-Pay: 20% Providers: in network  Medicaid Application Date:        Case Manager:   Disability Application Date:        Case Worker:    Emergency Publishing copy Information    Name Relation Home Work Mobile   Corona,Phyllis Spouse   [phone]   Talha, Iser   [phone]   Sante, Biedermann   [phone]     Current Medical History  Patient Admitting Diagnosis:  R AKA  History of Present Illness: A 72 year old right hand male with history of diastolic congestive  heart failure, hypertension, recurrent Clostridium difficile infection, type 2 diabetes mellitus. Per chart review patient lives with spouse. One level home 5 steps to entry. He was able to perform household ambulation with a rolling walker. Wife does assist with some basic ADLs. Wife does receive hemodialysis 3 days a week. 2 sons in the area work. Presented 11/04/2018 with ischemic ulceration right heel as well as  altered mental status. Leukocytosis 16,500 and low-grade fever. Cranial CT scan negative. X-rays of right foot showed no evidence of osteomyelitis. Blood cultures Proteus Mirabalis and maintain on anabolic therapy. 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 Servando Snare. Hospital course pain management. Acute blood loss anemia 7.2with plan for transfusion,leukocytosis improving13,600.Hypokalemia 2.7 with supplement added with plan follow-up labs.Follow up potassium 3.6 this afternoon.  Antibiotic therapy completed 11/11/2018. Stump sock placed per vascular surgery/[employer]. Subcutaneous heparin for DVT prophylaxis.Therapy evaluations completed with recommendations of physical medicine rehabilitation consult. Patient to be admitted for a comprehensive inpatient rehabilitation program.    Past Medical History      Past Medical History:  Diagnosis Date  . Arthritis    "joints; shoulders, knees, hands, back" (05/21/2018)  . C. difficile diarrhea 04/2018  . Diastolic CHF ([place])   . High cholesterol   . History of gout   . Hypertension   . Peripheral vascular disease ([place])   . Pneumonia    "couple times" (05/21/2018)  . Sleep apnea    "has mask; won't use" (05/21/2018)  . Type II diabetes mellitus ([place])     Family History  family history includes Diabetes in his brother, mother, and sister; Heart attack in his brother and father; Heart disease in his brother and father; Hypertension in his brother, mother, and  sister.  Prior Rehab/Hospitalizations: Had HH therapies 3 X a week, no preivous SNF admissions  Has the patient had major surgery during 100 days prior to admission? No  Current Medications   Current Facility-Administered Medications:  .  0.9 %  sodium chloride infusion, , Intravenous, PRN, Rhyne, Samantha J, PA-C, Last Rate: 10 mL/hr at 11/07/18 1606 .  0.9 %  sodium chloride infusion, , Intravenous, Continuous, Rhyne, Samantha J, PA-C, Stopped at 11/13/18 1050 .  acetaminophen (TYLENOL) tablet 650 mg, 650 mg, Oral, Q6H PRN, 650 mg at 11/08/18 1642 **OR** acetaminophen (TYLENOL) suppository 650 mg, 650 mg, Rectal, Q6H PRN, Rhyne, Samantha J, PA-C .  amLODipine (NORVASC) tablet 10 mg, 10 mg, Oral, Daily, Dorrell, Andree Elk, MD, 10 mg at 11/15/18 1014 .  aspirin chewable tablet 81 mg, 81 mg, Oral, Daily, Rhyne, Samantha J, PA-C, 81 mg at 11/15/18 1014 .  [START ON 11/16/2018] atorvastatin (LIPITOR) tablet 20 mg, 20 mg, Oral, Daily, Rebeca Alert, Raynaldo Opitz, MD .  brimonidine (ALPHAGAN) 0.2 % ophthalmic solution 1 drop, 1 drop, Both Eyes, BID, Rhyne, Samantha J, PA-C, 1 drop at 11/15/18 1017 .  docusate sodium (COLACE) capsule 100 mg, 100 mg, Oral, Daily, Rhyne, Samantha J, PA-C, 100 mg at 11/15/18 1014 .  ferrous sulfate tablet 325 mg, 325 mg, Oral, TID WC, Mosetta Anis, MD, 325 mg at 11/15/18 1014 .  heparin injection 5,000 Units, 5,000 Units, Subcutaneous, Q8H, Rhyne, Samantha J, PA-C, 5,000 Units at 11/15/18 4403 .  hydrALAZINE (APRESOLINE) injection 5 mg, 5 mg, Intravenous, Q6H PRN, Rehman, Areeg N, DO, 5 mg at 11/15/18 0844 .  insulin aspart (novoLOG) injection 0-15 Units, 0-15 Units, Subcutaneous, TID WC, Rhyne, Samantha J, PA-C, 2 Units at 11/15/18 1252 .  losartan (COZAAR) tablet 100 mg, 100 mg, Oral, Daily, Rhyne, Samantha J, PA-C, 100 mg at 11/14/18 2335 .  metoprolol succinate (TOPROL-XL) 24 hr tablet 50 mg, 50 mg, Oral, Daily, Rhyne, Samantha J, PA-C, 50 mg at 11/15/18 1014 .   ondansetron (ZOFRAN) injection 4 mg, 4 mg, Intravenous, Q6H PRN, Rhyne, Samantha J, PA-C .  phenol (CHLORASEPTIC) mouth spray  1 spray, 1 spray, Mouth/Throat, PRN, Rhyne, Samantha J, PA-C .  potassium chloride 10 mEq in 100 mL IVPB, 10 mEq, Intravenous, Q1 Hr x 6, Dorrell, Andree Elk, MD, Last Rate: 100 mL/hr at 11/15/18 1438, 10 mEq at 11/15/18 1438 .  potassium chloride SA (K-DUR,KLOR-CON) CR tablet 40 mEq, 40 mEq, Oral, BID, Dorrell, Andree Elk, MD, 40 mEq at 11/15/18 1142 .  promethazine (PHENERGAN) tablet 12.5 mg, 12.5 mg, Oral, Q6H PRN, Rhyne, Samantha J, PA-C .  senna-docusate (Senokot-S) tablet 1 tablet, 1 tablet, Oral, QHS PRN, Rhyne, Samantha J, PA-C .  timolol (TIMOPTIC) 0.5 % ophthalmic solution 1 drop, 1 drop, Both Eyes, BID, Rhyne, Samantha J, PA-C, 1 drop at 11/15/18 1016  Patients Current Diet:     Diet Order                  Diet Carb Modified Fluid consistency: Thin; Room service appropriate? Yes  Diet effective now               Precautions / Restrictions Precautions Precautions: Fall Restrictions Weight Bearing Restrictions: Yes RLE Weight Bearing: Non weight bearing   Has the patient had 2 or more falls or a fall with injury in the past year?Yes  Prior Activity Level Limited Community (1-2x/wk): Went out at least 1 X a week to [employer] / [place] Devices/Equipment: Environmental consultant (specify type) Home Equipment: Environmental consultant - 2 wheels, Shower seat, [employer] - single point, Bedside commode  Prior Device Use: Indicate devices/aids used by the patient prior to current illness, exacerbation or injury? Walker and [employer]  Prior Functional Level Prior Function Level of Independence: Needs assistance Gait / Transfers Assistance Needed: Able to perform household ambulation with RW ADL's / Homemaking Assistance Needed: Wife assists with sponge bathing at sink and other ADLs as needed (wife herself does HD MWF)  Self Care: Did  the patient need help bathing, dressing, using the toilet or eating?  Needed some help  Indoor Mobility: Did the patient need assistance with walking from room to room (with or without device)? Independent  Stairs: Did the patient need assistance with internal or external stairs (with or without device)? Independent  Functional Cognition: Did the patient need help planning regular tasks such as shopping or remembering to take medications? Independent  Current Functional Level Cognition  Overall Cognitive Status: Impaired/Different from baseline Current Attention Level: Selective Orientation Level: Oriented X4 Following Commands: Follows one step commands with increased time Safety/Judgement: Decreased awareness of safety, Decreased awareness of deficits General Comments: Unable to recall exercises from prior session.     Extremity Assessment (includes Sensation/Coordination)  Upper Extremity Assessment: Overall WFL for tasks assessed  Lower Extremity Assessment: Defer to PT evaluation    ADLs  Overall ADL's : Needs assistance/impaired Eating/Feeding: Independent, Sitting Grooming: Wash/dry hands, Wash/dry face, Sitting, Supervision/safety, Set up Grooming Details (indicate cue type and reason): supported sitting in recliner. Poor siting balance unsuported at EOB Upper Body Bathing: Minimal assistance, Sitting Upper Body Bathing Details (indicate cue type and reason): simulated Lower Body Bathing: Maximal assistance, Sitting/lateral leans, Moderate assistance Lower Body Bathing Details (indicate cue type and reason): simulated Upper Body Dressing : Sitting, Min guard Lower Body Dressing: Maximal assistance, Sitting/lateral leans Toilet Transfer: Maximal assistance, +2 for physical assistance Toilet Transfer Details (indicate cue type and reason): use of Denna Haggard from raised surface with cues for whole process Toileting- Clothing Manipulation and Hygiene: Total assistance,  Bed level Functional mobility during ADLs:  Total assistance, +2 for physical assistance, Cueing for safety    Mobility  Overal bed mobility: Needs Assistance Bed Mobility: Supine to Sit Rolling: Mod assist Supine to sit: Max assist, HOB elevated Sit to supine: Min assist General bed mobility comments: cues for sequencing and hand placement; use of chuck pad    Transfers  Overall transfer level: Needs assistance Equipment used: Rolling walker (2 wheeled) Transfer via Lift Equipment: Stedy Transfers: Sit to/from Stand Sit to Stand: Max assist, +2 physical assistance, From elevated surface Stand pivot transfers: Max assist, +2 physical assistance, From elevated surface Squat pivot transfers: +2 physical assistance, Total assist  Lateral/Scoot Transfers: Mod assist, +2 safety/equipment General transfer comment: Max A +2 sit<>stand from seat of Denna Haggard with barely clearing buttocks to get seat around under him and out from under him. Unable to maintain standing once seat removed    Ambulation / Gait / Stairs / Wheelchair Mobility  Ambulation/Gait Ambulation/Gait assistance: Mod assist Gait Distance (Feet): 1 Feet Assistive device: Rolling walker (2 wheeled) Gait Pattern/deviations: Step-to pattern, Trunk flexed, Leaning posteriorly General Gait Details: Took pivotal steps from bed to recliner requiring modA to prevent posterior LOB; cues for sequencing and to prevent pt sitting prematurely. Pt easily distracted not following commands well Gait velocity: Decreased    Posture / Balance Dynamic Sitting Balance Sitting balance - Comments: Pt with tendency to lean left but could self correct with cues and increased time, no LOB just leaning to left Balance Overall balance assessment: Needs assistance Sitting-balance support: Bilateral upper extremity supported(LLE supported) Sitting balance-Leahy Scale: Poor Sitting balance - Comments: Pt with tendency to lean left but could self  correct with cues and increased time, no LOB just leaning to left Standing balance support: During functional activity, Bilateral upper extremity supported Standing balance-Leahy Scale: Poor Standing balance comment: Max A +2 to come partially up with use of sara stedy, gait belt and chuck pad under him    Special needs/care consideration BiPAP/CPAP No CPM No Continuous Drip IV 0.9% NS 75 mL/hr Dialysis No         Life Vest No Oxygen No Special Bed No Trach Size No Wound Vac (area)    Skin Dressing to R AKA post op site                           Bowel mgmt: Last BM 11/14/18 Bladder mgmt: External urinary catheter, incontinence Diabetic mgmt Yes, takes oral medication at home    Previous Home Environment Living Arrangements: Spouse/significant other Available Help at Discharge: Family, Available 24 hours/day Type of Home: House Home Layout: One level Home Access: Stairs to enter Entrance Stairs-Rails: Right, Left Entrance Stairs-Number of Steps: 5 Bathroom Shower/Tub: Multimedia programmer: Handicapped height Home Care Services: Yes Type of Home Care Services: [place] (if known): advanced home care  Discharge Living Setting Plans for Discharge Living Setting: House, Lives with (comment)(Lives with wife) Type of Home at Discharge: House Discharge Home Layout: One level Discharge Home Access: Stairs to enter Entrance Stairs-Number of Steps: 4-5 steps but a ramp is to be built Does the patient have any problems obtaining your medications?: No  Social/Family/Support Systems Patient Roles: Spouse, Parent(Has a wife and 2 sons.) Contact Information: Zarius Furr - wife - [phone] Anticipated Caregiver: wife and sons Anticipated Caregiver's Contact Information: Demarrius Guerrero - son - [phone] Ability/Limitations of Caregiver: Wife goes to HD M-W-F, sons work. Caregiver Availability: Other (  Comment)(Wife understands the need for additional  assist at DC) Discharge Plan Discussed with Primary Caregiver: Yes Is Caregiver In Agreement with Plan?: Yes Does Caregiver/Family have Issues with Lodging/Transportation while Pt is in Rehab?: No  Goals/Additional Needs Patient/Family Goal for Rehab: PT supervision, OT supervision to min assist goals Expected length of stay: 10-16 days Cultural Considerations: [employer] Dietary Needs: Carb mod, med cal, thin liquids Equipment Needs: TBD Pt/Family Agrees to Admission and willing to participate: Yes(I spoke with patient's wife and son, [name].) Program Orientation Provided & Reviewed with Pt/Caregiver Including Roles  & Responsibilities: Yes  Decrease burden of Care through IP rehab admission: N/A  Possible need for SNF placement upon discharge: Not planned  Patient Condition: This patient's medical and functional status has changed since the consult dated: 11/10/18 in which the Rehabilitation Physician determined and documented that the patient's condition is appropriate for intensive rehabilitative care in an inpatient rehabilitation facility. See "History of Present Illness" (above) for medical update. Functional changes are:  Currently requiring max assist +2 for mobility and ADLs. Patient's medical and functional status update has been discussed with the Rehabilitation physician and patient remains appropriate for inpatient rehabilitation. Will admit to inpatient rehab today.  Preadmission Screen Completed By:  Retta Diones, 11/15/2018 4:14 PM ______________________________________________________________________   Discussed status with Dr. Posey Pronto on 11/15/18 at 1613 and received telephone approval for admission today.  Admission Coordinator:  Retta Diones, time 1613/Date 12/16/17           Cosigned by: Jamse Arn, MD at 11/15/2018 4:27 PM  Revision History

## 2018-11-18 NOTE — Progress Notes (Signed)
Physical Therapy Session Note  Patient Details  Name: Jeremiah [name] MRN: [id number] Date of Birth: 05/23/1946  Today's Date: 11/18/2018 PT Individual Time: 0710-0815 and 0900-1000 PT Individual Time Calculation (min): 65 min and 60 min (total 125 min)   Short Term Goals: Week 1:  PT Short Term Goal 1 (Week 1): Pt will complete bed mobility with mod A PT Short Term Goal 2 (Week 1): Pt will maintain sitting balance EOB with SBA consistently PT Short Term Goal 3 (Week 1): Pt will propel manual w/c x 100 ft with min A PT Short Term Goal 4 (Week 1): Pt will initiate standing as safe and able  Skilled Therapeutic Interventions/Progress Updates: Tx 1: Pt received supine in bed, when questioned about pain pt reports "No pain, it just hurts some".; pt reports excited to get OOB as he did not get up yesterday. Rolling R/L minA with bedrails to don pants totalA. Supine>sit modA for trunk control and moderate verbal cues for hand placement and technique to push sidelying>sitting. Lateral scoot bed>w/c with transfer board and maxA, improving initiation however limited push through LLE to assist. Grooming at sink with setupA and increased time. Missed 10 min d/t pt requesting to eat breakfast. Discussed home entry; per pt his son is working on building a ramp to get into house. Sit <>stand in bariatric stedy maxA +2 from w/c, modA +2 from stedy seat x3 trials, moderate verbal/tactile cues and facilitation for hip/trunk extension in terminal stance. W/c propulsion x150' with BUE and S, min cues for technique for hand placement to increase efficiency. W/c propulsion around cones for focus on direction changes and obstacle negotiation, S overall. Remained in w/c, lap belt alarm intact and all needs in reach.   Tx 2: Pt received seated in w/c, denies pain and agreeable to treatment. W/c propulsion to gym x150' BUE for strengthening and endurance. Lateral scoot transfer modA to mat table with transfer board,  improving initiation and forward weight shift. Sit >stand with elevated mat table and stedy max/totalA; pt unable to come to full standing d/t lack of hip/trunk extension and pt fearful of falling. Pt's brief/pants noted to be soiled; therapist informed pt of plan to head back to room d/t wet brief, and pt states "oh yeah, it's wet". Educated pt regarding importance of alerting staff to need to void so he can do so continently, or at the very least alerting staff immediately after in cases of urgency so that pt does not sit in wet brief and cause damage to skin; pt verbalizes understanding, therapist discussed timed voids with RN. Returned to bed mod/maxA slideboard transfer. Rolling R/L to change brief incontinent of urine, minA rolling R/L. Once new brief placed, pt informed therapist that he had incontinent bowel movement. Repeated rolling/brief change as above. Supine>sit modA. Returned to w/c modA with transfer board. Remained seated in w/c at end of session, all needs in reach and lap belt alarm intact.      Therapy Documentation Precautions:  Precautions Precautions: Fall Restrictions Weight Bearing Restrictions: Yes RLE Weight Bearing: Non weight bearing    Therapy/Group: Individual Therapy  Harlon Dittylizabeth J Seigle 11/18/2018, 8:14 AM

## 2018-11-19 ENCOUNTER — Inpatient Hospital Stay (HOSPITAL_COMMUNITY): Payer: Medicare Other | Admitting: Occupational Therapy

## 2018-11-19 ENCOUNTER — Inpatient Hospital Stay (HOSPITAL_COMMUNITY): Payer: Medicare Other | Admitting: Physical Therapy

## 2018-11-19 LAB — GLUCOSE, CAPILLARY
Glucose-Capillary: 115 mg/dL — ABNORMAL HIGH (ref 70–99)
Glucose-Capillary: 154 mg/dL — ABNORMAL HIGH (ref 70–99)
Glucose-Capillary: 109 mg/dL — ABNORMAL HIGH (ref 70–99)
Glucose-Capillary: 118 mg/dL — ABNORMAL HIGH (ref 70–99)

## 2018-11-19 NOTE — Progress Notes (Signed)
[place] PHYSICAL MEDICINE & REHABILITATION PROGRESS NOTE  Subjective/Complaints: Patient seen sitting up in a chair this morning working with therapies.  He states he slept well overnight.  He notes his diarrhea is improving, discussed with nursing.  ROS: Denies CP, shortness of breath, nausea, vomiting.  Objective: Vital Signs: Blood pressure (!) 149/53, pulse 72, temperature 99.1 F (37.3 C), temperature source Oral, resp. rate 18, height 5\' 9"  (1.753 m), weight 73.6 kg, SpO2 98 %. No results found. No results for input(s): WBC, HGB, HCT, PLT in the last 72 hours. No results for input(s): NA, K, CL, CO2, GLUCOSE, BUN, CREATININE, CALCIUM in the last 72 hours.  Physical Exam: BP (!) 149/53 (BP Location: Right Arm)   Pulse 72   Temp 99.1 F (37.3 C) (Oral)   Resp 18   Ht 5\' 9"  (1.753 m)   Wt 73.6 kg   SpO2 98%   BMI 23.96 kg/m  Constitutional: No distress . Vital signs reviewed. HENT: Normocephalic.  Atraumatic. Eyes: EOMI. No discharge. Cardiovascular: RRR.  No JVD. Respiratory: CTA bilaterally.  Normal effort. GI: BS +. Non-distended. Musc: Right AKA Neurological: He is alert.  Some delay in processing, unchanged.  Follows commands. Motor: Bilateral upper extremities: 4/5 proximal distal, stable Right lower extremity: Hip flexion 4+/5, stable Left lower extremity: Hip flexion 3/5, knee extension 4-/5, ankle dorsiflexion 4/5, unchanged  Skin: Right AKA with dressing C/D/I Psychiatric: His affect is blunt. His speech is delayed. He is slowed.   Assessment/Plan: 1. 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  Physiatrist is providing close team supervision and 24 hour management of active medical problems listed below.  Physiatrist and rehab team continue to assess barriers to discharge/monitor patient progress toward functional and medical goals  Care  Tool:  Bathing  Bathing activity did not occur: Refused           Bathing assist       Upper Body Dressing/Undressing Upper body dressing   What is the patient wearing?: Pull over shirt    Upper body assist Assist Level: Minimal Assistance - Patient > 75%    Lower Body Dressing/Undressing Lower body dressing      What is the patient wearing?: Pants     Lower body assist Assist for lower body dressing: 2 Helpers     Toileting Toileting    Toileting assist Assist for toileting: Moderate Assistance - Patient 50 - 74%     Transfers Chair/bed transfer  Transfers assist  Chair/bed transfer activity did not occur: N/A  Chair/bed transfer assist level: 2 Helpers     Locomotion Ambulation   Ambulation assist   Ambulation activity did not occur: Safety/medical concerns          Walk 10 feet activity   Assist  Walk 10 feet activity did not occur: Safety/medical concerns        Walk 50 feet activity   Assist Walk 50 feet with 2 turns activity did not occur: Safety/medical concerns         Walk 150 feet activity   Assist Walk 150 feet activity did not occur: Safety/medical concerns         Walk 10 feet on uneven surface  activity   Assist Walk 10 feet on uneven surfaces activity did not occur: Safety/medical concerns         Wheelchair     Assist Will patient use wheelchair at  discharge?: Yes Type of Wheelchair: Manual    Wheelchair assist level: Supervision/Verbal cueing Max wheelchair distance: 150    Wheelchair 50 feet with 2 turns activity    Assist        Assist Level: Supervision/Verbal cueing   Wheelchair 150 feet activity     Assist Wheelchair 150 feet activity did not occur: Safety/medical concerns   Assist Level: Supervision/Verbal cueing      Medical Problem List and Plan: 1.  Decreased functional mobility secondary to right AKA 11/09/2018  Continue CIR   Stump shrinker 2.  DVT  Prophylaxis/Anticoagulation: Subcutaneous heparin. Monitor for any bleeding episodes 3. Pain Management:  Percocet as needed 4. Mood:  Provide emotional support 5. Neuropsych: This patient is not fully capable of making decisions on his own behalf. 6. Skin/Wound Care:  Routine skin checks 7. Fluids/Electrolytes/Nutrition/hypokalemia:  Routine ins and outs   BMP within acceptable range on 12/24  Labs ordered for Monday  8. Acute blood loss anemia. Continue iron supplement  Hemoglobin 8.5 on 12/24  Labs ordered for Monday  Continue to monitor 9. Hypertension.Norvasc 10 mg daily, Cozaar 100 mg daily, Toprol-XL 50 mg daily.   Labile on 12/27, will monitor for trend 10. Diabetes mellitus with peripheral neuropathy. Hemoglobin A1c 6.7.SSI. Patient on Amaryl 4 mg twice a day prior to admission. Resume as needed  Relatively controlled on 12/27 11. Hyperlipidemia. Zocor 12.  Hypoalbuminemia  Supplement initiated on 12/25 13.  Leukocytosis  Afebrile  WBCs 12.8 on 12/24  Labs ordered for Monday  Continue to monitor 14. Diarrhea  Fiber added on 12/26  Improving on 12/27  LOS: 4 days A FACE TO FACE EVALUATION WAS PERFORMED  Deray Dawes Karis Jubanil Maram Bently 11/19/2018, 10:22 AM

## 2018-11-19 NOTE — Progress Notes (Signed)
Occupational Therapy Session Note  Patient Details  Name: Jeremiah [name] MRN: [id number] Date of Birth: [birth year]  Today's Date: 11/19/2018 OT Individual Time: [id number] OT Individual Time Calculation (min): 75 min    Short Term Goals: Week 1:  OT Short Term Goal 1 (Week 1): Pt will complete basic transfer with +1 assist in order to decrease caregiver burden OT Short Term Goal 2 (Week 1): Pt will complete sit>stand at sink with +1 assist in prep for ADL task OT Short Term Goal 3 (Week 1): Pt will don pants at bed level with min A  Skilled Therapeutic Interventions/Progress Updates:    Pt seen for OT ADL session focusing on functional sitting balance, core stability and transfer. Pt in supine upon arrival, awake and denying pain, agreeable to tx session. Pt reported that nursing staff assisted with bathing already and only needed to get dressed. He transferred to sitting EOB with max A and multimodal cuing for sequencing/technique. Pt initially requiring max A for static sitting balance EOB 2/2 posterior and R bias. Addressed working on finding BOS and balance point seated EOB with multi-modal cuing. Pt progressing to requiring min A overall for sitting balance with occasional close supervision. He dressed seated EOB with min-mod A for sitting balance during functional task. PT requiring mod-max A with max VCs for anterior weight shift in attempts to thread pants over L foot. He stood from highly elevated EOB in 3 musketeer style with +2 assist in order to pull pants up. Pt with difficulty achieving erect posture and tolerating ~5 seconds in standing before requiring seated rest break.  He completed squat pivot transfer to w/c, ultimately requiring max-total A +2 for transfer to w/c with max multi-modal cuing for motor planning and sequencing to scoot hips back into chair.  He managed w/c throughout room with supervision and VCs for w/c management. Grooming tasks completed from w/c level at sink  with supervision.  Pt requesting to eat breakfast prior to any addition activity. Addressed sitting up in w/c in unsupported sitting to eat as well as anterior weightshift to lean forward to retrieve needed condiments, etc.  Completed LE and core strengthening exercises, x10 LAR, x5 knee raises (barely able to clear L foot from floor), and x5 reaching to floor to retrieve items. Pt requiring frequent rest breaks throughout with limited strength to complete full ROM during exercises. Pt left seated in w/c at sink completing grooming tasks with chair belt alarm donned and nursing staff present. Throughout session, pt required significantly increased time and cuing for motor planning and is limited greatly by very poor functional activity tolerance.    Therapy Documentation Precautions:  Precautions Precautions: Fall Restrictions Weight Bearing Restrictions: Yes RLE Weight Bearing: Non weight bearing Pain:   No/denies pain   Therapy/Group: Individual Therapy  Lajuan Kovaleski L 11/19/2018, 6:47 AM

## 2018-11-19 NOTE — Progress Notes (Signed)
Physical Therapy Session Note  Patient Details  Name: Jeremiah [name] MRN: [id number] Date of Birth: 23-Feb-1946  Today's Date: 11/19/2018 PT Individual Time: 1345-1420 PT Individual Time Calculation (min): 35 min   Short Term Goals: Week 1:  PT Short Term Goal 1 (Week 1): Pt will complete bed mobility with mod A PT Short Term Goal 2 (Week 1): Pt will maintain sitting balance EOB with SBA consistently PT Short Term Goal 3 (Week 1): Pt will propel manual w/c x 100 ft with min A PT Short Term Goal 4 (Week 1): Pt will initiate standing as safe and able  Skilled Therapeutic Interventions/Progress Updates:    Patient received in bed, willing to participate in therapy and requesting that his residual limb be wrapped. Performed rolling to either side with ModA to doff pants and expose residual limb, then performed residual limb wrapping with totalA, following by additional rolling with ModA to don pants. Able to perform supine to sit with MaxA however once at EOB patient requested to sit back down as he felt ill and has been having diarrhea all day, return to supine with MinA. Otherwise worked on exercises for B LEs and stretching for L LE as tolerated, patient continued with upset stomach limiting intensity of treatment session. He was left in bed with bed alarm active, RN present and attending, all needs otherwise met this afternoon. 25 minutes of therapy missed due to malaise/diarrhea.   Therapy Documentation Precautions:  Precautions Precautions: Fall Restrictions Weight Bearing Restrictions: Yes RLE Weight Bearing: Non weight bearing General: PT Amount of Missed Time (min): 25 Minutes PT Missed Treatment Reason: Patient ill (Comment)(stomach pains, diarrhea ) Vital Signs:  Pain: Pain Assessment Pain Scale: 0-10 Pain Score: 5  Faces Pain Scale: Hurts a little bit Pain Type: Acute pain Pain Location: Abdomen Pain Orientation: Mid;Anterior Pain Descriptors / Indicators:  Aching;Discomfort Pain Onset: On-going Patients Stated Pain Goal: 0 Pain Intervention(s): Medication (See eMAR);Repositioned Multiple Pain Sites: No    Therapy/Group: Individual Therapy  Deniece Ree PT, DPT, CBIS  Supplemental Physical Therapist [facility]    Pager [phone] Acute Rehab Office [phone]   11/19/2018, 2:33 PM

## 2018-11-19 NOTE — Progress Notes (Signed)
Occupational Therapy Session Note  Patient Details  Name: Jeremiah SpiceWalter L Rison MRN: [id number] Date of Birth: 06-30-1946  Today's Date: 11/19/2018 OT Individual Time: 1103-1200 OT Individual Time Calculation (min): 57 min    Short Term Goals: Week 1:  OT Short Term Goal 1 (Week 1): Pt will complete basic transfer with +1 assist in order to decrease caregiver burden OT Short Term Goal 2 (Week 1): Pt will complete sit>stand at sink with +1 assist in prep for ADL task OT Short Term Goal 3 (Week 1): Pt will don pants at bed level with min A  Skilled Therapeutic Interventions/Progress Updates:    Pt in bed to start session.  Had him work on rolling to the right side and then sit up.  Max instructional cueing with max assist to complete.  Once sitting on the EOB, had pt work on Restaurant manager, fast fooddonning sweatpants.  He needed max assist to thread the left leg and min assist for the right.  Mod assist needed for dynamic sitting balance while completing this secondary to forward lean.  He then needed max assist for pulling his pants over his hips with lateral leans side to side.  Sliding board transfer was completed to the wheelchair with mod assist.  Next, had pt propel his wheelchair down to the nurses station with supervision and increased time.  Therapist assisted him the rest of the way to the therapy gym where he then worked on BUE therex exercises.  He completed 1 set of 10 repetitions with the lightest resistance band for shoulder flexion, elbow extension, and shoulder horizontal abduction.  Min assist and mod demonstrational cueing needed for all exercises.  Pt's son present as well, and educated on performing exercises when he is not in therapy. Returned to room at end of session with pt up in the wheelchair and chair alarm belt in place.    Therapy Documentation Precautions:  Precautions Precautions: Fall Restrictions Weight Bearing Restrictions: Yes RLE Weight Bearing: Non weight bearing  Pain: Pain  Assessment Pain Scale: Faces Pain Score: 0-No pain Faces Pain Scale: Hurts a little bit Pain Type: Acute pain Pain Location: Leg Pain Orientation: Right Pain Descriptors / Indicators: Discomfort Pain Onset: On-going Pain Intervention(s): Repositioned ADL:   Vision   Perception    Praxis   Exercises:   Other Treatments:     Therapy/Group: Individual Therapy  Archimedes Harold OTR/L 11/19/2018, 12:35 PM

## 2018-11-19 NOTE — Plan of Care (Signed)
  Problem: RH BOWEL ELIMINATION Goal: RH STG MANAGE BOWEL WITH ASSISTANCE Description STG Manage Bowel with Assistance. Outcome: Progressing Goal: RH STG MANAGE BOWEL W/MEDICATION W/ASSISTANCE Description STG Manage Bowel with Medication with Assistance. Outcome: Progressing   Problem: RH BLADDER ELIMINATION Goal: RH STG MANAGE BLADDER WITH ASSISTANCE Description STG Manage Bladder With Assistance Outcome: Progressing Goal: RH STG MANAGE BLADDER WITH MEDICATION WITH ASSISTANCE Description STG Manage Bladder With Medication With Assistance. Outcome: Progressing Goal: RH STG MANAGE BLADDER WITH EQUIPMENT WITH ASSISTANCE Description STG Manage Bladder With Equipment With Assistance Outcome: Progressing   Problem: RH SKIN INTEGRITY Goal: RH STG SKIN FREE OF INFECTION/BREAKDOWN Outcome: Progressing Goal: RH STG MAINTAIN SKIN INTEGRITY WITH ASSISTANCE Description STG Maintain Skin Integrity With Assistance. Outcome: Progressing Goal: RH STG ABLE TO PERFORM INCISION/WOUND CARE W/ASSISTANCE Description STG Able To Perform Incision/Wound Care With Assistance. Outcome: Progressing   Problem: RH SAFETY Goal: RH STG ADHERE TO SAFETY PRECAUTIONS W/ASSISTANCE/DEVICE Description STG Adhere to Safety Precautions With Assistance/Device. Outcome: Progressing Goal: RH STG DECREASED RISK OF FALL WITH ASSISTANCE Description STG Decreased Risk of Fall With Assistance. Outcome: Progressing   Problem: RH PAIN MANAGEMENT Goal: RH STG PAIN MANAGED AT OR BELOW PT'S PAIN GOAL Outcome: Progressing   Problem: RH KNOWLEDGE DEFICIT LIMB LOSS Goal: RH STG INCREASE KNOWLEDGE OF SELF CARE AFTER LIMB LOSS Outcome: Progressing   

## 2018-11-20 ENCOUNTER — Inpatient Hospital Stay (HOSPITAL_COMMUNITY): Payer: Medicare Other

## 2018-11-20 ENCOUNTER — Inpatient Hospital Stay (HOSPITAL_COMMUNITY): Payer: Medicare Other | Admitting: Physical Therapy

## 2018-11-20 ENCOUNTER — Inpatient Hospital Stay (HOSPITAL_COMMUNITY): Payer: Medicare Other | Admitting: Occupational Therapy

## 2018-11-20 LAB — GLUCOSE, CAPILLARY
Glucose-Capillary: 121 mg/dL — ABNORMAL HIGH (ref 70–99)
Glucose-Capillary: 154 mg/dL — ABNORMAL HIGH (ref 70–99)
Glucose-Capillary: 94 mg/dL (ref 70–99)
Glucose-Capillary: 174 mg/dL — ABNORMAL HIGH (ref 70–99)

## 2018-11-20 MED ORDER — SACCHAROMYCES BOULARDII 250 MG PO CAPS
250.0000 mg | ORAL_CAPSULE | Freq: Two times a day (BID) | ORAL | Status: DC
  Administered 2018-11-20 – 2018-11-21 (×3): 250 mg via ORAL
  Filled 2018-11-20 (×3): qty 1

## 2018-11-20 MED ORDER — CALCIUM POLYCARBOPHIL 625 MG PO TABS
1250.0000 mg | ORAL_TABLET | Freq: Every day | ORAL | Status: DC
  Administered 2018-11-21 – 2018-12-07 (×17): 1250 mg via ORAL
  Filled 2018-11-20 (×17): qty 2

## 2018-11-20 NOTE — Progress Notes (Signed)
Physical Therapy Session Note  Patient Details  Name: Jeremiah [name] MRN: [id number] Date of Birth: 03-09-1946  Today's Date: 11/20/2018 PT Individual Time: 1000-1100; 1300-1400 PT Individual Time Calculation (min): 60 min and 60 min  Short Term Goals: Week 1:  PT Short Term Goal 1 (Week 1): Pt will complete bed mobility with mod A PT Short Term Goal 2 (Week 1): Pt will maintain sitting balance EOB with SBA consistently PT Short Term Goal 3 (Week 1): Pt will propel manual w/c x 100 ft with min A PT Short Term Goal 4 (Week 1): Pt will initiate standing as safe and able  Skilled Therapeutic Interventions/Progress Updates:    Session 1: Pt received seated in w/c in room, reports just having had an incontinent BM and getting ready to transfer back to bed with NT for pericare. Sliding board transfer w/c to bed with max A with increased time and cueing. Sit to supine mod A. Rolling L/R with min A for dependent pericare and clothing management. Supine to sit with mod A. Slide board transfer back to w/c with max A. Manual w/c propulsion x 150 ft with BUE and Supervision, increased time needed to complete. Slide board transfer w/c to/from mat table with max A with cueing for head/hips relationship during transfer. Scooting L/R and forward/back while seated on mat table with visual cues for target to scoot towards. Demonstration of weight shifting with scooting to increase distance. Pt demos fair ability to follow cues and demonstration and carryover into task. Sit to stand x 3 reps to stedy with max to total A x 2. Pt is able to stand with LLE but unable to extend hips and trunk for full upright posture. Pt left seated in w/c in room with needs in reach, quick release belt and chair alarm in place.  Session 2: Pt received seated in w/c in room, agreeable to PT. No complaints of pain. Manual w/c propulsion x 150 ft with BUE and Supervision, increased time needed to complete. Slide board transfer w/c to  mat table with max A, increase time and cues needed. Sitting balance EOM with close SBA, focus on lateral leans L/R with CGA to min A onto mat table and return to midline, 2 x 10 reps each direction. Sit to supine mod A. Pt has incontinence of bowel in supine position. Supine to sit with mod A. Slide board transfer mat table to w/c with max a. Dependent transport via w/c back to room for time conservation. Slide board transfer w/c to/from bed with max A. Supine pericare and brief change dependent with min A for rolling L/R with use of bedrails. Pt left seated in w/c in room with needs in reach, quick release belt and chair alarm in place.  Therapy Documentation Precautions:  Precautions Precautions: Fall Restrictions Weight Bearing Restrictions: Yes RLE Weight Bearing: Non weight bearing   Therapy/Group: Individual Therapy  Peter Congoaylor Isiaah Cuervo, PT, DPT  11/20/2018, 12:14 PM

## 2018-11-20 NOTE — Progress Notes (Signed)
[place] PHYSICAL MEDICINE & REHABILITATION PROGRESS NOTE  Subjective/Complaints: Patient seen laying in bed this morning.  He states he slept well overnight.  He states he has persistent diarrhea.  Discussed with nursing.  ROS: + Diarrhea.  Denies CP, shortness of breath, nausea, vomiting.  Objective: Vital Signs: Blood pressure (!) 131/52, pulse 66, temperature 98.6 F (37 C), temperature source Oral, resp. rate 16, height 5\' 9"  (1.753 m), weight 73.6 kg, SpO2 99 %. No results found. No results for input(s): WBC, HGB, HCT, PLT in the last 72 hours. No results for input(s): NA, K, CL, CO2, GLUCOSE, BUN, CREATININE, CALCIUM in the last 72 hours.  Physical Exam: BP (!) 131/52 (BP Location: Right Arm)   Pulse 66   Temp 98.6 F (37 C) (Oral)   Resp 16   Ht 5\' 9"  (1.753 m)   Wt 73.6 kg   SpO2 99%   BMI 23.96 kg/m  Constitutional: No distress . Vital signs reviewed. HENT: Normocephalic.  Atraumatic. Eyes: EOMI. No discharge. Cardiovascular: RRR.  No JVD. Respiratory: CTA bilaterally.  Normal effort. GI: BS +. Non-distended. Musc: Right AKA Neurological: He is alert.  Some delay in processing, unchanged.  Follows commands. Motor: Bilateral upper extremities: 4/5 proximal distal, stable Right lower extremity: Hip flexion 4+/5, stable Left lower extremity: Hip flexion 3/5, knee extension 4-/5, ankle dorsiflexion 4/5, stable  Skin: Right AKA with dressing C/D/I Psychiatric: His affect is blunt. His speech is delayed. He is slowed.   Assessment/Plan: 1. 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  Physiatrist is providing close team supervision and 24 hour management of active medical problems listed below.  Physiatrist and rehab team continue to assess barriers to discharge/monitor patient progress toward functional and medical goals  Care Tool:  Bathing  Bathing  activity did not occur: Refused           Bathing assist       Upper Body Dressing/Undressing Upper body dressing   What is the patient wearing?: Pull over shirt    Upper body assist Assist Level: Minimal Assistance - Patient > 75%    Lower Body Dressing/Undressing Lower body dressing      What is the patient wearing?: Pants     Lower body assist Assist for lower body dressing: Maximal Assistance - Patient 25 - 49%     Toileting Toileting    Toileting assist Assist for toileting: Moderate Assistance - Patient 50 - 74%     Transfers Chair/bed transfer  Transfers assist  Chair/bed transfer activity did not occur: N/A  Chair/bed transfer assist level: Moderate Assistance - Patient 50 - 74%(sliding board)     Locomotion Ambulation   Ambulation assist   Ambulation activity did not occur: Safety/medical concerns          Walk 10 feet activity   Assist  Walk 10 feet activity did not occur: Safety/medical concerns        Walk 50 feet activity   Assist Walk 50 feet with 2 turns activity did not occur: Safety/medical concerns         Walk 150 feet activity   Assist Walk 150 feet activity did not occur: Safety/medical concerns         Walk 10 feet on uneven surface  activity   Assist Walk 10 feet on uneven surfaces activity did not occur: Safety/medical concerns         Wheelchair  Assist Will patient use wheelchair at discharge?: Yes Type of Wheelchair: Manual    Wheelchair assist level: Supervision/Verbal cueing Max wheelchair distance: 150    Wheelchair 50 feet with 2 turns activity    Assist        Assist Level: Supervision/Verbal cueing   Wheelchair 150 feet activity     Assist Wheelchair 150 feet activity did not occur: Safety/medical concerns   Assist Level: Supervision/Verbal cueing      Medical Problem List and Plan: 1.  Decreased functional mobility secondary to right AKA  11/09/2018  Continue CIR   Stump shrinker 2.  DVT Prophylaxis/Anticoagulation: Subcutaneous heparin. Monitor for any bleeding episodes 3. Pain Management:  Percocet as needed 4. Mood:  Provide emotional support 5. Neuropsych: This patient is not fully capable of making decisions on his own behalf. 6. Skin/Wound Care:  Routine skin checks 7. Fluids/Electrolytes/Nutrition/hypokalemia:  Routine ins and outs   BMP within acceptable range on 12/24  Labs ordered for Monday 8. Acute blood loss anemia. Continue iron supplement  Hemoglobin 8.5 on 12/24  Labs ordered for Monday  Continue to monitor 9. Hypertension.Norvasc 10 mg daily, Cozaar 100 mg daily, Toprol-XL 50 mg daily.   Labile on 12/28 10. Diabetes mellitus with peripheral neuropathy. Hemoglobin A1c 6.7.SSI. Patient on Amaryl 4 mg twice a day prior to admission. Resume as needed  Slightly labile on 12/20 11. Hyperlipidemia. Zocor 12.  Hypoalbuminemia  Supplement initiated on 12/25 13.  Leukocytosis  Afebrile  WBCs 12.8 on 12/24  Labs ordered  Labs ordered for Monday  Continue to monitor 14. Diarrhea  Fiber added on 12/26, increased on 12/28  Will discuss with pharmacy if medication related -iron DC'd  Probiotic started on 12/28  LOS: 5 days A FACE TO FACE EVALUATION WAS PERFORMED  Ankit Karis Jubanil Patel 11/20/2018, 9:38 AM

## 2018-11-20 NOTE — Progress Notes (Signed)
Occupational Therapy Session Note  Patient Details  Name: Jeremiah [name] MRN: [id number] Date of Birth: 31-Mar-1946  Today's Date: 11/20/2018 OT Individual Time: 9311-2162 OT Individual Time Calculation (min): 72 min    Short Term Goals: Week 1:  OT Short Term Goal 1 (Week 1): Pt will complete basic transfer with +1 assist in order to decrease caregiver burden OT Short Term Goal 2 (Week 1): Pt will complete sit>stand at sink with +1 assist in prep for ADL task OT Short Term Goal 3 (Week 1): Pt will don pants at bed level with min A  Skilled Therapeutic Interventions/Progress Updates:    1:1. Pt reporting pain in buttock and incontinent BM in brief upon arrival. Total A peri care with MOD A rolling B with VC and barrier cream applied for comfort. Pt bathes at bed level with MOD A for sitting balance/anterior weight shift when bathing UB/reaching towards L foot for bathing. Pt overall max A for donning pants rolling B with facilitation of hips with bed pad. Pt able to reach back to pull pants past hips. Pt slide board transfer with MAX A EOB>w/c with multi modal cueing for head hips relationship/VC for initation of scoot. Pt grooms at sink with set up. PT propels w/c part way to gym for BUE strength and endurance. Pt completes 3x30 basketball passes (chest, bounce and overhead) for BUE strength and endurance with rest breaks required suring overhead passes d/t decreased strength/endurance. Exited session with pt returned to toom ni w/c, call light inreach belt alarm on a nd all needs met  Therapy Documentation Precautions:  Precautions Precautions: Fall Restrictions Weight Bearing Restrictions: Yes RLE Weight Bearing: Non weight bearing General:   Vital Signs: Therapy Vitals Temp: 98.6 F (37 C) Temp Source: Oral Pulse Rate: 66 Resp: 16 BP: (!) 131/52 Patient Position (if appropriate): Lying Oxygen Therapy SpO2: 99 % O2 Device: Room Air Pain: Pain Assessment Pain Scale:  0-10 Pain Score: 0-No pain ADL:   Vision   Perception    Praxis   Exercises:   Other Treatments:     Therapy/Group: Individual Therapy  Tonny Branch 11/20/2018, 8:58 AM

## 2018-11-21 ENCOUNTER — Inpatient Hospital Stay (HOSPITAL_COMMUNITY): Payer: Medicare Other | Admitting: Occupational Therapy

## 2018-11-21 ENCOUNTER — Inpatient Hospital Stay (HOSPITAL_COMMUNITY): Payer: Medicare Other

## 2018-11-21 DIAGNOSIS — R7309 Other abnormal glucose: Secondary | ICD-10-CM

## 2018-11-21 LAB — CBC
HCT: 23.5 % — ABNORMAL LOW (ref 39.0–52.0)
Hemoglobin: 7.7 g/dL — ABNORMAL LOW (ref 13.0–17.0)
MCH: 27.5 pg (ref 26.0–34.0)
MCHC: 32.8 g/dL (ref 30.0–36.0)
MCV: 83.9 fL (ref 80.0–100.0)
Platelets: 285 10*3/uL (ref 150–400)
RBC: 2.8 MIL/uL — ABNORMAL LOW (ref 4.22–5.81)
RDW: 14.7 % (ref 11.5–15.5)
WBC: 10.9 10*3/uL — ABNORMAL HIGH (ref 4.0–10.5)
nRBC: 0 % (ref 0.0–0.2)

## 2018-11-21 LAB — GLUCOSE, CAPILLARY
Glucose-Capillary: 140 mg/dL — ABNORMAL HIGH (ref 70–99)
Glucose-Capillary: 115 mg/dL — ABNORMAL HIGH (ref 70–99)
Glucose-Capillary: 126 mg/dL — ABNORMAL HIGH (ref 70–99)
Glucose-Capillary: 169 mg/dL — ABNORMAL HIGH (ref 70–99)

## 2018-11-21 MED ORDER — FIDAXOMICIN 200 MG PO TABS
200.0000 mg | ORAL_TABLET | Freq: Two times a day (BID) | ORAL | Status: AC
  Administered 2018-11-21 – 2018-12-01 (×20): 200 mg via ORAL
  Filled 2018-11-21 (×20): qty 1

## 2018-11-21 MED ORDER — FIDAXOMICIN 200 MG PO TABS: 200.0000 mg | ORAL_TABLET | Freq: Two times a day (BID) | ORAL | Status: DC

## 2018-11-21 NOTE — Progress Notes (Signed)
Occupational Therapy Session Note  Patient Details  Name: Jeremiah [name] MRN: [id number] Date of Birth: [birth year]  Today's Date: 11/21/2018  OT Individual Time: [id number] and 1015-1130 OT Individual Time Calculation (min): 75 min and 75 min   Short Term Goals: Week 1:  OT Short Term Goal 1 (Week 1): Pt will complete basic transfer with +1 assist in order to decrease caregiver burden OT Short Term Goal 2 (Week 1): Pt will complete sit>stand at sink with +1 assist in prep for ADL task OT Short Term Goal 3 (Week 1): Pt will don pants at bed level with min A  Skilled Therapeutic Interventions/Progress Updates:    Session One: Pt seen for OT session focusing on ADL re-training, functional sitting balance, transfers, and general activity tolerance. Pt in supine upon arrival with RN present administering AM meds including pain meds for R residual limb. Pt agreeable to tx session. He transferred to EOB with mod A using hospital bed functions and VCs for sequencing/technique. Pt able to maintain static sitting balance with supervision and dynamic sitting balance with min-mod A this session, a great improvement from previous session. He dressed LB seated EOB, able to bring L LE into modified figure four position with min A to maintain position while pt donned lotion, threaded pants and donned sock. He stood from elevated EOB with mod A with +2 for [employer]musketeer style stand while total A provided for clothing management. Pt demonstrates increased R knee control in standing this session compared to previous sessions.  He completed sliding board transfer to w/c. Elevated bed for downhill transfer and with cuing for head/hip relationship, hand placement, and technique with min A and significantly increased time and rest break during transfer. He completed grooming tasks from w/c level at sink with set-up/supervision.  He self propeled w/c ~7530ft with supervision, improved coordination of R/L UE and ability to  use L LE to assist. In therapy gym, completed mod A slide board transfer onto even level mat. Attempted used of STEDY from highly elevated EOM., despite +2 max A, pt unable to clear buttock enough for flaps to be placed. Was able to on 4th trial, however difficulty maintaining balance while seated on STEDY and required total A to stand from perched position in order to return to mat.  During seated rest breaks, completed x10 knee raises and x10 LARQ with towel placed under foot. Rest breaks and VCs required throughout. He returned ot w/c via sliding board in same manner as described above. Pt taken back to room total A in w/c.  Pt left seated in w/c with all needs in reach, chair belt alarm donned.   Session Two: Pt seen for OT session focusing on functional activity tolerance and general strengthening. Pt sitting up in w/c upon arrival, hwoever, reports needing to go back to bed 2/2 incontinent BM.  Mod A sliding board transfer to return to bed. Bed mobility completed with min A and cuing for sequencing/technique. Total A for hygiene and donning of new brief. Pt cont with diarrhea while in side-lying. RN made aware of events.  He transferred back to sitting EOB and completed sit>stand with +2 assist, 3 musketeer style.  Min A sliding board transfer from elevated EOB with max cuing for head/hip relationship and increased time. He self propelled w/c throughout unit using B UEs, L foot rest in place, completed with supervision.  He completed sliding board transfer to therapy mat. Completed UE strengthening exercise using #1 dowel rod to hit ball  back and forth with therapist. Dynamic sitting balance with supervision. Rest breaks required throughout.  He then completed core strengthening exercise, reclining back on wedged pillow and coming into upright sitting position. Required min-mod A to come upright 2/2 weak core muscle and motor planning deficits. Pt returned to w/c at end of session. RN requesting  pt return to supine following session. Mod A slide board transfer back to bed. Pt left in supine with all needs in reach and bed alarm on.     Therapy Documentation Precautions:  Precautions Precautions: Fall Restrictions Weight Bearing Restrictions: Yes RLE Weight Bearing: Non weight bearing Pain:   No/denies pain   Therapy/Group: Individual Therapy  Cate Oravec L 11/21/2018, 6:45 AM

## 2018-11-21 NOTE — Progress Notes (Signed)
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 Also hx of PVD, underwent right AKA but subsequently admitted for right leg pain concerning for infection and started on IV abtx (12/11-12/18) in mid December. He was then transferred to acute rehab but started to have diffuse watery stools today, though, he denies abdominal discomfort and does not have a leukocytosis.Technically he met the 60 day cure rate after FMT, however, he had to be hospitalized for potential infection of skin/soft tissue/osteo where he was re-exposed to abtx.   Given he is high risk for relapse for cdifficile after he has received recent iv abtx, we will  Plan to start [place] to test for cdifficile (if positive this will be HAI unfortunately)  If positive, plan to treat with dificid 226m BID x 10d  We will see back in the ID clinic in 7-10d

## 2018-11-21 NOTE — Progress Notes (Addendum)
Physical Therapy Session Note  Patient Details  Name: Jeremiah [name] MRN: [id number] Date of Birth: 16-May-1946  Today's Date: 11/21/2018 PT Individual Time: 1440-1520 PT Individual Time Calculation (min): 40 min   Short Term Goals: Week 1:  PT Short Term Goal 1 (Week 1): Pt will complete bed mobility with mod A PT Short Term Goal 2 (Week 1): Pt will maintain sitting balance EOB with SBA consistently PT Short Term Goal 3 (Week 1): Pt will propel manual w/c x 100 ft with min A PT Short Term Goal 4 (Week 1): Pt will initiate standing as safe and able  Skilled Therapeutic Interventions/Progress Updates:   Pt resting in bed.  Son present; on the phone with Dr. Allena KatzPatel regarding pt's diarrhea.  Supine exs: 10 x 2 L pelvic tilts (attempting bridging), bil adductor squeezes, L ankle pumps with resistance for PF, active assistive L straight leg raises.  PT donned shrinker sock at end of session.  Pt rolled L><R in flat bed, for PT to don shrinker sock including waist strap.   Pt stated that it felt much better than ACE because it is snugger.    Pt tends to keep L hip in external rotation with L knee flexed; PT educated pt on neutral positioning of LLE to improve mechanics for sit> stand.   Pt reported RLe phantom sensation; PT educated pt about it and reassured him that it is normal at this stage.  Pt left resting in bed with needs at hand and bed alarm set. Son had departed.    Therapy Documentation Precautions:  Precautions Precautions: Fall Restrictions Weight Bearing Restrictions: Yes RLE Weight Bearing: Non weight bearing  Pain: Pain Assessment Pain Scale: 0-10 Pain Score: 0 premedicated    Therapy/Group: Individual Therapy  Emile Kyllo 11/21/2018, 4:31 PM

## 2018-11-21 NOTE — Progress Notes (Signed)
[place] PHYSICAL MEDICINE & REHABILITATION PROGRESS NOTE  Subjective/Complaints: Patient seen laying in bed this morning.  He states he slept fairly overnight due to leg pain.  He notes persistent diarrhea.  Discussed with son on the phone later today who stated patient had received fecal transplant in the last 6 months for C. difficile.  ROS: + Diarrhea.  Denies CP, shortness of breath, nausea, vomiting.  Objective: Vital Signs: Blood pressure (!) 146/60, pulse (!) 55, temperature 98.3 F (36.8 C), temperature source Oral, resp. rate 16, height 5\' 9"  (1.753 m), weight 73.6 kg, SpO2 100 %. No results found. Recent Labs    11/21/18 0647  WBC 10.9*  HGB 7.7*  HCT 23.5*  PLT 285   No results for input(s): NA, K, CL, CO2, GLUCOSE, BUN, CREATININE, CALCIUM in the last 72 hours.  Physical Exam: BP (!) 146/60 (BP Location: Right Arm)   Pulse (!) 55   Temp 98.3 F (36.8 C) (Oral)   Resp 16   Ht 5\' 9"  (1.753 m)   Wt 73.6 kg   SpO2 100%   BMI 23.96 kg/m  Constitutional: No distress . Vital signs reviewed. HENT: Normocephalic.  Atraumatic. Eyes: EOMI. No discharge. Cardiovascular: RRR.  No JVD. Respiratory: CTA bilaterally.  Normal effort. GI: BS +. Non-distended. Musc: Right AKA Neurological: He is alert.  Some delay in processing, unchanged.  Follows commands. Motor: Bilateral upper extremities: 4/5 proximal distal, unchanged Right lower extremity: Hip flexion 4+/5, unchanged Left lower extremity: Hip flexion 3/5, knee extension 4-/5, ankle dorsiflexion 4/5, stable  Skin: Right AKA with dressing C/D/I Psychiatric: His affect is blunt. His speech is delayed. He is slowed.   Assessment/Plan: 1. 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  Physiatrist is providing close team supervision and 24 hour management of active medical problems listed below.  Physiatrist and  rehab team continue to assess barriers to discharge/monitor patient progress toward functional and medical goals  Care Tool:  Bathing  Bathing activity did not occur: Refused           Bathing assist       Upper Body Dressing/Undressing Upper body dressing   What is the patient wearing?: Pull over shirt    Upper body assist Assist Level: Minimal Assistance - Patient > 75%    Lower Body Dressing/Undressing Lower body dressing      What is the patient wearing?: Pants, Ace wrap/stump shrinker     Lower body assist Assist for lower body dressing: 2 Helpers     Toileting Toileting    Toileting assist Assist for toileting: Moderate Assistance - Patient 50 - 74%     Transfers Chair/bed transfer  Transfers assist  Chair/bed transfer activity did not occur: N/A  Chair/bed transfer assist level: Minimal Assistance - Patient > 75%     Locomotion Ambulation   Ambulation assist   Ambulation activity did not occur: Safety/medical concerns          Walk 10 feet activity   Assist  Walk 10 feet activity did not occur: Safety/medical concerns        Walk 50 feet activity   Assist Walk 50 feet with 2 turns activity did not occur: Safety/medical concerns         Walk 150 feet activity   Assist Walk 150 feet activity did not occur: Safety/medical concerns         Walk 10 feet on  uneven surface  activity   Assist Walk 10 feet on uneven surfaces activity did not occur: Safety/medical concerns         Wheelchair     Assist Will patient use wheelchair at discharge?: Yes Type of Wheelchair: Manual    Wheelchair assist level: Supervision/Verbal cueing Max wheelchair distance: 150    Wheelchair 50 feet with 2 turns activity    Assist        Assist Level: Supervision/Verbal cueing   Wheelchair 150 feet activity     Assist Wheelchair 150 feet activity did not occur: Safety/medical concerns   Assist Level:  Supervision/Verbal cueing      Medical Problem List and Plan: 1.  Decreased functional mobility secondary to right AKA 11/09/2018  Continue CIR   Stump shrinker 2.  DVT Prophylaxis/Anticoagulation: Subcutaneous heparin. Monitor for any bleeding episodes 3. Pain Management:  Percocet as needed 4. Mood:  Provide emotional support 5. Neuropsych: This patient is not fully capable of making decisions on his own behalf. 6. Skin/Wound Care:  Routine skin checks 7. Fluids/Electrolytes/Nutrition/hypokalemia:  Routine ins and outs   BMP within acceptable range on 12/24  Labs ordered for tomorrow 8. Acute blood loss anemia. Continue iron supplement  Hemoglobin 8.5 on 12/24  Labs ordered for tomorrow  Continue to monitor 9. Hypertension.Norvasc 10 mg daily, Cozaar 100 mg daily, Toprol-XL 50 mg daily.   Labile on 12/29 10. Diabetes mellitus with peripheral neuropathy. Hemoglobin A1c 6.7.SSI. Patient on Amaryl 4 mg twice a day prior to admission. Resume as needed  Labile on 12/29 11. Hyperlipidemia. Zocor 12.  Hypoalbuminemia  Supplement initiated on 12/25 13.  Leukocytosis  Afebrile  WBCs 10.9 on 12/29  Labs ordered for tomorrow  Continue to monitor 14. Diarrhea  Fiber added on 12/26, increased on 12/28  Will discuss with pharmacy if medication related -iron DC'd  Probiotic started on 12/28  Will consult ID given history of C Diff  LOS: 6 days A FACE TO FACE EVALUATION WAS PERFORMED  Ankit Karis Jubanil Patel 11/21/2018, 2:51 PM

## 2018-11-21 NOTE — Progress Notes (Signed)
Paged Dr Allena KatzPatel regarding multiple BMs this shift. Bowel is loose and mucus like. Family concerned about what is going to be done. Family has hx of C. Diff in the past. Allena KatzPatel contacted nurse back and spoke with pt's son. Son said Allena Katzatel was going to contact stomach Dr. Stann MainlandWill cont to monitor.  Jeremiah LudwigKAYLA M Antavion Bartoszek, LPN

## 2018-11-22 ENCOUNTER — Inpatient Hospital Stay (HOSPITAL_COMMUNITY): Payer: Medicare Other | Admitting: Occupational Therapy

## 2018-11-22 ENCOUNTER — Inpatient Hospital Stay (HOSPITAL_COMMUNITY): Payer: Medicare Other | Admitting: Physical Therapy

## 2018-11-22 DIAGNOSIS — A0472 Enterocolitis due to Clostridium difficile, not specified as recurrent: Secondary | ICD-10-CM

## 2018-11-22 LAB — CBC WITH DIFFERENTIAL/PLATELET
Abs Immature Granulocytes: 0.03 10*3/uL (ref 0.00–0.07)
Basophils Absolute: 0 10*3/uL (ref 0.0–0.1)
Basophils Relative: 0 %
Eosinophils Absolute: 0.7 10*3/uL — ABNORMAL HIGH (ref 0.0–0.5)
Eosinophils Relative: 5 %
HCT: 24.8 % — ABNORMAL LOW (ref 39.0–52.0)
Hemoglobin: 8.2 g/dL — ABNORMAL LOW (ref 13.0–17.0)
Immature Granulocytes: 0 %
Lymphocytes Relative: 23 %
Lymphs Abs: 2.8 10*3/uL (ref 0.7–4.0)
MCH: 27.9 pg (ref 26.0–34.0)
MCHC: 33.1 g/dL (ref 30.0–36.0)
MCV: 84.4 fL (ref 80.0–100.0)
Monocytes Absolute: 1.1 10*3/uL — ABNORMAL HIGH (ref 0.1–1.0)
Monocytes Relative: 9 %
Neutro Abs: 7.6 10*3/uL (ref 1.7–7.7)
Neutrophils Relative %: 63 %
Platelets: 268 10*3/uL (ref 150–400)
RBC: 2.94 MIL/uL — ABNORMAL LOW (ref 4.22–5.81)
RDW: 14.7 % (ref 11.5–15.5)
WBC: 12.2 10*3/uL — ABNORMAL HIGH (ref 4.0–10.5)
nRBC: 0 % (ref 0.0–0.2)

## 2018-11-22 LAB — BASIC METABOLIC PANEL
Anion gap: 9 (ref 5–15)
BUN: 9 mg/dL (ref 8–23)
CO2: 23 mmol/L (ref 22–32)
Calcium: 8.4 mg/dL — ABNORMAL LOW (ref 8.9–10.3)
Chloride: 103 mmol/L (ref 98–111)
Creatinine, Ser: 1.11 mg/dL (ref 0.61–1.24)
GFR calc Af Amer: >60 mL/min (ref >60)
GFR calc non Af Amer: >60 mL/min (ref >60)
Glucose, Bld: 188 mg/dL — ABNORMAL HIGH (ref 70–99)
Potassium: 3.7 mmol/L (ref 3.5–5.1)
Sodium: 135 mmol/L (ref 135–145)

## 2018-11-22 LAB — GLUCOSE, CAPILLARY
Glucose-Capillary: 130 mg/dL — ABNORMAL HIGH (ref 70–99)
Glucose-Capillary: 144 mg/dL — ABNORMAL HIGH (ref 70–99)
Glucose-Capillary: 147 mg/dL — ABNORMAL HIGH (ref 70–99)
Glucose-Capillary: 158 mg/dL — ABNORMAL HIGH (ref 70–99)

## 2018-11-22 LAB — C DIFFICILE QUICK SCREEN W PCR REFLEX
C Diff antigen: POSITIVE — AB
C Diff interpretation: DETECTED
C Diff toxin: POSITIVE — AB

## 2018-11-22 MED ORDER — METOPROLOL SUCCINATE ER 25 MG PO TB24
25.0000 mg | ORAL_TABLET | Freq: Every day | ORAL | Status: DC
  Administered 2018-11-23 – 2018-12-07 (×15): 25 mg via ORAL
  Filled 2018-11-22 (×15): qty 1

## 2018-11-22 NOTE — Progress Notes (Signed)
Physical Therapy Session Note  Patient Details  Name: Jeremiah [name] MRN: [id number] Date of Birth: Jul 21, 1946  Today's Date: 11/22/2018 PT Individual Time: 0940-1040 PT Individual Time Calculation (min): 60 min   Short Term Goals: Week 1:  PT Short Term Goal 1 (Week 1): Pt will complete bed mobility with mod A PT Short Term Goal 2 (Week 1): Pt will maintain sitting balance EOB with SBA consistently PT Short Term Goal 3 (Week 1): Pt will propel manual w/c x 100 ft with min A PT Short Term Goal 4 (Week 1): Pt will initiate standing as safe and able  Skilled Therapeutic Interventions/Progress Updates: Pt received seated in bed, denies pain and agreeable to treatment. Donned pants totalA with minA rolling R/L. Supine>sit modA for trunk control. Lateral scoot bed>w/c with transfer board and modA. Hand hygiene at sink with modI, increased time. W/c propulsion to gym BUE for strengthening and endurance; slow speed and min cues for technique for efficiency. W/c <>mat table with transfer board and modA with cues for head/hips relationship and push through LE on floor. Sit <>stand from elevated mat table with stedy; unable to come to full stand with +2A. Trial sit <>stand three muskateers x3 reps; cues for hip/trunk extension at end range for full standing position. Translated sit >stand back to stedy with success; able to sit>stand from mat table modA +2, from stedy seat min/modA +1 x5 reps. Returned to mat table for rest break; pt then unable to perform sit >stand as before, again lacking hip/trunk extension at end range. Transferred back to w/c as above with modA overall. W/c propulsion x75' BUE, totalA remaining distance for time management. Remained seated in w/c at end of session, lap belt alarm intact and all needs in reach.      Therapy Documentation Precautions:  Precautions Precautions: Fall Restrictions Weight Bearing Restrictions: Yes RLE Weight Bearing: Non weight  bearing    Therapy/Group: Individual Therapy  Harlon Dittylizabeth J Seigle 11/22/2018, 10:45 AM

## 2018-11-22 NOTE — Progress Notes (Signed)
Occupational Therapy Session Note  Patient Details  Name: Jeremiah [name] MRN: [id number] Date of Birth: 05-Apr-1946  Today's Date: 11/22/2018 OT Individual Time: [phone] and 1400-1500 OT Individual Time Calculation (min): 60 min and 60 min OT Missed Time: 15 min   Short Term Goals: Week 1:  OT Short Term Goal 1 (Week 1): Pt will complete basic transfer with +1 assist in order to decrease caregiver burden OT Short Term Goal 2 (Week 1): Pt will complete sit>stand at sink with +1 assist in prep for ADL task OT Short Term Goal 3 (Week 1): Pt will don pants at bed level with min A  Skilled Therapeutic Interventions/Progress Updates:    Session One: PT seen for OT ADL bathing/dressing session. Pt sitting upright in bed upon arrival eating breakfast. Reports nursing staff just cleaned him following incontinent loose stool at bed level. Pt reports unrated pain in residual limb. RN made aware and pt repositioned for comfort.  He transferred to sitting EOB from upright bed with significantly increased time and effort with VCs for technique.  Seated EOB, he completed UB bathing/dressing with supervision/set-up assist, maintaining dynamic dynamic sitting balance with supervision. He washed LE with min A for sitting balance and maintaining modified figure four sitting position in orderto reach foot to wash, doff socks, and thread pants. Upon preparing to stand to pull pants up, pt reports activey having loose stool. Returned to supine, total A for hygiene, bed linenens changed while in side-lying. Pt with another incontinent liquid stool episode while therapist and NT provided assist. Hygiene performed again and new brief donned. Pt left in supine with brief donned, no pants as pt having hourly incontinent stool episodes. MD and RN aware. Pt missed 15 minutes of OT session as a result.   Session Two: Pt seen for OT session focusing on sit>stand, functional standing balance and LE strengthening. Pt sitting  up in w/c upon arrival, voiced feeling better and agreeable to tx session. He self propelled w/c to therapy gym with supervision for UE strengthening. Completed mod A sliding board transfer to EOM. Seated EOM, completed sit>stand from highly elevated EOM to heavy duty standard walker. Completed x4 trials with seated rest break btwn trial. Pt initially requiring mod A +2 to power into standing, progressing to +1 mod A. Pt tolerating ~5-10 seconds each stand before requiring seated rest break.  He then returned to supine on mat. Completed R LE strengthening exercises in supine, x10 leg lifts and x10 ABduction. Placed in prone position with max A. Upon positioned in prone, pt voiced needing to urinate, unable to get pt positioned and provide with urinal in time, therefore pt incontinent in brief.  Returned to sitting EOM and completed squat pivot transfer EOM> w/c and w/c> EOB with +2 via squat pivot. Pt rolled in bed with supervision for new brief to be donned. Pt left in supine to rest at end of session, all needs in reach and bed alarm on.    Therapy Documentation Precautions:  Precautions Precautions: Fall Restrictions Weight Bearing Restrictions: Yes RLE Weight Bearing: Non weight bearing    Therapy/Group: Individual Therapy  Kameko Hukill L 11/22/2018, 7:07 AM

## 2018-11-22 NOTE — Progress Notes (Signed)
[place] PHYSICAL MEDICINE & REHABILITATION PROGRESS NOTE  Subjective/Complaints: Patient seen laying in bed this morning.  Continues to have diarrhea limiting therapies, discussed with therapies.  He states he slept fairly overnight.  ROS: + Diarrhea.  Denies CP, shortness of breath, nausea, vomiting.  Objective: Vital Signs: Blood pressure (!) 132/50, pulse (!) 55, temperature 98.5 F (36.9 C), temperature source Oral, resp. rate 16, height 5\' 9"  (1.753 m), weight 73.6 kg, SpO2 100 %. No results found. Recent Labs    11/21/18 0647  WBC 10.9*  HGB 7.7*  HCT 23.5*  PLT 285   No results for input(s): NA, K, CL, CO2, GLUCOSE, BUN, CREATININE, CALCIUM in the last 72 hours.  Physical Exam: BP (!) 132/50 (BP Location: Left Arm)   Pulse (!) 55   Temp 98.5 F (36.9 C) (Oral)   Resp 16   Ht 5\' 9"  (1.753 m)   Wt 73.6 kg   SpO2 100%   BMI 23.96 kg/m  Constitutional: No distress . Vital signs reviewed. HENT: Normocephalic.  Atraumatic. Eyes: EOMI. No discharge. Cardiovascular: RRR.  No JVD. Respiratory: CTA bilaterally.  Normal effort. GI: BS +. Non-distended. Musc: Right AKA Neurological: He is alert.  Some delay in processing, unchanged.  Follows commands. Motor: Bilateral upper extremities: 4/5 proximal distal, unchanged Right lower extremity: Hip flexion 4+/5, unchanged Left lower extremity: Hip flexion 3/5, knee extension 4-/5, ankle dorsiflexion 4/5, stable  Skin: Right AKA with C/D/I Psychiatric: His affect is blunt. His speech is delayed. He is slowed.   Assessment/Plan: 1. 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  Physiatrist is providing close team supervision and 24 hour management of active medical problems listed below.  Physiatrist and rehab team continue to assess barriers to discharge/monitor patient progress toward functional and medical  goals  Care Tool:  Bathing  Bathing activity did not occur: Refused           Bathing assist       Upper Body Dressing/Undressing Upper body dressing   What is the patient wearing?: Pull over shirt    Upper body assist Assist Level: Minimal Assistance - Patient > 75%    Lower Body Dressing/Undressing Lower body dressing      What is the patient wearing?: Pants, Ace wrap/stump shrinker     Lower body assist Assist for lower body dressing: 2 Helpers     Toileting Toileting    Toileting assist Assist for toileting: Moderate Assistance - Patient 50 - 74%     Transfers Chair/bed transfer  Transfers assist  Chair/bed transfer activity did not occur: N/A  Chair/bed transfer assist level: Minimal Assistance - Patient > 75%     Locomotion Ambulation   Ambulation assist   Ambulation activity did not occur: Safety/medical concerns          Walk 10 feet activity   Assist  Walk 10 feet activity did not occur: Safety/medical concerns        Walk 50 feet activity   Assist Walk 50 feet with 2 turns activity did not occur: Safety/medical concerns         Walk 150 feet activity   Assist Walk 150 feet activity did not occur: Safety/medical concerns         Walk 10 feet on uneven surface  activity   Assist Walk 10 feet on uneven surfaces activity did not occur: Safety/medical concerns  Wheelchair     Assist Will patient use wheelchair at discharge?: Yes Type of Wheelchair: Manual    Wheelchair assist level: Supervision/Verbal cueing Max wheelchair distance: 150    Wheelchair 50 feet with 2 turns activity    Assist        Assist Level: Supervision/Verbal cueing   Wheelchair 150 feet activity     Assist Wheelchair 150 feet activity did not occur: Safety/medical concerns   Assist Level: Supervision/Verbal cueing      Medical Problem List and Plan: 1.  Decreased functional mobility secondary to right  AKA 11/09/2018  Continue CIR   Stump shrinker 2.  DVT Prophylaxis/Anticoagulation: Subcutaneous heparin. Monitor for any bleeding episodes 3. Pain Management:  Percocet as needed 4. Mood:  Provide emotional support 5. Neuropsych: This patient is not fully capable of making decisions on his own behalf. 6. Skin/Wound Care:  Routine skin checks 7. Fluids/Electrolytes/Nutrition/hypokalemia:  Routine ins and outs   BMP within acceptable range on 12/24  Labs pending 8. Acute blood loss anemia. Continue iron supplement  Hemoglobin 8.5 on 12/24  Labs pending  Continue to monitor 9. Hypertension.Norvasc 10 mg daily, Cozaar 100 mg daily, Toprol-XL 50 mg daily.   Relatively controlled on 12/30 10. Diabetes mellitus with peripheral neuropathy. Hemoglobin A1c 6.7.SSI. Patient on Amaryl 4 mg twice a day prior to admission. Resume as needed  Labile on 12/30 11. Hyperlipidemia. Zocor 12.  Hypoalbuminemia  Supplement initiated on 12/25 13.  Leukocytosis  Afebrile  WBCs 10.9 on 12/29  Labs pending  Continue to monitor 14.  C. difficile  Fiber added on 12/26, increased on 12/28  Probiotic started on 12/28  Dificid 200 mg twice daily x10 days started on 12/29  LOS: 7 days A FACE TO FACE EVALUATION WAS PERFORMED  Ankit Karis JubaAnil Patel 11/22/2018, 9:09 AM

## 2018-11-22 NOTE — Progress Notes (Signed)
Social Work Patient ID: Curley SpiceWalter L Freeze, male   DOB: 08/06/1946, 72 y.o.   MRN: [id number] [facility] Will await response regarding coverage for continued stay.

## 2018-11-23 ENCOUNTER — Inpatient Hospital Stay (HOSPITAL_COMMUNITY): Payer: Medicare Other | Admitting: Physical Therapy

## 2018-11-23 ENCOUNTER — Inpatient Hospital Stay (HOSPITAL_COMMUNITY): Payer: Medicare Other

## 2018-11-23 LAB — GLUCOSE, CAPILLARY
Glucose-Capillary: 158 mg/dL — ABNORMAL HIGH (ref 70–99)
Glucose-Capillary: 115 mg/dL — ABNORMAL HIGH (ref 70–99)
Glucose-Capillary: 131 mg/dL — ABNORMAL HIGH (ref 70–99)
Glucose-Capillary: 126 mg/dL — ABNORMAL HIGH (ref 70–99)

## 2018-11-23 MED ORDER — GERHARDT'S BUTT CREAM
TOPICAL_CREAM | Freq: Three times a day (TID) | CUTANEOUS | Status: DC
  Administered 2018-11-23 – 2018-11-24 (×2): 1 via TOPICAL
  Administered 2018-11-24: 21:00:00 via TOPICAL
  Administered 2018-11-25: 1 via TOPICAL
  Administered 2018-11-25 – 2018-11-26 (×3): via TOPICAL
  Administered 2018-11-26: 1 via TOPICAL
  Administered 2018-11-26 – 2018-12-07 (×32): via TOPICAL
  Filled 2018-11-23 (×2): qty 1

## 2018-11-23 NOTE — Progress Notes (Signed)
Occupational Therapy Weekly Progress Note  Patient Details  Name: Jeremiah [name] MRN: [id number] Date of Birth: 03-11-1946  Beginning of progress report period: November 16, 2018 End of progress report period: November 23, 2018   Patient has met 2 of 3 short term goals.  Pt is making steady progress towards OT goals.He has been slightly limited in therapy due to incontinent diarhea episodes, however, this is beginning to resolve and pt able to participate in tx sessions fully. He is completing basic sliding board transfers at overall mod A. He requires +2 assist for sit>stand at heavy duty walker, however, stand not yet functional at this time as pt unable to tolerate standing longer than ~5-10 seconds. He cont to be most limited by decreased functional activity tolerance as well as motor planning deficits. Pt remains very motivated in tx sessions and desires to increase functional independence and mobility.   Patient continues to demonstrate the following deficits: muscle weakness, decreased cardiorespiratoy endurance and decreased sitting balance, decreased standing balance, decreased postural control and decreased balance strategies and therefore will continue to benefit from skilled OT intervention to enhance overall performance with BADL and Reduce care partner burden.  Patient progressing toward long term goals..  Continue plan of care.  OT Short Term Goals Week 1:  OT Short Term Goal 1 (Week 1): Pt will complete basic transfer with +1 assist in order to decrease caregiver burden OT Short Term Goal 1 - Progress (Week 1): Met OT Short Term Goal 2 (Week 1): Pt will complete sit>stand at sink with +1 assist in prep for ADL task OT Short Term Goal 2 - Progress (Week 1): Progressing toward goal OT Short Term Goal 3 (Week 1): Pt will don pants at bed level with min A OT Short Term Goal 3 - Progress (Week 1): Met Week 2:  OT Short Term Goal 1 (Week 2): Pt will complete sit>stand at RW during LB  dressing taask with mod A+1 OT Short Term Goal 2 (Week 2): Pt will complete toilet/BSC transfer with mod A OT Short Term Goal 3 (Week 2): Pt will complete toileting task with max A +1 in order to reduce caregiver burden   Therapy Documentation Precautions:  Precautions Precautions: Fall Restrictions Weight Bearing Restrictions: Yes RLE Weight Bearing: Non weight bearing   Kalima Saylor L 11/23/2018, 5:22 PM

## 2018-11-23 NOTE — Progress Notes (Signed)
[facility] PHYSICAL MEDICINE & REHABILITATION PROGRESS NOTE  Subjective/Complaints: Patient seen sitting up in bed this morning eating breakfast.  He states he slept well overnight.  He notes resolution of diarrhea.  ROS: Denies CP, shortness of breath, nausea, vomiting.  Objective: Vital Signs: Blood pressure (!) 147/61, pulse (!) 59, temperature 98.5 F (36.9 C), temperature source Oral, resp. rate 16, height 5\' 9"  (1.753 m), weight 73.6 kg, SpO2 100 %. No results found. Recent Labs    11/21/18 0647 11/22/18 0926  WBC 10.9* 12.2*  HGB 7.7* 8.2*  HCT 23.5* 24.8*  PLT 285 268   Recent Labs    11/22/18 0926  NA 135  K 3.7  CL 103  CO2 23  GLUCOSE 188*  BUN 9  CREATININE 1.11  CALCIUM 8.4*    Physical Exam: BP (!) 147/61 (BP Location: Right Arm)   Pulse (!) 59   Temp 98.5 F (36.9 C) (Oral)   Resp 16   Ht 5\' 9"  (1.753 m)   Wt 73.6 kg   SpO2 100%   BMI 23.96 kg/m  Constitutional: No distress . Vital signs reviewed. HENT: Normocephalic.  Atraumatic. Eyes: EOMI. No discharge. Cardiovascular: RRR.  No JVD. Respiratory: CTA bilaterally.  Normal effort. GI: BS +. Non-distended. Musc: Right AKA Neurological: He is alert.  Some delay in processing, stable.  Follows commands. Motor: Bilateral upper extremities: 4/5 proximal distal, stable Right lower extremity: Hip flexion 4+/5, stable Left lower extremity: Hip flexion 3/5, knee extension 4-/5, ankle dorsiflexion 4/5, stable  Skin: Right AKA with dressing C/D/I Psychiatric: His affect is blunt. His speech is delayed. He is slowed.   Assessment/Plan: 1. 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  Physiatrist is providing close team supervision and 24 hour management of active medical problems listed below.  Physiatrist and rehab team continue to assess barriers to discharge/monitor patient progress toward  functional and medical goals  Care Tool:  Bathing  Bathing activity did not occur: Refused           Bathing assist       Upper Body Dressing/Undressing Upper body dressing   What is the patient wearing?: Pull over shirt    Upper body assist Assist Level: Minimal Assistance - Patient > 75%    Lower Body Dressing/Undressing Lower body dressing      What is the patient wearing?: Pants, Ace wrap/stump shrinker     Lower body assist Assist for lower body dressing: 2 Helpers     Toileting Toileting    Toileting assist Assist for toileting: Moderate Assistance - Patient 50 - 74%     Transfers Chair/bed transfer  Transfers assist  Chair/bed transfer activity did not occur: N/A  Chair/bed transfer assist level: Moderate Assistance - Patient 50 - 74%     Locomotion Ambulation   Ambulation assist   Ambulation activity did not occur: Safety/medical concerns          Walk 10 feet activity   Assist  Walk 10 feet activity did not occur: Safety/medical concerns        Walk 50 feet activity   Assist Walk 50 feet with 2 turns activity did not occur: Safety/medical concerns         Walk 150 feet activity   Assist Walk 150 feet activity did not occur: Safety/medical concerns         Walk 10 feet on uneven surface  activity  Assist Walk 10 feet on uneven surfaces activity did not occur: Safety/medical concerns         Wheelchair     Assist Will patient use wheelchair at discharge?: Yes Type of Wheelchair: Manual    Wheelchair assist level: Supervision/Verbal cueing Max wheelchair distance: 150    Wheelchair 50 feet with 2 turns activity    Assist        Assist Level: Supervision/Verbal cueing   Wheelchair 150 feet activity     Assist Wheelchair 150 feet activity did not occur: Safety/medical concerns   Assist Level: Supervision/Verbal cueing      Medical Problem List and Plan: 1.  Decreased functional  mobility secondary to right AKA 11/09/2018  Continue CIR   Stump shrinker 2.  DVT Prophylaxis/Anticoagulation: Subcutaneous heparin. Monitor for any bleeding episodes 3. Pain Management:  Percocet as needed 4. Mood:  Provide emotional support 5. Neuropsych: This patient is not fully capable of making decisions on his own behalf. 6. Skin/Wound Care:  Routine skin checks 7. Fluids/Electrolytes/Nutrition/hypokalemia:  Routine ins and outs   BMP within acceptable range on 12/30 8. Acute blood loss anemia. Continue iron supplement  Hemoglobin 8.2 on 12/30  Continue to monitor 9. Hypertension.Norvasc 10 mg daily, Cozaar 100 mg daily, Toprol-XL 50 mg daily.   Labile on 12/31 10. Diabetes mellitus with peripheral neuropathy. Hemoglobin A1c 6.7.SSI. Patient on Amaryl 4 mg twice a day prior to admission. Resume as needed  Elevated on 12/31, will consider restarting medications tomorrow if persistently elevated 11. Hyperlipidemia. Zocor 12.  Hypoalbuminemia  Supplement initiated on 12/25 13.  Leukocytosis  Afebrile  WBCs 12.2 on 12/30  Continue to monitor 14.  C. difficile  Fiber added on 12/26, increased on 12/28  Probiotic started on 12/28  Dificid 200 mg twice daily x10 days started on 12/29  LOS: 8 days A FACE TO FACE EVALUATION WAS PERFORMED  Amal Saiki Karis Jubanil Doss Cybulski 11/23/2018, 8:29 AM

## 2018-11-23 NOTE — Progress Notes (Signed)
Occupational Therapy Session Note  Patient Details  Name: Jeremiah [name] MRN: [id number] Date of Birth: May 22, 1946  Today's Date: 11/23/2018 OT Individual Time: 0850-1000 OT Individual Time Calculation (min): 70 min    Short Term Goals: Week 1:  OT Short Term Goal 1 (Week 1): Pt will complete basic transfer with +1 assist in order to decrease caregiver burden OT Short Term Goal 2 (Week 1): Pt will complete sit>stand at sink with +1 assist in prep for ADL task OT Short Term Goal 3 (Week 1): Pt will don pants at bed level with min A  Skilled Therapeutic Interventions/Progress Updates:    Session focused on b/d tasks EOB and ADL transfers. No c/o pain throughout session. Pt incontient of bowel upon entering room, total A for clean up at bed level. Pt rolling R and L with min A. Pt transitioned supine to EOB with HOB slightly elevated and use of bed rails, mod A. Pt completed UB bathing and dressing with set up. Donned liner to R LE with max A, good caregiver instructions by pt. Pt able to thread B LE through pants and to stand from EOB with RW with mod +2. Pt completed stand pivot transfer to w/c with 2 attempts, max +2. Heavy cues for sequencing LLE placement and positioning. Pt attempted to complete sit <> stand from w/c, but was unable to achieve upright posture with max +2. Pt completed squat pivot to therapy mat with max +2. While sitting EOM pt engaged in core stabilization exercises with heavy verbal cues for sequencing. Pt was returned to room and left sitting up in w/c with chair alarm belt fastened and all needs met.   Therapy Documentation Precautions:  Precautions Precautions: Fall Restrictions Weight Bearing Restrictions: Yes RLE Weight Bearing: Non weight bearing Pain: Pain Assessment Pain Scale: 0-10 Pain Score: 0-No pain   Therapy/Group: Individual Therapy  Curtis Sites 11/23/2018, 10:21 AM

## 2018-11-23 NOTE — Progress Notes (Signed)
Social Work Patient ID: Curley SpiceWalter L Gaetz, male   DOB: 02-04-1946, 72 y.o.   MRN: [id number] Spoke with wife and pt regarding team conference goals min assist level and target discharge date 1/14. Wife can do a lot for pt and was prior to admission but she will need to find someone to stay with him while she is at HD M,W,F. She will start working on this now. They had [facility] will resume their services and work on his discharge needs. Wife will plan to come in the Thursday before he goes home to do family education. Will work toward discharge date.

## 2018-11-23 NOTE — Patient Care Conference (Signed)
Inpatient RehabilitationTeam Conference and Plan of Care Update Date: 11/25/2018   Time: 8:40 AM    Patient Name: Jeremiah [name]      Medical Record Number: [id number]  Date of Birth: 16-Jul-1946 Sex: Male         Room/Bed: 4M11C/4M11C-01 Payor Info: Payor: [employer]ICARE / Plan: [employer] MEDICARE / Product Type: *No Product type* /    Admitting Diagnosis: R AKA  Admit Date/Time:  11/15/2018  6:04 PM Admission Comments: No comment available   Primary Diagnosis:  <principal problem not specified> Principal Problem: <principal problem not specified>  Patient Active Problem List   Diagnosis Date Noted  . Labile blood glucose   . Enteritis due to Clostridium difficile   . Blood glucose abnormal   . Diarrhea   . Benign essential HTN   . Hypoalbuminemia due to protein-calorie malnutrition (HCC)   . Labile blood pressure   . 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 Postoperative pain   . Acute blood loss anemia   . Dyslipidemia   . Type 2 diabetes mellitus with peripheral neuropathy (HCC)   . S/P AKA (above knee amputation) (HCC) 11/12/2018  . Unilateral AKA, right (HCC)   . Hypertensive crisis   . Diabetes mellitus type 2 in nonobese (HCC)   . Pleural effusion 11/08/2018  . Fever   . Open wound of right foot   . Sepsis (HCC) 11/03/2018  . Altered mental status   . Acute cystitis without hematuria   . Leukocytosis   . Pressure injury of skin 07/08/2018  . Clostridium difficile colitis 07/07/2018  . Hypokalemia 07/07/2018  . HLD (hyperlipidemia) 07/07/2018  . Acute diverticulitis 06/04/2018  . Cholecystitis 05/24/2018  . Cholecystitis with cholelithiasis 05/21/2018  . Acute systolic heart failure (HCC) 03/27/2018  . Diabetes mellitus without complication (HCC) 01/30/2018  . Hypertension 01/30/2018  . Acute cholecystitis 01/30/2018  . AKI (acute kidney injury) (HCC) 01/30/2018  . Normocytic anemia 01/30/2018  . PVD (peripheral vascular disease) (HCC) 09/05/2014     Expected Discharge Date: Expected Discharge Date: 12/07/18  Team Members Present: Physician leading conference: Dr. Maryla MorrowAnkit Patel Social Worker Present: Dossie DerBecky Colsen Modi, LCSW Nurse Present: Ronny BaconWhitney Reardon, RN PT Present: Maren ReamerElizabeth Seigle, PT OT Present: Roel Cluck(Sandra Davis-OT) SLP Present: Colin BentonMadison Cratch, SLP PPS Coordinator present : Fae PippinMelissa Bowie     Current Status/Progress Goal Weekly Team Focus  Medical   Decreased functional mobility secondary to right AKA 11/09/2018  Improve mobility, HTN/DM, ABLA, C. Diff  See above   Bowel/Bladder             Swallow/Nutrition/ Hydration             ADL's   Min-mod A basic sliding board transfers, +2 LB bathing/dressing sit>stand, total A toileting at bed level 2/2 incontinence, supervision-min UB bathing/dressing  Min A overall  Functional mobility and transfers, ADL re-training, activity tolerance, general strength and conditioning   Mobility   modA bed mobility, maxA transfers, S w/c propulsion  S bed mobility, minA transfers, S w/c propulsion  activity tolerance, transfer training, strengthening   Communication             Safety/Cognition/ Behavioral Observations            Pain             Skin                *See Care Plan and progress notes for long and short-term goals.     Barriers to  Discharge  Current Status/Progress Possible Resolutions Date Resolved   Physician    Medical stability;Wound Care;Weight bearing restrictions;Other (comments)  C. Diff  See above  Therapies, optimize DM/HTN meds, follow labs, ID recs for C. DIff      Nursing                  PT                    OT                  SLP                SW                Discharge Planning/Teaching Needs:  Home with wife who can provide some physical assist she is a HD pt herself but does well with this, drives self to HD      Team Discussion:  Goals min assist level, currently mod-max level of assist. Bowel issues being treated again for  reoccurrent C-Diff. BP up and down MD monitoring Also monitoring his CBG's. Skin issues due to all of his loose stools. Did stand with plus 2 today. Medical issues have limited his progress in therapies.  Revisions to Treatment Plan:  DC 1/14    Continued Need for Acute Rehabilitation Level of Care: The patient requires daily medical management by a physician with specialized training in physical medicine and rehabilitation for the following conditions: Daily direction of a multidisciplinary physical rehabilitation program to ensure safe treatment while eliciting the highest outcome that is of practical value to the patient.: Yes Daily medical management of patient stability for increased activity during participation in an intensive rehabilitation regime.: Yes Daily analysis of laboratory values and/or radiology reports with any subsequent need for medication adjustment of medical intervention for : Post surgical problems;Diabetes problems;Wound care problems;Blood pressure problems;Other   I attest that I was present, lead the team conference, and concur with the assessment and plan of the team.   Lucy Chrisupree, Yanissa Michalsky G 11/25/2018, 8:40 AM

## 2018-11-23 NOTE — Progress Notes (Signed)
Physical Therapy Weekly Progress Note  Patient Details  Name: Jeremiah [name] MRN: [id number] Date of Birth: [birth year]  Beginning of progress report period: November 16, 2018 End of progress report period: November 23, 2018  Today's Date: 11/23/2018 PT Individual Time: 1030-1100 and 1300-1400 PT Individual Time Calculation (min): 30 min and 60 min (total 90 min)   Patient has met 4 of 4 short term goals.  Pt currently requires modA for bed mobility, maxA slideboard transfers, maxA +2 sit <>stand with stedy vs RW. Continues to be limited by strength/endurance deficits, generalized deconditioning, and cognitive deficits that limit carryover between sessions.   Patient continues to demonstrate the following deficits muscle weakness, decreased cardiorespiratoy endurance, impaired timing and sequencing, decreased coordination and decreased motor planning, decreased initiation, decreased attention, decreased awareness, decreased problem solving, decreased safety awareness, decreased memory and delayed processing and decreased sitting balance, decreased standing balance, decreased postural control and decreased balance strategies and therefore will continue to benefit from skilled PT intervention to increase functional independence with mobility.  Patient progressing toward long term goals..  Continue plan of care.  PT Short Term Goals Week 1:  PT Short Term Goal 1 (Week 1): Pt will complete bed mobility with mod A PT Short Term Goal 1 - Progress (Week 1): Met PT Short Term Goal 2 (Week 1): Pt will maintain sitting balance EOB with SBA consistently PT Short Term Goal 2 - Progress (Week 1): Met PT Short Term Goal 3 (Week 1): Pt will propel manual w/c x 100 ft with min A PT Short Term Goal 3 - Progress (Week 1): Met PT Short Term Goal 4 (Week 1): Pt will initiate standing as safe and able PT Short Term Goal 4 - Progress (Week 1): Met Week 2:  PT Short Term Goal 1 (Week 2): Pt will perform bed  mobility with minA PT Short Term Goal 2 (Week 2): Pt will perform transfers modA PT Short Term Goal 3 (Week 2): Pt will perform car transfer modA  Skilled Therapeutic Interventions/Progress Updates: Tx 1: Pt received seated in w/c, denies pain and agreeable to treatment. Hand hygiene at sink modI, increased time. W/c propulsion x150' with BUE and S for strengthening and aerobic endurance. BUE ergometer x8 min level 8 for strengthening and aerobic endurance. W/c propulsion navigating around cones for focus on direction changes and obstacle negotiation with S and increased time. Remained seated in w/c at end of session, alarm intact and all needs in reach.   Tx 2: Pt received in w/c, propelling around room to retrieve urinal d/t urgency. Required assist for placing, emptying urinal. Pt's pants noted to have been soiled d/t voiding prior to placing urinal. TotalA for removing RLE residual limb sock, pants, and donning clean clothing using lateral leans and w/c pushups. Hand hygiene at sink with modI. W/c propulsion BUE x100' for strengthening with supervision. Lateral scoot w/c >mat table modA with S initially, faded to modA as pt fatigued towards end of transfer. Sit <>stand from elevated mat table 24" with standard walker and modA, faded to minA x5 reps total. Standing tolerance of approximately 10-15 sec. Mat table progressively lowered toward height of w/c, performing x3 reps at 22", x2 reps at 20". Lateral scoot transfer to return to mat with modA. W/c propulsion x100' towards room; remaining distance totalA d/t fatigue. Remained in w/c, alarm intact and all needs in reach.      Therapy Documentation Precautions:  Precautions Precautions: Fall Restrictions Weight Bearing Restrictions: Yes RLE Weight  Bearing: Non weight bearing Pain: Pain Assessment Pain Scale: 0-10 Pain Score: 0-No pain   Therapy/Group: Individual Therapy  Corliss Skains 11/23/2018, 10:57 AM

## 2018-11-23 NOTE — Progress Notes (Signed)
Physical Therapy Session Note  Patient Details  Name: Jeremiah [name] MRN: [id number] Date of Birth: 04/30/1946  Today's Date: 11/23/2018 PT Individual Time: 1500-1535 PT Individual Time Calculation (min): 35 min   Short Term Goals: Week 2:  PT Short Term Goal 1 (Week 2): Pt will perform bed mobility with minA PT Short Term Goal 2 (Week 2): Pt will perform transfers modA PT Short Term Goal 3 (Week 2): Pt will perform car transfer modA  Skilled Therapeutic Interventions/Progress Updates:    Patient received up in WC, pleasant and willing to participate in session. Able to self-propel WC approximately 150ft with B UEs and S, VC and encouragement provided by therapist and distance further limited by fatigue. Otherwise focused session on head-hips relationship with activities including reaching forward to tap cone on ground with ball with min guard and cues for technique, and partial sit to stand at parallel bars with heavy ModA and max cues for head-hips translation. He was returned to his room totalA in WC due to fatigue, and was able to perform squat pivot transfer to bed with ModAx1/MinAx1, then able to perform bed mobility with MinA. He was left in bed with all needs met, bed alarm active this afternoon.   Therapy Documentation Precautions:  Precautions Precautions: Fall Restrictions Weight Bearing Restrictions: Yes RLE Weight Bearing: Non weight bearing General:   Pain: Pain Assessment Pain Scale: 0-10 Pain Score: 0-No pain    Therapy/Group: Individual Therapy  Kristen Unger PT, DPT, CBIS  Supplemental Physical Therapist [place]    Pager [phone] Acute Rehab Office [phone]   11/23/2018, 3:51 PM  

## 2018-11-24 ENCOUNTER — Inpatient Hospital Stay (HOSPITAL_COMMUNITY): Payer: Medicare Other | Admitting: Occupational Therapy

## 2018-11-24 ENCOUNTER — Inpatient Hospital Stay (HOSPITAL_COMMUNITY): Payer: Medicare Other | Admitting: Physical Therapy

## 2018-11-24 DIAGNOSIS — R7309 Other abnormal glucose: Secondary | ICD-10-CM

## 2018-11-24 LAB — GLUCOSE, CAPILLARY
Glucose-Capillary: 161 mg/dL — ABNORMAL HIGH (ref 70–99)
Glucose-Capillary: 106 mg/dL — ABNORMAL HIGH (ref 70–99)
Glucose-Capillary: 159 mg/dL — ABNORMAL HIGH (ref 70–99)
Glucose-Capillary: 122 mg/dL — ABNORMAL HIGH (ref 70–99)

## 2018-11-24 NOTE — Progress Notes (Signed)
Occupational Therapy Session Note  Patient Details  Name: Jeremiah [name] MRN: [id number] Date of Birth: 1945/12/04  Today's Date: 11/24/2018 OT Individual Time: 6468-0321 OT Individual Time Calculation (min): 45 min    Short Term Goals: Week 2:  OT Short Term Goal 1 (Week 2): Pt will complete sit>stand at RW during LB dressing taask with mod A+1 OT Short Term Goal 2 (Week 2): Pt will complete toilet/BSC transfer with mod A OT Short Term Goal 3 (Week 2): Pt will complete toileting task with max A +1 in order to reduce caregiver burden  Skilled Therapeutic Interventions/Progress Updates:    Pt completed transfer from wheelchair to mat with mod assist via sliding board during session.  Once on the mat, had pt work on sit to stand transitions using heavy duty walker.  He was able to complete several sit to stand transitions, standing up to 1 min and 10 seconds with overall mod assist.  He does exhibit decreased hip and trunk extension in standing with increased lean to the left side as well.  Attempted to have him complete some heel to toe transitions but he was only able to move one time for 1 small step.  Also attempted to have him stand and remove his LUE from the walker to pick up cup.  He was able to complete, but needed heavy use of the mat to stabilize his left leg up against.  Attempted sit to stand as well with the LUE pushing from the mat and the RUE on the walker, but he was unable to complete.  Finished session with transfer back to the wheelchair via sliding board with mod assist and pt pushing wheelchair 3/4 of the way back to the room with supervision.  Therapist assisted the rest of the way with pt positioned in the wheelchair beside of the bed with call button in reach and alarm belt in place.    Therapy Documentation Precautions:  Precautions Precautions: Fall Restrictions Weight Bearing Restrictions: No RLE Weight Bearing: Non weight bearing  Pain: Pain Assessment Pain  Score: 0-No pain ADL: See Care Tool Section for details of ADL  Therapy/Group: Individual Therapy  Ronetta Molla OTR/L 11/24/2018, 12:39 PM

## 2018-11-24 NOTE — Progress Notes (Signed)
Physical Therapy Session Note  Patient Details  Name: Jeremiah [name] MRN: [id number] Date of Birth: Sep 01, 1946  Today's Date: 11/24/2018 PT Individual Time: 0800-0900 PT Individual Time Calculation (min): 60 min   Short Term Goals: Week 2:  PT Short Term Goal 1 (Week 2): Pt will perform bed mobility with minA PT Short Term Goal 2 (Week 2): Pt will perform transfers modA PT Short Term Goal 3 (Week 2): Pt will perform car transfer modA  Skilled Therapeutic Interventions/Progress Updates: Pt received supine in bed, denies pain and agreeable to treatment. Pt voided in urinal with setupA; then therapist noted pt to have already been incontinent of urine in brief. Rolling R/L with S and bedrails while therapist performed hygiene and donned clean brief, pants totalA. Supine>sit modA. Changed shirt while seated EOB setupA to retrieve clothing. Lateral scoot bed>w/c with transfer board and modA. Grooming at sink modI. W/c propulsion to gym BUE and S for strengthening and aerobic endurance. UE ergometer x10 min level 7 for strengthening and endurance. Returned to room totalA for energy conservation. Remained seated in w/c, alarm intact and all needs in reach at end of session. Requested shrinker order from MD as pt only has residual limb socks currently.     Therapy Documentation Precautions:  Precautions Precautions: Fall Restrictions Weight Bearing Restrictions: Yes RLE Weight Bearing: Non weight bearing    Therapy/Group: Individual Therapy  Harlon Ditty 11/24/2018, 9:15 AM

## 2018-11-24 NOTE — Progress Notes (Signed)
Physical Therapy Session Note  Patient Details  Name: Jeremiah [name] MRN: [id number] Date of Birth: 1946-10-07  Today's Date: 11/24/2018 PT Individual Time: 1403-1430 PT Individual Time Calculation (min): 27 min   Short Term Goals: Week 2:  PT Short Term Goal 1 (Week 2): Pt will perform bed mobility with minA PT Short Term Goal 2 (Week 2): Pt will perform transfers modA PT Short Term Goal 3 (Week 2): Pt will perform car transfer modA  Skilled Therapeutic Interventions/Progress Updates:    Patient received up in [facility], pleasant and willing to participate in therapy; able to self-propel WC approximately 178ft with S/VC, and focused session on standing/standing tolerance today. Son observed session and clarifies that patient has not been ambulatory in about 8 months to a year. Patient able to perform 4 sit to stands from [facility] height with heavy ModAx1/MinAx1, only able to maintain standing for 30-40 seconds however. Mod cues provided for head-hips relationship and correct hand and L foot placement. He was returned to his room totalA and left up in the [facility] with seatbelt alarm and son present/providing direct supervision.   Therapy Documentation Precautions:  Precautions Precautions: Fall Restrictions Weight Bearing Restrictions: No RLE Weight Bearing: Non weight bearing General:   \Pain: Pain Assessment Pain Scale: 0-10 Pain Score: 0-No pain    Therapy/Group: Individual Therapy  Nedra Hai PT, DPT, CBIS  Supplemental Physical Therapist [facility]    Pager [phone] Acute Rehab Office [phone]   11/24/2018, 3:49 PM

## 2018-11-24 NOTE — Progress Notes (Signed)
[place] PHYSICAL MEDICINE & REHABILITATION PROGRESS NOTE  Subjective/Complaints: Patient seen blood work with therapies this morning.  He states he slept well overnight.  He states he feels much better.  ROS: Denies CP, shortness of breath, nausea, vomiting.  Objective: Vital Signs: Blood pressure (!) 158/68, pulse 62, temperature 98.1 F (36.7 C), temperature source Oral, resp. rate 16, height 5\' 9"  (1.753 m), weight 73.6 kg, SpO2 100 %. No results found. Recent Labs    11/22/18 0926  WBC 12.2*  HGB 8.2*  HCT 24.8*  PLT 268   Recent Labs    11/22/18 0926  NA 135  K 3.7  CL 103  CO2 23  GLUCOSE 188*  BUN 9  CREATININE 1.11  CALCIUM 8.4*    Physical Exam: BP (!) 158/68 (BP Location: Right Arm)   Pulse 62   Temp 98.1 F (36.7 C) (Oral)   Resp 16   Ht 5\' 9"  (1.753 m)   Wt 73.6 kg   SpO2 100%   BMI 23.96 kg/m  Constitutional: No distress . Vital signs reviewed. HENT: Normocephalic.  Atraumatic. Eyes: EOMI. No discharge. Cardiovascular: RRR.  No JVD. Respiratory: CTA bilaterally.  Normal effort. GI: BS +. Non-distended. Musc: Right AKA Neurological: He is alert.  Some delay in processing, stable.  Follows commands. Motor: Bilateral upper extremities: 4/5 proximal distal, unchanged Right lower extremity: Hip flexion 4+/5, unchanged Left lower extremity: Hip flexion 3/5, knee extension 4-/5, ankle dorsiflexion 4/5, unchanged  Skin: Right AKA with dressing C/D/I Psychiatric: His affect is blunt. His speech is delayed. He is slowed.   Assessment/Plan: 1. 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  Physiatrist is providing close team supervision and 24 hour management of active medical problems listed below.  Physiatrist and rehab team continue to assess barriers to discharge/monitor patient progress toward functional and medical goals  Care  Tool:  Bathing  Bathing activity did not occur: Refused Body parts bathed by patient: Right arm, Left arm, Chest, Abdomen, Front perineal area, Right upper leg, Buttocks, Left lower leg, Face, Left upper leg         Bathing assist Assist Level: Minimal Assistance - Patient > 75%     Upper Body Dressing/Undressing Upper body dressing   What is the patient wearing?: Pull over shirt    Upper body assist Assist Level: Set up assist    Lower Body Dressing/Undressing Lower body dressing      What is the patient wearing?: Pants     Lower body assist Assist for lower body dressing: Maximal Assistance - Patient 25 - 49%     Toileting Toileting    Toileting assist Assist for toileting: Moderate Assistance - Patient 50 - 74%     Transfers Chair/bed transfer  Transfers assist  Chair/bed transfer activity did not occur: (stand pivot)  Chair/bed transfer assist level: Moderate Assistance - Patient 50 - 74%     Locomotion Ambulation   Ambulation assist   Ambulation activity did not occur: Safety/medical concerns          Walk 10 feet activity   Assist  Walk 10 feet activity did not occur: Safety/medical concerns        Walk 50 feet activity   Assist Walk 50 feet with 2 turns activity did not occur: Safety/medical concerns         Walk 150 feet activity   Assist Walk 150 feet activity did  not occur: Safety/medical concerns         Walk 10 feet on uneven surface  activity   Assist Walk 10 feet on uneven surfaces activity did not occur: Safety/medical concerns         Wheelchair     Assist Will patient use wheelchair at discharge?: Yes Type of Wheelchair: Manual    Wheelchair assist level: Supervision/Verbal cueing Max wheelchair distance: 150    Wheelchair 50 feet with 2 turns activity    Assist        Assist Level: Supervision/Verbal cueing   Wheelchair 150 feet activity     Assist Wheelchair 150 feet activity  did not occur: Safety/medical concerns   Assist Level: Supervision/Verbal cueing      Medical Problem List and Plan: 1.  Decreased functional mobility secondary to right AKA 11/09/2018  Continue CIR   Stump shrinker 2.  DVT Prophylaxis/Anticoagulation: Subcutaneous heparin. Monitor for any bleeding episodes 3. Pain Management:  Percocet as needed 4. Mood:  Provide emotional support 5. Neuropsych: This patient is not fully capable of making decisions on his own behalf. 6. Skin/Wound Care:  Routine skin checks 7. Fluids/Electrolytes/Nutrition/hypokalemia:  Routine ins and outs   BMP within acceptable range on 12/30 8. Acute blood loss anemia. Continue iron supplement  Hemoglobin 8.2 on 12/30  Will order labs for the end of this week  Continue to monitor 9. Hypertension.Norvasc 10 mg daily, Cozaar 100 mg daily, Toprol-XL 50 mg daily.   Labile on 1/1 10. Diabetes mellitus with peripheral neuropathy. Hemoglobin A1c 6.7.SSI. Patient on Amaryl 4 mg twice a day prior to admission. Resume as needed  Labile on 1/1 11. Hyperlipidemia. Zocor 12.  Hypoalbuminemia  Supplement initiated on 12/25 13.  Leukocytosis  Afebrile  WBCs 12.2 on 12/30  Continue to monitor 14.  C. difficile  Fiber added on 12/26, increased on 12/28  Probiotic started on 12/28  Dificid 200 mg twice daily x10 days started on 12/29  Diarrhea improving  LOS: 9 days A FACE TO FACE EVALUATION WAS PERFORMED  Ankit Karis Juba 11/24/2018, 9:47 AM

## 2018-11-24 NOTE — Progress Notes (Signed)
Physical Therapy Session Note  Patient Details  Name: Jeremiah [name] MRN: [id number] Date of Birth: [birth year]  Today's Date: 11/24/2018 PT Individual Time: 1300-1345 PT Individual Time Calculation (min): 45 min   Short Term Goals: Week 2:  PT Short Term Goal 1 (Week 2): Pt will perform bed mobility with minA PT Short Term Goal 2 (Week 2): Pt will perform transfers modA PT Short Term Goal 3 (Week 2): Pt will perform car transfer modA   Skilled Therapeutic Interventions/Progress Updates:   Pt received sitting in WC and agreeable to PT  WC mobility with supervision assist from PT 2 x 293f and min cues for doorway management and obstacle navigation in hall. SB transfer with mod assist overall and increased time to the R. Mod-max cues for sequecning and improved use of BUE to lift gluteals off sitting surface and improve lateral translation. .   Sitting balance to perform ball toss with over head throw 3 x 30 sec with distant supervision assist from PT. Pt able to improve consistency of catching balls outside BOS with increased attempts, no LOB noted and no impulsive movements to pick up missed balls from floor.    Press ups from Yoga blocks 2 x 5 with min assist from PT and moderate cues for proper LE placement and anterior weight shift to allow lift off sitting surface.   [facility] and standard walker. Moderate cues for improved use of BUE to off load sound LE and allow pivot of L  foot.   [facility] returned to room and [facility] and all needs met.        Therapy Documentation Precautions:  Precautions Precautions: Fall Restrictions Weight Bearing Restrictions: No RLE Weight Bearing: Non weight bearing   Pain: Pain Assessment Pain Score: 0-No pain    Therapy/Group: Individual Therapy  ALorie Phenix1/11/2018, 1:49 PM

## 2018-11-25 ENCOUNTER — Inpatient Hospital Stay (HOSPITAL_COMMUNITY): Payer: Medicare Other

## 2018-11-25 ENCOUNTER — Inpatient Hospital Stay (HOSPITAL_COMMUNITY): Payer: Medicare Other | Admitting: Physical Therapy

## 2018-11-25 ENCOUNTER — Inpatient Hospital Stay (HOSPITAL_COMMUNITY): Payer: Medicare Other | Admitting: Occupational Therapy

## 2018-11-25 DIAGNOSIS — E119 Type 2 diabetes mellitus without complications: Secondary | ICD-10-CM

## 2018-11-25 LAB — GLUCOSE, CAPILLARY
Glucose-Capillary: 130 mg/dL — ABNORMAL HIGH (ref 70–99)
Glucose-Capillary: 149 mg/dL — ABNORMAL HIGH (ref 70–99)
Glucose-Capillary: 109 mg/dL — ABNORMAL HIGH (ref 70–99)
Glucose-Capillary: 131 mg/dL — ABNORMAL HIGH (ref 70–99)

## 2018-11-25 MED ORDER — AMLODIPINE BESYLATE 10 MG PO TABS
10.0000 mg | ORAL_TABLET | Freq: Every day | ORAL | Status: DC
  Administered 2018-11-26 – 2018-12-06 (×11): 10 mg via ORAL
  Filled 2018-11-25 (×12): qty 1

## 2018-11-25 MED ORDER — GLIMEPIRIDE 1 MG PO TABS
1.0000 mg | ORAL_TABLET | Freq: Every day | ORAL | Status: DC
  Administered 2018-11-25 – 2018-12-07 (×13): 1 mg via ORAL
  Filled 2018-11-25 (×13): qty 1

## 2018-11-25 NOTE — Progress Notes (Signed)
Orthopedic Tech Progress Note Patient Details:  Jeremiah [name] 06/27/1946 [id number]  Patient ID: Jeremiah [name], male   DOB: [birth year], 73 y.o.   MRN: [id number]   [name] 11/25/2018, 9:16 AMCalled [facility] for right AKA stump shrinkers x2

## 2018-11-25 NOTE — Progress Notes (Addendum)
Physical Therapy Session Note  Patient Details  Name: Jeremiah [name] MRN: [id number] Date of Birth: [birth year]  Today's Date: 11/25/2018 PT Individual Time: 1030-1135 PT Individual Time Calculation (min): 65 min   Short Term Goals:  Week 2:  PT Short Term Goal 1 (Week 2): Pt will perform bed mobility with minA PT Short Term Goal 2 (Week 2): Pt will perform transfers modA PT Short Term Goal 3 (Week 2): Pt will perform car transfer modA  Skilled Therapeutic Interventions/Progress Updates:   Pt sitting up in w/c.  Pt wearing R shrinker sock, but without waist straps.  PT donned straps and instructed wife in use of straps; she needs more practice.  Wife observed remainder of session, criticizing pt for not trying, etc. PT explained to her that pt's lack of LE muscle mass limits his ability to move.  She seemed to eventually understand this.  W/c propulsion in room with supervision and cues for tight areas, x 150' with supervision on level tile in hallway.  PT instructed pt in techniques for tight turns and efficiency on long, straight paths.  Neuromuscular re-education via forced use, multimodal cues for RLE use on Kinetron, seated in w/c, at level 50 cm/sec.  PT stabilized R hip neutral aliggnent,  x 20 cycles targeting gluteal muscles, x 20 cycles x 2 targeting quadriceps muscles.    Poor alignment of R hip (in ER due to long- standing habit) impacts pt's ability to extend hip and knee for sit> stand.  Slide board transfer wc to mat to R with mod assist initially, then supervision and max cues.  Cues for head/hips relationship, elevating hips sufficiently. Pt benefitted from visual cues (targets 1,2,3) to stimulate him to continue to move across the board.  Pt unable to remember sequence of transfer after 5 minutes.  In sitting on raised mat, forward wt shift to reach out with R/L hands to retrieve items in front of him.    PT switched out w/c seat cushion for a higher one, and with  contouring to align bil thighs.  Pt with improved upright sitting, decreased posterior pelvic position and neutral Lhip rotation, on this cushion.  Pt stated that it felt more comfortable.   Slide board transfer mat> w/c using visual cues, slightly downhill, with supervision after board was placed.   Pt propelled w/c 100' headed back to room.   Pt left resting in w/c with seat belt alarm set and needs at hand.  Wife present.     Therapy Documentation Precautions:  Precautions Precautions: Fall Restrictions Weight Bearing Restrictions: Yes RLE Weight Bearing: Non weight bearing  Pain: pt denies      Therapy/Group: Individual Therapy  Lonya Johannesen 11/25/2018, 3:45 PM

## 2018-11-25 NOTE — Progress Notes (Signed)
Jeremiah [name] PHYSICAL MEDICINE & REHABILITATION PROGRESS NOTE  Subjective/Complaints: Patient seen sitting up at the edge of his bed eating breakfast this morning.  He states he slept well overnight.  ROS: Denies CP, shortness of breath, nausea, vomiting.  Objective: Vital Signs: Blood pressure (!) 156/81, pulse 85, temperature 97.6 F (36.4 C), resp. rate 16, height 5\' 9"  (1.753 m), weight 68.5 kg, SpO2 100 %. No results found. Recent Labs    11/22/18 0926  WBC 12.2*  HGB 8.2*  HCT 24.8*  PLT 268   Recent Labs    11/22/18 0926  NA 135  K 3.7  CL 103  CO2 23  GLUCOSE 188*  BUN 9  CREATININE 1.11  CALCIUM 8.4*    Physical Exam: BP (!) 156/81 (BP Location: Left Arm)   Pulse 85   Temp 97.6 F (36.4 C)   Resp 16   Ht 5\' 9"  (1.753 m)   Wt 68.5 kg   SpO2 100%   BMI 22.30 kg/m  Constitutional: No distress . Vital signs reviewed. HENT: Normocephalic.  Atraumatic. Eyes: EOMI. No discharge. Cardiovascular: RRR.  No JVD. Respiratory: CTA bilaterally.  Normal effort. GI: BS +. Non-distended. Musc: Right AKA Neurological: He is alert.  Some delay in processing, stable.  Follows commands. Motor: Bilateral upper extremities: 4/5 proximal distal, stable Right lower extremity: Hip flexion 4+/5, stable Left lower extremity: Hip flexion 3/5, knee extension 4-/5, ankle dorsiflexion 4/5, unchanged  Skin: Right AKA with staples C/D/I Psychiatric: His affect is blunt. His speech is delayed. He is slowed.   Assessment/Plan: 1. 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  Physiatrist is providing close team supervision and 24 hour management of active medical problems listed below.  Physiatrist and rehab team continue to assess barriers to discharge/monitor patient progress toward functional and medical goals  Care Tool:  Bathing  Bathing activity did not occur:  Refused Body parts bathed by patient: Right arm, Left arm, Chest, Abdomen, Front perineal area, Right upper leg, Buttocks, Left lower leg, Face, Left upper leg         Bathing assist Assist Level: Minimal Assistance - Patient > 75%     Upper Body Dressing/Undressing Upper body dressing   What is the patient wearing?: Pull over shirt    Upper body assist Assist Level: Supervision/Verbal cueing    Lower Body Dressing/Undressing Lower body dressing      What is the patient wearing?: Pants     Lower body assist Assist for lower body dressing: 2 Helpers     Toileting Toileting    Toileting assist Assist for toileting: 2 Helpers     Transfers Chair/bed transfer  Transfers assist  Chair/bed transfer activity did not occur: (sliding board)  Chair/bed transfer assist level: Moderate Assistance - Patient 50 - 74%     Locomotion Ambulation   Ambulation assist   Ambulation activity did not occur: Safety/medical concerns          Walk 10 feet activity   Assist  Walk 10 feet activity did not occur: Safety/medical concerns        Walk 50 feet activity   Assist Walk 50 feet with 2 turns activity did not occur: Safety/medical concerns         Walk 150 feet activity   Assist Walk 150 feet activity did not occur: Safety/medical concerns         Walk 10 feet on  uneven surface  activity   Assist Walk 10 feet on uneven surfaces activity did not occur: Safety/medical concerns         Wheelchair     Assist Will patient use wheelchair at discharge?: Yes Type of Wheelchair: Manual    Wheelchair assist level: Supervision/Verbal cueing Max wheelchair distance: 16950ft    Wheelchair 50 feet with 2 turns activity    Assist        Assist Level: Supervision/Verbal cueing   Wheelchair 150 feet activity     Assist Wheelchair 150 feet activity did not occur: Safety/medical concerns   Assist Level: Supervision/Verbal cueing       Medical Problem List and Plan: 1.  Decreased functional mobility secondary to right AKA 11/09/2018  Continue CIR   Stump shrinker ordered again,?  Misplacement/soiling of other shrinkers 2.  DVT Prophylaxis/Anticoagulation: Subcutaneous heparin. Monitor for any bleeding episodes 3. Pain Management:  Percocet as needed 4. Mood:  Provide emotional support 5. Neuropsych: This patient is not fully capable of making decisions on his own behalf. 6. Skin/Wound Care:  Routine skin checks 7. Fluids/Electrolytes/Nutrition/hypokalemia:  Routine ins and outs   BMP within acceptable range on 12/30 8. Acute blood loss anemia. Continue iron supplement  Hemoglobin 8.2 on 12/30  Labs ordered for tomorrow  Continue to monitor 9. Hypertension.  Norvasc 10 mg daily, changed to nightly on 1/3  Cozaar 100 mg daily, Toprol-XL 50 mg daily.   Labile on 1/2, elevated at night 10. Diabetes mellitus with peripheral neuropathy. Hemoglobin A1c 6.7.SSI. Patient on Amaryl 4 mg twice a day prior to admission. Resume as needed  Amaryl 1 mg started on 1/2 11. Hyperlipidemia. Zocor 12.  Hypoalbuminemia  Supplement initiated on 12/25 13.  Leukocytosis  Afebrile  WBCs 12.2 on 12/30  Labs ordered for tomorrow  Continue to monitor 14.  C. difficile  Fiber added on 12/26, increased on 12/28  Probiotic started on 12/28  Dificid 200 mg twice daily x10 days started on 12/29  Diarrhea improving  LOS: 10 days A FACE TO FACE EVALUATION WAS PERFORMED   Jeremiah [name]  11/25/2018, 8:43 AM

## 2018-11-25 NOTE — Progress Notes (Signed)
Physical Therapy Session Note  Patient Details  Name: Jeremiah [name] MRN: [id number] Date of Birth: 04/09/1946  Today's Date: 11/25/2018 PT Individual Time: 1405-1501 PT Individual Time Calculation (min): 56 min   Short Term Goals: Week 1:  PT Short Term Goal 1 (Week 1): Pt will complete bed mobility with mod A PT Short Term Goal 1 - Progress (Week 1): Met PT Short Term Goal 2 (Week 1): Pt will maintain sitting balance EOB with SBA consistently PT Short Term Goal 2 - Progress (Week 1): Met PT Short Term Goal 3 (Week 1): Pt will propel manual w/c x 100 ft with min A PT Short Term Goal 3 - Progress (Week 1): Met PT Short Term Goal 4 (Week 1): Pt will initiate standing as safe and able PT Short Term Goal 4 - Progress (Week 1): Met  Skilled Therapeutic Interventions/Progress Updates:   Pt received sitting in WC and agreeable to PT. WC mobility x 124f with supervision assist from Pt and min cues for use of momentum to reduce overall energy expenditure. [facility] and moderate cues for improved set up. Pt reports the he believe he had on incontinent bowel movement while performing press ups. PT [facility] Stand pivot transfer to bed with mod assist and max cues for sequencing, and AD management. Pt noted to require to push from RW rather than WC. Sit>supine with supervision assist. Rolling R and L in bed x 4 Bil for all clothing management. Pt noted to have not had an incontinent bowel movement upon inspection by PT. Once clothing management completed, Pt performed supine>sit with heavy use of bed rails and supervision assist. Stand pivot [facility]max assist overall due to LLE weakness. With final portion of transfer. Seated UE therex. Ball taps with 3lb weight 2 x 5. Trunk rotation with 1kg medicine ball, and diagonals x 5 Bil. Pt left sitting in WC with call bell in reach.       Therapy Documentation Precautions:  Precautions Precautions:  Fall Restrictions Weight Bearing Restrictions: Yes RLE Weight Bearing: Non weight bearing Vital Signs: Therapy Vitals Temp: 98.2 F (36.8 C) Pulse Rate: 69 Resp: 18 BP: (!) 137/57 Patient Position (if appropriate): Sitting Oxygen Therapy SpO2: 99 % O2 Device: Room Air Pain: denies    Therapy/Group: Individual Therapy  ALorie Phenix1/12/2018, 3:05 PM

## 2018-11-25 NOTE — Progress Notes (Signed)
Occupational Therapy Session Note  Patient Details  Name: Jeremiah [name] MRN: [id number] Date of Birth: [birth year]  Today's Date: 11/25/2018 OT Individual Time: 9798-9211 OT Individual Time Calculation (min): 75 min    Short Term Goals: Week 2:  OT Short Term Goal 1 (Week 2): Pt will complete sit>stand at RW during LB dressing taask with mod A+1 OT Short Term Goal 2 (Week 2): Pt will complete toilet/BSC transfer with mod A OT Short Term Goal 3 (Week 2): Pt will complete toileting task with max A +1 in order to reduce caregiver burden  Skilled Therapeutic Interventions/Progress Updates:    Pt seen for OT session focusing on ADL re-training and functional transfers. Pt sitting up in bed upon arrival with RN present and pt eating breakfast. Pt agreeable to tx session and denying pain. Pt not wearing shrinker or ACE wraps upon arrival. Cont to educate regarding need to wear 24/7 He transferred to EOB with mod A using hospital bed functions to eat remainder of meal sitting upright. Pt maintaining sitting balance with distant supervision.  He dressed seated on EOB. Several LOB episodes back onto bed while donning shirt, able to correct with cuing. He was able to thread L LE into pants with close supervision. He stood from elevated EOB with mod A to standard walker, +2 for clothing management while pt maintained B UE support.  Completed sliding board transfer EOB> drop arm BSC with mod A. Following rest break, completed x3 sit>stand from elevated BSC to standard walker, mod A overall. Pt tolerating ~7 seconds in standing before requiring seated rest break. Education provided regarding activity progression and reducing caregiver burden during ADL tasks. Completed min A sliding board transfer from [facility] to w/c with VCs for hand placement and technique.  Grooming tasks completed mod I from w/c level at sink. HE self propelled w/c ~32ft using B UEs and L LE with increaed time and effort. Taken remainder of  way to therapy gym total A for tine and energy conservation. He was able to corrrectly set-up w/c in prep for transfer with min A for managing Leg rest. Completed mod A sliding board transfer to EOM. Seated EOM, completed x10 to R and x10 to L lateral lean, required to come back to midline btwn each trial. Completed with supervision and rest breaks throughout. Mod A sliding board transfer to return to w/c with increased time and cuing.  Pt returned to room at end of session, left seated with all needs in reach.   Therapy Documentation Precautions:  Precautions Precautions: Fall Restrictions Weight Bearing Restrictions: No RLE Weight Bearing: Non weight bearing Pain:   No/denies pain   Therapy/Group: Individual Therapy  Keiran Sias L 11/25/2018, 7:08 AM

## 2018-11-26 ENCOUNTER — Inpatient Hospital Stay (HOSPITAL_COMMUNITY): Payer: Medicare Other | Admitting: Physical Therapy

## 2018-11-26 ENCOUNTER — Inpatient Hospital Stay (HOSPITAL_COMMUNITY): Payer: Medicare Other | Admitting: Occupational Therapy

## 2018-11-26 LAB — GLUCOSE, CAPILLARY
Glucose-Capillary: 115 mg/dL — ABNORMAL HIGH (ref 70–99)
Glucose-Capillary: 107 mg/dL — ABNORMAL HIGH (ref 70–99)
Glucose-Capillary: 166 mg/dL — ABNORMAL HIGH (ref 70–99)
Glucose-Capillary: 122 mg/dL — ABNORMAL HIGH (ref 70–99)

## 2018-11-26 LAB — CBC WITH DIFFERENTIAL/PLATELET
Abs Immature Granulocytes: 0.03 10*3/uL (ref 0.00–0.07)
Basophils Absolute: 0.1 10*3/uL (ref 0.0–0.1)
Basophils Relative: 1 %
Eosinophils Absolute: 0.8 10*3/uL — ABNORMAL HIGH (ref 0.0–0.5)
Eosinophils Relative: 10 %
HCT: 28.8 % — ABNORMAL LOW (ref 39.0–52.0)
Hemoglobin: 9 g/dL — ABNORMAL LOW (ref 13.0–17.0)
Immature Granulocytes: 0 %
Lymphocytes Relative: 34 %
Lymphs Abs: 2.8 10*3/uL (ref 0.7–4.0)
MCH: 26.4 pg (ref 26.0–34.0)
MCHC: 31.3 g/dL (ref 30.0–36.0)
MCV: 84.5 fL (ref 80.0–100.0)
Monocytes Absolute: 0.7 10*3/uL (ref 0.1–1.0)
Monocytes Relative: 8 %
Neutro Abs: 3.8 10*3/uL (ref 1.7–7.7)
Neutrophils Relative %: 47 %
Platelets: 293 10*3/uL (ref 150–400)
RBC: 3.41 MIL/uL — ABNORMAL LOW (ref 4.22–5.81)
RDW: 15.2 % (ref 11.5–15.5)
WBC: 8.2 10*3/uL (ref 4.0–10.5)
nRBC: 0 % (ref 0.0–0.2)

## 2018-11-26 LAB — BASIC METABOLIC PANEL
Anion gap: 8 (ref 5–15)
BUN: 15 mg/dL (ref 8–23)
CO2: 23 mmol/L (ref 22–32)
Calcium: 9.2 mg/dL (ref 8.9–10.3)
Chloride: 104 mmol/L (ref 98–111)
Creatinine, Ser: 1 mg/dL (ref 0.61–1.24)
GFR calc Af Amer: >60 mL/min (ref >60)
GFR calc non Af Amer: >60 mL/min (ref >60)
Glucose, Bld: 134 mg/dL — ABNORMAL HIGH (ref 70–99)
Potassium: 4.8 mmol/L (ref 3.5–5.1)
Sodium: 135 mmol/L (ref 135–145)

## 2018-11-26 NOTE — Progress Notes (Signed)
[place] PHYSICAL MEDICINE & REHABILITATION PROGRESS NOTE  Subjective/Complaints:  Denies phantom pain, no new issues overnite  ROS: Denies CP, shortness of breath, nausea, vomiting.  Objective: Vital Signs: Blood pressure (!) 156/57, pulse 76, temperature 98.3 F (36.8 C), temperature source Oral, resp. rate 18, height 5\' 9"  (1.753 m), weight 68.5 kg, SpO2 100 %. No results found. No results for input(s): WBC, HGB, HCT, PLT in the last 72 hours. Recent Labs    11/26/18 0725  NA 135  K 4.8  CL 104  CO2 23  GLUCOSE 134*  BUN 15  CREATININE 1.00  CALCIUM 9.2    Physical Exam: BP (!) 156/57 (BP Location: Right Arm)   Pulse 76   Temp 98.3 F (36.8 C) (Oral)   Resp 18   Ht 5\' 9"  (1.753 m)   Wt 68.5 kg   SpO2 100%   BMI 22.30 kg/m  Constitutional: No distress . Vital signs reviewed. HENT: Normocephalic.  Atraumatic. Eyes: EOMI. No discharge. Cardiovascular: RRR.  No JVD. Respiratory: CTA bilaterally.  Normal effort. GI: BS +. Non-distended. Musc: Right AKA Neurological: He is alert.  Some delay in processing, stable.  Follows commands. Motor: Bilateral upper extremities: 4/5 proximal distal, stable Right lower extremity: Hip flexion 4+/5, stable Left lower extremity: Hip flexion 3/5, knee extension 4-/5, ankle dorsiflexion 4/5, unchanged  Skin: Right AKA with staples C/D/I Psychiatric: His affect is blunt. His speech is delayed. He is slowed.   Assessment/Plan: 1. 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  Physiatrist is providing close team supervision and 24 hour management of active medical problems listed below.  Physiatrist and rehab team continue to assess barriers to discharge/monitor patient progress toward functional and medical goals  Care Tool:  Bathing  Bathing activity did not occur: Refused Body parts bathed by patient: Right arm, Left  arm, Chest, Abdomen, Front perineal area, Right upper leg, Buttocks, Left lower leg, Face, Left upper leg         Bathing assist Assist Level: Minimal Assistance - Patient > 75%     Upper Body Dressing/Undressing Upper body dressing   What is the patient wearing?: Pull over shirt    Upper body assist Assist Level: Supervision/Verbal cueing    Lower Body Dressing/Undressing Lower body dressing      What is the patient wearing?: Pants     Lower body assist Assist for lower body dressing: 2 Helpers     Toileting Toileting    Toileting assist Assist for toileting: 2 Helpers     Transfers Chair/bed transfer  Transfers assist  Chair/bed transfer activity did not occur: (sliding board)  Chair/bed transfer assist level: Moderate Assistance - Patient 50 - 74%     Locomotion Ambulation   Ambulation assist   Ambulation activity did not occur: Safety/medical concerns          Walk 10 feet activity   Assist  Walk 10 feet activity did not occur: Safety/medical concerns        Walk 50 feet activity   Assist Walk 50 feet with 2 turns activity did not occur: Safety/medical concerns         Walk 150 feet activity   Assist Walk 150 feet activity did not occur: Safety/medical concerns         Walk 10 feet on uneven surface  activity   Assist Walk 10 feet on uneven surfaces activity did not occur:  Safety/medical concerns         Wheelchair     Assist Will patient use wheelchair at discharge?: Yes Type of Wheelchair: Manual    Wheelchair assist level: Supervision/Verbal cueing Max wheelchair distance: 140ft    Wheelchair 50 feet with 2 turns activity    Assist        Assist Level: Supervision/Verbal cueing   Wheelchair 150 feet activity     Assist Wheelchair 150 feet activity did not occur: Safety/medical concerns   Assist Level: Supervision/Verbal cueing      Medical Problem List and Plan: 1.  Decreased  functional mobility secondary to right AKA 11/09/2018  Continue CIR -PT,OT  Stump shrinker ordered again,?  Misplacement/soiling of other shrinkers 2.  DVT Prophylaxis/Anticoagulation: Subcutaneous heparin. Monitor for any bleeding episodes 3. Pain Management:  Percocet as needed, controlled 1/3 4. Mood:  Provide emotional support 5. Neuropsych: This patient is not fully capable of making decisions on his own behalf. 6. Skin/Wound Care:  Routine skin checks 7. Fluids/Electrolytes/Nutrition/hypokalemia:  Routine ins and outs   BMP within acceptable range on 12/30 8. Acute blood loss anemia. Continue iron supplement  Hemoglobin 8.2 on 12/30  Labs ordered for tomorrow  Continue to monitor 9. Hypertension.  Norvasc 10 mg daily, changed to nightly on 1/3  Cozaar 100 mg daily, Toprol-XL 50 mg daily.  Vitals:   11/25/18 2000 11/26/18 0439  BP: (!) 161/62 (!) 156/57  Pulse: 75 76  Resp: 18 18  Temp: 99.1 F (37.3 C) 98.3 F (36.8 C)  SpO2: 99% 100%  mainly systolic amlodipine changed 10. Diabetes mellitus with peripheral neuropathy. Hemoglobin A1c 6.7.SSI. Patient on Amaryl 4 mg twice a day prior to admission. Resume as needed  Amaryl 1 mg started on 1/2 11. Hyperlipidemia. Zocor 12.  Hypoalbuminemia  Supplement initiated on 12/25 13.  Leukocytosis-likely C Diff  Afebrile  WBCs 12.2 on 12/30  Labs ordered for tomorrow  Continue to monitor 14.  C. difficile  Fiber added on 12/26, increased on 12/28  Probiotic started on 12/28  Dificid 200 mg twice daily x10 days started on 12/29  Diarrhea improving  LOS: 11 days A FACE TO FACE EVALUATION WAS PERFORMED  Erick Colace 11/26/2018, 8:24 AM

## 2018-11-26 NOTE — Progress Notes (Signed)
Physical Therapy Session Note  Patient Details  Name: Jeremiah [name] MRN: [id number] Date of Birth: 01-14-1946  Today's Date: 11/26/2018 PT Individual Time: 1015-1130 PT Individual Time Calculation (min): 75 min   Short Term Goals: Week 2:  PT Short Term Goal 1 (Week 2): Pt will perform bed mobility with minA PT Short Term Goal 2 (Week 2): Pt will perform transfers modA PT Short Term Goal 3 (Week 2): Pt will perform car transfer modA  Skilled Therapeutic Interventions/Progress Updates:    Patient received in [facility], pleasant and willing to participate in session today; son observed and assisted with session. Able to self-propel WC distances of 1101f with B UEs and S, totalA for sliding board placement and required MaxA for sliding board transfers to and from mat table today due to poor initiation and difficulty with sequencing. After reaching mat table, patient had to urgently urinate, did not have enough time to transfer back to chair without patient soiling himself so got urinal bottle and able to maintain sitting balance to use this in unsupported sitting at edge of mat table. Able to transfer sit to supine with MinA, and participated in flexibility and strengthening exercises appropriate for AKA on mat table, attempted getting into prone but noted patient had had incontinent BM and soiled his pants, so transferred to edge of mat table with ModA and [facility] due to difficulty sequencing/initiating. Able to self-propel self back to his room with S, and did sliding board transfer to bed with MaxA, sit to supine with S. Performed rolling for cleaning peri-area with S, totalA to don new brief and pants, then [place] sitting at EOB. Transferred back into WC with ModAx2 with nurse tech assisting for time management. [facility], all needs otherwise met this morning.   Therapy Documentation Precautions:   Precautions Precautions: Fall Restrictions Weight Bearing Restrictions: Yes RLE Weight Bearing: Non weight bearing General:   Vital Signs:  Pain: Pain Assessment Pain Scale: 0-10 Pain Score: 0-No pain    Therapy/Group: Individual Therapy  KDeniece ReePT, DPT, CBIS  Supplemental Physical [facility]rapist [facility]   Pager [phone] Rehab Office [phone]  11/26/2018, 12:34 PM

## 2018-11-26 NOTE — Progress Notes (Signed)
Occupational Therapy Session Note  Patient Details  Name: Jeremiah [name] Armes MRN: [id number] Date of Birth: 07-11-1946  Today's Date: 11/26/2018 OT Individual Time: [id number] and 1130-1200 and 1330-1415 OT Individual Time Calculation (min): 55 min and 30 min and 45 min   Short Term Goals: Week 2:  OT Short Term Goal 1 (Week 2): Pt will complete sit>stand at RW during LB dressing taask with mod A+1 OT Short Term Goal 2 (Week 2): Pt will complete toilet/BSC transfer with mod A OT Short Term Goal 3 (Week 2): Pt will complete toileting task with max A +1 in order to reduce caregiver burden  Skilled Therapeutic Interventions/Progress Updates:    Session One: PT seen for OT ADL bathing/dressing session. Pt sitting up in bed upon arrival, denying pain anda greeable to tx session and denying pain. PEricare/buttock hygiene completed in supie following pt's use of hand held urinal and new brief donned. PT able to reach to complete peri-care/buttock hygiene from sidelying position with cues.  He transferred to EOB with max A from flet bed using bed rails with cuing for sequencing/technique. Attempted stand pivot transfer with RW. PT able to stand from elevated EOB with mod A, however, unable to shift weight over [name] LE BOS needed in order to advance RW and complete turn. Attempted x3 without success. THen transferred via squat pivot with mod A and multi-modal cuing for sequencing/technique. Pt with skin breakdown on bottom, would benefit if able to progress past use of sliding board for functional transfers. He completed bathing/dressing routine from w/c level at sink. Set-up for UB bathing/dressing. With increased time for problem solving, pt able to reach to wash [name] LE and don lotion with supervision for dynamic sitting balance. R shrinker donned with total A with education provided regarding wear schedule and care of shrinker. He required +2 assist in order to have pants pulled up while standing at sink as pt  unable to maintain standing balance while therapist assisted with pants and therefore +2 required.  Pt left seated in w/c at end of session with MD present completing AM Baelynn Schmuhl. NT made aware of pt's position.   Session Two: Pt seen for OT session focusing on UE strengthening, head/hip relationship and transfer technique. Pt sitting upright in w/c upon arrival having just finished PT session. Pt willing to participate in another session despite fatigue, denied pain.  Taken to gym total A in w/c for time and energy conservation.  Completed mod A squat pivot transfer w/c<> EOM with cuing for sequencing/technique.  Seated EOM, completed modified push-ups with use of yoga blocks for support. Therapist seated in front to facilitate anterior weight shift and use lf [name] LE to assist with clearing buttock from mat. Educated regarding purpose of exercise for UE strengthening as well as technique for weight shift required for squat pivot transfers. Completed x2 sets of 5 with total buttock clearance. Rest breaks required throughout. Pt returned to w/c. Self propelled w/c ~40 ft able to use [name] LE to assist. Pt taken back to room remainder of way. Left seated in w/c with plans to complete grooming tasks seated in w/c at sink. All needs in reach.   Session Three: Pt seen for OT session focusing on sit>stands and standing balance/endurance. Pt asleep in w/c upon arrival, easily awoken and agreeable to tx session, denying pain. He self propelled w/c throughout unit with supervision using B UEs with VCs for effective w/c propulsion technique.  In therapy gym, completed sit>stand from w/c to  side of parallel bars. Required multiple trials, however, with max A pt able to achieeve full upright standing posture, tolerating ~30 seconds before requiring seated rest break. Completed x2 succesffully with rest break provided btwn trials.  Trialed use of STEDY for sit>stand for allow for other transfer method and promote LE  strengthening during sit>stand transfers. Despite max A and multiple set-up options, pt unable to come upright into stand in STEDY.  Pt returned to room, completed mod-max A squat pivot transfer back to bed. Pt returned to supine and left with all needs in reach and bed alarm on. Throughout session, pt requires max multi-modal cuing for sequencing and body movement/positioning for basic mobility tasks, increased time for processing required. Pt remains very motivated to return to functional standing abilities.   Therapy Documentation Precautions:  Precautions Precautions: Fall Restrictions Weight Bearing Restrictions: Yes RLE Weight Bearing: Non weight bearing   Therapy/Group: Individual Therapy  [name] 11/26/2018, 7:04 AM

## 2018-11-26 NOTE — Patient Care Conference (Signed)
Inpatient RehabilitationTeam Conference and Plan of Care Update Date: 11/26/2018   Time: 10:20 AM    Patient Name: Jeremiah [name]      Medical Record Number: [id number]  Date of Birth: Jul 22, 1946 Sex: Male         Room/Bed: 4M11C/4M11C-01 Payor Info: Payor: [employer] / Plan: [employer] MEDICARE / Product Type: *No Product type* /    Admitting Diagnosis: R AKA  Admit Date/Time:  11/15/2018  6:04 PM Admission Comments: No comment available   Primary Diagnosis:  <principal problem not specified> Principal Problem: <principal problem not specified>  Patient Active Problem List   Diagnosis Date Noted  . Labile blood glucose   . Enteritis due to Clostridium difficile   . Blood glucose abnormal   . Diarrhea   . Benign essential HTN   . Hypoalbuminemia due to protein-calorie malnutrition (HCC)   . Labile blood pressure   . 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 Postoperative pain   . Acute blood loss anemia   . Dyslipidemia   . Type 2 diabetes mellitus with peripheral neuropathy (HCC)   . S/P AKA (above knee amputation) (HCC) 11/12/2018  . Unilateral AKA, right (HCC)   . Hypertensive crisis   . Diabetes mellitus type 2 in nonobese (HCC)   . Pleural effusion 11/08/2018  . Fever   . Open wound of right foot   . Sepsis (HCC) 11/03/2018  . Altered mental status   . Acute cystitis without hematuria   . Leukocytosis   . Pressure injury of skin 07/08/2018  . Clostridium difficile colitis 07/07/2018  . Hypokalemia 07/07/2018  . HLD (hyperlipidemia) 07/07/2018  . Acute diverticulitis 06/04/2018  . Cholecystitis 05/24/2018  . Cholecystitis with cholelithiasis 05/21/2018  . Acute systolic heart failure (HCC) 03/27/2018  . Diabetes mellitus without complication (HCC) 01/30/2018  . Hypertension 01/30/2018  . Acute cholecystitis 01/30/2018  . AKI (acute kidney injury) (HCC) 01/30/2018  . Normocytic anemia 01/30/2018  . PVD (peripheral vascular disease) (HCC) 09/05/2014     Expected Discharge Date: Expected Discharge Date: 12/07/18  Team Members Present: Physician leading conference: Dr. Faith Rogue Social Worker Present: Dossie Der, LCSW Nurse Present: Ronny Bacon, RN PT Present: Karolee Stamps, PT OT Present: Roney Mans, OT SLP Present: Colin Benton, SLP PPS Coordinator present : Fae Pippin     Current Status/Progress Goal Weekly Team Focus  Medical   Right AKA, Cdiff, wound mgt, htn, dm  improve activity tolerance  pain mgt, wound care, ID considerations   Bowel/Bladder             Swallow/Nutrition/ Hydration             ADL's   Min-mod A basic sliding board transfers; mod A LB dresssing sit>stand to RW. Total A toileting. Set-up UB bathing/dressing and grooming  Min A overall, may need to downgrade pending pt progress  Functional standing balance/endurance, ADL re-training, functional transfers, general activity tolerance   Mobility   modA bed mobility, maxA transfers, S w/c propulsion  S bed mobility, minA transfers, S w/c propulsion  activity tolerance, transfer training, strengthening   Communication             Safety/Cognition/ Behavioral Observations            Pain             Skin                *See Care Plan and progress notes for long and short-term goals.  Barriers to Discharge  Current Status/Progress Possible Resolutions Date Resolved   Physician    Medical stability  C. Diff  See above  see medical progress notes      Nursing                  PT                    OT                  SLP                SW                Discharge Planning/Teaching Needs:  Home with wife who can provide some physical assist she is a HD pt herself but does well with this, drives self to HD      Team Discussion:  Pt working toward min-mod level goals and transfer via transfer board. Will need ramp for home and son to work on this. C-Diff nursing addressing. Watching skin issues-nursing addressing  with gerhardts. Will need family education with wife prior to DC  Revisions to Treatment Plan:  DC 1/14    Continued Need for Acute Rehabilitation Level of Care: The patient requires daily medical management by a physician with specialized training in physical medicine and rehabilitation for the following conditions: Daily direction of a multidisciplinary physical rehabilitation program to ensure safe treatment while eliciting the highest outcome that is of practical value to the patient.: Yes Daily medical management of patient stability for increased activity during participation in an intensive rehabilitation regime.: Yes Daily analysis of laboratory values and/or radiology reports with any subsequent need for medication adjustment of medical intervention for : Wound care problems;Post surgical problems;Blood pressure problems;Diabetes problems   I attest that I was present, lead the team conference, and concur with the assessment and plan of the team.   Lucy Chrisupree, Nyrah Demos G 11/26/2018, 3:21 PM

## 2018-11-27 ENCOUNTER — Inpatient Hospital Stay (HOSPITAL_COMMUNITY): Payer: Medicare Other | Admitting: Occupational Therapy

## 2018-11-27 ENCOUNTER — Inpatient Hospital Stay (HOSPITAL_COMMUNITY): Payer: Medicare Other

## 2018-11-27 LAB — GLUCOSE, CAPILLARY
Glucose-Capillary: 123 mg/dL — ABNORMAL HIGH (ref 70–99)
Glucose-Capillary: 134 mg/dL — ABNORMAL HIGH (ref 70–99)
Glucose-Capillary: 174 mg/dL — ABNORMAL HIGH (ref 70–99)
Glucose-Capillary: 149 mg/dL — ABNORMAL HIGH (ref 70–99)

## 2018-11-27 NOTE — Progress Notes (Signed)
Jeremiah [name] PHYSICAL MEDICINE & REHABILITATION PROGRESS NOTE  Subjective/Complaints:    ROS: Denies CP, shortness of breath, nausea, vomiting.  Objective: Vital Signs: Blood pressure (!) 149/62, pulse 67, temperature 98.3 F (36.8 C), temperature source Oral, resp. rate 16, height 5\' 9"  (1.753 m), weight 68.5 kg, SpO2 100 %. No results found. Recent Labs    11/26/18 0725  WBC 8.2  HGB 9.0*  HCT 28.8*  PLT 293   Recent Labs    11/26/18 0725  NA 135  K 4.8  CL 104  CO2 23  GLUCOSE 134*  BUN 15  CREATININE 1.00  CALCIUM 9.2    Physical Exam: BP (!) 149/62   Pulse 67   Temp 98.3 F (36.8 C) (Oral)   Resp 16   Ht 5\' 9"  (1.753 m)   Wt 68.5 kg   SpO2 100%   BMI 22.30 kg/m  Constitutional: No distress . Vital signs reviewed. HENT: Normocephalic.  Atraumatic. Eyes: EOMI. No discharge. Cardiovascular: RRR.  No JVD. Respiratory: CTA bilaterally.  Normal effort. GI: BS +. Non-distended. Musc: Right AKA Neurological: He is alert.  Some delay in processing, stable.  Follows commands. Motor: Bilateral upper extremities: 4/5 proximal distal, stable Right lower extremity: Hip flexion 4+/5, stable Left lower extremity: Hip flexion 3/5, knee extension 4-/5, ankle dorsiflexion 4/5, unchanged  Skin: Right AKA with staples C/D/I Psychiatric: His affect is blunt. His speech is delayed. He is slowed.   Assessment/Plan: 1. 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  Physiatrist is providing close team supervision and 24 hour management of active medical problems listed below.  Physiatrist and rehab team continue to assess barriers to discharge/monitor patient progress toward functional and medical goals  Care Tool:  Bathing  Bathing activity did not occur: Refused Body parts bathed by patient: Right arm, Left arm, Chest, Abdomen, Front perineal area, Right upper leg,  Buttocks, Left lower leg, Face, Left upper leg     Body parts n/a: Right lower leg   Bathing assist Assist Level: Contact Guard/Touching assist     Upper Body Dressing/Undressing Upper body dressing   What is the patient wearing?: Pull over shirt    Upper body assist Assist Level: Set up assist    Lower Body Dressing/Undressing Lower body dressing      What is the patient wearing?: Pants     Lower body assist Assist for lower body dressing: 2 Helpers     Toileting Toileting    Toileting assist Assist for toileting: 2 Helpers     Transfers Chair/bed transfer  Transfers assist  Chair/bed transfer activity did not occur: (sliding board)  Chair/bed transfer assist level: Maximal Assistance - Patient 25 - 49%     Locomotion Ambulation   Ambulation assist   Ambulation activity did not occur: Safety/medical concerns          Walk 10 feet activity   Assist  Walk 10 feet activity did not occur: Safety/medical concerns        Walk 50 feet activity   Assist Walk 50 feet with 2 turns activity did not occur: Safety/medical concerns         Walk 150 feet activity   Assist Walk 150 feet activity did not occur: Safety/medical concerns         Walk 10 feet on uneven surface  activity   Assist Walk 10 feet on uneven surfaces activity did not occur:  Safety/medical concerns         Wheelchair     Assist Will patient use wheelchair at discharge?: Yes Type of Wheelchair: Manual    Wheelchair assist level: Supervision/Verbal cueing Max wheelchair distance: 17150ft    Wheelchair 50 feet with 2 turns activity    Assist        Assist Level: Supervision/Verbal cueing   Wheelchair 150 feet activity     Assist Wheelchair 150 feet activity did not occur: Safety/medical concerns   Assist Level: Supervision/Verbal cueing      Medical Problem List and Plan: 1.  Decreased functional mobility secondary to right AKA  11/09/2018  Continue CIR -PT,OT  Stump shrinker ordered again,?  Misplacement/soiling of other shrinkers 2.  DVT Prophylaxis/Anticoagulation: Subcutaneous heparin. Monitor for any bleeding episodes 3. Pain Management:  Percocet as needed, controlled 1/4 4. Mood:  Provide emotional support 5. Neuropsych: This patient is not fully capable of making decisions on his own behalf. 6. Skin/Wound Care:  Routine skin checks 7. Fluids/Electrolytes/Nutrition/hypokalemia:  Routine ins and outs   BMP within acceptable range on 12/30, normal on 1/3 except mild elevation glucose 8. Acute blood loss anemia. Continue iron supplement  Hemoglobin 8.2 on 12/30  Labs ordered for tomorrow  Continue to monitor 9. Hypertension.  Norvasc 10 mg daily, changed to nightly on 1/3  Cozaar 100 mg daily, Toprol-XL 50 mg daily.  Vitals:   11/27/18 0500 11/27/18 0505  BP: (!) 149/68 (!) 149/62  Pulse: 70 67  Resp: 16   Temp: 98.3 F (36.8 C)   SpO2:  100%  mainly systolic amlodipine changed 10. Diabetes mellitus with peripheral neuropathy. Hemoglobin A1c 6.7.SSI. Patient on Amaryl 4 mg twice a day prior to admission. Resume as needed  Amaryl 1 mg started on 1/2 CBG (last 3)  Recent Labs    11/26/18 1654 11/26/18 2124 11/27/18 0700  GLUCAP 166* 122* 123*  controlled 1/4 11. Hyperlipidemia. Zocor 12.  Hypoalbuminemia  Supplement initiated on 12/25 13.  Leukocytosis-likely C Diff- now resolved 8.2K on 1/3  Afebrile  WBCs 12.2 on 12/30    Continue to monitor 14.  C. difficile  Fiber added on 12/26, increased on 12/28  Probiotic started on 12/28  Dificid 200 mg twice daily x10 days started on 12/29  Diarrhea resolved  LOS: 12 days A FACE TO FACE EVALUATION WAS PERFORMED  Jeremiah [name] 11/27/2018, 8:26 AM

## 2018-11-27 NOTE — Progress Notes (Signed)
Occupational Therapy Session Note  Patient Details  Name: Jeremiah [name] MRN: [id number] Date of Birth: 11/06/1946  Today's Date: 11/27/2018 OT Individual Time: 1015-1125 OT Individual Time Calculation (min): 70 min    Short Term Goals: Week 1:  OT Short Term Goal 1 (Week 1): Pt will complete basic transfer with +1 assist in order to decrease caregiver burden OT Short Term Goal 1 - Progress (Week 1): Met OT Short Term Goal 2 (Week 1): Pt will complete sit>stand at sink with +1 assist in prep for ADL task OT Short Term Goal 2 - Progress (Week 1): Progressing toward goal OT Short Term Goal 3 (Week 1): Pt will don pants at bed level with min A OT Short Term Goal 3 - Progress (Week 1): Met Week 2:  OT Short Term Goal 1 (Week 2): Pt will complete sit>stand at RW during LB dressing taask with mod A+1 OT Short Term Goal 2 (Week 2): Pt will complete toilet/BSC transfer with mod A OT Short Term Goal 3 (Week 2): Pt will complete toileting task with max A +1 in order to reduce caregiver burden  Skilled Therapeutic Interventions/Progress Updates:    Pt lying in bed upon OT arrival.  He agreed to sponge bathe and dress.  Provided set up.  Pt rolled to right and left on bed with verbal cues and supervision.  He bathed and then dressed EOB.  In bed  With HOB at 30 degrees, he needed mod assist to lean forward to reach Lower intact leg for bathing.  Pt needed mod assist to maintain to reach leg.  After sitting on EOB, pt transferred to wc with sliding board.  OT provided instructional cues for technique.  Pt needed max assist to perform.  Pt performed all grooming at sink with no assist.  Propelled wc 100 feet with increased time.  Left in wc in room with all needs in reach.    Therapy Documentation Precautions:  Precautions Precautions: Fall Restrictions Weight Bearing Restrictions: Yes RLE Weight Bearing: Non weight bearing      Pain: Pain Assessment Pain Scale: 0-10 Pain Score: 0-No pain                Therapy/Group: Individual Therapy  Edwards, Elizabeth J 11/27/2018, 12:34 PM 

## 2018-11-27 NOTE — Progress Notes (Signed)
Occupational Therapy Session Note  Patient Details  Name: Jeremiah [name] MRN: [id number] Date of Birth: 14-Nov-1946  Today's Date: 11/27/2018 OT Individual Time: 1405-1500 OT Individual Time Calculation (min): 55 min    Short Term Goals: Week 1:  OT Short Term Goal 1 (Week 1): Pt will complete basic transfer with +1 assist in order to decrease caregiver burden OT Short Term Goal 1 - Progress (Week 1): Met OT Short Term Goal 2 (Week 1): Pt will complete sit>stand at sink with +1 assist in prep for ADL task OT Short Term Goal 2 - Progress (Week 1): Progressing toward goal OT Short Term Goal 3 (Week 1): Pt will don pants at bed level with min A OT Short Term Goal 3 - Progress (Week 1): Met  Skilled Therapeutic Interventions/Progress Updates:    1:1. No pain reported. Pt received seated in w/c and propels w/c to/from all tx destination. Focus of session on core exercises with S modified sit ups from large foam wedge and trunk twists 2x10. Pt completes SBT with min A overall with facilitation of head hips relationship. Pt lays prone for 7 min on mat to stretch hip flexors. Pt requires MOD A overall for rolling and positioning into prone with elbows propped. Pt requires prolonged rest breaks throughout session d/t fatigue. Exited session with pt seated in w/c, call light in reach and all needs met  Therapy Documentation Precautions:  Precautions Precautions: Fall Restrictions Weight Bearing Restrictions: Yes RLE Weight Bearing: Non weight bearing General:   Vital Signs:  Pain:    no pain reported  Therapy/Group: Individual Therapy  Tonny Branch 11/27/2018, 2:45 PM

## 2018-11-28 ENCOUNTER — Inpatient Hospital Stay (HOSPITAL_COMMUNITY): Payer: Medicare Other

## 2018-11-28 LAB — GLUCOSE, CAPILLARY
Glucose-Capillary: 112 mg/dL — ABNORMAL HIGH (ref 70–99)
Glucose-Capillary: 101 mg/dL — ABNORMAL HIGH (ref 70–99)
Glucose-Capillary: 134 mg/dL — ABNORMAL HIGH (ref 70–99)
Glucose-Capillary: 151 mg/dL — ABNORMAL HIGH (ref 70–99)

## 2018-11-28 MED ORDER — ENOXAPARIN SODIUM 40 MG/0.4ML ~~LOC~~ SOLN
40.0000 mg | SUBCUTANEOUS | Status: DC
  Administered 2018-11-28 – 2018-12-06 (×9): 40 mg via SUBCUTANEOUS
  Filled 2018-11-28 (×9): qty 0.4

## 2018-11-28 NOTE — Progress Notes (Signed)
Occupational Therapy Session Note  Patient Details  Name: Jeremiah [name] MRN: [id number] Date of Birth: 1946/11/10  Today's Date: 11/28/2018 OT Individual Time: 1130-1200 Session 2: 1430-1530 OT Individual Time Calculation (min): 30 min Session 2: 60 min   Short Term Goals: Week 2:  OT Short Term Goal 1 (Week 2): Pt will complete sit>stand at RW during LB dressing taask with mod A+1 OT Short Term Goal 2 (Week 2): Pt will complete toilet/BSC transfer with mod A OT Short Term Goal 3 (Week 2): Pt will complete toileting task with max A +1 in order to reduce caregiver burden  Skilled Therapeutic Interventions/Progress Updates:    Session focused on bathing and dressing tasks per pt request. Pt received supine with no c/o pain. Pt completed UB bathing with set up assist. Min A to don top to pull down posteriorly when long sitting in bed, using bed rails as support. Pt completed front peri hygiene with set up, max A to complete posteriorly to contain incontinent BM. Mod A to don pants supine. Attempted to assist pt into bridge position to pull up pants, but unable to sustain position, therefore rolled R and L to pull up, min A. Pt completed lateral scoot transfer with slideboard to w/c with mod A. Pt was left sitting up in w/c with NT present, chair alarm belt fastened.   Session 2: Session focused on B UE strengthening/endurance and ADL transfers. Pt received in w/c with no c/o pain. Pt completed w/c propulsion for 100 ft, requiring frequent rest breaks and increased time. Pt completed lateral scoot with slideboard to therapy mat with max A. Pt held 2lb medicine ball and completed trunk rotations and flex/ext to increase core stabilization/control for increased safety during lateral leans. Pt held 2lb ball and reached overhead and forward to strengthen B UE in prep for more functional UE use during ADLs. Pt c/o urgent BM and completed lateral scoot with slideboard back to w/c with max A. Pt was returned  to room and transitioned back to supine. Max A for peri hygiene and clothing management. Pt was left supine with all needs met, bed alarm set.   Therapy Documentation Precautions:  Precautions Precautions: Fall Restrictions Weight Bearing Restrictions: Yes RLE Weight Bearing: Non weight bearing   Vital Signs: Therapy Vitals Temp: 98.3 F (36.8 C) Temp Source: Oral Pulse Rate: 65 BP: 132/64 Patient Position (if appropriate): Lying Oxygen Therapy SpO2: 100 %   Therapy/Group: Individual Therapy  Curtis Sites 11/28/2018, 7:49 AM

## 2018-11-28 NOTE — Progress Notes (Signed)
[facility] PHYSICAL MEDICINE & REHABILITATION PROGRESS NOTE  Subjective/Complaints:  2 semi formed stools yesterday, no abd pain  ROS: Denies CP, shortness of breath, nausea, vomiting.  Objective: Vital Signs: Blood pressure 132/64, pulse 65, temperature 98.3 F (36.8 C), temperature source Oral, resp. rate 18, height 5\' 9"  (1.753 m), weight 68.5 kg, SpO2 100 %. No results found. Recent Labs    11/26/18 0725  WBC 8.2  HGB 9.0*  HCT 28.8*  PLT 293   Recent Labs    11/26/18 0725  NA 135  K 4.8  CL 104  CO2 23  GLUCOSE 134*  BUN 15  CREATININE 1.00  CALCIUM 9.2    Physical Exam: BP 132/64 (BP Location: Left Arm)   Pulse 65   Temp 98.3 F (36.8 C) (Oral)   Resp 18   Ht 5\' 9"  (1.753 m)   Wt 68.5 kg   SpO2 100%   BMI 22.30 kg/m  Constitutional: No distress . Vital signs reviewed. HENT: Normocephalic.  Atraumatic. Eyes: EOMI. No discharge. Cardiovascular: RRR.  No JVD. Respiratory: CTA bilaterally.  Normal effort. GI: BS +. Non-distended. Musc: Right AKA Neurological: He is alert.  Some delay in processing, stable.  Follows commands. Motor: Bilateral upper extremities: 4/5 proximal distal, stable Right lower extremity: Hip flexion 4+/5, stable Left lower extremity: Hip flexion 3/5, knee extension 4-/5, ankle dorsiflexion 4/5, unchanged  Skin: Right AKA with staples C/D/I Psychiatric: His affect is blunt. His speech is delayed. He is slowed.   Assessment/Plan: 1. 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  Physiatrist is providing close team supervision and 24 hour management of active medical problems listed below.  Physiatrist and rehab team continue to assess barriers to discharge/monitor patient progress toward functional and medical goals  Care Tool:  Bathing  Bathing activity did not occur: Refused Body parts bathed by patient: Right arm, Left  arm, Chest, Abdomen, Front perineal area, Right upper leg, Left lower leg, Face, Left upper leg   Body parts bathed by helper: Buttocks Body parts n/a: Right lower leg   Bathing assist Assist Level: Minimal Assistance - Patient > 75%     Upper Body Dressing/Undressing Upper body dressing   What is the patient wearing?: Pull over shirt    Upper body assist Assist Level: Set up assist    Lower Body Dressing/Undressing Lower body dressing      What is the patient wearing?: Pants, Incontinence brief, Ace wrap/stump shrinker     Lower body assist Assist for lower body dressing: Maximal Assistance - Patient 25 - 49%     Toileting Toileting    Toileting assist Assist for toileting: 2 Helpers     Transfers Chair/bed transfer  Transfers assist  Chair/bed transfer activity did not occur: (sliding board)  Chair/bed transfer assist level: Maximal Assistance - Patient 25 - 49%     Locomotion Ambulation   Ambulation assist   Ambulation activity did not occur: Safety/medical concerns          Walk 10 feet activity   Assist  Walk 10 feet activity did not occur: Safety/medical concerns        Walk 50 feet activity   Assist Walk 50 feet with 2 turns activity did not occur: Safety/medical concerns         Walk 150 feet activity   Assist Walk 150 feet activity did not occur: Safety/medical concerns  Walk 10 feet on uneven surface  activity   Assist Walk 10 feet on uneven surfaces activity did not occur: Safety/medical concerns         Wheelchair     Assist Will patient use wheelchair at discharge?: Yes Type of Wheelchair: Manual    Wheelchair assist level: Supervision/Verbal cueing Max wheelchair distance: 156ft    Wheelchair 50 feet with 2 turns activity    Assist        Assist Level: Supervision/Verbal cueing   Wheelchair 150 feet activity     Assist Wheelchair 150 feet activity did not occur: Safety/medical  concerns   Assist Level: Supervision/Verbal cueing      Medical Problem List and Plan: 1.  Decreased functional mobility secondary to right AKA 11/09/2018  Continue CIR -PT,OT  Stump shrinker ordered again,?  Misplacement/soiling of other shrinkers 2.  DVT Prophylaxis/Anticoagulation: Subcutaneous heparin. Monitor for any bleeding episodes 3. Pain Management:  Percocet as needed, controlled 1/4 4. Mood:  Provide emotional support 5. Neuropsych: This patient is not fully capable of making decisions on his own behalf. 6. Skin/Wound Care:  Routine skin checks 7. Fluids/Electrolytes/Nutrition/hypokalemia:  Routine ins and outs   BMP within acceptable range on 12/30, normal on 1/3 except mild elevation glucose 8. Acute blood loss anemia. Continue iron supplement  Hemoglobin 9.0 on 1/3  Labs ordered for tomorrow  Continue to monitor 9. Hypertension.  Norvasc 10 mg daily, changed to nightly on 1/3  Cozaar 100 mg daily, Toprol-XL 50 mg daily.  Vitals:   11/27/18 1922 11/28/18 0417  BP: (!) 138/58 132/64  Pulse: 68 65  Resp: 18   Temp: 98 F (36.7 C) 98.3 F (36.8 C)  SpO2: 100% 100%  controlled 1/5 10. Diabetes mellitus with peripheral neuropathy. Hemoglobin A1c 6.7.SSI. Patient on Amaryl 4 mg twice a day prior to admission. Resume as needed  Amaryl 1 mg started on 1/2 CBG (last 3)  Recent Labs    11/27/18 1655 11/27/18 2106 11/28/18 0626  GLUCAP 174* 149* 112*  controlled 1/5, some lability no dose adjustment 11. Hyperlipidemia. Zocor 12.  Hypoalbuminemia  Supplement initiated on 12/25 13.  Leukocytosis-likely C Diff- now resolved 8.2K on 1/3  Afebrile  WBCs 12.2 on 12/30    Continue to monitor 14.  C. difficile  Fiber added on 12/26, increased on 12/28  Probiotic started on 12/28  Dificid 200 mg twice daily x10 days started on 12/29  Diarrhea resolved  LOS: 13 days A FACE TO FACE EVALUATION WAS PERFORMED  Erick Colace 11/28/2018, 8:04 AM

## 2018-11-29 ENCOUNTER — Inpatient Hospital Stay (HOSPITAL_COMMUNITY): Payer: Medicare Other

## 2018-11-29 ENCOUNTER — Inpatient Hospital Stay (HOSPITAL_COMMUNITY): Payer: Medicare Other | Admitting: Occupational Therapy

## 2018-11-29 DIAGNOSIS — E1142 Type 2 diabetes mellitus with diabetic polyneuropathy: Secondary | ICD-10-CM

## 2018-11-29 LAB — GLUCOSE, CAPILLARY
Glucose-Capillary: 124 mg/dL — ABNORMAL HIGH (ref 70–99)
Glucose-Capillary: 145 mg/dL — ABNORMAL HIGH (ref 70–99)
Glucose-Capillary: 147 mg/dL — ABNORMAL HIGH (ref 70–99)
Glucose-Capillary: 186 mg/dL — ABNORMAL HIGH (ref 70–99)

## 2018-11-29 NOTE — Progress Notes (Addendum)
Submission of hearing aid lost in safety zone. Educated pt on lovenox. He initially refused, but was able to get him to accept the injection, along with the assistance of spouse. High traffic areas were cleaned in room by Clinical research associate and tech today (used orange top wipes); twice today.

## 2018-11-29 NOTE — Progress Notes (Addendum)
Physical Therapy Session Note  Patient Details  Name: Jeremiah [name] MRN: [id number] Date of Birth: 05-18-1946  Today's Date: 11/29/2018 PT Individual Time: 1045-1200 and 1400-1445 PT Individual Time Calculation (min): 75 min , and 45 min  Short Term Goals: Week 2:  PT Short Term Goal 1 (Week 2): Pt will perform bed mobility with minA PT Short Term Goal 2 (Week 2): Pt will perform transfers modA PT Short Term Goal 3 (Week 2): Pt will perform car transfer modA    Skilled Therapeutic Interventions/Progress Updates: \  tx 1:  Pt sitting up in w/c.  Strengthening LLE using Kinetron in sitting, at level 50 cm/sec; 1 x 25 targeting quadriceps muscles, 1 x 25 cycles targeting gluteal muscles.  W/c propulsion using bil UEs x 200' on level tile, supervision.  Cues for efficiency.  Squat pivot wc> mat to L with mod assist.  Therapeutic activities in sitting edge of mat to promote forward wt shift, cervical flexion, head/hips relationship, elevating hips to avoid skin shear when transferring.  Pt reached out of BOS forward with R/L hands to retrieve clothes pins and place on horizontal bars.  When allowed to pull up with L hand and push up with R hand, pt is able to elevate hips slightly better.  Pt needed max assist to compose large vertical -piece puzzle with picture of completed puzzle.  Pt stated " I can't see anything much. "  Pt noted to have R field cut.   Pt unable to remember sequence of steps for transfer.  Pt returned to room and set up for lunch.  Pt left with NT checking his BS; seat belt alarm set.  tx 2:  Pt sitting up in w/c.  He was upset because he thought he was finished with therapies for the day.  He agreed to PT in his room.  Pt attempted initial components of various transfers w/c> bed to L: pulling up on headboard, pulling up on side rail near [facility], and typical squat pivot.  Pt stated that he felt "worn out", all required max assist.    PT placed w/c facing side of  bed; A-P transfer with mod assist, but pt continues to have problems elevating hips enough to prevent shear.  In L side lying: neuro re-ed RLE via multimodal cues for 2 x 10 R hip extension, 2 x 10 R hip abduction.  PT re-donned shrinker sock which had slid down.  Pt left resting in bed with needs at hand and alarm set.    Therapy Documentation Precautions:  Precautions Precautions: Fall Restrictions Weight Bearing Restrictions: Yes RLE Weight Bearing: Non weight bearing   Pain: 7/10 R residualicated limb AM, premed 6/10 R residusl limb PM, medicated during session  Therapy/Group: Individual Therapy  Hulbert Branscome 11/29/2018, 12:19 PM

## 2018-11-29 NOTE — Progress Notes (Signed)
Per report on 11/27/18, reporting nurse states that patient misplaced one of his hearing devices and patient didn't realize it was missing. This Clinical research associate checked his rrom and surroundings and patient only has one device at his bedside. Writer f/u again today and device is still missing.

## 2018-11-29 NOTE — Progress Notes (Signed)
[facility] PHYSICAL MEDICINE & REHABILITATION PROGRESS NOTE  Subjective/Complaints: Patient seen sitting up at the edge of his bed working with therapy this morning.  He states he slept well overnight.  He wants to go home.  ROS: Denies CP, shortness of breath, nausea, vomiting.  Objective: Vital Signs: Blood pressure (!) 142/66, pulse (!) 58, temperature 98 F (36.7 C), temperature source Oral, resp. rate 18, height 5\' 9"  (1.753 m), weight 68.5 kg, SpO2 96 %. No results found. No results for input(s): WBC, HGB, HCT, PLT in the last 72 hours. No results for input(s): NA, K, CL, CO2, GLUCOSE, BUN, CREATININE, CALCIUM in the last 72 hours.  Physical Exam: BP (!) 142/66 (BP Location: Right Arm)   Pulse (!) 58   Temp 98 F (36.7 C) (Oral)   Resp 18   Ht 5\' 9"  (1.753 m)   Wt 68.5 kg   SpO2 96%   BMI 22.30 kg/m  Constitutional: No distress . Vital signs reviewed. HENT: Normocephalic.  Atraumatic. Eyes: EOMI. No discharge. Cardiovascular: RRR.  No JVD. Respiratory: CTA bilaterally.  Normal effort. GI: BS +. Non-distended. Musc: Right AKA Neurological: He is alert.  Some delay in processing, stable.  Follows commands. Motor: Bilateral upper extremities: 4/5 proximal distal, unchanged Right lower extremity: Hip flexion 4+/5, unchanged Left lower extremity: Hip flexion 3/5, knee extension 4-/5, ankle dorsiflexion 4/5, unchanged  Skin: Right AKA with shrinker C/D/I Psychiatric: His affect is blunt. His speech is delayed. He is slowed.   Assessment/Plan: 1. 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  Physiatrist is providing close team supervision and 24 hour management of active medical problems listed below.  Physiatrist and rehab team continue to assess barriers to discharge/monitor patient progress toward functional and medical goals  Care Tool:  Bathing  Bathing  activity did not occur: Refused Body parts bathed by patient: Right arm, Left arm, Chest, Abdomen, Front perineal area, Right upper leg, Left lower leg, Face, Left upper leg   Body parts bathed by helper: Buttocks Body parts n/a: Right lower leg   Bathing assist Assist Level: Minimal Assistance - Patient > 75%     Upper Body Dressing/Undressing Upper body dressing   What is the patient wearing?: Pull over shirt    Upper body assist Assist Level: Minimal Assistance - Patient > 75%    Lower Body Dressing/Undressing Lower body dressing      What is the patient wearing?: Pants, Incontinence brief, Ace wrap/stump shrinker     Lower body assist Assist for lower body dressing: Moderate Assistance - Patient 50 - 74%     Toileting Toileting    Toileting assist Assist for toileting: 2 Helpers     Transfers Chair/bed transfer  Transfers assist  Chair/bed transfer activity did not occur: (sliding board)  Chair/bed transfer assist level: Moderate Assistance - Patient 50 - 74%     Locomotion Ambulation   Ambulation assist   Ambulation activity did not occur: Safety/medical concerns          Walk 10 feet activity   Assist  Walk 10 feet activity did not occur: Safety/medical concerns        Walk 50 feet activity   Assist Walk 50 feet with 2 turns activity did not occur: Safety/medical concerns         Walk 150 feet activity   Assist Walk 150 feet activity did not occur: Safety/medical concerns  Walk 10 feet on uneven surface  activity   Assist Walk 10 feet on uneven surfaces activity did not occur: Safety/medical concerns         Wheelchair     Assist Will patient use wheelchair at discharge?: Yes Type of Wheelchair: Manual    Wheelchair assist level: Supervision/Verbal cueing Max wheelchair distance: 124ft    Wheelchair 50 feet with 2 turns activity    Assist        Assist Level: Supervision/Verbal cueing    Wheelchair 150 feet activity     Assist Wheelchair 150 feet activity did not occur: Safety/medical concerns   Assist Level: Supervision/Verbal cueing      Medical Problem List and Plan: 1.  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  DVT Prophylaxis/Anticoagulation: Subcutaneous heparin. Monitor for any bleeding episodes 3. Pain Management:  Percocet as needed  Controlled on 1/6 4. Mood:  Provide emotional support 5. Neuropsych: This patient is not fully capable of making decisions on his own behalf. 6. Skin/Wound Care:  Routine skin checks 7. Fluids/Electrolytes/Nutrition/hypokalemia:  Routine ins and outs   BMP within acceptable range on 1/30, except for glucose 8. Acute blood loss anemia. Continue iron supplement  Hemoglobin 9.0 on 1/3  Continue to monitor 9. Hypertension.  Norvasc 10 mg daily, changed to nightly on 1/3  Cozaar 100 mg daily, Toprol-XL 50 mg daily.  Vitals:   11/29/18 0604 11/29/18 0904  BP: 130/62 (!) 142/66  Pulse: 63 (!) 58  Resp:  18  Temp:    SpO2: 100% 96%   Relatively controlled on 1/6 10. Diabetes mellitus with peripheral neuropathy. Hemoglobin A1c 6.7.SSI. Patient on Amaryl 4 mg twice a day prior to admission. Resume as needed  Amaryl 1 mg started on 1/2 CBG (last 3)  Recent Labs    11/28/18 1703 11/28/18 2128 11/29/18 0630  GLUCAP 134* 151* 124*   Relatively controlled on 1/6 11. Hyperlipidemia. Zocor 12.  Hypoalbuminemia  Supplement initiated on 12/25 13.  Leukocytosis-likely C Diff- now resolved   Afebrile  WBCs 8.2 on 1/3   Continue to monitor 14.  C. difficile  Fiber added on 12/26, increased on 12/28  Probiotic started on 12/28  Dificid 200 mg twice daily x10 days started on 12/29  LOS: 14 days A FACE TO FACE EVALUATION WAS PERFORMED  Lecretia Buczek Karis Juba 11/29/2018, 9:14 AM

## 2018-11-29 NOTE — Plan of Care (Signed)
  Problem: RH SKIN INTEGRITY Goal: RH STG SKIN FREE OF INFECTION/BREAKDOWN Description Manage skin with min assist   Outcome: Progressing Goal: RH STG ABLE TO PERFORM INCISION/WOUND CARE W/ASSISTANCE Description STG Able To Perform Incision/Wound Care With min Assistance.   Outcome: Progressing Kept area clean, administered topical on area. Surgical wound no noted drainage

## 2018-11-29 NOTE — Progress Notes (Signed)
Occupational Therapy Session Note  Patient Details  Name: Jeremiah [name] MRN: [id number] Date of Birth: June 07, 1946  Today's Date: 11/29/2018 OT Individual Time: 5397-6734 and 1300-1400 OT Individual Time Calculation (min): 75 min and 60 min   Short Term Goals: Week 2:  OT Short Term Goal 1 (Week 2): Pt will complete sit>stand at RW during LB dressing taask with mod A+1 OT Short Term Goal 2 (Week 2): Pt will complete toilet/BSC transfer with mod A OT Short Term Goal 3 (Week 2): Pt will complete toileting task with max A +1 in order to reduce caregiver burden  Skilled Therapeutic Interventions/Progress Updates:    Session One: Pt seen for OT session focusing on ADL re-training and toileting task. Pt sitting up in bed upon arrival, eating breakfast and agreeable to tx session, denying pain. He was not wearing shrinker and reports that he did not wear it at all yesterday stating nursing staff took it off and didn't re-apply. Cont with education to pt and staff regarding wearing of shrinker at all times, pt voiced understanding. He transferred to EOB with min A using hospital bed functions. Ate remainder of breakfast seated EOB. WHile doing so, discussed d/c planning and DME recommendations. Pt open to getting hospital bed for home use and reports family is currently building ramp.  HE dressed seated on EOB, severalposterior LOB episodes onto the bed, able to recover with increased time using hospital bed functions. COmpleted sit>Stand from elevated EOB with mod A and therapist provided total A to pull pants up. He completed sliding board transfer to drop arm BSC with min-mod A, increased time and with cuing for head hip relationship. Pt unable to complete squat pivot of this distance and therefore sliding board used. He completed sit>stand to standard walker from [name] with mod A in order for clothes to be pulled down. Pt able to have continent BM on BSC, total A for hygiene as t unable to motor plan  lateral leans to complete thoroughly. Required +2 assist to have pants pulled up. Max A slide board to w/c following toileting task. Pt left seated in w/c at end of session, RN present.  Pt cont to display poor insight into deficits and poor functional problem solving skills. OT spoke with pt's wife on phone during session regarding pt's CLOF, DME, and scheduling family ed sessions in prep for scheduled d/c home next week.   Session Two: Pt seen for OT session focusing on UE/LE strengthening and endurance in prep for functional tasks. Pt sitting upright in w/c upon arrival, agreable to tx session. He voiced pain in residual R LE, RN made aware and pt repositioned for comfort. He self propelled w/c throughout unit with supervision, VCs for effective propulsion technique. Completed mod A squat pivot transfer w/c> EOM with multi-modal cuing for technique. Seated EOM, completed x3 sit>stand from elevated EOM, required B UE use on standard walker and max A to come into standing. Pt tolerating ~20 seconds in standing each trial before requiring seated rest break. Extended rest breaks required btwn each trial. Pt then positioned in supine, completed x3 sets of 5 glute bridges, R ABduction and R leg lift with VCs and rest breaks btwn trials.  Returned to sitting EOM, completed core strengthening exercise reaching to floor to pick up #4 weighted ball and return to upright sitting. Completed with min A x5 with rest break btwn each trial.Educated pt regarding this is exercise only and do not want him attempting to reach to floor  without assist from therapist.  Pt returned to w/c at end of session, self-propelled back to room. Pt left seated in w/c with all needs in reach, chair belt alarm donned.   Therapy Documentation Precautions:  Precautions Precautions: Fall Restrictions Weight Bearing Restrictions: Yes RLE Weight Bearing: Non weight bearing Pain: Pain Assessment Pain Scale: 0-10 Pain Score: 0-No  pain   Therapy/Group: Individual Therapy  Giamarie Bueche L 11/29/2018, 7:05 AM

## 2018-11-30 ENCOUNTER — Inpatient Hospital Stay (HOSPITAL_COMMUNITY): Payer: Medicare Other | Admitting: Physical Therapy

## 2018-11-30 ENCOUNTER — Inpatient Hospital Stay (HOSPITAL_COMMUNITY): Payer: Medicare Other | Admitting: Occupational Therapy

## 2018-11-30 ENCOUNTER — Ambulatory Visit: Payer: Medicare Other | Admitting: Cardiovascular Disease

## 2018-11-30 LAB — GLUCOSE, CAPILLARY
Glucose-Capillary: 141 mg/dL — ABNORMAL HIGH (ref 70–99)
Glucose-Capillary: 111 mg/dL — ABNORMAL HIGH (ref 70–99)
Glucose-Capillary: 118 mg/dL — ABNORMAL HIGH (ref 70–99)
Glucose-Capillary: 151 mg/dL — ABNORMAL HIGH (ref 70–99)

## 2018-11-30 NOTE — Progress Notes (Signed)
Physical Therapy Session Note  Patient Details  Name: Jeremiah [name] MRN: [id number] Date of Birth: Jan 16, 1946  Today's Date: 11/30/2018 PT Individual Time: 1505-6979 PT Individual Time Calculation (min): 27 min   Short Term Goals: Week 2:  PT Short Term Goal 1 (Week 2): Pt will perform bed mobility with minA PT Short Term Goal 2 (Week 2): Pt will perform transfers modA PT Short Term Goal 3 (Week 2): Pt will perform car transfer modA  Skilled Therapeutic Interventions/Progress Updates:   Pt in w/c and agreeable to therapy, denies pain. Pt self-propelled w/c to gym w/ supervision using BUEs. Worked on blocked practice of squat pivot transfers, w/c to/from chair w/ arm rests. Verbal and visual cues for technique, mod assist overall w/ last 2-3 transfers w/ mod-max assist 2/2 fatigue. Needed assist primarily to boost bottom over armrests. Ended session in w/c and in care of staff in gym, all needs met.   Therapy Documentation Precautions:  Precautions Precautions: Fall Restrictions Weight Bearing Restrictions: Yes RLE Weight Bearing: Non weight bearing Pain: Pain Assessment Pain Scale: 0-10 Pain Score: 0-No pain  Therapy/Group: Individual Therapy  Mattheus Rauls K Nylan Nevel 11/30/2018, 10:13 AM

## 2018-11-30 NOTE — Progress Notes (Signed)
Social Work Patient ID: Jeremiah [name], male   DOB: 04/20/1946, 73 y.o.   MRN: [id number] Spoke with wife to discus and arrange family education. Wife is aware how much car ept will need and still wants to try to get him home. Ramp is being built and she has her brother, pt's son and pt;s brother to assist her also with him. She is aware of the alternatives and wants to try it at home first. Have scheduled them to come in Thursday 1/9 @ 10:00-12:00.

## 2018-11-30 NOTE — Progress Notes (Signed)
[place] PHYSICAL MEDICINE & REHABILITATION PROGRESS NOTE  Subjective/Complaints: Patient seen this morning.  He states he slept well overnight.  He notes improvement in strength in left lower extremity.  ROS: Denies CP, shortness of breath, nausea, vomiting.  Objective: Vital Signs: Blood pressure (!) 122/54, pulse 66, temperature 98.4 F (36.9 C), temperature source Oral, resp. rate 14, height 5\' 9"  (1.753 m), weight 68.5 kg, SpO2 100 %. No results found. No results for input(s): WBC, HGB, HCT, PLT in the last 72 hours. No results for input(s): NA, K, CL, CO2, GLUCOSE, BUN, CREATININE, CALCIUM in the last 72 hours.  Physical Exam: BP (!) 122/54 (BP Location: Left Arm)   Pulse 66   Temp 98.4 F (36.9 C) (Oral)   Resp 14   Ht 5\' 9"  (1.753 m)   Wt 68.5 kg   SpO2 100%   BMI 22.30 kg/m  Constitutional: No distress . Vital signs reviewed. HENT: Normocephalic.  Atraumatic. Eyes: EOMI. No discharge. Cardiovascular: RRR.  No JVD. Respiratory: CTA bilaterally.  Normal effort. GI: BS +. Non-distended. Musc: Right AKA Neurological: He is alert.  Some delay in processing, stable.  Follows commands. Motor: Bilateral upper extremities: 4/5 proximal distal, stable Right lower extremity: Hip flexion 4+/5, stable Left lower extremity: Hip flexion 3+/5, knee extension 4-/5, ankle dorsiflexion 4/5 Skin: Right AKA with shrinker C/D/I Psychiatric: His affect is blunt. His speech is delayed. He is slowed.   Assessment/Plan: 1. 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  Physiatrist is providing close team supervision and 24 hour management of active medical problems listed below.  Physiatrist and rehab team continue to assess barriers to discharge/monitor patient progress toward functional and medical goals  Care Tool:  Bathing  Bathing activity did not occur: Refused Body parts  bathed by patient: Right arm, Left arm, Chest, Abdomen, Front perineal area, Right upper leg, Left lower leg, Face, Left upper leg   Body parts bathed by helper: Buttocks Body parts n/a: Right lower leg   Bathing assist Assist Level: Minimal Assistance - Patient > 75%     Upper Body Dressing/Undressing Upper body dressing   What is the patient wearing?: Pull over shirt    Upper body assist Assist Level: Contact Guard/Touching assist    Lower Body Dressing/Undressing Lower body dressing      What is the patient wearing?: Pants, Incontinence brief, Ace wrap/stump shrinker     Lower body assist Assist for lower body dressing: Contact Guard/Touching assist     Toileting Toileting    Toileting assist Assist for toileting: 2 Helpers     Transfers Chair/bed transfer  Transfers assist  Chair/bed transfer activity did not occur: (sliding board)  Chair/bed transfer assist level: Moderate Assistance - Patient 50 - 74%     Locomotion Ambulation   Ambulation assist   Ambulation activity did not occur: Safety/medical concerns          Walk 10 feet activity   Assist  Walk 10 feet activity did not occur: Safety/medical concerns        Walk 50 feet activity   Assist Walk 50 feet with 2 turns activity did not occur: Safety/medical concerns         Walk 150 feet activity   Assist Walk 150 feet activity did not occur: Safety/medical concerns         Walk 10 feet on uneven surface  activity   Assist Walk  10 feet on uneven surfaces activity did not occur: Safety/medical concerns         Wheelchair     Assist Will patient use wheelchair at discharge?: Yes Type of Wheelchair: Manual    Wheelchair assist level: Supervision/Verbal cueing Max wheelchair distance: 200    Wheelchair 50 feet with 2 turns activity    Assist        Assist Level: Supervision/Verbal cueing   Wheelchair 150 feet activity     Assist Wheelchair 150 feet  activity did not occur: Safety/medical concerns   Assist Level: Supervision/Verbal cueing      Medical Problem List and Plan: 1.  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  DVT Prophylaxis/Anticoagulation: Subcutaneous heparin. Monitor for any bleeding episodes 3. Pain Management:  Percocet as needed  Controlled on 1/6 4. Mood:  Provide emotional support 5. Neuropsych: This patient is not fully capable of making decisions on his own behalf. 6. Skin/Wound Care:  Routine skin checks 7. Fluids/Electrolytes/Nutrition/hypokalemia:  Routine ins and outs   BMP within acceptable range on 1/3, except for glucose 8. Acute blood loss anemia. Continue iron supplement  Hemoglobin 9.0 on 1/3  Continue to monitor 9. Hypertension.  Norvasc 10 mg daily, changed to nightly on 1/3  Cozaar 100 mg daily, Toprol-XL 50 mg daily.  Vitals:   11/29/18 1926 11/30/18 0436  BP: (!) 145/54 (!) 122/54  Pulse: 68 66  Resp: 16 14  Temp: 98.4 F (36.9 C)   SpO2: 98% 100%   Labile on 1/7 10. Diabetes mellitus with peripheral neuropathy. Hemoglobin A1c 6.7.SSI. Patient on Amaryl 4 mg twice a day prior to admission. Resume as needed  Amaryl 1 mg started on 1/2 CBG (last 3)  Recent Labs    11/29/18 1640 11/29/18 2120 11/30/18 0439  GLUCAP 147* 186* 141*   Labile on 1/7 11. Hyperlipidemia. Zocor 12.  Hypoalbuminemia  Supplement initiated on 12/25 13.  Leukocytosis-likely C Diff- now resolved   Afebrile  WBCs 8.2 on 1/3  Continue to monitor 14.  C. difficile  Fiber added on 12/26, increased on 12/28  Probiotic started on 12/28  Dificid 200 mg twice daily x10 days started on 12/29  LOS: 15 days A FACE TO FACE EVALUATION WAS PERFORMED  Ankit Karis Jubanil Patel 11/30/2018, 9:23 AM

## 2018-11-30 NOTE — Progress Notes (Signed)
Occupational Therapy Weekly Progress Note  Patient Details  Name: Jeremiah [name] MRN: [id number] Date of Birth: [birth year]  Beginning of progress report period: November 23, 2018 End of progress report period: November 30, 2018  Today's Date: 11/30/2018 OT Individual Time: 0835-0900 and 1130-1200 and 1300-1330 OT Individual Time Calculation (min): 25 min and 30 min and 30 min   Patient has met 2 of 3 short term goals.  Pt cont to make slow but steady progress towards OT goals. He currently requires min-mod A for sliding board transfers and mod A for squat pivot transfers. Attempting to limit number of sliding board transfers 2/2 skin breakdown on buttock, however, unsure at this time if family will be able to provide the mod A pt requires for squat pivot transfers.  Pt currently requires total A +2 for toileting task on BSC as pt fatigues quickly with tasks and requires increased assist when fatigued and required to complete all steps of toilteing task.  He completed UB bathing/dressing from EOB or w/c at sink with supervision/ set-up. Requires VCs for problem solving and min-mod A for LB dressing depending on positioning and fatigue. Able to complete sit>stand from elevated EOB with mod A +1 in order to have pants pulled up with total A.  Therapist has spoken with pt's wife on phone and discussed with CSW need for family to come in for extensive hands on training prior to planned d/c home as pt requires significant physical assist as well as due to baseline cognitive impairments.   Patient continues to demonstrate the following deficits: muscle weakness, decreased cardiorespiratoy endurance, decreased problem solving and decreased sitting balance, decreased standing balance, decreased postural control and decreased balance strategies and therefore will continue to benefit from skilled OT intervention to enhance overall performance with BADL and Reduce care partner burden.  Patient not progressing  toward long term goals.  See goal revision..  Plan of care revisions: Goals downgraded to mod A overall due to slow pt progress. .  OT Short Term Goals Week 2:  OT Short Term Goal 1 (Week 2): Pt will complete sit>stand at RW during LB dressing taask with mod A+1 OT Short Term Goal 1 - Progress (Week 2): Met OT Short Term Goal 2 (Week 2): Pt will complete toilet/BSC transfer with mod A OT Short Term Goal 2 - Progress (Week 2): Met OT Short Term Goal 3 (Week 2): Pt will complete toileting task with max A +1 in order to reduce caregiver burden OT Short Term Goal 3 - Progress (Week 2): Not met Week 3:  OT Short Term Goal 1 (Week 3): STG=LTG due to LOS  Skilled Therapeutic Interventions/Progress Updates:    Session One: Pt seen for OT session focusing on ADL re-training and functional transfer. Pt in supine upon arrival, denied pain and agreeable to tx session. For time and energy conservation, he completed LB dressing from bed level, rolling with VCs for technique and use of hospital bed rails in order to pull pants up. He required mod A for coming to sit EOB. He dressed seated on EOB, requiring VCs for anterior weight shift and min steadying assist for dynamic balance, Pt with several posterior LOB episodes unable to self correct independently.  He completed mod-max A squat pivot transfer from elevated EOB to w/c with cuing for hand placement and head/hip relationship. Pt left seated in w/c at end of session with chair belt alarm donned and RN present.   SEssion Two: Pt seen for OT  session focusing on functional transfers and toileting task. Pt sitting up in w/c upon arrival, denied pain, however, complaints of fatigue from previous tx sessions, willing to participate as able. Completed squat pivot transfer w/c<> standard BSC. Mod A for squat pivot to BSC. He was able to complete lateral leans to complete clothing management with close supervision and VCs. Required max- total A for BSC>w/c transfer  2/2 fatigue.  Spoke with pt's son on telephone during session, scheduled family ed for Wednesday and Thursday. Cont to educate and encourage pt's family to come in for hands on training in prep for d/c home and mod A goals.  Pt left seated in w/c at end of session, chair belt alarm on and set-up with meal tray.   Session Three: Pt seen for OT session focusing on sit>Stand and UE strengthening/endurance. Pt sitting up in w/c upon arrival, cont with complaints of fatigue, willing to participate in session as able, denying pain. He completed x2 sit>stand from w/c to standard walker with mod A, tolerating standing ~30 seconds before requiring seated rest break. He was able to maintain static standing balance with B UE support with guarding assist. Extended seated rest break required btwn trials, UE exercises noted below btwn trials. Completed UE strengthening task utilizing level I (light orange) theraband. Pt able to recall exercises taught in previous session with min cuing, requiring demonstration and tactile cuing for proper form and technique, however. Completed x2 sets of 10 chest expansion, diagonal up, diagonal down, and upright row. Rest break required btwn each exercise.  Pt left seated in w/c at end of session, chair belt alarm on and all needs in reach.   Therapy Documentation Precautions:  Precautions Precautions: Fall Restrictions Weight Bearing Restrictions: Yes RLE Weight Bearing: Non weight bearing Pain:   No/denies pain   Therapy/Group: Individual Therapy  Alizeh Madril L 11/30/2018, 7:07 AM

## 2018-11-30 NOTE — Progress Notes (Signed)
Physical Therapy Session Note  Patient Details  Name: Jeremiah [name] MRN: [id number] Date of Birth: January 01, 1946  Today's Date: 11/30/2018 PT Individual Time: 2574-9355 PT Individual Time Calculation (min): 34 min   Short Term Goals: Week 3:  PT Short Term Goal 1 (Week 3): Patient to consistently perform bed mobility MinA  PT Short Term Goal 2 (Week 3): Patient to consistently perform bed<->chair transfers ModA  PT Short Term Goal 3 (Week 3): Patient to consistently perform car transfer [place]  PT Short Term Goal 4 (Week 3): Patient to be able to tolerate static standing for 5 minutes in order to demonstrate improved L LE strength and improved gross endurance   Skilled Therapeutic Interventions/Progress Updates:    Patient received up in [facility], pleasant but fatigued and willing to participate in therapy; self-propelled WC 164f with S, then taken to day room totalA for time management. Performed 2 bouts of standing in standing frame for increased L LE endurance and strength, also for improved tolerance to standing position with mod cues for upright posture provided. Able to stand 4 minutes twice in standing frame but very fatigued and significant postural limitations. [facility] and he was left up in the chair with seat belt alarm active and all needs otherwise met.   Therapy Documentation Precautions:  Precautions Precautions: Fall Restrictions Weight Bearing Restrictions: Yes RLE Weight Bearing: Non weight bearing General:  Pain: Pain Assessment Pain Scale: 0-10 Pain Score: 0-No pain    Therapy/Group: Individual Therapy  KDeniece ReePT, DPT, CBIS  Supplemental Physical Therapist [facility]   Pager [phone] Rehab Office [phone]  11/30/2018, 3:55 PM

## 2018-11-30 NOTE — Progress Notes (Signed)
Physical Therapy Weekly Progress Note  Patient Details  Name: Jeremiah [name] MRN: [id number] Date of Birth: [birth year]  Beginning of progress report period: November 23, 2018 End of progress report period: November 30, 2018  Today's Date: 11/30/2018 PT Individual Time: 2993-7169 PT Individual Time Calculation (min): 60 min   Patient has met 4 of 7short term goals from the start of his plan of care. Current progress is variable and inconsistent, as sometimes and depending on fatigue levels, patient can perform transfers with ModA of 1 but at other times may require as much as MaxAx1 or ModAx2. Progress may in part be affected by fatigue, poor motor planning, R field cut, and possible short term memory deficits as well as significant sedentary lifestyle at baseline.   Patient continues to demonstrate the following deficits muscle weakness, decreased cardiorespiratoy endurance, decreased coordination and decreased motor planning and decreased sitting balance, decreased standing balance, decreased postural control and decreased balance strategies and therefore will continue to benefit from skilled PT intervention to increase functional independence with mobility.  Patient slowly progressing toward long term goals..  Continue plan of care.  PT Short Term Goals Week 1:  PT Short Term Goal 1 (Week 1): Pt will complete bed mobility with mod A PT Short Term Goal 1 - Progress (Week 1): Met PT Short Term Goal 2 (Week 1): Pt will maintain sitting balance EOB with SBA consistently PT Short Term Goal 2 - Progress (Week 1): Met PT Short Term Goal 3 (Week 1): Pt will propel manual w/c x 100 ft with min A PT Short Term Goal 3 - Progress (Week 1): Met PT Short Term Goal 4 (Week 1): Pt will initiate standing as safe and able PT Short Term Goal 4 - Progress (Week 1): Met Week 2:  PT Short Term Goal 1 (Week 2): Pt will perform bed mobility with minA PT Short Term Goal 1 - Progress (Week 2): Progressing toward  goal PT Short Term Goal 2 (Week 2): Pt will perform transfers modA PT Short Term Goal 2 - Progress (Week 2): Progressing toward goal PT Short Term Goal 3 (Week 2): Pt will perform car transfer modA PT Short Term Goal 3 - Progress (Week 2): Progressing toward goal Week 3:  PT Short Term Goal 1 (Week 3): Patient to consistently perform bed mobility MinA  PT Short Term Goal 2 (Week 3): Patient to consistently perform bed<->chair transfers ModA  PT Short Term Goal 3 (Week 3): Patient to consistently perform car transfer [place]  PT Short Term Goal 4 (Week 3): Patient to be able to tolerate static standing for 5 minutes in order to demonstrate improved L LE strength and improved gross endurance   Skilled Therapeutic Interventions/Progress Updates:    Patient received up in [facility] following last therapy session, pleasant and willing to participate in with this therapist but reporting high levels of fatigue. Able to perform squat-pivot transfer to the left with ModAx1 and SBA of additional person for safety, then worked on mechanics and head-hips sequencing for sit to stand transfer with multiple attempts and Max cues often requiring ModAx2 today come to a full standing position due to poor motor planning and fatigue (only able to maintain standing for 45 seconds with RW), difficulty sequencing. Performed squat pivot transfer to R back into chair with ModAx2 due to difficulty clearing buttocks due to fatigue. Able to self-propel WC with S, and next went to day room where he was placed in standing frame, able to tolerate  standing for 3 minutes in standing frame with Mod cues for upright posture. Self-propelled WC an additional 135f then [facility] and was left up in chair with seat belt alarm active and all other needs met this morning.   Therapy Documentation Precautions:  Precautions Precautions: Fall Restrictions Weight Bearing Restrictions: Yes RLE Weight Bearing: Non weight  bearing General:   Vital Signs:  Pain: Pain Assessment Pain Scale: 0-10 Pain Score: 0-No pain   Therapy/Group: Individual Therapy  KDeniece ReePT, DPT, CBIS  Supplemental Physical Therapist [facility]   Pager [phone] Rehab Office [phone]  11/30/2018, 12:19 PM

## 2018-12-01 ENCOUNTER — Ambulatory Visit (HOSPITAL_COMMUNITY): Payer: Medicare Other

## 2018-12-01 ENCOUNTER — Inpatient Hospital Stay (HOSPITAL_COMMUNITY): Payer: Medicare Other | Admitting: Occupational Therapy

## 2018-12-01 DIAGNOSIS — E871 Hypo-osmolality and hyponatremia: Secondary | ICD-10-CM

## 2018-12-01 DIAGNOSIS — E875 Hyperkalemia: Secondary | ICD-10-CM

## 2018-12-01 DIAGNOSIS — N179 Acute kidney failure, unspecified: Secondary | ICD-10-CM

## 2018-12-01 LAB — BASIC METABOLIC PANEL
Anion gap: 7 (ref 5–15)
BUN: 24 mg/dL — ABNORMAL HIGH (ref 8–23)
CO2: 22 mmol/L (ref 22–32)
Calcium: 9 mg/dL (ref 8.9–10.3)
Chloride: 105 mmol/L (ref 98–111)
Creatinine, Ser: 1.26 mg/dL — ABNORMAL HIGH (ref 0.61–1.24)
GFR calc Af Amer: >60 mL/min (ref >60)
GFR calc non Af Amer: 56 mL/min — ABNORMAL LOW (ref >60)
Glucose, Bld: 120 mg/dL — ABNORMAL HIGH (ref 70–99)
Potassium: 5.5 mmol/L — ABNORMAL HIGH (ref 3.5–5.1)
Sodium: 134 mmol/L — ABNORMAL LOW (ref 135–145)

## 2018-12-01 LAB — GLUCOSE, CAPILLARY
Glucose-Capillary: 119 mg/dL — ABNORMAL HIGH (ref 70–99)
Glucose-Capillary: 140 mg/dL — ABNORMAL HIGH (ref 70–99)
Glucose-Capillary: 101 mg/dL — ABNORMAL HIGH (ref 70–99)
Glucose-Capillary: 137 mg/dL — ABNORMAL HIGH (ref 70–99)

## 2018-12-01 NOTE — Progress Notes (Signed)
Occupational Therapy Session Note  Patient Details  Name: Jeremiah [name] MRN: [id number] Date of Birth: 11-Sep-1946  Today's Date: 12/01/2018 OT Individual Time: 0900-0930 OT Individual Time Calculation (min): 30 min    Short Term Goals: Week 1:  OT Short Term Goal 1 (Week 1): Pt will complete basic transfer with +1 assist in order to decrease caregiver burden OT Short Term Goal 1 - Progress (Week 1): Met OT Short Term Goal 2 (Week 1): Pt will complete sit>stand at sink with +1 assist in prep for ADL task OT Short Term Goal 2 - Progress (Week 1): Progressing toward goal OT Short Term Goal 3 (Week 1): Pt will don pants at bed level with min A OT Short Term Goal 3 - Progress (Week 1): Met Week 2:  OT Short Term Goal 1 (Week 2): Pt will complete sit>stand at RW during LB dressing taask with mod A+1 OT Short Term Goal 1 - Progress (Week 2): Met OT Short Term Goal 2 (Week 2): Pt will complete toilet/BSC transfer with mod A OT Short Term Goal 2 - Progress (Week 2): Met OT Short Term Goal 3 (Week 2): Pt will complete toileting task with max A +1 in order to reduce caregiver burden OT Short Term Goal 3 - Progress (Week 2): Not met Wk 3: STG=LTG due to LOS  Skilled Therapeutic Interventions/Progress Updates:    Pt seen this session to work on functional mobility and strength skills needed for squat pivot transfers. Pt has great difficulty with squat pivot transfers due to difficulty clearing his hips.  Focused on that movement pattern of lifting his hips. Pt worked on forward wt shifts pushing through his arms partially 5x, then another set pushing with increased arm extension 5x, rested 1 min and then continued to repeat for 5 more cycles.  Pt then worked on isometric holds of lifting hips with arms of 5 seconds at a time.  Focused on pt shifting forward not up to get more of a rocking movement forward to lift hips higher and use his LLE.   Pt repeated isometric hold 5x with a rest in between.  He  participated well in following directions but had limited strength out put.  Pt seemed to grasp understanding of shifting wt forward vs trying to lift straight up.  Pt in room with all needs met.   Therapy Documentation Precautions:  Precautions Precautions: Fall Restrictions Weight Bearing Restrictions: Yes RLE Weight Bearing: Non weight bearing  Pain: Pain Assessment Pain Scale: 0-10 Pain Score: 0-No pain  Therapy/Group: Individual Therapy  [place] 12/01/2018, 12:20 PM

## 2018-12-01 NOTE — Patient Care Conference (Signed)
Inpatient RehabilitationTeam Conference and Plan of Care Update Date: 12/01/2018   Time: 2:15 PM    Patient Name: Jeremiah [name]      Medical Record Number: [id number]  Date of Birth: January 22, 1946 Sex: Male         Room/Bed: 4M11C/4M11C-01 Payor Info: Payor: Multimedia programmer / Plan: [employer] MEDICARE / Product Type: *No Product type* /    Admitting Diagnosis: R AKA  Admit Date/Time:  11/15/2018  6:04 PM Admission Comments: No comment available   Primary Diagnosis:  <principal problem not specified> Principal Problem: <principal problem not specified>  Patient Active Problem List   Diagnosis Date Noted  . Hyperkalemia   . Hyponatremia   . Diabetic peripheral neuropathy (HCC)   . Labile blood glucose   . Enteritis due to Clostridium difficile   . Blood glucose abnormal   . Diarrhea   . Benign essential HTN   . Hypoalbuminemia due to protein-calorie malnutrition (HCC)   . Labile blood pressure   . 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 Postoperative pain   . Acute blood loss anemia   . Dyslipidemia   . Type 2 diabetes mellitus with peripheral neuropathy (HCC)   . S/P AKA (above knee amputation) (HCC) 11/12/2018  . Unilateral AKA, right (HCC)   . Hypertensive crisis   . Diabetes mellitus type 2 in nonobese (HCC)   . Pleural effusion 11/08/2018  . Fever   . Open wound of right foot   . Sepsis (HCC) 11/03/2018  . Altered mental status   . Acute cystitis without hematuria   . Leukocytosis   . Pressure injury of skin 07/08/2018  . Clostridium difficile colitis 07/07/2018  . Hypokalemia 07/07/2018  . HLD (hyperlipidemia) 07/07/2018  . Acute diverticulitis 06/04/2018  . Cholecystitis 05/24/2018  . Cholecystitis with cholelithiasis 05/21/2018  . Acute systolic heart failure (HCC) 03/27/2018  . Diabetes mellitus without complication (HCC) 01/30/2018  . Hypertension 01/30/2018  . Acute cholecystitis 01/30/2018  . AKI (acute kidney injury) (HCC) 01/30/2018  .  Normocytic anemia 01/30/2018  . PVD (peripheral vascular disease) (HCC) 09/05/2014    Expected Discharge Date: Expected Discharge Date: 12/07/18  Team Members Present: Physician leading conference: Dr. Maryla Morrow Social Worker Present: Dossie Der, LCSW Nurse Present: Allayne Stack, RN PT Present: Midge Minium, PT OT Present: Amy Rounds, OT SLP Present: Jackalyn Lombard, SLP PPS Coordinator present : Fae Pippin     Current Status/Progress Goal Weekly Team Focus  Medical   Decreased functional mobility secondary to right AKA 11/09/2018  Improve mobility, AKI, electrolytes  See above   Bowel/Bladder   Pt is incontinent B/B. LBM 11/28/2018.  Manage frequency of bowel episodes per orders/medications.  Assist with toileting needs PRN.   Swallow/Nutrition/ Hydration             ADL's   mod-max A squat pivot transfers; mod A toileting; mod A LB dressing; supervision UB bathing/dressing; mod I grooming  Downgraded to mod A overall  ADL re-training, functional transfers, functional activity tolerance, family training, d/c planning   Mobility   S bed mobility, mod assist basic transfers (level), S w/c propulsion  S bed mobility and w/c propulsion; downgraded other goals to : mod assist basic and car transfers,   activity tolerance, pt ed, family ed, transfer training, strengthening, w/c mobility   Communication             Safety/Cognition/ Behavioral Observations            Pain  No complaints of pain.  Remain pain free.  Assess pain Q shift and PRN.   Skin   Pt has a stage 2 to the sacrum and MASD to bilateral butttocks.  Treat skin breakdown per orders.  Assess skin Q shift and PRN.       *See Care Plan and progress notes for long and short-term goals.     Barriers to Discharge  Current Status/Progress Possible Resolutions Date Resolved   Physician    Medical stability     See above  Therapies, optimize DM/HTN meds, encourage fluids, follow labs      Nursing                   PT                    OT                  SLP                SW                Discharge Planning/Teaching Needs:  Wife wants to take pt home will be here tomorrow for education along with brother and pt's son. Aware of the care he will require see how education goes tomorrow.      Team Discussion:  Progressing toward his goals of min-mod wheelchair level. OT has downgraded his goals for ADL's to mod level. Nursing working on stage 2 on sacrum. MD encouraging fluids due to dry. BP and DM controlled now. Ramp being built for home. Family education tomorrow with wife, son and brother.  Revisions to Treatment Plan:  DC 1/14    Continued Need for Acute Rehabilitation Level of Care: The patient requires daily medical management by a physician with specialized training in physical medicine and rehabilitation for the following conditions: Daily direction of a multidisciplinary physical rehabilitation program to ensure safe treatment while eliciting the highest outcome that is of practical value to the patient.: Yes Daily medical management of patient stability for increased activity during participation in an intensive rehabilitation regime.: Yes Daily analysis of laboratory values and/or radiology reports with any subsequent need for medication adjustment of medical intervention for : Post surgical problems;Blood pressure problems;Diabetes problems;Other;Renal problems   I attest that I was present, lead the team conference, and concur with the assessment and plan of the team.   Lucy Chris 12/01/2018, 2:53 PM

## 2018-12-01 NOTE — Progress Notes (Signed)
Occupational Therapy Session Note  Patient Details  Name: LAVIN MCANNALLY MRN: [id number] Date of Birth: 03-05-1946  Today's Date: 12/01/2018 OT Individual Time: 6962-9528 and 1300-1400 OT Individual Time Calculation (min): 70 min and 60 min   Short Term Goals: Week 3:  OT Short Term Goal 1 (Week 3): STG=LTG due to LOS  Skilled Therapeutic Interventions/Progress Updates:    Session One: Pt seen for OT ADL bathing/dressing session. Pt asleep in supine upon arrival, easily awoken and agreeable to tx session, denied pain. He transferred to EOB with supervision and VCs for technique using hospital bed functions. Completed mod A squat pivot transfer to w/c.  He completed bathing/dressing routine from w/c level at sink. UB bathing/dressing and grooming tasks completed with set-up/ mod I level. HE declined completing pericare/buttock hygiene, reporting nursing had assisted with that prior to therapists arrival. He washed LEs with supervision, VCs to lock w/c brakes during functional/ dynamic tasks. HE stood from w/c to standard walker with max A, unable to achieve erect posture and standing balance in order to allow for therapist to assist with clothing management  Cont with education regarding shrinker wear schedule and cleaning/care of shrinker. Pt completed x3 sit>stands in total for pants to be pulled up, max A to stand each trial, mod A to acheieve erect posture. Pt tolerating ~10 seconds in standing each trial before requiring seated rest break. Total A to manage pants. Discussed completing LB dressing from bed level at d/c in order to reduce caregiver burden and for energy conservation. Pt left seated in w/c at end of session, set-up with meal tray with all needs in reach and chair alarm on.   Session Two: Pt seen for OT session focusing on functional transfers, sit>Stand and standing balance. PT stitting up in w/c upon arrival, denying pain and agreeable to tx session. He desired to work on  standing this session.  He self propelled w/c to therapy gym with supervision and VCs for effective propulsion Techniques.  Completed mod-max squat pivot transfer to standard chair. Completed sit>stand to standard walker. Max A to stand and min A standing balance with B UE support, mirror placed for visual feedback. On subsequent trials, pt able to let go of walker with L UE to remove clothes pins placed at headlevel in front of him. Able to maintain standing balance with steadying assist to complete. Completed x3 trials with pt tolerating ~30 seconds-1 minute in standing before requiring seated rest break.  Pt's wife arrived towards end of session, able to see pt standing and very excited regarding his recent progress. Extensive education and discussion with pt's wife regarding role of OT, PLOF vs CLOF, and OT recommendations for transfers and ADLs. Demonstrated to wife squat pivot and stand pivot transfers, pt completing mod A squat pivot to EOM, Min A sliding board transfer back to w/c with significantly increased time.  Will need to cont with extensive education and hands on training prior to d/c home. Family ed. Sessions scheduled for remainder of week. Pt returned to room at end of session, left seated in w/c with all needs in reach and wife present.   Therapy Documentation Precautions:  Precautions Precautions: Fall Restrictions Weight Bearing Restrictions: Yes RLE Weight Bearing: Non weight bearing Pain:     Therapy/Group: Individual Therapy  Airel Magadan L 12/01/2018, 7:10 AM

## 2018-12-01 NOTE — Progress Notes (Signed)
Social Work Patient ID: Jeremiah [name], male   DOB: [birth year], 73 y.o.   MRN: [id number] Met with pt to discuss team conference goals and family coming in tomorrow to learn his care. He reports wife and son have been in to being he training yestersay and today. Will work on discharge needs and see how family education goes tomorrow.

## 2018-12-01 NOTE — Progress Notes (Signed)
[place] PHYSICAL MEDICINE & REHABILITATION PROGRESS NOTE  Subjective/Complaints: Patient seen working with therapies this AM.  He states he slept well overnight and states he feels better.   ROS: Denies CP, shortness of breath, nausea, vomiting.  Objective: Vital Signs: Blood pressure (!) 121/53, pulse 69, temperature 97.9 F (36.6 C), resp. rate 16, height 5\' 9"  (1.753 m), weight 68.5 kg, SpO2 99 %. No results found. No results for input(s): WBC, HGB, HCT, PLT in the last 72 hours. Recent Labs    12/01/18 0559  NA 134*  K 5.5*  CL 105  CO2 22  GLUCOSE 120*  BUN 24*  CREATININE 1.26*  CALCIUM 9.0    Physical Exam: BP (!) 121/53 (BP Location: Right Arm)   Pulse 69   Temp 97.9 F (36.6 C)   Resp 16   Ht 5\' 9"  (1.753 m)   Wt 68.5 kg   SpO2 99%   BMI 22.30 kg/m  Constitutional: No distress . Vital signs reviewed. HENT: Normocephalic.  Atraumatic. Eyes: EOMI. No discharge. Cardiovascular: RRR. No JVD. Respiratory: CTA bilaterally. Normal effort. GI: BS +. Non-distended. Musc: Right AKA Neurological: He is alert.  Some delay in processing, stable.  Follows commands. Motor: Bilateral upper extremities: 4/5 proximal distal, stable Right lower extremity: Hip flexion 4+/5, unchanged Left lower extremity: Hip flexion 3+/5, knee extension 4-/5, ankle dorsiflexion 4/5, unchanged Skin: Right AKA with staples C/D/I Psychiatric: His affect is blunt. His speech is delayed. He is slowed.   Assessment/Plan: 1. 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  Physiatrist is providing close team supervision and 24 hour management of active medical problems listed below.  Physiatrist and rehab team continue to assess barriers to discharge/monitor patient progress toward functional and medical goals  Care Tool:  Bathing  Bathing activity did not occur: Refused Body parts  bathed by patient: Right arm, Left arm, Chest, Abdomen, Front perineal area, Right upper leg, Left lower leg, Face, Left upper leg   Body parts bathed by helper: Buttocks Body parts n/a: Right lower leg   Bathing assist Assist Level: Minimal Assistance - Patient > 75%     Upper Body Dressing/Undressing Upper body dressing   What is the patient wearing?: Pull over shirt    Upper body assist Assist Level: Set up assist    Lower Body Dressing/Undressing Lower body dressing      What is the patient wearing?: Pants, Incontinence brief, Ace wrap/stump shrinker     Lower body assist Assist for lower body dressing: Minimal Assistance - Patient > 75%     Toileting Toileting    Toileting assist Assist for toileting: 2 Helpers     Transfers Chair/bed transfer  Transfers assist  Chair/bed transfer activity did not occur: (sliding board)  Chair/bed transfer assist level: Moderate Assistance - Patient 50 - 74%     Locomotion Ambulation   Ambulation assist   Ambulation activity did not occur: Safety/medical concerns          Walk 10 feet activity   Assist  Walk 10 feet activity did not occur: Safety/medical concerns        Walk 50 feet activity   Assist Walk 50 feet with 2 turns activity did not occur: Safety/medical concerns         Walk 150 feet activity   Assist Walk 150 feet activity did not occur: Safety/medical concerns  Walk 10 feet on uneven surface  activity   Assist Walk 10 feet on uneven surfaces activity did not occur: Safety/medical concerns         Wheelchair     Assist Will patient use wheelchair at discharge?: Yes Type of Wheelchair: Manual    Wheelchair assist level: Supervision/Verbal cueing Max wheelchair distance: 150'    Wheelchair 50 feet with 2 turns activity    Assist        Assist Level: Supervision/Verbal cueing   Wheelchair 150 feet activity     Assist Wheelchair 150 feet activity  did not occur: Safety/medical concerns   Assist Level: Supervision/Verbal cueing      Medical Problem List and Plan: 1.  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  DVT Prophylaxis/Anticoagulation: Subcutaneous heparin. Monitor for any bleeding episodes 3. Pain Management:  Percocet as needed  Controlled on 1/8 4. Mood:  Provide emotional support 5. Neuropsych: This patient is not fully capable of making decisions on his own behalf. 6. Skin/Wound Care:  Routine skin checks 7. Fluids/Electrolytes/Nutrition/hypokalemia:  Routine ins and outs   BMP within acceptable range on 1/3, except for glucose 8. Acute blood loss anemia. Continue iron supplement  Hemoglobin 9.0 on 1/3  Continue to monitor 9. Hypertension.  Norvasc 10 mg daily, changed to nightly on 1/3  Cozaar 100 mg daily, Toprol-XL 50 mg daily.  Vitals:   11/30/18 1919 12/01/18 0538  BP: (!) 130/49 (!) 121/53  Pulse: 67 69  Resp: 14 16  Temp: (!) 97.4 F (36.3 C) 97.9 F (36.6 C)  SpO2: 99% 99%   Relatively controlled on 1/8 10. Diabetes mellitus with peripheral neuropathy. Hemoglobin A1c 6.7.SSI. Patient on Amaryl 4 mg twice a day prior to admission. Resume as needed  Amaryl 1 mg started on 1/2 CBG (last 3)  Recent Labs    11/30/18 1638 11/30/18 2216 12/01/18 0531  GLUCAP 118* 151* 119*   Relatively controlled on 1/8 11. Hyperlipidemia. Zocor 12.  Hypoalbuminemia  Supplement initiated on 12/25 13.  Leukocytosis-likely C Diff- now resolved   Afebrile  WBCs 8.2 on 1/3   Continue to monitor 14.  C. difficile  Fiber added on 12/26, increased on 12/28  Probiotic started on 12/28  Dificid 200 mg twice daily x10 days started on 12/29 15. AKI  Cr 1.26 on 1/8  Encourage fluids  Will consider IVF 16. Hyponatremia  Na 134 on 1/8  Labs ordered for tomorrow 17. Hyperkalemia  K+ 5/5 on 1/8  Labs ordered for tomorrow  LOS: 16 days A FACE TO FACE  EVALUATION WAS PERFORMED  Yomaris Palecek Karis Jubanil Heyli Min 12/01/2018, 11:18 AM

## 2018-12-01 NOTE — Progress Notes (Signed)
Physical Therapy Session Note  Patient Details  Name: Jeremiah [name] MRN: [id number] Date of Birth: 1946-01-03  Today's Date: 12/01/2018 PT Individual Time: 1100-1200 PT Individual Time Calculation (min): 60 min   Short Term Goals:  Week 3:  PT Short Term Goal 1 (Week 3): Patient to consistently perform bed mobility MinA  PT Short Term Goal 2 (Week 3): Patient to consistently perform bed<->chair transfers ModA  PT Short Term Goal 3 (Week 3): Patient to consistently perform car transfer ModA  PT Short Term Goal 4 (Week 3): Patient to be able to tolerate static standing for 5 minutes in order to demonstrate improved L LE strength and improved gross endurance      Skilled Therapeutic Interventions/Progress Updates:   LTGs for basic and car transfer downgraded to mod assist due to limited strength and motor planning.  Son Jeremiah [name] here for family ed.  Pt will d/c in a [place] sedan.  Discussed with Jeremiah [name] and pt head/hips relationship, proper body mechanics for helper, helpful cuing for pt, and side lying bed positioning to facilitate healing of skin on buttocks. Jeremiah [name] stated that there will be room for w/c on either side of hospital bed, at home.  PT demonstrated basic transfer with slide board, mod assist, to move w/c> bed.  Pt rolling L><R with cueing.  Pt needed min assist to sit up on R side of bed.     Jeremiah [name] performed slide board transfer bed> w/c to R, and simulated car transfer in/out with cueing for safety.    W/c propulsion by pt on level tile x 150' with cueing for efficiency and turns.  Pt made 3 point turn in room to position his w/c next to bed, without cueing.    Pt left set up for lunch with needs at hand and seat belt alarm set. Jeremiah [name] present.     Therapy Documentation Precautions:  Precautions Precautions: Fall Restrictions Weight Bearing Restrictions: Yes RLE Weight Bearing: Non weight bearing  Pain: Pain Assessment Pain Scale: 0-10 Pain Score: 0-No pain     Therapy/Group: Individual Therapy  Jeremiah [name] 12/01/2018, 12:15 PM

## 2018-12-02 ENCOUNTER — Encounter (HOSPITAL_COMMUNITY): Payer: BC Managed Care – PPO | Admitting: Occupational Therapy

## 2018-12-02 ENCOUNTER — Inpatient Hospital Stay (HOSPITAL_COMMUNITY): Payer: BC Managed Care – PPO | Admitting: Occupational Therapy

## 2018-12-02 ENCOUNTER — Ambulatory Visit (HOSPITAL_COMMUNITY): Payer: BC Managed Care – PPO

## 2018-12-02 LAB — BASIC METABOLIC PANEL
Anion gap: 7 (ref 5–15)
BUN: 26 mg/dL — ABNORMAL HIGH (ref 8–23)
CO2: 22 mmol/L (ref 22–32)
Calcium: 9 mg/dL (ref 8.9–10.3)
Chloride: 105 mmol/L (ref 98–111)
Creatinine, Ser: 1.28 mg/dL — ABNORMAL HIGH (ref 0.61–1.24)
GFR calc Af Amer: >60 mL/min (ref >60)
GFR calc non Af Amer: 55 mL/min — ABNORMAL LOW (ref >60)
Glucose, Bld: 119 mg/dL — ABNORMAL HIGH (ref 70–99)
Potassium: 5.8 mmol/L — ABNORMAL HIGH (ref 3.5–5.1)
Sodium: 134 mmol/L — ABNORMAL LOW (ref 135–145)

## 2018-12-02 LAB — GLUCOSE, CAPILLARY
Glucose-Capillary: 113 mg/dL — ABNORMAL HIGH (ref 70–99)
Glucose-Capillary: 133 mg/dL — ABNORMAL HIGH (ref 70–99)
Glucose-Capillary: 147 mg/dL — ABNORMAL HIGH (ref 70–99)
Glucose-Capillary: 113 mg/dL — ABNORMAL HIGH (ref 70–99)

## 2018-12-02 MED ORDER — SODIUM POLYSTYRENE SULFONATE 15 GM/60ML PO SUSP
30.0000 g | Freq: Once | ORAL | Status: AC
  Administered 2018-12-02: 30 g via ORAL
  Filled 2018-12-02: qty 120

## 2018-12-02 MED ORDER — SODIUM CHLORIDE 0.9 % IV SOLN
INTRAVENOUS | Status: AC
  Administered 2018-12-02 – 2018-12-04 (×3): via INTRAVENOUS

## 2018-12-02 NOTE — Progress Notes (Addendum)
Social Work Patient ID: Jeremiah [name], male   DOB: [birth year], 73 y.o.   MRN: [id number] Met with pt and wife family education how it went today pt's brother and son were also present but left before meeting with pt and wife. Wife reports it went very well and they feel he can be managed at home. Discussed equipment delivery have faxed into [facility] and have asked them to contact wife per her cell to set up delivery Monday day prior to his discharge. She also has HD that day. The ramp is being completed today at home. Have alerted [facility] for home health resumption of services since they were following prior to admission. Will work on plans for Tuesday. Pt is tired from having therapies back to back, now he can rest. Wife to get walker on her own due to insurance does not cover a wheelchair and a walker

## 2018-12-02 NOTE — Progress Notes (Signed)
Occupational Therapy Session Note  Patient Details  Name: Jeremiah [name] MRN: [id number] Date of Birth: December 07, 1945  Today's Date: 12/02/2018 OT Individual Time: 1027-2536 and 1100-1145 OT Individual Time Calculation (min): 85 min and 45 min OT Missed Time: 15 minutes (pt fatigue)   Short Term Goals: Week 3:  OT Short Term Goal 1 (Week 3): STG=LTG due to LOS  Skilled Therapeutic Interventions/Progress Updates:    Session One: Pt seen for OT ADL bathing/dressing session. Pt sitting up in bed upon arrival, eating breakfast and agreeable to tx session, pt denying pain. Discussed his plan for home AM routine with bathing/dressing, with emphasis/education on energy conservation and reducing caregiver burden. Plan to complete LB bathing/dressing from bed level before transitioning to w/c for UB bathing/dressing and grooming tasks.  He completed LB bathing/dressing today from bed level with set-up and supervision/VCs to use hospital bed functions to assist with bed mobility and importance of effective and efficient mobility methods. With cuing, pt able to roll to pull pants up completely. Required total A to don shrinker.  HE transferred to EOB using hospital bed functions and assist to bring trunk upright. Completed max A squat pivot transfer to w/c. Completed UB bathing/dressing and grooming routine from w/c level at sink with set-up. Discussed at length toileting task and transfers for d/c. Pt reports his wife will assist at with this at d/c. Therefore recommending sliding board transfer to heavy duty [facility] for transfer method. Completed mod-max A sliding board transfer on BSC. He completed modified push-up while therapist completed clothing management total A. Pt with continent BM while on BSC. He was able to complete lateral leans with supervision and cuing in order complete buttock hygiene. Completed modified push-up for therapist to place brief. HE then stood at standard walker with mod A, maintaining  static standing balance with B UE support while therapist pulled pants up. Following seated rest break, he completed min-mod sliding board transfer back to w/c.  Pt left seated in w/c at end of session with all needs in reach.  Session Two: Pt seen for OT session focusing on scheduled family ed session. Pt with hand off from PT, wife, brother and son present for scheduled family ed session.  Completed sliding board transfer w/c<> drop arm BSC. Therapist assisted with mod A transfer on way to [facility]. Pt with complaints of increased fatigue following back to back sessions. Declined completing full toileting task. Pt's wife assisted with transfer back to w/c, required VCs and assist for placing slide board. With increased time, pt completed supervision sliding board transfer to w/c! Following rest break, pt completed x2 sit>stand at standard walker. Therapist assisting on first trial and wife assisting on second trial. Extensive education regarding standing for toileting/clothing management only and absolutely no transfers using walker or any attempts at ambulation.  Pt requesting to stop therapy session 2/2 fatigue, left sitting in w/c with all needs in reach and wife present. Extensive education provided throughout session regarding role of OT, POC, CLOF, therapy goals, modified methods for ADLs, energy conservation, DME, and d/c planning. Pt's wife plans to come for another family ed session with OT on Monday. Scheduled family ed for PT on Friday.  Therapy Documentation Precautions:  Precautions Precautions: Fall Restrictions Weight Bearing Restrictions: Yes RLE Weight Bearing: Non weight bearing Pain:   No/denies pain   Therapy/Group: Individual Therapy  Alric Geise L 12/02/2018, 6:59 AM

## 2018-12-02 NOTE — Progress Notes (Signed)
[facility] PHYSICAL MEDICINE & REHABILITATION PROGRESS NOTE  Subjective/Complaints: Patient seen sitting up in his chair this morning working with therapies.  He states he slept well overnight.  He denies complaints.  ROS: Denies CP, shortness of breath, nausea, vomiting.  Objective: Vital Signs: Blood pressure (!) 133/103, pulse 74, temperature 98.2 F (36.8 C), resp. rate 16, height 5\' 9"  (1.753 m), weight 68.5 kg, SpO2 100 %. No results found. No results for input(s): WBC, HGB, HCT, PLT in the last 72 hours. Recent Labs    12/01/18 0559 12/02/18 0604  NA 134* 134*  K 5.5* 5.8*  CL 105 105  CO2 22 22  GLUCOSE 120* 119*  BUN 24* 26*  CREATININE 1.26* 1.28*  CALCIUM 9.0 9.0    Physical Exam: BP (!) 133/103 (BP Location: Left Arm)   Pulse 74   Temp 98.2 F (36.8 C)   Resp 16   Ht 5\' 9"  (1.753 m)   Wt 68.5 kg   SpO2 100%   BMI 22.30 kg/m  Constitutional: No distress . Vital signs reviewed. HENT: Normocephalic.  Atraumatic. Eyes: EOMI. No discharge. Cardiovascular: RRR.  No JVD. Respiratory: CTA bilaterally.  Normal effort. GI: BS +. Non-distended. Musc: Right AKA Neurological: He is alert.  HOH Some delay in processing, stable.  Follows commands. Motor: Bilateral upper extremities: 4+/5 proximal distal Right lower extremity: Hip flexion 4+/5, unchanged Left lower extremity: Hip flexion 3+/5, knee extension 4-/5, ankle dorsiflexion 4/5, improving Skin: Right AKA with staples C/D/I Psychiatric: His affect is blunt. His speech is delayed. He is slowed.   Assessment/Plan: 1. 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  Physiatrist is providing close team supervision and 24 hour management of active medical problems listed below.  Physiatrist and rehab team continue to assess barriers to discharge/monitor patient progress toward functional and medical  goals  Care Tool:  Bathing  Bathing activity did not occur: Refused Body parts bathed by patient: Right arm, Left arm, Chest, Abdomen, Front perineal area, Right upper leg, Left lower leg, Face, Left upper leg   Body parts bathed by helper: Buttocks Body parts n/a: Right lower leg   Bathing assist Assist Level: Minimal Assistance - Patient > 75%     Upper Body Dressing/Undressing Upper body dressing   What is the patient wearing?: Pull over shirt    Upper body assist Assist Level: Set up assist    Lower Body Dressing/Undressing Lower body dressing      What is the patient wearing?: Pants, Incontinence brief, Ace wrap/stump shrinker     Lower body assist Assist for lower body dressing: Minimal Assistance - Patient > 75%     Toileting Toileting    Toileting assist Assist for toileting: 2 Helpers     Transfers Chair/bed transfer  Transfers assist  Chair/bed transfer activity did not occur: (sliding board)  Chair/bed transfer assist level: Moderate Assistance - Patient 50 - 74%     Locomotion Ambulation   Ambulation assist   Ambulation activity did not occur: Safety/medical concerns          Walk 10 feet activity   Assist  Walk 10 feet activity did not occur: Safety/medical concerns        Walk 50 feet activity   Assist Walk 50 feet with 2 turns activity did not occur: Safety/medical concerns         Walk 150 feet activity   Assist Walk  150 feet activity did not occur: Safety/medical concerns         Walk 10 feet on uneven surface  activity   Assist Walk 10 feet on uneven surfaces activity did not occur: Safety/medical concerns         Wheelchair     Assist Will patient use wheelchair at discharge?: Yes Type of Wheelchair: Manual    Wheelchair assist level: Supervision/Verbal cueing Max wheelchair distance: 150'    Wheelchair 50 feet with 2 turns activity    Assist        Assist Level: Supervision/Verbal  cueing   Wheelchair 150 feet activity     Assist Wheelchair 150 feet activity did not occur: Safety/medical concerns   Assist Level: Supervision/Verbal cueing      Medical Problem List and Plan: 1.  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  DVT Prophylaxis/Anticoagulation: Subcutaneous heparin. Monitor for any bleeding episodes 3. Pain Management:  Percocet as needed  Controlled on 1/9 4. Mood:  Provide emotional support 5. Neuropsych: This patient is not fully capable of making decisions on his own behalf. 6. Skin/Wound Care:  Routine skin checks 7. Fluids/Electrolytes/Nutrition/hypokalemia:  Routine ins and outs   BMP within acceptable range on 1/3, except for glucose 8. Acute blood loss anemia. Continue iron supplement  Hemoglobin 9.0 on 1/3  Continue to monitor 9. Hypertension.  Norvasc 10 mg daily, changed to nightly on 1/3  Cozaar 100 mg daily, Toprol-XL 50 mg daily.  Vitals:   12/01/18 2128 12/02/18 0400  BP: 129/64 (!) 133/103  Pulse: 67 74  Resp: 17 16  Temp: 98.3 F (36.8 C) 98.2 F (36.8 C)  SpO2: 99% 100%   Relatively controlled on 1/9 10. Diabetes mellitus with peripheral neuropathy. Hemoglobin A1c 6.7.SSI. Patient on Amaryl 4 mg twice a day prior to admission. Resume as needed  Amaryl 1 mg started on 1/2 CBG (last 3)  Recent Labs    12/01/18 1653 12/01/18 2138 12/02/18 0422  GLUCAP 101* 137* 113*   Relatively controlled on 1/9 11. Hyperlipidemia. Zocor 12.  Hypoalbuminemia  Supplement initiated on 12/25 13.  Leukocytosis-likely C Diff- now resolved   Afebrile  WBCs 8.2 on 1/3   Continue to monitor 14.  C. difficile  Fiber added on 12/26, increased on 12/28  Probiotic started on 12/28  Dificid 200 mg twice daily x10 days started on 12/29 15. AKI  Cr 1.28 on 1/9  Encourage fluids  Will order IVF, echo reviewed 16. Hyponatremia  Na 134 on 1/9 17. Hyperkalemia  K+ 5.8 on  1/9  Kayexalate ordered  LOS: 17 days A FACE TO FACE EVALUATION WAS PERFORMED  Ndidi Nesby Karis Juba 12/02/2018, 10:51 AM

## 2018-12-02 NOTE — Progress Notes (Signed)
Physical Therapy Session Note  Patient Details  Name: Jeremiah [name] MRN: [id number] Date of Birth: 1946/03/13  Today's Date: 12/02/2018 PT Individual Time: 1003-1100 PT Individual Time Calculation (min): 57 min   Short Term Goals: Week 3:  PT Short Term Goal 1 (Week 3): Patient to consistently perform bed mobility MinA  PT Short Term Goal 2 (Week 3): Patient to consistently perform bed<->chair transfers ModA  PT Short Term Goal 3 (Week 3): Patient to consistently perform car transfer ModA  PT Short Term Goal 4 (Week 3): Patient to be able to tolerate static standing for 5 minutes in order to demonstrate improved L LE strength and improved gross endurance      Skilled Therapeutic Interventions/Progress Updates:  Tx focused on family ed with pt's wife Jeremiah [name], son Jeremiah [name], and brother Jeremiah [name].  Jeremiah [name] observed and offered cueing to family.  Jeremiah [name] performed basic transfer and transfer into car; Jeremiah [name] performed basic transfer and transfer out of car; all with slide board.  Transfers focused on sequencing, head/hips relationship, avoiding bumping residual limb, and optimal positioning of helper.  Pt does not remember sequencing of transfer accurately. Family were also trained in folding w/c and managing w/c parts.  Pt propelled w/c x 100' on level tile with supervision.  Pt left resting in w/c with family present, awaiting next session with Amy, OT.   Jeremiah [name] would benefit from another session.     Therapy Documentation Precautions:  Precautions Precautions: Fall Restrictions Weight Bearing Restrictions: Yes RLE Weight Bearing: Non weight bearing Pain: pt denies         Therapy/Group: Individual Therapy  Rachana Malesky 12/02/2018, 12:22 PM

## 2018-12-02 NOTE — Discharge Instructions (Signed)
Inpatient Rehab Discharge Instructions  Jeremiah [name] [facility] Discharge date and time: No discharge date for patient encounter.   Activities/Precautions/ Functional Status: Activity: activity as tolerated Diet: diabetic diet Wound Care: keep wound clean and dry Functional status:  ___ No restrictions     ___ Walk up steps independently ___ 24/7 supervision/assistance   ___ Walk up steps with assistance ___ Intermittent supervision/assistance  ___ Bathe/dress independently ___ Walk with walker     _x__ Bathe/dress with assistance ___ Walk Independently    ___ Shower independently ___ Walk with assistance    ___ Shower with assistance ___ No alcohol     ___ Return to work/school ________  Special Instructions:     COMMUNITY REFERRALS UPON DISCHARGE:    Home Health:   PT, OT, RN, AIDE   Agency:[facility] [phone]   Date of last service:12/07/2018  Medical Equipment/Items Ordered:HOSPITAL BED, WHEELCHAIR, WIDE DROP-ARM BEDSIDE COMMODE, 30 TRANSFER BOARD AND WIFE TO GET WALKER ON HER OWN.  Agency/Supplier:[facility]   [phone]   GENERAL COMMUNITY RESOURCES FOR PATIENT/FAMILY: Support Groups:[facility] THE SECOND Thursday @ 7:00-8:30 PM IN THE HEART AND VASCULAR CENTER [id number] QUESTIONS CONTACT ROBIN [phone]   My questions have been answered and I understand these instructions. I will adhere to these goals and the provided educational materials after my discharge from the hospital.  Patient/Caregiver Signature _______________________________ Date __________  Clinician Signature _______________________________________ Date __________  Please bring this form and your medication list with you to all your follow-up doctor's appointments.

## 2018-12-03 ENCOUNTER — Inpatient Hospital Stay (HOSPITAL_COMMUNITY): Payer: BC Managed Care – PPO | Admitting: Occupational Therapy

## 2018-12-03 ENCOUNTER — Ambulatory Visit (HOSPITAL_COMMUNITY): Payer: BC Managed Care – PPO | Admitting: Physical Therapy

## 2018-12-03 ENCOUNTER — Encounter (HOSPITAL_COMMUNITY): Payer: BC Managed Care – PPO | Admitting: Occupational Therapy

## 2018-12-03 ENCOUNTER — Inpatient Hospital Stay (HOSPITAL_COMMUNITY): Payer: BC Managed Care – PPO | Admitting: Physical Therapy

## 2018-12-03 LAB — GLUCOSE, CAPILLARY
Glucose-Capillary: 107 mg/dL — ABNORMAL HIGH (ref 70–99)
Glucose-Capillary: 141 mg/dL — ABNORMAL HIGH (ref 70–99)
Glucose-Capillary: 158 mg/dL — ABNORMAL HIGH (ref 70–99)
Glucose-Capillary: 129 mg/dL — ABNORMAL HIGH (ref 70–99)

## 2018-12-03 LAB — BASIC METABOLIC PANEL
Anion gap: 8 (ref 5–15)
BUN: 22 mg/dL (ref 8–23)
CO2: 24 mmol/L (ref 22–32)
Calcium: 8.8 mg/dL — ABNORMAL LOW (ref 8.9–10.3)
Chloride: 105 mmol/L (ref 98–111)
Creatinine, Ser: 1.17 mg/dL (ref 0.61–1.24)
GFR calc Af Amer: >60 mL/min (ref >60)
GFR calc non Af Amer: >60 mL/min (ref >60)
Glucose, Bld: 112 mg/dL — ABNORMAL HIGH (ref 70–99)
Potassium: 4 mmol/L (ref 3.5–5.1)
Sodium: 137 mmol/L (ref 135–145)

## 2018-12-03 NOTE — Plan of Care (Signed)
  Problem: RH SKIN INTEGRITY Goal: RH STG SKIN FREE OF INFECTION/BREAKDOWN Description Manage skin with min assist   Outcome: Progressing Goal: RH STG MAINTAIN SKIN INTEGRITY WITH ASSISTANCE Description STG Maintain Skin Integrity With  Cues/reminders Assistance.   Outcome: Progressing

## 2018-12-03 NOTE — Progress Notes (Signed)
[place] PHYSICAL MEDICINE & REHABILITATION PROGRESS NOTE  Subjective/Complaints: Patient seen sitting up in his chair working with therapies this morning.  States he slept well overnight.  Processing continues to be slow.  ROS: Denies CP, shortness of breath, nausea, vomiting.  Objective: Vital Signs: Blood pressure (!) 130/52, pulse 69, temperature 98.1 F (36.7 C), temperature source Oral, resp. rate 16, height 5\' 9"  (1.753 m), weight 68.5 kg, SpO2 99 %. No results found. No results for input(s): WBC, HGB, HCT, PLT in the last 72 hours. Recent Labs    12/02/18 0604 12/03/18 0725  NA 134* 137  K 5.8* 4.0  CL 105 105  CO2 22 24  GLUCOSE 119* 112*  BUN 26* 22  CREATININE 1.28* 1.17  CALCIUM 9.0 8.8*    Physical Exam: BP (!) 130/52 (BP Location: Right Arm)   Pulse 69   Temp 98.1 F (36.7 C) (Oral)   Resp 16   Ht 5\' 9"  (1.753 m)   Wt 68.5 kg   SpO2 99%   BMI 22.30 kg/m  Constitutional: No distress . Vital signs reviewed. HENT: Normocephalic.  Atraumatic. Eyes: EOMI. No discharge. Cardiovascular: RRR.  No JVD. Respiratory: CTA bilaterally.  Normal effort. GI: BS +. Non-distended. Musc: Right AKA Neurological: He is alert.  HOH Some delay in processing, stable.  Follows commands. Motor: Bilateral upper extremities: 4+/5 proximal distal Right lower extremity: Hip flexion 4+/5, stable Left lower extremity: Hip flexion 3+/5, knee extension 4-/5, ankle dorsiflexion 4/5, stable Skin: Right AKA with dressing C/D/I Psychiatric: His affect is blunt. His speech is delayed. He is slowed.   Assessment/Plan: 1. 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  Physiatrist is providing close team supervision and 24 hour management of active medical problems listed below.  Physiatrist and rehab team continue to assess barriers to discharge/monitor patient progress toward  functional and medical goals  Care Tool:  Bathing  Bathing activity did not occur: Refused Body parts bathed by patient: Right arm, Left arm, Chest, Abdomen, Front perineal area, Right upper leg, Left lower leg, Face, Left upper leg   Body parts bathed by helper: Buttocks Body parts n/a: Right lower leg   Bathing assist Assist Level: Minimal Assistance - Patient > 75%     Upper Body Dressing/Undressing Upper body dressing   What is the patient wearing?: Pull over shirt    Upper body assist Assist Level: Set up assist    Lower Body Dressing/Undressing Lower body dressing      What is the patient wearing?: Pants, Incontinence brief, Ace wrap/stump shrinker     Lower body assist Assist for lower body dressing: Minimal Assistance - Patient > 75%     Toileting Toileting    Toileting assist Assist for toileting: 2 Helpers     Transfers Chair/bed transfer  Transfers assist  Chair/bed transfer activity did not occur: (sliding board)  Chair/bed transfer assist level: Maximal Assistance - Patient 25 - 49%     Locomotion Ambulation   Ambulation assist   Ambulation activity did not occur: Safety/medical concerns          Walk 10 feet activity   Assist  Walk 10 feet activity did not occur: Safety/medical concerns        Walk 50 feet activity   Assist Walk 50 feet with 2 turns activity did not occur: Safety/medical concerns         Walk 150 feet  activity   Assist Walk 150 feet activity did not occur: Safety/medical concerns         Walk 10 feet on uneven surface  activity   Assist Walk 10 feet on uneven surfaces activity did not occur: Safety/medical concerns         Wheelchair     Assist Will patient use wheelchair at discharge?: Yes Type of Wheelchair: Manual    Wheelchair assist level: Supervision/Verbal cueing Max wheelchair distance: 150'    Wheelchair 50 feet with 2 turns activity    Assist        Assist  Level: Supervision/Verbal cueing   Wheelchair 150 feet activity     Assist Wheelchair 150 feet activity did not occur: Safety/medical concerns   Assist Level: Supervision/Verbal cueing      Medical Problem List and Plan: 1.  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  DVT Prophylaxis/Anticoagulation: Subcutaneous heparin. Monitor for any bleeding episodes 3. Pain Management:  Percocet as needed  Controlled on 1/9 4. Mood:  Provide emotional support 5. Neuropsych: This patient is not fully capable of making decisions on his own behalf. 6. Skin/Wound Care:  Routine skin checks 7. Fluids/Electrolytes/Nutrition/hypokalemia:  Routine ins and outs   BMP within acceptable range on 1/3, except for glucose 8. Acute blood loss anemia. Continue iron supplement  Hemoglobin 9.0 on 1/3  Labs ordered for Monday  Continue to monitor 9. Hypertension.  Norvasc 10 mg daily, changed to nightly on 1/3   Cozaar 100 mg daily, Toprol-XL 50 mg daily.  Vitals:   12/02/18 2119 12/03/18 0555  BP:  (!) 130/52  Pulse:  69  Resp:    Temp: 98.6 F (37 C) 98.1 F (36.7 C)  SpO2:  99%   Labile on 1/10, monitor for trend 10. Diabetes mellitus with peripheral neuropathy. Hemoglobin A1c 6.7.SSI. Patient on Amaryl 4 mg twice a day prior to admission. Resume as needed  Amaryl 1 mg started on 1/2 CBG (last 3)  Recent Labs    12/02/18 1613 12/02/18 2110 12/03/18 0644  GLUCAP 147* 113* 107*   Relatively controlled on 1/10 11. Hyperlipidemia. Zocor 12.  Hypoalbuminemia  Supplement initiated on 12/25 13.  Leukocytosis-likely C Diff- now resolved   Afebrile  WBCs 8.2 on 1/3   Continue to monitor 14.  C. difficile  Fiber added on 12/26, increased on 12/28  Probiotic started on 12/28  Dificid 200 mg twice daily x10 days started on 12/29 15. AKI  Cr 1.17 on 1/10  Encourage fluids, reminded patient again  Continue IVF x2 more nights 16.  Hyponatremia  Na 137 on 1/10  Labs ordered for Monday  Continue to monitor 17. Hyperkalemia  K+ 4.0 on 1/10 after Kayexalate  Labs ordered for Monday  LOS: 18 days A FACE TO FACE EVALUATION WAS PERFORMED  Elizzie Westergard Karis Juba 12/03/2018, 9:08 AM

## 2018-12-03 NOTE — Progress Notes (Signed)
Right buttocks 1x1cm abrasion, no dressing needed. Need order to remove staples.   Pt would like for lovenox to be discontinued.

## 2018-12-03 NOTE — Progress Notes (Signed)
Occupational Therapy Session Note  Patient Details  Name: WOODRUFF DEGOLIER MRN: [id number] Date of Birth: October 02, 1946  Today's Date: 12/03/2018 OT Individual Time: 0720-0830 OT Individual Time Calculation (min): 70 min    Short Term Goals: Week 3:  OT Short Term Goal 1 (Week 3): STG=LTG due to LOS  Skilled Therapeutic Interventions/Progress Updates:    Pt seen for OT session focusing on ADL re-training and functional transfers. Pt in supine upon arrival, denyying pain and agreeable to tx session. He required mod cuing to recall planned sequencing for dressing task at d/c including LB dressing at bed level and then transitioning to w/c for UB bathing/dressing. LB dressing completed at bed level with supervision/VCs and using hospital bed functions. Instructed in bridging movement as option to assist with getting pants up. He transferred to sitting EOB with min A and VCs for sequencing/technique. Mod-max A squat pivot transfer to w/c. He completed UB bathing/dressing from w/c level at sink with set-up/supervision.  He self propelled w/c to ADL apartment with supervision. Completed kitchen mobility/accessibility task, retrieving items from refrigerator and microwave and transporting items to kitchen table. Completed with supervision with VCs and education being provided throughout.  Pt returned to room at end of session,all needs in reach.   Therapy Documentation Precautions:  Precautions Precautions: Fall Restrictions Weight Bearing Restrictions: Yes RLE Weight Bearing: Non weight bearing Pain:   No/denies pain   Therapy/Group: Individual Therapy  Erikka Follmer L 12/03/2018, 7:00 AM

## 2018-12-03 NOTE — Progress Notes (Signed)
Physical Therapy Session Note  Patient Details  Name: Jeremiah [name] MRN: [id number] Date of Birth: 1946/04/23  Today's Date: 12/03/2018 PT Individual Time: 0910-1005 and 1410-1505 PT Individual Time Calculation (min): 55 min and 55 min    Short Term Goals: Week 3:  PT Short Term Goal 1 (Week 3): Patient to consistently perform bed mobility MinA  PT Short Term Goal 2 (Week 3): Patient to consistently perform bed<->chair transfers ModA  PT Short Term Goal 3 (Week 3): Patient to consistently perform car transfer [place]  PT Short Term Goal 4 (Week 3): Patient to be able to tolerate static standing for 5 minutes in order to demonstrate improved L LE strength and improved gross endurance  Skilled Therapeutic Interventions/Progress Updates:  Session 1 Pt received sitting in WC and agreeable to PT. SB transfer to nustep with set up and supervision assist min cues for improved anterior weight shift and proper UE placement. Nustep endurance training 5 min x 2 min cues for full ROM and proper speed. WC mobility for UE strengthening 2 x 110f with supervision assist from PT and min cues for doorway management. Sit<>[facility] and min cues for proper weight shift and LE placement and Standing tolerance 2 x 30 sec witrh CGA from PT. [facility] returned to room and [facility] in reach and all needs met.    Session 2.  Pt received sitting in WC and agreeable to PT. NT present assessing Vital signs. Wife present for family education. PT treatment focused on WC mobility with 2 x 1525fthrough hall and to weave through 4 cones x 2. Min cues for awareness of LE and how to aviod obstacles. PT treatment alos focused on Family education for car trasnfer. Completed x 2 with set up/supervision assist from PT and set up/supervision from Wife on second attempt. Min-mod cues to wife for safety awareness, SB placement, and how to cue pt to improve motor planning and anterior weight  shift with each lateral scoot. Patient returned to [facility] and [facility] reach and all needs met.          Therapy Documentation Precautions:  Precautions Precautions: Fall Restrictions Weight Bearing Restrictions: Yes RLE Weight Bearing: Non weight bearing Vital Signs: Therapy Vitals Pulse Rate: 70 BP: (!) 126/51 Patient Position (if appropriate): Sitting Oxygen Therapy SpO2: 100 % O2 Device: Room Air Pain:   denies for each session.   Therapy/Group: Individual Therapy  AuLorie Phenix/08/2019, 10:09 AM

## 2018-12-03 NOTE — Plan of Care (Signed)
  Problem: RH BOWEL ELIMINATION Goal: RH STG MANAGE BOWEL WITH ASSISTANCE Description STG Manage Bowel with Min Assistance.   Outcome: Progressing Goal: RH STG MANAGE BOWEL W/MEDICATION W/ASSISTANCE Description STG Manage Bowel with Medication with mod I Assistance.   Outcome: Progressing   Problem: RH BLADDER ELIMINATION Goal: RH STG MANAGE BLADDER WITH ASSISTANCE Description STG Manage Bladder With Min Assistance   Outcome: Progressing   Problem: RH SKIN INTEGRITY Goal: RH STG SKIN FREE OF INFECTION/BREAKDOWN Description Manage skin with min assist   Outcome: Progressing Goal: RH STG MAINTAIN SKIN INTEGRITY WITH ASSISTANCE Description STG Maintain Skin Integrity With  Cues/reminders Assistance.   Outcome: Progressing Goal: RH STG ABLE TO PERFORM INCISION/WOUND CARE W/ASSISTANCE Description STG Able To Perform Incision/Wound Care With min Assistance.   Outcome: Progressing   Problem: RH SAFETY Goal: RH STG ADHERE TO SAFETY PRECAUTIONS W/ASSISTANCE/DEVICE Description STG Adhere to Safety Precautions With Min Assistance/Device.   Outcome: Progressing   Problem: RH PAIN MANAGEMENT Goal: RH STG PAIN MANAGED AT OR BELOW PT'S PAIN GOAL Description At or below level 3  Outcome: Progressing   Problem: RH KNOWLEDGE DEFICIT LIMB LOSS Goal: RH STG INCREASE KNOWLEDGE OF SELF CARE AFTER LIMB LOSS Description Pt will be able to demonstrate care to residual limb/wound care using handouts/reminders with cues  Outcome: Progressing   

## 2018-12-03 NOTE — Progress Notes (Signed)
Social Work Patient ID: Jeremiah [name], male   DOB: [birth year], 73 y.o.   MRN: [id number] [facility] reports pt [employer]Med[employer]are has termed contacted wife to see what insurance they have now and she reports she told [employer] to not switch it. Pt is active with [facility] as of 11/24/2018. [employer] approving coverage for equipment and will contact wife to set up delivery on Monday. Wife to follow up with her insurance man regarding this issue.

## 2018-12-04 ENCOUNTER — Inpatient Hospital Stay (HOSPITAL_COMMUNITY): Payer: BC Managed Care – PPO | Admitting: Occupational Therapy

## 2018-12-04 DIAGNOSIS — E162 Hypoglycemia, unspecified: Secondary | ICD-10-CM

## 2018-12-04 LAB — GLUCOSE, CAPILLARY
Glucose-Capillary: 114 mg/dL — ABNORMAL HIGH (ref 70–99)
Glucose-Capillary: 138 mg/dL — ABNORMAL HIGH (ref 70–99)
Glucose-Capillary: 175 mg/dL — ABNORMAL HIGH (ref 70–99)
Glucose-Capillary: 131 mg/dL — ABNORMAL HIGH (ref 70–99)

## 2018-12-04 NOTE — Progress Notes (Signed)
Occupational Therapy Session Note  Patient Details  Name: Jeremiah [name] MRN: [id number] Date of Birth: 1946-04-24  Today's Date: 12/04/2018 OT Individual Time: 1300-1415 OT Individual Time Calculation (min): 75 min    Short Term Goals: Week 3:  OT Short Term Goal 1 (Week 3): STG=LTG due to LOS  Skilled Therapeutic Interventions/Progress Updates:    Patient in bed upon arrival.  He denies pain and states that he has been looking forward to therapy session so that he can get out of bed. LB dressing:  Pants in bed with min A UB bathing and dressing:  Set up seated in w/c at sink Grooming:  Set up seated in w/c Bed mobility:  Rolling mod I, Supine to SSP min A SB/pivot transfer bed to w/c:  Used SB with emphasis of lifting and placing bottom from place to place vs sliding across transfer board with min A to ensure that he was not sheering.   UB conditioning:  Patient propelled w/c to therapy gym, completed various UE PREs with theraband and UE coordination activity with 3# dowel.  W/c pushups x 10  Patient is pleasant and cooperative, preparing for discharge this coming week.  Son and family present for portion of the session - state that they are pleased with his progress.  Patient returned to room with family at close of therapy session.  Therapy Documentation Precautions:  Precautions Precautions: Fall Restrictions Weight Bearing Restrictions: Yes RLE Weight Bearing: Non weight bearing General:   Vital Signs: Therapy Vitals Temp: 98.8 F (37.1 C) Temp Source: Oral Pulse Rate: 66 Resp: 18 BP: (!) 153/53 Patient Position (if appropriate): Sitting Oxygen Therapy SpO2: 99 % O2 Device: Room Air Pain: Pain Assessment Pain Scale: 0-10 Pain Score: 0-No pain   Therapy/Group: Individual Therapy  Barrie Lyme 12/04/2018, 3:56 PM

## 2018-12-04 NOTE — Significant Event (Signed)
Hypoglycemic Event  CBG: 56  Treatment: 4 0z juice and a snack  Symptoms: None  Follow-up CBG: Time:2224 CBG Result:129  Possible Reasons for Event: Inadequate meal intake and Other: Pt reports not eating dinner  Comments/MD notified:Dr. Allena Katz at 2235    Charlynn Grimes

## 2018-12-04 NOTE — Progress Notes (Signed)
[place] PHYSICAL MEDICINE & REHABILITATION PROGRESS NOTE  Subjective/Complaints: Patient seen laying in bed this morning.  He states he slept well overnight.  He states the sound on his television is not working.  Last night informed by nursing regarding hypoglycemia.  Patient did not eat dinner, discussed importance of consistent dietary intake with patient.  ROS: Denies CP, shortness of breath, nausea, vomiting.  Objective: Vital Signs: Blood pressure (!) 140/52, pulse 62, temperature 98.2 F (36.8 C), temperature source Oral, resp. rate 16, height 5\' 9"  (1.753 m), weight 68.5 kg, SpO2 99 %. No results found. No results for input(s): WBC, HGB, HCT, PLT in the last 72 hours. Recent Labs    12/02/18 0604 12/03/18 0725  NA 134* 137  K 5.8* 4.0  CL 105 105  CO2 22 24  GLUCOSE 119* 112*  BUN 26* 22  CREATININE 1.28* 1.17  CALCIUM 9.0 8.8*    Physical Exam: BP (!) 140/52 (BP Location: Right Arm)   Pulse 62   Temp 98.2 F (36.8 C) (Oral)   Resp 16   Ht 5\' 9"  (1.753 m)   Wt 68.5 kg   SpO2 99%   BMI 22.30 kg/m  Constitutional: No distress . Vital signs reviewed. HENT: Normocephalic.  Atraumatic. Eyes: EOMI. No discharge. Cardiovascular: RRR.  No JVD. Respiratory: CTA bilaterally.  Normal effort. GI: BS +. Non-distended. Musc: Right AKA Neurological: He is alert.  HOA Some delay in processing, unchanged.  Follows commands. Motor: Bilateral upper extremities: 4+/5 proximal distal Right lower extremity: Hip flexion 4+/5, stable Left lower extremity: Hip flexion 3+/5, knee extension 4-/5, ankle dorsiflexion 4/5, stable Skin: Right AKA with dressing C/D/I Psychiatric: His affect is blunt. His speech is delayed. He is slowed.   Assessment/Plan: 1. 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  Physiatrist is providing close team supervision and 24 hour management of  active medical problems listed below.  Physiatrist and rehab team continue to assess barriers to discharge/monitor patient progress toward functional and medical goals  Care Tool:  Bathing  Bathing activity did not occur: Refused Body parts bathed by patient: Right arm, Left arm, Chest, Abdomen, Front perineal area, Right upper leg, Left lower leg, Face, Left upper leg   Body parts bathed by helper: Buttocks Body parts n/a: Right lower leg   Bathing assist Assist Level: Minimal Assistance - Patient > 75%     Upper Body Dressing/Undressing Upper body dressing   What is the patient wearing?: Pull over shirt    Upper body assist Assist Level: Set up assist    Lower Body Dressing/Undressing Lower body dressing      What is the patient wearing?: Pants, Incontinence brief, Ace wrap/stump shrinker     Lower body assist Assist for lower body dressing: Minimal Assistance - Patient > 75%     Toileting Toileting    Toileting assist Assist for toileting: 2 Helpers     Transfers Chair/bed transfer  Transfers assist  Chair/bed transfer activity did not occur: (sliding board)  Chair/bed transfer assist level: Maximal Assistance - Patient 25 - 49%     Locomotion Ambulation   Ambulation assist   Ambulation activity did not occur: Safety/medical concerns          Walk 10 feet activity   Assist  Walk 10 feet activity did not occur: Safety/medical concerns        Walk 50 feet activity   Assist  Walk 50 feet with 2 turns activity did not occur: Safety/medical concerns         Walk 150 feet activity   Assist Walk 150 feet activity did not occur: Safety/medical concerns         Walk 10 feet on uneven surface  activity   Assist Walk 10 feet on uneven surfaces activity did not occur: Safety/medical concerns         Wheelchair     Assist Will patient use wheelchair at discharge?: Yes Type of Wheelchair: Manual    Wheelchair assist level:  Supervision/Verbal cueing Max wheelchair distance: 150'    Wheelchair 50 feet with 2 turns activity    Assist        Assist Level: Supervision/Verbal cueing   Wheelchair 150 feet activity     Assist Wheelchair 150 feet activity did not occur: Safety/medical concerns   Assist Level: Supervision/Verbal cueing      Medical Problem List and Plan: 1.  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  DVT Prophylaxis/Anticoagulation: Subcutaneous heparin. Monitor for any bleeding episodes 3. Pain Management:  Percocet as needed  Controlled on 1/11 4. Mood:  Provide emotional support 5. Neuropsych: This patient is not fully capable of making decisions on his own behalf. 6. Skin/Wound Care:  Routine skin checks 7. Fluids/Electrolytes/Nutrition/hypokalemia:  Routine ins and outs   BMP within acceptable range on 1/10 8. Acute blood loss anemia. Continue iron supplement  Hemoglobin 9.0 on 1/3  Labs ordered for Monday  Continue to monitor 9. Hypertension.  Norvasc 10 mg daily, changed to nightly on 1/3   Cozaar 100 mg daily, Toprol-XL 50 mg daily.  Vitals:   12/03/18 2122 12/04/18 0601  BP: 126/64 (!) 140/52  Pulse:  62  Resp:  16  Temp:  98.2 F (36.8 C)  SpO2:  99%   Slightly elevated this a.m., otherwise relatively controlled 10. Diabetes mellitus with peripheral neuropathy. Hemoglobin A1c 6.7.SSI. Patient on Amaryl 4 mg twice a day prior to admission. Resume as needed  Amaryl 1 mg started on 1/2 CBG (last 3)  Recent Labs    12/03/18 1620 12/03/18 2224 12/04/18 0559  GLUCAP 158* 129* 114*   Labile with hypoglycemia overnight, educated patient on importance of consistent dietary intake. 11. Hyperlipidemia. Zocor 12.  Hypoalbuminemia  Supplement initiated on 12/25 13.  Leukocytosis-likely C Diff- now resolved   Afebrile  WBCs 8.2 on 1/3   Continue to monitor 14.  C. difficile  Fiber added on 12/26,  increased on 12/28  Probiotic started on 12/28  Dificid 200 mg twice daily x10 days started on 12/29 15. AKI  Cr 1.17 on 1/10  Encourage fluids, reminded patient again  Continue IVF x 1 more nights  Labs ordered for Monday 16. Hyponatremia  Na 137 on 1/10  Labs ordered for Monday  Continue to monitor 17. Hyperkalemia  K+ 4.0 on 1/10 after Kayexalate  Labs ordered for Monday  LOS: 19 days A FACE TO FACE EVALUATION WAS PERFORMED  Cris Gibby Karis JubaAnil Arneta Mahmood 12/04/2018, 10:02 AM

## 2018-12-04 NOTE — Plan of Care (Signed)
  Problem: RH BOWEL ELIMINATION Goal: RH STG MANAGE BOWEL WITH ASSISTANCE Description STG Manage Bowel with Min Assistance.   Outcome: Progressing Goal: RH STG MANAGE BOWEL W/MEDICATION W/ASSISTANCE Description STG Manage Bowel with Medication with mod I Assistance.   Outcome: Progressing   Problem: RH BLADDER ELIMINATION Goal: RH STG MANAGE BLADDER WITH ASSISTANCE Description STG Manage Bladder With Min Assistance   Outcome: Progressing   Problem: RH SKIN INTEGRITY Goal: RH STG SKIN FREE OF INFECTION/BREAKDOWN Description Manage skin with min assist   Outcome: Progressing Goal: RH STG MAINTAIN SKIN INTEGRITY WITH ASSISTANCE Description STG Maintain Skin Integrity With  Cues/reminders Assistance.   Outcome: Progressing Goal: RH STG ABLE TO PERFORM INCISION/WOUND CARE W/ASSISTANCE Description STG Able To Perform Incision/Wound Care With min Assistance.   Outcome: Progressing   Problem: RH SAFETY Goal: RH STG ADHERE TO SAFETY PRECAUTIONS W/ASSISTANCE/DEVICE Description STG Adhere to Safety Precautions With Min Assistance/Device.   Outcome: Progressing   Problem: RH PAIN MANAGEMENT Goal: RH STG PAIN MANAGED AT OR BELOW PT'S PAIN GOAL Description At or below level 3  Outcome: Progressing   Problem: RH KNOWLEDGE DEFICIT LIMB LOSS Goal: RH STG INCREASE KNOWLEDGE OF SELF CARE AFTER LIMB LOSS Description Pt will be able to demonstrate care to residual limb/wound care using handouts/reminders with cues  Outcome: Progressing

## 2018-12-05 ENCOUNTER — Inpatient Hospital Stay (HOSPITAL_COMMUNITY): Payer: BC Managed Care – PPO | Admitting: Physical Therapy

## 2018-12-05 LAB — GLUCOSE, CAPILLARY
Glucose-Capillary: 109 mg/dL — ABNORMAL HIGH (ref 70–99)
Glucose-Capillary: 162 mg/dL — ABNORMAL HIGH (ref 70–99)
Glucose-Capillary: 123 mg/dL — ABNORMAL HIGH (ref 70–99)
Glucose-Capillary: 111 mg/dL — ABNORMAL HIGH (ref 70–99)

## 2018-12-05 NOTE — Progress Notes (Signed)
Physical Therapy Session Note  Patient Details  Name: Jeremiah [name] MRN: [id number] Date of Birth: 04-Jul-1946  Today's Date: 12/05/2018 PT Individual Time: 1002-1100 PT Individual Time Calculation (min): 58 min   Short Term Goals: Week 1:  PT Short Term Goal 1 (Week 1): Pt will complete bed mobility with mod A PT Short Term Goal 1 - Progress (Week 1): Met PT Short Term Goal 2 (Week 1): Pt will maintain sitting balance EOB with SBA consistently PT Short Term Goal 2 - Progress (Week 1): Met PT Short Term Goal 3 (Week 1): Pt will propel manual w/c x 100 ft with min A PT Short Term Goal 3 - Progress (Week 1): Met PT Short Term Goal 4 (Week 1): Pt will initiate standing as safe and able PT Short Term Goal 4 - Progress (Week 1): Met  Skilled Therapeutic Interventions/Progress Updates:  Pt was seen bedside in the am. Pt rolled R/L with side rails and S to assist with donning brief. Pt assisted with donning pants. Pt transferred to edge of bed with min A and verbal cues. Pt transferred edge of bed to w/c with sliding board and min A with verbal cues. Pt performed groom task at sink with S. Pt donned shirt and henley with S. PT propelled w/c to and from gym with B UEs and S. In gym treatment focused on strengthening. Pt performed arm chair push ups 3 sets x 5 reps each and 3 sets x 10 reps each hip flex. Pt returned to room and left sitting up in w/c with call bell within reach.   Therapy Documentation Precautions:  Precautions Precautions: Fall Restrictions Weight Bearing Restrictions: Yes RLE Weight Bearing: Non weight bearing General:   Pain: Pain Assessment Pain Scale: 0-10 Pain Score: 0-No pain Faces Pain Scale: No hurt  Therapy/Group: Individual Therapy  Dub Amis 12/05/2018, 12:16 PM

## 2018-12-05 NOTE — Progress Notes (Signed)
[place] PHYSICAL MEDICINE & REHABILITATION PROGRESS NOTE  Subjective/Complaints: Patient seen sitting up in bed this morning.  He states he feels very good this morning.  ROS: Denies CP, shortness of breath, nausea, vomiting.  Objective: Vital Signs: Blood pressure (!) 160/62, pulse 65, temperature 97.8 F (36.6 C), resp. rate 18, height 5\' 9"  (1.753 m), weight 68.5 kg, SpO2 99 %. No results found. No results for input(s): WBC, HGB, HCT, PLT in the last 72 hours. Recent Labs    12/03/18 0725  NA 137  K 4.0  CL 105  CO2 24  GLUCOSE 112*  BUN 22  CREATININE 1.17  CALCIUM 8.8*    Physical Exam: BP (!) 160/62   Pulse 65   Temp 97.8 F (36.6 C)   Resp 18   Ht 5\' 9"  (1.753 m)   Wt 68.5 kg   SpO2 99%   BMI 22.30 kg/m  Constitutional: No distress . Vital signs reviewed. HENT: Normocephalic.  Atraumatic. Eyes: EOMI. No discharge. Cardiovascular: RRR.  No JVD. Respiratory: CTA bilaterally.  Normal effort. GI: BS +. Non-distended. Musc: Right AKA with edema improving Neurological: He is alert.  HOA Some delay in processing, stable.  Follows commands. Motor: Right lower extremity: Hip flexion 4+/5, unchanged Left lower extremity: Hip flexion 3+/5, knee extension 4-/5, ankle dorsiflexion 4/5, unchanged Skin: Right AKA with dressing C/D/I Psychiatric: His affect is blunt. His speech is delayed. He is slowed.   Assessment/Plan: 1. 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  Physiatrist is providing close team supervision and 24 hour management of active medical problems listed below.  Physiatrist and rehab team continue to assess barriers to discharge/monitor patient progress toward functional and medical goals  Care Tool:  Bathing  Bathing activity did not occur: Refused Body parts bathed by patient: Right arm, Left arm, Chest, Abdomen, Front perineal area,  Right upper leg, Left lower leg, Face, Left upper leg   Body parts bathed by helper: Buttocks Body parts n/a: Right lower leg   Bathing assist Assist Level: Minimal Assistance - Patient > 75%     Upper Body Dressing/Undressing Upper body dressing   What is the patient wearing?: Pull over shirt    Upper body assist Assist Level: Set up assist    Lower Body Dressing/Undressing Lower body dressing      What is the patient wearing?: Pants     Lower body assist Assist for lower body dressing: Minimal Assistance - Patient > 75%     Toileting Toileting    Toileting assist Assist for toileting: 2 Helpers     Transfers Chair/bed transfer  Transfers assist  Chair/bed transfer activity did not occur: (sliding board)  Chair/bed transfer assist level: Maximal Assistance - Patient 25 - 49%     Locomotion Ambulation   Ambulation assist   Ambulation activity did not occur: Safety/medical concerns          Walk 10 feet activity   Assist  Walk 10 feet activity did not occur: Safety/medical concerns        Walk 50 feet activity   Assist Walk 50 feet with 2 turns activity did not occur: Safety/medical concerns         Walk 150 feet activity   Assist Walk 150 feet activity did not occur: Safety/medical concerns         Walk 10 feet on uneven surface  activity   Assist Walk  10 feet on uneven surfaces activity did not occur: Safety/medical concerns         Wheelchair     Assist Will patient use wheelchair at discharge?: Yes Type of Wheelchair: Manual    Wheelchair assist level: Supervision/Verbal cueing Max wheelchair distance: 150'    Wheelchair 50 feet with 2 turns activity    Assist        Assist Level: Supervision/Verbal cueing   Wheelchair 150 feet activity     Assist Wheelchair 150 feet activity did not occur: Safety/medical concerns   Assist Level: Supervision/Verbal cueing      Medical Problem List and  Plan: 1.  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  DVT Prophylaxis/Anticoagulation: Subcutaneous heparin. Monitor for any bleeding episodes 3. Pain Management:  Percocet as needed  Controlled on 1/12 4. Mood:  Provide emotional support 5. Neuropsych: This patient is not fully capable of making decisions on his own behalf. 6. Skin/Wound Care:  Routine skin checks 7. Fluids/Electrolytes/Nutrition/hypokalemia:  Routine ins and outs   BMP within acceptable range on 1/10 8. Acute blood loss anemia. Continue iron supplement  Hemoglobin 9.0 on 1/3  Labs ordered for tomorrow  Continue to monitor 9. Hypertension.  Norvasc 10 mg daily, changed to nightly on 1/3   Cozaar 100 mg daily, Toprol-XL 50 mg daily.  Vitals:   12/04/18 2016 12/05/18 0619  BP:  (!) 160/62  Pulse:  65  Resp:  18  Temp: 98.9 F (37.2 C) 97.8 F (36.6 C)  SpO2:  99%   ?  Trending up on 1/12 10. Diabetes mellitus with peripheral neuropathy. Hemoglobin A1c 6.7.SSI. Patient on Amaryl 4 mg twice a day prior to admission. Resume as needed  Amaryl 1 mg started on 1/2 CBG (last 3)  Recent Labs    12/04/18 1656 12/04/18 2232 12/05/18 0633  GLUCAP 175* 131* 109*   Relatively controlled on 1/12 11. Hyperlipidemia. Zocor 12.  Hypoalbuminemia  Supplement initiated on 12/25 13.  Leukocytosis-likely C Diff- now resolved   Afebrile  WBCs 8.2 on 1/3   Continue to monitor 14.  C. difficile  Fiber added on 12/26, increased on 12/28  Probiotic started on 12/28  Dificid 200 mg twice daily x10 days started on 12/29 15. AKI  Cr 1.17 on 1/10  Encourage fluids, reminded patient again  Continue IVF x 1 more nights  Labs ordered for tomorrow 16. Hyponatremia  Na 137 on 1/10  Labs ordered for tomorrow  Continue to monitor 17. Hyperkalemia  K+ 4.0 on 1/10 after Kayexalate  Labs ordered for tomorrow  LOS: 20 days A FACE TO FACE EVALUATION WAS PERFORMED  Ankit  Karis Jubanil Patel 12/05/2018, 8:25 AM

## 2018-12-06 ENCOUNTER — Inpatient Hospital Stay (HOSPITAL_COMMUNITY): Payer: BC Managed Care – PPO

## 2018-12-06 ENCOUNTER — Inpatient Hospital Stay (HOSPITAL_COMMUNITY): Payer: BC Managed Care – PPO | Admitting: Occupational Therapy

## 2018-12-06 LAB — CBC WITH DIFFERENTIAL/PLATELET
Abs Immature Granulocytes: 0.02 10*3/uL (ref 0.00–0.07)
Basophils Absolute: 0.1 10*3/uL (ref 0.0–0.1)
Basophils Relative: 1 %
Eosinophils Absolute: 1.3 10*3/uL — ABNORMAL HIGH (ref 0.0–0.5)
Eosinophils Relative: 14 %
HCT: 25.9 % — ABNORMAL LOW (ref 39.0–52.0)
Hemoglobin: 8.6 g/dL — ABNORMAL LOW (ref 13.0–17.0)
Immature Granulocytes: 0 %
Lymphocytes Relative: 25 %
Lymphs Abs: 2.4 10*3/uL (ref 0.7–4.0)
MCH: 28.3 pg (ref 26.0–34.0)
MCHC: 33.2 g/dL (ref 30.0–36.0)
MCV: 85.2 fL (ref 80.0–100.0)
Monocytes Absolute: 1 10*3/uL (ref 0.1–1.0)
Monocytes Relative: 10 %
Neutro Abs: 5 10*3/uL (ref 1.7–7.7)
Neutrophils Relative %: 50 %
Platelets: 253 10*3/uL (ref 150–400)
RBC: 3.04 MIL/uL — ABNORMAL LOW (ref 4.22–5.81)
RDW: 15.3 % (ref 11.5–15.5)
WBC: 9.9 10*3/uL (ref 4.0–10.5)
nRBC: 0 % (ref 0.0–0.2)

## 2018-12-06 LAB — BASIC METABOLIC PANEL
Anion gap: 5 (ref 5–15)
BUN: 20 mg/dL (ref 8–23)
CO2: 25 mmol/L (ref 22–32)
Calcium: 8.8 mg/dL — ABNORMAL LOW (ref 8.9–10.3)
Chloride: 106 mmol/L (ref 98–111)
Creatinine, Ser: 1.05 mg/dL (ref 0.61–1.24)
GFR calc Af Amer: >60 mL/min (ref >60)
GFR calc non Af Amer: >60 mL/min (ref >60)
Glucose, Bld: 136 mg/dL — ABNORMAL HIGH (ref 70–99)
Potassium: 3.8 mmol/L (ref 3.5–5.1)
Sodium: 136 mmol/L (ref 135–145)

## 2018-12-06 LAB — GLUCOSE, CAPILLARY
Glucose-Capillary: 56 mg/dL — ABNORMAL LOW (ref 70–99)
Glucose-Capillary: 116 mg/dL — ABNORMAL HIGH (ref 70–99)
Glucose-Capillary: 131 mg/dL — ABNORMAL HIGH (ref 70–99)
Glucose-Capillary: 108 mg/dL — ABNORMAL HIGH (ref 70–99)
Glucose-Capillary: 116 mg/dL — ABNORMAL HIGH (ref 70–99)

## 2018-12-06 MED ORDER — BRIMONIDINE TARTRATE 0.2 % OP SOLN: 1.0000 [drp] | Freq: Two times a day (BID) | OPHTHALMIC | 12 refills | Status: AC

## 2018-12-06 MED ORDER — CALCIUM POLYCARBOPHIL 625 MG PO TABS: 1250.0000 mg | ORAL_TABLET | Freq: Every day | ORAL | 1 refills | Status: DC

## 2018-12-06 MED ORDER — TIMOLOL MALEATE 0.5 % OP SOLN: 1.0000 [drp] | Freq: Two times a day (BID) | OPHTHALMIC | 6 refills | Status: AC

## 2018-12-06 MED ORDER — FERROUS SULFATE 325 (65 FE) MG PO TABS: 325.0000 mg | ORAL_TABLET | Freq: Three times a day (TID) | ORAL | 0 refills | Status: DC

## 2018-12-06 MED ORDER — METOPROLOL SUCCINATE ER 25 MG PO TB24: 25.0000 mg | ORAL_TABLET | Freq: Every day | ORAL | 1 refills | Status: DC

## 2018-12-06 MED ORDER — LOSARTAN POTASSIUM 100 MG PO TABS: 100.0000 mg | ORAL_TABLET | Freq: Every day | ORAL | 3 refills | Status: DC

## 2018-12-06 MED ORDER — AMLODIPINE BESYLATE 10 MG PO TABS: 10.0000 mg | ORAL_TABLET | Freq: Every day | ORAL | 0 refills | Status: DC

## 2018-12-06 MED ORDER — ACETAMINOPHEN 325 MG PO TABS: 650.0000 mg | ORAL_TABLET | Freq: Four times a day (QID) | ORAL | Status: DC | PRN

## 2018-12-06 MED ORDER — ATORVASTATIN CALCIUM 20 MG PO TABS: 20.0000 mg | ORAL_TABLET | Freq: Every day | ORAL | 1 refills | Status: AC

## 2018-12-06 MED ORDER — SIMVASTATIN 40 MG PO TABS: 40.0000 mg | ORAL_TABLET | Freq: Every day | ORAL | 1 refills | Status: DC

## 2018-12-06 MED ORDER — GLIMEPIRIDE 1 MG PO TABS: 1.0000 mg | ORAL_TABLET | Freq: Every day | ORAL | 1 refills | Status: AC

## 2018-12-06 NOTE — Progress Notes (Signed)
[facility] PHYSICAL MEDICINE & REHABILITATION PROGRESS NOTE  Subjective/Complaints: Patient seen sitting up this AM, working with OT.  He states he slept well overnight. He is appreciative of his care and looking forward to going home tomorrow.   ROS: Denies CP, shortness of breath, nausea, vomiting.  Objective: Vital Signs: Blood pressure (!) 158/62, pulse 64, temperature 98.6 F (37 C), resp. rate 20, height 5\' 9"  (1.753 m), weight 68.5 kg, SpO2 100 %. No results found. No results for input(s): WBC, HGB, HCT, PLT in the last 72 hours. No results for input(s): NA, K, CL, CO2, GLUCOSE, BUN, CREATININE, CALCIUM in the last 72 hours.  Physical Exam: BP (!) 158/62 (BP Location: Left Arm)   Pulse 64   Temp 98.6 F (37 C)   Resp 20   Ht 5\' 9"  (1.753 m)   Wt 68.5 kg   SpO2 100%   BMI 22.30 kg/m  Constitutional: No distress . Vital signs reviewed. HENT: Normocephalic.  Atraumatic. Eyes: EOMI. No discharge. Cardiovascular: RRR. No JVD. Respiratory: CTA bilaterally. Normal effort. GI: BS +. Non-distended. Musc: Right AKA with edema improving Neurological: He is alert.  HOH Some delay in processing, unchanged.  Follows commands. Motor: Right lower extremity: Hip flexion 4+/5, unchanged Left lower extremity: Hip flexion 4--4/5, knee extension 4-4+/5, ankle dorsiflexion 4+/5 Skin: Right AKA with dressing C/D/I Psychiatric: His affect is blunt. His speech is delayed. He is slowed.   Assessment/Plan: 1. 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  Physiatrist is providing close team supervision and 24 hour management of active medical problems listed below.  Physiatrist and rehab team continue to assess barriers to discharge/monitor patient progress toward functional and medical goals  Care Tool:  Bathing  Bathing activity did not occur: Refused Body parts bathed by patient:  Right arm, Left arm, Chest, Abdomen, Front perineal area, Right upper leg, Left lower leg, Face, Left upper leg, Buttocks   Body parts bathed by helper: Buttocks Body parts n/a: Right lower leg   Bathing assist Assist Level: Supervision/Verbal cueing     Upper Body Dressing/Undressing Upper body dressing   What is the patient wearing?: Pull over shirt    Upper body assist Assist Level: Set up assist    Lower Body Dressing/Undressing Lower body dressing      What is the patient wearing?: Pants     Lower body assist Assist for lower body dressing: Supervision/Verbal cueing     Toileting Toileting    Toileting assist Assist for toileting: Minimal Assistance - Patient > 75%     Transfers Chair/bed transfer  Transfers assist  Chair/bed transfer activity did not occur: (sliding board)  Chair/bed transfer assist level: Minimal Assistance - Patient > 75%     Locomotion Ambulation   Ambulation assist   Ambulation activity did not occur: Safety/medical concerns          Walk 10 feet activity   Assist  Walk 10 feet activity did not occur: Safety/medical concerns        Walk 50 feet activity   Assist Walk 50 feet with 2 turns activity did not occur: Safety/medical concerns         Walk 150 feet activity   Assist Walk 150 feet activity did not occur: Safety/medical concerns         Walk 10 feet on uneven surface  activity   Assist Walk 10 feet on uneven surfaces activity  did not occur: Safety/medical concerns         Wheelchair     Assist Will patient use wheelchair at discharge?: Yes Type of Wheelchair: Manual    Wheelchair assist level: Supervision/Verbal cueing Max wheelchair distance: 150    Wheelchair 50 feet with 2 turns activity    Assist        Assist Level: Supervision/Verbal cueing   Wheelchair 150 feet activity     Assist Wheelchair 150 feet activity did not occur: Safety/medical concerns   Assist  Level: Supervision/Verbal cueing      Medical Problem List and Plan: 1.  Decreased functional mobility secondary to right AKA 11/09/2018  Continue CIR  Stump shrinker   Plan for d/c tomorrow  Will see patient for transitional care management in 1-2 weeks post-discharge 2.  DVT Prophylaxis/Anticoagulation: Subcutaneous heparin. Monitor for any bleeding episodes 3. Pain Management:  Percocet as needed  Controlled on 1/13 4. Mood:  Provide emotional support 5. Neuropsych: This patient is not fully capable of making decisions on his own behalf. 6. Skin/Wound Care:  Routine skin checks 7. Fluids/Electrolytes/Nutrition/hypokalemia:  Routine ins and outs   BMP within acceptable range on 1/10 8. Acute blood loss anemia. Continue iron supplement  Hemoglobin 9.0 on 1/3  Labs pending  Continue to monitor 9. Hypertension.  Norvasc 10 mg daily, changed to nightly on 1/3   Cozaar 100 mg daily, Toprol-XL 50 mg daily.  Vitals:   12/05/18 2136 12/06/18 0450  BP: 128/66 (!) 158/62  Pulse:  64  Resp:  20  Temp:  98.6 F (37 C)  SpO2:  100%   Labile on 1/13 10. Diabetes mellitus with peripheral neuropathy. Hemoglobin A1c 6.7.SSI. Patient on Amaryl 4 mg twice a day prior to admission. Resume as needed  Amaryl 1 mg started on 1/2 CBG (last 3)  Recent Labs    12/05/18 1641 12/05/18 2153 12/06/18 0628  GLUCAP 123* 111* 116*   Relatively controlled on 1/13 11. Hyperlipidemia. Zocor 12.  Hypoalbuminemia  Supplement initiated on 12/25 13.  Leukocytosis-likely C Diff- now resolved   Afebrile  WBCs 8.2 on 1/3   Continue to monitor 14.  C. difficile  Fiber added on 12/26, increased on 12/28  Probiotic started on 12/28  Dificid 200 mg twice daily x10 days started on 12/29 15. AKI  Cr 1.17 on 1/10  Encourage fluids, reminded patient again  Continue IVF x 1 more nights  Labs pending 16. Hyponatremia  Na 137 on 1/10  Labs pending  Continue to monitor 17. Hyperkalemia  K+ 4.0 on 1/10  after Kayexalate  Labs pending  LOS: 21 days A FACE TO FACE EVALUATION WAS PERFORMED  Maleek Craver Karis Juba 12/06/2018, 9:00 AM

## 2018-12-06 NOTE — Progress Notes (Signed)
Occupational Therapy Session Note  Patient Details  Name: Jeremiah [name] MRN: [id number] Date of Birth: Jan 04, 1946  Today's Date: 12/06/2018 OT Individual Time: 1115-5208 and 1300-1345 OT Individual Time Calculation (min): 75 min and 45 min   Short Term Goals: Week 3:  OT Short Term Goal 1 (Week 3): STG=LTG due to LOS  Skilled Therapeutic Interventions/Progress Updates:    Session One: Pt seen for OT ADL session focusing on ADL re-training and functional transfers. Pt eating breakfast in bed upon arrival, denying pain and agreeable to tx session.  He was agreeable to transferring to EOB to finish breakfast. Close supervision with VCs and use of hospital bed functions in order to come sitting EOB. He finished breakfast seated EOB, x2 total posterior LOB episodes back onto bed requiring min A to return to upright position. VCs to promote anterior weight shift which was needed to find BOS.  He returned to supine to dress, completed LB dressing with supervision/VCs, rolling to pull pants up.  Requested toileting task, completed sliding board transfer to drop arm BSC with min A. Lateral leans to pull pants down and complete hygiene with close supervision. PT able to come into modified push-up on BSC in order for therapist to pull pants up. Completed min A sliding board transfer back to w/c. He completed UB bathing/dressing and grooming tasks with set-up at sink. Pt left seated in w/c at end of session, all needs in reach.  Pt cont to require cuing/reminders for task sequencing, unable to recall plan/events discussed in previous sessions to allow for most energy conservative method and for reducing caregiver burden. Education provided throughout session regarding DME, continuum of care, activity progression, and d/c planning.   Session Two: Pt seen for OT session focusing on functional activity tolerance and standing balance/tolerance. Pt sitting up in w/c upon arrival, denying pain and ready for tx  session. He self propelled w/c throughout unit with supervision and VCs for effective propulsion technique.  Completed mod A squat pivot transfer to w/c. He was able to correctly set-up w/c in prep for transfer with 1 VC. He completed x4 sit>stand, standing with standard walker to match playing card on match board. Pt able to maintain static standing balance with 1 UE support and min A for balance. Pt tolerating ~1-2 minutes in standing before requiring seated rest break. Pt requiring min-mod A overall for sit>stand from elevated mat. Pt returned to w/c with max A squat pivot and multi-modal cuing for head/hip relationship.  Pt returned to room at end of session, left seated with all needs in reach.    Therapy Documentation Precautions:  Precautions Precautions: Fall Restrictions Weight Bearing Restrictions: Yes RLE Weight Bearing: Non weight bearing Pain:   No/denies pain   Therapy/Group: Individual Therapy  Shanekia Latella L 12/06/2018, 7:07 AM

## 2018-12-06 NOTE — Progress Notes (Signed)
Occupational Therapy Session Note  Patient Details  Name: Jeremiah [name] MRN: [id number] Date of Birth: August 01, 1946  Today's Date: 12/06/2018 OT Missed Time: 39 Minutes Missed Time Reason: Patient unwilling/refused to participate without medical reason   Short Term Goals: Week 1:  OT Short Term Goal 1 (Week 1): Pt will complete basic transfer with +1 assist in order to decrease caregiver burden OT Short Term Goal 1 - Progress (Week 1): Met OT Short Term Goal 2 (Week 1): Pt will complete sit>stand at sink with +1 assist in prep for ADL task OT Short Term Goal 2 - Progress (Week 1): Progressing toward goal OT Short Term Goal 3 (Week 1): Pt will don pants at bed level with min A OT Short Term Goal 3 - Progress (Week 1): Met Week 2:  OT Short Term Goal 1 (Week 2): Pt will complete sit>stand at RW during LB dressing taask with mod A+1 OT Short Term Goal 1 - Progress (Week 2): Met OT Short Term Goal 2 (Week 2): Pt will complete toilet/BSC transfer with mod A OT Short Term Goal 2 - Progress (Week 2): Met OT Short Term Goal 3 (Week 2): Pt will complete toileting task with max A +1 in order to reduce caregiver burden OT Short Term Goal 3 - Progress (Week 2): Not met Week 3:  OT Short Term Goal 1 (Week 3): STG=LTG due to LOS  Skilled Therapeutic Interventions/Progress Updates:    Pt refused OT this session as his lunch tray had just arrived and he was very worried about his food getting cold.  Pt stated, "I am going home tomorrow and I have had plenty of therapy".    Pt's RN present to set up his meal tray.  Therapy Documentation Precautions:  Precautions Precautions: Fall Restrictions Weight Bearing Restrictions: Yes RLE Weight Bearing: Non weight bearing General: General OT Amount of Missed Time: 30 Minutes Vital Signs:  Pain: Pain Assessment Pain Scale: 0-10 Pain Score: 0-No pain  Therapy/Group: Individual Therapy  Paul Smiths 12/06/2018, 12:21 PM

## 2018-12-06 NOTE — Plan of Care (Signed)
  Problem: RH BOWEL ELIMINATION Goal: RH STG MANAGE BOWEL WITH ASSISTANCE Description STG Manage Bowel with Min Assistance.   Outcome: Progressing Goal: RH STG MANAGE BOWEL W/MEDICATION W/ASSISTANCE Description STG Manage Bowel with Medication with mod I Assistance.   Outcome: Progressing   Problem: RH BLADDER ELIMINATION Goal: RH STG MANAGE BLADDER WITH ASSISTANCE Description STG Manage Bladder With Min Assistance   Outcome: Progressing   Problem: RH SKIN INTEGRITY Goal: RH STG SKIN FREE OF INFECTION/BREAKDOWN Description Manage skin with min assist   Outcome: Progressing

## 2018-12-06 NOTE — Discharge Summary (Addendum)
NAME: Curley SpiceMCADOO, Jahmeer L. MEDICAL RECORD [id number] ACCOUNT [id number] DATE OF BIRTH:[birth year] FACILITY: [facility] LOCATION: [facility] PHYSICIAN:Azha Constantin, MD  DISCHARGE SUMMARY  DATE OF DISCHARGE:  12/07/2018  DISCHARGE DIAGNOSES:   1.  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   2.  Subcutaneous heparin for deep venous thrombosis prophylaxis. 3.  Pain management. 4.  Acute blood loss anemia. 5.  Hypertension. 6.  Diabetes mellitus. 7.  Hyperlipidemia. 8.  Clostridium difficile.  HISTORY OF PRESENT ILLNESS:  This is a 73 year old right-handed male with history of diastolic congestive heart failure, diabetes mellitus, recurrent Clostridium difficile.  Lives with spouse, household ambulator.  Presented 11/04/2018 with ischemic  ulceration, right heel.  Leukocytosis, low-grade fever.  Cranial CT scan negative.  X-rays of right foot showed no evidence of osteomyelitis.  Blood cultures Proteus mirabilis.  Maintained on antibiotic therapy.  Limb was not felt to be salvageable and  underwent right AKA 11/09/2018 per Dr. Lemar LivingsBrandon Cain.  HOSPITAL COURSE:  Pain management.  Acute blood loss anemia 75.  He was transfused.  Hypokalemia with supplement added.  Antibiotic therapy completed 11/11/2018.  Subcutaneous heparin for DVT prophylaxis.  The patient was admitted for comprehensive  rehabilitation program.  PAST MEDICAL HISTORY:  See discharge diagnoses.  SOCIAL HISTORY:  Lives with spouse, household ambulator.  FUNCTIONAL STATUS:  Upon admission to rehab services was total assist general transfers; moderate assist ambulate 1 foot rolling walker; min mod assist with activities of daily living.  PHYSICAL EXAMINATION: VITAL SIGNS:  Blood pressure 181/62, pulse 61, temperature 98, respirations 18. GENERAL:  Alert male in no acute distress. HEENT:  EOMs intact. NECK:  Supple, nontender, no JVD. CARDIOVASCULAR:  Rate controlled. ABDOMEN:  Soft, nontender, good bowel sounds. LUNGS:  Clear to  auscultation without wheeze.  Right AKA site was dressed, appropriately tender.  REHABILITATION HOSPITAL COURSE:  The patient was admitted to inpatient rehabilitation services.  Therapies initiated on a 3-hour daily basis, consisting of physical therapy, occupational therapy and rehabilitation nursing.  The following issues were  addressed:    Pertaining to the patient's right AKA, 11/09/2018, stump shrinker in place.  He would follow up with vascular surgery.  Neurovascular sensation intact.  Subcutaneous heparin for deep venous thrombosis prophylaxis.    Pain management, use of Percocet on a limited basis.    Acute blood loss anemia 75 and stable.    Blood pressure is controlled with Norvasc and Cozaar.    He did have a history of diabetes mellitus.  Hemoglobin A1c 6.7.  He remained on Amaryl.    Zocor for hyperlipidemia.  Recurrent Clostridium difficile.  He had completed a course of antibiotic therapy.  He remained afebrile.  The patient received weekly collaborative interdisciplinary team conferences to discuss estimated length of stay, family teaching, any barriers to discharge.  Transfers edge of bed with minimal assist and verbal cues.  Transfer to edge of bed to  wheelchair with sliding board and minimal assist.  Performed grooming task at sink side with supervision.  Could put on his shirt with supervision.  Propelled his wheelchair supervision.  Activities of daily living and homemaking.  Working with energy  conservation techniques.  He remained pleasant and cooperative throughout his therapies.  Full family teaching completed and planned discharge to home.  DISCHARGE MEDICATIONS:  Included Norvasc 10 mg p.o. at bedtime, aspirin 81 mg p.o. daily, Lipitor 20 mg p.o. daily, Alphagan ophthalmic solution 1 drop both eyes twice daily, Amaryl 1 mg daily, Cozaar 100 mg p.o. daily, Toprol 25 mg  p.o. daily, FiberCon  daily and Timoptic ophthalmic solution 1 drop both eyes twice daily and  using only Tylenol for pain at time of discharge.  DIET:  Diabetic diet.  The patient would follow up with Dr. Maryla MorrowAnkit Penelopi Mikrut at the outpatient rehab service office as advised; Dr. Lemar LivingsBrandon Cain, call for appointment; Dr. Sharyn LullHarwani medical management.  AN/NUANCE D:12/06/2018 T:12/06/2018 JOB:[id number]  Patient seen and examined by me on day of discharge. Maryla MorrowAnkit Preesha Benjamin, MD, ABPMR

## 2018-12-06 NOTE — Progress Notes (Signed)
Social Work Patient ID: Jeremiah [name], male   DOB: 02/20/1946, 72 y.o.   MRN: [id number]  Met with pt and spoke with wife via telephone to make sure equipment got delivered to their home today in preparation for discharge tomorrow. Pt wants wife to be here at 8:00 am tomorrow to go home. Discussed once they arrive the PA will come and go over the discharge instructions with both of them. See in am pt is ready to go home and family is comfortable with his care and has completed education.  

## 2018-12-06 NOTE — Progress Notes (Signed)
Occupational Therapy Discharge Summary  Patient Details  Name: Jeremiah [name] MRN: [id number] Date of Birth: 1946/10/24  Patient has met 9 of 10 long term goals due to improved activity tolerance, improved balance, postural control, ability to compensate for deficits and improved coordination.  Patient to discharge at [facility] Assist- max level.  Patient's care partner is independent to provide the necessary physical assistance at discharge.   Pt is completing sliding board transfers at overall min-mod A level depending on surface height and fatigue level. He can complete mod A squat pivot transfers, recommending only son do this transfer as wife has health conditions of her own. He is completing toileting task on drop arm BSC. He is able to stand to standard walker for caregiver to assist with clothing management or can complete lateral leans. Family and pt educated extensively that standard walker to be used for standing during toilteing task only and not to be used for ambulation or transfers at this time. Pt can complete LB dressing from bed level with supervision using hospital bed functions. Recommend UB bathing/dressing and grooming tasks be completed from supported chair/w/c as pt requires close supervision-min A for dynamic sitting balance.  Not recommending transfer to standard toilet or to shower/tub at this time due to safety concerns and high fall risk. Pt and wife voice understanding and agreement with all recommendations. Pt's wife and son have completed hands on family ed training session. They Voice and demonstrate ability to provide needed care at d/c.   Reasons goals not met: Pt requires at least supervision for dynamic sitting balance EOB, can require up to min-mod A to regain balance following posterior LOB episodes in unsupported sitting  Recommendation:  Patient will benefit from ongoing skilled OT services in home health setting to continue to advance functional skills in  the area of BADL and Reduce care partner burden.  Equipment: drop arm BSC, w/c, sliding board, hospital bed  Reasons for discharge: treatment goals met and discharge from hospital  Patient/family agrees with progress made and goals achieved: Yes  OT Discharge Precautions/Restrictions  Precautions Precautions: Fall Restrictions Weight Bearing Restrictions: Yes RLE Weight Bearing: Non weight bearing Vision Baseline Vision/History: Wears glasses Wears Glasses: At all times Patient Visual Report: No change from baseline Vision Assessment?: No apparent visual deficits Perception  Perception: Within Functional Limits Praxis Praxis: Impaired Praxis Impairment Details: Motor planning Cognition Overall Cognitive Status: No family/caregiver present to determine baseline cognitive functioning Arousal/Alertness: Awake/alert Orientation Level: Oriented X4 Memory: Impaired Memory Impairment: Decreased short term memory;Decreased recall of new information;Retrieval deficit Decreased Short Term Memory: Verbal basic;Functional basic Awareness: Impaired Awareness Impairment: Emergent impairment;Anticipatory impairment Problem Solving: Appears intact Safety/Judgment: Appears intact Sensation Sensation Light Touch: Appears Intact Proprioception: Impaired Detail Proprioception Impaired Details: Impaired LLE Coordination Gross Motor Movements are Fluid and Coordinated: No Fine Motor Movements are Fluid and Coordinated: Yes Coordination and Movement Description: Generalized weakness/deconditioning though much improved since admission Motor  Motor Motor: Other (comment) Motor - Discharge Observations: Impaired due to generalized weakness Trunk/Postural Assessment  Cervical Assessment Cervical Assessment: Exceptions to WFL(Forward head) Thoracic Assessment Thoracic Assessment: Exceptions to WFL(Rounded shoulders; kyphotic) Lumbar Assessment Lumbar Assessment: Exceptions to  WFL(Posterior pelvic tilt) Postural Control Postural Control: Deficits on evaluation Righting Reactions: delayed  Balance Balance Balance Assessed: Yes Static Sitting Balance Static Sitting - Balance Support: Feet supported Static Sitting - Level of Assistance: 5: Stand by assistance;6: Modified independent (Device/Increase time) Static Sitting - Comment/# of Minutes: Sitting EOB Dynamic Sitting Balance Dynamic  Sitting - Balance Support: No upper extremity supported;Feet supported;During functional activity Dynamic Sitting - Level of Assistance: 5: Stand by assistance Sitting balance - Comments: Sitting EOB, occasional posterior LOB Static Standing Balance Static Standing - Balance Support: Bilateral upper extremity supported;During functional activity Static Standing - Level of Assistance: 5: Stand by assistance;4: Min assist Static Standing - Comment/# of Minutes: Standing with standard walker Dynamic Standing Balance Dynamic Standing - Balance Support: Right upper extremity supported;Left upper extremity supported Dynamic Standing - Level of Assistance: 4: Min assist;3: Mod assist Dynamic Standing - Comments: Standing with standard walker Extremity/Trunk Assessment RUE Assessment RUE Assessment: Within Functional Limits General Strength Comments: Generalized weakness, however, WFL LUE Assessment LUE Assessment: Within Functional Limits General Strength Comments: Generalized weakness, however, WFL   Hellena Pridgen L 12/06/2018, 8:38 AM

## 2018-12-06 NOTE — Discharge Summary (Signed)
Discharge summary job [phone]

## 2018-12-06 NOTE — Progress Notes (Signed)
Physical Therapy Discharge Summary  Patient Details  Name: Jeremiah [name] MRN: [id number] Date of Birth: 1946/01/31  Today's Date: 12/06/2018 PT Individual Time: 0905-1005 PT Individual Time Calculation (min): 60 min    Patient has met 5 of 5 long term goals due to improved activity tolerance, improved balance, improved postural control, increased strength, decreased pain, ability to compensate for deficits, functional use of  right upper extremity, right lower extremity, left upper extremity and left lower extremity, improved attention and improved awareness.  Patient to discharge at a wheelchair level; Supervision/min assist for transfers.   Patient's care partner is independent to provide the necessary physical and cognitive assistance at discharge.  Reasons goals not met: na  Recommendation:  Patient will benefit from ongoing skilled PT services in home health setting to continue to advance safe functional mobility, address ongoing impairments in amputee education, w/c parts mgt and w/c mobility in community, and minimize fall risk.  Equipment: basic w/c and cushion  Reasons for discharge: treatment goals met and discharge from hospital  Patient/family agrees with progress made and goals achieved: Yes  PT Discharge Precautions/Restrictions Precautions Precautions: Fall Restrictions Weight Bearing Restrictions: Yes RLE Weight Bearing: Non weight bearing  Pain pt denies; he intermittently has phantom sensation/pain   Vision/Perception  Pt reports "my right eye is not as good as my L".  Pt has tested as having R field cut previously, but today has R peripheral vision intact.  Tracking to R in initially poor R eye, but improves with practice. Perception Perception: Within Functional Limits Praxis Praxis: Impaired Praxis Impairment Details: Motor planning  Cognition Overall Cognitive Status: No family/caregiver present to determine baseline cognitive  functioning Arousal/Alertness: Awake/alert Orientation Level: Oriented X4 Memory: Impaired Memory Impairment: Decreased short term memory;Decreased recall of new information;Retrieval deficit Decreased Short Term Memory: Verbal basic;Functional basic Awareness: Impaired Awareness Impairment: Emergent impairment;Anticipatory impairment Problem Solving: Appears intact Safety/Judgment: Appears intact Sensation Sensation Light Touch: Appears Intact Proprioception: intact L knee and ankle 5/5 movements each Coordination Gross Motor Movements are Fluid and Coordinated: No Fine Motor Movements are Fluid and Coordinated: Yes Coordination and Movement Description: Generalized weakness/deconditioning though much improved since admission Motor  Motor Motor: Other (comment) Motor - Discharge Observations: Impaired due to generalized weakness  Mobility Bed Mobility Bed Mobility: Rolling Right;Rolling Left;Right Sidelying to Sit Rolling Right: Supervision/verbal cueing Rolling Left: Supervision/Verbal cueing Right Sidelying to Sit: Supervision/Verbal cueing Supine to Sit: Supervision/Verbal cueing Sit to Supine: Supervision/Verbal cueing Transfers Transfers: Lateral/Scoot Transfers(slide board, set-up) Lateral/Scoot Transfers: Set up assist;Supervision/Verbal cueing Transfer (Assistive device): Other (Comment)(slide board) Locomotion  Gait Ambulation: No Gait Gait: No Stairs / Additional Locomotion Stairs: No Wheelchair Mobility Wheelchair Mobility: Yes Wheelchair Assistance: Chartered loss adjuster: Both upper extremities Wheelchair Parts Management: Needs assistance Distance: 150  Pt propels w/c up/down ramp with min assist. Trunk/Postural Assessment  Cervical Assessment Cervical Assessment: Exceptions to WFL(Forward head) Thoracic Assessment Thoracic Assessment: Exceptions to WFL(Rounded shoulders; kyphotic) Lumbar Assessment Lumbar Assessment:  Exceptions to WFL(Posterior pelvic tilt) Postural Control Postural Control: Deficits on evaluation Righting Reactions: delayed  Balance Balance Balance Assessed: Yes Static Sitting Balance Static Sitting - Balance Support: Feet supported Static Sitting - Level of Assistance: 5: Stand by assistance;6: Modified independent (Device/Increase time) Static Sitting - Comment/# of Minutes: Sitting EOB Dynamic Sitting Balance Dynamic Sitting - Balance Support: No upper extremity supported;Feet supported;During functional activity Dynamic Sitting - Level of Assistance: 5: Stand by assistance Sitting balance - Comments: Sitting EOB, occasional posterior LOB Static Standing Balance Static  Standing - Balance Support: Bilateral upper extremity supported;During functional activity Static Standing - Level of Assistance: 5: Stand by assistance;4: Min assist Static Standing - Comment/# of Minutes: Standing with standard walker Dynamic Standing Balance Dynamic Standing - Balance Support: Right upper extremity supported;Left upper extremity supported Dynamic Standing - Level of Assistance: 4: Min assist;3: Mod assist Dynamic Standing - Comments: Standing with standard walker Extremity Assessment  RLE Assessment RLE Assessment: Exceptions to [facility] Passive Range of Motion (PROM) Comments: R AKA General Strength Comments: 4/5 hip RLE Strength RLE Overall Strength: Within Functional Limits for tasks assessed LLE Assessment LLE Assessment: Exceptions to [facility] Passive Range of Motion (PROM) Comments: tight hamstrings and heel cords General Strength Comments: impaired LLE Strength Left Hip Flexion: 4-/5 Left Knee Flexion: 4/5 Left Knee Extension: 4/5 Left Ankle Dorsiflexion: 4/5    COOK,CAROLINE 12/06/2018, 10:28 AM

## 2018-12-07 LAB — GLUCOSE, CAPILLARY: Glucose-Capillary: 111 mg/dL — ABNORMAL HIGH (ref 70–99)

## 2018-12-07 NOTE — Progress Notes (Signed)
Social Work  Discharge Note  The overall goal for the admission was met for:   Discharge location: Yes-HOME WITH WIFE AND SON AND BROTHER TO ASSIST-24 HR CARE  Length of Stay: Yes-22 DAYS  Discharge activity level: Yes-SUPERVISION-MIN ASSIST WHEELCHAIR LEVEL  Home/community participation: Yes  Services provided included: MD, RD, PT, OT, RN, CM, TR, Pharmacy, Neuropsych and [place]: Private Insurance: [employer]  Follow-up services arranged: Home Health: [facility]-PT, OT RN, AIDE, DME: [facility] [place] BED, WIDE DROP-ARM BEDSIDE COMMODE [place] and Patient/Family request agency HH: [facility] PT, DME: HAS DME FROM [facility] WANTED TO USE AGAIN  Comments (or additional information):WIFE, SON AND BROTHER WERE HERE FOR [place]  Patient/Family verbalized understanding of follow-up arrangements: Yes  Individual responsible for coordination of the follow-up plan: PHYLLIS-WIFE AND BRIAN-SON  Confirmed correct DME delivered: Jeremiah [name] 12/07/2018    Jeremiah [name]

## 2018-12-08 DIAGNOSIS — I5032 Chronic diastolic (congestive) heart failure: Secondary | ICD-10-CM | POA: Diagnosis not present

## 2018-12-08 DIAGNOSIS — L89312 Pressure ulcer of right buttock, stage 2: Secondary | ICD-10-CM | POA: Diagnosis not present

## 2018-12-08 DIAGNOSIS — E1122 Type 2 diabetes mellitus with diabetic chronic kidney disease: Secondary | ICD-10-CM | POA: Diagnosis not present

## 2018-12-08 DIAGNOSIS — E1151 Type 2 diabetes mellitus with diabetic peripheral angiopathy without gangrene: Secondary | ICD-10-CM | POA: Diagnosis not present

## 2018-12-08 DIAGNOSIS — N183 Chronic kidney disease, stage 3 (moderate): Secondary | ICD-10-CM | POA: Diagnosis not present

## 2018-12-08 DIAGNOSIS — D631 Anemia in chronic kidney disease: Secondary | ICD-10-CM | POA: Diagnosis not present

## 2018-12-08 DIAGNOSIS — I130 Hypertensive heart and chronic kidney disease with heart failure and stage 1 through stage 4 chronic kidney disease, or unspecified chronic kidney disease: Secondary | ICD-10-CM | POA: Diagnosis not present

## 2018-12-08 DIAGNOSIS — E43 Unspecified severe protein-calorie malnutrition: Secondary | ICD-10-CM | POA: Diagnosis not present

## 2018-12-08 DIAGNOSIS — Z4781 Encounter for orthopedic aftercare following surgical amputation: Secondary | ICD-10-CM | POA: Diagnosis not present

## 2018-12-09 ENCOUNTER — Telehealth: Payer: Self-pay

## 2018-12-09 ENCOUNTER — Telehealth: Payer: Self-pay | Admitting: Registered Nurse

## 2018-12-09 DIAGNOSIS — N183 Chronic kidney disease, stage 3 (moderate): Secondary | ICD-10-CM | POA: Diagnosis not present

## 2018-12-09 DIAGNOSIS — L89312 Pressure ulcer of right buttock, stage 2: Secondary | ICD-10-CM | POA: Diagnosis not present

## 2018-12-09 DIAGNOSIS — E1151 Type 2 diabetes mellitus with diabetic peripheral angiopathy without gangrene: Secondary | ICD-10-CM | POA: Diagnosis not present

## 2018-12-09 DIAGNOSIS — E1122 Type 2 diabetes mellitus with diabetic chronic kidney disease: Secondary | ICD-10-CM | POA: Diagnosis not present

## 2018-12-09 DIAGNOSIS — Z4781 Encounter for orthopedic aftercare following surgical amputation: Secondary | ICD-10-CM | POA: Diagnosis not present

## 2018-12-09 DIAGNOSIS — D631 Anemia in chronic kidney disease: Secondary | ICD-10-CM | POA: Diagnosis not present

## 2018-12-09 DIAGNOSIS — E43 Unspecified severe protein-calorie malnutrition: Secondary | ICD-10-CM | POA: Diagnosis not present

## 2018-12-09 DIAGNOSIS — I5032 Chronic diastolic (congestive) heart failure: Secondary | ICD-10-CM | POA: Diagnosis not present

## 2018-12-09 DIAGNOSIS — I130 Hypertensive heart and chronic kidney disease with heart failure and stage 1 through stage 4 chronic kidney disease, or unspecified chronic kidney disease: Secondary | ICD-10-CM | POA: Diagnosis not present

## 2018-12-09 NOTE — Telephone Encounter (Signed)
Tomma Lightning, PT from [facility] called requesting verbal orders for HHPT 2wk3, 1wk1. Orders approved and given per discharge summary

## 2018-12-09 NOTE — Telephone Encounter (Signed)
Transitional Care call Transitional Care Call Completed [facility] answered Transitional Care Call Questions  Patient name: Jeremiah [name] DOB: [birth year] 1. Are you/is patient experiencing any problems since coming home? No a. Are there any questions regarding any aspect of care? No 2. Are there any questions regarding medications administration/dosing? No a. Are meds being taken as prescribed? Yes b. "Patient should review meds with caller to confirm"  Medication List Reviewed 3. Have there been any falls? No 4. Has Home Health been to the house and/or have they contacted you? Yes, [facility] a. If not, have you tried to contact them? NA b. Can we help you contact them? NA 5. Are bowels and bladder emptying properly? Yes a. Are there any unexpected incontinence issues? No b. If applicable, is patient following bowel/bladder programs? NA 6. Any fevers, problems with breathing, unexpected pain? No 7. Are there any skin problems or new areas of breakdown? No 8.  Has the patient/family member arranged specialty MD follow up (ie cardiology/neurology/renal/surgical/etc.)?  Yes a. Can we help arrange? NA 9. Does the patient need any other services or support that we can help arrange? NA 10. Are caregivers following through as expected in assisting the patient? Yes 11. Has the patient quit smoking, drinking alcohol, or using drugs as recommended? [name] states her husband doesn't smoke, drink alcohol or use illicit drugs.   Appointment date/time 12/17/2018  arrival time 1:20 for 1:40 appointment with Dr. Allena Katz. At [address] [facility] [address]

## 2018-12-10 ENCOUNTER — Other Ambulatory Visit: Payer: Self-pay

## 2018-12-10 ENCOUNTER — Encounter: Payer: Self-pay | Admitting: Vascular Surgery

## 2018-12-10 ENCOUNTER — Ambulatory Visit (INDEPENDENT_AMBULATORY_CARE_PROVIDER_SITE_OTHER): Payer: Self-pay | Admitting: Vascular Surgery

## 2018-12-10 VITALS — BP 145/59 | HR 60 | Temp 96.9°F | Resp 16 | Ht 69.0 in | Wt 164.0 lb

## 2018-12-10 DIAGNOSIS — I739 Peripheral vascular disease, unspecified: Secondary | ICD-10-CM

## 2018-12-10 NOTE — Progress Notes (Signed)
    Subjective:     Patient ID: Jeremiah [name], male   DOB: [birth year], 73 y.o.   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  Healing well.  Now at home.  Doing well.  Has not been fitted for prosthetic.   Review of Systems No complaints    Objective:   Physical Exam Awake alert oriented Nonlabored respirations Right AKA healing well staples in place    Assessment:     72-year male status post right above-knee amputation doing well.    Plan:     Staples removed today  refer to [employer] for prosthetic evaluation Follow-up in 3 months with ABI to evaluate left lower extremity given previous decreased ABI there.      Shaunn Tackitt C. [name], MD [facility] Office: [phone] Pager: [phone]

## 2018-12-13 DIAGNOSIS — I5032 Chronic diastolic (congestive) heart failure: Secondary | ICD-10-CM | POA: Diagnosis not present

## 2018-12-13 DIAGNOSIS — I130 Hypertensive heart and chronic kidney disease with heart failure and stage 1 through stage 4 chronic kidney disease, or unspecified chronic kidney disease: Secondary | ICD-10-CM | POA: Diagnosis not present

## 2018-12-13 DIAGNOSIS — E1122 Type 2 diabetes mellitus with diabetic chronic kidney disease: Secondary | ICD-10-CM | POA: Diagnosis not present

## 2018-12-13 DIAGNOSIS — Z4781 Encounter for orthopedic aftercare following surgical amputation: Secondary | ICD-10-CM | POA: Diagnosis not present

## 2018-12-13 DIAGNOSIS — N183 Chronic kidney disease, stage 3 (moderate): Secondary | ICD-10-CM | POA: Diagnosis not present

## 2018-12-13 DIAGNOSIS — D631 Anemia in chronic kidney disease: Secondary | ICD-10-CM | POA: Diagnosis not present

## 2018-12-13 DIAGNOSIS — L89312 Pressure ulcer of right buttock, stage 2: Secondary | ICD-10-CM | POA: Diagnosis not present

## 2018-12-13 DIAGNOSIS — E1151 Type 2 diabetes mellitus with diabetic peripheral angiopathy without gangrene: Secondary | ICD-10-CM | POA: Diagnosis not present

## 2018-12-13 DIAGNOSIS — E43 Unspecified severe protein-calorie malnutrition: Secondary | ICD-10-CM | POA: Diagnosis not present

## 2018-12-14 DIAGNOSIS — D631 Anemia in chronic kidney disease: Secondary | ICD-10-CM | POA: Diagnosis not present

## 2018-12-14 DIAGNOSIS — L89312 Pressure ulcer of right buttock, stage 2: Secondary | ICD-10-CM | POA: Diagnosis not present

## 2018-12-14 DIAGNOSIS — I5032 Chronic diastolic (congestive) heart failure: Secondary | ICD-10-CM | POA: Diagnosis not present

## 2018-12-14 DIAGNOSIS — E43 Unspecified severe protein-calorie malnutrition: Secondary | ICD-10-CM | POA: Diagnosis not present

## 2018-12-14 DIAGNOSIS — I130 Hypertensive heart and chronic kidney disease with heart failure and stage 1 through stage 4 chronic kidney disease, or unspecified chronic kidney disease: Secondary | ICD-10-CM | POA: Diagnosis not present

## 2018-12-14 DIAGNOSIS — E1122 Type 2 diabetes mellitus with diabetic chronic kidney disease: Secondary | ICD-10-CM | POA: Diagnosis not present

## 2018-12-14 DIAGNOSIS — N183 Chronic kidney disease, stage 3 (moderate): Secondary | ICD-10-CM | POA: Diagnosis not present

## 2018-12-14 DIAGNOSIS — Z4781 Encounter for orthopedic aftercare following surgical amputation: Secondary | ICD-10-CM | POA: Diagnosis not present

## 2018-12-14 DIAGNOSIS — E1151 Type 2 diabetes mellitus with diabetic peripheral angiopathy without gangrene: Secondary | ICD-10-CM | POA: Diagnosis not present

## 2018-12-16 DIAGNOSIS — I130 Hypertensive heart and chronic kidney disease with heart failure and stage 1 through stage 4 chronic kidney disease, or unspecified chronic kidney disease: Secondary | ICD-10-CM | POA: Diagnosis not present

## 2018-12-16 DIAGNOSIS — L89312 Pressure ulcer of right buttock, stage 2: Secondary | ICD-10-CM | POA: Diagnosis not present

## 2018-12-16 DIAGNOSIS — E1151 Type 2 diabetes mellitus with diabetic peripheral angiopathy without gangrene: Secondary | ICD-10-CM | POA: Diagnosis not present

## 2018-12-16 DIAGNOSIS — D631 Anemia in chronic kidney disease: Secondary | ICD-10-CM | POA: Diagnosis not present

## 2018-12-16 DIAGNOSIS — E1122 Type 2 diabetes mellitus with diabetic chronic kidney disease: Secondary | ICD-10-CM | POA: Diagnosis not present

## 2018-12-16 DIAGNOSIS — Z4781 Encounter for orthopedic aftercare following surgical amputation: Secondary | ICD-10-CM | POA: Diagnosis not present

## 2018-12-16 DIAGNOSIS — N183 Chronic kidney disease, stage 3 (moderate): Secondary | ICD-10-CM | POA: Diagnosis not present

## 2018-12-16 DIAGNOSIS — I5032 Chronic diastolic (congestive) heart failure: Secondary | ICD-10-CM | POA: Diagnosis not present

## 2018-12-16 DIAGNOSIS — E43 Unspecified severe protein-calorie malnutrition: Secondary | ICD-10-CM | POA: Diagnosis not present

## 2018-12-17 ENCOUNTER — Encounter: Payer: Medicare HMO | Attending: Physical Medicine & Rehabilitation | Admitting: Physical Medicine & Rehabilitation

## 2018-12-17 ENCOUNTER — Encounter: Payer: Self-pay | Admitting: Physical Medicine & Rehabilitation

## 2018-12-17 VITALS — BP 141/95 | HR 58 | Ht 69.0 in | Wt 164.0 lb

## 2018-12-17 DIAGNOSIS — N183 Chronic kidney disease, stage 3 (moderate): Secondary | ICD-10-CM | POA: Diagnosis not present

## 2018-12-17 DIAGNOSIS — Z4781 Encounter for orthopedic aftercare following surgical amputation: Secondary | ICD-10-CM | POA: Diagnosis not present

## 2018-12-17 DIAGNOSIS — I10 Essential (primary) hypertension: Secondary | ICD-10-CM | POA: Diagnosis not present

## 2018-12-17 DIAGNOSIS — E43 Unspecified severe protein-calorie malnutrition: Secondary | ICD-10-CM | POA: Diagnosis not present

## 2018-12-17 DIAGNOSIS — G8918 Other acute postprocedural pain: Secondary | ICD-10-CM

## 2018-12-17 DIAGNOSIS — R269 Unspecified abnormalities of gait and mobility: Secondary | ICD-10-CM

## 2018-12-17 DIAGNOSIS — E1151 Type 2 diabetes mellitus with diabetic peripheral angiopathy without gangrene: Secondary | ICD-10-CM | POA: Diagnosis not present

## 2018-12-17 DIAGNOSIS — I5032 Chronic diastolic (congestive) heart failure: Secondary | ICD-10-CM | POA: Diagnosis not present

## 2018-12-17 DIAGNOSIS — I130 Hypertensive heart and chronic kidney disease with heart failure and stage 1 through stage 4 chronic kidney disease, or unspecified chronic kidney disease: Secondary | ICD-10-CM | POA: Diagnosis not present

## 2018-12-17 DIAGNOSIS — E1142 Type 2 diabetes mellitus with diabetic polyneuropathy: Secondary | ICD-10-CM | POA: Diagnosis not present

## 2018-12-17 DIAGNOSIS — Z89611 Acquired absence of right leg above knee: Secondary | ICD-10-CM | POA: Diagnosis not present

## 2018-12-17 DIAGNOSIS — E1122 Type 2 diabetes mellitus with diabetic chronic kidney disease: Secondary | ICD-10-CM | POA: Diagnosis not present

## 2018-12-17 DIAGNOSIS — D631 Anemia in chronic kidney disease: Secondary | ICD-10-CM | POA: Diagnosis not present

## 2018-12-17 DIAGNOSIS — L89312 Pressure ulcer of right buttock, stage 2: Secondary | ICD-10-CM | POA: Diagnosis not present

## 2018-12-17 NOTE — Progress Notes (Signed)
Subjective:    Patient ID: Jeremiah [name], male    DOB: 05-24-1946, 73 y.o.   MRN: [id number]  HPI 73 year old right-handed male with history of diastolic congestive heart failure, diabetes mellitus, recurrent Clostridium difficile presents for transitional care management after receiving CIR for right AKA.   DATE OF ADMISSION: 11/16/2019 DATE OF DISCHARGE: 12/07/2018   Wife provides history. At discharge he was instructed to follow up with Ortho.  He has not seen PCP yet. Pain is controlled. BP is relatively controlled. CBGs have been controlled. Diarrhea resolved.  Therapies: 2/week DME: Toilet seat, hospital bed Mobility: Wheelchair at all times  Pain Inventory Average Pain 0 Pain Right Now 0 My pain is no pain  In the last 24 hours, has pain interfered with the following? General activity 0 Relation with others 0 Enjoyment of life 0 What TIME of day is your pain at its worst? night Sleep (in general) Fair  Pain is worse with: no pain Pain improves with: no pain Relief from Meds: no pain  Mobility ability to climb steps?  no do you drive?  no use a wheelchair  Function not employed: date last employed .  Neuro/Psych trouble walking  Prior Studies transitional care  Physicians involved in your care transitional care   Family History  Problem Relation Age of Onset  . Diabetes Mother   . Hypertension Mother   . Heart disease Father   . Heart attack Father   . Diabetes Sister   . Hypertension Sister   . Diabetes Brother   . Heart disease Brother   . Hypertension Brother   . Heart attack Brother    Social History   Socioeconomic History  . Marital status: Married    Spouse name: Not on file  . Number of children: Not on file  . Years of education: Not on file  . Highest education level: Not on file  Occupational History  . Not on file  Social Needs  . Financial resource strain: Not on file  . Food insecurity:    Worry: Not on file   Inability: Not on file  . Transportation needs:    Medical: Not on file    Non-medical: Not on file  Tobacco Use  . Smoking status: Former Smoker    Years: 24.00    Types: Cigarettes    Last attempt to quit: 11/25/1983    Years since quitting: 35.0  . Smokeless tobacco: Never Used  Substance and Sexual Activity  . Alcohol use: Not Currently  . Drug use: No  . Sexual activity: Not on file  Lifestyle  . Physical activity:    Days per week: Not on file    Minutes per session: Not on file  . Stress: Not on file  Relationships  . Social connections:    Talks on phone: Not on file    Gets together: Not on file    Attends religious service: Not on file    Active member of club or organization: Not on file    Attends meetings of clubs or organizations: Not on file    Relationship status: Not on file  Other Topics Concern  . Not on file  Social History Narrative  . Not on file   Past Surgical History:  Procedure Laterality Date  . ABDOMINAL AORTOGRAM W/LOWER EXTREMITY N/A 11/01/2018   Procedure: ABDOMINAL AORTOGRAM W/LOWER EXTREMITY;  Surgeon: Runell GessBerry, Jonathan J, MD;  Location: [facility]AS[facility];  Service: Cardiovascular;  Laterality: N/A;  .  AMPUTATION Right 11/09/2018   Procedure: RIGHT - AMPUTATION ABOVE KNEE;  Surgeon: Maeola Harmanain, Brandon Christopher, MD;  [facility];  Service: Vascular;  Laterality: Right;  . CATARACT EXTRACTION W/ INTRAOCULAR LENS  IMPLANT, BILATERAL Bilateral   . CHOLECYSTECTOMY  05/21/2018   ATTEMPTED LAPAROSCOPIC CHOLECYSTECTOMY, OPEN DRAINAGE OF GALLBLADDER WITH BIOPSY  . CHOLECYSTECTOMY N/A 05/21/2018   Procedure: ATTEMPTED LAPAROSCOPIC CHOLECYSTECTOMY, OPEN DRAINAGE OF GALLBLADDER WITH BIOPSY;  Surgeon: Griselda Mineroth, Paul III, MD;  Location: [facility] OR;  Service: General;  Laterality: N/A;  . COLONOSCOPY W/ POLYPECTOMY    . COLONOSCOPY WITH PROPOFOL N/A 09/16/2018   Procedure: COLONOSCOPY WITH PROPOFOL;  Surgeon: Charlott RakesSchooler, Vincent, MD;  Location: [facility] ENDOSCOPY;   Service: Endoscopy;  Laterality: N/A;  . 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 IR CATHETER TUBE CHANGE  04/14/2018  . IR CHOLANGIOGRAM EXISTING TUBE  03/17/2018  . IR PERC CHOLECYSTOSTOMY  01/31/2018  . IR RADIOLOGIST EVAL & MGMT  03/02/2018   Past Medical History:  Diagnosis Date  . Arthritis    "joints; shoulders, knees, hands, back" (05/21/2018)  . C. difficile diarrhea 04/2018  . Diastolic CHF (HCC)   . High cholesterol   . History of gout   . Hypertension   . Peripheral vascular disease (HCC)   . Pneumonia    "couple times" (05/21/2018)  . Sleep apnea    "has mask; won't use" (05/21/2018)  . Type II diabetes mellitus (HCC)    BP (!) 141/95   Pulse (!) 58   Ht 5\' 9"  (1.753 m)   Wt 164 lb (74.4 kg)   SpO2 95%   BMI 24.22 kg/m   Opioid Risk Score:   Fall Risk Score:  `1  Depression screen PHQ 2/9  [facility] 09/07/2018 07/29/2018  Decreased Interest 0 0  Down, Depressed, Hopeless 0 0  PHQ - 2 Score 0 0    Review of Systems  Constitutional: Negative.   HENT: Negative.   Eyes: Negative.   Respiratory: Negative.   Cardiovascular: Negative.   Gastrointestinal: Negative.   Endocrine: Negative.   Genitourinary: Negative.   Musculoskeletal: Positive for gait problem.  Skin: Negative.   Allergic/Immunologic: Negative.   Neurological: Positive for weakness. Negative for numbness.  Hematological: Negative.   Psychiatric/Behavioral: Negative.   All other systems reviewed and are negative.     Objective:   Physical Exam Constitutional: No distress . Vital signs reviewed. HENT: Normocephalic.  Atraumatic. Eyes: EOMI. No discharge. Cardiovascular: RRR. No JVD. Respiratory: CTA bilaterally. Normal effort. GI: BS +. Non-distended. Musc: Right AKA with edema Neurological: He is alert.  HOH Some delay in processing, unchanged.    Follows commands. Motor: Right lower extremity: Hip flexion 4+/5, unchanged Left lower extremity: Hip flexion 4/5, knee extension 4+/5, ankle dorsiflexion 4+/5 Skin: Right AKA healing Psychiatric: His affect is blunt. He is slowed.     Assessment & Plan:  73 year old right-handed male with history of diastolic congestive heart failure, diabetes mellitus, recurrent Clostridium difficile presents for transitional care management after receiving CIR for right AKA.   1. Decreased functional mobility secondary to right AKA 11/09/2018             Continue therapies  Cont stump shrinker   2. Pain Management:    Controlled at present  3. Hypertension.  Cont meds  Relatively controlled at present  4. Diabetes mellitus with peripheral neuropathy.  Cont meds  Controlled at present  5. Gait abnormality  Cont walker for safety  Cont therapies  Meds reviewed Referrals reviewed All questions answered

## 2018-12-20 DIAGNOSIS — I130 Hypertensive heart and chronic kidney disease with heart failure and stage 1 through stage 4 chronic kidney disease, or unspecified chronic kidney disease: Secondary | ICD-10-CM | POA: Diagnosis not present

## 2018-12-20 DIAGNOSIS — D631 Anemia in chronic kidney disease: Secondary | ICD-10-CM | POA: Diagnosis not present

## 2018-12-20 DIAGNOSIS — E43 Unspecified severe protein-calorie malnutrition: Secondary | ICD-10-CM | POA: Diagnosis not present

## 2018-12-20 DIAGNOSIS — N183 Chronic kidney disease, stage 3 (moderate): Secondary | ICD-10-CM | POA: Diagnosis not present

## 2018-12-20 DIAGNOSIS — L89312 Pressure ulcer of right buttock, stage 2: Secondary | ICD-10-CM | POA: Diagnosis not present

## 2018-12-20 DIAGNOSIS — E1122 Type 2 diabetes mellitus with diabetic chronic kidney disease: Secondary | ICD-10-CM | POA: Diagnosis not present

## 2018-12-20 DIAGNOSIS — I5032 Chronic diastolic (congestive) heart failure: Secondary | ICD-10-CM | POA: Diagnosis not present

## 2018-12-20 DIAGNOSIS — Z4781 Encounter for orthopedic aftercare following surgical amputation: Secondary | ICD-10-CM | POA: Diagnosis not present

## 2018-12-20 DIAGNOSIS — E1151 Type 2 diabetes mellitus with diabetic peripheral angiopathy without gangrene: Secondary | ICD-10-CM | POA: Diagnosis not present

## 2018-12-22 DIAGNOSIS — N183 Chronic kidney disease, stage 3 (moderate): Secondary | ICD-10-CM | POA: Diagnosis not present

## 2018-12-22 DIAGNOSIS — I5032 Chronic diastolic (congestive) heart failure: Secondary | ICD-10-CM | POA: Diagnosis not present

## 2018-12-22 DIAGNOSIS — D631 Anemia in chronic kidney disease: Secondary | ICD-10-CM | POA: Diagnosis not present

## 2018-12-22 DIAGNOSIS — I130 Hypertensive heart and chronic kidney disease with heart failure and stage 1 through stage 4 chronic kidney disease, or unspecified chronic kidney disease: Secondary | ICD-10-CM | POA: Diagnosis not present

## 2018-12-22 DIAGNOSIS — E43 Unspecified severe protein-calorie malnutrition: Secondary | ICD-10-CM | POA: Diagnosis not present

## 2018-12-22 DIAGNOSIS — L89312 Pressure ulcer of right buttock, stage 2: Secondary | ICD-10-CM | POA: Diagnosis not present

## 2018-12-22 DIAGNOSIS — Z4781 Encounter for orthopedic aftercare following surgical amputation: Secondary | ICD-10-CM | POA: Diagnosis not present

## 2018-12-22 DIAGNOSIS — E1151 Type 2 diabetes mellitus with diabetic peripheral angiopathy without gangrene: Secondary | ICD-10-CM | POA: Diagnosis not present

## 2018-12-22 DIAGNOSIS — E1122 Type 2 diabetes mellitus with diabetic chronic kidney disease: Secondary | ICD-10-CM | POA: Diagnosis not present

## 2018-12-24 DIAGNOSIS — E1122 Type 2 diabetes mellitus with diabetic chronic kidney disease: Secondary | ICD-10-CM | POA: Diagnosis not present

## 2018-12-24 DIAGNOSIS — E1151 Type 2 diabetes mellitus with diabetic peripheral angiopathy without gangrene: Secondary | ICD-10-CM | POA: Diagnosis not present

## 2018-12-24 DIAGNOSIS — E43 Unspecified severe protein-calorie malnutrition: Secondary | ICD-10-CM | POA: Diagnosis not present

## 2018-12-24 DIAGNOSIS — D631 Anemia in chronic kidney disease: Secondary | ICD-10-CM | POA: Diagnosis not present

## 2018-12-24 DIAGNOSIS — I5032 Chronic diastolic (congestive) heart failure: Secondary | ICD-10-CM | POA: Diagnosis not present

## 2018-12-24 DIAGNOSIS — N183 Chronic kidney disease, stage 3 (moderate): Secondary | ICD-10-CM | POA: Diagnosis not present

## 2018-12-24 DIAGNOSIS — I130 Hypertensive heart and chronic kidney disease with heart failure and stage 1 through stage 4 chronic kidney disease, or unspecified chronic kidney disease: Secondary | ICD-10-CM | POA: Diagnosis not present

## 2018-12-24 DIAGNOSIS — Z4781 Encounter for orthopedic aftercare following surgical amputation: Secondary | ICD-10-CM | POA: Diagnosis not present

## 2018-12-24 DIAGNOSIS — L89312 Pressure ulcer of right buttock, stage 2: Secondary | ICD-10-CM | POA: Diagnosis not present

## 2018-12-27 ENCOUNTER — Ambulatory Visit: Payer: Medicare Other | Admitting: Internal Medicine

## 2018-12-30 IMAGING — CT CT HEAD W/O CM
3 series · 15 of 47 positions shown, 18 images · non-contrast
Comparison: 07/17/2016

CLINICAL DATA: Altered level of consciousness

EXAM:
CT HEAD WITHOUT CONTRAST
TECHNIQUE: Contiguous axial images were obtained from the base of the skull
through the vertex without intravenous contrast.

[Series 2: head wo · axial · 0.47mm/px · z∈[-97,+33]mm · 9 of 32 slices shown, 12 images]
[im 3/32  brain]
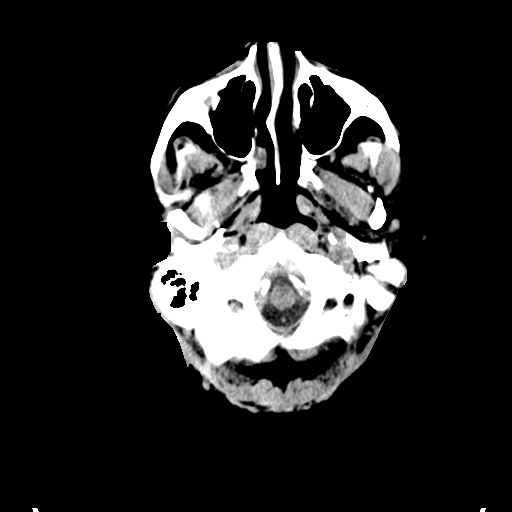
[im 3/32  bone]
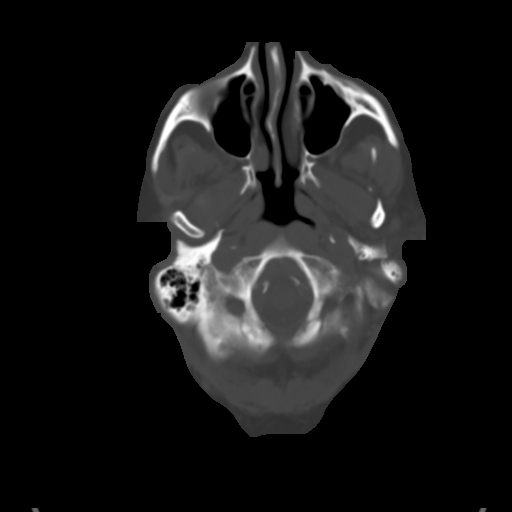
[im 6/32  brain]
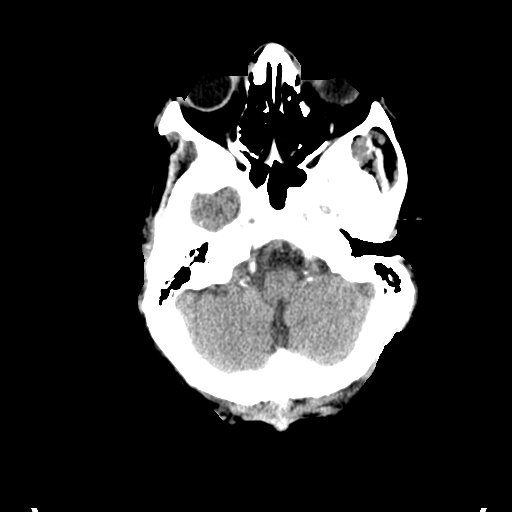
[im 9/32  brain]
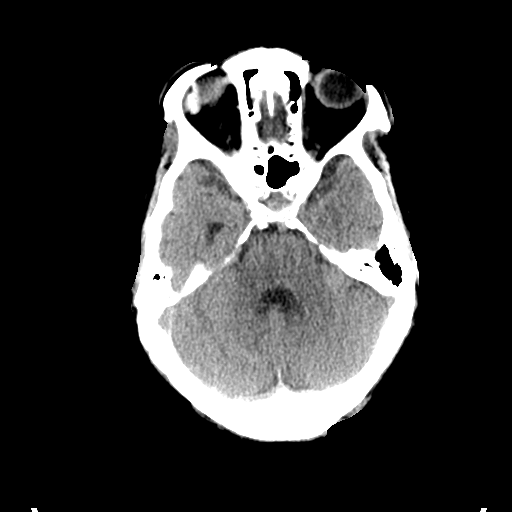
[im 12/32  brain]
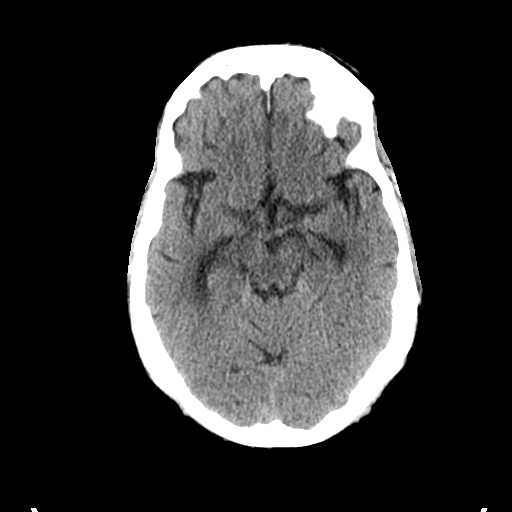
[im 17/32  brain]
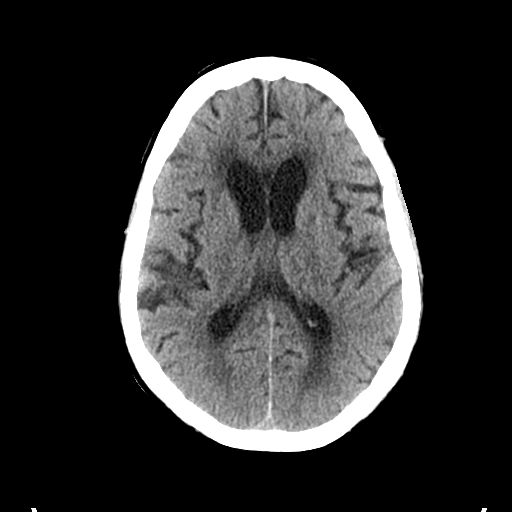
[im 17/32  bone]
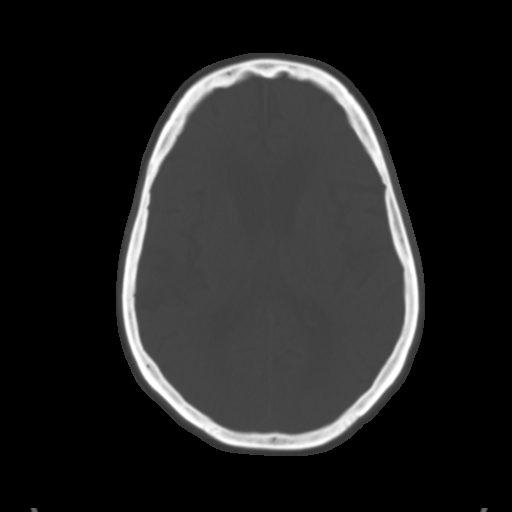
[im 20/32  brain]
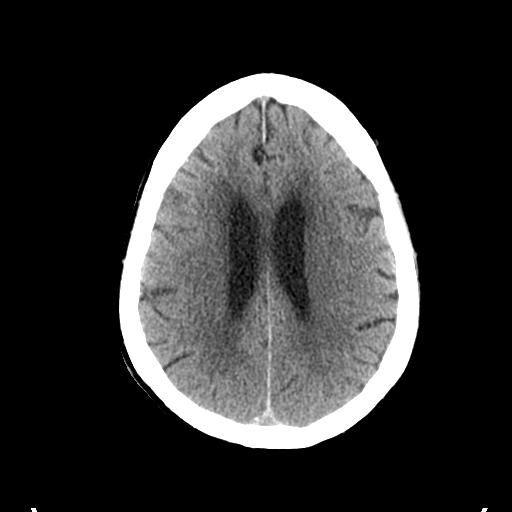
[im 23/32  brain]
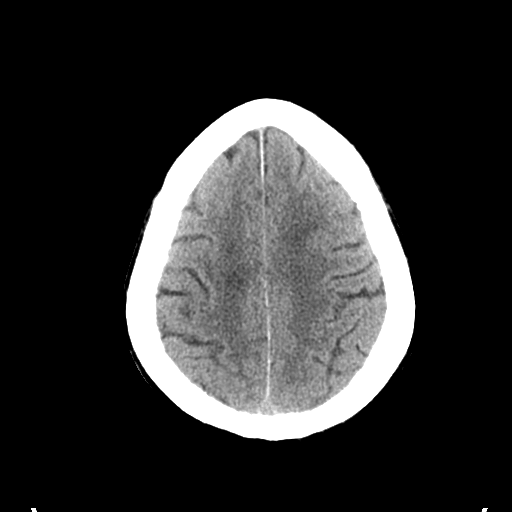
[im 26/32  brain]
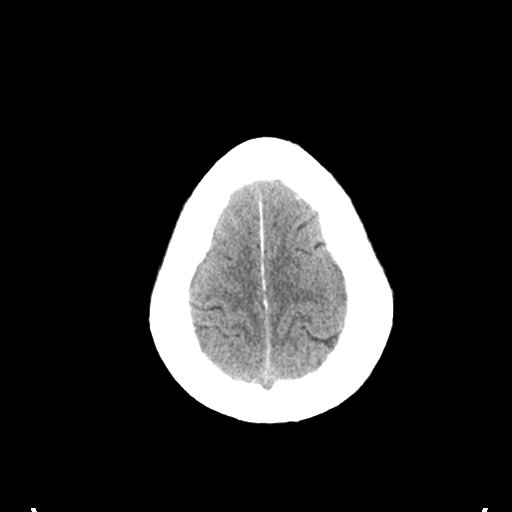
[im 29/32  brain]
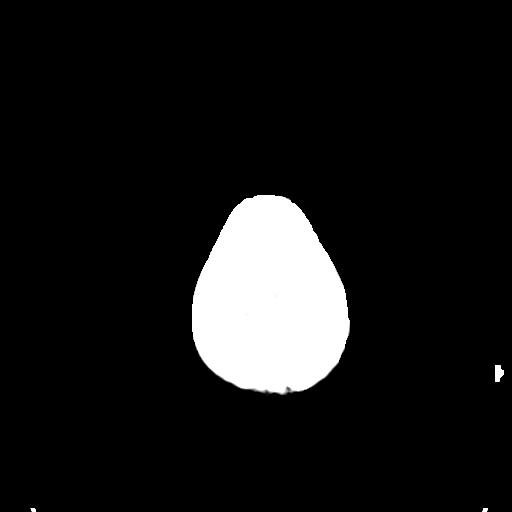
[im 29/32  bone]
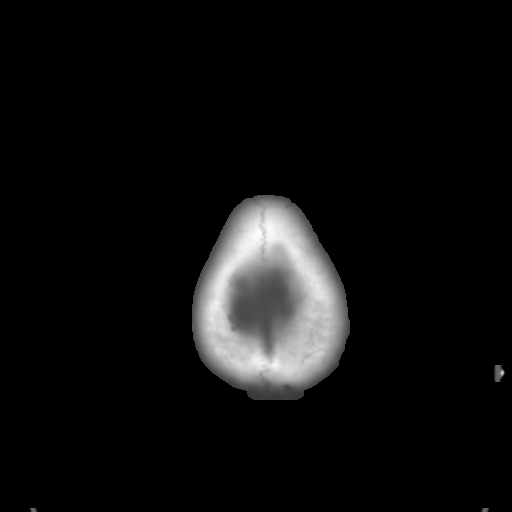

[Series 5: coronal soft tissue · coronal · 0.31mm/px · 3 of 69 slices shown]
[im 23/69  brain]
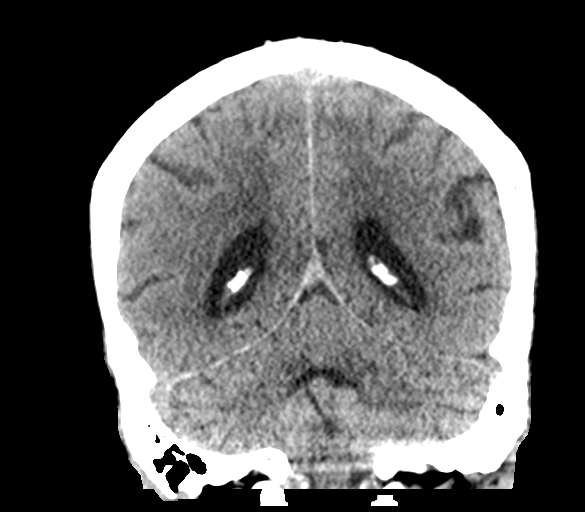
[im 31/69  brain]
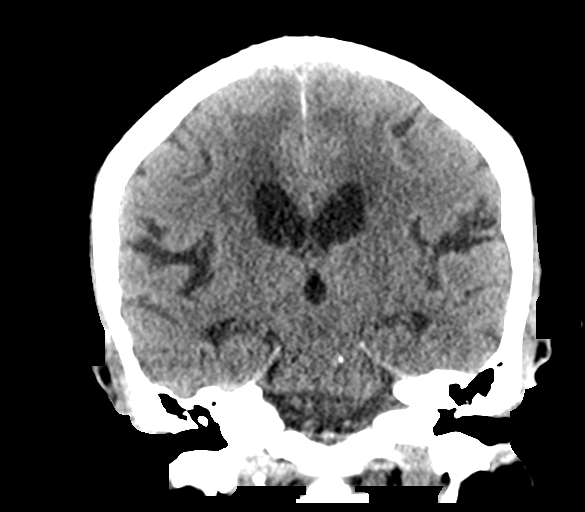
[im 38/69  brain]
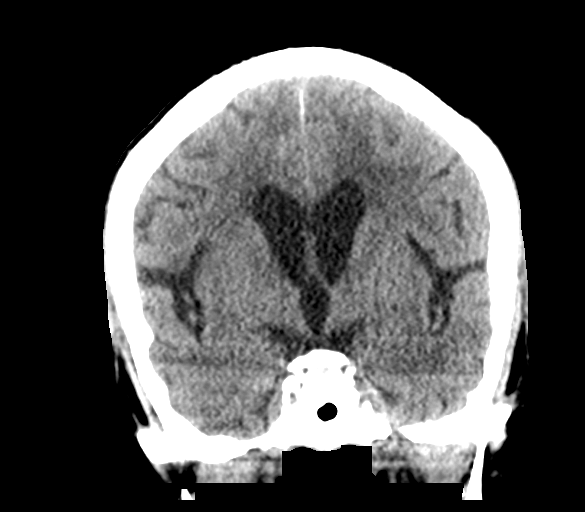

[Series 6: sagittal soft tissue · sagittal · 0.31mm/px · 3 of 62 slices shown]
[im 21/62  brain]
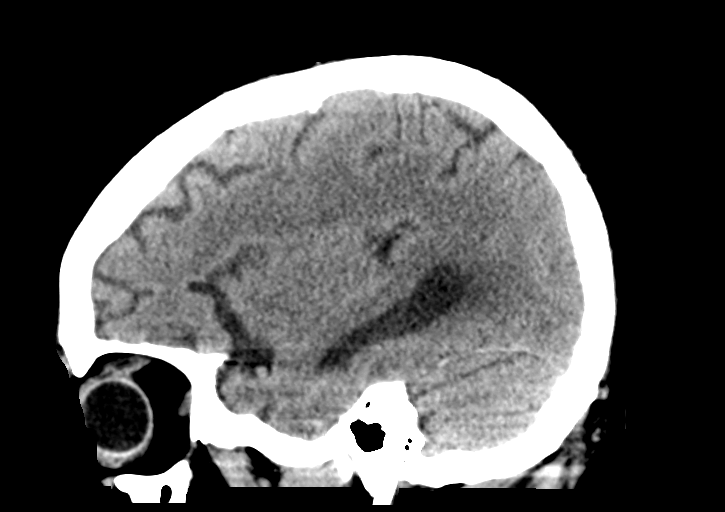
[im 31/62  brain]
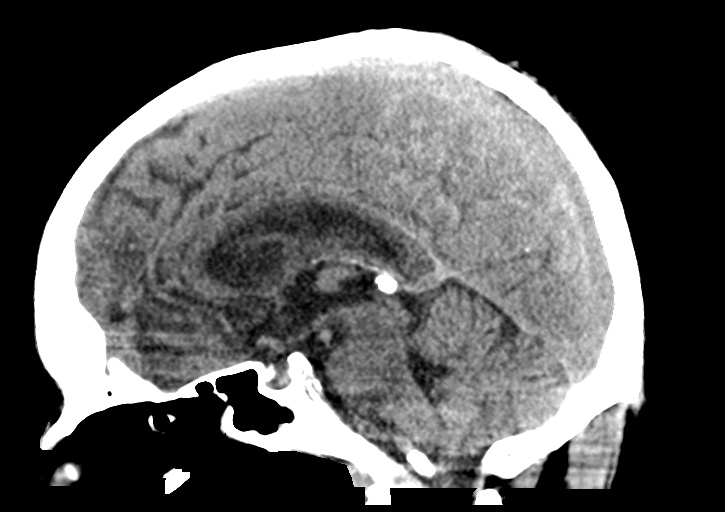
[im 41/62  brain]
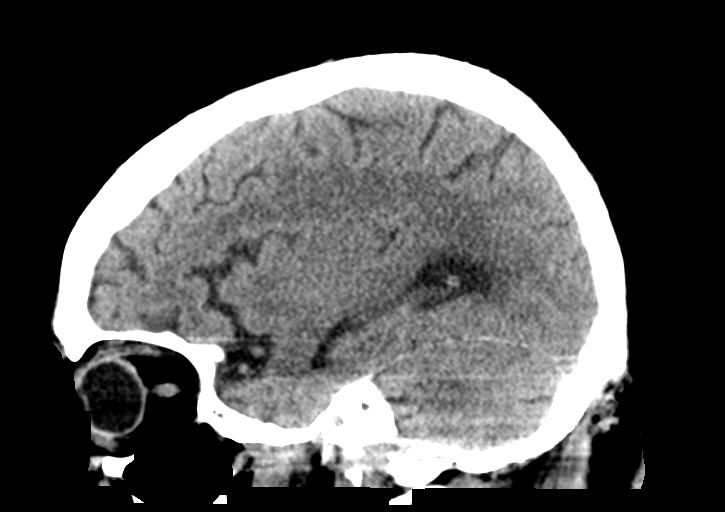

[15 of 47 positions shown; findings below may reference images not displayed]

FINDINGS: Brain: There is atrophy and chronic small vessel disease changes. No
acute intracranial abnormality. Specifically, no hemorrhage,
hydrocephalus, mass lesion, acute infarction, or significant
intracranial injury.

Vascular: No hyperdense vessel or unexpected calcification.

Skull: No acute calvarial abnormality.

Sinuses/Orbits: Visualized paranasal sinuses and mastoids clear.
Orbital soft tissues unremarkable.

Other: None
IMPRESSION: No acute intracranial abnormality.

Atrophy, chronic microvascular disease.

## 2018-12-30 IMAGING — DX DG CHEST 1V PORT
1 series · 1 of 1 positions shown · non-contrast
Comparison: May 18, 2018

CLINICAL DATA: Altered mental status

EXAM:
PORTABLE CHEST 1 VIEW

[chest ap]
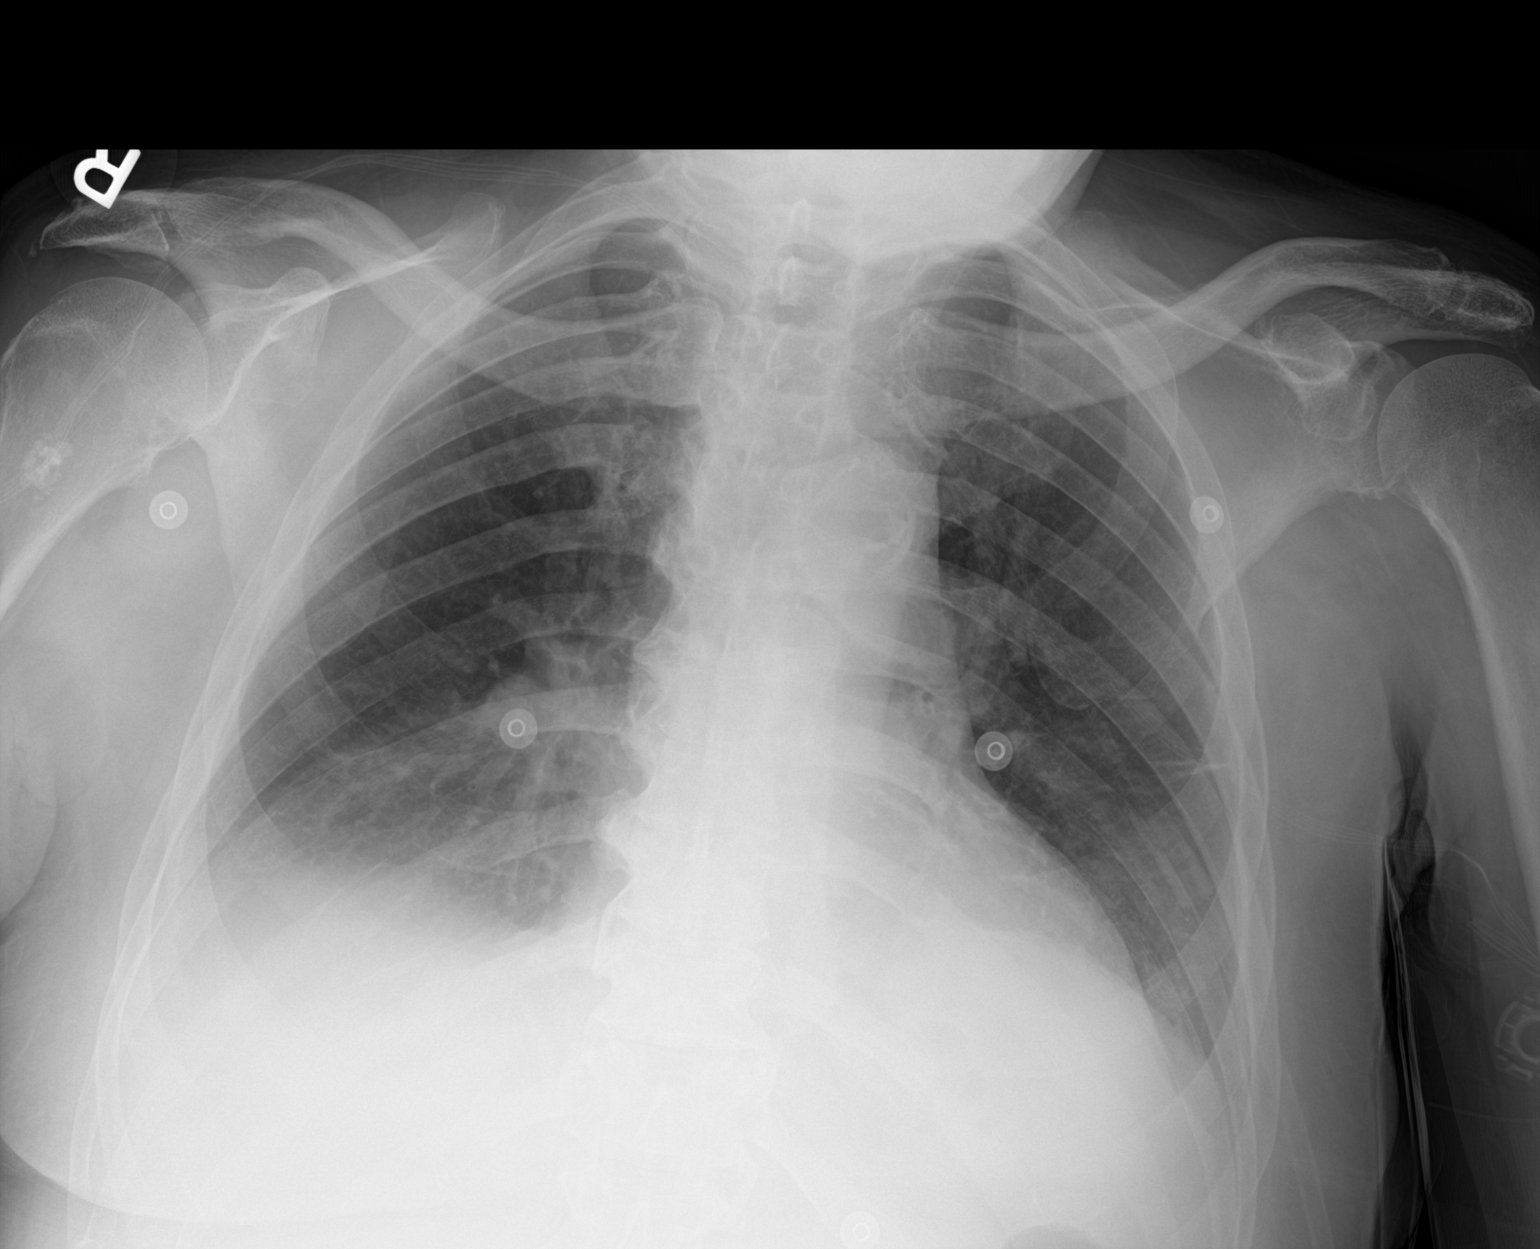

[1 of 1 positions shown; findings below may reference images not displayed]

FINDINGS: There are small pleural effusions bilaterally with bibasilar
atelectasis. There is mild left midlung atelectasis. There is no
consolidation. Heart is upper normal in size with pulmonary
vascularity normal. No adenopathy. There is aortic atherosclerosis.
There is calcification in the proximal humeral diaphysis on the
right.
IMPRESSION: Small pleural effusions bilaterally, slightly larger on the right
than on the left. Bibasilar and left mid lung atelectasis. No
consolidation.

Stable cardiac silhouette. There is aortic atherosclerosis. There is
either a small enchondroma or bone infarct in the proximal right
humeral diaphysis, stable.

Aortic Atherosclerosis ([id number]-S3X.X).

## 2019-01-04 ENCOUNTER — Ambulatory Visit (INDEPENDENT_AMBULATORY_CARE_PROVIDER_SITE_OTHER): Payer: Medicare Other | Admitting: Internal Medicine

## 2019-01-04 VITALS — BP 159/73 | HR 75 | Temp 98.4°F

## 2019-01-04 DIAGNOSIS — A0472 Enterocolitis due to Clostridium difficile, not specified as recurrent: Secondary | ICD-10-CM

## 2019-01-04 DIAGNOSIS — R634 Abnormal weight loss: Secondary | ICD-10-CM

## 2019-01-04 MED ORDER — VANCOMYCIN HCL 125 MG PO CAPS: 125.0000 mg | ORAL_CAPSULE | Freq: Four times a day (QID) | ORAL | 0 refills | Status: DC

## 2019-01-04 NOTE — Progress Notes (Signed)
RFV: follow up to hospitalization, FMT failure  Patient ID: Jeremiah [name], male   DOB: 09-05-1946, 73 y.o.   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 but also hx of PVD, underwent right AKA but subsequently admitted for right leg pain concerning for infection and started on IV abtx (12/11-12/18) in mid December.he was diagnosed with relapse of cdiff on 12/29, just roughly 65 days post FMT.He was treated with 2 wk course of oral vancomycin  Did well until Saturday 4 loose stools, greenish... Monday 3 watery stools .. gave imodium No cramping. No BM last night, and no BM today  Has been at home since end of January- has been home for 3 wk  Outpatient Encounter Medications as of 01/04/2019  Medication Sig  . acetaminophen (TYLENOL) 325 MG tablet Take 2 tablets (650 mg total) by mouth every 6 (six) hours as needed for mild pain (or Fever >/= 101).  Marland Kitchen. amLODipine (NORVASC) 10 MG tablet Take 1 tablet (10 mg total) by mouth daily.  Marland Kitchen. aspirin 81 MG tablet Take 81 mg by mouth daily.  Marland Kitchen. atorvastatin (LIPITOR) 20 MG tablet Take 1 tablet (20 mg total) by mouth daily.  . brimonidine (ALPHAGAN) 0.2 % ophthalmic solution Place 1 drop into both eyes 2 (two) times daily.  . ferrous sulfate 325 (65 FE) MG tablet Take 1 tablet (325 mg total) by mouth 3 (three) times daily with meals.  Marland Kitchen. glimepiride (AMARYL) 1 MG tablet Take 1 tablet (1 mg total) by mouth daily with breakfast.  . losartan (COZAAR) 100 MG tablet Take 1 tablet (100 mg total) by mouth daily.  . metoprolol succinate (TOPROL-XL) 25 MG 24 hr tablet Take 1 tablet (25 mg total) by mouth daily.  . Multiple Vitamin (MULTIVITAMIN WITH MINERALS) TABS tablet Take 1 tablet by mouth daily.  . polycarbophil (FIBERCON) 625 MG tablet Take 2 tablets (1,250 mg total) by mouth daily.  . simvastatin (ZOCOR) 40 MG tablet Take 1 tablet (40 mg total) by mouth daily.  . timolol (TIMOPTIC) 0.5 % ophthalmic  solution Place 1 drop into both eyes 2 (two) times daily.   No facility-administered encounter medications on file as of 01/04/2019.      Patient Active Problem List   Diagnosis Date Noted  . Hypoglycemia   . Hyperkalemia   . Hyponatremia   . Diabetic peripheral neuropathy (HCC)   . Labile blood glucose   . Enteritis due to Clostridium difficile   . Blood glucose abnormal   . Diarrhea   . Benign essential HTN   . Hypoalbuminemia due to protein-calorie malnutrition (HCC)   . Labile blood pressure   . 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 Postoperative pain   . Acute blood loss anemia   . Dyslipidemia   . Type 2 diabetes mellitus with peripheral neuropathy (HCC)   . S/P AKA (above knee amputation) (HCC) 11/12/2018  . Unilateral AKA, right (HCC)   . Hypertensive crisis   . Diabetes mellitus type 2 in nonobese (HCC)   . Pleural effusion 11/08/2018  . Fever   . Open wound of right foot   . Sepsis (HCC) 11/03/2018  . Altered mental status   . Acute cystitis without hematuria   . Leukocytosis   . Pressure injury of skin 07/08/2018  . Clostridium difficile colitis 07/07/2018  . Hypokalemia 07/07/2018  . HLD (hyperlipidemia) 07/07/2018  . Acute diverticulitis 06/04/2018  . Cholecystitis 05/24/2018  .  Cholecystitis with cholelithiasis 05/21/2018  . Acute systolic heart failure (HCC) 03/27/2018  . Diabetes mellitus without complication (HCC) 01/30/2018  . Hypertension 01/30/2018  . Acute cholecystitis 01/30/2018  . AKI (acute kidney injury) (HCC) 01/30/2018  . Normocytic anemia 01/30/2018  . PVD (peripheral vascular disease) (HCC) 09/05/2014     Health Maintenance Due  Topic Date Due  . Hepatitis C Screening  September 05, 1946  . FOOT EXAM  02/14/1954  . OPHTHALMOLOGY EXAM  02/14/1954  . TETANUS/TDAP  02/15/1963  . PNA vac Low Risk Adult (1 of 2 - PCV13) 02/14/2009  . INFLUENZA VACCINE  06/24/2018     Review of Systems Review of Systems  Constitutional: Negative for  fever, chills, diaphoresis, activity change, appetite change, fatigue and unexpected weight change.  HENT: Negative for congestion, sore throat, rhinorrhea, sneezing, trouble swallowing and sinus pressure.  Eyes: Negative for photophobia and visual disturbance.  Respiratory: Negative for cough, chest tightness, shortness of breath, wheezing and stridor.  Cardiovascular: Negative for chest pain, palpitations and leg swelling.  Gastrointestinal: Negative for nausea, vomiting, abdominal pain, diarrhea, constipation, blood in stool, abdominal distention and anal bleeding.  Genitourinary: Negative for dysuria, hematuria, flank pain and difficulty urinating.  Musculoskeletal: Negative for myalgias, back pain, joint swelling, arthralgias and gait problem.  Skin: Negative for color change, pallor, rash and wound.  Neurological: Negative for dizziness, tremors, weakness and light-headedness.  Hematological: Negative for adenopathy. Does not bruise/bleed easily.  Psychiatric/Behavioral: Negative for behavioral problems, confusion, sleep disturbance, dysphoric mood, decreased concentration and agitation.    Physical Exam   BP (!) 159/73   Pulse 75   Temp 98.4 F (36.9 C)    Physical Exam  Constitutional: He is oriented to person, place, and time. He appears well-developed and well-nourished. No distress.  HENT:  Mouth/Throat: Oropharynx is clear and moist. No oropharyngeal exudate.  Cardiovascular: Normal rate, regular rhythm and normal heart sounds. Exam reveals no gallop and no friction rub.  No murmur heard.  Pulmonary/Chest: Effort normal and breath sounds normal. No respiratory distress. He has no wheezes.  Abdominal: Soft. Bowel sounds are normal. He exhibits no distension. There is no tenderness.  Ext: R AKA well healed Neurological: He is alert and oriented to person, place, and time.  Skin: Skin is warm and dry. No rash noted. No erythema.  Psychiatric: He has a normal mood and affect.  His behavior is normal.    CBC Lab Results  Component Value Date   WBC 9.9 12/06/2018   RBC 3.04 (L) 12/06/2018   HGB 8.6 (L) 12/06/2018   HCT 25.9 (L) 12/06/2018   PLT 253 12/06/2018   MCV 85.2 12/06/2018   MCH 28.3 12/06/2018   MCHC 33.2 12/06/2018   RDW 15.3 12/06/2018   LYMPHSABS 2.4 12/06/2018   MONOABS 1.0 12/06/2018   EOSABS 1.3 (H) 12/06/2018    BMET Lab Results  Component Value Date   NA 136 12/06/2018   K 3.8 12/06/2018   CL 106 12/06/2018   CO2 25 12/06/2018   GLUCOSE 136 (H) 12/06/2018   BUN 20 12/06/2018   CREATININE 1.05 12/06/2018   CALCIUM 8.8 (L) 12/06/2018   GFRNONAA >60 12/06/2018   GFRAA >60 12/06/2018      Assessment and Plan  Watery diarrhea = difificult to tell if he is having recurrence of cdifficile vs. Gut dismotility Retest for cdiff. Concern for recurrence since having loose watery stool. Will test. Also maybe candidate for mAb, zimplava  Weight loss = recommend to increase protein intake  since he is below his baseline (30# down)  DM = is well controlled  Prescription in hand if need to restart vancomycin

## 2019-01-07 ENCOUNTER — Telehealth: Payer: Self-pay | Admitting: Behavioral Health

## 2019-01-07 NOTE — Telephone Encounter (Signed)
Patient's wife called stating they are unable to afford Vancomycin capsules for C.diff.  She wanted Dr. Drue Second to be aware to see if there are other options.  Also will send Kathie Rhodes a message in the pharmacy to see if she can assist as well. Angeline Slim RN

## 2019-01-10 NOTE — Telephone Encounter (Signed)
Patient's wife called to follow-up on alternative medication request. Patient's wife states that they are unable to afford vancomycin.Informed patient's wife that we are stilling waiting on a response from Dr. Drue Second, and will update patient once we get orders. Lorenso Courier, [place]

## 2019-01-12 ENCOUNTER — Telehealth: Payer: Self-pay | Admitting: *Deleted

## 2019-01-12 NOTE — Telephone Encounter (Signed)
Beaulah Corin, OT, [facility] left a message asking Dr. Allena Katz to please submit a referral for outpatient occupational therapy.

## 2019-01-13 ENCOUNTER — Other Ambulatory Visit: Payer: Self-pay | Admitting: *Deleted

## 2019-01-13 ENCOUNTER — Other Ambulatory Visit: Payer: BC Managed Care – PPO

## 2019-01-13 DIAGNOSIS — A0472 Enterocolitis due to Clostridium difficile, not specified as recurrent: Secondary | ICD-10-CM

## 2019-01-14 LAB — C. DIFFICILE GDH AND TOXIN A/B
GDH ANTIGEN: NOT DETECTED
MICRO NUMBER:: 222263
SPECIMEN QUALITY:: ADEQUATE
TOXIN A AND B: NOT DETECTED

## 2019-01-17 NOTE — Telephone Encounter (Signed)
Can you ask if betty can work on getting mr Galli,low cost vancomycin or dificid

## 2019-01-19 ENCOUNTER — Telehealth: Payer: Self-pay | Admitting: Pharmacy Technician

## 2019-01-19 NOTE — Telephone Encounter (Signed)
Jeremiah [name] has Medicare, therefore there is no way to get a discount on Vanco.  There is no patient assistance for Vanco either, so I will pursue Dificid through [facility] Patient Assistance program.  I will call him today.

## 2019-01-19 NOTE — Telephone Encounter (Signed)
I called Mr. Wirkkala about helping him to get Dificid patient assistance through [employer].  There was no answer and the voicemail was full.  I will reach out again and will continue to follow.  Netty Starring. Dimas Aguas CPhT Specialty Pharmacy Patient [facility] for Infect[facility] Disease Phone: [phone] Fax:  [phone]

## 2019-01-20 NOTE — Telephone Encounter (Signed)
I reached Mrs. Jeremiah [name], and she states that they are interested in submitting for patient assistance for Dificid.  Since it is difficult for them to travel, given [name] has one leg, I have mailed the [employer] patient assistance application for [name] to sign and return to me.  I included a return envelope.  Mrs. Jeremiah [name] confirmed the number of people in the household and household income.  We will continue to follow.  Jeremiah [name]. Jeremiah [name] CPhT Specialty Pharmacy Patient [facility] for Infectious Disease Phone: [phone] Fax:  [phone]

## 2019-01-31 NOTE — Telephone Encounter (Addendum)
[facility] Patient Advocate Encounter  Completed manufacturer application for Dificid in an effort to reduce patient's out of pocket expense.    Application completed and faxed to [employer] and is approved.  Medication is expedited and will arrive tomorrow at his home 02/01/2019.   Netty Starring. Dimas Aguas CPhT Specialty Pharmacy Patient [facility] for Infectious Disease Phone: [phone] Fax:  [phone]

## 2019-02-01 ENCOUNTER — Ambulatory Visit: Payer: Medicare HMO | Admitting: Internal Medicine

## 2019-02-24 ENCOUNTER — Encounter: Payer: Medicare Other | Attending: Physical Medicine & Rehabilitation | Admitting: Physical Medicine & Rehabilitation

## 2019-02-24 ENCOUNTER — Other Ambulatory Visit: Payer: Self-pay

## 2019-02-24 ENCOUNTER — Encounter: Payer: Self-pay | Admitting: Physical Medicine & Rehabilitation

## 2019-02-24 VITALS — Ht 69.0 in | Wt 164.0 lb

## 2019-02-24 DIAGNOSIS — Z89611 Acquired absence of right leg above knee: Secondary | ICD-10-CM | POA: Insufficient documentation

## 2019-02-24 DIAGNOSIS — I10 Essential (primary) hypertension: Secondary | ICD-10-CM | POA: Insufficient documentation

## 2019-02-24 DIAGNOSIS — E1142 Type 2 diabetes mellitus with diabetic polyneuropathy: Secondary | ICD-10-CM

## 2019-02-24 DIAGNOSIS — R269 Unspecified abnormalities of gait and mobility: Secondary | ICD-10-CM

## 2019-02-24 DIAGNOSIS — G8918 Other acute postprocedural pain: Secondary | ICD-10-CM

## 2019-02-24 NOTE — Progress Notes (Signed)
Subjective:    Patient ID: Jeremiah [name], male    DOB: 02/08/1946, 73 y.o.   MRN: [id number]  TELEHEALTH NOTE  Due to national recommendations of social distancing due to COVID 19, an audio/video telehealth visit is felt to be most appropriate for this patient at this time.  See Chart message from today for the patient's consent to telehealth from [facility].     I verified that I am speaking with the correct person using two identifiers.  Location of patient: Home Location of provider: Office Method of communication: Telephone Established patient Names of participants : Wadie Lessen scheduling, Angelica Chessman obtaining consent and vitals if available Time spent on call: 12 minutes  HPI 73 year old right-handed male with history of diastolic congestive heart failure, diabetes mellitus, recurrent Clostridium difficile presents for follow-up for right AKA.   Last clinic visit on 12/17/2018.  Wife provides history. Since that time, he has completed therapies. He is doing HEP. He is wearing stump shrinker.  Denies pain.  CBGs have been controlled. Denies falls.  Has questions regarding "knot" that migrates.  Attempted to evaluate through description.  Patient states it is nontender and improving.  Pain Inventory Average Pain 0 Pain Right Now 0 My pain is no pain  In the last 24 hours, has pain interfered with the following? General activity 0 Relation with others 0 Enjoyment of life 0 What TIME of day is your pain at its worst? na Sleep (in general) Good  Pain is worse with: no pain Pain improves with: no pain Relief from Meds: no pain  Mobility ability to climb steps?  no do you drive?  no use a wheelchair needs help with transfers  Function not employed: date last employed .  Neuro/Psych trouble walking  Prior Studies Any changes since last visit?  no  Physicians involved in your care Any changes since last visit?  no   Family  History  Problem Relation Age of Onset  . Diabetes Mother   . Hypertension Mother   . Heart disease Father   . Heart attack Father   . Diabetes Sister   . Hypertension Sister   . Diabetes Brother   . Heart disease Brother   . Hypertension Brother   . Heart attack Brother    Social History   Socioeconomic History  . Marital status: Married    Spouse name: Not on file  . Number of children: Not on file  . Years of education: Not on file  . Highest education level: Not on file  Occupational History  . Not on file  Social Needs  . Financial resource strain: Not on file  . Food insecurity:    Worry: Not on file    Inability: Not on file  . Transportation needs:    Medical: Not on file    Non-medical: Not on file  Tobacco Use  . Smoking status: Former Smoker    Years: 24.00    Types: Cigarettes    Last attempt to quit: 11/25/1983    Years since quitting: 35.2  . Smokeless tobacco: Never Used  Substance and Sexual Activity  . Alcohol use: Not Currently  . Drug use: No  . Sexual activity: Not on file  Lifestyle  . Physical activity:    Days per week: Not on file    Minutes per session: Not on file  . Stress: Not on file  Relationships  . Social connections:    Talks on  phone: Not on file    Gets together: Not on file    Attends religious service: Not on file    Active member of club or organization: Not on file    Attends meetings of clubs or organizations: Not on file    Relationship status: Not on file  Other Topics Concern  . Not on file  Social History Narrative  . Not on file   Past Surgical History:  Procedure Laterality Date  . ABDOMINAL AORTOGRAM W/LOWER EXTREMITY N/A 11/01/2018   Procedure: ABDOMINAL AORTOGRAM W/LOWER EXTREMITY;  Surgeon: Runell Gess, MD;  [facility]: [facility];  Service: Cardiovascular;  Laterality: N/A;  . AMPUTATION Right 11/09/2018   Procedure: RIGHT - AMPUTATION ABOVE KNEE;  Surgeon: Maeola Harman, MD;   Location: [facility] OR;  Service: Vascular;  Laterality: Right;  . CATARACT EXTRACTION W/ INTRAOCULAR LENS  IMPLANT, BILATERAL Bilateral   . CHOLECYSTECTOMY  05/21/2018   ATTEMPTED LAPAROSCOPIC CHOLECYSTECTOMY, OPEN DRAINAGE OF GALLBLADDER WITH BIOPSY  . CHOLECYSTECTOMY N/A 05/21/2018   Procedure: ATTEMPTED LAPAROSCOPIC CHOLECYSTECTOMY, OPEN DRAINAGE OF GALLBLADDER WITH BIOPSY;  Surgeon: Griselda Miner, MD;  Location: [facility] OR;  Service: General;  Laterality: N/A;  . COLONOSCOPY W/ POLYPECTOMY    . COLONOSCOPY WITH PROPOFOL N/A 09/16/2018   Procedure: COLONOSCOPY WITH PROPOFOL;  Surgeon: Charlott Rakes, MD;  Location: [facility] ENDOSCOPY;  Service: Endoscopy;  Laterality: N/A;  . 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 IR CATHETER TUBE CHANGE  04/14/2018  . IR CHOLANGIOGRAM EXISTING TUBE  03/17/2018  . IR PERC CHOLECYSTOSTOMY  01/31/2018  . IR RADIOLOGIST EVAL & MGMT  03/02/2018   Past Medical History:  Diagnosis Date  . Arthritis    "joints; shoulders, knees, hands, back" (05/21/2018)  . C. difficile diarrhea 04/2018  . Diastolic CHF (HCC)   . High cholesterol   . History of gout   . Hypertension   . Peripheral vascular disease (HCC)   . Pneumonia    "couple times" (05/21/2018)  . Sleep apnea    "has mask; won't use" (05/21/2018)  . Type II diabetes mellitus (HCC)    Ht 5\' 9"  (1.753 m)   Wt 164 lb (74.4 kg)   BMI 24.22 kg/m   Opioid Risk Score:   Fall Risk Score:  `1  Depression screen PHQ 2/9  Depression screen [facility] 2/9 02/24/2019 09/07/2018 07/29/2018  Decreased Interest 0 0 0  Down, Depressed, Hopeless 0 0 0  PHQ - 2 Score 0 0 0    Review of Systems  Constitutional: Negative.   HENT: Negative.   Eyes: Negative.   Respiratory: Negative.   Cardiovascular: Negative.   Gastrointestinal: Negative.   Endocrine: Negative.   Genitourinary: Negative.   Musculoskeletal:  Positive for gait problem.  Skin: Negative.   Allergic/Immunologic: Negative.   Neurological: Positive for weakness. Negative for numbness.  Hematological: Negative.   Psychiatric/Behavioral: Negative.   All other systems reviewed and are negative.     Objective:   Physical Exam Constitutional: No distress .  Neurological: He is alert and oriented.  HOH Some delay in processing Follows commands.     Assessment & Plan:  73 year old right-handed male with history of diastolic congestive heart failure, diabetes mellitus, recurrent Clostridium difficile presents for follow up for right AKA.   1. Decreased functional mobility secondary to right AKA 11/09/2018  Continue HEP  Cont stump shrinker   2. Pain Management:    Controlled at present  3. Diabetes mellitus with peripheral neuropathy.   Cont meds  Controlled at present per patient  4. Gait abnormality  Cont walker for safety  Cont HEP  5.  Essential hypertension  Patient states machine is non-functional at present  Encouraged follow-up  6. "Knot"  Appears to be muscular.  Encourage patient to use video software to further evaluate.  Nontender and improving.  Encourage patient follow-up if becomes worse.

## 2019-03-01 ENCOUNTER — Other Ambulatory Visit: Payer: Self-pay

## 2019-03-01 DIAGNOSIS — I739 Peripheral vascular disease, unspecified: Secondary | ICD-10-CM

## 2019-03-11 ENCOUNTER — Ambulatory Visit: Payer: BC Managed Care – PPO | Admitting: Family

## 2019-03-11 ENCOUNTER — Encounter (HOSPITAL_COMMUNITY): Payer: BC Managed Care – PPO

## 2019-03-24 ENCOUNTER — Encounter: Payer: Self-pay | Admitting: Physical Medicine & Rehabilitation

## 2019-03-24 ENCOUNTER — Encounter (HOSPITAL_BASED_OUTPATIENT_CLINIC_OR_DEPARTMENT_OTHER): Payer: Medicare Other | Admitting: Physical Medicine & Rehabilitation

## 2019-03-24 ENCOUNTER — Other Ambulatory Visit: Payer: Self-pay

## 2019-03-24 DIAGNOSIS — G8918 Other acute postprocedural pain: Secondary | ICD-10-CM | POA: Diagnosis not present

## 2019-03-24 DIAGNOSIS — I10 Essential (primary) hypertension: Secondary | ICD-10-CM

## 2019-03-24 DIAGNOSIS — R269 Unspecified abnormalities of gait and mobility: Secondary | ICD-10-CM

## 2019-03-24 DIAGNOSIS — Z89611 Acquired absence of right leg above knee: Secondary | ICD-10-CM

## 2019-03-24 DIAGNOSIS — E1142 Type 2 diabetes mellitus with diabetic polyneuropathy: Secondary | ICD-10-CM | POA: Diagnosis not present

## 2019-03-24 DIAGNOSIS — G546 Phantom limb syndrome with pain: Secondary | ICD-10-CM

## 2019-03-24 MED ORDER — GABAPENTIN 100 MG PO CAPS: 100.0000 mg | ORAL_CAPSULE | Freq: Three times a day (TID) | ORAL | 1 refills | Status: DC

## 2019-03-24 NOTE — Progress Notes (Signed)
Subjective:    Patient ID: Jeremiah [name], male    DOB: [birth year], 73 y.o.   MRN: [id number]  TELEHEALTH NOTE  Due to national recommendations of social distancing due to COVID 19, an audio/video telehealth visit is felt to be most appropriate for this patient at this time.  See Chart message from today for the patient's consent to telehealth from [facility].     I verified that I am speaking with the correct person using two identifiers.  Location of patient: Home Location of provider: Office Method of communication: Telephone Established patient Names of participants : Wadie Lessen scheduling, Suezanne Jacquet obtaining consent and vitals if available Time spent on call: 11 minutes   HPI 73 year old right-handed male with history of diastolic congestive heart failure, diabetes mellitus, recurrent Clostridium difficile presents for follow-up for right AKA.   Last clinic visit on 02/24/2019.  Patient is poor historian and HOH. Since that time, he states he has been doing HEP. Pain is relatively controlled. Denies falls, using wheelchair. BP has been controlled.  He states he still has a knot on his stump, appears to indicate it is mobile.   Pain Inventory Average Pain can reach a 10 (phantom pain) Pain Right Now 0 My pain is intermittent, sharp and phantom pain in amputated leg and in remaining foot  In the last 24 hours, has pain interfered with the following? General activity 0 Relation with others 0 Enjoyment of life 0 What TIME of day is your pain at its worst? daytime and night Sleep (in general) Good  Pain is worse with: unsure Pain improves with: medication and nothing really  Relief from Meds: 0  Mobility use a wheelchair needs help with transfers  Function disabled: date disabled . I need assistance with the following:  dressing, bathing, meal prep, household duties and shopping  Neuro/Psych bowel control problems trouble  walking  Prior Studies Any changes since last visit?  no  Physicians involved in your care Any changes since last visit?  no   Family History  Problem Relation Age of Onset  . Diabetes Mother   . Hypertension Mother   . Heart disease Father   . Heart attack Father   . Diabetes Sister   . Hypertension Sister   . Diabetes Brother   . Heart disease Brother   . Hypertension Brother   . Heart attack Brother    Social History   Socioeconomic History  . Marital status: Married    Spouse name: Not on file  . Number of children: Not on file  . Years of education: Not on file  . Highest education level: Not on file  Occupational History  . Not on file  Social Needs  . Financial resource strain: Not on file  . Food insecurity:    Worry: Not on file    Inability: Not on file  . Transportation needs:    Medical: Not on file    Non-medical: Not on file  Tobacco Use  . Smoking status: Former Smoker    Years: 24.00    Types: Cigarettes    Last attempt to quit: 11/25/1983    Years since quitting: 35.3  . Smokeless tobacco: Never Used  Substance and Sexual Activity  . Alcohol use: Not Currently  . Drug use: No  . Sexual activity: Not on file  Lifestyle  . Physical activity:    Days per week: Not on file    Minutes per session:  Not on file  . Stress: Not on file  Relationships  . Social connections:    Talks on phone: Not on file    Gets together: Not on file    Attends religious service: Not on file    Active member of club or organization: Not on file    Attends meetings of clubs or organizations: Not on file    Relationship status: Not on file  Other Topics Concern  . Not on file  Social History Narrative  . Not on file   Past Surgical History:  Procedure Laterality Date  . ABDOMINAL AORTOGRAM W/LOWER EXTREMITY N/A 11/01/2018   Procedure: ABDOMINAL AORTOGRAM W/LOWER EXTREMITY;  Surgeon: Runell Gess, MD;  Location: [facility];  Service:  Cardiovascular;  Laterality: N/A;  . AMPUTATION Right 11/09/2018   Procedure: RIGHT - AMPUTATION ABOVE KNEE;  Surgeon: Maeola Harman, MD;  Location: [facility] OR;  Service: Vascular;  Laterality: Right;  . CATARACT EXTRACTION W/ INTRAOCULAR LENS  IMPLANT, BILATERAL Bilateral   . CHOLECYSTECTOMY  05/21/2018   ATTEMPTED LAPAROSCOPIC CHOLECYSTECTOMY, OPEN DRAINAGE OF GALLBLADDER WITH BIOPSY  . CHOLECYSTECTOMY N/A 05/21/2018   Procedure: ATTEMPTED LAPAROSCOPIC CHOLECYSTECTOMY, OPEN DRAINAGE OF GALLBLADDER WITH BIOPSY;  Surgeon: Griselda Miner, MD;  Location: [facility] OR;  Service: General;  Laterality: N/A;  . COLONOSCOPY W/ POLYPECTOMY    . COLONOSCOPY WITH PROPOFOL N/A 09/16/2018   Procedure: COLONOSCOPY WITH PROPOFOL;  Surgeon: Charlott Rakes, MD;  Location: [facility];  Service: Endoscopy;  Laterality: N/A;  . 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 IR CATHETER TUBE CHANGE  04/14/2018  . IR CHOLANGIOGRAM EXISTING TUBE  03/17/2018  . IR PERC CHOLECYSTOSTOMY  01/31/2018  . IR RADIOLOGIST EVAL & MGMT  03/02/2018   Past Medical History:  Diagnosis Date  . Arthritis    "joints; shoulders, knees, hands, back" (05/21/2018)  . C. difficile diarrhea 04/2018  . Diastolic CHF (HCC)   . High cholesterol   . History of gout   . Hypertension   . Peripheral vascular disease (HCC)   . Pneumonia    "couple times" (05/21/2018)  . Sleep apnea    "has mask; won't use" (05/21/2018)  . Type II diabetes mellitus (HCC)    There were no vitals taken for this visit.  Opioid Risk Score:   Fall Risk Score:  `1  Depression screen PHQ 2/9  Depression screen [facility] 2/9 03/24/2019 02/24/2019 09/07/2018 07/29/2018  Decreased Interest 0 0 0 0  Down, Depressed, Hopeless 0 0 0 0  PHQ - 2 Score 0 0 0 0    Review of Systems  Constitutional: Negative.   HENT: Negative.   Eyes: Negative.    Respiratory: Negative.   Cardiovascular: Negative.   Gastrointestinal: Positive for constipation.       Taking iron  Endocrine: Negative.   Genitourinary: Negative.   Musculoskeletal: Positive for gait problem.  Skin: Negative.   Allergic/Immunologic: Negative.   Neurological: Positive for weakness. Negative for numbness.  Hematological: Negative.   Psychiatric/Behavioral: Negative.   All other systems reviewed and are negative.     Objective:   Physical Exam Constitutional: No distress .  Neurological: He is alert.  HOH Some delay in processing Follows commands.    Assessment & Plan:  73 year old right-handed male with history of diastolic congestive heart failure, diabetes mellitus, recurrent Clostridium difficile presents for follow up for right AKA.  1. Decreased functional mobility secondary to right AKA 11/09/2018             Continue HEP  Cont stump shrinker   2. Pain Management:    Gabapentin 100 TID ordered for phantom pain  3. Gait abnormality  Cont wheelchair for safety  Cont HEP  4.  Essential hypertension  States relatively controlled now  Encouraged follow-up  5. "Knot" - ?mobile  Appears to be muscular.  Encourage patient to use video software to further evaluate again.  Nontender.  Encourage patient follow-up if becomes worse.

## 2019-04-21 ENCOUNTER — Other Ambulatory Visit: Payer: Self-pay

## 2019-04-21 ENCOUNTER — Encounter: Payer: Medicare Other | Attending: Physical Medicine & Rehabilitation | Admitting: Physical Medicine & Rehabilitation

## 2019-04-21 ENCOUNTER — Encounter: Payer: Self-pay | Admitting: Physical Medicine & Rehabilitation

## 2019-04-21 VITALS — Ht 69.0 in | Wt 175.0 lb

## 2019-04-21 DIAGNOSIS — Z89611 Acquired absence of right leg above knee: Secondary | ICD-10-CM | POA: Diagnosis not present

## 2019-04-21 DIAGNOSIS — G546 Phantom limb syndrome with pain: Secondary | ICD-10-CM

## 2019-04-21 DIAGNOSIS — I10 Essential (primary) hypertension: Secondary | ICD-10-CM | POA: Insufficient documentation

## 2019-04-21 DIAGNOSIS — R269 Unspecified abnormalities of gait and mobility: Secondary | ICD-10-CM | POA: Insufficient documentation

## 2019-04-21 DIAGNOSIS — G8918 Other acute postprocedural pain: Secondary | ICD-10-CM | POA: Insufficient documentation

## 2019-04-21 DIAGNOSIS — E1142 Type 2 diabetes mellitus with diabetic polyneuropathy: Secondary | ICD-10-CM | POA: Insufficient documentation

## 2019-04-21 NOTE — Progress Notes (Signed)
Subjective:    Patient ID: Jeremiah [name], male    DOB: November 08, 1946, 73 y.o.   MRN: [id number]  TELEHEALTH NOTE  Due to national recommendations of social distancing due to COVID 19, an audio/video telehealth visit is felt to be most appropriate for this patient at this time.  See Chart message from today for the patient's consent to telehealth from [facility].     I verified that I am speaking with the correct person using two identifiers.  Location of patient: [facility] Location of provider: Office Method of communication: Telephone Established patient Names of participants : Wadie LessenLisa Kellner scheduling, Barbee ShropshireBruce Bright obtaining consent and vitals if available Time spent on call: 10 minutes   HPI Right-handed male with history of diastolic congestive heart failure, diabetes mellitus, recurrent Clostridium difficile presents for follow-up for right AKA.   Last clinic visit on 03/24/2019.  Wife supplements history.  Poor historian. Since that time, he states he continues HEP.  He completed visit with [employer], mold was made. Denies falls. He states he stopped taking Gabapentin because it caused a sore throat.  He is taking tylenol with benefit.  He was told by Prosthetist that "knot" is a bone.  Pain Inventory Average Pain can reach a 10 (phantom pain) Pain Right Now 0 My pain is intermittent, sharp and phantom pain in amputated leg and in remaining foot  In the last 24 hours, has pain interfered with the following? General activity 0 Relation with others 0 Enjoyment of life 0 What TIME of day is your pain at its worst? daytime and night Sleep (in general) Good  Pain is worse with: unsure Pain improves with: medication and nothing really  Relief from Meds: 0  Mobility use a wheelchair needs help with transfers  Function disabled: date disabled . I need assistance with the following:  dressing, bathing, meal prep, household duties and shopping   Neuro/Psych trouble walking  Prior Studies Any changes since last visit?  no  Physicians involved in your care Any changes since last visit?  no   Family History  Problem Relation Age of Onset  . Diabetes Mother   . Hypertension Mother   . Heart disease Father   . Heart attack Father   . Diabetes Sister   . Hypertension Sister   . Diabetes Brother   . Heart disease Brother   . Hypertension Brother   . Heart attack Brother    Social History   Socioeconomic History  . Marital status: Married    Spouse name: Not on file  . Number of children: Not on file  . Years of education: Not on file  . Highest education level: Not on file  Occupational History  . Not on file  Social Needs  . Financial resource strain: Not on file  . Food insecurity:    Worry: Not on file    Inability: Not on file  . Transportation needs:    Medical: Not on file    Non-medical: Not on file  Tobacco Use  . Smoking status: Former Smoker    Years: 24.00    Types: Cigarettes    Last attempt to quit: 11/25/1983    Years since quitting: 35.4  . Smokeless tobacco: Never Used  Substance and Sexual Activity  . Alcohol use: Not Currently  . Drug use: No  . Sexual activity: Not on file  Lifestyle  . Physical activity:    Days per week: Not on file  Minutes per session: Not on file  . Stress: Not on file  Relationships  . Social connections:    Talks on phone: Not on file    Gets together: Not on file    Attends religious service: Not on file    Active member of club or organization: Not on file    Attends meetings of clubs or organizations: Not on file    Relationship status: Not on file  Other Topics Concern  . Not on file  Social History Narrative  . Not on file   Past Surgical History:  Procedure Laterality Date  . ABDOMINAL AORTOGRAM W/LOWER EXTREMITY N/A 11/01/2018   Procedure: ABDOMINAL AORTOGRAM W/LOWER EXTREMITY;  Surgeon: Runell Gess, MD;  Location: [facility];   Service: Cardiovascular;  Laterality: N/A;  . AMPUTATION Right 11/09/2018   Procedure: RIGHT - AMPUTATION ABOVE KNEE;  Surgeon: Maeola Harman, MD;  Location: [facility];  Service: Vascular;  Laterality: Right;  . CATARACT EXTRACTION W/ INTRAOCULAR LENS  IMPLANT, BILATERAL Bilateral   . CHOLECYSTECTOMY  05/21/2018   ATTEMPTED LAPAROSCOPIC CHOLECYSTECTOMY, OPEN DRAINAGE OF GALLBLADDER WITH BIOPSY  . CHOLECYSTECTOMY N/A 05/21/2018   Procedure: ATTEMPTED LAPAROSCOPIC CHOLECYSTECTOMY, OPEN DRAINAGE OF GALLBLADDER WITH BIOPSY;  Surgeon: Griselda Miner, MD;  Location: [facility] OR;  Service: General;  Laterality: N/A;  . COLONOSCOPY W/ POLYPECTOMY    . COLONOSCOPY WITH PROPOFOL N/A 09/16/2018   Procedure: COLONOSCOPY WITH PROPOFOL;  Surgeon: Charlott Rakes, MD;  Location: [facility];  Service: Endoscopy;  Laterality: N/A;  . 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 IR CATHETER TUBE CHANGE  04/14/2018  . IR CHOLANGIOGRAM EXISTING TUBE  03/17/2018  . IR PERC CHOLECYSTOSTOMY  01/31/2018  . IR RADIOLOGIST EVAL & MGMT  03/02/2018   Past Medical History:  Diagnosis Date  . Arthritis    "joints; shoulders, knees, hands, back" (05/21/2018)  . C. difficile diarrhea 04/2018  . Diastolic CHF (HCC)   . High cholesterol   . History of gout   . Hypertension   . Peripheral vascular disease (HCC)   . Pneumonia    "couple times" (05/21/2018)  . Sleep apnea    "has mask; won't use" (05/21/2018)  . Type II diabetes mellitus (HCC)    Ht 5\' 9"  (1.753 m)   Wt 175 lb (79.4 kg)   BMI 25.84 kg/m   Opioid Risk Score:   Fall Risk Score:  `1  Depression screen PHQ 2/9  Depression screen [facility] 2/9 03/24/2019 02/24/2019 09/07/2018 07/29/2018  Decreased Interest 0 0 0 0  Down, Depressed, Hopeless 0 0 0 0  PHQ - 2 Score 0 0 0 0    Review of Systems  Constitutional: Negative.   HENT: Negative.    Eyes: Negative.   Respiratory: Negative.   Cardiovascular: Negative.   Gastrointestinal: Negative.        Taking iron  Endocrine: Negative.   Genitourinary: Negative.   Musculoskeletal: Positive for gait problem.  Skin: Negative.   Allergic/Immunologic: Negative.   Neurological: Positive for weakness. Negative for numbness.  Hematological: Negative.   Psychiatric/Behavioral: Negative.   All other systems reviewed and are negative.     Objective:   Physical Exam Constitutional: No distress .  Neurological: He is alert.  HOH Some delay in processing Follows commands.    Assessment & Plan:  Right-handed male with history of diastolic congestive heart failure, diabetes  mellitus, recurrent Clostridium difficile presents for follow up for right AKA.   1. Decreased functional mobility secondary to right AKA 11/09/2018             Cont HEP  Cont stump shrinker   Currently being molded   2. Pain Management:    Gabapentin 100 TID causes sore throat  Cont Tylenol PRN  3. Gait abnormality  Cont wheelchair for safety  Cont HEP  4. "Knot"  Told my prosthetist, bone

## 2019-06-23 ENCOUNTER — Encounter: Payer: Medicare Other | Attending: Physical Medicine & Rehabilitation | Admitting: Physical Medicine & Rehabilitation

## 2019-06-23 DIAGNOSIS — Z89611 Acquired absence of right leg above knee: Secondary | ICD-10-CM | POA: Insufficient documentation

## 2019-06-23 DIAGNOSIS — E1142 Type 2 diabetes mellitus with diabetic polyneuropathy: Secondary | ICD-10-CM | POA: Insufficient documentation

## 2019-06-23 DIAGNOSIS — I10 Essential (primary) hypertension: Secondary | ICD-10-CM | POA: Insufficient documentation

## 2019-06-23 DIAGNOSIS — R269 Unspecified abnormalities of gait and mobility: Secondary | ICD-10-CM | POA: Insufficient documentation

## 2019-06-23 DIAGNOSIS — G8918 Other acute postprocedural pain: Secondary | ICD-10-CM | POA: Insufficient documentation

## 2019-08-16 ENCOUNTER — Telehealth: Payer: Self-pay

## 2019-08-16 NOTE — Telephone Encounter (Signed)
He needs a follow up appointment.  Thanks.  

## 2019-08-16 NOTE — Telephone Encounter (Signed)
Patients wife called requesting prescription for pain medication for this patient as his pain levels has increased.

## 2019-08-17 NOTE — Telephone Encounter (Signed)
Called to inform patient of information.  They stated that the medication was no longer needed and did not want to schedule.

## 2019-08-23 ENCOUNTER — Telehealth: Payer: Self-pay

## 2019-08-23 NOTE — Telephone Encounter (Signed)
Son [name] called requesting therapy for pt. He is receiving a prostatic leg today and is in of therapy with the prostatic leg. Advised son to call back when he has his schedule to schedule an appt for pt for f/u.

## 2019-08-29 ENCOUNTER — Other Ambulatory Visit: Payer: Self-pay | Admitting: Vascular Surgery

## 2019-08-29 DIAGNOSIS — Z89611 Acquired absence of right leg above knee: Secondary | ICD-10-CM

## 2019-08-29 DIAGNOSIS — R269 Unspecified abnormalities of gait and mobility: Secondary | ICD-10-CM

## 2019-08-29 DIAGNOSIS — I739 Peripheral vascular disease, unspecified: Secondary | ICD-10-CM

## 2019-08-29 NOTE — Telephone Encounter (Signed)
Thank you :)

## 2019-09-05 ENCOUNTER — Other Ambulatory Visit: Payer: Self-pay | Admitting: Physical Medicine & Rehabilitation

## 2019-09-06 NOTE — Telephone Encounter (Signed)
Recieved electronic medication refill request for gabapentin medication.  Noticed in patients last visit note:  2. Pain Management:              Gabapentin 100 TID causes sore throat  Unsure if ok to refill this medication based on that information.

## 2019-09-13 ENCOUNTER — Other Ambulatory Visit: Payer: Self-pay | Admitting: Physical Medicine & Rehabilitation

## 2019-09-22 ENCOUNTER — Encounter: Payer: Medicare Other | Admitting: Physical Medicine & Rehabilitation

## 2019-11-03 ENCOUNTER — Encounter: Payer: Self-pay | Admitting: Physical Therapy

## 2019-11-03 ENCOUNTER — Other Ambulatory Visit: Payer: Self-pay

## 2019-11-03 ENCOUNTER — Ambulatory Visit (INDEPENDENT_AMBULATORY_CARE_PROVIDER_SITE_OTHER): Payer: Medicare Other | Admitting: Physical Therapy

## 2019-11-03 DIAGNOSIS — R293 Abnormal posture: Secondary | ICD-10-CM | POA: Diagnosis not present

## 2019-11-03 DIAGNOSIS — R2689 Other abnormalities of gait and mobility: Secondary | ICD-10-CM

## 2019-11-03 DIAGNOSIS — M6281 Muscle weakness (generalized): Secondary | ICD-10-CM

## 2019-11-03 DIAGNOSIS — M25651 Stiffness of right hip, not elsewhere classified: Secondary | ICD-10-CM

## 2019-11-03 DIAGNOSIS — R2681 Unsteadiness on feet: Secondary | ICD-10-CM | POA: Diagnosis not present

## 2019-11-03 DIAGNOSIS — M25672 Stiffness of left ankle, not elsewhere classified: Secondary | ICD-10-CM

## 2019-11-03 DIAGNOSIS — M25652 Stiffness of left hip, not elsewhere classified: Secondary | ICD-10-CM

## 2019-11-03 DIAGNOSIS — M25662 Stiffness of left knee, not elsewhere classified: Secondary | ICD-10-CM

## 2019-11-03 NOTE — Therapy (Signed)
[facility] [address] [place], [place], [address] Phone: [phone]   Fax:  [phone]  Physical Therapy Evaluation  Patient Details  Name: Jeremiah [name] MRN: [id number] Date of Birth: [birth year] Referring Provider (PT): Annita Brod, MD   Encounter Date: 11/03/2019  PT End of Session - 11/03/19 1340    Visit Number  1    Number of Visits  45    Date for PT Re-Evaluation  02/01/20    Authorization Type  [employer] Medicare & BCBS    PT Start Time  1015    PT Stop Time  1120    PT Time Calculation (min)  65 min    Equipment Utilized During Treatment  Gait belt    Activity Tolerance  Patient tolerated treatment well    Behavior During Therapy  [facility] for tasks assessed/performed       Past Medical History:  Diagnosis Date  . Arthritis    "joints; shoulders, knees, hands, back" (05/21/2018)  . C. difficile diarrhea 04/2018  . Diastolic CHF (HCC)   . High cholesterol   . History of gout   . Hypertension   . Peripheral vascular disease (HCC)   . Pneumonia    "couple times" (05/21/2018)  . Sleep apnea    "has mask; won't use" (05/21/2018)  . Type II diabetes mellitus (HCC)     Past Surgical History:  Procedure Laterality Date  . ABDOMINAL AORTOGRAM W/LOWER EXTREMITY N/A 11/01/2018   Procedure: ABDOMINAL AORTOGRAM W/LOWER EXTREMITY;  Surgeon: Runell Gess, MD;  Location: [facility] INVASIVE CV LAB;  Service: Cardiovascular;  Laterality: N/A;  . AMPUTATION Right 11/09/2018   Procedure: RIGHT - AMPUTATION ABOVE KNEE;  Surgeon: Maeola Harman, MD;  Location: [facility] OR;  Service: Vascular;  Laterality: Right;  . CATARACT EXTRACTION W/ INTRAOCULAR LENS  IMPLANT, BILATERAL Bilateral   . CHOLECYSTECTOMY  05/21/2018   ATTEMPTED LAPAROSCOPIC CHOLECYSTECTOMY, OPEN DRAINAGE OF GALLBLADDER WITH BIOPSY  . CHOLECYSTECTOMY N/A 05/21/2018   Procedure: ATTEMPTED LAPAROSCOPIC CHOLECYSTECTOMY, OPEN DRAINAGE OF GALLBLADDER WITH BIOPSY;  Surgeon: Griselda Miner, MD;  Location: [facility] OR;  Service: General;  Laterality: N/A;  . COLONOSCOPY W/ POLYPECTOMY    . COLONOSCOPY WITH PROPOFOL N/A 09/16/2018   Procedure: COLONOSCOPY WITH PROPOFOL;  Surgeon: Charlott Rakes, MD;  Location: [facility] ENDOSCOPY;  Service: Endoscopy;  Laterality: N/A;  . 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 IR CATHETER TUBE CHANGE  04/14/2018  . IR CHOLANGIOGRAM EXISTING TUBE  03/17/2018  . IR PERC CHOLECYSTOSTOMY  01/31/2018  . IR RADIOLOGIST EVAL & MGMT  03/02/2018    There were no vitals filed for this visit.   Subjective Assessment - 11/03/19 1025    Subjective  This 73yo male was referred for PT by Annita Brod, MD. He underwent a right Transfemoral Amputation on 11/09/2018. He received prosthesis on 08/23/2019. He had HHPT until ~Oct. 2020.    Pertinent History  R TFA, CHF, HTN, DM2, neuropathy, PVD, arthritis, gout, visual impairment Rt eye, glaucoma    Limitations  Lifting;Standing;Walking;House hold activities    Patient Stated Goals  To use prosthesis to walk in home & community and drive again.    Currently in Pain?  Yes    Pain Score  10-Worst pain ever    Pain Location  Buttocks    Pain Orientation  Right    Pain Descriptors / Indicators  Lambert Mody  Pain Type  Acute pain    Pain Onset  1 to 4 weeks ago    Pain Frequency  Constant    Aggravating Factors   boil on right ischuim, sitting with weight on area    Pain Relieving Factors  laying down to get weight off area         [facility] PT Assessment - 11/03/19 1015      Assessment   Medical Diagnosis  Right Transfemoral Amputation    Referring Provider (PT)  Annita BrodPhilip Asenso, MD    Onset Date/Surgical Date  10/25/19   MD referral to PT   Hand Dominance  Right    Prior Therapy  HHPT but did not work with prosthesis      Precautions   Precautions  Fall      Balance Screen   Has the  patient fallen in the past 6 months  No    Has the patient had a decrease in activity level because of a fear of falling?   Yes    Is the patient reluctant to leave their home because of a fear of falling?   Yes      Home Environment   Living Environment  Private residence    Living Arrangements  Spouse/significant other   wife is w/c bound, 14yo granddaughter at times   Type of Home  House    Home Access  Ramped entrance    Home Layout  One level    Home Equipment  Wheelchair - manual;Walker - 2 wheels;Cane - single point;Bedside commode;Tub bench      Prior Function   Level of Independence  Independent with household mobility with device;Independent with community mobility with device    Vocation  Retired      Metallurgistosture/Postural Control   Posture/Postural Control  Postural limitations    Postural Limitations  Rounded Shoulders;Forward head;Flexed trunk;Weight shift left      ROM / Strength   AROM / PROM / Strength  PROM;Strength      PROM   Overall PROM   Deficits    Right Hip Extension  -25   thomas supine   Left Hip Extension  -15   thomas supine   Left Knee Extension  -21    Left Ankle Dorsiflexion  -18      Strength   Overall Strength  Deficits    Overall Strength Comments  BUEs grossly WFL     Right Hip Flexion  4/5    Right Hip Extension  3-/5    Right Hip ABduction  3-/5    Left Hip Flexion  4/5    Left Hip Extension  2+/5    Left Hip ABduction  3-/5    Left Knee Flexion  3-/5    Left Knee Extension  4/5    Left Ankle Dorsiflexion  4/5      Transfers   Transfers  Sit to Stand;Stand to Sit;Lateral/Scoot Transfers;Supine to Sit;Sit to Supine    Sit to Stand  3: Mod assist;With upper extremity assist;With armrests;From chair/3-in-1;From elevated surface   to RW with PT manually controlling prosthetic knee   Sit to Stand Details  Verbal cues for sequencing;Verbal cues for technique;Verbal cues for safe use of DME/AE;Manual facilitation for weight shifting     Stand to Sit  4: Min assist;With upper extremity assist;With armrests;To chair/3-in-1;To elevated surface;Other (comment)   from RW with PT unlocking prosthetic knee   Lateral/Scoot Transfers  5: Supervision;With armrests removed   w/c &  mat table same ht and touching together   Lateral/Scoot Transfer Details (indicate cue type and reason)  reports first transfer without sliding board    Supine to Sit  4: Min assist;With upper extremity assist;Other (comment)   Pt reports home bed rails to pull up, head of bed elevated    Sit to Supine  5: Supervision;With upper extremity assist;Uncontrolled descent;Other (comment)   Pt reports home bed rails to pull up, head of bed elevated      Ambulation/Gait   Ambulation/Gait  Yes    Ambulation/Gait Assistance  2: Max assist    Ambulation/Gait Assistance Details  PT manually advancing & controlling knee for prosthesis. Manual & verbal cues to wt shift forward over prosthesis.     Ambulation Distance (Feet)  3 Feet    Assistive device  Parallel bars;Prosthesis    Gait Pattern  Step-to pattern;Decreased step length - left;Decreased stance time - right;Decreased hip/knee flexion - right;Decreased weight shift to right;Right hip hike;Antalgic;Lateral hip instability;Trunk flexed    Ambulation Surface  Indoor;Level      Balance   Balance Assessed  Yes      Static Standing Balance   Static Standing - Balance Support  Bilateral upper extremity supported   RW support, PT locked & positioned prosthesis   Static Standing - Level of Assistance  4: Min assist    Static Standing - Comment/# of Minutes  maintains upright 30 seconds multiple attempts.       Dynamic Standing Balance   Dynamic Standing - Balance Support  Bilateral upper extremity supported;Right upper extremity supported;Left upper extremity supported   reaching with LUE & RUE with unilateral support   Dynamic Standing - Level of Assistance  3: Mod assist   RW and PT positioning prosthesis /  locking knee for stabilit   Dynamic Standing - Balance Activities  Eyes opn;Head nods;Head turns;Reaching for objects    Dynamic Standing - Comments  looks right/left & up/down with cervical motion only (no trunk motion or wt shift)  and reaches to anterior RW only with either UE (wthin length of his arm)      Prosthetics Assessment - 11/03/19 1015      Prosthetics   Prosthetic Care Dependent with  Skin check;Residual limb care;Care of non-amputated limb;Prosthetic cleaning;Ply sock cleaning;Correct ply sock adjustment;Proper wear schedule/adjustment;Proper weight-bearing schedule/adjustment    Donning prosthesis   Max assist;Min assist   MinA liner & MaxA prosthesis   Doffing prosthesis   Min assist    Current prosthetic wear tolerance (days/week)   none outside of prosthetist office    Current prosthetic wear tolerance (#hours/day)   none outside of prothetist office    Current prosthetic weight-bearing tolerance (hours/day)   No c/o limb pain or LBP with standing for 3 minutes or walking 3'      Edema  none    Residual limb condition   dry skin, gray coloring from dry skin, normal temperature, conical shape.      Prosthesis Description  SAFETY Knee (unlocks with unweighting) with extension assist, silicon liner with velcro lanyard attachment,  single axis foot    K code/activity level with prosthetic use   K2 limited community                Objective measurements completed on examination: See above findings.      [facility] Adult PT Treatment/Exercise - 11/03/19 1015      Prosthetics   Prosthetic Care Comments   wear liner only 2hrs 2x/day  Education Provided  Skin check;Residual limb care;Prosthetic cleaning;Correct ply sock adjustment;Proper Donning;Proper wear schedule/adjustment    Person(s) Educated  Patient;Child(ren)    Education Method  Explanation;Verbal cues;Demonstration;Tactile cues    Education Method  Verbalized understanding;Needs further instruction                PT Short Term Goals - 11/03/19 1358      PT SHORT TERM GOAL #1   Title  Patient demonstrates ability to donne liner with supervision and donne prosthesis with modA. (All STGs Target Date 12/23/2019)    Time  4    Period  Weeks    Status  New    Target Date  12/23/19      PT SHORT TERM GOAL #2   Title  Patient tolerates wear of liner daily >6 hrs total per day and prosthesis up to 1 hr with family or PT present.    Time  4    Period  Weeks    Status  New    Target Date  12/23/19      PT SHORT TERM GOAL #3   Title  Patient sit to/from stand from elevated w/c to RW with prosthesis with modA.    Time  4    Period  Weeks    Status  New    Target Date  12/23/19      PT SHORT TERM GOAL #4   Title  Patient standing balance with RW support & prosthesis static 1 minute with supervision and scans with cervical motion with contact assist.    Time  4    Period  Weeks    Status  New    Target Date  12/23/19      PT SHORT TERM GOAL #5   Title  Patient ambulates 5' in parallel bars with prosthesis with modA.    Time  4    Period  Weeks    Status  New    Target Date  12/23/19        PT Long Term Goals - 11/03/19 1353      PT LONG TERM GOAL #1   Title  Patient verbalizes & demonstrates proper prosthetic care including modified independent donning to enable safe utilization of prosthesis (All LTGs Target Date: 04/27/2020)    Time  6    Period  Months    Status  New    Target Date  04/27/20      PT LONG TERM GOAL #2   Title  Patient tolerates wear of prosthesis >75% of awake hours without skin or limb pain issues to enable function during his day.    Time  6    Period  Months    Status  New    Target Date  04/27/20      PT LONG TERM GOAL #3   Title  Standing balance with RW support & prosthesis: reaches 5" anteriorly, picks up object from floor, scans environment & manages clothes  modified independent.    Time  6    Period  Months    Status  New    Target  Date  04/27/20      PT LONG TERM GOAL #4   Title  Patient ambulates 200' with RW & prosthesis with supervision.    Time  6    Period  Months    Status  New    Target Date  04/27/20      PT LONG TERM GOAL #5   Title  Patient negotiates ramps & curbs with RW & prosthesis with supervision.    Time  6    Period  Months    Status  New    Target Date  04/27/20             Plan - 11/03/19 1343    Clinical Impression Statement  This 73yo male underwent a right Transfemoral Amputation 11/09/2018 and received his first prosthesis on 08/23/2019. He has not had any PT as HHPT discharged prior to prosthesis delivery. He is dependent in all prosthetic care and requires maxA to donne prosthesis. He has not worn prosthesis or liner outside of prosthetist's office.  He requires maxA to stand safely with prosthesis. Standing balance requires minA for static & modA for dynamic.  Prosthetic gait in parallel bars with PT manually managing prosthesis in swing & stance with maxA for only 3'.  This patient has significant weakness, stiffness, impaired endurance and posture from prolonged immobility.  Patient would benefit from skilled PT to improve his ability to use the prosthesis and improve his safety & mobility.    Personal Factors and Comorbidities  Age;Comorbidity 3+;Fitness;Past/Current Experience;Time since onset of injury/illness/exacerbation;Transportation    Comorbidities  R TFA, CHF, HTN, DM2, neuropathy, PVD, arthritis, gout, visual impairment Rt eye, glaucoma,    Examination-Activity Limitations  Bed Mobility;Locomotion Level;Reach Overhead;Stairs;Stand;Toileting;Transfers    Stability/Clinical Decision Making  Evolving/Moderate complexity    Clinical Decision Making  Moderate    Rehab Potential  Good    PT Frequency  2x / week    PT Duration  Other (comment)   22 weeks   PT Treatment/Interventions  ADLs/Self Care Home Management;DME Instruction;Gait training;Stair training;Functional  mobility training;Therapeutic activities;Therapeutic exercise;Balance training;Neuromuscular re-education;Patient/family education;Prosthetic Training    PT Next Visit Plan  review prosthetic care including donning, work on sit to/from stand and standing balance    Recommended Other Services  PT will attempt prosthetic training without knee locked but may need to have manual lock added to knee for safety.    Consulted and Agree with Plan of Care  Patient;Family member/caregiver    Family Member Consulted  son, [name]       Patient will benefit from skilled therapeutic intervention in order to improve the following deficits and impairments:  Abnormal gait, Decreased activity tolerance, Decreased balance, Decreased endurance, Decreased knowledge of use of DME, Decreased mobility, Decreased range of motion, Decreased skin integrity, Decreased strength, Impaired flexibility, Postural dysfunction, Prosthetic Dependency  Visit Diagnosis: Other abnormalities of gait and mobility  Unsteadiness on feet  Abnormal posture  Muscle weakness  Stiffness of right hip, not elsewhere classified  Stiffness of left knee, not elsewhere classified  Stiffness of left ankle, not elsewhere classified  Stiffness of left hip, not elsewhere classified     Problem List Patient Active Problem List   Diagnosis Date Noted  . Post-operative pain 04/21/2019  . Abnormality of gait 03/24/2019  . Hypoglycemia   . Hyperkalemia   . Hyponatremia   . Diabetic peripheral neuropathy ([place])   . Labile blood glucose   . Enteritis due to Clostridium difficile   . Blood glucose abnormal   . Diarrhea   . Benign essential HTN   . Hypoalbuminemia due to protein-calorie malnutrition ([place])   . Labile blood pressure   . 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 Postoperative pain   . Acute blood loss anemia   . Dyslipidemia   . Type 2 diabetes mellitus with peripheral neuropathy (HCC)   . S/P AKA (  above knee  amputation) (HCC) 11/12/2018  . Unilateral AKA, right (HCC)   . Hypertensive crisis   . Diabetes mellitus type 2 in nonobese (HCC)   . Pleural effusion 11/08/2018  . Fever   . Open wound of right foot   . Sepsis (HCC) 11/03/2018  . Altered mental status   . Acute cystitis without hematuria   . Leukocytosis   . Pressure injury of skin 07/08/2018  . Clostridium difficile colitis 07/07/2018  . Hypokalemia 07/07/2018  . HLD (hyperlipidemia) 07/07/2018  . Acute diverticulitis 06/04/2018  . Cholecystitis 05/24/2018  . Cholecystitis with cholelithiasis 05/21/2018  . Acute systolic heart failure (HCC) 03/27/2018  . Diabetes mellitus without complication (HCC) 01/30/2018  . Hypertension 01/30/2018  . Acute cholecystitis 01/30/2018  . AKI (acute kidney injury) (HCC) 01/30/2018  . Normocytic anemia 01/30/2018  . PVD (peripheral vascular disease) (HCC) 09/05/2014    Vladimir Faster PT, DPT 11/03/2019, 2:13 PM  [facility] [address] [place], [place], [address] Phone: [phone]   Fax:  [phone]  Name: Jeremiah [name] MRN: [id number] Date of Birth: 1946/09/18

## 2019-11-28 ENCOUNTER — Other Ambulatory Visit: Payer: Self-pay

## 2019-11-28 ENCOUNTER — Ambulatory Visit: Payer: Medicare Other | Attending: Cardiology | Admitting: Physical Therapy

## 2019-11-28 ENCOUNTER — Encounter: Payer: Self-pay | Admitting: Physical Therapy

## 2019-11-28 DIAGNOSIS — M25662 Stiffness of left knee, not elsewhere classified: Secondary | ICD-10-CM | POA: Insufficient documentation

## 2019-11-28 DIAGNOSIS — M6281 Muscle weakness (generalized): Secondary | ICD-10-CM | POA: Diagnosis present

## 2019-11-28 DIAGNOSIS — M25652 Stiffness of left hip, not elsewhere classified: Secondary | ICD-10-CM

## 2019-11-28 DIAGNOSIS — R2689 Other abnormalities of gait and mobility: Secondary | ICD-10-CM | POA: Insufficient documentation

## 2019-11-28 DIAGNOSIS — R293 Abnormal posture: Secondary | ICD-10-CM | POA: Diagnosis present

## 2019-11-28 DIAGNOSIS — M25651 Stiffness of right hip, not elsewhere classified: Secondary | ICD-10-CM | POA: Diagnosis present

## 2019-11-28 DIAGNOSIS — M25672 Stiffness of left ankle, not elsewhere classified: Secondary | ICD-10-CM

## 2019-11-28 DIAGNOSIS — R2681 Unsteadiness on feet: Secondary | ICD-10-CM | POA: Diagnosis present

## 2019-11-28 NOTE — Therapy (Signed)
[facility] [address] [place], [place], [address] Phone: [phone]   Fax:  [phone]  Physical Therapy Treatment  Patient Details  Name: Jeremiah [name] MRN: [id number] Date of Birth: 1946/01/31 Referring Provider (PT): Annita Brod, MD   Encounter Date: 11/28/2019  PT End of Session - 11/28/19 1232    Visit Number  2    Number of Visits  45    Date for PT Re-Evaluation  02/01/20    Authorization Type  [employer] Medicare & [employer]BS    PT Start Time  1015    PT Stop Time  1100    PT Time Calculation (min)  45 min    Equipment Utilized During Treatment  Gait belt    Activity Tolerance  Patient tolerated treatment well    Behavior During Therapy  [facility] for tasks assessed/performed       Past Medical History:  Diagnosis Date  . Arthritis    "joints; shoulders, knees, hands, back" (05/21/2018)  . C. difficile diarrhea 04/2018  . Diastolic CHF (HCC)   . High cholesterol   . History of gout   . Hypertension   . Peripheral vascular disease (HCC)   . Pneumonia    "couple times" (05/21/2018)  . Sleep apnea    "has mask; won't use" (05/21/2018)  . Type II diabetes mellitus (HCC)     Past Surgical History:  Procedure Laterality Date  . ABDOMINAL AORTOGRAM W/LOWER EXTREMITY N/A 11/01/2018   Procedure: ABDOMINAL AORTOGRAM W/LOWER EXTREMITY;  Surgeon: Runell Gess, MD;  Location: [facility] INVASIVE CV LAB;  Service: Cardiovascular;  Laterality: N/A;  . AMPUTATION Right 11/09/2018   Procedure: RIGHT - AMPUTATION ABOVE KNEE;  Surgeon: Maeola Harman, MD;  Location: [facility] OR;  Service: Vascular;  Laterality: Right;  . CATARACT EXTRACTION W/ INTRAOCULAR LENS  IMPLANT, BILATERAL Bilateral   . CHOLECYSTECTOMY  05/21/2018   ATTEMPTED LAPAROSCOPIC CHOLECYSTECTOMY, OPEN DRAINAGE OF GALLBLADDER WITH BIOPSY  . CHOLECYSTECTOMY N/A 05/21/2018   Procedure: ATTEMPTED LAPAROSCOPIC CHOLECYSTECTOMY, OPEN DRAINAGE OF GALLBLADDER WITH  BIOPSY;  Surgeon: Griselda Miner, MD;  Location: [facility] OR;  Service: General;  Laterality: N/A;  . COLONOSCOPY W/ POLYPECTOMY    . COLONOSCOPY WITH PROPOFOL N/A 09/16/2018   Procedure: COLONOSCOPY WITH PROPOFOL;  Surgeon: Charlott Rakes, MD;  Location: [facility] ENDOSCOPY;  Service: Endoscopy;  Laterality: N/A;  . 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 IR CATHETER TUBE CHANGE  04/14/2018  . IR CHOLANGIOGRAM EXISTING TUBE  03/17/2018  . IR PERC CHOLECYSTOSTOMY  01/31/2018  . IR RADIOLOGIST EVAL & MGMT  03/02/2018    There were no vitals filed for this visit.  Subjective Assessment - 11/28/19 1015    Subjective  He has been wearing the liner every other day for ~2 hrs and prosthesis only when family come over.    Pertinent History  R TFA, CHF, HTN, DM2, neuropathy, PVD, arthritis, gout, visual impairment Rt eye, glaucoma    Limitations  Lifting;Standing;Walking;House hold activities    Patient Stated Goals  To use prosthesis to walk in home & community and drive again.    Currently in Pain?  No/denies    Pain Onset  1 to 4 weeks ago                       [facility] Adult PT Treatment/Exercise - 11/28/19 1015  Transfers   Transfers  Sit to Stand;Stand to Sit;Lateral/Scoot Transfers;Supine to Sit;Sit to Supine    Sit to Stand  3: Mod assist;With upper extremity assist;With armrests;From chair/3-in-1;From elevated surface   to RW with PT manually controlling prosthetic knee   Sit to Stand Details  Verbal cues for sequencing;Verbal cues for technique;Verbal cues for safe use of DME/AE;Manual facilitation for weight shifting    Sit to Stand Details (indicate cue type and reason)  PT verbal & manual/tactile cues on technique with TFA prosthesis.  Pt improved with reps requiring less assistance.     Stand to Sit  4: Min assist;With upper extremity assist;With  armrests;To chair/3-in-1;To elevated surface;Other (comment)   from RW with PT unlocking prosthetic knee   Stand to Sit Details (indicate cue type and reason)  Visual cues for safe use of DME/AE;Verbal cues for safe use of DME/AE;Verbal cues for technique;Manual facilitation for weight shifting    Stand to Sit Details  PT verbal & manual/tactile cues on technique with TFA prosthesis.  Pt improved with reps requiring less assistance.     Lateral/Scoot Transfers  --    Supine to Sit  --    Sit to Supine  --      Ambulation/Gait   Ambulation/Gait  Yes    Ambulation/Gait Assistance  2: Max assist    Ambulation/Gait Assistance Details  Manual/tactile, verbal & demo cues on sequence, initial contact with heel contact with prosthetic knee extension, wt shift over prosthesis in stance and prosthesis advancement.     Ambulation Distance (Feet)  5 Feet   5' X 3   Assistive device  Parallel bars;Prosthesis    Gait Pattern  Step-to pattern;Decreased step length - left;Decreased stance time - right;Decreased hip/knee flexion - right;Decreased weight shift to right;Right hip hike;Antalgic;Lateral hip instability;Trunk flexed    Ambulation Surface  Level;Indoor      Posture/Postural Control   Posture/Postural Control  --    Postural Limitations  --      Neuro Re-ed    Neuro Re-ed Details   In parallel bars with PT positioning prosthesis for knee extension:  Static with UE support with minA/guard,  scan to side only right/left with MinA and sliding hands on parallel bars- single UE with minA & BUEs at same time with ModA.       Prosthetics   Prosthetic Care Comments   wear liner only 3hrs 2x/day every day.  Only wear prosthesis if son is available.  Put right footrest on w/c when prosthesis is on limb for safety.  PT demo/verbal cues on donning liner correctly.  Use lotion at night to decrease dry skin.      Current prosthetic wear tolerance (days/week)   liner only every other day    Current prosthetic  wear tolerance (#hours/day)   up to 2hrs    Current prosthetic weight-bearing tolerance (hours/day)   No c/o limb pain or LBP with standing for 3 minutes or walking 3'      Edema  none    Residual limb condition   dry skin, gray coloring from dry skin, normal temperature, conical shape.      Education Provided  Skin check;Residual limb care;Prosthetic cleaning;Correct ply sock adjustment;Proper Donning;Proper wear schedule/adjustment;Other (comment)   see prosthetic care comments   Person(s) Educated  Patient    Education Method  Explanation;Demonstration;Tactile cues;Verbal cues    Education Method  Verbalized understanding;Returned demonstration;Tactile cues required;Verbal cues required;Needs further instruction    Donning Prosthesis  Minimal assist;Maximum  assist   minA for liner & sitting portion, MaxA standing portion              PT Short Term Goals - 11/28/19 1246      PT SHORT TERM GOAL #1   Title  Patient demonstrates ability to donne liner with supervision and donne prosthesis with modA. (All STGs Target Date 12/23/2019)    Time  4    Period  Weeks    Status  On-going    Target Date  12/23/19      PT SHORT TERM GOAL #2   Title  Patient tolerates wear of liner daily >6 hrs total per day and prosthesis up to 1 hr with family or PT present.    Time  4    Period  Weeks    Status  On-going    Target Date  12/23/19      PT SHORT TERM GOAL #3   Title  Patient sit to/from stand from elevated w/c to RW with prosthesis with modA.    Time  4    Period  Weeks    Status  On-going    Target Date  12/23/19      PT SHORT TERM GOAL #4   Title  Patient standing balance with RW support & prosthesis static 1 minute with supervision and scans with cervical motion with contact assist.    Time  4    Period  Weeks    Status  On-going    Target Date  12/23/19      PT SHORT TERM GOAL #5   Title  Patient ambulates 5' in parallel bars with prosthesis with modA.    Time  4     Period  Weeks    Status  On-going    Target Date  12/23/19        PT Long Term Goals - 11/03/19 1353      PT LONG TERM GOAL #1   Title  Patient verbalizes & demonstrates proper prosthetic care including modified independent donning to enable safe utilization of prosthesis (All LTGs Target Date: 04/27/2020)    Time  6    Period  Months    Status  New    Target Date  04/27/20      PT LONG TERM GOAL #2   Title  Patient tolerates wear of prosthesis >75% of awake hours without skin or limb pain issues to enable function during his day.    Time  6    Period  Months    Status  New    Target Date  04/27/20      PT LONG TERM GOAL #3   Title  Standing balance with RW support & prosthesis: reaches 5" anteriorly, picks up object from floor, scans environment & manages clothes  modified independent.    Time  6    Period  Months    Status  New    Target Date  04/27/20      PT LONG TERM GOAL #4   Title  Patient ambulates 200' with RW & prosthesis with supervision.    Time  6    Period  Months    Status  New    Target Date  04/27/20      PT LONG TERM GOAL #5   Title  Patient negotiates ramps & curbs with RW & prosthesis with supervision.    Time  6    Period  Months    Status  New  Target Date  04/27/20            Plan - 11/28/19 1246    Clinical Impression Statement  PT educated patient on proper donning of liner and daily wear.  PT worked on sit/stand and stationary standing balance in parallel bars with manual /tactile cues & verbal cues to engage prosthesis.  PT also introduced prosthetic gait in parallel bars for 5' X3. He improved his ability to engage prosthesis & wt shift over prosthesis with instruction.    Personal Factors and Comorbidities  Age;Comorbidity 3+;Fitness;Past/Current Experience;Time since onset of injury/illness/exacerbation;Transportation    Comorbidities  R TFA, CHF, HTN, DM2, neuropathy, PVD, arthritis, gout, visual impairment Rt eye, glaucoma,     Examination-Activity Limitations  Bed Mobility;Locomotion Level;Reach Overhead;Stairs;Stand;Toileting;Transfers    Stability/Clinical Decision Making  Evolving/Moderate complexity    Rehab Potential  Good    PT Frequency  2x / week    PT Duration  Other (comment)   22 weeks   PT Treatment/Interventions  ADLs/Self Care Home Management;DME Instruction;Gait training;Stair training;Functional mobility training;Therapeutic activities;Therapeutic exercise;Balance training;Neuromuscular re-education;Patient/family education;Prosthetic Training    PT Next Visit Plan  review prosthetic care including donning, work on sit to/from stand including at sink to progress to HEP and standing balance and prosthetic gait in parallel bars.    Consulted and Agree with Plan of Care  Patient;Family member/caregiver    Family Member Consulted  son, Arlys JohnBrian       Patient will benefit from skilled therapeutic intervention in order to improve the following deficits and impairments:  Abnormal gait, Decreased activity tolerance, Decreased balance, Decreased endurance, Decreased knowledge of use of DME, Decreased mobility, Decreased range of motion, Decreased skin integrity, Decreased strength, Impaired flexibility, Postural dysfunction, Prosthetic Dependency  Visit Diagnosis: Unsteadiness on feet  Other abnormalities of gait and mobility  Abnormal posture  Muscle weakness  Stiffness of right hip, not elsewhere classified  Stiffness of left knee, not elsewhere classified  Stiffness of left ankle, not elsewhere classified  Stiffness of left hip, not elsewhere classified     Problem List Patient Active Problem List   Diagnosis Date Noted  . Post-operative pain 04/21/2019  . Abnormality of gait 03/24/2019  . Hypoglycemia   . Hyperkalemia   . Hyponatremia   . Diabetic peripheral neuropathy (HCC)   . Labile blood glucose   . Enteritis due to Clostridium difficile   . Blood glucose abnormal   . Diarrhea    . Benign essential HTN   . Hypoalbuminemia due to protein-calorie malnutrition (HCC)   . Labile blood pressure   . 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 Postoperative pain   . Acute blood loss anemia   . Dyslipidemia   . Type 2 diabetes mellitus with peripheral neuropathy (HCC)   . S/P AKA (above knee amputation) (HCC) 11/12/2018  . Unilateral AKA, right (HCC)   . Hypertensive crisis   . Diabetes mellitus type 2 in nonobese (HCC)   . Pleural effusion 11/08/2018  . Fever   . Open wound of right foot   . Sepsis (HCC) 11/03/2018  . Altered mental status   . Acute cystitis without hematuria   . Leukocytosis   . Pressure injury of skin 07/08/2018  . Clostridium difficile colitis 07/07/2018  . Hypokalemia 07/07/2018  . HLD (hyperlipidemia) 07/07/2018  . Acute diverticulitis 06/04/2018  . Cholecystitis 05/24/2018  . Cholecystitis with cholelithiasis 05/21/2018  . Acute systolic heart failure (HCC) 03/27/2018  . Diabetes mellitus without complication (HCC) 01/30/2018  . Hypertension  01/30/2018  . Acute cholecystitis 01/30/2018  . AKI (acute kidney injury) (HCC) 01/30/2018  . Normocytic anemia 01/30/2018  . PVD (peripheral vascular disease) (HCC) 09/05/2014    Vladimir Faster PT, DPT 11/28/2019, 12:51 PM  [facility] [address] [place], [place], [address] Phone: [phone]   Fax:  [phone]  Name: Jeremiah [name] MRN: [id number] Date of Birth: 1946/02/21

## 2019-12-02 ENCOUNTER — Ambulatory Visit: Payer: Medicare Other | Admitting: Physical Therapy

## 2019-12-05 ENCOUNTER — Ambulatory Visit: Payer: Medicare Other | Admitting: Physical Therapy

## 2019-12-07 ENCOUNTER — Ambulatory Visit: Payer: Medicare Other | Admitting: Physical Therapy

## 2019-12-12 ENCOUNTER — Encounter: Payer: Self-pay | Admitting: Physical Therapy

## 2019-12-13 ENCOUNTER — Encounter: Payer: Medicare Other | Admitting: Physical Therapy

## 2019-12-15 ENCOUNTER — Other Ambulatory Visit: Payer: Self-pay

## 2019-12-15 ENCOUNTER — Encounter: Payer: Self-pay | Admitting: Physical Therapy

## 2019-12-15 ENCOUNTER — Ambulatory Visit (INDEPENDENT_AMBULATORY_CARE_PROVIDER_SITE_OTHER): Payer: Medicare Other | Admitting: Physical Therapy

## 2019-12-15 DIAGNOSIS — R2689 Other abnormalities of gait and mobility: Secondary | ICD-10-CM

## 2019-12-15 DIAGNOSIS — M25651 Stiffness of right hip, not elsewhere classified: Secondary | ICD-10-CM

## 2019-12-15 DIAGNOSIS — M25652 Stiffness of left hip, not elsewhere classified: Secondary | ICD-10-CM

## 2019-12-15 DIAGNOSIS — R293 Abnormal posture: Secondary | ICD-10-CM | POA: Diagnosis not present

## 2019-12-15 DIAGNOSIS — R2681 Unsteadiness on feet: Secondary | ICD-10-CM | POA: Diagnosis not present

## 2019-12-15 DIAGNOSIS — M25662 Stiffness of left knee, not elsewhere classified: Secondary | ICD-10-CM

## 2019-12-15 NOTE — Therapy (Signed)
[facility] Physical Therapy [address] [place], [place], [address] Phone: [phone]   Fax:  [phone]  Physical Therapy Treatment  Patient Details  Name: Jeremiah [name] MRN: [id number] Date of Birth: 12/17/1945 Referring Provider (PT): Annita Brod, MD   Encounter Date: 12/15/2019  PT End of Session - 12/15/19 1021    Visit Number  3    Number of Visits  45    Date for PT Re-Evaluation  02/01/20    Authorization Type  [employer] Medicare & [employer]BS    PT Start Time  1015    PT Stop Time  1100    PT Time Calculation (min)  45 min    Equipment Utilized During Treatment  Gait belt    Activity Tolerance  Patient tolerated treatment well    Behavior During Therapy  [facility] for tasks assessed/performed       Past Medical History:  Diagnosis Date  . Arthritis    "joints; shoulders, knees, hands, back" (05/21/2018)  . C. difficile diarrhea 04/2018  . Diastolic CHF (HCC)   . High cholesterol   . History of gout   . Hypertension   . Peripheral vascular disease (HCC)   . Pneumonia    "couple times" (05/21/2018)  . Sleep apnea    "has mask; won't use" (05/21/2018)  . Type II diabetes mellitus (HCC)     Past Surgical History:  Procedure Laterality Date  . ABDOMINAL AORTOGRAM W/LOWER EXTREMITY N/A 11/01/2018   Procedure: ABDOMINAL AORTOGRAM W/LOWER EXTREMITY;  Surgeon: Runell Gess, MD;  Location: [facility] INVASIVE CV LAB;  Service: Cardiovascular;  Laterality: N/A;  . AMPUTATION Right 11/09/2018   Procedure: RIGHT - AMPUTATION ABOVE KNEE;  Surgeon: Maeola Harman, MD;  Location: [facility] OR;  Service: Vascular;  Laterality: Right;  . CATARACT EXTRACTION W/ INTRAOCULAR LENS  IMPLANT, BILATERAL Bilateral   . CHOLECYSTECTOMY  05/21/2018   ATTEMPTED LAPAROSCOPIC CHOLECYSTECTOMY, OPEN DRAINAGE OF GALLBLADDER WITH BIOPSY  . CHOLECYSTECTOMY N/A 05/21/2018   Procedure: ATTEMPTED LAPAROSCOPIC CHOLECYSTECTOMY, OPEN DRAINAGE OF GALLBLADDER WITH BIOPSY;  Surgeon: Griselda Miner, MD;  Location: [facility] OR;  Service: General;  Laterality: N/A;  . COLONOSCOPY W/ POLYPECTOMY    . COLONOSCOPY WITH PROPOFOL N/A 09/16/2018   Procedure: COLONOSCOPY WITH PROPOFOL;  Surgeon: Charlott Rakes, MD;  Location: [facility] ENDOSCOPY;  Service: Endoscopy;  Laterality: N/A;  . 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 IR CATHETER TUBE CHANGE  04/14/2018  . IR CHOLANGIOGRAM EXISTING TUBE  03/17/2018  . IR PERC CHOLECYSTOSTOMY  01/31/2018  . IR RADIOLOGIST EVAL & MGMT  03/02/2018    There were no vitals filed for this visit.  Subjective Assessment - 12/15/19 1020    Subjective  He is wearing the liner 3-4 days/week for 4 hours. Showed up to session without prosthesis. He reports that he had it ready sitting beside the door. But when transportation showed up to take him to PT he forgot to grab it.    Pertinent History  R TFA, CHF, HTN, DM2, neuropathy, PVD, arthritis, gout, visual impairment Rt eye, glaucoma    Limitations  Lifting;Standing;Walking;House hold activities    Patient Stated Goals  To use prosthesis to walk in home & community and drive again.    Currently in Pain?  No/denies    Pain Onset  1 to 4 weeks ago  [facility] Adult PT Treatment/Exercise - 12/15/19 1022      Transfers   Transfers  Sit to Stand;Stand to American ExpressSit;Lateral/Scoot Transfers;Supine to Sit;Sit to Supine    Sit to Stand  With upper extremity assist;From elevated surface;4: Min assist;From bed   from elevated mat to RW   Sit to Stand Details  Verbal cues for sequencing;Verbal cues for technique;Verbal cues for safe use of DME/AE;Manual facilitation for weight shifting    Stand to Sit  4: Min assist;With upper extremity assist;To elevated surface;Other (comment)   from RW to elevated mat    Stand to Sit Details (indicate cue type and reason)  Visual cues for safe use  of DME/AE;Verbal cues for safe use of DME/AE;Verbal cues for technique;Manual facilitation for weight shifting    Comments  Performed sit>stands from elevated mat to RW. Once in standing on LLE, pt performed R active hip extension. Verbal and tactile cues for extension. Isometric contract for 5s at end range. Repeated x5 reps.       Ambulation/Gait   Ambulation/Gait  --    Ambulation/Gait Assistance  --    Ambulation Distance (Feet)  --    Assistive device  --    Gait Pattern  --      Neuro Re-ed    Neuro Re-ed Details   --      Exercises   Exercises  Knee/Hip      Knee/Hip Exercises: Supine   Hip Adduction Isometric  Strengthening;Both;10 reps   ball squeeze; with bolster under bilat femur    Hip Adduction Isometric Limitations  verbal cues for 3-5s hold     Bridges  Strengthening;AROM;Both;5 reps   with bolter under bilat femurs; 3-5s hold   Bridges Limitations  verbal and tactile cues for hip extenison and muscle activation     Other Supine Knee/Hip Exercises  Isometric hip abduction into gait belt. LLE 3-5s hold > RLE 3-5s hold > bilateral contraction 3-5s hold. x10 reps   with bolster under bilat femur    Other Supine Knee/Hip Exercises  In modified Thomas test position supported by therapist - performed contract-relax for right hip flexor stretch x10 reps.      Knee/Hip Exercises: Sidelying   Hip ABduction  Strengthening;AROM;Both;10 reps   3-5s hold    Hip ABduction Limitations  verbal and tactile cues for availble range of motion and to prevent hip flexion     Other Sidelying Knee/Hip Exercises  Pt performed active hip extension x10 reps with verbal and tactile cues for form. Performed isometric hold x3-5s at end range.       Prosthetics   Prosthetic Care Comments   PT advised to continue liner wear for 4 hours but increase frequency to daily.  Only wear prosthesis if son is available    Current prosthetic wear tolerance (days/week)   wear linre ~3-4 days a week      Current prosthetic wear tolerance (#hours/day)   up to 4 hours     Current prosthetic weight-bearing tolerance (hours/day)   --    Edema  none    Residual limb condition   dry skin, gray coloring from dry skin, normal temperature, conical shape.      Education Provided  Proper wear schedule/adjustment;Other (comment)   see prosthetic care comments   Person(s) Educated  Patient    Education Method  Explanation    Education Method  Verbalized understanding               PT Short  Term Goals - 11/28/19 1246      PT SHORT TERM GOAL #1   Title  Patient demonstrates ability to donne liner with supervision and donne prosthesis with modA. (All STGs Target Date 12/23/2019)    Time  4    Period  Weeks    Status  On-going    Target Date  12/23/19      PT SHORT TERM GOAL #2   Title  Patient tolerates wear of liner daily >6 hrs total per day and prosthesis up to 1 hr with family or PT present.    Time  4    Period  Weeks    Status  On-going    Target Date  12/23/19      PT SHORT TERM GOAL #3   Title  Patient sit to/from stand from elevated w/c to RW with prosthesis with modA.    Time  4    Period  Weeks    Status  On-going    Target Date  12/23/19      PT SHORT TERM GOAL #4   Title  Patient standing balance with RW support & prosthesis static 1 minute with supervision and scans with cervical motion with contact assist.    Time  4    Period  Weeks    Status  On-going    Target Date  12/23/19      PT SHORT TERM GOAL #5   Title  Patient ambulates 5' in parallel bars with prosthesis with modA.    Time  4    Period  Weeks    Status  On-going    Target Date  12/23/19        PT Long Term Goals - 11/03/19 1353      PT LONG TERM GOAL #1   Title  Patient verbalizes & demonstrates proper prosthetic care including modified independent donning to enable safe utilization of prosthesis (All LTGs Target Date: 04/27/2020)    Time  6    Period  Months    Status  New    Target Date   04/27/20      PT LONG TERM GOAL #2   Title  Patient tolerates wear of prosthesis >75% of awake hours without skin or limb pain issues to enable function during his day.    Time  6    Period  Months    Status  New    Target Date  04/27/20      PT LONG TERM GOAL #3   Title  Standing balance with RW support & prosthesis: reaches 5" anteriorly, picks up object from floor, scans environment & manages clothes  modified independent.    Time  6    Period  Months    Status  New    Target Date  04/27/20      PT LONG TERM GOAL #4   Title  Patient ambulates 200' with RW & prosthesis with supervision.    Time  6    Period  Months    Status  New    Target Date  04/27/20      PT LONG TERM GOAL #5   Title  Patient negotiates ramps & curbs with RW & prosthesis with supervision.    Time  6    Period  Months    Status  New    Target Date  04/27/20            Plan - 12/15/19 1021    Clinical Impression  Statement  Pt presented to PT session without prosthesis today. Session focused on LE stretching & strengthening and facilitating greater range of motion. Pt demonstrated significantly limited hip extension d/t weakness and hip flexor contractures. He requires extensive verbal & tactile cueing with each activity. Pt can benefit from continued skilled therapy to address deficits.    Personal Factors and Comorbidities  Age;Comorbidity 3+;Fitness;Past/Current Experience;Time since onset of injury/illness/exacerbation;Transportation    Comorbidities  R TFA, CHF, HTN, DM2, neuropathy, PVD, arthritis, gout, visual impairment Rt eye, glaucoma,    Examination-Activity Limitations  Bed Mobility;Locomotion Level;Reach Overhead;Stairs;Stand;Toileting;Transfers    Stability/Clinical Decision Making  Evolving/Moderate complexity    Rehab Potential  Good    PT Frequency  2x / week    PT Duration  Other (comment)   22 weeks   PT Treatment/Interventions  ADLs/Self Care Home Management;DME Instruction;Gait  training;Stair training;Functional mobility training;Therapeutic activities;Therapeutic exercise;Balance training;Neuromuscular re-education;Patient/family education;Prosthetic Training    PT Next Visit Plan  begin assessing STGs (due 12/23/19),review prosthetic care including donning, work on sit to/from stand including at sink to progress to HEP and standing balance and prosthetic gait in parallel bars., LE stretching/strengthening    Consulted and Agree with Plan of Care  Patient    Family Member Consulted  --       Patient will benefit from skilled therapeutic intervention in order to improve the following deficits and impairments:  Abnormal gait, Decreased activity tolerance, Decreased balance, Decreased endurance, Decreased knowledge of use of DME, Decreased mobility, Decreased range of motion, Decreased skin integrity, Decreased strength, Impaired flexibility, Postural dysfunction, Prosthetic Dependency  Visit Diagnosis: Unsteadiness on feet  Other abnormalities of gait and mobility  Abnormal posture  Stiffness of right hip, not elsewhere classified  Stiffness of left knee, not elsewhere classified  Stiffness of left hip, not elsewhere classified     Problem List Patient Active Problem List   Diagnosis Date Noted  . Post-operative pain 04/21/2019  . Abnormality of gait 03/24/2019  . Hypoglycemia   . Hyperkalemia   . Hyponatremia   . Diabetic peripheral neuropathy (HCC)   . Labile blood glucose   . Enteritis due to Clostridium difficile   . Blood glucose abnormal   . Diarrhea   . Benign essential HTN   . Hypoalbuminemia due to protein-calorie malnutrition (HCC)   . Labile blood pressure   . 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 Postoperative pain   . Acute blood loss anemia   . Dyslipidemia   . Type 2 diabetes mellitus with peripheral neuropathy (HCC)   . S/P AKA (above knee amputation) (HCC) 11/12/2018  . Unilateral AKA, right (HCC)   . Hypertensive crisis    . Diabetes mellitus type 2 in nonobese (HCC)   . Pleural effusion 11/08/2018  . Fever   . Open wound of right foot   . Sepsis (HCC) 11/03/2018  . Altered mental status   . Acute cystitis without hematuria   . Leukocytosis   . Pressure injury of skin 07/08/2018  . Clostridium difficile colitis 07/07/2018  . Hypokalemia 07/07/2018  . HLD (hyperlipidemia) 07/07/2018  . Acute diverticulitis 06/04/2018  . Cholecystitis 05/24/2018  . Cholecystitis with cholelithiasis 05/21/2018  . Acute systolic heart failure (HCC) 03/27/2018  . Diabetes mellitus without complication (HCC) 01/30/2018  . Hypertension 01/30/2018  . Acute cholecystitis 01/30/2018  . AKI (acute kidney injury) (HCC) 01/30/2018  . Normocytic anemia 01/30/2018  . PVD (peripheral vascular disease) (HCC) 09/05/2014   Blima Ledger SPT 12/15/2019, 4:51 PM  Zella Ball  Cain Saupe PT, DPT 12/15/2019, 6:50 PM  [facility] Physical Therapy [address] [place], [place], [address] Phone: [phone]   Fax:  [phone]  Name: Jeremiah [name] MRN: [id number] Date of Birth: 02-07-1946

## 2019-12-16 ENCOUNTER — Encounter: Payer: Self-pay | Admitting: Physical Therapy

## 2019-12-19 ENCOUNTER — Encounter: Payer: Self-pay | Admitting: Physical Therapy

## 2019-12-20 ENCOUNTER — Ambulatory Visit (INDEPENDENT_AMBULATORY_CARE_PROVIDER_SITE_OTHER): Payer: Medicare Other | Admitting: Physical Therapy

## 2019-12-20 ENCOUNTER — Encounter: Payer: Self-pay | Admitting: Physical Therapy

## 2019-12-20 ENCOUNTER — Other Ambulatory Visit: Payer: Self-pay

## 2019-12-20 DIAGNOSIS — M25652 Stiffness of left hip, not elsewhere classified: Secondary | ICD-10-CM

## 2019-12-20 DIAGNOSIS — R2689 Other abnormalities of gait and mobility: Secondary | ICD-10-CM

## 2019-12-20 DIAGNOSIS — R2681 Unsteadiness on feet: Secondary | ICD-10-CM | POA: Diagnosis not present

## 2019-12-20 DIAGNOSIS — M25651 Stiffness of right hip, not elsewhere classified: Secondary | ICD-10-CM

## 2019-12-20 DIAGNOSIS — R293 Abnormal posture: Secondary | ICD-10-CM | POA: Diagnosis not present

## 2019-12-20 DIAGNOSIS — M6281 Muscle weakness (generalized): Secondary | ICD-10-CM | POA: Diagnosis not present

## 2019-12-20 DIAGNOSIS — M25662 Stiffness of left knee, not elsewhere classified: Secondary | ICD-10-CM

## 2019-12-20 NOTE — Therapy (Signed)
[facility] [place], [place], [address] Phone: [phone]   Fax:  [phone]  Physical Therapy Treatment  Patient Details  Name: Jeremiah [name] MRN: [id number] Date of Birth: 07/10/1946 Referring Provider (PT): Clovia Cuff, MD   Encounter Date: 12/20/2019  PT End of Session - 12/20/19 1211    Visit Number  4    Number of Visits  45    Date for PT Re-Evaluation  02/01/20    Authorization Type  [employer] Medicare & [employer]BS    PT Start Time  1018    PT Stop Time  1100    PT Time Calculation (min)  42 min    Equipment Utilized During Treatment  Gait belt    Activity Tolerance  Patient tolerated treatment well    Behavior During Therapy  WFL for tasks assessed/performed       Past Medical History:  Diagnosis Date  . Arthritis    "joints; shoulders, knees, hands, back" (05/21/2018)  . C. difficile diarrhea 04/2018  . Diastolic CHF ([place])   . High cholesterol   . History of gout   . Hypertension   . Peripheral vascular disease ([place])   . Pneumonia    "couple times" (05/21/2018)  . Sleep apnea    "has mask; won't use" (05/21/2018)  . Type II diabetes mellitus ([place])     Past Surgical History:  Procedure Laterality Date  . ABDOMINAL AORTOGRAM W/LOWER EXTREMITY N/A 11/01/2018   Procedure: ABDOMINAL AORTOGRAM W/LOWER EXTREMITY;  Surgeon: Lorretta Harp, MD;  Location: [facility];  Service: Cardiovascular;  Laterality: N/A;  . AMPUTATION Right 11/09/2018   Procedure: RIGHT - AMPUTATION ABOVE KNEE;  Surgeon: Waynetta Sandy, MD;  Location: [facility];  Service: Vascular;  Laterality: Right;  . CATARACT EXTRACTION W/ INTRAOCULAR LENS  IMPLANT, BILATERAL Bilateral   . CHOLECYSTECTOMY  05/21/2018   ATTEMPTED LAPAROSCOPIC CHOLECYSTECTOMY, OPEN DRAINAGE OF GALLBLADDER WITH BIOPSY  . CHOLECYSTECTOMY N/A 05/21/2018   Procedure: ATTEMPTED LAPAROSCOPIC CHOLECYSTECTOMY, OPEN DRAINAGE OF GALLBLADDER WITH BIOPSY;  Surgeon: Jovita Kussmaul, MD;  Location: [facility];  Service: General;  Laterality: N/A;  . COLONOSCOPY W/ POLYPECTOMY    . COLONOSCOPY WITH PROPOFOL N/A 09/16/2018   Procedure: COLONOSCOPY WITH PROPOFOL;  Surgeon: Wilford Corner, MD;  Location: [facility];  Service: Endoscopy;  Laterality: N/A;  . FECAL TRANSPLANT N/A 09/16/2018   Procedure: FECAL TRANSPLANT;  Surgeon: Wilford Corner, MD;  Location: [facility];  Service: Endoscopy;  Laterality: N/A;  . IR CATHETER TUBE CHANGE  04/14/2018  . IR CHOLANGIOGRAM EXISTING TUBE  03/17/2018  . IR PERC CHOLECYSTOSTOMY  01/31/2018  . IR RADIOLOGIST EVAL & MGMT  03/02/2018    There were no vitals filed for this visit.  Subjective Assessment - 12/20/19 1020    Subjective  He has been wearing the liner ~8 hrs/day with no skin issues.    Pertinent History  R TFA, CHF, HTN, DM2, neuropathy, PVD, arthritis, gout, visual impairment Rt eye, glaucoma    Limitations  Lifting;Standing;Walking;House hold activities    Patient Stated Goals  To use prosthesis to walk in home & community and drive again.    Currently in Pain?  No/denies    Pain Onset  1 to 4 weeks ago                       [facility] Adult PT Treatment/Exercise - 12/20/19 1020      Transfers   Transfers  Sit  to Stand;Stand to Sit;Lateral/Scoot Transfers;Supine to Sit;Sit to Supine    Sit to Stand  3: Mod assist;With upper extremity assist;With armrests;From chair/3-in-1;From elevated surface   pulling on //bars & PT manual assist prosthetic knee   Sit to Stand Details  Verbal cues for sequencing;Verbal cues for technique;Verbal cues for safe use of DME/AE;Manual facilitation for weight shifting;Visual cues for safe use of DME/AE    Sit to Stand Details (indicate cue type and reason)  verbal, demo & manual cues to extend prosthetic knee with arising.  5 reps during session with pt able to extend knee with minimal assist by last rep.     Stand to Sit  4: Min assist;With upper extremity assist;With  armrests;To chair/3-in-1;To elevated surface;Other (comment)   from //bars   Stand to Sit Details (indicate cue type and reason)  Visual cues for safe use of DME/AE;Verbal cues for safe use of DME/AE;Verbal cues for technique;Manual facilitation for weight shifting    Stand to Sit Details  verbal & manual cues to unlock prosthesis to sit down      Ambulation/Gait   Ambulation/Gait  Yes    Ambulation/Gait Assistance  3: Mod assist   2nd person for safety   Ambulation/Gait Assistance Details  verbal, manual/tactile & visual cues on step length, initial contact with heel to extend prosthetic knee, wt shift over prosthesis in stance and upright posture.     Ambulation Distance (Feet)  5 Feet   5' X 2   Assistive device  Parallel bars;Prosthesis    Gait Pattern  Step-to pattern;Decreased step length - left;Decreased stance time - right;Decreased hip/knee flexion - right;Decreased weight shift to right;Right hip hike;Antalgic;Lateral hip instability;Trunk flexed    Ambulation Surface  Indoor;Level      Neuro Re-ed    Neuro Re-ed Details   standing balance in parallel bars with BUE support:  weight shifts right/midline/left/midline and anterior/posterior with proprioception for midfoot, over toes & over heels  and moving UEs forward/back on parallel bars.       Prosthetics   Prosthetic Care Comments   wear liner only ~8hrs/day.  PT demo & verbal cues on difference b/w liner & shrinker. Need to not wear liner over night so skin does not stay too wet.     Current prosthetic wear tolerance (days/week)   liner only daily    Current prosthetic wear tolerance (#hours/day)   ~8 hrs    Current prosthetic weight-bearing tolerance (hours/day)   No c/o limb pain or LBP with standing for 3 minutes or walking 3'      Edema  none    Residual limb condition   dry skin, gray coloring from dry skin, normal temperature, conical shape.      Education Provided  Skin check;Residual limb care;Prosthetic  cleaning;Correct ply sock adjustment;Proper Donning;Proper wear schedule/adjustment;Other (comment)   see prosthetic care comments   Person(s) Educated  Patient    Education Method  Explanation;Verbal cues    Education Method  Verbalized understanding;Verbal cues required;Needs further instruction    Donning Prosthesis  Supervision               PT Short Term Goals - 12/20/19 1231      PT SHORT TERM GOAL #1   Title  Patient demonstrates ability to donne liner with supervision and donne prosthesis with modA. (All STGs Target Date 12/23/2019)    Baseline  MET 12/20/2019    Time  4    Period  Weeks    Status  Achieved    Target Date  12/23/19      PT SHORT TERM GOAL #2   Title  Patient tolerates wear of liner daily >6 hrs total per day and prosthesis up to 1 hr with family or PT present.    Baseline  Partially MET 12/20/2019 wearing liner >6hrs but not prosthesis as family not around his house much.    Time  4    Period  Weeks    Status  Partially Met    Target Date  12/23/19      PT SHORT TERM GOAL #3   Title  Patient sit to/from stand from elevated w/c to RW with prosthesis with modA.    Time  4    Period  Weeks    Status  On-going    Target Date  12/23/19      PT SHORT TERM GOAL #4   Title  Patient standing balance with RW support & prosthesis static 1 minute with supervision and scans with cervical motion with contact assist.    Time  4    Period  Weeks    Status  On-going    Target Date  12/23/19      PT SHORT TERM GOAL #5   Title  Patient ambulates 5' in parallel bars with prosthesis with modA.    Baseline  MET 12/20/2019    Time  4    Period  Weeks    Status  Achieved    Target Date  12/23/19        PT Long Term Goals - 11/03/19 1353      PT LONG TERM GOAL #1   Title  Patient verbalizes & demonstrates proper prosthetic care including modified independent donning to enable safe utilization of prosthesis (All LTGs Target Date: 04/27/2020)    Time  6     Period  Months    Status  New    Target Date  04/27/20      PT LONG TERM GOAL #2   Title  Patient tolerates wear of prosthesis >75% of awake hours without skin or limb pain issues to enable function during his day.    Time  6    Period  Months    Status  New    Target Date  04/27/20      PT LONG TERM GOAL #3   Title  Standing balance with RW support & prosthesis: reaches 5" anteriorly, picks up object from floor, scans environment & manages clothes  modified independent.    Time  6    Period  Months    Status  New    Target Date  04/27/20      PT LONG TERM GOAL #4   Title  Patient ambulates 200' with RW & prosthesis with supervision.    Time  6    Period  Months    Status  New    Target Date  04/27/20      PT LONG TERM GOAL #5   Title  Patient negotiates ramps & curbs with RW & prosthesis with supervision.    Time  6    Period  Months    Status  New    Target Date  04/27/20            Plan - 12/20/19 1235    Clinical Impression Statement  Patient improved sit to/from stand and standing weight shifts with PT instruction. PT worked on prosthetic gait in //bars with instruction in proper  technique.    Personal Factors and Comorbidities  Age;Comorbidity 3+;Fitness;Past/Current Experience;Time since onset of injury/illness/exacerbation;Transportation    Comorbidities  R TFA, CHF, HTN, DM2, neuropathy, PVD, arthritis, gout, visual impairment Rt eye, glaucoma,    Examination-Activity Limitations  Bed Mobility;Locomotion Level;Reach Overhead;Stairs;Stand;Toileting;Transfers    Stability/Clinical Decision Making  Evolving/Moderate complexity    Rehab Potential  Good    PT Frequency  2x / week    PT Duration  Other (comment)   22 weeks   PT Treatment/Interventions  ADLs/Self Care Home Management;DME Instruction;Gait training;Stair training;Functional mobility training;Therapeutic activities;Therapeutic exercise;Balance training;Neuromuscular re-education;Patient/family  education;Prosthetic Training    PT Next Visit Plan  check remaining 2 STGs & set updated STGs, review prosthetic care including donning, work on sit to/from stand including at sink to progress to HEP and standing balance and prosthetic gait in parallel bars., LE stretching/strengthening    Consulted and Agree with Plan of Care  Patient       Patient will benefit from skilled therapeutic intervention in order to improve the following deficits and impairments:  Abnormal gait, Decreased activity tolerance, Decreased balance, Decreased endurance, Decreased knowledge of use of DME, Decreased mobility, Decreased range of motion, Decreased skin integrity, Decreased strength, Impaired flexibility, Postural dysfunction, Prosthetic Dependency  Visit Diagnosis: Other abnormalities of gait and mobility  Unsteadiness on feet  Abnormal posture  Muscle weakness  Stiffness of right hip, not elsewhere classified  Stiffness of left knee, not elsewhere classified  Stiffness of left hip, not elsewhere classified     Problem List Patient Active Problem List   Diagnosis Date Noted  . Post-operative pain 04/21/2019  . Abnormality of gait 03/24/2019  . Hypoglycemia   . Hyperkalemia   . Hyponatremia   . Diabetic peripheral neuropathy ([place])   . Labile blood glucose   . Enteritis due to Clostridium difficile   . Blood glucose abnormal   . Diarrhea   . Benign essential HTN   . Hypoalbuminemia due to protein-calorie malnutrition ([place])   . Labile blood pressure   . 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 Postoperative pain   . Acute blood loss anemia   . Dyslipidemia   . Type 2 diabetes mellitus with peripheral neuropathy ([facility])   . S/P AKA (above knee amputation) ([place]) 11/12/2018  . Unilateral AKA, right ([place])   . Hypertensive crisis   . Diabetes mellitus type 2 in nonobese ([facility])   . Pleural effusion 11/08/2018  . Fever   . Open wound of right foot   . Sepsis ([place]) 11/03/2018  .  Altered mental status   . Acute cystitis without hematuria   . Leukocytosis   . Pressure injury of skin 07/08/2018  . Clostridium difficile colitis 07/07/2018  . Hypokalemia 07/07/2018  . HLD (hyperlipidemia) 07/07/2018  . Acute diverticulitis 06/04/2018  . Cholecystitis 05/24/2018  . Cholecystitis with cholelithiasis 05/21/2018  . Acute systolic heart failure ([place]) 03/27/2018  . Diabetes mellitus without complication ([place]) 19/14/7829  . Hypertension 01/30/2018  . Acute cholecystitis 01/30/2018  . AKI (acute kidney injury) ([place]) 01/30/2018  . Normocytic anemia 01/30/2018  . PVD (peripheral vascular disease) ([place]) 09/05/2014    Jamey Reas PT, DPT 12/20/2019, 12:38 PM  [facility] [address] [place], [place], [address] Phone: [phone]   Fax:  [phone]  Name: Jeremiah [name] MRN: [id number] Date of Birth: [birth year]

## 2019-12-22 ENCOUNTER — Ambulatory Visit (INDEPENDENT_AMBULATORY_CARE_PROVIDER_SITE_OTHER): Payer: Medicare Other | Admitting: Physical Therapy

## 2019-12-22 ENCOUNTER — Other Ambulatory Visit: Payer: Self-pay

## 2019-12-22 ENCOUNTER — Encounter: Payer: Self-pay | Admitting: Physical Therapy

## 2019-12-22 DIAGNOSIS — M25652 Stiffness of left hip, not elsewhere classified: Secondary | ICD-10-CM

## 2019-12-22 DIAGNOSIS — R2689 Other abnormalities of gait and mobility: Secondary | ICD-10-CM

## 2019-12-22 DIAGNOSIS — M25651 Stiffness of right hip, not elsewhere classified: Secondary | ICD-10-CM

## 2019-12-22 DIAGNOSIS — R2681 Unsteadiness on feet: Secondary | ICD-10-CM | POA: Diagnosis not present

## 2019-12-22 DIAGNOSIS — M6281 Muscle weakness (generalized): Secondary | ICD-10-CM | POA: Diagnosis not present

## 2019-12-22 DIAGNOSIS — M25662 Stiffness of left knee, not elsewhere classified: Secondary | ICD-10-CM

## 2019-12-22 DIAGNOSIS — R293 Abnormal posture: Secondary | ICD-10-CM

## 2019-12-22 DIAGNOSIS — M25672 Stiffness of left ankle, not elsewhere classified: Secondary | ICD-10-CM

## 2019-12-22 NOTE — Patient Instructions (Signed)
Standing Up 1. Scoot your bottom to edge of chair. You should not be able to see chair between your legs. 2. Position prosthesis so the heel is touching the floor and prosthesis toes are barely off the ground. 3. Bring left foot close to chair but not under the chair. 4. Place walker so it is ~6 inches in front of your chair, NOT against the chair. 5. Place right hand on walker & left hand on chair to push. 6. When standing up, you need to keep your prosthesis heel touching the ground which will straighten the prosthesis knee.  7. Once the prosthesis knee straightens & ONLY once the knee is straight, reach left hand to walker and straighten your back. 8. Pull prosthesis back under your hips / pelvis.  Sitting Down: 1. Back left leg back so back of leg is touching the chair. 2. Prosthesis toes should be ~2 inches forward to left foot toes. Your prosthesis is out a little.  3. Take weight off prosthesis and bend the prosthesis knee a little bit.  4. Bow your upper body towards front of walker  5. Reach left hand for chair. 6. Sit down

## 2019-12-22 NOTE — Therapy (Signed)
[place], [place], [address] Phone: [phone]   Fax:  [phone]  Physical Therapy Treatment  Patient Details  Name: Jeremiah [name] MRN: [id number] Date of Birth: 07-10-1946 Referring Provider (PT): Clovia Cuff, MD   Encounter Date: 12/22/2019  PT End of Session - 12/22/19 1414    Visit Number  5    Number of Visits  45    Date for PT Re-Evaluation  02/01/20    Authorization Type  [employer] Medicare & BCBS    PT Start Time  1000    PT Stop Time  1105    PT Time Calculation (min)  65 min    Equipment Utilized During Treatment  Gait belt    Activity Tolerance  Patient tolerated treatment well    Behavior During Therapy  [facility] for tasks assessed/performed       Past Medical History:  Diagnosis Date  . Arthritis    "joints; shoulders, knees, hands, back" (05/21/2018)  . C. difficile diarrhea 04/2018  . Diastolic CHF ([place])   . High cholesterol   . History of gout   . Hypertension   . Peripheral vascular disease ([place])   . Pneumonia    "couple times" (05/21/2018)  . Sleep apnea    "has mask; won't use" (05/21/2018)  . Type II diabetes mellitus ([place])     Past Surgical History:  Procedure Laterality Date  . ABDOMINAL AORTOGRAM W/LOWER EXTREMITY N/A 11/01/2018   Procedure: ABDOMINAL AORTOGRAM W/LOWER EXTREMITY;  Surgeon: Lorretta Harp, MD;  Location: [place] [facility];  Service: Cardiovascular;  Laterality: N/A;  . AMPUTATION Right 11/09/2018   Procedure: RIGHT - AMPUTATION ABOVE KNEE;  Surgeon: Waynetta Sandy, MD;  Location: [facility];  Service: Vascular;  Laterality: Right;  . CATARACT EXTRACTION W/ INTRAOCULAR LENS  IMPLANT, BILATERAL Bilateral   . CHOLECYSTECTOMY  05/21/2018   ATTEMPTED LAPAROSCOPIC CHOLECYSTECTOMY, OPEN DRAINAGE OF GALLBLADDER WITH BIOPSY  . CHOLECYSTECTOMY N/A 05/21/2018   Procedure: ATTEMPTED LAPAROSCOPIC CHOLECYSTECTOMY, OPEN DRAINAGE OF GALLBLADDER WITH BIOPSY;  Surgeon: Jovita Kussmaul, MD;  Location: [place];  Service: General;  Laterality: N/A;  . COLONOSCOPY W/ POLYPECTOMY    . COLONOSCOPY WITH PROPOFOL N/A 09/16/2018   Procedure: COLONOSCOPY WITH PROPOFOL;  Surgeon: Wilford Corner, MD;  [facility];  Service: Endoscopy;  Laterality: N/A;  . FECAL TRANSPLANT N/A 09/16/2018   Procedure: FECAL TRANSPLANT;  Surgeon: Wilford Corner, MD;  [facility];  Service: Endoscopy;  Laterality: N/A;  . IR CATHETER TUBE CHANGE  04/14/2018  . IR CHOLANGIOGRAM EXISTING TUBE  03/17/2018  . IR PERC CHOLECYSTOSTOMY  01/31/2018  . IR RADIOLOGIST EVAL & MGMT  03/02/2018    There were no vitals filed for this visit.  Subjective Assessment - 12/22/19 1004    Subjective  He wore liner daily for ~4hrs but only once/day. HE was tired after last PT session but not sore.    Pertinent History  R TFA, CHF, HTN, DM2, neuropathy, PVD, arthritis, gout, visual impairment Rt eye, glaucoma    Limitations  Lifting;Standing;Walking;House hold activities    Patient Stated Goals  To use prosthesis to walk in home & community and drive again.    Currently in Pain?  No/denies    Pain Onset  1 to 4 weeks ago       Patient called his son to see if he could come to future PT appt for training. The son who lives in [place] is not able to get  off work but will ask other son who lives in [place].   PT instructed pt to wear liner 4hrs on, 2hrs off (shrinker when liner off) then liner 4 more hours. Pt verbalized understanding.   PT donned prosthesis with cues on technique with minA during sitting portion.  Standing portion to tighten velcro suspension strap with pt managing upright & PT tightening the strap. Sit to/from stand technique written & video on patient phone. Sit to stand with modA and stand to sit with minA with constant verbal cues.                         PT Education - 12/22/19 1100    Education Details  Sit to/from stand written (see pt  instructions) & video on patient's phone    Person(s) Educated  Patient    Methods  Explanation;Demonstration;Tactile cues;Verbal cues;Handout    Comprehension  Verbalized understanding;Need further instruction       PT Short Term Goals - 12/22/19 2107      PT SHORT TERM GOAL #1   Title  Patient demonstrates ability to donne liner with supervision and donne prosthesis with modA. (All STGs Target Date 12/23/2019)    Baseline  MET 12/20/2019    Time  4    Period  Weeks    Status  Achieved    Target Date  12/23/19      PT SHORT TERM GOAL #2   Title  Patient tolerates wear of liner daily >6 hrs total per day and prosthesis up to 1 hr with family or PT present.    Baseline  Partially MET 12/20/2019 wearing liner >6hrs but not prosthesis as family not around his house much.    Time  4    Period  Weeks    Status  Partially Met    Target Date  12/23/19      PT SHORT TERM GOAL #3   Title  Patient sit to/from stand from elevated w/c to RW with prosthesis with modA.    Baseline  MET 12/22/2019    Time  4    Period  Weeks    Status  Achieved    Target Date  12/23/19      PT SHORT TERM GOAL #4   Title  Patient standing balance with RW support & prosthesis static 1 minute with supervision and scans with cervical motion with contact assist.    Baseline  MET 12/22/2019    Time  4    Period  Weeks    Status  Achieved    Target Date  12/23/19      PT SHORT TERM GOAL #5   Title  Patient ambulates 5' in parallel bars with prosthesis with modA.    Baseline  MET 12/20/2019    Time  4    Period  Weeks    Status  Achieved    Target Date  12/23/19        PT Short Term Goals - 12/22/19 2121      PT SHORT TERM GOAL #1   Title  Patient verbalizes adjusting ply socks to manage how deep limb seats in socket.  (All STGs Target Date: 01/20/2020)    Time  4    Period  Weeks    Status  New    Target Date  01/20/20      PT SHORT TERM GOAL #2   Title  Patient tolerates wear of liner daily >8 hrs  total per day and prosthesis up to 1 hr with family or PT present.    Time  4    Period  Weeks    Status  Revised    Target Date  01/20/20      PT SHORT TERM GOAL #3   Title  Patient sit to/from stand from elevated w/c to RW with prosthesis with supervision.    Time  4    Period  Weeks    Status  Revised    Target Date  01/20/20      PT SHORT TERM GOAL #4   Title  Patient standing balance with RW support & prosthesis static 2 minute with supervision and reaches 2" anteriorly & to knee level with contact assist.    Time  4    Period  Weeks    Status  Revised    Target Date  01/20/20      PT SHORT TERM GOAL #5   Title  Patient ambulates 10' with RW & prosthesis with modA (2 people for safety)    Time  4    Period  Weeks    Status  Revised    Target Date  01/20/20        PT Long Term Goals - 11/03/19 1353      PT LONG TERM GOAL #1   Title  Patient verbalizes & demonstrates proper prosthetic care including modified independent donning to enable safe utilization of prosthesis (All LTGs Target Date: 04/27/2020)    Time  6    Period  Months    Status  New    Target Date  04/27/20      PT LONG TERM GOAL #2   Title  Patient tolerates wear of prosthesis >75% of awake hours without skin or limb pain issues to enable function during his day.    Time  6    Period  Months    Status  New    Target Date  04/27/20      PT LONG TERM GOAL #3   Title  Standing balance with RW support & prosthesis: reaches 5" anteriorly, picks up object from floor, scans environment & manages clothes  modified independent.    Time  6    Period  Months    Status  New    Target Date  04/27/20      PT LONG TERM GOAL #4   Title  Patient ambulates 200' with RW & prosthesis with supervision.    Time  6    Period  Months    Status  New    Target Date  04/27/20      PT LONG TERM GOAL #5   Title  Patient negotiates ramps & curbs with RW & prosthesis with supervision.    Time  6    Period  Months     Status  New    Target Date  04/27/20            Plan - 12/22/19 2108    Clinical Impression Statement  Today's skilled PT session focused on sit to/from stand technique with detailed written directions & video on patient's phone. PT recommended reading directions & watching video multiple times to get steps set in his mind.    Personal Factors and Comorbidities  Age;Comorbidity 3+;Fitness;Past/Current Experience;Time since onset of injury/illness/exacerbation;Transportation    Comorbidities  R TFA, CHF, HTN, DM2, neuropathy, PVD, arthritis, gout, visual impairment Rt eye, glaucoma,    Examination-Activity Limitations  Bed  Mobility;Locomotion Level;Reach Overhead;Stairs;Stand;Toileting;Transfers    Stability/Clinical Decision Making  Evolving/Moderate complexity    Rehab Potential  Good    PT Frequency  2x / week    PT Duration  Other (comment)   22 weeks   PT Treatment/Interventions  ADLs/Self Care Home Management;DME Instruction;Gait training;Stair training;Functional mobility training;Therapeutic activities;Therapeutic exercise;Balance training;Neuromuscular re-education;Patient/family education;Prosthetic Training    PT Next Visit Plan  work towards updated STGs, review prosthetic care including donning, continue work on sit to/from stand including at sink to progress to HEP and standing balance and prosthetic gait in parallel bars., LE stretching/strengthening    Consulted and Agree with Plan of Care  Patient       Patient will benefit from skilled therapeutic intervention in order to improve the following deficits and impairments:  Abnormal gait, Decreased activity tolerance, Decreased balance, Decreased endurance, Decreased knowledge of use of DME, Decreased mobility, Decreased range of motion, Decreased skin integrity, Decreased strength, Impaired flexibility, Postural dysfunction, Prosthetic Dependency  Visit Diagnosis: Other abnormalities of gait and mobility  Unsteadiness on  feet  Abnormal posture  Muscle weakness  Stiffness of right hip, not elsewhere classified  Stiffness of left knee, not elsewhere classified  Stiffness of left hip, not elsewhere classified  Stiffness of left ankle, not elsewhere classified     Problem List Patient Active Problem List   Diagnosis Date Noted  . Post-operative pain 04/21/2019  . Abnormality of gait 03/24/2019  . Hypoglycemia   . Hyperkalemia   . Hyponatremia   . Diabetic peripheral neuropathy ([place])   . Labile blood glucose   . Enteritis due to Clostridium difficile   . Blood glucose abnormal   . Diarrhea   . Benign essential HTN   . Hypoalbuminemia due to protein-calorie malnutrition ([place])   . Labile blood pressure   . 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 Postoperative pain   . Acute blood loss anemia   . Dyslipidemia   . Type 2 diabetes mellitus with peripheral neuropathy ([facility])   . S/P AKA (above knee amputation) ([place]) 11/12/2018  . Unilateral AKA, right ([place])   . Hypertensive crisis   . Diabetes mellitus type 2 in nonobese ([facility])   . Pleural effusion 11/08/2018  . Fever   . Open wound of right foot   . Sepsis ([place]) 11/03/2018  . Altered mental status   . Acute cystitis without hematuria   . Leukocytosis   . Pressure injury of skin 07/08/2018  . Clostridium difficile colitis 07/07/2018  . Hypokalemia 07/07/2018  . HLD (hyperlipidemia) 07/07/2018  . Acute diverticulitis 06/04/2018  . Cholecystitis 05/24/2018  . Cholecystitis with cholelithiasis 05/21/2018  . Acute systolic heart failure ([place]) 03/27/2018  . Diabetes mellitus without complication ([place]) 11/73/5670  . Hypertension 01/30/2018  . Acute cholecystitis 01/30/2018  . AKI (acute kidney injury) ([place]) 01/30/2018  . Normocytic anemia 01/30/2018  . PVD (peripheral vascular disease) ([place]) 09/05/2014    Jamey Reas PT, DPT 12/22/2019, 9:12 PM  [facility] Physical Therapy [address] [place], [place],  [address] Phone: [phone]   Fax:  [phone]  Name: Jeremiah [name] MRN: [id number] Date of Birth: 1946/01/18

## 2019-12-23 ENCOUNTER — Encounter: Payer: Self-pay | Admitting: Physical Therapy

## 2019-12-26 ENCOUNTER — Encounter: Payer: Self-pay | Admitting: Physical Therapy

## 2019-12-27 ENCOUNTER — Encounter: Payer: Self-pay | Admitting: Physical Therapy

## 2019-12-27 ENCOUNTER — Other Ambulatory Visit: Payer: Self-pay

## 2019-12-27 ENCOUNTER — Ambulatory Visit (INDEPENDENT_AMBULATORY_CARE_PROVIDER_SITE_OTHER): Payer: Medicare Other | Admitting: Physical Therapy

## 2019-12-27 DIAGNOSIS — R293 Abnormal posture: Secondary | ICD-10-CM

## 2019-12-27 DIAGNOSIS — R2689 Other abnormalities of gait and mobility: Secondary | ICD-10-CM

## 2019-12-27 DIAGNOSIS — R2681 Unsteadiness on feet: Secondary | ICD-10-CM

## 2019-12-27 DIAGNOSIS — M6281 Muscle weakness (generalized): Secondary | ICD-10-CM

## 2019-12-27 NOTE — Therapy (Signed)
[place], [place], [address] Phone: [phone]   Fax:  [phone]  Physical Therapy Treatment  Patient Details  Name: Jeremiah [name] MRN: [id number] Date of Birth: 1946-05-31 Referring Provider (PT): Clovia Cuff, MD   Encounter Date: 12/27/2019  PT End of Session - 12/27/19 1245    Visit Number  6    Number of Visits  45    Date for PT Re-Evaluation  02/01/20    Authorization Type  [employer]HC Medicare & BCBS    PT Start Time  1006    PT Stop Time  1055    PT Time Calculation (min)  49 min    Equipment Utilized During Treatment  Gait belt    Activity Tolerance  Patient tolerated treatment well    Behavior During Therapy  WFL for tasks assessed/performed       Past Medical History:  Diagnosis Date  . Arthritis    "joints; shoulders, knees, hands, back" (05/21/2018)  . C. difficile diarrhea 04/2018  . Diastolic CHF ([place])   . High cholesterol   . History of gout   . Hypertension   . Peripheral vascular disease ([place])   . Pneumonia    "couple times" (05/21/2018)  . Sleep apnea    "has mask; won't use" (05/21/2018)  . Type II diabetes mellitus ([place])     Past Surgical History:  Procedure Laterality Date  . ABDOMINAL AORTOGRAM W/LOWER EXTREMITY N/A 11/01/2018   Procedure: ABDOMINAL AORTOGRAM W/LOWER EXTREMITY;  Surgeon: Lorretta Harp, MD;  Location: [facility] [facility];  Service: Cardiovascular;  Laterality: N/A;  . AMPUTATION Right 11/09/2018   Procedure: RIGHT - AMPUTATION ABOVE KNEE;  Surgeon: Waynetta Sandy, MD;  Location: [place];  Service: Vascular;  Laterality: Right;  . CATARACT EXTRACTION W/ INTRAOCULAR LENS  IMPLANT, BILATERAL Bilateral   . CHOLECYSTECTOMY  05/21/2018   ATTEMPTED LAPAROSCOPIC CHOLECYSTECTOMY, OPEN DRAINAGE OF GALLBLADDER WITH BIOPSY  . CHOLECYSTECTOMY N/A 05/21/2018   Procedure: ATTEMPTED LAPAROSCOPIC CHOLECYSTECTOMY, OPEN DRAINAGE OF GALLBLADDER WITH BIOPSY;  Surgeon: Jovita Kussmaul, MD;  Location: [place] [facility];  Service: General;  Laterality: N/A;  . COLONOSCOPY W/ POLYPECTOMY    . COLONOSCOPY WITH PROPOFOL N/A 09/16/2018   Procedure: COLONOSCOPY WITH PROPOFOL;  Surgeon: Wilford Corner, MD;  [facility];  Service: Endoscopy;  Laterality: N/A;  . FECAL TRANSPLANT N/A 09/16/2018   Procedure: FECAL TRANSPLANT;  Surgeon: Wilford Corner, MD;  [facility];  Service: Endoscopy;  Laterality: N/A;  . IR CATHETER TUBE CHANGE  04/14/2018  . IR CHOLANGIOGRAM EXISTING TUBE  03/17/2018  . IR PERC CHOLECYSTOSTOMY  01/31/2018  . IR RADIOLOGIST EVAL & MGMT  03/02/2018    There were no vitals filed for this visit.  Subjective Assessment - 12/27/19 1005    Subjective  He read the instructions for sit to/from stand multiple times since last PT appt. He is wearing liner for 4hrs 2x/day.    Pertinent History  R TFA, CHF, HTN, DM2, neuropathy, PVD, arthritis, gout, visual impairment Rt eye, glaucoma    Limitations  Lifting;Standing;Walking;House hold activities    Patient Stated Goals  To use prosthesis to walk in home & community and drive again.    Currently in Pain?  No/denies    Pain Onset  1 to 4 weeks ago                       [facility] Adult PT Treatment/Exercise - 12/27/19 1000  Transfers   Transfers  Sit to Stand;Stand to Sit;Lateral/Scoot Transfers;Supine to Sit;Sit to Supine;Stand Pivot Transfers    Sit to Stand  3: Mod assist;With upper extremity assist;With armrests;From chair/3-in-1;4: Min assist   to RW with PT stabilizing RW, progressed modA to [employer] to Stand Details  Verbal cues for sequencing;Verbal cues for technique;Verbal cues for safe use of DME/AE;Manual facilitation for weight shifting;Visual cues for safe use of DME/AE    Stand to Sit  4: Min assist;With upper extremity assist;With armrests;To chair/3-in-1;To elevated surface;Other (comment);4: Min guard   from RW, PT manually assist prosthetic knee,    Stand to Sit  Details (indicate cue type and reason)  Visual cues for safe use of DME/AE;Verbal cues for safe use of DME/AE;Verbal cues for technique;Manual facilitation for weight shifting    Stand Pivot Transfers  3: Mod assist   RW between w/c & chair w/armrests   Stand Pivot Transfer Details (indicate cue type and reason)  PT demo & verbal cues before task with technique, sequence & wt shifts. Pt return demo to rt & to lt 90* turns and at end of 2 gait cycles to turn 90*to position to sit with constant verbal & manual cues.       Ambulation/Gait   Ambulation/Gait  Yes    Ambulation/Gait Assistance  2: Max assist    Ambulation/Gait Assistance Details  verbal, manual & demo cues on technique including sequence, prosthetic movement & wt shift.  PT manually managing advancement & prosthetic knee control.     Ambulation Distance (Feet)  5 Feet   5' X 2 with 90* turn to position to sit   Assistive device  Prosthesis;Rolling walker    Gait Pattern  Step-to pattern;Decreased step length - left;Decreased stance time - right;Decreased hip/knee flexion - right;Decreased weight shift to right;Right hip hike;Antalgic;Lateral hip instability;Trunk flexed    Ambulation Surface  Indoor;Level      Neuro Re-ed    Neuro Re-ed Details   standing balance reaching with LUE to tighten suspension with modA.        Prosthetics   Prosthetic Care Comments   Reviewed donning prosthesis in sitting with tightening strap as tight as possible. Continue wear of liner only when no family to assist. If son or adult who can assist is available, practice sit to/from stand after they have watched video to understand technique.     Current prosthetic wear tolerance (days/week)   liner only daily    Current prosthetic wear tolerance (#hours/day)   ~8 hrs    Current prosthetic weight-bearing tolerance (hours/day)   No c/o limb pain or LBP with standing for 3 minutes or walking 3'      Edema  none    Residual limb condition   dry skin, gray  coloring from dry skin, normal temperature, conical shape.      Education Provided  Skin check;Residual limb care;Prosthetic cleaning;Correct ply sock adjustment;Proper Donning;Proper wear schedule/adjustment;Other (comment)   see prosthetic care comments   Person(s) Educated  Patient    Education Method  Explanation;Demonstration;Tactile cues;Verbal cues    Education Method  Verbalized understanding;Needs further instruction;Tactile cues required;Verbal cues required    Donning Prosthesis  Moderate assist;Minimal assist;Supervision   ModA standing, MinA prosthesis sitting, sup liner              PT Short Term Goals - 12/27/19 1252      PT SHORT TERM GOAL #1   Title  Patient verbalizes adjusting ply socks  to manage how deep limb seats in socket.  (All STGs Target Date: 01/20/2020)    Time  4    Period  Weeks    Status  On-going    Target Date  01/20/20      PT SHORT TERM GOAL #2   Title  Patient tolerates wear of liner daily >8 hrs total per day and prosthesis up to 1 hr with family or PT present.    Time  4    Period  Weeks    Status  On-going    Target Date  01/20/20      PT SHORT TERM GOAL #3   Title  Patient sit to/from stand from elevated w/c to RW with prosthesis with supervision.    Time  4    Period  Weeks    Status  On-going    Target Date  01/20/20      PT SHORT TERM GOAL #4   Title  Patient standing balance with RW support & prosthesis static 2 minute with supervision and reaches 2" anteriorly & to knee level with contact assist.    Time  4    Period  Weeks    Status  On-going    Target Date  01/20/20      PT SHORT TERM GOAL #5   Title  Patient ambulates 10' with RW & prosthesis with modA (2 people for safety)    Time  4    Period  Weeks    Status  On-going    Target Date  01/20/20        PT Long Term Goals - 12/27/19 1252      PT LONG TERM GOAL #1   Title  Patient verbalizes & demonstrates proper prosthetic care including modified independent  donning to enable safe utilization of prosthesis (All LTGs Target Date: 04/27/2020)    Time  6    Period  Months    Status  On-going    Target Date  04/27/20      PT LONG TERM GOAL #2   Title  Patient tolerates wear of prosthesis >75% of awake hours without skin or limb pain issues to enable function during his day.    Time  6    Period  Months    Status  On-going    Target Date  04/27/20      PT LONG TERM GOAL #3   Title  Standing balance with RW support & prosthesis: reaches 5" anteriorly, picks up object from floor, scans environment & manages clothes  modified independent.    Time  6    Period  Months    Status  On-going    Target Date  04/27/20      PT LONG TERM GOAL #4   Title  Patient ambulates 200' with RW & prosthesis with supervision.    Time  6    Period  Months    Status  On-going    Target Date  04/27/20      PT LONG TERM GOAL #5   Title  Patient negotiates ramps & curbs with RW & prosthesis with supervision.    Time  6    Period  Months    Status  On-going    Target Date  04/27/20            Plan - 12/27/19 1253    Clinical Impression Statement  PT progressed sit to / from stand to stand pivot with RW. He needs assistance  to perform sit/stand and any standing activities. Due to limited assistance outside of PT his progress is slow.    Personal Factors and Comorbidities  Age;Comorbidity 3+;Fitness;Past/Current Experience;Time since onset of injury/illness/exacerbation;Transportation    Comorbidities  R TFA, CHF, HTN, DM2, neuropathy, PVD, arthritis, gout, visual impairment Rt eye, glaucoma,    Examination-Activity Limitations  Bed Mobility;Locomotion Level;Reach Overhead;Stairs;Stand;Toileting;Transfers    Stability/Clinical Decision Making  Evolving/Moderate complexity    Rehab Potential  Good    PT Frequency  2x / week    PT Duration  Other (comment)   22 weeks   PT Treatment/Interventions  ADLs/Self Care Home Management;DME Instruction;Gait  training;Stair training;Functional mobility training;Therapeutic activities;Therapeutic exercise;Balance training;Neuromuscular re-education;Patient/family education;Prosthetic Training    PT Next Visit Plan  work towards updated STGs, review prosthetic care including donning, continue work on sit to/from stand including at sink to progress to HEP and standing balance and prosthetic gait in parallel bars., LE stretching/strengthening    Consulted and Agree with Plan of Care  Patient       Patient will benefit from skilled therapeutic intervention in order to improve the following deficits and impairments:  Abnormal gait, Decreased activity tolerance, Decreased balance, Decreased endurance, Decreased knowledge of use of DME, Decreased mobility, Decreased range of motion, Decreased skin integrity, Decreased strength, Impaired flexibility, Postural dysfunction, Prosthetic Dependency  Visit Diagnosis: Other abnormalities of gait and mobility  Unsteadiness on feet  Abnormal posture  Muscle weakness     Problem List Patient Active Problem List   Diagnosis Date Noted  . Post-operative pain 04/21/2019  . Abnormality of gait 03/24/2019  . Hypoglycemia   . Hyperkalemia   . Hyponatremia   . Diabetic peripheral neuropathy ([facility])   . Labile blood glucose   . Enteritis due to Clostridium difficile   . Blood glucose abnormal   . Diarrhea   . Benign essential HTN   . Hypoalbuminemia due to protein-calorie malnutrition ([facility])   . Labile blood pressure   . 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 Postoperative pain   . Acute blood loss anemia   . Dyslipidemia   . Type 2 diabetes mellitus with peripheral neuropathy ([facility])   . S/P AKA (above knee amputation) ([facility]) 11/12/2018  . Unilateral AKA, right ([facility])   . Hypertensive crisis   . Diabetes mellitus type 2 in nonobese ([facility]CC)   . Pleural effusion 11/08/2018  . Fever   . Open wound of right foot   . Sepsis ([facility]CC) 11/03/2018  . Altered  mental status   . Acute cystitis without hematuria   . Leukocytosis   . Pressure injury of skin 07/08/2018  . Clostridium difficile colitis 07/07/2018  . Hypokalemia 07/07/2018  . HLD (hyperlipidemia) 07/07/2018  . Acute diverticulitis 06/04/2018  . Cholecystitis 05/24/2018  . Cholecystitis with cholelithiasis 05/21/2018  . Acute systolic heart failure (HCC) 03/27/2018  . Diabetes mellitus without complication ([facility]CC) 01/30/2018  . Hypertension 01/30/2018  . Acute cholecystitis 01/30/2018  . AKI (acute kidney injury) ([facility]) 01/30/2018  . Normocytic anemia 01/30/2018  . PVD (peripheral vascular disease) ([facility]) 09/05/2014    Vladimir Faster PT, DPT 12/27/2019, 1:03 PM  [facility] Physical Therapy [address] [place], [place], [address] Phone: [phone]   Fax:  [phone]  Name: CRISTAL HOWATT MRN: [id number] Date of Birth: August 27, 1946

## 2019-12-29 ENCOUNTER — Other Ambulatory Visit: Payer: Self-pay

## 2019-12-29 ENCOUNTER — Ambulatory Visit (INDEPENDENT_AMBULATORY_CARE_PROVIDER_SITE_OTHER): Payer: Medicare Other | Admitting: Physical Therapy

## 2019-12-29 ENCOUNTER — Encounter: Payer: Self-pay | Admitting: Physical Therapy

## 2019-12-29 DIAGNOSIS — R2689 Other abnormalities of gait and mobility: Secondary | ICD-10-CM

## 2019-12-29 DIAGNOSIS — R293 Abnormal posture: Secondary | ICD-10-CM | POA: Diagnosis not present

## 2019-12-29 DIAGNOSIS — R2681 Unsteadiness on feet: Secondary | ICD-10-CM | POA: Diagnosis not present

## 2019-12-29 DIAGNOSIS — M25652 Stiffness of left hip, not elsewhere classified: Secondary | ICD-10-CM

## 2019-12-29 DIAGNOSIS — M6281 Muscle weakness (generalized): Secondary | ICD-10-CM

## 2019-12-29 DIAGNOSIS — M25651 Stiffness of right hip, not elsewhere classified: Secondary | ICD-10-CM

## 2019-12-29 DIAGNOSIS — M25672 Stiffness of left ankle, not elsewhere classified: Secondary | ICD-10-CM

## 2019-12-29 DIAGNOSIS — M25662 Stiffness of left knee, not elsewhere classified: Secondary | ICD-10-CM

## 2019-12-29 NOTE — Therapy (Signed)
[place], [place], [address] Phone: [phone]   Fax:  [phone]  Physical Therapy Treatment  Patient Details  Name: Jeremiah [name] MRN: [id number] Date of Birth: June 27, 1946 Referring Provider (PT): Clovia Cuff, MD   Encounter Date: 12/29/2019  PT End of Session - 12/29/19 1435    Visit Number  7    Number of Visits  45    Date for PT Re-Evaluation  02/01/20    Authorization Type  [employer] Medicare & [employer]BS    PT Start Time  1010    PT Stop Time  1100    PT Time Calculation (min)  50 min    Equipment Utilized During Treatment  Gait belt    Activity Tolerance  Patient tolerated treatment well    Behavior During Therapy  [facility] for tasks assessed/performed       Past Medical History:  Diagnosis Date  . Arthritis    "joints; shoulders, knees, hands, back" (05/21/2018)  . C. difficile diarrhea 04/2018  . Diastolic CHF ([place])   . High cholesterol   . History of gout   . Hypertension   . Peripheral vascular disease ([place])   . Pneumonia    "couple times" (05/21/2018)  . Sleep apnea    "has mask; won't use" (05/21/2018)  . Type II diabetes mellitus ([place])     Past Surgical History:  Procedure Laterality Date  . ABDOMINAL AORTOGRAM W/LOWER EXTREMITY N/A 11/01/2018   Procedure: ABDOMINAL AORTOGRAM W/LOWER EXTREMITY;  Surgeon: Lorretta Harp, MD;  Location: [facility];  Service: Cardiovascular;  Laterality: N/A;  . AMPUTATION Right 11/09/2018   Procedure: RIGHT - AMPUTATION ABOVE KNEE;  Surgeon: Waynetta Sandy, MD;  Location: [facility];  Service: Vascular;  Laterality: Right;  . CATARACT EXTRACTION W/ INTRAOCULAR LENS  IMPLANT, BILATERAL Bilateral   . CHOLECYSTECTOMY  05/21/2018   ATTEMPTED LAPAROSCOPIC CHOLECYSTECTOMY, OPEN DRAINAGE OF GALLBLADDER WITH BIOPSY  . CHOLECYSTECTOMY N/A 05/21/2018   Procedure: ATTEMPTED LAPAROSCOPIC CHOLECYSTECTOMY, OPEN DRAINAGE OF GALLBLADDER WITH BIOPSY;  Surgeon: Jovita Kussmaul, MD;  Location: [place];  Service: General;  Laterality: N/A;  . COLONOSCOPY W/ POLYPECTOMY    . COLONOSCOPY WITH PROPOFOL N/A 09/16/2018   Procedure: COLONOSCOPY WITH PROPOFOL;  Surgeon: Wilford Corner, MD;  Location: [facility];  Service: Endoscopy;  Laterality: N/A;  . FECAL TRANSPLANT N/A 09/16/2018   Procedure: FECAL TRANSPLANT;  Surgeon: Wilford Corner, MD;  Location: [facility];  Service: Endoscopy;  Laterality: N/A;  . IR CATHETER TUBE CHANGE  04/14/2018  . IR CHOLANGIOGRAM EXISTING TUBE  03/17/2018  . IR PERC CHOLECYSTOSTOMY  01/31/2018  . IR RADIOLOGIST EVAL & MGMT  03/02/2018    There were no vitals filed for this visit.  Subjective Assessment - 12/29/19 1010    Subjective  He has not been able to stand as son was not available. He wore liner 4hrs & 2hrs.    Pertinent History  R TFA, CHF, HTN, DM2, neuropathy, PVD, arthritis, gout, visual impairment Rt eye, glaucoma    Limitations  Lifting;Standing;Walking;House hold activities    Patient Stated Goals  To use prosthesis to walk in home & community and drive again.    Currently in Pain?  No/denies    Pain Onset  1 to 4 weeks ago                       [facility] Adult PT Treatment/Exercise - 12/29/19 1010  Transfers   Transfers  Sit to Stand;Stand to Sit;Lateral/Scoot Transfers;Supine to Sit;Sit to Supine;Stand Pivot Transfers    Sit to Stand  3: Mod assist;With upper extremity assist;With armrests;From chair/3-in-1;4: Min assist   to RW with PT stabilizing RW, progressed modA to [employer] to Stand Details  Verbal cues for sequencing;Verbal cues for technique;Verbal cues for safe use of DME/AE;Manual facilitation for weight shifting;Visual cues for safe use of DME/AE    Stand to Sit  4: Min assist;With upper extremity assist;With armrests;To chair/3-in-1;To elevated surface;Other (comment);4: Min guard   from RW, PT manually assist prosthetic knee,    Stand to Sit Details (indicate cue type and reason)   Visual cues for safe use of DME/AE;Verbal cues for safe use of DME/AE;Verbal cues for technique;Manual facilitation for weight shifting    Stand Pivot Transfers  3: Mod assist   RW between w/c & chair w/armrests   Stand Pivot Transfer Details (indicate cue type and reason)  PT demo & verbal cues before task with technique, sequence & wt shifts. Pt return demo to rt & to lt 90* turns and at end of 2 gait cycles to turn 90*to position to sit with constant verbal & manual cues.       Ambulation/Gait   Ambulation/Gait  Yes    Ambulation/Gait Assistance  2: Max assist    Ambulation/Gait Assistance Details  verbal, manual & demo cues on technique including sequence, prosthetic movement & wt shift.  PT manually managing advancement & prosthetic knee control.     Ambulation Distance (Feet)  10 Feet   7' & 10' with 47* turn to position to sit   Assistive device  Prosthesis;Rolling walker    Gait Pattern  Step-to pattern;Decreased step length - left;Decreased stance time - right;Decreased hip/knee flexion - right;Decreased weight shift to right;Right hip hike;Antalgic;Lateral hip instability;Trunk flexed    Ambulation Surface  Indoor;Level      Neuro Re-ed    Neuro Re-ed Details   Sit to/from stand at sink: moving cones (3) with LUE across midline but only 1 with RUE as fatigued.    end of session so fatigued quickly     Prosthetics   Prosthetic Care Comments   Reviewed donning prosthesis in sitting with tightening strap as tight as possible. Pt able to donne sitting slow but no assistance for 80%.  Continue wear of liner only when no family to assist. Can donne prosthesis for sitting only. Need to remove prior to transfers.  Heel of prosthesis needs to be supported: easiest is to scoot forward in chair & put pillow behind him for back support.  If son or adult who can assist is available, practice sit to/from stand after they have watched video to understand technique.     Current prosthetic wear  tolerance (days/week)   liner only daily    Current prosthetic wear tolerance (#hours/day)   ~8 hrs, PT recommended 5 hrs 2x/day and reviewed rationale to building tolerance to most of awake hours.     Current prosthetic weight-bearing tolerance (hours/day)   No c/o limb pain or LBP with standing for 3 minutes or walking 3'      Edema  none    Residual limb condition   dry skin, gray coloring from dry skin, normal temperature, conical shape.      Education Provided  Skin check;Residual limb care;Prosthetic cleaning;Correct ply sock adjustment;Proper Donning;Proper wear schedule/adjustment;Other (comment)   see prosthetic care comments   Person(s) Educated  Patient  Education Method  Explanation;Demonstration;Tactile cues;Verbal cues    Education Method  Verbalized understanding;Needs further instruction;Tactile cues required;Verbal cues required    Donning Prosthesis  Moderate assist;Minimal assist;Supervision   ModA standing portion, MinA prosthesis seated, sup liner              PT Short Term Goals - 12/27/19 1252      PT SHORT TERM GOAL #1   Title  Patient verbalizes adjusting ply socks to manage how deep limb seats in socket.  (All STGs Target Date: 01/20/2020)    Time  4    Period  Weeks    Status  On-going    Target Date  01/20/20      PT SHORT TERM GOAL #2   Title  Patient tolerates wear of liner daily >8 hrs total per day and prosthesis up to 1 hr with family or PT present.    Time  4    Period  Weeks    Status  On-going    Target Date  01/20/20      PT SHORT TERM GOAL #3   Title  Patient sit to/from stand from elevated w/c to RW with prosthesis with supervision.    Time  4    Period  Weeks    Status  On-going    Target Date  01/20/20      PT SHORT TERM GOAL #4   Title  Patient standing balance with RW support & prosthesis static 2 minute with supervision and reaches 2" anteriorly & to knee level with contact assist.    Time  4    Period  Weeks    Status   On-going    Target Date  01/20/20      PT SHORT TERM GOAL #5   Title  Patient ambulates 10' with RW & prosthesis with modA (2 people for safety)    Time  4    Period  Weeks    Status  On-going    Target Date  01/20/20        PT Long Term Goals - 12/27/19 1252      PT LONG TERM GOAL #1   Title  Patient verbalizes & demonstrates proper prosthetic care including modified independent donning to enable safe utilization of prosthesis (All LTGs Target Date: 04/27/2020)    Time  6    Period  Months    Status  On-going    Target Date  04/27/20      PT LONG TERM GOAL #2   Title  Patient tolerates wear of prosthesis >75% of awake hours without skin or limb pain issues to enable function during his day.    Time  6    Period  Months    Status  On-going    Target Date  04/27/20      PT LONG TERM GOAL #3   Title  Standing balance with RW support & prosthesis: reaches 5" anteriorly, picks up object from floor, scans environment & manages clothes  modified independent.    Time  6    Period  Months    Status  On-going    Target Date  04/27/20      PT LONG TERM GOAL #4   Title  Patient ambulates 200' with RW & prosthesis with supervision.    Time  6    Period  Months    Status  On-going    Target Date  04/27/20      PT LONG TERM GOAL #  5   Title  Patient negotiates ramps & curbs with RW & prosthesis with supervision.    Time  6    Period  Months    Status  On-going    Target Date  04/27/20            Plan - 12/29/19 1435    Clinical Impression Statement  Patient improved his ability to assist with sit/stand, stand-pivot and gait with TFA prosthesis but still requires assist for safety. He fatigued with increased activities by end of session.    Personal Factors and Comorbidities  Age;Comorbidity 3+;Fitness;Past/Current Experience;Time since onset of injury/illness/exacerbation;Transportation    Comorbidities  R TFA, CHF, HTN, DM2, neuropathy, PVD, arthritis, gout, visual  impairment Rt eye, glaucoma,    Examination-Activity Limitations  Bed Mobility;Locomotion Level;Reach Overhead;Stairs;Stand;Toileting;Transfers    Stability/Clinical Decision Making  Evolving/Moderate complexity    Rehab Potential  Good    PT Frequency  2x / week    PT Duration  Other (comment)   22 weeks   PT Treatment/Interventions  ADLs/Self Care Home Management;DME Instruction;Gait training;Stair training;Functional mobility training;Therapeutic activities;Therapeutic exercise;Balance training;Neuromuscular re-education;Patient/family education;Prosthetic Training    PT Next Visit Plan  work towards updated STGs, review prosthetic care including donning, continue work on sit to/from stand including at sink to progress to HEP and standing balance and prosthetic gait in parallel bars., LE stretching/strengthening    Consulted and Agree with Plan of Care  Patient       Patient will benefit from skilled therapeutic intervention in order to improve the following deficits and impairments:  Abnormal gait, Decreased activity tolerance, Decreased balance, Decreased endurance, Decreased knowledge of use of DME, Decreased mobility, Decreased range of motion, Decreased skin integrity, Decreased strength, Impaired flexibility, Postural dysfunction, Prosthetic Dependency  Visit Diagnosis: Other abnormalities of gait and mobility  Unsteadiness on feet  Abnormal posture  Muscle weakness  Stiffness of right hip, not elsewhere classified  Stiffness of left knee, not elsewhere classified  Stiffness of left hip, not elsewhere classified  Stiffness of left ankle, not elsewhere classified     Problem List Patient Active Problem List   Diagnosis Date Noted  . Post-operative pain 04/21/2019  . Abnormality of gait 03/24/2019  . Hypoglycemia   . Hyperkalemia   . Hyponatremia   . Diabetic peripheral neuropathy (HCC)   . Labile blood glucose   . Enteritis due to Clostridium difficile   . Blood  glucose abnormal   . Diarrhea   . Benign essential HTN   . Hypoalbuminemia due to protein-calorie malnutrition (HCC)   . Labile blood pressure   . 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 Postoperative pain   . Acute blood loss anemia   . Dyslipidemia   . Type 2 diabetes mellitus with peripheral neuropathy ([facility]CC)   . S/P AKA (above knee amputation) (HCC) 11/12/2018  . Unilateral AKA, right (HCC)   . Hypertensive crisis   . Diabetes mellitus type 2 in nonobese (HCC)   . Pleural effusion 11/08/2018  . Fever   . Open wound of right foot   . Sepsis (HCC) 11/03/2018  . Altered mental status   . Acute cystitis without hematuria   . Leukocytosis   . Pressure injury of skin 07/08/2018  . Clostridium difficile colitis 07/07/2018  . Hypokalemia 07/07/2018  . HLD (hyperlipidemia) 07/07/2018  . Acute diverticulitis 06/04/2018  . Cholecystitis 05/24/2018  . Cholecystitis with cholelithiasis 05/21/2018  . Acute systolic heart failure (HCC) 03/27/2018  . Diabetes mellitus without complication (HCC)  01/30/2018  . Hypertension 01/30/2018  . Acute cholecystitis 01/30/2018  . AKI (acute kidney injury) (HCC) 01/30/2018  . Normocytic anemia 01/30/2018  . PVD (peripheral vascular disease) (HCC) 09/05/2014    Vladimir Faster PT, DPT 12/29/2019, 2:37 PM  [facility] [address] [place], [place], [address] Phone: [phone]   Fax:  [phone]  Name: Jeremiah [name] MRN: [id number] Date of Birth: 1945/12/23

## 2019-12-30 ENCOUNTER — Encounter: Payer: Self-pay | Admitting: Physical Therapy

## 2020-01-02 ENCOUNTER — Encounter: Payer: Self-pay | Admitting: Physical Therapy

## 2020-01-03 ENCOUNTER — Encounter: Payer: Medicare Other | Admitting: Physical Therapy

## 2020-01-05 ENCOUNTER — Other Ambulatory Visit: Payer: Self-pay

## 2020-01-05 ENCOUNTER — Encounter: Payer: Self-pay | Admitting: Physical Therapy

## 2020-01-05 ENCOUNTER — Ambulatory Visit (INDEPENDENT_AMBULATORY_CARE_PROVIDER_SITE_OTHER): Payer: Medicare Other | Admitting: Physical Therapy

## 2020-01-05 DIAGNOSIS — M25652 Stiffness of left hip, not elsewhere classified: Secondary | ICD-10-CM

## 2020-01-05 DIAGNOSIS — R293 Abnormal posture: Secondary | ICD-10-CM | POA: Diagnosis not present

## 2020-01-05 DIAGNOSIS — R2689 Other abnormalities of gait and mobility: Secondary | ICD-10-CM

## 2020-01-05 DIAGNOSIS — M25651 Stiffness of right hip, not elsewhere classified: Secondary | ICD-10-CM

## 2020-01-05 DIAGNOSIS — M6281 Muscle weakness (generalized): Secondary | ICD-10-CM

## 2020-01-05 DIAGNOSIS — R2681 Unsteadiness on feet: Secondary | ICD-10-CM | POA: Diagnosis not present

## 2020-01-05 DIAGNOSIS — M25672 Stiffness of left ankle, not elsewhere classified: Secondary | ICD-10-CM

## 2020-01-05 DIAGNOSIS — M25662 Stiffness of left knee, not elsewhere classified: Secondary | ICD-10-CM

## 2020-01-05 NOTE — Therapy (Signed)
[facility] [place], [place], [address] Phone: [phone]   Fax:  [phone]  Physical Therapy Treatment  Patient Details  Name: Jeremiah [name] MRN: [id number] Date of Birth: January 13, 1946 Referring Provider (PT): Clovia Cuff, MD   Encounter Date: 01/05/2020  PT End of Session - 01/05/20 1317    Visit Number  8    Number of Visits  45    Date for PT Re-Evaluation  02/01/20    Authorization Type  [employer] Medicare & [employer]BS    PT Start Time  1015    PT Stop Time  1100    PT Time Calculation (min)  45 min    Equipment Utilized During Treatment  Gait belt    Activity Tolerance  Patient tolerated treatment well    Behavior During Therapy  [facility] for tasks assessed/performed       Past Medical History:  Diagnosis Date  . Arthritis    "joints; shoulders, knees, hands, back" (05/21/2018)  . C. difficile diarrhea 04/2018  . Diastolic CHF ([place])   . High cholesterol   . History of gout   . Hypertension   . Peripheral vascular disease ([place])   . Pneumonia    "couple times" (05/21/2018)  . Sleep apnea    "has mask; won't use" (05/21/2018)  . Type II diabetes mellitus ([place])     Past Surgical History:  Procedure Laterality Date  . ABDOMINAL AORTOGRAM W/LOWER EXTREMITY N/A 11/01/2018   Procedure: ABDOMINAL AORTOGRAM W/LOWER EXTREMITY;  Surgeon: Lorretta Harp, MD;  Location: [facility];  Service: Cardiovascular;  Laterality: N/A;  . AMPUTATION Right 11/09/2018   Procedure: RIGHT - AMPUTATION ABOVE KNEE;  Surgeon: Waynetta Sandy, MD;  Location: [facility];  Service: Vascular;  Laterality: Right;  . CATARACT EXTRACTION W/ INTRAOCULAR LENS  IMPLANT, BILATERAL Bilateral   . CHOLECYSTECTOMY  05/21/2018   ATTEMPTED LAPAROSCOPIC CHOLECYSTECTOMY, OPEN DRAINAGE OF GALLBLADDER WITH BIOPSY  . CHOLECYSTECTOMY N/A 05/21/2018   Procedure: ATTEMPTED LAPAROSCOPIC CHOLECYSTECTOMY, OPEN DRAINAGE OF GALLBLADDER WITH BIOPSY;  Surgeon: Jovita Kussmaul, MD;  Location: [place];  Service: General;  Laterality: N/A;  . COLONOSCOPY W/ POLYPECTOMY    . COLONOSCOPY WITH PROPOFOL N/A 09/16/2018   Procedure: COLONOSCOPY WITH PROPOFOL;  Surgeon: Wilford Corner, MD;  Location: [facility];  Service: Endoscopy;  Laterality: N/A;  . FECAL TRANSPLANT N/A 09/16/2018   Procedure: FECAL TRANSPLANT;  Surgeon: Wilford Corner, MD;  Location: [facility];  Service: Endoscopy;  Laterality: N/A;  . IR CATHETER TUBE CHANGE  04/14/2018  . IR CHOLANGIOGRAM EXISTING TUBE  03/17/2018  . IR PERC CHOLECYSTOSTOMY  01/31/2018  . IR RADIOLOGIST EVAL & MGMT  03/02/2018    There were no vitals filed for this visit.  Subjective Assessment - 01/05/20 1011    Subjective  He is wearing liner 4hrs 2x/day as PT advised.    Pertinent History  R TFA, CHF, HTN, DM2, neuropathy, PVD, arthritis, gout, visual impairment Rt eye, glaucoma    Limitations  Lifting;Standing;Walking;House hold activities    Patient Stated Goals  To use prosthesis to walk in home & community and drive again.    Currently in Pain?  No/denies    Pain Onset  1 to 4 weeks ago                       [facility] Adult PT Treatment/Exercise - 01/05/20 1015      Transfers   Transfers  Sit to Stand;Stand to  Sit;Lateral/Scoot Transfers;Supine to Sit;Sit to Supine;Stand Pivot Transfers    Sit to Stand  With upper extremity assist;With armrests;From chair/3-in-1;4: Min assist   to RW with PT stabilizing RW, progressed modA to [employer] to Stand Details  Verbal cues for sequencing;Verbal cues for technique;Verbal cues for safe use of DME/AE;Manual facilitation for weight shifting;Visual cues for safe use of DME/AE    Stand to Sit  4: Min assist;With upper extremity assist;With armrests;To chair/3-in-1;To elevated surface;Other (comment);4: Min guard   from RW, PT manually assist prosthetic knee,    Stand to Sit Details (indicate cue type and reason)  Visual cues for safe use of DME/AE;Verbal cues  for safe use of DME/AE;Verbal cues for technique;Manual facilitation for weight shifting    Stand Pivot Transfers  3: Mod assist   RW between w/c & chair w/armrests   Stand Pivot Transfer Details (indicate cue type and reason)  Pt requiring manual assist to stabilize prosthetic knee & 50% verbal cues during task for sequence & prosthesis control.       Ambulation/Gait   Ambulation/Gait  Yes    Ambulation/Gait Assistance  2: Max assist    Ambulation/Gait Assistance Details  Manual assist to extend prosthetic knee at initial contact & maintain during stance, verbal & tactile cues on sequence, step length, wt shift & prothesis advancement.     Ambulation Distance (Feet)  20 Feet   20' with 28* turn to position to sit   Assistive device  Prosthesis;Rolling walker    Gait Pattern  Step-to pattern;Decreased step length - left;Decreased stance time - right;Decreased hip/knee flexion - right;Decreased weight shift to right;Right hip hike;Antalgic;Lateral hip instability;Trunk flexed    Ambulation Surface  Indoor;Level      Neuro Re-ed    Neuro Re-ed Details   --      Prosthetics   Prosthetic Care Comments   Reviewed donning prosthesis in sitting with tightening strap as tight as possible. Pt able to donne sitting slow but no assistance for 80%.  Continue wear of liner only when no family to assist. Can donne prosthesis for sitting only. Need to remove prior to transfers.  Heel of prosthesis needs to be supported: easiest is to scoot forward in chair & put pillow behind him for back support.  If son or adult who can assist is available, practice sit to/from stand after they have watched video to understand technique.    PT explained rational for locked knee at this time.     Current prosthetic wear tolerance (days/week)   liner only daily    Current prosthetic wear tolerance (#hours/day)   5 hrs 2x/day and reviewed rationale to building tolerance to most of awake hours.     Current prosthetic  weight-bearing tolerance (hours/day)   No c/o limb pain or LBP with standing for 3 minutes or walking 3'      Edema  none    Residual limb condition   dry skin, gray coloring from dry skin, normal temperature, conical shape.      Education Provided  Skin check;Residual limb care;Prosthetic cleaning;Correct ply sock adjustment;Proper Donning;Proper wear schedule/adjustment;Other (comment)   see prosthetic care comments   Person(s) Educated  Patient    Education Method  Explanation;Demonstration;Tactile cues;Verbal cues    Education Method  Verbalized understanding;Returned demonstration;Tactile cues required;Verbal cues required;Needs further instruction    Donning Prosthesis  Moderate assist;Minimal assist;Supervision   ModA standing portion, MinA/sup sitting portion  PT Short Term Goals - 12/27/19 1252      PT SHORT TERM GOAL #1   Title  Patient verbalizes adjusting ply socks to manage how deep limb seats in socket.  (All STGs Target Date: 01/20/2020)    Time  4    Period  Weeks    Status  On-going    Target Date  01/20/20      PT SHORT TERM GOAL #2   Title  Patient tolerates wear of liner daily >8 hrs total per day and prosthesis up to 1 hr with family or PT present.    Time  4    Period  Weeks    Status  On-going    Target Date  01/20/20      PT SHORT TERM GOAL #3   Title  Patient sit to/from stand from elevated w/c to RW with prosthesis with supervision.    Time  4    Period  Weeks    Status  On-going    Target Date  01/20/20      PT SHORT TERM GOAL #4   Title  Patient standing balance with RW support & prosthesis static 2 minute with supervision and reaches 2" anteriorly & to knee level with contact assist.    Time  4    Period  Weeks    Status  On-going    Target Date  01/20/20      PT SHORT TERM GOAL #5   Title  Patient ambulates 10' with RW & prosthesis with modA (2 people for safety)    Time  4    Period  Weeks    Status  On-going    Target  Date  01/20/20        PT Long Term Goals - 12/27/19 1252      PT LONG TERM GOAL #1   Title  Patient verbalizes & demonstrates proper prosthetic care including modified independent donning to enable safe utilization of prosthesis (All LTGs Target Date: 04/27/2020)    Time  6    Period  Months    Status  On-going    Target Date  04/27/20      PT LONG TERM GOAL #2   Title  Patient tolerates wear of prosthesis >75% of awake hours without skin or limb pain issues to enable function during his day.    Time  6    Period  Months    Status  On-going    Target Date  04/27/20      PT LONG TERM GOAL #3   Title  Standing balance with RW support & prosthesis: reaches 5" anteriorly, picks up object from floor, scans environment & manages clothes  modified independent.    Time  6    Period  Months    Status  On-going    Target Date  04/27/20      PT LONG TERM GOAL #4   Title  Patient ambulates 200' with RW & prosthesis with supervision.    Time  6    Period  Months    Status  On-going    Target Date  04/27/20      PT LONG TERM GOAL #5   Title  Patient negotiates ramps & curbs with RW & prosthesis with supervision.    Time  6    Period  Months    Status  On-going    Target Date  04/27/20  Plan - 01/05/20 2229    Clinical Impression Statement  Patient would benefit from switching knee to manual lock to increase stability. He has limited to no ability to practice outside of PT due to living with disabled wife and sons unable to come over or assist more.    Personal Factors and Comorbidities  Age;Comorbidity 3+;Fitness;Past/Current Experience;Time since onset of injury/illness/exacerbation;Transportation    Comorbidities  R TFA, CHF, HTN, DM2, neuropathy, PVD, arthritis, gout, visual impairment Rt eye, glaucoma,    Examination-Activity Limitations  Bed Mobility;Locomotion Level;Reach Overhead;Stairs;Stand;Toileting;Transfers    Stability/Clinical Decision Making   Evolving/Moderate complexity    Rehab Potential  Good    PT Frequency  2x / week    PT Duration  Other (comment)   22 weeks   PT Treatment/Interventions  ADLs/Self Care Home Management;DME Instruction;Gait training;Stair training;Functional mobility training;Therapeutic activities;Therapeutic exercise;Balance training;Neuromuscular re-education;Patient/family education;Prosthetic Training    PT Next Visit Plan  prosthetist is switching knee to manual lock. PT to instruct in sit/stand & use of manual lock.  work towards updated STGs, review prosthetic care including donning, continue work on sit to/from stand including at sink to progress to [employer] and standing balance and prosthetic gait in parallel bars., LE stretching/strengthening    Consulted and Agree with Plan of Care  Patient       Patient will benefit from skilled therapeutic intervention in order to improve the following deficits and impairments:  Abnormal gait, Decreased activity tolerance, Decreased balance, Decreased endurance, Decreased knowledge of use of DME, Decreased mobility, Decreased range of motion, Decreased skin integrity, Decreased strength, Impaired flexibility, Postural dysfunction, Prosthetic Dependency  Visit Diagnosis: Other abnormalities of gait and mobility  Unsteadiness on feet  Abnormal posture  Muscle weakness  Stiffness of right hip, not elsewhere classified  Stiffness of left knee, not elsewhere classified  Stiffness of left hip, not elsewhere classified  Stiffness of left ankle, not elsewhere classified     Problem List Patient Active Problem List   Diagnosis Date Noted  . Post-operative pain 04/21/2019  . Abnormality of gait 03/24/2019  . Hypoglycemia   . Hyperkalemia   . Hyponatremia   . Diabetic peripheral neuropathy ([facility])   . Labile blood glucose   . Enteritis due to Clostridium difficile   . Blood glucose abnormal   . Diarrhea   . Benign essential HTN   . Hypoalbuminemia due to  protein-calorie malnutrition ([facility])   . Labile blood pressure   . 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 Postoperative pain   . Acute blood loss anemia   . Dyslipidemia   . Type 2 diabetes mellitus with peripheral neuropathy ([facility])   . S/P AKA (above knee amputation) ([facility]) 11/12/2018  . Unilateral AKA, right ([facility])   . Hypertensive crisis   . Diabetes mellitus type 2 in nonobese ([facility])   . Pleural effusion 11/08/2018  . Fever   . Open wound of right foot   . Sepsis ([facility]) 11/03/2018  . Altered mental status   . Acute cystitis without hematuria   . Leukocytosis   . Pressure injury of skin 07/08/2018  . Clostridium difficile colitis 07/07/2018  . Hypokalemia 07/07/2018  . HLD (hyperlipidemia) 07/07/2018  . Acute diverticulitis 06/04/2018  . Cholecystitis 05/24/2018  . Cholecystitis with cholelithiasis 05/21/2018  . Acute systolic heart failure (HCC) 03/27/2018  . Diabetes mellitus without complication (HCC) 01/30/2018  . Hypertension 01/30/2018  . Acute cholecystitis 01/30/2018  . AKI (acute kidney injury) ([facility]CC) 01/30/2018  . Normocytic anemia 01/30/2018  .  PVD (peripheral vascular disease) (HCC) 09/05/2014    Vladimir Faster, PT, DPT 01/05/2020, 10:32 PM  [facility] Physical Therapy [address] [place], [place], [address] Phone: [phone]   Fax:  [phone]  Name: CORKY LEDEZMA MRN: [id number] Date of Birth: November 08, 1946

## 2020-01-06 ENCOUNTER — Encounter: Payer: Self-pay | Admitting: Physical Therapy

## 2020-01-09 ENCOUNTER — Encounter: Payer: Self-pay | Admitting: Physical Therapy

## 2020-01-10 ENCOUNTER — Other Ambulatory Visit: Payer: Self-pay

## 2020-01-10 ENCOUNTER — Encounter: Payer: Self-pay | Admitting: Physical Therapy

## 2020-01-10 ENCOUNTER — Ambulatory Visit (INDEPENDENT_AMBULATORY_CARE_PROVIDER_SITE_OTHER): Payer: Medicare Other | Admitting: Physical Therapy

## 2020-01-10 DIAGNOSIS — M25651 Stiffness of right hip, not elsewhere classified: Secondary | ICD-10-CM

## 2020-01-10 DIAGNOSIS — R2689 Other abnormalities of gait and mobility: Secondary | ICD-10-CM

## 2020-01-10 DIAGNOSIS — M25652 Stiffness of left hip, not elsewhere classified: Secondary | ICD-10-CM

## 2020-01-10 DIAGNOSIS — R2681 Unsteadiness on feet: Secondary | ICD-10-CM

## 2020-01-10 DIAGNOSIS — R293 Abnormal posture: Secondary | ICD-10-CM

## 2020-01-10 DIAGNOSIS — M6281 Muscle weakness (generalized): Secondary | ICD-10-CM

## 2020-01-10 DIAGNOSIS — M25662 Stiffness of left knee, not elsewhere classified: Secondary | ICD-10-CM

## 2020-01-10 DIAGNOSIS — M25672 Stiffness of left ankle, not elsewhere classified: Secondary | ICD-10-CM

## 2020-01-10 NOTE — Therapy (Signed)
[facility] [address] [place], [place], [address] Phone: [phone]   Fax:  [phone]  Physical Therapy Treatment  Patient Details  Name: Jeremiah [name] MRN: [id number] Date of Birth: Apr 03, 1946 Referring Provider (PT): Annita Brod, MD   Encounter Date: 01/10/2020  PT End of Session - 01/10/20 1159    Visit Number  9    Number of Visits  45    Date for PT Re-Evaluation  02/01/20    Authorization Type  [employer] Medicare & [employer]BS    PT Start Time  1008    PT Stop Time  1101    PT Time Calculation (min)  53 min    Equipment Utilized During Treatment  Gait belt    Activity Tolerance  Patient tolerated treatment well    Behavior During Therapy  [facility] for tasks assessed/performed       Past Medical History:  Diagnosis Date  . Arthritis    "joints; shoulders, knees, hands, back" (05/21/2018)  . C. difficile diarrhea 04/2018  . Diastolic CHF (HCC)   . High cholesterol   . History of gout   . Hypertension   . Peripheral vascular disease (HCC)   . Pneumonia    "couple times" (05/21/2018)  . Sleep apnea    "has mask; won't use" (05/21/2018)  . Type II diabetes mellitus (HCC)     Past Surgical History:  Procedure Laterality Date  . ABDOMINAL AORTOGRAM W/LOWER EXTREMITY N/A 11/01/2018   Procedure: ABDOMINAL AORTOGRAM W/LOWER EXTREMITY;  Surgeon: Runell Gess, MD;  Location: [facility];  Service: Cardiovascular;  Laterality: N/A;  . AMPUTATION Right 11/09/2018   Procedure: RIGHT - AMPUTATION ABOVE KNEE;  Surgeon: Maeola Harman, MD;  [facility];  Service: Vascular;  Laterality: Right;  . CATARACT EXTRACTION W/ INTRAOCULAR LENS  IMPLANT, BILATERAL Bilateral   . CHOLECYSTECTOMY  05/21/2018   ATTEMPTED LAPAROSCOPIC CHOLECYSTECTOMY, OPEN DRAINAGE OF GALLBLADDER WITH BIOPSY  . CHOLECYSTECTOMY N/A 05/21/2018   Procedure: ATTEMPTED LAPAROSCOPIC CHOLECYSTECTOMY, OPEN DRAINAGE OF GALLBLADDER WITH BIOPSY;  Surgeon: Griselda Miner, MD;  Location: [facility] OR;  Service: General;  Laterality: N/A;  . COLONOSCOPY W/ POLYPECTOMY    . COLONOSCOPY WITH PROPOFOL N/A 09/16/2018   Procedure: COLONOSCOPY WITH PROPOFOL;  Surgeon: Charlott Rakes, MD;  Location: [facility];  Service: Endoscopy;  Laterality: N/A;  . 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 IR CATHETER TUBE CHANGE  04/14/2018  . IR CHOLANGIOGRAM EXISTING TUBE  03/17/2018  . IR PERC CHOLECYSTOSTOMY  01/31/2018  . IR RADIOLOGIST EVAL & MGMT  03/02/2018    There were no vitals filed for this visit.  Subjective Assessment - 01/10/20 1005    Subjective  PT had prosthetist add manual lock to prosthetic knee. He has been wearing the liner 4-5hrs 2x/day.    Pertinent History  R TFA, CHF, HTN, DM2, neuropathy, PVD, arthritis, gout, visual impairment Rt eye, glaucoma    Limitations  Lifting;Standing;Walking;House hold activities    Patient Stated Goals  To use prosthesis to walk in home & community and drive again.    Currently in Pain?  No/denies    Pain Onset  1 to 4 weeks ago                       [facility] Adult PT Treatment/Exercise - 01/10/20 1010      Transfers  Transfers  Sit to Stand;Stand to Sit;Lateral/Scoot Transfers;Supine to Sit;Sit to Supine;Stand Pivot Transfers    Sit to Stand  4: Min assist;With upper extremity assist;With armrests;From chair/3-in-1   to RW with PT stabilizing RW w/prosthetic knee locking    Sit to Stand Details  Verbal cues for sequencing;Verbal cues for technique;Verbal cues for safe use of DME/AE;Manual facilitation for weight shifting;Visual cues for safe use of DME/AE    Sit to Stand Details (indicate cue type and reason)  tactile, verbal and demo cues on putting lock mechanism in position "cocking" so knee locks upon extension during standing process and cues on pushing forward prior to straighten  back.     Stand to Sit  4: Min assist;With upper extremity assist;With armrests;To chair/3-in-1;To elevated surface;Other (comment);4: Min guard   from RW, prosthetic knee locked and unlocked once seated.    Stand to Sit Details (indicate cue type and reason)  Visual cues for safe use of DME/AE;Verbal cues for safe use of DME/AE;Verbal cues for technique;Manual facilitation for weight shifting    Stand to Sit Details  verbal, tactile & demo cues on technique with prosthetic knee locked.     Stand Pivot Transfers  3: Mod assist   RW between w/c & chair w/armrests   Stand Pivot Transfer Details (indicate cue type and reason)  RW & locked TFA prosthesis, verbal, manual/tactile cues on technique      Ambulation/Gait   Ambulation/Gait  Yes    Ambulation/Gait Assistance  2: Max assist    Ambulation/Gait Assistance Details  Prosthetic knee locked, verbal & tactile cues on sequence, wt shift, RW movement.  Pt needs increased assistance when stepping back towards chair.     Ambulation Distance (Feet)  10 Feet   10' X 3 with 90* turn   Assistive device  Prosthesis;Rolling walker   locked prosthetic knee   Gait Pattern  Step-to pattern;Decreased step length - left;Decreased stance time - right;Decreased hip/knee flexion - right;Decreased weight shift to right;Right hip hike;Antalgic;Lateral hip instability;Trunk flexed    Ambulation Surface  Indoor;Level      Prosthetics   Prosthetic Care Comments   PT demo & verbal cues on manually locking & unlocking prosthetic knee. PT videotaped on his phone PT locking & unlocking knee and recommendation to practice while prosthesis in his lap.     Current prosthetic wear tolerance (days/week)   liner only daily    Current prosthetic wear tolerance (#hours/day)   5 hrs 2x/day, PT recommended donning after morning bath, removing 2hrs midday, then wear until bedtime.     Current prosthetic weight-bearing tolerance (hours/day)   No c/o limb pain or LBP with standing  for 3 minutes or walking 3'      Edema  none    Residual limb condition   dry skin, gray coloring from dry skin, normal temperature, conical shape.      Education Provided  Skin check;Residual limb care;Prosthetic cleaning;Correct ply sock adjustment;Proper Donning;Proper wear schedule/adjustment;Other (comment)   see prosthetic care comments   Person(s) Educated  Patient    Education Method  Explanation;Demonstration;Tactile cues;Verbal cues;Other (comment)   video on pt phone   Education Method  Verbalized understanding;Needs further instruction    Donning Prosthesis  Supervision;Minimal assist;Moderate assist               PT Short Term Goals - 12/27/19 1252      PT SHORT TERM GOAL #1   Title  Patient verbalizes adjusting ply socks to manage how  deep limb seats in socket.  (All STGs Target Date: 01/20/2020)    Time  4    Period  Weeks    Status  On-going    Target Date  01/20/20      PT SHORT TERM GOAL #2   Title  Patient tolerates wear of liner daily >8 hrs total per day and prosthesis up to 1 hr with family or PT present.    Time  4    Period  Weeks    Status  On-going    Target Date  01/20/20      PT SHORT TERM GOAL #3   Title  Patient sit to/from stand from elevated w/c to RW with prosthesis with supervision.    Time  4    Period  Weeks    Status  On-going    Target Date  01/20/20      PT SHORT TERM GOAL #4   Title  Patient standing balance with RW support & prosthesis static 2 minute with supervision and reaches 2" anteriorly & to knee level with contact assist.    Time  4    Period  Weeks    Status  On-going    Target Date  01/20/20      PT SHORT TERM GOAL #5   Title  Patient ambulates 10' with RW & prosthesis with modA (2 people for safety)    Time  4    Period  Weeks    Status  On-going    Target Date  01/20/20        PT Long Term Goals - 12/27/19 1252      PT LONG TERM GOAL #1   Title  Patient verbalizes & demonstrates proper prosthetic care  including modified independent donning to enable safe utilization of prosthesis (All LTGs Target Date: 04/27/2020)    Time  6    Period  Months    Status  On-going    Target Date  04/27/20      PT LONG TERM GOAL #2   Title  Patient tolerates wear of prosthesis >75% of awake hours without skin or limb pain issues to enable function during his day.    Time  6    Period  Months    Status  On-going    Target Date  04/27/20      PT LONG TERM GOAL #3   Title  Standing balance with RW support & prosthesis: reaches 5" anteriorly, picks up object from floor, scans environment & manages clothes  modified independent.    Time  6    Period  Months    Status  On-going    Target Date  04/27/20      PT LONG TERM GOAL #4   Title  Patient ambulates 200' with RW & prosthesis with supervision.    Time  6    Period  Months    Status  On-going    Target Date  04/27/20      PT LONG TERM GOAL #5   Title  Patient negotiates ramps & curbs with RW & prosthesis with supervision.    Time  6    Period  Months    Status  On-going    Target Date  04/27/20            Plan - 01/10/20 1645    Clinical Impression Statement  Manual lock on prosthetic knee improved safety. Patient requires multiple repetitions over several sessions to learn new task  like locked knee. He needs more practice to improve his ability to utilize it.    Personal Factors and Comorbidities  Age;Comorbidity 3+;Fitness;Past/Current Experience;Time since onset of injury/illness/exacerbation;Transportation    Comorbidities  R TFA, CHF, HTN, DM2, neuropathy, PVD, arthritis, gout, visual impairment Rt eye, glaucoma,    Examination-Activity Limitations  Bed Mobility;Locomotion Level;Reach Overhead;Stairs;Stand;Toileting;Transfers    Stability/Clinical Decision Making  Evolving/Moderate complexity    Rehab Potential  Good    PT Frequency  2x / week    PT Duration  Other (comment)   22 weeks   PT Treatment/Interventions  ADLs/Self Care  Home Management;DME Instruction;Gait training;Stair training;Functional mobility training;Therapeutic activities;Therapeutic exercise;Balance training;Neuromuscular re-education;Patient/family education;Prosthetic Training    PT Next Visit Plan  10th visit note, sit/stand & use of manual lock.  work towards updated STGs, review prosthetic care including donning, continue work on sit to/from stand including at sink to progress to [employer] and standing balance and prosthetic gait in parallel bars., LE stretching/strengthening    Consulted and Agree with Plan of Care  Patient       Patient will benefit from skilled therapeutic intervention in order to improve the following deficits and impairments:  Abnormal gait, Decreased activity tolerance, Decreased balance, Decreased endurance, Decreased knowledge of use of DME, Decreased mobility, Decreased range of motion, Decreased skin integrity, Decreased strength, Impaired flexibility, Postural dysfunction, Prosthetic Dependency  Visit Diagnosis: Other abnormalities of gait and mobility  Unsteadiness on feet  Abnormal posture  Muscle weakness  Stiffness of right hip, not elsewhere classified  Stiffness of left knee, not elsewhere classified  Stiffness of left hip, not elsewhere classified  Stiffness of left ankle, not elsewhere classified     Problem List Patient Active Problem List   Diagnosis Date Noted  . Post-operative pain 04/21/2019  . Abnormality of gait 03/24/2019  . Hypoglycemia   . Hyperkalemia   . Hyponatremia   . Diabetic peripheral neuropathy (HCC)   . Labile blood glucose   . Enteritis due to Clostridium difficile   . Blood glucose abnormal   . Diarrhea   . Benign essential HTN   . Hypoalbuminemia due to protein-calorie malnutrition (HCC)   . Labile blood pressure   . 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 Postoperative pain   . Acute blood loss anemia   . Dyslipidemia   . Type 2 diabetes mellitus with peripheral  neuropathy (HCC)   . S/P AKA (above knee amputation) (HCC) 11/12/2018  . Unilateral AKA, right (HCC)   . Hypertensive crisis   . Diabetes mellitus type 2 in nonobese (HCC)   . Pleural effusion 11/08/2018  . Fever   . Open wound of right foot   . Sepsis (HCC) 11/03/2018  . Altered mental status   . Acute cystitis without hematuria   . Leukocytosis   . Pressure injury of skin 07/08/2018  . Clostridium difficile colitis 07/07/2018  . Hypokalemia 07/07/2018  . HLD (hyperlipidemia) 07/07/2018  . Acute diverticulitis 06/04/2018  . Cholecystitis 05/24/2018  . Cholecystitis with cholelithiasis 05/21/2018  . Acute systolic heart failure (HCC) 03/27/2018  . Diabetes mellitus without complication (HCC) 01/30/2018  . Hypertension 01/30/2018  . Acute cholecystitis 01/30/2018  . AKI (acute kidney injury) (HCC) 01/30/2018  . Normocytic anemia 01/30/2018  . PVD (peripheral vascular disease) (HCC) 09/05/2014    Vladimir Faster PT, DPT 01/10/2020, 4:48 PM  [facility] [address] [place], [place], [address] Phone: [phone]   Fax:  [phone]  Name: JOHNPAUL GILLENTINE MRN: [id number] Date  of Birth: 1946-04-17

## 2020-01-12 ENCOUNTER — Encounter: Payer: Medicare Other | Admitting: Physical Therapy

## 2020-01-13 ENCOUNTER — Encounter: Payer: Self-pay | Admitting: Physical Therapy

## 2020-01-16 ENCOUNTER — Encounter: Payer: Self-pay | Admitting: Physical Therapy

## 2020-01-17 ENCOUNTER — Ambulatory Visit (INDEPENDENT_AMBULATORY_CARE_PROVIDER_SITE_OTHER): Payer: Medicare Other | Admitting: Physical Therapy

## 2020-01-17 ENCOUNTER — Other Ambulatory Visit: Payer: Self-pay

## 2020-01-17 ENCOUNTER — Encounter: Payer: Self-pay | Admitting: Physical Therapy

## 2020-01-17 DIAGNOSIS — R2681 Unsteadiness on feet: Secondary | ICD-10-CM | POA: Diagnosis not present

## 2020-01-17 DIAGNOSIS — R293 Abnormal posture: Secondary | ICD-10-CM

## 2020-01-17 DIAGNOSIS — M25651 Stiffness of right hip, not elsewhere classified: Secondary | ICD-10-CM

## 2020-01-17 DIAGNOSIS — R2689 Other abnormalities of gait and mobility: Secondary | ICD-10-CM

## 2020-01-17 DIAGNOSIS — M6281 Muscle weakness (generalized): Secondary | ICD-10-CM

## 2020-01-17 DIAGNOSIS — M25652 Stiffness of left hip, not elsewhere classified: Secondary | ICD-10-CM

## 2020-01-17 DIAGNOSIS — M25672 Stiffness of left ankle, not elsewhere classified: Secondary | ICD-10-CM

## 2020-01-17 DIAGNOSIS — M25662 Stiffness of left knee, not elsewhere classified: Secondary | ICD-10-CM

## 2020-01-17 NOTE — Therapy (Signed)
[place], [place], [address] Phone: [phone]   Fax:  [phone]  Physical Therapy Treatment & 10th Visit Note  Patient Details  Name: Jeremiah [name] MRN: [id number] Date of Birth: 1946/05/26 Referring Provider (PT): Clovia Cuff, MD   Encounter Date: 01/17/2020   Progress Note Reporting Period 11/02/2020 to 01/17/2020  See note below for Objective Data and Assessment of Progress/Goals.       PT End of Session - 01/17/20 1111    Visit Number  10    Number of Visits  45    Date for PT Re-Evaluation  02/01/20    Authorization Type  [employer] Medicare & [employer]BS    PT Start Time  1006    PT Stop Time  1100    PT Time Calculation (min)  54 min    Equipment Utilized During Treatment  Gait belt    Activity Tolerance  Patient tolerated treatment well    Behavior During Therapy  WFL for tasks assessed/performed       Past Medical History:  Diagnosis Date  . Arthritis    "joints; shoulders, knees, hands, back" (05/21/2018)  . C. difficile diarrhea 04/2018  . Diastolic CHF ([place])   . High cholesterol   . History of gout   . Hypertension   . Peripheral vascular disease ([place])   . Pneumonia    "couple times" (05/21/2018)  . Sleep apnea    "has mask; won't use" (05/21/2018)  . Type II diabetes mellitus ([place])     Past Surgical History:  Procedure Laterality Date  . ABDOMINAL AORTOGRAM W/LOWER EXTREMITY N/A 11/01/2018   Procedure: ABDOMINAL AORTOGRAM W/LOWER EXTREMITY;  Surgeon: Lorretta Harp, MD;  Location: [facility];  Service: Cardiovascular;  Laterality: N/A;  . AMPUTATION Right 11/09/2018   Procedure: RIGHT - AMPUTATION ABOVE KNEE;  Surgeon: Waynetta Sandy, MD;  Location: [facility];  Service: Vascular;  Laterality: Right;  . CATARACT EXTRACTION W/ INTRAOCULAR LENS  IMPLANT, BILATERAL Bilateral   . CHOLECYSTECTOMY  05/21/2018   ATTEMPTED LAPAROSCOPIC CHOLECYSTECTOMY, OPEN DRAINAGE OF GALLBLADDER WITH  BIOPSY  . CHOLECYSTECTOMY N/A 05/21/2018   Procedure: ATTEMPTED LAPAROSCOPIC CHOLECYSTECTOMY, OPEN DRAINAGE OF GALLBLADDER WITH BIOPSY;  Surgeon: Jovita Kussmaul, MD;  Location: [facility];  Service: General;  Laterality: N/A;  . COLONOSCOPY W/ POLYPECTOMY    . COLONOSCOPY WITH PROPOFOL N/A 09/16/2018   Procedure: COLONOSCOPY WITH PROPOFOL;  Surgeon: Wilford Corner, MD;  Location: [facility];  Service: Endoscopy;  Laterality: N/A;  . FECAL TRANSPLANT N/A 09/16/2018   Procedure: FECAL TRANSPLANT;  Surgeon: Wilford Corner, MD;  Location: [facility];  Service: Endoscopy;  Laterality: N/A;  . IR CATHETER TUBE CHANGE  04/14/2018  . IR CHOLANGIOGRAM EXISTING TUBE  03/17/2018  . IR PERC CHOLECYSTOSTOMY  01/31/2018  . IR RADIOLOGIST EVAL & MGMT  03/02/2018    There were no vitals filed for this visit.  Subjective Assessment - 01/17/20 1007    Subjective  He tried to work the lock mechanism while prosthesis was not on him. But it was hard.    Pertinent History  R TFA, CHF, HTN, DM2, neuropathy, PVD, arthritis, gout, visual impairment Rt eye, glaucoma    Limitations  Lifting;Standing;Walking;House hold activities    Patient Stated Goals  To use prosthesis to walk in home & community and drive again.    Currently in Pain?  No/denies    Pain Onset  1 to 4 weeks ago  [facility] PT Treatment/Exercise - 01/17/20 1015      Transfers   Transfers  Sit to Stand;Stand to Sit;Lateral/Scoot Transfers;Supine to Sit;Sit to Supine;Stand Pivot Transfers    Sit to Stand  3: Mod assist;4: Min assist;With upper extremity assist;With armrests;From chair/3-in-1   to RW with PT stabilizing RW w/prosthetic knee locking    Sit to Stand Details  Verbal cues for sequencing;Verbal cues for technique;Verbal cues for safe use of DME/AE;Manual facilitation for weight shifting;Visual cues for safe use of DME/AE    Sit to Stand Details (indicate cue type and reason)  Verbal cues on not  pulling on RW with RUE and locking prosthetic knee    Stand to Sit  4: Min guard;With upper extremity assist;With armrests;To elevated surface;To chair/3-in-1;Other (comment)   from RW, prosthetic knee locked and unlocked once seated.    Stand to Sit Details (indicate cue type and reason)  Visual cues for safe use of DME/AE;Verbal cues for safe use of DME/AE;Verbal cues for technique;Manual facilitation for weight shifting    Stand Pivot Transfers  3: Mod assist   RW between w/c & chair w/armrests   Stand Pivot Transfer Details (indicate cue type and reason)  Manual & verbal cues for prosthesis under pelvis / COM and sequence to movement to Rt & Lt.       Ambulation/Gait   Ambulation/Gait  Yes    Ambulation/Gait Assistance  2: Max assist    Ambulation/Gait Assistance Details  Manual assist to prevent falling posteriorly & wt shifting over feet. Manual, verbal cues on sequence, RW distance, step length, wt shift & upright posture.     Ambulation Distance (Feet)  10 Feet   10'with 90* turn   Assistive device  Prosthesis;Rolling walker   locked prosthetic knee   Gait Pattern  Step-to pattern;Decreased step length - left;Decreased stance time - right;Decreased hip/knee flexion - right;Decreased weight shift to right;Right hip hike;Antalgic;Lateral hip instability;Trunk flexed    Ambulation Surface  Indoor;Level      Neuro Re-ed    Neuro Re-ed Details   Standing balance with RW with PT positioning prosthetic foot under pelvis and RW to facilitate weight over feet:  static 2 minutes with min guard / tactile cues to keep pelvis over feet;  reaching 2" anteriorly tapping PT hand (<3sec release of RW) with minA.  Standing with posterior pelvis to door frame and RW in front: Extending Lt knee and back extension (PT manual assist) to facilitate upright posture 5 sec hold 5 reps 2 sets.       Prosthetics   Prosthetic Care Comments   PT contacted prosthetist to request trying to decrease resistance to lock  mechanism without impacting dependability. He is going to work on it prior to next PT session.      Current prosthetic wear tolerance (days/week)   liner only daily    Current prosthetic wear tolerance (#hours/day)   5 hrs 2x/day, PT recommended donning after morning bath, removing 2hrs midday, then wear until bedtime.     Current prosthetic weight-bearing tolerance (hours/day)   No c/o limb pain or LBP with standing for 3 minutes or walking 3'      Edema  none    Residual limb condition   dry skin, gray coloring from dry skin, normal temperature, conical shape.      Education Provided  Skin check;Residual limb care;Prosthetic cleaning;Correct ply sock adjustment;Proper Donning;Proper wear schedule/adjustment;Other (comment)   see prosthetic care comments   Person(s) Educated  Patient  Education Method  Explanation;Verbal cues    Education Method  Verbalized understanding;Verbal cues required;Needs further instruction    Donning Prosthesis  Moderate assist;Supervision   modA standing portion & supervision seated portion              PT Short Term Goals - 01/17/20 1111      PT SHORT TERM GOAL #1   Title  Patient verbalizes adjusting ply socks to manage how deep limb seats in socket.  (All STGs Target Date: 01/20/2020)    Baseline  01/17/2020 progressing but not met due to unable to stand or walk outside of PT due to need of assistance.  He verbalizes difference in 1-ply, 3-ply & 5-ply socks but weight bearing pressure is true guidance for adjusting.    Time  4    Period  Weeks    Status  Not Met    Target Date  01/20/20      PT SHORT TERM GOAL #2   Title  Patient tolerates wear of liner daily >8 hrs total per day and prosthesis up to 1 hr with family or PT present.    Baseline  Partially MET 01/17/2020 Pt tolerates wear of liner 8 hrs total /day but unable to wear prosthesis as family has not been able to come over to help.    Time  4    Period  Weeks    Status  Partially Met     Target Date  01/20/20      PT SHORT TERM GOAL #3   Title  Patient sit to/from stand from elevated w/c to RW with prosthesis with supervision.    Baseline  NOT MET 01/17/2020 Patient sit /stand from 20" w/c to RW with prosthesis with moderate assist initially and progresses to minA by end of session.    Time  4    Period  Weeks    Status  Not Met    Target Date  01/20/20      PT SHORT TERM GOAL #4   Title  Patient standing balance with RW support & prosthesis static 2 minute with supervision and reaches 2" anteriorly & to knee level with contact assist.    Baseline  NOT MET 01/17/2020  Patient standing balance with RW 2 min static with min guard once positions prosthesis & RW. He reaches 2" anteriorly with minA.    Time  4    Period  Weeks    Status  Not Met    Target Date  01/20/20      PT SHORT TERM GOAL #5   Title  Patient ambulates 42' with RW & prosthesis with modA (2 people for safety)    Baseline  NOT MET 01/17/2020 Patient ambulates 10' with RW & locked prosthesis with maxA.    Time  4    Period  Weeks    Status  Not Met    Target Date  01/20/20        PT Short Term Goals - 01/17/20 1141      PT SHORT TERM GOAL #1   Title  Patient able to lock & unlock prosthetic knee with verbal cues.  (All STGs Target Date: 01/30/2020)    Time  2    Period  Weeks    Status  New    Target Date  01/30/20      PT SHORT TERM GOAL #2   Title  Patient tolerates wear of liner daily >10 hrs total per day and prosthesis up  to 1 hr with family present.    Time  2    Period  Weeks    Status  Revised    Target Date  01/30/20      PT SHORT TERM GOAL #3   Title  Patient sit to/from stand from elevated w/c to RW with prosthesis with supervision.    Time  2    Period  Weeks    Status  On-going    Target Date  01/30/20      PT SHORT TERM GOAL #4   Title  Patient standing balance with RW support & prosthesis static 2 minute with supervision and reaches 2" anteriorly & to lateral socket to lock  mechanism with contact assist.    Time  4    Period  Weeks    Status  Revised    Target Date  01/30/20      PT SHORT TERM GOAL #5   Title  Patient ambulates 10' with RW & prosthesis with modA    Time  4    Period  Weeks    Status  On-going    Target Date  01/30/20        PT Long Term Goals - 12/27/19 1252      PT LONG TERM GOAL #1   Title  Patient verbalizes & demonstrates proper prosthetic care including modified independent donning to enable safe utilization of prosthesis (All LTGs Target Date: 04/27/2020)    Time  6    Period  Months    Status  On-going    Target Date  04/27/20      PT LONG TERM GOAL #2   Title  Patient tolerates wear of prosthesis >75% of awake hours without skin or limb pain issues to enable function during his day.    Time  6    Period  Months    Status  On-going    Target Date  04/27/20      PT LONG TERM GOAL #3   Title  Standing balance with RW support & prosthesis: reaches 5" anteriorly, picks up object from floor, scans environment & manages clothes  modified independent.    Time  6    Period  Months    Status  On-going    Target Date  04/27/20      PT LONG TERM GOAL #4   Title  Patient ambulates 200' with RW & prosthesis with supervision.    Time  6    Period  Months    Status  On-going    Target Date  04/27/20      PT LONG TERM GOAL #5   Title  Patient negotiates ramps & curbs with RW & prosthesis with supervision.    Time  6    Period  Months    Status  On-going    Target Date  04/27/20            Plan - 01/17/20 1118    Clinical Impression Statement  Patient is progressing but very slowly due to inability to practice / work outside PT as he requires assistance for safety. His wife is disabled w/c bound and sons have not been able to come to his house to assist.  PT worked on standing posture & balance today which improved by end of session. PT contacted prosthetist to see if he can make lock mechanism a little less resistance  but still dependable and he is going to make changes.  Pt to contact  [employer] insurance to see if he can get a CNA / nursing services as PT could train CNA to assist at home.    Personal Factors and Comorbidities  Age;Comorbidity 3+;Fitness;Past/Current Experience;Time since onset of injury/illness/exacerbation;Transportation    Comorbidities  R TFA, CHF, HTN, DM2, neuropathy, PVD, arthritis, gout, visual impairment Rt eye, glaucoma,    Examination-Activity Limitations  Bed Mobility;Locomotion Level;Reach Overhead;Stairs;Stand;Toileting;Transfers    Stability/Clinical Decision Making  Evolving/Moderate complexity    Rehab Potential  Good    PT Frequency  2x / week    PT Duration  Other (comment)   22 weeks   PT Treatment/Interventions  ADLs/Self Care Home Management;DME Instruction;Gait training;Stair training;Functional mobility training;Therapeutic activities;Therapeutic exercise;Balance training;Neuromuscular re-education;Patient/family education;Prosthetic Training    PT Next Visit Plan  work towards updated STGs, work on standing posture / balance, transfers & gait with locked prosthetic knee    Consulted and Agree with Plan of Care  Patient       Patient will benefit from skilled therapeutic intervention in order to improve the following deficits and impairments:  Abnormal gait, Decreased activity tolerance, Decreased balance, Decreased endurance, Decreased knowledge of use of DME, Decreased mobility, Decreased range of motion, Decreased skin integrity, Decreased strength, Impaired flexibility, Postural dysfunction, Prosthetic Dependency  Visit Diagnosis: Other abnormalities of gait and mobility  Unsteadiness on feet  Abnormal posture  Muscle weakness  Stiffness of right hip, not elsewhere classified  Stiffness of left knee, not elsewhere classified  Stiffness of left hip, not elsewhere classified  Stiffness of left ankle, not elsewhere classified     Problem List Patient  Active Problem List   Diagnosis Date Noted  . Post-operative pain 04/21/2019  . Abnormality of gait 03/24/2019  . Hypoglycemia   . Hyperkalemia   . Hyponatremia   . Diabetic peripheral neuropathy ([place])   . Labile blood glucose   . Enteritis due to Clostridium difficile   . Blood glucose abnormal   . Diarrhea   . Benign essential HTN   . Hypoalbuminemia due to protein-calorie malnutrition ([place])   . Labile blood pressure   . 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 Postoperative pain   . Acute blood loss anemia   . Dyslipidemia   . Type 2 diabetes mellitus with peripheral neuropathy ([facility])   . S/P AKA (above knee amputation) ([place]) 11/12/2018  . Unilateral AKA, right ([place])   . Hypertensive crisis   . Diabetes mellitus type 2 in nonobese ([facility])   . Pleural effusion 11/08/2018  . Fever   . Open wound of right foot   . Sepsis ([place]) 11/03/2018  . Altered mental status   . Acute cystitis without hematuria   . Leukocytosis   . Pressure injury of skin 07/08/2018  . Clostridium difficile colitis 07/07/2018  . Hypokalemia 07/07/2018  . HLD (hyperlipidemia) 07/07/2018  . Acute diverticulitis 06/04/2018  . Cholecystitis 05/24/2018  . Cholecystitis with cholelithiasis 05/21/2018  . Acute systolic heart failure ([place]) 03/27/2018  . Diabetes mellitus without complication ([place]) 20/35/5974  . Hypertension 01/30/2018  . Acute cholecystitis 01/30/2018  . AKI (acute kidney injury) ([place]) 01/30/2018  . Normocytic anemia 01/30/2018  . PVD (peripheral vascular disease) ([place]) 09/05/2014    Jamey Reas PT, DPT 01/17/2020, 11:37 AM  [facility] [address] [place], [place], [address] Phone: [phone]   Fax:  [phone]  Name: LONALD TROIANI MRN: [id number] Date of Birth: 1946-08-07

## 2020-01-19 ENCOUNTER — Ambulatory Visit (INDEPENDENT_AMBULATORY_CARE_PROVIDER_SITE_OTHER): Payer: Medicare Other | Admitting: Physical Therapy

## 2020-01-19 ENCOUNTER — Encounter: Payer: Self-pay | Admitting: Physical Therapy

## 2020-01-19 ENCOUNTER — Other Ambulatory Visit: Payer: Self-pay

## 2020-01-19 DIAGNOSIS — M25672 Stiffness of left ankle, not elsewhere classified: Secondary | ICD-10-CM

## 2020-01-19 DIAGNOSIS — R2681 Unsteadiness on feet: Secondary | ICD-10-CM

## 2020-01-19 DIAGNOSIS — M6281 Muscle weakness (generalized): Secondary | ICD-10-CM | POA: Diagnosis not present

## 2020-01-19 DIAGNOSIS — R2689 Other abnormalities of gait and mobility: Secondary | ICD-10-CM | POA: Diagnosis not present

## 2020-01-19 DIAGNOSIS — R293 Abnormal posture: Secondary | ICD-10-CM

## 2020-01-19 DIAGNOSIS — M25651 Stiffness of right hip, not elsewhere classified: Secondary | ICD-10-CM

## 2020-01-19 DIAGNOSIS — M25662 Stiffness of left knee, not elsewhere classified: Secondary | ICD-10-CM

## 2020-01-19 DIAGNOSIS — M25652 Stiffness of left hip, not elsewhere classified: Secondary | ICD-10-CM

## 2020-01-19 NOTE — Therapy (Signed)
[facility] [place], [place], [address] Phone: [phone]   Fax:  [phone]  Physical Therapy Treatment  Patient Details  Name: Jeremiah [name] MRN: [id number] Date of Birth: [birth year] Referring Provider (PT): Clovia Cuff, MD   Encounter Date: 01/19/2020  PT End of Session - 01/19/20 1113    Visit Number  11    Number of Visits  45    Date for PT Re-Evaluation  02/01/20    Authorization Type  [employer] Medicare & [employer]BS    PT Start Time  1015    PT Stop Time  1100    PT Time Calculation (min)  45 min    Equipment Utilized During Treatment  Gait belt    Activity Tolerance  Patient tolerated treatment well    Behavior During Therapy  [facility] for tasks assessed/performed       Past Medical History:  Diagnosis Date  . Arthritis    "joints; shoulders, knees, hands, back" (05/21/2018)  . C. difficile diarrhea 04/2018  . Diastolic CHF ([place])   . High cholesterol   . History of gout   . Hypertension   . Peripheral vascular disease ([place])   . Pneumonia    "couple times" (05/21/2018)  . Sleep apnea    "has mask; won't use" (05/21/2018)  . Type II diabetes mellitus ([place])     Past Surgical History:  Procedure Laterality Date  . ABDOMINAL AORTOGRAM W/LOWER EXTREMITY N/A 11/01/2018   Procedure: ABDOMINAL AORTOGRAM W/LOWER EXTREMITY;  Surgeon: Lorretta Harp, MD;  Location: [facility];  Service: Cardiovascular;  Laterality: N/A;  . AMPUTATION Right 11/09/2018   Procedure: RIGHT - AMPUTATION ABOVE KNEE;  Surgeon: Waynetta Sandy, MD;  Location: [facility];  Service: Vascular;  Laterality: Right;  . CATARACT EXTRACTION W/ INTRAOCULAR LENS  IMPLANT, BILATERAL Bilateral   . CHOLECYSTECTOMY  05/21/2018   ATTEMPTED LAPAROSCOPIC CHOLECYSTECTOMY, OPEN DRAINAGE OF GALLBLADDER WITH BIOPSY  . CHOLECYSTECTOMY N/A 05/21/2018   Procedure: ATTEMPTED LAPAROSCOPIC CHOLECYSTECTOMY, OPEN DRAINAGE OF GALLBLADDER WITH BIOPSY;  Surgeon: Jovita Kussmaul, MD;  Location: [facility];  Service: General;  Laterality: N/A;  . COLONOSCOPY W/ POLYPECTOMY    . COLONOSCOPY WITH PROPOFOL N/A 09/16/2018   Procedure: COLONOSCOPY WITH PROPOFOL;  Surgeon: Wilford Corner, MD;  Location: [facility];  Service: Endoscopy;  Laterality: N/A;  . FECAL TRANSPLANT N/A 09/16/2018   Procedure: FECAL TRANSPLANT;  Surgeon: Wilford Corner, MD;  Location: [facility];  Service: Endoscopy;  Laterality: N/A;  . IR CATHETER TUBE CHANGE  04/14/2018  . IR CHOLANGIOGRAM EXISTING TUBE  03/17/2018  . IR PERC CHOLECYSTOSTOMY  01/31/2018  . IR RADIOLOGIST EVAL & MGMT  03/02/2018    There were no vitals filed for this visit.  Subjective Assessment - 01/19/20 1015    Subjective  He wants PT to call Amy the nurse with [facility] who used to come to house and see how to get a CNA.    Pertinent History  R TFA, CHF, HTN, DM2, neuropathy, PVD, arthritis, gout, visual impairment Rt eye, glaucoma    Limitations  Lifting;Standing;Walking;House hold activities    Patient Stated Goals  To use prosthesis to walk in home & community and drive again.    Currently in Pain?  No/denies    Pain Onset  1 to 4 weeks ago                       [facility] Adult PT Treatment/Exercise - 01/19/20  1015      Transfers   Transfers  Sit to Stand;Stand to Sit;Lateral/Scoot Transfers;Supine to Sit;Sit to Supine;Stand Pivot Transfers    Sit to Stand  3: Mod assist;4: Min assist;With upper extremity assist;With armrests;From chair/3-in-1   to RW with PT stabilizing RW w/prosthetic knee locking    Sit to Stand Details  Verbal cues for sequencing;Verbal cues for technique;Verbal cues for safe use of DME/AE;Manual facilitation for weight shifting;Visual cues for safe use of DME/AE    Stand to Sit  4: Min guard;With upper extremity assist;With armrests;To elevated surface;To chair/3-in-1;Other (comment)   from RW, prosthetic knee locked and unlocked once seated.    Stand to Sit Details  (indicate cue type and reason)  Visual cues for safe use of DME/AE;Verbal cues for safe use of DME/AE;Verbal cues for technique;Manual facilitation for weight shifting    Stand Pivot Transfers  3: Mod assist   RW between w/c & chair w/armrests   Stand Pivot Transfer Details (indicate cue type and reason)  Manual & verbal cues for prosthesis under pelvis / COM and sequence to movement to Rt & Lt.       Ambulation/Gait   Ambulation/Gait  Yes    Ambulation/Gait Assistance  2: Max assist    Ambulation/Gait Assistance Details  Visual (target bands on RW), tactile & verbal cues on sequencing, wt shift, RW movement & upright posture.  PT video on patient's phone last gait cycle for family to understand technique if assisting at home.      Ambulation Distance (Feet)  10 Feet   10' X 3 with 90* turn   Assistive device  Prosthesis;Rolling walker   locked prosthetic knee   Gait Pattern  Step-to pattern;Decreased step length - left;Decreased stance time - right;Decreased hip/knee flexion - right;Decreased weight shift to right;Right hip hike;Antalgic;Lateral hip instability;Trunk flexed    Ambulation Surface  Indoor;Level      Neuro Re-ed    Neuro Re-ed Details   --      Prosthetics   Prosthetic Care Comments   Prosthetist worked on lock mechanism to make it easier to operate. PT demo & verbal cues and patient able to lock / unlock prosthetic knee.     Current prosthetic wear tolerance (days/week)   liner only daily    Current prosthetic wear tolerance (#hours/day)   5-6 hrs 2x/day, PT recommended donning after morning bath, removing 2hrs midday, then wear until bedtime.     Current prosthetic weight-bearing tolerance (hours/day)   No c/o limb pain or LBP with standing for 3 minutes or walking 3'      Edema  none    Residual limb condition   dry skin, gray coloring from dry skin, normal temperature, conical shape.      Education Provided  Skin check;Residual limb care;Prosthetic cleaning;Correct ply  sock adjustment;Proper Donning;Proper wear schedule/adjustment;Other (comment)   see prosthetic care comments   Person(s) Educated  Patient    Education Method  Explanation;Demonstration;Tactile cues;Verbal cues    Education Method  Verbalized understanding;Tactile cues required;Verbal cues required;Needs further instruction    Donning Prosthesis  Moderate assist;Supervision   modA standing & supervision sitting portion              PT Short Term Goals - 01/17/20 1141      PT SHORT TERM GOAL #1   Title  Patient able to lock & unlock prosthetic knee with verbal cues.  (All STGs Target Date: 01/30/2020)    Time  2  Period  Weeks    Status  New    Target Date  01/30/20      PT SHORT TERM GOAL #2   Title  Patient tolerates wear of liner daily >10 hrs total per day and prosthesis up to 1 hr with family present.    Time  2    Period  Weeks    Status  Revised    Target Date  01/30/20      PT SHORT TERM GOAL #3   Title  Patient sit to/from stand from elevated w/c to RW with prosthesis with supervision.    Time  2    Period  Weeks    Status  On-going    Target Date  01/30/20      PT SHORT TERM GOAL #4   Title  Patient standing balance with RW support & prosthesis static 2 minute with supervision and reaches 2" anteriorly & to lateral socket to lock mechanism with contact assist.    Time  4    Period  Weeks    Status  Revised    Target Date  01/30/20      PT SHORT TERM GOAL #5   Title  Patient ambulates 10' with RW & prosthesis with modA    Time  4    Period  Weeks    Status  On-going    Target Date  01/30/20        PT Long Term Goals - 12/27/19 1252      PT LONG TERM GOAL #1   Title  Patient verbalizes & demonstrates proper prosthetic care including modified independent donning to enable safe utilization of prosthesis (All LTGs Target Date: 04/27/2020)    Time  6    Period  Months    Status  On-going    Target Date  04/27/20      PT LONG TERM GOAL #2   Title   Patient tolerates wear of prosthesis >75% of awake hours without skin or limb pain issues to enable function during his day.    Time  6    Period  Months    Status  On-going    Target Date  04/27/20      PT LONG TERM GOAL #3   Title  Standing balance with RW support & prosthesis: reaches 5" anteriorly, picks up object from floor, scans environment & manages clothes  modified independent.    Time  6    Period  Months    Status  On-going    Target Date  04/27/20      PT LONG TERM GOAL #4   Title  Patient ambulates 200' with RW & prosthesis with supervision.    Time  6    Period  Months    Status  On-going    Target Date  04/27/20      PT LONG TERM GOAL #5   Title  Patient negotiates ramps & curbs with RW & prosthesis with supervision.    Time  6    Period  Months    Status  On-going    Target Date  04/27/20            Plan - 01/19/20 1137    Clinical Impression Statement  Patient was able to operate the prosthetic knee lock mechanism with instruction today. Manual lock on prosthesis improved stability in stance.  Using colored bands on RW as visual target improved gait.    Personal Factors and Comorbidities  Age;Comorbidity  3+;Fitness;Past/Current Experience;Time since onset of injury/illness/exacerbation;Transportation    Comorbidities  R TFA, CHF, HTN, DM2, neuropathy, PVD, arthritis, gout, visual impairment Rt eye, glaucoma,    Examination-Activity Limitations  Bed Mobility;Locomotion Level;Reach Overhead;Stairs;Stand;Toileting;Transfers    Stability/Clinical Decision Making  Evolving/Moderate complexity    Rehab Potential  Good    PT Frequency  2x / week    PT Duration  Other (comment)   22 weeks   PT Treatment/Interventions  ADLs/Self Care Home Management;DME Instruction;Gait training;Stair training;Functional mobility training;Therapeutic activities;Therapeutic exercise;Balance training;Neuromuscular re-education;Patient/family education;Prosthetic Training    PT Next  Visit Plan  work towards updated STGs, work on standing posture / balance, transfers & gait with locked prosthetic knee    Consulted and Agree with Plan of Care  Patient       Patient will benefit from skilled therapeutic intervention in order to improve the following deficits and impairments:  Abnormal gait, Decreased activity tolerance, Decreased balance, Decreased endurance, Decreased knowledge of use of DME, Decreased mobility, Decreased range of motion, Decreased skin integrity, Decreased strength, Impaired flexibility, Postural dysfunction, Prosthetic Dependency  Visit Diagnosis: Other abnormalities of gait and mobility  Unsteadiness on feet  Abnormal posture  Muscle weakness  Stiffness of right hip, not elsewhere classified  Stiffness of left knee, not elsewhere classified  Stiffness of left hip, not elsewhere classified  Stiffness of left ankle, not elsewhere classified     Problem List Patient Active Problem List   Diagnosis Date Noted  . Post-operative pain 04/21/2019  . Abnormality of gait 03/24/2019  . Hypoglycemia   . Hyperkalemia   . Hyponatremia   . Diabetic peripheral neuropathy ([facility])   . Labile blood glucose   . Enteritis due to Clostridium difficile   . Blood glucose abnormal   . Diarrhea   . Benign essential HTN   . Hypoalbuminemia due to protein-calorie malnutrition ([facility])   . Labile blood pressure   . 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 Postoperative pain   . Acute blood loss anemia   . Dyslipidemia   . Type 2 diabetes mellitus with peripheral neuropathy ([facility])   . S/P AKA (above knee amputation) ([facility]) 11/12/2018  . Unilateral AKA, right ([facility])   . Hypertensive crisis   . Diabetes mellitus type 2 in nonobese ([facility])   . Pleural effusion 11/08/2018  . Fever   . Open wound of right foot   . Sepsis ([facility]) 11/03/2018  . Altered mental status   . Acute cystitis without hematuria   . Leukocytosis   . Pressure injury of skin 07/08/2018  .  Clostridium difficile colitis 07/07/2018  . Hypokalemia 07/07/2018  . HLD (hyperlipidemia) 07/07/2018  . Acute diverticulitis 06/04/2018  . Cholecystitis 05/24/2018  . Cholecystitis with cholelithiasis 05/21/2018  . Acute systolic heart failure (HCC) 03/27/2018  . Diabetes mellitus without complication (HCC) 01/30/2018  . Hypertension 01/30/2018  . Acute cholecystitis 01/30/2018  . AKI (acute kidney injury) ([facility]CC) 01/30/2018  . Normocytic anemia 01/30/2018  . PVD (peripheral vascular disease) (HCC) 09/05/2014    Vladimir Faster PT, DPT 01/19/2020, 11:39 AM  [facility] [address] [place], [place], [address] Phone: [phone]   Fax:  [phone]  Name: Jeremiah [name] MRN: [id number] Date of Birth: 1946/06/12

## 2020-01-20 ENCOUNTER — Encounter: Payer: Self-pay | Admitting: Physical Therapy

## 2020-01-23 ENCOUNTER — Encounter: Payer: Medicare Other | Admitting: Physical Therapy

## 2020-01-24 ENCOUNTER — Encounter: Payer: Medicare Other | Admitting: Physical Therapy

## 2020-01-30 ENCOUNTER — Encounter: Payer: Medicare Other | Admitting: Physical Therapy

## 2020-01-31 ENCOUNTER — Ambulatory Visit (INDEPENDENT_AMBULATORY_CARE_PROVIDER_SITE_OTHER): Payer: Medicare Other | Admitting: Physical Therapy

## 2020-01-31 ENCOUNTER — Other Ambulatory Visit: Payer: Self-pay

## 2020-01-31 ENCOUNTER — Encounter: Payer: Self-pay | Admitting: Physical Therapy

## 2020-01-31 DIAGNOSIS — M25652 Stiffness of left hip, not elsewhere classified: Secondary | ICD-10-CM

## 2020-01-31 DIAGNOSIS — M25662 Stiffness of left knee, not elsewhere classified: Secondary | ICD-10-CM

## 2020-01-31 DIAGNOSIS — R293 Abnormal posture: Secondary | ICD-10-CM | POA: Diagnosis not present

## 2020-01-31 DIAGNOSIS — R2689 Other abnormalities of gait and mobility: Secondary | ICD-10-CM

## 2020-01-31 DIAGNOSIS — M25672 Stiffness of left ankle, not elsewhere classified: Secondary | ICD-10-CM

## 2020-01-31 DIAGNOSIS — M6281 Muscle weakness (generalized): Secondary | ICD-10-CM

## 2020-01-31 DIAGNOSIS — R2681 Unsteadiness on feet: Secondary | ICD-10-CM | POA: Diagnosis not present

## 2020-01-31 DIAGNOSIS — M25651 Stiffness of right hip, not elsewhere classified: Secondary | ICD-10-CM

## 2020-01-31 NOTE — Therapy (Signed)
[place], [place], [address] Phone: [phone]   Fax:  [phone]  Physical Therapy Treatment / [facility]   Patient Details  Name: Jeremiah [name] MRN: [id number] Date of Birth: Apr 02, 1946 Referring Provider (PT): Clovia Cuff, MD   Encounter Date: 01/31/2020  PT End of Session - 01/31/20 1258    Visit Number  12    Number of Visits  45    Date for PT Re-Evaluation  02/01/20    Authorization Type  [employer] Medicare & [employer]BS    PT Start Time  0930    PT Stop Time  1015    PT Time Calculation (min)  45 min    Equipment Utilized During Treatment  Gait belt    Activity Tolerance  Patient tolerated treatment well    Behavior During Therapy  [facility] for tasks assessed/performed       Past Medical History:  Diagnosis Date  . Arthritis    "joints; shoulders, knees, hands, back" (05/21/2018)  . C. difficile diarrhea 04/2018  . Diastolic CHF ([place])   . High cholesterol   . History of gout   . Hypertension   . Peripheral vascular disease ([place])   . Pneumonia    "couple times" (05/21/2018)  . Sleep apnea    "has mask; won't use" (05/21/2018)  . Type II diabetes mellitus ([place])     Past Surgical History:  Procedure Laterality Date  . ABDOMINAL AORTOGRAM W/LOWER EXTREMITY N/A 11/01/2018   Procedure: ABDOMINAL AORTOGRAM W/LOWER EXTREMITY;  Surgeon: Lorretta Harp, MD;  Location: [facility];  Service: Cardiovascular;  Laterality: N/A;  . AMPUTATION Right 11/09/2018   Procedure: RIGHT - AMPUTATION ABOVE KNEE;  Surgeon: Waynetta Sandy, MD;  Location: [facility];  Service: Vascular;  Laterality: Right;  . CATARACT EXTRACTION W/ INTRAOCULAR LENS  IMPLANT, BILATERAL Bilateral   . CHOLECYSTECTOMY  05/21/2018   ATTEMPTED LAPAROSCOPIC CHOLECYSTECTOMY, OPEN DRAINAGE OF GALLBLADDER WITH BIOPSY  . CHOLECYSTECTOMY N/A 05/21/2018   Procedure: ATTEMPTED LAPAROSCOPIC CHOLECYSTECTOMY, OPEN DRAINAGE OF GALLBLADDER WITH BIOPSY;   Surgeon: Jovita Kussmaul, MD;  Location: [facility];  Service: General;  Laterality: N/A;  . COLONOSCOPY W/ POLYPECTOMY    . COLONOSCOPY WITH PROPOFOL N/A 09/16/2018   Procedure: COLONOSCOPY WITH PROPOFOL;  Surgeon: Wilford Corner, MD;  Location: [facility];  Service: Endoscopy;  Laterality: N/A;  . FECAL TRANSPLANT N/A 09/16/2018   Procedure: FECAL TRANSPLANT;  Surgeon: Wilford Corner, MD;  Location: [facility];  Service: Endoscopy;  Laterality: N/A;  . IR CATHETER TUBE CHANGE  04/14/2018  . IR CHOLANGIOGRAM EXISTING TUBE  03/17/2018  . IR PERC CHOLECYSTOSTOMY  01/31/2018  . IR RADIOLOGIST EVAL & MGMT  03/02/2018    There were no vitals filed for this visit.  Subjective Assessment - 01/31/20 0930    Subjective  He had diarrhea for 2 days last week. He has been wearing liner for 4hrs 2x/day. His family has not been there so not wearing the prosthesis.    Pertinent History  R TFA, CHF, HTN, DM2, neuropathy, PVD, arthritis, gout, visual impairment Rt eye, glaucoma    Limitations  Lifting;Standing;Walking;House hold activities    Patient Stated Goals  To use prosthesis to walk in home & community and drive again.    Currently in Pain?  No/denies    Pain Onset  1 to 4 weeks ago  [place] [facility] PT Treatment/Exercise - 01/31/20 0930      Transfers   Transfers  Sit to Stand;Stand to Sit;Lateral/Scoot Transfers;Supine to Sit;Sit to Supine;Stand Pivot Transfers    Sit to Stand  3: Mod assist;4: Min assist;With upper extremity assist;With armrests;From chair/3-in-1   to RW with PT stabilizing RW w/prosthetic knee locking    Sit to Stand Details  Verbal cues for sequencing;Verbal cues for technique;Verbal cues for safe use of DME/AE;Manual facilitation for weight shifting;Visual cues for safe use of DME/AE    Stand to Sit  4: Min guard;With upper extremity assist;With armrests;To elevated surface;To chair/3-in-1;Other (comment)   from RW, prosthetic knee locked  and unlocked once seated.    Stand to Sit Details (indicate cue type and reason)  Visual cues for safe use of DME/AE;Verbal cues for safe use of DME/AE;Verbal cues for technique;Manual facilitation for weight shifting    Stand Pivot Transfers  3: Mod assist   RW between w/c & chair w/armrests   Stand Pivot Transfer Details (indicate cue type and reason)  Manual & verbal cues for prosthesis under pelvis / COM and sequence to movement to Rt & Lt.       Ambulation/Gait   Ambulation/Gait  Yes    Ambulation/Gait Assistance  3: Mod assist    Ambulation/Gait Assistance Details  Visual (target bands on RW), tactile & verbal cues on sequencing, wt shift, RW movement & upright posture.    Ambulation Distance (Feet)  10 Feet   10' X 2 with 90* turn to sit & 5'   Assistive device  Prosthesis;Rolling walker   locked prosthetic knee   Gait Pattern  Step-to pattern;Decreased step length - left;Decreased stance time - right;Decreased hip/knee flexion - right;Decreased weight shift to right;Right hip hike;Antalgic;Lateral hip instability;Trunk flexed    Ambulation Surface  Indoor;Level    Gait Comments  SpO2 99% all activities HR rest 62 after gait: 78, 77, 75  with increased dyspnea as he fatigued with each gait.       Prosthetics   Prosthetic Care Comments   Patient able to lock & unlock prosthetic knee in sitting with PT reminders.     Current prosthetic wear tolerance (days/week)   liner only daily    Current prosthetic wear tolerance (#hours/day)   5-6 hrs 2x/day, PT recommended donning after morning bath, removing 2hrs midday, then wear until bedtime.     Current prosthetic weight-bearing tolerance (hours/day)   No c/o limb pain or LBP with standing for 3 minutes or walking 3'      Edema  none    Residual limb condition   dry skin, gray coloring from dry skin, normal temperature, conical shape.      Education Provided  Skin check;Residual limb care;Prosthetic cleaning;Correct ply sock adjustment;Proper  Donning;Proper wear schedule/adjustment;Other (comment)   see prosthetic care comments   Person(s) Educated  Patient    Education Method  Explanation;Demonstration;Tactile cues;Verbal cues    Education Method  Verbalized understanding;Returned demonstration;Tactile cues required;Verbal cues required;Needs further instruction    Donning Prosthesis  Moderate assist;Supervision   ModA standing portion              PT Short Term Goals - 01/31/20 1442      PT SHORT TERM GOAL #1   Title  Patient able to lock & unlock prosthetic knee with verbal cues.  (All STGs Target Date: 01/30/2020)    Baseline  MET 01/31/2020    Time  2    Period  [place]  Status  Achieved    Target Date  01/30/20      PT SHORT TERM GOAL #2   Title  Patient tolerates wear of liner daily >10 hrs total per day and prosthesis up to 1 hr with family present.    Baseline  Partially MET 01/31/2020 wears liner 10hrs /day without issues. Family is not visiting so no prosthesis wear outside of PT.    Time  2    Period  Weeks    Status  Partially Met    Target Date  01/30/20      PT SHORT TERM GOAL #3   Title  Patient sit to/from stand from elevated w/c to RW with prosthesis with supervision.    Baseline  NOT MET 01/31/2020 Sit /stand elevated w/c to RW with prosthesis with minA & verbal cues.    Time  2    Period  Weeks    Status  Not Met    Target Date  01/30/20      PT SHORT TERM GOAL #4   Title  Patient standing balance with RW support & prosthesis static 2 minute with supervision and reaches 2" anteriorly & to lateral socket to lock mechanism with contact assist.    Baseline  Partially MET 01/31/2020 once PT positions LEs & RW to facilitate stability, he performed above balance tasks with min guard.    Time  4    Period  Weeks    Status  Partially Met    Target Date  01/30/20      PT SHORT TERM GOAL #5   Title  Patient ambulates 10' with RW & prosthesis with modA    Baseline  MET 01/31/2020 for straight path, he  requires maxA to turn 90* to position in front of chair to sit.    Time  4    Period  Weeks    Status  Achieved    Target Date  01/30/20        PT Short Term Goals - 01/31/20 1632      PT SHORT TERM GOAL #1   Title  Patient is able to tighten suspension strap in standing with RW support with supervision.  (All STGs Target Date: 4/8//2021)    Time  1    Period  Months    Status  New    Target Date  03/01/20      PT SHORT TERM GOAL #2   Title  Patient tolerates wear of liner daily >12 hrs total per day and prosthesis up to 1 hr sitting only.    Time  1    Period  Months    Status  Revised    Target Date  03/01/20      PT SHORT TERM GOAL #3   Title  Patient sit to/from stand from elevated w/c to RW with prosthesis with supervision.    Time  1    Period  Months    Status  On-going    Target Date  03/01/20      PT SHORT TERM GOAL #4   Title  Patient standing balance with RW support & prosthesis static 2 minute with supervision and reaches 3" anteriorly & to lateral socket to lock mechanism with contact assist.    Time  1    Period  Months    Status  On-going    Target Date  03/01/20      PT SHORT TERM GOAL #5   Title  Patient ambulates 10'  with RW & prosthesis with modA including turning 90* to position to sit.    Time  1    Period  Months    Status  Revised    Target Date  03/01/20        PT Long Term Goals - 01/31/20 1447      PT LONG TERM GOAL #1   Title  Patient verbalizes & demonstrates proper prosthetic care including modified independent donning to enable safe utilization of prosthesis (All LTGs Target Date: 04/27/2020)    Time  6    Period  Months    Status  On-going    Target Date  04/27/20      PT LONG TERM GOAL #2   Title  Patient tolerates wear of prosthesis liner >75% of awake hours & prosthesis >4hrs /day without skin or limb pain issues to enable function during his day.    Time  6    Period  Months    Status  Revised    Target Date  04/27/20       PT LONG TERM GOAL #3   Title  Standing balance with RW support & prosthesis: reaches 5", scans environment & manages clothes  modified independent.    Time  6    Period  Months    Status  Revised    Target Date  04/27/20      PT LONG TERM GOAL #4   Title  Patient ambulates 51' with RW & prosthesis with minimal assist.    Time  6    Period  Months    Status  Revised    Target Date  04/27/20            Plan - 01/31/20 1454    Clinical Impression Statement  Patient is making very slow progress. He is elderly gentleman with significant medical issues including an above knee amputation that lives with his wife who is disabled & w/c bound so unable to assist him with moblity. His family does not visit to enable assistance. Therefore he is limited to activities standing & walking in PT only at this time which slows his progress. PT had prosthetist to add a mechanical knee lock to prosthesis in last month to increase stability in stance. PT set initial plan of care for 6 months due to level of deconditioning. He is making progress just slowly and would benefit from continuing PT as per plan of care for 3 more months.    Personal Factors and Comorbidities  Age;Comorbidity 3+;Fitness;Past/Current Experience;Time since onset of injury/illness/exacerbation;Transportation    Comorbidities  R TFA, CHF, HTN, DM2, neuropathy, PVD, arthritis, gout, visual impairment Rt eye, glaucoma,    Examination-Activity Limitations  Bed Mobility;Locomotion Level;Reach Overhead;Stairs;Stand;Toileting;Transfers    Stability/Clinical Decision Making  Evolving/Moderate complexity    Rehab Potential  Good    PT Frequency  2x / week    PT Duration  12 weeks    PT Treatment/Interventions  ADLs/Self Care Home Management;DME Instruction;Gait training;Stair training;Functional mobility training;Therapeutic activities;Therapeutic exercise;Balance training;Neuromuscular re-education;Patient/family education;Prosthetic  Training    PT Next Visit Plan  work towards updated STGs, work on standing posture / balance, transfers & gait with locked prosthetic knee    Consulted and Agree with Plan of Care  Patient       Patient will benefit from skilled therapeutic intervention in order to improve the following deficits and impairments:  Abnormal gait, Decreased activity tolerance, Decreased balance, Decreased endurance, Decreased knowledge of use of DME, Decreased mobility,  Decreased range of motion, Decreased skin integrity, Decreased strength, Impaired flexibility, Postural dysfunction, Prosthetic Dependency  Visit Diagnosis: Other abnormalities of gait and mobility  Unsteadiness on feet  Abnormal posture  Muscle weakness  Stiffness of right hip, not elsewhere classified  Stiffness of left knee, not elsewhere classified  Stiffness of left hip, not elsewhere classified  Stiffness of left ankle, not elsewhere classified     Problem List Patient Active Problem List   Diagnosis Date Noted  . Post-operative pain 04/21/2019  . Abnormality of gait 03/24/2019  . Hypoglycemia   . Hyperkalemia   . Hyponatremia   . Diabetic peripheral neuropathy ([place])   . Labile blood glucose   . Enteritis due to Clostridium difficile   . Blood glucose abnormal   . Diarrhea   . Benign essential HTN   . Hypoalbuminemia due to protein-calorie malnutrition ([place])   . Labile blood pressure   . 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 Postoperative pain   . Acute blood loss anemia   . Dyslipidemia   . Type 2 diabetes mellitus with peripheral neuropathy ([facility])   . S/P AKA (above knee amputation) ([place]) 11/12/2018  . Unilateral AKA, right ([place])   . Hypertensive crisis   . Diabetes mellitus type 2 in nonobese ([facility])   . Pleural effusion 11/08/2018  . Fever   . Open wound of right foot   . Sepsis ([place]) 11/03/2018  . Altered mental status   . Acute cystitis without hematuria   . Leukocytosis   . Pressure injury of  skin 07/08/2018  . Clostridium difficile colitis 07/07/2018  . Hypokalemia 07/07/2018  . HLD (hyperlipidemia) 07/07/2018  . Acute diverticulitis 06/04/2018  . Cholecystitis 05/24/2018  . Cholecystitis with cholelithiasis 05/21/2018  . Acute systolic heart failure ([place]) 03/27/2018  . Diabetes mellitus without complication ([place]) 14/43/6016  . Hypertension 01/30/2018  . Acute cholecystitis 01/30/2018  . AKI (acute kidney injury) ([place]) 01/30/2018  . Normocytic anemia 01/30/2018  . PVD (peripheral vascular disease) ([place]) 09/05/2014    Jamey Reas PT, DPT 01/31/2020, 2:59 PM  [facility] [address] [place], [place], [address] Phone: [phone]   Fax:  [phone]  Name: Jeremiah [name] MRN: [id number] Date of Birth: 14-May-1946

## 2020-02-03 ENCOUNTER — Encounter: Payer: Medicare Other | Admitting: Rehabilitative and Restorative Service Providers"

## 2020-02-07 ENCOUNTER — Encounter: Payer: Self-pay | Admitting: Physical Therapy

## 2020-02-07 ENCOUNTER — Ambulatory Visit (INDEPENDENT_AMBULATORY_CARE_PROVIDER_SITE_OTHER): Payer: Medicare Other | Admitting: Physical Therapy

## 2020-02-07 ENCOUNTER — Other Ambulatory Visit: Payer: Self-pay

## 2020-02-07 DIAGNOSIS — R293 Abnormal posture: Secondary | ICD-10-CM | POA: Diagnosis not present

## 2020-02-07 DIAGNOSIS — R2689 Other abnormalities of gait and mobility: Secondary | ICD-10-CM | POA: Diagnosis not present

## 2020-02-07 DIAGNOSIS — M25651 Stiffness of right hip, not elsewhere classified: Secondary | ICD-10-CM

## 2020-02-07 DIAGNOSIS — R2681 Unsteadiness on feet: Secondary | ICD-10-CM | POA: Diagnosis not present

## 2020-02-07 DIAGNOSIS — M25672 Stiffness of left ankle, not elsewhere classified: Secondary | ICD-10-CM

## 2020-02-07 DIAGNOSIS — M25662 Stiffness of left knee, not elsewhere classified: Secondary | ICD-10-CM

## 2020-02-07 DIAGNOSIS — M6281 Muscle weakness (generalized): Secondary | ICD-10-CM

## 2020-02-07 DIAGNOSIS — M25652 Stiffness of left hip, not elsewhere classified: Secondary | ICD-10-CM

## 2020-02-07 NOTE — Therapy (Signed)
[facility] Physical Therapy [address] [place], [place], [address] Phone: [phone]   Fax:  [phone]  Physical Therapy Treatment  Patient Details  Name: Jeremiah [name] MRN: [id number] Date of Birth: August 08, 1946 Referring Provider (PT): Annita Brod, MD   Encounter Date: 02/07/2020  PT End of Session - 02/07/20 1023    Visit Number  13    Number of Visits  45    Date for PT Re-Evaluation  02/01/20    Authorization Type  [employer] Medicare & BCBS    PT Start Time  0929    PT Stop Time  1014    PT Time Calculation (min)  45 min    Equipment Utilized During Treatment  Gait belt    Activity Tolerance  Patient tolerated treatment well    Behavior During Therapy  [facility] for tasks assessed/performed       Past Medical History:  Diagnosis Date  . Arthritis    "joints; shoulders, knees, hands, back" (05/21/2018)  . C. difficile diarrhea 04/2018  . Diastolic CHF (HCC)   . High cholesterol   . History of gout   . Hypertension   . Peripheral vascular disease (HCC)   . Pneumonia    "couple times" (05/21/2018)  . Sleep apnea    "has mask; won't use" (05/21/2018)  . Type II diabetes mellitus (HCC)     Past Surgical History:  Procedure Laterality Date  . ABDOMINAL AORTOGRAM W/LOWER EXTREMITY N/A 11/01/2018   Procedure: ABDOMINAL AORTOGRAM W/LOWER EXTREMITY;  Surgeon: Runell Gess, MD;  [facility] INVASIVE [facility] LAB;  Service: Cardiovascular;  Laterality: N/A;  . AMPUTATION Right 11/09/2018   Procedure: RIGHT - AMPUTATION ABOVE KNEE;  Surgeon: Maeola Harman, MD;  Location: [facility] OR;  Service: Vascular;  Laterality: Right;  . CATARACT EXTRACTION W/ INTRAOCULAR LENS  IMPLANT, BILATERAL Bilateral   . CHOLECYSTECTOMY  05/21/2018   ATTEMPTED LAPAROSCOPIC CHOLECYSTECTOMY, OPEN DRAINAGE OF GALLBLADDER WITH BIOPSY  . CHOLECYSTECTOMY N/A 05/21/2018   Procedure: ATTEMPTED LAPAROSCOPIC CHOLECYSTECTOMY, OPEN DRAINAGE OF GALLBLADDER WITH BIOPSY;  Surgeon: Griselda Miner, MD;  Location: [facility] OR;  Service: General;  Laterality: N/A;  . COLONOSCOPY W/ POLYPECTOMY    . COLONOSCOPY WITH PROPOFOL N/A 09/16/2018   Procedure: COLONOSCOPY WITH PROPOFOL;  Surgeon: Charlott Rakes, MD;  Location: [facility] ENDOSCOPY;  Service: Endoscopy;  Laterality: N/A;  . 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 IR CATHETER TUBE CHANGE  04/14/2018  . IR CHOLANGIOGRAM EXISTING TUBE  03/17/2018  . IR PERC CHOLECYSTOSTOMY  01/31/2018  . IR RADIOLOGIST EVAL & MGMT  03/02/2018    There were no vitals filed for this visit.  Subjective Assessment - 02/07/20 0929    Subjective  He is wearing liner 4hrs 2x/day without any issues. His son has not been over to his house so unable to use prosthesis.    Pertinent History  R TFA, CHF, HTN, DM2, neuropathy, PVD, arthritis, gout, visual impairment Rt eye, glaucoma    Limitations  Lifting;Standing;Walking;House hold activities    Patient Stated Goals  To use prosthesis to walk in home & community and drive again.    Currently in Pain?  No/denies    Pain Onset  1 to 4 weeks ago                       [facility] Adult PT Treatment/Exercise - 02/07/20 0930  Transfers   Transfers  Sit to Stand;Stand to Sit;Lateral/Scoot Transfers;Supine to Sit;Sit to Supine;Stand Pivot Transfers    Sit to Stand  3: Mod assist;4: Min assist;With upper extremity assist;With armrests;From chair/3-in-1   to RW with PT stabilizing RW w/prosthetic knee locking    Sit to Stand Details  Verbal cues for sequencing;Verbal cues for technique;Verbal cues for safe use of DME/AE;Manual facilitation for weight shifting;Visual cues for safe use of DME/AE    Sit to Stand Details (indicate cue type and reason)  worked on standing to sink with w/c turned 90* to front of sink due to locked knee. PT demo, verbal & manual cues on technique. 1 rep with w/c to  right of sink & 1 rep with w/c to left of sink.  w/c to left of sink was easier for pt.     Stand to Sit  4: Min guard;With upper extremity assist;With armrests;To elevated surface;To chair/3-in-1;Other (comment)   from RW, prosthetic knee locked and unlocked once seated.    Stand to Sit Details (indicate cue type and reason)  Visual cues for safe use of DME/AE;Verbal cues for safe use of DME/AE;Verbal cues for technique;Manual facilitation for weight shifting    Stand to Sit Details  worked on sitting from standing at sink with w/c turned 90* to front of sink due to locked knee. PT demo, verbal & manual cues on technique. 1 rep with w/c to right of sink & 1 rep with w/c to left of sink.  w/c to left of sink was easier for pt.     Stand Pivot Transfers  3: Mod assist   RW between w/c & toilet with grab bar to right of toilet   Stand Pivot Transfer Details (indicate cue type and reason)  Pt needed to toilet so PT used as opportunity to work on transfer on/off toilet - manual & verbal cues on technique.  Grab bar to left of toilet would work better with right TFA.       Ambulation/Gait   Ambulation/Gait  Yes    Ambulation/Gait Assistance  3: Mod assist    Ambulation/Gait Assistance Details  manual, verbal & demo cues on upright posture & wt shifting over prosthesis in stance.     Ambulation Distance (Feet)  10 Feet   10' X 2 with 90* turn to sit   Assistive device  Prosthesis;Rolling walker   locked prosthetic knee   Gait Pattern  Step-to pattern;Decreased step length - left;Decreased stance time - right;Decreased hip/knee flexion - right;Decreased weight shift to right;Right hip hike;Antalgic;Lateral hip instability;Trunk flexed    Ambulation Surface  Indoor;Level    Gait Comments  SpO2 99% all activities HR rest 62 after gait: 78, 77, 75  with increased dyspnea as he fatigued with each gait.       Self-Care   Self-Care  ADL's    ADL's  toileting with TFA prosthesis seated. Verbal cues on  positioning on toilet to avoid urinating on toilet.  See stand-pivot transfer note.       Neuro Re-ed    Neuro Re-ed Details   standing at sink with BUE support - tactile & verbal cues on upright posture. Pt tolerated 1 minute static stance with minA.       Prosthetics   Prosthetic Care Comments   --    Current prosthetic wear tolerance (days/week)   liner only daily    Current prosthetic wear tolerance (#hours/day)   5hrs 2x/day. PT recommended increasing to 6hrs 2x/day  with off 2 hrs between. Continue with liner only unless son or other person is available to assist.     Current prosthetic weight-bearing tolerance (hours/day)   No c/o limb pain or LBP with standing for 3 minutes or walking 3'      Edema  none    Residual limb condition   dry skin, gray coloring from dry skin, normal temperature, conical shape.      Education Provided  Skin check;Residual limb care;Proper Donning;Proper wear schedule/adjustment;Other (comment)   see prosthetic care comments   Person(s) Educated  Patient    Education Method  Explanation;Verbal cues    Education Method  Verbalized understanding;Verbal cues required    Donning Prosthesis  Supervision;Moderate assist   modA standing portion              PT Short Term Goals - 01/31/20 1632      PT SHORT TERM GOAL #1   Title  Patient is able to tighten suspension strap in standing with RW support with supervision.  (All STGs Target Date: 4/8//2021)    Time  1    Period  Months    Status  New    Target Date  03/01/20      PT SHORT TERM GOAL #2   Title  Patient tolerates wear of liner daily >12 hrs total per day and prosthesis up to 1 hr sitting only.    Time  1    Period  Months    Status  Revised    Target Date  03/01/20      PT SHORT TERM GOAL #3   Title  Patient sit to/from stand from elevated w/c to RW with prosthesis with supervision.    Time  1    Period  Months    Status  On-going    Target Date  03/01/20      PT SHORT TERM GOAL  #4   Title  Patient standing balance with RW support & prosthesis static 2 minute with supervision and reaches 3" anteriorly & to lateral socket to lock mechanism with contact assist.    Time  1    Period  Months    Status  On-going    Target Date  03/01/20      PT SHORT TERM GOAL #5   Title  Patient ambulates 10' with RW & prosthesis with modA including turning 90* to position to sit.    Time  1    Period  Months    Status  Revised    Target Date  03/01/20        PT Long Term Goals - 01/31/20 1447      PT LONG TERM GOAL #1   Title  Patient verbalizes & demonstrates proper prosthetic care including modified independent donning to enable safe utilization of prosthesis (All LTGs Target Date: 04/27/2020)    Time  6    Period  Months    Status  On-going    Target Date  04/27/20      PT LONG TERM GOAL #2   Title  Patient tolerates wear of prosthesis liner >75% of awake hours & prosthesis >4hrs /day without skin or limb pain issues to enable function during his day.    Time  6    Period  Months    Status  Revised    Target Date  04/27/20      PT LONG TERM GOAL #3   Title  Standing balance with RW support &  prosthesis: reaches 5", scans environment & manages clothes  modified independent.    Time  6    Period  Months    Status  Revised    Target Date  04/27/20      PT LONG TERM GOAL #4   Title  Patient ambulates 77' with RW & prosthesis with minimal assist.    Time  6    Period  Months    Status  Revised    Target Date  04/27/20            Plan - 02/07/20 1128    Clinical Impression Statement  PT added toileting with prosthesis including stand pivot transfer with RW and standing at sink.  PT continues to work on prosthetic gait with RW & locked prosthesis. Second gait today required less assistance with improved wt shift over prosthesis.    Personal Factors and Comorbidities  Age;Comorbidity 3+;Fitness;Past/Current Experience;Time since onset of  injury/illness/exacerbation;Transportation    Comorbidities  R TFA, CHF, HTN, DM2, neuropathy, PVD, arthritis, gout, visual impairment Rt eye, glaucoma,    Examination-Activity Limitations  Bed Mobility;Locomotion Level;Reach Overhead;Stairs;Stand;Toileting;Transfers    Stability/Clinical Decision Making  Evolving/Moderate complexity    Rehab Potential  Good    PT Frequency  2x / week    PT Duration  12 weeks    PT Treatment/Interventions  ADLs/Self Care Home Management;DME Instruction;Gait training;Stair training;Functional mobility training;Therapeutic activities;Therapeutic exercise;Balance training;Neuromuscular re-education;Patient/family education;Prosthetic Training    PT Next Visit Plan  work towards updated STGs, work on standing at sink with w/c to right of sink for posture / balance, transfers & gait with locked prosthetic knee    Consulted and Agree with Plan of Care  Patient       Patient will benefit from skilled therapeutic intervention in order to improve the following deficits and impairments:  Abnormal gait, Decreased activity tolerance, Decreased balance, Decreased endurance, Decreased knowledge of use of DME, Decreased mobility, Decreased range of motion, Decreased skin integrity, Decreased strength, Impaired flexibility, Postural dysfunction, Prosthetic Dependency  Visit Diagnosis: Other abnormalities of gait and mobility  Unsteadiness on feet  Abnormal posture  Stiffness of right hip, not elsewhere classified  Stiffness of left knee, not elsewhere classified  Stiffness of left hip, not elsewhere classified  Stiffness of left ankle, not elsewhere classified  Muscle weakness     Problem List Patient Active Problem List   Diagnosis Date Noted  . Post-operative pain 04/21/2019  . Abnormality of gait 03/24/2019  . Hypoglycemia   . Hyperkalemia   . Hyponatremia   . Diabetic peripheral neuropathy ([place])   . Labile blood glucose   . Enteritis due to  Clostridium difficile   . Blood glucose abnormal   . Diarrhea   . Benign essential HTN   . Hypoalbuminemia due to protein-calorie malnutrition ([place])   . Labile blood pressure   . 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 Postoperative pain   . Acute blood loss anemia   . Dyslipidemia   . Type 2 diabetes mellitus with peripheral neuropathy (HCC)   . S/P AKA (above knee amputation) ([place]) 11/12/2018  . Unilateral AKA, right ([place])   . Hypertensive crisis   . Diabetes mellitus type 2 in nonobese (HCC)   . Pleural effusion 11/08/2018  . Fever   . Open wound of right foot   . Sepsis ([place]) 11/03/2018  . Altered mental status   . Acute cystitis without hematuria   . Leukocytosis   . Pressure injury of skin 07/08/2018  .  Clostridium difficile colitis 07/07/2018  . Hypokalemia 07/07/2018  . HLD (hyperlipidemia) 07/07/2018  . Acute diverticulitis 06/04/2018  . Cholecystitis 05/24/2018  . Cholecystitis with cholelithiasis 05/21/2018  . Acute systolic heart failure (HCC) 03/27/2018  . Diabetes mellitus without complication (HCC) 01/30/2018  . Hypertension 01/30/2018  . Acute cholecystitis 01/30/2018  . AKI (acute kidney injury) (HCC) 01/30/2018  . Normocytic anemia 01/30/2018  . PVD (peripheral vascular disease) (HCC) 09/05/2014    Vladimir Faster PT, DPT 02/07/2020, 11:31 AM  [facility] Physical Therapy [address] [place], [place], [address] Phone: [phone]   Fax:  [phone]  Name: Jeremiah [name] MRN: [id number] Date of Birth: 05/30/1946

## 2020-02-10 ENCOUNTER — Encounter: Payer: Medicare Other | Admitting: Rehabilitative and Restorative Service Providers"

## 2020-02-13 ENCOUNTER — Encounter: Payer: Medicare Other | Admitting: Physical Therapy

## 2020-02-14 ENCOUNTER — Encounter: Payer: Medicare Other | Admitting: Physical Therapy

## 2020-02-16 ENCOUNTER — Encounter: Payer: Self-pay | Admitting: Gastroenterology

## 2020-02-20 ENCOUNTER — Encounter: Payer: Medicare Other | Admitting: Physical Therapy

## 2020-02-21 ENCOUNTER — Encounter: Payer: Medicare Other | Admitting: Physical Therapy

## 2020-02-28 ENCOUNTER — Encounter: Payer: Medicare Other | Admitting: Physical Therapy

## 2020-03-01 ENCOUNTER — Encounter: Payer: Medicare Other | Admitting: Physical Therapy

## 2020-03-02 ENCOUNTER — Ambulatory Visit: Payer: Medicare Other | Admitting: Gastroenterology

## 2020-03-07 ENCOUNTER — Encounter: Payer: Medicare Other | Admitting: Rehabilitative and Restorative Service Providers"

## 2020-03-08 ENCOUNTER — Encounter: Payer: Medicare Other | Admitting: Rehabilitative and Restorative Service Providers"

## 2020-03-09 ENCOUNTER — Encounter: Payer: Medicare Other | Admitting: Rehabilitative and Restorative Service Providers"

## 2020-03-12 ENCOUNTER — Encounter: Payer: Medicare Other | Admitting: Physical Therapy

## 2020-03-13 ENCOUNTER — Encounter: Payer: Medicare Other | Admitting: Physical Therapy

## 2020-03-15 ENCOUNTER — Encounter: Payer: Medicare Other | Admitting: Physical Therapy

## 2020-03-19 ENCOUNTER — Encounter: Payer: Self-pay | Admitting: *Deleted

## 2020-03-20 ENCOUNTER — Encounter: Payer: Medicare Other | Admitting: Physical Therapy

## 2020-03-22 ENCOUNTER — Encounter: Payer: Medicare Other | Admitting: Physical Therapy

## 2020-03-23 ENCOUNTER — Encounter: Payer: Self-pay | Admitting: Physician Assistant

## 2020-03-23 ENCOUNTER — Ambulatory Visit (INDEPENDENT_AMBULATORY_CARE_PROVIDER_SITE_OTHER): Payer: Medicare Other | Admitting: Physician Assistant

## 2020-03-23 VITALS — BP 124/60 | HR 60 | Temp 98.0°F

## 2020-03-23 DIAGNOSIS — K6289 Other specified diseases of anus and rectum: Secondary | ICD-10-CM

## 2020-03-23 DIAGNOSIS — R159 Full incontinence of feces: Secondary | ICD-10-CM

## 2020-03-23 NOTE — Progress Notes (Signed)
Attending Physician's Attestation   I have reviewed the chart.   I agree with the Advanced Practitioner's note, impression, and recommendations with any updates as below.    Aldean Pipe Mansouraty, MD [facility] Advanced Endoscopy Office # [phone]  

## 2020-03-23 NOTE — H&P (View-Only) (Signed)
Chief Complaint: Diarrhea and hemorrhoids  HPI:    Jeremiah [name] is a 74 year old African-American male with a past medical history as listed below including C. difficile diarrhea and diastolic CHF (03/28/2018 echo with LVEF 50-55%) as well as type 2 diabetes with right AKA, who was referred to me by Dr. Annita Brod  for a complaint of chronic diarrhea and hemorrhoids.      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    09/16/2018 patient had a colonoscopy with Dr. Bosie Clos.  Preparation of the colon was fair, diverticulosis in the entire colon, internal hemorrhoids.    01/13/2019 C. difficile negative.    02/14/2020 office visit with PCP.    Today, the patient presents to clinic accompanied by his son who does assist with his care at home.  History is hard to garnish from them, patient tells me that he has been "stooling" multiple times a day, it eventually comes out that often this is when he does not even know that he has done so.  He was tried on Cholestyramine which made no difference to his symptoms.  Currently he is using Imodium 1-2 tabs in the morning which does seem to help some.      His largest complaint today is of a sharp rectal pain which he feels with a bowel movement but also without a bowel movement.  In fact, at time of my interview he has to stop talking and scoot himself up in his wheelchair in order to hold weight off of his rectum, this pain lasts around 15 to 20 seconds and then will go away and is very intermittent.  Tells me all of this has gotten worse over the past month and the fact that he cannot tell his stools coming out is a new issue as well.    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    Denies fever, chills or blood in his stool.  Past Medical History:  Diagnosis Date  . Aortic atherosclerosis (HCC)   . Arthritis    "joints; shoulders, knees, hands, back" (05/21/2018)  . Atherosclerosis of coronary artery   . C. difficile diarrhea  04/2018  . Diastolic CHF (HCC)   . Diverticulosis   . High cholesterol   . History of gout   . Hypertension   . IDA (iron deficiency anemia)    from referral Dr Darleene Cleaver  . Internal hemorrhoids   . Peripheral vascular disease (HCC)   . Pneumonia    "couple times" (05/21/2018)  . Sleep apnea    "has mask; won't use" (05/21/2018)  . Type II diabetes mellitus (HCC)     Past Surgical History:  Procedure Laterality Date  . ABDOMINAL AORTOGRAM W/LOWER EXTREMITY N/A 11/01/2018   Procedure: ABDOMINAL AORTOGRAM W/LOWER EXTREMITY;  Surgeon: Runell Gess, MD;  [facility]: [facility] INVASIVE CV LAB;  Service: Cardiovascular;  Laterality: N/A;  . AMPUTATION Right 11/09/2018   Procedure: RIGHT - AMPUTATION ABOVE KNEE;  Surgeon: Maeola Harman, MD;  Location: [facility] OR;  Service: Vascular;  Laterality: Right;  . CATARACT EXTRACTION W/ INTRAOCULAR LENS  IMPLANT, BILATERAL Bilateral   . CHOLECYSTECTOMY  05/21/2018   ATTEMPTED LAPAROSCOPIC CHOLECYSTECTOMY, OPEN DRAINAGE OF GALLBLADDER WITH BIOPSY  . CHOLECYSTECTOMY N/A 05/21/2018   Procedure: ATTEMPTED LAPAROSCOPIC CHOLECYSTECTOMY, OPEN DRAINAGE OF GALLBLADDER WITH BIOPSY;  Surgeon: Griselda Miner, MD;  Location: [facility] OR;  Service: General;  Laterality: N/A;  . COLONOSCOPY W/ POLYPECTOMY    . COLONOSCOPY WITH PROPOFOL N/A  09/16/2018   Procedure: COLONOSCOPY WITH PROPOFOL;  Surgeon: Charlott Rakes, MD;  Location: [facility];  Service: Endoscopy;  Laterality: N/A;  . 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 IR CATHETER TUBE CHANGE  04/14/2018  . IR CHOLANGIOGRAM EXISTING TUBE  03/17/2018  . IR PERC CHOLECYSTOSTOMY  01/31/2018  . IR RADIOLOGIST EVAL & MGMT  03/02/2018    Current Outpatient Medications  Medication Sig Dispense Refill  . acetaminophen (TYLENOL) 325 MG tablet Take 2 tablets (650 mg total) by mouth every 6 (six) hours  as needed for mild pain (or Fever >/= 101).    Marland Kitchen amLODipine (NORVASC) 10 MG tablet Take 1 tablet (10 mg total) by mouth daily. 30 tablet 0  . aspirin 81 MG tablet Take 81 mg by mouth daily.    Marland Kitchen atorvastatin (LIPITOR) 20 MG tablet Take 1 tablet (20 mg total) by mouth daily. 30 tablet 1  . brimonidine (ALPHAGAN) 0.2 % ophthalmic solution Place 1 drop into both eyes 2 (two) times daily. 5 mL 12  . gabapentin (NEURONTIN) 100 MG capsule TAKE 1 CAPSULE (100 MG TOTAL) BY MOUTH 3 (THREE) TIMES DAILY. 90 capsule 1  . glimepiride (AMARYL) 1 MG tablet Take 1 tablet (1 mg total) by mouth daily with breakfast. 30 tablet 1  . loperamide (IMODIUM A-D) 2 MG tablet Take 2-4 mg by mouth as needed for diarrhea or loose stools.    Marland Kitchen losartan (COZAAR) 100 MG tablet Take 1 tablet (100 mg total) by mouth daily. 30 tablet 3  . metoprolol succinate (TOPROL-XL) 50 MG 24 hr tablet Take 1 tablet by mouth daily.    . tamsulosin (FLOMAX) 0.4 MG CAPS capsule tamsulosin 0.4 mg capsule  TAKE ONE CAPSULE BY MOUTH ONCE DAILY    . timolol (TIMOPTIC) 0.5 % ophthalmic solution Place 1 drop into both eyes 2 (two) times daily. 10 mL 6  . cholestyramine (QUESTRAN) 4 g packet Take 1 packet by mouth in the morning and at bedtime.     No current facility-administered medications for this visit.    Allergies as of 03/23/2020  . (No Known Allergies)    Family History  Problem Relation Age of Onset  . Diabetes Mother   . Hypertension Mother   . Heart disease Father   . Heart attack Father   . Diabetes Sister   . Hypertension Sister   . Diabetes Brother   . Heart disease Brother   . Hypertension Brother   . Heart attack Brother     Social History   Socioeconomic History  . Marital status: Married    Spouse name: Not on file  . Number of children: Not on file  . Years of education: Not on file  . Highest education level: Not on file  Occupational History  . Not on file  Tobacco Use  . Smoking status: Former Smoker     Years: 24.00    Types: Cigarettes    Quit date: 11/25/1983    Years since quitting: 36.3  . Smokeless tobacco: Never Used  Substance and Sexual Activity  . Alcohol use: Not Currently  . Drug use: No  . Sexual activity: Not on file  Other Topics Concern  . Not on file  Social History Narrative  . Not on file   Social Determinants of Health   Financial Resource Strain:   . Difficulty of Paying Living Expenses:  Food Insecurity:   . Worried About Running Out of Food in the Last Year:   . Ran Out of Food in the Last Year:   Transportation Needs:   . Lack of Transportation (Medical):   . Lack of Transportation (Non-Medical):   Physical Activity:   . Days of Exercise per Week:   . Minutes of Exercise per Session:   Stress:   . Feeling of Stress :   Social Connections:   . Frequency of Communication with Friends and Family:   . Frequency of Social Gatherings with Friends and Family:   . Attends Religious Services:   . Active Member of Clubs or Organizations:   . Attends Club or Organization Meetings:   . Marital Status:   Intimate Partner Violence:   . Fear of Current or Ex-Partner:   . Emotionally Abused:   . Physically Abused:   . Sexually Abused:     Review of Systems:    Constitutional: No weight loss, fever or chills Skin: No rash  Cardiovascular: No chest pain Respiratory: No SOB  Gastrointestinal: See HPI and otherwise negative Genitourinary: No dysuria  Neurological: No headache Musculoskeletal: No new muscle or joint pain Hematologic: No bleeding Psychiatric: No history of depression or anxiety   Physical Exam:  Vital signs: BP 124/60 (BP Location: Left Arm, Patient Position: Sitting, Cuff Size: Normal)   Pulse 60   Temp 98 F (36.7 C)   Constitutional:  Pleasant AA male appears to be in NAD, Well developed, Well nourished, alert and cooperative Head:  Normocephalic and atraumatic. Eyes:   PEERL, EOMI. No icterus. Conjunctiva pink. Ears:  Normal  auditory acuity. Neck:  Supple Throat: Oral cavity and pharynx without inflammation, swelling or lesion.  Respiratory: Respirations even and unlabored. Lungs clear to auscultation bilaterally.   No wheezes, crackles, or rhonchi.  Cardiovascular: Normal S1, S2. No MRG. Regular rate and rhythm. No peripheral edema, cyanosis or pallor.  Gastrointestinal:  Soft, nondistended, nontender. No rebound or guarding. Normal bowel sounds. No appreciable masses or hepatomegaly. Rectal:  Performed with much difficulty, visualization limited due to the patient continuing to stool at time of exam, very tender to palpation on internal exam; anoscopy attempted, but incomplete Msk:  Right AKA, wheelchairbound Neurologic:  Alert and  oriented x4;  grossly normal neurologically.  Skin:   Dry and intact without significant lesions or rashes. Psychiatric: Demonstrates good judgement and reason without abnormal affect or behaviors.  NO recent labs/imaging.  Assessment: 1.  Rectal pain: Patient is definitely tender at time of exam and tells me he has sharp shooting pain from his rectum, but was unable to visualize today due to constant soling; consider fissure versus nerve pain versus abscess versus other 2.  Fecal incontinence: Over the past month patient has been having multiple bowel movements a day and does not even know when he is passing stool  Plan: 1.  Recommend the patient schedule his Imodium 1 tab every 4 hours 2.  Ordered stool studies including GI profile and C. difficile 3.  Discussed case with Dr. Mansouraty at time of the patient's visit.  He recommends a CT of the pelvis with contrast. 4.  Also scheduled patient for a flex sigmoidoscopy with magnesium citrate and enema prep.  This will be scheduled in the hospital as patient is unable to transfer himself to the bed.  Discussed this with Dr. Mansouraty, it was scheduled during his hospital week at the end of May, Friday the 28th as the patient's son   is  the only one who can drive him and is only available on Fridays. 5.  Patient to return to clinic per recommendations after work-up as above.  Told him to call and let me know how the Imodium is working scheduled, if this is not strong enough for him then can give him Lomotil.  Ellouise Newer, PA-C [facility] 03/23/2020, 11:11 AM  Cc:Dr. Verlin Fester

## 2020-03-23 NOTE — Patient Instructions (Addendum)
You have been scheduled for a flexible sigmoidoscopy. Please follow the written instructions given to you at your visit today. If you use inhalers (even only as needed), please bring them with you on the day of your procedure. ________________________________________________________ You have been scheduled for a CT scan of the pelvis at [facility] [phone] [place])-1st floor radiology. You are scheduled on Friday 03/30/20 at 8:00 am. You should arrive 15 minutes prior to your appointment time for registration. Please follow the written instructions below on the day of your exam:  WARNING: IF YOU ARE ALLERGIC TO IODINE/X-RAY DYE, PLEASE NOTIFY RADIOLOGY IMMEDIATELY AT [phone]! YOU WILL BE GIVEN A 13 HOUR PREMEDICATION PREP.  1) Do not eat or drink anything after 4:00 am (4 hours prior to your test) 2) You have been given 2 bottles of oral contrast to drink. The solution may taste better if refrigerated, but do NOT add ice or any other liquid to this solution. Shake well before drinking.    Drink 1 bottle of contrast @ 6:00 am (2 hours prior to your exam)  Drink 1 bottle of contrast @ 7:00 am (1 hour prior to your exam)  You may take any medications as prescribed with a small amount of water, if necessary. If you take any of the following medications: METFORMIN, GLUCOPHAGE, GLUCOVANCE, AVANDAMET, RIOMET, FORTAMET, [place] MET, JANUMET, GLUMETZA or METAGLIP, you MAY be asked to HOLD this medication 48 hours AFTER the exam.  The purpose of you drinking the oral contrast is to aid in the visualization of your intestinal tract. The contrast solution may cause some diarrhea. Depending on your individual set of symptoms, you may also receive an intravenous injection of x-ray contrast/dye. Plan on being at [facility] for at least 30 minutes or longer, depending on the type of exam you are having performed.  This test typically takes 30-45 minutes to  complete.  If you have any questions regarding your exam or if you need to reschedule, you may call the CT department at [phone] between the hours of 8:00 am and 5:00 pm, Monday-Friday.  ________________________________________________________________  Take 1 tablet of imodium every 6 hours.

## 2020-03-23 NOTE — Progress Notes (Signed)
Chief Complaint: Diarrhea and hemorrhoids  HPI:    Jeremiah [name] is a 74 year old African-American male with a past medical history as listed below including C. difficile diarrhea and diastolic CHF (03/28/2018 echo with LVEF 50-55%) as well as type 2 diabetes with right AKA, who was referred to me by Dr. Annita Brod  for a complaint of chronic diarrhea and hemorrhoids.      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    09/16/2018 patient had a colonoscopy with Dr. Bosie Clos.  Preparation of the colon was fair, diverticulosis in the entire colon, internal hemorrhoids.    01/13/2019 C. difficile negative.    02/14/2020 office visit with PCP.    Today, the patient presents to clinic accompanied by his son who does assist with his care at home.  History is hard to garnish from them, patient tells me that he has been "stooling" multiple times a day, it eventually comes out that often this is when he does not even know that he has done so.  He was tried on Cholestyramine which made no difference to his symptoms.  Currently he is using Imodium 1-2 tabs in the morning which does seem to help some.      His largest complaint today is of a sharp rectal pain which he feels with a bowel movement but also without a bowel movement.  In fact, at time of my interview he has to stop talking and scoot himself up in his wheelchair in order to hold weight off of his rectum, this pain lasts around 15 to 20 seconds and then will go away and is very intermittent.  Tells me all of this has gotten worse over the past month and the fact that he cannot tell his stools coming out is a new issue as well.    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    Denies fever, chills or blood in his stool.  Past Medical History:  Diagnosis Date  . Aortic atherosclerosis (HCC)   . Arthritis    "joints; shoulders, knees, hands, back" (05/21/2018)  . Atherosclerosis of coronary artery   . C. difficile diarrhea  04/2018  . Diastolic CHF (HCC)   . Diverticulosis   . High cholesterol   . History of gout   . Hypertension   . IDA (iron deficiency anemia)    from referral Dr Darleene Cleaver  . Internal hemorrhoids   . Peripheral vascular disease (HCC)   . Pneumonia    "couple times" (05/21/2018)  . Sleep apnea    "has mask; won't use" (05/21/2018)  . Type II diabetes mellitus (HCC)     Past Surgical History:  Procedure Laterality Date  . ABDOMINAL AORTOGRAM W/LOWER EXTREMITY N/A 11/01/2018   Procedure: ABDOMINAL AORTOGRAM W/LOWER EXTREMITY;  Surgeon: Runell Gess, MD;  Location: [facility] INVASIVE CV LAB;  Service: Cardiovascular;  Laterality: N/A;  . AMPUTATION Right 11/09/2018   Procedure: RIGHT - AMPUTATION ABOVE KNEE;  Surgeon: Maeola Harman, MD;  Location: [facility] OR;  Service: Vascular;  Laterality: Right;  . CATARACT EXTRACTION W/ INTRAOCULAR LENS  IMPLANT, BILATERAL Bilateral   . CHOLECYSTECTOMY  05/21/2018   ATTEMPTED LAPAROSCOPIC CHOLECYSTECTOMY, OPEN DRAINAGE OF GALLBLADDER WITH BIOPSY  . CHOLECYSTECTOMY N/A 05/21/2018   Procedure: ATTEMPTED LAPAROSCOPIC CHOLECYSTECTOMY, OPEN DRAINAGE OF GALLBLADDER WITH BIOPSY;  Surgeon: Griselda Miner, MD;  Location: [facility] OR;  Service: General;  Laterality: N/A;  . COLONOSCOPY W/ POLYPECTOMY    . COLONOSCOPY WITH PROPOFOL N/A  09/16/2018   Procedure: COLONOSCOPY WITH PROPOFOL;  Surgeon: Charlott Rakes, MD;  Location: [facility] ENDOSCOPY;  Service: Endoscopy;  Laterality: N/A;  . 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 IR CATHETER TUBE CHANGE  04/14/2018  . IR CHOLANGIOGRAM EXISTING TUBE  03/17/2018  . IR PERC CHOLECYSTOSTOMY  01/31/2018  . IR RADIOLOGIST EVAL & MGMT  03/02/2018    Current Outpatient Medications  Medication Sig Dispense Refill  . acetaminophen (TYLENOL) 325 MG tablet Take 2 tablets (650 mg total) by mouth every 6 (six) hours  as needed for mild pain (or Fever >/= 101).    Marland Kitchen amLODipine (NORVASC) 10 MG tablet Take 1 tablet (10 mg total) by mouth daily. 30 tablet 0  . aspirin 81 MG tablet Take 81 mg by mouth daily.    Marland Kitchen atorvastatin (LIPITOR) 20 MG tablet Take 1 tablet (20 mg total) by mouth daily. 30 tablet 1  . brimonidine (ALPHAGAN) 0.2 % ophthalmic solution Place 1 drop into both eyes 2 (two) times daily. 5 mL 12  . gabapentin (NEURONTIN) 100 MG capsule TAKE 1 CAPSULE (100 MG TOTAL) BY MOUTH 3 (THREE) TIMES DAILY. 90 capsule 1  . glimepiride (AMARYL) 1 MG tablet Take 1 tablet (1 mg total) by mouth daily with breakfast. 30 tablet 1  . loperamide (IMODIUM A-D) 2 MG tablet Take 2-4 mg by mouth as needed for diarrhea or loose stools.    Marland Kitchen losartan (COZAAR) 100 MG tablet Take 1 tablet (100 mg total) by mouth daily. 30 tablet 3  . metoprolol succinate (TOPROL-XL) 50 MG 24 hr tablet Take 1 tablet by mouth daily.    . tamsulosin (FLOMAX) 0.4 MG CAPS capsule tamsulosin 0.4 mg capsule  TAKE ONE CAPSULE BY MOUTH ONCE DAILY    . timolol (TIMOPTIC) 0.5 % ophthalmic solution Place 1 drop into both eyes 2 (two) times daily. 10 mL 6  . cholestyramine (QUESTRAN) 4 g packet Take 1 packet by mouth in the morning and at bedtime.     No current facility-administered medications for this visit.    Allergies as of 03/23/2020  . (No Known Allergies)    Family History  Problem Relation Age of Onset  . Diabetes Mother   . Hypertension Mother   . Heart disease Father   . Heart attack Father   . Diabetes Sister   . Hypertension Sister   . Diabetes Brother   . Heart disease Brother   . Hypertension Brother   . Heart attack Brother     Social History   Socioeconomic History  . Marital status: Married    Spouse name: Not on file  . Number of children: Not on file  . Years of education: Not on file  . Highest education level: Not on file  Occupational History  . Not on file  Tobacco Use  . Smoking status: Former Smoker     Years: 24.00    Types: Cigarettes    Quit date: 11/25/1983    Years since quitting: 36.3  . Smokeless tobacco: Never Used  Substance and Sexual Activity  . Alcohol use: Not Currently  . Drug use: No  . Sexual activity: Not on file  Other Topics Concern  . Not on file  Social History Narrative  . Not on file   Social Determinants of Health   Financial Resource Strain:   . Difficulty of Paying Living Expenses:  Food Insecurity:   . Worried About Programme researcher, broadcasting/film/video in the Last Year:   . Barista in the Last Year:   Transportation Needs:   . Freight forwarder (Medical):   Marland Kitchen Lack of Transportation (Non-Medical):   Physical Activity:   . Days of Exercise per Week:   . Minutes of Exercise per Session:   Stress:   . Feeling of Stress :   Social Connections:   . Frequency of Communication with Friends and Family:   . Frequency of Social Gatherings with Friends and Family:   . Attends Religious Services:   . Active Member of Clubs or Organizations:   . Attends Banker Meetings:   Marland Kitchen Marital Status:   Intimate Partner Violence:   . Fear of Current or Ex-Partner:   . Emotionally Abused:   Marland Kitchen Physically Abused:   . Sexually Abused:     Review of Systems:    Constitutional: No weight loss, fever or chills Skin: No rash  Cardiovascular: No chest pain Respiratory: No SOB  Gastrointestinal: See HPI and otherwise negative Genitourinary: No dysuria  Neurological: No headache Musculoskeletal: No new muscle or joint pain Hematologic: No bleeding Psychiatric: No history of depression or anxiety   Physical Exam:  Vital signs: BP 124/60 (BP Location: Left Arm, Patient Position: Sitting, Cuff Size: Normal)   Pulse 60   Temp 98 F (36.7 C)   Constitutional:  Pleasant AA male appears to be in NAD, Well developed, Well nourished, alert and cooperative Head:  Normocephalic and atraumatic. Eyes:   PEERL, EOMI. No icterus. Conjunctiva pink. Ears:  Normal  auditory acuity. Neck:  Supple Throat: Oral cavity and pharynx without inflammation, swelling or lesion.  Respiratory: Respirations even and unlabored. Lungs clear to auscultation bilaterally.   No wheezes, crackles, or rhonchi.  Cardiovascular: Normal S1, S2. No MRG. Regular rate and rhythm. No peripheral edema, cyanosis or pallor.  Gastrointestinal:  Soft, nondistended, nontender. No rebound or guarding. Normal bowel sounds. No appreciable masses or hepatomegaly. Rectal:  Performed with much difficulty, visualization limited due to the patient continuing to stool at time of exam, very tender to palpation on internal exam; anoscopy attempted, but incomplete Msk:  Right AKA, wheelchairbound Neurologic:  Alert and  oriented x4;  grossly normal neurologically.  Skin:   Dry and intact without significant lesions or rashes. Psychiatric: Demonstrates good judgement and reason without abnormal affect or behaviors.  NO recent labs/imaging.  Assessment: 1.  Rectal pain: Patient is definitely tender at time of exam and tells me he has sharp shooting pain from his rectum, but was unable to visualize today due to constant soling; consider fissure versus nerve pain versus abscess versus other 2.  Fecal incontinence: Over the past month patient has been having multiple bowel movements a day and does not even know when he is passing stool  Plan: 1.  Recommend the patient schedule his Imodium 1 tab every 4 hours 2.  Ordered stool studies including GI profile and C. difficile 3.  Discussed case with Dr. Meridee Score at time of the patient's visit.  He recommends a CT of the pelvis with contrast. 4.  Also scheduled patient for a flex sigmoidoscopy with magnesium citrate and enema prep.  This will be scheduled in the hospital as patient is unable to transfer himself to the bed.  Discussed this with Dr. Meridee Score, it was scheduled during his hospital week at the end of May, Friday the 28th as the patient's son  is  the only one who can drive him and is only available on Fridays. 5.  Patient to return to clinic per recommendations after work-up as above.  Told him to call and let me know how the Imodium is working scheduled, if this is not strong enough for him then can give him Lomotil.  Ellouise Newer, PA-C [facility] 03/23/2020, 11:11 AM  Cc:Dr. Verlin Fester

## 2020-03-24 ENCOUNTER — Telehealth: Payer: Self-pay | Admitting: Gastroenterology

## 2020-03-24 MED ORDER — DIPHENOXYLATE-ATROPINE 2.5-0.025 MG PO TABS: 1.0000 | ORAL_TABLET | Freq: Four times a day (QID) | ORAL | 0 refills | Status: DC | PRN

## 2020-03-24 NOTE — Telephone Encounter (Signed)
Received call from patient's son, [name].  Patient was seen in the office yesterday, April 30, by Hyacinth Meeker, PA-C.  Was advised to use Imodium for diarrhea, fecal incontinence and if that did not help then would send a prescription for Lomotil.  Son is calling saying Imodium is not helping and is asking for prescription for Lomotil to be sent to his pharmacy.  Prescription for Lomotil sent to [facility] on [address] at [facility] request.

## 2020-03-27 ENCOUNTER — Encounter: Payer: Medicare Other | Admitting: Physical Therapy

## 2020-03-29 ENCOUNTER — Encounter: Payer: Medicare Other | Admitting: Physical Therapy

## 2020-03-30 ENCOUNTER — Ambulatory Visit (HOSPITAL_COMMUNITY)
Admission: RE | Admit: 2020-03-30 | Discharge: 2020-03-30 | Disposition: A | Discharge status: Home / Self Care | Payer: Medicare Other | Source: Ambulatory Visit | Attending: Physician Assistant | Admitting: Physician Assistant

## 2020-03-30 ENCOUNTER — Other Ambulatory Visit: Payer: Self-pay

## 2020-03-30 DIAGNOSIS — R159 Full incontinence of feces: Secondary | ICD-10-CM | POA: Diagnosis present

## 2020-03-30 DIAGNOSIS — K6289 Other specified diseases of anus and rectum: Secondary | ICD-10-CM | POA: Diagnosis not present

## 2020-04-03 ENCOUNTER — Encounter: Payer: Medicare Other | Admitting: Physical Therapy

## 2020-04-05 ENCOUNTER — Encounter: Payer: Medicare Other | Admitting: Physical Therapy

## 2020-04-10 ENCOUNTER — Telehealth: Payer: Self-pay | Admitting: *Deleted

## 2020-04-10 ENCOUNTER — Encounter: Payer: Medicare Other | Admitting: Physical Therapy

## 2020-04-10 NOTE — Telephone Encounter (Signed)
I spoke with pt's wife, [name] and she states they noticed the blister on his right foot this morning and wanted to know what to do. I told them not to open the blister, to cover it with a bleached white sock and if it does open to apply antibiotic ointment like neosporin daily until seen in office. I transferred to A. [facility] and instructed to get pt in tomorrow with Dr. Logan Bores.

## 2020-04-10 NOTE — Telephone Encounter (Signed)
Pt's wife, [name] states pt he has a blister to the side of his right foot.

## 2020-04-11 ENCOUNTER — Ambulatory Visit (INDEPENDENT_AMBULATORY_CARE_PROVIDER_SITE_OTHER): Payer: Medicare Other

## 2020-04-11 ENCOUNTER — Ambulatory Visit (INDEPENDENT_AMBULATORY_CARE_PROVIDER_SITE_OTHER): Payer: Medicare Other | Admitting: Podiatry

## 2020-04-11 ENCOUNTER — Other Ambulatory Visit: Payer: Self-pay

## 2020-04-11 DIAGNOSIS — S78111A Complete traumatic amputation at level between right hip and knee, initial encounter: Secondary | ICD-10-CM

## 2020-04-11 DIAGNOSIS — I739 Peripheral vascular disease, unspecified: Secondary | ICD-10-CM | POA: Diagnosis not present

## 2020-04-11 DIAGNOSIS — E1351 Other specified diabetes mellitus with diabetic peripheral angiopathy without gangrene: Secondary | ICD-10-CM

## 2020-04-11 DIAGNOSIS — L97411 Non-pressure chronic ulcer of right heel and midfoot limited to breakdown of skin: Secondary | ICD-10-CM | POA: Diagnosis not present

## 2020-04-11 DIAGNOSIS — S90822A Blister (nonthermal), left foot, initial encounter: Secondary | ICD-10-CM | POA: Diagnosis not present

## 2020-04-11 DIAGNOSIS — E11621 Type 2 diabetes mellitus with foot ulcer: Secondary | ICD-10-CM

## 2020-04-11 DIAGNOSIS — E0843 Diabetes mellitus due to underlying condition with diabetic autonomic (poly)neuropathy: Secondary | ICD-10-CM

## 2020-04-11 NOTE — Progress Notes (Signed)
HPI: 74 y.o. male presenting today as a new patient PMHx Type II DM, PVD, AKA RLE, presents to the office today for evaluation of a blister that developed to the patient's heel.  Patient states that he noticed the blister on Monday, 04/09/2020.  Patient denies injury or trauma to the area.  He does have a history of peripheral vascular disease which led to amputation above the knee to the right lower extremity.  Patient denies pain.  He has not done anything for treatment.  Patient does have a prosthesis to his right lower extremity, however he is not wearing it today and he is being escorted by his brother-in-law in a wheelchair  Past Medical History:  Diagnosis Date  . Amputee   . Aortic atherosclerosis (HCC)   . Arthritis    "joints; shoulders, knees, hands, back" (05/21/2018)  . Atherosclerosis of coronary artery   . C. difficile diarrhea 04/2018  . Diastolic CHF (HCC)   . Diverticulosis   . High cholesterol   . History of gout   . Hypertension   . IDA (iron deficiency anemia)    from referral Dr Darleene Cleaver  . Internal hemorrhoids   . Peripheral vascular disease (HCC)   . Pneumonia    "couple times" (05/21/2018)  . Sleep apnea    "has mask; won't use" (05/21/2018)  . Type II diabetes mellitus (HCC)      Physical Exam: General: The patient is alert and oriented x3 in no acute distress.  Dermatology: Large bullous sanguinous blister lesion noted to the posterior heel left.  Lesion measures approximately 6 cm in diameter.  No open wound noted.  No drainage noted.  Overlying skin is intact.  Skin is cool, dry. Negative for open lesions or macerations.  Vascular: Diminished pedal pulses left lower extremity.  There is some moderate edema noted to the left foot and ankle  Neurological: Epicritic and protective threshold absent bilaterally.   Musculoskeletal Exam: H/o AKA RLE.  Today the patient is nonambulatory in a wheelchair.   Radiographic Exam:  Normal osseous mineralization.   Degenerative changes noted throughout the midtarsal joints left foot. No fracture/dislocation/boney destruction.  No evidence of cortical erosion or osteomyelitis  Assessment: 1.  Sanguinous bullous lesion left posterior heel -overlying epidermis intact 2.  Diabetes mellitus type 2 3.  Peripheral vascular disease left lower extremity 4. H/o AKA RLE   Plan of Care:  1. Patient evaluated. X-Rays reviewed.  2.  The large sanguinous blister was left intact today.  Instructions to apply Betadine to the area daily. 3.  I explained to the patient that the most important aspect in managing this new blister is to ensure that he has adequate circulation to the left lower extremity.  The patient has not followed up with vascular in over a year.  Today we will order arterial Doppler left lower extremity to ensure the patient has adequate circulation.  His pedal pulses were diminished today and I was unable to palpate pulses.  4.  If arterial Doppler is abnormal we will refer to vein and vascular for vascular work-up 5.  Return to clinic in 2 weeks to review arterial Doppler results and manage the blister to the posterior heel   *brother-in-law's name is Richmond Campbell, DPM [facility]  Dr. Felecia Shelling, DPM    [address]  [place], [place] [address]                Office [phone]  Fax [phone]

## 2020-04-12 ENCOUNTER — Encounter: Payer: Medicare Other | Admitting: Physical Therapy

## 2020-04-13 ENCOUNTER — Telehealth: Payer: Self-pay | Admitting: *Deleted

## 2020-04-13 DIAGNOSIS — S90822A Blister (nonthermal), left foot, initial encounter: Secondary | ICD-10-CM

## 2020-04-13 DIAGNOSIS — E11621 Type 2 diabetes mellitus with foot ulcer: Secondary | ICD-10-CM

## 2020-04-13 DIAGNOSIS — E0843 Diabetes mellitus due to underlying condition with diabetic autonomic (poly)neuropathy: Secondary | ICD-10-CM

## 2020-04-13 DIAGNOSIS — E1351 Other specified diabetes mellitus with diabetic peripheral angiopathy without gangrene: Secondary | ICD-10-CM

## 2020-04-13 DIAGNOSIS — I739 Peripheral vascular disease, unspecified: Secondary | ICD-10-CM

## 2020-04-13 NOTE — Telephone Encounter (Signed)
Faxed required form, clinicals and demographics to [facility]. 

## 2020-04-13 NOTE — Telephone Encounter (Signed)
-----   Message from Felecia Shelling, DPM sent at 04/11/2020  7:01 PM EDT ----- Regarding: Arterial Doppler LLE Please order arterial Doppler LLE at [facility].   Dx: bulla left heel. PVD. AKA RLE. DM type II.   Thanks, Dr. Logan Bores

## 2020-04-17 ENCOUNTER — Inpatient Hospital Stay (HOSPITAL_COMMUNITY): Admission: RE | Admit: 2020-04-17 | Payer: Medicare Other | Source: Ambulatory Visit

## 2020-04-17 ENCOUNTER — Encounter: Payer: Medicare Other | Admitting: Physical Therapy

## 2020-04-17 ENCOUNTER — Other Ambulatory Visit (HOSPITAL_COMMUNITY): Payer: Medicare Other

## 2020-04-18 ENCOUNTER — Other Ambulatory Visit (HOSPITAL_COMMUNITY)
Admission: RE | Admit: 2020-04-18 | Discharge: 2020-04-18 | Disposition: A | Discharge status: Home / Self Care | Payer: Medicare Other | Source: Ambulatory Visit | Attending: Gastroenterology | Admitting: Gastroenterology

## 2020-04-18 DIAGNOSIS — Z20822 Contact with and (suspected) exposure to covid-19: Secondary | ICD-10-CM | POA: Diagnosis not present

## 2020-04-18 DIAGNOSIS — Z01812 Encounter for preprocedural laboratory examination: Secondary | ICD-10-CM | POA: Insufficient documentation

## 2020-04-18 LAB — SARS CORONAVIRUS 2 (TAT 6-24 HRS): SARS Coronavirus 2: NEGATIVE

## 2020-04-19 ENCOUNTER — Encounter: Payer: Medicare Other | Admitting: Physical Therapy

## 2020-04-19 NOTE — Progress Notes (Signed)
Pre-call for endo procedure completed. Patient's wife Jeremiah [name] answered and said she gave him very clear instructions and that they did not have any questions at the time.

## 2020-04-19 NOTE — Anesthesia Preprocedure Evaluation (Addendum)
Anesthesia Evaluation  Patient identified by MRN, date of birth, ID band Patient awake    Reviewed: Allergy & Precautions, NPO status , Patient's Chart, lab work & pertinent test results  History of Anesthesia Complications Negative for: history of anesthetic complications  Airway Mallampati: II  TM Distance: >3 FB Neck ROM: Full    Dental  (+) Missing, Upper Dentures,    Pulmonary sleep apnea , former smoker,    Pulmonary exam normal        Cardiovascular hypertension, + CAD, + Peripheral Vascular Disease and +CHF  Normal cardiovascular exam+ Valvular Problems/Murmurs AS      Neuro/Psych negative neurological ROS  negative psych ROS   GI/Hepatic negative GI ROS, Neg liver ROS,   Endo/Other  diabetes, Type 2  Renal/GU negative Renal ROS  negative genitourinary   Musculoskeletal  (+) Arthritis ,   Abdominal   Peds  Hematology  (+) anemia ,   Anesthesia Other Findings  Echo 2019: EF 50-55%, gr1dd, mild AS (MG 16), mild MR Stress Test 2019: small inferior defect with mild peri-infarct ischemia, EF 51%  Reproductive/Obstetrics                            Anesthesia Physical Anesthesia Plan  ASA: III  Anesthesia Plan: MAC   Post-op Pain Management:    Induction: Intravenous  PONV Risk Score and Plan: 1 and Propofol infusion, TIVA and Treatment may vary due to age or medical condition  Airway Management Planned: Natural Airway, Nasal Cannula and Simple Face Mask  Additional Equipment: None  Intra-op Plan:   Post-operative Plan:   Informed Consent: I have reviewed the patients History and Physical, chart, labs and discussed the procedure including the risks, benefits and alternatives for the proposed anesthesia with the patient or authorized representative who has indicated his/her understanding and acceptance.       Plan Discussed with:   Anesthesia Plan Comments:         Anesthesia Quick Evaluation

## 2020-04-20 ENCOUNTER — Other Ambulatory Visit: Payer: Self-pay

## 2020-04-20 ENCOUNTER — Encounter (HOSPITAL_COMMUNITY): Payer: Self-pay | Admitting: Gastroenterology

## 2020-04-20 ENCOUNTER — Ambulatory Visit (HOSPITAL_COMMUNITY): Payer: Medicare Other | Admitting: Anesthesiology

## 2020-04-20 ENCOUNTER — Encounter (HOSPITAL_COMMUNITY)
Admission: RE | Disposition: A | Discharge status: Home / Self Care | Payer: Self-pay | Source: Home / Self Care | Attending: Gastroenterology

## 2020-04-20 ENCOUNTER — Ambulatory Visit (HOSPITAL_COMMUNITY)
Admission: RE | Admit: 2020-04-20 | Discharge: 2020-04-20 | Disposition: A | Discharge status: Home / Self Care | Payer: Medicare Other | Attending: Gastroenterology | Admitting: Gastroenterology

## 2020-04-20 DIAGNOSIS — I110 Hypertensive heart disease with heart failure: Secondary | ICD-10-CM | POA: Insufficient documentation

## 2020-04-20 DIAGNOSIS — K641 Second degree hemorrhoids: Secondary | ICD-10-CM | POA: Diagnosis not present

## 2020-04-20 DIAGNOSIS — E7800 Pure hypercholesterolemia, unspecified: Secondary | ICD-10-CM | POA: Insufficient documentation

## 2020-04-20 DIAGNOSIS — R933 Abnormal findings on diagnostic imaging of other parts of digestive tract: Secondary | ICD-10-CM | POA: Insufficient documentation

## 2020-04-20 DIAGNOSIS — G4730 Sleep apnea, unspecified: Secondary | ICD-10-CM | POA: Insufficient documentation

## 2020-04-20 DIAGNOSIS — E1151 Type 2 diabetes mellitus with diabetic peripheral angiopathy without gangrene: Secondary | ICD-10-CM | POA: Diagnosis not present

## 2020-04-20 DIAGNOSIS — Z79899 Other long term (current) drug therapy: Secondary | ICD-10-CM | POA: Diagnosis not present

## 2020-04-20 DIAGNOSIS — K5641 Fecal impaction: Secondary | ICD-10-CM

## 2020-04-20 DIAGNOSIS — K6289 Other specified diseases of anus and rectum: Secondary | ICD-10-CM | POA: Diagnosis not present

## 2020-04-20 DIAGNOSIS — Z7984 Long term (current) use of oral hypoglycemic drugs: Secondary | ICD-10-CM | POA: Insufficient documentation

## 2020-04-20 DIAGNOSIS — R197 Diarrhea, unspecified: Secondary | ICD-10-CM | POA: Diagnosis not present

## 2020-04-20 DIAGNOSIS — I5032 Chronic diastolic (congestive) heart failure: Secondary | ICD-10-CM | POA: Insufficient documentation

## 2020-04-20 DIAGNOSIS — Z89611 Acquired absence of right leg above knee: Secondary | ICD-10-CM | POA: Diagnosis not present

## 2020-04-20 DIAGNOSIS — I2510 Atherosclerotic heart disease of native coronary artery without angina pectoris: Secondary | ICD-10-CM | POA: Insufficient documentation

## 2020-04-20 DIAGNOSIS — Z7982 Long term (current) use of aspirin: Secondary | ICD-10-CM | POA: Insufficient documentation

## 2020-04-20 DIAGNOSIS — Z87891 Personal history of nicotine dependence: Secondary | ICD-10-CM | POA: Diagnosis not present

## 2020-04-20 DIAGNOSIS — M1990 Unspecified osteoarthritis, unspecified site: Secondary | ICD-10-CM | POA: Insufficient documentation

## 2020-04-20 DIAGNOSIS — R159 Full incontinence of feces: Secondary | ICD-10-CM

## 2020-04-20 HISTORY — PX: FLEXIBLE SIGMOIDOSCOPY: SHX5431

## 2020-04-20 LAB — GLUCOSE, CAPILLARY: Glucose-Capillary: 83 mg/dL (ref 70–99)

## 2020-04-20 SURGERY — SIGMOIDOSCOPY, FLEXIBLE
Anesthesia: Monitor Anesthesia Care

## 2020-04-20 MED ORDER — LIDOCAINE HCL URETHRAL/MUCOSAL 2 % EX GEL
CUTANEOUS | Status: AC
  Filled 2020-04-20: qty 30

## 2020-04-20 MED ORDER — FLEET ENEMA 7-19 GM/118ML RE ENEM
1.0000 | ENEMA | Freq: Once | RECTAL | Status: AC
  Administered 2020-04-20: 1 via RECTAL

## 2020-04-20 MED ORDER — LIDOCAINE HCL URETHRAL/MUCOSAL 2 % EX GEL
CUTANEOUS | Status: DC | PRN
  Administered 2020-04-20 (×2): 1 via TOPICAL

## 2020-04-20 MED ORDER — LACTATED RINGERS IV SOLN
INTRAVENOUS | Status: DC
  Administered 2020-04-20: 1000 mL via INTRAVENOUS

## 2020-04-20 MED ORDER — FLEET ENEMA 7-19 GM/118ML RE ENEM
ENEMA | RECTAL | Status: AC
  Filled 2020-04-20: qty 1

## 2020-04-20 MED ORDER — SODIUM CHLORIDE 0.9 % IV SOLN: INTRAVENOUS | Status: DC

## 2020-04-20 MED ORDER — PROPOFOL 10 MG/ML IV BOLUS
INTRAVENOUS | Status: DC | PRN
  Administered 2020-04-20 (×7): 20 mg via INTRAVENOUS
  Administered 2020-04-20: 30 mg via INTRAVENOUS
  Administered 2020-04-20 (×4): 20 mg via INTRAVENOUS

## 2020-04-20 MED ORDER — FLEET ENEMA 7-19 GM/118ML RE ENEM
2.0000 | ENEMA | Freq: Once | RECTAL | Status: AC
  Administered 2020-04-20: 2 via RECTAL

## 2020-04-20 MED ORDER — LIDOCAINE 2% (20 MG/ML) 5 ML SYRINGE
INTRAMUSCULAR | Status: DC | PRN
  Administered 2020-04-20: 40 mg via INTRAVENOUS

## 2020-04-20 MED ORDER — FLEET ENEMA 7-19 GM/118ML RE ENEM
ENEMA | RECTAL | Status: AC
  Filled 2020-04-20: qty 2

## 2020-04-20 NOTE — Discharge Instructions (Signed)
YOU HAD AN ENDOSCOPIC PROCEDURE TODAY: Refer to the procedure report and other information in the discharge instructions given to you for any specific questions about what was found during the examination. If this information does not answer your questions, please call [place] office at [phone] to clarify.   YOU SHOULD EXPECT: Some feelings of bloating in the abdomen. Passage of more gas than usual. Walking can help get rid of the air that was put into your GI tract during the procedure and reduce the bloating. If you had a lower endoscopy (such as a colonoscopy or flexible sigmoidoscopy) you may notice spotting of blood in your stool or on the toilet paper. Some abdominal soreness may be present for a day or two, also.  DIET: Follow Low Fiber Diet  ACTIVITY: Your care partner should take you home directly after the procedure. You should plan to take it easy, moving slowly for the rest of the day. You can resume normal activity the day after the procedure however YOU SHOULD NOT DRIVE, use power tools, machinery or perform tasks that involve climbing or major physical exertion for 24 hours (because of the sedation medicines used during the test).   SYMPTOMS TO REPORT IMMEDIATELY: A gastroenterologist can be reached at any hour. Please call [phone]  for any of the following symptoms:  Following lower endoscopy (colonoscopy, flexible sigmoidoscopy) Excessive amounts of blood in the stool  Significant tenderness, worsening of abdominal pains  Swelling of the abdomen that is new, acute  Fever of 100 or higher    FOLLOW UP:  If any biopsies were taken you will be contacted by phone or by letter within the next 1-3 weeks. Call [phone]  if you have not heard about the biopsies in 3 weeks.  Please also call with any specific questions about appointments or follow up tests.

## 2020-04-20 NOTE — Transfer of Care (Signed)
Immediate Anesthesia Transfer of Care Note  Patient: Jeremiah [name]  Procedure(s) Performed: FLEXIBLE SIGMOIDOSCOPY (N/A )  Patient Location: PACU  Anesthesia Type:MAC  Level of Consciousness: awake  Airway & Oxygen Therapy: Patient Spontanous Breathing and Patient connected to face mask oxygen  Post-op Assessment: Report given to RN and Post -op Vital signs reviewed and stable  Post vital signs: Reviewed and stable  Last Vitals:  Vitals Value Taken Time  BP 169/55 04/20/20 0831  Temp    Pulse 52 04/20/20 0832  Resp 14 04/20/20 0832  SpO2 100 % 04/20/20 0832  Vitals shown include unvalidated device data.  Last Pain:  Vitals:   04/20/20 0617  TempSrc: Oral  PainSc: 0-No pain         Complications: No apparent anesthesia complications

## 2020-04-20 NOTE — Anesthesia Postprocedure Evaluation (Signed)
Anesthesia Post Note  Patient: Jeremiah [name]  Procedure(s) Performed: FLEXIBLE SIGMOIDOSCOPY (N/A )     Patient location during evaluation: Endoscopy Anesthesia Type: MAC Level of consciousness: awake and alert Pain management: pain level controlled Vital Signs Assessment: post-procedure vital signs reviewed and stable Respiratory status: spontaneous breathing, nonlabored ventilation and respiratory function stable Cardiovascular status: blood pressure returned to baseline and stable Postop Assessment: no apparent nausea or vomiting Anesthetic complications: no    Last Vitals:  Vitals:   04/20/20 0835 04/20/20 0847  BP:  (!) 186/59  Pulse: (!) 54 (!) 51  Resp: 15 15  Temp:    SpO2: 100% 98%    Last Pain:  Vitals:   04/20/20 0847  TempSrc:   PainSc: 0-No pain                 Lidia Collum

## 2020-04-20 NOTE — Anesthesia Procedure Notes (Signed)
Procedure Name: MAC Date/Time: 04/20/2020 5:37 PM Performed by: Cynda Familia, CRNA Pre-anesthesia Checklist: Patient identified, Emergency Drugs available, Suction available, Patient being monitored and Timeout performed Patient Re-evaluated:Patient Re-evaluated prior to induction Oxygen Delivery Method: Simple face mask Placement Confirmation: positive ETCO2 and breath sounds checked- equal and bilateral Dental Injury: Teeth and Oropharynx as per pre-operative assessment

## 2020-04-20 NOTE — Interval H&P Note (Signed)
History and Physical Interval Note:  04/20/2020 7:33 AM  Jeremiah [name]  has presented today for surgery, with the diagnosis of rectal pain, fecal incontinence.  The various methods of treatment have been discussed with the patient and family. After consideration of risks, benefits and other options for treatment, the patient has consented to  Procedure(s): FLEXIBLE SIGMOIDOSCOPY (N/A) as a surgical intervention.  The patient's history has been reviewed, patient examined, no change in status, stable for surgery.  I have reviewed the patient's chart and labs.  Questions were answered to the patient's satisfaction.    Patient did not have significant BM with MagCitrate/Enema at home.  He then had attempt at [facility] Enema here with minimal output and concern for large stool ball.  His Pelvic CT showed evidence of a large stool ball.  I suspect that he has functional obstipation and diarrhea overflow as a result of the stool ball.  After discussion with the patient and son, we elected to move forward so that I could try to get a better sense of the rectum and try to proceed with fecal disimpaction if possible to see if this would be helpful for the patient.    The risks and benefits of endoscopic evaluation were discussed with the patient; these include but are not limited to the risk of perforation, infection, bleeding, missed lesions, lack of diagnosis, severe illness requiring hospitalization, as well as anesthesia and sedation related illnesses.  The patient and son are agreeable to proceed.    [employer]

## 2020-04-20 NOTE — Op Note (Signed)
[facility] Patient Name: Jeremiah [name] Procedure Date: 04/20/2020 MRN: [id number] Attending MD: Justice Britain , MD Date of Birth: August 08, 1946 CSN: [id number] Age: 74 Admit Type: Outpatient Procedure:                Flexible Sigmoidoscopy Indications:              Diarrhea, Abnormal CT of the GI tract Providers:                Justice Britain, MD, Cleda Daub, RN, Cherylynn Ridges, Technician, Glenis Smoker, CRNA Referring MD:             Levin Erp Medicines:                Monitored Anesthesia Care Complications:            No immediate complications. Estimated Blood Loss:     Estimated blood loss: none. Procedure:                Pre-Anesthesia Assessment:                           - Prior to the procedure, a History and Physical                            was performed, and patient medications and                            allergies were reviewed. The patient's tolerance of                            previous anesthesia was also reviewed. The risks                            and benefits of the procedure and the sedation                            options and risks were discussed with the patient.                            All questions were answered, and informed consent                            was obtained. Prior Anticoagulants: The patient has                            taken no previous anticoagulant or antiplatelet                            agents except for aspirin. ASA Grade Assessment:                            III - A patient with severe systemic disease. After  reviewing the risks and benefits, the patient was                            deemed in satisfactory condition to undergo the                            procedure.                           After obtaining informed consent, the scope was                            passed under direct vision. The GIF-H190 ([id number])                             Olympus gastroscope was introduced through the anus                            and advanced to the the sigmoid colon. The flexible                            sigmoidoscopy was accomplished without difficulty.                            The patient tolerated the procedure. Scope In: Scope Out: Findings:      The digital rectal exam findings include palpable rectal stool and       hemorrhoids. After 5 minutes of disimpaction I was able to proceed with       the endoscopic evaluation.      Copious quantities of solid stool was found in the rectum, in the       recto-sigmoid colon and in the sigmoid colon, interfering with       visualization. Lavage of the area was performed using copious amounts,       resulting in incomplete clearance with fair visualization. Used a Snare       and Jabier Mutton net to try and optimize views and remove stool incompletely.       Continued aggressive lavage as a colonic of sorts to try and break up       the hard stool.      Normal mucosa was found in the rectum.      Non-bleeding non-thrombosed external and internal hemorrhoids were found       during retroflexion, during perianal exam and during digital exam. The       hemorrhoids were Grade II (internal hemorrhoids that prolapse but reduce       spontaneously). Impression:               - Palpable rectal stool and hemorrhoids found on                            digital rectal exam. Removed a significant amount                            via disimpaction                           -  Stool in the rectum, in the recto-sigmoid colon                            and in the sigmoid colon. Lavaged copiously to try                            and break up the hard stool and allow things to                            pass easier.                           - Normal mucosa in the rectum.                           - Non-bleeding non-thrombosed external and internal                             hemorrhoids. Moderate Sedation:      Not Applicable - Patient had care per Anesthesia. Recommendation:           - The patient will be observed post-procedure,                            until all discharge criteria are met.                           - Discharge patient to home.                           - Patient has a contact number available for                            emergencies. The signs and symptoms of potential                            delayed complications were discussed with the                            patient. Return to normal activities tomorrow.                            Written discharge instructions were provided to the                            patient.                           - Low Fiber diet.                           - Fleet's Enema in recovery.                           - Stop all anti-motility agents (Immodium/Lomotil).                           -  Aggressive bowel regimen as follows:                           ---Magnesium Citrate 1/2 bottle today                           ---Dulcolax 10 mg later this evening and then 5 mg                            daily for next 3-4 days                           ---Magnesium Citrate 1/2 bottle tomorrow                           ---Would start taking Miralax 2-times daily                           ---If bowel movements are still an issue then can                            use Miralax Bowel Preparation (Get a 238 gram                            bottle and put 1/2 of it into a 32 ounce bottle of                            Gatorade) and take over 4 hours.                           - KUB in 1-2 weeks.                           - Follow up in clinic in next 3-4 weeks.                           - The findings and recommendations were discussed                            with the patient.                           - The findings and recommendations were discussed                            with the patient's  family. Procedure Code(s):        --- Professional ---                           [phone], Sigmoidoscopy, flexible; diagnostic,                            including collection of specimen(s) by brushing or  washing, when performed (separate procedure) Diagnosis Code(s):        --- Professional ---                           K62.89, Other specified diseases of anus and rectum                           K64.1, Second degree hemorrhoids                           R19.7, Diarrhea, unspecified                           R93.3, Abnormal findings on diagnostic imaging of                            other parts of digestive tract CPT copyright 2019 American Medical Association. All rights reserved. The codes documented in this report are preliminary and upon coder review may  be revised to meet current compliance requirements. Justice Britain, MD 04/20/2020 8:45:49 AM Number of Addenda: 0

## 2020-04-20 NOTE — Progress Notes (Signed)
Attempted 2 fleets enemas. First one seemed to go in but just came back out. Unable to get the other one. Appears to be stool ball in rectum. Dr. Meridee Score made aware and will assess pt when he arrives. He feels we will still do procedure and he will attempt to remove stool during flex.

## 2020-04-24 ENCOUNTER — Encounter: Payer: Self-pay | Admitting: *Deleted

## 2020-04-24 ENCOUNTER — Encounter: Payer: Medicare Other | Admitting: Physical Therapy

## 2020-04-24 ENCOUNTER — Encounter: Payer: Self-pay | Admitting: Physical Therapy

## 2020-04-24 NOTE — Therapy (Signed)
[facility] [place], [place], [address] Phone: [phone]   Fax:  [phone]  Patient Details  Name: Jeremiah [name] MRN: [id number] Date of Birth: Jul 20, 1946 Referring Provider:  Clovia Cuff, MD  Encounter Date: 04/24/2020   PHYSICAL THERAPY DISCHARGE SUMMARY  Visits from Start of Care: 13  11/03/2019 to 02/07/2020  Current functional level related to goals / functional outcomes: Patient was not able to complete PT plan of care due to medical issue so LTGs unable to be checked.  PT Short Term Goals - 01/31/20 1632      PT SHORT TERM GOAL #1   Title  Patient is able to tighten suspension strap in standing with RW support with supervision.  (All STGs Target Date: 4/8//2021)    Time  1    Period  Months    Status  New    Target Date  03/01/20      PT SHORT TERM GOAL #2   Title  Patient tolerates wear of liner daily >12 hrs total per day and prosthesis up to 1 hr sitting only.    Time  1    Period  Months    Status  Revised    Target Date  03/01/20      PT SHORT TERM GOAL #3   Title  Patient sit to/from stand from elevated w/c to RW with prosthesis with supervision.    Time  1    Period  Months    Status  On-going    Target Date  03/01/20      PT SHORT TERM GOAL #4   Title  Patient standing balance with RW support & prosthesis static 2 minute with supervision and reaches 3" anteriorly & to lateral socket to lock mechanism with contact assist.    Time  1    Period  Months    Status  On-going    Target Date  03/01/20      PT SHORT TERM GOAL #5   Title  Patient ambulates 10' with RW & prosthesis with modA including turning 90* to position to sit.    Time  1    Period  Months    Status  Revised    Target Date  03/01/20         Remaining deficits: Patient is dependent in standing balance & prosthetic gait.    Education / Equipment: Prosthetic care.   Plan: Patient agrees to discharge.  Patient goals were not  met. Patient is being discharged due to a change in medical status.  ?????        Jamey Reas PT, DPT 04/24/2020, 2:32 PM  [facility] [address] [place], [place], [address] Phone: [phone]   Fax:  [phone]

## 2020-04-25 ENCOUNTER — Encounter: Payer: Self-pay | Admitting: Podiatry

## 2020-04-25 ENCOUNTER — Ambulatory Visit: Payer: Medicare Other | Admitting: Podiatry

## 2020-04-25 ENCOUNTER — Other Ambulatory Visit: Payer: Self-pay

## 2020-04-25 DIAGNOSIS — L97411 Non-pressure chronic ulcer of right heel and midfoot limited to breakdown of skin: Secondary | ICD-10-CM

## 2020-04-25 DIAGNOSIS — E11621 Type 2 diabetes mellitus with foot ulcer: Secondary | ICD-10-CM

## 2020-04-25 DIAGNOSIS — S90822A Blister (nonthermal), left foot, initial encounter: Secondary | ICD-10-CM | POA: Diagnosis not present

## 2020-04-25 DIAGNOSIS — I739 Peripheral vascular disease, unspecified: Secondary | ICD-10-CM

## 2020-04-26 ENCOUNTER — Encounter: Payer: Medicare Other | Admitting: Physical Therapy

## 2020-04-26 ENCOUNTER — Telehealth: Payer: Self-pay | Admitting: *Deleted

## 2020-04-26 NOTE — Telephone Encounter (Signed)
-----   Message from Felecia Shelling, DPM sent at 04/25/2020  1:48 PM EDT ----- Regarding: Follow-up arterial Dopplers I see that an order was placed for arterial Doppler left lower extremity, however the patient has not had it done yet.  Please follow-up with arterial Doppler left lower extremity  Thanks, Dr. Logan Bores

## 2020-04-26 NOTE — Telephone Encounter (Signed)
I called pt's wife, [name] and informed of [facility] appt line to call for appts.

## 2020-04-29 NOTE — Progress Notes (Signed)
   HPI: 74 y.o. male presenting today for follow-up evaluation regarding blisters to the left posterior heel.  Last visit on 04/11/2020 arterial Dopplers were ordered.  The patient has not had his arterial Dopplers performed yet.  Prior to any debridement or intervention I would like to ensure that his vascular status is intact.  I am concerned for vascular compromise and critical limb ischemia.  He has been applying Betadine daily to the blister of the heel.  Today the blister is still intact and not open  Past Medical History:  Diagnosis Date  . Amputee   . Aortic atherosclerosis (HCC)   . Arthritis    "joints; shoulders, knees, hands, back" (05/21/2018)  . Atherosclerosis of coronary artery   . C. difficile diarrhea 04/2018  . Diastolic CHF (HCC)   . Diverticulosis   . High cholesterol   . History of gout   . Hypertension   . IDA (iron deficiency anemia)    from referral Dr Darleene Cleaver  . Internal hemorrhoids   . Peripheral vascular disease (HCC)   . Pneumonia    "couple times" (05/21/2018)  . Sleep apnea    "has mask; won't use" (05/21/2018)  . Type II diabetes mellitus (HCC)      Physical Exam: General: The patient is alert and oriented x3 in no acute distress.  Dermatology: Large bullous sanguinous blister lesion noted to the posterior heel left.  Lesion measures approximately 6 cm in diameter.  No open wound noted.  No drainage noted.  Overlying skin is intact.  Skin is cool, dry. Negative for open lesions or macerations.  Vascular: Diminished pedal pulses left lower extremity.  There is some moderate edema noted to the left foot and ankle  Neurological: Epicritic and protective threshold absent bilaterally.   Musculoskeletal Exam: H/o AKA RLE.  Today the patient is nonambulatory in a wheelchair.   Assessment: 1.  Sanguinous bullous lesion left posterior heel -overlying epidermis intact 2.  Diabetes mellitus type 2 3.  Peripheral vascular disease left lower extremity 4. H/o  AKA RLE   Plan of Care:  1. Patient evaluated.   2.  The large sanguinous blister was left intact today.  Instructions to continue to apply Betadine to the area daily. 3.  Arterial Dopplers are still pending.  Office will follow up 4.  Postoperative shoe dispensed.  Weightbearing as tolerated 5.  Return to clinic after vascular ultrasound to review the results and discuss treatment options   *brother-in-law's name is Richmond Campbell, DPM [facility]  Dr. Felecia Shelling, DPM    [address] [place], [place] [address]                Office [phone]  Fax [phone]

## 2020-05-11 ENCOUNTER — Other Ambulatory Visit: Payer: Self-pay

## 2020-05-11 ENCOUNTER — Ambulatory Visit (INDEPENDENT_AMBULATORY_CARE_PROVIDER_SITE_OTHER)
Admission: RE | Admit: 2020-05-11 | Discharge: 2020-05-11 | Disposition: A | Discharge status: Home / Self Care | Payer: Medicare Other | Source: Ambulatory Visit | Attending: Podiatry | Admitting: Podiatry

## 2020-05-11 ENCOUNTER — Ambulatory Visit (HOSPITAL_COMMUNITY)
Admission: RE | Admit: 2020-05-11 | Discharge: 2020-05-11 | Disposition: A | Discharge status: Home / Self Care | Payer: Medicare Other | Source: Ambulatory Visit | Attending: Podiatry | Admitting: Podiatry

## 2020-05-11 DIAGNOSIS — I739 Peripheral vascular disease, unspecified: Secondary | ICD-10-CM | POA: Diagnosis not present

## 2020-05-11 DIAGNOSIS — L97411 Non-pressure chronic ulcer of right heel and midfoot limited to breakdown of skin: Secondary | ICD-10-CM

## 2020-05-11 DIAGNOSIS — S90822A Blister (nonthermal), left foot, initial encounter: Secondary | ICD-10-CM | POA: Insufficient documentation

## 2020-05-11 DIAGNOSIS — E0843 Diabetes mellitus due to underlying condition with diabetic autonomic (poly)neuropathy: Secondary | ICD-10-CM | POA: Diagnosis present

## 2020-05-11 DIAGNOSIS — E1351 Other specified diabetes mellitus with diabetic peripheral angiopathy without gangrene: Secondary | ICD-10-CM | POA: Diagnosis present

## 2020-05-11 DIAGNOSIS — E11621 Type 2 diabetes mellitus with foot ulcer: Secondary | ICD-10-CM | POA: Insufficient documentation

## 2020-05-16 ENCOUNTER — Telehealth: Payer: Self-pay | Admitting: Podiatry

## 2020-05-16 NOTE — Telephone Encounter (Signed)
Pts son called and states patient is experiencing severe pain and would like to know if the doctor can prescribe any medication. Pharmacy is [facility] on [address]

## 2020-05-17 ENCOUNTER — Other Ambulatory Visit: Payer: Self-pay | Admitting: Podiatry

## 2020-05-17 MED ORDER — TRAMADOL HCL 50 MG PO TABS: 50.0000 mg | ORAL_TABLET | Freq: Three times a day (TID) | ORAL | 0 refills | Status: AC | PRN

## 2020-05-17 NOTE — Telephone Encounter (Addendum)
I spoke with pt's son, [name] and he states pt had the circulation testing last Friday and has an aappt with Dr. Lilian Kapur tomorrow, but pt is unable to sleep a night due to the pain. I informed [name] I would send a message to Dr. Lilian Kapur concerning pt's pain and the results of the circulation test.

## 2020-05-17 NOTE — Addendum Note (Signed)
Addended by: Nicholes Rough on: 05/17/2020 01:41 PM   Modules accepted: Orders

## 2020-05-17 NOTE — Telephone Encounter (Signed)
I spoke with Jeremiah [name] and his wife today, he has been having worsening pain and difficulty sleeping secondary to this. Tylenol, motrin and gabapentin have not been helpful. Rx for tramadol 50mg  qhs sent to pharmacy by Dr . Will re-evaluate tomorrow AM  Allena Katz, DPM 05/17/20 2:35PM

## 2020-05-18 ENCOUNTER — Other Ambulatory Visit: Payer: Self-pay

## 2020-05-18 ENCOUNTER — Encounter: Payer: Self-pay | Admitting: Podiatry

## 2020-05-18 ENCOUNTER — Ambulatory Visit: Payer: Medicare Other | Admitting: Podiatry

## 2020-05-18 DIAGNOSIS — I7025 Atherosclerosis of native arteries of other extremities with ulceration: Secondary | ICD-10-CM | POA: Diagnosis not present

## 2020-05-18 DIAGNOSIS — L97421 Non-pressure chronic ulcer of left heel and midfoot limited to breakdown of skin: Secondary | ICD-10-CM | POA: Diagnosis not present

## 2020-05-18 DIAGNOSIS — I739 Peripheral vascular disease, unspecified: Secondary | ICD-10-CM

## 2020-05-18 DIAGNOSIS — S78111A Complete traumatic amputation at level between right hip and knee, initial encounter: Secondary | ICD-10-CM | POA: Diagnosis not present

## 2020-05-18 NOTE — Progress Notes (Signed)
Subjective:  Patient ID: Jeremiah [name], male    DOB: December 21, 1945,  MRN: [id number]  Chief Complaint  Patient presents with  . Foot Pain    follow up left foot pain  . Diabetes    A1C  6.7  . PVD    74 y.o. male presents for wound care.  He is well-known to Dr. Logan Bores.  After his last visit Dr. Logan Bores sent him for left lower extremity arterial duplex.  He has a history of a right above-knee amputation.  This performed by Dr. [name] from vascular surgery in December 2019.  He is also known to Dr. Arbie Cookey from vascular surgery.  He is here today for wound evaluation as well as review of his arterial study.  His son is present here with him today.  Review of Systems: Negative except as noted in the HPI. Denies N/V/F/Ch.  Past Medical History:  Diagnosis Date  . Amputee   . Aortic atherosclerosis (HCC)   . Arthritis    "joints; shoulders, knees, hands, back" (05/21/2018)  . Atherosclerosis of coronary artery   . C. difficile diarrhea 04/2018  . Diastolic CHF (HCC)   . Diverticulosis   . High cholesterol   . History of gout   . Hypertension   . IDA (iron deficiency anemia)    from referral Dr Darleene Cleaver  . Internal hemorrhoids   . Peripheral vascular disease (HCC)   . Pneumonia    "couple times" (05/21/2018)  . Sleep apnea    "has mask; won't use" (05/21/2018)  . Type II diabetes mellitus (HCC)     Current Outpatient Medications:  .  acetaminophen (TYLENOL) 500 MG tablet, Take 1,000 mg by mouth every 6 (six) hours as needed for moderate pain or headache., Disp: , Rfl:  .  amLODipine (NORVASC) 10 MG tablet, Take 1 tablet (10 mg total) by mouth daily., Disp: 30 tablet, Rfl: 0 .  aspirin 81 MG tablet, Take 81 mg by mouth daily., Disp: , Rfl:  .  atorvastatin (LIPITOR) 20 MG tablet, Take 1 tablet (20 mg total) by mouth daily., Disp: 30 tablet, Rfl: 1 .  brimonidine (ALPHAGAN) 0.2 % ophthalmic solution, Place 1 drop into both eyes 2 (two) times daily., Disp: 5 mL, Rfl: 12 .  gabapentin  (NEURONTIN) 100 MG capsule, TAKE 1 CAPSULE (100 MG TOTAL) BY MOUTH 3 (THREE) TIMES DAILY., Disp: 90 capsule, Rfl: 1 .  glimepiride (AMARYL) 1 MG tablet, Take 1 tablet (1 mg total) by mouth daily with breakfast., Disp: 30 tablet, Rfl: 1 .  losartan (COZAAR) 100 MG tablet, Take 1 tablet (100 mg total) by mouth daily., Disp: 30 tablet, Rfl: 3 .  metoprolol succinate (TOPROL-XL) 50 MG 24 hr tablet, Take 50 mg by mouth daily. , Disp: , Rfl:  .  timolol (TIMOPTIC) 0.5 % ophthalmic solution, Place 1 drop into both eyes 2 (two) times daily., Disp: 10 mL, Rfl: 6 .  traMADol (ULTRAM) 50 MG tablet, Take 1 tablet (50 mg total) by mouth every 8 (eight) hours as needed for up to 5 days., Disp: 15 tablet, Rfl: 0  Social History   Tobacco Use  Smoking Status Former Smoker  . Years: 24.00  . Types: Cigarettes  . Quit date: 11/25/1983  . Years since quitting: 36.5  Smokeless Tobacco Never Used    No Known Allergies Objective:  There were no vitals filed for this visit. There is no height or weight on file to calculate BMI. Constitutional Well developed. Well nourished.  Vascular  nonpalpable pedal pulses in the left lower extremity Above amputation right Pedal hair growth absent.  Neurologic Normal speech. Oriented to person, place, and time. Protective sensation absent  Dermatologic Wound Location: L heel Wound Base: Dry stable eschar Peri-wound: Normal Exudate: None: wound tissue dry Wound Measurements: -5 cm x 5 cm x 0.2 cm  Orthopedic: No pain to palpation either foot.   Left lower extremity arterial duplex: Complete occlusion of the mid SFA with reconstitution at the adductor canal  Radiographs: Not applicable Assessment:   1. Atherosclerosis of native arteries of the extremities with ulceration ([place])   2. Unilateral AKA, right ([place])   3. Peripheral vascular disease ([place])   4. Ulcer of left heel, limited to breakdown of skin [facility])    Plan:  Patient was evaluated and treated and all  questions answered.  Ulcer left heel -Eschar is dry and stable.  No debridement was necessary. -Dressed with Betadine, DSD. -Continue off-loading with surgical shoe. -Home nursing referral be sent for daily dressing changes with Santyl and DSD. -Although his ulcer is stable, and although coalesced into a dry necrotic patch, he remains at significant risk for limb loss of the left lower extremity.  He is already a unilateral amputee on the right side.  This has significant occasions for his overall morbidity mortality.  I would like him to return to see Dr. Donzetta Matters as soon as possible.  Return to see myself or Dr. Amalia Hailey following vascular surgery consultation Signs and symptoms of infection were reviewed.  Strict return precautions to both the office and the emergency department were given.  Lanae Crumbly, DPM 05/18/2020     Return in about 4 weeks (around 06/15/2020) for or after visit w/ Vascular surgery, can see myself or Dr Amalia Hailey.

## 2020-05-18 NOTE — Patient Instructions (Addendum)
Monitor for any signs/symptoms of infection such as redness, increased drainage or leakage, foul smelling odors. Call the office immediately if any occur or go directly to the emergency room. Call with any questions/concerns.   If you do not hear from Dr [name] or Dr Arbie Cookey, please call their office to schedule an appointment

## 2020-05-21 ENCOUNTER — Telehealth: Payer: Self-pay | Admitting: Podiatry

## 2020-05-21 NOTE — Telephone Encounter (Signed)
Pt called to inform the doctor that he has been scheduled for an appt with Dr. [name] at [facility] for this Friday.

## 2020-05-25 ENCOUNTER — Other Ambulatory Visit: Payer: Self-pay

## 2020-05-25 ENCOUNTER — Ambulatory Visit (INDEPENDENT_AMBULATORY_CARE_PROVIDER_SITE_OTHER): Payer: Medicare Other | Admitting: Vascular Surgery

## 2020-05-25 ENCOUNTER — Encounter: Payer: Self-pay | Admitting: Vascular Surgery

## 2020-05-25 VITALS — BP 145/64 | HR 58 | Temp 98.0°F | Resp 20 | Ht 69.0 in | Wt 160.0 lb

## 2020-05-25 DIAGNOSIS — I739 Peripheral vascular disease, unspecified: Secondary | ICD-10-CM | POA: Diagnosis not present

## 2020-05-25 NOTE — Progress Notes (Signed)
Patient ID: Jeremiah [name], male   DOB: [birth year], 74 y.o.   MRN: [id number]  Reason for Consult: Follow-up   Referred by No ref. provider found  Subjective:     HPI:  Jeremiah [name] is a 74 y.o. male with previous right aka now with left heel ulcer. Has been seen by podiatry. Still uses left leg for transfers and mobility.  Past Medical History:  Diagnosis Date  . Amputee   . Aortic atherosclerosis (HCC)   . Arthritis    "joints; shoulders, knees, hands, back" (05/21/2018)  . Atherosclerosis of coronary artery   . C. difficile diarrhea 04/2018  . Diastolic CHF (HCC)   . Diverticulosis   . High cholesterol   . History of gout   . Hypertension   . IDA (iron deficiency anemia)    from referral Dr Darleene Cleaver  . Internal hemorrhoids   . Peripheral vascular disease (HCC)   . Pneumonia    "couple times" (05/21/2018)  . Sleep apnea    "has mask; won't use" (05/21/2018)  . Type II diabetes mellitus (HCC)    Family History  Problem Relation Age of Onset  . Diabetes Mother   . Hypertension Mother   . Heart disease Father   . Heart attack Father   . Diabetes Sister   . Hypertension Sister   . Diabetes Brother   . Heart disease Brother   . Hypertension Brother   . Heart attack Brother    Past Surgical History:  Procedure Laterality Date  . ABDOMINAL AORTOGRAM W/LOWER EXTREMITY N/A 11/01/2018   Procedure: ABDOMINAL AORTOGRAM W/LOWER EXTREMITY;  Surgeon: Runell Gess, MD;  Location: [facility];  Service: Cardiovascular;  Laterality: N/A;  . AMPUTATION Right 11/09/2018   Procedure: RIGHT - AMPUTATION ABOVE KNEE;  Surgeon: Maeola Harman, MD;  Location: [facility] OR;  Service: Vascular;  Laterality: Right;  . CATARACT EXTRACTION W/ INTRAOCULAR LENS  IMPLANT, BILATERAL Bilateral   . CHOLECYSTECTOMY  05/21/2018   ATTEMPTED LAPAROSCOPIC CHOLECYSTECTOMY, OPEN DRAINAGE OF GALLBLADDER WITH BIOPSY  . CHOLECYSTECTOMY N/A 05/21/2018   Procedure: ATTEMPTED LAPAROSCOPIC  CHOLECYSTECTOMY, OPEN DRAINAGE OF GALLBLADDER WITH BIOPSY;  Surgeon: Griselda Miner, MD;  Location: [facility] OR;  Service: General;  Laterality: N/A;  . COLONOSCOPY W/ POLYPECTOMY    . COLONOSCOPY WITH PROPOFOL N/A 09/16/2018   Procedure: COLONOSCOPY WITH PROPOFOL;  Surgeon: Charlott Rakes, MD;  Location: [facility];  Service: Endoscopy;  Laterality: N/A;  . 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 FLEXIBLE SIGMOIDOSCOPY N/A 04/20/2020   Procedure: FLEXIBLE SIGMOIDOSCOPY;  Surgeon: Meridee Score Netty Starring., MD;  Location: [facility];  Service: Gastroenterology;  Laterality: N/A;  Fecal Disimpaction  . IR CATHETER TUBE CHANGE  04/14/2018  . IR CHOLANGIOGRAM EXISTING TUBE  03/17/2018  . IR PERC CHOLECYSTOSTOMY  01/31/2018  . IR RADIOLOGIST EVAL & MGMT  03/02/2018    Short Social History:  Social History   Tobacco Use  . Smoking status: Former Smoker    Years: 24.00    Types: Cigarettes    Quit date: 11/25/1983    Years since quitting: 36.5  . Smokeless tobacco: Never Used  Substance Use Topics  . Alcohol use: Not Currently    No Known Allergies  Current Outpatient Medications  Medication Sig Dispense Refill  . acetaminophen (TYLENOL) 500 MG tablet Take 1,000 mg by mouth every 6 (six) hours as needed  for moderate pain or headache.    Marland Kitchen amLODipine (NORVASC) 10 MG tablet Take 1 tablet (10 mg total) by mouth daily. 30 tablet 0  . aspirin 81 MG tablet Take 81 mg by mouth daily.    Marland Kitchen atorvastatin (LIPITOR) 20 MG tablet Take 1 tablet (20 mg total) by mouth daily. 30 tablet 1  . brimonidine (ALPHAGAN) 0.2 % ophthalmic solution Place 1 drop into both eyes 2 (two) times daily. 5 mL 12  . gabapentin (NEURONTIN) 100 MG capsule TAKE 1 CAPSULE (100 MG TOTAL) BY MOUTH 3 (THREE) TIMES DAILY. 90 capsule 1  . glimepiride (AMARYL) 1 MG tablet Take 1 tablet (1 mg total) by mouth daily  with breakfast. 30 tablet 1  . losartan (COZAAR) 100 MG tablet Take 1 tablet (100 mg total) by mouth daily. 30 tablet 3  . metoprolol succinate (TOPROL-XL) 50 MG 24 hr tablet Take 50 mg by mouth daily.     . timolol (TIMOPTIC) 0.5 % ophthalmic solution Place 1 drop into both eyes 2 (two) times daily. 10 mL 6   No current facility-administered medications for this visit.    Review of Systems  Constitutional:  Constitutional negative. HENT: HENT negative.  Eyes: Eyes negative.  Respiratory: Respiratory negative.  Cardiovascular: Cardiovascular negative.  GI: Gastrointestinal negative.  Musculoskeletal: Musculoskeletal negative.  Skin: Positive for wound.  Neurological: Neurological negative. Hematologic: Hematologic/lymphatic negative.  Psychiatric: Psychiatric negative.        Objective:  Objective   Vitals:   05/25/20 0918  BP: (!) 145/64  Pulse: (!) 58  Resp: 20  Temp: 98 F (36.7 C)  SpO2: 95%  Weight: 160 lb (72.6 kg)  Height: 5\' 9"  (1.753 m)   Body mass index is 23.63 kg/m.  Physical Exam Constitutional:      Appearance: Normal appearance.  HENT:     Head: Normocephalic.     Nose:     Comments: Wearing a mask Eyes:     Pupils: Pupils are equal, round, and reactive to light.  Cardiovascular:     Pulses:          Radial pulses are 2+ on the right side and 2+ on the left side.       Femoral pulses are 2+ on the right side and 2+ on the left side. Pulmonary:     Effort: Pulmonary effort is normal.  Abdominal:     General: Abdomen is flat.     Palpations: Abdomen is soft.  Musculoskeletal:        General: Normal range of motion.  Skin:    General: Skin is warm and dry.     Capillary Refill: Capillary refill takes less than 2 seconds.  Neurological:     General: No focal deficit present.     Mental Status: He is alert.  Psychiatric:        Mood and Affect: Mood normal.        Behavior: Behavior normal.        Thought Content: Thought content normal.         Judgment: Judgment normal.     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      Assessment/Plan:     74yo with previous right aka now with left heel ulceration left SFA stenosis.  I discussed with the patient proceeding with left above-knee amputation versus angiography possible stent of the left SFA and hopeful for wound healing.  Patient would like to proceed with endovascular intervention in hopes that we can get his heel to heal.     Maeola Harman MD [facility]

## 2020-06-11 ENCOUNTER — Telehealth: Payer: Self-pay | Admitting: Podiatry

## 2020-06-11 NOTE — Telephone Encounter (Signed)
Maren Reamer care taker for [facility] called wanting to know if the there were any orders put in for this pt to have wound dressing change  or should we have a visit to see if pt still needs care contact Maren Reamer [phone]

## 2020-06-12 ENCOUNTER — Telehealth: Payer: Self-pay | Admitting: Podiatry

## 2020-06-12 ENCOUNTER — Telehealth: Payer: Self-pay

## 2020-06-12 NOTE — Telephone Encounter (Addendum)
Maren Reamer, NP, said this has been taken care of.  [facility] was supposed to have placed this order.  Ernst Spell., LPN  ----- Message from Maeola Harman, MD sent at 06/12/2020  1:01 PM EDT ----- Regarding: RE: Home Health Contact: [phone] Referral was made by podiatry.   bc ----- Message ----- From: Yolonda Kida, LPN Sent: 8/63/8177   9:41 AM EDT To: Maeola Harman, MD Subject: Home Health                                    Hey Dr. [name].  Maren Reamer, [facility] and [facility] requesting [facility] wound Care orders for this patient. Are we just doing daily wet to dry dressing changes?  Please advise.  Thanks,  [place] Skalicky E.

## 2020-06-12 NOTE — Telephone Encounter (Signed)
Wound care orders will be: change 3x weekly with santyl ointment to wound beds with saline moistened gauze and dry sterile gauze 4x4, and gauze roll. Do not use compression or ACE wraps.    Thank you! Madelaine Bhat

## 2020-06-12 NOTE — Telephone Encounter (Signed)
Spoke to pts son to set appt for the wound on fathers heel to schedule appt son states he will call back to schedule

## 2020-06-12 NOTE — Telephone Encounter (Signed)
Faxed required form, SnapShot with clinicals and demographics to [facility]. 

## 2020-06-12 NOTE — Telephone Encounter (Signed)
This message answered in 06/12/2020 11:08am Telephone Call.

## 2020-06-12 NOTE — Telephone Encounter (Signed)
Amy from [facility] sees pts wife she stated that she look at [facility] foot and its a open wound she stated that if we wanted to put wound care orders as well as  home health orders to [facility] she stated that we could do so also stated that Mr.Foxworthy does need to come in for appt will call and schedule pt appt

## 2020-06-14 ENCOUNTER — Telehealth: Payer: Self-pay | Admitting: Physician Assistant

## 2020-06-14 NOTE — Telephone Encounter (Signed)
Our office received a call from the patient's home health agency concerned about the appearance of the patient's left heel.  I was asked to review notes from triage staff.  I did not speak to the home health agent directly.  The report is that the patient is complaining of pain and there is foul odor of his ulcer, but that the heel is not infected and he has had no fever.    The patient was seen in the office and evaluated by Dr. [name] on May 25, 2020 and scheduled for arteriogram on July 02, 2020.  Briefly, the patient's past vascular history includes bilateral occluded superficial femoral arteries noted on arteriogram dated November 01, 2018.  At that time, he had two-vessel runoff of the left lower extremity.  He had a right heel ulcer and was counseled regarding need for femoral to popliteal bypass.  This was arranged, however the patient presented with sepsis and had to undergo right above-the-knee amputation.  At his visit with Dr. [name] earlier this month, his left ABI and TBI were approximately 0.6.  A left heel wound is documented but there are no photos or description of the severity of the wound.  The patient had bilateral palpable femoral pulses.  The patient was given the option for left above-knee amputation versus arteriogram with possible SFA stenting.  The patient did not want to pursue amputation as he uses his left leg for transfers.  I recommend follow-up at the next available appointment in the [facility] to assess his left heel wound and to determine whether his foot is salvageable and/or whether we can schedule his arteriogram sooner.

## 2020-06-15 ENCOUNTER — Ambulatory Visit: Payer: Medicare Other | Admitting: Podiatry

## 2020-06-18 ENCOUNTER — Inpatient Hospital Stay (HOSPITAL_COMMUNITY): Payer: Medicare Other

## 2020-06-18 ENCOUNTER — Encounter (HOSPITAL_COMMUNITY): Payer: Self-pay | Admitting: Pharmacy Technician

## 2020-06-18 ENCOUNTER — Inpatient Hospital Stay (HOSPITAL_COMMUNITY)
Admission: EM | Admit: 2020-06-18 | Discharge: 2020-06-21 | DRG: 253 | Disposition: A | Discharge status: Home Health | Payer: Medicare Other | Attending: Family Medicine | Admitting: Family Medicine

## 2020-06-18 ENCOUNTER — Ambulatory Visit (INDEPENDENT_AMBULATORY_CARE_PROVIDER_SITE_OTHER): Payer: Medicare Other | Admitting: Physician Assistant

## 2020-06-18 ENCOUNTER — Other Ambulatory Visit: Payer: Self-pay

## 2020-06-18 ENCOUNTER — Emergency Department (HOSPITAL_COMMUNITY): Payer: Medicare Other

## 2020-06-18 VITALS — BP 151/64 | HR 56 | Temp 97.1°F

## 2020-06-18 DIAGNOSIS — E1152 Type 2 diabetes mellitus with diabetic peripheral angiopathy with gangrene: Secondary | ICD-10-CM | POA: Diagnosis present

## 2020-06-18 DIAGNOSIS — Z79899 Other long term (current) drug therapy: Secondary | ICD-10-CM | POA: Diagnosis not present

## 2020-06-18 DIAGNOSIS — E1142 Type 2 diabetes mellitus with diabetic polyneuropathy: Secondary | ICD-10-CM | POA: Diagnosis present

## 2020-06-18 DIAGNOSIS — L97529 Non-pressure chronic ulcer of other part of left foot with unspecified severity: Secondary | ICD-10-CM

## 2020-06-18 DIAGNOSIS — I70262 Atherosclerosis of native arteries of extremities with gangrene, left leg: Secondary | ICD-10-CM | POA: Diagnosis present

## 2020-06-18 DIAGNOSIS — I739 Peripheral vascular disease, unspecified: Secondary | ICD-10-CM

## 2020-06-18 DIAGNOSIS — I5031 Acute diastolic (congestive) heart failure: Secondary | ICD-10-CM | POA: Diagnosis not present

## 2020-06-18 DIAGNOSIS — Z7982 Long term (current) use of aspirin: Secondary | ICD-10-CM | POA: Diagnosis not present

## 2020-06-18 DIAGNOSIS — I110 Hypertensive heart disease with heart failure: Secondary | ICD-10-CM | POA: Diagnosis present

## 2020-06-18 DIAGNOSIS — Z89611 Acquired absence of right leg above knee: Secondary | ICD-10-CM | POA: Diagnosis not present

## 2020-06-18 DIAGNOSIS — Z9119 Patient's noncompliance with other medical treatment and regimen: Secondary | ICD-10-CM

## 2020-06-18 DIAGNOSIS — I2510 Atherosclerotic heart disease of native coronary artery without angina pectoris: Secondary | ICD-10-CM | POA: Diagnosis present

## 2020-06-18 DIAGNOSIS — I10 Essential (primary) hypertension: Secondary | ICD-10-CM | POA: Diagnosis present

## 2020-06-18 DIAGNOSIS — L97429 Non-pressure chronic ulcer of left heel and midfoot with unspecified severity: Secondary | ICD-10-CM | POA: Diagnosis present

## 2020-06-18 DIAGNOSIS — R0602 Shortness of breath: Secondary | ICD-10-CM

## 2020-06-18 DIAGNOSIS — I998 Other disorder of circulatory system: Secondary | ICD-10-CM | POA: Diagnosis not present

## 2020-06-18 DIAGNOSIS — S91302A Unspecified open wound, left foot, initial encounter: Secondary | ICD-10-CM | POA: Diagnosis not present

## 2020-06-18 DIAGNOSIS — Z87891 Personal history of nicotine dependence: Secondary | ICD-10-CM | POA: Diagnosis not present

## 2020-06-18 DIAGNOSIS — G4733 Obstructive sleep apnea (adult) (pediatric): Secondary | ICD-10-CM | POA: Diagnosis present

## 2020-06-18 DIAGNOSIS — E7800 Pure hypercholesterolemia, unspecified: Secondary | ICD-10-CM | POA: Diagnosis present

## 2020-06-18 DIAGNOSIS — M810 Age-related osteoporosis without current pathological fracture: Secondary | ICD-10-CM | POA: Diagnosis present

## 2020-06-18 DIAGNOSIS — Z20822 Contact with and (suspected) exposure to covid-19: Secondary | ICD-10-CM | POA: Diagnosis present

## 2020-06-18 DIAGNOSIS — I5032 Chronic diastolic (congestive) heart failure: Secondary | ICD-10-CM | POA: Diagnosis present

## 2020-06-18 DIAGNOSIS — I96 Gangrene, not elsewhere classified: Secondary | ICD-10-CM | POA: Diagnosis present

## 2020-06-18 DIAGNOSIS — E785 Hyperlipidemia, unspecified: Secondary | ICD-10-CM | POA: Diagnosis present

## 2020-06-18 DIAGNOSIS — H9190 Unspecified hearing loss, unspecified ear: Secondary | ICD-10-CM | POA: Diagnosis present

## 2020-06-18 DIAGNOSIS — E11621 Type 2 diabetes mellitus with foot ulcer: Secondary | ICD-10-CM | POA: Diagnosis present

## 2020-06-18 DIAGNOSIS — Z89619 Acquired absence of unspecified leg above knee: Secondary | ICD-10-CM

## 2020-06-18 DIAGNOSIS — E11649 Type 2 diabetes mellitus with hypoglycemia without coma: Secondary | ICD-10-CM | POA: Diagnosis not present

## 2020-06-18 DIAGNOSIS — Z7984 Long term (current) use of oral hypoglycemic drugs: Secondary | ICD-10-CM

## 2020-06-18 DIAGNOSIS — E119 Type 2 diabetes mellitus without complications: Secondary | ICD-10-CM

## 2020-06-18 LAB — COMPREHENSIVE METABOLIC PANEL
ALT: 15 U/L (ref 0–44)
AST: 14 U/L — ABNORMAL LOW (ref 15–41)
Albumin: 2.7 g/dL — ABNORMAL LOW (ref 3.5–5.0)
Alkaline Phosphatase: 99 U/L (ref 38–126)
Anion gap: 10 (ref 5–15)
BUN: 17 mg/dL (ref 8–23)
CO2: 25 mmol/L (ref 22–32)
Calcium: 9 mg/dL (ref 8.9–10.3)
Chloride: 101 mmol/L (ref 98–111)
Creatinine, Ser: 1.39 mg/dL — ABNORMAL HIGH (ref 0.61–1.24)
GFR calc Af Amer: 57 mL/min — ABNORMAL LOW (ref >60)
GFR calc non Af Amer: 49 mL/min — ABNORMAL LOW (ref >60)
Glucose, Bld: 111 mg/dL — ABNORMAL HIGH (ref 70–99)
Potassium: 3.6 mmol/L (ref 3.5–5.1)
Sodium: 136 mmol/L (ref 135–145)
Total Bilirubin: 0.7 mg/dL (ref 0.3–1.2)
Total Protein: 7.8 g/dL (ref 6.5–8.1)

## 2020-06-18 LAB — CBC
HCT: 30 % — ABNORMAL LOW (ref 39.0–52.0)
Hemoglobin: 9.4 g/dL — ABNORMAL LOW (ref 13.0–17.0)
MCH: 26.3 pg (ref 26.0–34.0)
MCHC: 31.3 g/dL (ref 30.0–36.0)
MCV: 84 fL (ref 80.0–100.0)
Platelets: 239 10*3/uL (ref 150–400)
RBC: 3.57 MIL/uL — ABNORMAL LOW (ref 4.22–5.81)
RDW: 14.5 % (ref 11.5–15.5)
WBC: 11.5 10*3/uL — ABNORMAL HIGH (ref 4.0–10.5)
nRBC: 0 % (ref 0.0–0.2)

## 2020-06-18 LAB — CBC WITH DIFFERENTIAL/PLATELET
Abs Immature Granulocytes: 0.04 10*3/uL (ref 0.00–0.07)
Basophils Absolute: 0.1 10*3/uL (ref 0.0–0.1)
Basophils Relative: 1 %
Eosinophils Absolute: 1.1 10*3/uL — ABNORMAL HIGH (ref 0.0–0.5)
Eosinophils Relative: 10 %
HCT: 29.9 % — ABNORMAL LOW (ref 39.0–52.0)
Hemoglobin: 9.6 g/dL — ABNORMAL LOW (ref 13.0–17.0)
Immature Granulocytes: 0 %
Lymphocytes Relative: 16 %
Lymphs Abs: 1.8 10*3/uL (ref 0.7–4.0)
MCH: 26.9 pg (ref 26.0–34.0)
MCHC: 32.1 g/dL (ref 30.0–36.0)
MCV: 83.8 fL (ref 80.0–100.0)
Monocytes Absolute: 0.6 10*3/uL (ref 0.1–1.0)
Monocytes Relative: 5 %
Neutro Abs: 7.5 10*3/uL (ref 1.7–7.7)
Neutrophils Relative %: 68 %
Platelets: 255 10*3/uL (ref 150–400)
RBC: 3.57 MIL/uL — ABNORMAL LOW (ref 4.22–5.81)
RDW: 14.5 % (ref 11.5–15.5)
WBC: 11.1 10*3/uL — ABNORMAL HIGH (ref 4.0–10.5)
nRBC: 0 % (ref 0.0–0.2)

## 2020-06-18 LAB — HEMOGLOBIN A1C
Hgb A1c MFr Bld: 6.1 % — ABNORMAL HIGH (ref 4.8–5.6)
Mean Plasma Glucose: 128.37 mg/dL

## 2020-06-18 LAB — TYPE AND SCREEN
ABO/RH(D): B POS
Antibody Screen: NEGATIVE

## 2020-06-18 LAB — PROCALCITONIN: Procalcitonin: <0.1 ng/mL

## 2020-06-18 LAB — CBG MONITORING, ED: Glucose-Capillary: 80 mg/dL (ref 70–99)

## 2020-06-18 LAB — PROTIME-INR
INR: 1.1 (ref 0.8–1.2)
Prothrombin Time: 14.1 seconds (ref 11.4–15.2)

## 2020-06-18 LAB — SURGICAL PCR SCREEN
MRSA, PCR: NEGATIVE
Staphylococcus aureus: NEGATIVE

## 2020-06-18 LAB — GLUCOSE, CAPILLARY: Glucose-Capillary: 105 mg/dL — ABNORMAL HIGH (ref 70–99)

## 2020-06-18 LAB — SARS CORONAVIRUS 2 BY RT PCR (HOSPITAL ORDER, PERFORMED IN ~~LOC~~ HOSPITAL LAB): SARS Coronavirus 2: NEGATIVE

## 2020-06-18 LAB — C-REACTIVE PROTEIN: CRP: 3.8 mg/dL — ABNORMAL HIGH (ref <1.0)

## 2020-06-18 LAB — CREATININE, SERUM
Creatinine, Ser: 1.31 mg/dL — ABNORMAL HIGH (ref 0.61–1.24)
GFR calc Af Amer: >60 mL/min (ref >60)
GFR calc non Af Amer: 53 mL/min — ABNORMAL LOW (ref >60)

## 2020-06-18 LAB — MRSA PCR SCREENING: MRSA by PCR: NEGATIVE

## 2020-06-18 LAB — MAGNESIUM: Magnesium: 1.9 mg/dL (ref 1.7–2.4)

## 2020-06-18 LAB — BRAIN NATRIURETIC PEPTIDE: B Natriuretic Peptide: 155.9 pg/mL — ABNORMAL HIGH (ref 0.0–100.0)

## 2020-06-18 LAB — LACTIC ACID, PLASMA: Lactic Acid, Venous: 0.8 mmol/L (ref 0.5–1.9)

## 2020-06-18 MED ORDER — TRAMADOL HCL 50 MG PO TABS
50.0000 mg | ORAL_TABLET | Freq: Three times a day (TID) | ORAL | Status: DC | PRN
  Administered 2020-06-20: 50 mg via ORAL
  Filled 2020-06-18: qty 1

## 2020-06-18 MED ORDER — HEPARIN SODIUM (PORCINE) 5000 UNIT/ML IJ SOLN
5000.0000 [IU] | Freq: Three times a day (TID) | INTRAMUSCULAR | Status: DC
  Administered 2020-06-18 – 2020-06-21 (×8): 5000 [IU] via SUBCUTANEOUS
  Filled 2020-06-18 (×9): qty 1

## 2020-06-18 MED ORDER — SODIUM CHLORIDE 0.9% FLUSH: 3.0000 mL | INTRAVENOUS | Status: DC | PRN

## 2020-06-18 MED ORDER — ONDANSETRON HCL 4 MG PO TABS: 4.0000 mg | ORAL_TABLET | Freq: Four times a day (QID) | ORAL | Status: DC | PRN

## 2020-06-18 MED ORDER — AMLODIPINE BESYLATE 10 MG PO TABS
10.0000 mg | ORAL_TABLET | Freq: Every day | ORAL | Status: DC
  Administered 2020-06-18 – 2020-06-19 (×2): 10 mg via ORAL
  Filled 2020-06-18: qty 2
  Filled 2020-06-18: qty 1

## 2020-06-18 MED ORDER — SODIUM CHLORIDE 0.9 % IV SOLN: 250.0000 mL | INTRAVENOUS | Status: DC | PRN

## 2020-06-18 MED ORDER — METOPROLOL SUCCINATE ER 50 MG PO TB24
50.0000 mg | ORAL_TABLET | Freq: Every day | ORAL | Status: DC
  Administered 2020-06-18 – 2020-06-21 (×3): 50 mg via ORAL
  Filled 2020-06-18 (×3): qty 1
  Filled 2020-06-18: qty 2

## 2020-06-18 MED ORDER — ASPIRIN EC 81 MG PO TBEC
81.0000 mg | DELAYED_RELEASE_TABLET | Freq: Every day | ORAL | Status: DC
  Administered 2020-06-18 – 2020-06-21 (×3): 81 mg via ORAL
  Filled 2020-06-18 (×3): qty 1

## 2020-06-18 MED ORDER — SODIUM CHLORIDE 0.9% FLUSH: 3.0000 mL | Freq: Once | INTRAVENOUS | Status: DC

## 2020-06-18 MED ORDER — CARVEDILOL 3.125 MG PO TABS: 3.1250 mg | ORAL_TABLET | Freq: Two times a day (BID) | ORAL | Status: DC

## 2020-06-18 MED ORDER — INSULIN ASPART 100 UNIT/ML ~~LOC~~ SOLN
3.0000 [IU] | Freq: Three times a day (TID) | SUBCUTANEOUS | Status: DC
  Administered 2020-06-18: 3 [IU] via SUBCUTANEOUS

## 2020-06-18 MED ORDER — ATORVASTATIN CALCIUM 10 MG PO TABS
20.0000 mg | ORAL_TABLET | Freq: Every day | ORAL | Status: DC
  Administered 2020-06-18 – 2020-06-21 (×3): 20 mg via ORAL
  Filled 2020-06-18 (×3): qty 2

## 2020-06-18 MED ORDER — HYDRALAZINE HCL 20 MG/ML IJ SOLN: 10.0000 mg | Freq: Four times a day (QID) | INTRAMUSCULAR | Status: DC | PRN

## 2020-06-18 MED ORDER — SODIUM CHLORIDE 0.9% FLUSH
3.0000 mL | Freq: Two times a day (BID) | INTRAVENOUS | Status: DC
  Administered 2020-06-18 – 2020-06-21 (×3): 3 mL via INTRAVENOUS

## 2020-06-18 MED ORDER — HYDROCODONE-ACETAMINOPHEN 5-325 MG PO TABS: 1.0000 | ORAL_TABLET | Freq: Four times a day (QID) | ORAL | Status: DC | PRN

## 2020-06-18 MED ORDER — ACETAMINOPHEN 650 MG RE SUPP: 650.0000 mg | Freq: Four times a day (QID) | RECTAL | Status: DC | PRN

## 2020-06-18 MED ORDER — HYDRALAZINE HCL 50 MG PO TABS
50.0000 mg | ORAL_TABLET | Freq: Three times a day (TID) | ORAL | Status: DC
  Administered 2020-06-18 – 2020-06-19 (×5): 50 mg via ORAL
  Filled 2020-06-18: qty 2
  Filled 2020-06-18 (×4): qty 1

## 2020-06-18 MED ORDER — POLYETHYLENE GLYCOL 3350 17 G PO PACK: 17.0000 g | PACK | Freq: Every day | ORAL | Status: DC | PRN

## 2020-06-18 MED ORDER — SODIUM CHLORIDE 0.9 % IV BOLUS
500.0000 mL | Freq: Once | INTRAVENOUS | Status: AC
  Administered 2020-06-18: 500 mL via INTRAVENOUS

## 2020-06-18 MED ORDER — BRIMONIDINE TARTRATE 0.2 % OP SOLN
1.0000 [drp] | Freq: Two times a day (BID) | OPHTHALMIC | Status: DC
  Administered 2020-06-18 – 2020-06-21 (×6): 1 [drp] via OPHTHALMIC
  Filled 2020-06-18 (×2): qty 5

## 2020-06-18 MED ORDER — INSULIN ASPART 100 UNIT/ML ~~LOC~~ SOLN: 0.0000 [IU] | Freq: Every day | SUBCUTANEOUS | Status: DC

## 2020-06-18 MED ORDER — INSULIN ASPART 100 UNIT/ML ~~LOC~~ SOLN
0.0000 [IU] | Freq: Three times a day (TID) | SUBCUTANEOUS | Status: DC
  Administered 2020-06-19: 3 [IU] via SUBCUTANEOUS
  Administered 2020-06-21: 2 [IU] via SUBCUTANEOUS

## 2020-06-18 MED ORDER — ACETAMINOPHEN 325 MG PO TABS: 650.0000 mg | ORAL_TABLET | Freq: Four times a day (QID) | ORAL | Status: DC | PRN

## 2020-06-18 MED ORDER — ONDANSETRON HCL 4 MG/2ML IJ SOLN: 4.0000 mg | Freq: Four times a day (QID) | INTRAMUSCULAR | Status: DC | PRN

## 2020-06-18 MED ORDER — INSULIN GLARGINE 100 UNIT/ML ~~LOC~~ SOLN
12.0000 [IU] | Freq: Every day | SUBCUTANEOUS | Status: DC
  Administered 2020-06-19: 12 [IU] via SUBCUTANEOUS
  Filled 2020-06-18 (×2): qty 0.12

## 2020-06-18 NOTE — ED Notes (Signed)
Son, [name] would like an update [phone]

## 2020-06-18 NOTE — ED Provider Notes (Addendum)
[facility] EMERGENCY DEPARTMENT Provider Note   CSN: [id number] Arrival date & time: 06/18/20  1003     History Chief Complaint  Patient presents with  . Foot Pain    Jeremiah [name] is a 74 y.o. male history of atherosclerosis, PVD, CHF, hypertension, gout, diabetes, hyperlipidemia, right AKA.  Patient sent in today by vascular office for concern of left heel infection and possible sepsis.  Patient reports that he has had a wound to his left heel for several weeks that he has been trying to treat at home.  He reports that he went for regular follow-up with his vascular surgeon's office today but was sent to the ER for further evaluation.  He reports that he has had a constant pain of his left heel which has not changed in intensity over the last few days.  He reports a mild pain nonradiating worsened with palpation improved with rest, currently moderate in intensity.  Additionally patient reports one episode of nonbloody/nonbilious emesis with breakfast this morning.  He reports he is otherwise feeling well today and would like to go home.  He denies fever/chills, chest pain/shortness of breath, abdominal pain, nausea, diarrhea or any additional concerns.  HPI     Past Medical History:  Diagnosis Date  . Amputee   . Aortic atherosclerosis (HCC)   . Arthritis    "joints; shoulders, knees, hands, back" (05/21/2018)  . Atherosclerosis of coronary artery   . C. difficile diarrhea 04/2018  . Diastolic CHF (HCC)   . Diverticulosis   . High cholesterol   . History of gout   . Hypertension   . IDA (iron deficiency anemia)    from referral Dr Darleene Cleaver  . Internal hemorrhoids   . Peripheral vascular disease (HCC)   . Pneumonia    "couple times" (05/21/2018)  . Sleep apnea    "has mask; won't use" (05/21/2018)  . Type II diabetes mellitus [facility])     Patient Active Problem List   Diagnosis Date Noted  . Post-operative pain 04/21/2019  . Abnormality of gait  03/24/2019  . Hypoglycemia   . Hyperkalemia   . Hyponatremia   . Diabetic peripheral neuropathy (HCC)   . Labile blood glucose   . Enteritis due to Clostridium difficile   . Blood glucose abnormal   . Diarrhea   . Benign essential HTN   . Hypoalbuminemia due to protein-calorie malnutrition (HCC)   . Labile blood pressure   . 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 Postoperative pain   . Acute blood loss anemia   . Dyslipidemia   . Type 2 diabetes mellitus with peripheral neuropathy (HCC)   . S/P AKA (above knee amputation) (HCC) 11/12/2018  . Unilateral AKA, right (HCC)   . Hypertensive crisis   . Diabetes mellitus type 2 in nonobese (HCC)   . Pleural effusion 11/08/2018  . Fever   . Open wound of right foot   . Sepsis (HCC) 11/03/2018  . Altered mental status   . Acute cystitis without hematuria   . Leukocytosis   . Pressure injury of skin 07/08/2018  . Clostridium difficile colitis 07/07/2018  . Hypokalemia 07/07/2018  . HLD (hyperlipidemia) 07/07/2018  . Acute diverticulitis 06/04/2018  . Cholecystitis 05/24/2018  . Cholecystitis with cholelithiasis 05/21/2018  . Acute systolic heart failure (HCC) 03/27/2018  . Diabetes mellitus without complication (HCC) 01/30/2018  . Hypertension 01/30/2018  . Acute cholecystitis 01/30/2018  . AKI (acute kidney injury) (HCC) 01/30/2018  . Normocytic anemia  01/30/2018  . PVD (peripheral vascular disease) (HCC) 09/05/2014    Past Surgical History:  Procedure Laterality Date  . ABDOMINAL AORTOGRAM W/LOWER EXTREMITY N/A 11/01/2018   Procedure: ABDOMINAL AORTOGRAM W/LOWER EXTREMITY;  Surgeon: Runell Gess, MD;  Location: [facility] INVASIVE CV LAB;  Service: Cardiovascular;  Laterality: N/A;  . AMPUTATION Right 11/09/2018   Procedure: RIGHT - AMPUTATION ABOVE KNEE;  Surgeon: Maeola Harman, MD;  Location: [facility] OR;  Service: Vascular;  Laterality: Right;  . CATARACT EXTRACTION W/ INTRAOCULAR LENS  IMPLANT, BILATERAL  Bilateral   . CHOLECYSTECTOMY  05/21/2018   ATTEMPTED LAPAROSCOPIC CHOLECYSTECTOMY, OPEN DRAINAGE OF GALLBLADDER WITH BIOPSY  . CHOLECYSTECTOMY N/A 05/21/2018   Procedure: ATTEMPTED LAPAROSCOPIC CHOLECYSTECTOMY, OPEN DRAINAGE OF GALLBLADDER WITH BIOPSY;  Surgeon: Griselda Miner, MD;  Location: [facility] OR;  Service: General;  Laterality: N/A;  . COLONOSCOPY W/ POLYPECTOMY    . COLONOSCOPY WITH PROPOFOL N/A 09/16/2018   Procedure: COLONOSCOPY WITH PROPOFOL;  Surgeon: Charlott Rakes, MD;  Location: [facility] ENDOSCOPY;  Service: Endoscopy;  Laterality: N/A;  . 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 FLEXIBLE SIGMOIDOSCOPY N/A 04/20/2020   Procedure: FLEXIBLE SIGMOIDOSCOPY;  Surgeon: Meridee Score Netty Starring., MD;  Location: [facility];  Service: Gastroenterology;  Laterality: N/A;  Fecal Disimpaction  . IR CATHETER TUBE CHANGE  04/14/2018  . IR CHOLANGIOGRAM EXISTING TUBE  03/17/2018  . IR PERC CHOLECYSTOSTOMY  01/31/2018  . IR RADIOLOGIST EVAL & MGMT  03/02/2018       Family History  Problem Relation Age of Onset  . Diabetes Mother   . Hypertension Mother   . Heart disease Father   . Heart attack Father   . Diabetes Sister   . Hypertension Sister   . Diabetes Brother   . Heart disease Brother   . Hypertension Brother   . Heart attack Brother     Social History   Tobacco Use  . Smoking status: Former Smoker    Years: 24.00    Types: Cigarettes    Quit date: 11/25/1983    Years since quitting: 36.5  . Smokeless tobacco: Never Used  Vaping Use  . Vaping Use: Never used  Substance Use Topics  . Alcohol use: Not Currently  . Drug use: No    Home Medications Prior to Admission medications   Medication Sig Start Date End Date Taking? Authorizing Provider  acetaminophen (TYLENOL) 500 MG tablet Take 1,000 mg by mouth every 6 (six) hours as needed for moderate pain or  headache.    [provider]  amLODipine (NORVASC) 10 MG tablet Take 1 tablet (10 mg total) by mouth daily. 12/06/18   Angiulli, Mcarthur Rossetti, PA-C  aspirin 81 MG tablet Take 81 mg by mouth daily.    [provider]  atorvastatin (LIPITOR) 20 MG tablet Take 1 tablet (20 mg total) by mouth daily. 12/06/18   Angiulli, Mcarthur Rossetti, PA-C  brimonidine (ALPHAGAN) 0.2 % ophthalmic solution Place 1 drop into both eyes 2 (two) times daily. 12/06/18   Angiulli, Mcarthur Rossetti, PA-C  gabapentin (NEURONTIN) 100 MG capsule TAKE 1 CAPSULE (100 MG TOTAL) BY MOUTH 3 (THREE) TIMES DAILY. 09/13/19   Marcello Fennel, MD  glimepiride (AMARYL) 1 MG tablet Take 1 tablet (1 mg total) by mouth daily with breakfast. 12/06/18   Angiulli, Mcarthur Rossetti, PA-C  glucose blood test strip Accu-Chek Aviva Plus test strips  Take 1 strip 3  times a day by miscell. route for 90 days.    [provider]  glucose blood test strip Accu-Chek Aviva Plus test strips  USE AS DIRECTED THREE TIMES DAILY.    [provider]  losartan (COZAAR) 100 MG tablet Take 1 tablet (100 mg total) by mouth daily. 12/06/18   Angiulli, Mcarthur Rossettianiel J, PA-C  metoprolol succinate (TOPROL-XL) 50 MG 24 hr tablet Take 50 mg by mouth daily.  03/18/19   [provider]  timolol (TIMOPTIC) 0.5 % ophthalmic solution Place 1 drop into both eyes 2 (two) times daily. 12/06/18   Angiulli, Mcarthur Rossettianiel J, PA-C  traMADol (ULTRAM) 50 MG tablet tramadol 50 mg tablet  TAKE ONE TABLET BY MOUTH THREE TIMES DAILY, AS NEEDED FOR PAIN    [provider]    Allergies    Patient has no known allergies.  Review of Systems   Review of Systems Ten systems are reviewed and are negative for acute change except as noted in the HPI  Physical Exam Updated Vital Signs BP (!) 162/71 (BP Location: Right Arm)   Pulse 58   Temp 98.4 F (36.9 C) (Oral)   Resp 16   SpO2 100%   Physical Exam Constitutional:      General: He is not in acute distress.     Appearance: Normal appearance. He is well-developed. He is not ill-appearing or diaphoretic.  HENT:     Head: Normocephalic and atraumatic.  Eyes:     General: Vision grossly intact. Gaze aligned appropriately.     Pupils: Pupils are equal, round, and reactive to light.  Neck:     Trachea: Trachea and phonation normal.  Cardiovascular:     Pulses:          Dorsalis pedis pulses are detected w/ Doppler on the left side.  Pulmonary:     Effort: Pulmonary effort is normal. No respiratory distress.  Abdominal:     General: There is no distension.     Palpations: Abdomen is soft.     Tenderness: There is no abdominal tenderness. There is no guarding or rebound.  Musculoskeletal:        General: Normal range of motion.     Cervical back: Normal range of motion.       Feet:     Right Lower Extremity: Right leg is amputated above knee.  Feet:     Comments: Necrotic wound of the left heel.  No significant surrounding cellulitis.  Tender to touch. Skin:    General: Skin is warm and dry.  Neurological:     Mental Status: He is alert.     GCS: GCS eye subscore is 4. GCS verbal subscore is 5. GCS motor subscore is 6.     Comments: Speech is clear and goal oriented, follows commands Major Cranial nerves without deficit, no facial droop Moves extremities without ataxia, coordination intact  Psychiatric:        Behavior: Behavior normal.     ED Results / Procedures / Treatments   Labs (all labs ordered are listed, but only abnormal results are displayed) Labs Reviewed  COMPREHENSIVE METABOLIC PANEL - Abnormal; Notable for the following components:      Result Value   Glucose, Bld 111 (*)    Creatinine, Ser 1.39 (*)    Albumin 2.7 (*)    AST 14 (*)    GFR calc non Af Amer 49 (*)    GFR calc Af Amer 57 (*)    All other  components within normal limits  CBC WITH DIFFERENTIAL/PLATELET - Abnormal; Notable for the following components:   WBC 11.1 (*)    RBC 3.57 (*)    Hemoglobin 9.6  (*)    HCT 29.9 (*)    Eosinophils Absolute 1.1 (*)    All other components within normal limits  CULTURE, BLOOD (ROUTINE X 2)  CULTURE, BLOOD (ROUTINE X 2)  LACTIC ACID, PLASMA  LACTIC ACID, PLASMA    EKG None  Radiology DG Foot Complete Left  Result Date: 06/18/2020 CLINICAL DATA:  Diabetic foot ulcer. EXAM: LEFT FOOT - COMPLETE 3+ VIEW COMPARISON:  04/11/2020 FINDINGS: Open wound noted on the plantar aspect of the heel. No evidence of underlying osteomyelitis involving the calcaneus. Advanced osteoporosis noted and there are moderate forefoot, midfoot and hindfoot degenerative changes. No definite destructive bony lesions. Advanced vascular calcifications are noted. IMPRESSION: 1. Open wound on the plantar aspect of the heel. 2. No definite plain film findings for osteomyelitis. 3. Advanced osteoporosis and degenerative changes. Electronically Signed   By: Rudie Meyer M.D.   On: 06/18/2020 10:58    Procedures Procedures (including critical care time)  Medications Ordered in ED Medications  sodium chloride flush (NS) 0.9 % injection 3 mL (3 mLs Intravenous Not Given 06/18/20 1207)  sodium chloride 0.9 % bolus 500 mL (has no administration in time range)    ED Course  I have reviewed the triage vital signs and the nursing notes.  Pertinent labs & imaging results that were available during my care of the patient were reviewed by me and considered in my medical decision making (see chart for details).  Clinical Course as of Jun 18 1332  Mon Jun 18, 2020  1237 Vascular   [BM]  1323 74 yo male presenting to ED with large left diabetic foot ulcer on heel of left foot, referred into ED from vasc surgery clinic today by provider with concern for possible sepsis, as the patient reported some fatigue and nausea earlier today and yesterday.  Here in the ED the patient is asymptomatic.  He is afebrile and has a very minor leukocytosis at 11.1.  He has an ulceration of the right foot  without clear evidence of osteomyelitis on xray, and no purulence or erythema suggestive of an acute infection on my exam.  My overall suspicion is lower for sepsis based on his clinical appearance.  We've asked the vascular surgery team to re-evaluate the patient regarding further surgical plans, as there is mention of possibly needing a left leg amputation in the provider note from earlier.  From an infection standpoint, I think he's reasonably well appearing to be discharged on doxycycline, as he strongly wants to go home.  We can send off blood cultures and given return precautions if necessary.   [MT]    Clinical Course User Index [BM] Bill Salinas, PA-C [MT] Renaye Rakers Kermit Balo, MD   MDM Rules/Calculators/A&P                          Additional history obtained from: 1. Nursing notes from this visit. 2. Reviewed electronic medical record.  There is a photo uploaded by vascular surgery PA at 9 AM today, consistent with exam here.  Reviewed vascular surgery note from today, diagnosis of PAD and nonhealing left heel wound.  He was there for follow-up of left SFA occlusion.  He had worsening pain and appearance of the wound there was concern for early sepsis  given nausea and vomiting this morning and referred him to the ED for possible IV antibiotics and x-rays.  High risk for left AKA. --------------------------------- I ordered, reviewed and interpreted labs which include: CBC shows mild extensions of 11.1, hemoglobin of 9.6 appears baseline. CMP shows slight elevation of creatinine at 1.39, no emergent Electra derangement, LFT elevation or gap. Lactic 0.8 reassuring. Blood cultures pending.  DG Left Foot:    IMPRESSION:  1. Open wound on the plantar aspect of the heel.  2. No definite plain film findings for osteomyelitis.  3. Advanced osteoporosis and degenerative changes.   Patient does not appear septic at this time.  Overall he is well-appearing and in no acute distress vital  signs within normal limits.  No abdominal tenderness to suggest intra-abdominal/pelvic infection at this time.  Patient has a pedal pulse on Doppler.  Will consult vascular surgery for their input as they sent him to the emergency department today.  I spoke with vascular surgery at 12:37 PM and they advised that they will be by to round on patient. - Patient reassessed resting comfortably no acute distress reports he is feeling well he has no current complaints or concerns. - Awaiting vascular surgery recommendations, care handoff give to Dr. Paula Libra, plan of care is to follow-up on vascular recommendations. --------------------- Dr. Renaye Rakers spoke with Dr. Darrick Penna, asked for hospitalist admission.  Consult placed.  Screening Covid test ordered.  Discussed with Dr. Thedore Mins spoke with Dr. Thedore Mins, patient has been accepted to hospitalist service.  They have asked for a screening EKG.  Note: Portions of this report may have been transcribed using voice recognition software. Every effort was made to ensure accuracy; however, inadvertent computerized transcription errors may still be present. Final Clinical Impression(s) / ED Diagnoses Final diagnoses:  None    Rx / DC Orders ED Discharge Orders    None       Elizabeth Palau 06/18/20 1521    Elizabeth Palau 06/18/20 1541    Terald Sleeper, MD 06/18/20 1744

## 2020-06-18 NOTE — ED Notes (Signed)
Brother in law would like updates, number is in the chart

## 2020-06-18 NOTE — ED Triage Notes (Signed)
Pt sent here from MD office with concern for sepsis. Pt with necrotic L foot. VSS.

## 2020-06-18 NOTE — Progress Notes (Signed)
HISTORY AND PHYSICAL     CC:  follow up. Requesting Provider:  No ref. provider found  HPI: This is a 74 y.o. male who is here today for follow up for a left heel ulcer.  He has hx of right AKA with SFA stenosis.  He is followed by Dr. [name].   He last saw him on 05/25/2020.  He discussed proceeding with left AKA vs angiogram with possible stent of the left SFA and hopeful for wound healing.  He did not want to proceed with amputation as he uses that leg for transfers.  He was scheduled for August.   The pt returns today for a check of his heel.  HH called stating pt was c/o pain, foul odor but no fever.  The patient's past vascular history includes bilateral occluded superficial femoral arteries noted on arteriogram dated November 01, 2018.  At that time, he had two-vessel runoff of the left lower extremity.  He had a right heel ulcer and was counseled regarding need for femoral to popliteal bypass.  This was arranged, however the patient presented with sepsis and had to undergo right above-the-knee amputation.   Pt is accompanied by his brother in law and pt's wife on the telephone.  Pt tells me he has been nauseated and vomiting for a couple of days not being able to keep food down.  He states that he has also had some diarrhea.  His wife and brother tell me this is uncharacteristic of his as he likes to eat.  Pt's brother in law has seen this heel wound since the beginning and states it has definitely gotten worse.  He has not had fevers.    The pt is on a statin for cholesterol management.    The pt is on an aspirin.    Other AC:  none The pt is on ARB, CCB, BB for hypertension.  The pt does have diabetes. Tobacco hx:  former   Past Medical History:  Diagnosis Date  . Amputee   . Aortic atherosclerosis (HCC)   . Arthritis    "joints; shoulders, knees, hands, back" (05/21/2018)  . Atherosclerosis of coronary artery   . C. difficile diarrhea 04/2018  . Diastolic CHF (HCC)   .  Diverticulosis   . High cholesterol   . History of gout   . Hypertension   . IDA (iron deficiency anemia)    from referral Dr Darleene Cleaver  . Internal hemorrhoids   . Peripheral vascular disease (HCC)   . Pneumonia    "couple times" (05/21/2018)  . Sleep apnea    "has mask; won't use" (05/21/2018)  . Type II diabetes mellitus (HCC)     Past Surgical History:  Procedure Laterality Date  . ABDOMINAL AORTOGRAM W/LOWER EXTREMITY N/A 11/01/2018   Procedure: ABDOMINAL AORTOGRAM W/LOWER EXTREMITY;  Surgeon: Runell Gess, MD;  [facility];  Service: Cardiovascular;  Laterality: N/A;  . AMPUTATION Right 11/09/2018   Procedure: RIGHT - AMPUTATION ABOVE KNEE;  Surgeon: Maeola Harman, MD;  Location: [facility] OR;  Service: Vascular;  Laterality: Right;  . CATARACT EXTRACTION W/ INTRAOCULAR LENS  IMPLANT, BILATERAL Bilateral   . CHOLECYSTECTOMY  05/21/2018   ATTEMPTED LAPAROSCOPIC CHOLECYSTECTOMY, OPEN DRAINAGE OF GALLBLADDER WITH BIOPSY  . CHOLECYSTECTOMY N/A 05/21/2018   Procedure: ATTEMPTED LAPAROSCOPIC CHOLECYSTECTOMY, OPEN DRAINAGE OF GALLBLADDER WITH BIOPSY;  Surgeon: Griselda Miner, MD;  Location: [facility] OR;  Service: General;  Laterality: N/A;  . COLONOSCOPY W/ POLYPECTOMY    .  COLONOSCOPY WITH PROPOFOL N/A 09/16/2018   Procedure: COLONOSCOPY WITH PROPOFOL;  Surgeon: Charlott RakesSchooler, Vincent, MD;  Location: [facility];  Service: Endoscopy;  Laterality: N/A;  . 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 FLEXIBLE SIGMOIDOSCOPY N/A 04/20/2020   Procedure: FLEXIBLE SIGMOIDOSCOPY;  Surgeon: Meridee ScoreMansouraty, Netty StarringGabriel Jr., MD;  [facility];  Service: Gastroenterology;  Laterality: N/A;  Fecal Disimpaction  . IR CATHETER TUBE CHANGE  04/14/2018  . IR CHOLANGIOGRAM EXISTING TUBE  03/17/2018  . IR PERC CHOLECYSTOSTOMY  01/31/2018  . IR RADIOLOGIST EVAL & MGMT  03/02/2018     No Known Allergies  Current Outpatient Medications  Medication Sig Dispense Refill  . acetaminophen (TYLENOL) 500 MG tablet Take 1,000 mg by mouth every 6 (six) hours as needed for moderate pain or headache.    Marland Kitchen. amLODipine (NORVASC) 10 MG tablet Take 1 tablet (10 mg total) by mouth daily. 30 tablet 0  . aspirin 81 MG tablet Take 81 mg by mouth daily.    Marland Kitchen. atorvastatin (LIPITOR) 20 MG tablet Take 1 tablet (20 mg total) by mouth daily. 30 tablet 1  . brimonidine (ALPHAGAN) 0.2 % ophthalmic solution Place 1 drop into both eyes 2 (two) times daily. 5 mL 12  . gabapentin (NEURONTIN) 100 MG capsule TAKE 1 CAPSULE (100 MG TOTAL) BY MOUTH 3 (THREE) TIMES DAILY. 90 capsule 1  . glimepiride (AMARYL) 1 MG tablet Take 1 tablet (1 mg total) by mouth daily with breakfast. 30 tablet 1  . losartan (COZAAR) 100 MG tablet Take 1 tablet (100 mg total) by mouth daily. 30 tablet 3  . metoprolol succinate (TOPROL-XL) 50 MG 24 hr tablet Take 50 mg by mouth daily.     . timolol (TIMOPTIC) 0.5 % ophthalmic solution Place 1 drop into both eyes 2 (two) times daily. 10 mL 6   No current facility-administered medications for this visit.    Family History  Problem Relation Age of Onset  . Diabetes Mother   . Hypertension Mother   . Heart disease Father   . Heart attack Father   . Diabetes Sister   . Hypertension Sister   . Diabetes Brother   . Heart disease Brother   . Hypertension Brother   . Heart attack Brother     Social History   Socioeconomic History  . Marital status: Married    Spouse name: Not on file  . Number of children: 2  . Years of education: Not on file  . Highest education level: Not on file  Occupational History  . Occupation: retired  Tobacco Use  . Smoking status: Former Smoker    Years: 24.00    Types: Cigarettes    Quit date: 11/25/1983    Years since quitting: 36.5  . Smokeless tobacco: Never Used  Vaping Use  . Vaping Use: Never used  Substance and Sexual Activity    . Alcohol use: Not Currently  . Drug use: No  . Sexual activity: Not on file  Other Topics Concern  . Not on file  Social History Narrative  . Not on file   Social Determinants of Health   Financial Resource Strain:   . Difficulty of Paying Living Expenses:   Food Insecurity:   . Worried About Programme researcher, broadcasting/film/videounning Out of Food in the Last Year:   . Baristaan Out of Food in the Last Year:   Transportation Needs:   . Lack  of Transportation (Medical):   Marland Kitchen Lack of Transportation (Non-Medical):   Physical Activity:   . Days of Exercise per Week:   . Minutes of Exercise per Session:   Stress:   . Feeling of Stress :   Social Connections:   . Frequency of Communication with Friends and Family:   . Frequency of Social Gatherings with Friends and Family:   . Attends Religious Services:   . Active Member of Clubs or Organizations:   . Attends Banker Meetings:   Marland Kitchen Marital Status:   Intimate Partner Violence:   . Fear of Current or Ex-Partner:   . Emotionally Abused:   Marland Kitchen Physically Abused:   . Sexually Abused:      REVIEW OF SYSTEMS:   [X]  denotes positive finding, [ ]  denotes negative finding Cardiac  Comments:  Chest pain or chest pressure:    Shortness of breath upon exertion:    Short of breath when lying flat:    Irregular heart rhythm:        Vascular    Pain in calf, thigh, or hip brought on by ambulation:    Pain in feet at night that wakes you up from your sleep:     Blood clot in your veins:    Leg swelling:         Pulmonary    Oxygen at home:    Productive cough:     Wheezing:         Neurologic    Sudden weakness in arms or legs:     Sudden numbness in arms or legs:     Sudden onset of difficulty speaking or slurred speech:    Temporary loss of vision in one eye:     Problems with dizziness:         Gastrointestinal    Blood in stool:     Vomited blood:         Genitourinary    Burning when urinating:     Blood in urine:        Psychiatric     Major depression:         Hematologic    Bleeding problems:    Problems with blood clotting too easily:        Skin    Rashes or ulcers: x       Constitutional    Fever or chills:      PHYSICAL EXAMINATION:  Today's Vitals   06/18/20 0849 06/18/20 0851  BP: (!) 151/64   Pulse: 56   Temp: (!) 97.1 F (36.2 C)   TempSrc: Oral   SpO2: 98%   PainSc:  10-Worst pain ever   There is no height or weight on file to calculate BMI.   General:  WDWN in NAD; vital signs documented above Gait: Not observed-in wheel chair HENT: WNL, normocephalic Pulmonary: normal non-labored breathing , without wheezing Skin: without rashes Vascular Exam/Pulses: Left foot is warm.  Pedal pulses are not palpable Extremities: left heel wound present     Musculoskeletal: no muscle wasting or atrophy  Neurologic: A&O X 3;  No focal weakness or paresthesias are detected Psychiatric:  The pt has Normal affect.   Non-Invasive Vascular Imaging:   ABI's/TBI's on 05/11/2020: Right:  amputation Left:  0.68/0.43   Arterial duplex on 05/11/2020: Summary:  Left: Occlusion noted in the mid superficial femoral artery with  reconstitution at the adductor canal.    ASSESSMENT/PLAN:: 74 y.o. male here for follow up for  hx of left SFA occlusion with left heel wound  -pt comes in today with worsening pain in his left heel.  His brother in law tells me this wound has worsened.  He could be developing early sepsis given his nausea/vomiting and overall not feeling well.  Recommended that pt go to the ER to be evaluated and probable admission by hospitalist for IV abx and foot x-rays.  RN in our office spoke to triage nurse.   He is at high risk for left above knee amputation.    Doreatha Massed, [facility] [phone]  Clinic MD:   Darrick Penna on call MD

## 2020-06-18 NOTE — H&P (Signed)
TRH H&P   Patient Demographics:    Jeremiah [name], is a 74 y.o. male  MRN: [id number]   DOB - [birth year]  Admit Date - 06/18/2020  Outpatient Primary MD for the patient is System, Pcp Not In  Outpatient Specialists: Dr [name]    Patient coming from: Home  Chief Complaint  Patient presents with  . Foot Pain      HPI:    Jeremiah [name]  is a 74 y.o. male, with history of PAD s/p right AKA in the past, chronic diastolic CHF, dyslipidemia, hypertension, DM type II sleep apnea but not compliant with mask.  Who has been having problems with his left foot for the last several months, he apparently has had left foot pain and discomfort for several months along with ulcer on his heel, he has been following with Dr.Cain vascular surgeon in the office.  Today he went for routine hospital follow-up was seen by a midlevel provider and sent to the ER for direct admission for possible surgical intervention of his ongoing left foot pain and nonhealing left heel ulcer.  There was suspicion that patient might be septic however at this time there is no evidence of active infection or sepsis.  Patient besides dull ongoing constant left foot pain with no aggravating or relieving factors and no other associated symptoms has no other complaints, he said the pain is constant and to the point that he has not been able to sleep for the last few days.  He denies any fever chills, no chest or abdominal pain, no dysuria, no blood in stool or urine.  No focal weakness.    Review of systems:   A full 10 point Review of Systems was done, except as stated above, all other Review of Systems were negative.   With Past History of the following :     Past Medical History:  Diagnosis Date  . Amputee   . Aortic atherosclerosis (HCC)   . Arthritis    "joints; shoulders, knees, hands, back" (05/21/2018)  . Atherosclerosis of coronary artery   . C. difficile diarrhea 04/2018  . Diastolic CHF (HCC)   . Diverticulosis   . High cholesterol   . History of gout   . Hypertension   . IDA (iron deficiency anemia)    from referral Dr Darleene Cleaver  .  Internal hemorrhoids   . Peripheral vascular disease (HCC)   . Pneumonia    "couple times" (05/21/2018)  . Sleep apnea    "has mask; won't use" (05/21/2018)  . Type II diabetes mellitus (HCC)       Past Surgical History:  Procedure Laterality Date  . ABDOMINAL AORTOGRAM W/LOWER EXTREMITY N/A 11/01/2018   Procedure: ABDOMINAL AORTOGRAM W/LOWER EXTREMITY;  Surgeon: Runell Gess, MD;  [facility];  Service: Cardiovascular;  Laterality: N/A;  . AMPUTATION Right 11/09/2018   Procedure: RIGHT - AMPUTATION ABOVE KNEE;  Surgeon: Maeola Harman, MD;  Location: [facility] OR;  Service: Vascular;  Laterality: Right;  . CATARACT EXTRACTION W/ INTRAOCULAR LENS  IMPLANT, BILATERAL Bilateral   . CHOLECYSTECTOMY  05/21/2018   ATTEMPTED LAPAROSCOPIC CHOLECYSTECTOMY, OPEN DRAINAGE OF GALLBLADDER WITH BIOPSY  . CHOLECYSTECTOMY N/A 05/21/2018   Procedure: ATTEMPTED LAPAROSCOPIC CHOLECYSTECTOMY, OPEN DRAINAGE OF GALLBLADDER WITH BIOPSY;  Surgeon: Griselda Miner, MD;  Location: [facility] OR;  Service: General;  Laterality: N/A;  . COLONOSCOPY W/ POLYPECTOMY    . COLONOSCOPY WITH PROPOFOL N/A 09/16/2018   Procedure: COLONOSCOPY WITH PROPOFOL;  Surgeon: Charlott Rakes, MD;  Location: [facility];  Service: Endoscopy;  Laterality: N/A;  . 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 FLEXIBLE SIGMOIDOSCOPY N/A 04/20/2020   Procedure: FLEXIBLE SIGMOIDOSCOPY;  Surgeon: Meridee Score  Netty Starring., MD;  [facility];  Service: Gastroenterology;  Laterality: N/A;  Fecal Disimpaction  . IR CATHETER TUBE CHANGE  04/14/2018  . IR CHOLANGIOGRAM EXISTING TUBE  03/17/2018  . IR PERC CHOLECYSTOSTOMY  01/31/2018  . IR RADIOLOGIST EVAL & MGMT  03/02/2018      Social History:     Social History   Tobacco Use  . Smoking status: Former Smoker    Years: 24.00    Types: Cigarettes    Quit date: 11/25/1983    Years since quitting: 36.5  . Smokeless tobacco: Never Used  Substance Use Topics  . Alcohol use: Not Currently         Family History :     Family History  Problem Relation Age of Onset  . Diabetes Mother   . Hypertension Mother   . Heart disease Father   . Heart attack Father   . Diabetes Sister   . Hypertension Sister   . Diabetes Brother   . Heart disease Brother   . Hypertension Brother   . Heart attack Brother        Home Medications:   Prior to Admission medications   Medication Sig Start Date End Date Taking? Authorizing Provider  acetaminophen (TYLENOL) 500 MG tablet Take 1,000 mg by mouth every 6 (six) hours as needed for moderate pain or headache.   Yes [provider]  amLODipine (NORVASC) 10 MG tablet Take 1 tablet (10 mg total) by mouth daily. 12/06/18  Yes Angiulli, Mcarthur Rossetti, PA-C  aspirin 81 MG tablet Take 81 mg by mouth daily.   Yes [provider]  atorvastatin (LIPITOR) 20 MG tablet Take 1 tablet (20 mg total) by mouth daily. 12/06/18  Yes Angiulli, Mcarthur Rossetti, PA-C  brimonidine (ALPHAGAN) 0.2 % ophthalmic solution Place 1 drop into both eyes 2 (two) times daily. 12/06/18  Yes Angiulli, Mcarthur Rossetti, PA-C  gabapentin (NEURONTIN) 100 MG capsule TAKE 1 CAPSULE (100 MG TOTAL) BY MOUTH 3 (THREE) TIMES DAILY. 09/13/19  Yes Marcello Fennel, MD  glimepiride (AMARYL) 1 MG tablet Take 1 tablet (1 mg total) by mouth daily with breakfast. 12/06/18  Yes Angiulli, Mcarthur Rossettianiel J, PA-C  losartan (COZAAR) 100 MG tablet Take 1 tablet (100 mg  total) by mouth daily. 12/06/18  Yes Angiulli, Mcarthur Rossettianiel J, PA-C  metoprolol succinate (TOPROL-XL) 50 MG 24 hr tablet Take 50 mg by mouth daily.  03/18/19  Yes [provider]  timolol (TIMOPTIC) 0.5 % ophthalmic solution Place 1 drop into both eyes 2 (two) times daily. 12/06/18  Yes Angiulli, Mcarthur Rossettianiel J, PA-C  traMADol (ULTRAM) 50 MG tablet Take 50 mg by mouth every 8 (eight) hours as needed for moderate pain.    Yes [provider]  glucose blood test strip Accu-Chek Aviva Plus test strips  Take 1 strip 3 times a day by miscell. route for 90 days.    [provider]  glucose blood test strip Accu-Chek Aviva Plus test strips  USE AS DIRECTED THREE TIMES DAILY.    [provider]     Allergies:    No Known Allergies   Physical Exam:   Vitals  Blood pressure (!) 189/73, pulse 65, temperature 98 F (36.7 C), temperature source Oral, resp. rate 18, SpO2 100 %.   1. General early African-American male lying in hospital bed in no distress,  2. Normal affect and insight, Not Suicidal or Homicidal, Awake Alert,   3. No F.N deficits, ALL C.Nerves Intact, Strength 5/5 all 4 extremities, Sensation intact all 4 extremities, Plantars down going.  4. Ears and Eyes appear Normal, Conjunctivae clear, PERRLA. Moist Oral Mucosa.  5. Supple Neck, No JVD, No cervical lymphadenopathy appriciated, No Carotid Bruits.  6. Symmetrical Chest wall movement, Good air movement bilaterally, CTAB.  7. RRR, No Gallops, positive chronic loud systolic murmur, No Parasternal Heave.  8. Positive Bowel Sounds, Abdomen Soft, No tenderness, No organomegaly appriciated,No rebound -guarding or rigidity.  9.  No Cyanosis, Normal Skin Turgor, No Skin Rash or Bruise.  10. Good muscle tone,  joints appear normal , no effusions, Normal ROM.  11. No Palpable Lymph Nodes in Neck or Axillae, left foot it is mildly cold, devoid of any hair, no palpable pulsations however good sensation, dry  1-1/2 cm chronic unstageable left heel ulcer with eschar at the base, right AKA      Data Review:    CBC Recent Labs  Lab 06/18/20 1041  WBC 11.1*  HGB 9.6*  HCT 29.9*  PLT 255  MCV 83.8  MCH 26.9  MCHC 32.1  RDW 14.5  LYMPHSABS 1.8  MONOABS 0.6  EOSABS 1.1*  BASOSABS 0.1   ------------------------------------------------------------------------------------------------------------------  Chemistries  Recent Labs  Lab 06/18/20 1041  NA 136  K 3.6  CL 101  CO2 25  GLUCOSE 111*  BUN 17  CREATININE 1.39*  CALCIUM 9.0  AST 14*  ALT 15  ALKPHOS 99  BILITOT 0.7   ------------------------------------------------------------------------------------------------------------------ CrCl cannot be calculated (Unknown ideal weight.). ------------------------------------------------------------------------------------------------------------------ No results for input(s): TSH, T4TOTAL, T3FREE, THYROIDAB in the last 72 hours.  Invalid input(s): FREET3  Coagulation profile No results for input(s): INR, PROTIME in the last 168 hours. ------------------------------------------------------------------------------------------------------------------- No results for input(s): DDIMER in the last 72 hours. -------------------------------------------------------------------------------------------------------------------  Cardiac Enzymes No results for input(s): CKMB, TROPONINI, MYOGLOBIN in the last 168 hours.  Invalid input(s): CK ------------------------------------------------------------------------------------------------------------------    Component Value Date/Time   BNP 499.2 (H) 07/07/2018 0850     ---------------------------------------------------------------------------------------------------------------  Urinalysis    Component Value Date/Time   COLORURINE YELLOW 11/08/2018  1735   APPEARANCEUR CLEAR 11/08/2018 1735   LABSPEC 1.018 11/08/2018 1735    PHURINE 5.0 11/08/2018 1735   GLUCOSEU NEGATIVE 11/08/2018 1735   HGBUR NEGATIVE 11/08/2018 1735   BILIRUBINUR NEGATIVE 11/08/2018 1735   KETONESUR NEGATIVE 11/08/2018 1735   PROTEINUR 100 (A) 11/08/2018 1735   UROBILINOGEN 0.2 01/09/2009 1532   NITRITE NEGATIVE 11/08/2018 1735   LEUKOCYTESUR NEGATIVE 11/08/2018 1735    ----------------------------------------------------------------------------------------------------------------   Imaging Results:    DG Foot Complete Left  Result Date: 06/18/2020 CLINICAL DATA:  Diabetic foot ulcer. EXAM: LEFT FOOT - COMPLETE 3+ VIEW COMPARISON:  04/11/2020 FINDINGS: Open wound noted on the plantar aspect of the heel. No evidence of underlying osteomyelitis involving the calcaneus. Advanced osteoporosis noted and there are moderate forefoot, midfoot and hindfoot degenerative changes. No definite destructive bony lesions. Advanced vascular calcifications are noted. IMPRESSION: 1. Open wound on the plantar aspect of the heel. 2. No definite plain film findings for osteomyelitis. 3. Advanced osteoporosis and degenerative changes. Electronically Signed   By: Rudie Meyer M.D.   On: 06/18/2020 10:58    EKG, chest x-ray and echocardiogram ordered.   Assessment & Plan:      1.  Pain, nonhealing unstageable left heel chronic ulcer present on admission in a patient with advanced PAD.  Currently no signs of infection or sepsis, he will be admitted to the hospital, will obtain baseline CRP and procalcitonin.  Have discussed his case with Dr. Darrick Penna vascular surgeon who will see the patient shortly and formulate a plan.  Will obtain a baseline echocardiogram as well as he has a loud murmur along with EKG and chest x-ray before any surgery.  Avoid unnecessary use of antibiotics due to previous history of C. difficile.  2.  PAD.  Continue aspirin and statin for secondary prevention along with hypertension and diabetes control.  3.  Essential hypertension.  Placed  on Norvasc, home dose Toprol along with hydralazine.  4.  Dyslipidemia home dose Lipitor continue.  5.  Right AKA.  Has a prosthesis.  Supportive care.  6.  DM type II.  Check A1c, hold oral hypoglycemic agents, low-dose Lantus along with sliding scale and Premeal NovoLog.  Monitor and adjust.  7.  Loud systolic heart murmur.  Repeat echocardiogram previous echo few years ago showed mild AS.  We will also get baseline EKG and chest x-ray.  8.  Chronic diastolic CHF EF 55% on last echocardiogram done in 2019.  Currently compensated repeating echocardiogram to rule out systolic murmur.     DVT Prophylaxis Heparin   AM Labs Ordered, also please review Full Orders  Family Communication: Admission, patients condition and plan of care including tests being ordered have been discussed with the patient   who indicates understanding and agree with the plan and Code Status.  Code Status Full  Likely DC to  TBD  Condition Fair  Consults called: VVS    Admission status: Inpt    Time spent in minutes : 35   Susa Raring M.D on 06/18/2020 at 4:06 PM  To page go to [email or web address] - password [email or web address]

## 2020-06-18 NOTE — Consult Note (Signed)
Patient seen and examined.  Briefly he is a 74 year old male who was seen earlier in the day today by our PA Doreatha Massed.  He has a gangrenous left heel.  He has not walked in several months although he tried to walk with an AKA prosthesis in the past.  He was seen by Dr. [name] on May 25, 2020.  He was scheduled for an arteriogram in August.  However, the wound has become worse over time.  He has a known occluded left superficial femoral artery.  On my exam of the left foot wound he has a 3 x 3 cm area of dark eschar it is slightly dry no obvious drainage no surrounding erythema.  He has a left femoral pulse but no popliteal pulse.  He has no pedal pulses.  Plan: Patient will be scheduled for aortogram lower extremity runoff possible intervention by my partner Dr. Chestine Spore on Wednesday, July 28.  I discussed with the patient and his son today that he is very high risk for limb loss due to the location of the wound and that these are notoriously difficult to get the heel.  They understand and agree to proceed.  Fabienne Bruns, MD [facility] [place] Office: [phone]

## 2020-06-19 ENCOUNTER — Encounter (HOSPITAL_COMMUNITY): Payer: Self-pay | Admitting: Internal Medicine

## 2020-06-19 ENCOUNTER — Inpatient Hospital Stay (HOSPITAL_COMMUNITY): Payer: Medicare Other

## 2020-06-19 DIAGNOSIS — I96 Gangrene, not elsewhere classified: Secondary | ICD-10-CM | POA: Diagnosis not present

## 2020-06-19 DIAGNOSIS — I5031 Acute diastolic (congestive) heart failure: Secondary | ICD-10-CM

## 2020-06-19 LAB — COMPREHENSIVE METABOLIC PANEL
ALT: 12 U/L (ref 0–44)
AST: 13 U/L — ABNORMAL LOW (ref 15–41)
Albumin: 2.4 g/dL — ABNORMAL LOW (ref 3.5–5.0)
Alkaline Phosphatase: 88 U/L (ref 38–126)
Anion gap: 9 (ref 5–15)
BUN: 17 mg/dL (ref 8–23)
CO2: 26 mmol/L (ref 22–32)
Calcium: 8.6 mg/dL — ABNORMAL LOW (ref 8.9–10.3)
Chloride: 103 mmol/L (ref 98–111)
Creatinine, Ser: 1.45 mg/dL — ABNORMAL HIGH (ref 0.61–1.24)
GFR calc Af Amer: 54 mL/min — ABNORMAL LOW (ref >60)
GFR calc non Af Amer: 46 mL/min — ABNORMAL LOW (ref >60)
Glucose, Bld: 94 mg/dL (ref 70–99)
Potassium: 3.3 mmol/L — ABNORMAL LOW (ref 3.5–5.1)
Sodium: 138 mmol/L (ref 135–145)
Total Bilirubin: 0.4 mg/dL (ref 0.3–1.2)
Total Protein: 7 g/dL (ref 6.5–8.1)

## 2020-06-19 LAB — CBC WITH DIFFERENTIAL/PLATELET
Abs Immature Granulocytes: 0.02 10*3/uL (ref 0.00–0.07)
Basophils Absolute: 0.1 10*3/uL (ref 0.0–0.1)
Basophils Relative: 1 %
Eosinophils Absolute: 1.7 10*3/uL — ABNORMAL HIGH (ref 0.0–0.5)
Eosinophils Relative: 16 %
HCT: 25.4 % — ABNORMAL LOW (ref 39.0–52.0)
Hemoglobin: 8.4 g/dL — ABNORMAL LOW (ref 13.0–17.0)
Immature Granulocytes: 0 %
Lymphocytes Relative: 21 %
Lymphs Abs: 2.1 10*3/uL (ref 0.7–4.0)
MCH: 27.3 pg (ref 26.0–34.0)
MCHC: 33.1 g/dL (ref 30.0–36.0)
MCV: 82.5 fL (ref 80.0–100.0)
Monocytes Absolute: 0.9 10*3/uL (ref 0.1–1.0)
Monocytes Relative: 9 %
Neutro Abs: 5.4 10*3/uL (ref 1.7–7.7)
Neutrophils Relative %: 53 %
Platelets: 239 10*3/uL (ref 150–400)
RBC: 3.08 MIL/uL — ABNORMAL LOW (ref 4.22–5.81)
RDW: 14.7 % (ref 11.5–15.5)
WBC: 10.1 10*3/uL (ref 4.0–10.5)
nRBC: 0 % (ref 0.0–0.2)

## 2020-06-19 LAB — ECHOCARDIOGRAM COMPLETE
AR max vel: 1.55 cm2
AV Area VTI: 1.34 cm2
AV Area mean vel: 1.3 cm2
AV Mean grad: 23.8 mmHg
AV Peak grad: 42.7 mmHg
Ao pk vel: 3.27 m/s
Area-P 1/2: 2.56 cm2
Calc EF: 66.6 %
Height: 69 in
S' Lateral: 2.5 cm
Single Plane A2C EF: 71.4 %
Single Plane A4C EF: 66.2 %
Weight: 2560 oz

## 2020-06-19 LAB — GLUCOSE, CAPILLARY
Glucose-Capillary: 102 mg/dL — ABNORMAL HIGH (ref 70–99)
Glucose-Capillary: 109 mg/dL — ABNORMAL HIGH (ref 70–99)
Glucose-Capillary: 152 mg/dL — ABNORMAL HIGH (ref 70–99)
Glucose-Capillary: 64 mg/dL — ABNORMAL LOW (ref 70–99)
Glucose-Capillary: 89 mg/dL (ref 70–99)

## 2020-06-19 LAB — MAGNESIUM: Magnesium: 1.8 mg/dL (ref 1.7–2.4)

## 2020-06-19 LAB — PROCALCITONIN: Procalcitonin: <0.1 ng/mL

## 2020-06-19 LAB — C-REACTIVE PROTEIN: CRP: 2.9 mg/dL — ABNORMAL HIGH (ref <1.0)

## 2020-06-19 LAB — BRAIN NATRIURETIC PEPTIDE: B Natriuretic Peptide: 175.2 pg/mL — ABNORMAL HIGH (ref 0.0–100.0)

## 2020-06-19 MED ORDER — LACTATED RINGERS IV SOLN: INTRAVENOUS | Status: DC

## 2020-06-19 MED ORDER — POTASSIUM CHLORIDE CRYS ER 20 MEQ PO TBCR
40.0000 meq | EXTENDED_RELEASE_TABLET | Freq: Two times a day (BID) | ORAL | Status: AC
  Administered 2020-06-19 (×2): 40 meq via ORAL
  Filled 2020-06-19 (×2): qty 2

## 2020-06-19 NOTE — Progress Notes (Signed)
  Echocardiogram 2D Echocardiogram has been performed.  Jeremiah [name] 06/19/2020, 11:55 AM

## 2020-06-19 NOTE — Addendum Note (Signed)
Addended by: Primitivo Gauze on: 06/19/2020 12:24 PM   Modules accepted: Orders

## 2020-06-19 NOTE — Evaluation (Signed)
Physical Therapy Evaluation Patient Details Name: Jeremiah [name] MRN: [id number] DOB: 01/15/1946 Today's Date: 06/19/2020   History of Present Illness  Sherod Cisse  is a 74 y.o. male, with history of PAD s/p right AKA in the past, chronic diastolic CHF, dyslipidemia, hypertension, DM type II sleep apnea but not compliant with mask.  Who has been having problems with his left foot for the last several months, he apparently has had left foot pain and discomfort for several months along with ulcer on his heel  Clinical Impression  Pt admitted with above diagnosis. Pt presents with profound weakness LLE. This and L foot pain has limited pt's ability to stand on LLE past several months. Pt able to scoot into recliner with sliding board with mod A. Pt's family has been helping him with this at home PTA and expect he will be able to return home with family support and HHPT.  Pt currently with functional limitations due to the deficits listed below (see PT Problem List). Pt will benefit from skilled PT to increase their independence and safety with mobility to allow discharge to the venue listed below.       Follow Up Recommendations Home health PT;Supervision for mobility/OOB    Equipment Recommendations  None recommended by PT    Recommendations for Other Services       Precautions / Restrictions Precautions Precautions: Fall Restrictions Weight Bearing Restrictions: No      Mobility  Bed Mobility Overal bed mobility: Needs Assistance Bed Mobility: Supine to Sit     Supine to sit: Mod assist;+2 for safety/equipment     General bed mobility comments: mod A for hips to EOB and full elevation of trunk to sitting, cues for pt to reach to foot of bed to wt shift to L  Transfers Overall transfer level: Needs assistance Equipment used: Sliding board Transfers: Lateral/Scoot Transfers          Lateral/Scoot Transfers: Mod assist;+2 safety/equipment;With slide board General  transfer comment: pt scooted to R because drop arm recliner had only L dropping arm. Was unable to use LLE much to help push, mostly UE's. Mod A given to scoot hips.   Ambulation/Gait             General Gait Details: unable  Stairs            Wheelchair Mobility    Modified Rankin (Stroke Patients Only)       Balance Overall balance assessment: Needs assistance Sitting-balance support: Bilateral upper extremity supported Sitting balance-Leahy Scale: Fair Sitting balance - Comments: able to wt shift for SB to be placed but limitations evident with initial sitting                                     Pertinent Vitals/Pain Pain Assessment: No/denies pain    Home Living Family/patient expects to be discharged to:: Private residence Living Arrangements: Spouse/significant other Available Help at Discharge: Family;Available 24 hours/day Type of Home: House Home Access: Ramped entrance     Home Layout: One level Home Equipment: Walker - 2 wheels;Wheelchair - [employer];Shower seat;Cane - single point;Other (comment) (sliding board) Additional Comments: pt's wife is bilateral amputee but brother and children help pt and wife daily    Prior Function Level of Independence: Needs assistance   Gait / Transfers Assistance Needed: SB transfers for past several months  ADL's / Homemaking Assistance Needed: assist needed  Comments: was having a HHA for BADLs; son provides meals; pt was [place] with OP PT for prosthetic therapy     Hand Dominance   Dominant Hand: Right    Extremity/Trunk Assessment   Upper Extremity Assessment Upper Extremity Assessment: Defer to OT evaluation    Lower Extremity Assessment Lower Extremity Assessment: LLE deficits/detail LLE Deficits / Details: hip flex 2-/5, LLE very weak, unable to support body wt LLE Sensation: decreased light touch;decreased proprioception;history of peripheral neuropathy LLE  Coordination: decreased gross motor;decreased fine motor    Cervical / Trunk Assessment Cervical / Trunk Assessment: Normal  Communication   Communication: No difficulties  Cognition Arousal/Alertness: Awake/alert Behavior During Therapy: WFL for tasks assessed/performed Overall Cognitive Status: Within Functional Limits for tasks assessed                                 General Comments: able to answer cog questions but somewhat distracted by environment including fear of IV getting pulled out      General Comments General comments (skin integrity, edema, etc.): VSS    Exercises General Exercises - Lower Extremity Ankle Circles/Pumps: AROM;Left;15 reps;Seated Quad Sets: AROM;Left;10 reps;Seated   Assessment/Plan    PT Assessment Patient needs continued PT services  PT Problem List Decreased strength;Decreased activity tolerance;Decreased balance;Decreased mobility;Decreased skin integrity;Impaired sensation       PT Treatment Interventions DME instruction;Functional mobility training;Therapeutic activities;Therapeutic exercise;Balance training;Patient/family education    PT Goals (Current goals can be found in the Care Plan section)  Acute Rehab PT Goals Patient Stated Goal: return home PT Goal Formulation: With patient Time For Goal Achievement: 07/03/20 Potential to Achieve Goals: Good    Frequency Min 3X/week   Barriers to discharge        Co-evaluation PT/OT/SLP Co-Evaluation/Treatment: Yes Reason for Co-Treatment: Complexity of the patient's impairments (multi-system involvement);For patient/therapist safety PT goals addressed during session: Mobility/safety with mobility;Balance;Proper use of DME         AM-PAC PT "6 Clicks" Mobility  Outcome Measure Help needed turning from your back to your side while in a flat bed without using bedrails?: A Little Help needed moving from lying on your back to sitting on the side of a flat bed without  using bedrails?: A Little Help needed moving to and from a bed to a chair (including a wheelchair)?: A Little Help needed standing up from a chair using your arms (e.g., wheelchair or bedside chair)?: Total Help needed to walk in hospital room?: Total Help needed climbing 3-5 steps with a railing? : Total 6 Click Score: 12    End of Session   Activity Tolerance: Patient tolerated treatment well Patient left: in chair;with call bell/phone within reach;with family/visitor present;with chair alarm set Nurse Communication: Mobility status PT Visit Diagnosis: Muscle weakness (generalized) (M62.81)    Time: 0347-4259 PT Time Calculation (min) (ACUTE ONLY): 28 min   Charges:   PT Evaluation $PT Eval Moderate Complexity: 1 Mod          Lyanne Co, PT  Acute Rehab Services  Pager [phone] Office [phone]   Lawana Chambers Johnchristopher Sarvis 06/19/2020, 3:51 PM

## 2020-06-19 NOTE — Plan of Care (Addendum)
Pt stable on arrival to floor. Pt currently has no complaints of pain. Foot ulcer dressed with with kerlix and guaze.     Problem: Education: Goal: Knowledge of General Education information will improve Description: Including pain rating scale, medication(s)/side effects and non-pharmacologic comfort measures Outcome: Progressing   Problem: Activity: Goal: Risk for activity intolerance will decrease Outcome: Progressing   Problem: Pain Managment: Goal: General experience of comfort will improve Outcome: Progressing   Problem: Safety: Goal: Ability to remain free from injury will improve Outcome: Progressing   Problem: Skin Integrity: Goal: Risk for impaired skin integrity will decrease Outcome: Progressing

## 2020-06-19 NOTE — Progress Notes (Signed)
PROGRESS NOTE  Jeremiah [name] GBT:[id number] DOB: 1946/10/26 DOA: 06/18/2020 PCP: System, Pcp Not In  HPI/Recap of past 24 hours:  Jeremiah [name]  is a 74 y.o. male, with history of PAD s/p right AKA in the past, chronic diastolic CHF, dyslipidemia, hypertension, DM type II sleep apnea but not compliant with mask.  Who has been having problems with his left foot for the last several months, he apparently has had left foot pain and discomfort for several months along with ulcer on his heel, he has been following with Dr.Cain vascular surgeon in the office.  Today he went for routine hospital follow-up was seen by a midlevel provider and sent to the ER for direct admission for possible surgical intervention of his ongoing left foot pain and nonhealing left heel ulcer.  There was suspicion that patient might be septic however at this time there is no evidence of active infection or sepsis.  Patient besides dull ongoing constant left foot pain with no aggravating or relieving factors and no other associated symptoms has no other complaints, he said the pain is constant and to the point that he has not been able to sleep for the last few days.  He denies any fever chills, no chest or abdominal pain, no dysuria, no blood in stool or urine.  No focal weakness.  06/19/20: Seen and examined at his bedside.  He has no new complaint.  He denies any pain.  Seen by vascular surgery, plan is for left lower extremity arteriogram likely tomorrow 06/20/2020 due to critical limb ischemia.  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 Darrick Penna will discuss procedure with family and provide schedule information.  N.p.o. after midnight  Continue IV fluid hydration   Severe PAD.  Continue aspirin and statin for secondary prevention along with hypertension and diabetes control. Planned procedure by vascular surgery tomorrow Continue IV fluid to avoid contrast-induced renal insufficiency  Essential hypertension.  Placed on Norvasc, continue home dose Toprol along with hydralazine.  Dyslipidemia home dose Lipitor continue.  Right AKA.  Has a prosthesis.  Supportive care.  DM type II.  Check A1c, hold oral hypoglycemic agents, low-dose Lantus along with sliding scale and Premeal NovoLog.  Monitor and adjust.  Loud systolic heart murmur.  Repeat echocardiogram previous echo few years ago showed mild AS.  We will also get baseline EKG and chest x-ray.  Chronic diastolic CHF EF 55% on last echocardiogram done in 2019.   2D echo done on 06/19/20 shows LVEF 70 to 75% with moderate left ventricular hypertrophy, grade 1 diastolic dysfunction and moderate aortic valve stenosis. Continue strict I's and O's and daily weight Continue current medications Monitor volume status while on IV fluid    DVT Prophylaxis Heparin subcu 3 times daily    Family Communication:  None at bedside.  Code Status Full   Status is: Inpatient    Dispo:  Patient From: Home  Planned Disposition: Home with Health Care Svc  Expected discharge date: 06/21/20  Medically stable  for discharge: No         Objective: Vitals:   06/19/20 0907 06/19/20 1046 06/19/20 1344 06/19/20 1406  BP: (!) 129/52  (!) 148/55 (!) 153/63  Pulse: 57  60 62  Resp:    17  Temp:    99.4 F (37.4 C)  TempSrc:    Oral  SpO2:    98%  Weight:  72.6 kg    Height:        Intake/Output Summary (Last 24 hours) at  06/19/2020 1539 Last data filed at 06/19/2020 8242 Gross per 24 hour  Intake 32.38 ml  Output --  Net 32.38 ml   Filed Weights   06/19/20 1046  Weight: 72.6 kg    Exam:  . General: 74 y.o. year-old male well developed well nourished in no acute distress.  Alert and oriented x3.  Very hard of hearing. . Cardiovascular: Regular rate and rhythm with no rubs or gallops.  No thyromegaly or JVD noted.   Marland Kitchen Respiratory: Clear to auscultation with no wheezes or rales. Good inspiratory effort. . Abdomen: Soft nontender nondistended with normal bowel sounds x4 quadrants. . Musculoskeletal: Left heel in surgical dressing.   Marland Kitchen Psychiatry: Mood is appropriate for condition and setting   Data Reviewed: CBC: Recent Labs  Lab 06/18/20 1041 06/18/20 1625 06/19/20 0303  WBC 11.1* 11.5* 10.1  NEUTROABS 7.5  --  5.4  HGB 9.6* 9.4* 8.4*  HCT 29.9* 30.0* 25.4*  MCV 83.8 84.0 82.5  PLT 255 239 239   Basic Metabolic Panel: Recent Labs  Lab 06/18/20 1041 06/18/20 1559 06/18/20 1625 06/19/20 0303  NA 136  --   --  138  K 3.6  --   --  3.3*  CL 101  --   --  103  CO2 25  --   --  26  GLUCOSE 111*  --   --  94  BUN 17  --   --  17  CREATININE 1.39*  --  1.31* 1.45*  CALCIUM 9.0  --   --  8.6*  MG  --  1.9  --  1.8   GFR: Estimated Creatinine Clearance: 43.3 mL/min (A) (by C-G formula based on SCr of 1.45 mg/dL (H)). Liver Function Tests: Recent Labs  Lab 06/18/20 1041 06/19/20 0303  AST 14* 13*  ALT 15 12  ALKPHOS 99 88  BILITOT 0.7 0.4  PROT 7.8 7.0  ALBUMIN 2.7* 2.4*   No results for input(s): LIPASE, AMYLASE in the last 168 hours. No results for input(s): AMMONIA in the last 168 hours. Coagulation Profile: Recent Labs  Lab 06/18/20 1559  INR 1.1   Cardiac Enzymes: No results for input(s): CKTOTAL, CKMB, CKMBINDEX, TROPONINI in the last 168 hours. BNP (last 3 results) No results for input(s): PROBNP in the last 8760 hours. HbA1C: Recent Labs    06/18/20 1559   HGBA1C 6.1*   CBG: Recent Labs  Lab 06/18/20 1715 06/18/20 2119 06/19/20 0646 06/19/20 1133  GLUCAP 80 105* 89 109*   Lipid Profile: No results for input(s): CHOL, HDL, LDLCALC, TRIG, CHOLHDL, LDLDIRECT in the last 72 hours. Thyroid Function Tests: No results for input(s): TSH, T4TOTAL, FREET4, T3FREE, THYROIDAB in the last 72 hours. Anemia Panel: No results for input(s): VITAMINB12, FOLATE, FERRITIN, TIBC, IRON, RETICCTPCT in the last 72 hours. Urine analysis:    Component Value Date/Time   COLORURINE YELLOW 11/08/2018 1735   APPEARANCEUR CLEAR 11/08/2018 1735   LABSPEC 1.018 11/08/2018 1735   PHURINE 5.0  11/08/2018 1735   GLUCOSEU NEGATIVE 11/08/2018 1735   HGBUR NEGATIVE 11/08/2018 1735   BILIRUBINUR NEGATIVE 11/08/2018 1735   KETONESUR NEGATIVE 11/08/2018 1735   PROTEINUR 100 (A) 11/08/2018 1735   UROBILINOGEN 0.2 01/09/2009 1532   NITRITE NEGATIVE 11/08/2018 1735   LEUKOCYTESUR NEGATIVE 11/08/2018 1735   Sepsis Labs: @LABRCNTIP (procalcitonin:4,lacticidven:4)  ) Recent Results (from the past 240 hour(s))  Blood culture (routine x 2)     Status: None (Preliminary result)   Collection Time: 06/18/20  1:00 PM   Specimen: BLOOD LEFT HAND  Result Value Ref Range Status   Specimen Description BLOOD LEFT HAND  Final   Special Requests   Final    BOTTLES DRAWN AEROBIC ONLY Blood Culture results may not be optimal due to an inadequate volume of blood received in culture bottles   Culture   Final    NO GROWTH < 24 HOURS Performed at [facility], [address] [place], [place] [address]    Report Status PENDING  Incomplete  Blood culture (routine x 2)     Status: None (Preliminary result)   Collection Time: 06/18/20  1:07 PM   Specimen: BLOOD  Result Value Ref Range Status   Specimen Description BLOOD SITE NOT SPECIFIED  Final   Special Requests   Final    BOTTLES DRAWN AEROBIC ONLY Blood Culture adequate volume   Culture   Final    NO GROWTH < 24  HOURS Performed at [facility], [address] [place], [place] [address]    Report Status PENDING  Incomplete  SARS Coronavirus 2 by RT PCR (hospital order, performed in [facility] hospital lab) Nasopharyngeal Nasopharyngeal Swab     Status: None   Collection Time: 06/18/20  4:00 PM   Specimen: Nasopharyngeal Swab  Result Value Ref Range Status   SARS Coronavirus 2 NEGATIVE NEGATIVE Final    Comment: (NOTE) SARS-CoV-2 target nucleic acids are NOT DETECTED.  The SARS-CoV-2 RNA is generally detectable in upper and lower respiratory specimens during the acute phase of infection. The lowest concentration of SARS-CoV-2 viral copies this assay can detect is 250 copies / mL. A negative result does not preclude SARS-CoV-2 infection and should not be used as the sole basis for treatment or other patient management decisions.  A negative result may occur with improper specimen collection / handling, submission of specimen other than nasopharyngeal swab, presence of viral mutation(s) within the areas targeted by this assay, and inadequate number of viral copies (<250 copies / mL). A negative result must be combined with clinical observations, patient history, and epidemiological information.  Fact Sheet for Patients:   BoilerBrush.com.cyhttps://www.fda.gov/media/136312/download  Fact Sheet for Healthcare Providers: https://pope.com/https://www.fda.gov/media/136313/download  This test is not yet approved or  cleared by the [place] FDA and has been authorized for detection and/or diagnosis of SARS-CoV-2 by FDA under an Emergency Use Authorization (EUA).  This EUA will remain in effect (meaning this test can be used) for the duration of the COVID-19 declaration under Section 564(b)(1) of the Act, 21 U.S.C. section 360bbb-3(b)(1), unless the authorization is terminated or revoked sooner.  Performed at [facility], [address] [place], [place] [address]   MRSA PCR Screening     Status: None    Collection Time: 06/18/20  4:00 PM   Specimen: Nasopharyngeal Wash  Result Value Ref Range Status   MRSA by PCR NEGATIVE NEGATIVE Final    Comment:        The GeneXpert MRSA  Assay (FDA approved for NASAL specimens only), is one component of a comprehensive MRSA colonization surveillance program. It is not intended to diagnose MRSA infection nor to guide or monitor treatment for MRSA infections. Performed at [facility], [address] [place], [place] [address]   Surgical pcr screen     Status: None   Collection Time: 06/18/20  9:44 PM   Specimen: Nasal Mucosa; Nasal Swab  Result Value Ref Range Status   MRSA, PCR NEGATIVE NEGATIVE Final   Staphylococcus aureus NEGATIVE NEGATIVE Final    Comment: (NOTE) The Xpert SA Assay (FDA approved for NASAL specimens in patients 24 years of age and older), is one component of a comprehensive surveillance program. It is not intended to diagnose infection nor to guide or monitor treatment. Performed at [facility], [address] [place], [place] [address]       Studies: DG Chest Port 1 View  Result Date: 06/18/2020 CLINICAL DATA:  74 year old male with necrotic foot wound. Possible sepsis. EXAM: PORTABLE CHEST 1 VIEW COMPARISON:  Chest CT 11/05/2018 and earlier. FINDINGS: Portable AP upright view at 1622 hours. Chronic right pleural effusion appears not significantly changed from December 2019. Stable lung volumes. Mediastinal contours remain within normal limits. Visualized tracheal air column is within normal limits. No superimposed pneumothorax or pulmonary edema. Allowing for portable technique the left lung is clear. Negative visible bowel gas pattern. No acute osseous abnormality identified. IMPRESSION: 1. Chronic right pleural effusion appears stable since December 2019. 2. No new cardiopulmonary abnormality. Electronically Signed   By: Odessa Fleming M.D.   On: 06/18/2020 16:35   ECHOCARDIOGRAM COMPLETE  Result Date:  06/19/2020    ECHOCARDIOGRAM REPORT   Patient Name:   Jeremiah [name] Date of Exam: 06/19/2020 Medical Rec #:  [id number]       Height:       69.0 in Accession #:    [id number]      Weight:       160.0 lb Date of Birth:  March 01, 1946       BSA:          1.879 m Patient Age:    74 years        BP:           127/45 mmHg Patient Gender: M               HR:           57 bpm. Exam Location:  Inpatient Procedure: 2D Echo, Cardiac Doppler and Color Doppler Indications:    I50.31 Acute diastolic (congestive) heart failure  History:        Patient has prior history of Echocardiogram examinations, most                 recent 03/28/2018. Aortic Valve Disease, Signs/Symptoms:Altered                 Mental Status and Fever; Risk Factors:Hypertension, Diabetes and                 Dyslipidemia. Aortic stenosis.  Sonographer:    Sheralyn Boatman RDCS Referring Phys: [id number] [place] [facility]  Sonographer Comments: Technically difficult study due to poor echo windows. IMPRESSIONS  1. Left ventricular ejection fraction, by estimation, is 70 to 75%. The left ventricle has hyperdynamic function. The left ventricle has no regional wall motion abnormalities. There is moderate left ventricular hypertrophy. Left ventricular diastolic parameters are consistent with Grade I diastolic dysfunction (  impaired relaxation).  2. Right ventricular systolic function is normal. The right ventricular size is normal.  3. The mitral valve is normal in structure. No evidence of mitral valve regurgitation. No evidence of mitral stenosis.  4. The aortic valve has an indeterminant number of cusps. Aortic valve regurgitation is not visualized. Moderate aortic valve stenosis. Aortic valve area, by VTI measures 1.34 cm. Aortic valve mean gradient measures 23.8 mmHg. Aortic valve Vmax measures 3.27 m/s.  5. The inferior vena cava is normal in size with greater than 50% respiratory variability, suggesting right atrial pressure of 3 mmHg. Comparison(s): Aortic stenosis has  advanced (prior mean gradient 16 mmHg). FINDINGS  Left Ventricle: Left ventricular ejection fraction, by estimation, is 70 to 75%. The left ventricle has hyperdynamic function. The left ventricle has no regional wall motion abnormalities. The left ventricular internal cavity size was normal in size. There is moderate left ventricular hypertrophy. Left ventricular diastolic parameters are consistent with Grade I diastolic dysfunction (impaired relaxation). Right Ventricle: The right ventricular size is normal. No increase in right ventricular wall thickness. Right ventricular systolic function is normal. Left Atrium: Left atrial size was normal in size. Right Atrium: Right atrial size was normal in size. Pericardium: There is no evidence of pericardial effusion. Mitral Valve: The mitral valve is normal in structure. Normal mobility of the mitral valve leaflets. No evidence of mitral valve regurgitation. No evidence of mitral valve stenosis. Tricuspid Valve: The tricuspid valve is normal in structure. Tricuspid valve regurgitation is not demonstrated. No evidence of tricuspid stenosis. Aortic Valve: The aortic valve has an indeterminant number of cusps. . There is moderate thickening and moderate calcification of the aortic valve. Aortic valve regurgitation is not visualized. Moderate aortic stenosis is present. There is moderate thickening of the aortic valve. There is moderate calcification of the aortic valve. Aortic valve mean gradient measures 23.8 mmHg. Aortic valve peak gradient measures 42.7 mmHg. Aortic valve area, by VTI measures 1.34 cm. Pulmonic Valve: The pulmonic valve was normal in structure. Pulmonic valve regurgitation is not visualized. No evidence of pulmonic stenosis. Aorta: The aortic root is normal in size and structure. Venous: The inferior vena cava is normal in size with greater than 50% respiratory variability, suggesting right atrial pressure of 3 mmHg. IAS/Shunts: No atrial level shunt  detected by color flow Doppler.  LEFT VENTRICLE PLAX 2D LVIDd:         4.00 cm     Diastology LVIDs:         2.50 cm     LV e' lateral:   6.20 cm/s LV PW:         1.50 cm     LV E/e' lateral: 16.3 LV IVS:        1.40 cm     LV e' medial:    6.31 cm/s LVOT diam:     1.80 cm     LV E/e' medial:  16.0 LV SV:         104 LV SV Index:   55 LVOT Area:     2.54 cm  LV Volumes (MOD) LV vol d, MOD A2C: 85.2 ml LV vol d, MOD A4C: 68.9 ml LV vol s, MOD A2C: 24.4 ml LV vol s, MOD A4C: 23.3 ml LV SV MOD A2C:     60.8 ml LV SV MOD A4C:     68.9 ml LV SV MOD BP:      51.9 ml RIGHT VENTRICLE  IVC RV S prime:     12.50 cm/s  IVC diam: 1.80 cm TAPSE (M-mode): 2.9 cm LEFT ATRIUM           Index       RIGHT ATRIUM           Index LA diam:      3.20 cm 1.70 cm/m  RA Area:     11.20 cm LA Vol (A2C): 24.8 ml 13.20 ml/m RA Volume:   22.60 ml  12.03 ml/m LA Vol (A4C): 35.0 ml 18.63 ml/m  AORTIC VALVE AV Area (Vmax):    1.55 cm AV Area (Vmean):   1.30 cm AV Area (VTI):     1.34 cm AV Vmax:           326.60 cm/s AV Vmean:          226.400 cm/s AV VTI:            0.774 m AV Peak Grad:      42.7 mmHg AV Mean Grad:      23.8 mmHg LVOT Vmax:         199.00 cm/s LVOT Vmean:        116.000 cm/s LVOT VTI:          0.408 m LVOT/AV VTI ratio: 0.53  AORTA Ao Root diam: 2.80 cm MITRAL VALVE MV Area (PHT): 2.56 cm     SHUNTS MV Decel Time: 296 msec     Systemic VTI:  0.41 m MV E velocity: 101.00 cm/s  Systemic Diam: 1.80 cm MV A velocity: 130.00 cm/s MV E/A ratio:  0.78 Donato Schultz MD Electronically signed by Donato Schultz MD Signature Date/Time: 06/19/2020/12:21:07 PM    Final     Scheduled Meds: . amLODipine  10 mg Oral Daily  . aspirin EC  81 mg Oral Daily  . atorvastatin  20 mg Oral Daily  . brimonidine  1 drop Both Eyes BID  . heparin  5,000 Units Subcutaneous Q8H  . hydrALAZINE  50 mg Oral Q8H  . insulin aspart  0-15 Units Subcutaneous TID WC  . insulin aspart  0-5 Units Subcutaneous QHS  . insulin aspart  3 Units  Subcutaneous TID WC  . insulin glargine  12 Units Subcutaneous Daily  . metoprolol succinate  50 mg Oral Daily  . potassium chloride  40 mEq Oral BID  . sodium chloride flush  3 mL Intravenous Once  . sodium chloride flush  3 mL Intravenous Q12H    Continuous Infusions: . sodium chloride    . lactated ringers 50 mL/hr at 06/19/20 4540     LOS: 1 day     Darlin Drop, MD [facility] Pager [phone]  If 7PM-7AM, please contact night-coverage [email or web address] Password [facility] 06/19/2020, 3:39 PM

## 2020-06-19 NOTE — Progress Notes (Addendum)
  Progress Note    06/19/2020 9:04 AM  Subjective:  Patient believes L heel feels better today   Vitals:   06/19/20 0500 06/19/20 0753  BP: (!) 134/52 (!) 127/45  Pulse:  57  Resp: 15 17  Temp: 98.5 F (36.9 C) 98 F (36.7 C)  SpO2: 99% 98%   Physical Exam: Lungs:  Non labored Extremities:  L heel 3x3 necrotic ulceration without surrounding erythema Neurologic: A&O  CBC    Component Value Date/Time   WBC 10.1 06/19/2020 0303   RBC 3.08 (L) 06/19/2020 0303   HGB 8.4 (L) 06/19/2020 0303   HGB 8.9 (L) 10/26/2018 1215   HCT 25.4 (L) 06/19/2020 0303   HCT 27.2 (L) 10/26/2018 1215   PLT 239 06/19/2020 0303   PLT 264 10/26/2018 1215   MCV 82.5 06/19/2020 0303   MCV 80 10/26/2018 1215   MCH 27.3 06/19/2020 0303   MCHC 33.1 06/19/2020 0303   RDW 14.7 06/19/2020 0303   RDW 14.6 10/26/2018 1215   LYMPHSABS 2.1 06/19/2020 0303   LYMPHSABS 3.2 (H) 10/26/2018 1215   MONOABS 0.9 06/19/2020 0303   EOSABS 1.7 (H) 06/19/2020 0303   EOSABS 1.8 (H) 10/26/2018 1215   BASOSABS 0.1 06/19/2020 0303   BASOSABS 0.1 10/26/2018 1215    BMET    Component Value Date/Time   NA 138 06/19/2020 0303   NA 138 10/26/2018 1215   K 3.3 (L) 06/19/2020 0303   CL 103 06/19/2020 0303   CO2 26 06/19/2020 0303   GLUCOSE 94 06/19/2020 0303   BUN 17 06/19/2020 0303   BUN 15 10/26/2018 1215   CREATININE 1.45 (H) 06/19/2020 0303   CALCIUM 8.6 (L) 06/19/2020 0303   GFRNONAA 46 (L) 06/19/2020 0303   GFRAA 54 (L) 06/19/2020 0303    INR    Component Value Date/Time   INR 1.1 06/18/2020 1559     Intake/Output Summary (Last 24 hours) at 06/19/2020 3474 Last data filed at 06/19/2020 2595 Gross per 24 hour  Intake 32.38 ml  Output --  Net 32.38 ml     Assessment/Plan:  74 y.o. male with L heel ulcer  Plan is for LLE arteriogram likely tomorrow due to critical limb ischemia Encouraged patient to offload L heel while in bed Dr. Darrick Penna will further discuss procedure with family and provide  scheduling information   Emilie Rutter, PA-C [facility] [phone] 06/19/2020 9:04 AM  Agree with above.  Aortogram with bilat runoff possible intervention Dr Chestine Spore tomorrow.  Dr [name] will be back on Thursday if pt needs further intervention based on agram  NPO p midnight IV hydration Consent  Fabienne Bruns, MD [facility] [place] Office: [phone]

## 2020-06-19 NOTE — Evaluation (Signed)
Occupational Therapy Evaluation Patient Details Name: Jeremiah [name] MRN: [id number] DOB: October 13, 1946 Today's Date: 06/19/2020    History of Present Illness Jeremiah [name]  is a 74 y.o. male, with history of PAD s/p right AKA in the past, chronic diastolic CHF, dyslipidemia, hypertension, DM type II sleep apnea but not compliant with mask.  Who has been having problems with his left foot for the last several months, he apparently has had left foot pain and discomfort for several months along with ulcer on his heel   Clinical Impression   Pt PTA: Pt has spouse who is a b/l amputee herself; son who comes by daily to deliver food/assist in any way; brother who provides transport and can assist with sliding board transfers. Ever since LLE ulce has gotten worse, pt unable to transfer or bear wt on it for OP PT prosthetic training therapy. Pt currently limited by decreased sensation in LLE, decreased strength, decreased ability for care for self and decresed activity tolerance. Pt modA+2 for bed mobility and sliding board transfer. Pt set-upA for ADL tasks and maxA for LB ADL. Pt and brother in room report that pt is close to his baseline function and plan to go home with therapy. Pt continues to require continued OT skilled services. OT following acutely.     Follow Up Recommendations  Home health OT;Supervision/Assistance - 24 hour (initially)    Equipment Recommendations  None recommended by OT    Recommendations for Other Services       Precautions / Restrictions Precautions Precautions: Fall Restrictions Weight Bearing Restrictions: No      Mobility Bed Mobility Overal bed mobility: Needs Assistance Bed Mobility: Supine to Sit     Supine to sit: Mod assist;+2 for safety/equipment     General bed mobility comments: ModA for trunk elevation and negotiating hips to EOB  Transfers Overall transfer level: Needs assistance Equipment used: Sliding board Transfers: Lateral/Scoot  Transfers          Lateral/Scoot Transfers: Mod assist;+2 safety/equipment;With slide board General transfer comment: Pt scooting to R due to drop arm in room only on R side; pt modA +2 overall for positioning slide board and negotiating over it.    Balance Overall balance assessment: Needs assistance Sitting-balance support: Bilateral upper extremity supported Sitting balance-Leahy Scale: Fair Sitting balance - Comments: weight shifting once LLE on floor                                   ADL either performed or assessed with clinical judgement   ADL Overall ADL's : Needs assistance/impaired Eating/Feeding: Set up;Bed level;Sitting   Grooming: Set up;Sitting;Bed level   Upper Body Bathing: Set up;Sitting;Bed level   Lower Body Bathing: Maximal assistance;Sitting/lateral leans;Bed level   Upper Body Dressing : Set up;Sitting;Bed level   Lower Body Dressing: Maximal assistance;Sitting/lateral leans;Bed level   Toilet Transfer: Moderate assistance;+2 for physical assistance;+2 for safety/equipment;Transfer board;Requires drop arm   Toileting- Clothing Manipulation and Hygiene: Maximal assistance;Sitting/lateral lean;Bed level       Functional mobility during ADLs: Moderate assistance;+2 for physical assistance General ADL Comments: Pt limited by decreased sensation in LLE, decreased strength, decreased ability for care for self and decresed activity tolerance.     Vision Baseline Vision/History: Wears glasses Wears Glasses: At all times Patient Visual Report: No change from baseline Vision Assessment?: No apparent visual deficits     Perception     Praxis  Pertinent Vitals/Pain Pain Assessment: No/denies pain     Hand Dominance Right   Extremity/Trunk Assessment Upper Extremity Assessment Upper Extremity Assessment: Overall WFL for tasks assessed   Lower Extremity Assessment Lower Extremity Assessment: LLE deficits/detail LLE Deficits /  Details: s/p L heel ulcer  LLE Sensation: decreased light touch;decreased proprioception;history of peripheral neuropathy LLE Coordination: decreased gross motor;decreased fine motor   Cervical / Trunk Assessment Cervical / Trunk Assessment: Normal   Communication Communication Communication: No difficulties   Cognition Arousal/Alertness: Awake/alert Behavior During Therapy: WFL for tasks assessed/performed Overall Cognitive Status: Within Functional Limits for tasks assessed                                 General Comments: able to answer cog questions but somewhat distracted by environment including fear of IV getting pulled out   General Comments  VSS    Exercises Exercises: General Lower Extremity General Exercises - Lower Extremity Ankle Circles/Pumps: AROM;Left;15 reps;Seated Quad Sets: AROM;Left;10 reps;Seated   Shoulder Instructions      Home Living Family/patient expects to be discharged to:: Private residence Living Arrangements: Spouse/significant other Available Help at Discharge: Family;Available 24 hours/day Type of Home: House Home Access: Ramped entrance     Home Layout: One level     Bathroom Shower/Tub: Chief Strategy Officer: Standard     Home Equipment: Environmental consultant - 2 wheels;Wheelchair - [employer];Shower seat;Cane - single point;Other (comment) (sliding board)   Additional Comments: pt's wife is bilateral amputee, but brother and children help pt and wife daily      Prior Functioning/Environment Level of Independence: Needs assistance  Gait / Transfers Assistance Needed: SB transfers for past several months ADL's / Homemaking Assistance Needed: assist needed   Comments: was having a HHA for BADLs; son provides meals; pt was active with OP PT for prosthetic therapy        OT Problem List: Decreased strength;Decreased activity tolerance;Impaired balance (sitting and/or standing);Decreased safety  awareness;Pain;Increased edema;Decreased knowledge of use of DME or AE      OT Treatment/Interventions: Self-care/ADL training;Therapeutic exercise;Neuromuscular education;Energy conservation;DME and/or AE instruction;Therapeutic activities;Patient/family education;Balance training    OT Goals(Current goals can be found in the care plan section) Acute Rehab OT Goals Patient Stated Goal: return home OT Goal Formulation: With patient Time For Goal Achievement: 07/03/20 Potential to Achieve Goals: Good ADL Goals Pt Will Perform Lower Body Dressing: with min guard assist;sitting/lateral leans;bed level Pt/caregiver will Perform Home Exercise Program: Increased strength;Both right and left upper extremity;With Supervision Additional ADL Goal #1: Pt will increase to minguardA overall for bed mobility as precursor for OOB ADL. Additional ADL Goal #2: Pt will perform sliding board transfers iwth minguardA overall to increase independence.  OT Frequency: Min 2X/week   Barriers to D/C:            Co-evaluation PT/OT/SLP Co-Evaluation/Treatment: Yes Reason for Co-Treatment: Complexity of the patient's impairments (multi-system involvement);To address functional/ADL transfers PT goals addressed during session: Mobility/safety with mobility;Balance;Proper use of DME OT goals addressed during session: ADL's and self-care      AM-PAC OT "6 Clicks" Daily Activity     Outcome Measure Help from another person eating meals?: None Help from another person taking care of personal grooming?: None Help from another person toileting, which includes using toliet, bedpan, or urinal?: A Lot Help from another person bathing (including washing, rinsing, drying)?: A Lot Help from another person to put on  and taking off regular upper body clothing?: A Little Help from another person to put on and taking off regular lower body clothing?: A Lot 6 Click Score: 17   End of Session Equipment Utilized During  Treatment: Gait belt;Other (comment) (slide board) Nurse Communication: Mobility status;Other (comment) (need to use sliding baord)  Activity Tolerance: Patient limited by lethargy;Patient tolerated treatment well Patient left: in chair;with call bell/phone within reach;with chair alarm set  OT Visit Diagnosis: Muscle weakness (generalized) (M62.81);Pain Pain - Right/Left: Left Pain - part of body: Ankle and joints of foot                Time: 1152-1220 OT Time Calculation (min): 28 min Charges:  OT General Charges $OT Visit: 1 Visit OT Evaluation $OT Eval Moderate Complexity: 1 Mod  Flora Lipps, OTR/L Acute Rehabilitation Services Pager: [phone] Office: [phone]   Richa Shor C 06/19/2020, 4:34 PM

## 2020-06-20 ENCOUNTER — Ambulatory Visit (HOSPITAL_COMMUNITY): Admission: RE | Admit: 2020-06-20 | Payer: Medicare Other | Source: Home / Self Care | Admitting: Vascular Surgery

## 2020-06-20 ENCOUNTER — Encounter (HOSPITAL_COMMUNITY)
Admission: EM | Disposition: A | Discharge status: Home Health | Payer: Self-pay | Source: Home / Self Care | Attending: Family Medicine

## 2020-06-20 DIAGNOSIS — I998 Other disorder of circulatory system: Secondary | ICD-10-CM

## 2020-06-20 DIAGNOSIS — I96 Gangrene, not elsewhere classified: Secondary | ICD-10-CM | POA: Diagnosis not present

## 2020-06-20 HISTORY — PX: PERIPHERAL VASCULAR INTERVENTION: CATH118257

## 2020-06-20 HISTORY — PX: ABDOMINAL AORTOGRAM W/LOWER EXTREMITY: CATH118223

## 2020-06-20 LAB — COMPREHENSIVE METABOLIC PANEL
ALT: 14 U/L (ref 0–44)
AST: 14 U/L — ABNORMAL LOW (ref 15–41)
Albumin: 2.4 g/dL — ABNORMAL LOW (ref 3.5–5.0)
Alkaline Phosphatase: 84 U/L (ref 38–126)
Anion gap: 8 (ref 5–15)
BUN: 17 mg/dL (ref 8–23)
CO2: 25 mmol/L (ref 22–32)
Calcium: 8.9 mg/dL (ref 8.9–10.3)
Chloride: 105 mmol/L (ref 98–111)
Creatinine, Ser: 1.45 mg/dL — ABNORMAL HIGH (ref 0.61–1.24)
GFR calc Af Amer: 54 mL/min — ABNORMAL LOW (ref >60)
GFR calc non Af Amer: 46 mL/min — ABNORMAL LOW (ref >60)
Glucose, Bld: 90 mg/dL (ref 70–99)
Potassium: 4.3 mmol/L (ref 3.5–5.1)
Sodium: 138 mmol/L (ref 135–145)
Total Bilirubin: 0.5 mg/dL (ref 0.3–1.2)
Total Protein: 7 g/dL (ref 6.5–8.1)

## 2020-06-20 LAB — CBC WITH DIFFERENTIAL/PLATELET
Abs Immature Granulocytes: 0.03 10*3/uL (ref 0.00–0.07)
Basophils Absolute: 0.1 10*3/uL (ref 0.0–0.1)
Basophils Relative: 1 %
Eosinophils Absolute: 1.7 10*3/uL — ABNORMAL HIGH (ref 0.0–0.5)
Eosinophils Relative: 15 %
HCT: 25.5 % — ABNORMAL LOW (ref 39.0–52.0)
Hemoglobin: 8.2 g/dL — ABNORMAL LOW (ref 13.0–17.0)
Immature Granulocytes: 0 %
Lymphocytes Relative: 26 %
Lymphs Abs: 2.8 10*3/uL (ref 0.7–4.0)
MCH: 26.9 pg (ref 26.0–34.0)
MCHC: 32.2 g/dL (ref 30.0–36.0)
MCV: 83.6 fL (ref 80.0–100.0)
Monocytes Absolute: 0.9 10*3/uL (ref 0.1–1.0)
Monocytes Relative: 9 %
Neutro Abs: 5.5 10*3/uL (ref 1.7–7.7)
Neutrophils Relative %: 49 %
Platelets: 215 10*3/uL (ref 150–400)
RBC: 3.05 MIL/uL — ABNORMAL LOW (ref 4.22–5.81)
RDW: 14.9 % (ref 11.5–15.5)
WBC: 11 10*3/uL — ABNORMAL HIGH (ref 4.0–10.5)
nRBC: 0 % (ref 0.0–0.2)

## 2020-06-20 LAB — POCT ACTIVATED CLOTTING TIME
Activated Clotting Time: 263 seconds
Activated Clotting Time: 235 seconds
Activated Clotting Time: 197 seconds
Activated Clotting Time: 169 seconds
Activated Clotting Time: 186 seconds

## 2020-06-20 LAB — GLUCOSE, CAPILLARY
Glucose-Capillary: 85 mg/dL (ref 70–99)
Glucose-Capillary: 89 mg/dL (ref 70–99)
Glucose-Capillary: 90 mg/dL (ref 70–99)
Glucose-Capillary: 171 mg/dL — ABNORMAL HIGH (ref 70–99)

## 2020-06-20 LAB — MAGNESIUM: Magnesium: 1.9 mg/dL (ref 1.7–2.4)

## 2020-06-20 LAB — BRAIN NATRIURETIC PEPTIDE: B Natriuretic Peptide: 244 pg/mL — ABNORMAL HIGH (ref 0.0–100.0)

## 2020-06-20 LAB — PROCALCITONIN: Procalcitonin: <0.1 ng/mL

## 2020-06-20 LAB — C-REACTIVE PROTEIN: CRP: 1.5 mg/dL — ABNORMAL HIGH (ref <1.0)

## 2020-06-20 SURGERY — ABDOMINAL AORTOGRAM W/LOWER EXTREMITY
Anesthesia: LOCAL | Laterality: Left

## 2020-06-20 MED ORDER — CLOPIDOGREL BISULFATE 300 MG PO TABS
ORAL_TABLET | ORAL | Status: AC
  Filled 2020-06-20: qty 1

## 2020-06-20 MED ORDER — HYDRALAZINE HCL 20 MG/ML IJ SOLN: 5.0000 mg | INTRAMUSCULAR | Status: DC | PRN

## 2020-06-20 MED ORDER — HEPARIN SODIUM (PORCINE) 1000 UNIT/ML IJ SOLN
INTRAMUSCULAR | Status: DC | PRN
  Administered 2020-06-20: 8000 [IU] via INTRAVENOUS

## 2020-06-20 MED ORDER — MIDAZOLAM HCL 2 MG/2ML IJ SOLN
INTRAMUSCULAR | Status: AC
  Filled 2020-06-20: qty 2

## 2020-06-20 MED ORDER — LIDOCAINE HCL (PF) 1 % IJ SOLN
INTRAMUSCULAR | Status: DC | PRN
  Administered 2020-06-20: 15 mL

## 2020-06-20 MED ORDER — CLOPIDOGREL BISULFATE 75 MG PO TABS: 300.0000 mg | ORAL_TABLET | Freq: Once | ORAL | Status: AC

## 2020-06-20 MED ORDER — MIDAZOLAM HCL 2 MG/2ML IJ SOLN
INTRAMUSCULAR | Status: DC | PRN
  Administered 2020-06-20 (×2): 1 mg via INTRAVENOUS

## 2020-06-20 MED ORDER — FENTANYL CITRATE (PF) 100 MCG/2ML IJ SOLN
INTRAMUSCULAR | Status: AC
  Filled 2020-06-20: qty 2

## 2020-06-20 MED ORDER — LABETALOL HCL 5 MG/ML IV SOLN: 10.0000 mg | INTRAVENOUS | Status: DC | PRN

## 2020-06-20 MED ORDER — INSULIN GLARGINE 100 UNIT/ML ~~LOC~~ SOLN
6.0000 [IU] | Freq: Every day | SUBCUTANEOUS | Status: DC
  Administered 2020-06-20 – 2020-06-21 (×2): 6 [IU] via SUBCUTANEOUS
  Filled 2020-06-20 (×2): qty 0.06

## 2020-06-20 MED ORDER — SODIUM CHLORIDE 0.9 % IV SOLN: INTRAVENOUS | Status: DC

## 2020-06-20 MED ORDER — ACETAMINOPHEN 650 MG RE SUPP: 650.0000 mg | Freq: Four times a day (QID) | RECTAL | Status: DC | PRN

## 2020-06-20 MED ORDER — SODIUM CHLORIDE 0.9 % IV SOLN: INTRAVENOUS | Status: AC

## 2020-06-20 MED ORDER — HEPARIN (PORCINE) IN NACL 1000-0.9 UT/500ML-% IV SOLN
INTRAVENOUS | Status: AC
  Filled 2020-06-20: qty 1000

## 2020-06-20 MED ORDER — HYDRALAZINE HCL 50 MG PO TABS
100.0000 mg | ORAL_TABLET | Freq: Three times a day (TID) | ORAL | Status: DC
  Administered 2020-06-20 – 2020-06-21 (×3): 100 mg via ORAL
  Filled 2020-06-20 (×3): qty 2

## 2020-06-20 MED ORDER — ONDANSETRON HCL 4 MG/2ML IJ SOLN: 4.0000 mg | Freq: Four times a day (QID) | INTRAMUSCULAR | Status: DC | PRN

## 2020-06-20 MED ORDER — DEXTROSE 5 % IV SOLN: INTRAVENOUS | Status: DC

## 2020-06-20 MED ORDER — ACETAMINOPHEN 325 MG PO TABS: 650.0000 mg | ORAL_TABLET | ORAL | Status: DC | PRN

## 2020-06-20 MED ORDER — CLOPIDOGREL BISULFATE 300 MG PO TABS
ORAL_TABLET | ORAL | Status: DC | PRN
  Administered 2020-06-20: 300 mg via ORAL

## 2020-06-20 MED ORDER — SODIUM CHLORIDE 0.9 % IV SOLN: 250.0000 mL | INTRAVENOUS | Status: DC | PRN

## 2020-06-20 MED ORDER — IODIXANOL 320 MG/ML IV SOLN
INTRAVENOUS | Status: DC | PRN
  Administered 2020-06-20: 100 mL

## 2020-06-20 MED ORDER — SODIUM CHLORIDE 0.9% FLUSH
3.0000 mL | Freq: Two times a day (BID) | INTRAVENOUS | Status: DC
  Administered 2020-06-20: 3 mL via INTRAVENOUS

## 2020-06-20 MED ORDER — HEPARIN SODIUM (PORCINE) 1000 UNIT/ML IJ SOLN
INTRAMUSCULAR | Status: AC
  Filled 2020-06-20: qty 1

## 2020-06-20 MED ORDER — FENTANYL CITRATE (PF) 100 MCG/2ML IJ SOLN
INTRAMUSCULAR | Status: DC | PRN
  Administered 2020-06-20 (×2): 25 ug via INTRAVENOUS
  Administered 2020-06-20: 50 ug via INTRAVENOUS

## 2020-06-20 MED ORDER — CLOPIDOGREL BISULFATE 75 MG PO TABS
75.0000 mg | ORAL_TABLET | Freq: Every day | ORAL | Status: DC
  Administered 2020-06-21: 75 mg via ORAL
  Filled 2020-06-20: qty 1

## 2020-06-20 MED ORDER — HYDRALAZINE HCL 20 MG/ML IJ SOLN
INTRAMUSCULAR | Status: DC | PRN
  Administered 2020-06-20: 10 mg via INTRAVENOUS

## 2020-06-20 MED ORDER — SODIUM CHLORIDE 0.9% FLUSH: 3.0000 mL | INTRAVENOUS | Status: DC | PRN

## 2020-06-20 MED ORDER — LIDOCAINE HCL (PF) 1 % IJ SOLN
INTRAMUSCULAR | Status: AC
  Filled 2020-06-20: qty 30

## 2020-06-20 MED ORDER — HYDRALAZINE HCL 20 MG/ML IJ SOLN
INTRAMUSCULAR | Status: AC
  Filled 2020-06-20: qty 1

## 2020-06-20 MED ORDER — ACETAMINOPHEN 325 MG PO TABS: 650.0000 mg | ORAL_TABLET | Freq: Four times a day (QID) | ORAL | Status: DC | PRN

## 2020-06-20 SURGICAL SUPPLY — 26 items
BALLN JADE .035 4.0 X 240 (BALLOONS) ×2
BALLN JADE .035 5.0 X 240 (BALLOONS) ×2
BALLOON JADE .035 4.0 X 240 (BALLOONS) IMPLANT
BALLOON JADE .035 5.0 X 240 (BALLOONS) IMPLANT
CATH NAVICROSS ANGLED 135CM (MICROCATHETER) ×1 IMPLANT
CATH OMNI FLUSH 5F 65CM (CATHETERS) ×1 IMPLANT
CATH QUICKCROSS .035X135CM (MICROCATHETER) ×1 IMPLANT
DEVICE TORQUE .025-.038 (MISCELLANEOUS) ×1 IMPLANT
GLIDEWIRE ADV .035X260CM (WIRE) ×2 IMPLANT
GUIDEWIRE ANGLED .035X260CM (WIRE) ×1 IMPLANT
GUIDEWIRE INQWIRE 1.5J.035X260 (WIRE) ×1 IMPLANT
INQWIRE 1.5J .035X260CM (WIRE) ×2
KIT ENCORE 26 ADVANTAGE (KITS) ×1 IMPLANT
KIT MICROPUNCTURE NIT STIFF (SHEATH) ×1 IMPLANT
KIT PV (KITS) ×2 IMPLANT
SHEATH FLEX ANSEL ANG 6F 45CM (SHEATH) ×2 IMPLANT
SHEATH PINNACLE 5F 10CM (SHEATH) ×1 IMPLANT
SHEATH PROBE COVER 6X72 (BAG) ×1 IMPLANT
STENT ELUVIA 6X120X130 (Permanent Stent) ×3 IMPLANT
STENT ELUVIA 6X80X130 (Permanent Stent) ×1 IMPLANT
SYR MEDRAD MARK V 150ML (SYRINGE) ×1 IMPLANT
TRANSDUCER W/STOPCOCK (MISCELLANEOUS) ×2 IMPLANT
TRAY PV CATH (CUSTOM PROCEDURE TRAY) ×2 IMPLANT
TUBING INJECTOR 48 (MISCELLANEOUS) ×1 IMPLANT
WIRE ROSEN-J .035X260CM (WIRE) ×1 IMPLANT
WIRE STARTER BENTSON 035X150 (WIRE) ×1 IMPLANT

## 2020-06-20 NOTE — Progress Notes (Signed)
PROGRESS NOTE    MICAH BARNIER  VWU:[id number] DOB: 1946/06/22 DOA: 06/18/2020 PCP: System, Pcp Not In  Brief Narrative:  20 AAM PAD s/p AKA R sided 11/09/2018 HFpEF Complicated cholecystotomy + drain + xanthogranuloma cholecystitis and malnourishment with drain performed finally 05/28/2018  Resulting C. difficile 06/06/2018 after surgery DM TY 2 Hyperlipidemia OSA not compliant on CPAP  Admitted from office 2/2 nonhealing left heel ulcer-was not septic on admission Vascular surgeon evaluated patient and aortogram performed 7/28  Assessment & Plan:   Principal Problem:   Gangrene (HCC) Active Problems:   PVD (peripheral vascular disease) (HCC)   S/P AKA (above knee amputation) (HCC)   Diabetes mellitus type 2 in nonobese (HCC)   Dyslipidemia   Type 2 diabetes mellitus with peripheral neuropathy (HCC)   Benign essential HTN   1. Nonhealing left heel ulcer a. Aortogram performed-had intervention with stent popliteal area-please see note from Dr. Chestine Spore b. Further management/discussion as per them-?  Plavix,?  Aspirin alone c. Therapy recommends home health PT 2. 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 This is represents a change from prior b. We will discuss with cardiology in a.m. regarding outpatient follow-up 3. HFpEF Echo shows EF 70 to 75% hyperdynamic without acute component a. Have discontinued amlodipine secondary to increase in stenosis gradient continue cautiously metoprolol XL 50 continue hydralazine at a higher dose of 100 every 8 b. Seems euvolemic at this time no need for diuretics c. Continue cautious saline 50 cc/H d. Outpatient follow-up as discussed above 4. DM TY 2 a. Mildly hypoglycemic this morning in the 80s b. Saline lock D5 and preference for normal saline infusion as post-cath c. Continue sliding scale sensitive 3 times daily 3 units and Lantus has been decreased from 12 units to 6 units because of hypoglycemia 5. [name] on CPAP a. Will  need discussion regarding the same b. Might benefit from outpatient testing 6. Prior C. difficile colitis 7. BMI 23 8. Hyperlipidemia   DVT prophylaxis: Lovenox Code Status: Full Family Communication: None at bedside I saw the patient in PACU Disposition: Inpatient at this time  Status is: Inpatient  Remains inpatient appropriate because:Hemodynamically unstable and IV treatments appropriate due to intensity of illness or inability to take PO   Dispo:  Patient From: Home  Planned Disposition: Home with Health Care Svc  Expected discharge date: 06/22/20  Medically stable for discharge: No        Consultants:   Vascular  Procedures: Angiography Dr. Chestine Spore 7/28 Surgeon:  Cephus Shelling, MD Procedure Performed: 1.  Ultrasound-guided access of right common femoral artery 2.  Aortogram including catheter selection of aorta 3.  Left lower extremity arteriogram with selection of third order branches 4.  Left SFA and above knee popliteal artery angioplasty with stent placement for long SFA chronic total occlusion (predilated with a 4 mm jade, primarily stented with a 6 mm x 120 mm drug-coated Eluvia x3 and 6 mm x 80 mm drug-coated Eluvia, all stents postdilated with a 5 mm Jade) 5.  90 minutes of monitored moderate conscious sedation time  Antimicrobials: None current   Subjective: Seen in PACU Somewhat hard of hearing Seems a little bit confused as to what procedure was done-his understanding was that his test did not show any blockages however when I read him the report he says "oh yeah they cleaned it out" He is somewhat hard of hearing in addition He has no chest pain no fever but he is very hungry  Objective: Vitals:   06/20/20 1509 06/20/20 1555 06/20/20 1610 06/20/20 1625  BP: (!) 162/69 (!) 165/49 (!) 165/50 (!) 167/48  Pulse: 52 54 53 52  Resp: 15 12 (!) 11 (!) 10  Temp:      TempSrc:      SpO2: 98% 97% 97% 97%  Weight:      Height:         Intake/Output Summary (Last 24 hours) at 06/20/2020 1800 Last data filed at 06/20/2020 1451 Gross per 24 hour  Intake --  Output 300 ml  Net -300 ml   Filed Weights   06/19/20 1046  Weight: 72.6 kg    Examination:  General exam: Alert pleasant moderate dentition Respiratory system: Chest clear no rales no rhonchi Cardiovascular system: S1-S2 holosystolic murmur LUSB with radiation to carotids I did not straight leg raise his leg because of them pulling the sheath Gastrointestinal system: Soft nontender cursory exam because of compression. Central nervous system: Grossly intact Extremities: ROM intact Skin: No lower extremity edema Psychiatry: Euthymic pleasant  Data Reviewed: I have personally reviewed following labs and imaging studies BUN/creatinine 17/1.4 BNP 244 CRP down from 2.9-1.5 WBC up from 10.1 11.0 Hemoglobin at baseline 8.2  Radiology Studies: PERIPHERAL VASCULAR CATHETERIZATION  Result Date: 06/20/2020 Patient name: YEUDIEL MATEO         MRN: [id number]        DOB: [birth year]          Sex: male  06/20/2020 Pre-operative Diagnosis: Critical limb ischemia left lower extremity with heel wound Post-operative diagnosis:  Same Surgeon:  Cephus Shelling, MD Procedure Performed: 1.  Ultrasound-guided access of right common femoral artery 2.  Aortogram including catheter selection of aorta 3.  Left lower extremity arteriogram with selection of third order branches 4.  Left SFA and above knee popliteal artery angioplasty with stent placement for long SFA chronic total occlusion (predilated with a 4 mm jade, primarily stented with a 6 mm x 120 mm drug-coated Eluvia x3 and 6 mm x 80 mm drug-coated Eluvia, all stents postdilated with a 5 mm Jade) 5.  90 minutes of monitored moderate conscious sedation time  Indications: Patient is a 74 year old male who previously has been under the care of Dr. [name].  He presented with a left heel wound and was seen by Dr. Darrick Penna for  consultation.  He presents today for planned arteriogram and possible intervention for limb salvage after risk benefits discussed.  Findings:  Aortogram showed patent aortoiliac segment with no overt flow-limiting stenosis.  Does have some calcification in the left common iliac but he has a very good left femoral pulse and this does not appear flow-limiting.  Left lower extremity arteriogram shows a patent common femoral and profunda with a very diseased proximal SFA stump.  He then has a long segment SFA chronic total occlusion throughout the mid distal segment with reconstitution of the above-knee popliteal artery and two vessel runoff via the peroneal and posterior tibial artery.  Ultimately I was able to cross left SFA above-knee popliteal occlusion and get back in the true lumen just above the knee joint.  This was all predilated with a 4 mm jade angioplasty balloon and then primarily stented with a 6 mm drug covered Eluvia stents and postdilated with a 5 mm angioplasty balloon.             Procedure:  The patient was identified in the holding area and taken to room 8.  The patient was then  placed supine on the table and prepped and draped in the usual sterile fashion.  A time out was called.  Ultrasound was used to evaluate the right common femoral artery.  It was patent .  A digital ultrasound image was acquired.  A micropuncture needle was used to access the right common femoral artery under ultrasound guidance.  An 018 wire was advanced without resistance and a micropuncture sheath was placed.  The 018 wire was removed and a benson wire was placed.  The micropuncture sheath was exchanged for a 5 french sheath.  An omniflush catheter was advanced over the wire to the level of L-1.  An abdominal angiogram was obtained.  Next, using the omniflush catheter and a benson wire, the aortic bifurcation was crossed and the catheter was placed into theleft external iliac artery and left runoff was obtained.   Pertinent findings are noted above but he appeared to have long segment SFA occlusion with reconstitution of an above-knee popliteal artery.  There was a proximal stump of SFA to work with although it was diseased.  That point time used a Rosen wire and exchanged for 6 [employer] sheath in the right groin over the aortic bifurcation.  Patient was given 100 units/kg heparin and ACT was checked to maintain greater than 250.  Subsequently used a Glidewire advantage with a 035 quick cross and cannulated the proximal SFA and after multiple manipulations including exchanging for a soft angled Glidewire was finally able to get across the SFA into the above-knee popliteal artery across the CTO.  Initially I was in a dissection distally in the above knee popliteal artery although the true lumen of the artery was filling suggesting these communicated.  I then ultimately used a multitude of maneuvers with the angled glidwire and finally got into the true lumen in the behind the knee popliteal artery and ultimately put a Rosen wire down into the below-knee popliteal artery after we confirmed we were in the true lumen.  I then used a long 4 mm jade angioplasty balloon and the entire SFA and above-knee popliteal artery was angioplastied to nominal pressure for 2 minutes.  I then planned to primarily stent and we deployed three 6 mm x 150 mm drug coated Eluvia stents and a 6 mm x 80 mm Eluvia from the above-knee popliteal artery all the way across Hunter's canal up to the takeoff of SFA at the ostium.  All of the stents were then postdilated with a 5 mm jade angioplasty balloon throughout the entire segment.  Final hand-injection after pulling the wire back showed excellent inline flow down the SFA stents with filling of the behind the knee and below-knee popliteal artery and preserved two-vessel runoff via the posterior tibial peroneal artery.  There is no evidence of residual dissection.  Wires and catheters were removed to  be taken holding the sheath removed.  Plan: Patient will be loaded on aspirin Plavix.  He now has inline flow down the left lower extremity.  Cephus Shelling, MD [facility] Office: [phone]   ECHOCARDIOGRAM COMPLETE  Result Date: 06/19/2020    ECHOCARDIOGRAM REPORT   Patient Name:   JAHARI BILLY Date of Exam: 06/19/2020 Medical Rec #:  [id number]       Height:       69.0 in Accession #:    [id number]      Weight:       160.0 lb Date of Birth:  07/28/1946  BSA:          1.879 m Patient Age:    74 years        BP:           127/45 mmHg Patient Gender: M               HR:           57 bpm. Exam Location:  Inpatient Procedure: 2D Echo, Cardiac Doppler and Color Doppler Indications:    I50.31 Acute diastolic (congestive) heart failure  History:        Patient has prior history of Echocardiogram examinations, most                 recent 03/28/2018. Aortic Valve Disease, Signs/Symptoms:Altered                 Mental Status and Fever; Risk Factors:Hypertension, Diabetes and                 Dyslipidemia. Aortic stenosis.  Sonographer:    Sheralyn Boatman RDCS Referring Phys: [id number] [facility]  Sonographer Comments: Technically difficult study due to poor echo windows. IMPRESSIONS  1. Left ventricular ejection fraction, by estimation, is 70 to 75%. The left ventricle has hyperdynamic function. The left ventricle has no regional wall motion abnormalities. There is moderate left ventricular hypertrophy. Left ventricular diastolic parameters are consistent with Grade I diastolic dysfunction (impaired relaxation).  2. Right ventricular systolic function is normal. The right ventricular size is normal.  3. The mitral valve is normal in structure. No evidence of mitral valve regurgitation. No evidence of mitral stenosis.  4. The aortic valve has an indeterminant number of cusps. Aortic valve regurgitation is not visualized. Moderate aortic valve stenosis. Aortic valve area, by VTI  measures 1.34 cm. Aortic valve mean gradient measures 23.8 mmHg. Aortic valve Vmax measures 3.27 m/s.  5. The inferior vena cava is normal in size with greater than 50% respiratory variability, suggesting right atrial pressure of 3 mmHg. Comparison(s): Aortic stenosis has advanced (prior mean gradient 16 mmHg). FINDINGS  Left Ventricle: Left ventricular ejection fraction, by estimation, is 70 to 75%. The left ventricle has hyperdynamic function. The left ventricle has no regional wall motion abnormalities. The left ventricular internal cavity size was normal in size. There is moderate left ventricular hypertrophy. Left ventricular diastolic parameters are consistent with Grade I diastolic dysfunction (impaired relaxation). Right Ventricle: The right ventricular size is normal. No increase in right ventricular wall thickness. Right ventricular systolic function is normal. Left Atrium: Left atrial size was normal in size. Right Atrium: Right atrial size was normal in size. Pericardium: There is no evidence of pericardial effusion. Mitral Valve: The mitral valve is normal in structure. Normal mobility of the mitral valve leaflets. No evidence of mitral valve regurgitation. No evidence of mitral valve stenosis. Tricuspid Valve: The tricuspid valve is normal in structure. Tricuspid valve regurgitation is not demonstrated. No evidence of tricuspid stenosis. Aortic Valve: The aortic valve has an indeterminant number of cusps. . There is moderate thickening and moderate calcification of the aortic valve. Aortic valve regurgitation is not visualized. Moderate aortic stenosis is present. There is moderate thickening of the aortic valve. There is moderate calcification of the aortic valve. Aortic valve mean gradient measures 23.8 mmHg. Aortic valve peak gradient measures 42.7 mmHg. Aortic valve area, by VTI measures 1.34 cm. Pulmonic Valve: The pulmonic valve was normal in structure. Pulmonic valve regurgitation is not  visualized. No evidence of pulmonic  stenosis. Aorta: The aortic root is normal in size and structure. Venous: The inferior vena cava is normal in size with greater than 50% respiratory variability, suggesting right atrial pressure of 3 mmHg. IAS/Shunts: No atrial level shunt detected by color flow Doppler.  LEFT VENTRICLE PLAX 2D LVIDd:         4.00 cm     Diastology LVIDs:         2.50 cm     LV e' lateral:   6.20 cm/s LV PW:         1.50 cm     LV E/e' lateral: 16.3 LV IVS:        1.40 cm     LV e' medial:    6.31 cm/s LVOT diam:     1.80 cm     LV E/e' medial:  16.0 LV SV:         104 LV SV Index:   55 LVOT Area:     2.54 cm  LV Volumes (MOD) LV vol d, MOD A2C: 85.2 ml LV vol d, MOD A4C: 68.9 ml LV vol s, MOD A2C: 24.4 ml LV vol s, MOD A4C: 23.3 ml LV SV MOD A2C:     60.8 ml LV SV MOD A4C:     68.9 ml LV SV MOD BP:      51.9 ml RIGHT VENTRICLE             IVC RV S prime:     12.50 cm/s  IVC diam: 1.80 cm TAPSE (M-mode): 2.9 cm LEFT ATRIUM           Index       RIGHT ATRIUM           Index LA diam:      3.20 cm 1.70 cm/m  RA Area:     11.20 cm LA Vol (A2C): 24.8 ml 13.20 ml/m RA Volume:   22.60 ml  12.03 ml/m LA Vol (A4C): 35.0 ml 18.63 ml/m  AORTIC VALVE AV Area (Vmax):    1.55 cm AV Area (Vmean):   1.30 cm AV Area (VTI):     1.34 cm AV Vmax:           326.60 cm/s AV Vmean:          226.400 cm/s AV VTI:            0.774 m AV Peak Grad:      42.7 mmHg AV Mean Grad:      23.8 mmHg LVOT Vmax:         199.00 cm/s LVOT Vmean:        116.000 cm/s LVOT VTI:          0.408 m LVOT/AV VTI ratio: 0.53  AORTA Ao Root diam: 2.80 cm MITRAL VALVE MV Area (PHT): 2.56 cm     SHUNTS MV Decel Time: 296 msec     Systemic VTI:  0.41 m MV E velocity: 101.00 cm/s  Systemic Diam: 1.80 cm MV A velocity: 130.00 cm/s MV E/A ratio:  0.78 Donato Schultz MD Electronically signed by Donato Schultz MD Signature Date/Time: 06/19/2020/12:21:07 PM    Final      Scheduled Meds: . [MAR Hold] aspirin EC  81 mg Oral Daily  . [MAR Hold]  atorvastatin  20 mg Oral Daily  . [MAR Hold] brimonidine  1 drop Both Eyes BID  . [MAR Hold] heparin  5,000 Units Subcutaneous Q8H  . [MAR Hold] hydrALAZINE  100 mg Oral Q8H  . [  MAR Hold] insulin aspart  0-15 Units Subcutaneous TID WC  . [MAR Hold] insulin aspart  0-5 Units Subcutaneous QHS  . [MAR Hold] insulin aspart  3 Units Subcutaneous TID WC  . [MAR Hold] insulin glargine  6 Units Subcutaneous Daily  . [MAR Hold] metoprolol succinate  50 mg Oral Daily  . [MAR Hold] sodium chloride flush  3 mL Intravenous Once  . [MAR Hold] sodium chloride flush  3 mL Intravenous Q12H  . sodium chloride flush  3 mL Intravenous Q12H   Continuous Infusions: . [MAR Hold] sodium chloride    . sodium chloride    . sodium chloride    . dextrose 75 mL/hr at 06/20/20 1020     LOS: 2 days    Time spent: 6945  Rhetta MuraJai-Gurmukh Pete Merten, MD [facility] To contact the attending provider between 7A-7P or the covering provider during after hours 7P-7A, please log into the web site [email or web address] and access using universal [place] password for that web site. If you do not have the password, please call the hospital operator.  06/20/2020, 6:00 PM

## 2020-06-20 NOTE — Progress Notes (Signed)
Patient to room 4E10 from cath lab. Vital signs obtained. On monitor CCMD notified. Alert and oriented to room and call light. Call bell within reach.  Sabra Heck, RN

## 2020-06-20 NOTE — Op Note (Signed)
Patient name: Jeremiah [name] MRN: [id number] DOB: Nov 26, 1945 Sex: male  06/20/2020 Pre-operative Diagnosis: Critical limb ischemia left lower extremity with heel wound Post-operative diagnosis:  Same Surgeon:  Cephus Shelling, MD Procedure Performed: 1.  Ultrasound-guided access of right common femoral artery 2.  Aortogram including catheter selection of aorta 3.  Left lower extremity arteriogram with selection of third order branches 4.  Left SFA and above knee popliteal artery angioplasty with stent placement for long SFA chronic total occlusion (predilated with a 4 mm jade, primarily stented with a 6 mm x 120 mm drug-coated Eluvia x3 and 6 mm x 80 mm drug-coated Eluvia, all stents postdilated with a 5 mm Jade) 5.  90 minutes of monitored moderate conscious sedation time  Indications: Patient is a 74 year old male who previously has been under the care of Dr. [name].  He presented with a left heel wound and was seen by Dr. Darrick Penna for consultation.  He presents today for planned arteriogram and possible intervention for limb salvage after risk benefits discussed.  Findings:   Aortogram showed patent aortoiliac segment with no overt flow-limiting stenosis.  Does have some calcification in the left common iliac but he has a very good left femoral pulse and this does not appear flow-limiting.  Left lower extremity arteriogram shows a patent common femoral and profunda with a very diseased proximal SFA stump.  He then has a long segment SFA chronic total occlusion throughout the mid distal segment with reconstitution of the above-knee popliteal artery and two vessel runoff via the peroneal and posterior tibial artery.  Ultimately I was able to cross left SFA above-knee popliteal occlusion and get back in the true lumen just above the knee joint.  This was all predilated with a 4 mm jade angioplasty balloon and then primarily stented with a 6 mm drug covered Eluvia stents and postdilated with  a 5 mm angioplasty balloon.   Procedure:  The patient was identified in the holding area and taken to room 8.  The patient was then placed supine on the table and prepped and draped in the usual sterile fashion.  A time out was called.  Ultrasound was used to evaluate the right common femoral artery.  It was patent .  A digital ultrasound image was acquired.  A micropuncture needle was used to access the right common femoral artery under ultrasound guidance.  An 018 wire was advanced without resistance and a micropuncture sheath was placed.  The 018 wire was removed and a benson wire was placed.  The micropuncture sheath was exchanged for a 5 french sheath.  An omniflush catheter was advanced over the wire to the level of L-1.  An abdominal angiogram was obtained.  Next, using the omniflush catheter and a benson wire, the aortic bifurcation was crossed and the catheter was placed into theleft external iliac artery and left runoff was obtained.  Pertinent findings are noted above but he appeared to have long segment SFA occlusion with reconstitution of an above-knee popliteal artery.  There was a proximal stump of SFA to work with although it was diseased.  That point time used a Rosen wire and exchanged for 6 [employer] sheath in the right groin over the aortic bifurcation.  Patient was given 100 units/kg heparin and ACT was checked to maintain greater than 250.  Subsequently used a Glidewire advantage with a 035 quick cross and cannulated the proximal SFA and after multiple manipulations including exchanging for a soft angled Glidewire  was finally able to get across the SFA into the above-knee popliteal artery across the CTO.  Initially I was in a dissection distally in the above knee popliteal artery although the true lumen of the artery was filling suggesting these communicated.  I then ultimately used a multitude of maneuvers with the angled glidwire and finally got into the true lumen in the behind the  knee popliteal artery and ultimately put a Rosen wire down into the below-knee popliteal artery after we confirmed we were in the true lumen.  I then used a long 4 mm jade angioplasty balloon and the entire SFA and above-knee popliteal artery was angioplastied to nominal pressure for 2 minutes.  I then planned to primarily stent and we deployed three 6 mm x 150 mm drug coated Eluvia stents and a 6 mm x 80 mm Eluvia from the above-knee popliteal artery all the way across Hunter's canal up to the takeoff of SFA at the ostium.  All of the stents were then postdilated with a 5 mm jade angioplasty balloon throughout the entire segment.  Final hand-injection after pulling the wire back showed excellent inline flow down the SFA stents with filling of the behind the knee and below-knee popliteal artery and preserved two-vessel runoff via the posterior tibial peroneal artery.  There is no evidence of residual dissection.  Wires and catheters were removed to be taken holding the sheath removed.  Plan: Patient will be loaded on aspirin Plavix.  He now has inline flow down the left lower extremity.  Cephus Shelling, MD [facility] Office: [phone]

## 2020-06-20 NOTE — CV Procedure (Signed)
Order for sheath removal verified per post procedural orders. Procedure explained to patient and Right femoral artery access site assessed: level 0. 6 French Sheath removed and manual pressure applied for 15 minutes.Pain level 0. Access site level 0 and dressed with 4X4 gauze and tegaderm. Post procedural instructions discussed with return demonstration from patient. Bedrest to begin at 18:00

## 2020-06-20 NOTE — Progress Notes (Signed)
PT Cancellation Note  Patient Details Name: WINTHROP SHANNAHAN MRN: [id number] DOB: 03-Dec-1945   Cancelled Treatment:    Reason Eval/Treat Not Completed: Patient at procedure or test/unavailable - will check back as schedule allows.  Richrd Sox, PT Acute Rehabilitation Services Pager [phone]  Office [phone]    Tyrone Apple D Despina Hidden 06/20/2020, 4:16 PM

## 2020-06-21 ENCOUNTER — Encounter (HOSPITAL_COMMUNITY): Payer: Self-pay | Admitting: Vascular Surgery

## 2020-06-21 DIAGNOSIS — I96 Gangrene, not elsewhere classified: Secondary | ICD-10-CM | POA: Diagnosis not present

## 2020-06-21 LAB — CBC WITH DIFFERENTIAL/PLATELET
Abs Immature Granulocytes: 0.04 10*3/uL (ref 0.00–0.07)
Basophils Absolute: 0.1 10*3/uL (ref 0.0–0.1)
Basophils Relative: 0 %
Eosinophils Absolute: 1.4 10*3/uL — ABNORMAL HIGH (ref 0.0–0.5)
Eosinophils Relative: 12 %
HCT: 24.7 % — ABNORMAL LOW (ref 39.0–52.0)
Hemoglobin: 8.1 g/dL — ABNORMAL LOW (ref 13.0–17.0)
Immature Granulocytes: 0 %
Lymphocytes Relative: 17 %
Lymphs Abs: 1.9 10*3/uL (ref 0.7–4.0)
MCH: 27.5 pg (ref 26.0–34.0)
MCHC: 32.8 g/dL (ref 30.0–36.0)
MCV: 83.7 fL (ref 80.0–100.0)
Monocytes Absolute: 1.3 10*3/uL — ABNORMAL HIGH (ref 0.1–1.0)
Monocytes Relative: 11 %
Neutro Abs: 7.1 10*3/uL (ref 1.7–7.7)
Neutrophils Relative %: 60 %
Platelets: 210 10*3/uL (ref 150–400)
RBC: 2.95 MIL/uL — ABNORMAL LOW (ref 4.22–5.81)
RDW: 14.8 % (ref 11.5–15.5)
WBC: 11.7 10*3/uL — ABNORMAL HIGH (ref 4.0–10.5)
nRBC: 0 % (ref 0.0–0.2)

## 2020-06-21 LAB — BRAIN NATRIURETIC PEPTIDE: B Natriuretic Peptide: 333.5 pg/mL — ABNORMAL HIGH (ref 0.0–100.0)

## 2020-06-21 LAB — GLUCOSE, CAPILLARY
Glucose-Capillary: 81 mg/dL (ref 70–99)
Glucose-Capillary: 128 mg/dL — ABNORMAL HIGH (ref 70–99)
Glucose-Capillary: 147 mg/dL — ABNORMAL HIGH (ref 70–99)

## 2020-06-21 LAB — COMPREHENSIVE METABOLIC PANEL
ALT: 10 U/L (ref 0–44)
AST: 12 U/L — ABNORMAL LOW (ref 15–41)
Albumin: 2.4 g/dL — ABNORMAL LOW (ref 3.5–5.0)
Alkaline Phosphatase: 88 U/L (ref 38–126)
Anion gap: 10 (ref 5–15)
BUN: 16 mg/dL (ref 8–23)
CO2: 23 mmol/L (ref 22–32)
Calcium: 8.8 mg/dL — ABNORMAL LOW (ref 8.9–10.3)
Chloride: 104 mmol/L (ref 98–111)
Creatinine, Ser: 1.37 mg/dL — ABNORMAL HIGH (ref 0.61–1.24)
GFR calc Af Amer: 58 mL/min — ABNORMAL LOW (ref >60)
GFR calc non Af Amer: 50 mL/min — ABNORMAL LOW (ref >60)
Glucose, Bld: 106 mg/dL — ABNORMAL HIGH (ref 70–99)
Potassium: 4 mmol/L (ref 3.5–5.1)
Sodium: 137 mmol/L (ref 135–145)
Total Bilirubin: 0.5 mg/dL (ref 0.3–1.2)
Total Protein: 6.7 g/dL (ref 6.5–8.1)

## 2020-06-21 LAB — C-REACTIVE PROTEIN: CRP: 1.5 mg/dL — ABNORMAL HIGH (ref <1.0)

## 2020-06-21 LAB — MAGNESIUM: Magnesium: 1.8 mg/dL (ref 1.7–2.4)

## 2020-06-21 LAB — PROCALCITONIN: Procalcitonin: <0.1 ng/mL

## 2020-06-21 MED ORDER — HYDRALAZINE HCL 100 MG PO TABS: 100.0000 mg | ORAL_TABLET | Freq: Three times a day (TID) | ORAL | 3 refills | Status: DC

## 2020-06-21 MED ORDER — CLOPIDOGREL BISULFATE 75 MG PO TABS: 75.0000 mg | ORAL_TABLET | Freq: Every day | ORAL | 12 refills | Status: AC

## 2020-06-21 MED FILL — Heparin Sod (Porcine)-NaCl IV Soln 1000 Unit/500ML-0.9%: INTRAVENOUS | Qty: 1000 | Status: AC

## 2020-06-21 NOTE — Discharge Summary (Signed)
Physician Discharge Summary  Jeremiah [name] LKG:[id number] DOB: 1946-07-22 DOA: 06/18/2020  PCP: System, Pcp Not In  Admit date: 06/18/2020 Discharge date: 06/21/2020  Time spent: 45 minutes  Recommendations for Outpatient Follow-up:  1. Will need aspirin Plavix as per Dr. Bing Matter by vascular surgery depending on their needed duration (usually 1 year for both) 2. Recommend outpatient echocardiogram to be coordinated by cardiologist given increase in aortic stenosis as below 3. Recommend Chem-12, CBC 1 to 2 weeks given mild azotemia this admission 4. Will require A1c in 3 months 5. Please screen for dementia in the outpatient setting-slight memory deficits noted during interactions in the hospital  Discharge Diagnoses:  Principal Problem:   Gangrene (HCC) Active Problems:   PVD (peripheral vascular disease) (HCC)   S/P AKA (above knee amputation) (HCC)   Diabetes mellitus type 2 in nonobese (HCC)   Dyslipidemia   Type 2 diabetes mellitus with peripheral neuropathy (HCC)   Benign essential HTN   Discharge Condition: Improved heart healthy   Diet recommendation: Diabetic  Filed Weights   06/19/20 1046  Weight: 72.6 kg    History of present illness:  13 AAM PAD s/p AKA R sided 11/09/2018 HFpEF Complicated cholecystotomy + drain + xanthogranuloma cholecystitis and malnourishment with drain performed finally 05/28/2018             Resulting C. difficile 06/06/2018 after surgery DM TY 2 Hyperlipidemia OSA not compliant on CPAP  Admitted from office 2/2 nonhealing left heel ulcer-was not septic on admission Vascular surgeon evaluated patient and aortogram performed 7/28  Hospital Course:  1. Nonhealing left heel ulcer a. Aortogram performed-had intervention with stent popliteal area-please see note from Dr. Chestine Spore b. Continue both aspirin and Plavix on discharge-Long discussion with son [name] on telephone who seems to understand as patient himself has a little bit of  difficulty understanding c. Therapy recommends home health PT and I have ordered a wound nurse to come out in addition to help although he does have an eschar 2. 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 This is represents a change from prior b. Discussed with Ms. [name], PA for cardiology who will set up outpatient follow-up and repeat echo as no current indication from their perspective for emergent perspective of surgery 3. HFpEF Echo shows EF 70 to 75% hyperdynamic without acute component a. Have discontinued amlodipine secondary to increase in stenosis gradient continue cautiously metoprolol XL 50 continue hydralazine at a higher dose of 100 every 8 b. Seems euvolemic at this time no need for diuretics c. Saline locked on discharge d. Outpatient follow-up as discussed above 4. DM TY 2 a. Mildly hypoglycemic this morning in the 80s b. Saline lock D5 and preference for normal saline infusion as post-cath c. Was on sensitive sliding scale and Lantus and this was discontinued in favor of Amaryl on discharge which is his home med 5. [name] on CPAP a. Will need discussion regarding the same b. Might benefit from outpatient testing 6. Prior C. difficile colitis 7. BMI 23 8. Hyperlipidemia  Consultants:   Vascular  Procedures: Angiography Dr. Chestine Spore 7/28 Surgeon:Christopher Sondra Barges, MD Procedure Performed: 1.Ultrasound-guided access of right common femoral artery 2.Aortogram including catheter selection of aorta 3.Left lower extremity arteriogram with selection of third order branches 4.Left SFAand above knee popliteal arteryangioplasty with stent placement for long SFA chronic total occlusion (predilated with a 4 mm jade, primarily stented with a 6 mm x 120 mm drug-coated Eluviax3 and 6 mm x 80  mm drug-coated Eluvia,all stentspostdilated with a 5 mm Jade) 5.90 minutes of monitored moderate conscious sedationtime  Discharge Exam: Vitals:    06/21/20 0808 06/21/20 1000  BP: (!) 136/54 (!) 117/43  Pulse: 70 57  Resp: 18   Temp: 98.1 F (36.7 C)   SpO2: 98% 98%    General: Awake alert coherent but slightly hard of hearing-also does not seem to have difficulty with regards to what was done on him Poor dentition  Cardiovascular: S1-S2 no murmur rub or gallop Respiratory: Clinically clear no rales no rhonchi Abdomen soft nontender no rebound no guarding Left foot has a bandage on it it was reviewed this morning with dry eschar as per vascular surgeon He has a good pulse Neurologically is intact  Discharge Instructions   Discharge Instructions    Diet - low sodium heart healthy   Complete by: As directed    Discharge instructions   Complete by: As directed    Make sure that the patient is seen soon by Dr. [name] and vascular surgery-I will CC him on this note to ensure that he is aware Please call their office if you do not hear from them within 5-7 business days Get lab work in about 1 week We will get a nurse and home health therapy services to come out and help you with mobility I would recommend taking the new medications such as Plavix etc. as directed-report any bleeding or any dark stools to your regular doctor You will notice that some of your medications have changed including some your blood pressure meds The echocardiogram of your heart showed a little bit of leakage of some of the valves and you will need to be followed by a cardiologist in the outpatient setting-they should be calling you to ensure that a repeat echo is done You do not need antibiotics for your wound as it is dry You do however need follow-up with wound care at home   Discharge wound care:   Complete by: As directed    Wound / Incision (Open or Dehisced) 06/18/20 Diabetic ulcer Heel Left black,red, purulent drainage 2 days   Increase activity slowly   Complete by: As directed      Allergies as of 06/21/2020   No Known Allergies      Medication List    STOP taking these medications   amLODipine 10 MG tablet Commonly known as: NORVASC   losartan 100 MG tablet Commonly known as: COZAAR     TAKE these medications   acetaminophen 500 MG tablet Commonly known as: TYLENOL Take 1,000 mg by mouth every 6 (six) hours as needed for moderate pain or headache.   aspirin 81 MG tablet Take 81 mg by mouth daily.   atorvastatin 20 MG tablet Commonly known as: LIPITOR Take 1 tablet (20 mg total) by mouth daily.   brimonidine 0.2 % ophthalmic solution Commonly known as: ALPHAGAN Place 1 drop into both eyes 2 (two) times daily.   clopidogrel 75 MG tablet Commonly known as: PLAVIX Take 1 tablet (75 mg total) by mouth daily with breakfast. Start taking on: June 22, 2020   gabapentin 100 MG capsule Commonly known as: NEURONTIN TAKE 1 CAPSULE (100 MG TOTAL) BY MOUTH 3 (THREE) TIMES DAILY.   glimepiride 1 MG tablet Commonly known as: AMARYL Take 1 tablet (1 mg total) by mouth daily with breakfast.   glucose blood test strip Accu-Chek Aviva Plus test strips  Take 1 strip 3 times a day by miscell. route  for 90 days.   glucose blood test strip Accu-Chek Aviva Plus test strips  USE AS DIRECTED THREE TIMES DAILY.   hydrALAZINE 100 MG tablet Commonly known as: APRESOLINE Take 1 tablet (100 mg total) by mouth every 8 (eight) hours.   metoprolol succinate 50 MG 24 hr tablet Commonly known as: TOPROL-XL Take 50 mg by mouth daily.   timolol 0.5 % ophthalmic solution Commonly known as: TIMOPTIC Place 1 drop into both eyes 2 (two) times daily.   traMADol 50 MG tablet Commonly known as: ULTRAM Take 50 mg by mouth every 8 (eight) hours as needed for moderate pain.            Discharge Care Instructions  (From admission, onward)         Start     Ordered   06/21/20 0000  Discharge wound care:       Comments: Wound / Incision (Open or Dehisced) 06/18/20 Diabetic ulcer Heel Left black,red, purulent drainage 2  days   06/21/20 1109         No Known Allergies  Follow-up Information    [facility] Follow [facility].   Specialty: Home Health Services Why: [facility] will be providing you will continued services - they will call you in the next 24-48 hours after your discharge home to provide you with RN, Medical social work, physical therapy and occupational therapy.       Maeola Harman, MD Follow up in 4 week(s).   Specialties: Vascular Surgery, Cardiology Contact information: [address] [place] [address] [phone]                The results of significant diagnostics from this hospitalization (including imaging, microbiology, ancillary and laboratory) are listed below for reference.    Significant Diagnostic Studies: PERIPHERAL VASCULAR CATHETERIZATION  Result Date: 06/20/2020 Patient name: HAU SANOR         MRN: [id number]        DOB: Mar 22, 1946          Sex: male  06/20/2020 Pre-operative Diagnosis: Critical limb ischemia left lower extremity with heel wound Post-operative diagnosis:  Same Surgeon:  Cephus Shelling, MD Procedure Performed: 1.  Ultrasound-guided access of right common femoral artery 2.  Aortogram including catheter selection of aorta 3.  Left lower extremity arteriogram with selection of third order branches 4.  Left SFA and above knee popliteal artery angioplasty with stent placement for long SFA chronic total occlusion (predilated with a 4 mm jade, primarily stented with a 6 mm x 120 mm drug-coated Eluvia x3 and 6 mm x 80 mm drug-coated Eluvia, all stents postdilated with a 5 mm Jade) 5.  90 minutes of monitored moderate conscious sedation time  Indications: Patient is a 74 year old male who previously has been under the care of Dr. [name].  He presented with a left heel wound and was seen by Dr. Darrick Penna for consultation.  He presents today for planned arteriogram and possible intervention for limb salvage after risk  benefits discussed.  Findings:  Aortogram showed patent aortoiliac segment with no overt flow-limiting stenosis.  Does have some calcification in the left common iliac but he has a very good left femoral pulse and this does not appear flow-limiting.  Left lower extremity arteriogram shows a patent common femoral and profunda with a very diseased proximal SFA stump.  He then has a long segment SFA chronic total occlusion throughout the mid distal segment with reconstitution of the above-knee  popliteal artery and two vessel runoff via the peroneal and posterior tibial artery.  Ultimately I was able to cross left SFA above-knee popliteal occlusion and get back in the true lumen just above the knee joint.  This was all predilated with a 4 mm jade angioplasty balloon and then primarily stented with a 6 mm drug covered Eluvia stents and postdilated with a 5 mm angioplasty balloon.             Procedure:  The patient was identified in the holding area and taken to room 8.  The patient was then placed supine on the table and prepped and draped in the usual sterile fashion.  A time out was called.  Ultrasound was used to evaluate the right common femoral artery.  It was patent .  A digital ultrasound image was acquired.  A micropuncture needle was used to access the right common femoral artery under ultrasound guidance.  An 018 wire was advanced without resistance and a micropuncture sheath was placed.  The 018 wire was removed and a benson wire was placed.  The micropuncture sheath was exchanged for a 5 french sheath.  An omniflush catheter was advanced over the wire to the level of L-1.  An abdominal angiogram was obtained.  Next, using the omniflush catheter and a benson wire, the aortic bifurcation was crossed and the catheter was placed into theleft external iliac artery and left runoff was obtained.  Pertinent findings are noted above but he appeared to have long segment SFA occlusion with reconstitution of an  above-knee popliteal artery.  There was a proximal stump of SFA to work with although it was diseased.  That point time used a Rosen wire and exchanged for 6 [employer] sheath in the right groin over the aortic bifurcation.  Patient was given 100 units/kg heparin and ACT was checked to maintain greater than 250.  Subsequently used a Glidewire advantage with a 035 quick cross and cannulated the proximal SFA and after multiple manipulations including exchanging for a soft angled Glidewire was finally able to get across the SFA into the above-knee popliteal artery across the CTO.  Initially I was in a dissection distally in the above knee popliteal artery although the true lumen of the artery was filling suggesting these communicated.  I then ultimately used a multitude of maneuvers with the angled glidwire and finally got into the true lumen in the behind the knee popliteal artery and ultimately put a Rosen wire down into the below-knee popliteal artery after we confirmed we were in the true lumen.  I then used a long 4 mm jade angioplasty balloon and the entire SFA and above-knee popliteal artery was angioplastied to nominal pressure for 2 minutes.  I then planned to primarily stent and we deployed three 6 mm x 150 mm drug coated Eluvia stents and a 6 mm x 80 mm Eluvia from the above-knee popliteal artery all the way across Hunter's canal up to the takeoff of SFA at the ostium.  All of the stents were then postdilated with a 5 mm jade angioplasty balloon throughout the entire segment.  Final hand-injection after pulling the wire back showed excellent inline flow down the SFA stents with filling of the behind the knee and below-knee popliteal artery and preserved two-vessel runoff via the posterior tibial peroneal artery.  There is no evidence of residual dissection.  Wires and catheters were removed to be taken holding the sheath removed.  Plan: Patient will be loaded on  aspirin Plavix.  He now has inline flow  down the left lower extremity.  Cephus Shelling, MD [facility] Office: [phone]   DG Chest Port 1 View  Result Date: 06/18/2020 CLINICAL DATA:  74 year old male with necrotic foot wound. Possible sepsis. EXAM: PORTABLE CHEST 1 VIEW COMPARISON:  Chest CT 11/05/2018 and earlier. FINDINGS: Portable AP upright view at 1622 hours. Chronic right pleural effusion appears not significantly changed from December 2019. Stable lung volumes. Mediastinal contours remain within normal limits. Visualized tracheal air column is within normal limits. No superimposed pneumothorax or pulmonary edema. Allowing for portable technique the left lung is clear. Negative visible bowel gas pattern. No acute osseous abnormality identified. IMPRESSION: 1. Chronic right pleural effusion appears stable since December 2019. 2. No new cardiopulmonary abnormality. Electronically Signed   By: Odessa Fleming M.D.   On: 06/18/2020 16:35   DG Foot Complete Left  Result Date: 06/18/2020 CLINICAL DATA:  Diabetic foot ulcer. EXAM: LEFT FOOT - COMPLETE 3+ VIEW COMPARISON:  04/11/2020 FINDINGS: Open wound noted on the plantar aspect of the heel. No evidence of underlying osteomyelitis involving the calcaneus. Advanced osteoporosis noted and there are moderate forefoot, midfoot and hindfoot degenerative changes. No definite destructive bony lesions. Advanced vascular calcifications are noted. IMPRESSION: 1. Open wound on the plantar aspect of the heel. 2. No definite plain film findings for osteomyelitis. 3. Advanced osteoporosis and degenerative changes. Electronically Signed   By: Rudie Meyer M.D.   On: 06/18/2020 10:58   ECHOCARDIOGRAM COMPLETE  Result Date: 06/19/2020    ECHOCARDIOGRAM REPORT   Patient Name:   Jeremiah [name] Date of Exam: 06/19/2020 Medical Rec #:  [id number]       Height:       69.0 in Accession #:    [id number]      Weight:       160.0 lb Date of Birth:  03-14-1946       BSA:           1.879 m Patient Age:    74 years        BP:           127/45 mmHg Patient Gender: M               HR:           57 bpm. Exam Location:  Inpatient Procedure: 2D Echo, Cardiac Doppler and Color Doppler Indications:    I50.31 Acute diastolic (congestive) heart failure  History:        Patient has prior history of Echocardiogram examinations, most                 recent 03/28/2018. Aortic Valve Disease, Signs/Symptoms:Altered                 Mental Status and Fever; Risk Factors:Hypertension, Diabetes and                 Dyslipidemia. Aortic stenosis.  Sonographer:    Sheralyn Boatman RDCS Referring Phys: [id number] [place] [facility]  Sonographer Comments: Technically difficult study due to poor echo windows. IMPRESSIONS  1. Left ventricular ejection fraction, by estimation, is 70 to 75%. The left ventricle has hyperdynamic function. The left ventricle has no regional wall motion abnormalities. There is moderate left ventricular hypertrophy. Left ventricular diastolic parameters are consistent with Grade I diastolic dysfunction (impaired relaxation).  2. Right ventricular systolic function is normal. The right ventricular size is normal.  3. The mitral  valve is normal in structure. No evidence of mitral valve regurgitation. No evidence of mitral stenosis.  4. The aortic valve has an indeterminant number of cusps. Aortic valve regurgitation is not visualized. Moderate aortic valve stenosis. Aortic valve area, by VTI measures 1.34 cm. Aortic valve mean gradient measures 23.8 mmHg. Aortic valve Vmax measures 3.27 m/s.  5. The inferior vena cava is normal in size with greater than 50% respiratory variability, suggesting right atrial pressure of 3 mmHg. Comparison(s): Aortic stenosis has advanced (prior mean gradient 16 mmHg). FINDINGS  Left Ventricle: Left ventricular ejection fraction, by estimation, is 70 to 75%. The left ventricle has hyperdynamic function. The left ventricle has no regional wall motion abnormalities. The left  ventricular internal cavity size was normal in size. There is moderate left ventricular hypertrophy. Left ventricular diastolic parameters are consistent with Grade I diastolic dysfunction (impaired relaxation). Right Ventricle: The right ventricular size is normal. No increase in right ventricular wall thickness. Right ventricular systolic function is normal. Left Atrium: Left atrial size was normal in size. Right Atrium: Right atrial size was normal in size. Pericardium: There is no evidence of pericardial effusion. Mitral Valve: The mitral valve is normal in structure. Normal mobility of the mitral valve leaflets. No evidence of mitral valve regurgitation. No evidence of mitral valve stenosis. Tricuspid Valve: The tricuspid valve is normal in structure. Tricuspid valve regurgitation is not demonstrated. No evidence of tricuspid stenosis. Aortic Valve: The aortic valve has an indeterminant number of cusps. . There is moderate thickening and moderate calcification of the aortic valve. Aortic valve regurgitation is not visualized. Moderate aortic stenosis is present. There is moderate thickening of the aortic valve. There is moderate calcification of the aortic valve. Aortic valve mean gradient measures 23.8 mmHg. Aortic valve peak gradient measures 42.7 mmHg. Aortic valve area, by VTI measures 1.34 cm. Pulmonic Valve: The pulmonic valve was normal in structure. Pulmonic valve regurgitation is not visualized. No evidence of pulmonic stenosis. Aorta: The aortic root is normal in size and structure. Venous: The inferior vena cava is normal in size with greater than 50% respiratory variability, suggesting right atrial pressure of 3 mmHg. IAS/Shunts: No atrial level shunt detected by color flow Doppler.  LEFT VENTRICLE PLAX 2D LVIDd:         4.00 cm     Diastology LVIDs:         2.50 cm     LV e' lateral:   6.20 cm/s LV PW:         1.50 cm     LV E/e' lateral: 16.3 LV IVS:        1.40 cm     LV e' medial:    6.31 cm/s  LVOT diam:     1.80 cm     LV E/e' medial:  16.0 LV SV:         104 LV SV Index:   55 LVOT Area:     2.54 cm  LV Volumes (MOD) LV vol d, MOD A2C: 85.2 ml LV vol d, MOD A4C: 68.9 ml LV vol s, MOD A2C: 24.4 ml LV vol s, MOD A4C: 23.3 ml LV SV MOD A2C:     60.8 ml LV SV MOD A4C:     68.9 ml LV SV MOD BP:      51.9 ml RIGHT VENTRICLE             IVC RV S prime:     12.50 cm/s  IVC diam: 1.80 cm  TAPSE (M-mode): 2.9 cm LEFT ATRIUM           Index       RIGHT ATRIUM           Index LA diam:      3.20 cm 1.70 cm/m  RA Area:     11.20 cm LA Vol (A2C): 24.8 ml 13.20 ml/m RA Volume:   22.60 ml  12.03 ml/m LA Vol (A4C): 35.0 ml 18.63 ml/m  AORTIC VALVE AV Area (Vmax):    1.55 cm AV Area (Vmean):   1.30 cm AV Area (VTI):     1.34 cm AV Vmax:           326.60 cm/s AV Vmean:          226.400 cm/s AV VTI:            0.774 m AV Peak Grad:      42.7 mmHg AV Mean Grad:      23.8 mmHg LVOT Vmax:         199.00 cm/s LVOT Vmean:        116.000 cm/s LVOT VTI:          0.408 m LVOT/AV VTI ratio: 0.53  AORTA Ao Root diam: 2.80 cm MITRAL VALVE MV Area (PHT): 2.56 cm     SHUNTS MV Decel Time: 296 msec     Systemic VTI:  0.41 m MV E velocity: 101.00 cm/s  Systemic Diam: 1.80 cm MV A velocity: 130.00 cm/s MV E/A ratio:  0.78 Donato Schultz MD Electronically signed by Donato Schultz MD Signature Date/Time: 06/19/2020/12:21:07 PM    Final     Microbiology: Recent Results (from the past 240 hour(s))  Blood culture (routine x 2)     Status: None (Preliminary result)   Collection Time: 06/18/20  1:00 PM   Specimen: BLOOD LEFT HAND  Result Value Ref Range Status   Specimen Description BLOOD LEFT HAND  Final   Special Requests   Final    BOTTLES DRAWN AEROBIC ONLY Blood Culture results may not be optimal due to an inadequate volume of blood received in culture bottles   Culture   Final    NO GROWTH < 24 HOURS Performed at [facility], [address] [place], [place] [address]    Report Status PENDING  Incomplete  Blood  culture (routine x 2)     Status: None (Preliminary result)   Collection Time: 06/18/20  1:07 PM   Specimen: BLOOD  Result Value Ref Range Status   Specimen Description BLOOD SITE NOT SPECIFIED  Final   Special Requests   Final    BOTTLES DRAWN AEROBIC ONLY Blood Culture adequate volume   Culture   Final    NO GROWTH < 24 HOURS Performed at [facility], [address], [place], [place] [address]    Report Status PENDING  Incomplete  SARS Coronavirus 2 by RT PCR (hospital order, performed in [facility] [place] hospital lab) Nasopharyngeal Nasopharyngeal Swab     Status: None   Collection Time: 06/18/20  4:00 PM   Specimen: Nasopharyngeal Swab  Result Value Ref Range Status   SARS Coronavirus 2 NEGATIVE NEGATIVE Final    Comment: (NOTE) SARS-CoV-2 target nucleic acids are NOT DETECTED.  The SARS-CoV-2 RNA is generally detectable in upper and lower respiratory specimens during the acute phase of infection. The lowest concentration of SARS-CoV-2 viral copies this assay can detect is 250 copies / mL. A negative result does not preclude  SARS-CoV-2 infection and should not be used as the sole basis for treatment or other patient management decisions.  A negative result may occur with improper specimen collection / handling, submission of specimen other than nasopharyngeal swab, presence of viral mutation(s) within the areas targeted by this assay, and inadequate number of viral copies (<250 copies / mL). A negative result must be combined with clinical observations, patient history, and epidemiological information.  Fact Sheet for Patients:   BoilerBrush.com.cyhttps://www.fda.gov/media/136312/download  Fact Sheet for Healthcare Providers: https://pope.com/https://www.fda.gov/media/136313/download  This test is not yet approved or  cleared by the [place] FDA and has been authorized for detection and/or diagnosis of SARS-CoV-2 by FDA under an Emergency Use Authorization (EUA).  This EUA will remain in effect  (meaning this test can be used) for the duration of the COVID-19 declaration under Section 564(b)(1) of the Act, 21 U.S.C. section 360bbb-3(b)(1), unless the authorization is terminated or revoked sooner.  Performed at [facility], [address] [place], [place] [address]   MRSA PCR Screening     Status: None   Collection Time: 06/18/20  4:00 PM   Specimen: Nasopharyngeal Wash  Result Value Ref Range Status   MRSA by PCR NEGATIVE NEGATIVE Final    Comment:        The GeneXpert MRSA Assay (FDA approved for NASAL specimens only), is one component of a comprehensive MRSA colonization surveillance program. It is not intended to diagnose MRSA infection nor to guide or monitor treatment for MRSA infections. Performed at [facility], [address] [place], [place] [address]   Surgical pcr screen     Status: None   Collection Time: 06/18/20  9:44 PM   Specimen: Nasal Mucosa; Nasal Swab  Result Value Ref Range Status   MRSA, PCR NEGATIVE NEGATIVE Final   Staphylococcus aureus NEGATIVE NEGATIVE Final    Comment: (NOTE) The Xpert SA Assay (FDA approved for NASAL specimens in patients 76 years of age and older), is one component of a comprehensive surveillance program. It is not intended to diagnose infection nor to guide or monitor treatment. Performed at [facility], [address] [place], [place] [address]      Labs: Basic Metabolic Panel: Recent Labs  Lab 06/18/20 1041 06/18/20 1559 06/18/20 1625 06/19/20 0303 06/20/20 0341 06/21/20 0250  NA 136  --   --  138 138 137  K 3.6  --   --  3.3* 4.3 4.0  CL 101  --   --  103 105 104  CO2 25  --   --  26 25 23   GLUCOSE 111*  --   --  94 90 106*  BUN 17  --   --  17 17 16   CREATININE 1.39*  --  1.31* 1.45* 1.45* 1.37*  CALCIUM 9.0  --   --  8.6* 8.9 8.8*  MG  --  1.9  --  1.8 1.9 1.8   Liver Function Tests: Recent Labs  Lab 06/18/20 1041 06/19/20 0303 06/20/20 0341 06/21/20 0250  AST 14*  13* 14* 12*  ALT 15 12 14 10   ALKPHOS 99 88 84 88  BILITOT 0.7 0.4 0.5 0.5  PROT 7.8 7.0 7.0 6.7  ALBUMIN 2.7* 2.4* 2.4* 2.4*   No results for input(s): LIPASE, AMYLASE in the last 168 hours. No results for input(s): AMMONIA in the last 168 hours. CBC: Recent Labs  Lab 06/18/20 1041 06/18/20 1625 06/19/20 0303 06/20/20 0341 06/21/20 0250  WBC 11.1*  11.5* 10.1 11.0* 11.7*  NEUTROABS 7.5  --  5.4 5.5 7.1  HGB 9.6* 9.4* 8.4* 8.2* 8.1*  HCT 29.9* 30.0* 25.4* 25.5* 24.7*  MCV 83.8 84.0 82.5 83.6 83.7  PLT 255 239 239 215 210   Cardiac Enzymes: No results for input(s): CKTOTAL, CKMB, CKMBINDEX, TROPONINI in the last 168 hours. BNP: BNP (last 3 results) Recent Labs    06/19/20 0303 06/20/20 0341 06/21/20 0250  BNP 175.2* 244.0* 333.5*    ProBNP (last 3 results) No results for input(s): PROBNP in the last 8760 hours.  CBG: Recent Labs  Lab 06/20/20 0649 06/20/20 0934 06/20/20 1223 06/20/20 2108 06/21/20 0616  GLUCAP 85 89 90 171* 81       Signed:  Rhetta Mura MD   [facility] 06/21/2020, 11:09 AM

## 2020-06-21 NOTE — Progress Notes (Signed)
OT Cancellation Note  Patient Details Name: [name] MRN: [id number] DOB: 08-01-1946   Cancelled Treatment:    Reason Eval/Treat Not Completed: Patient declined, reports he is going home today. Politely declined any questions/concerns to address with OT at this time.   Marlyce Huge OT OT office: [phone]   Carmelia Roller 06/21/2020, 11:36 AM

## 2020-06-21 NOTE — Progress Notes (Addendum)
[facility]  Subjective  - No new complaint.  States his left leg feels better.   Objective (!) 150/55 55 98 F (36.7 C) (Oral) 17 98%  Intake/Output Summary (Last 24 hours) at 06/21/2020 9476 Last data filed at 06/21/2020 0558 Gross per 24 hour  Intake --  Output 800 ml  Net -800 ml    Doppler signal left PT Dressing clean and dry Right groin soft without hematoma Lungs non labored breathing  Assessment/Planning: POD # 1 Left SFA and above knee popliteal artery angioplasty with stent placement for long SFA chronic total occlusion (predilated with a 4 mm jade, primarily stented with a 6 mm x 120 mm drug-coated Eluvia x3 and 6 mm x 80 mm drug-coated Eluvia, all stents postdilated with a 5 mm Jade)  Patent in line flow with PT signal Encouraged mobility  Will maintain dry dressing to heel wound Cr decreasing now 1.37  Mosetta Pigeon 06/21/2020 7:21 AM --  Dry left Heel eschar.  No groin hematoma. S/p SFA pop stent yesterday He can go home from our standpoint He needs follow up with Dr [name] in 2-3 weeks to check left foot.  Fabienne Bruns, MD [facility] Office: [phone]   Laboratory Lab Results: Recent Labs    06/20/20 0341 06/21/20 0250  WBC 11.0* 11.7*  HGB 8.2* 8.1*  HCT 25.5* 24.7*  PLT 215 210   BMET Recent Labs    06/20/20 0341 06/21/20 0250  NA 138 137  K 4.3 4.0  CL 105 104  CO2 25 23  GLUCOSE 90 106*  BUN 17 16  CREATININE 1.45* 1.37*  CALCIUM 8.9 8.8*    COAG Lab Results  Component Value Date   INR 1.1 06/18/2020   INR 1.20 11/08/2018   INR 1.01 01/31/2018   No results found for: PTT

## 2020-06-23 LAB — CULTURE, BLOOD (ROUTINE X 2)
Culture: NO GROWTH
Culture: NO GROWTH
Special Requests: ADEQUATE

## 2020-06-25 ENCOUNTER — Other Ambulatory Visit: Payer: Self-pay

## 2020-06-25 ENCOUNTER — Emergency Department (HOSPITAL_COMMUNITY): Payer: Medicare Other

## 2020-06-25 ENCOUNTER — Inpatient Hospital Stay (HOSPITAL_COMMUNITY)
Admission: EM | Admit: 2020-06-25 | Discharge: 2020-06-28 | DRG: 194 | Disposition: A | Discharge status: Home / Self Care | Payer: Medicare Other | Attending: Internal Medicine | Admitting: Internal Medicine

## 2020-06-25 ENCOUNTER — Inpatient Hospital Stay (HOSPITAL_COMMUNITY): Payer: Medicare Other

## 2020-06-25 DIAGNOSIS — E1122 Type 2 diabetes mellitus with diabetic chronic kidney disease: Secondary | ICD-10-CM | POA: Diagnosis present

## 2020-06-25 DIAGNOSIS — Z89611 Acquired absence of right leg above knee: Secondary | ICD-10-CM

## 2020-06-25 DIAGNOSIS — Z95820 Peripheral vascular angioplasty status with implants and grafts: Secondary | ICD-10-CM | POA: Diagnosis not present

## 2020-06-25 DIAGNOSIS — I5032 Chronic diastolic (congestive) heart failure: Secondary | ICD-10-CM | POA: Diagnosis present

## 2020-06-25 DIAGNOSIS — Z833 Family history of diabetes mellitus: Secondary | ICD-10-CM | POA: Diagnosis not present

## 2020-06-25 DIAGNOSIS — Z20822 Contact with and (suspected) exposure to covid-19: Secondary | ICD-10-CM | POA: Diagnosis present

## 2020-06-25 DIAGNOSIS — Z7982 Long term (current) use of aspirin: Secondary | ICD-10-CM | POA: Diagnosis not present

## 2020-06-25 DIAGNOSIS — R5383 Other fatigue: Secondary | ICD-10-CM

## 2020-06-25 DIAGNOSIS — G546 Phantom limb syndrome with pain: Secondary | ICD-10-CM | POA: Diagnosis present

## 2020-06-25 DIAGNOSIS — I130 Hypertensive heart and chronic kidney disease with heart failure and stage 1 through stage 4 chronic kidney disease, or unspecified chronic kidney disease: Secondary | ICD-10-CM | POA: Diagnosis present

## 2020-06-25 DIAGNOSIS — Z7902 Long term (current) use of antithrombotics/antiplatelets: Secondary | ICD-10-CM | POA: Diagnosis not present

## 2020-06-25 DIAGNOSIS — J189 Pneumonia, unspecified organism: Principal | ICD-10-CM | POA: Diagnosis present

## 2020-06-25 DIAGNOSIS — L89620 Pressure ulcer of left heel, unstageable: Secondary | ICD-10-CM | POA: Diagnosis present

## 2020-06-25 DIAGNOSIS — R52 Pain, unspecified: Secondary | ICD-10-CM

## 2020-06-25 DIAGNOSIS — Z79899 Other long term (current) drug therapy: Secondary | ICD-10-CM

## 2020-06-25 DIAGNOSIS — R531 Weakness: Secondary | ICD-10-CM | POA: Diagnosis present

## 2020-06-25 DIAGNOSIS — E1151 Type 2 diabetes mellitus with diabetic peripheral angiopathy without gangrene: Secondary | ICD-10-CM | POA: Diagnosis present

## 2020-06-25 DIAGNOSIS — Z9119 Patient's noncompliance with other medical treatment and regimen: Secondary | ICD-10-CM | POA: Diagnosis not present

## 2020-06-25 DIAGNOSIS — R509 Fever, unspecified: Secondary | ICD-10-CM | POA: Diagnosis not present

## 2020-06-25 DIAGNOSIS — E871 Hypo-osmolality and hyponatremia: Secondary | ICD-10-CM | POA: Diagnosis not present

## 2020-06-25 DIAGNOSIS — Z87891 Personal history of nicotine dependence: Secondary | ICD-10-CM | POA: Diagnosis not present

## 2020-06-25 DIAGNOSIS — I350 Nonrheumatic aortic (valve) stenosis: Secondary | ICD-10-CM | POA: Diagnosis present

## 2020-06-25 DIAGNOSIS — D519 Vitamin B12 deficiency anemia, unspecified: Secondary | ICD-10-CM | POA: Diagnosis present

## 2020-06-25 DIAGNOSIS — N12 Tubulo-interstitial nephritis, not specified as acute or chronic: Secondary | ICD-10-CM | POA: Diagnosis present

## 2020-06-25 DIAGNOSIS — N182 Chronic kidney disease, stage 2 (mild): Secondary | ICD-10-CM | POA: Diagnosis present

## 2020-06-25 DIAGNOSIS — R109 Unspecified abdominal pain: Secondary | ICD-10-CM

## 2020-06-25 DIAGNOSIS — N390 Urinary tract infection, site not specified: Secondary | ICD-10-CM | POA: Diagnosis not present

## 2020-06-25 DIAGNOSIS — N179 Acute kidney failure, unspecified: Secondary | ICD-10-CM | POA: Diagnosis present

## 2020-06-25 DIAGNOSIS — Z6823 Body mass index (BMI) 23.0-23.9, adult: Secondary | ICD-10-CM | POA: Diagnosis not present

## 2020-06-25 DIAGNOSIS — E440 Moderate protein-calorie malnutrition: Secondary | ICD-10-CM | POA: Diagnosis present

## 2020-06-25 DIAGNOSIS — J9601 Acute respiratory failure with hypoxia: Secondary | ICD-10-CM | POA: Diagnosis present

## 2020-06-25 DIAGNOSIS — Z8249 Family history of ischemic heart disease and other diseases of the circulatory system: Secondary | ICD-10-CM

## 2020-06-25 LAB — URINALYSIS, ROUTINE W REFLEX MICROSCOPIC
Bilirubin Urine: NEGATIVE
Glucose, UA: NEGATIVE mg/dL
Ketones, ur: NEGATIVE mg/dL
Nitrite: NEGATIVE
Protein, ur: 100 mg/dL — AB
Specific Gravity, Urine: 1.013 (ref 1.005–1.030)
WBC, UA: >50 WBC/hpf — ABNORMAL HIGH (ref 0–5)
pH: 5 (ref 5.0–8.0)

## 2020-06-25 LAB — CBC WITH DIFFERENTIAL/PLATELET
Abs Immature Granulocytes: 0.18 10*3/uL — ABNORMAL HIGH (ref 0.00–0.07)
Basophils Absolute: 0.1 10*3/uL (ref 0.0–0.1)
Basophils Relative: 0 %
Eosinophils Absolute: 0.3 10*3/uL (ref 0.0–0.5)
Eosinophils Relative: 1 %
HCT: 24.4 % — ABNORMAL LOW (ref 39.0–52.0)
Hemoglobin: 7.7 g/dL — ABNORMAL LOW (ref 13.0–17.0)
Immature Granulocytes: 1 %
Lymphocytes Relative: 6 %
Lymphs Abs: 1.4 10*3/uL (ref 0.7–4.0)
MCH: 26.8 pg (ref 26.0–34.0)
MCHC: 31.6 g/dL (ref 30.0–36.0)
MCV: 85 fL (ref 80.0–100.0)
Monocytes Absolute: 1.9 10*3/uL — ABNORMAL HIGH (ref 0.1–1.0)
Monocytes Relative: 9 %
Neutro Abs: 18.7 10*3/uL — ABNORMAL HIGH (ref 1.7–7.7)
Neutrophils Relative %: 83 %
Platelets: 253 10*3/uL (ref 150–400)
RBC: 2.87 MIL/uL — ABNORMAL LOW (ref 4.22–5.81)
RDW: 14.6 % (ref 11.5–15.5)
WBC: 22.5 10*3/uL — ABNORMAL HIGH (ref 4.0–10.5)
nRBC: 0 % (ref 0.0–0.2)

## 2020-06-25 LAB — PROTIME-INR
INR: 1.2 (ref 0.8–1.2)
Prothrombin Time: 14.3 seconds (ref 11.4–15.2)

## 2020-06-25 LAB — COMPREHENSIVE METABOLIC PANEL
ALT: 12 U/L (ref 0–44)
AST: 16 U/L (ref 15–41)
Albumin: 2.1 g/dL — ABNORMAL LOW (ref 3.5–5.0)
Alkaline Phosphatase: 79 U/L (ref 38–126)
Anion gap: 10 (ref 5–15)
BUN: 18 mg/dL (ref 8–23)
CO2: 25 mmol/L (ref 22–32)
Calcium: 8.5 mg/dL — ABNORMAL LOW (ref 8.9–10.3)
Chloride: 101 mmol/L (ref 98–111)
Creatinine, Ser: 1.69 mg/dL — ABNORMAL HIGH (ref 0.61–1.24)
GFR calc Af Amer: 45 mL/min — ABNORMAL LOW (ref >60)
GFR calc non Af Amer: 39 mL/min — ABNORMAL LOW (ref >60)
Glucose, Bld: 134 mg/dL — ABNORMAL HIGH (ref 70–99)
Potassium: 3.8 mmol/L (ref 3.5–5.1)
Sodium: 136 mmol/L (ref 135–145)
Total Bilirubin: 0.5 mg/dL (ref 0.3–1.2)
Total Protein: 6.8 g/dL (ref 6.5–8.1)

## 2020-06-25 LAB — GLUCOSE, CAPILLARY: Glucose-Capillary: 169 mg/dL — ABNORMAL HIGH (ref 70–99)

## 2020-06-25 LAB — SARS CORONAVIRUS 2 BY RT PCR (HOSPITAL ORDER, PERFORMED IN ~~LOC~~ HOSPITAL LAB): SARS Coronavirus 2: NEGATIVE

## 2020-06-25 LAB — LACTIC ACID, PLASMA
Lactic Acid, Venous: 0.8 mmol/L (ref 0.5–1.9)
Lactic Acid, Venous: 1.2 mmol/L (ref 0.5–1.9)

## 2020-06-25 MED ORDER — METOPROLOL TARTRATE 25 MG PO TABS
25.0000 mg | ORAL_TABLET | Freq: Two times a day (BID) | ORAL | Status: DC
  Administered 2020-06-25 – 2020-06-27 (×5): 25 mg via ORAL
  Filled 2020-06-25 (×6): qty 1

## 2020-06-25 MED ORDER — BRIMONIDINE TARTRATE 0.2 % OP SOLN
1.0000 [drp] | Freq: Two times a day (BID) | OPHTHALMIC | Status: DC
  Administered 2020-06-26 – 2020-06-27 (×4): 1 [drp] via OPHTHALMIC
  Filled 2020-06-25: qty 5

## 2020-06-25 MED ORDER — ENOXAPARIN SODIUM 40 MG/0.4ML ~~LOC~~ SOLN: 40.0000 mg | SUBCUTANEOUS | Status: DC

## 2020-06-25 MED ORDER — TIMOLOL MALEATE 0.5 % OP SOLN
1.0000 [drp] | Freq: Two times a day (BID) | OPHTHALMIC | Status: DC
  Administered 2020-06-26 – 2020-06-27 (×4): 1 [drp] via OPHTHALMIC
  Filled 2020-06-25: qty 5

## 2020-06-25 MED ORDER — HYDRALAZINE HCL 25 MG PO TABS: 25.0000 mg | ORAL_TABLET | Freq: Four times a day (QID) | ORAL | Status: DC | PRN

## 2020-06-25 MED ORDER — ASPIRIN EC 81 MG PO TBEC
81.0000 mg | DELAYED_RELEASE_TABLET | Freq: Every day | ORAL | Status: DC
  Administered 2020-06-25 – 2020-06-27 (×3): 81 mg via ORAL
  Filled 2020-06-25 (×4): qty 1

## 2020-06-25 MED ORDER — SODIUM CHLORIDE 0.9 % IV SOLN
1.0000 g | INTRAVENOUS | Status: DC
  Administered 2020-06-26 – 2020-06-27 (×2): 1 g via INTRAVENOUS
  Filled 2020-06-25: qty 1
  Filled 2020-06-25 (×2): qty 10

## 2020-06-25 MED ORDER — SODIUM CHLORIDE 0.9 % IV SOLN
1.0000 g | Freq: Once | INTRAVENOUS | Status: AC
  Administered 2020-06-25: 1 g via INTRAVENOUS
  Filled 2020-06-25: qty 10

## 2020-06-25 MED ORDER — CLOPIDOGREL BISULFATE 75 MG PO TABS
75.0000 mg | ORAL_TABLET | Freq: Every day | ORAL | Status: DC
  Administered 2020-06-26 – 2020-06-28 (×3): 75 mg via ORAL
  Filled 2020-06-25 (×3): qty 1

## 2020-06-25 MED ORDER — HEPARIN SODIUM (PORCINE) 5000 UNIT/ML IJ SOLN
5000.0000 [IU] | Freq: Two times a day (BID) | INTRAMUSCULAR | Status: DC
  Administered 2020-06-25 – 2020-06-27 (×5): 5000 [IU] via SUBCUTANEOUS
  Filled 2020-06-25 (×6): qty 1

## 2020-06-25 MED ORDER — GABAPENTIN 100 MG PO CAPS
100.0000 mg | ORAL_CAPSULE | Freq: Three times a day (TID) | ORAL | Status: DC | PRN
  Administered 2020-06-25 – 2020-06-26 (×2): 100 mg via ORAL
  Filled 2020-06-25 (×2): qty 1

## 2020-06-25 MED ORDER — SODIUM CHLORIDE 0.9 % IV SOLN
500.0000 mg | Freq: Once | INTRAVENOUS | Status: AC
  Administered 2020-06-25: 500 mg via INTRAVENOUS
  Filled 2020-06-25: qty 500

## 2020-06-25 MED ORDER — ACETAMINOPHEN 500 MG PO TABS
1000.0000 mg | ORAL_TABLET | Freq: Four times a day (QID) | ORAL | Status: DC | PRN
  Administered 2020-06-25: 1000 mg via ORAL
  Filled 2020-06-25: qty 2

## 2020-06-25 MED ORDER — INSULIN ASPART 100 UNIT/ML ~~LOC~~ SOLN
0.0000 [IU] | Freq: Three times a day (TID) | SUBCUTANEOUS | Status: DC
  Administered 2020-06-26 – 2020-06-27 (×4): 1 [IU] via SUBCUTANEOUS

## 2020-06-25 MED ORDER — TRAMADOL HCL 50 MG PO TABS
50.0000 mg | ORAL_TABLET | Freq: Three times a day (TID) | ORAL | Status: DC | PRN
  Administered 2020-06-25 – 2020-06-26 (×2): 50 mg via ORAL
  Filled 2020-06-25 (×2): qty 1

## 2020-06-25 MED ORDER — ATORVASTATIN CALCIUM 10 MG PO TABS
20.0000 mg | ORAL_TABLET | Freq: Every day | ORAL | Status: DC
  Administered 2020-06-25 – 2020-06-27 (×3): 20 mg via ORAL
  Filled 2020-06-25 (×4): qty 2

## 2020-06-25 NOTE — ED Notes (Signed)
Resident Gross at bedside

## 2020-06-25 NOTE — ED Notes (Signed)
Unable to obtain labs from IV  

## 2020-06-25 NOTE — ED Notes (Signed)
Phlebotomy at bedside.

## 2020-06-25 NOTE — ED Notes (Signed)
Patient transported to X-ray 

## 2020-06-25 NOTE — ED Provider Notes (Signed)
[facility] EMERGENCY DEPARTMENT Provider Note   CSN: [id number] Arrival date & time: 06/25/20  1509     History Chief Complaint  Patient presents with  . Altered Mental Status  . Fatigue  . Fever    RIC ROSENBERG is a 74 y.o. male.  HPI  Patient presents to emergency room from home via EMS.  Patient recently had admission for nonhealing left heel ulcer with popliteal stent placed by vascular surgery.  Subsequently discharged on 7/29.  Patient reportedly had fever, fatigue and issues with urination today.  Urinary incontinence which is new however patient denies any other urinary symptoms including dysuria, foul-smelling urine, etc.  Patient also complained of pain to the left leg and flank according to EMS however when asked, the patient denies any current pain at this time.  EMS also reports hypoxia with SPO2 89% on room air when they arrived however patient saturating well on room air on arrival to [facility].  On Plavix/aspirin status post intervention.  1 g Tylenol prior to arrival.  Additional collateral information was obtained.  Family reports the patient has had decreased p.o. intake for last 2 or 3 days.  Patient has also been generalized weak and not getting out of bed.  Fever was reported at 1-1.8 at home.  Patient was complaining of left foot pain for the      Past Medical History:  Diagnosis Date  . Amputee   . Aortic atherosclerosis (HCC)   . Arthritis    "joints; shoulders, knees, hands, back" (05/21/2018)  . Atherosclerosis of coronary artery   . C. difficile diarrhea 04/2018  . Diastolic CHF (HCC)   . Diverticulosis   . High cholesterol   . History of gout   . Hypertension   . IDA (iron deficiency anemia)    from referral Dr Darleene Cleaver  . Internal hemorrhoids   . Peripheral vascular disease (HCC)   . Pneumonia    "couple times" (05/21/2018)  . Sleep apnea    "has mask; won't use" (05/21/2018)  . Type II diabetes mellitus [facility])      Patient Active Problem List   Diagnosis Date Noted  . Acute lower UTI 06/25/2020  . Abdominal pain   . Gangrene (HCC) 06/18/2020  . Post-operative pain 04/21/2019  . Abnormality of gait 03/24/2019  . Hypoglycemia   . Hyperkalemia   . Hyponatremia   . Diabetic peripheral neuropathy (HCC)   . Labile blood glucose   . Enteritis due to Clostridium difficile   . Blood glucose abnormal   . Diarrhea   . Benign essential HTN   . Hypoalbuminemia due to protein-calorie malnutrition (HCC)   . Labile blood pressure   . 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 Postoperative pain   . Acute blood loss anemia   . Dyslipidemia   . Type 2 diabetes mellitus with peripheral neuropathy (HCC)   . S/P AKA (above knee amputation) (HCC) 11/12/2018  . Unilateral AKA, right (HCC)   . Hypertensive crisis   . Diabetes mellitus type 2 in nonobese (HCC)   . Pleural effusion 11/08/2018  . Fever   . Open wound of right foot   . Sepsis (HCC) 11/03/2018  . Altered mental status   . Acute cystitis without hematuria   . Leukocytosis   . Pressure injury of skin 07/08/2018  . Clostridium difficile colitis 07/07/2018  . Hypokalemia 07/07/2018  . HLD (hyperlipidemia) 07/07/2018  . Acute diverticulitis 06/04/2018  . Cholecystitis 05/24/2018  .  Cholecystitis with cholelithiasis 05/21/2018  . Acute systolic heart failure (HCC) 03/27/2018  . Diabetes mellitus without complication (HCC) 01/30/2018  . Hypertension 01/30/2018  . Acute cholecystitis 01/30/2018  . AKI (acute kidney injury) (HCC) 01/30/2018  . Normocytic anemia 01/30/2018  . PVD (peripheral vascular disease) (HCC) 09/05/2014    Past Surgical History:  Procedure Laterality Date  . ABDOMINAL AORTOGRAM W/LOWER EXTREMITY N/A 11/01/2018   Procedure: ABDOMINAL AORTOGRAM W/LOWER EXTREMITY;  Surgeon: Runell Gess, MD;  Location: [facility] INVASIVE CV LAB;  Service: Cardiovascular;  Laterality: N/A;  . ABDOMINAL AORTOGRAM W/LOWER EXTREMITY Left  06/20/2020   Procedure: ABDOMINAL AORTOGRAM W/LOWER EXTREMITY;  Surgeon: Cephus Shelling, MD;  Location: [facility] INVASIVE CV LAB;  Service: Cardiovascular;  Laterality: Left;  . AMPUTATION Right 11/09/2018   Procedure: RIGHT - AMPUTATION ABOVE KNEE;  Surgeon: Maeola Harman, MD;  Location: [facility] OR;  Service: Vascular;  Laterality: Right;  . CATARACT EXTRACTION W/ INTRAOCULAR LENS  IMPLANT, BILATERAL Bilateral   . CHOLECYSTECTOMY  05/21/2018   ATTEMPTED LAPAROSCOPIC CHOLECYSTECTOMY, OPEN DRAINAGE OF GALLBLADDER WITH BIOPSY  . CHOLECYSTECTOMY N/A 05/21/2018   Procedure: ATTEMPTED LAPAROSCOPIC CHOLECYSTECTOMY, OPEN DRAINAGE OF GALLBLADDER WITH BIOPSY;  Surgeon: Griselda Miner, MD;  Location: [facility] OR;  Service: General;  Laterality: N/A;  . COLONOSCOPY W/ POLYPECTOMY    . COLONOSCOPY WITH PROPOFOL N/A 09/16/2018   Procedure: COLONOSCOPY WITH PROPOFOL;  Surgeon: Charlott Rakes, MD;  Location: [facility] ENDOSCOPY;  Service: Endoscopy;  Laterality: N/A;  . 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 FLEXIBLE SIGMOIDOSCOPY N/A 04/20/2020   Procedure: FLEXIBLE SIGMOIDOSCOPY;  Surgeon: Meridee Score Netty Starring., MD;  Location: [facility];  Service: Gastroenterology;  Laterality: N/A;  Fecal Disimpaction  . IR CATHETER TUBE CHANGE  04/14/2018  . IR CHOLANGIOGRAM EXISTING TUBE  03/17/2018  . IR PERC CHOLECYSTOSTOMY  01/31/2018  . IR RADIOLOGIST EVAL & MGMT  03/02/2018  . PERIPHERAL VASCULAR INTERVENTION Left 06/20/2020   Procedure: PERIPHERAL VASCULAR INTERVENTION;  Surgeon: Cephus Shelling, MD;  Location: [facility] INVASIVE CV LAB;  Service: Cardiovascular;  Laterality: Left;  4 SFA STENTS       Family History  Problem Relation Age of Onset  . Diabetes Mother   . Hypertension Mother   . Heart disease Father   . Heart attack Father   . Diabetes Sister   . Hypertension Sister   .  Diabetes Brother   . Heart disease Brother   . Hypertension Brother   . Heart attack Brother     Social History   Tobacco Use  . Smoking status: Former Smoker    Years: 24.00    Types: Cigarettes    Quit date: 11/25/1983    Years since quitting: 36.6  . Smokeless tobacco: Never Used  Vaping Use  . Vaping Use: Never used  Substance Use Topics  . Alcohol use: Not Currently  . Drug use: No    Home Medications Prior to Admission medications   Medication Sig Start Date End Date Taking? Authorizing Provider  acetaminophen (TYLENOL) 500 MG tablet Take 1,000 mg by mouth every 6 (six) hours as needed for moderate pain or headache.   Yes [provider]  aspirin 81 MG tablet Take 81 mg by mouth daily.   Yes [provider]  atorvastatin (LIPITOR) 20 MG tablet Take 1 tablet (20 mg total) by mouth daily. 12/06/18  Yes Angiulli, Mcarthur Rossetti, PA-C  brimonidine (ALPHAGAN) 0.2 % ophthalmic solution Place 1 drop into both eyes 2 (two) times daily. 12/06/18  Yes Angiulli, Mcarthur Rossettianiel J, PA-C  clopidogrel (PLAVIX) 75 MG tablet Take 1 tablet (75 mg total) by mouth daily with breakfast. 06/22/20  Yes Rhetta MuraSamtani, Jai-Gurmukh, MD  gabapentin (NEURONTIN) 100 MG capsule TAKE 1 CAPSULE (100 MG TOTAL) BY MOUTH 3 (THREE) TIMES DAILY. Patient taking differently: Take 100 mg by mouth 3 (three) times daily as needed (pain).  09/13/19  Yes Marcello FennelPatel, Ankit Anil, MD  glimepiride (AMARYL) 1 MG tablet Take 1 tablet (1 mg total) by mouth daily with breakfast. 12/06/18  Yes Angiulli, Mcarthur Rossettianiel J, PA-C  metoprolol succinate (TOPROL-XL) 50 MG 24 hr tablet Take 50 mg by mouth daily.  03/18/19  Yes [provider]  timolol (TIMOPTIC) 0.5 % ophthalmic solution Place 1 drop into both eyes 2 (two) times daily. 12/06/18  Yes Angiulli, Mcarthur Rossettianiel J, PA-C  traMADol (ULTRAM) 50 MG tablet Take 50 mg by mouth every 8 (eight) hours as needed for moderate pain.    Yes [provider]  glucose blood test strip Accu-Chek Aviva  Plus test strips  Take 1 strip 3 times a day by miscell. route for 90 days.    [provider]  glucose blood test strip Accu-Chek Aviva Plus test strips  USE AS DIRECTED THREE TIMES DAILY.    [provider]  hydrALAZINE (APRESOLINE) 100 MG tablet Take 1 tablet (100 mg total) by mouth every 8 (eight) hours. 06/21/20   Rhetta MuraSamtani, Jai-Gurmukh, MD    Allergies    Patient has no known allergies.  Review of Systems   Review of Systems  Constitutional: Positive for fatigue and fever. Negative for chills.  HENT: Negative for ear pain and sore throat.   Eyes: Negative for pain and visual disturbance.  Respiratory: Negative for cough and shortness of breath.   Cardiovascular: Negative for chest pain and palpitations.  Gastrointestinal: Negative for abdominal pain and vomiting.  Genitourinary: Negative for dysuria and hematuria.  Musculoskeletal: Negative for arthralgias and back pain.  Skin: Negative for color change and rash.  Neurological: Negative for seizures and syncope.  Psychiatric/Behavioral: Positive for confusion.  All other systems reviewed and are negative.   Physical Exam Updated Vital Signs BP 132/65 (BP Location: Right Arm)   Pulse 62   Temp 98.8 F (37.1 C) (Oral)   Resp 16   Ht 5\' 9"  (1.753 m)   Wt 72.6 kg   SpO2 96%   BMI 23.63 kg/m   Physical Exam Vitals and nursing note reviewed.  Constitutional:      General: He is not in acute distress.    Appearance: He is well-developed. He is ill-appearing. He is not toxic-appearing.  HENT:     Head: Normocephalic and atraumatic.  Eyes:     Extraocular Movements: Extraocular movements intact.     Conjunctiva/sclera: Conjunctivae normal.     Pupils: Pupils are equal, round, and reactive to light.  Cardiovascular:     Rate and Rhythm: Normal rate and regular rhythm.     Heart sounds: No murmur heard.   Pulmonary:     Effort: Pulmonary effort is normal. No respiratory distress.     Breath sounds:  Normal breath sounds.  Abdominal:     General: There is no distension.     Palpations: Abdomen is soft.     Tenderness: There is no abdominal tenderness.  Musculoskeletal:     Cervical back: Neck supple.  Legs:  Skin:    General: Skin is warm and dry.     Findings: Wound present.  Neurological:     Mental Status: He is alert. He is confused.     GCS: GCS eye subscore is 4. GCS verbal subscore is 5. GCS motor subscore is 6.     Motor: No weakness.     ED Results / Procedures / Treatments   Labs (all labs ordered are listed, but only abnormal results are displayed) Labs Reviewed  COMPREHENSIVE METABOLIC PANEL - Abnormal; Notable for the following components:      Result Value   Glucose, Bld 134 (*)    Creatinine, Ser 1.69 (*)    Calcium 8.5 (*)    Albumin 2.1 (*)    GFR calc non Af Amer 39 (*)    GFR calc Af Amer 45 (*)    All other components within normal limits  CBC WITH DIFFERENTIAL/PLATELET - Abnormal; Notable for the following components:   WBC 22.5 (*)    RBC 2.87 (*)    Hemoglobin 7.7 (*)    HCT 24.4 (*)    Neutro Abs 18.7 (*)    Monocytes Absolute 1.9 (*)    Abs Immature Granulocytes 0.18 (*)    All other components within normal limits  URINALYSIS, ROUTINE W REFLEX MICROSCOPIC - Abnormal; Notable for the following components:   Color, Urine AMBER (*)    APPearance CLOUDY (*)    Hgb urine dipstick MODERATE (*)    Protein, ur 100 (*)    Leukocytes,Ua LARGE (*)    WBC, UA >50 (*)    Bacteria, UA MANY (*)    All other components within normal limits  GLUCOSE, CAPILLARY - Abnormal; Notable for the following components:   Glucose-Capillary 169 (*)    All other components within normal limits  SARS CORONAVIRUS 2 BY RT PCR (HOSPITAL ORDER, PERFORMED IN [facility])  CULTURE, BLOOD (ROUTINE X 2)  CULTURE, BLOOD (ROUTINE X 2)  LACTIC ACID, PLASMA  LACTIC ACID, PLASMA  PROTIME-INR  BASIC METABOLIC PANEL  CBC    EKG EKG  Interpretation  Date/Time:  Monday June 25 2020 15:23:58 EDT Ventricular Rate:  70 PR Interval:    QRS Duration: 79 QT Interval:  399 QTC Calculation: 431 R Axis:   59 Text Interpretation: Sinus rhythm No significant change since last tracing Confirmed by Marily Memos [phone]) on 06/25/2020 3:51:37 PM   Radiology DG Chest 2 View  Result Date: 06/25/2020 CLINICAL DATA:  Sepsis EXAM: CHEST - 2 VIEW COMPARISON:  June 18, 2020 FINDINGS: Again noted is a chronic right-sided pleural effusion. However, there is increased attenuation throughout the right mid and right upper lung zones. The heart size is stable. Aortic calcifications are again noted. There is no definite acute osseous abnormality. There is no pneumothorax. IMPRESSION: 1. Increasing attenuation throughout the right mid and right upper lung zones may represent developing pneumonia. 2. Chronic right-sided pleural effusion. Electronically Signed   By: Katherine Mantle M.D.   On: 06/25/2020 16:04   CT Head Wo Contrast  Result Date: 06/25/2020 CLINICAL DATA:  74 year old male recently discharged following treatment for vascular disease, gangrene. Altered mental status. EXAM: CT HEAD WITHOUT CONTRAST TECHNIQUE: Contiguous axial images were obtained from the base of the skull through the vertex without intravenous contrast. COMPARISON:  Head CT 11/03/2018. FINDINGS: Brain: Stable cerebral volume since 2019. No midline shift, ventriculomegaly, mass effect, evidence of mass lesion, intracranial hemorrhage or evidence of cortically based  acute infarction. Small dystrophic calcification in the left pons is unchanged. Mild to moderate for age patchy bilateral white matter hypodensity has not significantly changed. No cortical encephalomalacia identified. Vascular: Calcified atherosclerosis at the skull base. No suspicious intracranial vascular hyperdensity. Skull: Chronic appearing changes to the maxillary alveolus likely from remote dental disease.  Absent maxillary dentition. No acute osseous abnormality identified. Sinuses/Orbits: Visualized paranasal sinuses and mastoids are stable and well pneumatized. Other: No acute orbit or scalp soft tissue finding. IMPRESSION: 1. No acute intracranial abnormality. 2. Chronic cerebral white matter disease not significantly changed since 2019. Electronically Signed   By: Odessa Fleming M.D.   On: 06/25/2020 16:19   US RENAL  Result Date: 06/25/2020 CLINICAL DATA:  74 year old male with flank pain. EXAM: RENAL / URINARY TRACT ULTRASOUND COMPLETE COMPARISON:  CT abdomen pelvis dated 07/07/2018. FINDINGS: Right Kidney: Renal measurements: 10.1 x 4.8 x 3.5 cm = volume: 89 mL. There is increased renal parenchyma echogenicity. There is a 1.4 cm upper pole cyst. There is an extrarenal pelvis with mild pelviectasis. No hydronephrosis or shadowing stone. Left Kidney: Renal measurements: 10.5 x 5.6 x 3.3 cm = volume: 101 mL. Increased renal parenchymal echogenicity. There is cortical irregularity and scarring. No hydronephrosis or shadowing stone. Bladder: Mildly thickened bladder wall may be related to underdistention or chronic bladder outlet obstruction. Correlation with urinalysis recommended to exclude cystitis. Right ureteral jet noted. Other: None. IMPRESSION: 1. Increased renal parenchymal echogenicity in keeping with medical renal disease. No hydronephrosis or shadowing stone. 2. Mildly thickened bladder wall. Correlation with urinalysis recommended to exclude cystitis. Electronically Signed   By: Elgie Collard M.D.   On: 06/25/2020 20:14   DG Foot 2 Views Left  Result Date: 06/25/2020 CLINICAL DATA:  Left foot pain fever ulcer at the heel EXAM: LEFT FOOT - 2 VIEW COMPARISON:  06/18/2020 FINDINGS: Limited by generalized osteopenia. No Maggie Senseney fracture or malalignment. No definitive bony destructive change. Extensive vascular calcification. Small heel ulcer. No radiopaque foreign body. IMPRESSION: 1. Limited by osteopenia.  No definite acute osseous abnormality. 2. Small heel ulcer. Electronically Signed   By: Jasmine Pang M.D.   On: 06/25/2020 19:42    Procedures Procedures (including critical care time)  Medications Ordered in ED Medications  acetaminophen (TYLENOL) tablet 1,000 mg (1,000 mg Oral Given 06/25/20 2205)  aspirin EC tablet 81 mg (81 mg Oral Given 06/25/20 2205)  traMADol (ULTRAM) tablet 50 mg (50 mg Oral Given 06/25/20 2205)  atorvastatin (LIPITOR) tablet 20 mg (20 mg Oral Given 06/25/20 2205)  metoprolol tartrate (LOPRESSOR) tablet 25 mg (25 mg Oral Given 06/25/20 2206)  hydrALAZINE (APRESOLINE) tablet 25 mg (has no administration in time range)  clopidogrel (PLAVIX) tablet 75 mg (75 mg Oral Not Given 06/25/20 2217)  gabapentin (NEURONTIN) capsule 100 mg (100 mg Oral Given 06/25/20 2205)  brimonidine (ALPHAGAN) 0.2 % ophthalmic solution 1 drop (1 drop Both Eyes Not Given 06/25/20 2217)  timolol (TIMOPTIC) 0.5 % ophthalmic solution 1 drop (1 drop Both Eyes Not Given 06/25/20 2217)  insulin aspart (novoLOG) injection 0-9 Units (has no administration in time range)  cefTRIAXone (ROCEPHIN) 1 g in sodium chloride 0.9 % 100 mL IVPB (has no administration in time range)  heparin injection 5,000 Units (5,000 Units Subcutaneous Given 06/25/20 2206)  cefTRIAXone (ROCEPHIN) 1 g in sodium chloride 0.9 % 100 mL IVPB (1 g Intravenous New Bag/Given 06/25/20 2015)  azithromycin (ZITHROMAX) 500 mg in sodium chloride 0.9 % 250 mL IVPB (500 mg Intravenous New Bag/Given 06/25/20  2212)    ED Course   ARHUM PEEPLES is a 74 y.o. male with PMHx listed that presents to the Emergency Department complaint of Altered Mental Status, Fatigue, and Fever    ED Course: Initial exam completed.   Chronically ill appearing but hemodynamically stable.  Potentially toxic and afebrile.  Physical exam significant for 74 year old male with slight confusion on examination today, not oriented to situation, no obvious focal neurologic deficits, abdomen  soft, nondistended, nontender, and a chronic left heel ulcer that has no evidence of purulent/exudative drainage.  Initial differential includes stroke/TIA, sepsis including urosepsis, pneumonia or other bacteremic sources, electrolyte abnormalities, dehydration, and viral illness.   Given these concerns, sepsis orders placed.  CT head rule acute intracranial abnormality although less suspicious given no focal neurologic deficits.  EKG sinus rhythm on arrival with heart rate 70 and normal intervals with no acute ST changes.  XR with increasing attenuation through the right mid and right upper lungs concerning for developing pneumonia.  CT head without acute intracranial abnormality.  CMP with mildly elevated creatinine 1.69 which does appear elevated from recent labs.  Lactic acid negative.  CBC with leukocytosis 22.5 and hemoglobin anemic 7.7; leukocytosis uptrending.  Will cover with ceftriaxone and azithromycin. Blood culture x2 pending.  Urine pending.  Discussed with hospitalist team who evaluated the patient and will admit to their service for further management.   Diagnostics Vital Signs: reviewed Labs: reviewed and significant findings discussed above Imaging: personally reviewed images interpreted by radiology EKG: reviewed Records: previous records reviewed and pertinent data discussed   Consults:  Hospitalist   Reevaluation/Disposition:  Upon reevaluation, patients symptoms stable.    All questions answered.   Patient and/or family was understanding and in agreement with today's assessment and plan.   Campbell Riches, MD Emergency Medicine, PGY-3   Note: Dragon medical dictation software was used in the creation of this note.   Final Clinical Impression(s) / ED Diagnoses Final diagnoses:  Fever, unspecified fever cause  Weakness  Other fatigue    Rx / DC Orders ED Discharge Orders    None       Nino Parsley, MD 06/25/20 2321    Marily Memos, MD 06/25/20 8119

## 2020-06-25 NOTE — H&P (Signed)
History and Physical    Jeremiah [name] TDV:[id number] DOB: 1946-07-16 DOA: 06/25/2020  PCP: System, Pcp Not In (Confirm with patient/family/NH records and if not entered, this has to be entered at [facility] point of entry) Patient coming from: Home  I have personally briefly reviewed patient's old medical records in [facility]  Chief Complaint: Fever, flank pain  HPI: Jeremiah [name] is a 74 y.o. male with medical history significant of PAD with recent left foot ischemia and left heel wound healing ulcer s/p Left SFA and above knee popliteal artery angioplasty with stent placement for long SFA chronic total occlusion on 06/20/2020, remote right AKA, chronic diastolic CHF, dyslipidemia, hypertension, DM type II, moderate aortic stenosis, sleep apnea but not compliant with mask, presented with generalized weakness, fever and left flank pain.  He described the pain as dull like, 6/10, radiating down to the left pelvic area. Patient has chronic urinary frequency/incontinence, he denied any dysuria. But patient did complain about new onset of left flank pain started yesterday.  Otherwise patient has been feeling fine after discharge home last Wednesday.  This morning patient woke up and feeling generalized weakness unable to get out of bed, and home care nurse came to his home to do regular check and found patient had a fever 101.8 and sent him to ED. He denied any cough, no short of breath, no chest pain, denied any dysuria, no diarrhea. ED Course: On route patient was found O2 saturation 89%, was placed on 2 L oxygen.  Chest x-ray showed increasing right markings, WBC 22.  UA showed WBC  >50. CT head negative.  Review of Systems: As per HPI otherwise 10 point review of systems negative.    Past Medical History:  Diagnosis Date  . Amputee   . Aortic atherosclerosis (HCC)   . Arthritis    "joints; shoulders, knees, hands, back" (05/21/2018)  . Atherosclerosis of coronary artery   . C. difficile  diarrhea 04/2018  . Diastolic CHF (HCC)   . Diverticulosis   . High cholesterol   . History of gout   . Hypertension   . IDA (iron deficiency anemia)    from referral Dr Darleene Cleaver  . Internal hemorrhoids   . Peripheral vascular disease (HCC)   . Pneumonia    "couple times" (05/21/2018)  . Sleep apnea    "has mask; won't use" (05/21/2018)  . Type II diabetes mellitus (HCC)     Past Surgical History:  Procedure Laterality Date  . ABDOMINAL AORTOGRAM W/LOWER EXTREMITY N/A 11/01/2018   Procedure: ABDOMINAL AORTOGRAM W/LOWER EXTREMITY;  Surgeon: Runell Gess, MD;  Location: [facility] INVASIVE CV LAB;  Service: Cardiovascular;  Laterality: N/A;  . ABDOMINAL AORTOGRAM W/LOWER EXTREMITY Left 06/20/2020   Procedure: ABDOMINAL AORTOGRAM W/LOWER EXTREMITY;  Surgeon: Cephus Shelling, MD;  Location: [facility] INVASIVE CV LAB;  Service: Cardiovascular;  Laterality: Left;  . AMPUTATION Right 11/09/2018   Procedure: RIGHT - AMPUTATION ABOVE KNEE;  Surgeon: Maeola Harman, MD;  Location: [facility];  Service: Vascular;  Laterality: Right;  . CATARACT EXTRACTION W/ INTRAOCULAR LENS  IMPLANT, BILATERAL Bilateral   . CHOLECYSTECTOMY  05/21/2018   ATTEMPTED LAPAROSCOPIC CHOLECYSTECTOMY, OPEN DRAINAGE OF GALLBLADDER WITH BIOPSY  . CHOLECYSTECTOMY N/A 05/21/2018   Procedure: ATTEMPTED LAPAROSCOPIC CHOLECYSTECTOMY, OPEN DRAINAGE OF GALLBLADDER WITH BIOPSY;  Surgeon: Griselda Miner, MD;  Location: [facility] OR;  Service: General;  Laterality: N/A;  . COLONOSCOPY W/ POLYPECTOMY    . COLONOSCOPY WITH PROPOFOL N/A 09/16/2018   Procedure:  COLONOSCOPY WITH PROPOFOL;  Surgeon: Charlott Rakes, MD;  Location: [facility] ENDOSCOPY;  Service: Endoscopy;  Laterality: N/A;  . 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 FLEXIBLE SIGMOIDOSCOPY N/A 04/20/2020   Procedure: FLEXIBLE SIGMOIDOSCOPY;  Surgeon: Meridee Score  Netty Starring., MD;  Location: [facility];  Service: Gastroenterology;  Laterality: N/A;  Fecal Disimpaction  . IR CATHETER TUBE CHANGE  04/14/2018  . IR CHOLANGIOGRAM EXISTING TUBE  03/17/2018  . IR PERC CHOLECYSTOSTOMY  01/31/2018  . IR RADIOLOGIST EVAL & MGMT  03/02/2018  . PERIPHERAL VASCULAR INTERVENTION Left 06/20/2020   Procedure: PERIPHERAL VASCULAR INTERVENTION;  Surgeon: Cephus Shelling, MD;  Location: [facility] INVASIVE CV LAB;  Service: Cardiovascular;  Laterality: Left;  4 SFA STENTS     reports that he quit smoking about 36 years ago. His smoking use included cigarettes. He quit after 24.00 years of use. He has never used smokeless tobacco. He reports previous alcohol use. He reports that he does not use drugs.  No Known Allergies  Family History  Problem Relation Age of Onset  . Diabetes Mother   . Hypertension Mother   . Heart disease Father   . Heart attack Father   . Diabetes Sister   . Hypertension Sister   . Diabetes Brother   . Heart disease Brother   . Hypertension Brother   . Heart attack Brother      Prior to Admission medications   Medication Sig Start Date End Date Taking? Authorizing Provider  acetaminophen (TYLENOL) 500 MG tablet Take 1,000 mg by mouth every 6 (six) hours as needed for moderate pain or headache.   Yes [provider]  aspirin 81 MG tablet Take 81 mg by mouth daily.   Yes [provider]  atorvastatin (LIPITOR) 20 MG tablet Take 1 tablet (20 mg total) by mouth daily. 12/06/18  Yes Angiulli, Mcarthur Rossetti, PA-C  brimonidine (ALPHAGAN) 0.2 % ophthalmic solution Place 1 drop into both eyes 2 (two) times daily. 12/06/18  Yes Angiulli, Mcarthur Rossetti, PA-C  clopidogrel (PLAVIX) 75 MG tablet Take 1 tablet (75 mg total) by mouth daily with breakfast. 06/22/20  Yes Rhetta Mura, MD  gabapentin (NEURONTIN) 100 MG capsule TAKE 1 CAPSULE (100 MG TOTAL) BY MOUTH 3 (THREE) TIMES DAILY. Patient taking differently: Take 100 mg by mouth 3 (three)  times daily as needed (pain).  09/13/19  Yes Marcello Fennel, MD  glimepiride (AMARYL) 1 MG tablet Take 1 tablet (1 mg total) by mouth daily with breakfast. 12/06/18  Yes Angiulli, Mcarthur Rossetti, PA-C  metoprolol succinate (TOPROL-XL) 50 MG 24 hr tablet Take 50 mg by mouth daily.  03/18/19  Yes [provider]  timolol (TIMOPTIC) 0.5 % ophthalmic solution Place 1 drop into both eyes 2 (two) times daily. 12/06/18  Yes Angiulli, Mcarthur Rossetti, PA-C  traMADol (ULTRAM) 50 MG tablet Take 50 mg by mouth every 8 (eight) hours as needed for moderate pain.    Yes [provider]  glucose blood test strip Accu-Chek Aviva Plus test strips  Take 1 strip 3 times a day by miscell. route for 90 days.    [provider]  glucose blood test strip Accu-Chek Aviva Plus test strips  USE AS DIRECTED THREE TIMES DAILY.    [provider]  hydrALAZINE (APRESOLINE) 100 MG tablet Take 1 tablet (100 mg total) by mouth every 8 (eight) hours. 06/21/20   Samtani,  Alta Corning, MD    Physical Exam: Vitals:   06/25/20 1644 06/25/20 1645 06/25/20 1748 06/25/20 1847  BP:  (!) 125/54  (!) 160/66  Pulse: 62 60 61 66  Resp: 16 15 15 17   Temp:      TempSrc:      SpO2: 96% 96% 98% 97%  Weight:      Height:        Constitutional: NAD, calm, comfortable Vitals:   06/25/20 1644 06/25/20 1645 06/25/20 1748 06/25/20 1847  BP:  (!) 125/54  (!) 160/66  Pulse: 62 60 61 66  Resp: 16 15 15 17   Temp:      TempSrc:      SpO2: 96% 96% 98% 97%  Weight:      Height:       Eyes: PERRL, lids and conjunctivae normal ENMT: Mucous membranes are moist. Posterior pharynx clear of any exudate or lesions.Normal dentition.  Neck: normal, supple, no masses, no thyromegaly Respiratory: clear to auscultation bilaterally, no wheezing, no crackles. Normal respiratory effort. No accessory muscle use.  Cardiovascular: Regular rate and rhythm, systolic murmur on heart base. No extremity edema. 2+ pedal pulses. No carotid  bruits.  Abdomen: Left CVA tenderness, no masses palpated. No hepatosplenomegaly. Bowel sounds positive.  Musculoskeletal: no clubbing / cyanosis. No joint deformity upper and lower extremities. Good ROM, no contractures. Normal muscle tone.  Skin: Left heel chronic wound with eschar covering, no discharge, no foul smell, no rash around. Neurologic: CN 2-12 grossly intact. Sensation intact, DTR normal. Strength 5/5 in all 4.  Psychiatric: Normal judgment and insight. Alert and oriented x 3. Normal mood.     Labs on Admission: I have personally reviewed following labs and imaging studies  CBC: Recent Labs  Lab 06/19/20 0303 06/20/20 0341 06/21/20 0250 06/25/20 1656  WBC 10.1 11.0* 11.7* 22.5*  NEUTROABS 5.4 5.5 7.1 18.7*  HGB 8.4* 8.2* 8.1* 7.7*  HCT 25.4* 25.5* 24.7* 24.4*  MCV 82.5 83.6 83.7 85.0  PLT 239 215 210 253   Basic Metabolic Panel: Recent Labs  Lab 06/19/20 0303 06/20/20 0341 06/21/20 0250 06/25/20 1656  NA 138 138 137 136  K 3.3* 4.3 4.0 3.8  CL 103 105 104 101  CO2 26 25 23 25   GLUCOSE 94 90 106* 134*  BUN 17 17 16 18   CREATININE 1.45* 1.45* 1.37* 1.69*  CALCIUM 8.6* 8.9 8.8* 8.5*  MG 1.8 1.9 1.8  --    GFR: Estimated Creatinine Clearance: 37.2 mL/min (A) (by C-G formula based on SCr of 1.69 mg/dL (H)). Liver Function Tests: Recent Labs  Lab 06/19/20 0303 06/20/20 0341 06/21/20 0250 06/25/20 1656  AST 13* 14* 12* 16  ALT 12 14 10 12   ALKPHOS 88 84 88 79  BILITOT 0.4 0.5 0.5 0.5  PROT 7.0 7.0 6.7 6.8  ALBUMIN 2.4* 2.4* 2.4* 2.1*   No results for input(s): LIPASE, AMYLASE in the last 168 hours. No results for input(s): AMMONIA in the last 168 hours. Coagulation Profile: Recent Labs  Lab 06/25/20 1656  INR 1.2   Cardiac Enzymes: No results for input(s): CKTOTAL, CKMB, CKMBINDEX, TROPONINI in the last 168 hours. BNP (last 3 results) No results for input(s): PROBNP in the last 8760 hours. HbA1C: No results for input(s): HGBA1C in the last  72 hours. CBG: Recent Labs  Lab 06/20/20 1223 06/20/20 2108 06/21/20 0616 06/21/20 1112 06/21/20 1304  GLUCAP 90 171* 81 128* 147*   Lipid Profile: No results for input(s): CHOL, HDL, LDLCALC, TRIG,  CHOLHDL, LDLDIRECT in the last 72 hours. Thyroid Function Tests: No results for input(s): TSH, T4TOTAL, FREET4, T3FREE, THYROIDAB in the last 72 hours. Anemia Panel: No results for input(s): VITAMINB12, FOLATE, FERRITIN, TIBC, IRON, RETICCTPCT in the last 72 hours. Urine analysis:    Component Value Date/Time   COLORURINE AMBER (A) 06/25/2020 1844   APPEARANCEUR CLOUDY (A) 06/25/2020 1844   LABSPEC 1.013 06/25/2020 1844   PHURINE 5.0 06/25/2020 1844   GLUCOSEU NEGATIVE 06/25/2020 1844   HGBUR MODERATE (A) 06/25/2020 1844   BILIRUBINUR NEGATIVE 06/25/2020 1844   KETONESUR NEGATIVE 06/25/2020 1844   PROTEINUR 100 (A) 06/25/2020 1844   UROBILINOGEN 0.2 01/09/2009 1532   NITRITE NEGATIVE 06/25/2020 1844   LEUKOCYTESUR LARGE (A) 06/25/2020 1844    Radiological Exams on Admission: DG Chest 2 View  Result Date: 06/25/2020 CLINICAL DATA:  Sepsis EXAM: CHEST - 2 VIEW COMPARISON:  June 18, 2020 FINDINGS: Again noted is a chronic right-sided pleural effusion. However, there is increased attenuation throughout the right mid and right upper lung zones. The heart size is stable. Aortic calcifications are again noted. There is no definite acute osseous abnormality. There is no pneumothorax. IMPRESSION: 1. Increasing attenuation throughout the right mid and right upper lung zones may represent developing pneumonia. 2. Chronic right-sided pleural effusion. Electronically Signed   By: Katherine Mantle M.D.   On: 06/25/2020 16:04   CT Head Wo Contrast  Result Date: 06/25/2020 CLINICAL DATA:  74 year old male recently discharged following treatment for vascular disease, gangrene. Altered mental status. EXAM: CT HEAD WITHOUT CONTRAST TECHNIQUE: Contiguous axial images were obtained from the base of  the skull through the vertex without intravenous contrast. COMPARISON:  Head CT 11/03/2018. FINDINGS: Brain: Stable cerebral volume since 2019. No midline shift, ventriculomegaly, mass effect, evidence of mass lesion, intracranial hemorrhage or evidence of cortically based acute infarction. Small dystrophic calcification in the left pons is unchanged. Mild to moderate for age patchy bilateral white matter hypodensity has not significantly changed. No cortical encephalomalacia identified. Vascular: Calcified atherosclerosis at the skull base. No suspicious intracranial vascular hyperdensity. Skull: Chronic appearing changes to the maxillary alveolus likely from remote dental disease. Absent maxillary dentition. No acute osseous abnormality identified. Sinuses/Orbits: Visualized paranasal sinuses and mastoids are stable and well pneumatized. Other: No acute orbit or scalp soft tissue finding. IMPRESSION: 1. No acute intracranial abnormality. 2. Chronic cerebral white matter disease not significantly changed since 2019. Electronically Signed   By: Odessa Fleming M.D.   On: 06/25/2020 16:19    EKG: Independently reviewed. NSR, no acute ST-T changes.  Assessment/Plan Active Problems:   Fever   Acute lower UTI  (please populate well all problems here in Problem List. (For example, if patient is on BP meds at home and you resume or decide to hold them, it is a problem that needs to be her. Same for CAD, COPD, HLD and so on)  Impending sepsis -Evidenced by elevated WBC count, fever one 1.8, BP appears to be low compared to last admission, suspect UTI/acute Pyelonephritis. -Suspect pt has underlying BPH, will check PVR and renal ultrasound (CT in May 2021 showed some gas in bladder, with unknown significance) -PNA less likely, given there is no cough, no SOB and O2 sat stable in ED, and chest Xray looks more chronic changes with chronic pleural effusion. -Heel ulcer does not appear infected. -PT for generalized  weakness  Left heel ulcer 2/2 PAD -On Plavix and Statin -Xray, wound check  AS -Recent echo showed there is a  progress degree of stenosis, no new signs of progression this time, plan for outpatient cardiology follow-up.  ED with left heel ulcer -Revascularization done last admission, continue Plavix -Wound care consult  Chronic diastolic CHF -Euvolemic, continue current CHF regimen  HTN -Change hydralazine to as needed for now.  [name] -Cre stable  IIDM -Change to sliding scale for now.   DVT prophylaxis: Heparin subcu Code Status: Full code Family Communication: Son at bedside Disposition Plan: Expect 1 to 2 days hospital stay to treat Pyelo/UTI Consults called: None Admission status: Tele admit   Emeline GeneralPing T Raylin Diguglielmo MD [facility] Pager [phone]  06/25/2020, 7:13 PM

## 2020-06-25 NOTE — ED Triage Notes (Signed)
Pt BIB [facility] from home with home health. Per EMS pt recently discharge 7/29 d/t gangrene and left vascular surgery.  C/O fever, increased fatigue, new onset urine insentience, mild confusion A&Ox3 disoriented to situation, and pain to L leg and flank. On arrival to ED denies pain. EMS noted pt spo2 89% placed on 2 L Oxygen. Currently 95% on room air

## 2020-06-25 NOTE — ED Notes (Signed)
RN for 5M16 to call back to obtain report for pt.

## 2020-06-26 ENCOUNTER — Encounter (HOSPITAL_COMMUNITY): Payer: Self-pay | Admitting: Internal Medicine

## 2020-06-26 LAB — BASIC METABOLIC PANEL
Anion gap: 7 (ref 5–15)
BUN: 22 mg/dL (ref 8–23)
CO2: 24 mmol/L (ref 22–32)
Calcium: 8.1 mg/dL — ABNORMAL LOW (ref 8.9–10.3)
Chloride: 103 mmol/L (ref 98–111)
Creatinine, Ser: 1.5 mg/dL — ABNORMAL HIGH (ref 0.61–1.24)
GFR calc Af Amer: 52 mL/min — ABNORMAL LOW (ref >60)
GFR calc non Af Amer: 45 mL/min — ABNORMAL LOW (ref >60)
Glucose, Bld: 133 mg/dL — ABNORMAL HIGH (ref 70–99)
Potassium: 4 mmol/L (ref 3.5–5.1)
Sodium: 134 mmol/L — ABNORMAL LOW (ref 135–145)

## 2020-06-26 LAB — CBC
HCT: 22 % — ABNORMAL LOW (ref 39.0–52.0)
Hemoglobin: 7 g/dL — ABNORMAL LOW (ref 13.0–17.0)
MCH: 26.7 pg (ref 26.0–34.0)
MCHC: 31.8 g/dL (ref 30.0–36.0)
MCV: 84 fL (ref 80.0–100.0)
Platelets: 215 10*3/uL (ref 150–400)
RBC: 2.62 MIL/uL — ABNORMAL LOW (ref 4.22–5.81)
RDW: 14.7 % (ref 11.5–15.5)
WBC: 14.8 10*3/uL — ABNORMAL HIGH (ref 4.0–10.5)
nRBC: 0 % (ref 0.0–0.2)

## 2020-06-26 LAB — GLUCOSE, CAPILLARY
Glucose-Capillary: 72 mg/dL (ref 70–99)
Glucose-Capillary: 72 mg/dL (ref 70–99)
Glucose-Capillary: 135 mg/dL — ABNORMAL HIGH (ref 70–99)
Glucose-Capillary: 108 mg/dL — ABNORMAL HIGH (ref 70–99)
Glucose-Capillary: 110 mg/dL — ABNORMAL HIGH (ref 70–99)

## 2020-06-26 LAB — IRON AND TIBC
Iron: 16 ug/dL — ABNORMAL LOW (ref 45–182)
Saturation Ratios: 10 % — ABNORMAL LOW (ref 17.9–39.5)
TIBC: 168 ug/dL — ABNORMAL LOW (ref 250–450)
UIBC: 152 ug/dL

## 2020-06-26 LAB — RETICULOCYTES
Immature Retic Fract: 9.6 % (ref 2.3–15.9)
RBC.: 2.64 MIL/uL — ABNORMAL LOW (ref 4.22–5.81)
Retic Count, Absolute: 32.5 10*3/uL (ref 19.0–186.0)
Retic Ct Pct: 1.2 % (ref 0.4–3.1)

## 2020-06-26 LAB — FERRITIN: Ferritin: 402 ng/mL — ABNORMAL HIGH (ref 24–336)

## 2020-06-26 LAB — VITAMIN B12: Vitamin B-12: 129 pg/mL — ABNORMAL LOW (ref 180–914)

## 2020-06-26 LAB — FOLATE: Folate: 7.2 ng/mL (ref >5.9)

## 2020-06-26 MED ORDER — FERROUS SULFATE 325 (65 FE) MG PO TABS
325.0000 mg | ORAL_TABLET | Freq: Every day | ORAL | Status: DC
  Administered 2020-06-26 – 2020-06-28 (×3): 325 mg via ORAL
  Filled 2020-06-26 (×3): qty 1

## 2020-06-26 MED ORDER — GABAPENTIN 100 MG PO CAPS
200.0000 mg | ORAL_CAPSULE | Freq: Once | ORAL | Status: AC
  Administered 2020-06-27: 200 mg via ORAL
  Filled 2020-06-26: qty 2

## 2020-06-26 MED ORDER — CYANOCOBALAMIN 1000 MCG/ML IJ SOLN
1000.0000 ug | Freq: Every day | INTRAMUSCULAR | Status: DC
  Administered 2020-06-26 – 2020-06-27 (×2): 1000 ug via INTRAMUSCULAR
  Filled 2020-06-26 (×3): qty 1

## 2020-06-26 MED ORDER — AZITHROMYCIN 500 MG PO TABS
500.0000 mg | ORAL_TABLET | Freq: Every day | ORAL | Status: DC
  Administered 2020-06-26 – 2020-06-27 (×2): 500 mg via ORAL
  Filled 2020-06-26 (×3): qty 1

## 2020-06-26 NOTE — Consult Note (Signed)
WOC Nurse Consult Note: Reason for Consult: Unstageable Pressure Injury to left heel  Patient is s/p left SFA and above knee popliteal artery angioplasty with stent placement on 06/20/20 with Dr. Chestine Spore. Discharged from that admission 5 days ago (06/21/20). Wound type: Pressure, vascular insufficiency now with ntervention Pressure Injury POA: Yes Measurement: 4cm x 5cm with depth obscured by the presence of black eschar, stable. Wound bed:See above Drainage (amount, consistency, odor) None Periwound:dry Dressing procedure/placement/frequency: Nursing is provided with guidance for the topical care of this chronic, non healng wound using a twice daily soap and water cleanse, rinse and pat dry. The eschar is to be painted liberally with a betadine swabstick and allowed to air-dry. When dry, a dry dressing is to be applied, secured and the foot placed in a pressure redistribution heel boot. A sacral silicone foam is to be placed to prevent pressure injury.  Recommend notifying Vascular of patient's readmission. If you agree, please consult. Patient is to see Dr. [name] in 2 weeks for assessment of left heel.  If not, please reinforce need to [facility] appointment.  WOC nursing team will not follow, but will remain available to this patient, the nursing and medical teams.  Please re-consult if needed. Thanks, Ladona Mow, MSN, RN, GNP, [place]  Pager# [phone]

## 2020-06-26 NOTE — Progress Notes (Signed)
PROGRESS NOTE    Jeremiah [name]  ALP:[id number] DOB: Mar 18, 1946 DOA: 06/25/2020 PCP: System, Pcp Not In   Brief Narrative: 74 year old with past medical history significant for PAD with recent left foot ischemia on left heel wound ulcer status post left SFA and above-knee popliteal artery angioplasty with a stent placement for SFA chronic total occlusion on 7/28 2021, remote right AKA, chronic diastolic heart failure, hypertension, diabetes type 2,  moderate aortic stenosis, sleep apnea noncompliant with mask who presents with generalized weakness, fever and left flank pain.  He described pain as dull, 6 out of 10 radiating down to the left pelvis area.  He report chronic urinary frequency and incontinence.  He reports pain with urination.  ED patient was found to be hypoxic with oxygen saturation 89 he was placed on 2 L.  Chest x-ray showed increased right marking, white blood cell 22.  UA showed more than 50 white blood cell.  CT head was negative.  Patient was admitted for sepsis secondary to UTI/pyelonephritis, and or pneumonia.   Assessment & Plan:   Active Problems:   Fever   Acute lower UTI   1-Sepsis secondary to UTI and or PNA;  -Chest x ray ; increasing attenuation throughout the right mid and right upper lung sounds may represent developing pneumonia.  Chronic right side pleural effusion. -UA with more 50  white blood cells. -Follow Blood culture.  -WBC has decrease from 22 to 14. Will continue with current antibiotics: ceftriaxone and Azithro.    Iron deficiency and [name] anemia: -We will start oral iron supplements.  -Start B12 injection, for 5 days.  Subsequently  transition to oral supplement   Acute hypoxic Respiratory failure; suspect related to PNA.  -Continue with ceftriaxone and Zithromax.  -Continue with oxygen supplementation.  -Incentive spirometry.   Critical Limb ischemia left lower extremity and heel ulcer wound;  Present on admission. Pressure  wound, vascular insufficiency/  -S/P SFA and above knee popliteal artery angioplasty with stent placement for long SFA chronic occlusion.  -Wound care consulted.  -Send message to DR Chestine Spore of patient admission.  -Continue with Plavix and statins  AS;  -Needs follow-up with cardiology as an outpatient  Chronic diastolic heart failure: -Not on Lasix at home.  Hypertension: -Continue with metoprolol  AKI on [name]: -Creatinine baseline 1.3.  -Creatinine peak this admission 1.6 -Improving, cr down to 1.3.  -Suspect worsening function secondary to infection.  -Monitor urine out put. Check bladder scan.  -Renal US; Increased renal parenchymal echogenicity in keeping with medical renal disease. No hydronephrosis or shadowing stone. Mildly thickened bladder wall. Correlation with urinalysis recommended to exclude cystitis.  Diabetes type 2 -SSI.   Mild hyponatremia: Monitor                 Estimated body mass index is 23.63 kg/m as calculated from the following:   Height as of this encounter: 5\' 9"  (1.753 m).   Weight as of this encounter: 72.6 kg.   DVT prophylaxis: Heparin Code Status: Full code Family Communication: Discussed with patient Disposition Plan:  Status is: Inpatient  Remains inpatient appropriate because:Persistent severe electrolyte disturbances   Dispo: The patient is from: Home              Anticipated d/c is to: Home              Anticipated d/c date is: 2 days  Patient currently is not medically stable to d/c.  Treating for infection, AKI.        Consultants:   Dr. Chestine Sporelark informed of admission  Procedures:   Renal ultrasound: Negative for hydronephrosis findings consistent with cystitis  Antimicrobials:    Subjective: Patient  is alert and conversant.  He still having left lower quadrant and flank pain but improved.  Objective: Vitals:   06/25/20 2003 06/25/20 2117 06/26/20 0117 06/26/20 0455  BP:  132/65  129/63 135/70  Pulse:  62 65 (!) 59  Resp:  16 17 17   Temp: 98.6 F (37 C) 98.8 F (37.1 C) 98 F (36.7 C) 98.4 F (36.9 C)  TempSrc: Oral Oral  Oral  SpO2:  96% 97% 97%  Weight:      Height:        Intake/Output Summary (Last 24 hours) at 06/26/2020 0722 Last data filed at 06/26/2020 0455 Gross per 24 hour  Intake 480 ml  Output 0 ml  Net 480 ml   Filed Weights   06/25/20 1528  Weight: 72.6 kg    Examination:  General exam: Appears calm and comfortable  Respiratory system: Clear to auscultation. Respiratory effort normal. Cardiovascular system: S1 & S2 heard, RRR. No JVD, murmurs, rubs, gallops or clicks. No pedal edema. Gastrointestinal system: Abdomen is nondistended, soft and nontender. No organomegaly or masses felt. Normal bowel sounds heard. Central nervous system: Alert and oriented. No focal neurological deficits. Extremities: Symmetric 5 x 5 power.    Data Reviewed: I have personally reviewed following labs and imaging studies  CBC: Recent Labs  Lab 06/20/20 0341 06/21/20 0250 06/25/20 1656 06/26/20 0300  WBC 11.0* 11.7* 22.5* 14.8*  NEUTROABS 5.5 7.1 18.7*  --   HGB 8.2* 8.1* 7.7* 7.0*  HCT 25.5* 24.7* 24.4* 22.0*  MCV 83.6 83.7 85.0 84.0  PLT 215 210 253 215   Basic Metabolic Panel: Recent Labs  Lab 06/20/20 0341 06/21/20 0250 06/25/20 1656 06/26/20 0300  NA 138 137 136 134*  K 4.3 4.0 3.8 4.0  CL 105 104 101 103  CO2 25 23 25 24   GLUCOSE 90 106* 134* 133*  BUN 17 16 18 22   CREATININE 1.45* 1.37* 1.69* 1.50*  CALCIUM 8.9 8.8* 8.5* 8.1*  MG 1.9 1.8  --   --    GFR: Estimated Creatinine Clearance: 41.9 mL/min (A) (by C-G formula based on SCr of 1.5 mg/dL (H)). Liver Function Tests: Recent Labs  Lab 06/20/20 0341 06/21/20 0250 06/25/20 1656  AST 14* 12* 16  ALT 14 10 12   ALKPHOS 84 88 79  BILITOT 0.5 0.5 0.5  PROT 7.0 6.7 6.8  ALBUMIN 2.4* 2.4* 2.1*   No results for input(s): LIPASE, AMYLASE in the last 168 hours. No results  for input(s): AMMONIA in the last 168 hours. Coagulation Profile: Recent Labs  Lab 06/25/20 1656  INR 1.2   Cardiac Enzymes: No results for input(s): CKTOTAL, CKMB, CKMBINDEX, TROPONINI in the last 168 hours. BNP (last 3 results) No results for input(s): PROBNP in the last 8760 hours. HbA1C: No results for input(s): HGBA1C in the last 72 hours. CBG: Recent Labs  Lab 06/21/20 0616 06/21/20 1112 06/21/20 1304 06/25/20 2118 06/26/20 0648  GLUCAP 81 128* 147* 169* 72   Lipid Profile: No results for input(s): CHOL, HDL, LDLCALC, TRIG, CHOLHDL, LDLDIRECT in the last 72 hours. Thyroid Function Tests: No results for input(s): TSH, T4TOTAL, FREET4, T3FREE, THYROIDAB in the last 72 hours. Anemia Panel: No results for input(s): VITAMINB12,  FOLATE, FERRITIN, TIBC, IRON, RETICCTPCT in the last 72 hours. Sepsis Labs: Recent Labs  Lab 06/20/20 0341 06/21/20 0250 06/25/20 1656 06/25/20 2222  PROCALCITON <0.10 <0.10  --   --   LATICACIDVEN  --   --  0.8 1.2    Recent Results (from the past 240 hour(s))  Blood culture (routine x 2)     Status: None   Collection Time: 06/18/20  1:00 PM   Specimen: BLOOD LEFT HAND  Result Value Ref Range Status   Specimen Description BLOOD LEFT HAND  Final   Special Requests   Final    BOTTLES DRAWN AEROBIC ONLY Blood Culture results may not be optimal due to an inadequate volume of blood received in culture bottles   Culture   Final    NO GROWTH 5 DAYS Performed at [facility], [address], [place], [place] [address]    Report Status 06/23/2020 FINAL  Final  Blood culture (routine x 2)     Status: None   Collection Time: 06/18/20  1:07 PM   Specimen: BLOOD  Result Value Ref Range Status   Specimen Description BLOOD SITE NOT SPECIFIED  Final   Special Requests   Final    BOTTLES DRAWN AEROBIC ONLY Blood Culture adequate volume   Culture   Final    NO GROWTH 5 DAYS Performed at [facility], [address] [place],  [place] [address]    Report Status 06/23/2020 FINAL  Final  SARS Coronavirus 2 by RT PCR (hospital order, performed in [facility] hospital lab) Nasopharyngeal Nasopharyngeal Swab     Status: None   Collection Time: 06/18/20  4:00 PM   Specimen: Nasopharyngeal Swab  Result Value Ref Range Status   SARS Coronavirus 2 NEGATIVE NEGATIVE Final    Comment: (NOTE) SARS-CoV-2 target nucleic acids are NOT DETECTED.  The SARS-CoV-2 RNA is generally detectable in upper and lower respiratory specimens during the acute phase of infection. The lowest concentration of SARS-CoV-2 viral copies this assay can detect is 250 copies / mL. A negative result does not preclude SARS-CoV-2 infection and should not be used as the sole basis for treatment or other patient management decisions.  A negative result may occur with improper specimen collection / handling, submission of specimen other than nasopharyngeal swab, presence of viral mutation(s) within the areas targeted by this assay, and inadequate number of viral copies (<250 copies / mL). A negative result must be combined with clinical observations, patient history, and epidemiological information.  Fact Sheet for Patients:   BoilerBrush.com.cy  Fact Sheet for Healthcare Providers: https://pope.com/  This test is not yet approved or  cleared by the [place] FDA and has been authorized for detection and/or diagnosis of SARS-CoV-2 by FDA under an Emergency Use Authorization (EUA).  This EUA will remain in effect (meaning this test can be used) for the duration of the COVID-19 declaration under Section 564(b)(1) of the Act, 21 U.S.C. section 360bbb-3(b)(1), unless the authorization is terminated or revoked sooner.  Performed at [facility], [address] [place], [place] [address]   MRSA PCR Screening     Status: None   Collection Time: 06/18/20  4:00 PM   Specimen: Nasopharyngeal Wash   Result Value Ref Range Status   MRSA by PCR NEGATIVE NEGATIVE Final    Comment:        The GeneXpert MRSA Assay (FDA approved for NASAL specimens only), is one component of a comprehensive MRSA colonization  surveillance program. It is not intended to diagnose MRSA infection nor to guide or monitor treatment for MRSA infections. Performed at [facility], [address] [place], [place] [address]   Surgical pcr screen     Status: None   Collection Time: 06/18/20  9:44 PM   Specimen: Nasal Mucosa; Nasal Swab  Result Value Ref Range Status   MRSA, PCR NEGATIVE NEGATIVE Final   Staphylococcus aureus NEGATIVE NEGATIVE Final    Comment: (NOTE) The Xpert SA Assay (FDA approved for NASAL specimens in patients 59 years of age and older), is one component of a comprehensive surveillance program. It is not intended to diagnose infection nor to guide or monitor treatment. Performed at [facility], [address] [place], [place] [address]   SARS Coronavirus 2 by RT PCR (hospital order, performed in [facility] hospital lab) Nasopharyngeal Nasopharyngeal Swab     Status: None   Collection Time: 06/25/20  8:03 PM   Specimen: Nasopharyngeal Swab  Result Value Ref Range Status   SARS Coronavirus 2 NEGATIVE NEGATIVE Final    Comment: (NOTE) SARS-CoV-2 target nucleic acids are NOT DETECTED.  The SARS-CoV-2 RNA is generally detectable in upper and lower respiratory specimens during the acute phase of infection. The lowest concentration of SARS-CoV-2 viral copies this assay can detect is 250 copies / mL. A negative result does not preclude SARS-CoV-2 infection and should not be used as the sole basis for treatment or other patient management decisions.  A negative result may occur with improper specimen collection / handling, submission of specimen other than nasopharyngeal swab, presence of viral mutation(s) within the areas targeted by this assay, and inadequate number of  viral copies (<250 copies / mL). A negative result must be combined with clinical observations, patient history, and epidemiological information.  Fact Sheet for Patients:   BoilerBrush.com.cy  Fact Sheet for Healthcare Providers: https://pope.com/  This test is not yet approved or  cleared by the [place] FDA and has been authorized for detection and/or diagnosis of SARS-CoV-2 by FDA under an Emergency Use Authorization (EUA).  This EUA will remain in effect (meaning this test can be used) for the duration of the COVID-19 declaration under Section 564(b)(1) of the Act, 21 U.S.C. section 360bbb-3(b)(1), unless the authorization is terminated or revoked sooner.  Performed at [facility], [address] [place], [place] [address]          Radiology Studies: DG Chest 2 View  Result Date: 06/25/2020 CLINICAL DATA:  Sepsis EXAM: CHEST - 2 VIEW COMPARISON:  June 18, 2020 FINDINGS: Again noted is a chronic right-sided pleural effusion. However, there is increased attenuation throughout the right mid and right upper lung zones. The heart size is stable. Aortic calcifications are again noted. There is no definite acute osseous abnormality. There is no pneumothorax. IMPRESSION: 1. Increasing attenuation throughout the right mid and right upper lung zones may represent developing pneumonia. 2. Chronic right-sided pleural effusion. Electronically Signed   By: Katherine Mantle M.D.   On: 06/25/2020 16:04   CT Head Wo Contrast  Result Date: 06/25/2020 CLINICAL DATA:  74 year old male recently discharged following treatment for vascular disease, gangrene. Altered mental status. EXAM: CT HEAD WITHOUT CONTRAST TECHNIQUE: Contiguous axial images were obtained from the base of the skull through the vertex without intravenous contrast. COMPARISON:  Head CT 11/03/2018. FINDINGS: Brain: Stable cerebral volume since 2019. No midline shift,  ventriculomegaly, mass effect, evidence of mass lesion, intracranial hemorrhage or  evidence of cortically based acute infarction. Small dystrophic calcification in the left pons is unchanged. Mild to moderate for age patchy bilateral white matter hypodensity has not significantly changed. No cortical encephalomalacia identified. Vascular: Calcified atherosclerosis at the skull base. No suspicious intracranial vascular hyperdensity. Skull: Chronic appearing changes to the maxillary alveolus likely from remote dental disease. Absent maxillary dentition. No acute osseous abnormality identified. Sinuses/Orbits: Visualized paranasal sinuses and mastoids are stable and well pneumatized. Other: No acute orbit or scalp soft tissue finding. IMPRESSION: 1. No acute intracranial abnormality. 2. Chronic cerebral white matter disease not significantly changed since 2019. Electronically Signed   By: Odessa Fleming M.D.   On: 06/25/2020 16:19   US RENAL  Result Date: 06/25/2020 CLINICAL DATA:  74 year old male with flank pain. EXAM: RENAL / URINARY TRACT ULTRASOUND COMPLETE COMPARISON:  CT abdomen pelvis dated 07/07/2018. FINDINGS: Right Kidney: Renal measurements: 10.1 x 4.8 x 3.5 cm = volume: 89 mL. There is increased renal parenchyma echogenicity. There is a 1.4 cm upper pole cyst. There is an extrarenal pelvis with mild pelviectasis. No hydronephrosis or shadowing stone. Left Kidney: Renal measurements: 10.5 x 5.6 x 3.3 cm = volume: 101 mL. Increased renal parenchymal echogenicity. There is cortical irregularity and scarring. No hydronephrosis or shadowing stone. Bladder: Mildly thickened bladder wall may be related to underdistention or chronic bladder outlet obstruction. Correlation with urinalysis recommended to exclude cystitis. Right ureteral jet noted. Other: None. IMPRESSION: 1. Increased renal parenchymal echogenicity in keeping with medical renal disease. No hydronephrosis or shadowing stone. 2. Mildly thickened bladder  wall. Correlation with urinalysis recommended to exclude cystitis. Electronically Signed   By: Elgie Collard M.D.   On: 06/25/2020 20:14   DG Foot 2 Views Left  Result Date: 06/25/2020 CLINICAL DATA:  Left foot pain fever ulcer at the heel EXAM: LEFT FOOT - 2 VIEW COMPARISON:  06/18/2020 FINDINGS: Limited by generalized osteopenia. No gross fracture or malalignment. No definitive bony destructive change. Extensive vascular calcification. Small heel ulcer. No radiopaque foreign body. IMPRESSION: 1. Limited by osteopenia. No definite acute osseous abnormality. 2. Small heel ulcer. Electronically Signed   By: Jasmine Pang M.D.   On: 06/25/2020 19:42        Scheduled Meds: . aspirin EC  81 mg Oral Daily  . atorvastatin  20 mg Oral Daily  . brimonidine  1 drop Both Eyes BID  . clopidogrel  75 mg Oral Q breakfast  . heparin injection (subcutaneous)  5,000 Units Subcutaneous Q12H  . insulin aspart  0-9 Units Subcutaneous TID WC  . metoprolol tartrate  25 mg Oral BID  . timolol  1 drop Both Eyes BID   Continuous Infusions: . cefTRIAXone (ROCEPHIN)  IV       LOS: 1 day    Time spent: 35 minutes.     Alba Cory, MD [facility]   If 7PM-7AM, please contact night-coverage [email or web address]  06/26/2020, 7:22 AM

## 2020-06-26 NOTE — Evaluation (Signed)
Physical Therapy Evaluation Patient Details Name: Jeremiah [name] MRN: [id number] DOB: 10/20/1946 Today's Date: 06/26/2020   History of Present Illness  Pt is 74 yo male with PMH of significant PAD with recent L ischemic foot with ulcer s/p L SFA and above knee popliteal artery angioplasty with stent on 06/20/20; remote R AKA, CHF, dyslipidemia, HTN, DM2, aortic stenosis, and sleep apnea.  He presented with fever and L flank pain.  Pt admitted with acute lower UTI and impending sepsis.  Clinical Impression  Pt admitted with above diagnosis. Pt with limitations in mobility due to remote R BKA and current L heel wound.  He was able to participate in bed mobility and scooting at EOB with min guard level and use of bed rails.  Pt was non ambulatory at baseline and reports adequate assistance from family, personal care aides, and was working with [facility] therapy. Will benefit from acute PT to continue to advance mobility and maintain strength while hospitalized. Pt currently with functional limitations due to the deficits listed below (see PT Problem List). Pt will benefit from skilled PT to increase their independence and safety with mobility to allow discharge to the venue listed below.       Follow Up Recommendations Home health PT;Supervision for mobility/OOB    Equipment Recommendations  None recommended by PT    Recommendations for Other Services       Precautions / Restrictions Precautions Precautions: Fall Restrictions Weight Bearing Restrictions: No      Mobility  Bed Mobility Overal bed mobility: Needs Assistance Bed Mobility: Supine to Sit;Sit to Supine;Rolling Rolling: Min guard   Supine to sit: Min guard;HOB elevated Sit to supine: Min guard;HOB elevated   General bed mobility comments: Use of bed rails  Transfers Overall transfer level: Needs assistance   Transfers: Lateral/Scoot Transfers           General transfer comment: Pt laterally scooting toward HOB with min  guard for safety; did not have drop arm recliner in room and request to rest in bed at this time; also some limitations in scooting due to IV placement R wrist painful  Ambulation/Gait                Stairs            Wheelchair Mobility    Modified Rankin (Stroke Patients Only)       Balance Overall balance assessment: Needs assistance Sitting-balance support: No upper extremity supported Sitting balance-Leahy Scale: Good Sitting balance - Comments: Sat at EOB with weight shifting and pertubations during washing; sat EOB for 10 minutes       Standing balance comment: unable due to baseline, L heel wound                             Pertinent Vitals/Pain Pain Assessment: 0-10 Pain Score: 5  Pain Location: L flank with movement Pain Intervention(s): Limited activity within patient's tolerance;Monitored during session    Home Living Family/patient expects to be discharged to:: Private residence Living Arrangements: Spouse/significant other Available Help at Discharge: Family;Available 24 hours/day Type of Home: House Home Access: Ramped entrance     Home Layout: One level Home Equipment: Wheelchair - manual;Bedside commode;Shower seat;Walker - 2 wheels;Hospital bed Additional Comments: has sliding board; wife is bil amputee but has family that assist as needed    Prior Function Level of Independence: Needs assistance   Gait / Transfers Assistance Needed: Did sliding board  with assist to set up; uses manual w/c  ADL's / Homemaking Assistance Needed: Reports had assistance with sponge baths but could do toileting and dressing  Comments: Pt had personal care aides who come 1-2 times a week; family assist with ramp and car transfer     Hand Dominance        Extremity/Trunk Assessment   Upper Extremity Assessment Upper Extremity Assessment: RUE deficits/detail;LUE deficits/detail RUE Deficits / Details: ROM WFL ; MMT 4+/5 LUE Deficits /  Details: ROM WFL ; MMT 4+/5    Lower Extremity Assessment Lower Extremity Assessment: RLE deficits/detail;LLE deficits/detail RLE Deficits / Details: R AKA, demonstrates full ROM, reports has prosthetic but has not wore or stood for a long time LLE Deficits / Details: ROM: L knee lacks 10 ext, ankle with near neutral dorsiflexion but some limitations;  MMT: 4/5 throughtout; Pt with ulcer on L heel    Cervical / Trunk Assessment Cervical / Trunk Assessment: Normal  Communication   Communication: No difficulties  Cognition Arousal/Alertness: Awake/alert Behavior During Therapy: WFL for tasks assessed/performed Overall Cognitive Status: Within Functional Limits for tasks assessed                                 General Comments: Required very specific questions to obtain detailed PLOF (assist level at home , etc)      General Comments General comments (skin integrity, edema, etc.): VSS; pt reports he was managing at home ok with assist from wife, sons, HH aides, and HHtherapy -feels close to his level of mobility prior to admission    Exercises     Assessment/Plan    PT Assessment Patient needs continued PT services  PT Problem List Decreased strength;Decreased activity tolerance;Decreased balance;Decreased mobility;Decreased skin integrity;Impaired sensation;Decreased range of motion       PT Treatment Interventions DME instruction;Functional mobility training;Therapeutic activities;Therapeutic exercise;Balance training;Patient/family education;Wheelchair mobility training    PT Goals (Current goals can be found in the Care Plan section)  Acute Rehab PT Goals Patient Stated Goal: return home PT Goal Formulation: With patient Time For Goal Achievement: 07/10/20 Potential to Achieve Goals: Good    Frequency Min 3X/week   Barriers to discharge        Co-evaluation               AM-PAC PT "6 Clicks" Mobility  Outcome Measure Help needed turning from  your back to your side while in a flat bed without using bedrails?: A Little Help needed moving from lying on your back to sitting on the side of a flat bed without using bedrails?: A Little (required rails) Help needed moving to and from a bed to a chair (including a wheelchair)?: A Little (scooted at EOB) Help needed standing up from a chair using your arms (e.g., wheelchair or bedside chair)?: Total Help needed to walk in hospital room?: Total Help needed climbing 3-5 steps with a railing? : Total 6 Click Score: 12    End of Session   Activity Tolerance: Patient tolerated treatment well Patient left: in bed;with call bell/phone within reach;with bed alarm set (c/o R buttock burining - positioned in L sidelying with pillows; also L heel floating) Nurse Communication: Mobility status PT Visit Diagnosis: Muscle weakness (generalized) (M62.81)    Time: 0347-4259 PT Time Calculation (min) (ACUTE ONLY): 30 min   Charges:   PT Evaluation $PT Eval Low Complexity: 1 Low PT Treatments $Therapeutic Activity: 8-22 mins  Anise Salvo, PT Acute Rehab Services Pager [phone] [facility] [phone]    Rayetta Humphrey 06/26/2020, 11:27 AM

## 2020-06-27 DIAGNOSIS — R531 Weakness: Secondary | ICD-10-CM

## 2020-06-27 LAB — BASIC METABOLIC PANEL
Anion gap: 4 — ABNORMAL LOW (ref 5–15)
BUN: 16 mg/dL (ref 8–23)
CO2: 26 mmol/L (ref 22–32)
Calcium: 8.3 mg/dL — ABNORMAL LOW (ref 8.9–10.3)
Chloride: 104 mmol/L (ref 98–111)
Creatinine, Ser: 1.33 mg/dL — ABNORMAL HIGH (ref 0.61–1.24)
GFR calc Af Amer: 60 mL/min — ABNORMAL LOW (ref >60)
GFR calc non Af Amer: 52 mL/min — ABNORMAL LOW (ref >60)
Glucose, Bld: 188 mg/dL — ABNORMAL HIGH (ref 70–99)
Potassium: 3.7 mmol/L (ref 3.5–5.1)
Sodium: 134 mmol/L — ABNORMAL LOW (ref 135–145)

## 2020-06-27 LAB — GLUCOSE, CAPILLARY
Glucose-Capillary: 127 mg/dL — ABNORMAL HIGH (ref 70–99)
Glucose-Capillary: 129 mg/dL — ABNORMAL HIGH (ref 70–99)
Glucose-Capillary: 145 mg/dL — ABNORMAL HIGH (ref 70–99)
Glucose-Capillary: 87 mg/dL (ref 70–99)

## 2020-06-27 LAB — CBC
HCT: 21.9 % — ABNORMAL LOW (ref 39.0–52.0)
Hemoglobin: 7 g/dL — ABNORMAL LOW (ref 13.0–17.0)
MCH: 27.1 pg (ref 26.0–34.0)
MCHC: 32 g/dL (ref 30.0–36.0)
MCV: 84.9 fL (ref 80.0–100.0)
Platelets: 259 10*3/uL (ref 150–400)
RBC: 2.58 MIL/uL — ABNORMAL LOW (ref 4.22–5.81)
RDW: 14.6 % (ref 11.5–15.5)
WBC: 10.5 10*3/uL (ref 4.0–10.5)
nRBC: 0 % (ref 0.0–0.2)

## 2020-06-27 LAB — URINE CULTURE

## 2020-06-27 LAB — PREPARE RBC (CROSSMATCH)

## 2020-06-27 MED ORDER — GABAPENTIN 300 MG PO CAPS
300.0000 mg | ORAL_CAPSULE | Freq: Two times a day (BID) | ORAL | Status: DC
  Administered 2020-06-27 (×2): 300 mg via ORAL
  Filled 2020-06-27 (×3): qty 1

## 2020-06-27 MED ORDER — SODIUM CHLORIDE 0.9% IV SOLUTION: Freq: Once | INTRAVENOUS | Status: AC

## 2020-06-27 NOTE — Progress Notes (Addendum)
PROGRESS NOTE    Jeremiah [name]  UJW:[id number] DOB: [birth year] DOA: 06/25/2020 PCP: System, Pcp Not In   Brief Narrative: 74 year old with past medical history significant for PAD with recent left foot ischemia on left heel wound ulcer status post left SFA and above-knee popliteal artery angioplasty with a stent placement for SFA chronic total occlusion on 7/28 2021, remote right AKA, chronic diastolic heart failure, hypertension, diabetes type 2,  moderate aortic stenosis, sleep apnea noncompliant with mask who presents with generalized weakness, fever and left flank pain.  He described pain as dull, 6 out of 10 radiating down to the left pelvis area.  He report chronic urinary frequency and incontinence.  He reports pain with urination.  ED patient was found to be hypoxic with oxygen saturation 89 he was placed on 2 L.  Chest x-ray showed increased right marking, white blood cell 22.  UA showed more than 50 white blood cell.  CT head was negative.  Patient was admitted for sepsis secondary to UTI/pyelonephritis, and or pneumonia.   Assessment & Plan:   UTI and or PNA;  Sepsis initially suspected but ruled out -Chest x ray ; increasing attenuation throughout the right mid and right upper lung sounds may represent developing pneumonia.  Chronic right side pleural effusion. -UA with more 50  white blood cells. -Clinically improving, continue ceftriaxone and azithromycin -Urine culture with multiple species, blood cultures no growth for 2 days -Ambulate, PT OT  Iron deficiency and [name] anemia: -Will transfuse 1 unit of PRBC today -Continue B12 injection, for 5 days.  -Give IV iron x1 tomorrow -Iron deficiency anemia work-up as outpatient  Acute hypoxic Respiratory failure; suspect related to PNA.  -Continue with ceftriaxone and Zithromax.  -Continue with oxygen supplementation.  -Incentive spirometry.   Critical Limb ischemia left lower extremity and heel ulcer wound;   Present on admission. Pressure wound, vascular insufficiency/  -S/P SFA and above knee popliteal artery angioplasty with stent placement for long SFA chronic occlusion on 7/28 -Wound care consulted.  -Continue aspirin, Plavix, statin -Follow-up with Dr. Chestine Spore vascular  History of PAD -Right AKA -With severe phantom limb pain, increase gabapentin to 200 mg  Aortic stenosis -Needs follow-up with cardiology as an outpatient  Chronic diastolic heart failure: -Not on Lasix at home.  Hypertension: -Continue with metoprolol  AKI on [name]: -Creatinine baseline 1.3.  -Creatinine peak this admission 1.6 -Improving, cr down to 1.3.  -Renal ultrasound without hydronephrosis  Diabetes type 2 -SSI.   Mild hyponatremia: Monitor  Moderate protein calorie malnutrition -Add supplements as tolerated    Estimated body mass index is 23.63 kg/m as calculated from the following:   Height as of this encounter:  (1.753 m).   Weight as of this encounter: 72.6 kg.   DVT prophylaxis: Heparin Code Status: Full code Family Communication: Discussed with patient Disposition Plan:  Status is: Inpatient  Remains inpatient appropriate because: Severity of illness   Dispo: The patient is from: Home              Anticipated d/c is to: Home              Anticipated d/c date is: 2 days              Patient currently is not medically stable to d/c.  Treating for infection, renal failure, anemia    Consultants:     Procedures:   Renal ultrasound: Negative for hydronephrosis findings consistent with cystitis  Antimicrobials:    Subjective: Feels better, left flank pain is improving, denies any shortness of breath or cough today, complains of severe phantom limb pain  Objective: Vitals:   06/27/20 0439 06/27/20 0907 06/27/20 1425 06/27/20 1452  BP: (!) 143/53 (!) 146/56 (!) 130/45 (!) 143/51  Pulse: 60 61 62 62  Resp: 18 18 15 16   Temp: 98.3 F (36.8 C) 98.6 F (37 C)  98.7 F (37.1 C) 98.5 F (36.9 C)  TempSrc:  Oral Oral Oral  SpO2: 97% 98% 97% 98%  Weight:      Height:        Intake/Output Summary (Last 24 hours) at 06/27/2020 1511 Last data filed at 06/27/2020 1318 Gross per 24 hour  Intake 820 ml  Output 825 ml  Net -5 ml   Filed Weights   06/25/20 1528  Weight: 72.6 kg    Examination:  General exam: Elderly frail chronically ill male sitting up in bed, AAOx3 HEENT: No JVD CVS: S1-S2, regular rate rhythm, systolic ejection murmur Lungs: Clear bilaterally Abdomen: Soft, nontender, bowel sounds present Extremities: Right AKA, left foot with dressing  Skin: Left foot wound with dressing    Data Reviewed: I have personally reviewed following labs and imaging studies  CBC: Recent Labs  Lab 06/21/20 0250 06/25/20 1656 06/26/20 0300 06/27/20 0919  WBC 11.7* 22.5* 14.8* 10.5  NEUTROABS 7.1 18.7*  --   --   HGB 8.1* 7.7* 7.0* 7.0*  HCT 24.7* 24.4* 22.0* 21.9*  MCV 83.7 85.0 84.0 84.9  PLT 210 253 215 259   Basic Metabolic Panel: Recent Labs  Lab 06/21/20 0250 06/25/20 1656 06/26/20 0300 06/27/20 0919  NA 137 136 134* 134*  K 4.0 3.8 4.0 3.7  CL 104 101 103 104  CO2 23 25 24 26   GLUCOSE 106* 134* 133* 188*  BUN 16 18 22 16   CREATININE 1.37* 1.69* 1.50* 1.33*  CALCIUM 8.8* 8.5* 8.1* 8.3*  MG 1.8  --   --   --    GFR: Estimated Creatinine Clearance: 47.3 mL/min (A) (by C-G formula based on SCr of 1.33 mg/dL (H)). Liver Function Tests: Recent Labs  Lab 06/21/20 0250 06/25/20 1656  AST 12* 16  ALT 10 12  ALKPHOS 88 79  BILITOT 0.5 0.5  PROT 6.7 6.8  ALBUMIN 2.4* 2.1*   No results for input(s): LIPASE, AMYLASE in the last 168 hours. No results for input(s): AMMONIA in the last 168 hours. Coagulation Profile: Recent Labs  Lab 06/25/20 1656  INR 1.2   Cardiac Enzymes: No results for input(s): CKTOTAL, CKMB, CKMBINDEX, TROPONINI in the last 168 hours. BNP (last 3 results) No results for input(s): PROBNP in  the last 8760 hours. HbA1C: No results for input(s): HGBA1C in the last 72 hours. CBG: Recent Labs  Lab 06/26/20 1136 06/26/20 1653 06/26/20 2133 06/27/20 0747 06/27/20 1114  GLUCAP 135* 108* 110* 127* 129*   Lipid Profile: No results for input(s): CHOL, HDL, LDLCALC, TRIG, CHOLHDL, LDLDIRECT in the last 72 hours. Thyroid Function Tests: No results for input(s): TSH, T4TOTAL, FREET4, T3FREE, THYROIDAB in the last 72 hours. Anemia Panel: Recent Labs    06/26/20 0810  VITAMINB12 129*  FOLATE 7.2  FERRITIN 402*  TIBC 168*  IRON 16*  RETICCTPCT 1.2   Sepsis Labs: Recent Labs  Lab 06/21/20 0250 06/25/20 1656 06/25/20 2222  PROCALCITON <0.10  --   --   LATICACIDVEN  --  0.8 1.2    Recent Results (from the past 240  hour(s))  Blood culture (routine x 2)     Status: None   Collection Time: 06/18/20  1:00 PM   Specimen: BLOOD LEFT HAND  Result Value Ref Range Status   Specimen Description BLOOD LEFT HAND  Final   Special Requests   Final    BOTTLES DRAWN AEROBIC ONLY Blood Culture results may not be optimal due to an inadequate volume of blood received in culture bottles   Culture   Final    NO GROWTH 5 DAYS Performed at [facility], [address], [place], [place] [address]    Report Status 06/23/2020 FINAL  Final  Blood culture (routine x 2)     Status: None   Collection Time: 06/18/20  1:07 PM   Specimen: BLOOD  Result Value Ref Range Status   Specimen Description BLOOD SITE NOT SPECIFIED  Final   Special Requests   Final    BOTTLES DRAWN AEROBIC ONLY Blood Culture adequate volume   Culture   Final    NO GROWTH 5 DAYS Performed at [facility], [address], [place], [place] [address]    Report Status 06/23/2020 FINAL  Final  SARS Coronavirus 2 by RT PCR (hospital order, performed in [facility] hospital lab) Nasopharyngeal Nasopharyngeal Swab     Status: None   Collection Time: 06/18/20  4:00 PM   Specimen: Nasopharyngeal Swab  Result Value  Ref Range Status   SARS Coronavirus 2 NEGATIVE NEGATIVE Final    Comment: (NOTE) SARS-CoV-2 target nucleic acids are NOT DETECTED.  The SARS-CoV-2 RNA is generally detectable in upper and lower respiratory specimens during the acute phase of infection. The lowest concentration of SARS-CoV-2 viral copies this assay can detect is 250 copies / mL. A negative result does not preclude SARS-CoV-2 infection and should not be used as the sole basis for treatment or other patient management decisions.  A negative result may occur with improper specimen collection / handling, submission of specimen other than nasopharyngeal swab, presence of viral mutation(s) within the areas targeted by this assay, and inadequate number of viral copies (<250 copies / mL). A negative result must be combined with clinical observations, patient history, and epidemiological information.  Fact Sheet for Patients:   BoilerBrush.com.cy  Fact Sheet for Healthcare Providers: https://pope.com/  This test is not yet approved or  cleared by the [place] FDA and has been authorized for detection and/or diagnosis of SARS-CoV-2 by FDA under an Emergency Use Authorization (EUA).  This EUA will remain in effect (meaning this test can be used) for the duration of the COVID-19 declaration under Section 564(b)(1) of the Act, 21 U.S.C. section 360bbb-3(b)(1), unless the authorization is terminated or revoked sooner.  Performed at [facility], [address] [place], [place] [address]   MRSA PCR Screening     Status: None   Collection Time: 06/18/20  4:00 PM   Specimen: Nasopharyngeal Wash  Result Value Ref Range Status   MRSA by PCR NEGATIVE NEGATIVE Final    Comment:        The GeneXpert MRSA Assay (FDA approved for NASAL specimens only), is one component of a comprehensive MRSA colonization surveillance program. It is not intended to diagnose  MRSA infection nor to guide or monitor treatment for MRSA infections. Performed at [facility], [address] [place], [place] [address]   Surgical pcr screen     Status: None   Collection Time: 06/18/20  9:44 PM  Specimen: Nasal Mucosa; Nasal Swab  Result Value Ref Range Status   MRSA, PCR NEGATIVE NEGATIVE Final   Staphylococcus aureus NEGATIVE NEGATIVE Final    Comment: (NOTE) The Xpert SA Assay (FDA approved for NASAL specimens in patients 54 years of age and older), is one component of a comprehensive surveillance program. It is not intended to diagnose infection nor to guide or monitor treatment. Performed at [facility], [address] [place], [place] [address]   Culture, blood (Routine x 2)     Status: None (Preliminary result)   Collection Time: 06/25/20  4:35 PM   Specimen: BLOOD  Result Value Ref Range Status   Specimen Description BLOOD SITE NOT SPECIFIED  Final   Special Requests   Final    BOTTLES DRAWN AEROBIC AND ANAEROBIC Blood Culture results may not be optimal due to an inadequate volume of blood received in culture bottles   Culture   Final    NO GROWTH 2 DAYS Performed at [facility], [address] [place], [place] [address]    Report Status PENDING  Incomplete  Culture, blood (Routine x 2)     Status: None (Preliminary result)   Collection Time: 06/25/20  5:06 PM   Specimen: BLOOD  Result Value Ref Range Status   Specimen Description BLOOD SITE NOT SPECIFIED  Final   Special Requests   Final    BOTTLES DRAWN AEROBIC AND ANAEROBIC Blood Culture adequate volume   Culture   Final    NO GROWTH 2 DAYS Performed at [facility], [address] [place], [place] [address]    Report Status PENDING  Incomplete  SARS Coronavirus 2 by RT PCR (hospital order, performed in [facility] hospital lab) Nasopharyngeal Nasopharyngeal Swab     Status: None   Collection Time: 06/25/20  8:03 PM   Specimen: Nasopharyngeal Swab  Result  Value Ref Range Status   SARS Coronavirus 2 NEGATIVE NEGATIVE Final    Comment: (NOTE) SARS-CoV-2 target nucleic acids are NOT DETECTED.  The SARS-CoV-2 RNA is generally detectable in upper and lower respiratory specimens during the acute phase of infection. The lowest concentration of SARS-CoV-2 viral copies this assay can detect is 250 copies / mL. A negative result does not preclude SARS-CoV-2 infection and should not be used as the sole basis for treatment or other patient management decisions.  A negative result may occur with improper specimen collection / handling, submission of specimen other than nasopharyngeal swab, presence of viral mutation(s) within the areas targeted by this assay, and inadequate number of viral copies (<250 copies / mL). A negative result must be combined with clinical observations, patient history, and epidemiological information.  Fact Sheet for Patients:   BoilerBrush.com.cy  Fact Sheet for Healthcare Providers: https://pope.com/  This test is not yet approved or  cleared by the [place] FDA and has been authorized for detection and/or diagnosis of SARS-CoV-2 by FDA under an Emergency Use Authorization (EUA).  This EUA will remain in effect (meaning this test can be used) for the duration of the COVID-19 declaration under Section 564(b)(1) of the Act, 21 U.S.C. section 360bbb-3(b)(1), unless the authorization is terminated or revoked sooner.  Performed at [facility], [address], [place], [place] [address]   Urine Culture     Status: Abnormal   Collection Time: 06/26/20 12:37 PM   Specimen: Urine, Random  Result Value Ref Range Status   Specimen Description URINE, RANDOM  Final  Special Requests   Final    NONE Performed at [facility], [address], [place], [place] [address]    Culture MULTIPLE SPECIES PRESENT, SUGGEST RECOLLECTION (A)  Final   Report Status  06/27/2020 FINAL  Final         Radiology Studies: DG Chest 2 View  Result Date: 06/25/2020 CLINICAL DATA:  Sepsis EXAM: CHEST - 2 VIEW COMPARISON:  June 18, 2020 FINDINGS: Again noted is a chronic right-sided pleural effusion. However, there is increased attenuation throughout the right mid and right upper lung zones. The heart size is stable. Aortic calcifications are again noted. There is no definite acute osseous abnormality. There is no pneumothorax. IMPRESSION: 1. Increasing attenuation throughout the right mid and right upper lung zones may represent developing pneumonia. 2. Chronic right-sided pleural effusion. Electronically Signed   By: Katherine Mantle M.D.   On: 06/25/2020 16:04   CT Head Wo Contrast  Result Date: 06/25/2020 CLINICAL DATA:  74 year old male recently discharged following treatment for vascular disease, gangrene. Altered mental status. EXAM: CT HEAD WITHOUT CONTRAST TECHNIQUE: Contiguous axial images were obtained from the base of the skull through the vertex without intravenous contrast. COMPARISON:  Head CT 11/03/2018. FINDINGS: Brain: Stable cerebral volume since 2019. No midline shift, ventriculomegaly, mass effect, evidence of mass lesion, intracranial hemorrhage or evidence of cortically based acute infarction. Small dystrophic calcification in the left pons is unchanged. Mild to moderate for age patchy bilateral white matter hypodensity has not significantly changed. No cortical encephalomalacia identified. Vascular: Calcified atherosclerosis at the skull base. No suspicious intracranial vascular hyperdensity. Skull: Chronic appearing changes to the maxillary alveolus likely from remote dental disease. Absent maxillary dentition. No acute osseous abnormality identified. Sinuses/Orbits: Visualized paranasal sinuses and mastoids are stable and well pneumatized. Other: No acute orbit or scalp soft tissue finding. IMPRESSION: 1. No acute intracranial abnormality. 2.  Chronic cerebral white matter disease not significantly changed since 2019. Electronically Signed   By: Odessa Fleming M.D.   On: 06/25/2020 16:19   US RENAL  Result Date: 06/25/2020 CLINICAL DATA:  74 year old male with flank pain. EXAM: RENAL / URINARY TRACT ULTRASOUND COMPLETE COMPARISON:  CT abdomen pelvis dated 07/07/2018. FINDINGS: Right Kidney: Renal measurements: 10.1 x 4.8 x 3.5 cm = volume: 89 mL. There is increased renal parenchyma echogenicity. There is a 1.4 cm upper pole cyst. There is an extrarenal pelvis with mild pelviectasis. No hydronephrosis or shadowing stone. Left Kidney: Renal measurements: 10.5 x 5.6 x 3.3 cm = volume: 101 mL. Increased renal parenchymal echogenicity. There is cortical irregularity and scarring. No hydronephrosis or shadowing stone. Bladder: Mildly thickened bladder wall may be related to underdistention or chronic bladder outlet obstruction. Correlation with urinalysis recommended to exclude cystitis. Right ureteral jet noted. Other: None. IMPRESSION: 1. Increased renal parenchymal echogenicity in keeping with medical renal disease. No hydronephrosis or shadowing stone. 2. Mildly thickened bladder wall. Correlation with urinalysis recommended to exclude cystitis. Electronically Signed   By: Elgie Collard M.D.   On: 06/25/2020 20:14   DG Foot 2 Views Left  Result Date: 06/25/2020 CLINICAL DATA:  Left foot pain fever ulcer at the heel EXAM: LEFT FOOT - 2 VIEW COMPARISON:  06/18/2020 FINDINGS: Limited by generalized osteopenia. No gross fracture or malalignment. No definitive bony destructive change. Extensive vascular calcification. Small heel ulcer. No radiopaque foreign body. IMPRESSION: 1. Limited by osteopenia. No definite acute osseous abnormality. 2. Small heel ulcer. Electronically Signed   By: Adrian Prows.D.  On: 06/25/2020 19:42        Scheduled Meds: . sodium chloride   Intravenous Once  . aspirin EC  81 mg Oral Daily  . atorvastatin  20 mg Oral  Daily  . azithromycin  500 mg Oral Daily  . brimonidine  1 drop Both Eyes BID  . clopidogrel  75 mg Oral Q breakfast  . cyanocobalamin  1,000 mcg Intramuscular Daily  . ferrous sulfate  325 mg Oral Q breakfast  . gabapentin  300 mg Oral BID  . heparin injection (subcutaneous)  5,000 Units Subcutaneous Q12H  . insulin aspart  0-9 Units Subcutaneous TID WC  . metoprolol tartrate  25 mg Oral BID  . timolol  1 drop Both Eyes BID   Continuous Infusions: . cefTRIAXone (ROCEPHIN)  IV 1 g (06/26/20 1838)     LOS: 2 days    Time spent:25 minutes.    Zannie CovePreetha Aarsh Fristoe, MD [facility]  06/27/2020, 3:11 PM

## 2020-06-28 DIAGNOSIS — R531 Weakness: Secondary | ICD-10-CM

## 2020-06-28 LAB — GLUCOSE, CAPILLARY
Glucose-Capillary: 98 mg/dL (ref 70–99)
Glucose-Capillary: 136 mg/dL — ABNORMAL HIGH (ref 70–99)

## 2020-06-28 LAB — BASIC METABOLIC PANEL
Anion gap: 10 (ref 5–15)
BUN: 16 mg/dL (ref 8–23)
CO2: 24 mmol/L (ref 22–32)
Calcium: 8.5 mg/dL — ABNORMAL LOW (ref 8.9–10.3)
Chloride: 102 mmol/L (ref 98–111)
Creatinine, Ser: 1.29 mg/dL — ABNORMAL HIGH (ref 0.61–1.24)
GFR calc Af Amer: >60 mL/min (ref >60)
GFR calc non Af Amer: 53 mL/min — ABNORMAL LOW (ref >60)
Glucose, Bld: 95 mg/dL (ref 70–99)
Potassium: 3.7 mmol/L (ref 3.5–5.1)
Sodium: 136 mmol/L (ref 135–145)

## 2020-06-28 LAB — CBC
HCT: 26.6 % — ABNORMAL LOW (ref 39.0–52.0)
Hemoglobin: 9 g/dL — ABNORMAL LOW (ref 13.0–17.0)
MCH: 28 pg (ref 26.0–34.0)
MCHC: 33.8 g/dL (ref 30.0–36.0)
MCV: 82.6 fL (ref 80.0–100.0)
Platelets: 299 10*3/uL (ref 150–400)
RBC: 3.22 MIL/uL — ABNORMAL LOW (ref 4.22–5.81)
RDW: 14.6 % (ref 11.5–15.5)
WBC: 11.7 10*3/uL — ABNORMAL HIGH (ref 4.0–10.5)
nRBC: 0.2 % (ref 0.0–0.2)

## 2020-06-28 LAB — TYPE AND SCREEN
ABO/RH(D): B POS
Antibody Screen: NEGATIVE
Unit division: 0

## 2020-06-28 LAB — BPAM RBC
Blood Product Expiration Date: 202108182359
ISSUE DATE / TIME: 202108041430
Unit Type and Rh: 7300

## 2020-06-28 MED ORDER — GABAPENTIN 300 MG PO CAPS: 300.0000 mg | ORAL_CAPSULE | Freq: Two times a day (BID) | ORAL | 0 refills | Status: AC

## 2020-06-28 MED ORDER — FERROUS SULFATE 325 (65 FE) MG PO TABS: 325.0000 mg | ORAL_TABLET | Freq: Two times a day (BID) | ORAL | 1 refills | Status: AC

## 2020-06-28 MED ORDER — HYDRALAZINE HCL 100 MG PO TABS: 50.0000 mg | ORAL_TABLET | Freq: Three times a day (TID) | ORAL | Status: DC

## 2020-06-28 NOTE — Discharge Summary (Signed)
Physician Discharge Summary  Jeremiah [name] URK:[id number] DOB: [birth year] DOA: 06/25/2020  PCP: System, Pcp Not In  Admit date: 06/25/2020 Discharge date: 06/28/2020  Time spent: 35 minutes  Recommendations for Outpatient Follow-up:  1. PCP in 1 week 2. Vascular surgery Dr. Chestine Spore in 10 days 3. Urology in 1 month 4. Resume oral iron and B12 for anemia, consider nonurgent work-up of iron deficiency anemia 5. Home health PT OT   Discharge Diagnoses:  Active Problems:   Fever   Acute lower UTI   Weakness Suspected pneumonia Peripheral vascular disease, left SFA occlusion status post stent Iron deficiency anemia Acute hypoxic respiratory failure [name] AKI on [name] Chronic diastolic CHF Aortic stenosis Diabetes mellitus type 2 Moderate protein calorie malnutrition  Discharge Condition: Stable  Diet recommendation: Diabetic, low-sodium  Filed Weights   06/25/20 1528  Weight: 72.6 kg    History of present illness:   74 year old with past medical history significant for PAD with recent left foot ischemia on left heel wound ulcer status post left SFA and above-knee popliteal artery angioplasty with a stent placement for SFA chronic total occlusion on 7/28 2021, remote right AKA, chronic diastolic heart failure, hypertension, diabetes type 2,  moderate aortic stenosis, sleep apnea noncompliant with mask who presents with generalized weakness, fever and left flank pain.  He described pain as dull, 6 out of 10 radiating down to the left pelvis area.  He report chronic urinary frequency and incontinence.  He reports pain with urination.  ED patient was found to be hypoxic with oxygen saturation 89 he was placed on 2 L.  Chest x-ray showed increased right marking, white blood cell 22.  UA showed more than 50 white blood cell.  CT head was negative.  Hospital Course:    UTI and or PNA;  -Initially  sepsis suspected to be present, but subsequently ruled out -Admitted  with fever, left flank pain, abnormal urinalysis, in addition also had a cough and chest x ray noted increasing attenuation throughout the right mid and right upper lung sounds may represent developing pneumonia.    Chronic small right pleural effusion. -UA with more 50  white blood cells. -Clinically improving, treated with IV ceftriaxone and azithromycin, completed 5 days of antibiotics today -Afebrile nontoxic, urine culture with multiple species and blood cultures noted no growth -Discontinued antibiotics at discharge -PT OT eval completed, home health services were recommended and set up at discharge -Follow-up with PCP in 1 week -Also advised follow-up with urology in 1 month to evaluate for BPH  Iron deficiency and [name] anemia: -Transfuse 1 unit of PRBC this admission  -Also given IV iron  -And treated with vitamin B12 which she received IM for 4 days  -Resume iron and oral B12 at discharge -Iron deficiency anemia work-up as outpatient  Acute hypoxic Respiratory failure; suspect related to PNA.  -Continue with ceftriaxone and Zithromax.  -Continue with oxygen supplementation.  -Incentive spirometry.   Critical Limb ischemia left lower extremity and heel ulcer wound;  Present on admission. Pressure wound, vascular insufficiency/  -S/P SFA and above knee popliteal artery angioplasty with stent placement for long SFA chronic occlusion on 7/28 -Wound care consulted.  -Continue aspirin, Plavix, statin -Follow-up with Dr. Chestine Spore vascular  History of PAD -Right AKA -With severe phantom limb pain, increase gabapentin to 200 mg  Aortic stenosis -Needs follow-up with cardiology as an outpatient  Chronic diastolic heart failure: -Not on Lasix at home.  Hypertension: -Continue with metoprolol  AKI on [name]: -Creatinine baseline 1.3.  -Creatinine peak this admission 1.6 -Improving, cr down to 1.3.  -Renal ultrasound without hydronephrosis  Diabetes  type 2 -SSI.   Mild hyponatremia: Monitor  Moderate protein calorie malnutrition -Add supplements as tolerated  Estimated body mass index is 23.63 kg/m as calculated from the following:   Height as of this encounter:  (1.753 m).   Weight as of this encounter: 72.6 kg.   Discharge Exam: Vitals:   06/27/20 2123 06/28/20 0524  BP: (!) 173/68 (!) 181/67  Pulse: 65 (!) 56  Resp: 18 17  Temp:  98.4 F (36.9 C)  SpO2: 99% 96%    General: AAOx2, cognitive deficits noted Cardiovascular: S1S2/RRR Respiratory: CTAB  Discharge Instructions   Discharge Instructions    Diet - low sodium heart healthy   Complete by: As directed    Diet Carb Modified   Complete by: As directed    Discharge wound care:   Complete by: As directed    Left heel wound, clean with soap and water, rinse and pat dry.  Paint liberally with Betadine swab stick and allow to air dry, when dry cover with dry gauze 4 x 4 and secure with few turns of Kerlix roll gauze.  Placed foot into Prevalon boot   Increase activity slowly   Complete by: As directed      Allergies as of 06/28/2020   No Known Allergies     Medication List    TAKE these medications   acetaminophen 500 MG tablet Commonly known as: TYLENOL Take 1,000 mg by mouth every 6 (six) hours as needed for moderate pain or headache.   aspirin 81 MG tablet Take 81 mg by mouth daily.   atorvastatin 20 MG tablet Commonly known as: LIPITOR Take 1 tablet (20 mg total) by mouth daily.   brimonidine 0.2 % ophthalmic solution Commonly known as: ALPHAGAN Place 1 drop into both eyes 2 (two) times daily.   clopidogrel 75 MG tablet Commonly known as: PLAVIX Take 1 tablet (75 mg total) by mouth daily with breakfast.   ferrous sulfate 325 (65 FE) MG tablet Take 1 tablet (325 mg total) by mouth 2 (two) times daily with a meal.   gabapentin 300 MG capsule Commonly known as: NEURONTIN Take 1 capsule (300 mg total) by mouth 2 (two) times  daily. What changed:   medication strength  how much to take  when to take this   glimepiride 1 MG tablet Commonly known as: AMARYL Take 1 tablet (1 mg total) by mouth daily with breakfast.   glucose blood test strip Accu-Chek Aviva Plus test strips  Take 1 strip 3 times a day by miscell. route for 90 days.   glucose blood test strip Accu-Chek Aviva Plus test strips  USE AS DIRECTED THREE TIMES DAILY.   hydrALAZINE 100 MG tablet Commonly known as: APRESOLINE Take 0.5 tablets (50 mg total) by mouth every 8 (eight) hours. What changed: how much to take   metoprolol succinate 50 MG 24 hr tablet Commonly known as: TOPROL-XL Take 50 mg by mouth daily.   timolol 0.5 % ophthalmic solution Commonly known as: TIMOPTIC Place 1 drop into both eyes 2 (two) times daily.   traMADol 50 MG tablet Commonly known as: ULTRAM Take 50 mg by mouth every 8 (eight) hours as needed for moderate pain.            Discharge Care Instructions  (From admission, onward)  Start     Ordered   06/28/20 0000  Discharge wound care:       Comments: Left heel wound, clean with soap and water, rinse and pat dry.  Paint liberally with Betadine swab stick and allow to air dry, when dry cover with dry gauze 4 x 4 and secure with few turns of Kerlix roll gauze.  Placed foot into Prevalon boot   06/28/20 1106         No Known Allergies  Follow-up Information    Rinaldo Cloud, MD. Schedule an appointment as soon as possible for a visit in 1 week(s).   Specialty: Cardiology Contact information: [address] [place] [address] [phone]        Cephus Shelling, MD. Schedule an appointment as soon as possible for a visit in 10 day(s).   Specialty: Vascular Surgery Contact information: [address] [place] [address] [phone]        Urology. Schedule an appointment as soon as possible for a visit in 1 month(s).                The results  of significant diagnostics from this hospitalization (including imaging, microbiology, ancillary and laboratory) are listed below for reference.    Significant Diagnostic Studies: DG Chest 2 View  Result Date: 06/25/2020 CLINICAL DATA:  Sepsis EXAM: CHEST - 2 VIEW COMPARISON:  June 18, 2020 FINDINGS: Again noted is a chronic right-sided pleural effusion. However, there is increased attenuation throughout the right mid and right upper lung zones. The heart size is stable. Aortic calcifications are again noted. There is no definite acute osseous abnormality. There is no pneumothorax. IMPRESSION: 1. Increasing attenuation throughout the right mid and right upper lung zones may represent developing pneumonia. 2. Chronic right-sided pleural effusion. Electronically Signed   By: Katherine Mantle M.D.   On: 06/25/2020 16:04   CT Head Wo Contrast  Result Date: 06/25/2020 CLINICAL DATA:  74 year old male recently discharged following treatment for vascular disease, gangrene. Altered mental status. EXAM: CT HEAD WITHOUT CONTRAST TECHNIQUE: Contiguous axial images were obtained from the base of the skull through the vertex without intravenous contrast. COMPARISON:  Head CT 11/03/2018. FINDINGS: Brain: Stable cerebral volume since 2019. No midline shift, ventriculomegaly, mass effect, evidence of mass lesion, intracranial hemorrhage or evidence of cortically based acute infarction. Small dystrophic calcification in the left pons is unchanged. Mild to moderate for age patchy bilateral white matter hypodensity has not significantly changed. No cortical encephalomalacia identified. Vascular: Calcified atherosclerosis at the skull base. No suspicious intracranial vascular hyperdensity. Skull: Chronic appearing changes to the maxillary alveolus likely from remote dental disease. Absent maxillary dentition. No acute osseous abnormality identified. Sinuses/Orbits: Visualized paranasal sinuses and mastoids are stable and well  pneumatized. Other: No acute orbit or scalp soft tissue finding. IMPRESSION: 1. No acute intracranial abnormality. 2. Chronic cerebral white matter disease not significantly changed since 2019. Electronically Signed   By: Odessa Fleming M.D.   On: 06/25/2020 16:19   US RENAL  Result Date: 06/25/2020 CLINICAL DATA:  74 year old male with flank pain. EXAM: RENAL / URINARY TRACT ULTRASOUND COMPLETE COMPARISON:  CT abdomen pelvis dated 07/07/2018. FINDINGS: Right Kidney: Renal measurements: 10.1 x 4.8 x 3.5 cm = volume: 89 mL. There is increased renal parenchyma echogenicity. There is a 1.4 cm upper pole cyst. There is an extrarenal pelvis with mild pelviectasis. No hydronephrosis or shadowing stone. Left Kidney: Renal measurements: 10.5 x 5.6 x 3.3 cm = volume:  101 mL. Increased renal parenchymal echogenicity. There is cortical irregularity and scarring. No hydronephrosis or shadowing stone. Bladder: Mildly thickened bladder wall may be related to underdistention or chronic bladder outlet obstruction. Correlation with urinalysis recommended to exclude cystitis. Right ureteral jet noted. Other: None. IMPRESSION: 1. Increased renal parenchymal echogenicity in keeping with medical renal disease. No hydronephrosis or shadowing stone. 2. Mildly thickened bladder wall. Correlation with urinalysis recommended to exclude cystitis. Electronically Signed   By: Elgie Collard M.D.   On: 06/25/2020 20:14   PERIPHERAL VASCULAR CATHETERIZATION  Result Date: 06/20/2020 Patient name: Jeremiah [name]         MRN: [id number]        DOB: [birth year]          Sex: male  06/20/2020 Pre-operative Diagnosis: Critical limb ischemia left lower extremity with heel wound Post-operative diagnosis:  Same Surgeon:  Cephus Shelling, MD Procedure Performed: 1.  Ultrasound-guided access of right common femoral artery 2.  Aortogram including catheter selection of aorta 3.  Left lower extremity arteriogram with selection of third order branches 4.   Left SFA and above knee popliteal artery angioplasty with stent placement for long SFA chronic total occlusion (predilated with a 4 mm jade, primarily stented with a 6 mm x 120 mm drug-coated Eluvia x3 and 6 mm x 80 mm drug-coated Eluvia, all stents postdilated with a 5 mm Jade) 5.  90 minutes of monitored moderate conscious sedation time  Indications: Patient is a 74 year old male who previously has been under the care of Dr. [name].  He presented with a left heel wound and was seen by Dr. Darrick Penna for consultation.  He presents today for planned arteriogram and possible intervention for limb salvage after risk benefits discussed.  Findings:  Aortogram showed patent aortoiliac segment with no overt flow-limiting stenosis.  Does have some calcification in the left common iliac but he has a very good left femoral pulse and this does not appear flow-limiting.  Left lower extremity arteriogram shows a patent common femoral and profunda with a very diseased proximal SFA stump.  He then has a long segment SFA chronic total occlusion throughout the mid distal segment with reconstitution of the above-knee popliteal artery and two vessel runoff via the peroneal and posterior tibial artery.  Ultimately I was able to cross left SFA above-knee popliteal occlusion and get back in the true lumen just above the knee joint.  This was all predilated with a 4 mm jade angioplasty balloon and then primarily stented with a 6 mm drug covered Eluvia stents and postdilated with a 5 mm angioplasty balloon.             Procedure:  The patient was identified in the holding area and taken to room 8.  The patient was then placed supine on the table and prepped and draped in the usual sterile fashion.  A time out was called.  Ultrasound was used to evaluate the right common femoral artery.  It was patent .  A digital ultrasound image was acquired.  A micropuncture needle was used to access the right common femoral artery under ultrasound  guidance.  An 018 wire was advanced without resistance and a micropuncture sheath was placed.  The 018 wire was removed and a benson wire was placed.  The micropuncture sheath was exchanged for a 5 french sheath.  An omniflush catheter was advanced over the wire to the level of L-1.  An abdominal angiogram was obtained.  Next, using the  omniflush catheter and a benson wire, the aortic bifurcation was crossed and the catheter was placed into theleft external iliac artery and left runoff was obtained.  Pertinent findings are noted above but he appeared to have long segment SFA occlusion with reconstitution of an above-knee popliteal artery.  There was a proximal stump of SFA to work with although it was diseased.  That point time used a Rosen wire and exchanged for 6 [employer] sheath in the right groin over the aortic bifurcation.  Patient was given 100 units/kg heparin and ACT was checked to maintain greater than 250.  Subsequently used a Glidewire advantage with a 035 quick cross and cannulated the proximal SFA and after multiple manipulations including exchanging for a soft angled Glidewire was finally able to get across the SFA into the above-knee popliteal artery across the CTO.  Initially I was in a dissection distally in the above knee popliteal artery although the true lumen of the artery was filling suggesting these communicated.  I then ultimately used a multitude of maneuvers with the angled glidwire and finally got into the true lumen in the behind the knee popliteal artery and ultimately put a Rosen wire down into the below-knee popliteal artery after we confirmed we were in the true lumen.  I then used a long 4 mm jade angioplasty balloon and the entire SFA and above-knee popliteal artery was angioplastied to nominal pressure for 2 minutes.  I then planned to primarily stent and we deployed three 6 mm x 150 mm drug coated Eluvia stents and a 6 mm x 80 mm Eluvia from the above-knee popliteal artery  all the way across Hunter's canal up to the takeoff of SFA at the ostium.  All of the stents were then postdilated with a 5 mm jade angioplasty balloon throughout the entire segment.  Final hand-injection after pulling the wire back showed excellent inline flow down the SFA stents with filling of the behind the knee and below-knee popliteal artery and preserved two-vessel runoff via the posterior tibial peroneal artery.  There is no evidence of residual dissection.  Wires and catheters were removed to be taken holding the sheath removed.  Plan: Patient will be loaded on aspirin Plavix.  He now has inline flow down the left lower extremity.  Cephus Shelling, MD [facility] Office: [phone]   DG Chest Port 1 View  Result Date: 06/18/2020 CLINICAL DATA:  74 year old male with necrotic foot wound. Possible sepsis. EXAM: PORTABLE CHEST 1 VIEW COMPARISON:  Chest CT 11/05/2018 and earlier. FINDINGS: Portable AP upright view at 1622 hours. Chronic right pleural effusion appears not significantly changed from December 2019. Stable lung volumes. Mediastinal contours remain within normal limits. Visualized tracheal air column is within normal limits. No superimposed pneumothorax or pulmonary edema. Allowing for portable technique the left lung is clear. Negative visible bowel gas pattern. No acute osseous abnormality identified. IMPRESSION: 1. Chronic right pleural effusion appears stable since December 2019. 2. No new cardiopulmonary abnormality. Electronically Signed   By: Odessa Fleming M.D.   On: 06/18/2020 16:35   DG Foot 2 Views Left  Result Date: 06/25/2020 CLINICAL DATA:  Left foot pain fever ulcer at the heel EXAM: LEFT FOOT - 2 VIEW COMPARISON:  06/18/2020 FINDINGS: Limited by generalized osteopenia. No gross fracture or malalignment. No definitive bony destructive change. Extensive vascular calcification. Small heel ulcer. No radiopaque foreign body. IMPRESSION: 1. Limited  by osteopenia. No definite acute osseous abnormality. 2. Small heel  ulcer. Electronically Signed   By: Jasmine Pang M.D.   On: 06/25/2020 19:42   DG Foot Complete Left  Result Date: 06/18/2020 CLINICAL DATA:  Diabetic foot ulcer. EXAM: LEFT FOOT - COMPLETE 3+ VIEW COMPARISON:  04/11/2020 FINDINGS: Open wound noted on the plantar aspect of the heel. No evidence of underlying osteomyelitis involving the calcaneus. Advanced osteoporosis noted and there are moderate forefoot, midfoot and hindfoot degenerative changes. No definite destructive bony lesions. Advanced vascular calcifications are noted. IMPRESSION: 1. Open wound on the plantar aspect of the heel. 2. No definite plain film findings for osteomyelitis. 3. Advanced osteoporosis and degenerative changes. Electronically Signed   By: Rudie Meyer M.D.   On: 06/18/2020 10:58   ECHOCARDIOGRAM COMPLETE  Result Date: 06/19/2020    ECHOCARDIOGRAM REPORT   Patient Name:   HAADI SANTELLAN Date of Exam: 06/19/2020 Medical Rec #:  [id number]       Height:       69.0 in Accession #:    [id number]      Weight:       160.0 lb Date of Birth:  [birth year]       BSA:          1.879 m Patient Age:    74 years        BP:           127/45 mmHg Patient Gender: M               HR:           57 bpm. Exam Location:  Inpatient Procedure: 2D Echo, Cardiac Doppler and Color Doppler Indications:    I50.31 Acute diastolic (congestive) heart failure  History:        Patient has prior history of Echocardiogram examinations, most                 recent 03/28/2018. Aortic Valve Disease, Signs/Symptoms:Altered                 Mental Status and Fever; Risk Factors:Hypertension, Diabetes and                 Dyslipidemia. Aortic stenosis.  Sonographer:    Sheralyn Boatman RDCS Referring Phys: [id number] [facility]  Sonographer Comments: Technically difficult study due to poor echo windows. IMPRESSIONS  1. Left ventricular ejection fraction, by estimation, is 70 to 75%. The left ventricle has  hyperdynamic function. The left ventricle has no regional wall motion abnormalities. There is moderate left ventricular hypertrophy. Left ventricular diastolic parameters are consistent with Grade I diastolic dysfunction (impaired relaxation).  2. Right ventricular systolic function is normal. The right ventricular size is normal.  3. The mitral valve is normal in structure. No evidence of mitral valve regurgitation. No evidence of mitral stenosis.  4. The aortic valve has an indeterminant number of cusps. Aortic valve regurgitation is not visualized. Moderate aortic valve stenosis. Aortic valve area, by VTI measures 1.34 cm. Aortic valve mean gradient measures 23.8 mmHg. Aortic valve Vmax measures 3.27 m/s.  5. The inferior vena cava is normal in size with greater than 50% respiratory variability, suggesting right atrial pressure of 3 mmHg. Comparison(s): Aortic stenosis has advanced (prior mean gradient 16 mmHg). FINDINGS  Left Ventricle: Left ventricular ejection fraction, by estimation, is 70 to 75%. The left ventricle has hyperdynamic function. The left ventricle has no regional wall motion abnormalities. The left ventricular internal cavity size was normal in size. There is moderate left ventricular hypertrophy. Left  ventricular diastolic parameters are consistent with Grade I diastolic dysfunction (impaired relaxation). Right Ventricle: The right ventricular size is normal. No increase in right ventricular wall thickness. Right ventricular systolic function is normal. Left Atrium: Left atrial size was normal in size. Right Atrium: Right atrial size was normal in size. Pericardium: There is no evidence of pericardial effusion. Mitral Valve: The mitral valve is normal in structure. Normal mobility of the mitral valve leaflets. No evidence of mitral valve regurgitation. No evidence of mitral valve stenosis. Tricuspid Valve: The tricuspid valve is normal in structure. Tricuspid valve regurgitation is not  demonstrated. No evidence of tricuspid stenosis. Aortic Valve: The aortic valve has an indeterminant number of cusps. . There is moderate thickening and moderate calcification of the aortic valve. Aortic valve regurgitation is not visualized. Moderate aortic stenosis is present. There is moderate thickening of the aortic valve. There is moderate calcification of the aortic valve. Aortic valve mean gradient measures 23.8 mmHg. Aortic valve peak gradient measures 42.7 mmHg. Aortic valve area, by VTI measures 1.34 cm. Pulmonic Valve: The pulmonic valve was normal in structure. Pulmonic valve regurgitation is not visualized. No evidence of pulmonic stenosis. Aorta: The aortic root is normal in size and structure. Venous: The inferior vena cava is normal in size with greater than 50% respiratory variability, suggesting right atrial pressure of 3 mmHg. IAS/Shunts: No atrial level shunt detected by color flow Doppler.  LEFT VENTRICLE PLAX 2D LVIDd:         4.00 cm     Diastology LVIDs:         2.50 cm     LV e' lateral:   6.20 cm/s LV PW:         1.50 cm     LV E/e' lateral: 16.3 LV IVS:        1.40 cm     LV e' medial:    6.31 cm/s LVOT diam:     1.80 cm     LV E/e' medial:  16.0 LV SV:         104 LV SV Index:   55 LVOT Area:     2.54 cm  LV Volumes (MOD) LV vol d, MOD A2C: 85.2 ml LV vol d, MOD A4C: 68.9 ml LV vol s, MOD A2C: 24.4 ml LV vol s, MOD A4C: 23.3 ml LV SV MOD A2C:     60.8 ml LV SV MOD A4C:     68.9 ml LV SV MOD BP:      51.9 ml RIGHT VENTRICLE             IVC RV S prime:     12.50 cm/s  IVC diam: 1.80 cm TAPSE (M-mode): 2.9 cm LEFT ATRIUM           Index       RIGHT ATRIUM           Index LA diam:      3.20 cm 1.70 cm/m  RA Area:     11.20 cm LA Vol (A2C): 24.8 ml 13.20 ml/m RA Volume:   22.60 ml  12.03 ml/m LA Vol (A4C): 35.0 ml 18.63 ml/m  AORTIC VALVE AV Area (Vmax):    1.55 cm AV Area (Vmean):   1.30 cm AV Area (VTI):     1.34 cm AV Vmax:           326.60 cm/s AV Vmean:          226.400 cm/s AV  VTI:  0.774 m AV Peak Grad:      42.7 mmHg AV Mean Grad:      23.8 mmHg LVOT Vmax:         199.00 cm/s LVOT Vmean:        116.000 cm/s LVOT VTI:          0.408 m LVOT/AV VTI ratio: 0.53  AORTA Ao Root diam: 2.80 cm MITRAL VALVE MV Area (PHT): 2.56 cm     SHUNTS MV Decel Time: 296 msec     Systemic VTI:  0.41 m MV E velocity: 101.00 cm/s  Systemic Diam: 1.80 cm MV A velocity: 130.00 cm/s MV E/A ratio:  0.78 Donato Schultz MD Electronically signed by Donato Schultz MD Signature Date/Time: 06/19/2020/12:21:07 PM    Final     Microbiology: Recent Results (from the past 240 hour(s))  SARS Coronavirus 2 by RT PCR (hospital order, performed in [facility] hospital lab) Nasopharyngeal Nasopharyngeal Swab     Status: None   Collection Time: 06/18/20  4:00 PM   Specimen: Nasopharyngeal Swab  Result Value Ref Range Status   SARS Coronavirus 2 NEGATIVE NEGATIVE Final    Comment: (NOTE) SARS-CoV-2 target nucleic acids are NOT DETECTED.  The SARS-CoV-2 RNA is generally detectable in upper and lower respiratory specimens during the acute phase of infection. The lowest concentration of SARS-CoV-2 viral copies this assay can detect is 250 copies / mL. A negative result does not preclude SARS-CoV-2 infection and should not be used as the sole basis for treatment or other patient management decisions.  A negative result may occur with improper specimen collection / handling, submission of specimen other than nasopharyngeal swab, presence of viral mutation(s) within the areas targeted by this assay, and inadequate number of viral copies (<250 copies / mL). A negative result must be combined with clinical observations, patient history, and epidemiological information.  Fact Sheet for Patients:   BoilerBrush.com.cy  Fact Sheet for Healthcare Providers: https://pope.com/  This test is not yet approved or  cleared by the [place] FDA and has been  authorized for detection and/or diagnosis of SARS-CoV-2 by FDA under an Emergency Use Authorization (EUA).  This EUA will remain in effect (meaning this test can be used) for the duration of the COVID-19 declaration under Section 564(b)(1) of the Act, 21 U.S.C. section 360bbb-3(b)(1), unless the authorization is terminated or revoked sooner.  Performed at [facility], [address] [place], [place] [address]   MRSA PCR Screening     Status: None   Collection Time: 06/18/20  4:00 PM   Specimen: Nasopharyngeal Wash  Result Value Ref Range Status   MRSA by PCR NEGATIVE NEGATIVE Final    Comment:        The GeneXpert MRSA Assay (FDA approved for NASAL specimens only), is one component of a comprehensive MRSA colonization surveillance program. It is not intended to diagnose MRSA infection nor to guide or monitor treatment for MRSA infections. Performed at [facility], [address] [place], [place] [address]   Surgical pcr screen     Status: None   Collection Time: 06/18/20  9:44 PM   Specimen: Nasal Mucosa; Nasal Swab  Result Value Ref Range Status   MRSA, PCR NEGATIVE NEGATIVE Final   Staphylococcus aureus NEGATIVE NEGATIVE Final    Comment: (NOTE) The Xpert SA Assay (FDA approved for NASAL specimens in patients 48 years of age and older), is one component of a comprehensive surveillance program. It is not intended  to diagnose infection nor to guide or monitor treatment. Performed at [facility], [address], [place], [place] [address]   Culture, blood (Routine x 2)     Status: None (Preliminary result)   Collection Time: 06/25/20  4:35 PM   Specimen: BLOOD  Result Value Ref Range Status   Specimen Description BLOOD SITE NOT SPECIFIED  Final   Special Requests   Final    BOTTLES DRAWN AEROBIC AND ANAEROBIC Blood Culture results may not be optimal due to an inadequate volume of blood received in culture bottles   Culture   Final    NO GROWTH 3  DAYS Performed at [facility], [address] [place], [place] [address]    Report Status PENDING  Incomplete  Culture, blood (Routine x 2)     Status: None (Preliminary result)   Collection Time: 06/25/20  5:06 PM   Specimen: BLOOD  Result Value Ref Range Status   Specimen Description BLOOD SITE NOT SPECIFIED  Final   Special Requests   Final    BOTTLES DRAWN AEROBIC AND ANAEROBIC Blood Culture adequate volume   Culture   Final    NO GROWTH 3 DAYS Performed at [facility], [address] [place], [place] [address]    Report Status PENDING  Incomplete  SARS Coronavirus 2 by RT PCR (hospital order, performed in [facility] hospital lab) Nasopharyngeal Nasopharyngeal Swab     Status: None   Collection Time: 06/25/20  8:03 PM   Specimen: Nasopharyngeal Swab  Result Value Ref Range Status   SARS Coronavirus 2 NEGATIVE NEGATIVE Final    Comment: (NOTE) SARS-CoV-2 target nucleic acids are NOT DETECTED.  The SARS-CoV-2 RNA is generally detectable in upper and lower respiratory specimens during the acute phase of infection. The lowest concentration of SARS-CoV-2 viral copies this assay can detect is 250 copies / mL. A negative result does not preclude SARS-CoV-2 infection and should not be used as the sole basis for treatment or other patient management decisions.  A negative result may occur with improper specimen collection / handling, submission of specimen other than nasopharyngeal swab, presence of viral mutation(s) within the areas targeted by this assay, and inadequate number of viral copies (<250 copies / mL). A negative result must be combined with clinical observations, patient history, and epidemiological information.  Fact Sheet for Patients:   BoilerBrush.com.cy  Fact Sheet for Healthcare Providers: https://pope.com/  This test is not yet approved or  cleared by the [place] FDA and has been authorized  for detection and/or diagnosis of SARS-CoV-2 by FDA under an Emergency Use Authorization (EUA).  This EUA will remain in effect (meaning this test can be used) for the duration of the COVID-19 declaration under Section 564(b)(1) of the Act, 21 U.S.C. section 360bbb-3(b)(1), unless the authorization is terminated or revoked sooner.  Performed at [facility], [address], [place], [place] [address]   Urine Culture     Status: Abnormal   Collection Time: 06/26/20 12:37 PM   Specimen: Urine, Random  Result Value Ref Range Status   Specimen Description URINE, RANDOM  Final   Special Requests   Final    NONE Performed at [facility], [address], [place], [place] [address]    Culture MULTIPLE SPECIES PRESENT, SUGGEST RECOLLECTION (A)  Final   Report Status 06/27/2020 FINAL  Final     Labs: Basic Metabolic Panel: Recent Labs  Lab 06/25/20 1656 06/26/20  0300 06/27/20 0919 06/28/20 0319  NA 136 134* 134* 136  K 3.8 4.0 3.7 3.7  CL 101 103 104 102  CO2 25 24 26 24   GLUCOSE 134* 133* 188* 95  BUN 18 22 16 16   CREATININE 1.69* 1.50* 1.33* 1.29*  CALCIUM 8.5* 8.1* 8.3* 8.5*   Liver Function Tests: Recent Labs  Lab 06/25/20 1656  AST 16  ALT 12  ALKPHOS 79  BILITOT 0.5  PROT 6.8  ALBUMIN 2.1*   No results for input(s): LIPASE, AMYLASE in the last 168 hours. No results for input(s): AMMONIA in the last 168 hours. CBC: Recent Labs  Lab 06/25/20 1656 06/26/20 0300 06/27/20 0919 06/28/20 0319  WBC 22.5* 14.8* 10.5 11.7*  NEUTROABS 18.7*  --   --   --   HGB 7.7* 7.0* 7.0* 9.0*  HCT 24.4* 22.0* 21.9* 26.6*  MCV 85.0 84.0 84.9 82.6  PLT 253 215 259 299   Cardiac Enzymes: No results for input(s): CKTOTAL, CKMB, CKMBINDEX, TROPONINI in the last 168 hours. BNP: BNP (last 3 results) Recent Labs    06/19/20 0303 06/20/20 0341 06/21/20 0250  BNP 175.2* 244.0* 333.5*    ProBNP (last 3 results) No results for input(s): PROBNP in the last 8760  hours.  CBG: Recent Labs  Lab 06/27/20 1114 06/27/20 1608 06/27/20 2122 06/28/20 0655 06/28/20 1121  GLUCAP 129* 145* 87 98 136*       Signed:  Zannie Cove MD.  [facility] 06/28/2020, 3:59 PM

## 2020-06-28 NOTE — Care Management Important Message (Signed)
Important Message  Patient Details  Name: JONOVAN BOEDECKER MRN: [id number] Date of Birth: 03-Dec-1945   Medicare Important Message Given:  Yes Patient left Prior to IM delivery.  IM will be mailed to patient home address.     Steffen Hase 06/28/2020, 1:58 PM

## 2020-06-30 LAB — CULTURE, BLOOD (ROUTINE X 2)
Culture: NO GROWTH
Culture: NO GROWTH
Special Requests: ADEQUATE

## 2020-07-04 ENCOUNTER — Other Ambulatory Visit: Payer: Self-pay

## 2020-07-04 DIAGNOSIS — I739 Peripheral vascular disease, unspecified: Secondary | ICD-10-CM

## 2020-07-20 ENCOUNTER — Encounter: Payer: Medicare Other | Admitting: Vascular Surgery

## 2020-07-20 ENCOUNTER — Ambulatory Visit (INDEPENDENT_AMBULATORY_CARE_PROVIDER_SITE_OTHER): Payer: Medicare Other | Admitting: Vascular Surgery

## 2020-07-20 ENCOUNTER — Encounter (HOSPITAL_COMMUNITY): Payer: Medicare Other

## 2020-07-20 ENCOUNTER — Other Ambulatory Visit: Payer: Self-pay

## 2020-07-20 ENCOUNTER — Ambulatory Visit (HOSPITAL_COMMUNITY)
Admission: RE | Admit: 2020-07-20 | Discharge: 2020-07-20 | Disposition: A | Discharge status: Home / Self Care | Payer: Medicare Other | Source: Ambulatory Visit | Attending: Vascular Surgery | Admitting: Vascular Surgery

## 2020-07-20 ENCOUNTER — Encounter: Payer: Self-pay | Admitting: Vascular Surgery

## 2020-07-20 VITALS — BP 180/66 | HR 60 | Temp 97.6°F | Resp 20 | Ht 69.0 in

## 2020-07-20 DIAGNOSIS — I739 Peripheral vascular disease, unspecified: Secondary | ICD-10-CM | POA: Diagnosis not present

## 2020-07-20 DIAGNOSIS — S91302A Unspecified open wound, left foot, initial encounter: Secondary | ICD-10-CM

## 2020-07-20 NOTE — Progress Notes (Signed)
Patient ID: Jeremiah [name], male   DOB: 1946/02/28, 74 y.o.   MRN: [id number]  Reason for Consult: Post-op Follow-up   Referred by No ref. provider found  Subjective:     HPI:  Jeremiah [name] is a 74 y.o. male has a history of right above-knee amputation.  He was nearly walking with a prosthesis was found to have left heel ulceration.  He was subsequently scheduled for lower extremity angiography then underwent stenting of his left SFA at the end of July.  Since that time he has been seen by his home health nurse.  States that he does not have any fevers.  He does have moderate to severe nonradiating pain in the left heel.  This is improved with elevation.  States that the pain is worse at night.  He is confined to wheelchair at this time.  He is not on any antibiotics.  Past Medical History:  Diagnosis Date  . Amputee   . Aortic atherosclerosis (HCC)   . Arthritis    "joints; shoulders, knees, hands, back" (05/21/2018)  . Atherosclerosis of coronary artery   . C. difficile diarrhea 04/2018  . Diastolic CHF (HCC)   . Diverticulosis   . High cholesterol   . History of gout   . Hypertension   . IDA (iron deficiency anemia)    from referral Dr Darleene Cleaver  . Internal hemorrhoids   . Peripheral vascular disease (HCC)   . Pneumonia    "couple times" (05/21/2018)  . Sleep apnea    "has mask; won't use" (05/21/2018)  . Type II diabetes mellitus (HCC)    Family History  Problem Relation Age of Onset  . Diabetes Mother   . Hypertension Mother   . Heart disease Father   . Heart attack Father   . Diabetes Sister   . Hypertension Sister   . Diabetes Brother   . Heart disease Brother   . Hypertension Brother   . Heart attack Brother    Past Surgical History:  Procedure Laterality Date  . ABDOMINAL AORTOGRAM W/LOWER EXTREMITY N/A 11/01/2018   Procedure: ABDOMINAL AORTOGRAM W/LOWER EXTREMITY;  Surgeon: Runell Gess, MD;  Location: [facility] INVASIVE CV LAB;  Service: Cardiovascular;   Laterality: N/A;  . ABDOMINAL AORTOGRAM W/LOWER EXTREMITY Left 06/20/2020   Procedure: ABDOMINAL AORTOGRAM W/LOWER EXTREMITY;  Surgeon: Cephus Shelling, MD;  Location: [facility] INVASIVE CV LAB;  Service: Cardiovascular;  Laterality: Left;  . AMPUTATION Right 11/09/2018   Procedure: RIGHT - AMPUTATION ABOVE KNEE;  Surgeon: Maeola Harman, MD;  Location: [facility] OR;  Service: Vascular;  Laterality: Right;  . CATARACT EXTRACTION W/ INTRAOCULAR LENS  IMPLANT, BILATERAL Bilateral   . CHOLECYSTECTOMY  05/21/2018   ATTEMPTED LAPAROSCOPIC CHOLECYSTECTOMY, OPEN DRAINAGE OF GALLBLADDER WITH BIOPSY  . CHOLECYSTECTOMY N/A 05/21/2018   Procedure: ATTEMPTED LAPAROSCOPIC CHOLECYSTECTOMY, OPEN DRAINAGE OF GALLBLADDER WITH BIOPSY;  Surgeon: Griselda Miner, MD;  Location: [facility] OR;  Service: General;  Laterality: N/A;  . COLONOSCOPY W/ POLYPECTOMY    . COLONOSCOPY WITH PROPOFOL N/A 09/16/2018   Procedure: COLONOSCOPY WITH PROPOFOL;  Surgeon: Charlott Rakes, MD;  Location: [facility] ENDOSCOPY;  Service: Endoscopy;  Laterality: N/A;  . 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 FLEXIBLE SIGMOIDOSCOPY N/A 04/20/2020   Procedure: FLEXIBLE SIGMOIDOSCOPY;  Surgeon: Meridee Score Netty Starring., MD;  Location: [facility];  Service: Gastroenterology;  Laterality: N/A;  Fecal Disimpaction  .  IR CATHETER TUBE CHANGE  04/14/2018  . IR CHOLANGIOGRAM EXISTING TUBE  03/17/2018  . IR PERC CHOLECYSTOSTOMY  01/31/2018  . IR RADIOLOGIST EVAL & MGMT  03/02/2018  . PERIPHERAL VASCULAR INTERVENTION Left 06/20/2020   Procedure: PERIPHERAL VASCULAR INTERVENTION;  Surgeon: Cephus Shelling, MD;  Location: [facility] INVASIVE CV LAB;  Service: Cardiovascular;  Laterality: Left;  4 SFA STENTS    Short Social History:  Social History   Tobacco Use  . Smoking status: Former Smoker    Years: 24.00    Types: Cigarettes    Quit  date: 11/25/1983    Years since quitting: 36.6  . Smokeless tobacco: Never Used  Substance Use Topics  . Alcohol use: Not Currently    No Known Allergies  Current Outpatient Medications  Medication Sig Dispense Refill  . acetaminophen (TYLENOL) 500 MG tablet Take 1,000 mg by mouth every 6 (six) hours as needed for moderate pain or headache.    Marland Kitchen aspirin 81 MG tablet Take 81 mg by mouth daily.    Marland Kitchen atorvastatin (LIPITOR) 20 MG tablet Take 1 tablet (20 mg total) by mouth daily. 30 tablet 1  . brimonidine (ALPHAGAN) 0.2 % ophthalmic solution Place 1 drop into both eyes 2 (two) times daily. 5 mL 12  . clopidogrel (PLAVIX) 75 MG tablet Take 1 tablet (75 mg total) by mouth daily with breakfast. 30 tablet 12  . ferrous sulfate 325 (65 FE) MG tablet Take 1 tablet (325 mg total) by mouth 2 (two) times daily with a meal. 60 tablet 1  . gabapentin (NEURONTIN) 300 MG capsule Take 1 capsule (300 mg total) by mouth 2 (two) times daily. 60 capsule 0  . glimepiride (AMARYL) 1 MG tablet Take 1 tablet (1 mg total) by mouth daily with breakfast. 30 tablet 1  . glucose blood test strip Accu-Chek Aviva Plus test strips  Take 1 strip 3 times a day by miscell. route for 90 days.    Marland Kitchen glucose blood test strip Accu-Chek Aviva Plus test strips  USE AS DIRECTED THREE TIMES DAILY.    . hydrALAZINE (APRESOLINE) 100 MG tablet Take 0.5 tablets (50 mg total) by mouth every 8 (eight) hours.    . metoprolol succinate (TOPROL-XL) 50 MG 24 hr tablet Take 50 mg by mouth daily.     . timolol (TIMOPTIC) 0.5 % ophthalmic solution Place 1 drop into both eyes 2 (two) times daily. 10 mL 6  . traMADol (ULTRAM) 50 MG tablet Take 50 mg by mouth every 8 (eight) hours as needed for moderate pain.      No current facility-administered medications for this visit.    Review of Systems  Constitutional:  Constitutional negative. HENT: HENT negative.  Eyes: Eyes negative.  Respiratory: Respiratory negative.  Cardiovascular:  Cardiovascular negative.  GI: Gastrointestinal negative.  Musculoskeletal: Positive for leg pain.  Skin: Positive for wound.  Neurological: Neurological negative. Hematologic: Hematologic/lymphatic negative.  Psychiatric: Psychiatric negative.        Objective:  Objective   Vitals:   07/20/20 0939  BP: (!) 180/66  Pulse: 60  Resp: 20  Temp: 97.6 F (36.4 C)  SpO2: 94%  Height: 5\' 9"  (1.753 m)   Body mass index is 23.63 kg/m.  Physical Exam HENT:     Head: Normocephalic.     Nose:     Comments: Mask in place Eyes:     Pupils: Pupils are equal, round, and reactive to light.  Cardiovascular:     Comments: Popliteal pulses weakly  palpable on the left Biphasic waveforms peroneal and PT on the left Pulmonary:     Effort: Pulmonary effort is normal.  Abdominal:     General: Abdomen is flat.     Palpations: Abdomen is soft.  Musculoskeletal:        General: Swelling present. Normal range of motion.     Left lower leg: Edema present.  Skin:    General: Skin is dry.     Comments: Wound picture below  Neurological:     General: No focal deficit present.     Mental Status: He is alert.  Psychiatric:        Mood and Affect: Mood normal.        Behavior: Behavior normal.        Thought Content: Thought content normal.        Judgment: Judgment normal.          Data: I have independent interpreted his ABI on the left to be 0.87 and biphasic based on his posterior tibial artery and his toe pressure is 103.     Assessment/Plan:     74 year old male with the above-noted wound that does not appear to be healing very well.  He does have pain.  Does not have any active infection.  I discussed with him proceeding with left above-knee amputation versus continued wound care at this time he would rather continue to watch.  He will continue home health wound care we will see him back in approximately 4 weeks for further evaluation.  Should he have unbearable pain or have an  infection he understands that he will need left above-knee amputation.     Maeola Harman MD [facility]

## 2020-08-09 ENCOUNTER — Inpatient Hospital Stay (HOSPITAL_COMMUNITY): Payer: Medicare Other

## 2020-08-09 ENCOUNTER — Emergency Department (HOSPITAL_COMMUNITY): Payer: Medicare Other

## 2020-08-09 ENCOUNTER — Inpatient Hospital Stay (HOSPITAL_COMMUNITY)
Admission: EM | Admit: 2020-08-09 | Discharge: 2020-08-21 | DRG: 177 | Disposition: A | Discharge status: Home Health | Payer: Medicare Other | Attending: Family Medicine | Admitting: Family Medicine

## 2020-08-09 DIAGNOSIS — T859XXA Unspecified complication of internal prosthetic device, implant and graft, initial encounter: Secondary | ICD-10-CM

## 2020-08-09 DIAGNOSIS — I10 Essential (primary) hypertension: Secondary | ICD-10-CM | POA: Diagnosis not present

## 2020-08-09 DIAGNOSIS — I130 Hypertensive heart and chronic kidney disease with heart failure and stage 1 through stage 4 chronic kidney disease, or unspecified chronic kidney disease: Secondary | ICD-10-CM | POA: Diagnosis present

## 2020-08-09 DIAGNOSIS — Z7984 Long term (current) use of oral hypoglycemic drugs: Secondary | ICD-10-CM | POA: Diagnosis not present

## 2020-08-09 DIAGNOSIS — Z7902 Long term (current) use of antithrombotics/antiplatelets: Secondary | ICD-10-CM | POA: Diagnosis not present

## 2020-08-09 DIAGNOSIS — Z7982 Long term (current) use of aspirin: Secondary | ICD-10-CM | POA: Diagnosis not present

## 2020-08-09 DIAGNOSIS — D509 Iron deficiency anemia, unspecified: Secondary | ICD-10-CM | POA: Diagnosis present

## 2020-08-09 DIAGNOSIS — Z993 Dependence on wheelchair: Secondary | ICD-10-CM

## 2020-08-09 DIAGNOSIS — E1142 Type 2 diabetes mellitus with diabetic polyneuropathy: Secondary | ICD-10-CM | POA: Diagnosis present

## 2020-08-09 DIAGNOSIS — I739 Peripheral vascular disease, unspecified: Secondary | ICD-10-CM | POA: Diagnosis present

## 2020-08-09 DIAGNOSIS — Z9689 Presence of other specified functional implants: Secondary | ICD-10-CM | POA: Diagnosis not present

## 2020-08-09 DIAGNOSIS — Z6820 Body mass index (BMI) 20.0-20.9, adult: Secondary | ICD-10-CM

## 2020-08-09 DIAGNOSIS — E785 Hyperlipidemia, unspecified: Secondary | ICD-10-CM | POA: Diagnosis not present

## 2020-08-09 DIAGNOSIS — E860 Dehydration: Secondary | ICD-10-CM | POA: Diagnosis present

## 2020-08-09 DIAGNOSIS — Z89619 Acquired absence of unspecified leg above knee: Secondary | ICD-10-CM

## 2020-08-09 DIAGNOSIS — I5032 Chronic diastolic (congestive) heart failure: Secondary | ICD-10-CM | POA: Diagnosis present

## 2020-08-09 DIAGNOSIS — E08622 Diabetes mellitus due to underlying condition with other skin ulcer: Secondary | ICD-10-CM

## 2020-08-09 DIAGNOSIS — Z79899 Other long term (current) drug therapy: Secondary | ICD-10-CM

## 2020-08-09 DIAGNOSIS — E119 Type 2 diabetes mellitus without complications: Secondary | ICD-10-CM

## 2020-08-09 DIAGNOSIS — J690 Pneumonitis due to inhalation of food and vomit: Principal | ICD-10-CM | POA: Diagnosis present

## 2020-08-09 DIAGNOSIS — R1310 Dysphagia, unspecified: Secondary | ICD-10-CM | POA: Diagnosis not present

## 2020-08-09 DIAGNOSIS — Z87891 Personal history of nicotine dependence: Secondary | ICD-10-CM

## 2020-08-09 DIAGNOSIS — Z9119 Patient's noncompliance with other medical treatment and regimen: Secondary | ICD-10-CM

## 2020-08-09 DIAGNOSIS — U071 COVID-19: Secondary | ICD-10-CM | POA: Diagnosis present

## 2020-08-09 DIAGNOSIS — Z89611 Acquired absence of right leg above knee: Secondary | ICD-10-CM | POA: Diagnosis not present

## 2020-08-09 DIAGNOSIS — E441 Mild protein-calorie malnutrition: Secondary | ICD-10-CM | POA: Diagnosis present

## 2020-08-09 DIAGNOSIS — D72829 Elevated white blood cell count, unspecified: Secondary | ICD-10-CM | POA: Diagnosis present

## 2020-08-09 DIAGNOSIS — J90 Pleural effusion, not elsewhere classified: Secondary | ICD-10-CM | POA: Diagnosis not present

## 2020-08-09 DIAGNOSIS — L89152 Pressure ulcer of sacral region, stage 2: Secondary | ICD-10-CM | POA: Diagnosis not present

## 2020-08-09 DIAGNOSIS — R7989 Other specified abnormal findings of blood chemistry: Secondary | ICD-10-CM | POA: Diagnosis not present

## 2020-08-09 DIAGNOSIS — G9341 Metabolic encephalopathy: Secondary | ICD-10-CM | POA: Diagnosis present

## 2020-08-09 DIAGNOSIS — J1282 Pneumonia due to coronavirus disease 2019: Secondary | ICD-10-CM | POA: Diagnosis present

## 2020-08-09 DIAGNOSIS — R4182 Altered mental status, unspecified: Secondary | ICD-10-CM

## 2020-08-09 DIAGNOSIS — L97429 Non-pressure chronic ulcer of left heel and midfoot with unspecified severity: Secondary | ICD-10-CM | POA: Diagnosis present

## 2020-08-09 DIAGNOSIS — E7800 Pure hypercholesterolemia, unspecified: Secondary | ICD-10-CM | POA: Diagnosis present

## 2020-08-09 DIAGNOSIS — E1122 Type 2 diabetes mellitus with diabetic chronic kidney disease: Secondary | ICD-10-CM | POA: Diagnosis present

## 2020-08-09 DIAGNOSIS — E538 Deficiency of other specified B group vitamins: Secondary | ICD-10-CM | POA: Diagnosis present

## 2020-08-09 DIAGNOSIS — J9601 Acute respiratory failure with hypoxia: Secondary | ICD-10-CM | POA: Diagnosis present

## 2020-08-09 DIAGNOSIS — E11621 Type 2 diabetes mellitus with foot ulcer: Secondary | ICD-10-CM | POA: Diagnosis present

## 2020-08-09 DIAGNOSIS — E876 Hypokalemia: Secondary | ICD-10-CM | POA: Diagnosis present

## 2020-08-09 DIAGNOSIS — L97309 Non-pressure chronic ulcer of unspecified ankle with unspecified severity: Secondary | ICD-10-CM

## 2020-08-09 DIAGNOSIS — E1151 Type 2 diabetes mellitus with diabetic peripheral angiopathy without gangrene: Secondary | ICD-10-CM | POA: Diagnosis present

## 2020-08-09 DIAGNOSIS — N1831 Chronic kidney disease, stage 3a: Secondary | ICD-10-CM | POA: Diagnosis present

## 2020-08-09 LAB — COMPREHENSIVE METABOLIC PANEL
ALT: 17 U/L (ref 0–44)
AST: 18 U/L (ref 15–41)
Albumin: 2.6 g/dL — ABNORMAL LOW (ref 3.5–5.0)
Alkaline Phosphatase: 91 U/L (ref 38–126)
Anion gap: 10 (ref 5–15)
BUN: 13 mg/dL (ref 8–23)
CO2: 26 mmol/L (ref 22–32)
Calcium: 8.9 mg/dL (ref 8.9–10.3)
Chloride: 97 mmol/L — ABNORMAL LOW (ref 98–111)
Creatinine, Ser: 1.44 mg/dL — ABNORMAL HIGH (ref 0.61–1.24)
GFR calc Af Amer: 54 mL/min — ABNORMAL LOW (ref >60)
GFR calc non Af Amer: 47 mL/min — ABNORMAL LOW (ref >60)
Glucose, Bld: 150 mg/dL — ABNORMAL HIGH (ref 70–99)
Potassium: 4.4 mmol/L (ref 3.5–5.1)
Sodium: 133 mmol/L — ABNORMAL LOW (ref 135–145)
Total Bilirubin: 0.4 mg/dL (ref 0.3–1.2)
Total Protein: 7.8 g/dL (ref 6.5–8.1)

## 2020-08-09 LAB — C-REACTIVE PROTEIN: CRP: 9.6 mg/dL — ABNORMAL HIGH (ref <1.0)

## 2020-08-09 LAB — CBC WITH DIFFERENTIAL/PLATELET
Abs Immature Granulocytes: 0.07 10*3/uL (ref 0.00–0.07)
Basophils Absolute: 0.1 10*3/uL (ref 0.0–0.1)
Basophils Relative: 0 %
Eosinophils Absolute: 1.2 10*3/uL — ABNORMAL HIGH (ref 0.0–0.5)
Eosinophils Relative: 7 %
HCT: 30.5 % — ABNORMAL LOW (ref 39.0–52.0)
Hemoglobin: 9.6 g/dL — ABNORMAL LOW (ref 13.0–17.0)
Immature Granulocytes: 0 %
Lymphocytes Relative: 7 %
Lymphs Abs: 1.4 10*3/uL (ref 0.7–4.0)
MCH: 27 pg (ref 26.0–34.0)
MCHC: 31.5 g/dL (ref 30.0–36.0)
MCV: 85.7 fL (ref 80.0–100.0)
Monocytes Absolute: 1.6 10*3/uL — ABNORMAL HIGH (ref 0.1–1.0)
Monocytes Relative: 9 %
Neutro Abs: 13.9 10*3/uL — ABNORMAL HIGH (ref 1.7–7.7)
Neutrophils Relative %: 77 %
Platelets: 231 10*3/uL (ref 150–400)
RBC: 3.56 MIL/uL — ABNORMAL LOW (ref 4.22–5.81)
RDW: 13.6 % (ref 11.5–15.5)
WBC: 18.2 10*3/uL — ABNORMAL HIGH (ref 4.0–10.5)
nRBC: 0 % (ref 0.0–0.2)

## 2020-08-09 LAB — URINALYSIS, ROUTINE W REFLEX MICROSCOPIC
Bacteria, UA: NONE SEEN
Bilirubin Urine: NEGATIVE
Glucose, UA: NEGATIVE mg/dL
Ketones, ur: NEGATIVE mg/dL
Leukocytes,Ua: NEGATIVE
Nitrite: NEGATIVE
Protein, ur: >=300 mg/dL — AB
Specific Gravity, Urine: 1.011 (ref 1.005–1.030)
pH: 7 (ref 5.0–8.0)

## 2020-08-09 LAB — FERRITIN: Ferritin: 227 ng/mL (ref 24–336)

## 2020-08-09 LAB — PROTIME-INR
INR: 1.2 (ref 0.8–1.2)
Prothrombin Time: 14.3 seconds (ref 11.4–15.2)

## 2020-08-09 LAB — SARS CORONAVIRUS 2 BY RT PCR (HOSPITAL ORDER, PERFORMED IN ~~LOC~~ HOSPITAL LAB): SARS Coronavirus 2: POSITIVE — AB

## 2020-08-09 LAB — LACTATE DEHYDROGENASE: LDH: 171 U/L (ref 98–192)

## 2020-08-09 LAB — LACTIC ACID, PLASMA
Lactic Acid, Venous: 1.3 mmol/L (ref 0.5–1.9)
Lactic Acid, Venous: 0.9 mmol/L (ref 0.5–1.9)

## 2020-08-09 LAB — TRIGLYCERIDES: Triglycerides: 115 mg/dL (ref <150)

## 2020-08-09 LAB — APTT: aPTT: 28 seconds (ref 24–36)

## 2020-08-09 LAB — FIBRINOGEN: Fibrinogen: 654 mg/dL — ABNORMAL HIGH (ref 210–475)

## 2020-08-09 LAB — D-DIMER, QUANTITATIVE: D-Dimer, Quant: 3.3 ug/mL-FEU — ABNORMAL HIGH (ref 0.00–0.50)

## 2020-08-09 MED ORDER — INSULIN ASPART 100 UNIT/ML ~~LOC~~ SOLN
0.0000 [IU] | SUBCUTANEOUS | Status: DC
  Administered 2020-08-10: 2 [IU] via SUBCUTANEOUS
  Administered 2020-08-10: 3 [IU] via SUBCUTANEOUS
  Administered 2020-08-10 – 2020-08-11 (×3): 2 [IU] via SUBCUTANEOUS
  Administered 2020-08-11: 1 [IU] via SUBCUTANEOUS
  Administered 2020-08-11 (×2): 2 [IU] via SUBCUTANEOUS
  Administered 2020-08-12: 5 [IU] via SUBCUTANEOUS
  Administered 2020-08-12: 2 [IU] via SUBCUTANEOUS
  Administered 2020-08-12: 3 [IU] via SUBCUTANEOUS
  Administered 2020-08-12 – 2020-08-13 (×2): 2 [IU] via SUBCUTANEOUS
  Administered 2020-08-13: 1 [IU] via SUBCUTANEOUS
  Administered 2020-08-13: 2 [IU] via SUBCUTANEOUS
  Administered 2020-08-13 (×2): 1 [IU] via SUBCUTANEOUS
  Administered 2020-08-14: 2 [IU] via SUBCUTANEOUS
  Administered 2020-08-14: 1 [IU] via SUBCUTANEOUS
  Administered 2020-08-14: 3 [IU] via SUBCUTANEOUS
  Administered 2020-08-15 (×4): 1 [IU] via SUBCUTANEOUS
  Administered 2020-08-16: 3 [IU] via SUBCUTANEOUS

## 2020-08-09 MED ORDER — GUAIFENESIN-DM 100-10 MG/5ML PO SYRP
10.0000 mL | ORAL_SOLUTION | ORAL | Status: DC | PRN
  Administered 2020-08-10 – 2020-08-16 (×3): 10 mL via ORAL
  Filled 2020-08-09 (×3): qty 10

## 2020-08-09 MED ORDER — DEXAMETHASONE SODIUM PHOSPHATE 10 MG/ML IJ SOLN
10.0000 mg | Freq: Once | INTRAMUSCULAR | Status: AC
  Administered 2020-08-09: 10 mg via INTRAVENOUS
  Filled 2020-08-09: qty 1

## 2020-08-09 MED ORDER — ACETAMINOPHEN 325 MG PO TABS
650.0000 mg | ORAL_TABLET | Freq: Four times a day (QID) | ORAL | Status: DC | PRN
  Administered 2020-08-16: 650 mg via ORAL
  Filled 2020-08-09: qty 2

## 2020-08-09 MED ORDER — SODIUM CHLORIDE 0.9 % IV SOLN
500.0000 mg | Freq: Once | INTRAVENOUS | Status: AC
  Administered 2020-08-09: 500 mg via INTRAVENOUS
  Filled 2020-08-09: qty 500

## 2020-08-09 MED ORDER — SODIUM CHLORIDE 0.9 % IV SOLN: 2.0000 g | INTRAVENOUS | Status: DC

## 2020-08-09 MED ORDER — SODIUM CHLORIDE 0.9 % IV SOLN
500.0000 mg | INTRAVENOUS | Status: DC
  Administered 2020-08-10 – 2020-08-11 (×2): 500 mg via INTRAVENOUS
  Filled 2020-08-09 (×3): qty 500

## 2020-08-09 MED ORDER — HYDROCODONE-ACETAMINOPHEN 5-325 MG PO TABS: 1.0000 | ORAL_TABLET | ORAL | Status: DC | PRN

## 2020-08-09 MED ORDER — ONDANSETRON HCL 4 MG PO TABS: 4.0000 mg | ORAL_TABLET | Freq: Four times a day (QID) | ORAL | Status: DC | PRN

## 2020-08-09 MED ORDER — SODIUM CHLORIDE 0.9 % IV SOLN
1.0000 g | Freq: Once | INTRAVENOUS | Status: AC
  Administered 2020-08-09: 1 g via INTRAVENOUS
  Filled 2020-08-09: qty 10

## 2020-08-09 MED ORDER — ENOXAPARIN SODIUM 40 MG/0.4ML ~~LOC~~ SOLN
40.0000 mg | SUBCUTANEOUS | Status: DC
  Administered 2020-08-10 – 2020-08-14 (×5): 40 mg via SUBCUTANEOUS
  Filled 2020-08-09 (×5): qty 0.4

## 2020-08-09 MED ORDER — ASCORBIC ACID 500 MG PO TABS
500.0000 mg | ORAL_TABLET | Freq: Every day | ORAL | Status: DC
  Administered 2020-08-10 – 2020-08-21 (×12): 500 mg via ORAL
  Filled 2020-08-09 (×12): qty 1

## 2020-08-09 MED ORDER — SODIUM CHLORIDE 0.9 % IV SOLN: 250.0000 mL | INTRAVENOUS | Status: DC | PRN

## 2020-08-09 MED ORDER — SODIUM CHLORIDE 0.9% FLUSH
3.0000 mL | Freq: Two times a day (BID) | INTRAVENOUS | Status: DC
  Administered 2020-08-09: 3 mL via INTRAVENOUS

## 2020-08-09 MED ORDER — SODIUM CHLORIDE 0.9 % IV SOLN
100.0000 mg | Freq: Every day | INTRAVENOUS | Status: AC
  Administered 2020-08-11 – 2020-08-14 (×4): 100 mg via INTRAVENOUS
  Filled 2020-08-09 (×4): qty 20

## 2020-08-09 MED ORDER — ONDANSETRON HCL 4 MG/2ML IJ SOLN: 4.0000 mg | Freq: Four times a day (QID) | INTRAMUSCULAR | Status: DC | PRN

## 2020-08-09 MED ORDER — ZINC SULFATE 220 (50 ZN) MG PO CAPS
220.0000 mg | ORAL_CAPSULE | Freq: Every day | ORAL | Status: DC
  Administered 2020-08-10 – 2020-08-21 (×12): 220 mg via ORAL
  Filled 2020-08-09 (×12): qty 1

## 2020-08-09 MED ORDER — SODIUM CHLORIDE 0.9% FLUSH
3.0000 mL | Freq: Two times a day (BID) | INTRAVENOUS | Status: DC
  Administered 2020-08-09 – 2020-08-21 (×20): 3 mL via INTRAVENOUS

## 2020-08-09 MED ORDER — SODIUM CHLORIDE 0.9% FLUSH: 3.0000 mL | INTRAVENOUS | Status: DC | PRN

## 2020-08-09 MED ORDER — DEXAMETHASONE SODIUM PHOSPHATE 10 MG/ML IJ SOLN: 6.0000 mg | INTRAMUSCULAR | Status: DC

## 2020-08-09 MED ORDER — SODIUM CHLORIDE 0.9 % IV SOLN
200.0000 mg | Freq: Once | INTRAVENOUS | Status: AC
  Administered 2020-08-10: 200 mg via INTRAVENOUS
  Filled 2020-08-09: qty 40

## 2020-08-09 MED ORDER — ACETAMINOPHEN 500 MG PO TABS
1000.0000 mg | ORAL_TABLET | Freq: Once | ORAL | Status: AC
  Administered 2020-08-09: 1000 mg via ORAL
  Filled 2020-08-09: qty 2

## 2020-08-09 NOTE — ED Triage Notes (Signed)
Pt arrives via EMS from home with complaints of AMS, diarrhea X1 week and cough with productive sputum.  Pt SpO2 with EMS 85% Alert and orientedX3  116/49 100.9 T CBG 161 RR22

## 2020-08-09 NOTE — H&P (Signed)
Jeremiah [name] DPO:[id number] DOB: 1946-01-23 DOA: 08/09/2020     PCP: Clovia Cuff, MD   Outpatient Specialists:     GI  Dr.  Rush Landmark Vascular surgery Dr. Carlis Abbott Patient arrived to ER on 08/09/20 at 1858 Referred by Attending Isla Pence, MD   Patient coming from: home Lives   With family    Chief Complaint:   Chief Complaint  Patient presents with  . Code Sepsis    HPI: Jeremiah [name] is a 74 y.o. male with medical history significant of PVD  Sp Left SFA stent, HTN, iron deficiency anemia, [name], [name], chronic diastolic CHF, aortic stenosis, [name], [name], [name]    Presented with altered mental status diarrhea for 1 week cough and productive sputum EMS was called he was noted to be hypoxic down to 85% He has wound care nurse who has changed his wound seems to be doing well Per family this was a sudden change for patient he was doing well 2 days prior Patient states he always cough with food but no hx of Dysphagia documented Patient was admitted in August 2021 for UTI versus pneumonia acute respiratory failure and hypoxia.  He has chronic small right pleural effusion. Treated with IV antibiotics IV ceftriaxone and azithromycin Was able to be discharged on 5 August to home with PT OT Of note patient has history of iron deficiency requiring blood transfusion during last admission as well as IV iron   he has history of significant peripheral arterial disease for which she is followed by vascular surgery.  He has left recent left foot ischemia of the left heel is well his wound.  Requirement left SFA and above-knee popliteal artery angioplasty and stent placement in July 2021.  In the past patient have had right AKA   Infectious risk factors:  Reports fever, shortness of breath, dry cough,  N/V/Diarrhea/abdominal pain,  Body aches, severe fatigue   Has   been vaccinated against COVID Februry   Initial COVID TEST     POSITIVE,     Lab Results  Component Value Date   [place] (A) 08/09/2020   [place] NEGATIVE 06/25/2020   [place] NEGATIVE 06/18/2020   [place] NEGATIVE 04/18/2020   Regarding pertinent Chronic problems:    Peripheral vascular disease on aspirin Plavix and statin followed by Dr. Carlis Abbott Was seen in the office by vascular surgery on 27 August at that time he was given a choice of left BKA versus continued duration and chose to continue observation for now    Hyperlipidemia -   on statins Lipitor Lipid Panel     Component Value Date/Time   CHOL 100 11/04/2018 0418   TRIG 91 11/04/2018 0418   HDL 27 (L) 11/04/2018 0418   CHOLHDL 3.7 11/04/2018 0418   VLDL 18 11/04/2018 0418   LDLCALC 55 11/04/2018 0418    Iron deficiency anemia on iron supplements   HTN on metoprolol hydralazine   chronic CHF diastolic  - last echo July 2021 showed grade 1 diastolic dysfunction EF 43-15 On Lasix      [name] -  Lab Results  Component Value Date   HGBA1C 6.1 (H) 06/18/2020   on   PO meds only,     OSA - noncompliant with CPAP    [name] - baseline Cr 1.3     Lab Results  Component Value Date   CREATININE 1.44 (H) 08/09/2020   CREATININE 1.29 (H)  06/28/2020   CREATININE 1.33 (H) 06/27/2020     Chronic anemia - baseline hg Hemoglobin & Hematocrit  Recent Labs    06/27/20 0919 06/28/20 0319 08/09/20 1944  HGB 7.0* 9.0* 9.6*    While in ER: Tested positive for Covid chest x-ray showing worsening right-sided right lower lobe infiltrate Noted to be choking upon pills while in the emergency department possibility of aspiration pneumonia    Hospitalist was called for admission for Evidence of pneumonia and Covid positive status  The following Work up has been ordered so far:  Orders Placed This Encounter  Procedures  . Blood culture (routine single)  . Urine culture  . SARS Coronavirus 2 by RT PCR (hospital order, performed in [facility] hospital  lab) Nasopharyngeal Nasopharyngeal Swab  . DG Chest Port 1 View  . Lactic acid, plasma  . Comprehensive metabolic panel  . CBC WITH DIFFERENTIAL  . Protime-INR  . APTT  . Urinalysis, Routine w reflex microscopic  . D-dimer, quantitative  . Procalcitonin  . Lactate dehydrogenase  . Ferritin  . Triglycerides  . Fibrinogen  . C-reactive protein  . Diet NPO time specified  . Cardiac monitoring  . Document height and weight  . Assess and Document Glasgow Coma Scale  . Document vital signs within 1-hour of fluid bolus completion.  Notify provider of abnormal vital signs despite fluid resuscitation.  . Refer to Sidebar Report: Sepsis Bundle ED/IP  . Notify provider for difficulties obtaining IV access  . Initiate Carrier Fluid Protocol  . Check Rectal Temperature  . In and Out Cath  . Place surgical mask on patient  . Patient to wear surgical mask during transportation  . Assess patient for ability to self-prone. If able (can move self in bed, ambulate) and stable (SpO2 and oxygen requirement):  . RN/NT - Document specific oxygen requirements in CHL  . Notify EDP if new oxygen requirements escalates > 4L per minute [place]  . RN to draw the following extra tubes:  . pharmacy consult  . Consult for [place] Admission  ALL PATIENTS BEING ADMITTED/HAVING PROCEDURES NEED COVID-19 SCREENING  . Airborne and Contact precautions  . Pulse oximetry, continuous  . ED EKG 12-Lead  . EKG 12-Lead  . Insert peripheral IV X 1    Following Medications were ordered in ER: Medications  acetaminophen (TYLENOL) tablet 1,000 mg (has no administration in time range)  dexamethasone (DECADRON) injection 10 mg (has no administration in time range)  cefTRIAXone (ROCEPHIN) 1 g in sodium chloride 0.9 % 100 mL IVPB (0 g Intravenous Stopped 08/09/20 2139)  azithromycin (ZITHROMAX) 500 mg in sodium chloride 0.9 % 250 mL IVPB (0 mg Intravenous Stopped 08/09/20 2140)   Significant initial   Findings: Abnormal Labs Reviewed  SARS CORONAVIRUS 2 BY RT PCR ([facility]) - Abnormal; Notable for the following components:      Result Value   SARS Coronavirus 2 POSITIVE (*)    All other components within normal limits  COMPREHENSIVE METABOLIC PANEL - Abnormal; Notable for the following components:   Sodium 133 (*)    Chloride 97 (*)    Glucose, Bld 150 (*)    Creatinine, Ser 1.44 (*)    Albumin 2.6 (*)    GFR calc non Af Amer 47 (*)    GFR calc Af Amer 54 (*)    All other components within normal limits  CBC WITH DIFFERENTIAL/PLATELET - Abnormal; Notable for the following components:  WBC 18.2 (*)    RBC 3.56 (*)    Hemoglobin 9.6 (*)    HCT 30.5 (*)    Neutro Abs 13.9 (*)    Monocytes Absolute 1.6 (*)    Eosinophils Absolute 1.2 (*)    All other components within normal limits    Otherwise labs showing:    Recent Labs  Lab 08/09/20 1944  NA 133*  K 4.4  CO2 26  GLUCOSE 150*  BUN 13  CREATININE 1.44*  CALCIUM 8.9    Cr    Up from baseline see below Lab Results  Component Value Date   CREATININE 1.44 (H) 08/09/2020   CREATININE 1.29 (H) 06/28/2020   CREATININE 1.33 (H) 06/27/2020    Recent Labs  Lab 08/09/20 1944  AST 18  ALT 17  ALKPHOS 91  BILITOT 0.4  PROT 7.8  ALBUMIN 2.6*   Lab Results  Component Value Date   CALCIUM 8.9 08/09/2020   PHOS 2.5 07/08/2018      WBC       Component Value Date/Time   WBC 18.2 (H) 08/09/2020 1944   LYMPHSABS 1.4 08/09/2020 1944   LYMPHSABS 3.2 (H) 10/26/2018 1215   MONOABS 1.6 (H) 08/09/2020 1944   EOSABS 1.2 (H) 08/09/2020 1944   EOSABS 1.8 (H) 10/26/2018 1215   BASOSABS 0.1 08/09/2020 1944   BASOSABS 0.1 10/26/2018 1215    Plt: Lab Results  Component Value Date   PLT 231 08/09/2020    Lactic Acid, Venous    Component Value Date/Time   LATICACIDVEN 0.9 08/09/2020 2101   Procalcitonin   Ordered   COVID-19 Labs  Recent Labs    08/09/20 2156   DDIMER 3.30*  FERRITIN 227  LDH 171  CRP 9.6*    Lab Results  Component Value Date   SARSCOV2NAA POSITIVE (A) 08/09/2020   SARSCOV2NAA NEGATIVE 06/25/2020   SARSCOV2NAA NEGATIVE 06/18/2020   SARSCOV2NAA NEGATIVE 04/18/2020    Venous  Blood Gas ordered    HG/HCT Up      Component Value Date/Time   HGB 9.6 (L) 08/09/2020 1944   HGB 8.9 (L) 10/26/2018 1215   HCT 30.5 (L) 08/09/2020 1944   HCT 27.2 (L) 10/26/2018 1215   MCV 85.7 08/09/2020 1944   MCV 80 10/26/2018 1215    Troponin ordered     ECG: Ordered Personally reviewed by me showing: HR : 62  Rhythm:  NSR,   no evidence of ischemic changes QTC 449   BNP (last 3 results) Recent Labs    06/19/20 0303 06/20/20 0341 06/21/20 0250  BNP 175.2* 244.0* 333.5*      DM  labs:  HbA1C: Recent Labs    06/18/20 1559  HGBA1C 6.1*       UA no evidence of UTI    Urine analysis:    Component Value Date/Time   COLORURINE YELLOW 08/09/2020 2226   APPEARANCEUR CLEAR 08/09/2020 2226   LABSPEC 1.011 08/09/2020 2226   PHURINE 7.0 08/09/2020 2226   GLUCOSEU NEGATIVE 08/09/2020 2226   HGBUR SMALL (A) 08/09/2020 2226   BILIRUBINUR NEGATIVE 08/09/2020 2226   KETONESUR NEGATIVE 08/09/2020 2226   PROTEINUR >=300 (A) 08/09/2020 2226   UROBILINOGEN 0.2 01/09/2009 1532   NITRITE NEGATIVE 08/09/2020 2226   LEUKOCYTESUR NEGATIVE 08/09/2020 2226       Ordered  CT HEAD  NON acute  CXR - increase in RLL opacity   foot film - no osteo  CTA chest - ordered     ED Triage Vitals  Enc Vitals Group     BP 08/09/20 1900 (!) 169/99     Pulse Rate 08/09/20 1900 78     Resp 08/09/20 1900 (!) 22     Temp 08/09/20 1902 99 F (37.2 C)     Temp Source 08/09/20 1902 Oral     SpO2 08/09/20 1900 96 %     Weight --      Height --      Head Circumference --      Peak Flow --      Pain Score 08/09/20 1902 0     Pain Loc --      Pain Edu? --      Excl. in [place]? --   TMAX(24)@       Latest  Blood pressure (!) 138/44, pulse  (!) 124, temperature (!) 101.5 F (38.6 C), temperature source Rectal, resp. rate 16, SpO2 100 %.      Review of Systems:    Pertinent positives include:    Fevers, chills, fatigue,  shortness of breath at rest confusion diarrhea Constitutional:  No weight loss, night sweats,weight loss  HEENT:  No headaches, Difficulty swallowing,Tooth/dental problems,Sore throat,  No sneezing, itching, ear ache, nasal congestion, post nasal drip,  Cardio-vascular:  No chest pain, Orthopnea, PND, anasarca, dizziness, palpitations.no Bilateral lower extremity swelling  GI:  No heartburn, indigestion, abdominal pain, nausea, vomiting, , change in bowel habits, loss of appetite, melena, blood in stool, hematemesis Resp:  no . No dyspnea on exertion, No excess mucus, no productive cough, No non-productive cough, No coughing up of blood.No change in color of mucus.No wheezing. Skin:  no rash or lesions. No jaundice GU:  no dysuria, change in color of urine, no urgency or frequency. No straining to urinate.  No flank pain.  Musculoskeletal:  No joint pain or no joint swelling. No decreased range of motion. No back pain.  Psych:  No change in mood or affect. No depression or anxiety. No memory loss.  Neuro: no localizing neurological complaints, no tingling, no weakness, no double vision, no gait abnormality, no slurred speech, no   All systems reviewed and apart from [place] all are negative  Past Medical History:   Past Medical History:  Diagnosis Date  . Amputee   . Aortic atherosclerosis ([place])   . Arthritis    "joints; shoulders, knees, hands, back" (05/21/2018)  . Atherosclerosis of coronary artery   . C. difficile diarrhea 04/2018  . Diastolic CHF ([place])   . Diverticulosis   . High cholesterol   . History of gout   . Hypertension   . IDA (iron deficiency anemia)    from referral Dr Daphene Jaeger  . Internal hemorrhoids   . Peripheral vascular disease ([place])   . Pneumonia    "couple times"  (05/21/2018)  . Sleep apnea    "has mask; won't use" (05/21/2018)  . Type II diabetes mellitus ([place])      Past Surgical History:  Procedure Laterality Date  . ABDOMINAL AORTOGRAM W/LOWER EXTREMITY N/A 11/01/2018   Procedure: ABDOMINAL AORTOGRAM W/LOWER EXTREMITY;  Surgeon: Lorretta Harp, MD;  Location: [facility];  Service: Cardiovascular;  Laterality: N/A;  . ABDOMINAL AORTOGRAM W/LOWER EXTREMITY Left 06/20/2020   Procedure: ABDOMINAL AORTOGRAM W/LOWER EXTREMITY;  Surgeon: Marty Heck, MD;  Location: [facility];  Service: Cardiovascular;  Laterality: Left;  . AMPUTATION Right 11/09/2018   Procedure: RIGHT - AMPUTATION ABOVE KNEE;  Surgeon: Waynetta Sandy, MD;  Location: [facility];  Service: Vascular;  Laterality: Right;  . CATARACT EXTRACTION W/ INTRAOCULAR LENS  IMPLANT, BILATERAL Bilateral   . CHOLECYSTECTOMY  05/21/2018   ATTEMPTED LAPAROSCOPIC CHOLECYSTECTOMY, OPEN DRAINAGE OF GALLBLADDER WITH BIOPSY  . CHOLECYSTECTOMY N/A 05/21/2018   Procedure: ATTEMPTED LAPAROSCOPIC CHOLECYSTECTOMY, OPEN DRAINAGE OF GALLBLADDER WITH BIOPSY;  Surgeon: Jovita Kussmaul, MD;  Location: [facility];  Service: General;  Laterality: N/A;  . COLONOSCOPY W/ POLYPECTOMY    . COLONOSCOPY WITH PROPOFOL N/A 09/16/2018   Procedure: COLONOSCOPY WITH PROPOFOL;  Surgeon: Wilford Corner, MD;  Location: [facility];  Service: Endoscopy;  Laterality: N/A;  . FECAL TRANSPLANT N/A 09/16/2018   Procedure: FECAL TRANSPLANT;  Surgeon: Wilford Corner, MD;  Location: [facility];  Service: Endoscopy;  Laterality: N/A;  . FLEXIBLE SIGMOIDOSCOPY N/A 04/20/2020   Procedure: FLEXIBLE SIGMOIDOSCOPY;  Surgeon: Rush Landmark Telford Nab., MD;  Location: [facility];  Service: Gastroenterology;  Laterality: N/A;  Fecal Disimpaction  . IR CATHETER TUBE CHANGE  04/14/2018  . IR CHOLANGIOGRAM EXISTING TUBE  03/17/2018  . IR PERC CHOLECYSTOSTOMY  01/31/2018  . IR RADIOLOGIST EVAL & MGMT  03/02/2018  .  PERIPHERAL VASCULAR INTERVENTION Left 06/20/2020   Procedure: PERIPHERAL VASCULAR INTERVENTION;  Surgeon: Marty Heck, MD;  Location: [facility];  Service: Cardiovascular;  Laterality: Left;  4 SFA STENTS  Social History:  Ambulatory wheelchair bound     reports that he quit smoking about 36 years ago. His smoking use included cigarettes. He quit after 24.00 years of use. He has never used smokeless tobacco. He reports previous alcohol use. He reports that he does not use drugs.   Family History:   Family History  Problem Relation Age of Onset  . Diabetes Mother   . Hypertension Mother   . Heart disease Father   . Heart attack Father   . Diabetes Sister   . Hypertension Sister   . Diabetes Brother   . Heart disease Brother   . Hypertension Brother   . Heart attack Brother     Allergies: No Known Allergies   Prior to Admission medications   Medication Sig Start Date End Date Taking? Authorizing Provider  acetaminophen (TYLENOL) 500 MG tablet Take 1,000 mg by mouth every 6 (six) hours as needed for moderate pain or headache.    [provider]  aspirin 81 MG tablet Take 81 mg by mouth daily.    [provider]  atorvastatin (LIPITOR) 20 MG tablet Take 1 tablet (20 mg total) by mouth daily. 12/06/18   Angiulli, Lavon Paganini, PA-C  brimonidine (ALPHAGAN) 0.2 % ophthalmic solution Place 1 drop into both eyes 2 (two) times daily. 12/06/18   Angiulli, Lavon Paganini, PA-C  clopidogrel (PLAVIX) 75 MG tablet Take 1 tablet (75 mg total) by mouth daily with breakfast. 06/22/20   Nita Sells, MD  ferrous sulfate 325 (65 FE) MG tablet Take 1 tablet (325 mg total) by mouth 2 (two) times daily with a meal. 06/28/20   Domenic Polite, MD  gabapentin (NEURONTIN) 300 MG capsule Take 1 capsule (300 mg total) by mouth 2 (two) times daily. 06/28/20   Domenic Polite, MD  glimepiride (AMARYL) 1 MG tablet Take 1 tablet (1 mg total) by mouth daily with breakfast. 12/06/18    Angiulli, Lavon Paganini, PA-C  glucose blood test strip Accu-Chek Aviva Plus test strips  Take 1 strip 3 times a day by miscell. route for 90 days.    [provider]  glucose blood test strip Accu-Chek Aviva Plus test strips  USE AS DIRECTED THREE TIMES DAILY.    [provider]  hydrALAZINE (APRESOLINE) 100 MG tablet Take 0.5 tablets (50 mg total) by mouth every 8 (eight) hours. 06/28/20   Domenic Polite, MD  metoprolol succinate (TOPROL-XL) 50 MG 24 hr tablet Take 50 mg by mouth daily.  03/18/19   [provider]  timolol (TIMOPTIC) 0.5 % ophthalmic solution Place 1 drop into both eyes 2 (two) times daily. 12/06/18   Angiulli, Lavon Paganini, PA-C  traMADol (ULTRAM) 50 MG tablet Take 50 mg by mouth every 8 (eight) hours as needed for moderate pain.     [provider]   Physical Exam: Vitals with BMI 08/09/2020 08/09/2020 08/09/2020  Height - - -  Weight - - -  BMI - - -  Systolic - 101 751  Diastolic - 44 47  Pulse 025 61 63     1. General:  in No  Acute distress   Chronically ill  -appearing 2. Psychological somnolent   not Oriented 3. Head/ENT:     Dry Mucous Membranes                          Head Non traumatic, neck supple                           Poor Dentition 4. SKIN:  decreased Skin turgor,  Skin clean Dry  Right AKA, left heel wound Dry black eschar 5. Heart: Regular rate and rhythm systolic Murmur, no Rub or gallop 6. Lungs: diminished on the right  no wheezes or crackles   7. Abdomen: Soft,  non-tender, Non distended  bowel sounds present 8. Lower extremities: no clubbing, cyanosis, no  Edema, sp Right AKA 9. Neurologically Grossly intact, moving all 4 extremities equally , [name].  Equal grips bilaterally but diminished effort throughout 10. MSK: Normal range of motion   All other LABS:     Recent Labs  Lab 08/09/20 1944  WBC 18.2*  NEUTROABS 13.9*  HGB 9.6*  HCT 30.5*  MCV 85.7  PLT 231     Recent Labs  Lab  08/09/20 1944  NA 133*  K 4.4  CL 97*  CO2 26  GLUCOSE 150*  BUN 13  CREATININE 1.44*  CALCIUM 8.9     Recent Labs  Lab 08/09/20 1944  AST 18  ALT 17  ALKPHOS 91  BILITOT 0.4  PROT 7.8  ALBUMIN 2.6*       Cultures:    Component Value Date/Time   SDES URINE, RANDOM 06/26/2020 1237   SPECREQUEST  06/26/2020 1237    NONE Performed at [facility], [place] [address], [place], [place] [address]    CULT MULTIPLE SPECIES PRESENT, SUGGEST RECOLLECTION (A) 06/26/2020 1237   REPTSTATUS 06/27/2020 FINAL 06/26/2020 1237     Radiological Exams on Admission: DG Chest Port 1 View  Result Date: 08/09/2020 CLINICAL DATA:  Questionable sepsis EXAM: PORTABLE CHEST 1 VIEW COMPARISON:  Chest x-ray 06/25/2020 FINDINGS: The heart size and mediastinal contours are unchanged. Aortic arch calcifications. Interval development of hazy airspace opacity within the right lower lobe. No pulmonary edema. Similar-appearing at least small to moderate volume right pleural effusion. No left pleural effusion. No pneumothorax. No acute osseous abnormality. IMPRESSION: Stable small to moderate right pleural effusion with interval increase in right lower lobe opacity that may represent inflammation/infection versus passive atelectasis. Electronically Signed   By:  Iven Finn M.D.   On: 08/09/2020 20:14    Chart has been reviewed   Assessment/Plan  74 y.o. male with medical history significant of PVD  Sp Left SFA stent, HTN, iron deficiency anemia, [name], [name], chronic diastolic CHF, aortic stenosis, [name], [name], [name] Admitted for CVOID infection, pneumonia  Present on Admission: . Pneumonia due to COVID-19 virus -    ER Novel Corona Virus testing: Ordered 08/09/20 and is positive  Immunization status: fully vaccinated   Following concerning LAB/ imaging findings:     ANC/ALC ratio>3.5 BMP: increased BUN/Cr    CRP, LDH: increased   IL-6 and  Ferritin increased     CXR:   abnormal         -Following work-up initiated:      sputum cultures  Ordered 08/09/20, Blood cultures  Ordered 08/09/20,   CTA ordered   Following complications noted:      Diarrhea , decreased PO intake resulting in dehydration - will rehydrate  acute respiratory failure with hypoxia - continue oxygen treatment  Plan of treatment: Admit on Airborn Precautions  -given severity of illness initiate steroids Decadron $RemoveBeforeDEI'6mg'[id number]$  q 24 hours And pharmacy consult for remdesivir   - Will follow daily d.dimer - Assess for ability to prone  - Supportive management -Fluid sparing resuscitation  -Provide oxygen as needed currently on   SpO2: 100 % O2 Flow Rate (L/min): 4 L/min - IF d.dimer elvated >5 will increase dose of lovenox -Initiate antibiotics  given evidence worrisome for superinfection  - Consult PCCM if becomes respiratory unstable   Poor Prognostic factors  74 y.o.  Personal hx of DM2, PAD,   HTN  Evidence of  organ damage  Present  AKI,   [place]  tachypnea, tachycardia present on admission   Risk factors for Cytokine storm not met     Will order Airborne and Contact precautions  Family/ patient prognosis discussion: I have discussed case with the family/ patient  who are aware of their prognosis At this point they would like  (patient) to be full code     The treatment plan and use of medications and known side effects were discussed with  family. It was clearly explained that there is no proven definitive treatment for COVID-19 infection yet. Any medications used here are based on case reports/anecdotal data which are not peer-reviewed and has not been studied using randomized control trials.  Complete risks and long-term side effects are unknown, however in the best clinical judgment they seem to be of some clinical benefit rather than medical risks.  family agree with the treatment plan and want to receive these treatments as indicated.    Dysphagia  - in ER had hard time tolerating PO , will make NPO for now and have SLP eval in AM   . Dyslipidemia -resume home meds when able to pass swallow Evaluation   . Acute metabolic encephalopathy -   - most likely multifactorial secondary to combination of  infection  mild dehydration secondary to decreased by mouth intake,  - Will rehydrate very gently  - treat underlining infection   - Hold contributing medications   - if no improvement may need further imaging to evaluate for CNS pathology pathology such as MRI of the brain, will order CT head for tonight   - neurological exam appears to be nonfocal but patient unable to cooperate fully    - VBG ordered   - no  history of liver disease   Pneumonia possibly aspiration pneumonia.  Will broaden antibiotic coverage to Unasyn  . Hypertension - resume home meds when able to tolerate, permissive HTn for tonight  . Leukocytosis- evaluate for any evidence of bacterial infection, CXR atypical for Covid pneumonia could be more consistent with bacterial superinfection or aspiration pneumonia Also has chronic wound on left lower extremity.  Appears to be stable no evidence of osteo on plain imaging    . PVD (peripheral vascular disease) ([place]) -patient was on Plavix last dose was today on 16 September.  Patient have had trouble tolerating p.o. meds with choking episodes in the emergency department will have speech pathology reevaluate and resume when able patient has recent stenting  . Type 2 diabetes mellitus with peripheral neuropathy (H[facility]) -order sliding scale hold home PO medication  Chronic iron deficiency anemia currently stable continue to monitor no indication for transfusion at this time  Chronic diastolic CHF now current appears to be in a dry side will hold Lasix gently rehydrate Other plan as per orders.  Acute respiratory failure likely secondary to Covid pneumonia versus aspiration pneumonia.  We will continue to monitor also given bed  significantly elevated D-dimer will obtain CTA this also helpful to evaluate nature of pneumonia  Continues pulse oximetry provide oxygen as needed  DVT prophylaxis:   Lovenox    Code Status:    Code Status: Prior FULL CODE  as per family  I had personally discussed CODE STATUS with family  Family Communication:   Family not at  Bedside  plan of care was discussed on the phone with  Wife,  Disposition Plan: To home once workup is complete and patient is stable   Following barriers for discharge:                           Hypoxia improved and stable                               Afebrile, white count improving able to transition to PO antibiotics                             Will need to be able to tolerate PO                            Will likely need home health, home O2, set up                    Would benefit from PT/OT eval prior to DC  Ordered                   Swallow eval - SLP ordered                                       Consults called: none   Admission status:  ED Disposition    ED Disposition Condition [place]: [facility] [[id number]]  Level of Care: Telemetry Medical [[id number]]  May admit patient to [facility] or [facility] if equivalent level of care is available:: No  Covid Evaluation: Confirmed COVID Positive  Diagnosis: Pneumonia due to COVID-19 virus [[id number]]  Admitting  Physician: Toy Baker [[id number]]  Attending Physician: Toy Baker [[id number]]  Estimated length of stay: 3 - 4 days  Certification:: I certify this patient will need inpatient services for at least 2 midnights          inpatient     I Expect 2 midnight stay secondary to severity of patient's current illness need for inpatient interventions justified by the following:  hemodynamic instability despite optimal treatment (  Tachypnea hypoxia, )  Severe lab/radiological/exam abnormalities including:    COVID positive, PNA and extensive  comorbidities including:  DM2   CHF   CKD .  History of amputation   That are currently affecting medical management.   I expect  patient to be hospitalized for 2 midnights requiring inpatient medical care.  Patient is at high risk for adverse outcome (such as loss of life or disability) if not treated.  Indication for inpatient stay as follows:  Severe change from baseline regarding mental status Hemodynamic instability despite maximal medical therapy,    inability to maintain oral hydration    New or worsening hypoxia  Need for IV antibiotics, IV fluids, IV rate controling medications, IV antihypertensives, IV pain medications, IV anticoagulation    Level of care       tele    indefinitely please discontinue once patient no longer qualifies COVID-19 Labs    Lab Results  Component Value Date   SARSCOV2NAA POSITIVE (A) 08/09/2020     Precautions: admitted as   covid positive Airborne and Contact precautions    PPE: Used by the provider:   P100  eye Goggles,  Gloves  gown   Efrat Zuidema 08/10/2020, 12:09 AM     [facility]     after 2 AM please page floor coverage PA If 7AM-7PM, please contact the day team taking care of the patient using [email or web address]   Patient was evaluated in the context of the global COVID-19 pandemic, which necessitated consideration that the patient might be at risk for infection with the SARS-CoV-2 virus that causes COVID-19. Institutional protocols and algorithms that pertain to the evaluation of patients at risk for COVID-19 are in a state of rapid change based on information released by regulatory bodies including the CDC and federal and state organizations. These policies and algorithms were followed during the patient's care.

## 2020-08-09 NOTE — ED Notes (Signed)
Pt began coughing after taking oral meds and thin liquids, pt reports that he always coughs after drinking but does not drink clear liquids at home.

## 2020-08-09 NOTE — Progress Notes (Signed)
Pharmacy Antibiotic Note  Jeremiah [name] is a 74 y.o. male admitted on 08/09/2020 with asp pna.  Pharmacy has been consulted for Unasyn dosing.  Wt 72 kg, est CrCl 45 ml/min  Plan: Unasyn 3gm IV q8h Will f/u renal function, micro data, and pt's clinical condition     Temp (24hrs), Avg:100.3 F (37.9 C), Min:99 F (37.2 C), Max:101.5 F (38.6 C)  Recent Labs  Lab 08/09/20 1944 08/09/20 2101  WBC 18.2*  --   CREATININE 1.44*  --   LATICACIDVEN 1.3 0.9    CrCl cannot be calculated (Unknown ideal weight.).    No Known Allergies  Antimicrobials this admission: 9/16 Unasyn >>  9/16 Azith >> 9/16 Rocephin x1  Microbiology results:  BCx:  9/16 UCx:    Thank you for allowing pharmacy to be a part of this patient's care.  Jeremiah [name], PharmD, BCPS Please see amion for complete clinical pharmacist phone list 08/09/2020 11:50 PM

## 2020-08-09 NOTE — ED Notes (Signed)
Jeremiah [name] [phone] call with updates

## 2020-08-09 NOTE — ED Notes (Signed)
Rectal 101.5

## 2020-08-09 NOTE — ED Provider Notes (Signed)
[facility] Note   CSN: [id number] Arrival date & time: 08/09/20  1858     History Chief Complaint  Patient presents with  . Code Sepsis    Jeremiah [name] is a 74 y.o. male.  Pt presents to the ED today with AMS.  The pt is from home and lives with his wife who is also disabled.  His son came to check on him today and found him confused.  The pt's RA O2 sat for EMS was 85%.  They put him on 2L.  Pt has had a cough and a fever.  Pt has been vaccinated.        Past Medical History:  Diagnosis Date  . Amputee   . Aortic atherosclerosis (HCC)   . Arthritis    "joints; shoulders, knees, hands, back" (05/21/2018)  . Atherosclerosis of coronary artery   . C. difficile diarrhea 04/2018  . Diastolic CHF (HCC)   . Diverticulosis   . High cholesterol   . History of gout   . Hypertension   . IDA (iron deficiency anemia)    from referral Dr Darleene CleaverAsenso  . Internal hemorrhoids   . Peripheral vascular disease (HCC)   . Pneumonia    "couple times" (05/21/2018)  . Sleep apnea    "has mask; won't use" (05/21/2018)  . Type II diabetes mellitus [facility](HCC)     Patient Active Problem List   Diagnosis Date Noted  . Weakness   . Acute lower UTI 06/25/2020  . Abdominal pain   . Gangrene (HCC) 06/18/2020  . Post-operative pain 04/21/2019  . Abnormality of gait 03/24/2019  . Hypoglycemia   . Hyperkalemia   . Hyponatremia   . Diabetic peripheral neuropathy (HCC)   . Labile blood glucose   . Enteritis due to Clostridium difficile   . Blood glucose abnormal   . Diarrhea   . Benign essential HTN   . Hypoalbuminemia due to protein-calorie malnutrition (HCC)   . Labile blood pressure   . 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 Postoperative pain   . Acute blood loss anemia   . Dyslipidemia   . Type 2 diabetes mellitus with peripheral neuropathy (HCC)   . S/P AKA (above knee amputation) (HCC) 11/12/2018  . Unilateral AKA, right (HCC)   .  Hypertensive crisis   . Diabetes mellitus type 2 in nonobese (HCC)   . Pleural effusion 11/08/2018  . Fever   . Open wound of right foot   . Sepsis (HCC) 11/03/2018  . Altered mental status   . Acute cystitis without hematuria   . Leukocytosis   . Pressure injury of skin 07/08/2018  . Clostridium difficile colitis 07/07/2018  . Hypokalemia 07/07/2018  . HLD (hyperlipidemia) 07/07/2018  . Acute diverticulitis 06/04/2018  . Cholecystitis 05/24/2018  . Cholecystitis with cholelithiasis 05/21/2018  . Acute systolic heart failure (HCC) 03/27/2018  . Diabetes mellitus without complication (HCC) 01/30/2018  . Hypertension 01/30/2018  . Acute cholecystitis 01/30/2018  . AKI (acute kidney injury) (HCC) 01/30/2018  . Normocytic anemia 01/30/2018  . PVD (peripheral vascular disease) (HCC) 09/05/2014    Past Surgical History:  Procedure Laterality Date  . ABDOMINAL AORTOGRAM W/LOWER EXTREMITY N/A 11/01/2018   Procedure: ABDOMINAL AORTOGRAM W/LOWER EXTREMITY;  Surgeon: Runell GessBerry, Jonathan J, MD;  Location: [facility] INVASIVE CV LAB;  Service: Cardiovascular;  Laterality: N/A;  . ABDOMINAL AORTOGRAM W/LOWER EXTREMITY Left 06/20/2020   Procedure: ABDOMINAL AORTOGRAM W/LOWER EXTREMITY;  Surgeon: Cephus Shellinglark, Christopher J, MD;  Location:  [facility] INVASIVE CV LAB;  Service: Cardiovascular;  Laterality: Left;  . AMPUTATION Right 11/09/2018   Procedure: RIGHT - AMPUTATION ABOVE KNEE;  Surgeon: Maeola Harman, MD;  Location: [facility] OR;  Service: Vascular;  Laterality: Right;  . CATARACT EXTRACTION W/ INTRAOCULAR LENS  IMPLANT, BILATERAL Bilateral   . CHOLECYSTECTOMY  05/21/2018   ATTEMPTED LAPAROSCOPIC CHOLECYSTECTOMY, OPEN DRAINAGE OF GALLBLADDER WITH BIOPSY  . CHOLECYSTECTOMY N/A 05/21/2018   Procedure: ATTEMPTED LAPAROSCOPIC CHOLECYSTECTOMY, OPEN DRAINAGE OF GALLBLADDER WITH BIOPSY;  Surgeon: Griselda Miner, MD;  Location: [facility] OR;  Service: General;  Laterality: N/A;  . COLONOSCOPY W/ POLYPECTOMY    . COLONOSCOPY  WITH PROPOFOL N/A 09/16/2018   Procedure: COLONOSCOPY WITH PROPOFOL;  Surgeon: Charlott Rakes, MD;  Location: [facility] ENDOSCOPY;  Service: Endoscopy;  Laterality: N/A;  . 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 FLEXIBLE SIGMOIDOSCOPY N/A 04/20/2020   Procedure: FLEXIBLE SIGMOIDOSCOPY;  Surgeon: Meridee Score Netty Starring., MD;  Location: [facility];  Service: Gastroenterology;  Laterality: N/A;  Fecal Disimpaction  . IR CATHETER TUBE CHANGE  04/14/2018  . IR CHOLANGIOGRAM EXISTING TUBE  03/17/2018  . IR PERC CHOLECYSTOSTOMY  01/31/2018  . IR RADIOLOGIST EVAL & MGMT  03/02/2018  . PERIPHERAL VASCULAR INTERVENTION Left 06/20/2020   Procedure: PERIPHERAL VASCULAR INTERVENTION;  Surgeon: Cephus Shelling, MD;  Location: [facility] INVASIVE CV LAB;  Service: Cardiovascular;  Laterality: Left;  4 SFA STENTS       Family History  Problem Relation Age of Onset  . Diabetes Mother   . Hypertension Mother   . Heart disease Father   . Heart attack Father   . Diabetes Sister   . Hypertension Sister   . Diabetes Brother   . Heart disease Brother   . Hypertension Brother   . Heart attack Brother     Social History   Tobacco Use  . Smoking status: Former Smoker    Years: 24.00    Types: Cigarettes    Quit date: 11/25/1983    Years since quitting: 36.7  . Smokeless tobacco: Never Used  Vaping Use  . Vaping Use: Never used  Substance Use Topics  . Alcohol use: Not Currently  . Drug use: No    Home Medications Prior to Admission medications   Medication Sig Start Date End Date Taking? Authorizing Provider  acetaminophen (TYLENOL) 500 MG tablet Take 1,000 mg by mouth every 6 (six) hours as needed for moderate pain or headache.    [provider]  aspirin 81 MG tablet Take 81 mg by mouth daily.    [provider]  atorvastatin (LIPITOR) 20 MG tablet Take 1 tablet  (20 mg total) by mouth daily. 12/06/18   Angiulli, Mcarthur Rossetti, PA-C  brimonidine (ALPHAGAN) 0.2 % ophthalmic solution Place 1 drop into both eyes 2 (two) times daily. 12/06/18   Angiulli, Mcarthur Rossetti, PA-C  clopidogrel (PLAVIX) 75 MG tablet Take 1 tablet (75 mg total) by mouth daily with breakfast. 06/22/20   Rhetta Mura, MD  ferrous sulfate 325 (65 FE) MG tablet Take 1 tablet (325 mg total) by mouth 2 (two) times daily with a meal. 06/28/20   Zannie Cove, MD  gabapentin (NEURONTIN) 300 MG capsule Take 1 capsule (300 mg total) by mouth 2 (two) times daily. 06/28/20   Zannie Cove, MD  glimepiride (AMARYL) 1 MG tablet Take 1 tablet (1 mg total) by mouth daily with breakfast.  12/06/18   Angiulli, Mcarthur Rossetti, PA-C  glucose blood test strip Accu-Chek Aviva Plus test strips  Take 1 strip 3 times a day by miscell. route for 90 days.    [provider]  glucose blood test strip Accu-Chek Aviva Plus test strips  USE AS DIRECTED THREE TIMES DAILY.    [provider]  hydrALAZINE (APRESOLINE) 100 MG tablet Take 0.5 tablets (50 mg total) by mouth every 8 (eight) hours. 06/28/20   Zannie Cove, MD  metoprolol succinate (TOPROL-XL) 50 MG 24 hr tablet Take 50 mg by mouth daily.  03/18/19   [provider]  timolol (TIMOPTIC) 0.5 % ophthalmic solution Place 1 drop into both eyes 2 (two) times daily. 12/06/18   Angiulli, Mcarthur Rossetti, PA-C  traMADol (ULTRAM) 50 MG tablet Take 50 mg by mouth every 8 (eight) hours as needed for moderate pain.     [provider]    Allergies    Patient has no known allergies.  Review of Systems   Review of Systems  Unable to perform ROS: Mental status change  Respiratory: Positive for cough.   All other systems reviewed and are negative.   Physical Exam Updated Vital Signs BP (!) 146/46   Pulse (!) 59   Temp (!) 101.5 F (38.6 C) (Rectal)   Resp (!) 36   SpO2 100%   Physical Exam Vitals and nursing note reviewed.  Constitutional:        Appearance: He is ill-appearing.  HENT:     Head: Normocephalic and atraumatic.     Right Ear: External ear normal.     Left Ear: External ear normal.     Nose: Nose normal.     Mouth/Throat:     Mouth: Mucous membranes are dry.  Eyes:     Extraocular Movements: Extraocular movements intact.     Conjunctiva/sclera: Conjunctivae normal.     Pupils: Pupils are equal, round, and reactive to light.  Cardiovascular:     Rate and Rhythm: Normal rate and regular rhythm.     Pulses: Normal pulses.     Heart sounds: Normal heart sounds.  Pulmonary:     Effort: Pulmonary effort is normal.     Breath sounds: Rhonchi present.  Abdominal:     General: Abdomen is flat. Bowel sounds are normal.     Palpations: Abdomen is soft.  Musculoskeletal:        General: Normal range of motion.     Cervical back: Normal range of motion and neck supple.     Comments: Right AKA  Skin:    General: Skin is warm.     Capillary Refill: Capillary refill takes less than 2 seconds.  Neurological:     General: No focal deficit present.     Mental Status: He is alert and oriented to person, place, and time.  Psychiatric:        Mood and Affect: Mood normal.        Behavior: Behavior normal.     ED Results / Procedures / Treatments   Labs (all labs ordered are listed, but only abnormal results are displayed) Labs Reviewed  SARS CORONAVIRUS 2 BY RT PCR (HOSPITAL ORDER, PERFORMED IN [facility]) - Abnormal; Notable for the following components:      Result Value   SARS Coronavirus 2 POSITIVE (*)    All other components within normal limits  COMPREHENSIVE METABOLIC PANEL - Abnormal; Notable for the following components:   Sodium 133 (*)  Chloride 97 (*)    Glucose, Bld 150 (*)    Creatinine, Ser 1.44 (*)    Albumin 2.6 (*)    GFR calc non Af Amer 47 (*)    GFR calc Af Amer 54 (*)    All other components within normal limits  CBC WITH DIFFERENTIAL/PLATELET - Abnormal; Notable for the  following components:   WBC 18.2 (*)    RBC 3.56 (*)    Hemoglobin 9.6 (*)    HCT 30.5 (*)    Neutro Abs 13.9 (*)    Monocytes Absolute 1.6 (*)    Eosinophils Absolute 1.2 (*)    All other components within normal limits  CULTURE, BLOOD (SINGLE)  URINE CULTURE  LACTIC ACID, PLASMA  PROTIME-INR  APTT  LACTIC ACID, PLASMA  URINALYSIS, ROUTINE W REFLEX MICROSCOPIC  D-DIMER, QUANTITATIVE (NOT AT [facility])  PROCALCITONIN  LACTATE DEHYDROGENASE  FERRITIN  TRIGLYCERIDES  FIBRINOGEN  C-REACTIVE PROTEIN  I-STAT VENOUS BLOOD GAS, ED    EKG EKG Interpretation  Date/Time:  Thursday August 09 2020 22:11:01 EDT Ventricular Rate:  62 PR Interval:    QRS Duration: 80 QT Interval:  442 QTC Calculation: 449 R Axis:   59 Text Interpretation: Normal sinus rhythm RSR' in V1 or V2, probably normal variant Baseline wander in lead(s) V6 No significant change since last tracing Confirmed by Jacalyn Lefevre [phone]) on 08/09/2020 10:28:18 PM   Radiology DG Chest Port 1 View  Result Date: 08/09/2020 CLINICAL DATA:  Questionable sepsis EXAM: PORTABLE CHEST 1 VIEW COMPARISON:  Chest x-ray 06/25/2020 FINDINGS: The heart size and mediastinal contours are unchanged. Aortic arch calcifications. Interval development of hazy airspace opacity within the right lower lobe. No pulmonary edema. Similar-appearing at least small to moderate volume right pleural effusion. No left pleural effusion. No pneumothorax. No acute osseous abnormality. IMPRESSION: Stable small to moderate right pleural effusion with interval increase in right lower lobe opacity that may represent inflammation/infection versus passive atelectasis. Electronically Signed   By: Tish Frederickson M.D.   On: 08/09/2020 20:14    Procedures Procedures (including critical care time)  Medications Ordered in ED Medications  acetaminophen (TYLENOL) tablet 1,000 mg (has no administration in time range)  dexamethasone (DECADRON) injection 10 mg (has no  administration in time range)  cefTRIAXone (ROCEPHIN) 1 g in sodium chloride 0.9 % 100 mL IVPB (0 g Intravenous Stopped 08/09/20 2139)  azithromycin (ZITHROMAX) 500 mg in sodium chloride 0.9 % 250 mL IVPB (0 mg Intravenous Stopped 08/09/20 2140)    ED Course  I have reviewed the triage vital signs and the nursing notes.  Pertinent labs & imaging results that were available during my care of the patient were reviewed by me and considered in my medical decision making (see chart for details).    MDM Rules/Calculators/A&P                          Pt's O2 sat went back down to 82% on RA.  The pt was put on 4L via [place].  Pna and pleural effusion on CXR.  Pt given rocephin/zithromax/decadron.  Pt d/w Dr. Adela Glimpse ([facility]) for admission.  CRITICAL CARE Performed by: Jacalyn Lefevre   Total critical care time: 30 minutes  Critical care time was exclusive of separately billable procedures and treating other patients.  Critical care was necessary to treat or prevent imminent or life-threatening deterioration.  Critical care was time spent personally by me on the following activities: development of treatment  plan with patient and/or surrogate as well as nursing, discussions with consultants, evaluation of patient's response to treatment, examination of patient, obtaining history from patient or surrogate, ordering and performing treatments and interventions, ordering and review of laboratory studies, ordering and review of radiographic studies, pulse oximetry and re-evaluation of patient's condition.  Jeremiah [name] was evaluated in Emergency Department on 08/09/2020 for the symptoms described in the history of present illness. He was evaluated in the context of the global COVID-19 pandemic, which necessitated consideration that the patient might be at risk for infection with the SARS-CoV-2 virus that causes COVID-19. Institutional protocols and algorithms that pertain to the evaluation of patients at  risk for COVID-19 are in a state of rapid change based on information released by regulatory bodies including the CDC and federal and state organizations. These policies and algorithms were followed during the patient's care in the ED.  Final Clinical Impression(s) / ED Diagnoses Final diagnoses:  Pneumonia due to COVID-19 virus  Altered mental status, unspecified altered mental status type  Acute respiratory failure with hypoxia [facility])    Rx / DC Orders ED Discharge Orders    None       Jacalyn Lefevre, MD 08/09/20 2241

## 2020-08-09 NOTE — ED Notes (Signed)
Spoke to Dr. Adela Glimpse concerns with pt coughing when swallowing water or pills. Pt states this has been going on at home. Per MD pt NPO at this time with SLP eval pending for tomorrow

## 2020-08-10 ENCOUNTER — Inpatient Hospital Stay (HOSPITAL_COMMUNITY): Payer: Medicare Other

## 2020-08-10 ENCOUNTER — Encounter (HOSPITAL_COMMUNITY): Payer: Self-pay | Admitting: Internal Medicine

## 2020-08-10 LAB — CBC WITH DIFFERENTIAL/PLATELET
Abs Immature Granulocytes: 0.13 10*3/uL — ABNORMAL HIGH (ref 0.00–0.07)
Basophils Absolute: 0.1 10*3/uL (ref 0.0–0.1)
Basophils Relative: 0 %
Eosinophils Absolute: 0 10*3/uL (ref 0.0–0.5)
Eosinophils Relative: 0 %
HCT: 28.2 % — ABNORMAL LOW (ref 39.0–52.0)
Hemoglobin: 9.1 g/dL — ABNORMAL LOW (ref 13.0–17.0)
Immature Granulocytes: 1 %
Lymphocytes Relative: 4 %
Lymphs Abs: 0.9 10*3/uL (ref 0.7–4.0)
MCH: 27.6 pg (ref 26.0–34.0)
MCHC: 32.3 g/dL (ref 30.0–36.0)
MCV: 85.5 fL (ref 80.0–100.0)
Monocytes Absolute: 0.9 10*3/uL (ref 0.1–1.0)
Monocytes Relative: 4 %
Neutro Abs: 20.4 10*3/uL — ABNORMAL HIGH (ref 1.7–7.7)
Neutrophils Relative %: 91 %
Platelets: 226 10*3/uL (ref 150–400)
RBC: 3.3 MIL/uL — ABNORMAL LOW (ref 4.22–5.81)
RDW: 13.7 % (ref 11.5–15.5)
WBC: 22.3 10*3/uL — ABNORMAL HIGH (ref 4.0–10.5)
nRBC: 0 % (ref 0.0–0.2)

## 2020-08-10 LAB — URINE CULTURE: Culture: NO GROWTH

## 2020-08-10 LAB — CBG MONITORING, ED
Glucose-Capillary: 141 mg/dL — ABNORMAL HIGH (ref 70–99)
Glucose-Capillary: 150 mg/dL — ABNORMAL HIGH (ref 70–99)
Glucose-Capillary: 152 mg/dL — ABNORMAL HIGH (ref 70–99)
Glucose-Capillary: 175 mg/dL — ABNORMAL HIGH (ref 70–99)
Glucose-Capillary: 206 mg/dL — ABNORMAL HIGH (ref 70–99)

## 2020-08-10 LAB — I-STAT VENOUS BLOOD GAS, ED
Acid-Base Excess: 2 mmol/L (ref 0.0–2.0)
Bicarbonate: 26.1 mmol/L (ref 20.0–28.0)
Calcium, Ion: 1.12 mmol/L — ABNORMAL LOW (ref 1.15–1.40)
HCT: 26 % — ABNORMAL LOW (ref 39.0–52.0)
Hemoglobin: 8.8 g/dL — ABNORMAL LOW (ref 13.0–17.0)
O2 Saturation: 99 %
Potassium: 4 mmol/L (ref 3.5–5.1)
Sodium: 136 mmol/L (ref 135–145)
TCO2: 27 mmol/L (ref 22–32)
pCO2, Ven: 39.5 mmHg — ABNORMAL LOW (ref 44.0–60.0)
pH, Ven: 7.428 (ref 7.250–7.430)
pO2, Ven: 120 mmHg — ABNORMAL HIGH (ref 32.0–45.0)

## 2020-08-10 LAB — COMPREHENSIVE METABOLIC PANEL
ALT: 18 U/L (ref 0–44)
AST: 19 U/L (ref 15–41)
Albumin: 2.3 g/dL — ABNORMAL LOW (ref 3.5–5.0)
Alkaline Phosphatase: 87 U/L (ref 38–126)
Anion gap: 12 (ref 5–15)
BUN: 15 mg/dL (ref 8–23)
CO2: 25 mmol/L (ref 22–32)
Calcium: 8.5 mg/dL — ABNORMAL LOW (ref 8.9–10.3)
Chloride: 96 mmol/L — ABNORMAL LOW (ref 98–111)
Creatinine, Ser: 1.46 mg/dL — ABNORMAL HIGH (ref 0.61–1.24)
GFR calc Af Amer: 53 mL/min — ABNORMAL LOW (ref >60)
GFR calc non Af Amer: 46 mL/min — ABNORMAL LOW (ref >60)
Glucose, Bld: 173 mg/dL — ABNORMAL HIGH (ref 70–99)
Potassium: 4.1 mmol/L (ref 3.5–5.1)
Sodium: 133 mmol/L — ABNORMAL LOW (ref 135–145)
Total Bilirubin: 0.5 mg/dL (ref 0.3–1.2)
Total Protein: 7.5 g/dL (ref 6.5–8.1)

## 2020-08-10 LAB — STREP PNEUMONIAE URINARY ANTIGEN: Strep Pneumo Urinary Antigen: NEGATIVE

## 2020-08-10 LAB — HEMOGLOBIN A1C
Hgb A1c MFr Bld: 6 % — ABNORMAL HIGH (ref 4.8–5.6)
Mean Plasma Glucose: 125.5 mg/dL

## 2020-08-10 LAB — GLUCOSE, CAPILLARY
Glucose-Capillary: 153 mg/dL — ABNORMAL HIGH (ref 70–99)
Glucose-Capillary: 231 mg/dL — ABNORMAL HIGH (ref 70–99)

## 2020-08-10 LAB — FERRITIN: Ferritin: 253 ng/mL (ref 24–336)

## 2020-08-10 LAB — TROPONIN I (HIGH SENSITIVITY)
Troponin I (High Sensitivity): 12 ng/L (ref <18)
Troponin I (High Sensitivity): 13 ng/L (ref <18)

## 2020-08-10 LAB — PROCALCITONIN
Procalcitonin: 0.37 ng/mL
Procalcitonin: 0.63 ng/mL
Procalcitonin: 0.87 ng/mL

## 2020-08-10 LAB — SEDIMENTATION RATE: Sed Rate: >140 mm/hr — ABNORMAL HIGH (ref 0–16)

## 2020-08-10 LAB — LACTATE DEHYDROGENASE: LDH: 145 U/L (ref 98–192)

## 2020-08-10 LAB — MAGNESIUM: Magnesium: 1.7 mg/dL (ref 1.7–2.4)

## 2020-08-10 LAB — D-DIMER, QUANTITATIVE: D-Dimer, Quant: 3.06 ug/mL-FEU — ABNORMAL HIGH (ref 0.00–0.50)

## 2020-08-10 LAB — PHOSPHORUS: Phosphorus: 3.9 mg/dL (ref 2.5–4.6)

## 2020-08-10 LAB — C-REACTIVE PROTEIN: CRP: 13.2 mg/dL — ABNORMAL HIGH (ref <1.0)

## 2020-08-10 LAB — FIBRINOGEN: Fibrinogen: >800 mg/dL — ABNORMAL HIGH (ref 210–475)

## 2020-08-10 LAB — BRAIN NATRIURETIC PEPTIDE: B Natriuretic Peptide: 400.1 pg/mL — ABNORMAL HIGH (ref 0.0–100.0)

## 2020-08-10 MED ORDER — METHYLPREDNISOLONE SODIUM SUCC 40 MG IJ SOLR
40.0000 mg | Freq: Every day | INTRAMUSCULAR | Status: DC
  Administered 2020-08-11: 40 mg via INTRAVENOUS
  Filled 2020-08-10: qty 1

## 2020-08-10 MED ORDER — IOHEXOL 350 MG/ML SOLN
75.0000 mL | Freq: Once | INTRAVENOUS | Status: AC | PRN
  Administered 2020-08-10: 55 mL via INTRAVENOUS

## 2020-08-10 MED ORDER — TIMOLOL MALEATE 0.5 % OP SOLN
1.0000 [drp] | Freq: Two times a day (BID) | OPHTHALMIC | Status: DC
  Administered 2020-08-10 – 2020-08-21 (×23): 1 [drp] via OPHTHALMIC
  Filled 2020-08-10 (×4): qty 5

## 2020-08-10 MED ORDER — PANTOPRAZOLE SODIUM 40 MG PO TBEC
40.0000 mg | DELAYED_RELEASE_TABLET | Freq: Every day | ORAL | Status: DC
  Administered 2020-08-10 – 2020-08-21 (×12): 40 mg via ORAL
  Filled 2020-08-10 (×12): qty 1

## 2020-08-10 MED ORDER — TRAMADOL HCL 50 MG PO TABS
50.0000 mg | ORAL_TABLET | Freq: Three times a day (TID) | ORAL | Status: DC | PRN
  Filled 2020-08-10: qty 1

## 2020-08-10 MED ORDER — LACTATED RINGERS IV SOLN: INTRAVENOUS | Status: DC

## 2020-08-10 MED ORDER — METOPROLOL SUCCINATE ER 50 MG PO TB24
50.0000 mg | ORAL_TABLET | Freq: Every day | ORAL | Status: DC
  Administered 2020-08-11: 50 mg via ORAL
  Filled 2020-08-10: qty 2
  Filled 2020-08-10: qty 1

## 2020-08-10 MED ORDER — SODIUM CHLORIDE 0.9 % IV SOLN
3.0000 g | Freq: Three times a day (TID) | INTRAVENOUS | Status: AC
  Administered 2020-08-10 – 2020-08-16 (×21): 3 g via INTRAVENOUS
  Filled 2020-08-10 (×3): qty 3
  Filled 2020-08-10 (×2): qty 8
  Filled 2020-08-10: qty 3
  Filled 2020-08-10: qty 8
  Filled 2020-08-10: qty 3
  Filled 2020-08-10 (×11): qty 8
  Filled 2020-08-10: qty 3
  Filled 2020-08-10 (×3): qty 8

## 2020-08-10 MED ORDER — SODIUM CHLORIDE 0.9 % IV SOLN: 250.0000 mL | INTRAVENOUS | Status: DC | PRN

## 2020-08-10 MED ORDER — CLOPIDOGREL BISULFATE 75 MG PO TABS
75.0000 mg | ORAL_TABLET | Freq: Every day | ORAL | Status: DC
  Administered 2020-08-10 – 2020-08-15 (×6): 75 mg via ORAL
  Filled 2020-08-10 (×6): qty 1

## 2020-08-10 MED ORDER — BRIMONIDINE TARTRATE 0.2 % OP SOLN
1.0000 [drp] | Freq: Two times a day (BID) | OPHTHALMIC | Status: DC
  Administered 2020-08-10 – 2020-08-21 (×22): 1 [drp] via OPHTHALMIC
  Filled 2020-08-10 (×3): qty 5

## 2020-08-10 MED ORDER — ATORVASTATIN CALCIUM 10 MG PO TABS
20.0000 mg | ORAL_TABLET | Freq: Every day | ORAL | Status: DC
  Administered 2020-08-10 – 2020-08-21 (×12): 20 mg via ORAL
  Filled 2020-08-10 (×12): qty 2

## 2020-08-10 MED ORDER — ASPIRIN 81 MG PO CHEW
81.0000 mg | CHEWABLE_TABLET | Freq: Every day | ORAL | Status: DC
  Administered 2020-08-10 – 2020-08-21 (×12): 81 mg via ORAL
  Filled 2020-08-10 (×12): qty 1

## 2020-08-10 MED ORDER — FERROUS SULFATE 325 (65 FE) MG PO TABS
325.0000 mg | ORAL_TABLET | Freq: Two times a day (BID) | ORAL | Status: DC
  Administered 2020-08-10 – 2020-08-21 (×23): 325 mg via ORAL
  Filled 2020-08-10 (×23): qty 1

## 2020-08-10 MED ORDER — METHYLPREDNISOLONE SODIUM SUCC 125 MG IJ SOLR: 60.0000 mg | Freq: Two times a day (BID) | INTRAMUSCULAR | Status: DC

## 2020-08-10 NOTE — Consult Note (Signed)
Hospital Consult    Reason for Consult: Evaluate left foot given previous revascularization Referring Physician: Dr. Thedore Mins MRN #:  [id number]  History of Present Illness: This is a 74 y.o. male with multiple medical comorbidities including diabetes, peripheral vascular disease, hypertension, hyperlipidemia, heart failure that vascular surgery has been asked to evaluate patient's left foot in setting of wound and previous revascularization.  Patient states he presented to the ED for the flu and documentation indicates he had a productive cough and sputum.  Ultimately was found to be Covid positive.  Given some complaints of foot pain vascular surgery was asked to evaluate given previous revascularization.  I previously performed left SFA and above-knee popliteal angioplasty with stent placement for long occluded segment on 06/20/2020.  This was for a large left heel wound.  Previous right AKA.  Past Medical History:  Diagnosis Date  . Amputee   . Aortic atherosclerosis (HCC)   . Arthritis    "joints; shoulders, knees, hands, back" (05/21/2018)  . Atherosclerosis of coronary artery   . C. difficile diarrhea 04/2018  . Diastolic CHF (HCC)   . Diverticulosis   . High cholesterol   . History of gout   . Hypertension   . IDA (iron deficiency anemia)    from referral Dr Darleene Cleaver  . Internal hemorrhoids   . Peripheral vascular disease (HCC)   . Pneumonia    "couple times" (05/21/2018)  . Sleep apnea    "has mask; won't use" (05/21/2018)  . Type II diabetes mellitus (HCC)     Past Surgical History:  Procedure Laterality Date  . ABDOMINAL AORTOGRAM W/LOWER EXTREMITY N/A 11/01/2018   Procedure: ABDOMINAL AORTOGRAM W/LOWER EXTREMITY;  Surgeon: Runell Gess, MD;  Location: [facility];  Service: Cardiovascular;  Laterality: N/A;  . ABDOMINAL AORTOGRAM W/LOWER EXTREMITY Left 06/20/2020   Procedure: ABDOMINAL AORTOGRAM W/LOWER EXTREMITY;  Surgeon: Cephus Shelling, MD;  Location: [facility]  INVASIVE CV LAB;  Service: Cardiovascular;  Laterality: Left;  . AMPUTATION Right 11/09/2018   Procedure: RIGHT - AMPUTATION ABOVE KNEE;  Surgeon: Maeola Harman, MD;  Location: [facility] OR;  Service: Vascular;  Laterality: Right;  . CATARACT EXTRACTION W/ INTRAOCULAR LENS  IMPLANT, BILATERAL Bilateral   . CHOLECYSTECTOMY  05/21/2018   ATTEMPTED LAPAROSCOPIC CHOLECYSTECTOMY, OPEN DRAINAGE OF GALLBLADDER WITH BIOPSY  . CHOLECYSTECTOMY N/A 05/21/2018   Procedure: ATTEMPTED LAPAROSCOPIC CHOLECYSTECTOMY, OPEN DRAINAGE OF GALLBLADDER WITH BIOPSY;  Surgeon: Griselda Miner, MD;  Location: [facility] OR;  Service: General;  Laterality: N/A;  . COLONOSCOPY W/ POLYPECTOMY    . COLONOSCOPY WITH PROPOFOL N/A 09/16/2018   Procedure: COLONOSCOPY WITH PROPOFOL;  Surgeon: Charlott Rakes, MD;  Location: [facility] ENDOSCOPY;  Service: Endoscopy;  Laterality: N/A;  . 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 FLEXIBLE SIGMOIDOSCOPY N/A 04/20/2020   Procedure: FLEXIBLE SIGMOIDOSCOPY;  Surgeon: Meridee Score Netty Starring., MD;  Location: [facility];  Service: Gastroenterology;  Laterality: N/A;  Fecal Disimpaction  . IR CATHETER TUBE CHANGE  04/14/2018  . IR CHOLANGIOGRAM EXISTING TUBE  03/17/2018  . IR PERC CHOLECYSTOSTOMY  01/31/2018  . IR RADIOLOGIST EVAL & MGMT  03/02/2018  . PERIPHERAL VASCULAR INTERVENTION Left 06/20/2020   Procedure: PERIPHERAL VASCULAR INTERVENTION;  Surgeon: Cephus Shelling, MD;  Location: [facility] INVASIVE CV LAB;  Service: Cardiovascular;  Laterality: Left;  4 SFA STENTS    No Known Allergies  Prior to Admission medications  Medication Sig Start Date End Date Taking? Authorizing Provider  acetaminophen (TYLENOL) 500 MG tablet Take 1,000 mg by mouth every 6 (six) hours as needed for moderate pain or headache.    [provider]  aspirin 81 MG tablet Take 81 mg by mouth  daily.    [provider]  atorvastatin (LIPITOR) 20 MG tablet Take 1 tablet (20 mg total) by mouth daily. 12/06/18   Angiulli, Mcarthur Rossetti, PA-C  brimonidine (ALPHAGAN) 0.2 % ophthalmic solution Place 1 drop into both eyes 2 (two) times daily. 12/06/18   Angiulli, Mcarthur Rossetti, PA-C  clopidogrel (PLAVIX) 75 MG tablet Take 1 tablet (75 mg total) by mouth daily with breakfast. 06/22/20   Rhetta Mura, MD  ferrous sulfate 325 (65 FE) MG tablet Take 1 tablet (325 mg total) by mouth 2 (two) times daily with a meal. 06/28/20   Zannie Cove, MD  gabapentin (NEURONTIN) 300 MG capsule Take 1 capsule (300 mg total) by mouth 2 (two) times daily. 06/28/20   Zannie Cove, MD  glimepiride (AMARYL) 1 MG tablet Take 1 tablet (1 mg total) by mouth daily with breakfast. 12/06/18   Angiulli, Mcarthur Rossetti, PA-C  glucose blood test strip Accu-Chek Aviva Plus test strips  Take 1 strip 3 times a day by miscell. route for 90 days.    [provider]  glucose blood test strip Accu-Chek Aviva Plus test strips  USE AS DIRECTED THREE TIMES DAILY.    [provider]  hydrALAZINE (APRESOLINE) 100 MG tablet Take 0.5 tablets (50 mg total) by mouth every 8 (eight) hours. 06/28/20   Zannie Cove, MD  metoprolol succinate (TOPROL-XL) 50 MG 24 hr tablet Take 50 mg by mouth daily.  03/18/19   [provider]  timolol (TIMOPTIC) 0.5 % ophthalmic solution Place 1 drop into both eyes 2 (two) times daily. 12/06/18   Angiulli, Mcarthur Rossetti, PA-C  traMADol (ULTRAM) 50 MG tablet Take 50 mg by mouth every 8 (eight) hours as needed for moderate pain.     [provider]    Social History   Socioeconomic History  . Marital status: Married    Spouse name: Not on file  . Number of children: 2  . Years of education: Not on file  . Highest education level: Not on file  Occupational History  . Occupation: retired  Tobacco Use  . Smoking status: Former Smoker    Years: 24.00    Types: Cigarettes     Quit date: 11/25/1983    Years since quitting: 36.7  . Smokeless tobacco: Never Used  Vaping Use  . Vaping Use: Never used  Substance and Sexual Activity  . Alcohol use: Not Currently  . Drug use: No  . Sexual activity: Not on file  Other Topics Concern  . Not on file  Social History Narrative  . Not on file   Social Determinants of Health   Financial Resource Strain:   . Difficulty of Paying Living Expenses: Not on file  Food Insecurity:   . Worried About Programme researcher, broadcasting/film/video in the Last Year: Not on file  . Ran Out of Food in the Last Year: Not on file  Transportation Needs:   . Lack of Transportation (Medical): Not on file  . Lack of Transportation (Non-Medical): Not on file  Physical Activity:   . Days of Exercise per Week: Not on file  . Minutes of Exercise per Session: Not on file  Stress:   . Feeling of Stress :  Not on file  Social Connections:   . Frequency of Communication with Friends and Family: Not on file  . Frequency of Social Gatherings with Friends and Family: Not on file  . Attends Religious Services: Not on file  . Active Member of Clubs or Organizations: Not on file  . Attends Banker Meetings: Not on file  . Marital Status: Not on file  Intimate Partner Violence:   . Fear of Current or Ex-Partner: Not on file  . Emotionally Abused: Not on file  . Physically Abused: Not on file  . Sexually Abused: Not on file     Family History  Problem Relation Age of Onset  . Diabetes Mother   . Hypertension Mother   . Heart disease Father   . Heart attack Father   . Diabetes Sister   . Hypertension Sister   . Diabetes Brother   . Heart disease Brother   . Hypertension Brother   . Heart attack Brother     ROS: [x]  Positive   [ ]  Negative   [ ]  All sytems reviewed and are negative  Cardiovascular: []  chest pain/pressure []  palpitations []  SOB lying flat []  DOE []  pain in legs while walking []  pain in legs at rest []  pain in legs at  night []  non-healing ulcers []  hx of DVT []  swelling in legs  Pulmonary: []  productive cough []  asthma/wheezing []  home O2  Neurologic: []  weakness in []  arms []  legs []  numbness in []  arms []  legs []  hx of CVA []  mini stroke [] difficulty speaking or slurred speech []  temporary loss of vision in one eye []  dizziness  Hematologic: []  hx of cancer []  bleeding problems []  problems with blood clotting easily  Endocrine:   []  diabetes []  thyroid disease  GI []  vomiting blood []  blood in stool  GU: []  CKD/renal failure []  HD--[]  M/W/F or []  T/T/S []  burning with urination []  blood in urine  Psychiatric: []  anxiety []  depression  Musculoskeletal: []  arthritis []  joint pain  Integumentary: []  rashes []  ulcers  Constitutional: []  fever []  chills   Physical Examination  Vitals:   08/10/20 0900 08/10/20 1100  BP: (!) 120/53 129/66  Pulse: (!) 48 (!) 51  Resp: 12 15  Temp:    SpO2: 100% 100%   There is no height or weight on file to calculate BMI.  General:  NAD Gait: Not observed HENT: WNL, normocephalic Pulmonary: normal non-labored breathing, without Rales, rhonchi,  wheezing Cardiac: regular, without  Murmurs, rubs or gallops Abdomen: soft, NT/ND, no masses Vascular Exam/Pulses: Left AT and DP brisk by doppler Left foot warm Large necrotic ulcer left heel Neurologic: A&O X 3; Appropriate Affect ; SENSATION: normal; MOTOR FUNCTION:  moving all extremities equally. Speech is fluent/normal  CBC    Component Value Date/Time   WBC 22.3 (H) 08/10/2020 0320   RBC 3.30 (L) 08/10/2020 0320   HGB 9.1 (L) 08/10/2020 0320   HGB 8.9 (L) 10/26/2018 1215   HCT 28.2 (L) 08/10/2020 0320   HCT 27.2 (L) 10/26/2018 1215   PLT 226 08/10/2020 0320   PLT 264 10/26/2018 1215   MCV 85.5 08/10/2020 0320   MCV 80 10/26/2018 1215   MCH 27.6 08/10/2020 0320   MCHC 32.3 08/10/2020 0320   RDW 13.7 08/10/2020 0320   RDW 14.6 10/26/2018 1215   LYMPHSABS 0.9  08/10/2020 0320   LYMPHSABS 3.2 (H) 10/26/2018 1215   MONOABS 0.9 08/10/2020 0320   EOSABS 0.0 08/10/2020 0320  EOSABS 1.8 (H) 10/26/2018 1215   BASOSABS 0.1 08/10/2020 0320   BASOSABS 0.1 10/26/2018 1215    BMET    Component Value Date/Time   NA 133 (L) 08/10/2020 0320   NA 138 10/26/2018 1215   K 4.1 08/10/2020 0320   CL 96 (L) 08/10/2020 0320   CO2 25 08/10/2020 0320   GLUCOSE 173 (H) 08/10/2020 0320   BUN 15 08/10/2020 0320   BUN 15 10/26/2018 1215   CREATININE 1.46 (H) 08/10/2020 0320   CALCIUM 8.5 (L) 08/10/2020 0320   GFRNONAA 46 (L) 08/10/2020 0320   GFRAA 53 (L) 08/10/2020 0320    COAGS: Lab Results  Component Value Date   INR 1.2 08/09/2020   INR 1.2 06/25/2020   INR 1.1 06/18/2020     Non-Invasive Vascular Imaging:    None   ASSESSMENT/PLAN: This is a 74 y.o. male with multiple medical comorbidities that presents to the ED now Covid positive with respiratory symptoms.  Vascular surgery was asked to evaluate his left lower extremity.  Previously had left SFA above-knee popliteal revascularization for chronic total occlusion.  He has very brisk anterior tibial and posterior tibial signals at the ankle and suspect his stents remain patent.  He has a large dry necrotic heel ulcer that has been ongoing since time of his revascularization.  There is no active signs of infection.  He is followed by Dr. Randie Heinzain and Dr. Randie Heinzain previously offered him an above-knee amputation which he has refused.  Again states today he has no interest in amputation and just wants to continue wound care.  Discussed will be very difficult to get this wound to heal.  Please call vascular surgery if we can be of further assistance.  He already has arranged follow-up with Dr. Randie Heinzain.  Cephus Shellinghristopher J. Minsa Weddington, MD [facility] Office: [phone]  Cephus Shellinghristopher J Tierrah Anastos

## 2020-08-10 NOTE — Plan of Care (Signed)
Patient admitted to unit via stretcher. A/Ox4, RA, no c/o pain/SOB. Patient given CHG bath and placed on bedside monitor.

## 2020-08-10 NOTE — Consult Note (Addendum)
WOC Nurse Consult Note: Reason for Consult:Left heel with ischemic vs pressure injury. Followed by Dr. [name] of Vascular Surgery and has had intervention to improve flow in the past. Patient does not present to ED for this problem. IN the past, his heel ulcer has been painful and the recommendation has been for amputation. 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 Photos on record from from end of July and end of August. Wound type: Ischemic vs pressure Pressure Injury POA: Yes Measurement: Bedside RN to obtain prior to performing wound care Wound bed: back eschar partially obscuring wound bed. Wound bed red, moist. Drainage (amount, consistency, odor) Scant serosanguinous Periwound: dry Dressing procedure/placement/frequency:I will provide Nursing with a conservative POC with the goal of prevention of further injury and prevention of infection.  The foot is to be washed with soap and water twice daily and patted dry. The heel is to be painted with a betadine swabstick and allowed to air-day (no dressing).  A pressure redistribution heel boot is provided for protection and off loading. A prophylactic sacral silicone foam dressing is placed for PI prevention.  I have communicated with Dr. Burney Gauze this morning via Secure Chat. If desired, he may elect to inform Vascular of the patient's admission, but it is equally prudent to have keep his scheduled appointment in 2 weeks with that service as an outpatient.  WOC nursing team will not follow, but will remain available to this patient, the nursing and medical teams.  Please re-consult if needed. Thanks, Ladona Mow, MSN, RN, GNP, [place]  Pager# [phone]

## 2020-08-10 NOTE — Evaluation (Signed)
Clinical/Bedside Swallow Evaluation Patient Details  Name: Jeremiah [name] MRN: [id number] Date of Birth: 10-Feb-1946  Today's Date: 08/10/2020 Time: SLP Start Time (ACUTE ONLY): 1051 SLP Stop Time (ACUTE ONLY): 1111 SLP Time Calculation (min) (ACUTE ONLY): 20 min  Past Medical History:  Past Medical History:  Diagnosis Date  . Amputee   . Aortic atherosclerosis (HCC)   . Arthritis    "joints; shoulders, knees, hands, back" (05/21/2018)  . Atherosclerosis of coronary artery   . C. difficile diarrhea 04/2018  . Diastolic CHF (HCC)   . Diverticulosis   . High cholesterol   . History of gout   . Hypertension   . IDA (iron deficiency anemia)    from referral Dr Darleene Cleaver  . Internal hemorrhoids   . Peripheral vascular disease (HCC)   . Pneumonia    "couple times" (05/21/2018)  . Sleep apnea    "has mask; won't use" (05/21/2018)  . Type II diabetes mellitus (HCC)    Past Surgical History:  Past Surgical History:  Procedure Laterality Date  . ABDOMINAL AORTOGRAM W/LOWER EXTREMITY N/A 11/01/2018   Procedure: ABDOMINAL AORTOGRAM W/LOWER EXTREMITY;  Surgeon: Runell Gess, MD;  Location: [facility];  Service: Cardiovascular;  Laterality: N/A;  . ABDOMINAL AORTOGRAM W/LOWER EXTREMITY Left 06/20/2020   Procedure: ABDOMINAL AORTOGRAM W/LOWER EXTREMITY;  Surgeon: Cephus Shelling, MD;  Location: [facility];  Service: Cardiovascular;  Laterality: Left;  . AMPUTATION Right 11/09/2018   Procedure: RIGHT - AMPUTATION ABOVE KNEE;  Surgeon: Maeola Harman, MD;  Location: [facility] OR;  Service: Vascular;  Laterality: Right;  . CATARACT EXTRACTION W/ INTRAOCULAR LENS  IMPLANT, BILATERAL Bilateral   . CHOLECYSTECTOMY  05/21/2018   ATTEMPTED LAPAROSCOPIC CHOLECYSTECTOMY, OPEN DRAINAGE OF GALLBLADDER WITH BIOPSY  . CHOLECYSTECTOMY N/A 05/21/2018   Procedure: ATTEMPTED LAPAROSCOPIC CHOLECYSTECTOMY, OPEN DRAINAGE OF GALLBLADDER WITH BIOPSY;  Surgeon: Griselda Miner, MD;   Location: [facility] OR;  Service: General;  Laterality: N/A;  . COLONOSCOPY W/ POLYPECTOMY    . COLONOSCOPY WITH PROPOFOL N/A 09/16/2018   Procedure: COLONOSCOPY WITH PROPOFOL;  Surgeon: Charlott Rakes, MD;  Location: [facility];  Service: Endoscopy;  Laterality: N/A;  . 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 FLEXIBLE SIGMOIDOSCOPY N/A 04/20/2020   Procedure: FLEXIBLE SIGMOIDOSCOPY;  Surgeon: Meridee Score Netty Starring., MD;  Location: [facility];  Service: Gastroenterology;  Laterality: N/A;  Fecal Disimpaction  . IR CATHETER TUBE CHANGE  04/14/2018  . IR CHOLANGIOGRAM EXISTING TUBE  03/17/2018  . IR PERC CHOLECYSTOSTOMY  01/31/2018  . IR RADIOLOGIST EVAL & MGMT  03/02/2018  . PERIPHERAL VASCULAR INTERVENTION Left 06/20/2020   Procedure: PERIPHERAL VASCULAR INTERVENTION;  Surgeon: Cephus Shelling, MD;  Location: [facility] INVASIVE CV LAB;  Service: Cardiovascular;  Laterality: Left;  4 SFA STENTS   HPI:  74 y.o. male with medical history significant for PVD  Sp Left SFA stent, HTN, iron deficiency anemia, [name], [name], chronic diastolic CHF, aortic stenosis, [name], [name], [name], [name], apparently has had diarrhea for the last several weeks, upon arrival to the ER he was found to have possible COVID-19 pneumonia versus aspiration pneumonia along with dehydration and metabolic encephalopathy and he was admitted to the hospital.  He is fully vaccinated for COVID-19.   Assessment / Plan / Recommendation Clinical Impression  Pt presents with normal  oropharyngeal swallow with adequate mastication, brisk swallow response, and no s/s of aspiration with any tested consistencies.  Pt verbalizes no difficulty.  Oral mechanism exam is WNL.  Recommend regular solids, thin liquids, no SLP f/u.  SLP Visit Diagnosis: Dysphagia,  unspecified (R13.10)    Aspiration Risk  No limitations    Diet Recommendation   regular solids, thin liquids  Medication Administration: Whole meds with liquid    Other  Recommendations Oral Care Recommendations: Oral care BID   Follow up Recommendations None      Frequency and Duration            Prognosis        Swallow Study   General Date of Onset: 08/10/20 HPI: 74 y.o. male with medical history significant for PVD  Sp Left SFA stent, HTN, iron deficiency anemia, [name], [name], chronic diastolic CHF, aortic stenosis, [name], [name], [name], [name], apparently has had diarrhea for the last several weeks, upon arrival to the ER he was found to have possible COVID-19 pneumonia versus aspiration pneumonia along with dehydration and metabolic encephalopathy and he was admitted to the hospital.  He is fully vaccinated for COVID-19. Type of Study: Bedside Swallow Evaluation Previous Swallow Assessment: 2019 normal findings Diet Prior to this Study: NPO Temperature Spikes Noted: No Respiratory Status: Nasal cannula History of Recent Intubation: No Behavior/Cognition: Alert;Cooperative Oral Cavity Assessment: Within Functional Limits Oral Care Completed by SLP: No Oral Cavity - Dentition: Dentures, top Vision: Functional for self-feeding Self-Feeding Abilities: Able to feed self Patient Positioning: Upright in bed Baseline Vocal Quality: Normal Volitional Cough: Strong Volitional Swallow: Able to elicit    Oral/Motor/Sensory Function Overall Oral Motor/Sensory Function: Within functional limits   Ice Chips Ice chips: Within functional limits   Thin Liquid Thin Liquid: Within functional limits Presentation: Straw;Cup    Nectar Thick Nectar Thick Liquid: Not tested   Honey Thick Honey Thick Liquid: Not tested   Puree Puree: Within functional limits   Solid     Solid: Within functional limits       Blenda Mounts Laurice 08/10/2020,11:15 AM   Marchelle Folks L. Samson Frederic, MA CCC/SLP [facility] Office number [phone] Pager [phone]

## 2020-08-10 NOTE — Progress Notes (Addendum)
PROGRESS NOTE                                                                                                                                                                                                             Patient Demographics:    Jeremiah [name], is a 74 y.o. male, DOB - 16-Oct-1946, DBZ:[id number]  Outpatient Primary MD for the patient is Clovia Cuff, MD    LOS - 1  Admit date - 08/09/2020    Chief Complaint  Patient presents with  . Code Sepsis       Brief Narrative - 74 y.o. male with medical history significant of PVD  Sp Left SFA stent, HTN, iron deficiency anemia, [name], [name], chronic diastolic CHF, aortic stenosis, [name], [name], [name], [name], apparently has had diarrhea for the last several weeks, upon arrival to the ER he was found to have possible COVID-19 pneumonia versus aspiration pneumonia along with dehydration and metabolic encephalopathy and he was admitted to the hospital.  He is fully vaccinated for COVID-19.   Subjective:    Jeremiah [name] today has, No headache, No chest pain, No abdominal pain - No Nausea, No new weakness tingling or numbness, denies any cough or shortness of breath.   Assessment  & Plan :     1. Incidental COVID-19 infection in a patient was fully vaccinated.- I do not think this is causing any issues will monitor for now.  He was started on IV steroids and Remdesivir upon admission will continue cautiously although I do not think he has active COVID-19 pneumonia.  Will taper off steroids quickly.  Encouraged the patient to sit up in chair in the daytime use I-S and flutter valve for pulmonary toiletry and then prone in bed when at night.  Will advance activity and titrate down oxygen as possible.   SpO2: 100 % O2 Flow Rate (L/min): 2 L/min  Recent Labs  Lab 08/09/20 1944 08/09/20 1956  08/09/20 2101 08/09/20 2156 08/10/20 0033 08/10/20 0320  WBC 18.2*  --   --   --   --  22.3*  PLT 231  --   --   --   --  226  CRP  --   --   --  9.6*  --  13.2*  DDIMER  --   --   --  3.30*  --  3.06*  PROCALCITON  --   --   --  0.37 0.63  --   AST 18  --   --   --   --  19  ALT 17  --   --   --   --  18  ALKPHOS 91  --   --   --   --  87  BILITOT 0.4  --   --   --   --  0.5  ALBUMIN 2.6*  --   --   --   --  2.3*  INR 1.2  --   --   --   --   --   LATICACIDVEN 1.3  --  0.9  --   --   --   SARSCOV2NAA  --  POSITIVE*  --   --   --   --       2. PAD with R AKA, recent L. SMA stent with left heel ulcer present on admission.  Wound care on board, continue on dual antiplatelet therapy along with statin for secondary prevention, he follows with Dr. Donzetta Matters, wound care has been consulted, since his ESR is extremely elevated, seen by vascular surgery this admission appreciate their input.  They have persistently recommended left leg amputation but patient has been resistant and still wants wound care, this very well might be the source of his infection and inflammation.  For now continue present antibiotics.  3.  Possible aspiration pneumonia.  Agree with Unasyn and azithromycin for now continue.  Will check MRSA nasal PCR and follow cultures and procalcitonin.  Speech therapy to evaluate.   4.  Metabolic encephalopathy upon admission.  Improving.  Head CT nonacute.  No focal deficits.  5.  Mild hypoxia upon admission.  Currently 100% on 2 L and in no distress.  Will monitor.  6.  History of aortic stenosis at least moderate per recent echocardiogram.  Outpatient cardiology follow-up.  Aortic valve area is 1.34 cm and V-max is 3.27 m/s.  7.  Essential hypertension.  On beta-blocker continue.  8.  Dehydration due to diarrhea.  Diarrhea seems to have resolved.  Gentle hydration with IV fluids and monitor.  9.  CKD 3A.  Baseline creatinine around 1.4.  Monitor.  10.  Tonic iron deficiency  anemia.  No need for transfusion outpatient PCP age-appropriate work-up and follow-up.  11.  DM type II.  On sliding scale.  Georgina Pillion CBG (last 3)  Recent Labs    08/10/20 0025 08/10/20 0425  GLUCAP 150* 175*      Condition - Extremely Guarded  Family Communication  :    Skeet Latch [phone] - 08/10/20  Evorn Gong [phone] on 08/10/2020 - called twice, 9:48 AM full mailbox.  Also number listed for wife Silva Bandy [phone] has full mailbox on 08/10/2020.   Code Status :  Full  Consults  :  VVS  Procedures  :    CT Head - Non Acute  Recent TTE 05/2020 - 1. Left ventricular ejection fraction, by estimation, is 70 to 75%. The left ventricle has hyperdynamic function. The left ventricle has no regional wall motion abnormalities. There is moderate left ventricular hypertrophy. Left ventricular diastolic parameters are consistent with Grade I diastolic dysfunction (impaired relaxation).  2. Right ventricular systolic function is normal. The right ventricular size is normal.  3. The mitral valve is normal in structure. No evidence  of mitral valve regurgitation. No evidence of mitral stenosis.  4. The aortic valve has an indeterminant number of cusps. Aortic valve regurgitation is not visualized. Moderate aortic valve stenosis. Aortic valve area, by VTI measures 1.34 cm. Aortic valve mean gradient measures  23.8 mmHg. Aortic valve Vmax measures 3.27 m/s.  5. The inferior vena cava is normal in size with greater than 50% respiratory variability, suggesting right atrial pressure of 3 mmHg.    PUD Prophylaxis : PPI  Disposition Plan  :    Status is: Inpatient  Remains inpatient appropriate because:IV treatments appropriate due to intensity of illness or inability to take PO   Dispo: The patient is from: Home              Anticipated d/c is to: Home              Anticipated d/c date is: > 3 days              Patient currently is not medically stable to d/c.   DVT  Prophylaxis  :  Lovenox   Lab Results  Component Value Date   PLT 226 08/10/2020    Diet :  Diet Order            Diet NPO time specified  Diet effective now                  Inpatient Medications  Scheduled Meds: . vitamin C  500 mg Oral Daily  . aspirin  81 mg Oral Daily  . atorvastatin  20 mg Oral Daily  . brimonidine  1 drop Both Eyes BID  . clopidogrel  75 mg Oral Q breakfast  . enoxaparin (LOVENOX) injection  40 mg Subcutaneous Q24H  . ferrous sulfate  325 mg Oral BID WC  . insulin aspart  0-9 Units Subcutaneous Q4H  . [START ON 08/11/2020] methylPREDNISolone (SOLU-MEDROL) injection  40 mg Intravenous Daily  . metoprolol succinate  50 mg Oral Daily  . pantoprazole  40 mg Oral Daily  . sodium chloride flush  3 mL Intravenous Q12H  . timolol  1 drop Both Eyes BID  . zinc sulfate  220 mg Oral Daily   Continuous Infusions: . ampicillin-sulbactam (UNASYN) IV 3 g (08/10/20 0846)  . azithromycin    . [START ON 08/11/2020] remdesivir 100 mg in NS 100 mL     PRN Meds:.acetaminophen, guaiFENesin-dextromethorphan, ondansetron **OR** ondansetron (ZOFRAN) IV, traMADol  Antibiotics  :    Anti-infectives (From admission, onward)   Start     Dose/Rate Route Frequency Ordered Stop   08/11/20 1000  remdesivir 100 mg in sodium chloride 0.9 % 100 mL IVPB       "Followed by" Linked Group Details   100 mg 200 mL/hr over 30 Minutes Intravenous Daily 08/09/20 2256 08/15/20 0959   08/10/20 2000  azithromycin (ZITHROMAX) 500 mg in sodium chloride 0.9 % 250 mL IVPB        500 mg 250 mL/hr over 60 Minutes Intravenous Every 24 hours 08/09/20 2256     08/10/20 1800  cefTRIAXone (ROCEPHIN) 2 g in sodium chloride 0.9 % 100 mL IVPB  Status:  Discontinued        2 g 200 mL/hr over 30 Minutes Intravenous Every 24 hours 08/09/20 2256 08/09/20 2332   08/10/20 0015  Ampicillin-Sulbactam (UNASYN) 3 g in sodium chloride 0.9 % 100 mL IVPB        3 g 200 mL/hr over 30 Minutes Intravenous Every 8  hours 08/10/20 0002     08/10/20 0000  remdesivir 200 mg in sodium chloride 0.9% 250 mL IVPB       "Followed by" Linked Group Details   200 mg 580 mL/hr over 30 Minutes Intravenous Once 08/09/20 2256 08/10/20 0250   08/09/20 2030  cefTRIAXone (ROCEPHIN) 1 g in sodium chloride 0.9 % 100 mL IVPB        1 g 200 mL/hr over 30 Minutes Intravenous  Once 08/09/20 2020 08/09/20 2139   08/09/20 2030  azithromycin (ZITHROMAX) 500 mg in sodium chloride 0.9 % 250 mL IVPB        500 mg 250 mL/hr over 60 Minutes Intravenous  Once 08/09/20 2020 08/09/20 2140       Time Spent in minutes  30   Lala Lund M.D on 08/10/2020 at 10:10 AM  To page go to [email or web address] - password [facility]  [facility] -  Office  [phone]    See all Orders from today for further details    Objective:   Vitals:   08/10/20 0500 08/10/20 0600 08/10/20 0700 08/10/20 0800  BP: (!) 127/55 (!) 124/50 (!) 118/50 (!) 135/56  Pulse: (!) 49 (!) 50 (!) 47 (!) 48  Resp: _0 Temp:      TempSrc:      SpO2: 100% 100% 100% 100%    Wt Readings from Last 3 Encounters:  06/25/20 72.6 kg  06/19/20 72.6 kg  05/25/20 72.6 kg     Intake/Output Summary (Last 24 hours) at 08/10/2020 1010 Last data filed at 08/09/2020 2140 Gross per 24 hour  Intake 350 ml  Output --  Net 350 ml     Physical Exam  Awake Alert, No new F.N deficits, Normal affect [place].AT,PERRAL Supple Neck,No JVD, No cervical lymphadenopathy appriciated.  Symmetrical Chest wall movement, Good air movement bilaterally, few rales RRR,No Gallops,Rubs or new Murmurs, No Parasternal Heave +ve B.Sounds, Abd Soft, No tenderness, No organomegaly appriciated, No rebound - guarding or rigidity. No Cyanosis, R-AKA    Data Review:    CBC Recent Labs  Lab 08/09/20 1944 08/10/20 0320  WBC 18.2* 22.3*  HGB 9.6* 9.1*  HCT 30.5* 28.2*  PLT 231 226  MCV 85.7 85.5  MCH 27.0 27.6  MCHC 31.5 32.3  RDW 13.6 13.7  LYMPHSABS 1.4 0.9  MONOABS 1.6*  0.9  EOSABS 1.2* 0.0  BASOSABS 0.1 0.1    Recent Labs  Lab 08/09/20 1944 08/09/20 2101 08/09/20 2156 08/10/20 0033 08/10/20 0320  NA 133*  --   --   --  133*  K 4.4  --   --   --  4.1  CL 97*  --   --   --  96*  CO2 26  --   --   --  25  GLUCOSE 150*  --   --   --  173*  BUN 13  --   --   --  15  CREATININE 1.44*  --   --   --  1.46*  CALCIUM 8.9  --   --   --  8.5*  AST 18  --   --   --  19  ALT 17  --   --   --  18  ALKPHOS 91  --   --   --  87  BILITOT 0.4  --   --   --  0.5  ALBUMIN 2.6*  --   --   --  2.3*  MG  --   --   --   --  1.7  CRP  --   --  9.6*  --  13.2*  DDIMER  --   --  3.30*  --  3.06*  PROCALCITON  --   --  0.37 0.63  --   LATICACIDVEN 1.3 0.9  --   --   --   INR 1.2  --   --   --   --   HGBA1C  --   --   --  6.0*  --     ------------------------------------------------------------------------------------------------------------------ Recent Labs    08/09/20 2156  TRIG 115    Lab Results  Component Value Date   HGBA1C 6.0 (H) 08/10/2020   ------------------------------------------------------------------------------------------------------------------ No results for input(s): TSH, T4TOTAL, T3FREE, THYROIDAB in the last 72 hours.  Invalid input(s): FREET3  Cardiac Enzymes No results for input(s): CKMB, TROPONINI, MYOGLOBIN in the last 168 hours.  Invalid input(s): CK ------------------------------------------------------------------------------------------------------------------    Component Value Date/Time   BNP 333.5 (H) 06/21/2020 0250    Micro Results Recent Results (from the past 240 hour(s))  Blood culture (routine single)     Status: None (Preliminary result)   Collection Time: 08/09/20  7:28 PM   Specimen: BLOOD  Result Value Ref Range Status   Specimen Description BLOOD RIGHT ANTECUBITAL  Final   Special Requests   Final    BOTTLES DRAWN AEROBIC ONLY Blood Culture results may not be optimal due to an inadequate volume of  blood received in culture bottles   Culture   Final    NO GROWTH < 24 HOURS Performed at [facility], [place] [address] [place], [place] [address]    Report Status PENDING  Incomplete  SARS Coronavirus 2 by RT PCR (hospital order, performed in [facility] hospital lab) Nasopharyngeal Nasopharyngeal Swab     Status: Abnormal   Collection Time: 08/09/20  7:56 PM   Specimen: Nasopharyngeal Swab  Result Value Ref Range Status   SARS Coronavirus 2 POSITIVE (A) NEGATIVE Final    Comment: RESULT CALLED TO, READ BACK BY AND VERIFIED WITH: B,OSORIO _0  08/09/20 EB (NOTE) SARS-CoV-2 target nucleic acids are DETECTED  SARS-CoV-2 RNA is generally detectable in upper respiratory specimens  during the acute phase of infection.  Positive results are indicative  of the presence of the identified virus, but do not rule out bacterial infection or co-infection with other pathogens not detected by the test.  Clinical correlation with patient history and  other diagnostic information is necessary to determine patient infection status.  The expected result is negative.  Fact Sheet for Patients:   StrictlyIdeas.no   Fact Sheet for Healthcare Providers:   BankingDealers.co.za    This test is not yet approved or cleared by the [place] FDA and  has been authorized for detection and/or diagnosis of SARS-CoV-2 by FDA under an Emergency Use Authorization (EUA).  This EUA will remain in effect (meaning this test can be  used) for the duration of  the COVID-19 declaration under Section 564(b)(1) of the Act, 21 U.S.C. section 360-bbb-3(b)(1), unless the authorization is terminated or revoked sooner.  Performed at [facility], [place] [address], [place], [place] [address]     Radiology Reports CT HEAD WO CONTRAST  Result Date: 08/09/2020 CLINICAL DATA:  Delirium EXAM: CT HEAD WITHOUT CONTRAST TECHNIQUE: Contiguous axial images were obtained  from the base of the skull through the vertex without intravenous contrast. COMPARISON:  06/25/2020 FINDINGS: Brain: Normal  anatomic configuration. Parenchymal volume loss is commensurate with the patient's age. Mild-to-moderate periventricular white matter changes are present likely reflecting the sequela of small vessel ischemia. No abnormal intra or extra-axial mass lesion or fluid collection. No abnormal mass effect or midline shift. No evidence of acute intracranial hemorrhage or infarct. Ventricular size is normal. Cerebellum unremarkable. Vascular: Extensive calcifications are seen within the carotid siphons and distal vertebral arteries bilaterally. No asymmetric hyperdense vasculature at the skull base. Skull: Intact Sinuses/Orbits: The paranasal sinuses are clear. Intraorbital surgical implant is seen along the lateral aspect of the right ocular globe. The orbits are otherwise unremarkable. Other: Mastoid air cells and middle ear cavities are clear. IMPRESSION: 1. No acute intracranial abnormality. 2. Mild-to-moderate periventricular white matter changes likely reflecting the sequela of small vessel ischemia. Electronically Signed   By: Fidela Salisbury MD   On: 08/09/2020 23:46   DG Chest Port 1 View  Result Date: 08/09/2020 CLINICAL DATA:  Questionable sepsis EXAM: PORTABLE CHEST 1 VIEW COMPARISON:  Chest x-ray 06/25/2020 FINDINGS: The heart size and mediastinal contours are unchanged. Aortic arch calcifications. Interval development of hazy airspace opacity within the right lower lobe. No pulmonary edema. Similar-appearing at least small to moderate volume right pleural effusion. No left pleural effusion. No pneumothorax. No acute osseous abnormality. IMPRESSION: Stable small to moderate right pleural effusion with interval increase in right lower lobe opacity that may represent inflammation/infection versus passive atelectasis. Electronically Signed   By: Iven Finn M.D.   On: 08/09/2020 20:14    DG Foot 2 Views Left  Result Date: 08/09/2020 CLINICAL DATA:  Diabetic left ankle ulceration, EXAM: LEFT FOOT - 2 VIEW COMPARISON:  06/25/2020 FINDINGS: Frontal and lateral views of the left foot are obtained. Stable osteopenia. No acute fracture, subluxation, or dislocation. Marked joint space narrowing and osteophyte formation within the midfoot, consistent with osteoarthritis or developing Charcot arthropathy. Large inferior calcaneal spur unchanged. No radiographic evidence of osteomyelitis. There is progressive dorsal soft tissue swelling of the forefoot. IMPRESSION: 1. Progressive soft tissue swelling of the forefoot. 2. Stable degenerative changes.  No acute or destructive process. Electronically Signed   By: Randa Ngo M.D.   On: 08/09/2020 23:16   VAS [place] ABI WITH/WO TBI  Result Date: 07/20/2020 LOWER EXTREMITY DOPPLER STUDY Indications: Right AKA. High Risk Factors: Hypertension, hyperlipidemia, Diabetes.  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 Limitations: Today's exam was limited due to patient in wheelchair and Had to              lift patient to bed. Comparison Study: 05/11/20 Performing Technologist: June Leap RDMS, RVT  Examination Guidelines: A complete evaluation includes at minimum, Doppler waveform signals and systolic blood pressure reading at the level of bilateral brachial, anterior tibial, and posterior tibial arteries, when vessel segments are accessible. Bilateral testing is considered an integral part of a complete examination. Photoelectric Plethysmograph (PPG) waveforms and toe systolic pressure readings are included as required and additional duplex testing as needed. Limited examinations for reoccurring indications may be performed as noted.  ABI Findings: +--------+------------------+-----+--------+--------+  Right   Rt Pressure (mmHg)IndexWaveformComment  +--------+------------------+-----+--------+--------+ [id number]                                     +--------+------------------+-----+--------+--------+ +---------+------------------+-----+--------+----------------------------------+  Left     Lt Pressure (mmHg)IndexWaveformComment                            +---------+------------------+-----+--------+----------------------------------+ Brachial                                not obtained due to clothing                                               interference                       +---------+------------------+-----+--------+----------------------------------+ ATA      165               0.77 biphasic                                   +---------+------------------+-----+--------+----------------------------------+ PTA      188               0.87 biphasic                                   +---------+------------------+-----+--------+----------------------------------+ Great Toe103               0.48 Abnormal                                   +---------+------------------+-----+--------+----------------------------------+ +-------+-----------+-----------+------------+------------+ ABI/TBIToday's ABIToday's TBIPrevious ABIPrevious TBI +-------+-----------+-----------+------------+------------+ Left   0.87       0.48       0.68        0.43         +-------+-----------+-----------+------------+------------+   Summary: Left: 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 The left toe-brachial index is abnormal.  *See table(s) above for measurements and observations.  Electronically signed by Servando Snare MD on 07/20/2020 at 9:52:26 AM.    Final

## 2020-08-10 NOTE — Progress Notes (Signed)
MD spoke with pt today about poor healing on L foot, but pt has declined amputation for now.  He is weaker, and will need to be in a rehab setting to strengthen and get the balance control he needs for sliding board transfers.  Pt additionally needs to be able to use RLE prosthesis potentially, and so will recommend SNF to follow up for a length of time with all his needs.  08/10/20 1700  PT Visit Information  Last PT Received On 08/10/20  Assistance Needed +1 (2 for transfer to chair initially)  History of Present Illness 74 yo male with L foot pain was found to be Covid positive.  Pt had L foot revascularization done 06/20/20.  Note a heel wound as well on LLE.  PMHx:  R AKA, DM, PVD, HTN, atherosclerosis, C-diff, gout, PNA, sleep apnea,   Precautions  Precautions Fall  Precaution Comments R AKA  Required Braces or Orthoses Other Brace  Other Brace RLE prosthesis  Restrictions  Weight Bearing Restrictions No  Home Living  Family/patient expects to be discharged to: Private residence  Living Arrangements Alone;Non-relatives/Friends  Available Help at Discharge Personal care attendant;Available PRN/intermittently  Type of Home House  Home Access Ramped entrance  Home Layout One level  Bathroom Shower/Tub Tub/shower unit  Geophysicist/field seismologist - manual;BSC;Shower seat;Walker - 2 wheels;Hospital bed  Additional Comments has sliding board; wife is bil amputee but has family that assist as needed  Prior Function  Level of Independence Needs assistance  Gait / Transfers Assistance Needed Did sliding board with assist to set up; uses manual w/c  ADL's / Homemaking Assistance Needed Reports had assistance with sponge baths but could do toileting and dressing  Communication  Communication No difficulties  Pain Assessment  Pain Assessment Faces  Faces Pain Scale 4  Pain Location LLE  Pain Descriptors / Indicators Guarding;Grimacing  Pain Intervention(s) Limited  activity within patient's tolerance;Repositioned;Monitored during session  Cognition  Arousal/Alertness Awake/alert  Behavior During Therapy Anxious  Overall Cognitive Status Within Functional Limits for tasks assessed  General Comments pt is perseverating on some thoughts, such as with details of his care  Upper Extremity Assessment  Upper Extremity Assessment Overall WFL for tasks assessed  Lower Extremity Assessment  Lower Extremity Assessment Generalized weakness  Cervical / Trunk Assessment  Cervical / Trunk Assessment Kyphotic  Bed Mobility  Overal bed mobility Needs Assistance  Bed Mobility Supine to Sit;Sit to Supine;Rolling  Rolling Mod assist  Supine to sit Mod assist  Sit to supine Mod assist  General bed mobility comments mod assist to initiate all trunk movement, specifically to move to L side  Transfers  Overall transfer level Needs assistance  Equipment used None  General transfer comment unable to stand but previously stood to transfer to chair alone  Ambulation/Gait  General Gait Details pt has not been ambulatory recently  Balance  Overall balance assessment Needs assistance  Sitting-balance support Bilateral upper extremity supported;Single extremity supported  Sitting balance-Leahy Scale Poor  Standing balance-Leahy Scale Zero  General Comments  General comments (skin integrity, edema, etc.) pt is up to side of bed but cannot get him to chair due to pain on LLE and general weakness  Exercises  Exercises Other exercises (LLE is 3+ strength)  PT - End of Session  Activity Tolerance Patient limited by fatigue;Treatment limited secondary to medical complications (Comment)  Patient left in bed;with call bell/phone within reach  Nurse Communication Mobility status  PT Assessment  PT Recommendation/Assessment Patient needs continued PT services  PT Visit Diagnosis Other abnormalities of gait and mobility (R26.89);Muscle weakness (generalized) (M62.81);Difficulty  in walking, not elsewhere classified (R26.2);Adult, failure to thrive (R62.7)  PT Problem List Decreased strength;Decreased range of motion;Decreased activity tolerance;Decreased balance;Decreased mobility;Decreased coordination;Decreased knowledge of use of DME  Barriers to Discharge Decreased caregiver support  Barriers to Discharge Comments has one caregiver part of the time to assist  PT Plan  PT Frequency (ACUTE ONLY) Min 3X/week  PT Treatment/Interventions (ACUTE ONLY) DME instruction;Functional mobility training;Therapeutic activities;Therapeutic exercise;Balance training;Neuromuscular re-education;Patient/family education  AM-PAC PT "6 Clicks" Mobility Outcome Measure (Version 2)  Help needed turning from your back to your side while in a flat bed without using bedrails? 2  Help needed moving from lying on your back to sitting on the side of a flat bed without using bedrails? 2  Help needed moving to and from a bed to a chair (including a wheelchair)? 1  Help needed standing up from a chair using your arms (e.g., wheelchair or bedside chair)? 1  Help needed to walk in hospital room? 1  Help needed climbing 3-5 steps with a railing?  1  6 Click Score 8  Consider Recommendation of Discharge To: CIR/SNF/LTACH  PT Recommendation  Follow Up Recommendations SNF  PT equipment None recommended by PT  Individuals Consulted  Consulted and Agree with Results and Recommendations Patient  Acute Rehab PT Goals  Patient Stated Goal to get stronger and have enough help  PT Goal Formulation With patient  Time For Goal Achievement 08/24/20  Potential to Achieve Goals Good  PT Time Calculation  PT Start Time (ACUTE ONLY) 1410  PT Stop Time (ACUTE ONLY) 1436  PT Time Calculation (min) (ACUTE ONLY) 26 min  PT General Charges  $$ ACUTE PT VISIT 1 Visit  PT Evaluation  $PT Eval Moderate Complexity 1 Mod  PT Treatments  $Therapeutic Activity 8-22 mins  Written Expression  Dominant Hand Right     Samul Dada, PT MS Acute Rehab Dept. Number: [facility] [id number] and [facility] [phone]

## 2020-08-11 ENCOUNTER — Inpatient Hospital Stay (HOSPITAL_COMMUNITY): Payer: Medicare Other

## 2020-08-11 DIAGNOSIS — U071 COVID-19: Secondary | ICD-10-CM

## 2020-08-11 DIAGNOSIS — R7989 Other specified abnormal findings of blood chemistry: Secondary | ICD-10-CM

## 2020-08-11 LAB — CBC WITH DIFFERENTIAL/PLATELET
Abs Immature Granulocytes: 0.09 10*3/uL — ABNORMAL HIGH (ref 0.00–0.07)
Basophils Absolute: 0 10*3/uL (ref 0.0–0.1)
Basophils Relative: 0 %
Eosinophils Absolute: 0 10*3/uL (ref 0.0–0.5)
Eosinophils Relative: 0 %
HCT: 23.8 % — ABNORMAL LOW (ref 39.0–52.0)
Hemoglobin: 7.6 g/dL — ABNORMAL LOW (ref 13.0–17.0)
Immature Granulocytes: 1 %
Lymphocytes Relative: 10 %
Lymphs Abs: 1.6 10*3/uL (ref 0.7–4.0)
MCH: 26.8 pg (ref 26.0–34.0)
MCHC: 31.9 g/dL (ref 30.0–36.0)
MCV: 83.8 fL (ref 80.0–100.0)
Monocytes Absolute: 1.5 10*3/uL — ABNORMAL HIGH (ref 0.1–1.0)
Monocytes Relative: 9 %
Neutro Abs: 13.1 10*3/uL — ABNORMAL HIGH (ref 1.7–7.7)
Neutrophils Relative %: 80 %
Platelets: 206 10*3/uL (ref 150–400)
RBC: 2.84 MIL/uL — ABNORMAL LOW (ref 4.22–5.81)
RDW: 13.8 % (ref 11.5–15.5)
WBC: 16.4 10*3/uL — ABNORMAL HIGH (ref 4.0–10.5)
nRBC: 0 % (ref 0.0–0.2)

## 2020-08-11 LAB — COMPREHENSIVE METABOLIC PANEL
ALT: 17 U/L (ref 0–44)
AST: 23 U/L (ref 15–41)
Albumin: 1.9 g/dL — ABNORMAL LOW (ref 3.5–5.0)
Alkaline Phosphatase: 67 U/L (ref 38–126)
Anion gap: 10 (ref 5–15)
BUN: 29 mg/dL — ABNORMAL HIGH (ref 8–23)
CO2: 25 mmol/L (ref 22–32)
Calcium: 8.1 mg/dL — ABNORMAL LOW (ref 8.9–10.3)
Chloride: 100 mmol/L (ref 98–111)
Creatinine, Ser: 1.45 mg/dL — ABNORMAL HIGH (ref 0.61–1.24)
GFR calc Af Amer: 54 mL/min — ABNORMAL LOW (ref >60)
GFR calc non Af Amer: 46 mL/min — ABNORMAL LOW (ref >60)
Glucose, Bld: 121 mg/dL — ABNORMAL HIGH (ref 70–99)
Potassium: 4 mmol/L (ref 3.5–5.1)
Sodium: 135 mmol/L (ref 135–145)
Total Bilirubin: 0.5 mg/dL (ref 0.3–1.2)
Total Protein: 6.5 g/dL (ref 6.5–8.1)

## 2020-08-11 LAB — TYPE AND SCREEN
ABO/RH(D): B POS
Antibody Screen: NEGATIVE

## 2020-08-11 LAB — GLUCOSE, CAPILLARY
Glucose-Capillary: 127 mg/dL — ABNORMAL HIGH (ref 70–99)
Glucose-Capillary: 168 mg/dL — ABNORMAL HIGH (ref 70–99)
Glucose-Capillary: 171 mg/dL — ABNORMAL HIGH (ref 70–99)
Glucose-Capillary: 179 mg/dL — ABNORMAL HIGH (ref 70–99)
Glucose-Capillary: 197 mg/dL — ABNORMAL HIGH (ref 70–99)
Glucose-Capillary: 99 mg/dL (ref 70–99)

## 2020-08-11 LAB — MAGNESIUM: Magnesium: 1.9 mg/dL (ref 1.7–2.4)

## 2020-08-11 LAB — PROCALCITONIN: Procalcitonin: 1.34 ng/mL

## 2020-08-11 LAB — MRSA PCR SCREENING: MRSA by PCR: NEGATIVE

## 2020-08-11 LAB — D-DIMER, QUANTITATIVE: D-Dimer, Quant: 1.84 ug/mL-FEU — ABNORMAL HIGH (ref 0.00–0.50)

## 2020-08-11 LAB — BRAIN NATRIURETIC PEPTIDE: B Natriuretic Peptide: 380.7 pg/mL — ABNORMAL HIGH (ref 0.0–100.0)

## 2020-08-11 LAB — C-REACTIVE PROTEIN: CRP: 17.9 mg/dL — ABNORMAL HIGH (ref <1.0)

## 2020-08-11 MED ORDER — METOPROLOL SUCCINATE ER 25 MG PO TB24
25.0000 mg | ORAL_TABLET | Freq: Every day | ORAL | Status: DC
  Administered 2020-08-12 – 2020-08-18 (×7): 25 mg via ORAL
  Filled 2020-08-11 (×7): qty 1

## 2020-08-11 MED ORDER — METHYLPREDNISOLONE SODIUM SUCC 40 MG IJ SOLR
20.0000 mg | Freq: Every day | INTRAMUSCULAR | Status: AC
  Administered 2020-08-12 – 2020-08-13 (×2): 20 mg via INTRAVENOUS
  Filled 2020-08-11 (×2): qty 1

## 2020-08-11 MED ORDER — ENSURE ENLIVE PO LIQD
237.0000 mL | Freq: Two times a day (BID) | ORAL | Status: DC
  Administered 2020-08-12 – 2020-08-15 (×7): 237 mL via ORAL

## 2020-08-11 MED ORDER — JUVEN PO PACK
1.0000 | PACK | Freq: Two times a day (BID) | ORAL | Status: DC
  Administered 2020-08-12 – 2020-08-15 (×7): 1 via ORAL
  Filled 2020-08-11 (×7): qty 1

## 2020-08-11 NOTE — Progress Notes (Signed)
PROGRESS NOTE                                                                                                                                                                                                             Patient Demographics:    Jeremiah [name], is a 74 y.o. male, DOB - 30-Sep-1946, OVZ:[id number]  Outpatient Primary MD for the patient is Clovia Cuff, MD    LOS - 2  Admit date - 08/09/2020    Chief Complaint  Patient presents with  . Code Sepsis       Brief Narrative - 74 y.o. male with medical history significant of PVD  Sp Left SFA stent, HTN, iron deficiency anemia, [name], [name], chronic diastolic CHF, aortic stenosis, [name], [name], [name], [name], apparently has had diarrhea for the last several weeks, upon arrival to the ER he was found to have possible COVID-19 pneumonia versus aspiration pneumonia along with dehydration and metabolic encephalopathy and he was admitted to the hospital.  He is fully vaccinated for COVID-19.   Subjective:   Patient in bed, appears comfortable, denies any headache, no fever, no chest pain or pressure, no shortness of breath , no abdominal pain. No focal weakness.   Assessment  & Plan :     1. Incidental COVID-19 infection in a patient was fully vaccinated.- I do not think this is causing any issues will monitor for now.  He was started on IV steroids and Remdesivir upon admission will continue cautiously although I do not think he has active COVID-19 pneumonia.  Will taper off steroids quickly.  Encouraged the patient to sit up in chair in the daytime use I-S and flutter valve for pulmonary toiletry and then prone in bed when at night.  Will advance activity and titrate down oxygen as possible.   SpO2: 97 % O2 Flow Rate (L/min): 2 L/min  Recent Labs  Lab 08/09/20 1944 08/09/20 1956 08/09/20 2101  08/09/20 2156 08/10/20 0033 08/10/20 0320 08/10/20 1453 08/11/20 0214  WBC 18.2*  --   --   --   --  22.3*  --  16.4*  PLT 231  --   --   --   --  226  --  206  CRP  --   --   --  9.6*  --  13.2*  --  17.9*  BNP  --   --   --   --   --   --  400.1* 380.7*  DDIMER  --   --   --  3.30*  --  3.06*  --  1.84*  PROCALCITON  --   --   --  0.37 0.63 0.87  --  1.34  AST 18  --   --   --   --  19  --  23  ALT 17  --   --   --   --  18  --  17  ALKPHOS 91  --   --   --   --  87  --  67  BILITOT 0.4  --   --   --   --  0.5  --  0.5  ALBUMIN 2.6*  --   --   --   --  2.3*  --  1.9*  INR 1.2  --   --   --   --   --   --   --   LATICACIDVEN 1.3  --  0.9  --   --   --   --   --   SARSCOV2NAA  --  POSITIVE*  --   --   --   --   --   --       2. PAD with R AKA, recent L. SMA stent with Chronic left heel ulcer present on admission.  Wound care on board, continue on dual antiplatelet therapy along with statin for secondary prevention, he follows with Dr. Donzetta Matters, wound care has been consulted, since his ESR is extremely elevated, seen by vascular surgery this admission appreciate their input.  They have persistently recommended left leg amputation but patient has been resistant and still wants wound care, wound care on board, defer management of this problem to vascular surgery team, updated son [name] on 08/11/2020.  3.  Aspiration pneumonia.  Agree with Unasyn and azithromycin for now continue.  Proved on present antibiotics, MRSA PCR negative, speech following, clinically improved on 08/11/2020 will continue to monitor.   4.  Metabolic encephalopathy upon admission.  Likely due to aspiration pneumonia and chronic inflammation from his left heel ulcer, improved with supportive care, CT head nonacute.  5.  Mild hypoxia upon admission.  Currently 100% on 2 L and in no distress.  Will monitor.  Likely due to aspiration pneumonia.  6.  History of aortic stenosis at least moderate per recent echocardiogram.   Outpatient cardiology follow-up.  Aortic valve area is 1.34 cm and V-max is 3.27 m/s.  Continue beta-blocker.  Avoid sudden preload reduction.  7.  Essential hypertension.  On beta-blocker continue.  8.  Dehydration due to diarrhea.  Diarrhea seems to have resolved. Improved after IV fluids.  9.  CKD 3A.  Baseline creatinine around 1.4.  Monitor.  10. Chronic iron deficiency anemia.  No need for transfusion outpatient PCP age-appropriate work-up and follow-up.  11.  Elevated D-dimer.  Likely due to infection and inflammation however will obtain lower extremity venous duplex to rule out DVT in the left lower extremity and in the right upper leg.   12. DM type II.  On sliding scale.  .lasya CBG (last 3)  Recent Labs    08/10/20 2336 08/11/20 0336 08/11/20 0728  GLUCAP 153* 99 127*      Condition -  Guarded  Family Communication  :  Son Nelda Bucks [phone] - 08/10/20  Evorn Gong [phone] on 08/10/2020 - called twice, 9:48 AM full mailbox.  Updated on 08/11/20  Also number listed for wife Silva Bandy [phone] has full mailbox on 08/10/2020.   Code Status :  Full  Consults  :  VVS  Procedures  :    CT Head - Non Acute  Leg [place]  Recent TTE 05/2020 - 1. Left ventricular ejection fraction, by estimation, is 70 to 75%. The left ventricle has hyperdynamic function. The left ventricle has no regional wall motion abnormalities. There is moderate left ventricular hypertrophy. Left ventricular diastolic parameters are consistent with Grade I diastolic dysfunction (impaired relaxation).  2. Right ventricular systolic function is normal. The right ventricular size is normal.  3. The mitral valve is normal in structure. No evidence of mitral valve regurgitation. No evidence of mitral stenosis.  4. The aortic valve has an indeterminant number of cusps. Aortic valve regurgitation is not visualized. Moderate aortic valve stenosis. Aortic valve area, by VTI measures 1.34 cm.  Aortic valve mean gradient measures  23.8 mmHg. Aortic valve Vmax measures 3.27 m/s.  5. The inferior vena cava is normal in size with greater than 50% respiratory variability, suggesting right atrial pressure of 3 mmHg.    PUD Prophylaxis : PPI  Disposition Plan  :    Status is: Inpatient  Remains inpatient appropriate because:IV treatments appropriate due to intensity of illness or inability to take PO   Dispo: The patient is from: Home              Anticipated d/c is to: Home              Anticipated d/c date is: > 3 days              Patient currently is not medically stable to d/c.   DVT Prophylaxis  :  Lovenox   Lab Results  Component Value Date   PLT 206 08/11/2020    Diet :  Diet Order            Diet Carb Modified Fluid consistency: Thin; Room service appropriate? Yes with Assist  Diet effective now                  Inpatient Medications  Scheduled Meds: . vitamin C  500 mg Oral Daily  . aspirin  81 mg Oral Daily  . atorvastatin  20 mg Oral Daily  . brimonidine  1 drop Both Eyes BID  . clopidogrel  75 mg Oral Q breakfast  . enoxaparin (LOVENOX) injection  40 mg Subcutaneous Q24H  . ferrous sulfate  325 mg Oral BID WC  . insulin aspart  0-9 Units Subcutaneous Q4H  . methylPREDNISolone (SOLU-MEDROL) injection  40 mg Intravenous Daily  . metoprolol succinate  50 mg Oral Daily  . pantoprazole  40 mg Oral Daily  . sodium chloride flush  3 mL Intravenous Q12H  . timolol  1 drop Both Eyes BID  . zinc sulfate  220 mg Oral Daily   Continuous Infusions: . ampicillin-sulbactam (UNASYN) IV 3 g (08/11/20 0813)  . azithromycin Stopped (08/10/20 2255)  . remdesivir 100 mg in NS 100 mL 100 mg (08/11/20 0832)   PRN Meds:.acetaminophen, guaiFENesin-dextromethorphan, [DISCONTINUED] ondansetron **OR** ondansetron (ZOFRAN) IV, traMADol  Antibiotics  :    Anti-infectives (From admission, onward)   Start     Dose/Rate Route Frequency Ordered Stop   08/11/20 1000   remdesivir 100 mg in sodium chloride 0.9 %  100 mL IVPB       "Followed by" Linked Group Details   100 mg 200 mL/hr over 30 Minutes Intravenous Daily 08/09/20 2256 08/15/20 0959   08/10/20 2000  azithromycin (ZITHROMAX) 500 mg in sodium chloride 0.9 % 250 mL IVPB        500 mg 250 mL/hr over 60 Minutes Intravenous Every 24 hours 08/09/20 2256     08/10/20 1800  cefTRIAXone (ROCEPHIN) 2 g in sodium chloride 0.9 % 100 mL IVPB  Status:  Discontinued        2 g 200 mL/hr over 30 Minutes Intravenous Every 24 hours 08/09/20 2256 08/09/20 2332   08/10/20 0015  Ampicillin-Sulbactam (UNASYN) 3 g in sodium chloride 0.9 % 100 mL IVPB        3 g 200 mL/hr over 30 Minutes Intravenous Every 8 hours 08/10/20 0002     08/10/20 0000  remdesivir 200 mg in sodium chloride 0.9% 250 mL IVPB       "Followed by" Linked Group Details   200 mg 580 mL/hr over 30 Minutes Intravenous Once 08/09/20 2256 08/10/20 0250   08/09/20 2030  cefTRIAXone (ROCEPHIN) 1 g in sodium chloride 0.9 % 100 mL IVPB        1 g 200 mL/hr over 30 Minutes Intravenous  Once 08/09/20 2020 08/09/20 2139   08/09/20 2030  azithromycin (ZITHROMAX) 500 mg in sodium chloride 0.9 % 250 mL IVPB        500 mg 250 mL/hr over 60 Minutes Intravenous  Once 08/09/20 2020 08/09/20 2140       Time Spent in minutes  30   Lala Lund M.D on 08/11/2020 at 8:43 AM  To page go to [email or web address] - password [place]  [facility] -  Office  [phone]    See all Orders from today for further details    Objective:   Vitals:   08/10/20 2130 08/10/20 2326 08/11/20 0327 08/11/20 0731  BP: (!) 144/62 (!) 146/65 129/60 (!) 142/57  Pulse: 60 (!) 57 (!) 47 (!) 51  Resp: _0 Temp: 98.4 F (36.9 C) 97.8 F (36.6 C) 98 F (36.7 C) 98.1 F (36.7 C)  TempSrc: Oral Oral Oral Oral  SpO2: 97% 97% 97% 97%  Weight:      Height:        Wt Readings from Last 3 Encounters:  08/10/20 61.6 kg  06/25/20 72.6 kg  06/19/20 72.6 kg      Intake/Output Summary (Last 24 hours) at 08/11/2020 0843 Last data filed at 08/10/2020 0916 Gross per 24 hour  Intake 100 ml  Output --  Net 100 ml     Physical Exam  Awake more Alert, No new F.N deficits,   [place].AT,PERRAL Supple Neck,No JVD, No cervical lymphadenopathy appriciated.  Symmetrical Chest wall movement, Good air movement bilaterally, CTAB RRR,No Gallops, Rubs or new Murmurs, No Parasternal Heave +ve B.Sounds, Abd Soft, No tenderness, No organomegaly appriciated, No rebound - guarding or rigidity. R-AKA, L heel old ulcer POA    Data Review:    CBC Recent Labs  Lab 08/09/20 1944 08/10/20 0320 08/10/20 1507 08/11/20 0214  WBC 18.2* 22.3*  --  16.4*  HGB 9.6* 9.1* 8.8* 7.6*  HCT 30.5* 28.2* 26.0* 23.8*  PLT 231 226  --  206  MCV 85.7 85.5  --  83.8  MCH 27.0 27.6  --  26.8  MCHC 31.5 32.3  --  31.9  RDW 13.6 13.7  --  13.8  LYMPHSABS 1.4 0.9  --  1.6  MONOABS 1.6* 0.9  --  1.5*  EOSABS 1.2* 0.0  --  0.0  BASOSABS 0.1 0.1  --  0.0    Recent Labs  Lab 08/09/20 1944 08/09/20 2101 08/09/20 2156 08/10/20 0033 08/10/20 0320 08/10/20 1453 08/10/20 1507 08/11/20 0214  NA 133*  --   --   --  133*  --  136 135  K 4.4  --   --   --  4.1  --  4.0 4.0  CL 97*  --   --   --  96*  --   --  100  CO2 26  --   --   --  25  --   --  25  GLUCOSE 150*  --   --   --  173*  --   --  121*  BUN 13  --   --   --  15  --   --  29*  CREATININE 1.44*  --   --   --  1.46*  --   --  1.45*  CALCIUM 8.9  --   --   --  8.5*  --   --  8.1*  AST 18  --   --   --  19  --   --  23  ALT 17  --   --   --  18  --   --  17  ALKPHOS 91  --   --   --  87  --   --  67  BILITOT 0.4  --   --   --  0.5  --   --  0.5  ALBUMIN 2.6*  --   --   --  2.3*  --   --  1.9*  MG  --   --   --   --  1.7  --   --  1.9  CRP  --   --  9.6*  --  13.2*  --   --  17.9*  DDIMER  --   --  3.30*  --  3.06*  --   --  1.84*  PROCALCITON  --   --  0.37 0.63 0.87  --   --  1.34  LATICACIDVEN 1.3 0.9  --    --   --   --   --   --   INR 1.2  --   --   --   --   --   --   --   HGBA1C  --   --   --  6.0*  --   --   --   --   BNP  --   --   --   --   --  400.1*  --  380.7*    ------------------------------------------------------------------------------------------------------------------ Recent Labs    08/09/20 2156  TRIG 115    Lab Results  Component Value Date   HGBA1C 6.0 (H) 08/10/2020   ------------------------------------------------------------------------------------------------------------------ No results for input(s): TSH, T4TOTAL, T3FREE, THYROIDAB in the last 72 hours.  Invalid input(s): FREET3  Cardiac Enzymes No results for input(s): CKMB, TROPONINI, MYOGLOBIN in the last 168 hours.  Invalid input(s): CK ------------------------------------------------------------------------------------------------------------------    Component Value Date/Time   BNP 380.7 (H) 08/11/2020 0214    Micro Results Recent Results (from the past 240 hour(s))  Blood culture (routine single)     Status: None (Preliminary result)   Collection Time: 08/09/20  7:28 PM  Specimen: BLOOD  Result Value Ref Range Status   Specimen Description BLOOD RIGHT ANTECUBITAL  Final   Special Requests   Final    BOTTLES DRAWN AEROBIC ONLY Blood Culture results may not be optimal due to an inadequate volume of blood received in culture bottles   Culture   Final    NO GROWTH < 24 HOURS Performed at [facility] [address], [place], [place] [address]    Report Status PENDING  Incomplete  SARS Coronavirus 2 by RT PCR (hospital order, performed in [facility] hospital lab) Nasopharyngeal Nasopharyngeal Swab     Status: Abnormal   Collection Time: 08/09/20  7:56 PM   Specimen: Nasopharyngeal Swab  Result Value Ref Range Status   SARS Coronavirus 2 POSITIVE (A) NEGATIVE Final    Comment: RESULT CALLED TO, READ BACK BY AND VERIFIED WITH: B,OSORIO _0  08/09/20 EB (NOTE) SARS-CoV-2 target  nucleic acids are DETECTED  SARS-CoV-2 RNA is generally detectable in upper respiratory specimens  during the acute phase of infection.  Positive results are indicative  of the presence of the identified virus, but do not rule out bacterial infection or co-infection with other pathogens not detected by the test.  Clinical correlation with patient history and  other diagnostic information is necessary to determine patient infection status.  The expected result is negative.  Fact Sheet for Patients:   StrictlyIdeas.no   Fact Sheet for Healthcare Providers:   BankingDealers.co.za    This test is not yet approved or cleared by the [place] FDA and  has been authorized for detection and/or diagnosis of SARS-CoV-2 by FDA under an Emergency Use Authorization (EUA).  This EUA will remain in effect (meaning this test can be  used) for the duration of  the COVID-19 declaration under Section 564(b)(1) of the Act, 21 U.S.C. section 360-bbb-3(b)(1), unless the authorization is terminated or revoked sooner.  Performed at [facility], [place] [address] [place], [place] [address]   Urine culture     Status: None   Collection Time: 08/09/20 10:26 PM   Specimen: In/Out Cath Urine  Result Value Ref Range Status   Specimen Description IN/OUT CATH URINE  Final   Special Requests NONE  Final   Culture   Final    NO GROWTH Performed at [facility], [place] [address] [place], [place] [address]    Report Status 08/10/2020 FINAL  Final  MRSA PCR Screening     Status: None   Collection Time: 08/10/20 10:00 PM   Specimen: Nasal Mucosa; Nasopharyngeal  Result Value Ref Range Status   MRSA by PCR NEGATIVE NEGATIVE Final    Comment:        The GeneXpert MRSA Assay (FDA approved for NASAL specimens only), is one component of a comprehensive MRSA colonization surveillance program. It is not intended to diagnose MRSA infection nor to guide  or monitor treatment for MRSA infections. Performed at [facility], [place] [address], [place], [place] [address]     Radiology Reports CT HEAD WO CONTRAST  Result Date: 08/09/2020 CLINICAL DATA:  Delirium EXAM: CT HEAD WITHOUT CONTRAST TECHNIQUE: Contiguous axial images were obtained from the base of the skull through the vertex without intravenous contrast. COMPARISON:  06/25/2020 FINDINGS: Brain: Normal anatomic configuration. Parenchymal volume loss is commensurate with the patient's age. Mild-to-moderate periventricular white matter changes are present likely reflecting the sequela of small vessel ischemia. No abnormal intra or extra-axial mass lesion or fluid collection. No  abnormal mass effect or midline shift. No evidence of acute intracranial hemorrhage or infarct. Ventricular size is normal. Cerebellum unremarkable. Vascular: Extensive calcifications are seen within the carotid siphons and distal vertebral arteries bilaterally. No asymmetric hyperdense vasculature at the skull base. Skull: Intact Sinuses/Orbits: The paranasal sinuses are clear. Intraorbital surgical implant is seen along the lateral aspect of the right ocular globe. The orbits are otherwise unremarkable. Other: Mastoid air cells and middle ear cavities are clear. IMPRESSION: 1. No acute intracranial abnormality. 2. Mild-to-moderate periventricular white matter changes likely reflecting the sequela of small vessel ischemia. Electronically Signed   By: Fidela Salisbury MD   On: 08/09/2020 23:46   CT ANGIO CHEST PE W OR WO CONTRAST  Result Date: 08/10/2020 CLINICAL DATA:  Altered mental status with cough and diarrhea for 1 week. Pulmonary embolism suspected. EXAM: CT ANGIOGRAPHY CHEST WITH CONTRAST TECHNIQUE: Multidetector CT imaging of the chest was performed using the standard protocol during bolus administration of intravenous contrast. Multiplanar CT image reconstructions and MIPs were obtained to evaluate the vascular  anatomy. CONTRAST:  55m OMNIPAQUE IOHEXOL 350 MG/ML SOLN COMPARISON:  Radiographs 08/09/2020. Noncontrast chest CT 11/05/2018. FINDINGS: Cardiovascular: The pulmonary arteries are well opacified with contrast to the level of the subsegmental branches. There is no evidence of acute pulmonary embolism. There is a possible small linear filling defect within the right lower lobe segmental pulmonary artery (image 51/5) which could relate to previous pulmonary embolism. There is limited luminal opacification of the systemic vessels without evidence of acute findings. There is diffuse atherosclerosis of the aorta, great vessels and coronary arteries. Aortic valvular calcifications are present. The heart size is normal. There is no pericardial effusion. Mediastinum/Nodes: There are no enlarged mediastinal, hilar or axillary lymph nodes. The thyroid gland, trachea and esophagus demonstrate no significant findings. Lungs/Pleura: Chronic dependent right pleural effusion is associated with pleural thickening and has mildly progressed compared with the prior CT. There is resulting increased compressive atelectasis of the right middle and lower lobes. The lower lobe is completely collapsed, and there is abrupt cut off of the right lower and middle lobe bronchi proximally. Right middle lobe atelectasis may obscure the previously demonstrated right middle lobe nodular density. There is new patchy right upper lobe airspace disease, suspicious for pneumonia. The left lung is clear. There are small calcified pleural plaques bilaterally. Upper abdomen: The visualized upper abdomen appears stable without acute findings. Musculoskeletal/Chest wall: There is no chest wall mass or suspicious osseous finding. Review of the MIP images confirms the above findings. IMPRESSION: 1. No evidence of acute pulmonary embolism. Possible small linear filling defect within the right lower lobe segmental pulmonary artery could relate to previous  pulmonary embolism. 2. Enlarging chronic right pleural effusion with increased compressive atelectasis of the right middle and lower lobes and associated central bronchial occlusion. Underlying neoplasm cannot be excluded. Further evaluation recommended with thoracentesis and possible bronchoscopy. 3. New patchy right upper lobe airspace disease, suspicious for pneumonia. Radiographic follow up recommended. 4. 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 5. Aortic Atherosclerosis (ICD10-I70.0). Electronically Signed   By: WRichardean SaleM.D.   On: 08/10/2020 11:50   DG Chest Port 1 View  Result Date: 08/09/2020 CLINICAL DATA:  Questionable sepsis EXAM: PORTABLE CHEST 1 VIEW COMPARISON:  Chest x-ray 06/25/2020 FINDINGS: The heart size and mediastinal contours are unchanged. Aortic arch calcifications. Interval development of hazy airspace opacity within the right lower lobe. No pulmonary edema. Similar-appearing at least small to moderate volume right  pleural effusion. No left pleural effusion. No pneumothorax. No acute osseous abnormality. IMPRESSION: Stable small to moderate right pleural effusion with interval increase in right lower lobe opacity that may represent inflammation/infection versus passive atelectasis. Electronically Signed   By: Iven Finn M.D.   On: 08/09/2020 20:14   DG Foot 2 Views Left  Result Date: 08/09/2020 CLINICAL DATA:  Diabetic left ankle ulceration, EXAM: LEFT FOOT - 2 VIEW COMPARISON:  06/25/2020 FINDINGS: Frontal and lateral views of the left foot are obtained. Stable osteopenia. No acute fracture, subluxation, or dislocation. Marked joint space narrowing and osteophyte formation within the midfoot, consistent with osteoarthritis or developing Charcot arthropathy. Large inferior calcaneal spur unchanged. No radiographic evidence of osteomyelitis. There is progressive dorsal soft tissue swelling of the forefoot. IMPRESSION: 1. Progressive  soft tissue swelling of the forefoot. 2. Stable degenerative changes.  No acute or destructive process. Electronically Signed   By: Randa Ngo M.D.   On: 08/09/2020 23:16   VAS [place] ABI WITH/WO TBI  Result Date: 07/20/2020 LOWER EXTREMITY DOPPLER STUDY Indications: Right AKA. High Risk Factors: Hypertension, hyperlipidemia, Diabetes.  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 Limitations: Today's exam was limited due to patient in wheelchair and Had to              lift patient to bed. Comparison Study: 05/11/20 Performing Technologist: June Leap RDMS, RVT  Examination Guidelines: A complete evaluation includes at minimum, Doppler waveform signals and systolic blood pressure reading at the level of bilateral brachial, anterior tibial, and posterior tibial arteries, when vessel segments are accessible. Bilateral testing is considered an integral part of a complete examination. Photoelectric Plethysmograph (PPG) waveforms and toe systolic pressure readings are included as required and additional duplex testing as needed. Limited examinations for reoccurring indications may be performed as noted.  ABI Findings: +--------+------------------+-----+--------+--------+ Right   Rt Pressure (mmHg)IndexWaveformComment  +--------+------------------+-----+--------+--------+ [id number]                                     +--------+------------------+-----+--------+--------+ +---------+------------------+-----+--------+----------------------------------+ Left     Lt Pressure (mmHg)IndexWaveformComment                            +---------+------------------+-----+--------+----------------------------------+ Brachial                                not obtained due to clothing                                               interference                        +---------+------------------+-----+--------+----------------------------------+ ATA      165               0.77 biphasic                                   +---------+------------------+-----+--------+----------------------------------+  PTA      188               0.87 biphasic                                   +---------+------------------+-----+--------+----------------------------------+ Great Toe103               0.48 Abnormal                                   +---------+------------------+-----+--------+----------------------------------+ +-------+-----------+-----------+------------+------------+ ABI/TBIToday's ABIToday's TBIPrevious ABIPrevious TBI +-------+-----------+-----------+------------+------------+ Left   0.87       0.48       0.68        0.43         +-------+-----------+-----------+------------+------------+   Summary: Left: 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 The left toe-brachial index is abnormal.  *See table(s) above for measurements and observations.  Electronically signed by Servando Snare MD on 07/20/2020 at 9:52:26 AM.    Final

## 2020-08-11 NOTE — Progress Notes (Signed)
VASCULAR LAB    Bilateral lower extremity venous duplex has been performed.  See CV proc for preliminary results.   Honorio Devol, RVT 08/11/2020, 3:04 PM

## 2020-08-11 NOTE — Progress Notes (Signed)
OT Cancellation Note  Patient Details Name: Jeremiah [name] MRN: [id number] DOB: 1946/09/27   Cancelled Treatment:    Reason Eval/Treat Not Completed: Other (comment) (Pt reporting he is very tired and has been awake since 4am because his family keeps calling as his wife is in the ICU. Requesting to do therapy tomorrow. Provided education on importance of OOB activity and mobility with COVID. Will return as schedule allows. )  Jeremiah [name] [name] MSOT, OTR/L Acute Rehab Pager: [phone] Office: [phone] 08/11/2020, 4:36 PM

## 2020-08-11 NOTE — Progress Notes (Signed)
Initial Nutrition Assessment  DOCUMENTATION CODES:   Not applicable  INTERVENTION:  Ensure Enlive po BID, each supplement provides 350 kcal and 20 grams of protein  Liberalize diet  MVI with minerals daily  1 packet Juven BID, each packet provides 95 calories, 2.5 grams of protein (collagen), and 9.8 grams of carbohydrate (3 grams sugar); also contains 7 grams of L-arginine and L-glutamine, 300 mg vitamin C, 15 mg vitamin E, 1.2 mcg vitamin B-12, 9.5 mg zinc, 200 mg calcium, and 1.5 g  Calcium Beta-hydroxy-Beta-methylbutyrate to support wound healing   NUTRITION DIAGNOSIS:   Increased nutrient needs related to acute illness, chronic illness, wound healing (COVID-19 virus infection, chronic dCHF, stage II sacurm, chronic L heel diabetic ulcer) as evidenced by estimated needs.  GOAL:   Patient will meet greater than or equal to 90% of their needs  MONITOR:   PO intake, Diet advancement, Labs, Skin, Weight trends, Supplement acceptance  REASON FOR ASSESSMENT:   Malnutrition Screening Tool    ASSESSMENT:  RD working remotely.  74 year old male with history of PVD s/p stent, iron deficiency anemia, [name], [name], chronic dCHF, aortic stenosis, DM2, [name] with CPAP, s/p R AKA in 2019, chronic left heel ulcer, presented with 1 week history of AMS, diarrhea and cough admitted with aspiration pneumonia and found to be COVID-19 positive s/p being fully vaccinated.  Noted pt reporting he is very tired this afternoon, been awake since 4am d/t family continuously called as pt wife is currently in ICU. RD did not attempt to contact pt today, will obtain nutrition history at follow-up. Patient is eating well, however he has significantly increased needs d/t Covid-19 infection as well as stage II pressure injury to sacrum and chronic left heel diabetic ulcer present on admission. Spoke with MD via secure chat, will liberalize diet to regular. Will order Ensure  supplement BID to aid with meeting needs as well as Juven to support wound healing.   Per chart weights have trended down 24 lbs (15%) from usual wt; significant. Given trends as well as history of chronic dCHF, stg II pressure injury and chronic diabetic ulcer poa, highly suspect degree of malnutrition, however unable to identify at this time. Will plan to perform NFPE at follow-up.   Medications reviewed and include: Vit C, Ferrous sulfate, SSI, Methylprednisolone, Zinc sulfate, Remdesivir IVPB: Unasyn, Zithromax  Labs: CBGs 179,171,127, BUN 29 (H), Cr 1.45 (H) 9/17 A1c 6.0 well controlled  NUTRITION - FOCUSED PHYSICAL EXAM: Unable to complete at this time, RD working remotely.   Diet Order:  100% x 3 Diet Order            Diet Carb Modified Fluid consistency: Thin; Room service appropriate? Yes with Assist  Diet effective now                 EDUCATION NEEDS:   No education needs have been identified at this time  Skin:  Skin Assessment: Skin Integrity Issues: Skin Integrity Issues:: Diabetic Ulcer, Stage II Stage II: sacrum Diabetic Ulcer: L;heel  Last BM:  pta  Height:   Ht Readings from Last 1 Encounters:  08/10/20 5\' 9"  (1.753 m)    Weight:   Wt Readings from Last 1 Encounters:  08/10/20 61.6 kg    Ideal Body Weight:   (Adj IBW for R AKA)  BMI:  Body mass index is 20.05 kg/m.  Estimated Nutritional Needs:   Kcal:  1900-2160  Protein:  95-108  Fluid:  >  1.5 L   Lars Masson, RD, LDN Clinical Nutrition After Hours/Weekend Pager # in Amion

## 2020-08-11 NOTE — Progress Notes (Signed)
°  Progress Note    08/11/2020 10:00 AM  Subjective: Patient requesting to see vascular surgery again, really no specific questions hopeful to avoid any further surgery  Vitals:   08/11/20 0327 08/11/20 0731  BP: 129/60 (!) 142/57  Pulse: (!) 47 (!) 51  Resp: 15 16  Temp: 98 F (36.7 C) 98.1 F (36.7 C)  SpO2: 97% 97%    Physical Exam: Left lower extremity is warm Left heel wound is improving with decreased size of eschar relative to previous pictures No Doppler available in Covid unit for use  CBC    Component Value Date/Time   WBC 16.4 (H) 08/11/2020 0214   RBC 2.84 (L) 08/11/2020 0214   HGB 7.6 (L) 08/11/2020 0214   HGB 8.9 (L) 10/26/2018 1215   HCT 23.8 (L) 08/11/2020 0214   HCT 27.2 (L) 10/26/2018 1215   PLT 206 08/11/2020 0214   PLT 264 10/26/2018 1215   MCV 83.8 08/11/2020 0214   MCV 80 10/26/2018 1215   MCH 26.8 08/11/2020 0214   MCHC 31.9 08/11/2020 0214   RDW 13.8 08/11/2020 0214   RDW 14.6 10/26/2018 1215   LYMPHSABS 1.6 08/11/2020 0214   LYMPHSABS 3.2 (H) 10/26/2018 1215   MONOABS 1.5 (H) 08/11/2020 0214   EOSABS 0.0 08/11/2020 0214   EOSABS 1.8 (H) 10/26/2018 1215   BASOSABS 0.0 08/11/2020 0214   BASOSABS 0.1 10/26/2018 1215    BMET    Component Value Date/Time   NA 135 08/11/2020 0214   NA 138 10/26/2018 1215   K 4.0 08/11/2020 0214   CL 100 08/11/2020 0214   CO2 25 08/11/2020 0214   GLUCOSE 121 (H) 08/11/2020 0214   BUN 29 (H) 08/11/2020 0214   BUN 15 10/26/2018 1215   CREATININE 1.45 (H) 08/11/2020 0214   CALCIUM 8.1 (L) 08/11/2020 0214   GFRNONAA 46 (L) 08/11/2020 0214   GFRAA 54 (L) 08/11/2020 0214    INR    Component Value Date/Time   INR 1.2 08/09/2020 1944    No intake or output data in the 24 hours ending 08/11/20 1000   Assessment:  74 y.o. male is previous left lower extremity endovascular revascularization now with improving left heel wound.  Plan: We will continue to follow his wound as an outpatient.  At this  time he has no signs of infection and the eschar has certainly become smaller.  He needs to float his heel while in bed.  He can see [place] as an outpatient for further evaluation.  Shauni Henner C. [name], MD [facility] Office: [phone] Pager: [phone]  08/11/2020 10:00 AM

## 2020-08-12 LAB — GLUCOSE, CAPILLARY
Glucose-Capillary: 91 mg/dL (ref 70–99)
Glucose-Capillary: 109 mg/dL — ABNORMAL HIGH (ref 70–99)
Glucose-Capillary: 179 mg/dL — ABNORMAL HIGH (ref 70–99)
Glucose-Capillary: 285 mg/dL — ABNORMAL HIGH (ref 70–99)
Glucose-Capillary: 233 mg/dL — ABNORMAL HIGH (ref 70–99)

## 2020-08-12 LAB — CBC WITH DIFFERENTIAL/PLATELET
Abs Immature Granulocytes: 0.09 10*3/uL — ABNORMAL HIGH (ref 0.00–0.07)
Basophils Absolute: 0 10*3/uL (ref 0.0–0.1)
Basophils Relative: 0 %
Eosinophils Absolute: 0 10*3/uL (ref 0.0–0.5)
Eosinophils Relative: 0 %
HCT: 28.3 % — ABNORMAL LOW (ref 39.0–52.0)
Hemoglobin: 9.3 g/dL — ABNORMAL LOW (ref 13.0–17.0)
Immature Granulocytes: 1 %
Lymphocytes Relative: 6 %
Lymphs Abs: 1 10*3/uL (ref 0.7–4.0)
MCH: 27.8 pg (ref 26.0–34.0)
MCHC: 32.9 g/dL (ref 30.0–36.0)
MCV: 84.5 fL (ref 80.0–100.0)
Monocytes Absolute: 0.3 10*3/uL (ref 0.1–1.0)
Monocytes Relative: 2 %
Neutro Abs: 14.2 10*3/uL — ABNORMAL HIGH (ref 1.7–7.7)
Neutrophils Relative %: 91 %
Platelets: 270 10*3/uL (ref 150–400)
RBC: 3.35 MIL/uL — ABNORMAL LOW (ref 4.22–5.81)
RDW: 14.1 % (ref 11.5–15.5)
WBC: 15.7 10*3/uL — ABNORMAL HIGH (ref 4.0–10.5)
nRBC: 0 % (ref 0.0–0.2)

## 2020-08-12 LAB — MAGNESIUM: Magnesium: 1.8 mg/dL (ref 1.7–2.4)

## 2020-08-12 LAB — COMPREHENSIVE METABOLIC PANEL
ALT: 30 U/L (ref 0–44)
AST: 33 U/L (ref 15–41)
Albumin: 2.1 g/dL — ABNORMAL LOW (ref 3.5–5.0)
Alkaline Phosphatase: 71 U/L (ref 38–126)
Anion gap: 10 (ref 5–15)
BUN: 30 mg/dL — ABNORMAL HIGH (ref 8–23)
CO2: 24 mmol/L (ref 22–32)
Calcium: 8.2 mg/dL — ABNORMAL LOW (ref 8.9–10.3)
Chloride: 102 mmol/L (ref 98–111)
Creatinine, Ser: 1.43 mg/dL — ABNORMAL HIGH (ref 0.61–1.24)
GFR calc Af Amer: 55 mL/min — ABNORMAL LOW (ref >60)
GFR calc non Af Amer: 47 mL/min — ABNORMAL LOW (ref >60)
Glucose, Bld: 237 mg/dL — ABNORMAL HIGH (ref 70–99)
Potassium: 3.9 mmol/L (ref 3.5–5.1)
Sodium: 136 mmol/L (ref 135–145)
Total Bilirubin: 0.4 mg/dL (ref 0.3–1.2)
Total Protein: 7.1 g/dL (ref 6.5–8.1)

## 2020-08-12 LAB — BRAIN NATRIURETIC PEPTIDE: B Natriuretic Peptide: 440.7 pg/mL — ABNORMAL HIGH (ref 0.0–100.0)

## 2020-08-12 LAB — D-DIMER, QUANTITATIVE: D-Dimer, Quant: 2.77 ug/mL-FEU — ABNORMAL HIGH (ref 0.00–0.50)

## 2020-08-12 LAB — C-REACTIVE PROTEIN: CRP: 8.9 mg/dL — ABNORMAL HIGH (ref <1.0)

## 2020-08-12 LAB — PROCALCITONIN: Procalcitonin: 0.58 ng/mL

## 2020-08-12 MED ORDER — AMLODIPINE BESYLATE 10 MG PO TABS
10.0000 mg | ORAL_TABLET | Freq: Every day | ORAL | Status: DC
  Administered 2020-08-12 – 2020-08-21 (×10): 10 mg via ORAL
  Filled 2020-08-12 (×10): qty 1

## 2020-08-12 NOTE — Progress Notes (Signed)
PROGRESS NOTE                                                                                                                                                                                                             Patient Demographics:    Jeremiah [name], is a 74 y.o. male, DOB - 06-16-1946, OYD:[id number]  Outpatient Primary MD for the patient is Jeremiah Brod, MD    LOS - 3  Admit date - 08/09/2020    Chief Complaint  Patient presents with  . Code Sepsis       Brief Narrative - 74 y.o. male with medical history significant of PVD  Sp Left SFA stent, HTN, iron deficiency anemia, [name], [name], chronic diastolic CHF, aortic stenosis, [name], [name], [name], [name], apparently has had diarrhea for the last several weeks, upon arrival to the ER he was found to have possible COVID-19 pneumonia versus aspiration pneumonia along with dehydration and metabolic encephalopathy and he was admitted to the hospital.  He is fully vaccinated for COVID-19.   Subjective:   Patient in bed, appears comfortable, denies any headache, no fever, no chest pain or pressure, no shortness of breath , no abdominal pain. No focal weakness.   Assessment  & Plan :     1. Incidental COVID-19 infection in a patient was fully vaccinated.- I do not think this is causing any issues will monitor for now.  He was started on IV steroids and Remdesivir upon admission will continue cautiously although I do not think he has active COVID-19 pneumonia.  Will taper off steroids quickly.  Encouraged the patient to sit up in chair in the daytime use I-S and flutter valve for pulmonary toiletry and then prone in bed when at night.  Will advance activity and titrate down oxygen as possible.   SpO2: 97 % O2 Flow Rate (L/min): 2 L/min  Recent Labs  Lab 08/09/20 1944 08/09/20 1956 08/09/20 2101  08/09/20 2156 08/10/20 0033 08/10/20 0320 08/10/20 1453 08/11/20 0214  WBC 18.2*  --   --   --   --  22.3*  --  16.4*  PLT 231  --   --   --   --  226  --  206  CRP  --   --   --  9.6*  --  13.2*  --  17.9*  BNP  --   --   --   --   --   --  400.1* 380.7*  DDIMER  --   --   --  3.30*  --  3.06*  --  1.84*  PROCALCITON  --   --   --  0.37 0.63 0.87  --  1.34  AST 18  --   --   --   --  19  --  23  ALT 17  --   --   --   --  18  --  17  ALKPHOS 91  --   --   --   --  87  --  67  BILITOT 0.4  --   --   --   --  0.5  --  0.5  ALBUMIN 2.6*  --   --   --   --  2.3*  --  1.9*  INR 1.2  --   --   --   --   --   --   --   LATICACIDVEN 1.3  --  0.9  --   --   --   --   --   SARSCOV2NAA  --  POSITIVE*  --   --   --   --   --   --       2. PAD with R AKA, recent L. SMA stent with Chronic left heel ulcer present on admission.  Wound care on board, continue on dual antiplatelet therapy along with statin for secondary prevention, he follows with Jeremiah [name], wound care has been consulted, seen by vascular surgery for now wound actually now seems to be improving per vascular surgery, continue supportive care.  3.  Aspiration pneumonia.  Bonded well to antibiotics and for now continue Unasyn and discontinue azithromycin.  MRSA PCR negative, speech following, clinically improved on 08/11/2020 will continue to monitor.   4.  Metabolic encephalopathy upon admission.  Likely due to aspiration pneumonia and chronic inflammation from his left heel ulcer, improved with supportive care, CT head nonacute.  5.  Mild hypoxia upon admission.  Currently 100% on 2 L and in no distress.  Will monitor.  Likely due to aspiration pneumonia.  6.  History of aortic stenosis at least moderate per recent echocardiogram.  Outpatient cardiology follow-up.  Aortic valve area is 1.34 cm and V-max is 3.27 m/s.  Continue beta-blocker.  Avoid sudden preload reduction.   7.  Essential hypertension.  On beta-blocker, dose adjusted as  he was slightly bradycardic, added Norvasc as well for better control.  8.  Dehydration due to diarrhea.  Diarrhea seems to have resolved. Improved after IV fluids.  9.  CKD 3A.  Baseline creatinine around 1.4.  Monitor.  10. Chronic iron deficiency anemia.  No need for transfusion outpatient PCP age-appropriate work-up and follow-up.  11.  Elevated D-dimer.  Likely due to infection and inflammation, negative lower extremity ultrasound.   12. DM type II.  On sliding scale.  .lasya CBG (last 3)  Recent Labs    08/11/20 2348 08/12/20 0413 08/12/20 0806  GLUCAP 168* 91 109*      Condition -  Guarded  Family Communication  :    Jeremiah [name] - [phone] - 08/10/20  Jeremiah [name] [phone] on 08/10/2020 - called twice, 9:48 AM full mailbox.  Updated on 08/11/20  Also number listed for wife Jeremiah [name] [phone] has full mailbox on  08/10/2020.   Code Status :  Full  Consults  :  VVS  Procedures  :    CT Head - Non Acute  Leg [place] - No DVT  Recent TTE 05/2020 - 1. Left ventricular ejection fraction, by estimation, is 70 to 75%. The left ventricle has hyperdynamic function. The left ventricle has no regional wall motion abnormalities. There is moderate left ventricular hypertrophy. Left ventricular diastolic parameters are consistent with Grade I diastolic dysfunction (impaired relaxation).  2. Right ventricular systolic function is normal. The right ventricular size is normal.  3. The mitral valve is normal in structure. No evidence of mitral valve regurgitation. No evidence of mitral stenosis.  4. The aortic valve has an indeterminant number of cusps. Aortic valve regurgitation is not visualized. Moderate aortic valve stenosis. Aortic valve area, by VTI measures 1.34 cm. Aortic valve mean gradient measures  23.8 mmHg. Aortic valve Vmax measures 3.27 m/s.  5. The inferior vena cava is normal in size with greater than 50% respiratory variability, suggesting right atrial  pressure of 3 mmHg.    PUD Prophylaxis : PPI  Disposition Plan  :    Status is: Inpatient  Remains inpatient appropriate because:IV treatments appropriate due to intensity of illness or inability to take PO   Dispo: The patient is from: Home              Anticipated d/c is to: Home              Anticipated d/c date is: > 3 days              Patient currently is not medically stable to d/c.   DVT Prophylaxis  :  Lovenox   Lab Results  Component Value Date   PLT 206 08/11/2020    Diet :  Diet Order            Diet regular Room service appropriate? Yes; Fluid consistency: Thin  Diet effective now                  Inpatient Medications  Scheduled Meds: . amLODipine  10 mg Oral Daily  . vitamin C  500 mg Oral Daily  . aspirin  81 mg Oral Daily  . atorvastatin  20 mg Oral Daily  . brimonidine  1 drop Both Eyes BID  . clopidogrel  75 mg Oral Q breakfast  . enoxaparin (LOVENOX) injection  40 mg Subcutaneous Q24H  . feeding supplement (ENSURE ENLIVE)  237 mL Oral BID BM  . ferrous sulfate  325 mg Oral BID WC  . insulin aspart  0-9 Units Subcutaneous Q4H  . methylPREDNISolone (SOLU-MEDROL) injection  20 mg Intravenous Daily  . metoprolol succinate  25 mg Oral Daily  . nutrition supplement (JUVEN)  1 packet Oral BID BM  . pantoprazole  40 mg Oral Daily  . sodium chloride flush  3 mL Intravenous Q12H  . timolol  1 drop Both Eyes BID  . zinc sulfate  220 mg Oral Daily   Continuous Infusions: . ampicillin-sulbactam (UNASYN) IV 3 g (08/12/20 0952)  . remdesivir 100 mg in NS 100 mL Stopped (08/11/20 0903)   PRN Meds:.acetaminophen, guaiFENesin-dextromethorphan, [DISCONTINUED] ondansetron **OR** ondansetron (ZOFRAN) IV, traMADol  Antibiotics  :    Anti-infectives (From admission, onward)   Start     Dose/Rate Route Frequency Ordered Stop   08/11/20 1000  remdesivir 100 mg in sodium chloride 0.9 % 100 mL IVPB       "Followed by"  Linked Group Details   100 mg 200  mL/hr over 30 Minutes Intravenous Daily 08/09/20 2256 08/15/20 0959   08/10/20 2000  azithromycin (ZITHROMAX) 500 mg in sodium chloride 0.9 % 250 mL IVPB  Status:  Discontinued        500 mg 250 mL/hr over 60 Minutes Intravenous Every 24 hours 08/09/20 2256 08/12/20 1042   08/10/20 1800  cefTRIAXone (ROCEPHIN) 2 g in sodium chloride 0.9 % 100 mL IVPB  Status:  Discontinued        2 g 200 mL/hr over 30 Minutes Intravenous Every 24 hours 08/09/20 2256 08/09/20 2332   08/10/20 0015  Ampicillin-Sulbactam (UNASYN) 3 g in sodium chloride 0.9 % 100 mL IVPB        3 g 200 mL/hr over 30 Minutes Intravenous Every 8 hours 08/10/20 0002     08/10/20 0000  remdesivir 200 mg in sodium chloride 0.9% 250 mL IVPB       "Followed by" Linked Group Details   200 mg 580 mL/hr over 30 Minutes Intravenous Once 08/09/20 2256 08/10/20 0250   08/09/20 2030  cefTRIAXone (ROCEPHIN) 1 g in sodium chloride 0.9 % 100 mL IVPB        1 g 200 mL/hr over 30 Minutes Intravenous  Once 08/09/20 2020 08/09/20 2139   08/09/20 2030  azithromycin (ZITHROMAX) 500 mg in sodium chloride 0.9 % 250 mL IVPB        500 mg 250 mL/hr over 60 Minutes Intravenous  Once 08/09/20 2020 08/09/20 2140       Time Spent in minutes  30   Susa Raring M.D on 08/12/2020 at 10:44 AM  To page go to [email or web address] - password [facility]  [facility] -  Office  [phone]    See all Orders from today for further details    Objective:   Vitals:   08/12/20 0055 08/12/20 0253 08/12/20 0403 08/12/20 0804  BP: (!) 139/54 (!) 162/58 (!) 157/59 (!) 182/62  Pulse: (!) 50 (!) 50 (!) 52 (!) 53  Resp: 12 12 17 16   Temp: 98.1 F (36.7 C) 98.3 F (36.8 C) 98.5 F (36.9 C) 98.1 F (36.7 C)  TempSrc: Axillary Axillary Axillary Oral  SpO2: 99% 99% 99% 97%  Weight:      Height:        Wt Readings from Last 3 Encounters:  08/10/20 61.6 kg  06/25/20 72.6 kg  06/19/20 72.6 kg     Intake/Output Summary (Last 24 hours) at 08/12/2020  1044 Last data filed at 08/12/2020 0836 Gross per 24 hour  Intake 1120 ml  Output 3250 ml  Net -2130 ml     Physical Exam  Awake Alert, No new F.N deficits, Normal affect [place].AT,PERRAL Supple Neck,No JVD, No cervical lymphadenopathy appriciated.  Symmetrical Chest wall movement, Good air movement bilaterally, CTAB RRR,No Gallops, Rubs or new Murmurs, No Parasternal Heave +ve B.Sounds, Abd Soft, No tenderness, No organomegaly appriciated, No rebound - guarding or rigidity. R-AKA, L heel old ulcer POA    Data Review:    CBC Recent Labs  Lab 08/09/20 1944 08/10/20 0320 08/10/20 1507 08/11/20 0214  WBC 18.2* 22.3*  --  16.4*  HGB 9.6* 9.1* 8.8* 7.6*  HCT 30.5* 28.2* 26.0* 23.8*  PLT 231 226  --  206  MCV 85.7 85.5  --  83.8  MCH 27.0 27.6  --  26.8  MCHC 31.5 32.3  --  31.9  RDW 13.6 13.7  --  13.8  LYMPHSABS 1.4 0.9  --  1.6  MONOABS 1.6* 0.9  --  1.5*  EOSABS 1.2* 0.0  --  0.0  BASOSABS 0.1 0.1  --  0.0    Recent Labs  Lab 08/09/20 1944 08/09/20 2101 08/09/20 2156 08/10/20 0033 08/10/20 0320 08/10/20 1453 08/10/20 1507 08/11/20 0214  NA 133*  --   --   --  133*  --  136 135  K 4.4  --   --   --  4.1  --  4.0 4.0  CL 97*  --   --   --  96*  --   --  100  CO2 26  --   --   --  25  --   --  25  GLUCOSE 150*  --   --   --  173*  --   --  121*  BUN 13  --   --   --  15  --   --  29*  CREATININE 1.44*  --   --   --  1.46*  --   --  1.45*  CALCIUM 8.9  --   --   --  8.5*  --   --  8.1*  AST 18  --   --   --  19  --   --  23  ALT 17  --   --   --  18  --   --  17  ALKPHOS 91  --   --   --  87  --   --  67  BILITOT 0.4  --   --   --  0.5  --   --  0.5  ALBUMIN 2.6*  --   --   --  2.3*  --   --  1.9*  MG  --   --   --   --  1.7  --   --  1.9  CRP  --   --  9.6*  --  13.2*  --   --  17.9*  DDIMER  --   --  3.30*  --  3.06*  --   --  1.84*  PROCALCITON  --   --  0.37 0.63 0.87  --   --  1.34  LATICACIDVEN 1.3 0.9  --   --   --   --   --   --   INR 1.2  --   --    --   --   --   --   --   HGBA1C  --   --   --  6.0*  --   --   --   --   BNP  --   --   --   --   --  400.1*  --  380.7*    ------------------------------------------------------------------------------------------------------------------ Recent Labs    08/09/20 2156  TRIG 115    Lab Results  Component Value Date   HGBA1C 6.0 (H) 08/10/2020   ------------------------------------------------------------------------------------------------------------------ No results for input(s): TSH, T4TOTAL, T3FREE, THYROIDAB in the last 72 hours.  Invalid input(s): FREET3  Cardiac Enzymes No results for input(s): CKMB, TROPONINI, MYOGLOBIN in the last 168 hours.  Invalid input(s): CK ------------------------------------------------------------------------------------------------------------------    Component Value Date/Time   BNP 380.7 (H) 08/11/2020 0214    Micro Results Recent Results (from the past 240 hour(s))  Blood culture (routine single)     Status: None (Preliminary result)   Collection Time: 08/09/20  7:28 PM   Specimen: BLOOD  Result Value Ref  Range Status   Specimen Description BLOOD RIGHT ANTECUBITAL  Final   Special Requests   Final    BOTTLES DRAWN AEROBIC ONLY Blood Culture results may not be optimal due to an inadequate volume of blood received in culture bottles   Culture   Final    NO GROWTH 2 DAYS Performed at [facility], [address], [place], [place] [address]    Report Status PENDING  Incomplete  SARS Coronavirus 2 by RT PCR (hospital order, performed in [facility] hospital lab) Nasopharyngeal Nasopharyngeal Swab     Status: Abnormal   Collection Time: 08/09/20  7:56 PM   Specimen: Nasopharyngeal Swab  Result Value Ref Range Status   SARS Coronavirus 2 POSITIVE (A) NEGATIVE Final    Comment: RESULT CALLED TO, READ BACK BY AND VERIFIED WITH: B,OSORIO  08/09/20 EB (NOTE) SARS-CoV-2 target nucleic acids are DETECTED  SARS-CoV-2 RNA is  generally detectable in upper respiratory specimens  during the acute phase of infection.  Positive results are indicative  of the presence of the identified virus, but do not rule out bacterial infection or co-infection with other pathogens not detected by the test.  Clinical correlation with patient history and  other diagnostic information is necessary to determine patient infection status.  The expected result is negative.  Fact Sheet for Patients:   BoilerBrush.com.cy   Fact Sheet for Healthcare Providers:   https://pope.com/    This test is not yet approved or cleared by the [place] FDA and  has been authorized for detection and/or diagnosis of SARS-CoV-2 by FDA under an Emergency Use Authorization (EUA).  This EUA will remain in effect (meaning this test can be  used) for the duration of  the COVID-19 declaration under Section 564(b)(1) of the Act, 21 U.S.C. section 360-bbb-3(b)(1), unless the authorization is terminated or revoked sooner.  Performed at [facility], [address], [place], [place] [address]   Urine culture     Status: None   Collection Time: 08/09/20 10:26 PM   Specimen: In/Out Cath Urine  Result Value Ref Range Status   Specimen Description IN/OUT CATH URINE  Final   Special Requests NONE  Final   Culture   Final    NO GROWTH Performed at [facility], [address], [place], [place] [address]    Report Status 08/10/2020 FINAL  Final  Culture, blood (Routine X 2) w Reflex to ID Panel     Status: None (Preliminary result)   Collection Time: 08/10/20  3:18 AM   Specimen: BLOOD LEFT HAND  Result Value Ref Range Status   Specimen Description BLOOD LEFT HAND  Final   Special Requests   Final    BOTTLES DRAWN AEROBIC AND ANAEROBIC Blood Culture adequate volume   Culture   Final    NO GROWTH 1 DAY Performed at [facility], [address] [place], [place] [address]    Report  Status PENDING  Incomplete  Culture, blood (Routine X 2) w Reflex to ID Panel     Status: None (Preliminary result)   Collection Time: 08/10/20  2:53 PM   Specimen: BLOOD  Result Value Ref Range Status   Specimen Description BLOOD SITE NOT SPECIFIED  Final   Special Requests   Final    BOTTLES DRAWN AEROBIC AND ANAEROBIC Blood Culture adequate volume   Culture   Final    NO GROWTH < 24 HOURS Performed at [facility], [address] [place], [place] [address]    Report Status PENDING  Incomplete  MRSA PCR Screening     Status: None   Collection Time: 08/10/20 10:00 PM   Specimen: Nasal Mucosa; Nasopharyngeal  Result Value Ref Range Status   MRSA by PCR NEGATIVE NEGATIVE Final    Comment:        The GeneXpert MRSA Assay (FDA approved for NASAL specimens only), is one component of a comprehensive MRSA colonization surveillance program. It is not intended to diagnose MRSA infection nor to guide or monitor treatment for MRSA infections. Performed at [facility], [address] [place], [place] [address]     Radiology Reports CT HEAD WO CONTRAST  Result Date: 08/09/2020 CLINICAL DATA:  Delirium EXAM: CT HEAD WITHOUT CONTRAST TECHNIQUE: Contiguous axial images were obtained from the base of the skull through the vertex without intravenous contrast. COMPARISON:  06/25/2020 FINDINGS: Brain: Normal anatomic configuration. Parenchymal volume loss is commensurate with the patient's age. Mild-to-moderate periventricular white matter changes are present likely reflecting the sequela of small vessel ischemia. No abnormal intra or extra-axial mass lesion or fluid collection. No abnormal mass effect or midline shift. No evidence of acute intracranial hemorrhage or infarct. Ventricular size is normal. Cerebellum unremarkable. Vascular: Extensive calcifications are seen within the carotid siphons and distal vertebral arteries bilaterally. No asymmetric hyperdense vasculature at the  skull base. Skull: Intact Sinuses/Orbits: The paranasal sinuses are clear. Intraorbital surgical implant is seen along the lateral aspect of the right ocular globe. The orbits are otherwise unremarkable. Other: Mastoid air cells and middle ear cavities are clear. IMPRESSION: 1. No acute intracranial abnormality. 2. Mild-to-moderate periventricular white matter changes likely reflecting the sequela of small vessel ischemia. Electronically Signed   By: Helyn Numbers MD   On: 08/09/2020 23:46   CT ANGIO CHEST PE W OR WO CONTRAST  Result Date: 08/10/2020 CLINICAL DATA:  Altered mental status with cough and diarrhea for 1 week. Pulmonary embolism suspected. EXAM: CT ANGIOGRAPHY CHEST WITH CONTRAST TECHNIQUE: Multidetector CT imaging of the chest was performed using the standard protocol during bolus administration of intravenous contrast. Multiplanar CT image reconstructions and MIPs were obtained to evaluate the vascular anatomy. CONTRAST:  7mL OMNIPAQUE IOHEXOL 350 MG/ML SOLN COMPARISON:  Radiographs 08/09/2020. Noncontrast chest CT 11/05/2018. FINDINGS: Cardiovascular: The pulmonary arteries are well opacified with contrast to the level of the subsegmental branches. There is no evidence of acute pulmonary embolism. There is a possible small linear filling defect within the right lower lobe segmental pulmonary artery (image 51/5) which could relate to previous pulmonary embolism. There is limited luminal opacification of the systemic vessels without evidence of acute findings. There is diffuse atherosclerosis of the aorta, great vessels and coronary arteries. Aortic valvular calcifications are present. The heart size is normal. There is no pericardial effusion. Mediastinum/Nodes: There are no enlarged mediastinal, hilar or axillary lymph nodes. The thyroid gland, trachea and esophagus demonstrate no significant findings. Lungs/Pleura: Chronic dependent right pleural effusion is associated with pleural thickening  and has mildly progressed compared with the prior CT. There is resulting increased compressive atelectasis of the right middle and lower lobes. The lower lobe is completely collapsed, and there is abrupt cut off of the right lower and middle lobe bronchi proximally. Right middle lobe atelectasis may obscure the previously demonstrated right middle lobe nodular density. There is new patchy right upper lobe airspace disease, suspicious for pneumonia. The left lung is clear. There are small calcified pleural plaques bilaterally. Upper abdomen:  The visualized upper abdomen appears stable without acute findings. Musculoskeletal/Chest wall: There is no chest wall mass or suspicious osseous finding. Review of the MIP images confirms the above findings. IMPRESSION: 1. No evidence of acute pulmonary embolism. Possible small linear filling defect within the right lower lobe segmental pulmonary artery could relate to previous pulmonary embolism. 2. Enlarging chronic right pleural effusion with increased compressive atelectasis of the right middle and lower lobes and associated central bronchial occlusion. Underlying neoplasm cannot be excluded. Further evaluation recommended with thoracentesis and possible bronchoscopy. 3. New patchy right upper lobe airspace disease, suspicious for pneumonia. Radiographic follow up recommended. 4. 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 5. Aortic Atherosclerosis (ICD10-I70.0). Electronically Signed   By: Carey BullocksWilliam  Veazey M.D.   On: 08/10/2020 11:50   DG Chest Port 1 View  Result Date: 08/09/2020 CLINICAL DATA:  Questionable sepsis EXAM: PORTABLE CHEST 1 VIEW COMPARISON:  Chest x-ray 06/25/2020 FINDINGS: The heart size and mediastinal contours are unchanged. Aortic arch calcifications. Interval development of hazy airspace opacity within the right lower lobe. No pulmonary edema. Similar-appearing at least small to moderate volume right pleural  effusion. No left pleural effusion. No pneumothorax. No acute osseous abnormality. IMPRESSION: Stable small to moderate right pleural effusion with interval increase in right lower lobe opacity that may represent inflammation/infection versus passive atelectasis. Electronically Signed   By: Tish FredericksonMorgane  Naveau M.D.   On: 08/09/2020 20:14   DG Foot 2 Views Left  Result Date: 08/09/2020 CLINICAL DATA:  Diabetic left ankle ulceration, EXAM: LEFT FOOT - 2 VIEW COMPARISON:  06/25/2020 FINDINGS: Frontal and lateral views of the left foot are obtained. Stable osteopenia. No acute fracture, subluxation, or dislocation. Marked joint space narrowing and osteophyte formation within the midfoot, consistent with osteoarthritis or developing Charcot arthropathy. Large inferior calcaneal spur unchanged. No radiographic evidence of osteomyelitis. There is progressive dorsal soft tissue swelling of the forefoot. IMPRESSION: 1. Progressive soft tissue swelling of the forefoot. 2. Stable degenerative changes.  No acute or destructive process. Electronically Signed   By: Sharlet SalinaMichael  Brown M.D.   On: 08/09/2020 23:16   VAS US ABI WITH/WO TBI  Result Date: 07/20/2020 LOWER EXTREMITY DOPPLER STUDY Indications: Right AKA. High Risk Factors: Hypertension, hyperlipidemia, Diabetes.  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 Limitations: Today's exam was limited due to patient in wheelchair and Had to              lift patient to bed. Comparison Study: 05/11/20 Performing Technologist: Jeb LeveringJill Parker RDMS, RVT  Examination Guidelines: A complete evaluation includes at minimum, Doppler waveform signals and systolic blood pressure reading at the level of bilateral brachial, anterior tibial, and posterior tibial arteries, when vessel segments are accessible. Bilateral testing is considered an  integral part of a complete examination. Photoelectric Plethysmograph (PPG) waveforms and toe systolic pressure readings are included as required and additional duplex testing as needed. Limited examinations for reoccurring indications may be performed as noted.  ABI Findings: +--------+------------------+-----+--------+--------+ Right   Rt Pressure (mmHg)IndexWaveformComment  +--------+------------------+-----+--------+--------+ [id number]                                     +--------+------------------+-----+--------+--------+ +---------+------------------+-----+--------+----------------------------------+  Left     Lt Pressure (mmHg)IndexWaveformComment                            +---------+------------------+-----+--------+----------------------------------+ Brachial                                not obtained due to clothing                                               interference                       +---------+------------------+-----+--------+----------------------------------+ ATA      165               0.77 biphasic                                   +---------+------------------+-----+--------+----------------------------------+ PTA      188               0.87 biphasic                                   +---------+------------------+-----+--------+----------------------------------+ Great Toe103               0.48 Abnormal                                   +---------+------------------+-----+--------+----------------------------------+ +-------+-----------+-----------+------------+------------+ ABI/TBIToday's ABIToday's TBIPrevious ABIPrevious TBI +-------+-----------+-----------+------------+------------+ Left   0.87       0.48       0.68        0.43         +-------+-----------+-----------+------------+------------+   Summary: Left: 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 The left toe-brachial  index is abnormal.  *See table(s) above for measurements and observations.  Electronically signed by Lemar Livings MD on 07/20/2020 at 9:52:26 AM.    Final    VAS [place] LOWER EXTREMITY VENOUS (DVT)  Result Date: 08/11/2020  Lower Venous DVTStudy Indications: Covid-19, Elevated D-Dimer.  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 Comparison Study: No prior study on file Performing Technologist: Sherren Kerns RVS  Examination Guidelines: A complete evaluation includes B-mode imaging, spectral Doppler, color Doppler, and power Doppler as needed of all accessible portions of each vessel. Bilateral testing is considered an integral part of a complete examination. Limited examinations for reoccurring indications may be performed as noted. The reflux portion of the exam is performed with the patient in reverse Trendelenburg.  +---------+---------------+---------+-----------+----------+--------------+ RIGHT    CompressibilityPhasicitySpontaneityPropertiesThrombus Aging +---------+---------------+---------+-----------+----------+--------------+ CFV      Full           Yes      Yes                                 +---------+---------------+---------+-----------+----------+--------------+ SFJ      Full                                                        +---------+---------------+---------+-----------+----------+--------------+  FV Prox  Full                                                        +---------+---------------+---------+-----------+----------+--------------+ FV Mid   Full                                                        +---------+---------------+---------+-----------+----------+--------------+ FV DistalFull                                                        +---------+---------------+---------+-----------+----------+--------------+ PFV      Full                                                         +---------+---------------+---------+-----------+----------+--------------+ POP                                                   AKA            +---------+---------------+---------+-----------+----------+--------------+ PTV                                                   AKA            +---------+---------------+---------+-----------+----------+--------------+ PERO                                                  AKA            +---------+---------------+---------+-----------+----------+--------------+   +---------+---------------+---------+-----------+----------+-------------------+ LEFT     CompressibilityPhasicitySpontaneityPropertiesThrombus Aging      +---------+---------------+---------+-----------+----------+-------------------+ CFV      Full           Yes      Yes                                      +---------+---------------+---------+-----------+----------+-------------------+ SFJ      Full                                                             +---------+---------------+---------+-----------+----------+-------------------+ FV Prox  Full                                                             +---------+---------------+---------+-----------+----------+-------------------+  FV Mid   Full                                                             +---------+---------------+---------+-----------+----------+-------------------+ FV Distal               Yes      Yes                  patent by color and                                                       Doppler             +---------+---------------+---------+-----------+----------+-------------------+ POP      Full           Yes      Yes                                      +---------+---------------+---------+-----------+----------+-------------------+ PTV      Full                                                              +---------+---------------+---------+-----------+----------+-------------------+ PERO     Full                                                             +---------+---------------+---------+-----------+----------+-------------------+     Summary: RIGHT: - There is no evidence of deep vein thrombosis in the lower extremity.  LEFT: - There is no evidence of deep vein thrombosis in the lower extremity.  *See table(s) above for measurements and observations.    Preliminary

## 2020-08-12 NOTE — Progress Notes (Signed)
Pharmacy Antibiotic Note  Jeremiah [name] is a 74 y.o. male admitted on 08/09/2020 with aspiration pneumonia.  Pharmacy has been consulted for Unasyn dosing. Today is day 4 of therapy. Pt is afebrile with WBC of 16.4 (as of 9/18). Notably, patient is also being treated for COVID with remdesivir and steroids.  Plan: Continue Unasyn at current dose of 3g IV q8h Entered stop date for total length of therapy of 7 days per MD Monitor clinical progress, CBC, and renal function closely  Height: 5\' 9"  (175.3 cm) Weight: 61.6 kg (135 lb 12.9 oz) IBW/kg (Calculated) : 70.7  Temp (24hrs), Avg:98.1 F (36.7 C), Min:97.7 F (36.5 C), Max:98.5 F (36.9 C)  Recent Labs  Lab 08/09/20 1944 08/09/20 2101 08/10/20 0320 08/11/20 0214  WBC 18.2*  --  22.3* 16.4*  CREATININE 1.44*  --  1.46* 1.45*  LATICACIDVEN 1.3 0.9  --   --     Estimated Creatinine Clearance: 37.8 mL/min (A) (by C-G formula based on SCr of 1.45 mg/dL (H)).    No Known Allergies  Antimicrobials this admission: 9/17 Unasyn>>(9/23) Remdesivir 9/17>> 9/17 Azithromycin >>9/18 9/16 Rocephin x1  Microbiology results: 9/16 Urine - NGTD 9/16 Bcx- ng <24h 9/17 MRSA neg 9/16 COVID pos  Thank you for allowing pharmacy to be a part of this patient's care.  10/16  PGY1 pharmacy resident 08/12/2020 12:07 PM

## 2020-08-12 NOTE — Progress Notes (Signed)
CSW attempted to contact patient via phone to discuss recommendation for SNF several times throughout the day, it was either busy or no answer on each attempt. CSW to continue to try to reach patient for assessment.  Blenda Nicely, [place] Clinical Social Worker [phone]

## 2020-08-13 LAB — GLUCOSE, CAPILLARY
Glucose-Capillary: 128 mg/dL — ABNORMAL HIGH (ref 70–99)
Glucose-Capillary: 132 mg/dL — ABNORMAL HIGH (ref 70–99)
Glucose-Capillary: 150 mg/dL — ABNORMAL HIGH (ref 70–99)
Glucose-Capillary: 170 mg/dL — ABNORMAL HIGH (ref 70–99)
Glucose-Capillary: 188 mg/dL — ABNORMAL HIGH (ref 70–99)
Glucose-Capillary: 85 mg/dL (ref 70–99)

## 2020-08-13 LAB — CBC WITH DIFFERENTIAL/PLATELET
Abs Immature Granulocytes: 0.07 10*3/uL (ref 0.00–0.07)
Basophils Absolute: 0 10*3/uL (ref 0.0–0.1)
Basophils Relative: 0 %
Eosinophils Absolute: 0 10*3/uL (ref 0.0–0.5)
Eosinophils Relative: 0 %
HCT: 28.8 % — ABNORMAL LOW (ref 39.0–52.0)
Hemoglobin: 9.4 g/dL — ABNORMAL LOW (ref 13.0–17.0)
Immature Granulocytes: 1 %
Lymphocytes Relative: 19 %
Lymphs Abs: 2.1 10*3/uL (ref 0.7–4.0)
MCH: 27.2 pg (ref 26.0–34.0)
MCHC: 32.6 g/dL (ref 30.0–36.0)
MCV: 83.5 fL (ref 80.0–100.0)
Monocytes Absolute: 0.9 10*3/uL (ref 0.1–1.0)
Monocytes Relative: 8 %
Neutro Abs: 7.9 10*3/uL — ABNORMAL HIGH (ref 1.7–7.7)
Neutrophils Relative %: 72 %
Platelets: 258 10*3/uL (ref 150–400)
RBC: 3.45 MIL/uL — ABNORMAL LOW (ref 4.22–5.81)
RDW: 14 % (ref 11.5–15.5)
WBC: 11 10*3/uL — ABNORMAL HIGH (ref 4.0–10.5)
nRBC: 0 % (ref 0.0–0.2)

## 2020-08-13 LAB — COMPREHENSIVE METABOLIC PANEL
ALT: 29 U/L (ref 0–44)
AST: 25 U/L (ref 15–41)
Albumin: 2.2 g/dL — ABNORMAL LOW (ref 3.5–5.0)
Alkaline Phosphatase: 72 U/L (ref 38–126)
Anion gap: 9 (ref 5–15)
BUN: 40 mg/dL — ABNORMAL HIGH (ref 8–23)
CO2: 26 mmol/L (ref 22–32)
Calcium: 8.6 mg/dL — ABNORMAL LOW (ref 8.9–10.3)
Chloride: 104 mmol/L (ref 98–111)
Creatinine, Ser: 1.46 mg/dL — ABNORMAL HIGH (ref 0.61–1.24)
GFR calc Af Amer: 53 mL/min — ABNORMAL LOW (ref >60)
GFR calc non Af Amer: 46 mL/min — ABNORMAL LOW (ref >60)
Glucose, Bld: 112 mg/dL — ABNORMAL HIGH (ref 70–99)
Potassium: 3.7 mmol/L (ref 3.5–5.1)
Sodium: 139 mmol/L (ref 135–145)
Total Bilirubin: 0.5 mg/dL (ref 0.3–1.2)
Total Protein: 7.2 g/dL (ref 6.5–8.1)

## 2020-08-13 LAB — C-REACTIVE PROTEIN: CRP: 5.9 mg/dL — ABNORMAL HIGH (ref <1.0)

## 2020-08-13 LAB — MAGNESIUM: Magnesium: 2 mg/dL (ref 1.7–2.4)

## 2020-08-13 LAB — D-DIMER, QUANTITATIVE: D-Dimer, Quant: 2.14 ug/mL-FEU — ABNORMAL HIGH (ref 0.00–0.50)

## 2020-08-13 LAB — PROCALCITONIN: Procalcitonin: 0.44 ng/mL

## 2020-08-13 LAB — BRAIN NATRIURETIC PEPTIDE: B Natriuretic Peptide: 332.9 pg/mL — ABNORMAL HIGH (ref 0.0–100.0)

## 2020-08-13 NOTE — [place] FL2 (Signed)
[place] MEDICAID FL2 LEVEL OF CARE SCREENING TOOL     IDENTIFICATION  Patient Name: Jeremiah [name] Birthdate: [birth year] Sex: male Admission Date (Current Location): 08/09/2020  [facility] and [place] Number:  Producer, television/film/video and Address:  [facility], [address], [place], [place] [address]      Provider Number: [id number]  Attending Physician Name and Address:  Leroy Sea, MD  Relative Name and Phone Number:  [name]    Current Level of Care: Hospital Recommended Level of Care: Skilled Nursing Facility Prior Approval Number:    Date Approved/Denied:   PASRR Number: [id number]  Discharge Plan: SNF    Current Diagnoses: Patient Active Problem List   Diagnosis Date Noted   Pneumonia due to COVID-19 virus 08/09/2020   Acute metabolic encephalopathy 08/09/2020   Dysphagia 08/09/2020   Weakness    Acute lower UTI 06/25/2020   Abdominal pain    Gangrene (HCC) 06/18/2020   Post-operative pain 04/21/2019   Abnormality of gait 03/24/2019   Hypoglycemia    Hyperkalemia    Hyponatremia    Diabetic peripheral neuropathy (HCC)    Labile blood glucose    Enteritis due to Clostridium difficile    Blood glucose abnormal    Diarrhea    Benign essential HTN    Hypoalbuminemia due to protein-calorie malnutrition (HCC)    Labile blood pressure    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   Postoperative pain    Acute blood loss anemia    Dyslipidemia    Type 2 diabetes mellitus with peripheral neuropathy (HCC)    S/P AKA (above knee amputation) (HCC) 11/12/2018   Unilateral AKA, right (HCC)    Hypertensive crisis    Diabetes mellitus type 2 in nonobese (HCC)    Pleural effusion 11/08/2018   Fever    Open wound of right foot    Sepsis (HCC) 11/03/2018   Altered mental status    Acute cystitis without hematuria    Leukocytosis    Pressure injury of skin 07/08/2018   Clostridium difficile  colitis 07/07/2018   Hypokalemia 07/07/2018   HLD (hyperlipidemia) 07/07/2018   Acute diverticulitis 06/04/2018   Cholecystitis 05/24/2018   Cholecystitis with cholelithiasis 05/21/2018   Acute systolic heart failure (HCC) 03/27/2018   Diabetes mellitus without complication (HCC) 01/30/2018   Hypertension 01/30/2018   Acute cholecystitis 01/30/2018   AKI (acute kidney injury) (HCC) 01/30/2018   Normocytic anemia 01/30/2018   PVD (peripheral vascular disease) (HCC) 09/05/2014    Orientation RESPIRATION BLADDER Height & Weight     Self, Time, Situation, Place  Normal Continent, External catheter Weight: 61.6 kg Height:  5\' 9"  (175.3 cm)  BEHAVIORAL SYMPTOMS/MOOD NEUROLOGICAL BOWEL NUTRITION STATUS      Continent Diet  AMBULATORY STATUS COMMUNICATION OF NEEDS Skin   Total Care Verbally PU Stage and Appropriate Care (Stg II on sacrum with foam dressing; wound on heel)                       Personal Care Assistance Level of Assistance  Bathing, Dressing, Feeding Bathing Assistance: Maximum assistance Feeding assistance: Limited assistance Dressing Assistance: Maximum assistance     Functional Limitations Info  Hearing   Hearing Info: Impaired      SPECIAL CARE FACTORS FREQUENCY  PT (By licensed PT), OT (By licensed OT)     PT Frequency: 5x week OT Frequency: 5x week  Contractures Contractures Info: Not present    Additional Factors Info  Code Status, Insulin Sliding Scale, Isolation Precautions, Allergies Code Status Info: Full Allergies Info: NKA   Insulin Sliding Scale Info: insulin Isolation Precautions Info: Covid +     Current Medications (08/13/2020):  This is the current hospital active medication list Current Facility-Administered Medications  Medication Dose Route Frequency Provider Last Rate Last Admin   acetaminophen (TYLENOL) tablet 650 mg  650 mg Oral Q6H PRN Doutova, Anastassia, MD       amLODipine (NORVASC) tablet  10 mg  10 mg Oral Daily Susa Raring K, MD   10 mg at 08/13/20 0926   Ampicillin-Sulbactam (UNASYN) 3 g in sodium chloride 0.9 % 100 mL IVPB  3 g Intravenous Q8H Leroy Sea, MD 200 mL/hr at 08/13/20 0755 3 g at 08/13/20 0755   ascorbic acid (VITAMIN C) tablet 500 mg  500 mg Oral Daily Doutova, Jonny Ruiz, MD   500 mg at 08/13/20 4098   aspirin chewable tablet 81 mg  81 mg Oral Daily Doutova, Anastassia, MD   81 mg at 08/13/20 0926   atorvastatin (LIPITOR) tablet 20 mg  20 mg Oral Daily Doutova, Anastassia, MD   20 mg at 08/13/20 0926   brimonidine (ALPHAGAN) 0.2 % ophthalmic solution 1 drop  1 drop Both Eyes BID Doutova, Anastassia, MD   1 drop at 08/13/20 1191   clopidogrel (PLAVIX) tablet 75 mg  75 mg Oral Q breakfast Doutova, Anastassia, MD   75 mg at 08/13/20 0754   enoxaparin (LOVENOX) injection 40 mg  40 mg Subcutaneous Q24H Doutova, Anastassia, MD   40 mg at 08/13/20 4782   feeding supplement (ENSURE ENLIVE) (ENSURE ENLIVE) liquid 237 mL  237 mL Oral BID BM Leroy Sea, MD   237 mL at 08/13/20 0929   ferrous sulfate tablet 325 mg  325 mg Oral BID WC Doutova, Anastassia, MD   325 mg at 08/13/20 0754   guaiFENesin-dextromethorphan (ROBITUSSIN DM) 100-10 MG/5ML syrup 10 mL  10 mL Oral Q4H PRN Doutova, Anastassia, MD   10 mL at 08/13/20 1209   insulin aspart (novoLOG) injection 0-9 Units  0-9 Units Subcutaneous Q4H Doutova, Anastassia, MD   1 Units at 08/13/20 0405   metoprolol succinate (TOPROL-XL) 24 hr tablet 25 mg  25 mg Oral Daily Leroy Sea, MD   25 mg at 08/13/20 9562   nutrition supplement (JUVEN) (JUVEN) powder packet 1 packet  1 packet Oral BID BM Leroy Sea, MD   1 packet at 08/13/20 0925   ondansetron (ZOFRAN) injection 4 mg  4 mg Intravenous Q6H PRN Doutova, Anastassia, MD       pantoprazole (PROTONIX) EC tablet 40 mg  40 mg Oral Daily Leroy Sea, MD   40 mg at 08/13/20 1308   remdesivir 100 mg in sodium chloride 0.9 % 100 mL IVPB   100 mg Intravenous Daily Doutova, Anastassia, MD 200 mL/hr at 08/13/20 0922 100 mg at 08/13/20 6578   sodium chloride flush (NS) 0.9 % injection 3 mL  3 mL Intravenous Q12H Doutova, Anastassia, MD   3 mL at 08/13/20 0929   timolol (TIMOPTIC) 0.5 % ophthalmic solution 1 drop  1 drop Both Eyes BID Doutova, Anastassia, MD   1 drop at 08/13/20 0928   traMADol (ULTRAM) tablet 50 mg  50 mg Oral Q8H PRN Doutova, Anastassia, MD       zinc sulfate capsule 220 mg  220 mg Oral Daily Therisa Doyne, MD  220 mg at 08/13/20 7253     Discharge Medications: Please see discharge summary for a list of discharge medications.  Relevant Imaging Results:  Relevant Lab Results:   Additional Information    Nance Pear, RN

## 2020-08-13 NOTE — Progress Notes (Signed)
Patient is transferred to 5N28. Report was given to the nurse who is going to receive the patient.

## 2020-08-13 NOTE — TOC Initial Note (Addendum)
Transition of Care [facility]) - Initial/Assessment Note    Patient Details  Name: Jeremiah [name] MRN: [id number] Date of Birth: 12/30/1945  [facility] CM/SW Contact:    Mearl Latin, LCSW Phone Number: 08/13/2020, 9:14 AM  Clinical Narrative:                 9am-CSW received consult for SNF placement. CSW called patient however patient stated he could not hear CSW and asked when he could return home. CSW again tried to explain reason for the call but patient asked CSW to call his wife or son, [name]. CSW contacted patient's son, [name] (whose mother is intubated on 2H). CSW explained therapy recommendation of SNF and gave the names of the COVID accepting facilities. He stated that he doubted patient would want to go to SNF and that he will likely want to return home at discharge with home health services. He will consult with patient's spouse and other son and call CSW back.   10am-CSW received return call from [place]. He reported that patient really needs to go to SNF due to his wife being an amputee herself and unable to help patient at home. He and other family members are trying to convince patient to agree to go to SNF.   Expected Discharge Plan: Home w Home Health Services Barriers to Discharge: Continued Medical Work up   Patient Goals and CMS Choice Patient states their goals for this hospitalization and ongoing recovery are:: Return home CMS Medicare.gov Compare Post Acute Care list provided to:: Patient Choice offered to / list presented to : Patient, Adult Children  Expected Discharge Plan and Services Expected Discharge Plan: Home w Home Health Services In-house Referral: Clinical Social Work     Living arrangements for the past 2 months: Single Family Home                                      Prior Living Arrangements/Services Living arrangements for the past 2 months: Single Family Home Lives with:: Spouse Patient language and need for interpreter  reviewed:: Yes Do you feel safe going back to the place where you live?: Yes      Need for Family Participation in Patient Care: Yes (Comment) Care giver support system in place?: Yes (comment)   Criminal Activity/Legal Involvement Pertinent to Current Situation/Hospitalization: No - Comment as needed  Activities of Daily Living Home Assistive Devices/Equipment: Wheelchair, Eyeglasses, Bedside commode/3-in-1, Other (Comment) (sliding board) ADL Screening (condition at time of admission) Patient's cognitive ability adequate to safely complete daily activities?: Yes Is the patient deaf or have difficulty hearing?: Yes Does the patient have difficulty seeing, even when wearing glasses/contacts?: Yes Does the patient have difficulty concentrating, remembering, or making decisions?: No Patient able to express need for assistance with ADLs?: Yes Does the patient have difficulty dressing or bathing?: Yes Independently performs ADLs?: No Communication: Independent Dressing (OT): Needs assistance Is this a change from baseline?: Pre-admission baseline Grooming: Needs assistance Is this a change from baseline?: Pre-admission baseline Feeding: Independent Bathing: Needs assistance Is this a change from baseline?: Pre-admission baseline Toileting: Needs assistance Is this a change from baseline?: Pre-admission baseline In/Out Bed: Dependent Is this a change from baseline?: Pre-admission baseline Walks in Home: Dependent Is this a change from baseline?: Pre-admission baseline Does the patient have difficulty walking or climbing stairs?: Yes Weakness of Legs: Both Weakness of Arms/Hands: Both  Permission Sought/Granted Permission sought to share information with : Family Supports Permission granted to share information with : Yes, Verbal Permission Granted  Share Information with NAME: Brian/Phyllis     Permission granted to share info w Relationship: Son/Wife  Permission granted to share  info w Contact Information: [phone]  Emotional Assessment   Attitude/Demeanor/Rapport: Other (comment) (Hard of Hearing) Affect (typically observed): Other (comment) (Hard of Hearing) Orientation: : Oriented to Self, Oriented to Place, Oriented to  Time, Oriented to Situation Alcohol / Substance Use: Not Applicable Psych Involvement: No (comment)  Admission diagnosis:  Acute respiratory failure with hypoxia (HCC) [J96.01] Altered mental status, unspecified altered mental status type [R41.82] 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 Pneumonia due to COVID-19 virus 08/09/2020  . Acute metabolic encephalopathy 08/09/2020  . Dysphagia 08/09/2020  . Weakness   . Acute lower UTI 06/25/2020  . Abdominal pain   . Gangrene (HCC) 06/18/2020  . Post-operative pain 04/21/2019  . Abnormality of gait 03/24/2019  . Hypoglycemia   . Hyperkalemia   . Hyponatremia   . Diabetic peripheral neuropathy (HCC)   . Labile blood glucose   . Enteritis due to Clostridium difficile   . Blood glucose abnormal   . Diarrhea   . Benign essential HTN   . Hypoalbuminemia due to protein-calorie malnutrition (HCC)   . Labile blood pressure   . 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 Postoperative pain   . Acute blood loss anemia   . Dyslipidemia   . Type 2 diabetes mellitus with peripheral neuropathy (HCC)   . S/P AKA (above knee amputation) (HCC) 11/12/2018  . Unilateral AKA, right (HCC)   . Hypertensive crisis   . Diabetes mellitus type 2 in nonobese (HCC)   . Pleural effusion 11/08/2018  . Fever   . Open wound of right foot   . Sepsis (HCC) 11/03/2018  . Altered mental status   . Acute cystitis without hematuria   . Leukocytosis   . Pressure injury of skin 07/08/2018  . Clostridium difficile colitis  07/07/2018  . Hypokalemia 07/07/2018  . HLD (hyperlipidemia) 07/07/2018  . Acute diverticulitis 06/04/2018  . Cholecystitis 05/24/2018  . Cholecystitis with cholelithiasis 05/21/2018  . Acute systolic heart failure (HCC) 03/27/2018  . Diabetes mellitus without complication (HCC) 01/30/2018  . Hypertension 01/30/2018  . Acute cholecystitis 01/30/2018  . AKI (acute kidney injury) (HCC) 01/30/2018  . Normocytic anemia 01/30/2018  . PVD (peripheral vascular disease) (HCC) 09/05/2014   PCP:  Annita Brod, MD Pharmacy:   [facility] - [place], [place] - [address] [place] [address] Phone: [phone] Fax: [phone]     Social Determinants of Health (SDOH) Interventions    Readmission Risk Interventions Readmission Risk Prevention Plan 08/13/2020 07/08/2018 07/08/2018  Transportation Screening Complete - Complete  Medication Review (RN Care Manager) Referral to Pharmacy - Complete  PCP or Specialist appointment within 3-5 days of discharge Complete - Not Complete  HRI or Home Care Consult Complete (No Data) Complete  SW Recovery Care/Counseling Consult Complete Patient refused Not Complete  Palliative Care Screening Not Applicable - Not Complete  Comments - - (No Data)  Medication Reconcilation (Pharmacy) - - Not Complete  Skilled Nursing Facility Complete - Patient refused  Some recent data might be hidden

## 2020-08-13 NOTE — Evaluation (Signed)
Occupational Therapy Evaluation Patient Details Name: Jeremiah [name] MRN: [id number] DOB: 1946/01/05 Today's Date: 08/13/2020    History of Present Illness 74 yo male with L foot pain was found to be Covid positive.  Pt had L foot revascularization done 06/20/20.  Note a heel wound as well on LLE.  PMHx:  R AKA, DM, PVD, HTN, atherosclerosis, C-diff, gout, PNA, sleep apnea,    Clinical Impression   PTA, pt was living at home with his wife (who goes to HD) and performs BADLs and lateral scoot to w/c. Pt presenting with decreased balance, strength, cognition, and activity tolerance. Pt performing bed mobility with Mod A for trunk support. Requiring Max cues and Min A for lateral scoot to drop arm recliner. Pt also presenting with bowel incontience and requiring Max A for peri care. Pt would benefit from further acute OT to facilitate safe dc. Recommend dc to SNF for further OT to optimize safety, independence with ADLs, and return to PLOF.     Follow Up Recommendations  SNF (Pt may decline rehab, will need HHOT and aide for home)    Equipment Recommendations  None recommended by OT    Recommendations for Other Services PT consult     Precautions / Restrictions Precautions Precautions: Fall Precaution Comments: R AKA Required Braces or Orthoses: Other Brace Other Brace: RLE prosthesis      Mobility Bed Mobility Overal bed mobility: Needs Assistance Bed Mobility: Supine to Sit     Supine to sit: Mod assist     General bed mobility comments: Mod A for pulling trunk into upright posture  Transfers Overall transfer level: Needs assistance Equipment used: None Transfers: Lateral/Scoot Transfers          Lateral/Scoot Transfers: Min assist General transfer comment: Min A for faciltiating hips towards recliner with bedpad. Max cues for pt to particiapte in scoot    Balance Overall balance assessment: Needs assistance Sitting-balance support: Feet supported;No upper  extremity supported Sitting balance-Leahy Scale: Fair                                     ADL either performed or assessed with clinical judgement   ADL Overall ADL's : Needs assistance/impaired Eating/Feeding: Set up;Sitting;Bed level   Grooming: Supervision/safety;Set up;Sitting   Upper Body Bathing: Minimal assistance;Sitting   Lower Body Bathing: Maximal assistance;Sitting/lateral leans;Bed level   Upper Body Dressing : Minimal assistance;Sitting   Lower Body Dressing: Maximal assistance;Sitting/lateral leans;Bed level   Toilet Transfer: Minimal assistance;Anterior/posterior Toilet Transfer Details (indicate cue type and reason): Lateral scoot to drop arm recliner. Min A for managing hips with use of bed pad.  Toileting- Clothing Manipulation and Hygiene: Maximal assistance Toileting - Clothing Manipulation Details (indicate cue type and reason): Max A for peri care as pt with bowel incontience     Functional mobility during ADLs: Minimal assistance (lateral scoot) General ADL Comments: Pt presenting with decreased cognition and awareness of deficits. Pt requiring increased cues to perform lateral scoot to drop arm recliner. Min A for faciltiating hips towards recliner.     Vision Baseline Vision/History: Wears glasses Wears Glasses: At all times Patient Visual Report: No change from baseline       Perception     Praxis      Pertinent Vitals/Pain Pain Assessment: Faces Faces Pain Scale: Hurts little more Pain Location: LLE Pain Descriptors / Indicators: Guarding;Grimacing     Hand Dominance  Right   Extremity/Trunk Assessment Upper Extremity Assessment Upper Extremity Assessment: Overall WFL for tasks assessed   Lower Extremity Assessment Lower Extremity Assessment: Generalized weakness   Cervical / Trunk Assessment Cervical / Trunk Assessment: Kyphotic   Communication Communication Communication: No difficulties   Cognition  Arousal/Alertness: Awake/alert Behavior During Therapy: WFL for tasks assessed/performed Overall Cognitive Status: Impaired/Different from baseline Area of Impairment: Awareness;Problem solving;Following commands;Safety/judgement                       Following Commands: Follows multi-step commands with increased time;Follows one step commands with increased time Safety/Judgement: Decreased awareness of safety;Decreased awareness of deficits Awareness: Emergent Problem Solving: Slow processing;Difficulty sequencing;Requires verbal cues General Comments: Pt presenting with decreased problem solving and awareness. Pt reporting he can go home because he is abel to do anything he can before. But when asked to perform lateral scoot to recliner (drop arm) pt reporting he can't perform transfer and needs therpaist to pick him up. Providing more cues and pt laterally scooting to recliner. Pt also with bowel incontience in bed.   General Comments  VSS on RA    Exercises     Shoulder Instructions      Home Living Family/patient expects to be discharged to:: Private residence Living Arrangements: Spouse/significant other Available Help at Discharge: Personal care attendant;Available PRN/intermittently Type of Home: House Home Access: Ramped entrance     Home Layout: One level     Bathroom Shower/Tub: Chief Strategy Officer: Standard     Home Equipment: Wheelchair - manual;Bedside commode;Shower seat;Walker - 2 wheels;Hospital bed   Additional Comments: has sliding board; wife is bil amputee but has family that assist as needed      Prior Functioning/Environment Level of Independence: Needs assistance  Gait / Transfers Assistance Needed: Did sliding board with assist to set up; uses manual w/c ADL's / Homemaking Assistance Needed: Reports had assistance with sponge baths but could do toileting and dressing            OT Problem List: Decreased  strength;Decreased range of motion;Decreased activity tolerance;Impaired balance (sitting and/or standing);Decreased knowledge of use of DME or AE;Decreased knowledge of precautions      OT Treatment/Interventions: Self-care/ADL training;Therapeutic exercise;Energy conservation;DME and/or AE instruction;Therapeutic activities;Patient/family education    OT Goals(Current goals can be found in the care plan section) Acute Rehab OT Goals Patient Stated Goal: to get stronger and have enough help OT Goal Formulation: With patient Time For Goal Achievement: 08/27/20 Potential to Achieve Goals: Good  OT Frequency: Min 2X/week   Barriers to D/C:            Co-evaluation              AM-PAC OT "6 Clicks" Daily Activity     Outcome Measure Help from another person eating meals?: A Little Help from another person taking care of personal grooming?: A Little Help from another person toileting, which includes using toliet, bedpan, or urinal?: A Lot Help from another person bathing (including washing, rinsing, drying)?: A Lot Help from another person to put on and taking off regular upper body clothing?: A Little Help from another person to put on and taking off regular lower body clothing?: A Lot 6 Click Score: 15   End of Session Nurse Communication: Mobility status  Activity Tolerance: Patient limited by fatigue Patient left: in chair;with call bell/phone within reach  OT Visit Diagnosis: Unsteadiness on feet (R26.81);Other abnormalities of gait and mobility (  R26.89);Muscle weakness (generalized) (M62.81)                Time: 4799-8721 OT Time Calculation (min): 20 min Charges:  OT General Charges $OT Visit: 1 Visit OT Evaluation $OT Eval Moderate Complexity: 1 Mod  Jeremiah [name] MSOT, OTR/L Acute Rehab Pager: [phone] Office: [phone]  Jeremiah [name] 08/13/2020, 5:20 PM

## 2020-08-13 NOTE — Progress Notes (Addendum)
PROGRESS NOTE                                                                                                                                                                                                             Patient Demographics:    Jeremiah [name], is a 74 y.o. male, DOB - [birth year], WUJ:[id number]  Outpatient Primary MD for the patient is Annita Brod, MD    LOS - 4  Admit date - 08/09/2020    Chief Complaint  Patient presents with  . Code Sepsis       Brief Narrative - 74 y.o. male with medical history significant of PVD  Sp Left SFA stent, HTN, iron deficiency anemia, [name], [name], chronic diastolic CHF, aortic stenosis, [name], [name], [name], [name], apparently has had diarrhea for the last several weeks, upon arrival to the ER he was found to have possible COVID-19 pneumonia versus aspiration pneumonia along with dehydration and metabolic encephalopathy and he was admitted to the hospital.  He is fully vaccinated for COVID-19.   Subjective:   Patient in bed, appears comfortable, denies any headache, no fever, no chest pain or pressure, no shortness of breath , no abdominal pain. No focal weakness.   Assessment  & Plan :     1. Incidental COVID-19 infection in a patient was fully vaccinated.- I do not think this is causing any issues will monitor for now.  He was started on IV steroids and Remdesivir upon admission will continue cautiously although I do not think he has active COVID-19 pneumonia.  Will taper off steroids quickly.  Encouraged the patient to sit up in chair in the daytime use I-S and flutter valve for pulmonary toiletry and then prone in bed when at night.  Will advance activity and titrate down oxygen as possible.   SpO2: 99 % O2 Flow Rate (L/min): 2 L/min  Recent Labs  Lab 08/09/20 1944 08/09/20 1956 08/09/20 2101  08/09/20 2156 08/09/20 2156 08/10/20 0033 08/10/20 0320 08/10/20 1453 08/11/20 0214 08/12/20 1323 08/13/20 0402  WBC 18.2*  --   --   --   --   --  22.3*  --  16.4* 15.7* 11.0*  PLT 231  --   --   --   --   --  226  --  206 270 258  CRP  --   --   --  9.6*  --   --  13.2*  --  17.9* 8.9* 5.9*  BNP  --   --   --   --   --   --   --  400.1* 380.7* 440.7* 332.9*  DDIMER  --   --   --  3.30*  --   --  3.06*  --  1.84* 2.77* 2.14*  PROCALCITON  --   --   --  0.37   < > 0.63 0.87  --  1.34 0.58 0.44  AST 18  --   --   --   --   --  19  --  23 33 25  ALT 17  --   --   --   --   --  18  --  17 30 29   ALKPHOS 91  --   --   --   --   --  87  --  67 71 72  BILITOT 0.4  --   --   --   --   --  0.5  --  0.5 0.4 0.5  ALBUMIN 2.6*  --   --   --   --   --  2.3*  --  1.9* 2.1* 2.2*  INR 1.2  --   --   --   --   --   --   --   --   --   --   LATICACIDVEN 1.3  --  0.9  --   --   --   --   --   --   --   --   SARSCOV2NAA  --  POSITIVE*  --   --   --   --   --   --   --   --   --    < > = values in this interval not displayed.      2. PAD with R AKA, recent L. SMA stent with Chronic left heel ulcer present on admission.  Wound care on board, continue on dual antiplatelet therapy along with statin for secondary prevention, he follows with Dr. Randie Heinzain, wound care has been consulted, seen by vascular surgery for now wound actually now seems to be improving per vascular surgery, continue supportive care.  3.  Aspiration pneumonia.  Bonded well to antibiotics and for now continue Unasyn and discontinue azithromycin.  MRSA PCR negative, speech following, clinically improved on 08/11/2020 will continue to monitor.   4.  Metabolic encephalopathy upon admission.  Likely due to aspiration pneumonia and chronic inflammation from his left heel ulcer, improved with supportive care, CT head nonacute.  5.  Mild hypoxia upon admission.  Currently 100% on 2 L and in no distress.  Will monitor.  Likely due to aspiration  pneumonia.  6.  History of aortic stenosis at least moderate per recent echocardiogram.  Outpatient cardiology follow-up.  Aortic valve area is 1.34 cm and V-max is 3.27 m/s.  Continue beta-blocker.  Avoid sudden preload reduction.   7.  Essential hypertension.  On beta-blocker, dose adjusted as he was slightly bradycardic, added Norvasc as well for better control.  8.  Dehydration due to diarrhea.  Diarrhea seems to have resolved. Improved after IV fluids.  9.  CKD 3A.  Baseline creatinine around 1.4.  Monitor.  10. Chronic iron deficiency anemia.  No need for transfusion outpatient PCP age-appropriate work-up  and follow-up.  11.  Elevated D-dimer.  Likely due to infection and inflammation, negative lower extremity ultrasound.   12. DM type II.  On sliding scale.  Joselyn Arrow CBG (last 3)  Recent Labs    08/13/20 0002 08/13/20 0354 08/13/20 0735  GLUCAP 128* 132* 85      Condition -  Guarded  Family Communication  :    Shella Spearing - [phone] - 08/10/20  Jolaine Artist [phone] on 08/10/2020 - called twice, 9:48 AM full mailbox.  Updated on 08/11/20, 08/13/2020  Also number listed for wife [name] [phone] has full mailbox on 08/10/2020.   Code Status :  Full  Consults  :  VVS  Procedures  :    CT Head - Non Acute  Leg [place] - No DVT  Recent TTE 05/2020 - 1. Left ventricular ejection fraction, by estimation, is 70 to 75%. The left ventricle has hyperdynamic function. The left ventricle has no regional wall motion abnormalities. There is moderate left ventricular hypertrophy. Left ventricular diastolic parameters are consistent with Grade I diastolic dysfunction (impaired relaxation).  2. Right ventricular systolic function is normal. The right ventricular size is normal.  3. The mitral valve is normal in structure. No evidence of mitral valve regurgitation. No evidence of mitral stenosis.  4. The aortic valve has an indeterminant number of cusps. Aortic valve  regurgitation is not visualized. Moderate aortic valve stenosis. Aortic valve area, by VTI measures 1.34 cm. Aortic valve mean gradient measures  23.8 mmHg. Aortic valve Vmax measures 3.27 m/s.  5. The inferior vena cava is normal in size with greater than 50% respiratory variability, suggesting right atrial pressure of 3 mmHg.    PUD Prophylaxis : PPI  Disposition Plan  :    Status is: Inpatient  Remains inpatient appropriate because:IV treatments appropriate due to intensity of illness or inability to take PO   Dispo: The patient is from: Home              Anticipated d/c is to: Home              Anticipated d/c date is: > 3 days              Patient currently is not medically stable to d/c.   DVT Prophylaxis  :  Lovenox   Lab Results  Component Value Date   PLT 258 08/13/2020    Diet :  Diet Order            Diet regular Room service appropriate? Yes; Fluid consistency: Thin  Diet effective now                  Inpatient Medications  Scheduled Meds: . amLODipine  10 mg Oral Daily  . vitamin C  500 mg Oral Daily  . aspirin  81 mg Oral Daily  . atorvastatin  20 mg Oral Daily  . brimonidine  1 drop Both Eyes BID  . clopidogrel  75 mg Oral Q breakfast  . enoxaparin (LOVENOX) injection  40 mg Subcutaneous Q24H  . feeding supplement (ENSURE ENLIVE)  237 mL Oral BID BM  . ferrous sulfate  325 mg Oral BID WC  . insulin aspart  0-9 Units Subcutaneous Q4H  . metoprolol succinate  25 mg Oral Daily  . nutrition supplement (JUVEN)  1 packet Oral BID BM  . pantoprazole  40 mg Oral Daily  . sodium chloride flush  3 mL Intravenous Q12H  . timolol  1 drop Both Eyes  BID  . zinc sulfate  220 mg Oral Daily   Continuous Infusions: . ampicillin-sulbactam (UNASYN) IV 3 g (08/13/20 0755)  . remdesivir 100 mg in NS 100 mL 100 mg (08/13/20 0922)   PRN Meds:.acetaminophen, guaiFENesin-dextromethorphan, [DISCONTINUED] ondansetron **OR** ondansetron (ZOFRAN) IV,  traMADol  Antibiotics  :    Anti-infectives (From admission, onward)   Start     Dose/Rate Route Frequency Ordered Stop   08/11/20 1000  remdesivir 100 mg in sodium chloride 0.9 % 100 mL IVPB       "Followed by" Linked Group Details   100 mg 200 mL/hr over 30 Minutes Intravenous Daily 08/09/20 2256 08/15/20 0959   08/10/20 2000  azithromycin (ZITHROMAX) 500 mg in sodium chloride 0.9 % 250 mL IVPB  Status:  Discontinued        500 mg 250 mL/hr over 60 Minutes Intravenous Every 24 hours 08/09/20 2256 08/12/20 1042   08/10/20 1800  cefTRIAXone (ROCEPHIN) 2 g in sodium chloride 0.9 % 100 mL IVPB  Status:  Discontinued        2 g 200 mL/hr over 30 Minutes Intravenous Every 24 hours 08/09/20 2256 08/09/20 2332   08/10/20 0015  Ampicillin-Sulbactam (UNASYN) 3 g in sodium chloride 0.9 % 100 mL IVPB        3 g 200 mL/hr over 30 Minutes Intravenous Every 8 hours 08/10/20 0002 08/16/20 2359   08/10/20 0000  remdesivir 200 mg in sodium chloride 0.9% 250 mL IVPB       "Followed by" Linked Group Details   200 mg 580 mL/hr over 30 Minutes Intravenous Once 08/09/20 2256 08/10/20 0250   08/09/20 2030  cefTRIAXone (ROCEPHIN) 1 g in sodium chloride 0.9 % 100 mL IVPB        1 g 200 mL/hr over 30 Minutes Intravenous  Once 08/09/20 2020 08/09/20 2139   08/09/20 2030  azithromycin (ZITHROMAX) 500 mg in sodium chloride 0.9 % 250 mL IVPB        500 mg 250 mL/hr over 60 Minutes Intravenous  Once 08/09/20 2020 08/09/20 2140       Time Spent in minutes  30   Susa Raring M.D on 08/13/2020 at 9:53 AM  To page go to [email or web address] - password [email or web address]  [facility] -  Office  [phone]    See all Orders from today for further details    Objective:   Vitals:   08/12/20 2010 08/13/20 0001 08/13/20 0358 08/13/20 0811  BP: (!) 153/57 (!) 158/58 (!) 162/61 (!) 153/72  Pulse: (!) 55 (!) 54 (!) 57 64  Resp: 15 16 15 12   Temp: 97.9 F (36.6 C) 97.9 F (36.6 C) 98 F (36.7 C) (!) 97.3 F (36.3  C)  TempSrc: Oral Axillary Axillary Axillary  SpO2: 98% 99% 97% 99%  Weight:      Height:        Wt Readings from Last 3 Encounters:  08/10/20 61.6 kg  06/25/20 72.6 kg  06/19/20 72.6 kg     Intake/Output Summary (Last 24 hours) at 08/13/2020 0953 Last data filed at 08/13/2020 9629 Gross per 24 hour  Intake 960 ml  Output 1300 ml  Net -340 ml     Physical Exam  Awake Alert, No new F.N deficits, Normal affect [place].AT,PERRAL Supple Neck,No JVD, No cervical lymphadenopathy appriciated.  Symmetrical Chest wall movement, Good air movement bilaterally, CTAB RRR,No Gallops, Rubs or new Murmurs, No Parasternal Heave +ve B.Sounds, Abd Soft, No tenderness, No organomegaly appriciated, No rebound -  guarding or rigidity. R-AKA, L heel old ulcer POA    Data Review:    CBC Recent Labs  Lab 08/09/20 1944 08/09/20 1944 08/10/20 0320 08/10/20 1507 08/11/20 0214 08/12/20 1323 08/13/20 0402  WBC 18.2*  --  22.3*  --  16.4* 15.7* 11.0*  HGB 9.6*   < > 9.1* 8.8* 7.6* 9.3* 9.4*  HCT 30.5*   < > 28.2* 26.0* 23.8* 28.3* 28.8*  PLT 231  --  226  --  206 270 258  MCV 85.7  --  85.5  --  83.8 84.5 83.5  MCH 27.0  --  27.6  --  26.8 27.8 27.2  MCHC 31.5  --  32.3  --  31.9 32.9 32.6  RDW 13.6  --  13.7  --  13.8 14.1 14.0  LYMPHSABS 1.4  --  0.9  --  1.6 1.0 2.1  MONOABS 1.6*  --  0.9  --  1.5* 0.3 0.9  EOSABS 1.2*  --  0.0  --  0.0 0.0 0.0  BASOSABS 0.1  --  0.1  --  0.0 0.0 0.0   < > = values in this interval not displayed.    Recent Labs  Lab 08/09/20 1944 08/09/20 1944 08/09/20 2101 08/09/20 2156 08/09/20 2156 08/10/20 0033 08/10/20 0320 08/10/20 1453 08/10/20 1507 08/11/20 0214 08/12/20 1323 08/13/20 0402  NA 133*   < >  --   --   --   --  133*  --  136 135 136 139  K 4.4   < >  --   --   --   --  4.1  --  4.0 4.0 3.9 3.7  CL 97*  --   --   --   --   --  96*  --   --  100 102 104  CO2 26  --   --   --   --   --  25  --   --  GLUCOSE 150*  --   --   --    --   --  173*  --   --  121* 237* 112*  BUN 13  --   --   --   --   --  15  --   --  29* 30* 40*  CREATININE 1.44*  --   --   --   --   --  1.46*  --   --  1.45* 1.43* 1.46*  CALCIUM 8.9  --   --   --   --   --  8.5*  --   --  8.1* 8.2* 8.6*  AST 18  --   --   --   --   --  19  --   --  23 33 25  ALT 17  --   --   --   --   --  18  --   --  ALKPHOS 91  --   --   --   --   --  87  --   --  67 71 72  BILITOT 0.4  --   --   --   --   --  0.5  --   --  0.5 0.4 0.5  ALBUMIN 2.6*  --   --   --   --   --  2.3*  --   --  1.9* 2.1* 2.2*  MG  --   --   --   --   --   --  1.7  --   --  1.9 1.8 2.0  CRP  --   --   --  9.6*  --   --  13.2*  --   --  17.9* 8.9* 5.9*  DDIMER  --   --   --  3.30*  --   --  3.06*  --   --  1.84* 2.77* 2.14*  PROCALCITON  --   --   --  0.37   < > 0.63 0.87  --   --  1.34 0.58 0.44  LATICACIDVEN 1.3  --  0.9  --   --   --   --   --   --   --   --   --   INR 1.2  --   --   --   --   --   --   --   --   --   --   --   HGBA1C  --   --   --   --   --  6.0*  --   --   --   --   --   --   BNP  --   --   --   --   --   --   --  400.1*  --  380.7* 440.7* 332.9*   < > = values in this interval not displayed.    ------------------------------------------------------------------------------------------------------------------ No results for input(s): CHOL, HDL, LDLCALC, TRIG, CHOLHDL, LDLDIRECT in the last 72 hours.  Lab Results  Component Value Date   HGBA1C 6.0 (H) 08/10/2020   ------------------------------------------------------------------------------------------------------------------ No results for input(s): TSH, T4TOTAL, T3FREE, THYROIDAB in the last 72 hours.  Invalid input(s): FREET3  Cardiac Enzymes No results for input(s): CKMB, TROPONINI, MYOGLOBIN in the last 168 hours.  Invalid input(s): CK ------------------------------------------------------------------------------------------------------------------    Component Value Date/Time   BNP 332.9 (H)  08/13/2020 0402    Micro Results Recent Results (from the past 240 hour(s))  Blood culture (routine single)     Status: None (Preliminary result)   Collection Time: 08/09/20  7:28 PM   Specimen: BLOOD  Result Value Ref Range Status   Specimen Description BLOOD RIGHT ANTECUBITAL  Final   Special Requests   Final    BOTTLES DRAWN AEROBIC ONLY Blood Culture results may not be optimal due to an inadequate volume of blood received in culture bottles   Culture   Final    NO GROWTH 3 DAYS Performed at [facility], [address] [place], [place] [address]    Report Status PENDING  Incomplete  SARS Coronavirus 2 by RT PCR (hospital order, performed in [facility] hospital lab) Nasopharyngeal Nasopharyngeal Swab     Status: Abnormal   Collection Time: 08/09/20  7:56 PM   Specimen: Nasopharyngeal Swab  Result Value Ref Range Status   SARS Coronavirus 2 POSITIVE (A) NEGATIVE Final    Comment: RESULT CALLED TO, READ BACK BY AND VERIFIED WITH: B,OSORIO @2154  08/09/20 EB (NOTE) SARS-CoV-2 target nucleic acids are DETECTED  SARS-CoV-2 RNA is generally detectable in upper respiratory specimens  during the acute phase of infection.  Positive results are indicative  of the presence of the identified virus, but do not rule out bacterial infection or co-infection with other pathogens not detected by the test.  Clinical correlation with patient history and  other diagnostic information is necessary to determine patient infection status.  The expected result is negative.  Fact Sheet for Patients:   BoilerBrush.com.cy  Fact Sheet for Healthcare Providers:   https://pope.com/    This test is not yet approved or cleared by the [place] FDA and  has been authorized for detection and/or diagnosis of SARS-CoV-2 by FDA under an Emergency Use Authorization (EUA).  This EUA will remain in effect (meaning this test can be  used) for the duration  of  the COVID-19 declaration under Section 564(b)(1) of the Act, 21 U.S.C. section 360-bbb-3(b)(1), unless the authorization is terminated or revoked sooner.  Performed at [facility], [address], [place], [place] [address]   Urine culture     Status: None   Collection Time: 08/09/20 10:26 PM   Specimen: In/Out Cath Urine  Result Value Ref Range Status   Specimen Description IN/OUT CATH URINE  Final   Special Requests NONE  Final   Culture   Final    NO GROWTH Performed at [facility], [address] [place], [place] [address]    Report Status 08/10/2020 FINAL  Final  Culture, blood (Routine X 2) w Reflex to ID Panel     Status: None (Preliminary result)   Collection Time: 08/10/20  3:18 AM   Specimen: BLOOD LEFT HAND  Result Value Ref Range Status   Specimen Description BLOOD LEFT HAND  Final   Special Requests   Final    BOTTLES DRAWN AEROBIC AND ANAEROBIC Blood Culture adequate volume   Culture   Final    NO GROWTH 2 DAYS Performed at [facility], [address] [place], [place] [address]    Report Status PENDING  Incomplete  Culture, blood (Routine X 2) w Reflex to ID Panel     Status: None (Preliminary result)   Collection Time: 08/10/20  2:53 PM   Specimen: BLOOD  Result Value Ref Range Status   Specimen Description BLOOD SITE NOT SPECIFIED  Final   Special Requests   Final    BOTTLES DRAWN AEROBIC AND ANAEROBIC Blood Culture adequate volume   Culture   Final    NO GROWTH 2 DAYS Performed at [facility], [address] [place], [place] [address]    Report Status PENDING  Incomplete  MRSA PCR Screening     Status: None   Collection Time: 08/10/20 10:00 PM   Specimen: Nasal Mucosa; Nasopharyngeal  Result Value Ref Range Status   MRSA by PCR NEGATIVE NEGATIVE Final    Comment:        The GeneXpert MRSA Assay (FDA approved for NASAL specimens only), is one component of a comprehensive MRSA colonization surveillance program. It  is not intended to diagnose MRSA infection nor to guide or monitor treatment for MRSA infections. Performed at [facility], [address], [place], [place] [address]     Radiology Reports CT HEAD WO CONTRAST  Result Date: 08/09/2020 CLINICAL DATA:  Delirium EXAM: CT HEAD WITHOUT CONTRAST TECHNIQUE: Contiguous axial images were obtained from the base of the skull through the vertex without intravenous contrast. COMPARISON:  06/25/2020 FINDINGS: Brain: Normal anatomic configuration. Parenchymal volume loss is commensurate with the patient's age. Mild-to-moderate periventricular white matter changes are present likely reflecting the sequela of small vessel ischemia. No abnormal intra or extra-axial mass lesion or fluid collection. No abnormal mass effect or midline shift. No evidence of acute intracranial hemorrhage or infarct. Ventricular size is normal. Cerebellum unremarkable. Vascular: Extensive calcifications are seen within the carotid siphons and distal vertebral arteries bilaterally. No asymmetric hyperdense vasculature at the  skull base. Skull: Intact Sinuses/Orbits: The paranasal sinuses are clear. Intraorbital surgical implant is seen along the lateral aspect of the right ocular globe. The orbits are otherwise unremarkable. Other: Mastoid air cells and middle ear cavities are clear. IMPRESSION: 1. No acute intracranial abnormality. 2. Mild-to-moderate periventricular white matter changes likely reflecting the sequela of small vessel ischemia. Electronically Signed   By: Helyn Numbers MD   On: 08/09/2020 23:46   CT ANGIO CHEST PE W OR WO CONTRAST  Result Date: 08/10/2020 CLINICAL DATA:  Altered mental status with cough and diarrhea for 1 week. Pulmonary embolism suspected. EXAM: CT ANGIOGRAPHY CHEST WITH CONTRAST TECHNIQUE: Multidetector CT imaging of the chest was performed using the standard protocol during bolus administration of intravenous contrast. Multiplanar CT image  reconstructions and MIPs were obtained to evaluate the vascular anatomy. CONTRAST:  55mL OMNIPAQUE IOHEXOL 350 MG/ML SOLN COMPARISON:  Radiographs 08/09/2020. Noncontrast chest CT 11/05/2018. FINDINGS: Cardiovascular: The pulmonary arteries are well opacified with contrast to the level of the subsegmental branches. There is no evidence of acute pulmonary embolism. There is a possible small linear filling defect within the right lower lobe segmental pulmonary artery (image 51/5) which could relate to previous pulmonary embolism. There is limited luminal opacification of the systemic vessels without evidence of acute findings. There is diffuse atherosclerosis of the aorta, great vessels and coronary arteries. Aortic valvular calcifications are present. The heart size is normal. There is no pericardial effusion. Mediastinum/Nodes: There are no enlarged mediastinal, hilar or axillary lymph nodes. The thyroid gland, trachea and esophagus demonstrate no significant findings. Lungs/Pleura: Chronic dependent right pleural effusion is associated with pleural thickening and has mildly progressed compared with the prior CT. There is resulting increased compressive atelectasis of the right middle and lower lobes. The lower lobe is completely collapsed, and there is abrupt cut off of the right lower and middle lobe bronchi proximally. Right middle lobe atelectasis may obscure the previously demonstrated right middle lobe nodular density. There is new patchy right upper lobe airspace disease, suspicious for pneumonia. The left lung is clear. There are small calcified pleural plaques bilaterally. Upper abdomen: The visualized upper abdomen appears stable without acute findings. Musculoskeletal/Chest wall: There is no chest wall mass or suspicious osseous finding. Review of the MIP images confirms the above findings. IMPRESSION: 1. No evidence of acute pulmonary embolism. Possible small linear filling defect within the right lower  lobe segmental pulmonary artery could relate to previous pulmonary embolism. 2. Enlarging chronic right pleural effusion with increased compressive atelectasis of the right middle and lower lobes and associated central bronchial occlusion. Underlying neoplasm cannot be excluded. Further evaluation recommended with thoracentesis and possible bronchoscopy. 3. New patchy right upper lobe airspace disease, suspicious for pneumonia. Radiographic follow up recommended. 4. 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 5. Aortic Atherosclerosis (ICD10-I70.0). Electronically Signed   By: Carey Bullocks M.D.   On: 08/10/2020 11:50   DG Chest Port 1 View  Result Date: 08/09/2020 CLINICAL DATA:  Questionable sepsis EXAM: PORTABLE CHEST 1 VIEW COMPARISON:  Chest x-ray 06/25/2020 FINDINGS: The heart size and mediastinal contours are unchanged. Aortic arch calcifications. Interval development of hazy airspace opacity within the right lower lobe. No pulmonary edema. Similar-appearing at least small to moderate volume right pleural effusion. No left pleural effusion. No pneumothorax. No acute osseous abnormality. IMPRESSION: Stable small to moderate right pleural effusion with interval increase in right lower lobe opacity that may represent inflammation/infection versus passive atelectasis. Electronically Signed   By:  Tish Frederickson M.D.   On: 08/09/2020 20:14   DG Foot 2 Views Left  Result Date: 08/09/2020 CLINICAL DATA:  Diabetic left ankle ulceration, EXAM: LEFT FOOT - 2 VIEW COMPARISON:  06/25/2020 FINDINGS: Frontal and lateral views of the left foot are obtained. Stable osteopenia. No acute fracture, subluxation, or dislocation. Marked joint space narrowing and osteophyte formation within the midfoot, consistent with osteoarthritis or developing Charcot arthropathy. Large inferior calcaneal spur unchanged. No radiographic evidence of osteomyelitis. There is progressive dorsal soft  tissue swelling of the forefoot. IMPRESSION: 1. Progressive soft tissue swelling of the forefoot. 2. Stable degenerative changes.  No acute or destructive process. Electronically Signed   By: Sharlet Salina M.D.   On: 08/09/2020 23:16   VAS [place] ABI WITH/WO TBI  Result Date: 07/20/2020 LOWER EXTREMITY DOPPLER STUDY Indications: Right AKA. High Risk Factors: Hypertension, hyperlipidemia, Diabetes.  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 Limitations: Today's exam was limited due to patient in wheelchair and Had to              lift patient to bed. Comparison Study: 05/11/20 Performing Technologist: Jeb Levering RDMS, RVT  Examination Guidelines: A complete evaluation includes at minimum, Doppler waveform signals and systolic blood pressure reading at the level of bilateral brachial, anterior tibial, and posterior tibial arteries, when vessel segments are accessible. Bilateral testing is considered an integral part of a complete examination. Photoelectric Plethysmograph (PPG) waveforms and toe systolic pressure readings are included as required and additional duplex testing as needed. Limited examinations for reoccurring indications may be performed as noted.  ABI Findings: +--------+------------------+-----+--------+--------+ Right   Rt Pressure (mmHg)IndexWaveformComment  +--------+------------------+-----+--------+--------+ [id number]                                     +--------+------------------+-----+--------+--------+ +---------+------------------+-----+--------+----------------------------------+ Left     Lt Pressure (mmHg)IndexWaveformComment                            +---------+------------------+-----+--------+----------------------------------+ Brachial                                not obtained due to clothing                                                interference                       +---------+------------------+-----+--------+----------------------------------+ ATA      165               0.77 biphasic                                   +---------+------------------+-----+--------+----------------------------------+ PTA      188  0.87 biphasic                                   +---------+------------------+-----+--------+----------------------------------+ Great Toe103               0.48 Abnormal                                   +---------+------------------+-----+--------+----------------------------------+ +-------+-----------+-----------+------------+------------+ ABI/TBIToday's ABIToday's TBIPrevious ABIPrevious TBI +-------+-----------+-----------+------------+------------+ Left   0.87       0.48       0.68        0.43         +-------+-----------+-----------+------------+------------+   Summary: Left: 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 The left toe-brachial index is abnormal.  *See table(s) above for measurements and observations.  Electronically signed by Lemar Livings MD on 07/20/2020 at 9:52:26 AM.    Final    VAS [place] LOWER EXTREMITY VENOUS (DVT)  Result Date: 08/12/2020  Lower Venous DVTStudy Indications: Covid-19, Elevated D-Dimer.  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 Comparison Study: No prior study on file Performing Technologist: Sherren Kerns RVS  Examination Guidelines: A complete evaluation includes B-mode imaging, spectral Doppler, color Doppler, and power Doppler as needed of all accessible portions of each vessel. Bilateral testing is considered an integral part of a complete examination. Limited examinations for reoccurring indications may be performed as noted. The reflux portion of the exam is performed with the patient in reverse Trendelenburg.   +---------+---------------+---------+-----------+----------+--------------+ RIGHT    CompressibilityPhasicitySpontaneityPropertiesThrombus Aging +---------+---------------+---------+-----------+----------+--------------+ CFV      Full           Yes      Yes                                 +---------+---------------+---------+-----------+----------+--------------+ SFJ      Full                                                        +---------+---------------+---------+-----------+----------+--------------+ FV Prox  Full                                                        +---------+---------------+---------+-----------+----------+--------------+ FV Mid   Full                                                        +---------+---------------+---------+-----------+----------+--------------+ FV DistalFull                                                        +---------+---------------+---------+-----------+----------+--------------+ PFV      Full                                                        +---------+---------------+---------+-----------+----------+--------------+  POP                                                   AKA            +---------+---------------+---------+-----------+----------+--------------+ PTV                                                   AKA            +---------+---------------+---------+-----------+----------+--------------+ PERO                                                  AKA            +---------+---------------+---------+-----------+----------+--------------+   +---------+---------------+---------+-----------+----------+-------------------+ LEFT     CompressibilityPhasicitySpontaneityPropertiesThrombus Aging      +---------+---------------+---------+-----------+----------+-------------------+ CFV      Full           Yes      Yes                                       +---------+---------------+---------+-----------+----------+-------------------+ SFJ      Full                                                             +---------+---------------+---------+-----------+----------+-------------------+ FV Prox  Full                                                             +---------+---------------+---------+-----------+----------+-------------------+ FV Mid   Full                                                             +---------+---------------+---------+-----------+----------+-------------------+ FV Distal               Yes      Yes                  patent by color and                                                       Doppler             +---------+---------------+---------+-----------+----------+-------------------+ POP      Full           Yes  Yes                                      +---------+---------------+---------+-----------+----------+-------------------+ PTV      Full                                                             +---------+---------------+---------+-----------+----------+-------------------+ PERO     Full                                                             +---------+---------------+---------+-----------+----------+-------------------+     Summary: RIGHT: - There is no evidence of deep vein thrombosis in the lower extremity.  LEFT: - There is no evidence of deep vein thrombosis in the lower extremity.  *See table(s) above for measurements and observations. Electronically signed by Lemar Livings MD on 08/12/2020 at 11:03:46 AM.    Final

## 2020-08-14 LAB — COMPREHENSIVE METABOLIC PANEL
ALT: 35 U/L (ref 0–44)
AST: 22 U/L (ref 15–41)
Albumin: 2.6 g/dL — ABNORMAL LOW (ref 3.5–5.0)
Alkaline Phosphatase: 77 U/L (ref 38–126)
Anion gap: 11 (ref 5–15)
BUN: 35 mg/dL — ABNORMAL HIGH (ref 8–23)
CO2: 24 mmol/L (ref 22–32)
Calcium: 8.9 mg/dL (ref 8.9–10.3)
Chloride: 103 mmol/L (ref 98–111)
Creatinine, Ser: 1.37 mg/dL — ABNORMAL HIGH (ref 0.61–1.24)
GFR calc Af Amer: 58 mL/min — ABNORMAL LOW (ref >60)
GFR calc non Af Amer: 50 mL/min — ABNORMAL LOW (ref >60)
Glucose, Bld: 155 mg/dL — ABNORMAL HIGH (ref 70–99)
Potassium: 3.3 mmol/L — ABNORMAL LOW (ref 3.5–5.1)
Sodium: 138 mmol/L (ref 135–145)
Total Bilirubin: 0.5 mg/dL (ref 0.3–1.2)
Total Protein: 7.7 g/dL (ref 6.5–8.1)

## 2020-08-14 LAB — CBC WITH DIFFERENTIAL/PLATELET
Abs Immature Granulocytes: 0.04 10*3/uL (ref 0.00–0.07)
Basophils Absolute: 0 10*3/uL (ref 0.0–0.1)
Basophils Relative: 0 %
Eosinophils Absolute: 0 10*3/uL (ref 0.0–0.5)
Eosinophils Relative: 0 %
HCT: 32.1 % — ABNORMAL LOW (ref 39.0–52.0)
Hemoglobin: 10.6 g/dL — ABNORMAL LOW (ref 13.0–17.0)
Immature Granulocytes: 0 %
Lymphocytes Relative: 16 %
Lymphs Abs: 2 10*3/uL (ref 0.7–4.0)
MCH: 27.5 pg (ref 26.0–34.0)
MCHC: 33 g/dL (ref 30.0–36.0)
MCV: 83.2 fL (ref 80.0–100.0)
Monocytes Absolute: 1.1 10*3/uL — ABNORMAL HIGH (ref 0.1–1.0)
Monocytes Relative: 8 %
Neutro Abs: 9.6 10*3/uL — ABNORMAL HIGH (ref 1.7–7.7)
Neutrophils Relative %: 76 %
Platelets: 293 10*3/uL (ref 150–400)
RBC: 3.86 MIL/uL — ABNORMAL LOW (ref 4.22–5.81)
RDW: 13.9 % (ref 11.5–15.5)
WBC: 12.7 10*3/uL — ABNORMAL HIGH (ref 4.0–10.5)
nRBC: 0 % (ref 0.0–0.2)

## 2020-08-14 LAB — GLUCOSE, CAPILLARY
Glucose-Capillary: 109 mg/dL — ABNORMAL HIGH (ref 70–99)
Glucose-Capillary: 115 mg/dL — ABNORMAL HIGH (ref 70–99)
Glucose-Capillary: 119 mg/dL — ABNORMAL HIGH (ref 70–99)
Glucose-Capillary: 122 mg/dL — ABNORMAL HIGH (ref 70–99)
Glucose-Capillary: 166 mg/dL — ABNORMAL HIGH (ref 70–99)
Glucose-Capillary: 202 mg/dL — ABNORMAL HIGH (ref 70–99)
Glucose-Capillary: 73 mg/dL (ref 70–99)

## 2020-08-14 LAB — BRAIN NATRIURETIC PEPTIDE: B Natriuretic Peptide: 284.3 pg/mL — ABNORMAL HIGH (ref 0.0–100.0)

## 2020-08-14 LAB — CULTURE, BLOOD (SINGLE): Culture: NO GROWTH

## 2020-08-14 LAB — LEGIONELLA PNEUMOPHILA SEROGP 1 UR AG: L. pneumophila Serogp 1 Ur Ag: NEGATIVE

## 2020-08-14 LAB — MAGNESIUM: Magnesium: 1.9 mg/dL (ref 1.7–2.4)

## 2020-08-14 LAB — C-REACTIVE PROTEIN: CRP: 3.2 mg/dL — ABNORMAL HIGH (ref <1.0)

## 2020-08-14 LAB — PROCALCITONIN: Procalcitonin: 0.15 ng/mL

## 2020-08-14 LAB — D-DIMER, QUANTITATIVE: D-Dimer, Quant: 3.09 ug/mL-FEU — ABNORMAL HIGH (ref 0.00–0.50)

## 2020-08-14 MED ORDER — ENOXAPARIN SODIUM 40 MG/0.4ML ~~LOC~~ SOLN
40.0000 mg | Freq: Two times a day (BID) | SUBCUTANEOUS | Status: DC
  Administered 2020-08-14 – 2020-08-15 (×2): 40 mg via SUBCUTANEOUS
  Filled 2020-08-14 (×2): qty 0.4

## 2020-08-14 MED ORDER — POTASSIUM CHLORIDE CRYS ER 20 MEQ PO TBCR
40.0000 meq | EXTENDED_RELEASE_TABLET | Freq: Once | ORAL | Status: AC
  Administered 2020-08-14: 40 meq via ORAL
  Filled 2020-08-14: qty 2

## 2020-08-14 MED ORDER — HYDRALAZINE HCL 50 MG PO TABS
50.0000 mg | ORAL_TABLET | Freq: Three times a day (TID) | ORAL | Status: DC
  Administered 2020-08-14 – 2020-08-21 (×21): 50 mg via ORAL
  Filled 2020-08-14 (×21): qty 1

## 2020-08-14 NOTE — Progress Notes (Signed)
PROGRESS NOTE                                                                                                                                                                                                             Patient Demographics:    Jeremiah [name], is a 74 y.o. male, DOB - 1946-03-14, ZOX:[id number]  Outpatient Primary MD for the patient is Annita Brod, MD    LOS - 5  Admit date - 08/09/2020    Chief Complaint  Patient presents with  . Code Sepsis       Brief Narrative - 74 y.o. male with medical history significant of PVD  Sp Left SFA stent, HTN, iron deficiency anemia, [name], [name], chronic diastolic CHF, aortic stenosis, [name], [name], [name], [name], apparently has had diarrhea for the last several weeks, upon arrival to the ER he was found to have possible COVID-19 pneumonia versus aspiration pneumonia along with dehydration and metabolic encephalopathy and he was admitted to the hospital.  He is fully vaccinated for COVID-19.   Subjective:   Patient in bed, appears comfortable, denies any headache, no fever, no chest pain or pressure, no shortness of breath , no abdominal pain. No focal weakness.   Assessment  & Plan :     1. Incidental COVID-19 infection in a patient was fully vaccinated.- I do not think this is causing any issues will monitor for now.  He was started on IV steroids and Remdesivir upon admission will continue cautiously although I do not think he has active COVID-19 pneumonia.  Will taper off steroids quickly.  Encouraged the patient to sit up in chair in the daytime use I-S and flutter valve for pulmonary toiletry and then prone in bed when at night.  Will advance activity and titrate down oxygen as possible.   SpO2: 98 % O2 Flow Rate (L/min): 2 L/min  Recent Labs  Lab 08/09/20 1944 08/09/20 1956 08/09/20 2101  08/09/20 2156 08/10/20 0320 08/10/20 1453 08/11/20 0214 08/12/20 1323 08/13/20 0402 08/14/20 0859  WBC 18.2*  --   --    < > 22.3*  --  16.4* 15.7* 11.0* 12.7*  PLT 231  --   --    < > 226  --  206 270 258 293  CRP  --   --   --    < > 13.2*  --  17.9* 8.9* 5.9* 3.2*  BNP  --   --   --   --   --  400.1* 380.7* 440.7* 332.9* 284.3*  DDIMER  --   --   --    < > 3.06*  --  1.84* 2.77* 2.14* 3.09*  PROCALCITON  --   --   --    < > 0.87  --  1.34 0.58 0.44 0.15  AST 18  --   --    < > 19  --  23 33 25 22  ALT 17  --   --    < > 18  --  17 30 29  35  ALKPHOS 91  --   --    < > 87  --  67 71 72 77  BILITOT 0.4  --   --    < > 0.5  --  0.5 0.4 0.5 0.5  ALBUMIN 2.6*  --   --    < > 2.3*  --  1.9* 2.1* 2.2* 2.6*  INR 1.2  --   --   --   --   --   --   --   --   --   LATICACIDVEN 1.3  --  0.9  --   --   --   --   --   --   --   SARSCOV2NAA  --  POSITIVE*  --   --   --   --   --   --   --   --    < > = values in this interval not displayed.      2. PAD with R AKA, recent L. SMA stent with Chronic left heel arterial insufficiency ulcer present on admission.  Wound care on board, continue on dual antiplatelet therapy along with statin for secondary prevention, he follows with Dr. , wound care has been consulted, seen by vascular surgery for now wound actually now seems to be improving per vascular surgery, continue supportive care.  Case discussed with Dr. [name], follow-up with him post discharge in 1 to 2 weeks.  3.  Aspiration pneumonia.  Bonded well to antibiotics and for now continue Unasyn and discontinue azithromycin.  MRSA PCR negative, speech following, clinically improved on 08/11/2020 will continue to monitor.   4.  Metabolic encephalopathy upon admission.  Likely due to aspiration pneumonia and chronic inflammation from his left heel ulcer, improved with supportive care, CT head nonacute.  5.  Mild hypoxia upon admission.  Currently 100% on 2 L and in no distress.  Will monitor.  Likely  due to aspiration pneumonia.  6.  History of aortic stenosis at least moderate per recent echocardiogram.  Outpatient cardiology follow-up.  Aortic valve area is 1.34 cm and V-max is 3.27 m/s.  Continue beta-blocker.  Avoid sudden preload reduction.   7.  Essential hypertension.  On beta-blocker, dose adjusted as he was slightly bradycardic, added Norvasc as well for better control.  8.  Dehydration due to diarrhea.  Diarrhea seems to have resolved. Improved after IV fluids.  9.  CKD 3A.  Baseline creatinine around 1.4.  Monitor.  10. Chronic iron deficiency anemia.  No need for transfusion outpatient PCP age-appropriate work-up and follow-up.  11.  Elevated D-dimer.  Likely due to infection and inflammation, negative lower extremity ultrasound.  No clinical signs or symptoms of PE, increase Lovenox dose  and monitor.  Most likely D-dimer high due to inflammation from aspiration pneumonia.  12.  Hypokalemia.  Replaced.    13. DM type II.  On sliding scale.  Joselyn Arrow CBG (last 3)  Recent Labs    08/14/20 0436 08/14/20 0757 08/14/20 1131  GLUCAP 109* 119* 202*      Condition -  Guarded  Family Communication  :    Shella Spearing - [phone] - 08/10/20  Jolaine Artist [phone] on 08/10/2020 - called twice, 9:48 AM full mailbox.  Updated on 08/11/20, 08/13/2020  Also number listed for wife [name] [phone] has full mailbox on 08/10/2020, 08/14/20 - full mailbox at 11:50 AM   Code Status :  Full  Consults  :  VVS  Procedures  :    CT Head - Non Acute  Leg [place] - No DVT  Recent TTE 05/2020 - 1. Left ventricular ejection fraction, by estimation, is 70 to 75%. The left ventricle has hyperdynamic function. The left ventricle has no regional wall motion abnormalities. There is moderate left ventricular hypertrophy. Left ventricular diastolic parameters are consistent with Grade I diastolic dysfunction (impaired relaxation).  2. Right ventricular systolic function is normal. The  right ventricular size is normal.  3. The mitral valve is normal in structure. No evidence of mitral valve regurgitation. No evidence of mitral stenosis.  4. The aortic valve has an indeterminant number of cusps. Aortic valve regurgitation is not visualized. Moderate aortic valve stenosis. Aortic valve area, by VTI measures 1.34 cm. Aortic valve mean gradient measures  23.8 mmHg. Aortic valve Vmax measures 3.27 m/s.  5. The inferior vena cava is normal in size with greater than 50% respiratory variability, suggesting right atrial pressure of 3 mmHg.    PUD Prophylaxis : PPI  Disposition Plan  : Note patient is likely to be medically stable for discharge in 1 to 2 days however there is no safe discharge plan, he is refusing SNF, his wife has bilateral amputation and is clearly unable to take care of him, he was being attended by his son [name] who does not want to be involved in his care for a total of 2 weeks due to Covid exposure.  Will defer placement and safe discharge to social work/case management.   Status is: Inpatient  Remains inpatient appropriate because:IV treatments appropriate due to intensity of illness or inability to take PO   Dispo: The patient is from: Home              Anticipated d/c is to: Home              Anticipated d/c date is: > 3 days              Patient currently is not medically stable to d/c.   DVT Prophylaxis  :  Lovenox   Lab Results  Component Value Date   PLT 293 08/14/2020    Diet :  Diet Order            Diet regular Room service appropriate? Yes; Fluid consistency: Thin  Diet effective now                  Inpatient Medications  Scheduled Meds: . amLODipine  10 mg Oral Daily  . vitamin C  500 mg Oral Daily  . aspirin  81 mg Oral Daily  . atorvastatin  20 mg Oral Daily  . brimonidine  1 drop Both Eyes BID  . clopidogrel  75 mg Oral Q breakfast  .  enoxaparin (LOVENOX) injection  40 mg Subcutaneous Q12H  . feeding supplement  (ENSURE ENLIVE)  237 mL Oral BID BM  . ferrous sulfate  325 mg Oral BID WC  . insulin aspart  0-9 Units Subcutaneous Q4H  . metoprolol succinate  25 mg Oral Daily  . nutrition supplement (JUVEN)  1 packet Oral BID BM  . pantoprazole  40 mg Oral Daily  . potassium chloride  40 mEq Oral Once  . sodium chloride flush  3 mL Intravenous Q12H  . timolol  1 drop Both Eyes BID  . zinc sulfate  220 mg Oral Daily   Continuous Infusions: . ampicillin-sulbactam (UNASYN) IV 3 g (08/14/20 1005)   PRN Meds:.acetaminophen, guaiFENesin-dextromethorphan, [DISCONTINUED] ondansetron **OR** ondansetron (ZOFRAN) IV, traMADol  Antibiotics  :    Anti-infectives (From admission, onward)   Start     Dose/Rate Route Frequency Ordered Stop   08/11/20 1000  remdesivir 100 mg in sodium chloride 0.9 % 100 mL IVPB       "Followed by" Linked Group Details   100 mg 200 mL/hr over 30 Minutes Intravenous Daily 08/09/20 2256 08/14/20 0955   08/10/20 2000  azithromycin (ZITHROMAX) 500 mg in sodium chloride 0.9 % 250 mL IVPB  Status:  Discontinued        500 mg 250 mL/hr over 60 Minutes Intravenous Every 24 hours 08/09/20 2256 08/12/20 1042   08/10/20 1800  cefTRIAXone (ROCEPHIN) 2 g in sodium chloride 0.9 % 100 mL IVPB  Status:  Discontinued        2 g 200 mL/hr over 30 Minutes Intravenous Every 24 hours 08/09/20 2256 08/09/20 2332   08/10/20 0015  Ampicillin-Sulbactam (UNASYN) 3 g in sodium chloride 0.9 % 100 mL IVPB        3 g 200 mL/hr over 30 Minutes Intravenous Every 8 hours 08/10/20 0002 08/16/20 2359   08/10/20 0000  remdesivir 200 mg in sodium chloride 0.9% 250 mL IVPB       "Followed by" Linked Group Details   200 mg 580 mL/hr over 30 Minutes Intravenous Once 08/09/20 2256 08/10/20 0250   08/09/20 2030  cefTRIAXone (ROCEPHIN) 1 g in sodium chloride 0.9 % 100 mL IVPB        1 g 200 mL/hr over 30 Minutes Intravenous  Once 08/09/20 2020 08/09/20 2139   08/09/20 2030  azithromycin (ZITHROMAX) 500 mg in sodium  chloride 0.9 % 250 mL IVPB        500 mg 250 mL/hr over 60 Minutes Intravenous  Once 08/09/20 2020 08/09/20 2140       Time Spent in minutes  30   Susa Raring M.D on 08/14/2020 at 11:48 AM  To page go to [email or web address] - password [facility]  [facility] -  Office  [phone]    See all Orders from today for further details    Objective:   Vitals:   08/13/20 2059 08/13/20 2200 08/14/20 0437 08/14/20 0759  BP: (!) 178/59 (!) 154/61 (!) 180/61 (!) 165/61  Pulse:  (!) 53 (!) 56 (!) 58  Resp: 13 13 12 17   Temp: 98.3 F (36.8 C)  98.4 F (36.9 C) 98.2 F (36.8 C)  TempSrc: Oral  Oral Oral  SpO2: 100% 99% 99% 98%  Weight:      Height:        Wt Readings from Last 3 Encounters:  08/10/20 61.6 kg  06/25/20 72.6 kg  06/19/20 72.6 kg     Intake/Output Summary (Last 24 hours) at 08/14/2020  1148 Last data filed at 08/14/2020 0900 Gross per 24 hour  Intake 1145.33 ml  Output --  Net 1145.33 ml     Physical Exam  Awake Alert, No new F.N deficits, Normal affect [place].AT,PERRAL Supple Neck,No JVD, No cervical lymphadenopathy appriciated.  Symmetrical Chest wall movement, Good air movement bilaterally, CTAB RRR,No Gallops, Rubs or new Murmurs, No Parasternal Heave +ve B.Sounds, Abd Soft, No tenderness, No organomegaly appriciated, No rebound - guarding or rigidity. No Cyanosis, R-AKA, L heel old arterial insufficiency ulcer POA, surgical boot in place    Data Review:    CBC Recent Labs  Lab 08/10/20 0320 08/10/20 0320 08/10/20 1507 08/11/20 0214 08/12/20 1323 08/13/20 0402 08/14/20 0859  WBC 22.3*  --   --  16.4* 15.7* 11.0* 12.7*  HGB 9.1*   < > 8.8* 7.6* 9.3* 9.4* 10.6*  HCT 28.2*   < > 26.0* 23.8* 28.3* 28.8* 32.1*  PLT 226  --   --  206 270 258 293  MCV 85.5  --   --  83.8 84.5 83.5 83.2  MCH 27.6  --   --  26.8 27.8 27.2 27.5  MCHC 32.3  --   --  31.9 32.9 32.6 33.0  RDW 13.7  --   --  13.8 14.1 14.0 13.9  LYMPHSABS 0.9  --   --  1.6 1.0 2.1 2.0   MONOABS 0.9  --   --  1.5* 0.3 0.9 1.1*  EOSABS 0.0  --   --  0.0 0.0 0.0 0.0  BASOSABS 0.1  --   --  0.0 0.0 0.0 0.0   < > = values in this interval not displayed.    Recent Labs  Lab 08/09/20 1944 08/09/20 2101 08/09/20 2156 08/10/20 0033 08/10/20 0033 08/10/20 0320 08/10/20 0320 08/10/20 1453 08/10/20 1507 08/11/20 0214 08/12/20 1323 08/13/20 0402 08/14/20 0859  NA 133*  --    < >  --   --  133*   < >  --  136 135 136 139 138  K 4.4  --    < >  --   --  4.1   < >  --  4.0 4.0 3.9 3.7 3.3*  CL 97*  --    < >  --   --  96*  --   --   --  100 102 104 103  CO2 26  --    < >  --   --  25  --   --   --  GLUCOSE 150*  --    < >  --   --  173*  --   --   --  121* 237* 112* 155*  BUN 13  --    < >  --   --  15  --   --   --  29* 30* 40* 35*  CREATININE 1.44*  --    < >  --   --  1.46*  --   --   --  1.45* 1.43* 1.46* 1.37*  CALCIUM 8.9  --    < >  --   --  8.5*  --   --   --  8.1* 8.2* 8.6* 8.9  AST 18  --    < >  --   --  19  --   --   --  23 33 25 22  ALT 17  --    < >  --   --  18  --   --   --  17 30 29  35  ALKPHOS 91  --    < >  --   --  87  --   --   --  67 71 72 77  BILITOT 0.4  --    < >  --   --  0.5  --   --   --  0.5 0.4 0.5 0.5  ALBUMIN 2.6*  --    < >  --   --  2.3*  --   --   --  1.9* 2.1* 2.2* 2.6*  MG  --   --   --   --   --  1.7  --   --   --  1.9 1.8 2.0 1.9  CRP  --   --    < >  --    < > 13.2*  --   --   --  17.9* 8.9* 5.9* 3.2*  DDIMER  --   --    < >  --    < > 3.06*  --   --   --  1.84* 2.77* 2.14* 3.09*  PROCALCITON  --   --    < > 0.63   < > 0.87  --   --   --  1.34 0.58 0.44 0.15  LATICACIDVEN 1.3 0.9  --   --   --   --   --   --   --   --   --   --   --   INR 1.2  --   --   --   --   --   --   --   --   --   --   --   --   HGBA1C  --   --   --  6.0*  --   --   --   --   --   --   --   --   --   BNP  --   --   --   --   --   --   --  400.1*  --  380.7* 440.7* 332.9* 284.3*   < > = values in this interval not displayed.     ------------------------------------------------------------------------------------------------------------------ No results for input(s): CHOL, HDL, LDLCALC, TRIG, CHOLHDL, LDLDIRECT in the last 72 hours.  Lab Results  Component Value Date   HGBA1C 6.0 (H) 08/10/2020   ------------------------------------------------------------------------------------------------------------------ No results for input(s): TSH, T4TOTAL, T3FREE, THYROIDAB in the last 72 hours.  Invalid input(s): FREET3  Cardiac Enzymes No results for input(s): CKMB, TROPONINI, MYOGLOBIN in the last 168 hours.  Invalid input(s): CK ------------------------------------------------------------------------------------------------------------------    Component Value Date/Time   BNP 284.3 (H) 08/14/2020 1610    Micro Results Recent Results (from the past 240 hour(s))  Blood culture (routine single)     Status: None (Preliminary result)   Collection Time: 08/09/20  7:28 PM   Specimen: BLOOD  Result Value Ref Range Status   Specimen Description BLOOD RIGHT ANTECUBITAL  Final   Special Requests   Final    BOTTLES DRAWN AEROBIC ONLY Blood Culture results may not be optimal due to an inadequate volume of blood received in culture bottles   Culture   Final    NO GROWTH 4 DAYS Performed at [facility], [address], [place], [place] [address]    Report Status PENDING  Incomplete  SARS Coronavirus 2 by RT PCR (  hospital order, performed in [facility] hospital lab) Nasopharyngeal Nasopharyngeal Swab     Status: Abnormal   Collection Time: 08/09/20  7:56 PM   Specimen: Nasopharyngeal Swab  Result Value Ref Range Status   SARS Coronavirus 2 POSITIVE (A) NEGATIVE Final    Comment: RESULT CALLED TO, READ BACK BY AND VERIFIED WITH: B,OSORIO @2154  08/09/20 EB (NOTE) SARS-CoV-2 target nucleic acids are DETECTED  SARS-CoV-2 RNA is generally detectable in upper respiratory specimens  during the acute phase of  infection.  Positive results are indicative  of the presence of the identified virus, but do not rule out bacterial infection or co-infection with other pathogens not detected by the test.  Clinical correlation with patient history and  other diagnostic information is necessary to determine patient infection status.  The expected result is negative.  Fact Sheet for Patients:   BoilerBrush.com.cy   Fact Sheet for Healthcare Providers:   https://pope.com/    This test is not yet approved or cleared by the [place] FDA and  has been authorized for detection and/or diagnosis of SARS-CoV-2 by FDA under an Emergency Use Authorization (EUA).  This EUA will remain in effect (meaning this test can be  used) for the duration of  the COVID-19 declaration under Section 564(b)(1) of the Act, 21 U.S.C. section 360-bbb-3(b)(1), unless the authorization is terminated or revoked sooner.  Performed at [facility], [address] [place], [place] [address]   Urine culture     Status: None   Collection Time: 08/09/20 10:26 PM   Specimen: In/Out Cath Urine  Result Value Ref Range Status   Specimen Description IN/OUT CATH URINE  Final   Special Requests NONE  Final   Culture   Final    NO GROWTH Performed at [facility], [address], [place], [place] [address]    Report Status 08/10/2020 FINAL  Final  Culture, blood (Routine X 2) w Reflex to ID Panel     Status: None (Preliminary result)   Collection Time: 08/10/20  3:18 AM   Specimen: BLOOD LEFT HAND  Result Value Ref Range Status   Specimen Description BLOOD LEFT HAND  Final   Special Requests   Final    BOTTLES DRAWN AEROBIC AND ANAEROBIC Blood Culture adequate volume   Culture   Final    NO GROWTH 3 DAYS Performed at [facility], [address], [place], [place] [address]    Report Status PENDING  Incomplete  Culture, blood (Routine X 2) w Reflex to ID Panel      Status: None (Preliminary result)   Collection Time: 08/10/20  2:53 PM   Specimen: BLOOD  Result Value Ref Range Status   Specimen Description BLOOD SITE NOT SPECIFIED  Final   Special Requests   Final    BOTTLES DRAWN AEROBIC AND ANAEROBIC Blood Culture adequate volume   Culture   Final    NO GROWTH 3 DAYS Performed at [facility], [address] [place], [place] [address]    Report Status PENDING  Incomplete  MRSA PCR Screening     Status: None   Collection Time: 08/10/20 10:00 PM   Specimen: Nasal Mucosa; Nasopharyngeal  Result Value Ref Range Status   MRSA by PCR NEGATIVE NEGATIVE Final    Comment:        The GeneXpert MRSA Assay (FDA approved for NASAL specimens only), is one component of a comprehensive MRSA colonization surveillance program. It is not intended  to diagnose MRSA infection nor to guide or monitor treatment for MRSA infections. Performed at [facility], [address] [place], [place] [address]     Radiology Reports CT HEAD WO CONTRAST  Result Date: 08/09/2020 CLINICAL DATA:  Delirium EXAM: CT HEAD WITHOUT CONTRAST TECHNIQUE: Contiguous axial images were obtained from the base of the skull through the vertex without intravenous contrast. COMPARISON:  06/25/2020 FINDINGS: Brain: Normal anatomic configuration. Parenchymal volume loss is commensurate with the patient's age. Mild-to-moderate periventricular white matter changes are present likely reflecting the sequela of small vessel ischemia. No abnormal intra or extra-axial mass lesion or fluid collection. No abnormal mass effect or midline shift. No evidence of acute intracranial hemorrhage or infarct. Ventricular size is normal. Cerebellum unremarkable. Vascular: Extensive calcifications are seen within the carotid siphons and distal vertebral arteries bilaterally. No asymmetric hyperdense vasculature at the skull base. Skull: Intact Sinuses/Orbits: The paranasal sinuses are clear. Intraorbital  surgical implant is seen along the lateral aspect of the right ocular globe. The orbits are otherwise unremarkable. Other: Mastoid air cells and middle ear cavities are clear. IMPRESSION: 1. No acute intracranial abnormality. 2. Mild-to-moderate periventricular white matter changes likely reflecting the sequela of small vessel ischemia. Electronically Signed   By: Helyn NumbersAshesh  Parikh MD   On: 08/09/2020 23:46   CT ANGIO CHEST PE W OR WO CONTRAST  Result Date: 08/10/2020 CLINICAL DATA:  Altered mental status with cough and diarrhea for 1 week. Pulmonary embolism suspected. EXAM: CT ANGIOGRAPHY CHEST WITH CONTRAST TECHNIQUE: Multidetector CT imaging of the chest was performed using the standard protocol during bolus administration of intravenous contrast. Multiplanar CT image reconstructions and MIPs were obtained to evaluate the vascular anatomy. CONTRAST:  55mL OMNIPAQUE IOHEXOL 350 MG/ML SOLN COMPARISON:  Radiographs 08/09/2020. Noncontrast chest CT 11/05/2018. FINDINGS: Cardiovascular: The pulmonary arteries are well opacified with contrast to the level of the subsegmental branches. There is no evidence of acute pulmonary embolism. There is a possible small linear filling defect within the right lower lobe segmental pulmonary artery (image 51/5) which could relate to previous pulmonary embolism. There is limited luminal opacification of the systemic vessels without evidence of acute findings. There is diffuse atherosclerosis of the aorta, great vessels and coronary arteries. Aortic valvular calcifications are present. The heart size is normal. There is no pericardial effusion. Mediastinum/Nodes: There are no enlarged mediastinal, hilar or axillary lymph nodes. The thyroid gland, trachea and esophagus demonstrate no significant findings. Lungs/Pleura: Chronic dependent right pleural effusion is associated with pleural thickening and has mildly progressed compared with the prior CT. There is resulting increased  compressive atelectasis of the right middle and lower lobes. The lower lobe is completely collapsed, and there is abrupt cut off of the right lower and middle lobe bronchi proximally. Right middle lobe atelectasis may obscure the previously demonstrated right middle lobe nodular density. There is new patchy right upper lobe airspace disease, suspicious for pneumonia. The left lung is clear. There are small calcified pleural plaques bilaterally. Upper abdomen: The visualized upper abdomen appears stable without acute findings. Musculoskeletal/Chest wall: There is no chest wall mass or suspicious osseous finding. Review of the MIP images confirms the above findings. IMPRESSION: 1. No evidence of acute pulmonary embolism. Possible small linear filling defect within the right lower lobe segmental pulmonary artery could relate to previous pulmonary embolism. 2. Enlarging chronic right pleural effusion with increased compressive atelectasis of the right middle and lower lobes and associated central bronchial occlusion. Underlying neoplasm cannot be excluded. Further  evaluation recommended with thoracentesis and possible bronchoscopy. 3. New patchy right upper lobe airspace disease, suspicious for pneumonia. Radiographic follow up recommended. 4. 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 5. Aortic Atherosclerosis (ICD10-I70.0). Electronically Signed   By: Carey Bullocks M.D.   On: 08/10/2020 11:50   DG Chest Port 1 View  Result Date: 08/09/2020 CLINICAL DATA:  Questionable sepsis EXAM: PORTABLE CHEST 1 VIEW COMPARISON:  Chest x-ray 06/25/2020 FINDINGS: The heart size and mediastinal contours are unchanged. Aortic arch calcifications. Interval development of hazy airspace opacity within the right lower lobe. No pulmonary edema. Similar-appearing at least small to moderate volume right pleural effusion. No left pleural effusion. No pneumothorax. No acute osseous abnormality.  IMPRESSION: Stable small to moderate right pleural effusion with interval increase in right lower lobe opacity that may represent inflammation/infection versus passive atelectasis. Electronically Signed   By: Tish Frederickson M.D.   On: 08/09/2020 20:14   DG Foot 2 Views Left  Result Date: 08/09/2020 CLINICAL DATA:  Diabetic left ankle ulceration, EXAM: LEFT FOOT - 2 VIEW COMPARISON:  06/25/2020 FINDINGS: Frontal and lateral views of the left foot are obtained. Stable osteopenia. No acute fracture, subluxation, or dislocation. Marked joint space narrowing and osteophyte formation within the midfoot, consistent with osteoarthritis or developing Charcot arthropathy. Large inferior calcaneal spur unchanged. No radiographic evidence of osteomyelitis. There is progressive dorsal soft tissue swelling of the forefoot. IMPRESSION: 1. Progressive soft tissue swelling of the forefoot. 2. Stable degenerative changes.  No acute or destructive process. Electronically Signed   By: Sharlet Salina M.D.   On: 08/09/2020 23:16   VAS [place] ABI WITH/WO TBI  Result Date: 07/20/2020 LOWER EXTREMITY DOPPLER STUDY Indications: Right AKA. High Risk Factors: Hypertension, hyperlipidemia, Diabetes.  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 Limitations: Today's exam was limited due to patient in wheelchair and Had to              lift patient to bed. Comparison Study: 05/11/20 Performing Technologist: Jeb Levering RDMS, RVT  Examination Guidelines: A complete evaluation includes at minimum, Doppler waveform signals and systolic blood pressure reading at the level of bilateral brachial, anterior tibial, and posterior tibial arteries, when vessel segments are accessible. Bilateral testing is considered an integral part of a complete examination. Photoelectric Plethysmograph (PPG)  waveforms and toe systolic pressure readings are included as required and additional duplex testing as needed. Limited examinations for reoccurring indications may be performed as noted.  ABI Findings: +--------+------------------+-----+--------+--------+ Right   Rt Pressure (mmHg)IndexWaveformComment  +--------+------------------+-----+--------+--------+ [id number]                                     +--------+------------------+-----+--------+--------+ +---------+------------------+-----+--------+----------------------------------+ Left     Lt Pressure (mmHg)IndexWaveformComment                            +---------+------------------+-----+--------+----------------------------------+ Brachial  not obtained due to clothing                                               interference                       +---------+------------------+-----+--------+----------------------------------+ ATA      165               0.77 biphasic                                   +---------+------------------+-----+--------+----------------------------------+ PTA      188               0.87 biphasic                                   +---------+------------------+-----+--------+----------------------------------+ Great Toe103               0.48 Abnormal                                   +---------+------------------+-----+--------+----------------------------------+ +-------+-----------+-----------+------------+------------+ ABI/TBIToday's ABIToday's TBIPrevious ABIPrevious TBI +-------+-----------+-----------+------------+------------+ Left   0.87       0.48       0.68        0.43         +-------+-----------+-----------+------------+------------+   Summary: Left: 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 The left toe-brachial index is abnormal.  *See table(s) above for measurements and observations.   Electronically signed by Lemar Livings MD on 07/20/2020 at 9:52:26 AM.    Final    VAS [place] LOWER EXTREMITY VENOUS (DVT)  Result Date: 08/12/2020  Lower Venous DVTStudy Indications: Covid-19, Elevated D-Dimer.  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 Comparison Study: No prior study on file Performing Technologist: Sherren Kerns RVS  Examination Guidelines: A complete evaluation includes B-mode imaging, spectral Doppler, color Doppler, and power Doppler as needed of all accessible portions of each vessel. Bilateral testing is considered an integral part of a complete examination. Limited examinations for reoccurring indications may be performed as noted. The reflux portion of the exam is performed with the patient in reverse Trendelenburg.  +---------+---------------+---------+-----------+----------+--------------+ RIGHT    CompressibilityPhasicitySpontaneityPropertiesThrombus Aging +---------+---------------+---------+-----------+----------+--------------+ CFV      Full           Yes      Yes                                 +---------+---------------+---------+-----------+----------+--------------+ SFJ      Full                                                        +---------+---------------+---------+-----------+----------+--------------+ FV Prox  Full                                                        +---------+---------------+---------+-----------+----------+--------------+  FV Mid   Full                                                        +---------+---------------+---------+-----------+----------+--------------+ FV DistalFull                                                        +---------+---------------+---------+-----------+----------+--------------+ PFV      Full                                                        +---------+---------------+---------+-----------+----------+--------------+ POP                                                    AKA            +---------+---------------+---------+-----------+----------+--------------+ PTV                                                   AKA            +---------+---------------+---------+-----------+----------+--------------+ PERO                                                  AKA            +---------+---------------+---------+-----------+----------+--------------+   +---------+---------------+---------+-----------+----------+-------------------+ LEFT     CompressibilityPhasicitySpontaneityPropertiesThrombus Aging      +---------+---------------+---------+-----------+----------+-------------------+ CFV      Full           Yes      Yes                                      +---------+---------------+---------+-----------+----------+-------------------+ SFJ      Full                                                             +---------+---------------+---------+-----------+----------+-------------------+ FV Prox  Full                                                             +---------+---------------+---------+-----------+----------+-------------------+ FV Mid   Full                                                             +---------+---------------+---------+-----------+----------+-------------------+  FV Distal               Yes      Yes                  patent by color and                                                       Doppler             +---------+---------------+---------+-----------+----------+-------------------+ POP      Full           Yes      Yes                                      +---------+---------------+---------+-----------+----------+-------------------+ PTV      Full                                                             +---------+---------------+---------+-----------+----------+-------------------+ PERO     Full                                                              +---------+---------------+---------+-----------+----------+-------------------+     Summary: RIGHT: - There is no evidence of deep vein thrombosis in the lower extremity.  LEFT: - There is no evidence of deep vein thrombosis in the lower extremity.  *See table(s) above for measurements and observations. Electronically signed by Lemar Livings MD on 08/12/2020 at 11:03:46 AM.    Final

## 2020-08-14 NOTE — Progress Notes (Signed)
Physical Therapy Treatment Patient Details Name: Jeremiah [name] MRN: [id number] DOB: 1945-12-06 Today's Date: 08/14/2020    History of Present Illness 74 yo male with L foot pain was found to be Covid positive.  Pt had L foot revascularization done 06/20/20.  Note a heel wound as well on LLE.  PMHx:  R AKA, DM, PVD, HTN, atherosclerosis, C-diff, gout, PNA, sleep apnea,     PT Comments    Pt supine on arrival, agreeable to therapy session with fair participation and tolerance for session. Pt presents with anxiety regarding mobility, slow to process cues and initiate movements and needed multimodal cues to perform supine UE/LE exercises; pt refused OOB/EOB mobility 2/2 recent bowel incontinence. Pt reports 10/10 RPE (Fatigue) at end of session. Pt performed supine BUE/BLE exercises with fair tolerance as detailed below, using theraband for UE exercises and AAROM for LLE straight leg raises/hip abduction. Pt continues to benefit from PT services to progress toward functional mobility goals. Continue to recommend SNF level of rehab.   Follow Up Recommendations  SNF     Equipment Recommendations  None recommended by PT    Recommendations for Other Services       Precautions / Restrictions Precautions Precautions: Fall Precaution Comments: R AKA Required Braces or Orthoses: Other Brace Other Brace: RLE prosthesis    Mobility  Bed Mobility  Balance Overall balance assessment: Needs assistance               Cognition Arousal/Alertness: Awake/alert Behavior During Therapy: WFL for tasks assessed/performed Overall Cognitive Status: Within Functional Limits for tasks assessed Area of Impairment: Awareness;Problem solving;Following commands;Safety/judgement              Following Commands: Follows multi-step commands with increased time;Follows one step commands with increased time Safety/Judgement: Decreased awareness of safety;Decreased awareness of deficits   Problem  Solving: Slow processing;Difficulty sequencing;Requires verbal cues;Decreased initiation;Requires tactile cues General Comments: pt refusing out of bed transfers 2/2 bowel incontinence; increased time to initiate and perform supine exercise tasks; A&O x4      Exercises General Exercises - Upper Extremity Shoulder Horizontal ABduction: AROM;Strengthening;Both;Theraband;Supine Theraband Level (Shoulder Horizontal Abduction): Level 2 (Red) Elbow Extension: AROM;Strengthening;Theraband;10 reps;Left;Right;Supine Theraband Level (Elbow Extension): Level 2 (Red) General Exercises - Lower Extremity Ankle Circles/Pumps: AROM;Left;10 reps;Supine Short Arc Quad: AROM;Left;10 reps;Supine Hip ABduction/ADduction: AROM;Right;Left;10 reps;Supine;AAROM (AA on LLE to prevent heel from dragging on bed) Straight Leg Raises: AAROM;10 reps;Left;AROM;Right;Supine (AA on LLE and AROM on RLE) Other Exercises Other Exercises: Also performed hip adduction pillow squeezes supine 3-5 sec hold x10 reps B    General Comments General comments (skin integrity, edema, etc.): pt refused EOB/OOB mobility 2/2 recent bowel incontinence, supine exercise session only      Pertinent Vitals/Pain Pain Assessment: Faces Faces Pain Scale: Hurts a little bit Pain Location: LLE Pain Descriptors / Indicators: Grimacing;Guarding Pain Intervention(s): Limited activity within patient's tolerance;Monitored during session;Repositioned (L heel floated and prevalon boot donned at end of session)           PT Goals (current goals can now be found in the care plan section) Acute Rehab PT Goals Patient Stated Goal: to get stronger and have enough help PT Goal Formulation: With patient Time For Goal Achievement: 08/24/20 Potential to Achieve Goals: Good Progress towards PT goals: Progressing toward goals    Frequency    Min 3X/week      PT Plan Current plan remains appropriate    AM-PAC PT "6 Clicks" Mobility   Outcome  Measure  Help needed turning from your back to your side while in a flat bed without using bedrails?: A Lot Help needed moving from lying on your back to sitting on the side of a flat bed without using bedrails?: A Lot Help needed moving to and from a bed to a chair (including a wheelchair)?: Total Help needed standing up from a chair using your arms (e.g., wheelchair or bedside chair)?: Total Help needed to walk in hospital room?: Total Help needed climbing 3-5 steps with a railing? : Total 6 Click Score: 8    End of Session   Activity Tolerance: Patient limited by fatigue;Treatment limited secondary to medical complications (Comment) Patient left: in bed;with call bell/phone within reach;with bed alarm set (L heel offloaded with pillow under calf and Prevalon boot donned, pillow behind R back to offload pressure) Nurse Communication: Mobility status PT Visit Diagnosis: Other abnormalities of gait and mobility (R26.89);Muscle weakness (generalized) (M62.81);Difficulty in walking, not elsewhere classified (R26.2);Adult, failure to thrive (R62.7)     Time: 0865-7846 PT Time Calculation (min) (ACUTE ONLY): 25 min  Charges:  $Therapeutic Exercise: 23-37 mins                     Amilyah Nack P., PTA Acute Rehabilitation Services Pager: please message in Secure chat, pager non-functional. Office: [phone]   Dorathy Kinsman Caedin Mogan 08/14/2020, 5:18 PM

## 2020-08-14 NOTE — TOC Progression Note (Addendum)
Transition of Care [facility]) - Progression Note    Patient Details  Name: WAQAS BRUHL MRN: [id number] Date of Birth: [birth year]  [facility] CM/SW Contact  Mearl Latin, LCSW Phone Number: 08/14/2020, 9:01 AM  Clinical Narrative:    9am-CSW spoke with patient's spouse, [name], regarding SNF placement. She reports that he does not want to go to SNF and she does not want him to either. CSW explained that he needs maximum assistance at this time and [name] stated that she goes to dialysis. CSW inquired as to who would stay with the patient while she went to dialysis. She stated that her son would come and stay with him. CSW emphasized that he would require a lot of care but she stated that they would be fine at home; she will consult with her nurse and call CSW back. [facility] should have a bed available Wednesday for patient if they decide on SNF. Patient is currently active with [facility] for RN and OT.   12pm-CSW received return call from patient's spouse. She reported that patient is now agreeable to rehab at [facility], as her nurse from [facility] recommended it. CSW will make [facility] aware and submit clinicals to insurance. Patient's son also updated.   Expected Discharge Plan: Home w Home Health Services Barriers to Discharge: Continued Medical Work up  Expected Discharge Plan and Services Expected Discharge Plan: Home w Home Health Services In-house Referral: Clinical Social Work     Living arrangements for the past 2 months: Single Family Home                                       Social Determinants of Health (SDOH) Interventions    Readmission Risk Interventions Readmission Risk Prevention Plan 08/13/2020 07/08/2018 07/08/2018  Transportation Screening Complete - Complete  Medication Review Oceanographer) Referral to Pharmacy - Complete  PCP or Specialist appointment within 3-5 days of discharge Complete - Not Complete  HRI or Home  Care Consult Complete (No Data) Complete  SW Recovery Care/Counseling Consult Complete Patient refused Not Complete  Palliative Care Screening Not Applicable - Not Complete  Comments - - (No Data)  Medication Reconcilation (Pharmacy) - - Not Complete  Skilled Nursing Facility Complete - Patient refused  Some recent data might be hidden

## 2020-08-15 ENCOUNTER — Inpatient Hospital Stay (HOSPITAL_COMMUNITY): Payer: Medicare Other

## 2020-08-15 DIAGNOSIS — G9341 Metabolic encephalopathy: Secondary | ICD-10-CM

## 2020-08-15 LAB — GLUCOSE, CAPILLARY
Glucose-Capillary: 122 mg/dL — ABNORMAL HIGH (ref 70–99)
Glucose-Capillary: 137 mg/dL — ABNORMAL HIGH (ref 70–99)
Glucose-Capillary: 141 mg/dL — ABNORMAL HIGH (ref 70–99)
Glucose-Capillary: 146 mg/dL — ABNORMAL HIGH (ref 70–99)
Glucose-Capillary: 214 mg/dL — ABNORMAL HIGH (ref 70–99)
Glucose-Capillary: 81 mg/dL (ref 70–99)

## 2020-08-15 LAB — CBC WITH DIFFERENTIAL/PLATELET
Abs Immature Granulocytes: 0.04 10*3/uL (ref 0.00–0.07)
Basophils Absolute: 0 10*3/uL (ref 0.0–0.1)
Basophils Relative: 0 %
Eosinophils Absolute: 0.2 10*3/uL (ref 0.0–0.5)
Eosinophils Relative: 2 %
HCT: 25.6 % — ABNORMAL LOW (ref 39.0–52.0)
Hemoglobin: 8.3 g/dL — ABNORMAL LOW (ref 13.0–17.0)
Immature Granulocytes: 0 %
Lymphocytes Relative: 18 %
Lymphs Abs: 2 10*3/uL (ref 0.7–4.0)
MCH: 26.9 pg (ref 26.0–34.0)
MCHC: 32.4 g/dL (ref 30.0–36.0)
MCV: 82.8 fL (ref 80.0–100.0)
Monocytes Absolute: 1.1 10*3/uL — ABNORMAL HIGH (ref 0.1–1.0)
Monocytes Relative: 9 %
Neutro Abs: 8 10*3/uL — ABNORMAL HIGH (ref 1.7–7.7)
Neutrophils Relative %: 71 %
Platelets: 219 10*3/uL (ref 150–400)
RBC: 3.09 MIL/uL — ABNORMAL LOW (ref 4.22–5.81)
RDW: 14 % (ref 11.5–15.5)
WBC: 11.3 10*3/uL — ABNORMAL HIGH (ref 4.0–10.5)
nRBC: 0 % (ref 0.0–0.2)

## 2020-08-15 LAB — BASIC METABOLIC PANEL
Anion gap: 8 (ref 5–15)
BUN: 29 mg/dL — ABNORMAL HIGH (ref 8–23)
CO2: 25 mmol/L (ref 22–32)
Calcium: 8 mg/dL — ABNORMAL LOW (ref 8.9–10.3)
Chloride: 104 mmol/L (ref 98–111)
Creatinine, Ser: 1.42 mg/dL — ABNORMAL HIGH (ref 0.61–1.24)
GFR calc Af Amer: 55 mL/min — ABNORMAL LOW (ref >60)
GFR calc non Af Amer: 48 mL/min — ABNORMAL LOW (ref >60)
Glucose, Bld: 157 mg/dL — ABNORMAL HIGH (ref 70–99)
Potassium: 2.9 mmol/L — ABNORMAL LOW (ref 3.5–5.1)
Sodium: 137 mmol/L (ref 135–145)

## 2020-08-15 LAB — CULTURE, BLOOD (ROUTINE X 2)
Culture: NO GROWTH
Culture: NO GROWTH
Special Requests: ADEQUATE
Special Requests: ADEQUATE

## 2020-08-15 LAB — C-REACTIVE PROTEIN: CRP: 2.1 mg/dL — ABNORMAL HIGH (ref <1.0)

## 2020-08-15 LAB — MAGNESIUM: Magnesium: 1.9 mg/dL (ref 1.7–2.4)

## 2020-08-15 LAB — D-DIMER, QUANTITATIVE: D-Dimer, Quant: 2.29 ug/mL-FEU — ABNORMAL HIGH (ref 0.00–0.50)

## 2020-08-15 MED ORDER — ENSURE ENLIVE PO LIQD
237.0000 mL | Freq: Three times a day (TID) | ORAL | Status: DC
  Administered 2020-08-15 – 2020-08-21 (×15): 237 mL via ORAL

## 2020-08-15 MED ORDER — VITAMIN B-12 1000 MCG PO TABS
1000.0000 ug | ORAL_TABLET | Freq: Every day | ORAL | Status: DC
  Administered 2020-08-16 – 2020-08-21 (×6): 1000 ug via ORAL
  Filled 2020-08-15 (×6): qty 1

## 2020-08-15 MED ORDER — POTASSIUM CHLORIDE CRYS ER 20 MEQ PO TBCR
40.0000 meq | EXTENDED_RELEASE_TABLET | Freq: Once | ORAL | Status: AC
  Administered 2020-08-15: 40 meq via ORAL
  Filled 2020-08-15: qty 2

## 2020-08-15 MED ORDER — ENOXAPARIN SODIUM 40 MG/0.4ML ~~LOC~~ SOLN
40.0000 mg | SUBCUTANEOUS | Status: DC
  Administered 2020-08-16 – 2020-08-19 (×3): 40 mg via SUBCUTANEOUS
  Filled 2020-08-15 (×5): qty 0.4

## 2020-08-15 NOTE — Progress Notes (Signed)
PROGRESS NOTE    Jeremiah [name]  YQM:[id number] DOB: 1946/04/11 DOA: 08/09/2020 PCP: Annita BrodAsenso, Philip, MD   Chief Complaint  Patient presents with  . Code Sepsis    Brief Narrative:   [name], HTN, iron deficiency anemia, [name], [name], chronic diastolic CHF, aortic stenosis, [name], [name], [name], [name], apparently has had diarrhea for the last several weeks, upon arrival to the ER he was found to have possible COVID-19 pneumonia versus aspiration pneumonia along with dehydration and metabolic encephalopathy and he was admitted to the hospital.  He is fully vaccinated for COVID-19.  Assessment & Plan:   Active Problems:   PVD (peripheral vascular disease) (HCC)   Hypertension   HLD (hyperlipidemia)   Leukocytosis   S/P AKA (above knee amputation) (HCC)   Diabetes mellitus type 2 in nonobese (HCC)   Dyslipidemia   Type 2 diabetes mellitus with peripheral neuropathy (HCC)   Pneumonia due to COVID-19 virus   Acute metabolic encephalopathy   Dysphagia  Acute Hypoxic Respiratory Failure  Aspiration Pneumonia  Chronic Right Pleural Effusion  COVID 19 infection Vaccinated CXR 9/22 with loculated R effusion CT 9/17 with enlarging chronic R effusion and patchy R upper lobe airspace disease Currently on RA, had mild hypoxia earlier in hospitalization Per previous provider, COVID 19 infection was not thought to be primary driver of his illness.  S/p 5 days of remdesivir. Being treated for aspiration pneumonia with unasyn Pulm c/s for chronic pulm effusion, appreciate assistance - plan for pigtail, possibly lytics after drainage, and repeat CT Blood and urine cx NG.  MRSA PCR negative.  COVID-19 Labs  Recent Labs    08/13/20 0402 08/14/20 0859 08/15/20 1130  DDIMER 2.14* 3.09* 2.29*  CRP 5.9* 3.2* 2.1*    Lab Results  Component Value Date    SARSCOV2NAA POSITIVE (Dametri Ozburn) 08/09/2020   SARSCOV2NAA NEGATIVE 06/25/2020   SARSCOV2NAA NEGATIVE 06/18/2020   SARSCOV2NAA NEGATIVE 04/18/2020   PAD with R AKA, recent L. SMA stent with Chronic left heel arterial insufficiency ulcer present on admission.  Wound care on board, continue on dual antiplatelet therapy along with statin for secondary prevention, he follows with Dr. Randie Heinzain, wound care has been consulted, seen by vascular surgery for now wound actually now seems to be improving per vascular surgery, continue supportive care.  Case discussed with Dr. Randie Heinzain, follow-up with him post discharge in 1 to 2 weeks.   CT PE with possible small linea filling defect that could relate to previous PE, unclear significance, no acute PE, will continue to monitor  4. Acute Metabolic encephalopathy upon admission. Likely 2/2 aspiration pneumonia. Now improved.  6.  History of aortic stenosis at least moderate per recent echocardiogram.  Outpatient cardiology follow-up.  Aortic valve area is 1.34 cm and V-max is 3.27 m/s.  Continue beta-blocker.  Avoid sudden preload reduction.   7.  Essential hypertension.  On beta-blocker, dose adjusted as he was slightly bradycardic (holding parameters), added Norvasc as well for better control.  8.  Dehydration due to diarrhea.  Diarrhea seems to have resolved. Improved after IV fluids.  9.  CKD 3A.  Baseline creatinine around 1.4.  Monitor.  10. Chronic iron deficiency anemia  Vitamin [name] Continue iron supplementation Start B12 supplementation based on August labs Repeat labs Relatively stable Hb  11.  Elevated D-dimer.  Likely due to infection and inflammation, negative  lower extremity ultrasound.  No clinical signs or symptoms of PE, increase Lovenox dose and monitor.  Most likely D-dimer high due to inflammation from aspiration pneumonia.  12.  Hypokalemia.  Replaced.    13. DM type II.  On sliding scale.  DVT prophylaxis: lovenox Code  Status: full Family Communication: wife Disposition:   Status is: Inpatient  Remains inpatient appropriate because:Inpatient level of care appropriate due to severity of illness   Dispo: The patient is from: Home              Anticipated d/c is to: SNF              Anticipated d/c date is: > 3 days              Patient currently is not medically stable to d/c.       Consultants:   Pulm  Procedures:  LE [place] Summary:  RIGHT:  - There is no evidence of deep vein thrombosis in the lower extremity.    LEFT:  - There is no evidence of deep vein thrombosis in the lower extremity.   Antimicrobials: Anti-infectives (From admission, onward)   Start     Dose/Rate Route Frequency Ordered Stop   08/11/20 1000  remdesivir 100 mg in sodium chloride 0.9 % 100 mL IVPB       "Followed by" Linked Group Details   100 mg 200 mL/hr over 30 Minutes Intravenous Daily 08/09/20 2256 08/14/20 0955   08/10/20 2000  azithromycin (ZITHROMAX) 500 mg in sodium chloride 0.9 % 250 mL IVPB  Status:  Discontinued        500 mg 250 mL/hr over 60 Minutes Intravenous Every 24 hours 08/09/20 2256 08/12/20 1042   08/10/20 1800  cefTRIAXone (ROCEPHIN) 2 g in sodium chloride 0.9 % 100 mL IVPB  Status:  Discontinued        2 g 200 mL/hr over 30 Minutes Intravenous Every 24 hours 08/09/20 2256 08/09/20 2332   08/10/20 0015  Ampicillin-Sulbactam (UNASYN) 3 g in sodium chloride 0.9 % 100 mL IVPB        3 g 200 mL/hr over 30 Minutes Intravenous Every 8 hours 08/10/20 0002 08/16/20 2359   08/10/20 0000  remdesivir 200 mg in sodium chloride 0.9% 250 mL IVPB       "Followed by" Linked Group Details   200 mg 580 mL/hr over 30 Minutes Intravenous Once 08/09/20 2256 08/10/20 0250   08/09/20 2030  cefTRIAXone (ROCEPHIN) 1 g in sodium chloride 0.9 % 100 mL IVPB        1 g 200 mL/hr over 30 Minutes Intravenous  Once 08/09/20 2020 08/09/20 2139   08/09/20 2030  azithromycin (ZITHROMAX) 500 mg in sodium chloride 0.9 %  250 mL IVPB        500 mg 250 mL/hr over 60 Minutes Intravenous  Once 08/09/20 2020 08/09/20 2140      Subjective: No new complaints  Objective: Vitals:   08/15/20 0319 08/15/20 0500 08/15/20 0752 08/15/20 1542  BP: (!) 141/51 (!) 157/77 (!) 143/76 139/78  Pulse: (!) 57 65 62 65  Resp: Temp: 98.5 F (36.9 C)  98.2 F (36.8 C) 98.1 F (36.7 C)  TempSrc: Oral  Oral Oral  SpO2: 100% 99% 99% 98%  Weight:      Height:        Intake/Output Summary (Last 24 hours) at 08/15/2020 1945 Last data filed at 08/15/2020 1427 Gross per 24 hour  Intake 333.34 ml  Output 1650 ml  Net -1316.66 ml   Filed Weights   08/10/20 2121  Weight: 61.6 kg    Examination:  General exam: Appears calm and comfortable  Respiratory system: Clear to auscultation. Respiratory effort normal. Cardiovascular system: S1 & S2 heard, RRR. Gastrointestinal system: Abdomen is nondistended, soft and nontender. Central nervous system: Alert and oriented. No focal neurological deficits. Extremities: R AKA Skin: No rashes, lesions or ulcers Psychiatry: Judgement and insight appear normal. Mood & affect appropriate.     Data Reviewed: I have personally reviewed following labs and imaging studies  CBC: Recent Labs  Lab 08/11/20 0214 08/12/20 1323 08/13/20 0402 08/14/20 0859 08/15/20 1130  WBC 16.4* 15.7* 11.0* 12.7* 11.3*  NEUTROABS 13.1* 14.2* 7.9* 9.6* 8.0*  HGB 7.6* 9.3* 9.4* 10.6* 8.3*  HCT 23.8* 28.3* 28.8* 32.1* 25.6*  MCV 83.8 84.5 83.5 83.2 82.8  PLT 206 270 258 293 219    Basic Metabolic Panel: Recent Labs  Lab 08/10/20 0320 08/10/20 1507 08/11/20 0214 08/12/20 1323 08/13/20 0402 08/14/20 0859 08/15/20 1130  NA 133*   < > 135 136 139 138 137  K 4.1   < > 4.0 3.9 3.7 3.3* 2.9*  CL 96*  --  100 102 104 103 104  CO2 25  --  25 24 26 24 25   GLUCOSE 173*  --  121* 237* 112* 155* 157*  BUN 15  --  29* 30* 40* 35* 29*  CREATININE 1.46*  --  1.45* 1.43* 1.46* 1.37* 1.42*    CALCIUM 8.5*  --  8.1* 8.2* 8.6* 8.9 8.0*  MG 1.7  --  1.9 1.8 2.0 1.9 1.9  PHOS 3.9  --   --   --   --   --   --    < > = values in this interval not displayed.    GFR: Estimated Creatinine Clearance: 38.6 mL/min (Jahzier Villalon) (by C-G formula based on SCr of 1.42 mg/dL (H)).  Liver Function Tests: Recent Labs  Lab 08/10/20 0320 08/11/20 0214 08/12/20 1323 08/13/20 0402 08/14/20 0859  AST 19 23 33 25 22  ALT 18 17 30 29  35  ALKPHOS 87 67 71 72 77  BILITOT 0.5 0.5 0.4 0.5 0.5  PROT 7.5 6.5 7.1 7.2 7.7  ALBUMIN 2.3* 1.9* 2.1* 2.2* 2.6*    CBG: Recent Labs  Lab 08/14/20 2348 08/15/20 0459 08/15/20 0813 08/15/20 1137 08/15/20 1635  GLUCAP 73 122* 81 141* 146*     Recent Results (from the past 240 hour(s))  Blood culture (routine single)     Status: None   Collection Time: 08/09/20  7:28 PM   Specimen: BLOOD  Result Value Ref Range Status   Specimen Description BLOOD RIGHT ANTECUBITAL  Final   Special Requests   Final    BOTTLES DRAWN AEROBIC ONLY Blood Culture results may not be optimal due to an inadequate volume of blood received in culture bottles   Culture   Final    NO GROWTH 5 DAYS Performed at [facility], [address] [place], [address] [place]    Report Status 08/14/2020 FINAL  Final  SARS Coronavirus 2 by RT PCR (hospital order, performed in [facility] hospital lab) Nasopharyngeal Nasopharyngeal Swab     Status: Abnormal   Collection Time: 08/09/20  7:56 PM   Specimen: Nasopharyngeal Swab  Result Value Ref Range Status   SARS Coronavirus 2 POSITIVE (Thai Burgueno) NEGATIVE Final    Comment: RESULT CALLED TO, READ  BACK BY AND VERIFIED WITH: B,OSORIO @2154  08/09/20 EB (NOTE) SARS-CoV-2 target nucleic acids are DETECTED  SARS-CoV-2 RNA is generally detectable in upper respiratory specimens  during the acute phase of infection.  Positive results are indicative  of the presence of the identified virus, but do not rule out bacterial infection or co-infection with  other pathogens not detected by the test.  Clinical correlation with patient history and  other diagnostic information is necessary to determine patient infection status.  The expected result is negative.  Fact Sheet for Patients:   08/11/20   Fact Sheet for Healthcare Providers:   BoilerBrush.com.cy    This test is not yet approved or cleared by the https://pope.com/ FDA and  has been authorized for detection and/or diagnosis of SARS-CoV-2 by FDA under an Emergency Use Authorization (EUA).  This EUA will remain in effect (meaning this test can be  used) for the duration of  the COVID-19 declaration under Section 564(b)(1) of the Act, 21 U.S.C. section 360-bbb-3(b)(1), unless the authorization is terminated or revoked sooner.  Performed at [facility], [address] [place], [place]   Urine culture     Status: None   Collection Time: 08/09/20 10:26 PM   Specimen: In/Out Cath Urine  Result Value Ref Range Status   Specimen Description IN/OUT CATH URINE  Final   Special Requests NONE  Final   Culture   Final    NO GROWTH Performed at [facility], [address], [place], [place]    Report Status 08/10/2020 FINAL  Final  Culture, blood (Routine X 2) w Reflex to ID Panel     Status: None   Collection Time: 08/10/20  3:18 AM   Specimen: BLOOD LEFT HAND  Result Value Ref Range Status   Specimen Description BLOOD LEFT HAND  Final   Special Requests   Final    BOTTLES DRAWN AEROBIC AND ANAEROBIC Blood Culture adequate volume   Culture   Final    NO GROWTH 5 DAYS Performed at [facility], [address], [place], [place]    Report Status 08/15/2020 FINAL  Final  Culture, blood (Routine X 2) w Reflex to ID Panel     Status: None   Collection Time: 08/10/20  2:53 PM   Specimen: BLOOD  Result Value Ref Range Status   Specimen Description BLOOD SITE NOT SPECIFIED  Final    Special Requests   Final    BOTTLES DRAWN AEROBIC AND ANAEROBIC Blood Culture adequate volume   Culture   Final    NO GROWTH 5 DAYS Performed at [facility], [address], [place], [place]    Report Status 08/15/2020 FINAL  Final  MRSA PCR Screening     Status: None   Collection Time: 08/10/20 10:00 PM   Specimen: Nasal Mucosa; Nasopharyngeal  Result Value Ref Range Status   MRSA by PCR NEGATIVE NEGATIVE Final    Comment:        The GeneXpert MRSA Assay (FDA approved for NASAL specimens only), is one component of [facility] comprehensive MRSA colonization surveillance program. It is not intended to diagnose MRSA infection nor to guide or monitor treatment for MRSA infections. Performed at [facility], [address] [place], [place]          Radiology Studies: DG CHEST PORT 1 VIEW  Result Date: 08/15/2020 CLINICAL DATA:  Pleural effusion.  COVID positive. EXAM:  PORTABLE CHEST 1 VIEW COMPARISON:  CTA chest dated August 10, 2020. Chest x-ray dated August 09, 2020. FINDINGS: The heart size and mediastinal contours are within normal limits. Normal pulmonary vascularity. Unchanged partially loculated right pleural effusion with slightly improved aeration of the right middle and lower lobes. Left lung is clear. No pneumothorax. No acute osseous abnormality. Partially visualized small chondroid lesion in the right proximal humerus, likely an enchondroma. IMPRESSION: 1. Unchanged loculated right pleural effusion with slightly improved aeration of the right middle and lower lobes. Electronically Signed   By: Obie Dredge M.D.   On: 08/15/2020 16:30        Scheduled Meds: . amLODipine  10 mg Oral Daily  . vitamin C  500 mg Oral Daily  . aspirin  81 mg Oral Daily  . atorvastatin  20 mg Oral Daily  . brimonidine  1 drop Both Eyes BID  . clopidogrel  75 mg Oral Q breakfast  . enoxaparin (LOVENOX) injection  40 mg Subcutaneous Q12H  . feeding  supplement (ENSURE ENLIVE)  237 mL Oral TID BM  . ferrous sulfate  325 mg Oral BID WC  . hydrALAZINE  50 mg Oral Q8H  . insulin aspart  0-9 Units Subcutaneous Q4H  . metoprolol succinate  25 mg Oral Daily  . pantoprazole  40 mg Oral Daily  . sodium chloride flush  3 mL Intravenous Q12H  . timolol  1 drop Both Eyes BID  . zinc sulfate  220 mg Oral Daily   Continuous Infusions: . ampicillin-sulbactam (UNASYN) IV 3 g (08/15/20 1721)     LOS: 6 days    Time spent: over 30 min    Lacretia Nicks, MD [facility]   To contact the attending provider between 7A-7P or the covering provider during after hours 7P-7A, please log into the web site [email or web address] and access using universal [place] password for that web site. If you do not have the password, please call the hospital operator.  08/15/2020, 7:45 PM

## 2020-08-15 NOTE — Consult Note (Addendum)
NAME:  Jeremiah [name], MRN:  [id number], DOB:  Apr 10, 1946, LOS: 6 ADMISSION DATE:  08/09/2020, CONSULTATION DATE:  9/22 REFERRING MD:  singh, CHIEF COMPLAINT:  Pleural effusion      History of present illness   This is a 74 year old male patient with history as mentioned below.  Admitted to the hospital on 9/16 with chief complaint of confusion, diarrhea, and dehydration.  Ultimately admitted with working diagnosis of what was felt to be a mix of both aspiration pneumonia, further complicated by incidental finding of positive Covid test of note he was fully vaccinated.  He was admitted to the internal medicine service therapeutic interventions have included IV antibiotics, IV hydration, and supportive care.  He was additionally started on IV steroids in remdesivir.  He did have an elevated D-dimer, had No clinical findings of pulmonary embolus but a CT of chest was obtained to further evaluate, this was negative for pulmonary embolus however it did show enlarging what appeared to be chronic right pleural effusion with pleural rind, and compressive atelectasis of the right middle and lower lobe.  During his hospitalization he has made slow but steady improvement shortness of breath is improved, hypoxia resolved, metabolic encephalopathy has resolved.  However because of his chronic right pleural effusion and concerned about this being related to recurrent aspiration pulmonary was asked to evaluate on 9/22   Past Medical History  Left SFA stent, chronic nonhealing left heel ulcer.  Being followed by vascular surgery HTN, iron def anemia, [name], B12, chronic diastolic HF, AS< DM right AKA Peripheral artery disease  Significant Hospital Events   9/16 admitted Consults:  Vascular surgery Pulmonary Procedures:    Significant Diagnostic Tests:  CT of chest:9/17 shows chronic dependent right pleural effusion with associated pleural thickening this is larger when comparing prior CT exam there  is associated atelectasis involving the right middle and lower lobe worrisome for pneumonia.  Cannot exclude potential malignancy  Micro Data:  9/17 blood cultures x2: Negative  Antimicrobials:  unasyn 9/16>>>  Interim history/subjective:  Feels better  Objective   Blood pressure (Abnormal) 143/76, pulse 62, temperature 98.2 F (36.8 C), temperature source Oral, resp. rate 17, height 5\' 9"  (1.753 m), weight 61.6 kg, SpO2 99 %.        Intake/Output Summary (Last 24 hours) at 08/15/2020 1545 Last data filed at 08/15/2020 1427 Gross per 24 hour  Intake 609.1 ml  Output 1650 ml  Net -1040.9 ml   Filed Weights   08/10/20 2121  Weight: 61.6 kg    Examination: General: 74 year old black male resting in bed no acute distress HENT: Normocephalic atraumatic Lungs: Resting comfortably in bed no accessory use currently room air diminished right base Cardiovascular: Regular rate and rhythm Abdomen: Soft nonten  Extremities: right AKA left lower extremity dressing intact Neuro: Awake oriented GU: Voids  Resolved Hospital Problem list   Aspiration pneumonia Acute metabolic encephalopathy  Assessment & Plan:  Loculated right pleural effusion Recent aspiration pneumonia Resolved acute metabolic encephalopathy Resolved acute respiratory failure History of aortic stenosis History of hypertension History of severe peripheral vascular disease in arterial ulceration involving the left foot CKD stage III Chronic iron deficiency anemia   Pulmonary problem list: Loculated right pleural effusion with pleural rind.  In setting of what also appears to be recent aspiration pneumonia, although postobstructive atelectasis and associated chronic pleural effusion in the setting of malignancy also a consideration.  Plan Have organized with SWOT nurse Will place right thoracostomy tube to  facilitate complete drainage of right pleural effusion, send fluid for evaluation He may require TPA and  DNase for complete evacuation of chest Cont unasyn   Best practice:  Per primary  Labs   CBC: Recent Labs  Lab 08/11/20 0214 08/12/20 1323 08/13/20 0402 08/14/20 0859 08/15/20 1130  WBC 16.4* 15.7* 11.0* 12.7* 11.3*  NEUTROABS 13.1* 14.2* 7.9* 9.6* 8.0*  HGB 7.6* 9.3* 9.4* 10.6* 8.3*  HCT 23.8* 28.3* 28.8* 32.1* 25.6*  MCV 83.8 84.5 83.5 83.2 82.8  PLT 206 270 258 293 219    Basic Metabolic Panel: Recent Labs  Lab 08/10/20 0320 08/10/20 1507 08/11/20 0214 08/12/20 1323 08/13/20 0402 08/14/20 0859 08/15/20 1130  NA 133*   < > 135 136 139 138 137  K 4.1   < > 4.0 3.9 3.7 3.3* 2.9*  CL 96*  --  100 102 104 103 104  CO2 25  --  25 24 26 24 25   GLUCOSE 173*  --  121* 237* 112* 155* 157*  BUN 15  --  29* 30* 40* 35* 29*  CREATININE 1.46*  --  1.45* 1.43* 1.46* 1.37* 1.42*  CALCIUM 8.5*  --  8.1* 8.2* 8.6* 8.9 8.0*  MG 1.7  --  1.9 1.8 2.0 1.9 1.9  PHOS 3.9  --   --   --   --   --   --    < > = values in this interval not displayed.   GFR: Estimated Creatinine Clearance: 38.6 mL/min (A) (by C-G formula based on SCr of 1.42 mg/dL (H)). Recent Labs  Lab 08/09/20 1944 08/09/20 2101 08/09/20 2156 08/11/20 0214 08/11/20 0214 08/12/20 1323 08/13/20 0402 08/14/20 0859 08/15/20 1130  PROCALCITON  --   --    < > 1.34  --  0.58 0.44 0.15  --   WBC 18.2*  --    < > 16.4*   < > 15.7* 11.0* 12.7* 11.3*  LATICACIDVEN 1.3 0.9  --   --   --   --   --   --   --    < > = values in this interval not displayed.    Liver Function Tests: Recent Labs  Lab 08/10/20 0320 08/11/20 0214 08/12/20 1323 08/13/20 0402 08/14/20 0859  AST 19 23 33 25 22  ALT 18 17 30 29  35  ALKPHOS 87 67 71 72 77  BILITOT 0.5 0.5 0.4 0.5 0.5  PROT 7.5 6.5 7.1 7.2 7.7  ALBUMIN 2.3* 1.9* 2.1* 2.2* 2.6*   No results for input(s): LIPASE, AMYLASE in the last 168 hours. No results for input(s): AMMONIA in the last 168 hours.  ABG    Component Value Date/Time   HCO3 26.1 08/10/2020 1507    TCO2 27 08/10/2020 1507   O2SAT 99.0 08/10/2020 1507     Coagulation Profile: Recent Labs  Lab 08/09/20 1944  INR 1.2    Cardiac Enzymes: No results for input(s): CKTOTAL, CKMB, CKMBINDEX, TROPONINI in the last 168 hours.  HbA1C: Hgb A1c MFr Bld  Date/Time Value Ref Range Status  08/10/2020 12:33 AM 6.0 (H) 4.8 - 5.6 % Final    Comment:    (NOTE) Pre diabetes:          5.7%-6.4%  Diabetes:              >6.4%  Glycemic control for   <7.0% adults with diabetes   06/18/2020 03:59 PM 6.1 (H) 4.8 - 5.6 % Final    Comment:    (  NOTE) Pre diabetes:          5.7%-6.4%  Diabetes:              >6.4%  Glycemic control for   <7.0% adults with diabetes     CBG: Recent Labs  Lab 08/14/20 2015 08/14/20 2348 08/15/20 0459 08/15/20 0813 08/15/20 1137  GLUCAP 122* 73 122* 81 141*    Review of Systems:   Review of Systems  Constitutional: Positive for weight loss.  Eyes: Negative.   Respiratory: Positive for cough.   Cardiovascular: Negative.   Gastrointestinal: Negative.   Genitourinary: Negative.   Musculoskeletal: Negative.   Skin: Negative.      Past Medical History  He,  has a past medical history of Amputee, Aortic atherosclerosis (HCC), Arthritis, Atherosclerosis of coronary artery, C. difficile diarrhea (04/2018), Diastolic CHF (HCC), Diverticulosis, High cholesterol, History of gout, Hypertension, IDA (iron deficiency anemia), Internal hemorrhoids, Peripheral vascular disease (HCC), Pneumonia, Sleep apnea, and Type II diabetes mellitus (HCC).   Surgical History    Past Surgical History:  Procedure Laterality Date   ABDOMINAL AORTOGRAM W/LOWER EXTREMITY N/A 11/01/2018   Procedure: ABDOMINAL AORTOGRAM W/LOWER EXTREMITY;  Surgeon: Runell Gess, MD;  Location: [facility];  Service: Cardiovascular;  Laterality: N/A;   ABDOMINAL AORTOGRAM W/LOWER EXTREMITY Left 06/20/2020   Procedure: ABDOMINAL AORTOGRAM W/LOWER EXTREMITY;  Surgeon: Cephus Shelling, MD;  Location: [facility];  Service: Cardiovascular;  Laterality: Left;   AMPUTATION Right 11/09/2018   Procedure: RIGHT - AMPUTATION ABOVE KNEE;  Surgeon: Maeola Harman, MD;  Location: [facility] OR;  Service: Vascular;  Laterality: Right;   CATARACT EXTRACTION W/ INTRAOCULAR LENS  IMPLANT, BILATERAL Bilateral    CHOLECYSTECTOMY  05/21/2018   ATTEMPTED LAPAROSCOPIC CHOLECYSTECTOMY, OPEN DRAINAGE OF GALLBLADDER WITH BIOPSY   CHOLECYSTECTOMY N/A 05/21/2018   Procedure: ATTEMPTED LAPAROSCOPIC CHOLECYSTECTOMY, OPEN DRAINAGE OF GALLBLADDER WITH BIOPSY;  Surgeon: Griselda Miner, MD;  Location: [facility] OR;  Service: General;  Laterality: N/A;   COLONOSCOPY W/ POLYPECTOMY     COLONOSCOPY WITH PROPOFOL N/A 09/16/2018   Procedure: COLONOSCOPY WITH PROPOFOL;  Surgeon: Charlott Rakes, MD;  Location: [facility];  Service: Endoscopy;  Laterality: N/A;   FECAL TRANSPLANT N/A 09/16/2018   Procedure: FECAL TRANSPLANT;  Surgeon: Charlott Rakes, MD;  Location: [facility];  Service: Endoscopy;  Laterality: N/A;   FLEXIBLE SIGMOIDOSCOPY N/A 04/20/2020   Procedure: FLEXIBLE SIGMOIDOSCOPY;  Surgeon: Meridee Score Netty Starring., MD;  Location: [facility];  Service: Gastroenterology;  Laterality: N/A;  Fecal Disimpaction   IR CATHETER TUBE CHANGE  04/14/2018   IR CHOLANGIOGRAM EXISTING TUBE  03/17/2018   IR PERC CHOLECYSTOSTOMY  01/31/2018   IR RADIOLOGIST EVAL & MGMT  03/02/2018   PERIPHERAL VASCULAR INTERVENTION Left 06/20/2020   Procedure: PERIPHERAL VASCULAR INTERVENTION;  Surgeon: Cephus Shelling, MD;  Location: [facility];  Service: Cardiovascular;  Laterality: Left;  4 SFA STENTS     Social History   reports that he quit smoking about 36 years ago. His smoking use included cigarettes. He quit after 24.00 years of use. He has never used smokeless tobacco. He reports previous alcohol use. He reports that he does not use drugs.   Family History   His family  history includes Diabetes in his brother, mother, and sister; Heart attack in his brother and father; Heart disease in his brother and father; Hypertension in his brother, mother, and sister.   Allergies No Known Allergies   Home Medications  Prior to Admission  medications   Medication Sig Start Date End Date Taking? Authorizing Provider  acetaminophen (TYLENOL) 500 MG tablet Take 1,000 mg by mouth every 6 (six) hours as needed for moderate pain or headache.   Yes [provider]  amLODipine (NORVASC) 10 MG tablet Take 10 mg by mouth daily. 07/26/20  Yes [provider]  aspirin 81 MG tablet Take 81 mg by mouth daily.   Yes [provider]  atorvastatin (LIPITOR) 20 MG tablet Take 1 tablet (20 mg total) by mouth daily. 12/06/18  Yes Angiulli, Mcarthur Rossetti, PA-C  brimonidine (ALPHAGAN) 0.2 % ophthalmic solution Place 1 drop into both eyes 2 (two) times daily. 12/06/18  Yes Angiulli, Mcarthur Rossetti, PA-C  clopidogrel (PLAVIX) 75 MG tablet Take 1 tablet (75 mg total) by mouth daily with breakfast. 06/22/20  Yes Rhetta Mura, MD  ferrous sulfate 325 (65 FE) MG tablet Take 1 tablet (325 mg total) by mouth 2 (two) times daily with a meal. 06/28/20  Yes Zannie Cove, MD  gabapentin (NEURONTIN) 300 MG capsule Take 1 capsule (300 mg total) by mouth 2 (two) times daily. 06/28/20  Yes Zannie Cove, MD  glimepiride (AMARYL) 1 MG tablet Take 1 tablet (1 mg total) by mouth daily with breakfast. 12/06/18  Yes Angiulli, Mcarthur Rossetti, PA-C  hydrALAZINE (APRESOLINE) 100 MG tablet Take 0.5 tablets (50 mg total) by mouth every 8 (eight) hours. Patient taking differently: Take 50 mg by mouth 2 (two) times daily.  06/28/20  Yes Zannie Cove, MD  metoprolol succinate (TOPROL-XL) 50 MG 24 hr tablet Take 50 mg by mouth daily.  03/18/19  Yes [provider]  timolol (TIMOPTIC) 0.5 % ophthalmic solution Place 1 drop into both eyes 2 (two) times daily. 12/06/18  Yes Angiulli, Mcarthur Rossetti, PA-C    traMADol (ULTRAM) 50 MG tablet Take 50 mg by mouth every 8 (eight) hours as needed for moderate pain.    Yes [provider]  glucose blood test strip Accu-Chek Aviva Plus test strips  Take 1 strip 3 times a day by miscell. route for 90 days.    [provider]  glucose blood test strip Accu-Chek Aviva Plus test strips  USE AS DIRECTED THREE TIMES DAILY.    [provider]     Critical care time: NA    Simonne Martinet ACNP-BC [facility] Pulmonary/Critical Care Pager # [phone] OR # [phone] if no answer

## 2020-08-15 NOTE — TOC Progression Note (Signed)
Transition of Care [facility]) - Progression Note    Patient Details  Name: TOR TSUDA MRN: [id number] Date of Birth: Jan 06, 1946  [facility] CM/SW Contact  Mearl Latin, LCSW Phone Number: 08/15/2020, 3:25 PM  Clinical Narrative:    Insurance approval received for [place], effective 9/22-9/24: Ref# [id number]/ Auth# [id number]. [place] and patient's son aware patient not discharging today.   Expected Discharge Plan: Home w Home Health Services Barriers to Discharge: Continued Medical Work up  Expected Discharge Plan and Services Expected Discharge Plan: Home w Home Health Services In-house Referral: Clinical Social Work     Living arrangements for the past 2 months: Single Family Home                                       Social Determinants of Health (SDOH) Interventions    Readmission Risk Interventions Readmission Risk Prevention Plan 08/13/2020 07/08/2018 07/08/2018  Transportation Screening Complete - Complete  Medication Review [facility]) Referral to Pharmacy - Complete  PCP or Specialist appointment within 3-5 days of discharge Complete - Not Complete  HRI or Home Care Consult Complete (No Data) Complete  SW Recovery Care/Counseling Consult Complete Patient refused Not Complete  Palliative Care Screening Not Applicable - Not Complete  Comments - - (No Data)  Medication Reconcilation (Pharmacy) - - Not Complete  Skilled Nursing Facility Complete - Patient refused  Some recent data might be hidden

## 2020-08-15 NOTE — Progress Notes (Signed)
Nutrition Follow-up  DOCUMENTATION CODES:   Not applicable  INTERVENTION:   -Continue liberalized diet of regular -Increase Ensure Enlive po to TID, each supplement provides 350 kcal and 20 grams of protein -Continue MVI with minerals daily -D/c 1 packet Juven BID, each packet provides 95 calories, 2.5 grams of protein (collagen), and 9.8 grams of carbohydrate (3 grams sugar); also contains 7 grams of L-arginine and L-glutamine, 300 mg vitamin C, 15 mg vitamin E, 1.2 mcg vitamin B-12, 9.5 mg zinc, 200 mg calcium, and 1.5 g  Calcium Beta-hydroxy-Beta-methylbutyrate to support wound healing  NUTRITION DIAGNOSIS:   Increased nutrient needs related to acute illness, chronic illness, wound healing (COVID-19 virus infection, chronic dCHF, stage II sacurm, chronic L heel diabetic ulcer) as evidenced by estimated needs.  Ongoing  GOAL:   Patient will meet greater than or equal to 90% of their needs  Progressing   MONITOR:   PO intake, Diet advancement, Labs, Skin, Weight trends, Supplement acceptance  REASON FOR ASSESSMENT:   Malnutrition Screening Tool    ASSESSMENT:   74 year old male with history of PVD s/p stent, iron deficiency anemia, [name], [name], chronic dCHF, aortic stenosis, DM2, [name] with CPAP, s/p R AKA in 2019, chronic left heel ulcer, presented with 1 week history of AMS, diarrhea and cough admitted with aspiration pneumonia and found to be COVID-19 positive s/p being fully vaccinated.  9/17- s/p BSE- advanced to regular diet with thin liquids  Reviewed I/O's: -496 ml x 24 hours and -1.3 L since admission  UOP: 1.4 L x 24 hours  Attempted to speak with pt via call to hospital room phone, however, no answer.   Pt remains with good appetite; noted meal completion 50-100%. Pt is consuming Juven and Ensure Enlive supplements. Diet was liberalized to regular during previous visit on 08/11/20; agree with this due to increased nutritional needs  related to COVID.   Pt with increased nutritional needs and would benefit from nutrient dense supplement. One Ensure Enlive supplement provides 350 kcals, 20 grams protein, and 44-45 grams of carbohydrate vs one Glucerna shake supplement, which provides 220 kcals, 10 grams of protein, and 26 grams of carbohydrate. Given pt's hx of DM, RD will reassess adequacy of PO intake, CBGS, and adjust supplement regimen as appropriate at follow-up.   Medications reviewed and include vitamin C, lovenox, ferrous sulfate, and zinc sulfate.   Per TOC notes, pt and family are now agreeable to SNF placement once medically stable.   Lab Results  Component Value Date   HGBA1C 6.0 (H) 08/10/2020   PTA DM medications are .   Labs reviewed: CBGS: 73-141 (inpatient orders for glycemic control are 0-9 units insulin aspart every 4 hours).   Diet Order:   Diet Order            Diet regular Room service appropriate? Yes; Fluid consistency: Thin  Diet effective now                 EDUCATION NEEDS:   No education needs have been identified at this time  Skin:  Skin Assessment: Skin Integrity Issues: Skin Integrity Issues:: Diabetic Ulcer, Stage II Stage II: sacrum Diabetic Ulcer: L;heel  Last BM:  08/13/20  Height:   Ht Readings from Last 1 Encounters:  08/10/20 5\' 9"  (1.753 m)    Weight:   Wt Readings from Last 1 Encounters:  08/10/20 61.6 kg    Ideal Body Weight:  68 kg (adjusted for rt AKA)  BMI:  Body mass index is 20.05 kg/m.  Estimated Nutritional Needs:   Kcal:  1900-2160  Protein:  95-108  Fluid:  > 1.5 L    Levada Schilling, RD, LDN, CDCES Registered Dietitian II Certified Diabetes Care and Education Specialist Please refer to [facility] for RD and/or RD on-call/weekend/after hours pager

## 2020-08-16 ENCOUNTER — Inpatient Hospital Stay (HOSPITAL_COMMUNITY): Payer: Medicare Other

## 2020-08-16 DIAGNOSIS — J90 Pleural effusion, not elsewhere classified: Secondary | ICD-10-CM

## 2020-08-16 LAB — BODY FLUID CELL COUNT WITH DIFFERENTIAL
Eos, Fluid: 0 %
Lymphs, Fluid: 79 %
Monocyte-Macrophage-Serous Fluid: 14 % — ABNORMAL LOW (ref 50–90)
Neutrophil Count, Fluid: 7 % (ref 0–25)
Total Nucleated Cell Count, Fluid: 59 cu mm (ref 0–1000)

## 2020-08-16 LAB — LACTATE DEHYDROGENASE: LDH: 134 U/L (ref 98–192)

## 2020-08-16 LAB — LACTATE DEHYDROGENASE, PLEURAL OR PERITONEAL FLUID: LD, Fluid: 128 U/L — ABNORMAL HIGH (ref 3–23)

## 2020-08-16 LAB — BASIC METABOLIC PANEL
Anion gap: 9 (ref 5–15)
BUN: 24 mg/dL — ABNORMAL HIGH (ref 8–23)
CO2: 24 mmol/L (ref 22–32)
Calcium: 8.4 mg/dL — ABNORMAL LOW (ref 8.9–10.3)
Chloride: 106 mmol/L (ref 98–111)
Creatinine, Ser: 1.28 mg/dL — ABNORMAL HIGH (ref 0.61–1.24)
GFR calc Af Amer: >60 mL/min (ref >60)
GFR calc non Af Amer: 54 mL/min — ABNORMAL LOW (ref >60)
Glucose, Bld: 117 mg/dL — ABNORMAL HIGH (ref 70–99)
Potassium: 3.3 mmol/L — ABNORMAL LOW (ref 3.5–5.1)
Sodium: 139 mmol/L (ref 135–145)

## 2020-08-16 LAB — MAGNESIUM: Magnesium: 1.8 mg/dL (ref 1.7–2.4)

## 2020-08-16 LAB — CBC WITH DIFFERENTIAL/PLATELET
Abs Immature Granulocytes: 0.07 10*3/uL (ref 0.00–0.07)
Basophils Absolute: 0 10*3/uL (ref 0.0–0.1)
Basophils Relative: 0 %
Eosinophils Absolute: 0.5 10*3/uL (ref 0.0–0.5)
Eosinophils Relative: 4 %
HCT: 27 % — ABNORMAL LOW (ref 39.0–52.0)
Hemoglobin: 8.7 g/dL — ABNORMAL LOW (ref 13.0–17.0)
Immature Granulocytes: 1 %
Lymphocytes Relative: 17 %
Lymphs Abs: 2.3 10*3/uL (ref 0.7–4.0)
MCH: 26.4 pg (ref 26.0–34.0)
MCHC: 32.2 g/dL (ref 30.0–36.0)
MCV: 82.1 fL (ref 80.0–100.0)
Monocytes Absolute: 1.2 10*3/uL — ABNORMAL HIGH (ref 0.1–1.0)
Monocytes Relative: 9 %
Neutro Abs: 9.3 10*3/uL — ABNORMAL HIGH (ref 1.7–7.7)
Neutrophils Relative %: 69 %
Platelets: 236 10*3/uL (ref 150–400)
RBC: 3.29 MIL/uL — ABNORMAL LOW (ref 4.22–5.81)
RDW: 14.1 % (ref 11.5–15.5)
WBC: 13.3 10*3/uL — ABNORMAL HIGH (ref 4.0–10.5)
nRBC: 0 % (ref 0.0–0.2)

## 2020-08-16 LAB — GLUCOSE, CAPILLARY
Glucose-Capillary: 64 mg/dL — ABNORMAL LOW (ref 70–99)
Glucose-Capillary: 126 mg/dL — ABNORMAL HIGH (ref 70–99)
Glucose-Capillary: 110 mg/dL — ABNORMAL HIGH (ref 70–99)
Glucose-Capillary: 190 mg/dL — ABNORMAL HIGH (ref 70–99)
Glucose-Capillary: 219 mg/dL — ABNORMAL HIGH (ref 70–99)
Glucose-Capillary: 99 mg/dL (ref 70–99)

## 2020-08-16 LAB — FOLATE: Folate: 8.5 ng/mL (ref >5.9)

## 2020-08-16 LAB — C-REACTIVE PROTEIN: CRP: 1.9 mg/dL — ABNORMAL HIGH (ref <1.0)

## 2020-08-16 LAB — FERRITIN: Ferritin: 275 ng/mL (ref 24–336)

## 2020-08-16 LAB — IRON AND TIBC
Iron: 50 ug/dL (ref 45–182)
Saturation Ratios: 27 % (ref 17.9–39.5)
TIBC: 183 ug/dL — ABNORMAL LOW (ref 250–450)
UIBC: 133 ug/dL

## 2020-08-16 LAB — VITAMIN B12: Vitamin B-12: 323 pg/mL (ref 180–914)

## 2020-08-16 LAB — D-DIMER, QUANTITATIVE: D-Dimer, Quant: 2.3 ug/mL-FEU — ABNORMAL HIGH (ref 0.00–0.50)

## 2020-08-16 LAB — PROTEIN, PLEURAL OR PERITONEAL FLUID: Total protein, fluid: <3 g/dL

## 2020-08-16 MED ORDER — INSULIN ASPART 100 UNIT/ML ~~LOC~~ SOLN
0.0000 [IU] | Freq: Three times a day (TID) | SUBCUTANEOUS | Status: DC
  Administered 2020-08-16: 3 [IU] via SUBCUTANEOUS
  Administered 2020-08-16: 2 [IU] via SUBCUTANEOUS
  Administered 2020-08-17: 1 [IU] via SUBCUTANEOUS
  Administered 2020-08-17: 2 [IU] via SUBCUTANEOUS
  Administered 2020-08-17: 1 [IU] via SUBCUTANEOUS
  Administered 2020-08-18 (×2): 2 [IU] via SUBCUTANEOUS
  Administered 2020-08-19 (×2): 1 [IU] via SUBCUTANEOUS
  Administered 2020-08-19: 2 [IU] via SUBCUTANEOUS
  Administered 2020-08-20: 1 [IU] via SUBCUTANEOUS
  Administered 2020-08-20 (×2): 2 [IU] via SUBCUTANEOUS
  Administered 2020-08-21: 1 [IU] via SUBCUTANEOUS

## 2020-08-16 MED ORDER — POTASSIUM CHLORIDE CRYS ER 20 MEQ PO TBCR
40.0000 meq | EXTENDED_RELEASE_TABLET | Freq: Once | ORAL | Status: AC
  Administered 2020-08-16: 40 meq via ORAL
  Filled 2020-08-16: qty 2

## 2020-08-16 MED ORDER — SODIUM CHLORIDE 0.9% FLUSH
10.0000 mL | Freq: Three times a day (TID) | INTRAVENOUS | Status: DC
  Administered 2020-08-16 – 2020-08-19 (×9): 10 mL

## 2020-08-16 NOTE — Progress Notes (Signed)
PT Cancellation Note  Patient Details Name: Jeremiah [name] MRN: [id number] DOB: 01/30/1946   Cancelled Treatment:    Reason Eval/Treat Not Completed: Patient at procedure or test/unavailable (insertion of chest tube and subsequent chest x-ray).    Lillia Pauls, PT, DPT Acute Rehabilitation Services Pager [phone] Office [phone]    Norval Morton 08/16/2020, 2:21 PM

## 2020-08-16 NOTE — Progress Notes (Signed)
08/16/2020 Seen in f/u for pleural effusion.  74 year old man with history of PAD, Dm with AMS, dehydration, HTN, CKD, aoritc stenosis who presented on 9/16 with cough, confusion, SOB, fever found to be COVID positive.  Full vaccinated.  During hospitalization has improved with treatment for CAP but has a nonresolving right loculated effusion for which PCCM is consulted.  No events overnight.  Ready to have procedure done.  Denies pain.  On exam, crackles/rhonci at right base. Thin man in NAD. No accessory muscle use. Ext no edema. [place]: small R effusion  Lab review: K low, WBC slightly elevated CT: loculated R effusion vs. Empyema  A: COVID infection Aspiration pneumonia vs. CAP Chronic enlarging R effusion since at least May 2019, question chronic + infected on top Question of endobronchial obstruction vs. Related to mass effect of R effusion  P: - Pigtail today, place to -20cm H2O - Send fluid for usual studies - Check AM CXR, may need pleural fibrinolytics - Hold plavix in case needs bronch/biopsy - Will follow with you  [name] MD PCCM

## 2020-08-16 NOTE — Progress Notes (Signed)
Pt 4 am CBG 64.  Pt drank 2 orange juice = 240 mL.  Will continue to monitor.

## 2020-08-16 NOTE — Procedures (Signed)
Insertion of Chest Tube Procedure Note  Jeremiah [name]  [id number]  Jan 02, 1946  Date:08/16/20  Time:2:10 PM    Provider Performing: Lorin Glass   Procedure: Pleural Catheter Insertion w/ Imaging Guidance (19417)  Indication(s) Effusion  Consent Risks of the procedure as well as the alternatives and risks of each were explained to the patient and/or caregiver.  Consent for the procedure was obtained and is signed in the bedside chart  Anesthesia Topical only with 1% lidocaine    Time Out Verified patient identification, verified procedure, site/side was marked, verified correct patient position, special equipment/implants available, medications/allergies/relevant history reviewed, required imaging and test results available.   Sterile Technique Maximal sterile technique including full sterile barrier drape, hand hygiene, sterile gown, sterile gloves, mask, hair covering, sterile ultrasound probe cover (if used).   Procedure Description Ultrasound used to identify appropriate pleural anatomy for placement and overlying skin marked. Area of placement cleaned and draped in sterile fashion.  A 14 French pigtail pleural catheter was placed into the right pleural space using Seldinger technique. Appropriate return of fluid was obtained.  The tube was connected to atrium and placed on -20 cm H2O wall suction.   Complications/Tolerance None; patient tolerated the procedure well. Chest X-ray is ordered to verify placement.   EBL Minimal  Specimen(s) fluid

## 2020-08-16 NOTE — Progress Notes (Addendum)
PROGRESS NOTE    Jeremiah [name]  ZOX:[id number] DOB: 12-02-1945 DOA: 08/09/2020 PCP: Annita BrodAsenso, Philip, MD   Chief Complaint  Patient presents with  . Code Sepsis    Brief Narrative:   [name], HTN, iron deficiency anemia, [name], [name], chronic diastolic CHF, aortic stenosis, [name], [name], [name], [name], apparently has had diarrhea for the last several weeks, upon arrival to the ER he was found to have possible COVID-19 pneumonia versus aspiration pneumonia along with dehydration and metabolic encephalopathy and he was admitted to the hospital.  He is fully vaccinated for COVID-19.  Assessment & Plan:   Active Problems:   PVD (peripheral vascular disease) (HCC)   Hypertension   HLD (hyperlipidemia)   Leukocytosis   S/P AKA (above knee amputation) (HCC)   Diabetes mellitus type 2 in nonobese (HCC)   Dyslipidemia   Type 2 diabetes mellitus with peripheral neuropathy (HCC)   Pneumonia due to COVID-19 virus   Acute metabolic encephalopathy   Dysphagia  Acute Hypoxic Respiratory Failure  Aspiration Pneumonia  Chronic Right Pleural Effusion  COVID 19 infection Vaccinated CXR 9/22 with loculated R effusion CT 9/17 with enlarging chronic R effusion and patchy R upper lobe airspace disease Currently on RA, had mild hypoxia earlier in hospitalization Per previous provider, COVID 19 infection was not thought to be primary driver of his illness.  S/p 5 days of remdesivir. Being treated for aspiration pneumonia with unasyn Pulm c/s for chronic pulm effusion, appreciate assistance Blood and urine cx NG.  MRSA PCR negative. S/p pigtail 9/23, follow thora labs - cytology and culture pending   COVID-19 Labs  Recent Labs    08/14/20 0859 08/15/20 1130 08/16/20 0908  DDIMER 3.09* 2.29* 2.30*  FERRITIN  --   --  275  CRP 3.2* 2.1* 1.9*    Lab Results   Component Value Date   SARSCOV2NAA POSITIVE (Li Fragoso) 08/09/2020   SARSCOV2NAA NEGATIVE 06/25/2020   SARSCOV2NAA NEGATIVE 06/18/2020   SARSCOV2NAA NEGATIVE 04/18/2020   PAD with R AKA, recent L. SMA stent with Chronic left heel arterial insufficiency ulcer present on admission.  Wound care on board, continue on dual antiplatelet therapy along with statin for secondary prevention, he follows with Dr. Randie Heinzain, wound care has been consulted, seen by vascular surgery for now wound actually now seems to be improving per vascular surgery, continue supportive care.  Case discussed with Dr. Randie Heinzain, follow-up with him post discharge in 1 to 2 weeks.   CT PE with possible small linea filling defect that could relate to previous PE, unclear significance, no acute PE, will continue to monitor  4. Acute Metabolic encephalopathy upon admission. Likely 2/2 aspiration pneumonia. Now improved.  6.  History of aortic stenosis at least moderate per recent echocardiogram.  Outpatient cardiology follow-up.  Aortic valve area is 1.34 cm and V-max is 3.27 m/s.  Continue beta-blocker.  Avoid sudden preload reduction.   7.  Essential hypertension.  On beta-blocker, dose adjusted as he was slightly bradycardic (holding parameters), added Norvasc as well for better control.  8.  Dehydration due to diarrhea.  Diarrhea seems to have resolved. Improved after IV fluids.  9.  CKD 3A.  Baseline creatinine around 1.4.  Monitor.  10. Chronic iron deficiency anemia  Vitamin [name] Continue iron supplementation Start B12 supplementation based on August labs Repeat labs - normal iron, b12 folate Relatively  stable Hb  11.  Elevated D-dimer.  Likely due to infection and inflammation, negative lower extremity ultrasound.  No clinical signs or symptoms of PE.  Most likely D-dimer high due to inflammation from aspiration pneumonia.  12.  Hypokalemia.  Replaced.    13. DM type II.  On sliding scale.  DVT prophylaxis:  lovenox Code Status: full Family Communication: wife 9/23 Disposition:   Status is: Inpatient  Remains inpatient appropriate because:Inpatient level of care appropriate due to severity of illness   Dispo: The patient is from: Home              Anticipated d/c is to: SNF              Anticipated d/c date is: > 3 days              Patient currently is not medically stable to d/c.       Consultants:   Pulm  Procedures:  LE [place] Summary:  RIGHT:  - There is no evidence of deep vein thrombosis in the lower extremity.    LEFT:  - There is no evidence of deep vein thrombosis in the lower extremity.   Antimicrobials: Anti-infectives (From admission, onward)   Start     Dose/Rate Route Frequency Ordered Stop   08/11/20 1000  remdesivir 100 mg in sodium chloride 0.9 % 100 mL IVPB       "Followed by" Linked Group Details   100 mg 200 mL/hr over 30 Minutes Intravenous Daily 08/09/20 2256 08/14/20 0955   08/10/20 2000  azithromycin (ZITHROMAX) 500 mg in sodium chloride 0.9 % 250 mL IVPB  Status:  Discontinued        500 mg 250 mL/hr over 60 Minutes Intravenous Every 24 hours 08/09/20 2256 08/12/20 1042   08/10/20 1800  cefTRIAXone (ROCEPHIN) 2 g in sodium chloride 0.9 % 100 mL IVPB  Status:  Discontinued        2 g 200 mL/hr over 30 Minutes Intravenous Every 24 hours 08/09/20 2256 08/09/20 2332   08/10/20 0015  Ampicillin-Sulbactam (UNASYN) 3 g in sodium chloride 0.9 % 100 mL IVPB        3 g 200 mL/hr over 30 Minutes Intravenous Every 8 hours 08/10/20 0002 08/16/20 2359   08/10/20 0000  remdesivir 200 mg in sodium chloride 0.9% 250 mL IVPB       "Followed by" Linked Group Details   200 mg 580 mL/hr over 30 Minutes Intravenous Once 08/09/20 2256 08/10/20 0250   08/09/20 2030  cefTRIAXone (ROCEPHIN) 1 g in sodium chloride 0.9 % 100 mL IVPB        1 g 200 mL/hr over 30 Minutes Intravenous  Once 08/09/20 2020 08/09/20 2139   08/09/20 2030  azithromycin (ZITHROMAX) 500 mg in  sodium chloride 0.9 % 250 mL IVPB        500 mg 250 mL/hr over 60 Minutes Intravenous  Once 08/09/20 2020 08/09/20 2140      Subjective: No new complaints  Objective: Vitals:   08/16/20 1350 08/16/20 1351 08/16/20 1355 08/16/20 1400  BP: (!) 190/60 (!) 201/59 (!) 176/59 (!) 164/51  Pulse: 62 63 61 61  Resp: 11 10 (!) 0 16  Temp:      TempSrc:      SpO2: 99% 98% 96% 98%  Weight:      Height:        Intake/Output Summary (Last 24 hours) at 08/16/2020 1533 Last data filed at 08/16/2020 1150 Gross  per 24 hour  Intake 700.24 ml  Output 950 ml  Net -249.76 ml   Filed Weights   08/10/20 2121 08/16/20 0455  Weight: 61.6 kg 63.7 kg    Examination:  General: No acute distress. Cardiovascular: Heart sounds show Taijah Macrae regular rate, and rhythm Lungs: Clear to auscultation bilaterally. Abdomen: Soft, nontender, nondistended Neurological: Alert and oriented 3. Moves all extremities 4. Cranial nerves II through XII grossly intact. Skin: Warm and dry. No rashes or lesions. Extremities: R AKA    Data Reviewed: I have personally reviewed following labs and imaging studies  CBC: Recent Labs  Lab 08/12/20 1323 08/13/20 0402 08/14/20 0859 08/15/20 1130 08/16/20 0908  WBC 15.7* 11.0* 12.7* 11.3* 13.3*  NEUTROABS 14.2* 7.9* 9.6* 8.0* 9.3*  HGB 9.3* 9.4* 10.6* 8.3* 8.7*  HCT 28.3* 28.8* 32.1* 25.6* 27.0*  MCV 84.5 83.5 83.2 82.8 82.1  PLT 270 258 293 219 236    Basic Metabolic Panel: Recent Labs  Lab 08/10/20 0320 08/10/20 1507 08/12/20 1323 08/13/20 0402 08/14/20 0859 08/15/20 1130 08/16/20 0908  NA 133*   < > 136 139 138 137 139  K 4.1   < > 3.9 3.7 3.3* 2.9* 3.3*  CL 96*   < > 102 104 103 104 106  CO2 25   < > 24 26 24 25 24   GLUCOSE 173*   < > 237* 112* 155* 157* 117*  BUN 15   < > 30* 40* 35* 29* 24*  CREATININE 1.46*   < > 1.43* 1.46* 1.37* 1.42* 1.28*  CALCIUM 8.5*   < > 8.2* 8.6* 8.9 8.0* 8.4*  MG 1.7   < > 1.8 2.0 1.9 1.9 1.8  PHOS 3.9  --   --   --   --    --   --    < > = values in this interval not displayed.    GFR: Estimated Creatinine Clearance: 44.2 mL/min (Tailer Volkert) (by C-G formula based on SCr of 1.28 mg/dL (H)).  Liver Function Tests: Recent Labs  Lab 08/10/20 0320 08/11/20 0214 08/12/20 1323 08/13/20 0402 08/14/20 0859  AST 19 23 33 25 22  ALT 18 17 30 29  35  ALKPHOS 87 67 71 72 77  BILITOT 0.5 0.5 0.4 0.5 0.5  PROT 7.5 6.5 7.1 7.2 7.7  ALBUMIN 2.3* 1.9* 2.1* 2.2* 2.6*    CBG: Recent Labs  Lab 08/16/20 0358 08/16/20 0442 08/16/20 0735 08/16/20 1146 08/16/20 1531  GLUCAP 64* 126* 110* 190* 219*     Recent Results (from the past 240 hour(s))  Blood culture (routine single)     Status: None   Collection Time: 08/09/20  7:28 PM   Specimen: BLOOD  Result Value Ref Range Status   Specimen Description BLOOD RIGHT ANTECUBITAL  Final   Special Requests   Final    BOTTLES DRAWN AEROBIC ONLY Blood Culture results may not be optimal due to an inadequate volume of blood received in culture bottles   Culture   Final    NO GROWTH 5 DAYS Performed at [facility], [address], [place], [address] [place]    Report Status 08/14/2020 FINAL  Final  SARS Coronavirus 2 by RT PCR (hospital order, performed in [facility] hospital lab) Nasopharyngeal Nasopharyngeal Swab     Status: Abnormal   Collection Time: 08/09/20  7:56 PM   Specimen: Nasopharyngeal Swab  Result Value Ref Range Status   SARS Coronavirus 2 POSITIVE (Tarrence Enck) NEGATIVE Final    Comment: RESULT  CALLED TO, READ BACK BY AND VERIFIED WITH: B,OSORIO  08/09/20 EB (NOTE) SARS-CoV-2 target nucleic acids are DETECTED  SARS-CoV-2 RNA is generally detectable in upper respiratory specimens  during the acute phase of infection.  Positive results are indicative  of the presence of the identified virus, but do not rule out bacterial infection or co-infection with other pathogens not detected by the test.  Clinical correlation with patient history and  other  diagnostic information is necessary to determine patient infection status.  The expected result is negative.  Fact Sheet for Patients:   BoilerBrush.com.cy   Fact Sheet for Healthcare Providers:   https://pope.com/    This test is not yet approved or cleared by the [place] FDA and  has been authorized for detection and/or diagnosis of SARS-CoV-2 by FDA under an Emergency Use Authorization (EUA).  This EUA will remain in effect (meaning this test can be  used) for the duration of  the COVID-19 declaration under Section 564(b)(1) of the Act, 21 U.S.C. section 360-bbb-3(b)(1), unless the authorization is terminated or revoked sooner.  Performed at [facility], [address] [place], [place] [address]   Urine culture     Status: None   Collection Time: 08/09/20 10:26 PM   Specimen: In/Out Cath Urine  Result Value Ref Range Status   Specimen Description IN/OUT CATH URINE  Final   Special Requests NONE  Final   Culture   Final    NO GROWTH Performed at [facility], [address], [place], [place] [address]    Report Status 08/10/2020 FINAL  Final  Culture, blood (Routine X 2) w Reflex to ID Panel     Status: None   Collection Time: 08/10/20  3:18 AM   Specimen: BLOOD LEFT HAND  Result Value Ref Range Status   Specimen Description BLOOD LEFT HAND  Final   Special Requests   Final    BOTTLES DRAWN AEROBIC AND ANAEROBIC Blood Culture adequate volume   Culture   Final    NO GROWTH 5 DAYS Performed at [facility], [address], [place], [place] [address]    Report Status 08/15/2020 FINAL  Final  Culture, blood (Routine X 2) w Reflex to ID Panel     Status: None   Collection Time: 08/10/20  2:53 PM   Specimen: BLOOD  Result Value Ref Range Status   Specimen Description BLOOD SITE NOT SPECIFIED  Final   Special Requests   Final    BOTTLES DRAWN AEROBIC AND ANAEROBIC Blood Culture adequate volume    Culture   Final    NO GROWTH 5 DAYS Performed at [facility], [address], [place], [place] [address]    Report Status 08/15/2020 FINAL  Final  MRSA PCR Screening     Status: None   Collection Time: 08/10/20 10:00 PM   Specimen: Nasal Mucosa; Nasopharyngeal  Result Value Ref Range Status   MRSA by PCR NEGATIVE NEGATIVE Final    Comment:        The GeneXpert MRSA Assay (FDA approved for NASAL specimens only), is one component of Anicia Leuthold comprehensive MRSA colonization surveillance program. It is not intended to diagnose MRSA infection nor to guide or monitor treatment for MRSA infections. Performed at [facility], [address] [place], [place] [address]          Radiology Studies: DG Chest 1 View  Result Date: 08/16/2020 CLINICAL DATA:  Chest tube in place.  EXAM: CHEST  1 VIEW COMPARISON:  Radiograph yesterday.  CT 08/10/2020 FINDINGS: Placement of right pigtail catheter with catheter tip in the mid lower lung zone. Slight decreased right pleural effusion with partially loculated fluid persisting inferior laterally as well as right basilar atelectasis. No visualized pneumothorax. Heart is upper normal size. Unchanged mediastinal contours. Aortic atherosclerosis. Left lung essentially clear. IMPRESSION: Placement of right pigtail catheter with slight decreased right pleural effusion. Residual partially loculated fluid in the inferior lateral right hemithorax. No visualized pneumothorax. Electronically Signed   By: Narda Rutherford M.D.   On: 08/16/2020 15:07   DG CHEST PORT 1 VIEW  Result Date: 08/15/2020 CLINICAL DATA:  Pleural effusion.  COVID positive. EXAM: PORTABLE CHEST 1 VIEW COMPARISON:  CTA chest dated August 10, 2020. Chest x-ray dated August 09, 2020. FINDINGS: The heart size and mediastinal contours are within normal limits. Normal pulmonary vascularity. Unchanged partially loculated right pleural effusion with slightly improved aeration of the right  middle and lower lobes. Left lung is clear. No pneumothorax. No acute osseous abnormality. Partially visualized small chondroid lesion in the right proximal humerus, likely an enchondroma. IMPRESSION: 1. Unchanged loculated right pleural effusion with slightly improved aeration of the right middle and lower lobes. Electronically Signed   By: Obie Dredge M.D.   On: 08/15/2020 16:30        Scheduled Meds: . amLODipine  10 mg Oral Daily  . vitamin C  500 mg Oral Daily  . aspirin  81 mg Oral Daily  . atorvastatin  20 mg Oral Daily  . brimonidine  1 drop Both Eyes BID  . enoxaparin (LOVENOX) injection  40 mg Subcutaneous Q24H  . feeding supplement (ENSURE ENLIVE)  237 mL Oral TID BM  . ferrous sulfate  325 mg Oral BID WC  . hydrALAZINE  50 mg Oral Q8H  . insulin aspart  0-9 Units Subcutaneous TID WC  . metoprolol succinate  25 mg Oral Daily  . pantoprazole  40 mg Oral Daily  . sodium chloride flush  10 mL Intracatheter Q8H  . sodium chloride flush  3 mL Intravenous Q12H  . timolol  1 drop Both Eyes BID  . vitamin B-12  1,000 mcg Oral Daily  . zinc sulfate  220 mg Oral Daily   Continuous Infusions: . ampicillin-sulbactam (UNASYN) IV 3 g (08/16/20 0934)     LOS: 7 days    Time spent: over 30 min    Lacretia Nicks, MD [facility]   To contact the attending provider between 7A-7P or the covering provider during after hours 7P-7A, please log into the web site [email or web address] and access using universal [place] password for that web site. If you do not have the password, please call the hospital operator.  08/16/2020, 3:33 PM

## 2020-08-17 ENCOUNTER — Inpatient Hospital Stay (HOSPITAL_COMMUNITY): Payer: Medicare Other

## 2020-08-17 LAB — CBC WITH DIFFERENTIAL/PLATELET
Abs Immature Granulocytes: 0.05 K/uL (ref 0.00–0.07)
Basophils Absolute: 0 K/uL (ref 0.0–0.1)
Basophils Relative: 0 %
Eosinophils Absolute: 0.5 K/uL (ref 0.0–0.5)
Eosinophils Relative: 5 %
HCT: 26.7 % — ABNORMAL LOW (ref 39.0–52.0)
Hemoglobin: 8.6 g/dL — ABNORMAL LOW (ref 13.0–17.0)
Immature Granulocytes: 1 %
Lymphocytes Relative: 19 %
Lymphs Abs: 2 K/uL (ref 0.7–4.0)
MCH: 26.8 pg (ref 26.0–34.0)
MCHC: 32.2 g/dL (ref 30.0–36.0)
MCV: 83.2 fL (ref 80.0–100.0)
Monocytes Absolute: 1 K/uL (ref 0.1–1.0)
Monocytes Relative: 9 %
Neutro Abs: 7.2 K/uL (ref 1.7–7.7)
Neutrophils Relative %: 66 %
Platelets: 213 K/uL (ref 150–400)
RBC: 3.21 MIL/uL — ABNORMAL LOW (ref 4.22–5.81)
RDW: 14.1 % (ref 11.5–15.5)
WBC: 10.8 K/uL — ABNORMAL HIGH (ref 4.0–10.5)
nRBC: 0 % (ref 0.0–0.2)

## 2020-08-17 LAB — COMPREHENSIVE METABOLIC PANEL WITH GFR
ALT: 19 U/L (ref 0–44)
AST: 14 U/L — ABNORMAL LOW (ref 15–41)
Albumin: 2.2 g/dL — ABNORMAL LOW (ref 3.5–5.0)
Alkaline Phosphatase: 64 U/L (ref 38–126)
Anion gap: 5 (ref 5–15)
BUN: 19 mg/dL (ref 8–23)
CO2: 25 mmol/L (ref 22–32)
Calcium: 8.1 mg/dL — ABNORMAL LOW (ref 8.9–10.3)
Chloride: 106 mmol/L (ref 98–111)
Creatinine, Ser: 1.26 mg/dL — ABNORMAL HIGH (ref 0.61–1.24)
GFR calc Af Amer: >60 mL/min
GFR calc non Af Amer: 55 mL/min — ABNORMAL LOW
Glucose, Bld: 104 mg/dL — ABNORMAL HIGH (ref 70–99)
Potassium: 3.8 mmol/L (ref 3.5–5.1)
Sodium: 136 mmol/L (ref 135–145)
Total Bilirubin: 0.4 mg/dL (ref 0.3–1.2)
Total Protein: 6 g/dL — ABNORMAL LOW (ref 6.5–8.1)

## 2020-08-17 LAB — PHOSPHORUS: Phosphorus: 2.3 mg/dL — ABNORMAL LOW (ref 2.5–4.6)

## 2020-08-17 LAB — GLUCOSE, CAPILLARY
Glucose-Capillary: 103 mg/dL — ABNORMAL HIGH (ref 70–99)
Glucose-Capillary: 149 mg/dL — ABNORMAL HIGH (ref 70–99)
Glucose-Capillary: 177 mg/dL — ABNORMAL HIGH (ref 70–99)
Glucose-Capillary: 142 mg/dL — ABNORMAL HIGH (ref 70–99)
Glucose-Capillary: 159 mg/dL — ABNORMAL HIGH (ref 70–99)

## 2020-08-17 LAB — CYTOLOGY - NON PAP

## 2020-08-17 LAB — MAGNESIUM: Magnesium: 1.9 mg/dL (ref 1.7–2.4)

## 2020-08-17 LAB — FERRITIN: Ferritin: 293 ng/mL (ref 24–336)

## 2020-08-17 LAB — D-DIMER, QUANTITATIVE: D-Dimer, Quant: 1.95 ug/mL-FEU — ABNORMAL HIGH (ref 0.00–0.50)

## 2020-08-17 LAB — C-REACTIVE PROTEIN: CRP: 1.5 mg/dL — ABNORMAL HIGH (ref <1.0)

## 2020-08-17 MED ORDER — SODIUM CHLORIDE (PF) 0.9 % IJ SOLN
10.0000 mg | INTRAMUSCULAR | Status: AC
  Administered 2020-08-17: 10 mg via INTRAPLEURAL
  Filled 2020-08-17: qty 10

## 2020-08-17 MED ORDER — STERILE WATER FOR INJECTION IJ SOLN
5.0000 mg | RESPIRATORY_TRACT | Status: AC
  Administered 2020-08-17: 5 mg via INTRAPLEURAL
  Filled 2020-08-17: qty 5

## 2020-08-17 MED ORDER — AMOXICILLIN-POT CLAVULANATE 875-125 MG PO TABS
1.0000 | ORAL_TABLET | Freq: Two times a day (BID) | ORAL | Status: DC
  Administered 2020-08-17 – 2020-08-19 (×4): 1 via ORAL
  Filled 2020-08-17 (×4): qty 1

## 2020-08-17 NOTE — Progress Notes (Signed)
Physical Therapy Treatment Patient Details Name: Jeremiah [name] MRN: [id number] DOB: [birth year] Today's Date: 08/17/2020    History of Present Illness 74 yo male with L foot pain was found to be Covid positive.  Pt had L foot revascularization done 06/20/20.  Note a heel wound as well on LLE.  PMHx:  R AKA, DM, PVD, HTN, atherosclerosis, C-diff, gout, PNA, sleep apnea,     PT Comments    Pt supine on arrival, agreeable to therapy session with heavy encouragement, with limited participation and tolerance for session. Pt bed soaked in urine on staff arrival to room, pt rolled to L/R sides x2 reps with minA, not agreeable to EOB or OOB mobility 2/2 pain from new CT on R side, pt instructed on R side UE precautions not to lift RUE >90 deg.  Pt continues to benefit from PT services to progress toward functional mobility goals. Continue to recommend SNF level of rehab.   Follow Up Recommendations  SNF     Equipment Recommendations  None recommended by PT    Recommendations for Other Services       Precautions / Restrictions Precautions Precautions: Fall Precaution Comments: R AKA Required Braces or Orthoses: Other Brace Other Brace: RLE prosthesis    Mobility  Bed Mobility Overal bed mobility: Needs Assistance Bed Mobility: Rolling Rolling: Min assist         General bed mobility comments: total A +2 for posterior supine scoot with bed in trendelenburg, minA for rolling with use of hand rails each direction x2 reps ea. Pt refused EOB  Transfers                    Ambulation/Gait                 Stairs             Wheelchair Mobility    Modified Rankin (Stroke Patients Only)       Balance                                            Cognition Arousal/Alertness: Awake/alert Behavior During Therapy: WFL for tasks assessed/performed Overall Cognitive Status: Within Functional Limits for tasks assessed Area of Impairment:  Awareness;Problem solving;Following commands;Safety/judgement                       Following Commands: Follows multi-step commands with increased time;Follows one step commands with increased time Safety/Judgement: Decreased awareness of safety;Decreased awareness of deficits   Problem Solving: Slow processing;Difficulty sequencing;Requires verbal cues;Decreased initiation;Requires tactile cues General Comments: pt refusing OOB transfers despite encouragement, bed soaked in urine and pt agreeable to rolling and supine scooting during bed change; refused EOB despite encouragement due to R chest pain      Exercises      General Comments        Pertinent Vitals/Pain Pain Assessment: Faces Pain Score: 8  Faces Pain Scale: Hurts whole lot Pain Location: LLE Pain Descriptors / Indicators: Grimacing;Guarding Pain Intervention(s): Limited activity within patient's tolerance;Monitored during session;Repositioned;Relaxation    Home Living                      Prior Function            PT Goals (current goals can now be found in the care plan section) Acute  Rehab PT Goals Patient Stated Goal: to get stronger and have enough help PT Goal Formulation: With patient Time For Goal Achievement: 08/24/20 Potential to Achieve Goals: Good Progress towards PT goals: Progressing toward goals (slow progress 2/2 new CT)    Frequency    Min 3X/week      PT Plan Current plan remains appropriate    Co-evaluation              AM-PAC PT "6 Clicks" Mobility   Outcome Measure  Help needed turning from your back to your side while in a flat bed without using bedrails?: A Lot Help needed moving from lying on your back to sitting on the side of a flat bed without using bedrails?: A Lot Help needed moving to and from a bed to a chair (including a wheelchair)?: Total Help needed standing up from a chair using your arms (e.g., wheelchair or bedside chair)?: Total Help  needed to walk in hospital room?: Total Help needed climbing 3-5 steps with a railing? : Total 6 Click Score: 8    End of Session   Activity Tolerance: Patient limited by fatigue;Treatment limited secondary to medical complications (Comment) Patient left: in bed;with call bell/phone within reach;with bed alarm set Nurse Communication: Mobility status PT Visit Diagnosis: Other abnormalities of gait and mobility (R26.89);Muscle weakness (generalized) (M62.81);Difficulty in walking, not elsewhere classified (R26.2);Adult, failure to thrive (R62.7)     Time: 6767-2094 PT Time Calculation (min) (ACUTE ONLY): 29 min  Charges:  $Therapeutic Activity: 23-37 mins                     Montrail Mehrer P., PTA Acute Rehabilitation Services Pager: [phone] Office: [phone]   Angus Palms 08/17/2020, 4:54 PM

## 2020-08-17 NOTE — Progress Notes (Signed)
[facility] NOTE    DELAWRENCE FRIDMAN  ZOX:[id number] DOB: 1946-08-16 DOA: 08/09/2020 PCP: Annita Brod, Jeremiah   Chief Complaint  Patient presents with  . Code Sepsis    Brief Narrative:   [name], HTN, iron deficiency anemia, [name], [name], chronic diastolic CHF, aortic stenosis, [name], [name], [name], [name], apparently has had diarrhea for the last several weeks, upon arrival to the ER he was found to have possible COVID-19 pneumonia versus aspiration pneumonia along with dehydration and metabolic encephalopathy and he was admitted to the hospital.  He is fully vaccinated for COVID-19.  Assessment & Plan:   Active Problems:   PVD (peripheral vascular disease) (HCC)   Hypertension   HLD (hyperlipidemia)   Leukocytosis   S/P AKA (above knee amputation) (HCC)   Diabetes mellitus type 2 in nonobese (HCC)   Dyslipidemia   Type 2 diabetes mellitus with peripheral neuropathy (HCC)   Pneumonia due to COVID-19 virus   Acute metabolic encephalopathy   Dysphagia  Acute Hypoxic Respiratory Failure  Aspiration Pneumonia  Chronic Right Pleural Effusion, exudative  COVID 19 infection Vaccinated CXR 9/22 with loculated R effusion CT 9/17 with enlarging chronic R effusion and patchy R upper lobe airspace disease Currently on RA, had mild hypoxia earlier in hospitalization Per previous provider, COVID 19 infection was not thought to be primary driver of his illness.  S/p 5 days of remdesivir. Being treated for aspiration pneumonia with unasyn - abx now completed Pulm c/s for chronic pulm effusion, appreciate assistance Blood and urine cx NG.  MRSA PCR negative. CXR 9/24 with pigtail catheter on R, loculated pleural effusion with patchy atelectasis/infiltrate on R S/p pigtail 9/23, follow thora labs - exudative effusion by light's - cytology and culture pending    COVID-19 Labs  Recent Labs    08/15/20 1130 08/16/20 0908 08/16/20 2057 08/17/20 0431  DDIMER 2.29* 2.30*  --  1.95*  FERRITIN  --  275  --  293  LDH  --   --  134  --   CRP 2.1* 1.9*  --  1.5*    Lab Results  Component Value Date   SARSCOV2NAA POSITIVE (Idabelle Mcpeters) 08/09/2020   SARSCOV2NAA NEGATIVE 06/25/2020   SARSCOV2NAA NEGATIVE 06/18/2020   SARSCOV2NAA NEGATIVE 04/18/2020   PAD with R AKA, recent L. SMA stent with Chronic left heel arterial insufficiency ulcer present on admission.  Wound care on board, continue on dual antiplatelet therapy along with statin for secondary prevention, he follows with Dr. [name], wound care has been consulted, seen by vascular surgery for now wound actually now seems to be improving per vascular surgery, continue supportive care.  Case discussed with Dr. [name], follow-up with him post discharge in 1 to 2 weeks.   CT PE with possible small linea filling defect that could relate to previous PE, unclear significance, no acute PE, will continue to monitor  4. Acute Metabolic encephalopathy upon admission. Likely 2/2 aspiration pneumonia. Now improved.  6.  History of aortic stenosis at least moderate per recent echocardiogram.  Outpatient cardiology follow-up.  Aortic valve area is 1.34 cm and V-max is 3.27 m/s.  Continue beta-blocker.  Avoid sudden preload reduction.   7.  Essential hypertension.  On beta-blocker, dose adjusted as he was slightly bradycardic (holding parameters), added Norvasc as well for better control.  8.  Dehydration due to diarrhea.  Diarrhea seems to  have resolved. Improved after IV fluids.  9.  CKD 3A.  Baseline creatinine around 1.4.  Monitor.  10. Chronic iron deficiency anemia  Vitamin [name] Continue iron supplementation Start B12 supplementation based on August labs Repeat labs - normal iron, b12 folate Relatively stable Hb  11.  Elevated D-dimer.  Likely due to infection and inflammation, negative lower  extremity ultrasound.  No clinical signs or symptoms of PE.  Most likely D-dimer high due to inflammation from aspiration pneumonia.  12.  Hypokalemia.  Replaced.    13. DM type II.  On sliding scale.  Pressure Ulcer Frequent turns, foam Pressure Injury 08/10/20 Sacrum Posterior Stage 2 -  Partial thickness loss of dermis presenting as Amia Rynders shallow open injury with Jossie Smoot red, pink wound bed without slough. pink - Quarter size and dime sized areas (Active)  08/10/20 2145  Location: Sacrum  Location Orientation: Posterior  Staging: Stage 2 -  Partial thickness loss of dermis presenting as Brentin Shin shallow open injury with Caiden Arteaga red, pink wound bed without slough.  Wound Description (Comments): pink - Quarter size and dime sized areas  Present on Admission: No    DVT prophylaxis: lovenox Code Status: full Family Communication: wife 9/23 Disposition:   Status is: Inpatient  Remains inpatient appropriate because:Inpatient level of care appropriate due to severity of illness   Dispo: The patient is from: Home              Anticipated d/c is to: SNF              Anticipated d/c date is: > 3 days              Patient currently is not medically stable to d/c.       Consultants:   Pulm  Procedures:  LE [place] Summary:  RIGHT:  - There is no evidence of deep vein thrombosis in the lower extremity.    LEFT:  - There is no evidence of deep vein thrombosis in the lower extremity.   Antimicrobials: Anti-infectives (From admission, onward)   Start     Dose/Rate Route Frequency Ordered Stop   08/11/20 1000  remdesivir 100 mg in sodium chloride 0.9 % 100 mL IVPB       "Followed by" Linked Group Details   100 mg 200 mL/hr over 30 Minutes Intravenous Daily 08/09/20 2256 08/14/20 0955   08/10/20 2000  azithromycin (ZITHROMAX) 500 mg in sodium chloride 0.9 % 250 mL IVPB  Status:  Discontinued        500 mg 250 mL/hr over 60 Minutes Intravenous Every 24 hours 08/09/20 2256 08/12/20 1042   08/10/20  1800  cefTRIAXone (ROCEPHIN) 2 g in sodium chloride 0.9 % 100 mL IVPB  Status:  Discontinued        2 g 200 mL/hr over 30 Minutes Intravenous Every 24 hours 08/09/20 2256 08/09/20 2332   08/10/20 0015  Ampicillin-Sulbactam (UNASYN) 3 g in sodium chloride 0.9 % 100 mL IVPB        3 g 200 mL/hr over 30 Minutes Intravenous Every 8 hours 08/10/20 0002 08/16/20 1706   08/10/20 0000  remdesivir 200 mg in sodium chloride 0.9% 250 mL IVPB       "Followed by" Linked Group Details   200 mg 580 mL/hr over 30 Minutes Intravenous Once 08/09/20 2256 08/10/20 0250   08/09/20 2030  cefTRIAXone (ROCEPHIN) 1 g in sodium chloride 0.9 % 100 mL IVPB        1 g  200 mL/hr over 30 Minutes Intravenous  Once 08/09/20 2020 08/09/20 2139   08/09/20 2030  azithromycin (ZITHROMAX) 500 mg in sodium chloride 0.9 % 250 mL IVPB        500 mg 250 mL/hr over 60 Minutes Intravenous  Once 08/09/20 2020 08/09/20 2140      Subjective: No new complaints  Objective: Vitals:   08/16/20 1943 08/17/20 0346 08/17/20 0500 08/17/20 0908  BP: (!) 139/54 (!) 143/60  (!) 142/41  Pulse: (!) 52 (!) 57  64  Resp: 17 17  18   Temp: 98.6 F (37 C) 98.6 F (37 C)  98.5 F (36.9 C)  TempSrc: Oral Oral    SpO2: 100% 98%  99%  Weight:   62.9 kg   Height:        Intake/Output Summary (Last 24 hours) at 08/17/2020 0954 Last data filed at 08/17/2020 0600 Gross per 24 hour  Intake 560 ml  Output 1900 ml  Net -1340 ml   Filed Weights   08/10/20 2121 08/16/20 0455 08/17/20 0500  Weight: 61.6 kg 63.7 kg 62.9 kg    Examination:  General: No acute distress. Cardiovascular: Heart sounds show Nikie Cid regular rate, and rhythm. Lungs: Clear to auscultation bilaterally, R pigtail  Abdomen: Soft, nontender, nondistended  Neurological: Alert and oriented 3. Moves all extremities 4. Cranial nerves II through XII grossly intact. Skin: pressure injury not evaluated today Extremities: No clubbing or cyanosis. R AKA.    Data Reviewed: I have  personally reviewed following labs and imaging studies  CBC: Recent Labs  Lab 08/13/20 0402 08/14/20 0859 08/15/20 1130 08/16/20 0908 08/17/20 0431  WBC 11.0* 12.7* 11.3* 13.3* 10.8*  NEUTROABS 7.9* 9.6* 8.0* 9.3* 7.2  HGB 9.4* 10.6* 8.3* 8.7* 8.6*  HCT 28.8* 32.1* 25.6* 27.0* 26.7*  MCV 83.5 83.2 82.8 82.1 83.2  PLT 258 293 219 236 213    Basic Metabolic Panel: Recent Labs  Lab 08/13/20 0402 08/14/20 0859 08/15/20 1130 08/16/20 0908 08/17/20 0431  NA 139 138 137 139 136  K 3.7 3.3* 2.9* 3.3* 3.8  CL 104 103 104 106 106  CO2 26 24 25 24 25   GLUCOSE 112* 155* 157* 117* 104*  BUN 40* 35* 29* 24* 19  CREATININE 1.46* 1.37* 1.42* 1.28* 1.26*  CALCIUM 8.6* 8.9 8.0* 8.4* 8.1*  MG 2.0 1.9 1.9 1.8 1.9  PHOS  --   --   --   --  2.3*    GFR: Estimated Creatinine Clearance: 44.4 mL/min (Caitlynne Harbeck) (by C-G formula based on SCr of 1.26 mg/dL (H)).  Liver Function Tests: Recent Labs  Lab 08/11/20 0214 08/12/20 1323 08/13/20 0402 08/14/20 0859 08/17/20 0431  AST 23 33 25 22 14*  ALT 17 30 29  35 19  ALKPHOS 67 71 72 77 64  BILITOT 0.5 0.4 0.5 0.5 0.4  PROT 6.5 7.1 7.2 7.7 6.0*  ALBUMIN 1.9* 2.1* 2.2* 2.6* 2.2*    CBG: Recent Labs  Lab 08/16/20 1146 08/16/20 1531 08/16/20 1944 08/17/20 0632 08/17/20 0748  GLUCAP 190* 219* 99 103* 149*     Recent Results (from the past 240 hour(s))  Blood culture (routine single)     Status: [name]   Collection Time: 08/09/20  7:28 PM   Specimen: BLOOD  Result Value Ref Range Status   Specimen Description BLOOD RIGHT ANTECUBITAL  Final   Special Requests   Final    BOTTLES DRAWN AEROBIC ONLY Blood Culture results may not be optimal due to an inadequate volume of  blood received in culture bottles   Culture   Final    NO GROWTH 5 DAYS Performed at [facility], [address] [place], [place] [address]    Report Status 08/14/2020 FINAL  Final  SARS Coronavirus 2 by RT PCR (hospital order, performed in [facility] [place] hospital  lab) Nasopharyngeal Nasopharyngeal Swab     Status: Abnormal   Collection Time: 08/09/20  7:56 PM   Specimen: Nasopharyngeal Swab  Result Value Ref Range Status   SARS Coronavirus 2 POSITIVE (Cressie Betzler) NEGATIVE Final    Comment: RESULT CALLED TO, READ BACK BY AND VERIFIED WITH: B,OSORIO  08/09/20 EB (NOTE) SARS-CoV-2 target nucleic acids are DETECTED  SARS-CoV-2 RNA is generally detectable in upper respiratory specimens  during the acute phase of infection.  Positive results are indicative  of the presence of the identified virus, but do not rule out bacterial infection or co-infection with other pathogens not detected by the test.  Clinical correlation with patient history and  other diagnostic information is necessary to determine patient infection status.  The expected result is negative.  Fact Sheet for Patients:   BoilerBrush.com.cy   Fact Sheet for Healthcare Providers:   https://pope.com/    This test is not yet approved or cleared by the [place] FDA and  has been authorized for detection and/or diagnosis of SARS-CoV-2 by FDA under an Emergency Use Authorization (EUA).  This EUA will remain in effect (meaning this test can be  used) for the duration of  the COVID-19 declaration under Section 564(b)(1) of the Act, 21 U.S.C. section 360-bbb-3(b)(1), unless the authorization is terminated or revoked sooner.  Performed at [facility], [address], [place], [place] [address]   Urine culture     Status: [name]   Collection Time: 08/09/20 10:26 PM   Specimen: In/Out Cath Urine  Result Value Ref Range Status   Specimen Description IN/OUT CATH URINE  Final   Special Requests [name]  Final   Culture   Final    NO GROWTH Performed at [facility], [address], [place], [place] [address]    Report Status 08/10/2020 FINAL  Final  Culture, blood (Routine X 2) w Reflex to ID Panel     Status: [name]   Collection  Time: 08/10/20  3:18 AM   Specimen: BLOOD LEFT HAND  Result Value Ref Range Status   Specimen Description BLOOD LEFT HAND  Final   Special Requests   Final    BOTTLES DRAWN AEROBIC AND ANAEROBIC Blood Culture adequate volume   Culture   Final    NO GROWTH 5 DAYS Performed at [facility], [address], [place], [place] [address]    Report Status 08/15/2020 FINAL  Final  Culture, blood (Routine X 2) w Reflex to ID Panel     Status: [name]   Collection Time: 08/10/20  2:53 PM   Specimen: BLOOD  Result Value Ref Range Status   Specimen Description BLOOD SITE NOT SPECIFIED  Final   Special Requests   Final    BOTTLES DRAWN AEROBIC AND ANAEROBIC Blood Culture adequate volume   Culture   Final    NO GROWTH 5 DAYS Performed at [facility], [address], [place], [place] [address]    Report Status 08/15/2020 FINAL  Final  MRSA PCR Screening     Status: [name]   Collection Time: 08/10/20 10:00 PM   Specimen: Nasal Mucosa; Nasopharyngeal  Result Value Ref  Range Status   MRSA by PCR NEGATIVE NEGATIVE Final    Comment:        The GeneXpert MRSA Assay (FDA approved for NASAL specimens only), is one component of [place] comprehensive MRSA colonization surveillance program. It is not intended to diagnose MRSA infection nor to guide or monitor treatment for MRSA infections. Performed at [facility], [address] [place], [place] [address]   Body fluid culture (includes gram stain)     Status: [name] (Preliminary result)   Collection Time: 08/16/20  2:07 PM   Specimen: Pleural Fluid  Result Value Ref Range Status   Specimen Description FLUID  Final   Special Requests [name]  Final   Gram Stain   Final    RARE WBC PRESENT,BOTH PMN AND MONONUCLEAR NO ORGANISMS SEEN CYTOSPIN SMEAR    Culture   Final    NO GROWTH < 24 HOURS Performed at [facility], [address] [place], [place] [address]    Report Status PENDING  Incomplete         Radiology  Studies: DG Chest 1 View  Result Date: 08/17/2020 CLINICAL DATA:  Chest tube present.  Pleural effusion. EXAM: CHEST  1 VIEW COMPARISON:  August 16, 2020 FINDINGS: Pigtail catheter again noted on the right, unchanged in position. No evident pneumothorax. Loculated pleural effusion again noted with areas of atelectasis and patchy airspace opacity in the lateral right mid and lower lung regions. Left lung is clear. Heart is mildly prominent, stable, with pulmonary vascularity normal. No adenopathy. There is aortic atherosclerosis. Ottilia Pippenger focus of sclerosis is noted in the proximal right humerus, incompletely visualized. IMPRESSION: Stable pigtail catheter position on the right. Loculated pleural effusion with patchy atelectasis/infiltrate on the right. Left lung clear. Stable cardiac prominence. No pneumothorax. Small chondroid matrix lesion in the proximal right humerus consistent with either small enchondroma or infarct. Aortic Atherosclerosis (ICD10-I70.0). Electronically Signed   By: Bretta BangWilliam  Woodruff III M.D.   On: 08/17/2020 08:02   DG Chest 1 View  Result Date: 08/16/2020 CLINICAL DATA:  Chest tube in place. EXAM: CHEST  1 VIEW COMPARISON:  Radiograph yesterday.  CT 08/10/2020 FINDINGS: Placement of right pigtail catheter with catheter tip in the mid lower lung zone. Slight decreased right pleural effusion with partially loculated fluid persisting inferior laterally as well as right basilar atelectasis. No visualized pneumothorax. Heart is upper normal size. Unchanged mediastinal contours. Aortic atherosclerosis. Left lung essentially clear. IMPRESSION: Placement of right pigtail catheter with slight decreased right pleural effusion. Residual partially loculated fluid in the inferior lateral right hemithorax. No visualized pneumothorax. Electronically Signed   By: Narda RutherfordMelanie  Sanford M.D.   On: 08/16/2020 15:07   DG CHEST PORT 1 VIEW  Result Date: 08/15/2020 CLINICAL DATA:  Pleural effusion.  COVID  positive. EXAM: PORTABLE CHEST 1 VIEW COMPARISON:  CTA chest dated August 10, 2020. Chest x-ray dated August 09, 2020. FINDINGS: The heart size and mediastinal contours are within normal limits. Normal pulmonary vascularity. Unchanged partially loculated right pleural effusion with slightly improved aeration of the right middle and lower lobes. Left lung is clear. No pneumothorax. No acute osseous abnormality. Partially visualized small chondroid lesion in the right proximal humerus, likely an enchondroma. IMPRESSION: 1. Unchanged loculated right pleural effusion with slightly improved aeration of the right middle and lower lobes. Electronically Signed   By: Obie DredgeWilliam T Derry M.D.   On: 08/15/2020 16:30        Scheduled Meds: . amLODipine  10 mg Oral  Daily  . vitamin C  500 mg Oral Daily  . aspirin  81 mg Oral Daily  . atorvastatin  20 mg Oral Daily  . brimonidine  1 drop Both Eyes BID  . enoxaparin (LOVENOX) injection  40 mg Subcutaneous Q24H  . feeding supplement (ENSURE ENLIVE)  237 mL Oral TID BM  . ferrous sulfate  325 mg Oral BID WC  . hydrALAZINE  50 mg Oral Q8H  . insulin aspart  0-9 Units Subcutaneous TID WC  . metoprolol succinate  25 mg Oral Daily  . pantoprazole  40 mg Oral Daily  . sodium chloride flush  10 mL Intracatheter Q8H  . sodium chloride flush  3 mL Intravenous Q12H  . timolol  1 drop Both Eyes BID  . vitamin B-12  1,000 mcg Oral Daily  . zinc sulfate  220 mg Oral Daily   Continuous Infusions:    LOS: 8 days    Time spent: over 30 min    Lacretia Nicks, [facility]   To contact the attending provider between 7A-7P or the covering provider during after hours 7P-7A, please log into the web site [email or web address] and access using universal [place] password for that web site. If you do not have the password, please call the hospital operator.  08/17/2020, 9:54 AM

## 2020-08-17 NOTE — Consult Note (Signed)
NAME:  Jeremiah [name], MRN:  [id number], DOB:  May 20, 1946, LOS: 8 ADMISSION DATE:  08/09/2020, CONSULTATION DATE:  9/22 REFERRING MD:  singh, CHIEF COMPLAINT:  Pleural effusion      History of present illness   This is a 74 year old male patient with history as mentioned below.  Admitted to the hospital on 9/16 with chief complaint of confusion, diarrhea, and dehydration.  Ultimately admitted with working diagnosis of what was felt to be a mix of both aspiration pneumonia, further complicated by incidental finding of positive Covid test of note he was fully vaccinated.  He was admitted to the internal medicine service therapeutic interventions have included IV antibiotics, IV hydration, and supportive care.  He was additionally started on IV steroids in remdesivir.  He did have an elevated D-dimer, had No clinical findings of pulmonary embolus but a CT of chest was obtained to further evaluate, this was negative for pulmonary embolus however it did show enlarging what appeared to be chronic right pleural effusion with pleural rind, and compressive atelectasis of the right middle and lower lobe.  During his hospitalization he has made slow but steady improvement shortness of breath is improved, hypoxia resolved, metabolic encephalopathy has resolved.  However because of his chronic right pleural effusion and concerned about this being related to recurrent aspiration pulmonary was asked to evaluate on 9/22   Past Medical History  Left SFA stent, chronic nonhealing left heel ulcer.  Being followed by vascular surgery HTN, iron def anemia, [name], B12, chronic diastolic HF, AS< DM right AKA Peripheral artery disease  Significant Hospital Events   9/16 admitted Consults:  Vascular surgery Pulmonary Procedures:    Significant Diagnostic Tests:  CT of chest:9/17 shows chronic dependent right pleural effusion with associated pleural thickening this is larger when comparing prior CT exam there  is associated atelectasis involving the right middle and lower lobe worrisome for pneumonia.  Cannot exclude potential malignancy  Micro Data:  9/17 blood cultures x2: Negative  Antimicrobials:  unasyn 9/16>>>  Interim history/subjective:  Feels better  Objective   Blood pressure (!) 178/67, pulse 64, temperature 98.5 F (36.9 C), resp. rate 18, height 5\' 9"  (1.753 m), weight 62.9 kg, SpO2 99 %.        Intake/Output Summary (Last 24 hours) at 08/17/2020 1129 Last data filed at 08/17/2020 0600 Gross per 24 hour  Intake 560 ml  Output 1900 ml  Net -1340 ml   Filed Weights   08/10/20 2121 08/16/20 0455 08/17/20 0500  Weight: 61.6 kg 63.7 kg 62.9 kg    Examination: General: 74 year old black male resting in bed no acute distress HENT: Normocephalic atraumatic Lungs: Resting comfortably in bed no accessory use currently room air diminished right base Cardiovascular: Regular rate and rhythm Abdomen: Soft nonten  Extremities: right AKA left lower extremity dressing intact Neuro: Awake oriented GU: Voids  Resolved Hospital Problem list   Aspiration pneumonia Acute metabolic encephalopathy  Assessment & Plan:  Loculated right pleural effusion Recent aspiration pneumonia Resolved acute metabolic encephalopathy Resolved acute respiratory failure History of aortic stenosis History of hypertension History of severe peripheral vascular disease in arterial ulceration involving the left foot CKD stage III Chronic iron deficiency anemia   Pulmonary problem list: Loculated right pleural effusion with pleural rind.  In setting of what also appears to be recent aspiration pneumonia, although postobstructive atelectasis and associated chronic pleural effusion in the setting of malignancy also a consideration.  Plan  Status post pigtail catheter placement  08/16/2020    Have organized with SWOT nurse Will place right thoracostomy tube to facilitate complete drainage of right  pleural effusion, send fluid for evaluation He may require TPA and DNase for complete evacuation of chest Cont unasyn   Best practice:  Per primary  Labs   CBC: Recent Labs  Lab 08/13/20 0402 08/14/20 0859 08/15/20 1130 08/16/20 0908 08/17/20 0431  WBC 11.0* 12.7* 11.3* 13.3* 10.8*  NEUTROABS 7.9* 9.6* 8.0* 9.3* 7.2  HGB 9.4* 10.6* 8.3* 8.7* 8.6*  HCT 28.8* 32.1* 25.6* 27.0* 26.7*  MCV 83.5 83.2 82.8 82.1 83.2  PLT 258 293 219 236 213    Basic Metabolic Panel: Recent Labs  Lab 08/13/20 0402 08/14/20 0859 08/15/20 1130 08/16/20 0908 08/17/20 0431  NA 139 138 137 139 136  K 3.7 3.3* 2.9* 3.3* 3.8  CL 104 103 104 106 106  CO2 26 24 25 24 25   GLUCOSE 112* 155* 157* 117* 104*  BUN 40* 35* 29* 24* 19  CREATININE 1.46* 1.37* 1.42* 1.28* 1.26*  CALCIUM 8.6* 8.9 8.0* 8.4* 8.1*  MG 2.0 1.9 1.9 1.8 1.9  PHOS  --   --   --   --  2.3*   GFR: Estimated Creatinine Clearance: 44.4 mL/min (A) (by C-G formula based on SCr of 1.26 mg/dL (H)). Recent Labs  Lab 08/11/20 0214 08/11/20 0214 08/12/20 1323 08/12/20 1323 08/13/20 0402 08/13/20 0402 08/14/20 0859 08/15/20 1130 08/16/20 0908 08/17/20 0431  PROCALCITON 1.34  --  0.58  --  0.44  --  0.15  --   --   --   WBC 16.4*   < > 15.7*   < > 11.0*   < > 12.7* 11.3* 13.3* 10.8*   < > = values in this interval not displayed.    Liver Function Tests: Recent Labs  Lab 08/11/20 0214 08/12/20 1323 08/13/20 0402 08/14/20 0859 08/17/20 0431  AST 23 33 25 22 14*  ALT 17 30 29  35 19  ALKPHOS 67 71 72 77 64  BILITOT 0.5 0.4 0.5 0.5 0.4  PROT 6.5 7.1 7.2 7.7 6.0*  ALBUMIN 1.9* 2.1* 2.2* 2.6* 2.2*   No results for input(s): LIPASE, AMYLASE in the last 168 hours. No results for input(s): AMMONIA in the last 168 hours.  ABG    Component Value Date/Time   HCO3 26.1 08/10/2020 1507   TCO2 27 08/10/2020 1507   O2SAT 99.0 08/10/2020 1507     Coagulation Profile: No results for input(s): INR, PROTIME in the last 168  hours.  Cardiac Enzymes: No results for input(s): CKTOTAL, CKMB, CKMBINDEX, TROPONINI in the last 168 hours.  HbA1C: Hgb A1c MFr Bld  Date/Time Value Ref Range Status  08/10/2020 12:33 AM 6.0 (H) 4.8 - 5.6 % Final    Comment:    (NOTE) Pre diabetes:          5.7%-6.4%  Diabetes:              >6.4%  Glycemic control for   <7.0% adults with diabetes   06/18/2020 03:59 PM 6.1 (H) 4.8 - 5.6 % Final    Comment:    (NOTE) Pre diabetes:          5.7%-6.4%  Diabetes:              >6.4%  Glycemic control for   <7.0% adults with diabetes     CBG: Recent Labs  Lab 08/16/20 1146 08/16/20 1531 08/16/20 1944 08/17/20 0632 08/17/20 0748  GLUCAP 190* 219*  99 103* 149Brett Canales Jeremiah [name] ACNP Acute Care Nurse Practitioner [facility] Pulmonary/Critical Care Please consult Amion 08/17/2020, 11:30 AM

## 2020-08-17 NOTE — TOC Progression Note (Signed)
Transition of Care [facility]) - Progression Note    Patient Details  Name: Jeremiah [name] MRN: [id number] Date of Birth: 1946/06/14  Transition of Care [facility]) CM/SW Contact  Mearl Latin, LCSW Phone Number: 08/17/2020, 3:33 PM  Clinical Narrative:    CSW received call from patient's son inquiring if bed would still be available at [facility] once patient is ready. CSW explained that it depends and we will keep him updated once patient is closer to being medically stable.     Expected Discharge Plan: Home w Home Health Services Barriers to Discharge: Continued Medical Work up  Expected Discharge Plan and Services Expected Discharge Plan: Home w Home Health Services In-house Referral: Clinical Social Work     Living arrangements for the past 2 months: Single Family Home                                       Social Determinants of Health (SDOH) Interventions    Readmission Risk Interventions Readmission Risk Prevention Plan 08/13/2020 07/08/2018 07/08/2018  Transportation Screening Complete - Complete  Medication Review [employer]) Referral to Pharmacy - Complete  PCP or Specialist appointment within 3-5 days of discharge Complete - Not Complete  HRI or Home Care Consult Complete (No Data) Complete  SW Recovery Care/Counseling Consult Complete Patient refused Not Complete  Palliative Care Screening Not Applicable - Not Complete  Comments - - (No Data)  Medication Reconcilation (Pharmacy) - - Not Complete  Skilled Nursing Facility Complete - Patient refused  Some recent data might be hidden

## 2020-08-17 NOTE — Progress Notes (Signed)
08/17/2020 Seen in f/u for pleural effusion.  74 year old man with history of PAD, Dm with AMS, dehydration, HTN, CKD, aoritc stenosis who presented on 9/16 with cough, confusion, SOB, fever found to be COVID positive.  Full vaccinated.  During hospitalization has improved with treatment for CAP but has a nonresolving right loculated effusion for which PCCM is consulted.  No events overnight.  Minimal pleural fluid output.  Labs more c/w transudate, loculated effusion remains on CXR.  On exam, crackles/rhonci at right base. Thin man in NAD. No accessory muscle use. Ext no edema. Atrium with tidaling and no air leak.  Lab review: K low, WBC slightly elevated CT: loculated R effusion vs. Empyema  A: COVID infection Aspiration pneumonia vs. CAP Chronic enlarging R effusion since at least May 2019, question chronic + infected on top Question of endobronchial obstruction vs. Related to mass effect of R effusion  P: - Pleural lytics today, may repeat tomorrow - If no response to this may just pull tube Sunday and do outpatient f/u - f/u pleural fluid cytology - Hold plavix in case needs bronch/biopsy - Will follow with you  [name] MD PCCM

## 2020-08-17 NOTE — Progress Notes (Signed)
There is not a spot to document the chest tube output or input so this nurse is putting the info here.  Input 10 ml Normal Saline flush Output Serosanguineous fluid  This nurse marked the area on the chamber of where the fluid stopped for her shift.

## 2020-08-18 DIAGNOSIS — Z9689 Presence of other specified functional implants: Secondary | ICD-10-CM

## 2020-08-18 LAB — COMPREHENSIVE METABOLIC PANEL
ALT: 19 U/L (ref 0–44)
AST: 13 U/L — ABNORMAL LOW (ref 15–41)
Albumin: 2.1 g/dL — ABNORMAL LOW (ref 3.5–5.0)
Alkaline Phosphatase: 69 U/L (ref 38–126)
Anion gap: 9 (ref 5–15)
BUN: 21 mg/dL (ref 8–23)
CO2: 26 mmol/L (ref 22–32)
Calcium: 8.3 mg/dL — ABNORMAL LOW (ref 8.9–10.3)
Chloride: 102 mmol/L (ref 98–111)
Creatinine, Ser: 1.28 mg/dL — ABNORMAL HIGH (ref 0.61–1.24)
GFR calc Af Amer: >60 mL/min (ref >60)
GFR calc non Af Amer: 54 mL/min — ABNORMAL LOW (ref >60)
Glucose, Bld: 122 mg/dL — ABNORMAL HIGH (ref 70–99)
Potassium: 3.7 mmol/L (ref 3.5–5.1)
Sodium: 137 mmol/L (ref 135–145)
Total Bilirubin: 0.4 mg/dL (ref 0.3–1.2)
Total Protein: 5.8 g/dL — ABNORMAL LOW (ref 6.5–8.1)

## 2020-08-18 LAB — CBC WITH DIFFERENTIAL/PLATELET
Abs Immature Granulocytes: 0.06 10*3/uL (ref 0.00–0.07)
Basophils Absolute: 0 10*3/uL (ref 0.0–0.1)
Basophils Relative: 0 %
Eosinophils Absolute: 0.7 10*3/uL — ABNORMAL HIGH (ref 0.0–0.5)
Eosinophils Relative: 6 %
HCT: 24.2 % — ABNORMAL LOW (ref 39.0–52.0)
Hemoglobin: 7.9 g/dL — ABNORMAL LOW (ref 13.0–17.0)
Immature Granulocytes: 1 %
Lymphocytes Relative: 19 %
Lymphs Abs: 2.2 10*3/uL (ref 0.7–4.0)
MCH: 27.1 pg (ref 26.0–34.0)
MCHC: 32.6 g/dL (ref 30.0–36.0)
MCV: 82.9 fL (ref 80.0–100.0)
Monocytes Absolute: 1.1 10*3/uL — ABNORMAL HIGH (ref 0.1–1.0)
Monocytes Relative: 9 %
Neutro Abs: 7.8 10*3/uL — ABNORMAL HIGH (ref 1.7–7.7)
Neutrophils Relative %: 65 %
Platelets: 223 10*3/uL (ref 150–400)
RBC: 2.92 MIL/uL — ABNORMAL LOW (ref 4.22–5.81)
RDW: 14.1 % (ref 11.5–15.5)
WBC: 12 10*3/uL — ABNORMAL HIGH (ref 4.0–10.5)
nRBC: 0 % (ref 0.0–0.2)

## 2020-08-18 LAB — GLUCOSE, CAPILLARY
Glucose-Capillary: 113 mg/dL — ABNORMAL HIGH (ref 70–99)
Glucose-Capillary: 157 mg/dL — ABNORMAL HIGH (ref 70–99)
Glucose-Capillary: 198 mg/dL — ABNORMAL HIGH (ref 70–99)
Glucose-Capillary: 74 mg/dL (ref 70–99)
Glucose-Capillary: 169 mg/dL — ABNORMAL HIGH (ref 70–99)

## 2020-08-18 LAB — D-DIMER, QUANTITATIVE: D-Dimer, Quant: 1.97 ug/mL-FEU — ABNORMAL HIGH (ref 0.00–0.50)

## 2020-08-18 LAB — C-REACTIVE PROTEIN: CRP: 1 mg/dL — ABNORMAL HIGH (ref <1.0)

## 2020-08-18 LAB — FERRITIN: Ferritin: 258 ng/mL (ref 24–336)

## 2020-08-18 LAB — PHOSPHORUS: Phosphorus: 2.3 mg/dL — ABNORMAL LOW (ref 2.5–4.6)

## 2020-08-18 LAB — MAGNESIUM: Magnesium: 2 mg/dL (ref 1.7–2.4)

## 2020-08-18 MED ORDER — METOPROLOL SUCCINATE ER 25 MG PO TB24
12.5000 mg | ORAL_TABLET | Freq: Every day | ORAL | Status: DC
  Administered 2020-08-19 – 2020-08-21 (×3): 12.5 mg via ORAL
  Filled 2020-08-18 (×3): qty 1

## 2020-08-18 MED ORDER — STERILE WATER FOR INJECTION IJ SOLN
5.0000 mg | RESPIRATORY_TRACT | Status: AC
  Administered 2020-08-18: 5 mg via INTRAPLEURAL
  Filled 2020-08-18: qty 5

## 2020-08-18 MED ORDER — SODIUM CHLORIDE (PF) 0.9 % IJ SOLN
10.0000 mg | INTRAMUSCULAR | Status: AC
  Administered 2020-08-18: 10 mg via INTRAPLEURAL
  Filled 2020-08-18: qty 10

## 2020-08-18 NOTE — Progress Notes (Signed)
Chest tube turned to open @1330  per Minor, ACNP.

## 2020-08-18 NOTE — Progress Notes (Addendum)
PROGRESS NOTE    Jeremiah [name]  ZOX:[id number] DOB: November 18, 1946 DOA: 08/09/2020 PCP: Annita Brod, MD   Chief Complaint  Patient presents with  . Code Sepsis    Brief Narrative:   [name], HTN, iron deficiency anemia, [name], [name], chronic diastolic CHF, aortic stenosis, [name], [name], [name], [name], apparently has had diarrhea for the last several weeks, upon arrival to the ER he was found to have possible COVID-19 pneumonia versus aspiration pneumonia along with dehydration and metabolic encephalopathy and he was admitted to the hospital.  He is fully vaccinated for COVID-19.  Assessment & Plan:   Active Problems:   PVD (peripheral vascular disease) (HCC)   Hypertension   HLD (hyperlipidemia)   Leukocytosis   S/P AKA (above knee amputation) (HCC)   Diabetes mellitus type 2 in nonobese (HCC)   Dyslipidemia   Type 2 diabetes mellitus with peripheral neuropathy (HCC)   Pneumonia due to COVID-19 virus   Acute metabolic encephalopathy   Dysphagia   Chest tube in place  Acute Hypoxic Respiratory Failure  Aspiration Pneumonia  Chronic Right Pleural Effusion, exudative  COVID 19 infection Vaccinated CXR 9/22 with loculated R effusion CT 9/17 with enlarging chronic R effusion and patchy R upper lobe airspace disease Currently on RA, had mild hypoxia earlier in hospitalization Per previous provider, COVID 19 infection was not thought to be primary driver of his illness.  S/p 5 days of remdesivir. Being treated for aspiration pneumonia with unasyn - continue additional 7 days augmentin per pulm Pulm c/s for chronic pulm effusion, appreciate assistance Blood and urine cx NG.  MRSA PCR negative. CXR 9/24 with pigtail catheter on R, loculated pleural effusion with patchy atelectasis/infiltrate on R TPA x 2 per pulmonary  S/p pigtail 9/23, follow  thora labs - exudative effusion by light's - cytology (no malgnant cells) and culture (no growth)  COVID-19 Labs  Recent Labs    08/16/20 0908 08/16/20 2057 08/17/20 0431 08/18/20 0409  DDIMER 2.30*  --  1.95* 1.97*  FERRITIN 275  --  293 258  LDH  --  134  --   --   CRP 1.9*  --  1.5* 1.0*    Lab Results  Component Value Date   SARSCOV2NAA POSITIVE (Lamae Fosco) 08/09/2020   SARSCOV2NAA NEGATIVE 06/25/2020   SARSCOV2NAA NEGATIVE 06/18/2020   SARSCOV2NAA NEGATIVE 04/18/2020   PAD with R AKA, recent L. SMA stent with Chronic left heel arterial insufficiency ulcer present on admission.  Wound care on board, continue on dual antiplatelet therapy along with statin for secondary prevention, he follows with Dr. [name], wound care has been consulted, seen by vascular surgery for now wound actually now seems to be improving per vascular surgery, continue supportive care.  Case discussed with Dr. [name], follow-up with him post discharge in 1 to 2 weeks.   CT PE with possible small linea filling defect that could relate to previous PE, unclear significance, no acute PE, will continue to monitor  4. Acute Metabolic encephalopathy upon admission. Likely 2/2 aspiration pneumonia. Now improved.  6.  History of aortic stenosis at least moderate per recent echocardiogram.  Outpatient cardiology follow-up.  Aortic valve area is 1.34 cm and V-max is 3.27 m/s.  Continue beta-blocker.  Avoid sudden preload reduction.   7.  Essential hypertension.  On beta-blocker, dose decreased as he was slightly bradycardic (holding parameters), added  Norvasc as well for better control.  8.  Dehydration due to diarrhea.  Diarrhea seems to have resolved. Improved after IV fluids.  9.  CKD 3A.  Baseline creatinine around 1.4.  Monitor.  10. Chronic iron deficiency anemia  Vitamin [name] Continue iron supplementation Start B12 supplementation based on August labs Repeat labs - normal iron, b12  folate Relatively stable Hb  11.  Elevated D-dimer.  Likely due to infection and inflammation, negative lower extremity ultrasound.  No clinical signs or symptoms of PE.  Most likely D-dimer high due to inflammation from aspiration pneumonia.  12.  Hypokalemia.  Replaced.    13. DM type II.  On sliding scale.  Pressure Ulcer Frequent turns, foam Pressure Injury 08/10/20 Sacrum Posterior Stage 2 -  Partial thickness loss of dermis presenting as Emmett Bracknell shallow open injury with Acire Tang red, pink wound bed without slough. pink - Quarter size and dime sized areas (Active)  08/10/20 2145  Location: Sacrum  Location Orientation: Posterior  Staging: Stage 2 -  Partial thickness loss of dermis presenting as Jarmar Rousseau shallow open injury with Sundus Pete red, pink wound bed without slough.  Wound Description (Comments): pink - Quarter size and dime sized areas  Present on Admission: No    DVT prophylaxis: lovenox Code Status: full Family Communication: wife 9/25 Disposition:   Status is: Inpatient  Remains inpatient appropriate because:Inpatient level of care appropriate due to severity of illness   Dispo: The patient is from: Home              Anticipated d/c is to: SNF              Anticipated d/c date is: > 3 days              Patient currently is not medically stable to d/c.       Consultants:   Pulm  Procedures:  LE [place] Summary:  RIGHT:  - There is no evidence of deep vein thrombosis in the lower extremity.    LEFT:  - There is no evidence of deep vein thrombosis in the lower extremity.   Antimicrobials: Anti-infectives (From admission, onward)   Start     Dose/Rate Route Frequency Ordered Stop   08/17/20 2230  amoxicillin-clavulanate (AUGMENTIN) 875-125 MG per tablet 1 tablet        1 tablet Oral Every 12 hours 08/17/20 1636 08/24/20 2159   08/11/20 1000  remdesivir 100 mg in sodium chloride 0.9 % 100 mL IVPB       "Followed by" Linked Group Details   100 mg 200 mL/hr over 30 Minutes  Intravenous Daily 08/09/20 2256 08/14/20 0955   08/10/20 2000  azithromycin (ZITHROMAX) 500 mg in sodium chloride 0.9 % 250 mL IVPB  Status:  Discontinued        500 mg 250 mL/hr over 60 Minutes Intravenous Every 24 hours 08/09/20 2256 08/12/20 1042   08/10/20 1800  cefTRIAXone (ROCEPHIN) 2 g in sodium chloride 0.9 % 100 mL IVPB  Status:  Discontinued        2 g 200 mL/hr over 30 Minutes Intravenous Every 24 hours 08/09/20 2256 08/09/20 2332   08/10/20 0015  Ampicillin-Sulbactam (UNASYN) 3 g in sodium chloride 0.9 % 100 mL IVPB        3 g 200 mL/hr over 30 Minutes Intravenous Every 8 hours 08/10/20 0002 08/16/20 1706   08/10/20 0000  remdesivir 200 mg in sodium chloride 0.9% 250 mL IVPB       "  Followed by" Linked Group Details   200 mg 580 mL/hr over 30 Minutes Intravenous Once 08/09/20 2256 08/10/20 0250   08/09/20 2030  cefTRIAXone (ROCEPHIN) 1 g in sodium chloride 0.9 % 100 mL IVPB        1 g 200 mL/hr over 30 Minutes Intravenous  Once 08/09/20 2020 08/09/20 2139   08/09/20 2030  azithromycin (ZITHROMAX) 500 mg in sodium chloride 0.9 % 250 mL IVPB        500 mg 250 mL/hr over 60 Minutes Intravenous  Once 08/09/20 2020 08/09/20 2140      Subjective: No new complaints, asking when the tube will be d/c'd  Objective: Vitals:   08/18/20 0400 08/18/20 0500 08/18/20 0637 08/18/20 0800  BP: (!) 154/76   (!) 122/39  Pulse: (!) 58 (!) 59  61  Resp: 19 19  15   Temp:   98.3 F (36.8 C)   TempSrc:   Oral   SpO2: 99% 98%    Weight:      Height:        Intake/Output Summary (Last 24 hours) at 08/18/2020 1315 Last data filed at 08/18/2020 1100 Gross per 24 hour  Intake 260 ml  Output 745 ml  Net -485 ml   Filed Weights   08/10/20 2121 08/16/20 0455 08/17/20 0500  Weight: 61.6 kg 63.7 kg 62.9 kg    Examination:  General: No acute distress. Cardiovascular: Heart sounds show Theophile Harvie regular rate, and rhythm.  Lungs: Clear to auscultation bilaterally  Abdomen: Soft, nontender,  nondistended Neurological: Alert and oriented 3. Moves all extremities 4. Cranial nerves II through XII grossly intact. Skin: Warm and dry. No rashes or lesions. Extremities: No clubbing or cyanosis. No edema. R AKA.     Data Reviewed: I have personally reviewed following labs and imaging studies  CBC: Recent Labs  Lab 08/14/20 0859 08/15/20 1130 08/16/20 0908 08/17/20 0431 08/18/20 0409  WBC 12.7* 11.3* 13.3* 10.8* 12.0*  NEUTROABS 9.6* 8.0* 9.3* 7.2 7.8*  HGB 10.6* 8.3* 8.7* 8.6* 7.9*  HCT 32.1* 25.6* 27.0* 26.7* 24.2*  MCV 83.2 82.8 82.1 83.2 82.9  PLT 293 219 236 213 223    Basic Metabolic Panel: Recent Labs  Lab 08/14/20 0859 08/15/20 1130 08/16/20 0908 08/17/20 0431 08/18/20 0409  NA 138 137 139 136 137  K 3.3* 2.9* 3.3* 3.8 3.7  CL 103 104 106 106 102  CO2 24 25 24 25 26   GLUCOSE 155* 157* 117* 104* 122*  BUN 35* 29* 24* 19 21  CREATININE 1.37* 1.42* 1.28* 1.26* 1.28*  CALCIUM 8.9 8.0* 8.4* 8.1* 8.3*  MG 1.9 1.9 1.8 1.9 2.0  PHOS  --   --   --  2.3* 2.3*    GFR: Estimated Creatinine Clearance: 43.7 mL/min (Kenosha Doster) (by C-G formula based on SCr of 1.28 mg/dL (H)).  Liver Function Tests: Recent Labs  Lab 08/12/20 1323 08/13/20 0402 08/14/20 0859 08/17/20 0431 08/18/20 0409  AST 33 25 22 14* 13*  ALT 30 29 35 19 19  ALKPHOS 71 72 77 64 69  BILITOT 0.4 0.5 0.5 0.4 0.4  PROT 7.1 7.2 7.7 6.0* 5.8*  ALBUMIN 2.1* 2.2* 2.6* 2.2* 2.1*    CBG: Recent Labs  Lab 08/17/20 1736 08/17/20 2103 08/18/20 0635 08/18/20 0736 08/18/20 1200  GLUCAP 142* 159* 113* 157* 198*     Recent Results (from the past 240 hour(s))  Blood culture (routine single)     Status: None   Collection Time: 08/09/20  7:28 PM  Specimen: BLOOD  Result Value Ref Range Status   Specimen Description BLOOD RIGHT ANTECUBITAL  Final   Special Requests   Final    BOTTLES DRAWN AEROBIC ONLY Blood Culture results may not be optimal due to an inadequate volume of blood received in  culture bottles   Culture   Final    NO GROWTH 5 DAYS Performed at [facility], [address] [place], [place] [address]    Report Status 08/14/2020 FINAL  Final  SARS Coronavirus 2 by RT PCR (hospital order, performed in [facility] hospital lab) Nasopharyngeal Nasopharyngeal Swab     Status: Abnormal   Collection Time: 08/09/20  7:56 PM   Specimen: Nasopharyngeal Swab  Result Value Ref Range Status   SARS Coronavirus 2 POSITIVE (Chelcey Caputo) NEGATIVE Final    Comment: RESULT CALLED TO, READ BACK BY AND VERIFIED WITH: B,OSORIO @2154  08/09/20 EB (NOTE) SARS-CoV-2 target nucleic acids are DETECTED  SARS-CoV-2 RNA is generally detectable in upper respiratory specimens  during the acute phase of infection.  Positive results are indicative  of the presence of the identified virus, but do not rule out bacterial infection or co-infection with other pathogens not detected by the test.  Clinical correlation with patient history and  other diagnostic information is necessary to determine patient infection status.  The expected result is negative.  Fact Sheet for Patients:   08/11/20   Fact Sheet for Healthcare Providers:   BoilerBrush.com.cy    This test is not yet approved or cleared by the https://pope.com/ FDA and  has been authorized for detection and/or diagnosis of SARS-CoV-2 by FDA under an Emergency Use Authorization (EUA).  This EUA will remain in effect (meaning this test can be  used) for the duration of  the COVID-19 declaration under Section 564(b)(1) of the Act, 21 U.S.C. section 360-bbb-3(b)(1), unless the authorization is terminated or revoked sooner.  Performed at [facility], [address] [place], [place]   Urine culture     Status: None   Collection Time: 08/09/20 10:26 PM   Specimen: In/Out Cath Urine  Result Value Ref Range Status   Specimen Description IN/OUT CATH URINE  Final   Special  Requests NONE  Final   Culture   Final    NO GROWTH Performed at [facility], [address] [place], [place]    Report Status 08/10/2020 FINAL  Final  Culture, blood (Routine X 2) w Reflex to ID Panel     Status: None   Collection Time: 08/10/20  3:18 AM   Specimen: BLOOD LEFT HAND  Result Value Ref Range Status   Specimen Description BLOOD LEFT HAND  Final   Special Requests   Final    BOTTLES DRAWN AEROBIC AND ANAEROBIC Blood Culture adequate volume   Culture   Final    NO GROWTH 5 DAYS Performed at [facility], [address], [place], [place]    Report Status 08/15/2020 FINAL  Final  Culture, blood (Routine X 2) w Reflex to ID Panel     Status: None   Collection Time: 08/10/20  2:53 PM   Specimen: BLOOD  Result Value Ref Range Status   Specimen Description BLOOD SITE NOT SPECIFIED  Final   Special Requests   Final    BOTTLES DRAWN AEROBIC AND ANAEROBIC Blood Culture adequate volume   Culture   Final    NO GROWTH 5 DAYS Performed at [facility]  Lab, [address] [place], [place] [address]    Report Status 08/15/2020 FINAL  Final  MRSA PCR Screening     Status: None   Collection Time: 08/10/20 10:00 PM   Specimen: Nasal Mucosa; Nasopharyngeal  Result Value Ref Range Status   MRSA by PCR NEGATIVE NEGATIVE Final    Comment:        The GeneXpert MRSA Assay (FDA approved for NASAL specimens only), is one component of Timohty Renbarger comprehensive MRSA colonization surveillance program. It is not intended to diagnose MRSA infection nor to guide or monitor treatment for MRSA infections. Performed at [facility], [address], [place], [place] [address]   Body fluid culture (includes gram stain)     Status: None (Preliminary result)   Collection Time: 08/16/20  2:07 PM   Specimen: Pleural Fluid  Result Value Ref Range Status   Specimen Description FLUID  Final   Special Requests NONE  Final   Gram Stain   Final    RARE WBC  PRESENT,BOTH PMN AND MONONUCLEAR NO ORGANISMS SEEN CYTOSPIN SMEAR    Culture   Final    NO GROWTH 2 DAYS Performed at [facility], [address], [place], [place] [address]    Report Status PENDING  Incomplete         Radiology Studies: DG Chest 1 View  Result Date: 08/17/2020 CLINICAL DATA:  Chest tube present.  Pleural effusion. EXAM: CHEST  1 VIEW COMPARISON:  August 16, 2020 FINDINGS: Pigtail catheter again noted on the right, unchanged in position. No evident pneumothorax. Loculated pleural effusion again noted with areas of atelectasis and patchy airspace opacity in the lateral right mid and lower lung regions. Left lung is clear. Heart is mildly prominent, stable, with pulmonary vascularity normal. No adenopathy. There is aortic atherosclerosis. Keashia Haskins focus of sclerosis is noted in the proximal right humerus, incompletely visualized. IMPRESSION: Stable pigtail catheter position on the right. Loculated pleural effusion with patchy atelectasis/infiltrate on the right. Left lung clear. Stable cardiac prominence. No pneumothorax. Small chondroid matrix lesion in the proximal right humerus consistent with either small enchondroma or infarct. Aortic Atherosclerosis (ICD10-I70.0). Electronically Signed   By: Bretta Bang III M.D.   On: 08/17/2020 08:02   DG Chest 1 View  Result Date: 08/16/2020 CLINICAL DATA:  Chest tube in place. EXAM: CHEST  1 VIEW COMPARISON:  Radiograph yesterday.  CT 08/10/2020 FINDINGS: Placement of right pigtail catheter with catheter tip in the mid lower lung zone. Slight decreased right pleural effusion with partially loculated fluid persisting inferior laterally as well as right basilar atelectasis. No visualized pneumothorax. Heart is upper normal size. Unchanged mediastinal contours. Aortic atherosclerosis. Left lung essentially clear. IMPRESSION: Placement of right pigtail catheter with slight decreased right pleural effusion. Residual partially  loculated fluid in the inferior lateral right hemithorax. No visualized pneumothorax. Electronically Signed   By: Narda Rutherford M.D.   On: 08/16/2020 15:07        Scheduled Meds: . alteplase (TPA) for intrapleural administration  10 mg Intrapleural STAT   And  . pulmozyme (DORNASE) for intrapleural administration  5 mg Intrapleural STAT  . amLODipine  10 mg Oral Daily  . amoxicillin-clavulanate  1 tablet Oral Q12H  . vitamin C  500 mg Oral Daily  . aspirin  81 mg Oral Daily  . atorvastatin  20 mg Oral Daily  . brimonidine  1 drop Both Eyes BID  . enoxaparin (LOVENOX) injection  40 mg Subcutaneous Q24H  .  feeding supplement (ENSURE ENLIVE)  237 mL Oral TID BM  . ferrous sulfate  325 mg Oral BID WC  . hydrALAZINE  50 mg Oral Q8H  . insulin aspart  0-9 Units Subcutaneous TID WC  . metoprolol succinate  25 mg Oral Daily  . pantoprazole  40 mg Oral Daily  . sodium chloride flush  10 mL Intracatheter Q8H  . sodium chloride flush  3 mL Intravenous Q12H  . timolol  1 drop Both Eyes BID  . vitamin B-12  1,000 mcg Oral Daily  . zinc sulfate  220 mg Oral Daily   Continuous Infusions:    LOS: 9 days    Time spent: over 30 min    Lacretia Nicksaldwell Powell, MD [facility]   To contact the attending provider between 7A-7P or the covering provider during after hours 7P-7A, please log into the web site [email or web address] and access using universal [place] password for that web site. If you do not have the password, please call the hospital operator.  08/18/2020, 1:15 PM

## 2020-08-18 NOTE — Progress Notes (Addendum)
Case discussed with primary and NP.  Would do another dose of tpa today, check AM CXR and likely remove chest tube in AM.  Fluid is transudative lymphocytic predominant arguing against any infection.  [name] MD      NAME:  Curley Spice, MRN:  [id number], DOB:  [birth year], LOS: 9 ADMISSION DATE:  08/09/2020, CONSULTATION DATE:  9/22 REFERRING MD:  singh, CHIEF COMPLAINT:  Pleural effusion      History of present illness   This is a 74 year old male patient with history as mentioned below.  Admitted to the hospital on 9/16 with chief complaint of confusion, diarrhea, and dehydration.  Ultimately admitted with working diagnosis of what was felt to be a mix of both aspiration pneumonia, further complicated by incidental finding of positive Covid test of note he was fully vaccinated.  He was admitted to the internal medicine service therapeutic interventions have included IV antibiotics, IV hydration, and supportive care.  He was additionally started on IV steroids in remdesivir.  He did have an elevated D-dimer, had No clinical findings of pulmonary embolus but a CT of chest was obtained to further evaluate, this was negative for pulmonary embolus however it did show enlarging what appeared to be chronic right pleural effusion with pleural rind, and compressive atelectasis of the right middle and lower lobe.  During his hospitalization he has made slow but steady improvement shortness of breath is improved, hypoxia resolved, metabolic encephalopathy has resolved.  However because of his chronic right pleural effusion and concerned about this being related to recurrent aspiration pulmonary was asked to evaluate on 9/22   Past Medical History  Left SFA stent, chronic nonhealing left heel ulcer.  Being followed by vascular surgery HTN, iron def anemia, [name], B12, chronic diastolic HF, AS< DM right AKA Peripheral artery disease  Significant Hospital Events   9/16 admitted Consults:    Vascular surgery Pulmonary Procedures:    Significant Diagnostic Tests:  CT of chest:9/17 shows chronic dependent right pleural effusion with associated pleural thickening this is larger when comparing prior CT exam there is associated atelectasis involving the right middle and lower lobe worrisome for pneumonia.  Cannot exclude potential malignancy  Micro Data:  9/17 blood cultures x2: Negative  Antimicrobials:  unasyn 9/16>>>  Interim history/subjective:  Pulmonary enzymes instilled through chest tube today 08/18/2020 total 60 cc of flush with 10 cc clamped for 1 hour.  Objective   Blood pressure (!) 122/39, pulse 61, temperature 98.3 F (36.8 C), temperature source Oral, resp. rate 15, height 5\' 9"  (1.753 m), weight 62.9 kg, SpO2 98 %.        Intake/Output Summary (Last 24 hours) at 08/18/2020 1250 Last data filed at 08/18/2020 1100 Gross per 24 hour  Intake 780 ml  Output 745 ml  Net 35 ml   Filed Weights   08/10/20 2121 08/16/20 0455 08/17/20 0500  Weight: 61.6 kg 63.7 kg 62.9 kg    Examination: General: Elderly male no acute distress at rest  HEENT: MM pink/moist no JVD or lymphadenopathy is appreciated Neuro: Grossly intact follows commands  CV: Heart sounds are distant PULM: Decreased breath sounds in the bases right chest tube with 310 cc drainage GI: soft, bsx4 active  Extremities: warm/dry,  edema  Skin: no rashes or lesions   Resolved Hospital Problem list   Aspiration pneumonia Acute metabolic encephalopathy  Assessment & Plan:  Loculated right pleural effusion Recent aspiration pneumonia Resolved acute metabolic encephalopathy Resolved acute respiratory failure History  of aortic stenosis History of hypertension History of severe peripheral vascular disease in arterial ulceration involving the left foot CKD stage III Chronic iron deficiency anemia   Pulmonary problem list: Loculated right pleural effusion with pleural rind.  In setting of  what also appears to be recent aspiration pneumonia, although postobstructive atelectasis and associated chronic pleural effusion in the setting of malignancy also a consideration.  Plan Status post pigtail catheter placement right.  310 cc of drainage over the last 24 hours from right chest tube. Status post TPA x2 with last dose given today 08/18/2020 Check chest x-ray in a.m. Possible discontinuation of chest tube in a.m.       Best practice:  Per primary  Labs   CBC: Recent Labs  Lab 08/14/20 0859 08/15/20 1130 08/16/20 0908 08/17/20 0431 08/18/20 0409  WBC 12.7* 11.3* 13.3* 10.8* 12.0*  NEUTROABS 9.6* 8.0* 9.3* 7.2 7.8*  HGB 10.6* 8.3* 8.7* 8.6* 7.9*  HCT 32.1* 25.6* 27.0* 26.7* 24.2*  MCV 83.2 82.8 82.1 83.2 82.9  PLT 293 219 236 213 223    Basic Metabolic Panel: Recent Labs  Lab 08/14/20 0859 08/15/20 1130 08/16/20 0908 08/17/20 0431 08/18/20 0409  NA 138 137 139 136 137  K 3.3* 2.9* 3.3* 3.8 3.7  CL 103 104 106 106 102  CO2 24 25 24 25 26   GLUCOSE 155* 157* 117* 104* 122*  BUN 35* 29* 24* 19 21  CREATININE 1.37* 1.42* 1.28* 1.26* 1.28*  CALCIUM 8.9 8.0* 8.4* 8.1* 8.3*  MG 1.9 1.9 1.8 1.9 2.0  PHOS  --   --   --  2.3* 2.3*   GFR: Estimated Creatinine Clearance: 43.7 mL/min (A) (by C-G formula based on SCr of 1.28 mg/dL (H)). Recent Labs  Lab 08/12/20 1323 08/12/20 1323 08/13/20 0402 08/13/20 0402 08/14/20 0859 08/14/20 0859 08/15/20 1130 08/16/20 0908 08/17/20 0431 08/18/20 0409  PROCALCITON 0.58  --  0.44  --  0.15  --   --   --   --   --   WBC 15.7*   < > 11.0*   < > 12.7*   < > 11.3* 13.3* 10.8* 12.0*   < > = values in this interval not displayed.    Liver Function Tests: Recent Labs  Lab 08/12/20 1323 08/13/20 0402 08/14/20 0859 08/17/20 0431 08/18/20 0409  AST 33 25 22 14* 13*  ALT 30 29 35 19 19  ALKPHOS 71 72 77 64 69  BILITOT 0.4 0.5 0.5 0.4 0.4  PROT 7.1 7.2 7.7 6.0* 5.8*  ALBUMIN 2.1* 2.2* 2.6* 2.2* 2.1*   No results  for input(s): LIPASE, AMYLASE in the last 168 hours. No results for input(s): AMMONIA in the last 168 hours.  ABG    Component Value Date/Time   HCO3 26.1 08/10/2020 1507   TCO2 27 08/10/2020 1507   O2SAT 99.0 08/10/2020 1507     Coagulation Profile: No results for input(s): INR, PROTIME in the last 168 hours.  Cardiac Enzymes: No results for input(s): CKTOTAL, CKMB, CKMBINDEX, TROPONINI in the last 168 hours.  HbA1C: Hgb A1c MFr Bld  Date/Time Value Ref Range Status  08/10/2020 12:33 AM 6.0 (H) 4.8 - 5.6 % Final    Comment:    (NOTE) Pre diabetes:          5.7%-6.4%  Diabetes:              >6.4%  Glycemic control for   <7.0% adults with diabetes   06/18/2020 03:59 PM 6.1 (  H) 4.8 - 5.6 % Final    Comment:    (NOTE) Pre diabetes:          5.7%-6.4%  Diabetes:              >6.4%  Glycemic control for   <7.0% adults with diabetes     CBG: Recent Labs  Lab 08/17/20 1736 08/17/20 2103 08/18/20 0635 08/18/20 0736 08/18/20 1200  GLUCAP 142* 159* 113* 157* 198*    Steve Minor ACNP Acute Care Nurse Practitioner [facility] Pulmonary/Critical Care Please consult Amion 08/18/2020, 12:50 PM

## 2020-08-18 NOTE — Procedures (Signed)
Rt chest tube instillation.  30 ml cathflow activase 30 cc pulmozyme To rrrrrt chest tube. Clamp x 1 hour. Brett Canales Kayzen Kendzierski ACNP Acute Care Nurse Practitioner [facility] Please consult Amion 08/18/2020, 12:32 PM

## 2020-08-19 ENCOUNTER — Inpatient Hospital Stay (HOSPITAL_COMMUNITY): Payer: Medicare Other

## 2020-08-19 LAB — CBC WITH DIFFERENTIAL/PLATELET
Abs Immature Granulocytes: 0.06 10*3/uL (ref 0.00–0.07)
Basophils Absolute: 0 10*3/uL (ref 0.0–0.1)
Basophils Relative: 0 %
Eosinophils Absolute: 0.6 10*3/uL — ABNORMAL HIGH (ref 0.0–0.5)
Eosinophils Relative: 6 %
HCT: 21.5 % — ABNORMAL LOW (ref 39.0–52.0)
Hemoglobin: 7 g/dL — ABNORMAL LOW (ref 13.0–17.0)
Immature Granulocytes: 1 %
Lymphocytes Relative: 20 %
Lymphs Abs: 2.2 10*3/uL (ref 0.7–4.0)
MCH: 27.1 pg (ref 26.0–34.0)
MCHC: 32.6 g/dL (ref 30.0–36.0)
MCV: 83.3 fL (ref 80.0–100.0)
Monocytes Absolute: 1 10*3/uL (ref 0.1–1.0)
Monocytes Relative: 10 %
Neutro Abs: 7 10*3/uL (ref 1.7–7.7)
Neutrophils Relative %: 63 %
Platelets: 228 10*3/uL (ref 150–400)
RBC: 2.58 MIL/uL — ABNORMAL LOW (ref 4.22–5.81)
RDW: 14.3 % (ref 11.5–15.5)
WBC: 11 10*3/uL — ABNORMAL HIGH (ref 4.0–10.5)
nRBC: 0 % (ref 0.0–0.2)

## 2020-08-19 LAB — COMPREHENSIVE METABOLIC PANEL
ALT: 17 U/L (ref 0–44)
AST: 14 U/L — ABNORMAL LOW (ref 15–41)
Albumin: 2.1 g/dL — ABNORMAL LOW (ref 3.5–5.0)
Alkaline Phosphatase: 65 U/L (ref 38–126)
Anion gap: 10 (ref 5–15)
BUN: 21 mg/dL (ref 8–23)
CO2: 24 mmol/L (ref 22–32)
Calcium: 8.2 mg/dL — ABNORMAL LOW (ref 8.9–10.3)
Chloride: 102 mmol/L (ref 98–111)
Creatinine, Ser: 1.34 mg/dL — ABNORMAL HIGH (ref 0.61–1.24)
GFR calc Af Amer: 59 mL/min — ABNORMAL LOW (ref >60)
GFR calc non Af Amer: 51 mL/min — ABNORMAL LOW (ref >60)
Glucose, Bld: 178 mg/dL — ABNORMAL HIGH (ref 70–99)
Potassium: 3.9 mmol/L (ref 3.5–5.1)
Sodium: 136 mmol/L (ref 135–145)
Total Bilirubin: 0.6 mg/dL (ref 0.3–1.2)
Total Protein: 5.6 g/dL — ABNORMAL LOW (ref 6.5–8.1)

## 2020-08-19 LAB — PREPARE RBC (CROSSMATCH)

## 2020-08-19 LAB — C-REACTIVE PROTEIN: CRP: 0.9 mg/dL (ref <1.0)

## 2020-08-19 LAB — GLUCOSE, CAPILLARY
Glucose-Capillary: 137 mg/dL — ABNORMAL HIGH (ref 70–99)
Glucose-Capillary: 135 mg/dL — ABNORMAL HIGH (ref 70–99)
Glucose-Capillary: 181 mg/dL — ABNORMAL HIGH (ref 70–99)
Glucose-Capillary: 216 mg/dL — ABNORMAL HIGH (ref 70–99)

## 2020-08-19 LAB — PHOSPHORUS: Phosphorus: 2.2 mg/dL — ABNORMAL LOW (ref 2.5–4.6)

## 2020-08-19 LAB — D-DIMER, QUANTITATIVE: D-Dimer, Quant: 1.42 ug/mL-FEU — ABNORMAL HIGH (ref 0.00–0.50)

## 2020-08-19 LAB — FERRITIN: Ferritin: 256 ng/mL (ref 24–336)

## 2020-08-19 LAB — MAGNESIUM: Magnesium: 1.8 mg/dL (ref 1.7–2.4)

## 2020-08-19 MED ORDER — SODIUM CHLORIDE 0.9% IV SOLUTION: Freq: Once | INTRAVENOUS | Status: DC

## 2020-08-19 NOTE — Progress Notes (Signed)
NAME:  Jeremiah [name], MRN:  [id number], DOB:  1945-12-07, LOS: 10 ADMISSION DATE:  08/09/2020, CONSULTATION DATE:  9/22 REFERRING MD:  singh, CHIEF COMPLAINT:  Pleural effusion      History of present illness   This is a 74 year old male patient with history as mentioned below.  Admitted to the hospital on 9/16 with chief complaint of confusion, diarrhea, and dehydration.  Ultimately admitted with working diagnosis of what was felt to be a mix of both aspiration pneumonia, further complicated by incidental finding of positive Covid test of note he was fully vaccinated.  He was admitted to the internal medicine service therapeutic interventions have included IV antibiotics, IV hydration, and supportive care.  He was additionally started on IV steroids in remdesivir.  He did have an elevated D-dimer, had No clinical findings of pulmonary embolus but a CT of chest was obtained to further evaluate, this was negative for pulmonary embolus however it did show enlarging what appeared to be chronic right pleural effusion with pleural rind, and compressive atelectasis of the right middle and lower lobe.  During his hospitalization he has made slow but steady improvement shortness of breath is improved, hypoxia resolved, metabolic encephalopathy has resolved.  However because of his chronic right pleural effusion and concerned about this being related to recurrent aspiration pulmonary was asked to evaluate on 9/22   Past Medical History  Left SFA stent, chronic nonhealing left heel ulcer.  Being followed by vascular surgery HTN, iron def anemia, [name], B12, chronic diastolic HF, AS< DM right AKA Peripheral artery disease  Significant Hospital Events   9/16 admitted Consults:  Vascular surgery Pulmonary Procedures:    Significant Diagnostic Tests:  CT of chest:9/17 shows chronic dependent right pleural effusion with associated pleural thickening this is larger when comparing prior CT  exam there is associated atelectasis involving the right middle and lower lobe worrisome for pneumonia.  Cannot exclude potential malignancy  Micro Data:  9/17 blood cultures x2: Negative  Antimicrobials:  unasyn 9/16>>>  Interim history/subjective:  Pulmonary enzymes instilled through chest tube today 08/18/2020 total 60 cc of flush with 10 cc clamped for 1 hour.  Objective   Blood pressure (!) 128/52, pulse 64, temperature 98.5 F (36.9 C), temperature source Oral, resp. rate 20, height 5\' 9"  (1.753 m), weight 65.1 kg, SpO2 98 %.        Intake/Output Summary (Last 24 hours) at 08/19/2020 0914 Last data filed at 08/19/2020 08/21/2020 Gross per 24 hour  Intake 480 ml  Output 1140 ml  Net -660 ml   Filed Weights   08/16/20 0455 08/17/20 0500 08/19/20 0526  Weight: 63.7 kg 62.9 kg 65.1 kg    Examination: General: Elderly male eating in no acute distress HEENT: MM pink/moist no JVD is appreciated Neuro: Grossly intact without focal defect CV: Heart sounds are distant PULM: Diminished breath sounds in the bases right chest tube noted to have drain 720 cc of bloody secretions last 24 hours without air leak on Pleur-evvac  GI: soft, bsx4 active : Extremities: warm/dry,  edema  Skin: no rashes or lesions    Resolved Hospital Problem list   Aspiration pneumonia Acute metabolic encephalopathy   Assessment & Plan:  Loculated right pleural effusion Recent aspiration pneumonia Resolved acute metabolic encephalopathy Resolved acute respiratory failure History of aortic stenosis History of hypertension History of severe peripheral vascular disease in arterial ulceration involving the left foot CKD stage III Chronic iron deficiency anemia  Pulmonary problem list: Loculated right pleural effusion with pleural rind.  In setting of what also appears to be recent aspiration pneumonia, although postobstructive atelectasis and associated chronic pleural effusion in the setting of  malignancy also a consideration.  Plan 08/19/2020 I wrote an order to discontinue right chest tube. No drain 720 cc after instillation of Pulmozyme. Have ordered CT scan of chest to evaluate for malignancy.       Best practice:  Per primary  Labs   CBC: Recent Labs  Lab 08/14/20 0859 08/15/20 1130 08/16/20 0908 08/17/20 0431 08/18/20 0409  WBC 12.7* 11.3* 13.3* 10.8* 12.0*  NEUTROABS 9.6* 8.0* 9.3* 7.2 7.8*  HGB 10.6* 8.3* 8.7* 8.6* 7.9*  HCT 32.1* 25.6* 27.0* 26.7* 24.2*  MCV 83.2 82.8 82.1 83.2 82.9  PLT 293 219 236 213 223    Basic Metabolic Panel: Recent Labs  Lab 08/14/20 0859 08/15/20 1130 08/16/20 0908 08/17/20 0431 08/18/20 0409  NA 138 137 139 136 137  K 3.3* 2.9* 3.3* 3.8 3.7  CL 103 104 106 106 102  CO2 24 25 24 25 26   GLUCOSE 155* 157* 117* 104* 122*  BUN 35* 29* 24* 19 21  CREATININE 1.37* 1.42* 1.28* 1.26* 1.28*  CALCIUM 8.9 8.0* 8.4* 8.1* 8.3*  MG 1.9 1.9 1.8 1.9 2.0  PHOS  --   --   --  2.3* 2.3*   GFR: Estimated Creatinine Clearance: 45.2 mL/min (A) (by C-G formula based on SCr of 1.28 mg/dL (H)). Recent Labs  Lab 08/12/20 1323 08/12/20 1323 08/13/20 0402 08/13/20 0402 08/14/20 0859 08/14/20 0859 08/15/20 1130 08/16/20 0908 08/17/20 0431 08/18/20 0409  PROCALCITON 0.58  --  0.44  --  0.15  --   --   --   --   --   WBC 15.7*   < > 11.0*   < > 12.7*   < > 11.3* 13.3* 10.8* 12.0*   < > = values in this interval not displayed.    Liver Function Tests: Recent Labs  Lab 08/12/20 1323 08/13/20 0402 08/14/20 0859 08/17/20 0431 08/18/20 0409  AST 33 25 22 14* 13*  ALT 30 29 35 19 19  ALKPHOS 71 72 77 64 69  BILITOT 0.4 0.5 0.5 0.4 0.4  PROT 7.1 7.2 7.7 6.0* 5.8*  ALBUMIN 2.1* 2.2* 2.6* 2.2* 2.1*   No results for input(s): LIPASE, AMYLASE in the last 168 hours. No results for input(s): AMMONIA in the last 168 hours.  ABG    Component Value Date/Time   HCO3 26.1 08/10/2020 1507   TCO2 27 08/10/2020 1507   O2SAT 99.0  08/10/2020 1507     Coagulation Profile: No results for input(s): INR, PROTIME in the last 168 hours.  Cardiac Enzymes: No results for input(s): CKTOTAL, CKMB, CKMBINDEX, TROPONINI in the last 168 hours.  HbA1C: Hgb A1c MFr Bld  Date/Time Value Ref Range Status  08/10/2020 12:33 AM 6.0 (H) 4.8 - 5.6 % Final    Comment:    (NOTE) Pre diabetes:          5.7%-6.4%  Diabetes:              >6.4%  Glycemic control for   <7.0% adults with diabetes   06/18/2020 03:59 PM 6.1 (H) 4.8 - 5.6 % Final    Comment:    (NOTE) Pre diabetes:          5.7%-6.4%  Diabetes:              >6.4%  Glycemic control for   <7.0% adults with diabetes     CBG: Recent Labs  Lab 08/18/20 0736 08/18/20 1200 08/18/20 1615 08/18/20 2119 08/19/20 0811  GLUCAP 157* 198* 74 169* 137*    Steve Javarie Crisp ACNP Acute Care Nurse Practitioner [facility]Critical [facility] Please consult Amion 08/19/2020, 9:14 AM

## 2020-08-19 NOTE — Consult Note (Signed)
WOC Nurse Consult Note: Reason for Consult: Patient reconsulted for new area of concern, the sacrum is with both healed Stage 2 PI and partial thickness skin breakdown and surrounding MASD, specifically incontinence associated dermatitis Wound type: MASD Pressure Injury POA: Yes, Stage 2, now healed. Measurement: Area of MASD measures 6cm x 5.5cm x 0.1cm Wound OAC:ZYSA, dry Drainage (amount, consistency, odor) none Periwound:dry skin over previously healed areas of skin loss. Dressing procedure/placement/frequency: Patient's Bedside RN Darla Lesches) is able to used iPhone to obtain a photograph of the affected area on the sacrum and right buttocks but is not able to upload into EMR. She shows me photo and in collaborative consultation we are able to devise a POC for the area of concern consisting of placement of an antimicrobial nonadherent (xeroform) over the open area prior to topping with a silicone foam. Turning and repositioning is in place and will continue. The patient will now receive a pressure redistribution chair pad for his use while OOB in the chair. He already has Prevalon boots from the previous WOC nurse consultation on 9/17.  WOC nursing team will not follow, but will remain available to this patient, the nursing and medical teams.  Please re-consult if needed. Thanks, Ladona Mow, MSN, RN, GNP, [facility]  Pager# [phone]

## 2020-08-19 NOTE — Progress Notes (Signed)
Chest tube removed without distress. Will continue to monitor patient.

## 2020-08-19 NOTE — Progress Notes (Signed)
PROGRESS NOTE    Jeremiah [name]  [name] DOB: [birth year] DOA: 08/09/2020 PCP: Jeremiah Brod, MD   Chief Complaint  Patient presents with  . Code Sepsis    Brief Narrative:   [name], HTN, iron deficiency anemia, [name], [name], chronic diastolic CHF, aortic stenosis, [name], [name], [name], [name], apparently has had diarrhea for the last several weeks, upon arrival to the ER he was found to have possible COVID-19 pneumonia versus aspiration pneumonia along with dehydration and metabolic encephalopathy and he was admitted to the hospital.  He is fully vaccinated for COVID-19.  Assessment & Plan:   Active Problems:   PVD (peripheral vascular disease) (HCC)   Hypertension   HLD (hyperlipidemia)   Leukocytosis   S/P AKA (above knee amputation) (HCC)   Diabetes mellitus type 2 in nonobese (HCC)   Dyslipidemia   Type 2 diabetes mellitus with peripheral neuropathy (HCC)   Pneumonia due to COVID-19 virus   Acute metabolic encephalopathy   Dysphagia   Chest tube in place  Acute Hypoxic Respiratory Failure  Aspiration Pneumonia  Chronic Right Pleural Effusion, exudative  COVID 19 infection Vaccinated CXR 9/22 with loculated R effusion CT 9/17 with enlarging chronic R effusion and patchy R upper lobe airspace disease Currently on RA, had mild hypoxia earlier in hospitalization Per previous provider, COVID 19 infection was not thought to be primary driver of his illness.  S/p 5 days of remdesivir. Being treated for aspiration pneumonia with unasyn - continue additional 7 days augmentin per pulm Pulm c/s for chronic pulm effusion, appreciate assistance Blood and urine cx NG.  MRSA PCR negative. CXR 9/24 with pigtail catheter on R, loculated pleural effusion with patchy atelectasis/infiltrate on R CXR 9/26 with visualized portion of loculated R  effusion now mainly contains gas.  R pigtail thoracostomy tube.   TPA x 2 per pulmonary S/p pigtail 9/23, follow thora labs - exudative effusion by light's - cytology (no malgnant cells) and culture (no growth) Removed 9/26 Follow CT chest  COVID-19 Labs  Recent Labs    08/16/20 2057 08/17/20 0431 08/18/20 0409 08/19/20 0842  DDIMER  --  1.95* 1.97* 1.42*  FERRITIN  --  293 258 256  LDH 134  --   --   --   CRP  --  1.5* 1.0* 0.9    Lab Results  Component Value Date   SARSCOV2NAA POSITIVE (Jeremiah [name]) 08/09/2020   SARSCOV2NAA NEGATIVE 06/25/2020   SARSCOV2NAA NEGATIVE 06/18/2020   SARSCOV2NAA NEGATIVE 04/18/2020   PAD with R AKA, recent L. SMA stent with Chronic left heel arterial insufficiency ulcer present on admission.  Wound care on board, continue on dual antiplatelet therapy along with statin for secondary prevention, he follows with Dr. Randie [name], wound care has been consulted, seen by vascular surgery for now wound actually now seems to be improving per vascular surgery, continue supportive care.  Case discussed with Dr. Randie [name], follow-up with him post discharge in 1 to 2 weeks.   CT PE with possible small linea filling defect that could relate to previous PE, unclear significance, no acute PE, will continue to monitor  4. Acute Metabolic encephalopathy upon admission. Likely 2/2 aspiration pneumonia. Now improved.  6.  History of aortic stenosis at least moderate per recent echocardiogram.  Outpatient cardiology follow-up.  Aortic valve area is 1.34 cm and V-max is 3.27 m/s.  Continue beta-blocker.  Avoid sudden preload reduction.   7.  Essential hypertension.  On beta-blocker, dose decreased as he was slightly bradycardic (holding parameters), added Norvasc as well for better control.  8.  Dehydration due to diarrhea.  Diarrhea seems to have resolved. Improved after IV fluids.  9.  CKD 3A.  Baseline creatinine around 1.4.  Monitor.  10. Chronic iron deficiency anemia   Vitamin [name] Continue iron supplementation Start B12 supplementation based on August labs Repeat labs - normal iron, b12 folate Hb downtrending to 7 today, transfuse 1 unit pRBC  11.  Elevated D-dimer.  Likely due to infection and inflammation, negative lower extremity ultrasound.  No clinical signs or symptoms of PE.  Most likely D-dimer high due to inflammation from aspiration pneumonia.  12.  Hypokalemia.  Replaced.    13. DM type II.  On sliding scale.  Pressure Ulcer Frequent turns, foam Pressure Injury 08/10/20 Sacrum Posterior Stage 2 -  Partial thickness loss of dermis presenting as Jeremiah [name] shallow open injury with Jeremiah [name] red, pink wound bed without slough. pink - Quarter size and dime sized areas (Active)  08/10/20 2145  Location: Sacrum  Location Orientation: Posterior  Staging: Stage 2 -  Partial thickness loss of dermis presenting as Jeremiah [name] shallow open injury with Jeremiah [name] red, pink wound bed without slough.  Wound Description (Comments): pink - Quarter size and dime sized areas  Present on Admission: No    DVT prophylaxis: lovenox Code Status: full Family Communication: wife 9/25 Disposition:   Status is: Inpatient  Remains inpatient appropriate because:Inpatient level of care appropriate due to severity of illness   Dispo: The patient is from: Home              Anticipated d/c is to: SNF              Anticipated d/c date is: > 3 days              Patient currently is not medically stable to d/c.       Consultants:   Pulm  Procedures:  LE US Summary:  RIGHT:  - There is no evidence of deep vein thrombosis in the lower extremity.    LEFT:  - There is no evidence of deep vein thrombosis in the lower extremity.   Antimicrobials: Anti-infectives (From admission, onward)   Start     Dose/Rate Route Frequency Ordered Stop   08/17/20 2230  amoxicillin-clavulanate (AUGMENTIN) 875-125 MG per tablet 1 tablet        1 tablet Oral Every 12 hours 08/17/20 1636  08/24/20 2159   08/11/20 1000  remdesivir 100 mg in sodium chloride 0.9 % 100 mL IVPB       "Followed by" Linked Group Details   100 mg 200 mL/hr over 30 Minutes Intravenous Daily 08/09/20 2256 08/14/20 0955   08/10/20 2000  azithromycin (ZITHROMAX) 500 mg in sodium chloride 0.9 % 250 mL IVPB  Status:  Discontinued        500 mg 250 mL/hr over 60 Minutes Intravenous Every 24 hours 08/09/20 2256 08/12/20 1042   08/10/20 1800  cefTRIAXone (ROCEPHIN) 2 g in sodium chloride 0.9 % 100 mL IVPB  Status:  Discontinued        2 g 200 mL/hr over 30 Minutes Intravenous Every 24 hours 08/09/20 2256 08/09/20 2332   08/10/20 0015  Ampicillin-Sulbactam (UNASYN) 3 g in sodium chloride 0.9 % 100 mL IVPB        3 g 200 mL/hr over 30  Minutes Intravenous Every 8 hours 08/10/20 0002 08/16/20 1706   08/10/20 0000  remdesivir 200 mg in sodium chloride 0.9% 250 mL IVPB       "Followed by" Linked Group Details   200 mg 580 mL/hr over 30 Minutes Intravenous Once 08/09/20 2256 08/10/20 0250   08/09/20 2030  cefTRIAXone (ROCEPHIN) 1 g in sodium chloride 0.9 % 100 mL IVPB        1 g 200 mL/hr over 30 Minutes Intravenous  Once 08/09/20 2020 08/09/20 2139   08/09/20 2030  azithromycin (ZITHROMAX) 500 mg in sodium chloride 0.9 % 250 mL IVPB        500 mg 250 mL/hr over 60 Minutes Intravenous  Once 08/09/20 2020 08/09/20 2140      Subjective: No complaints today Asking about eventual d/c  Objective: Vitals:   08/19/20 0400 08/19/20 0500 08/19/20 0526 08/19/20 0741  BP: (!) 168/61   (!) 128/52  Pulse: 64 64    Resp: 19 14  20   Temp: 98.6 F (37 C)   98.5 F (36.9 C)  TempSrc: Oral   Oral  SpO2: 98% 98%    Weight:   65.1 kg   Height:        Intake/Output Summary (Last 24 hours) at 08/19/2020 1415 Last data filed at 08/19/2020 1150 Gross per 24 hour  Intake 480 ml  Output 890 ml  Net -410 ml   Filed Weights   08/16/20 0455 08/17/20 0500 08/19/20 0526  Weight: 63.7 kg 62.9 kg 65.1 kg     Examination:  General: No acute distress. Cardiovascular: Heart sounds show Haze Antillon regular rate, and rhythm.  Lungs: Clear to auscultation bilaterally Abdomen: Soft, nontender, nondistended Neurological: Alert and oriented 3. Moves all extremities 4. Cranial nerves II through XII grossly intact. Skin: Warm and dry. No rashes or lesions. Extremities: No clubbing or cyanosis. No edema.   Data Reviewed: I have personally reviewed following labs and imaging studies  CBC: Recent Labs  Lab 08/15/20 1130 08/16/20 0908 08/17/20 0431 08/18/20 0409 08/19/20 0842  WBC 11.3* 13.3* 10.8* 12.0* 11.0*  NEUTROABS 8.0* 9.3* 7.2 7.8* 7.0  HGB 8.3* 8.7* 8.6* 7.9* 7.0*  HCT 25.6* 27.0* 26.7* 24.2* 21.5*  MCV 82.8 82.1 83.2 82.9 83.3  PLT 219 236 213 223 228    Basic Metabolic Panel: Recent Labs  Lab 08/15/20 1130 08/16/20 0908 08/17/20 0431 08/18/20 0409 08/19/20 0842  NA 137 139 136 137 136  K 2.9* 3.3* 3.8 3.7 3.9  CL 104 106 106 102 102  CO2 25 24 25 26 24   GLUCOSE 157* 117* 104* 122* 178*  BUN 29* 24* 19 21 21   CREATININE 1.42* 1.28* 1.26* 1.28* 1.34*  CALCIUM 8.0* 8.4* 8.1* 8.3* 8.2*  MG 1.9 1.8 1.9 2.0 1.8  PHOS  --   --  2.3* 2.3* 2.2*    GFR: Estimated Creatinine Clearance: 43.2 mL/min (Cecila Satcher) (by C-G formula based on SCr of 1.34 mg/dL (H)).  Liver Function Tests: Recent Labs  Lab 08/13/20 0402 08/14/20 0859 08/17/20 0431 08/18/20 0409 08/19/20 0842  AST 25 22 14* 13* 14*  ALT 29 35 19 19 17   ALKPHOS 72 77 64 69 65  BILITOT 0.5 0.5 0.4 0.4 0.6  PROT 7.2 7.7 6.0* 5.8* 5.6*  ALBUMIN 2.2* 2.6* 2.2* 2.1* 2.1*    CBG: Recent Labs  Lab 08/18/20 1200 08/18/20 1615 08/18/20 2119 08/19/20 0811 08/19/20 1108  GLUCAP 198* 74 169* 137* 135*     Recent Results (from the  past 240 hour(s))  Blood culture (routine single)     Status: None   Collection Time: 08/09/20  7:28 PM   Specimen: BLOOD  Result Value Ref Range Status   Specimen Description BLOOD RIGHT  ANTECUBITAL  Final   Special Requests   Final    BOTTLES DRAWN AEROBIC ONLY Blood Culture results may not be optimal due to an inadequate volume of blood received in culture bottles   Culture   Final    NO GROWTH 5 DAYS Performed at [facility] Lab, [address] [place], [place] [address]    Report Status 08/14/2020 FINAL  Final  SARS Coronavirus 2 by RT PCR (hospital order, performed in [facility] hospital lab) Nasopharyngeal Nasopharyngeal Swab     Status: Abnormal   Collection Time: 08/09/20  7:56 PM   Specimen: Nasopharyngeal Swab  Result Value Ref Range Status   SARS Coronavirus 2 POSITIVE (Miia Blanks) NEGATIVE Final    Comment: RESULT CALLED TO, READ BACK BY AND VERIFIED WITH: B,OSORIO @2154  08/09/20 EB (NOTE) SARS-CoV-2 target nucleic acids are DETECTED  SARS-CoV-2 RNA is generally detectable in upper respiratory specimens  during the acute phase of infection.  Positive results are indicative  of the presence of the identified virus, but do not rule out bacterial infection or co-infection with other pathogens not detected by the test.  Clinical correlation with patient history and  other diagnostic information is necessary to determine patient infection status.  The expected result is negative.  Fact Sheet for Patients:   08/11/20   Fact Sheet for Healthcare Providers:   BoilerBrush.com.cy    This test is not yet approved or cleared by the https://pope.com/ FDA and  has been authorized for detection and/or diagnosis of SARS-CoV-2 by FDA under an Emergency Use Authorization (EUA).  This EUA will remain in effect (meaning this test can be  used) for the duration of  the COVID-19 declaration under Section 564(b)(1) of the Act, 21 U.S.C. section 360-bbb-3(b)(1), unless the authorization is terminated or revoked sooner.  Performed at [facility] Lab, [address], [place], [place]   Urine culture     Status:  None   Collection Time: 08/09/20 10:26 PM   Specimen: In/Out Cath Urine  Result Value Ref Range Status   Specimen Description IN/OUT CATH URINE  Final   Special Requests NONE  Final   Culture   Final    NO GROWTH Performed at [facility] Lab, [address], [place], [place]    Report Status 08/10/2020 FINAL  Final  Culture, blood (Routine X 2) w Reflex to ID Panel     Status: None   Collection Time: 08/10/20  3:18 AM   Specimen: BLOOD LEFT HAND  Result Value Ref Range Status   Specimen Description BLOOD LEFT HAND  Final   Special Requests   Final    BOTTLES DRAWN AEROBIC AND ANAEROBIC Blood Culture adequate volume   Culture   Final    NO GROWTH 5 DAYS Performed at [facility] Lab, [address] [place], [place]    Report Status 08/15/2020 FINAL  Final  Culture, blood (Routine X 2) w Reflex to ID Panel     Status: None   Collection Time: 08/10/20  2:53 PM   Specimen: BLOOD  Result Value Ref Range Status   Specimen Description BLOOD SITE NOT SPECIFIED  Final   Special Requests   Final    BOTTLES DRAWN AEROBIC  AND ANAEROBIC Blood Culture adequate volume   Culture   Final    NO GROWTH 5 DAYS Performed at [facility] Lab, [address] [place], [place] [address]    Report Status 08/15/2020 FINAL  Final  MRSA PCR Screening     Status: None   Collection Time: 08/10/20 10:00 PM   Specimen: Nasal Mucosa; Nasopharyngeal  Result Value Ref Range Status   MRSA by PCR NEGATIVE NEGATIVE Final    Comment:        The GeneXpert MRSA Assay (FDA approved for NASAL specimens only), is one component of Corwin Kuiken comprehensive MRSA colonization surveillance program. It is not intended to diagnose MRSA infection nor to guide or monitor treatment for MRSA infections. Performed at [facility] Lab, [address] [place], [place] [address]   Body fluid culture (includes gram stain)     Status: None (Preliminary result)   Collection Time: 08/16/20  2:07 PM    Specimen: Pleural Fluid  Result Value Ref Range Status   Specimen Description FLUID  Final   Special Requests NONE  Final   Gram Stain   Final    RARE WBC PRESENT,BOTH PMN AND MONONUCLEAR NO ORGANISMS SEEN CYTOSPIN SMEAR    Culture   Final    NO GROWTH 3 DAYS Performed at [facility] Lab, [address], [place], [place] [address]    Report Status PENDING  Incomplete         Radiology Studies: DG Chest Port 1 View  Result Date: 08/19/2020 CLINICAL DATA:  Follow-up chest tube for loculated RIGHT pleural effusion. EXAM: PORTABLE CHEST 1 VIEW COMPARISON:  08/17/2020 FINDINGS: Brain Honeycutt RIGHT pigtail thoracostomy tube is noted. Visualized portion of the loculated RIGHT pleural effusion now mainly contains gas. Bibasilar atelectasis is present. No other significant change identified. IMPRESSION: Visualized portion of the loculated RIGHT pleural effusion now mainly contains gas. RIGHT pigtail thoracostomy tube again noted. Bibasilar atelectasis. Electronically Signed   By: Harmon Pier M.D.   On: 08/19/2020 08:06        Scheduled Meds: . amLODipine  10 mg Oral Daily  . amoxicillin-clavulanate  1 tablet Oral Q12H  . vitamin C  500 mg Oral Daily  . aspirin  81 mg Oral Daily  . atorvastatin  20 mg Oral Daily  . brimonidine  1 drop Both Eyes BID  . enoxaparin (LOVENOX) injection  40 mg Subcutaneous Q24H  . feeding supplement (ENSURE ENLIVE)  237 mL Oral TID BM  . ferrous sulfate  325 mg Oral BID WC  . hydrALAZINE  50 mg Oral Q8H  . insulin aspart  0-9 Units Subcutaneous TID WC  . metoprolol succinate  12.5 mg Oral Daily  . pantoprazole  40 mg Oral Daily  . sodium chloride flush  10 mL Intracatheter Q8H  . sodium chloride flush  3 mL Intravenous Q12H  . timolol  1 drop Both Eyes BID  . vitamin B-12  1,000 mcg Oral Daily  . zinc sulfate  220 mg Oral Daily   Continuous Infusions:    LOS: 10 days    Time spent: over 30 min    Lacretia Nicks, MD [facility]   To  contact the attending provider between 7A-7P or the covering provider during after hours 7P-7A, please log into the web site [email or web address] and access using universal [place] password for that web site. If you do not have the password, please call the hospital operator.  08/19/2020, 2:15 PM

## 2020-08-20 ENCOUNTER — Inpatient Hospital Stay (HOSPITAL_COMMUNITY): Payer: Medicare Other

## 2020-08-20 LAB — CBC WITH DIFFERENTIAL/PLATELET
Abs Immature Granulocytes: 0.12 10*3/uL — ABNORMAL HIGH (ref 0.00–0.07)
Basophils Absolute: 0.1 10*3/uL (ref 0.0–0.1)
Basophils Relative: 0 %
Eosinophils Absolute: 0.9 10*3/uL — ABNORMAL HIGH (ref 0.0–0.5)
Eosinophils Relative: 6 %
HCT: 23.8 % — ABNORMAL LOW (ref 39.0–52.0)
Hemoglobin: 7.8 g/dL — ABNORMAL LOW (ref 13.0–17.0)
Immature Granulocytes: 1 %
Lymphocytes Relative: 19 %
Lymphs Abs: 2.6 10*3/uL (ref 0.7–4.0)
MCH: 27.5 pg (ref 26.0–34.0)
MCHC: 32.8 g/dL (ref 30.0–36.0)
MCV: 83.8 fL (ref 80.0–100.0)
Monocytes Absolute: 1.4 10*3/uL — ABNORMAL HIGH (ref 0.1–1.0)
Monocytes Relative: 10 %
Neutro Abs: 8.6 10*3/uL — ABNORMAL HIGH (ref 1.7–7.7)
Neutrophils Relative %: 64 %
Platelets: 224 10*3/uL (ref 150–400)
RBC: 2.84 MIL/uL — ABNORMAL LOW (ref 4.22–5.81)
RDW: 14.5 % (ref 11.5–15.5)
WBC: 13.6 10*3/uL — ABNORMAL HIGH (ref 4.0–10.5)
nRBC: 0 % (ref 0.0–0.2)

## 2020-08-20 LAB — TYPE AND SCREEN
ABO/RH(D): B POS
Antibody Screen: NEGATIVE
Unit division: 0

## 2020-08-20 LAB — COMPREHENSIVE METABOLIC PANEL
ALT: 17 U/L (ref 0–44)
AST: 16 U/L (ref 15–41)
Albumin: 2.3 g/dL — ABNORMAL LOW (ref 3.5–5.0)
Alkaline Phosphatase: 70 U/L (ref 38–126)
Anion gap: 9 (ref 5–15)
BUN: 20 mg/dL (ref 8–23)
CO2: 27 mmol/L (ref 22–32)
Calcium: 8.2 mg/dL — ABNORMAL LOW (ref 8.9–10.3)
Chloride: 102 mmol/L (ref 98–111)
Creatinine, Ser: 1.25 mg/dL — ABNORMAL HIGH (ref 0.61–1.24)
GFR calc Af Amer: >60 mL/min (ref >60)
GFR calc non Af Amer: 56 mL/min — ABNORMAL LOW (ref >60)
Glucose, Bld: 133 mg/dL — ABNORMAL HIGH (ref 70–99)
Potassium: 4 mmol/L (ref 3.5–5.1)
Sodium: 138 mmol/L (ref 135–145)
Total Bilirubin: 0.5 mg/dL (ref 0.3–1.2)
Total Protein: 5.8 g/dL — ABNORMAL LOW (ref 6.5–8.1)

## 2020-08-20 LAB — MAGNESIUM: Magnesium: 1.9 mg/dL (ref 1.7–2.4)

## 2020-08-20 LAB — FERRITIN: Ferritin: 257 ng/mL (ref 24–336)

## 2020-08-20 LAB — C-REACTIVE PROTEIN: CRP: 0.8 mg/dL (ref <1.0)

## 2020-08-20 LAB — GLUCOSE, CAPILLARY
Glucose-Capillary: 163 mg/dL — ABNORMAL HIGH (ref 70–99)
Glucose-Capillary: 199 mg/dL — ABNORMAL HIGH (ref 70–99)
Glucose-Capillary: 133 mg/dL — ABNORMAL HIGH (ref 70–99)
Glucose-Capillary: 105 mg/dL — ABNORMAL HIGH (ref 70–99)

## 2020-08-20 LAB — BPAM RBC
Blood Product Expiration Date: 202110202359
ISSUE DATE / TIME: 202109261700
Unit Type and Rh: 7300

## 2020-08-20 LAB — BODY FLUID CULTURE: Culture: NO GROWTH

## 2020-08-20 LAB — PHOSPHORUS: Phosphorus: 2.1 mg/dL — ABNORMAL LOW (ref 2.5–4.6)

## 2020-08-20 LAB — D-DIMER, QUANTITATIVE: D-Dimer, Quant: 2.17 ug/mL-FEU — ABNORMAL HIGH (ref 0.00–0.50)

## 2020-08-20 NOTE — TOC Progression Note (Signed)
Transition of Care [facility]) - Progression Note    Patient Details  Name: Jeremiah [name] MRN: [id number] Date of Birth: [birth year]  Transition of Care [facility]) CM/SW Contact  Janae Bridgeman, RN Phone Number: 08/20/2020, 1:13 PM  Clinical Narrative:    Case management spoke with Everardo Pacific, CM at [facility] and they have an available SNf bed for the patient.  The patient's chest tube was discontinued today and he has pending lab results to return today after receiving packed blood cells yesterday. I left a message with Everardo Pacific and they are holding a bed for the patient today but may not have an available bed for the patient tomorrow.  I will try and transfer the patient to [facility] if his lab results are returned.   Expected Discharge Plan: Home w Home Health Services Barriers to Discharge: Continued Medical Work up  Expected Discharge Plan and Services Expected Discharge Plan: Home w Home Health Services In-house Referral: Clinical Social Work     Living arrangements for the past 2 months: Single Family Home                   Social Determinants of Health (SDOH) Interventions    Readmission Risk Interventions Readmission Risk Prevention Plan 08/13/2020 07/08/2018 07/08/2018  Transportation Screening Complete - Complete  Medication Review Oceanographer) Referral to Pharmacy - Complete  PCP or Specialist appointment within 3-5 days of discharge Complete - Not Complete  HRI or Home Care Consult Complete (No Data) Complete  SW Recovery Care/Counseling Consult Complete Patient refused Not Complete  Palliative Care Screening Not Applicable - Not Complete  Comments - - (No Data)  Medication Reconcilation (Pharmacy) - - Not Complete  Skilled Nursing Facility Complete - Patient refused  Some recent data might be hidden

## 2020-08-20 NOTE — Progress Notes (Addendum)
Physical Therapy Treatment Patient Details Name: Jeremiah [name] MRN: [id number] DOB: 1945/12/12 Today's Date: 08/20/2020    History of Present Illness 74 yo male with L foot pain was found to be Covid positive.  Pt had L foot revascularization done 06/20/20.  Note a heel wound as well on LLE.  PMHx:  R AKA, DM, PVD, HTN, atherosclerosis, C-diff, gout, PNA, sleep apnea,     PT Comments    Pt supine on arrival, agreeable to therapy session with max encouragement, with fair participation and tolerance for session. Pt bed again soaked in urine on staff arrival to room, pt rolled to L/R sides x2 reps with minA and pt dependent for hygiene assist (nsg present to assist with cleanup). Pt performed supine to seated transition with modA and tolerated sitting upright ~3 minutes prior to returning to supine, reporting mild fatigue but no dizziness, not agreeable to attempt bed to chair transfer this session, he was perseverating on likely disposition to SNF once labs taken. Pt continues to benefit from PT services to progress toward functional mobility goals. Continue to recommend SNF level of rehab.   Follow Up Recommendations        Equipment Recommendations  None recommended by PT (defer to next facility)    Recommendations for Other Services       Precautions / Restrictions Precautions Precautions: Fall Precaution Comments: R AKA Required Braces or Orthoses: Other Brace Other Brace: RLE prosthesis Restrictions Weight Bearing Restrictions: No Other Position/Activity Restrictions: LLE heel pressure sore, deferred weight bearing on LLE 2/2 sore    Mobility  Bed Mobility Overal bed mobility: Needs Assistance Bed Mobility: Rolling Rolling: Min assist   Supine to sit: Mod assist Sit to supine: Mod assist   General bed mobility comments: increased time to perform, use of bed rail, modA needed for trunk rise to reach upright posture seated EOB  Transfers                 Lateral/Scoot Transfers: Min assist General transfer comment: minA for lateral seated scoot toward HOB (deferred OOB 2/2 pt awaiting lab to draw blood for potential discharge to SNF, pt refusing chair)  Ambulation/Gait                 Stairs             Wheelchair Mobility    Modified Rankin (Stroke Patients Only)       Balance Overall balance assessment: Needs assistance Sitting-balance support: Bilateral upper extremity supported;Feet unsupported Sitting balance-Leahy Scale: Fair Sitting balance - Comments: pt tolerated upright seated posture ~3 minutes only, needing heavy encouragement to stay seated for this amount of time.  Postural control: Posterior lean             Cognition Arousal/Alertness: Awake/alert Behavior During Therapy: WFL for tasks assessed/performed Overall Cognitive Status: Within Functional Limits for tasks assessed Area of Impairment: Awareness;Problem solving;Following commands;Safety/judgement                       Following Commands: Follows one step commands with increased time Safety/Judgement: Decreased awareness of safety;Decreased awareness of deficits   Problem Solving: Slow processing;Difficulty sequencing;Requires verbal cues;Decreased initiation;Requires tactile cues General Comments: pt anxious and hesitant to perform functional mobility tasks, needs heavy encouragement      Exercises Other Exercises Other Exercises: LLE AROM: SAQ, heel slides, hip abduction 1x10 reps ea; pt refused other exercises    General Comments General comments (skin integrity, edema, etc.):  sacral pressure sore, covered by mepilex, heel pressure sore (Prevalon boot donned before/after session)      Pertinent Vitals/Pain Pain Assessment: Faces Faces Pain Scale: Hurts little more Pain Location: area where chest tube recently removed Pain Descriptors / Indicators: Grimacing;Guarding Pain Intervention(s): Monitored during  session;Repositioned;Limited activity within patient's tolerance   Vitals:   08/20/20 1538  BP: (!) 161/53  Pulse: 78  Resp: 17  Temp: 98.3 F (36.8 C)  SpO2: 97% on RA          PT Goals (current goals can now be found in the care plan section) Acute Rehab PT Goals Patient Stated Goal: to get stronger and have enough help PT Goal Formulation: With patient Time For Goal Achievement: 08/24/20 Potential to Achieve Goals: Fair Progress towards PT goals: Progressing toward goals (slow progress toward goals)    Frequency    Min 3X/week      PT Plan Current plan remains appropriate    Co-evaluation              AM-PAC PT "6 Clicks" Mobility   Outcome Measure  Help needed turning from your back to your side while in a flat bed without using bedrails?: A Lot Help needed moving from lying on your back to sitting on the side of a flat bed without using bedrails?: A Lot Help needed moving to and from a bed to a chair (including a wheelchair)?: Total Help needed standing up from a chair using your arms (e.g., wheelchair or bedside chair)?: Total Help needed to walk in hospital room?: Total Help needed climbing 3-5 steps with a railing? : Total 6 Click Score: 8    End of Session Equipment Utilized During Treatment: Other (comment) (transfer pad) Activity Tolerance: Patient limited by fatigue Patient left: in bed;with bed alarm set;with nursing/sitter in room Nurse Communication: Mobility status PT Visit Diagnosis: Other abnormalities of gait and mobility (R26.89);Muscle weakness (generalized) (M62.81);Difficulty in walking, not elsewhere classified (R26.2);Adult, failure to thrive (R62.7)     Time: 0350-0938 PT Time Calculation (min) (ACUTE ONLY): 28 min  Charges:  $Therapeutic Activity: 23-37 mins                     Taniqua Issa P., PTA Acute Rehabilitation Services Pager: [phone] Office: [phone]   Angus Palms 08/20/2020, 4:24 PM

## 2020-08-20 NOTE — Progress Notes (Addendum)
PROGRESS NOTE    Jeremiah [name]  QPY:[id number] DOB: [birth year] DOA: 08/09/2020 PCP: Annita Brod, MD   Chief Complaint  Patient presents with  . Code Sepsis    Brief Narrative:   [name], HTN, iron deficiency anemia, [name], [name], chronic diastolic CHF, aortic stenosis, [name], [name], [name], [name], apparently has had diarrhea for the last several weeks, upon arrival to the ER he was found to have possible COVID-19 pneumonia versus aspiration pneumonia along with dehydration and metabolic encephalopathy and he was admitted to the hospital.  He is fully vaccinated for COVID-19.  Assessment & Plan:   Active Problems:   PVD (peripheral vascular disease) (HCC)   Hypertension   HLD (hyperlipidemia)   Leukocytosis   S/P AKA (above knee amputation) (HCC)   Diabetes mellitus type 2 in nonobese (HCC)   Dyslipidemia   Type 2 diabetes mellitus with peripheral neuropathy (HCC)   Pneumonia due to COVID-19 virus   Acute metabolic encephalopathy   Dysphagia   Chest tube in place  Acute Hypoxic Respiratory Failure  Aspiration Pneumonia  Chronic Right Pleural Effusion, exudative  COVID 19 infection Vaccinated CXR 9/22 with loculated R effusion CT 9/17 with enlarging chronic R effusion and patchy R upper lobe airspace disease Currently on RA, had mild hypoxia earlier in hospitalization Per previous provider, COVID 19 infection was not thought to be primary driver of his illness.  S/p 5 days of remdesivir.  He'll need to quarantine until Oct 7th. Being treated for aspiration pneumonia with unasyn - ok to d/c abx per pulm Pulm c/s for chronic pulm effusion, appreciate assistance Blood and urine cx NG.  MRSA PCR negative. CXR 9/24 with pigtail catheter on R, loculated pleural effusion with patchy atelectasis/infiltrate on R CXR 9/26 with visualized portion  of loculated R effusion now mainly contains gas.  R pigtail thoracostomy tube.   CT 9/26 chest with interval removal of R sided drainage catheter, reduction in fluid when compared to previous CT.  The space is now occupied predominately by air with Jeremiah Tarpley small amount of complex fluid identified.  Persistent, but improved consolidation of R lower lobe. CXR 9/27 with right base pleural air collection and progressive R base atelectasis/consolidation TPA x 2 per pulmonary S/p pigtail 9/23, follow thora labs - exudative effusion by light's - cytology (no malgnant cells) and culture (no growth) Appreciate pulm assistance, ok to d/c abx, chest tube, and no f/u needed per pulm   COVID-19 Labs  Recent Labs    08/18/20 0409 08/19/20 0842  DDIMER 1.97* 1.42*  FERRITIN 258 256  CRP 1.0* 0.9    Lab Results  Component Value Date   SARSCOV2NAA POSITIVE (Maciej Schweitzer) 08/09/2020   SARSCOV2NAA NEGATIVE 06/25/2020   SARSCOV2NAA NEGATIVE 06/18/2020   SARSCOV2NAA NEGATIVE 04/18/2020   PAD with R AKA, recent L. SMA stent with Chronic left heel arterial insufficiency ulcer present on admission.  Wound care on board, continue on dual antiplatelet therapy along with statin for secondary prevention, he follows with Dr. [name], wound care has been consulted, seen by vascular surgery for now wound actually now seems to be improving per vascular surgery, continue supportive care.  Case discussed with Dr. [name], follow-up with him post discharge in 1 to 2 weeks.   CT PE with possible small linea filling defect that could relate to previous PE, unclear significance, no acute PE, will  continue to monitor - negative LE [place] for DVT  4. Acute Metabolic encephalopathy upon admission. Likely 2/2 aspiration pneumonia. Now improved.  6.  History of aortic stenosis at least moderate per recent echocardiogram.  Outpatient cardiology follow-up.  Aortic valve area is 1.34 cm and V-max is 3.27 m/s.  Continue beta-blocker.  Avoid sudden  preload reduction.   7.  Essential hypertension.  On beta-blocker, dose decreased as he was slightly bradycardic (holding parameters), added Norvasc as well for better control.  8.  Dehydration due to diarrhea.  Diarrhea seems to have resolved. Improved after IV fluids.  9.  CKD 3A.  Baseline creatinine around 1.4.  Monitor.  10. Chronic iron deficiency anemia  Vitamin [name] Continue iron supplementation Start B12 supplementation based on August labs Repeat labs - normal iron, b12 folate Hb downtrending to 7 9/26, s/p 1 unit pRBC  Awaiting Hb today  11.  Elevated D-dimer.  Likely due to infection and inflammation, negative lower extremity ultrasound.  No clinical signs or symptoms of PE.  Most likely D-dimer high due to inflammation from aspiration pneumonia.  12.  Hypokalemia.  Replaced.    13. DM type II.  On sliding scale.  Pressure Ulcer Frequent turns, foam Wound care per wound c/s Pressure Injury 08/10/20 Sacrum Posterior Stage 2 -  Partial thickness loss of dermis presenting as Harley Mccartney shallow open injury with Rachid Parham red, pink wound bed without slough. pink - Quarter size and dime sized areas (Active)  08/10/20 2145  Location: Sacrum  Location Orientation: Posterior  Staging: Stage 2 -  Partial thickness loss of dermis presenting as Mindy Behnken shallow open injury with Jerelene Salaam red, pink wound bed without slough.  Wound Description (Comments): pink - Quarter size and dime sized areas  Present on Admission: No    DVT prophylaxis: lovenox Code Status: full Family Communication: wife 9/27 Disposition:   Status is: Inpatient  Remains inpatient appropriate because:Inpatient level of care appropriate due to severity of illness   Dispo: The patient is from: Home              Anticipated d/c is to: SNF              Anticipated d/c date is: > 3 days              Patient currently is not medically stable to d/c. d/c pending post transfusion labs       Consultants:    Pulm  Procedures:  LE [place] Summary:  RIGHT:  - There is no evidence of deep vein thrombosis in the lower extremity.    LEFT:  - There is no evidence of deep vein thrombosis in the lower extremity.   Antimicrobials: Anti-infectives (From admission, onward)   Start     Dose/Rate Route Frequency Ordered Stop   08/17/20 2230  amoxicillin-clavulanate (AUGMENTIN) 875-125 MG per tablet 1 tablet  Status:  Discontinued        1 tablet Oral Every 12 hours 08/17/20 1636 08/19/20 1754   08/11/20 1000  remdesivir 100 mg in sodium chloride 0.9 % 100 mL IVPB       "Followed by" Linked Group Details   100 mg 200 mL/hr over 30 Minutes Intravenous Daily 08/09/20 2256 08/14/20 0955   08/10/20 2000  azithromycin (ZITHROMAX) 500 mg in sodium chloride 0.9 % 250 mL IVPB  Status:  Discontinued        500 mg 250 mL/hr over 60 Minutes Intravenous Every 24 hours 08/09/20 2256 08/12/20 1042  08/10/20 1800  cefTRIAXone (ROCEPHIN) 2 g in sodium chloride 0.9 % 100 mL IVPB  Status:  Discontinued        2 g 200 mL/hr over 30 Minutes Intravenous Every 24 hours 08/09/20 2256 08/09/20 2332   08/10/20 0015  Ampicillin-Sulbactam (UNASYN) 3 g in sodium chloride 0.9 % 100 mL IVPB        3 g 200 mL/hr over 30 Minutes Intravenous Every 8 hours 08/10/20 0002 08/16/20 1706   08/10/20 0000  remdesivir 200 mg in sodium chloride 0.9% 250 mL IVPB       "Followed by" Linked Group Details   200 mg 580 mL/hr over 30 Minutes Intravenous Once 08/09/20 2256 08/10/20 0250   08/09/20 2030  cefTRIAXone (ROCEPHIN) 1 g in sodium chloride 0.9 % 100 mL IVPB        1 g 200 mL/hr over 30 Minutes Intravenous  Once 08/09/20 2020 08/09/20 2139   08/09/20 2030  azithromycin (ZITHROMAX) 500 mg in sodium chloride 0.9 % 250 mL IVPB        500 mg 250 mL/hr over 60 Minutes Intravenous  Once 08/09/20 2020 08/09/20 2140      Subjective: Eager for d/c  Objective: Vitals:   08/20/20 0557 08/20/20 0848 08/20/20 1239 08/20/20 1538  BP:  (!)  149/54 (!) 144/55 (!) 161/53  Pulse: 69 71  78  Resp: 19 18  17   Temp:  98.6 F (37 C)  98.3 F (36.8 C)  TempSrc:  Oral  Oral  SpO2: 100% 99%  97%  Weight: 59.6 kg     Height:        Intake/Output Summary (Last 24 hours) at 08/20/2020 1551 Last data filed at 08/20/2020 0500 Gross per 24 hour  Intake 240 ml  Output 500 ml  Net -260 ml   Filed Weights   08/17/20 0500 08/19/20 0526 08/20/20 0557  Weight: 62.9 kg 65.1 kg 59.6 kg    Examination:  General: No acute distress. Cardiovascular: Heart sounds show Eryk Beavers regular rate, and rhythm. Lungs: Clear to auscultation bilaterally Abdomen: Soft, nontender, nondistended  Neurological: Alert and oriented 3. Moves all extremities 4  Cranial nerves II through XII grossly intact. Skin: Warm and dry. No rashes or lesions. Extremities: R AKA.   Data Reviewed: I have personally reviewed following labs and imaging studies  CBC: Recent Labs  Lab 08/15/20 1130 08/16/20 0908 08/17/20 0431 08/18/20 0409 08/19/20 0842  WBC 11.3* 13.3* 10.8* 12.0* 11.0*  NEUTROABS 8.0* 9.3* 7.2 7.8* 7.0  HGB 8.3* 8.7* 8.6* 7.9* 7.0*  HCT 25.6* 27.0* 26.7* 24.2* 21.5*  MCV 82.8 82.1 83.2 82.9 83.3  PLT 219 236 213 223 228    Basic Metabolic Panel: Recent Labs  Lab 08/15/20 1130 08/16/20 0908 08/17/20 0431 08/18/20 0409 08/19/20 0842  NA 137 139 136 137 136  K 2.9* 3.3* 3.8 3.7 3.9  CL 104 106 106 102 102  CO2 25 24 25 26 24   GLUCOSE 157* 117* 104* 122* 178*  BUN 29* 24* 19 21 21   CREATININE 1.42* 1.28* 1.26* 1.28* 1.34*  CALCIUM 8.0* 8.4* 8.1* 8.3* 8.2*  MG 1.9 1.8 1.9 2.0 1.8  PHOS  --   --  2.3* 2.3* 2.2*    GFR: Estimated Creatinine Clearance: 39.5 mL/min (Barrie Sigmund) (by C-G formula based on SCr of 1.34 mg/dL (H)).  Liver Function Tests: Recent Labs  Lab 08/14/20 0859 08/17/20 0431 08/18/20 0409 08/19/20 0842  AST 22 14* 13* 14*  ALT 35 19 19 17  ALKPHOS 77 64 69 65  BILITOT 0.5 0.4 0.4 0.6  PROT 7.7 6.0* 5.8* 5.6*  ALBUMIN  2.6* 2.2* 2.1* 2.1*    CBG: Recent Labs  Lab 08/19/20 1108 08/19/20 1605 08/19/20 2130 08/20/20 0728 08/20/20 1122  GLUCAP 135* 181* 216* 163* 199*     Recent Results (from the past 240 hour(s))  MRSA PCR Screening     Status: None   Collection Time: 08/10/20 10:00 PM   Specimen: Nasal Mucosa; Nasopharyngeal  Result Value Ref Range Status   MRSA by PCR NEGATIVE NEGATIVE Final    Comment:        The GeneXpert MRSA Assay (FDA approved for NASAL specimens only), is one component of Vedh Ptacek comprehensive MRSA colonization surveillance program. It is not intended to diagnose MRSA infection nor to guide or monitor treatment for MRSA infections. Performed at [facility], [address], [place], [place] [address]   Body fluid culture (includes gram stain)     Status: None   Collection Time: 08/16/20  2:07 PM   Specimen: Pleural Fluid  Result Value Ref Range Status   Specimen Description FLUID  Final   Special Requests NONE  Final   Gram Stain   Final    RARE WBC PRESENT,BOTH PMN AND MONONUCLEAR NO ORGANISMS SEEN CYTOSPIN SMEAR    Culture   Final    NO GROWTH 3 DAYS Performed at [facility], [address], [place], [place] [address]    Report Status 08/20/2020 FINAL  Final         Radiology Studies: CT CHEST WO CONTRAST  Result Date: 08/19/2020 CLINICAL DATA:  Reduction in pleural fluid following drainage placement with loculated pneumothorax. EXAM: CT CHEST WITHOUT CONTRAST TECHNIQUE: Multidetector CT imaging of the chest was performed following the standard protocol without IV contrast. COMPARISON:  CT from 08/10/2020, chest x-ray from 08/19/2020 FINDINGS: Cardiovascular: Somewhat limited due to lack of IV contrast. Aortic calcifications and coronary calcifications are noted. No cardiac enlargement is seen. No enlargement of the pulmonary artery is seen. No aneurysmal dilatation of the aorta is noted. Mediastinum/Nodes: Thoracic inlet is within normal  limits. No sizable hilar or mediastinal adenopathy is noted. Mild circumferential thickening of the esophagus is noted possibly related to reflux disease. No mass lesion is noted. Lungs/Pleura: Left lung is well aerated without focal infiltrate or sizable effusion. Right lung shows lower lobe consolidation which has improved in the interval from the prior exam. Right-sided drainage catheter has been removed in the interval from the prior chest x-ray. The previously seen pleural fluid has reduced significantly now with considerable air within. This is likely related to incomplete re-expansion of the right lung. Upper Abdomen: Visualized upper abdomen is within normal limits. Musculoskeletal: Degenerative changes of the thoracic spine are noted. No acute rib abnormality is seen. IMPRESSION: Interval removal of right-sided drainage catheter. Significant reduction in pleural fluid when compared with previous CT examination. The space is now occupied predominately by air with Olga Bourbeau small amount of complex fluid identified. Persistent but improved consolidation in the right lower lobe is noted. Aortic Atherosclerosis (ICD10-I70.0). Electronically Signed   By: Alcide Clever M.D.   On: 08/19/2020 15:01   DG Chest Port 1 View  Result Date: 08/20/2020 CLINICAL DATA:  Complication of chest tube.  COVID. EXAM: PORTABLE CHEST 1 VIEW COMPARISON:  CT 08/19/2020.  Chest x-ray 08/19/2020. FINDINGS: Interim removal of chest tube. Right base pleural air collection again noted without interim change. Progressive right base atelectasis/consolidation.  Tiny right pleural effusion. Heart size stable. Degenerative change thoracic spine. IMPRESSION: Interim removal of chest tube. Right base pleural air collection again noted without interim change. Progressive right base atelectasis/consolidation. Tiny right pleural effusion. Electronically Signed   By: Maisie Fus  Register   On: 08/20/2020 06:00   DG Chest Port 1 View  Result Date:  08/19/2020 CLINICAL DATA:  Follow-up chest tube for loculated RIGHT pleural effusion. EXAM: PORTABLE CHEST 1 VIEW COMPARISON:  08/17/2020 FINDINGS: Brylei Pedley RIGHT pigtail thoracostomy tube is noted. Visualized portion of the loculated RIGHT pleural effusion now mainly contains gas. Bibasilar atelectasis is present. No other significant change identified. IMPRESSION: Visualized portion of the loculated RIGHT pleural effusion now mainly contains gas. RIGHT pigtail thoracostomy tube again noted. Bibasilar atelectasis. Electronically Signed   By: Harmon Pier M.D.   On: 08/19/2020 08:06        Scheduled Meds: . sodium chloride   Intravenous Once  . amLODipine  10 mg Oral Daily  . vitamin C  500 mg Oral Daily  . aspirin  81 mg Oral Daily  . atorvastatin  20 mg Oral Daily  . brimonidine  1 drop Both Eyes BID  . feeding supplement (ENSURE ENLIVE)  237 mL Oral TID BM  . ferrous sulfate  325 mg Oral BID WC  . hydrALAZINE  50 mg Oral Q8H  . insulin aspart  0-9 Units Subcutaneous TID WC  . metoprolol succinate  12.5 mg Oral Daily  . pantoprazole  40 mg Oral Daily  . sodium chloride flush  10 mL Intracatheter Q8H  . sodium chloride flush  3 mL Intravenous Q12H  . timolol  1 drop Both Eyes BID  . vitamin B-12  1,000 mcg Oral Daily  . zinc sulfate  220 mg Oral Daily   Continuous Infusions:    LOS: 11 days    Time spent: over 30 min    Lacretia Nicks, MD [facility]   To contact the attending provider between 7A-7P or the covering provider during after hours 7P-7A, please log into the web site [email or web address] and access using universal [place] password for that web site. If you do not have the password, please call the hospital operator.  08/20/2020, 3:51 PM

## 2020-08-21 DIAGNOSIS — J9601 Acute respiratory failure with hypoxia: Secondary | ICD-10-CM

## 2020-08-21 LAB — CBC WITH DIFFERENTIAL/PLATELET
Abs Immature Granulocytes: 0.12 10*3/uL — ABNORMAL HIGH (ref 0.00–0.07)
Basophils Absolute: 0.1 10*3/uL (ref 0.0–0.1)
Basophils Relative: 0 %
Eosinophils Absolute: 0.8 10*3/uL — ABNORMAL HIGH (ref 0.0–0.5)
Eosinophils Relative: 4 %
HCT: 23.6 % — ABNORMAL LOW (ref 39.0–52.0)
Hemoglobin: 7.7 g/dL — ABNORMAL LOW (ref 13.0–17.0)
Immature Granulocytes: 1 %
Lymphocytes Relative: 10 %
Lymphs Abs: 2.1 10*3/uL (ref 0.7–4.0)
MCH: 27.7 pg (ref 26.0–34.0)
MCHC: 32.6 g/dL (ref 30.0–36.0)
MCV: 84.9 fL (ref 80.0–100.0)
Monocytes Absolute: 2.1 10*3/uL — ABNORMAL HIGH (ref 0.1–1.0)
Monocytes Relative: 10 %
Neutro Abs: 15.1 10*3/uL — ABNORMAL HIGH (ref 1.7–7.7)
Neutrophils Relative %: 75 %
Platelets: 223 10*3/uL (ref 150–400)
RBC: 2.78 MIL/uL — ABNORMAL LOW (ref 4.22–5.81)
RDW: 14.6 % (ref 11.5–15.5)
WBC: 20.2 10*3/uL — ABNORMAL HIGH (ref 4.0–10.5)
nRBC: 0 % (ref 0.0–0.2)

## 2020-08-21 LAB — COMPREHENSIVE METABOLIC PANEL
ALT: 16 U/L (ref 0–44)
AST: 14 U/L — ABNORMAL LOW (ref 15–41)
Albumin: 2.2 g/dL — ABNORMAL LOW (ref 3.5–5.0)
Alkaline Phosphatase: 66 U/L (ref 38–126)
Anion gap: 10 (ref 5–15)
BUN: 15 mg/dL (ref 8–23)
CO2: 26 mmol/L (ref 22–32)
Calcium: 8.5 mg/dL — ABNORMAL LOW (ref 8.9–10.3)
Chloride: 101 mmol/L (ref 98–111)
Creatinine, Ser: 1.23 mg/dL (ref 0.61–1.24)
GFR calc Af Amer: >60 mL/min (ref >60)
GFR calc non Af Amer: 57 mL/min — ABNORMAL LOW (ref >60)
Glucose, Bld: 128 mg/dL — ABNORMAL HIGH (ref 70–99)
Potassium: 3.8 mmol/L (ref 3.5–5.1)
Sodium: 137 mmol/L (ref 135–145)
Total Bilirubin: 0.4 mg/dL (ref 0.3–1.2)
Total Protein: 5.9 g/dL — ABNORMAL LOW (ref 6.5–8.1)

## 2020-08-21 LAB — GLUCOSE, CAPILLARY: Glucose-Capillary: 127 mg/dL — ABNORMAL HIGH (ref 70–99)

## 2020-08-21 MED ORDER — PANTOPRAZOLE SODIUM 40 MG PO TBEC: 40.0000 mg | DELAYED_RELEASE_TABLET | Freq: Every day | ORAL | 0 refills | Status: AC

## 2020-08-21 MED ORDER — CYANOCOBALAMIN 1000 MCG PO TABS: 1000.0000 ug | ORAL_TABLET | Freq: Every day | ORAL | 0 refills | Status: DC

## 2020-08-21 MED ORDER — METOPROLOL SUCCINATE ER 25 MG PO TB24: 12.5000 mg | ORAL_TABLET | Freq: Every day | ORAL | 0 refills | Status: DC

## 2020-08-21 MED ORDER — AMOXICILLIN-POT CLAVULANATE 875-125 MG PO TABS
1.0000 | ORAL_TABLET | Freq: Two times a day (BID) | ORAL | Status: DC
  Administered 2020-08-21: 1 via ORAL
  Filled 2020-08-21: qty 1

## 2020-08-21 MED ORDER — AMOXICILLIN-POT CLAVULANATE 875-125 MG PO TABS: 1.0000 | ORAL_TABLET | Freq: Two times a day (BID) | ORAL | 0 refills | Status: DC

## 2020-08-21 NOTE — Discharge Summary (Signed)
Physician Discharge Summary  Jeremiah [name] OZH:[id number] DOB: 1946/01/01 DOA: 08/09/2020  PCP: Jeremiah BrodAsenso, Philip, MD  Admit date: 08/09/2020 Discharge date: 08/21/2020  Time spent: 40 minutes  Recommendations for Outpatient Follow-up:  1. Follow outpatient CBC/CMP 2. Attention to leukocytosis on repeat CBC, discharged with 5 days abx 3. Attention to anemia outpatient, work up as indicated 4. Continue to quarantine per CDC protocol -> quarantine can end after October 7th if he continues to improve 5. Follow repeat CXR in Jeremiah [name] few weeks to follow chronic right sided effusion 6. Follow with vascular surgery 1-2 weeks after discharge 7. Follow with cardiology outpatient for moderate aortic stenosis 8. Follow heart rate and blood pressure, metop dose reduced 9. Follow iron and b12 deficiency 10. Follow wound care outpatient   Discharge Diagnoses:  Active Problems:   PVD (peripheral vascular disease) (HCC)   Hypertension   HLD (hyperlipidemia)   Leukocytosis   S/P AKA (above knee amputation) (HCC)   Diabetes mellitus type 2 in nonobese (HCC)   Dyslipidemia   Type 2 diabetes mellitus with peripheral neuropathy (HCC)   Pneumonia due to COVID-19 virus   Acute metabolic encephalopathy   Dysphagia   Chest tube in place  Discharge Condition: stable  Diet recommendation: heart healthy  Filed Weights   08/19/20 0526 08/20/20 0557 08/21/20 0446  Weight: 65.1 kg 59.6 kg 58.7 kg    History of present illness:  [name], HTN, iron deficiency anemia, [name], [name], chronic diastolic CHF, aortic stenosis, [name], [name], [name], [name], apparently has had diarrhea for the last several weeks, upon arrival to the ER he was found to have possible COVID-19 pneumonia versus aspiration pneumonia along with dehydration and metabolic encephalopathy and he was admitted to the  hospital. He is fully vaccinated for COVID-19.  He was seen for acute hypoxic respiratory failure secondary to aspiration pneumonia and covid 19 infection.  [name] improved after antibiotics and covid treatment.  He had chronic R sided effusion which was enlarging.  He was seen by pulm who placed pigtail and gave tpa x2.  Removed after 3 days, no recommendation for outpatient follow up or additional abx.  He required transfusion on 9/26 for anemia.  [name].  See below for additional details  Hospital Course:  Acute Hypoxic Respiratory Failure  Aspiration Pneumonia  Chronic Right Pleural Effusion, exudative  COVID 19 infection Vaccinated CXR 9/22 with loculated R effusion CT 9/17 with enlarging chronic R effusion and patchy R upper lobe airspace disease Currently on RA, had mild hypoxia earlier in hospitalization Per previous provider, COVID 19 infection was not thought to be primary driver of his illness.  S/p 5 days of remdesivir.  He'll need to quarantine until Oct 7th. Being treated for aspiration pneumonia with unasyn - ok to d/c abx per pulm Pulm c/s for chronic pulm effusion, appreciate assistance Blood and urine cx NG.  MRSA PCR negative. CXR 9/24 with pigtail catheter on R, loculated pleural effusion with patchy atelectasis/infiltrate on R CXR 9/26 with visualized portion of loculated R effusion now mainly contains gas.  R pigtail thoracostomy tube.   CT 9/26 chest with interval removal of R sided drainage catheter, reduction in fluid when compared to previous CT.  The space is now occupied predominately by air with Jeremiah [name] small amount of complex fluid identified.  Persistent, but improved consolidation  of R lower lobe. CXR 9/27 with right base pleural air collection and progressive R base atelectasis/consolidation TPA x 2 per pulmonary S/p pigtail 9/23, follow thora labs - exudative effusion by light's - cytology (no malgnant cells) and culture (no  growth) Appreciate pulm assistance, ok to d/c abx, chest tube, and no f/u needed per pulm  COVID-19 Labs  Recent Labs    08/19/20 0842 08/20/20 1656  DDIMER 1.42* 2.17*  FERRITIN 256 257  CRP 0.9 0.8    Lab Results  Component Value Date   SARSCOV2NAA POSITIVE (Jeremiah [name]) 08/09/2020   SARSCOV2NAA NEGATIVE 06/25/2020   SARSCOV2NAA NEGATIVE 06/18/2020   SARSCOV2NAA NEGATIVE 04/18/2020   PAD with R AKA, recent L. 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 Wound care on board, continue on dual antiplatelet therapy along with statin for secondary prevention, he follows with Dr. Randie [name], wound care has been consulted, seen by vascular surgery for now wound actually now seems to be improving per vascular surgery, continue supportive care.Case discussed with Dr. Dwaine [name] with him post discharge in 1 to 2 weeks.  CT PE with possible small linear filling defect that could relate to previous PE, unclear significance, no acute PE, will continue to monitor - negative LE [place] for DVT.   4. AcuteMetabolic encephalopathy upon admission. Likely 2/2 aspiration pneumonia. Now improved.  6. History of aortic stenosis at least moderate per recent echocardiogram. Outpatient cardiology follow-up.Aortic valve area is 1.34 cm and V-max is 3.27 m/s. Continue beta-blocker. Avoid sudden preload reduction.   7. Essential hypertension. On beta-blocker, dose decreased as he was slightly bradycardic (holding parameters), added Norvascas well for better control.  8. Dehydration due to diarrhea. Diarrhea seems to have resolved. Improved after IV fluids.  9. CKD 3A. Baseline creatinine around 1.4. Monitor.  10. Chronic iron deficiency anemia  Vitamin [name] Continue iron supplementation Start B12 supplementation based on August labs Repeat labs - normal iron, b12 folate Hb downtrending to 7 9/26, s/p 1 unit pRBC - Hb stable, appropriate bump   Follow outpt  11. Elevated D-dimer. Likely due to infection and inflammation, negative lower extremity ultrasound. No clinical signs or symptoms of PE. Most likely D-dimer high due to inflammation from aspiration pneumonia.  12.Hypokalemia. Replaced.   13.DM type II. On sliding scale.  Pressure Ulcer Frequent turns, foam Wound care per wound c/s Pressure Injury 08/10/20 Sacrum Posterior Stage 2 -  Partial thickness loss of dermis presenting as Lundon Rosier shallow open injury with Dunya Meiners red, pink wound bed without slough. pink - Quarter size and dime sized areas (Active)  08/10/20 2145  Location: Sacrum  Location Orientation: Posterior  Staging: Stage 2 -  Partial thickness loss of dermis presenting as Verlean Allport shallow open injury with Timya Trimmer red, pink wound bed without slough.  Wound Description (Comments): pink - Quarter size and dime sized areas  Present on Admission: No   Procedures: LE [place] Summary:  RIGHT:  - There is no evidence of deep vein thrombosis in the lower extremity.    LEFT:  - There is no evidence of deep vein thrombosis in the lower extremity.  Consultations:  Vascular  pulmonology  Discharge Exam: Vitals:   08/21/20 0744 08/21/20 0905  BP: (!) 149/50   Pulse:  72  Resp:    Temp: 98.8 F (37.1 C)   SpO2:     No new complaints eager for discharge Discussed with wife  General: No acute distress. Cardiovascular: Heart sounds show Kostas Marrow regular rate, and rhythm  Lungs: Clear to auscultation bilaterally  Abdomen: Soft, nontender, nondistended Neurological: Alert and oriented 3. Moves all extremities 4 . Cranial nerves II through XII grossly intact. Skin: ulcers not visualized today Extremities: R aka Discharge Instructions   Discharge Instructions    Call MD for:  difficulty breathing, headache or visual disturbances   Complete by: As directed    Call MD for:  extreme fatigue   Complete by: As directed    Call MD for:  hives   Complete by: As directed     Call MD for:  persistant dizziness or light-headedness   Complete by: As directed    Call MD for:  persistant nausea and vomiting   Complete by: As directed    Call MD for:  redness, tenderness, or signs of infection (pain, swelling, redness, odor or green/yellow discharge around incision site)   Complete by: As directed    Call MD for:  severe uncontrolled pain   Complete by: As directed    Call MD for:  temperature >100.4   Complete by: As directed    Diet - low sodium heart healthy   Complete by: As directed    Discharge instructions   Complete by: As directed    You were seen for aspiration pneumonia and covid 19 pneumonia.  You've improved after antibiotics and covid 19 treatments.  You also had fluid on your right chest which was drained by chest tube.  Pulmonology saw you and determined there was no need for outpatient follow up based on your labs and imaging.  Please follow up an outpatient chest x ray in Demi Trieu few weeks.  You have Jaleia Hanke left heel wound that needs to be followed outpatient by vascular.  You have Capria Cartaya sacral decubitus that should be followed outpatient.  Continue wound care as recommended.  Return for new, recurrent, or worsening symptoms.  Please ask your PCP to request records from this hospitalization so they know what was done and what the next steps will be.   Discharge wound care:   Complete by: As directed    To right buttocks and sacrum: cleanse with normal saline, pat dry.  Cover healing areas with single layer of xeroform gauze (lawson # [phone]), top with dry gauze 4x4 and cover with silicone foam dressing.  Change xeroform daily, may reuse silicone foam for up to 3 days.  Change as needed for rolling of dressing edges or soiling.  Left heel: twice daily - wash foot with soap and water, rinse and pat dry.  Paid with betadine swabstick and allow to air-dry.  No dressing.  Place foot into prevalon boot.   Increase activity slowly   Complete by: As directed       Allergies as of 08/21/2020   No Known Allergies     Medication List    TAKE these medications   acetaminophen 500 MG tablet Commonly known as: TYLENOL Take 1,000 mg by mouth every 6 (six) hours as needed for moderate pain or headache.   amLODipine 10 MG tablet Commonly known as: NORVASC Take 10 mg by mouth daily.   amoxicillin-clavulanate 875-125 MG tablet Commonly known as: AUGMENTIN Take 1 tablet by mouth every 12 (twelve) hours for 5 days.   aspirin 81 MG tablet Take 81 mg by mouth daily.   atorvastatin 20 MG tablet Commonly known as: LIPITOR Take 1 tablet (20 mg total) by mouth daily.   brimonidine 0.2 % ophthalmic solution Commonly known as: ALPHAGAN Place 1 drop into both  eyes 2 (two) times daily.   clopidogrel 75 MG tablet Commonly known as: PLAVIX Take 1 tablet (75 mg total) by mouth daily with breakfast.   cyanocobalamin 1000 MCG tablet Take 1 tablet (1,000 mcg total) by mouth daily.   ferrous sulfate 325 (65 FE) MG tablet Take 1 tablet (325 mg total) by mouth 2 (two) times daily with Breindel Collier meal.   gabapentin 300 MG capsule Commonly known as: NEURONTIN Take 1 capsule (300 mg total) by mouth 2 (two) times daily.   glimepiride 1 MG tablet Commonly known as: AMARYL Take 1 tablet (1 mg total) by mouth daily with breakfast.   glucose blood test strip Accu-Chek Aviva Plus test strips  Take 1 strip 3 times Luisdaniel Kenton day by miscell. route for 90 days.   glucose blood test strip Accu-Chek Aviva Plus test strips  USE AS DIRECTED THREE TIMES DAILY.   hydrALAZINE 100 MG tablet Commonly known as: APRESOLINE Take 0.5 tablets (50 mg total) by mouth every 8 (eight) hours. What changed: when to take this   metoprolol succinate 25 MG 24 hr tablet Commonly known as: TOPROL-XL Take 0.5 tablets (12.5 mg total) by mouth daily. What changed:   medication strength  how much to take   pantoprazole 40 MG tablet Commonly known as: PROTONIX Take 1 tablet (40 mg total) by  mouth daily.   timolol 0.5 % ophthalmic solution Commonly known as: TIMOPTIC Place 1 drop into both eyes 2 (two) times daily.   traMADol 50 MG tablet Commonly known as: ULTRAM Take 50 mg by mouth every 8 (eight) hours as needed for moderate pain.            Discharge Care Instructions  (From admission, onward)         Start     Ordered   08/21/20 0000  Discharge wound care:       Comments: To right buttocks and sacrum: cleanse with normal saline, pat dry.  Cover healing areas with single layer of xeroform gauze (lawson # [phone]), top with dry gauze 4x4 and cover with silicone foam dressing.  Change xeroform daily, may reuse silicone foam for up to 3 days.  Change as needed for rolling of dressing edges or soiling.  Left heel: twice daily - wash foot with soap and water, rinse and pat dry.  Paid with betadine swabstick and allow to air-dry.  No dressing.  Place foot into prevalon boot.   08/21/20 0900         No Known Allergies  Contact information for after-discharge care    Destination    [facility] .   Service: Skilled Nursing Contact information: [address] [place] [address] [phone]                   The results of significant diagnostics from this hospitalization (including imaging, microbiology, ancillary and laboratory) are listed below for reference.    Significant Diagnostic Studies: DG Chest 1 View  Result Date: 08/17/2020 CLINICAL DATA:  Chest tube present.  Pleural effusion. EXAM: CHEST  1 VIEW COMPARISON:  August 16, 2020 FINDINGS: Pigtail catheter again noted on the right, unchanged in position. No evident pneumothorax. Loculated pleural effusion again noted with areas of atelectasis and patchy airspace opacity in the lateral right mid and lower lung regions. Left lung is clear. Heart is mildly prominent, stable, with pulmonary vascularity normal. No adenopathy. There is aortic atherosclerosis. Idalys Konecny focus of  sclerosis is noted in the proximal  right humerus, incompletely visualized. IMPRESSION: Stable pigtail catheter position on the right. Loculated pleural effusion with patchy atelectasis/infiltrate on the right. Left lung clear. Stable cardiac prominence. No pneumothorax. Small chondroid matrix lesion in the proximal right humerus consistent with either small enchondroma or infarct. Aortic Atherosclerosis (ICD10-I70.0). Electronically Signed   By: Bretta Bang III M.D.   On: 08/17/2020 08:02   DG Chest 1 View  Result Date: 08/16/2020 CLINICAL DATA:  Chest tube in place. EXAM: CHEST  1 VIEW COMPARISON:  Radiograph yesterday.  CT 08/10/2020 FINDINGS: Placement of right pigtail catheter with catheter tip in the mid lower lung zone. Slight decreased right pleural effusion with partially loculated fluid persisting inferior laterally as well as right basilar atelectasis. No visualized pneumothorax. Heart is upper normal size. Unchanged mediastinal contours. Aortic atherosclerosis. Left lung essentially clear. IMPRESSION: Placement of right pigtail catheter with slight decreased right pleural effusion. Residual partially loculated fluid in the inferior lateral right hemithorax. No visualized pneumothorax. Electronically Signed   By: Narda Rutherford M.D.   On: 08/16/2020 15:07   CT HEAD WO CONTRAST  Result Date: 08/09/2020 CLINICAL DATA:  Delirium EXAM: CT HEAD WITHOUT CONTRAST TECHNIQUE: Contiguous axial images were obtained from the base of the skull through the vertex without intravenous contrast. COMPARISON:  06/25/2020 FINDINGS: Brain: Normal anatomic configuration. Parenchymal volume loss is commensurate with the patient's age. Mild-to-moderate periventricular white matter changes are present likely reflecting the sequela of small vessel ischemia. No abnormal intra or extra-axial mass lesion or fluid collection. No abnormal mass effect or midline shift. No evidence of acute intracranial hemorrhage or  infarct. Ventricular size is normal. Cerebellum unremarkable. Vascular: Extensive calcifications are seen within the carotid siphons and distal vertebral arteries bilaterally. No asymmetric hyperdense vasculature at the skull base. Skull: Intact Sinuses/Orbits: The paranasal sinuses are clear. Intraorbital surgical implant is seen along the lateral aspect of the right ocular globe. The orbits are otherwise unremarkable. Other: Mastoid air cells and middle ear cavities are clear. IMPRESSION: 1. No acute intracranial abnormality. 2. Mild-to-moderate periventricular white matter changes likely reflecting the sequela of small vessel ischemia. Electronically Signed   By: Helyn Numbers MD   On: 08/09/2020 23:46   CT CHEST WO CONTRAST  Result Date: 08/19/2020 CLINICAL DATA:  Reduction in pleural fluid following drainage placement with loculated pneumothorax. EXAM: CT CHEST WITHOUT CONTRAST TECHNIQUE: Multidetector CT imaging of the chest was performed following the standard protocol without IV contrast. COMPARISON:  CT from 08/10/2020, chest x-ray from 08/19/2020 FINDINGS: Cardiovascular: Somewhat limited due to lack of IV contrast. Aortic calcifications and coronary calcifications are noted. No cardiac enlargement is seen. No enlargement of the pulmonary artery is seen. No aneurysmal dilatation of the aorta is noted. Mediastinum/Nodes: Thoracic inlet is within normal limits. No sizable hilar or mediastinal adenopathy is noted. Mild circumferential thickening of the esophagus is noted possibly related to reflux disease. No mass lesion is noted. Lungs/Pleura: Left lung is well aerated without focal infiltrate or sizable effusion. Right lung shows lower lobe consolidation which has improved in the interval from the prior exam. Right-sided drainage catheter has been removed in the interval from the prior chest x-ray. The previously seen pleural fluid has reduced significantly now with considerable air within. This is  likely related to incomplete re-expansion of the right lung. Upper Abdomen: Visualized upper abdomen is within normal limits. Musculoskeletal: Degenerative changes of the thoracic spine are noted. No acute rib abnormality is seen. IMPRESSION: Interval removal of right-sided drainage catheter. Significant reduction in  pleural fluid when compared with previous CT examination. The space is now occupied predominately by air with Dayanna Pryce small amount of complex fluid identified. Persistent but improved consolidation in the right lower lobe is noted. Aortic Atherosclerosis (ICD10-I70.0). Electronically Signed   By: Alcide Clever M.D.   On: 08/19/2020 15:01   CT ANGIO CHEST PE W OR WO CONTRAST  Result Date: 08/10/2020 CLINICAL DATA:  Altered mental status with cough and diarrhea for 1 week. Pulmonary embolism suspected. EXAM: CT ANGIOGRAPHY CHEST WITH CONTRAST TECHNIQUE: Multidetector CT imaging of the chest was performed using the standard protocol during bolus administration of intravenous contrast. Multiplanar CT image reconstructions and MIPs were obtained to evaluate the vascular anatomy. CONTRAST:  75mL OMNIPAQUE IOHEXOL 350 MG/ML SOLN COMPARISON:  Radiographs 08/09/2020. Noncontrast chest CT 11/05/2018. FINDINGS: Cardiovascular: The pulmonary arteries are well opacified with contrast to the level of the subsegmental branches. There is no evidence of acute pulmonary embolism. There is Truong Delcastillo possible small linear filling defect within the right lower lobe segmental pulmonary artery (image 51/5) which could relate to previous pulmonary embolism. There is limited luminal opacification of the systemic vessels without evidence of acute findings. There is diffuse atherosclerosis of the aorta, great vessels and coronary arteries. Aortic valvular calcifications are present. The heart size is normal. There is no pericardial effusion. Mediastinum/Nodes: There are no enlarged mediastinal, hilar or axillary lymph nodes. The thyroid  gland, trachea and esophagus demonstrate no significant findings. Lungs/Pleura: Chronic dependent right pleural effusion is associated with pleural thickening and has mildly progressed compared with the prior CT. There is resulting increased compressive atelectasis of the right middle and lower lobes. The lower lobe is completely collapsed, and there is abrupt cut off of the right lower and middle lobe bronchi proximally. Right middle lobe atelectasis may obscure the previously demonstrated right middle lobe nodular density. There is new patchy right upper lobe airspace disease, suspicious for pneumonia. The left lung is clear. There are small calcified pleural plaques bilaterally. Upper abdomen: The visualized upper abdomen appears stable without acute findings. Musculoskeletal/Chest wall: There is no chest wall mass or suspicious osseous finding. Review of the MIP images confirms the above findings. IMPRESSION: 1. No evidence of acute pulmonary embolism. Possible small linear filling defect within the right lower lobe segmental pulmonary artery could relate to previous pulmonary embolism. 2. Enlarging chronic right pleural effusion with increased compressive atelectasis of the right middle and lower lobes and associated central bronchial occlusion. Underlying neoplasm cannot be excluded. Further evaluation recommended with thoracentesis and possible bronchoscopy. 3. New patchy right upper lobe airspace disease, suspicious for pneumonia. Radiographic follow up recommended. 4. 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 5. Aortic Atherosclerosis (ICD10-I70.0). Electronically Signed   By: Carey Bullocks M.D.   On: 08/10/2020 11:50   DG Chest Port 1 View  Result Date: 08/20/2020 CLINICAL DATA:  Complication of chest tube.  COVID. EXAM: PORTABLE CHEST 1 VIEW COMPARISON:  CT 08/19/2020.  Chest x-ray 08/19/2020. FINDINGS: Interim removal of chest tube. Right base pleural air  collection again noted without interim change. Progressive right base atelectasis/consolidation. Tiny right pleural effusion. Heart size stable. Degenerative change thoracic spine. IMPRESSION: Interim removal of chest tube. Right base pleural air collection again noted without interim change. Progressive right base atelectasis/consolidation. Tiny right pleural effusion. Electronically Signed   By: Maisie Fus  Register   On: 08/20/2020 06:00   DG Chest Port 1 View  Result Date: 08/19/2020 CLINICAL DATA:  Follow-up chest tube for loculated RIGHT  pleural effusion. EXAM: PORTABLE CHEST 1 VIEW COMPARISON:  08/17/2020 FINDINGS: Kareem Aul RIGHT pigtail thoracostomy tube is noted. Visualized portion of the loculated RIGHT pleural effusion now mainly contains gas. Bibasilar atelectasis is present. No other significant change identified. IMPRESSION: Visualized portion of the loculated RIGHT pleural effusion now mainly contains gas. RIGHT pigtail thoracostomy tube again noted. Bibasilar atelectasis. Electronically Signed   By: Harmon Pier M.D.   On: 08/19/2020 08:06   DG CHEST PORT 1 VIEW  Result Date: 08/15/2020 CLINICAL DATA:  Pleural effusion.  COVID positive. EXAM: PORTABLE CHEST 1 VIEW COMPARISON:  CTA chest dated August 10, 2020. Chest x-ray dated August 09, 2020. FINDINGS: The heart size and mediastinal contours are within normal limits. Normal pulmonary vascularity. Unchanged partially loculated right pleural effusion with slightly improved aeration of the right middle and lower lobes. Left lung is clear. No pneumothorax. No acute osseous abnormality. Partially visualized small chondroid lesion in the right proximal humerus, likely an enchondroma. IMPRESSION: 1. Unchanged loculated right pleural effusion with slightly improved aeration of the right middle and lower lobes. Electronically Signed   By: Obie Dredge M.D.   On: 08/15/2020 16:30   DG Chest Port 1 View  Result Date: 08/09/2020 CLINICAL DATA:   Questionable sepsis EXAM: PORTABLE CHEST 1 VIEW COMPARISON:  Chest x-ray 06/25/2020 FINDINGS: The heart size and mediastinal contours are unchanged. Aortic arch calcifications. Interval development of hazy airspace opacity within the right lower lobe. No pulmonary edema. Similar-appearing at least small to moderate volume right pleural effusion. No left pleural effusion. No pneumothorax. No acute osseous abnormality. IMPRESSION: Stable small to moderate right pleural effusion with interval increase in right lower lobe opacity that may represent inflammation/infection versus passive atelectasis. Electronically Signed   By: Tish Frederickson M.D.   On: 08/09/2020 20:14   DG Foot 2 Views Left  Result Date: 08/09/2020 CLINICAL DATA:  Diabetic left ankle ulceration, EXAM: LEFT FOOT - 2 VIEW COMPARISON:  06/25/2020 FINDINGS: Frontal and lateral views of the left foot are obtained. Stable osteopenia. No acute fracture, subluxation, or dislocation. Marked joint space narrowing and osteophyte formation within the midfoot, consistent with osteoarthritis or developing Charcot arthropathy. Large inferior calcaneal spur unchanged. No radiographic evidence of osteomyelitis. There is progressive dorsal soft tissue swelling of the forefoot. IMPRESSION: 1. Progressive soft tissue swelling of the forefoot. 2. Stable degenerative changes.  No acute or destructive process. Electronically Signed   By: Sharlet Salina M.D.   On: 08/09/2020 23:16   VAS [place] LOWER EXTREMITY VENOUS (DVT)  Result Date: 08/12/2020  Lower Venous DVTStudy Indications: Covid-19, Elevated D-Dimer.  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 Comparison Study: No prior study on file Performing Technologist: Sherren Kerns RVS  Examination Guidelines: Lynia Landry complete evaluation includes B-mode imaging, spectral Doppler, color Doppler, and power Doppler as needed of all accessible portions of each vessel. Bilateral testing is considered an integral part of Aviyanna Colbaugh complete  examination. Limited examinations for reoccurring indications may be performed as noted. The reflux portion of the exam is performed with the patient in reverse Trendelenburg.  +---------+---------------+---------+-----------+----------+--------------+ RIGHT    CompressibilityPhasicitySpontaneityPropertiesThrombus Aging +---------+---------------+---------+-----------+----------+--------------+ CFV      Full           Yes      Yes                                 +---------+---------------+---------+-----------+----------+--------------+ SFJ      Full                                                        +---------+---------------+---------+-----------+----------+--------------+  FV Prox  Full                                                        +---------+---------------+---------+-----------+----------+--------------+ FV Mid   Full                                                        +---------+---------------+---------+-----------+----------+--------------+ FV DistalFull                                                        +---------+---------------+---------+-----------+----------+--------------+ PFV      Full                                                        +---------+---------------+---------+-----------+----------+--------------+ POP                                                   AKA            +---------+---------------+---------+-----------+----------+--------------+ PTV                                                   AKA            +---------+---------------+---------+-----------+----------+--------------+ PERO                                                  AKA            +---------+---------------+---------+-----------+----------+--------------+   +---------+---------------+---------+-----------+----------+-------------------+ LEFT     CompressibilityPhasicitySpontaneityPropertiesThrombus Aging       +---------+---------------+---------+-----------+----------+-------------------+ CFV      Full           Yes      Yes                                      +---------+---------------+---------+-----------+----------+-------------------+ SFJ      Full                                                             +---------+---------------+---------+-----------+----------+-------------------+ FV Prox  Full                                                             +---------+---------------+---------+-----------+----------+-------------------+  FV Mid   Full                                                             +---------+---------------+---------+-----------+----------+-------------------+ FV Distal               Yes      Yes                  patent by color and                                                       Doppler             +---------+---------------+---------+-----------+----------+-------------------+ POP      Full           Yes      Yes                                      +---------+---------------+---------+-----------+----------+-------------------+ PTV      Full                                                             +---------+---------------+---------+-----------+----------+-------------------+ PERO     Full                                                             +---------+---------------+---------+-----------+----------+-------------------+     Summary: RIGHT: - There is no evidence of deep vein thrombosis in the lower extremity.  LEFT: - There is no evidence of deep vein thrombosis in the lower extremity.  *See table(s) above for measurements and observations. Electronically signed by Lemar Livings MD on 08/12/2020 at 11:03:46 AM.    Final     Microbiology: Recent Results (from the past 240 hour(s))  Body fluid culture (includes gram stain)     Status: None   Collection Time: 08/16/20  2:07 PM   Specimen: Pleural  Fluid  Result Value Ref Range Status   Specimen Description FLUID  Final   Special Requests NONE  Final   Gram Stain   Final    RARE WBC PRESENT,BOTH PMN AND MONONUCLEAR NO ORGANISMS SEEN CYTOSPIN SMEAR    Culture   Final    NO GROWTH 3 DAYS Performed at [facility], [address], [place], [place] [address]    Report Status 08/20/2020 FINAL  Final     Labs: Basic Metabolic Panel: Recent Labs  Lab 08/16/20 0908 08/16/20 0908 08/17/20 0431 08/18/20 0409 08/19/20 0842 08/20/20 1656 08/21/20 0507  NA 139   < > 136 137 136 138 137  K 3.3*   < > 3.8 3.7 3.9 4.0 3.8  CL  106   < > 106 102 102 102 101  CO2 24   < > 25 26 24 27 26   GLUCOSE 117*   < > 104* 122* 178* 133* 128*  BUN 24*   < > 19 21 21 20 15   CREATININE 1.28*   < > 1.26* 1.28* 1.34* 1.25* 1.23  CALCIUM 8.4*   < > 8.1* 8.3* 8.2* 8.2* 8.5*  MG 1.8  --  1.9 2.0 1.8 1.9  --   PHOS  --   --  2.3* 2.3* 2.2* 2.1*  --    < > = values in this interval not displayed.   Liver Function Tests: Recent Labs  Lab 08/17/20 0431 08/18/20 0409 08/19/20 0842 08/20/20 1656 08/21/20 0507  AST 14* 13* 14* 16 14*  ALT 19 19 17 17 16   ALKPHOS 64 69 65 70 66  BILITOT 0.4 0.4 0.6 0.5 0.4  PROT 6.0* 5.8* 5.6* 5.8* 5.9*  ALBUMIN 2.2* 2.1* 2.1* 2.3* 2.2*   No results for input(s): LIPASE, AMYLASE in the last 168 hours. No results for input(s): AMMONIA in the last 168 hours. CBC: Recent Labs  Lab 08/17/20 0431 08/18/20 0409 08/19/20 0842 08/20/20 1656 08/21/20 0507  WBC 10.8* 12.0* 11.0* 13.6* 20.2*  NEUTROABS 7.2 7.8* 7.0 8.6* 15.1*  HGB 8.6* 7.9* 7.0* 7.8* 7.7*  HCT 26.7* 24.2* 21.5* 23.8* 23.6*  MCV 83.2 82.9 83.3 83.8 84.9  PLT 213 223 228 224 223   Cardiac Enzymes: No results for input(s): CKTOTAL, CKMB, CKMBINDEX, TROPONINI in the last 168 hours. BNP: BNP (last 3 results) Recent Labs    08/12/20 1323 08/13/20 0402 08/14/20 0859  BNP 440.7* 332.9* 284.3*    ProBNP (last 3 results) No results for  input(s): PROBNP in the last 8760 hours.  CBG: Recent Labs  Lab 08/20/20 0728 08/20/20 1122 08/20/20 1603 08/20/20 1946 08/21/20 0640  GLUCAP 163* 199* 133* 105* 127*       Signed:  08/22/20 MD.  [facility] 08/21/2020, 9:12 AM

## 2020-08-21 NOTE — Progress Notes (Signed)
Report given to nurse at [facility].

## 2020-08-21 NOTE — Progress Notes (Signed)
Physical Therapy Treatment Patient Details Name: Jeremiah [name] MRN: [id number] DOB: 1946-01-29 Today's Date: 08/21/2020    History of Present Illness 74 yo male with L foot pain was found to be Covid positive.  Pt had L foot revascularization done 06/20/20.  Note a heel wound as well on LLE.  PMHx:  R AKA, DM, PVD, HTN, atherosclerosis, C-diff, gout, PNA, sleep apnea,     PT Comments    Pt supine on arrival, agreeable to therapy session with good participation and tolerance for session. Pt with limited session time 2/2 arrival of transport with gurney. Pt performed supine LLE A/AAROM therapeutic exercises as detailed below with good tolerance, needing multimodal cues for proper technique and increased time to initiate movements. Pt continues to benefit from PT services to progress toward functional mobility goals. Continue to recommend SNF level of rehab.   Follow Up Recommendations        Equipment Recommendations  None recommended by PT (defer to next facility)    Recommendations for Other Services       Precautions / Restrictions Precautions Precautions: Fall Precaution Comments: R AKA Required Braces or Orthoses: Other Brace Other Brace: RLE prosthesis Restrictions Weight Bearing Restrictions: No Other Position/Activity Restrictions: LLE heel pressure sore, deferred weight bearing on LLE 2/2 sore    Mobility  Bed Mobility Overal bed mobility: Needs Assistance             General bed mobility comments: UTA 2/2 arrival of transport team after supine exercises  Transfers                    Ambulation/Gait                 Stairs             Wheelchair Mobility    Modified Rankin (Stroke Patients Only)       Balance                                            Cognition Arousal/Alertness: Awake/alert Behavior During Therapy: WFL for tasks assessed/performed Overall Cognitive Status: Within Functional Limits for  tasks assessed Area of Impairment: Awareness;Problem solving;Following commands;Safety/judgement                       Following Commands: Follows one step commands with increased time Safety/Judgement: Decreased awareness of safety;Decreased awareness of deficits   Problem Solving: Slow processing;Difficulty sequencing;Requires verbal cues;Requires tactile cues;Decreased initiation General Comments: good participation within time available (session limited 2/2 arrival of transport team)      Exercises General Exercises - Lower Extremity Ankle Circles/Pumps: AROM;15 reps;Supine;Other (comment) (assist only to keep heel elevated above bed prevent shearing) Short Arc Quad: AROM;15 reps;Supine Heel Slides: AAROM;15 reps;Supine Hip ABduction/ADduction: AAROM;10 reps;Supine Other Exercises Other Exercises: LLE heel cord stretch 1x30 sec to address limited dorsiflexion ROM (to ~0 deg only DF)    General Comments General comments (skin integrity, edema, etc.): L heel pressure sore, with gauze at heel, prevalon boot donned pre/post session      Pertinent Vitals/Pain Pain Assessment: Faces Faces Pain Scale: No hurt Pain Descriptors / Indicators:  (none observed) Pain Intervention(s): Monitored during session         Vitals:   08/21/20 0744 08/21/20 0905  BP: (!) 149/50   Pulse:  72  Resp:  Temp: 98.8 F (37.1 C)   SpO2:     PT Goals (current goals can now be found in the care plan section) Acute Rehab PT Goals Patient Stated Goal: to get stronger and have enough help PT Goal Formulation: With patient Time For Goal Achievement: 08/24/20 Potential to Achieve Goals: Fair Progress towards PT goals: Progressing toward goals (slow progress toward goals)    Frequency    Min 3X/week      PT Plan Current plan remains appropriate    Co-evaluation              AM-PAC PT "6 Clicks" Mobility   Outcome Measure  Help needed turning from your back to your side  while in a flat bed without using bedrails?: A Lot Help needed moving from lying on your back to sitting on the side of a flat bed without using bedrails?: A Lot Help needed moving to and from a bed to a chair (including a wheelchair)?: Total Help needed standing up from a chair using your arms (e.g., wheelchair or bedside chair)?: Total Help needed to walk in hospital room?: Total Help needed climbing 3-5 steps with a railing? : Total 6 Click Score: 8    End of Session   Activity Tolerance: Patient tolerated treatment well Patient left: in bed;with call bell/phone within reach;Other (comment) (transport team arriving to room with gurney, RN present) Nurse Communication: Mobility status PT Visit Diagnosis: Other abnormalities of gait and mobility (R26.89);Muscle weakness (generalized) (M62.81);Difficulty in walking, not elsewhere classified (R26.2);Adult, failure to thrive (R62.7)     Time: 1110-1119 PT Time Calculation (min) (ACUTE ONLY): 9 min  Charges:  $Therapeutic Exercise: 8-22 mins                     Alexanderjames Berg P., PTA Acute Rehabilitation Services Pager: [phone] Office: [phone]   [place] 08/21/2020, 11:36 AM

## 2020-08-21 NOTE — Care Management Important Message (Signed)
Important Message  Patient Details  Name: Jeremiah [name] MRN: [id number] Date of Birth: 1946/09/08   Medicare Important Message Given:  Yes - Important Message mailed due to current National Emergency  Verbal consent obtained due to current National Emergency  Relationship to patient: Self Contact Name: Danford Tat Call Date: 08/21/20  Time: 1118 Phone: [phone] Outcome: Spoke with contact Important Message mailed to: Patient address on file    Jeremiah [name] 08/21/2020, 11:19 AM

## 2020-08-21 NOTE — Progress Notes (Signed)
IV d/c'd. Patient transported to [facility] by [facility] via Doctor, general practice. No acute distress noted when leaving.

## 2020-08-21 NOTE — TOC Transition Note (Signed)
Transition of Care [facility]) - CM/SW Discharge Note   Patient Details  Name: Jeremiah [name] MRN: [id number] Date of Birth: [birth year]  [facility] CM/SW Contact:  Janae Bridgeman, RN Phone Number: 08/21/2020, 10:20 AM   Clinical Narrative:    Case management left a message with [facility] for patient's transfer today to the facility this morning.  I placed discharge clinicals in the hub for [facility].  I called and spoke with the patient's son on the phone this morning and notified him that the patient should transfer to [place] around mid-day today via [facility].     Barriers to Discharge: Continued Medical Work up   Patient Goals and CMS Choice Patient states their goals for this hospitalization and ongoing recovery are:: Return home CMS Medicare.gov Compare Post Acute Care list provided to:: Patient Choice offered to / list presented to : Patient, Adult Children  Discharge Placement                       Discharge Plan and Services In-house Referral: Clinical Social Work                                   Social Determinants of Health (SDOH) Interventions     Readmission Risk Interventions Readmission Risk Prevention Plan 08/13/2020 07/08/2018 07/08/2018  Transportation Screening Complete - Complete  Medication Review [employer]) Referral to Pharmacy - Complete  PCP or Specialist appointment within 3-5 days of discharge Complete - Not Complete  HRI or Home Care Consult Complete (No Data) Complete  SW Recovery Care/Counseling Consult Complete Patient refused Not Complete  Palliative Care Screening Not Applicable - Not Complete  Comments - - (No Data)  Medication Reconcilation (Pharmacy) - - Not Complete  Skilled Nursing Facility Complete - Patient refused  Some recent data might be hidden

## 2020-08-26 ENCOUNTER — Emergency Department (HOSPITAL_COMMUNITY): Payer: Medicare Other

## 2020-08-26 ENCOUNTER — Other Ambulatory Visit: Payer: Self-pay

## 2020-08-26 ENCOUNTER — Encounter (HOSPITAL_COMMUNITY): Payer: Self-pay

## 2020-08-26 ENCOUNTER — Inpatient Hospital Stay (HOSPITAL_COMMUNITY)
Admission: EM | Admit: 2020-08-26 | Discharge: 2020-08-30 | DRG: 193 | Disposition: A | Discharge status: Skilled Nursing Facility | Payer: Medicare Other | Attending: Internal Medicine | Admitting: Internal Medicine

## 2020-08-26 DIAGNOSIS — Z9842 Cataract extraction status, left eye: Secondary | ICD-10-CM

## 2020-08-26 DIAGNOSIS — Z87891 Personal history of nicotine dependence: Secondary | ICD-10-CM

## 2020-08-26 DIAGNOSIS — Z961 Presence of intraocular lens: Secondary | ICD-10-CM | POA: Diagnosis present

## 2020-08-26 DIAGNOSIS — D638 Anemia in other chronic diseases classified elsewhere: Secondary | ICD-10-CM | POA: Diagnosis present

## 2020-08-26 DIAGNOSIS — L89626 Pressure-induced deep tissue damage of left heel: Secondary | ICD-10-CM | POA: Diagnosis present

## 2020-08-26 DIAGNOSIS — I10 Essential (primary) hypertension: Secondary | ICD-10-CM | POA: Diagnosis present

## 2020-08-26 DIAGNOSIS — E7800 Pure hypercholesterolemia, unspecified: Secondary | ICD-10-CM | POA: Diagnosis present

## 2020-08-26 DIAGNOSIS — Z7984 Long term (current) use of oral hypoglycemic drugs: Secondary | ICD-10-CM

## 2020-08-26 DIAGNOSIS — E785 Hyperlipidemia, unspecified: Secondary | ICD-10-CM | POA: Diagnosis present

## 2020-08-26 DIAGNOSIS — Z89611 Acquired absence of right leg above knee: Secondary | ICD-10-CM

## 2020-08-26 DIAGNOSIS — I700 Atherosclerosis of aorta: Secondary | ICD-10-CM | POA: Diagnosis present

## 2020-08-26 DIAGNOSIS — Z9841 Cataract extraction status, right eye: Secondary | ICD-10-CM

## 2020-08-26 DIAGNOSIS — I5032 Chronic diastolic (congestive) heart failure: Secondary | ICD-10-CM | POA: Diagnosis present

## 2020-08-26 DIAGNOSIS — J189 Pneumonia, unspecified organism: Secondary | ICD-10-CM | POA: Diagnosis present

## 2020-08-26 DIAGNOSIS — J9601 Acute respiratory failure with hypoxia: Secondary | ICD-10-CM | POA: Diagnosis present

## 2020-08-26 DIAGNOSIS — J948 Other specified pleural conditions: Secondary | ICD-10-CM | POA: Diagnosis not present

## 2020-08-26 DIAGNOSIS — I739 Peripheral vascular disease, unspecified: Secondary | ICD-10-CM | POA: Diagnosis present

## 2020-08-26 DIAGNOSIS — Z9049 Acquired absence of other specified parts of digestive tract: Secondary | ICD-10-CM

## 2020-08-26 DIAGNOSIS — J90 Pleural effusion, not elsewhere classified: Secondary | ICD-10-CM

## 2020-08-26 DIAGNOSIS — M1990 Unspecified osteoarthritis, unspecified site: Secondary | ICD-10-CM | POA: Diagnosis present

## 2020-08-26 DIAGNOSIS — E1151 Type 2 diabetes mellitus with diabetic peripheral angiopathy without gangrene: Secondary | ICD-10-CM | POA: Diagnosis present

## 2020-08-26 DIAGNOSIS — Z20822 Contact with and (suspected) exposure to covid-19: Secondary | ICD-10-CM | POA: Diagnosis present

## 2020-08-26 DIAGNOSIS — S78111A Complete traumatic amputation at level between right hip and knee, initial encounter: Secondary | ICD-10-CM | POA: Diagnosis present

## 2020-08-26 DIAGNOSIS — Z833 Family history of diabetes mellitus: Secondary | ICD-10-CM

## 2020-08-26 DIAGNOSIS — G4730 Sleep apnea, unspecified: Secondary | ICD-10-CM | POA: Diagnosis present

## 2020-08-26 DIAGNOSIS — E119 Type 2 diabetes mellitus without complications: Secondary | ICD-10-CM

## 2020-08-26 DIAGNOSIS — E1142 Type 2 diabetes mellitus with diabetic polyneuropathy: Secondary | ICD-10-CM

## 2020-08-26 DIAGNOSIS — Z86010 Personal history of colonic polyps: Secondary | ICD-10-CM

## 2020-08-26 DIAGNOSIS — I130 Hypertensive heart and chronic kidney disease with heart failure and stage 1 through stage 4 chronic kidney disease, or unspecified chronic kidney disease: Secondary | ICD-10-CM | POA: Diagnosis present

## 2020-08-26 DIAGNOSIS — Z7901 Long term (current) use of anticoagulants: Secondary | ICD-10-CM

## 2020-08-26 DIAGNOSIS — E1122 Type 2 diabetes mellitus with diabetic chronic kidney disease: Secondary | ICD-10-CM | POA: Diagnosis present

## 2020-08-26 DIAGNOSIS — I2510 Atherosclerotic heart disease of native coronary artery without angina pectoris: Secondary | ICD-10-CM | POA: Diagnosis present

## 2020-08-26 DIAGNOSIS — G9341 Metabolic encephalopathy: Secondary | ICD-10-CM | POA: Diagnosis present

## 2020-08-26 DIAGNOSIS — L89152 Pressure ulcer of sacral region, stage 2: Secondary | ICD-10-CM | POA: Diagnosis present

## 2020-08-26 DIAGNOSIS — N1832 Chronic kidney disease, stage 3b: Secondary | ICD-10-CM | POA: Diagnosis present

## 2020-08-26 DIAGNOSIS — Z8616 Personal history of COVID-19: Secondary | ICD-10-CM

## 2020-08-26 DIAGNOSIS — N179 Acute kidney failure, unspecified: Secondary | ICD-10-CM | POA: Diagnosis present

## 2020-08-26 DIAGNOSIS — Z7982 Long term (current) use of aspirin: Secondary | ICD-10-CM

## 2020-08-26 DIAGNOSIS — Z79899 Other long term (current) drug therapy: Secondary | ICD-10-CM

## 2020-08-26 DIAGNOSIS — Z8249 Family history of ischemic heart disease and other diseases of the circulatory system: Secondary | ICD-10-CM

## 2020-08-26 LAB — CBC WITH DIFFERENTIAL/PLATELET
Abs Immature Granulocytes: 0.11 10*3/uL — ABNORMAL HIGH (ref 0.00–0.07)
Basophils Absolute: 0.1 10*3/uL (ref 0.0–0.1)
Basophils Relative: 0 %
Eosinophils Absolute: 0.2 10*3/uL (ref 0.0–0.5)
Eosinophils Relative: 1 %
HCT: 21.2 % — ABNORMAL LOW (ref 39.0–52.0)
Hemoglobin: 7 g/dL — ABNORMAL LOW (ref 13.0–17.0)
Immature Granulocytes: 1 %
Lymphocytes Relative: 8 %
Lymphs Abs: 1.4 10*3/uL (ref 0.7–4.0)
MCH: 28.5 pg (ref 26.0–34.0)
MCHC: 33 g/dL (ref 30.0–36.0)
MCV: 86.2 fL (ref 80.0–100.0)
Monocytes Absolute: 1.8 10*3/uL — ABNORMAL HIGH (ref 0.1–1.0)
Monocytes Relative: 10 %
Neutro Abs: 13.7 10*3/uL — ABNORMAL HIGH (ref 1.7–7.7)
Neutrophils Relative %: 80 %
Platelets: 237 10*3/uL (ref 150–400)
RBC: 2.46 MIL/uL — ABNORMAL LOW (ref 4.22–5.81)
RDW: 14.6 % (ref 11.5–15.5)
WBC: 17.3 10*3/uL — ABNORMAL HIGH (ref 4.0–10.5)
nRBC: 0 % (ref 0.0–0.2)

## 2020-08-26 MED ORDER — ACETAMINOPHEN 500 MG PO TABS
1000.0000 mg | ORAL_TABLET | Freq: Once | ORAL | Status: AC
  Administered 2020-08-26: 1000 mg via ORAL
  Filled 2020-08-26: qty 2

## 2020-08-26 NOTE — ED Provider Notes (Signed)
Medical screening examination/treatment/procedure(s) were conducted as a shared visit with non-physician practitioner(s) and myself.  I personally evaluated the patient during the encounter.  EKG Interpretation  Date/Time:  Sunday August 26 2020 22:11:02 EDT Ventricular Rate:  81 PR Interval:    QRS Duration: 90 QT Interval:  387 QTC Calculation: 450 R Axis:   49 Text Interpretation: Sinus rhythm Probable left atrial enlargement Borderline ST depression, lateral leads No significant change since last tracing Confirmed by Lorre Nick ([id number]) on 08/26/2020 11:69:29 PM   74 year old male who presents with worsening shortness of breath as well as cough.  Fever noted.  Recently admitted for Covid pneumonia.  X-ray shows worsening multifocal airspace disease.  Likely bacterial superinfection.  Labs are pending patient will likely require admission   Lorre Nick, MD 08/26/20 2317

## 2020-08-26 NOTE — ED Provider Notes (Signed)
[facility]-EMERGENCY DEPT Provider Note   CSN: [id number] Arrival date & time: 08/26/20  2159     History Chief Complaint  Patient presents with   Fever   Shortness of Breath    Jeremiah [name] is a 74 y.o. male.  The history is provided by the patient and medical records.  Fever Associated symptoms: cough   Shortness of Breath Associated symptoms: cough and fever    74 year old male with history of congestive heart failure, hyperlipidemia, hypertension, gout, peripheral vascular disease, sleep apnea, diabetes, diagnosed COVID-19 positive on 08/09/2020 with subsequent hospitalization and discharged on 08/21/2020.  Hospitalization was complicated by pleural effusion requiring right pigtail catheter.  Evaluated by pulmonology not recommended for further outpatient work-up.  He was not discharged home on oxygen therapy.  Currently at [facility], noted to have new hypoxia and fever today.  O2 sats were 77 on room air with EMS, placed on 4 L and now maintaining at 99%.  He denies cough but is actively coughing during exam.  He does feel warm to the touch.  Denies chest pain.    Past Medical History:  Diagnosis Date   Amputee    Aortic atherosclerosis (HCC)    Arthritis    "joints; shoulders, knees, hands, back" (05/21/2018)   Atherosclerosis of coronary artery    C. difficile diarrhea 04/2018   Diastolic CHF (HCC)    Diverticulosis    High cholesterol    History of gout    Hypertension    IDA (iron deficiency anemia)    from referral Dr Darleene Cleaver   Internal hemorrhoids    Peripheral vascular disease (HCC)    Pneumonia    "couple times" (05/21/2018)   Sleep apnea    "has mask; won't use" (05/21/2018)   Type II diabetes mellitus [facility])     Patient Active Problem List   Diagnosis Date Noted   Acute respiratory failure with hypoxia (HCC)    Chest tube in place    Pneumonia due to COVID-19 virus 08/09/2020   Acute metabolic  encephalopathy 08/09/2020   Dysphagia 08/09/2020   Weakness    Acute lower UTI 06/25/2020   Abdominal pain    Gangrene (HCC) 06/18/2020   Post-operative pain 04/21/2019   Abnormality of gait 03/24/2019   Hypoglycemia    Hyperkalemia    Hyponatremia    Diabetic peripheral neuropathy (HCC)    Labile blood glucose    Enteritis due to Clostridium difficile    Blood glucose abnormal    Diarrhea    Benign essential HTN    Hypoalbuminemia due to protein-calorie malnutrition (HCC)    Labile blood pressure    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   Postoperative pain    Acute blood loss anemia    Dyslipidemia    Type 2 diabetes mellitus with peripheral neuropathy (HCC)    S/P AKA (above knee amputation) (HCC) 11/12/2018   Unilateral AKA, right (HCC)    Hypertensive crisis    Diabetes mellitus type 2 in nonobese (HCC)    Pleural effusion 11/08/2018   Fever    Open wound of right foot    Sepsis (HCC) 11/03/2018   Altered mental status    Acute cystitis without hematuria    Leukocytosis    Pressure injury of skin 07/08/2018   Clostridium difficile colitis 07/07/2018   Hypokalemia 07/07/2018   HLD (hyperlipidemia) 07/07/2018   Acute diverticulitis 06/04/2018   Cholecystitis 05/24/2018   Cholecystitis with cholelithiasis 05/21/2018   Acute systolic  heart failure (HCC) 03/27/2018   Diabetes mellitus without complication (HCC) 01/30/2018   Hypertension 01/30/2018   Acute cholecystitis 01/30/2018   AKI (acute kidney injury) (HCC) 01/30/2018   Normocytic anemia 01/30/2018   PVD (peripheral vascular disease) (HCC) 09/05/2014    Past Surgical History:  Procedure Laterality Date   ABDOMINAL AORTOGRAM W/LOWER EXTREMITY N/A 11/01/2018   Procedure: ABDOMINAL AORTOGRAM W/LOWER EXTREMITY;  Surgeon: Runell GessBerry, Jonathan J, MD;  Location: [facility] INV[facility] CV [facility];  Service: Cardiovascular;  Laterality: N/A;   ABDOMINAL AORTOGRAM W/LOWER EXTREMITY  Left 06/20/2020   Procedure: ABDOMINAL AORTOGRAM W/LOWER EXTREMITY;  Surgeon: Cephus Shellinglark, Christopher J, MD;  Location: [facility] CV [facility];  Service: Cardiovascular;  Laterality: Left;   AMPUTATION Right 11/09/2018   Procedure: RIGHT - AMPUTATION ABOVE KNEE;  Surgeon: Maeola Harmanain, Brandon Christopher, MD;  [facility];  Service: Vascular;  Laterality: Right;   CATARACT EXTRACTION W/ INTRAOCULAR LENS  IMPLANT, BILATERAL Bilateral    CHOLECYSTECTOMY  05/21/2018   ATTEMPTED LAPAROSCOPIC CHOLECYSTECTOMY, OPEN DRAINAGE OF GALLBLADDER WITH BIOPSY   CHOLECYSTECTOMY N/A 05/21/2018   Procedure: ATTEMPTED LAPAROSCOPIC CHOLECYSTECTOMY, OPEN DRAINAGE OF GALLBLADDER WITH BIOPSY;  Surgeon: Griselda Mineroth, Paul III, MD;  Location: [facility] OR;  Service: General;  Laterality: N/A;   COLONOSCOPY W/ POLYPECTOMY     COLONOSCOPY WITH PROPOFOL N/A 09/16/2018   Procedure: COLONOSCOPY WITH PROPOFOL;  Surgeon: Charlott RakesSchooler, Vincent, MD;  Location: [facility] ENDOSCOPY;  Service: Endoscopy;  Laterality: N/A;   FECAL TRANSPLANT N/A 09/16/2018   Procedure: FECAL TRANSPLANT;  Surgeon: Charlott RakesSchooler, Vincent, MD;  Location: [facility] ENDOSCOPY;  Service: Endoscopy;  Laterality: N/A;   FLEXIBLE SIGMOIDOSCOPY N/A 04/20/2020   Procedure: FLEXIBLE SIGMOIDOSCOPY;  Surgeon: Meridee ScoreMansouraty, Netty StarringGabriel Jr., MD;  [facility];  Service: Gastroenterology;  Laterality: N/A;  Fecal Disimpaction   IR CATHETER TUBE CHANGE  04/14/2018   IR CHOLANGIOGRAM EXISTING TUBE  03/17/2018   IR PERC CHOLECYSTOSTOMY  01/31/2018   IR RADIOLOGIST EVAL & MGMT  03/02/2018   PERIPHERAL VASCULAR INTERVENTION Left 06/20/2020   Procedure: PERIPHERAL VASCULAR INTERVENTION;  Surgeon: Cephus Shellinglark, Christopher J, MD;  Location: [facility];  Service: Cardiovascular;  Laterality: Left;  4 SFA STENTS       Family History  Problem Relation Age of Onset   Diabetes Mother    Hypertension Mother    Heart disease Father    Heart attack Father    Diabetes Sister    Hypertension Sister     Diabetes Brother    Heart disease Brother    Hypertension Brother    Heart attack Brother     Social History   Tobacco Use   Smoking status: Former Smoker    Years: 24.00    Types: Cigarettes    Quit date: 11/25/1983    Years since quitting: 36.7   Smokeless tobacco: Never Used  Vaping Use   Vaping Use: Never used  Substance Use Topics   Alcohol use: Not Currently   Drug use: No    Home Medications Prior to Admission medications   Medication Sig Start Date End Date Taking? Authorizing Provider  acetaminophen (TYLENOL) 500 MG tablet Take 1,000 mg by mouth every 6 (six) hours as needed for moderate pain or headache.    [provider]  amLODipine (NORVASC) 10 MG tablet Take 10 mg by mouth daily. 07/26/20   [provider]  amoxicillin-clavulanate (AUGMENTIN) 875-125 MG tablet Take 1 tablet by mouth every 12 (twelve) hours for 5 days. 08/21/20 08/26/20  Zigmund DanielPowell, A Caldwell Jr., MD  aspirin 81 MG  tablet Take 81 mg by mouth daily.    [provider]  atorvastatin (LIPITOR) 20 MG tablet Take 1 tablet (20 mg total) by mouth daily. 12/06/18   Angiulli, Mcarthur Rossetti, PA-C  brimonidine (ALPHAGAN) 0.2 % ophthalmic solution Place 1 drop into both eyes 2 (two) times daily. 12/06/18   Angiulli, Mcarthur Rossetti, PA-C  clopidogrel (PLAVIX) 75 MG tablet Take 1 tablet (75 mg total) by mouth daily with breakfast. 06/22/20   Rhetta Mura, MD  ferrous sulfate 325 (65 FE) MG tablet Take 1 tablet (325 mg total) by mouth 2 (two) times daily with a meal. 06/28/20   Zannie Cove, MD  gabapentin (NEURONTIN) 300 MG capsule Take 1 capsule (300 mg total) by mouth 2 (two) times daily. 06/28/20   Zannie Cove, MD  glimepiride (AMARYL) 1 MG tablet Take 1 tablet (1 mg total) by mouth daily with breakfast. 12/06/18   Angiulli, Mcarthur Rossetti, PA-C  glucose blood test strip Accu-Chek Aviva Plus test strips  Take 1 strip 3 times a day by miscell. route for 90 days.    [provider]    glucose blood test strip Accu-Chek Aviva Plus test strips  USE AS DIRECTED THREE TIMES DAILY.    [provider]  hydrALAZINE (APRESOLINE) 100 MG tablet Take 0.5 tablets (50 mg total) by mouth every 8 (eight) hours. Patient taking differently: Take 50 mg by mouth 2 (two) times daily.  06/28/20   Zannie Cove, MD  metoprolol succinate (TOPROL-XL) 25 MG 24 hr tablet Take 0.5 tablets (12.5 mg total) by mouth daily. 08/21/20 09/20/20  Zigmund Daniel., MD  pantoprazole (PROTONIX) 40 MG tablet Take 1 tablet (40 mg total) by mouth daily. 08/21/20 09/20/20  Zigmund Daniel., MD  timolol (TIMOPTIC) 0.5 % ophthalmic solution Place 1 drop into both eyes 2 (two) times daily. 12/06/18   Angiulli, Mcarthur Rossetti, PA-C  traMADol (ULTRAM) 50 MG tablet Take 50 mg by mouth every 8 (eight) hours as needed for moderate pain.     [provider]  vitamin B-12 1000 MCG tablet Take 1 tablet (1,000 mcg total) by mouth daily. 08/21/20 09/20/20  Zigmund Daniel., MD    Allergies    Patient has no known allergies.  Review of Systems   Review of Systems  Constitutional: Positive for fever.  Respiratory: Positive for cough and shortness of breath.   All other systems reviewed and are negative.   Physical Exam Updated Vital Signs BP 114/65 (BP Location: Left Arm)    Pulse 80    Temp (!) 101 F (38.3 C) (Rectal)    Resp (!) 21    SpO2 94%   Physical Exam Vitals and nursing note reviewed.  Constitutional:      Appearance: He is well-developed.     Comments: Elderly, warmth to the touch  HENT:     Head: Normocephalic and atraumatic.  Eyes:     Conjunctiva/sclera: Conjunctivae normal.     Pupils: Pupils are equal, round, and reactive to light.  Cardiovascular:     Rate and Rhythm: Normal rate and regular rhythm.     Heart sounds: Normal heart sounds.  Pulmonary:     Effort: Pulmonary effort is normal.     Breath sounds: Rhonchi present. No wheezing.     Comments: 4L O2 via [place] in  use, NAD, wet cough noted during exam, scattered rhonchi and coarse breath sounds Abdominal:     General: Bowel sounds are normal.  Palpations: Abdomen is soft.  Musculoskeletal:        General: Normal range of motion.     Cervical back: Normal range of motion.  Skin:    General: Skin is warm and dry.  Neurological:     Mental Status: He is alert and oriented to person, place, and time.     ED Results / Procedures / Treatments   Labs (all labs ordered are listed, but only abnormal results are displayed) Labs Reviewed  CBC WITH DIFFERENTIAL/PLATELET - Abnormal; Notable for the following components:      Result Value   WBC 17.3 (*)    RBC 2.46 (*)    Hemoglobin 7.0 (*)    HCT 21.2 (*)    Neutro Abs 13.7 (*)    Monocytes Absolute 1.8 (*)    Abs Immature Granulocytes 0.11 (*)    All other components within normal limits  COMPREHENSIVE METABOLIC PANEL - Abnormal; Notable for the following components:   Glucose, Bld 131 (*)    BUN 30 (*)    Creatinine, Ser 1.70 (*)    Calcium 8.8 (*)    Albumin 2.3 (*)    AST 13 (*)    GFR calc non Af Amer 38 (*)    GFR calc Af Amer 44 (*)    All other components within normal limits  D-DIMER, QUANTITATIVE (NOT AT [facility]) - Abnormal; Notable for the following components:   D-Dimer, Quant 2.50 (*)    All other components within normal limits  FERRITIN - Abnormal; Notable for the following components:   Ferritin 595 (*)    All other components within normal limits  FIBRINOGEN - Abnormal; Notable for the following components:   Fibrinogen >800 (*)    All other components within normal limits  C-REACTIVE PROTEIN - Abnormal; Notable for the following components:   CRP 18.8 (*)    All other components within normal limits  CULTURE, BLOOD (ROUTINE X 2)  CULTURE, BLOOD (ROUTINE X 2)  RESPIRATORY PANEL BY PCR  LACTIC ACID, PLASMA  PROCALCITONIN  LACTATE DEHYDROGENASE  TRIGLYCERIDES  LACTIC ACID, PLASMA    EKG EKG  Interpretation  Date/Time:  Sunday August 26 2020 22:11:02 EDT Ventricular Rate:  81 PR Interval:    QRS Duration: 90 QT Interval:  387 QTC Calculation: 450 R Axis:   49 Text Interpretation: Sinus rhythm Probable left atrial enlargement Borderline ST depression, lateral leads No significant change since last tracing Confirmed by Lorre Nick ([id number]) on 08/26/2020 11:16:15 PM   Radiology DG Chest Port 1 View  Result Date: 08/26/2020 CLINICAL DATA:  COVID pneumonia. EXAM: PORTABLE CHEST 1 VIEW COMPARISON:  08/20/2020 FINDINGS: There are worsening bilateral hazy airspace opacities. There are growing bilateral pleural effusions with a persistent right-sided hydropneumothorax. No evidence for left-sided pneumothorax. The heart size remains stable. There is no acute osseous abnormality. Atherosclerotic changes are again noted. IMPRESSION: 1. Worsening multifocal airspace opacities. 2. Persistent right-sided hydropneumothorax. 3. Worsening bilateral pleural effusions. Electronically Signed   By: Katherine Mantle M.D.   On: 08/26/2020 22:51    Procedures Procedures (including critical care time)  CRITICAL CARE Performed by: Garlon Hatchet   Total critical care time: 45 minutes  Critical care time was exclusive of separately billable procedures and treating other patients.  Critical care was necessary to treat or prevent imminent or life-threatening deterioration.  Critical care was time spent personally by me on the following activities: development of treatment plan with patient and/or surrogate as well as nursing,  discussions with consultants, evaluation of patient's response to treatment, examination of patient, obtaining history from patient or surrogate, ordering and performing treatments and interventions, ordering and review of laboratory studies, ordering and review of radiographic studies, pulse oximetry and re-evaluation of patient's condition.   Medications Ordered in  ED Medications  cefTRIAXone (ROCEPHIN) 1 g in sodium chloride 0.9 % 100 mL IVPB (has no administration in time range)  azithromycin (ZITHROMAX) 500 mg in sodium chloride 0.9 % 250 mL IVPB (has no administration in time range)  vancomycin (VANCOCIN) IVPB 1000 mg/200 mL premix (has no administration in time range)  acetaminophen (TYLENOL) tablet 1,000 mg (1,000 mg Oral Given 08/26/20 2339)    ED Course  I have reviewed the triage vital signs and the nursing notes.  Pertinent labs & imaging results that were available during my care of the patient were reviewed by me and considered in my medical decision making (see chart for details).    MDM Rules/Calculators/A&P  74 y.o. M here with hypoxia and recurrent fever.  Recent admission for covid-19 08/09/20 to 08/21/20, complicated by right-sided hydropneumothorax and bilateral effusions that required right pigtail catheter.  He also had anemia which required transfusion prior to discharge.  He is febrile here but overall nontoxic.  He is actively coughing.  He is requiring 4 L supplemental oxygen, was not discharged home from hospital on any O2.  He is warm to the touch and appears weak.  Labs pending including RVP to rule out other possible viral process. Tylenol given for fever.  Labs as above, does have leukocytosis and elevated inflammatory markers. Normal lactate.  Blood culture pending along with RVP.   CXR with worsening opacities, persistent right sided hydropneumothorax, and bilateral pleural effusions.  Possible worsening infiltrates from covid VS superimposed bacterial infection.  Will start abx and admit for ongoing care.  Discussed with hospitalist, Dr. Toniann Fail-- will admit for ongoing care.  Final Clinical Impression(s) / ED Diagnoses Final diagnoses:  Acute respiratory failure with hypoxia (HCC)  Pleural effusion, bilateral  Hydropneumothorax    Rx / DC Orders ED Discharge Orders    None       Garlon Hatchet,  PA-C 08/27/20 0110    Lorre Nick, MD 08/29/20 [phone]

## 2020-08-26 NOTE — ED Triage Notes (Signed)
Pt came in via [facility] with c/o hypoxia and fever. Pt resides at [facility], and his saturations were checked by nurse and noted to be in the high 70s. On arrival EMS states his saturations were in 80s on RA. Placed him on 4L and he is saturating in the 90s. Room air sats checked here and 77%. Placed on 4L and currently at 96%. Temp is 101 rectal

## 2020-08-27 ENCOUNTER — Other Ambulatory Visit: Payer: Self-pay

## 2020-08-27 ENCOUNTER — Encounter (HOSPITAL_COMMUNITY): Payer: Self-pay | Admitting: Internal Medicine

## 2020-08-27 ENCOUNTER — Inpatient Hospital Stay (HOSPITAL_COMMUNITY): Payer: Medicare Other

## 2020-08-27 DIAGNOSIS — E7800 Pure hypercholesterolemia, unspecified: Secondary | ICD-10-CM | POA: Diagnosis present

## 2020-08-27 DIAGNOSIS — G9341 Metabolic encephalopathy: Secondary | ICD-10-CM | POA: Diagnosis present

## 2020-08-27 DIAGNOSIS — I130 Hypertensive heart and chronic kidney disease with heart failure and stage 1 through stage 4 chronic kidney disease, or unspecified chronic kidney disease: Secondary | ICD-10-CM | POA: Diagnosis present

## 2020-08-27 DIAGNOSIS — N179 Acute kidney failure, unspecified: Secondary | ICD-10-CM | POA: Diagnosis present

## 2020-08-27 DIAGNOSIS — E119 Type 2 diabetes mellitus without complications: Secondary | ICD-10-CM

## 2020-08-27 DIAGNOSIS — G4730 Sleep apnea, unspecified: Secondary | ICD-10-CM | POA: Diagnosis present

## 2020-08-27 DIAGNOSIS — I10 Essential (primary) hypertension: Secondary | ICD-10-CM | POA: Diagnosis not present

## 2020-08-27 DIAGNOSIS — Z8616 Personal history of COVID-19: Secondary | ICD-10-CM | POA: Diagnosis not present

## 2020-08-27 DIAGNOSIS — N1832 Chronic kidney disease, stage 3b: Secondary | ICD-10-CM | POA: Diagnosis present

## 2020-08-27 DIAGNOSIS — Z89611 Acquired absence of right leg above knee: Secondary | ICD-10-CM | POA: Diagnosis not present

## 2020-08-27 DIAGNOSIS — I2510 Atherosclerotic heart disease of native coronary artery without angina pectoris: Secondary | ICD-10-CM | POA: Diagnosis not present

## 2020-08-27 DIAGNOSIS — E1151 Type 2 diabetes mellitus with diabetic peripheral angiopathy without gangrene: Secondary | ICD-10-CM | POA: Diagnosis present

## 2020-08-27 DIAGNOSIS — I700 Atherosclerosis of aorta: Secondary | ICD-10-CM | POA: Diagnosis present

## 2020-08-27 DIAGNOSIS — J9601 Acute respiratory failure with hypoxia: Secondary | ICD-10-CM

## 2020-08-27 DIAGNOSIS — J189 Pneumonia, unspecified organism: Principal | ICD-10-CM | POA: Diagnosis present

## 2020-08-27 DIAGNOSIS — E1122 Type 2 diabetes mellitus with diabetic chronic kidney disease: Secondary | ICD-10-CM | POA: Diagnosis present

## 2020-08-27 DIAGNOSIS — J948 Other specified pleural conditions: Secondary | ICD-10-CM | POA: Diagnosis present

## 2020-08-27 DIAGNOSIS — Z20822 Contact with and (suspected) exposure to covid-19: Secondary | ICD-10-CM | POA: Diagnosis present

## 2020-08-27 DIAGNOSIS — E785 Hyperlipidemia, unspecified: Secondary | ICD-10-CM | POA: Diagnosis present

## 2020-08-27 DIAGNOSIS — Z9842 Cataract extraction status, left eye: Secondary | ICD-10-CM | POA: Diagnosis not present

## 2020-08-27 DIAGNOSIS — J90 Pleural effusion, not elsewhere classified: Secondary | ICD-10-CM | POA: Diagnosis not present

## 2020-08-27 DIAGNOSIS — M1990 Unspecified osteoarthritis, unspecified site: Secondary | ICD-10-CM | POA: Diagnosis present

## 2020-08-27 DIAGNOSIS — I5032 Chronic diastolic (congestive) heart failure: Secondary | ICD-10-CM | POA: Diagnosis present

## 2020-08-27 DIAGNOSIS — L89152 Pressure ulcer of sacral region, stage 2: Secondary | ICD-10-CM | POA: Diagnosis present

## 2020-08-27 DIAGNOSIS — L89626 Pressure-induced deep tissue damage of left heel: Secondary | ICD-10-CM | POA: Diagnosis present

## 2020-08-27 DIAGNOSIS — Z833 Family history of diabetes mellitus: Secondary | ICD-10-CM | POA: Diagnosis not present

## 2020-08-27 DIAGNOSIS — Z961 Presence of intraocular lens: Secondary | ICD-10-CM | POA: Diagnosis present

## 2020-08-27 DIAGNOSIS — D638 Anemia in other chronic diseases classified elsewhere: Secondary | ICD-10-CM | POA: Diagnosis present

## 2020-08-27 LAB — BASIC METABOLIC PANEL
Anion gap: 10 (ref 5–15)
BUN: 32 mg/dL — ABNORMAL HIGH (ref 8–23)
CO2: 23 mmol/L (ref 22–32)
Calcium: 8.1 mg/dL — ABNORMAL LOW (ref 8.9–10.3)
Chloride: 101 mmol/L (ref 98–111)
Creatinine, Ser: 1.67 mg/dL — ABNORMAL HIGH (ref 0.61–1.24)
GFR calc Af Amer: 45 mL/min — ABNORMAL LOW (ref >60)
GFR calc non Af Amer: 39 mL/min — ABNORMAL LOW (ref >60)
Glucose, Bld: 135 mg/dL — ABNORMAL HIGH (ref 70–99)
Potassium: 4 mmol/L (ref 3.5–5.1)
Sodium: 134 mmol/L — ABNORMAL LOW (ref 135–145)

## 2020-08-27 LAB — MRSA PCR SCREENING: MRSA by PCR: NEGATIVE

## 2020-08-27 LAB — GLUCOSE, CAPILLARY
Glucose-Capillary: 130 mg/dL — ABNORMAL HIGH (ref 70–99)
Glucose-Capillary: 127 mg/dL — ABNORMAL HIGH (ref 70–99)

## 2020-08-27 LAB — CBC
HCT: 20.2 % — ABNORMAL LOW (ref 39.0–52.0)
Hemoglobin: 6.6 g/dL — CL (ref 13.0–17.0)
MCH: 28.4 pg (ref 26.0–34.0)
MCHC: 32.7 g/dL (ref 30.0–36.0)
MCV: 87.1 fL (ref 80.0–100.0)
Platelets: 220 10*3/uL (ref 150–400)
RBC: 2.32 MIL/uL — ABNORMAL LOW (ref 4.22–5.81)
RDW: 14.6 % (ref 11.5–15.5)
WBC: 13.8 10*3/uL — ABNORMAL HIGH (ref 4.0–10.5)
nRBC: 0 % (ref 0.0–0.2)

## 2020-08-27 LAB — FIBRINOGEN: Fibrinogen: >800 mg/dL — ABNORMAL HIGH (ref 210–475)

## 2020-08-27 LAB — FERRITIN: Ferritin: 595 ng/mL — ABNORMAL HIGH (ref 24–336)

## 2020-08-27 LAB — RESPIRATORY PANEL BY PCR

## 2020-08-27 LAB — TRIGLYCERIDES: Triglycerides: 74 mg/dL (ref <150)

## 2020-08-27 LAB — PROCALCITONIN: Procalcitonin: 0.12 ng/mL

## 2020-08-27 LAB — COMPREHENSIVE METABOLIC PANEL
ALT: 14 U/L (ref 0–44)
AST: 13 U/L — ABNORMAL LOW (ref 15–41)
Albumin: 2.3 g/dL — ABNORMAL LOW (ref 3.5–5.0)
Alkaline Phosphatase: 54 U/L (ref 38–126)
Anion gap: 13 (ref 5–15)
BUN: 30 mg/dL — ABNORMAL HIGH (ref 8–23)
CO2: 23 mmol/L (ref 22–32)
Calcium: 8.8 mg/dL — ABNORMAL LOW (ref 8.9–10.3)
Chloride: 101 mmol/L (ref 98–111)
Creatinine, Ser: 1.7 mg/dL — ABNORMAL HIGH (ref 0.61–1.24)
GFR calc Af Amer: 44 mL/min — ABNORMAL LOW (ref >60)
GFR calc non Af Amer: 38 mL/min — ABNORMAL LOW (ref >60)
Glucose, Bld: 131 mg/dL — ABNORMAL HIGH (ref 70–99)
Potassium: 4.3 mmol/L (ref 3.5–5.1)
Sodium: 137 mmol/L (ref 135–145)
Total Bilirubin: 0.9 mg/dL (ref 0.3–1.2)
Total Protein: 6.5 g/dL (ref 6.5–8.1)

## 2020-08-27 LAB — HIV ANTIBODY (ROUTINE TESTING W REFLEX): HIV Screen 4th Generation wRfx: NONREACTIVE

## 2020-08-27 LAB — D-DIMER, QUANTITATIVE: D-Dimer, Quant: 2.5 ug/mL-FEU — ABNORMAL HIGH (ref 0.00–0.50)

## 2020-08-27 LAB — LACTATE DEHYDROGENASE: LDH: 126 U/L (ref 98–192)

## 2020-08-27 LAB — PREPARE RBC (CROSSMATCH)

## 2020-08-27 LAB — CBG MONITORING, ED
Glucose-Capillary: 91 mg/dL (ref 70–99)
Glucose-Capillary: 117 mg/dL — ABNORMAL HIGH (ref 70–99)

## 2020-08-27 LAB — C-REACTIVE PROTEIN: CRP: 18.8 mg/dL — ABNORMAL HIGH (ref <1.0)

## 2020-08-27 LAB — LACTIC ACID, PLASMA: Lactic Acid, Venous: 0.8 mmol/L (ref 0.5–1.9)

## 2020-08-27 MED ORDER — ASPIRIN 81 MG PO CHEW
81.0000 mg | CHEWABLE_TABLET | Freq: Every day | ORAL | Status: DC
  Administered 2020-08-27 – 2020-08-30 (×4): 81 mg via ORAL
  Filled 2020-08-27 (×4): qty 1

## 2020-08-27 MED ORDER — SODIUM CHLORIDE 0.9% IV SOLUTION: Freq: Once | INTRAVENOUS | Status: DC

## 2020-08-27 MED ORDER — ASCORBIC ACID 500 MG PO TABS
500.0000 mg | ORAL_TABLET | Freq: Every day | ORAL | Status: DC
  Administered 2020-08-27 – 2020-08-30 (×4): 500 mg via ORAL
  Filled 2020-08-27 (×4): qty 1

## 2020-08-27 MED ORDER — HEPARIN SODIUM (PORCINE) 5000 UNIT/ML IJ SOLN
5000.0000 [IU] | Freq: Three times a day (TID) | INTRAMUSCULAR | Status: DC
  Administered 2020-08-27 – 2020-08-30 (×11): 5000 [IU] via SUBCUTANEOUS
  Filled 2020-08-27 (×11): qty 1

## 2020-08-27 MED ORDER — AMLODIPINE BESYLATE 10 MG PO TABS
10.0000 mg | ORAL_TABLET | Freq: Every day | ORAL | Status: DC
  Administered 2020-08-27 – 2020-08-30 (×4): 10 mg via ORAL
  Filled 2020-08-27 (×2): qty 1
  Filled 2020-08-27: qty 2
  Filled 2020-08-27: qty 1

## 2020-08-27 MED ORDER — ONDANSETRON HCL 4 MG PO TABS: 4.0000 mg | ORAL_TABLET | Freq: Four times a day (QID) | ORAL | Status: DC | PRN

## 2020-08-27 MED ORDER — SODIUM CHLORIDE 0.9 % IV SOLN
1.0000 g | Freq: Once | INTRAVENOUS | Status: AC
  Administered 2020-08-27: 1 g via INTRAVENOUS
  Filled 2020-08-27: qty 10

## 2020-08-27 MED ORDER — SODIUM CHLORIDE 0.9 % IV SOLN
500.0000 mg | Freq: Once | INTRAVENOUS | Status: AC
  Administered 2020-08-27: 500 mg via INTRAVENOUS
  Filled 2020-08-27: qty 500

## 2020-08-27 MED ORDER — FERROUS SULFATE 325 (65 FE) MG PO TABS
325.0000 mg | ORAL_TABLET | Freq: Two times a day (BID) | ORAL | Status: DC
  Administered 2020-08-27 – 2020-08-30 (×7): 325 mg via ORAL
  Filled 2020-08-27 (×8): qty 1

## 2020-08-27 MED ORDER — ENSURE ENLIVE PO LIQD
237.0000 mL | Freq: Two times a day (BID) | ORAL | Status: DC
  Administered 2020-08-28 (×2): 237 mL via ORAL

## 2020-08-27 MED ORDER — ONDANSETRON HCL 4 MG/2ML IJ SOLN: 4.0000 mg | Freq: Four times a day (QID) | INTRAMUSCULAR | Status: DC | PRN

## 2020-08-27 MED ORDER — PANTOPRAZOLE SODIUM 40 MG PO TBEC
40.0000 mg | DELAYED_RELEASE_TABLET | Freq: Every day | ORAL | Status: DC
  Administered 2020-08-27 – 2020-08-30 (×4): 40 mg via ORAL
  Filled 2020-08-27 (×4): qty 1

## 2020-08-27 MED ORDER — ZINC SULFATE 220 (50 ZN) MG PO CAPS
220.0000 mg | ORAL_CAPSULE | Freq: Every day | ORAL | Status: DC
  Administered 2020-08-27 – 2020-08-30 (×4): 220 mg via ORAL
  Filled 2020-08-27 (×4): qty 1

## 2020-08-27 MED ORDER — ATORVASTATIN CALCIUM 10 MG PO TABS
20.0000 mg | ORAL_TABLET | Freq: Every day | ORAL | Status: DC
  Administered 2020-08-27 – 2020-08-30 (×4): 20 mg via ORAL
  Filled 2020-08-27 (×4): qty 2

## 2020-08-27 MED ORDER — METOPROLOL SUCCINATE ER 25 MG PO TB24
25.0000 mg | ORAL_TABLET | Freq: Every day | ORAL | Status: DC
  Administered 2020-08-27 – 2020-08-30 (×4): 25 mg via ORAL
  Filled 2020-08-27 (×4): qty 1

## 2020-08-27 MED ORDER — VANCOMYCIN HCL 500 MG/100ML IV SOLN: 500.0000 mg | Freq: Once | INTRAVENOUS | Status: DC

## 2020-08-27 MED ORDER — VANCOMYCIN HCL IN DEXTROSE 1-5 GM/200ML-% IV SOLN
1000.0000 mg | Freq: Once | INTRAVENOUS | Status: AC
  Administered 2020-08-27: 1000 mg via INTRAVENOUS
  Filled 2020-08-27: qty 200

## 2020-08-27 MED ORDER — HYDRALAZINE HCL 50 MG PO TABS
50.0000 mg | ORAL_TABLET | Freq: Two times a day (BID) | ORAL | Status: DC
  Administered 2020-08-27 – 2020-08-30 (×7): 50 mg via ORAL
  Filled 2020-08-27 (×7): qty 1

## 2020-08-27 MED ORDER — SODIUM CHLORIDE 0.9 % IV SOLN
2.0000 g | INTRAVENOUS | Status: DC
  Administered 2020-08-28 – 2020-08-29 (×2): 2 g via INTRAVENOUS
  Filled 2020-08-27 (×2): qty 20

## 2020-08-27 MED ORDER — ACETAMINOPHEN 325 MG PO TABS: 650.0000 mg | ORAL_TABLET | Freq: Four times a day (QID) | ORAL | Status: DC | PRN

## 2020-08-27 MED ORDER — VITAMIN B-12 1000 MCG PO TABS
1000.0000 ug | ORAL_TABLET | Freq: Every day | ORAL | Status: DC
  Administered 2020-08-27 – 2020-08-30 (×4): 1000 ug via ORAL
  Filled 2020-08-27 (×4): qty 1

## 2020-08-27 MED ORDER — CLOPIDOGREL BISULFATE 75 MG PO TABS
75.0000 mg | ORAL_TABLET | Freq: Every day | ORAL | Status: DC
  Administered 2020-08-28 – 2020-08-30 (×3): 75 mg via ORAL
  Filled 2020-08-27 (×4): qty 1

## 2020-08-27 MED ORDER — VANCOMYCIN HCL 750 MG/150ML IV SOLN
750.0000 mg | INTRAVENOUS | Status: DC
  Administered 2020-08-28 – 2020-08-29 (×2): 750 mg via INTRAVENOUS
  Filled 2020-08-27 (×2): qty 150

## 2020-08-27 MED ORDER — INSULIN ASPART 100 UNIT/ML ~~LOC~~ SOLN
0.0000 [IU] | Freq: Three times a day (TID) | SUBCUTANEOUS | Status: DC
  Administered 2020-08-27: 1 [IU] via SUBCUTANEOUS
  Administered 2020-08-28 (×2): 2 [IU] via SUBCUTANEOUS
  Administered 2020-08-29: 3 [IU] via SUBCUTANEOUS
  Administered 2020-08-29: 1 [IU] via SUBCUTANEOUS
  Administered 2020-08-29: 2 [IU] via SUBCUTANEOUS
  Administered 2020-08-30: 1 [IU] via SUBCUTANEOUS
  Filled 2020-08-27: qty 0.09

## 2020-08-27 MED ORDER — BRIMONIDINE TARTRATE 0.2 % OP SOLN
1.0000 [drp] | Freq: Two times a day (BID) | OPHTHALMIC | Status: DC
  Administered 2020-08-27 – 2020-08-30 (×6): 1 [drp] via OPHTHALMIC
  Filled 2020-08-27 (×2): qty 5

## 2020-08-27 MED ORDER — ACETAMINOPHEN 650 MG RE SUPP: 650.0000 mg | Freq: Four times a day (QID) | RECTAL | Status: DC | PRN

## 2020-08-27 MED ORDER — TIMOLOL MALEATE 0.5 % OP SOLN
1.0000 [drp] | Freq: Two times a day (BID) | OPHTHALMIC | Status: DC
  Administered 2020-08-27 – 2020-08-30 (×6): 1 [drp] via OPHTHALMIC
  Filled 2020-08-27 (×2): qty 5

## 2020-08-27 MED ORDER — SODIUM CHLORIDE 0.9 % IV SOLN
500.0000 mg | INTRAVENOUS | Status: DC
  Administered 2020-08-28 – 2020-08-29 (×2): 500 mg via INTRAVENOUS
  Filled 2020-08-27 (×3): qty 500

## 2020-08-27 NOTE — Consult Note (Signed)
WOC Nurse wound consult note Consultation was completed by review of records, images and assistance from the bedside nurse/clinical staff.   Reason for Consult: sacral wound; noted to have heel wound from previous admissions Last seen by [facility] nurse a week ago for same (sacral wound) Wound type: MASD with Stage 2 PI; left heel PI vs. Ischemic ulceration Per L. McNichol:  the sacrum is with both healed Stage 2 PI and partial thickness skin breakdown and surrounding MASD, specifically incontinence associated dermatitis Pressure Injury POA: Yes Measurement: see nursing flow sheet; will obtain with 2 person nursing skin assessment at the time of admission.  : Dressing procedure/placement/frequency: Skin care order set implemented appropriately; sacral foam dressing.  Moisture barrier cream PRN and after each episode of incontinence Offload heel wound with Prevalon boot (in place in the ED) Paint heel wound with betadine; allow to air dry daily.  Turn patient from side to side to offload sacrum.    Re consult if needed, will not follow at this time. Thanks  Sheria Rosello [employer], RN,CWOCN, CNS, CWON-AP [phone])

## 2020-08-27 NOTE — ED Notes (Signed)
As night progresses, pt getting more confused. Upon arrival he could state his name,DOB, president, and where he was. Upon further conversation, patient keeps asking the same questions over again and is asking repeatedly for [place] to call his family. I've informed him that has already been done.

## 2020-08-27 NOTE — Plan of Care (Signed)
  Problem: Respiratory: Goal: Will maintain a patent airway Outcome: Progressing   Problem: Health Behavior/Discharge Planning: Goal: Ability to manage health-related needs will improve Outcome: Progressing   Problem: Clinical Measurements: Goal: Diagnostic test results will improve Outcome: Progressing Goal: Respiratory complications will improve Outcome: Progressing   Problem: Safety: Goal: Ability to remain free from injury will improve Outcome: Progressing   Problem: Skin Integrity: Goal: Risk for impaired skin integrity will decrease Outcome: Progressing   Problem: Education: Goal: Knowledge of risk factors and measures for prevention of condition will improve Outcome: Not Progressing   Problem: Education: Goal: Knowledge of General Education information will improve Description: Including pain rating scale, medication(s)/side effects and non-pharmacologic comfort measures Outcome: Not Progressing   Problem: Activity: Goal: Risk for activity intolerance will decrease Outcome: Not Progressing

## 2020-08-27 NOTE — Progress Notes (Signed)
Pharmacy Antibiotic Note  CASMER YEPIZ is a 74 y.o. male admitted on 08/26/2020 with pneumonia.  Recently admitted for Covid pneumonia (dx 08/09/20).  Vancomycin 1gm IV x 1 ordered in the ED.  Pharmacy has been consulted for Vancomycin dosing.  Also on Ceftriaxone and Azithromycin per MD.  Plan: Vancomycin 750mg  IV q24h Follow renal function Check vancomycin trough level as needed     Temp (24hrs), Avg:100 F (37.8 C), Min:98.9 F (37.2 C), Max:101 F (38.3 C)  Recent Labs  Lab 08/20/20 1656 08/21/20 0507 08/26/20 2233  WBC 13.6* 20.2* 17.3*  CREATININE 1.25* 1.23 1.70*  LATICACIDVEN  --   --  0.8    Estimated Creatinine Clearance: 30.7 mL/min (A) (by C-G formula based on SCr of 1.7 mg/dL (H)).    No Known Allergies  Antimicrobials this admission: 10/4 Vanc >>   10/4 Ceftriaxone >>   10/4 Azithromycin >>  Dose adjustments this admission:    Microbiology results: 10/3 BCx:    Thank you for allowing pharmacy to be a part of this patient's care.  12/4, PharmD 08/27/2020 3:36 AM

## 2020-08-27 NOTE — Progress Notes (Signed)
Patient was seen and examined.  Admitted early morning hours by nighttime hospitalist.  See history and physical and assessment and agree with it.  In brief, gentleman with multiple issues, advanced comorbidities and debility, bedbound status with recent Covid, aspiration pneumonia, multiple issues with pleural effusions, empyema treated last week with antibiotics and pigtail catheter and discharged to nursing home on oral antibiotics comes back with fever and hypoxia, 70% on room air requiring 4 L of oxygen.  In the emergency room on 4 L, slight confusion.  Temperature 101.  WBC count 17.3.  Hemoglobin 7.  Apparently, patient has right BKA, recently underwent left SFA stenting for chronic ischemic leg.   Plan: Admitted as recurrent pneumonia with bilateral complicated pleural effusion, loculated in a patient with poor baseline health, immobility and poor participation to chest physiotherapy and expansion of the lungs. Agree with continuing antibiotics with ceftriaxone, azithromycin and vancomycin due to recent multiple hospitalization. He is about 3 weeks from COVID-19 diagnosis, will be out of isolation on 10/6. I discussed case with Dr. Delton Coombes from pulmonary who recommended consult to CT surgery. Case discussed with Dr. Cliffton Asters from CT surgery, patient not a great surgical candidate, however he would like to evaluate patient at [facility] so transfer requested. Patient is currently on minimum oxygen 1 to 2 L and comfortable without any respiratory distress.  Patient does have chronic anemia, recently stands on the left SFA on dual antiplatelet therapy with aspirin Plavix, Hemoccult negative, multiple recent blood transfusions.  Hemoglobin less than 7 and was given 1 unit of PRBC.  Would try to continue aspirin Plavix because of recent stent.  Patient's wife called to update about the transfer to [facility] [place] and likely CT surgery intervention but unknown how much . She was not able to  pick up the phone.

## 2020-08-27 NOTE — H&P (Addendum)
History and Physical    BRISTOL SOY TOI:[id number] DOB: 04-13-1946 DOA: 08/26/2020  PCP: Annita Brod, MD  Patient coming from: [facility].  Chief Complaint: Hypoxia and fever.  HPI: Jeremiah [name] is a 74 y.o. male with history of diabetes mellitus, hypertension, chronic kidney disease stage III, peripheral vascular disease, right AKA, chronic anemia who was recently admitted from September 16 and discharged on August 21 2020 after being admitted for respiratory failure secondary to Covid infection and also aspiration pneumonia with pleural effusion requiring thoracentesis and pigtail catheter placement which was subsequently removed and patient was discharged to skilled nursing facility.  Most of the history was obtained from the ER physician as patient appears confused and no family at this time.  Per the report patient became hypoxic febrile and was saturating around 70% requiring 4 L oxygen to maintain sats.  ED Course: In the ER patient was hypoxic requiring 4 L oxygen chest x-ray showing worsening infiltrates bilaterally with pleural effusion and persistent right-sided hydropneumothorax.  Patient was febrile with temperature 101 F labs were significant for WBC of 17.3 hemoglobin 7 creatinine 1.7 D-dimer 2.5 CRP 18.8.  Patient had blood cultures drawn and started on empiric antibiotics for pneumonia.  On exam patient appears confused but not in distress.  Review of Systems: As per HPI, rest all negative.   Past Medical History:  Diagnosis Date  . Amputee   . Aortic atherosclerosis (HCC)   . Arthritis    "joints; shoulders, knees, hands, back" (05/21/2018)  . Atherosclerosis of coronary artery   . C. difficile diarrhea 04/2018  . Diastolic CHF (HCC)   . Diverticulosis   . High cholesterol   . History of gout   . Hypertension   . IDA (iron deficiency anemia)    from referral Dr Darleene Cleaver  . Internal hemorrhoids   . Peripheral vascular disease (HCC)   . Pneumonia     "couple times" (05/21/2018)  . Sleep apnea    "has mask; won't use" (05/21/2018)  . Type II diabetes mellitus (HCC)     Past Surgical History:  Procedure Laterality Date  . ABDOMINAL AORTOGRAM W/LOWER EXTREMITY N/A 11/01/2018   Procedure: ABDOMINAL AORTOGRAM W/LOWER EXTREMITY;  Surgeon: Runell Gess, MD;  [facility]: [facility];  Service: Cardiovascular;  Laterality: N/A;  . ABDOMINAL AORTOGRAM W/LOWER EXTREMITY Left 06/20/2020   Procedure: ABDOMINAL AORTOGRAM W/LOWER EXTREMITY;  Surgeon: Cephus Shelling, MD;  Location: [facility];  Service: Cardiovascular;  Laterality: Left;  . AMPUTATION Right 11/09/2018   Procedure: RIGHT - AMPUTATION ABOVE KNEE;  Surgeon: Maeola Harman, MD;  Location: [facility] OR;  Service: Vascular;  Laterality: Right;  . CATARACT EXTRACTION W/ INTRAOCULAR LENS  IMPLANT, BILATERAL Bilateral   . CHOLECYSTECTOMY  05/21/2018   ATTEMPTED LAPAROSCOPIC CHOLECYSTECTOMY, OPEN DRAINAGE OF GALLBLADDER WITH BIOPSY  . CHOLECYSTECTOMY N/A 05/21/2018   Procedure: ATTEMPTED LAPAROSCOPIC CHOLECYSTECTOMY, OPEN DRAINAGE OF GALLBLADDER WITH BIOPSY;  Surgeon: Griselda Miner, MD;  Location: [facility] OR;  Service: General;  Laterality: N/A;  . COLONOSCOPY W/ POLYPECTOMY    . COLONOSCOPY WITH PROPOFOL N/A 09/16/2018   Procedure: COLONOSCOPY WITH PROPOFOL;  Surgeon: Charlott Rakes, MD;  Location: [facility] ENDOSCOPY;  Service: Endoscopy;  Laterality: N/A;  . 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 FLEXIBLE SIGMOIDOSCOPY N/A 04/20/2020   Procedure: FLEXIBLE SIGMOIDOSCOPY;  Surgeon: Lemar Lofty., MD;  Location:  [facility] ENDOSCOPY;  Service: Gastroenterology;  Laterality: N/A;  Fecal Disimpaction  . IR CATHETER TUBE CHANGE  04/14/2018  . IR CHOLANGIOGRAM EXISTING TUBE  03/17/2018  . IR PERC CHOLECYSTOSTOMY  01/31/2018  . IR RADIOLOGIST EVAL & MGMT   03/02/2018  . PERIPHERAL VASCULAR INTERVENTION Left 06/20/2020   Procedure: PERIPHERAL VASCULAR INTERVENTION;  Surgeon: Cephus Shellinglark, Christopher J, MD;  [facility];  Service: Cardiovascular;  Laterality: Left;  4 SFA STENTS     reports that he quit smoking about 36 years ago. His smoking use included cigarettes. He quit after 24.00 years of use. He has never used smokeless tobacco. He reports previous alcohol use. He reports that he does not use drugs.  No Known Allergies  Family History  Problem Relation Age of Onset  . Diabetes Mother   . Hypertension Mother   . Heart disease Father   . Heart attack Father   . Diabetes Sister   . Hypertension Sister   . Diabetes Brother   . Heart disease Brother   . Hypertension Brother   . Heart attack Brother     Prior to Admission medications   Medication Sig Start Date End Date Taking? Authorizing Provider  acetaminophen (TYLENOL) 500 MG tablet Take 1,000 mg by mouth every 6 (six) hours as needed for moderate pain or headache.   Yes [provider]  amLODipine (NORVASC) 10 MG tablet Take 10 mg by mouth daily. 07/26/20  Yes [provider]  Ascorbic Acid (VITAMIN C PO) Take 2 tablets by mouth daily.   Yes [provider]  aspirin 81 MG tablet Take 81 mg by mouth daily.   Yes [provider]  atorvastatin (LIPITOR) 20 MG tablet Take 1 tablet (20 mg total) by mouth daily. 12/06/18  Yes Angiulli, Mcarthur Rossettianiel J, PA-C  brimonidine (ALPHAGAN) 0.2 % ophthalmic solution Place 1 drop into both eyes 2 (two) times daily. 12/06/18  Yes Angiulli, Mcarthur Rossettianiel J, PA-C  ferrous sulfate 325 (65 FE) MG tablet Take 1 tablet (325 mg total) by mouth 2 (two) times daily with a meal. 06/28/20  Yes Zannie CoveJoseph, Preetha, MD  gabapentin (NEURONTIN) 300 MG capsule Take 1 capsule (300 mg total) by mouth 2 (two) times daily. 06/28/20  Yes Zannie CoveJoseph, Preetha, MD  glimepiride (AMARYL) 1 MG tablet Take 1 tablet (1 mg total) by mouth daily with breakfast.  12/06/18  Yes Angiulli, Mcarthur Rossettianiel J, PA-C  hydrALAZINE (APRESOLINE) 100 MG tablet Take 0.5 tablets (50 mg total) by mouth every 8 (eight) hours. Patient taking differently: Take 50 mg by mouth 2 (two) times daily.  06/28/20  Yes Zannie CoveJoseph, Preetha, MD  metoprolol succinate (TOPROL-XL) 25 MG 24 hr tablet Take 0.5 tablets (12.5 mg total) by mouth daily. Patient taking differently: Take 25 mg by mouth daily.  08/21/20 09/20/20 Yes Zigmund DanielPowell, A Caldwell Jr., MD  pantoprazole (PROTONIX) 40 MG tablet Take 1 tablet (40 mg total) by mouth daily. 08/21/20 09/20/20 Yes Zigmund DanielPowell, A Caldwell Jr., MD  timolol (TIMOPTIC) 0.5 % ophthalmic solution Place 1 drop into both eyes 2 (two) times daily. 12/06/18  Yes Angiulli, Mcarthur Rossettianiel J, PA-C  traMADol (ULTRAM) 50 MG tablet Take 50 mg by mouth every 8 (eight) hours as needed for moderate pain.    Yes [provider]  vitamin B-12 1000 MCG tablet Take 1 tablet (1,000 mcg total) by mouth daily. 08/21/20 09/20/20 Yes Zigmund DanielPowell, A Caldwell Jr., MD  ZINC SULFATE PO Take 1 tablet by mouth daily. Taking for 3 weeks. Stops 10/21  Yes [provider]  clopidogrel (PLAVIX) 75 MG tablet Take 1 tablet (75 mg total) by mouth daily with breakfast. 06/22/20   Rhetta Mura, MD  glucose blood test strip Accu-Chek Aviva Plus test strips  Take 1 strip 3 times a day by miscell. route for 90 days.    [provider]  glucose blood test strip Accu-Chek Aviva Plus test strips  USE AS DIRECTED THREE TIMES DAILY.    [provider]    Physical Exam: Constitutional: Moderately built and nourished. Vitals:   08/26/20 2300 08/27/20 0000 08/27/20 0100 08/27/20 0200  BP: 111/61 104/67 (!) 112/59 (!) 111/59  Pulse: 81 75 74 67  Resp: (!) 26 19 17 19   Temp:   98.9 F (37.2 C)   TempSrc:   Oral   SpO2: 96% 93% 95% 95%   Eyes: Anicteric no pallor. ENMT: No discharge from the ears eyes nose or mouth. Neck: No mass felt.  No neck rigidity. Respiratory: No rhonchi or  crepitations. Cardiovascular: S1-S2 heard. Abdomen: Soft nontender bowel sounds present. Musculoskeletal: Right AKA.  Left lower extremity has multiple wounds. Skin: Sacral wounds. Neurologic: Alert awake oriented to his name only.  There is confused moves all extremities. Psychiatric: Appears confused.   Labs on Admission: I have personally reviewed following labs and imaging studies  CBC: Recent Labs  Lab 08/20/20 1656 08/21/20 0507 08/26/20 2233  WBC 13.6* 20.2* 17.3*  NEUTROABS 8.6* 15.1* 13.7*  HGB 7.8* 7.7* 7.0*  HCT 23.8* 23.6* 21.2*  MCV 83.8 84.9 86.2  PLT 224 223 237   Basic Metabolic Panel: Recent Labs  Lab 08/20/20 1656 08/21/20 0507 08/26/20 2233  NA 138 137 137  K 4.0 3.8 4.3  CL 102 101 101  CO2 27 26 23   GLUCOSE 133* 128* 131*  BUN 20 15 30*  CREATININE 1.25* 1.23 1.70*  CALCIUM 8.2* 8.5* 8.8*  MG 1.9  --   --   PHOS 2.1*  --   --    GFR: Estimated Creatinine Clearance: 30.7 mL/min (A) (by C-G formula based on SCr of 1.7 mg/dL (H)). Liver Function Tests: Recent Labs  Lab 08/20/20 1656 08/21/20 0507 08/26/20 2233  AST 16 14* 13*  ALT 17 16 14   ALKPHOS 70 66 54  BILITOT 0.5 0.4 0.9  PROT 5.8* 5.9* 6.5  ALBUMIN 2.3* 2.2* 2.3*   No results for input(s): LIPASE, AMYLASE in the last 168 hours. No results for input(s): AMMONIA in the last 168 hours. Coagulation Profile: No results for input(s): INR, PROTIME in the last 168 hours. Cardiac Enzymes: No results for input(s): CKTOTAL, CKMB, CKMBINDEX, TROPONINI in the last 168 hours. BNP (last 3 results) No results for input(s): PROBNP in the last 8760 hours. HbA1C: No results for input(s): HGBA1C in the last 72 hours. CBG: Recent Labs  Lab 08/20/20 0728 08/20/20 1122 08/20/20 1603 08/20/20 1946 08/21/20 0640  GLUCAP 163* 199* 133* 105* 127*   Lipid Profile: Recent Labs    08/26/20 2233  TRIG 74   Thyroid Function Tests: No results for input(s): TSH, T4TOTAL, FREET4, T3FREE,  THYROIDAB in the last 72 hours. Anemia Panel: Recent Labs    08/26/20 2233  FERRITIN 595*   Urine analysis:    Component Value Date/Time   COLORURINE YELLOW 08/09/2020 2226   APPEARANCEUR CLEAR 08/09/2020 2226   LABSPEC 1.011 08/09/2020 2226   PHURINE 7.0 08/09/2020 2226   GLUCOSEU NEGATIVE 08/09/2020 2226   HGBUR SMALL (A) 08/09/2020 2226   BILIRUBINUR NEGATIVE 08/09/2020  2226   KETONESUR NEGATIVE 08/09/2020 2226   PROTEINUR >=300 (A) 08/09/2020 2226   UROBILINOGEN 0.2 01/09/2009 1532   NITRITE NEGATIVE 08/09/2020 2226   LEUKOCYTESUR NEGATIVE 08/09/2020 2226   Sepsis Labs: @LABRCNTIP (procalcitonin:4,lacticidven:4) )No results found for this or any previous visit (from the past 240 hour(s)).   Radiological Exams on Admission: DG Chest Port 1 View  Result Date: 08/26/2020 CLINICAL DATA:  COVID pneumonia. EXAM: PORTABLE CHEST 1 VIEW COMPARISON:  08/20/2020 FINDINGS: There are worsening bilateral hazy airspace opacities. There are growing bilateral pleural effusions with a persistent right-sided hydropneumothorax. No evidence for left-sided pneumothorax. The heart size remains stable. There is no acute osseous abnormality. Atherosclerotic changes are again noted. IMPRESSION: 1. Worsening multifocal airspace opacities. 2. Persistent right-sided hydropneumothorax. 3. Worsening bilateral pleural effusions. Electronically Signed   By: 08/22/2020 M.D.   On: 08/26/2020 22:51    EKG: Independently reviewed.  Normal sinus rhythm.  Assessment/Plan Principal Problem:   Acute respiratory failure with hypoxia (HCC) Active Problems:   PVD (peripheral vascular disease) (HCC)   AKI (acute kidney injury) (HCC)   Unilateral AKA, right (HCC)   Diabetes mellitus type 2 in nonobese (HCC)   Benign essential HTN   CAP (community acquired pneumonia)    1. Acute respiratory failure with hypoxia could be multifactorial however since patient is febrile with chest x-ray showing worsening  infiltrates we will treat this as pneumonia with ceftriaxone and Zithromax add vancomycin we will get a CT chest to further evaluate patient's pleural effusions.  Based on which we will have further plans. 2. Recent Covid pneumonia which was treated already. 3. Acute on chronic disease stage III creatinine is worsened from baseline.  Since patient has significant pleural effusion and I am awaiting patient's CAT scan as well as will avoid any fluids at this time.  Check BNP. 4. Peripheral vascular disease on statins and antiplatelet agents. 5. Hypertension on hydralazine beta-blockers and amlodipine. 6. Acute encephalopathy likely patient has been having these medical changes since last admission.  Not sure if patient has any component of dementia.  Will need to further discuss this with patient's family. 7. Diabetes mellitus type 2 we will keep patient on sliding scale coverage. 8. Chronic anemia hemoglobin appears to be at baseline. 9. Chronic sacral wounds for which wound team has been consulted.  Since patient has respiratory failure with worsening infiltrates will need close monitoring for any further worsening in inpatient status.  Addendum -discussed with patient's wife about patient's admission and his condition.  Also discussed with patient's wife about patient's falling hemoglobin and need for transfusion and she has agreed and given the concern for transfusion as patient is confused and cannot give consent.   DVT prophylaxis: Heparin. Code Status: Full code. Family Communication: Discussed with patient's wife. Disposition Plan: To be determined. Consults called: Wound team consult. Admission status: Inpatient.   10/26/2020 MD [facility] Pager [phone].  If 7PM-7AM, please contact night-coverage [email or web address] Password [email or web address]  08/27/2020, 3:11 AM

## 2020-08-27 NOTE — ED Notes (Signed)
Pt turned L side and changed. There is a wound on pt's L heel that was present on arrival to the ED. It is wrapped and a pillow boot is on it. Pt states that it is not painful

## 2020-08-27 NOTE — Consult Note (Signed)
I have placed a request via Secure Chat to Dr. Jerral Ralph requesting photos of the wound areas of concern to be placed in the EMR.   Amreen Raczkowski [facility], CNS, [employer] [phone]

## 2020-08-28 DIAGNOSIS — J90 Pleural effusion, not elsewhere classified: Secondary | ICD-10-CM

## 2020-08-28 LAB — TYPE AND SCREEN
ABO/RH(D): B POS
Antibody Screen: NEGATIVE
Unit division: 0

## 2020-08-28 LAB — CBC WITH DIFFERENTIAL/PLATELET
Abs Immature Granulocytes: 0.03 10*3/uL (ref 0.00–0.07)
Basophils Absolute: 0.1 10*3/uL (ref 0.0–0.1)
Basophils Relative: 1 %
Eosinophils Absolute: 0.3 10*3/uL (ref 0.0–0.5)
Eosinophils Relative: 3 %
HCT: 24.5 % — ABNORMAL LOW (ref 39.0–52.0)
Hemoglobin: 7.8 g/dL — ABNORMAL LOW (ref 13.0–17.0)
Immature Granulocytes: 0 %
Lymphocytes Relative: 13 %
Lymphs Abs: 1.3 10*3/uL (ref 0.7–4.0)
MCH: 27.1 pg (ref 26.0–34.0)
MCHC: 31.8 g/dL (ref 30.0–36.0)
MCV: 85.1 fL (ref 80.0–100.0)
Monocytes Absolute: 1.2 10*3/uL — ABNORMAL HIGH (ref 0.1–1.0)
Monocytes Relative: 12 %
Neutro Abs: 7 10*3/uL (ref 1.7–7.7)
Neutrophils Relative %: 71 %
Platelets: 220 10*3/uL (ref 150–400)
RBC: 2.88 MIL/uL — ABNORMAL LOW (ref 4.22–5.81)
RDW: 15.9 % — ABNORMAL HIGH (ref 11.5–15.5)
WBC: 9.9 10*3/uL (ref 4.0–10.5)
nRBC: 0 % (ref 0.0–0.2)

## 2020-08-28 LAB — GLUCOSE, CAPILLARY
Glucose-Capillary: 80 mg/dL (ref 70–99)
Glucose-Capillary: 151 mg/dL — ABNORMAL HIGH (ref 70–99)
Glucose-Capillary: 174 mg/dL — ABNORMAL HIGH (ref 70–99)
Glucose-Capillary: 200 mg/dL — ABNORMAL HIGH (ref 70–99)

## 2020-08-28 LAB — BASIC METABOLIC PANEL
Anion gap: 12 (ref 5–15)
BUN: 29 mg/dL — ABNORMAL HIGH (ref 8–23)
CO2: 21 mmol/L — ABNORMAL LOW (ref 22–32)
Calcium: 8.2 mg/dL — ABNORMAL LOW (ref 8.9–10.3)
Chloride: 103 mmol/L (ref 98–111)
Creatinine, Ser: 1.46 mg/dL — ABNORMAL HIGH (ref 0.61–1.24)
GFR calc Af Amer: 53 mL/min — ABNORMAL LOW (ref >60)
GFR calc non Af Amer: 46 mL/min — ABNORMAL LOW (ref >60)
Glucose, Bld: 85 mg/dL (ref 70–99)
Potassium: 4.1 mmol/L (ref 3.5–5.1)
Sodium: 136 mmol/L (ref 135–145)

## 2020-08-28 LAB — BPAM RBC
Blood Product Expiration Date: 202110222359
ISSUE DATE / TIME: 202110041015
Unit Type and Rh: 7300

## 2020-08-28 LAB — PHOSPHORUS: Phosphorus: 2.9 mg/dL (ref 2.5–4.6)

## 2020-08-28 LAB — MAGNESIUM: Magnesium: 2 mg/dL (ref 1.7–2.4)

## 2020-08-28 MED ORDER — ENSURE ENLIVE PO LIQD
237.0000 mL | Freq: Three times a day (TID) | ORAL | Status: DC
  Administered 2020-08-29 – 2020-08-30 (×4): 237 mL via ORAL

## 2020-08-28 MED ORDER — JUVEN PO PACK
1.0000 | PACK | Freq: Two times a day (BID) | ORAL | Status: DC
  Administered 2020-08-28 – 2020-08-30 (×5): 1 via ORAL
  Filled 2020-08-28 (×5): qty 1

## 2020-08-28 NOTE — Progress Notes (Signed)
Initial Nutrition Assessment  DOCUMENTATION CODES:   Not applicable  INTERVENTION:  Provide Ensure Enlive po TID, each supplement provides 350 kcal and 20 grams of protein.  Provide Juven BID, each packet provides 95 calories, 2.5 grams of protein (collagen) to aid in wound healing.   Encourage adequate PO intake.   NUTRITION DIAGNOSIS:   Increased nutrient needs related to acute illness (COVID PNA) as evidenced by estimated needs.  GOAL:   Patient will meet greater than or equal to 90% of their needs  MONITOR:   PO intake, Supplement acceptance, Skin, Weight trends, Labs, I & O's  REASON FOR ASSESSMENT:   Malnutrition Screening Tool    ASSESSMENT:   74 y.o. male with history of diabetes mellitus, hypertension, chronic kidney disease stage III, peripheral vascular disease, right AKA, chronic anemia with recent admission for COVID PNA. Pt tested COVID positive 9/16. Presents with acute respiratory failure with hypoxia/possible healthcare associated pneumonia  Pt unavailable during attempted time of contact. RD unable to obtain most recent nutrition history at this time. RD to order nutritional supplements to aid in caloric and protein needs as well as in healing. Unable to complete Nutrition-Focused physical exam at this time.   Labs and medications reviewed.   Diet Order:   Diet Order            Diet heart healthy/carb modified Room service appropriate? Yes; Fluid consistency: Thin; Fluid restriction: 1200 mL Fluid  Diet effective now                 EDUCATION NEEDS:   Not appropriate for education at this time  Skin:  Skin Assessment: Skin Integrity Issues: Skin Integrity Issues:: DTI, Stage II DTI: L heel Stage II: sacrum  Last BM:  PTA  Height:   Ht Readings from Last 1 Encounters:  08/10/20 5\' 9"  (1.753 m)    Weight:   Wt Readings from Last 1 Encounters:  08/21/20 58.7 kg    BMI:  There is no height or weight on file to calculate  BMI.  Estimated Nutritional Needs:   Kcal:  1950-2150  Protein:  95-105 grams  Fluid:  1.2 L/day  08/23/20, MS, RD, LDN RD pager number/after hours weekend pager number on Amion.

## 2020-08-28 NOTE — NC FL2 (Signed)
[place] MEDICAID FL2 LEVEL OF CARE SCREENING TOOL     IDENTIFICATION  Patient Name: Jeremiah [name] Birthdate: Jul 07, 1946 Sex: male Admission Date (Current Location): 08/26/2020  [facility] and [place] Number:  Producer, television/film/video and Address:  [facility], [address] [place], [place] [address]      Provider Number: [id number]  Attending Physician Name and Address:  Osvaldo Shipper, MD  Relative Name and Phone Number:  [name], son, [phone]    Current Level of Care: Hospital Recommended Level of Care: Skilled Nursing Facility Prior Approval Number:    Date Approved/Denied:   PASRR Number: [id number]  Discharge Plan: SNF    Current Diagnoses: Patient Active Problem List   Diagnosis Date Noted  . CAP (community acquired pneumonia) 08/27/2020  . Pneumonia 08/27/2020  . Acute respiratory failure with hypoxia (HCC)   . Chest tube in place   . Pneumonia due to COVID-19 virus 08/09/2020  . Acute metabolic encephalopathy 08/09/2020  . Dysphagia 08/09/2020  . Weakness   . Acute lower UTI 06/25/2020  . Abdominal pain   . Gangrene (HCC) 06/18/2020  . Post-operative pain 04/21/2019  . Abnormality of gait 03/24/2019  . Hypoglycemia   . Hyperkalemia   . Hyponatremia   . Diabetic peripheral neuropathy (HCC)   . Labile blood glucose   . Enteritis due to Clostridium difficile   . Blood glucose abnormal   . Diarrhea   . Benign essential HTN   . Hypoalbuminemia due to protein-calorie malnutrition (HCC)   . Labile blood pressure   . 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 Postoperative pain   . Acute blood loss anemia   . Dyslipidemia   . Type 2 diabetes mellitus with peripheral neuropathy (HCC)   . S/P AKA (above knee amputation) (HCC) 11/12/2018  . Unilateral AKA, right (HCC)   . Hypertensive crisis   . Diabetes mellitus type 2 in nonobese (HCC)   . Pleural effusion 11/08/2018  . Fever   . Open wound of right foot   . Sepsis  (HCC) 11/03/2018  . Altered mental status   . Acute cystitis without hematuria   . Leukocytosis   . Pressure injury of skin 07/08/2018  . Clostridium difficile colitis 07/07/2018  . Hypokalemia 07/07/2018  . HLD (hyperlipidemia) 07/07/2018  . Acute diverticulitis 06/04/2018  . Cholecystitis 05/24/2018  . Cholecystitis with cholelithiasis 05/21/2018  . Acute systolic heart failure (HCC) 03/27/2018  . Diabetes mellitus without complication (HCC) 01/30/2018  . Hypertension 01/30/2018  . Acute cholecystitis 01/30/2018  . AKI (acute kidney injury) (HCC) 01/30/2018  . Normocytic anemia 01/30/2018  . PVD (peripheral vascular disease) (HCC) 09/05/2014    Orientation RESPIRATION BLADDER Height & Weight     Self, Time, Situation, Place  Normal, O2 (1L) Incontinent, External catheter Weight:   Height:     BEHAVIORAL SYMPTOMS/MOOD NEUROLOGICAL BOWEL NUTRITION STATUS      Incontinent Diet (Please see DC Summary)  AMBULATORY STATUS COMMUNICATION OF NEEDS Skin   Extensive Assist Verbally PU Stage and Appropriate Care (Stg II on sacrum with foam dressing; wound on heel)                       Personal Care Assistance Level of Assistance  Bathing, Dressing, Feeding Bathing Assistance: Maximum assistance Feeding assistance: Limited assistance Dressing Assistance: Maximum assistance     Functional Limitations Info  Hearing   Hearing Info: Impaired      SPECIAL CARE FACTORS FREQUENCY  PT (By licensed PT), OT (By licensed OT)     PT Frequency: 5x/week OT Frequency: 5x/week            Contractures Contractures Info: Not present    Additional Factors Info  Code Status, Insulin Sliding Scale, Allergies Code Status Info: Full Allergies Info: NKA   Insulin Sliding Scale Info: see dc summary for dose       Current Medications (08/28/2020):  This is the current hospital active medication list Current Facility-Administered Medications  Medication Dose Route Frequency  Provider Last Rate Last Admin  . 0.9 %  sodium chloride infusion (Manually program via Guardrails IV Fluids)   Intravenous Once Eduard Clos, MD   Held at 08/27/20 [phone]  . acetaminophen (TYLENOL) tablet 650 mg  650 mg Oral Q6H PRN Eduard Clos, MD       Or  . acetaminophen (TYLENOL) suppository 650 mg  650 mg Rectal Q6H PRN Eduard Clos, MD      . amLODipine (NORVASC) tablet 10 mg  10 mg Oral Daily Eduard Clos, MD   10 mg at 08/28/20 0930  . ascorbic acid (VITAMIN C) tablet 500 mg  500 mg Oral Daily Eduard Clos, MD   500 mg at 08/28/20 0930  . aspirin chewable tablet 81 mg  81 mg Oral Daily Eduard Clos, MD   81 mg at 08/28/20 0930  . atorvastatin (LIPITOR) tablet 20 mg  20 mg Oral Daily Eduard Clos, MD   20 mg at 08/28/20 0930  . azithromycin (ZITHROMAX) 500 mg in sodium chloride 0.9 % 250 mL IVPB  500 mg Intravenous Q24H Eduard Clos, MD   Stopped at 08/28/20 0244  . brimonidine (ALPHAGAN) 0.2 % ophthalmic solution 1 drop  1 drop Both Eyes BID Eduard Clos, MD   1 drop at 08/28/20 6213  . cefTRIAXone (ROCEPHIN) 2 g in sodium chloride 0.9 % 100 mL IVPB  2 g Intravenous Q24H Eduard Clos, MD   Stopped at 08/28/20 0142  . clopidogrel (PLAVIX) tablet 75 mg  75 mg Oral Q breakfast Eduard Clos, MD   75 mg at 08/28/20 0931  . feeding supplement (ENSURE ENLIVE) (ENSURE ENLIVE) liquid 237 mL  237 mL Oral BID BM Maretta Bees, MD   237 mL at 08/28/20 0928  . ferrous sulfate tablet 325 mg  325 mg Oral BID WC Eduard Clos, MD   325 mg at 08/28/20 0930  . heparin injection 5,000 Units  5,000 Units Subcutaneous Q8H Eduard Clos, MD   5,000 Units at 08/28/20 0865  . hydrALAZINE (APRESOLINE) tablet 50 mg  50 mg Oral BID Eduard Clos, MD   50 mg at 08/28/20 0930  . insulin aspart (novoLOG) injection 0-9 Units  0-9 Units Subcutaneous TID WC Eduard Clos, MD   1 Units at 08/27/20 1730  .  metoprolol succinate (TOPROL-XL) 24 hr tablet 25 mg  25 mg Oral Daily Eduard Clos, MD   25 mg at 08/28/20 0930  . ondansetron (ZOFRAN) tablet 4 mg  4 mg Oral Q6H PRN Eduard Clos, MD       Or  . ondansetron [facility]) injection 4 mg  4 mg Intravenous Q6H PRN Eduard Clos, MD      . pantoprazole (PROTONIX) EC tablet 40 mg  40 mg Oral Daily Eduard Clos, MD   40 mg at 08/28/20 0930  . timolol (TIMOPTIC) 0.5 % ophthalmic solution 1  drop  1 drop Both Eyes BID Eduard Clos, MD   1 drop at 08/28/20 0936  . vancomycin (VANCOREADY) IVPB 750 mg/150 mL  750 mg Intravenous Q24H Terrilee Files T, RPH 150 mL/hr at 08/28/20 0302 Rate Verify at 08/28/20 0302  . vitamin B-12 (CYANOCOBALAMIN) tablet 1,000 mcg  1,000 mcg Oral Daily Eduard Clos, MD   1,000 mcg at 08/28/20 0930  . zinc sulfate capsule 220 mg  220 mg Oral Daily Eduard Clos, MD   220 mg at 08/28/20 0930     Discharge Medications: Please see discharge summary for a list of discharge medications.  Relevant Imaging Results:  Relevant Lab Results:   Additional Information SS#: [id number]. Coming off COVID isolation on 10/6  Mearl Latin, LCSW

## 2020-08-28 NOTE — Plan of Care (Signed)
  Problem: Education: Goal: Knowledge of risk factors and measures for prevention of condition will improve Outcome: Progressing   Problem: Respiratory: Goal: Will maintain a patent airway Outcome: Progressing   Problem: Education: Goal: Knowledge of General Education information will improve Description: Including pain rating scale, medication(s)/side effects and non-pharmacologic comfort measures Outcome: Progressing   Problem: Health Behavior/Discharge Planning: Goal: Ability to manage health-related needs will improve Outcome: Progressing   Problem: Clinical Measurements: Goal: Diagnostic test results will improve Outcome: Progressing Goal: Respiratory complications will improve Outcome: Progressing   Problem: Activity: Goal: Risk for activity intolerance will decrease Outcome: Progressing   Problem: Safety: Goal: Ability to remain free from injury will improve Outcome: Progressing   Problem: Skin Integrity: Goal: Risk for impaired skin integrity will decrease Outcome: Progressing   

## 2020-08-28 NOTE — Progress Notes (Signed)
     [address]       [place] [address]             [phone]       Consult received, full note to follow Bilateral complex fluid collections in the setting of multiple interventions Very deconditioned.  Will discuss surgical plans with patient and family.  Cheyane Ayon Keane Scrape

## 2020-08-28 NOTE — Progress Notes (Signed)
[facility] PROGRESS NOTE   Jeremiah [name] URK:[id number] DOB: 03-14-1946 DOA: 08/26/2020  PCP: Annita Brod, MD  Brief History/Interval Summary: 74 y.o. male with history of diabetes mellitus, hypertension, chronic kidney disease stage III, peripheral vascular disease, right AKA, chronic anemia who was recently admitted from September 16 and discharged on August 21 2020 after being admitted for respiratory failure secondary to Covid infection and also aspiration pneumonia with pleural effusion requiring thoracentesis and pigtail catheter placement which was subsequently removed and patient was discharged to skilled nursing facility.  Per the report patient became hypoxic febrile and was saturating around 70% requiring 4 L oxygen to maintain sats. In the ER patient was hypoxic requiring 4 L oxygen chest x-ray showing worsening infiltrates bilaterally with pleural effusion and persistent right-sided hydropneumothorax.  Patient was febrile with temperature 101 F labs were significant for WBC of 17.3 hemoglobin 7 creatinine 1.7 D-dimer 2.5 CRP 18.8.  Patient had blood cultures drawn and started on empiric antibiotics for pneumonia. Patient was hospitalized for further management.  Reason for Visit: Pneumonia, concern for healthcare associated. Pleural effusion  Consultants: Cardiothoracic surgery  Procedures: None yet  Antibiotics: Anti-infectives (From admission, onward)   Start     Dose/Rate Route Frequency Ordered Stop   08/28/20 0400  vancomycin (VANCOREADY) IVPB 750 mg/150 mL        750 mg 150 mL/hr over 60 Minutes Intravenous Every 24 hours 08/27/20 0341     08/28/20 0200  cefTRIAXone (ROCEPHIN) 2 g in sodium chloride 0.9 % 100 mL IVPB        2 g 200 mL/hr over 30 Minutes Intravenous Every 24 hours 08/27/20 0310 09/02/20 0159   08/28/20 0200  azithromycin (ZITHROMAX) 500 mg in sodium chloride 0.9 % 250 mL IVPB        500 mg 250 mL/hr over 60 Minutes Intravenous Every 24  hours 08/27/20 0310 09/02/20 0159   08/27/20 0100  vancomycin (VANCOCIN) IVPB 1000 mg/200 mL premix        1,000 mg 200 mL/hr over 60 Minutes Intravenous  Once 08/27/20 0048 08/27/20 0519   08/27/20 0045  cefTRIAXone (ROCEPHIN) 1 g in sodium chloride 0.9 % 100 mL IVPB        1 g 200 mL/hr over 30 Minutes Intravenous  Once 08/27/20 0041 08/27/20 0215   08/27/20 0045  azithromycin (ZITHROMAX) 500 mg in sodium chloride 0.9 % 250 mL IVPB        500 mg 250 mL/hr over 60 Minutes Intravenous  Once 08/27/20 0041 08/27/20 0320   08/27/20 0045  vancomycin (VANCOREADY) IVPB 500 mg/100 mL  Status:  Discontinued        500 mg 100 mL/hr over 60 Minutes Intravenous  Once 08/27/20 0041 08/27/20 0048      Subjective/Interval History: Patient noted to be mildly distracted. Denies any pain or shortness of breath currently. No nausea vomiting.    Assessment/Plan:  Acute respiratory failure with hypoxia/possible healthcare associated pneumonia Recently hospitalized for COVID-19 and was also found to have right-sided pleural effusion for which patient underwent thoracentesis as well as a pigtail catheter placement which was subsequently removed prior to discharge.  Patient was noted to be febrile at the time of admission. Hospitalized and placed on ceftriaxone azithromycin and vancomycin. WBC noted to be normal today. Patient currently on 2 L of oxygen saturating in the early nineties. CT chest is shows acute airspace disease with numerous small to moderate left-sided pleural effusion that appears to be loculated. Has  stable chronic right pleural collection with gas and debris secondary to recent drainage. Mild interstitial edema was noted. Cardiothoracic surgery was consulted who recommended [facility]. We await their input.  Recent COVID-19 pneumonia Patient has completed treatment for same. His current symptoms are not due to COVID-19. Tested positive on September 16. His 3 weeks will complete  on 10/6. On 10/7 he can come off of isolation.  Acute on chronic kidney disease stage IIIb Looks like his baseline creatinine is around 1.2. Presented with a creatinine of 1.7. Improved to 1.46 this morning. Monitor urine output. No interventions to be done at this time.  History of peripheral vascular disease Statin and antiplatelet agent to be continued. Underwent left lower extremity angiogram for critical limb ischemia in July. Stent was placed into the SFA. Continue Plavix.  Chronic anemia Hemoglobin was 7.7 in September. Came in with a hemoglobin of 7.0 dropped to 6.6. Was transfused. Improved to 7.8 this morning. No overt bleeding noted.  Essential hypertension Monitor blood pressures closely. On hydralazine beta-blockers and amlodipine.  Acute metabolic encephalopathy Unclear if the patient has a history of cognitive impairment. He is not delirious currently. Slow to respond at times. Seems to be mildly confused. No focal deficits noted. Continue to monitor.  Diabetes mellitus type 2 Noted to be on Amaryl prior to admission. Monitor CBGs. A1c was 6.0 in September.  Chronic sacral and heel wounds Wound care. Pressure Injury 08/27/20 Heel Left Deep Tissue Pressure Injury - Purple or maroon localized area of discolored intact skin or blood-filled blister due to damage of underlying soft tissue from pressure and/or shear. (Active)  08/27/20 1033  Location: Heel  Location Orientation: Left  Staging: Deep Tissue Pressure Injury - Purple or maroon localized area of discolored intact skin or blood-filled blister due to damage of underlying soft tissue from pressure and/or shear.  Wound Description (Comments):   Present on Admission: Yes     Pressure Injury 08/27/20 Sacrum Mid Stage 2 -  Partial thickness loss of dermis presenting as a shallow open injury with a red, pink wound bed without slough. (Active)  08/27/20 1033  Location: Sacrum  Location Orientation: Mid  Staging: Stage 2  -  Partial thickness loss of dermis presenting as a shallow open injury with a red, pink wound bed without slough.  Wound Description (Comments):   Present on Admission: Yes    DVT Prophylaxis: Subcutaneous heparin Code Status: Full code Family Communication: We'll update his wife later today Disposition Plan: Return to SNF when improved. PT and OT evaluation.  Status is: Inpatient  Remains inpatient appropriate because:IV treatments appropriate due to intensity of illness or inability to take PO and Inpatient level of care appropriate due to severity of illness   Dispo: The patient is from: SNF              Anticipated d/c is to: SNF              Anticipated d/c date is: 2 days              Patient currently is not medically stable to d/c.     Medications:  Scheduled: . sodium chloride   Intravenous Once  . amLODipine  10 mg Oral Daily  . vitamin C  500 mg Oral Daily  . aspirin  81 mg Oral Daily  . atorvastatin  20 mg Oral Daily  . brimonidine  1 drop Both Eyes BID  . clopidogrel  75 mg Oral  Q breakfast  . feeding supplement (ENSURE ENLIVE)  237 mL Oral BID BM  . ferrous sulfate  325 mg Oral BID WC  . heparin  5,000 Units Subcutaneous Q8H  . hydrALAZINE  50 mg Oral BID  . insulin aspart  0-9 Units Subcutaneous TID WC  . metoprolol succinate  25 mg Oral Daily  . pantoprazole  40 mg Oral Daily  . timolol  1 drop Both Eyes BID  . cyanocobalamin  1,000 mcg Oral Daily  . zinc sulfate  220 mg Oral Daily   Continuous: . azithromycin Stopped (08/28/20 0244)  . cefTRIAXone (ROCEPHIN)  IV Stopped (08/28/20 0142)  . vancomycin 150 mL/hr at 08/28/20 0302   ZOX:[id number] **OR** acetaminophen, ondansetron **OR** ondansetron (ZOFRAN) IV   Objective:  Vital Signs  Vitals:   08/28/20 0250 08/28/20 0429 08/28/20 0724 08/28/20 0930  BP:  (!) 120/54 (!) 118/52 132/63  Pulse:  71 66 76  Resp:  15 18   Temp:  98.7 F (37.1 C) 98 F (36.7 C)   TempSrc:  Oral Oral   SpO2:  100% 92% 100%     Intake/Output Summary (Last 24 hours) at 08/28/2020 1027 Last data filed at 08/28/2020 0302 Gross per 24 hour  Intake 376.31 ml  Output --  Net 376.31 ml   There were no vitals filed for this visit.  General appearance: Awake alert.  In no distress. Pleasantly confused Resp: Normal effort at rest. Coarse breath sounds with crackles bilateral bases. No wheezing or rhonchi. Cardio: S1-S2 is normal regular.  No S3-S4.  No rubs murmurs or bruit GI: Abdomen is soft.  Nontender nondistended.  Bowel sounds are present normal.  No masses organomegaly Extremities: No edema.  Full range of motion of lower extremities. Neurologic: No focal neurological deficits.    Lab Results:  Data Reviewed: I have personally reviewed following labs and imaging studies  CBC: Recent Labs  Lab 08/26/20 2233 08/27/20 0307 08/28/20 0258  WBC 17.3* 13.8* 9.9  NEUTROABS 13.7*  --  7.0  HGB 7.0* 6.6* 7.8*  HCT 21.2* 20.2* 24.5*  MCV 86.2 87.1 85.1  PLT 237 220 220    Basic Metabolic Panel: Recent Labs  Lab 08/26/20 2233 08/27/20 0307 08/28/20 0258  NA 137 134* 136  K 4.3 4.0 4.1  CL 101 101 103  CO2 23 23 21*  GLUCOSE 131* 135* 85  BUN 30* 32* 29*  CREATININE 1.70* 1.67* 1.46*  CALCIUM 8.8* 8.1* 8.2*  MG  --   --  2.0  PHOS  --   --  2.9    GFR: Estimated Creatinine Clearance: 35.7 mL/min (A) (by C-G formula based on SCr of 1.46 mg/dL (H)).  Liver Function Tests: Recent Labs  Lab 08/26/20 2233  AST 13*  ALT 14  ALKPHOS 54  BILITOT 0.9  PROT 6.5  ALBUMIN 2.3*     CBG: Recent Labs  Lab 08/27/20 0954 08/27/20 1248 08/27/20 1707 08/27/20 2030 08/28/20 0750  GLUCAP 91 117* 130* 127* 80    Lipid Profile: Recent Labs    08/26/20 2233  TRIG 74    Anemia Panel: Recent Labs    08/26/20 2233  FERRITIN 595*    Recent Results (from the past 240 hour(s))  Blood Culture (routine x 2)     Status: None (Preliminary result)   Collection Time: 08/26/20  10:33 PM   Specimen: BLOOD  Result Value Ref Range Status   Specimen Description   Final    BLOOD LEFT  WRIST Performed at [facility], [address] [place], [place] [address]    Special Requests   Final    BOTTLES DRAWN AEROBIC AND ANAEROBIC Blood Culture results may not be optimal due to an inadequate volume of blood received in culture bottles Performed at [facility], [address] [place], [place] [address]    Culture   Final    NO GROWTH 1 DAY Performed at [facility], [address] [place], [place] [address]    Report Status PENDING  Incomplete  Respiratory Panel by PCR     Status: None   Collection Time: 08/26/20 10:34 PM   Specimen: Nasopharyngeal Swab; Respiratory  Result Value Ref Range Status   Adenovirus NOT DETECTED NOT DETECTED Final   Coronavirus 229E NOT DETECTED NOT DETECTED Final    Comment: (NOTE) The Coronavirus on the Respiratory Panel, DOES NOT test for the novel  Coronavirus (2019 nCoV)    Coronavirus HKU1 NOT DETECTED NOT DETECTED Final   Coronavirus NL63 NOT DETECTED NOT DETECTED Final   Coronavirus OC43 NOT DETECTED NOT DETECTED Final   Metapneumovirus NOT DETECTED NOT DETECTED Final   Rhinovirus / Enterovirus NOT DETECTED NOT DETECTED Final   Influenza A NOT DETECTED NOT DETECTED Final   Influenza B NOT DETECTED NOT DETECTED Final   Parainfluenza Virus 1 NOT DETECTED NOT DETECTED Final   Parainfluenza Virus 2 NOT DETECTED NOT DETECTED Final   Parainfluenza Virus 3 NOT DETECTED NOT DETECTED Final   Parainfluenza Virus 4 NOT DETECTED NOT DETECTED Final   Respiratory Syncytial Virus NOT DETECTED NOT DETECTED Final   Bordetella pertussis NOT DETECTED NOT DETECTED Final   Chlamydophila pneumoniae NOT DETECTED NOT DETECTED Final   Mycoplasma pneumoniae NOT DETECTED NOT DETECTED Final    Comment: Performed at [facility], [address], [place], [place] [address]  Blood Culture (routine x 2)      Status: None (Preliminary result)   Collection Time: 08/26/20 10:38 PM   Specimen: BLOOD  Result Value Ref Range Status   Specimen Description   Final    BLOOD LEFT HAND Performed at [facility], [address] [place], [place] [address]    Special Requests   Final    BOTTLES DRAWN AEROBIC AND ANAEROBIC Blood Culture results may not be optimal due to an inadequate volume of blood received in culture bottles Performed at [facility], [address] [place], [place] [address]    Culture   Final    NO GROWTH 1 DAY Performed at [facility], [address], [place], [place] [address]    Report Status PENDING  Incomplete  MRSA PCR Screening     Status: None   Collection Time: 08/27/20 11:14 AM   Specimen: Nasopharyngeal  Result Value Ref Range Status   MRSA by PCR NEGATIVE NEGATIVE Final    Comment:        The GeneXpert MRSA Assay (FDA approved for NASAL specimens only), is one component of a comprehensive MRSA colonization surveillance program. It is not intended to diagnose MRSA infection nor to guide or monitor treatment for MRSA infections. Performed at [facility], [address] [place], [place] [address]       Radiology Studies: CT CHEST WO CONTRAST  Result Date: 08/27/2020 CLINICAL DATA:  Persistent cough EXAM: CT CHEST WITHOUT CONTRAST TECHNIQUE: Multidetector CT imaging of the chest was performed following the standard protocol without IV contrast.  COMPARISON:  08/19/2020 FINDINGS: Cardiovascular: Mildly enlarged heart. Aortic and coronary atherosclerosis. Mediastinum/Nodes: Large mediastinal lymph nodes, especially subcarinal, considered reactive given the extent of pulmonary findings. Prominent thickness of the mid to lower esophagus, not well assessed on this noncontrast chest CT Lungs/Pleura: Chronic right pleural collection with recent drainage. The cavity is of similar size with internal gas and  fluid/debris. There is a new left pleural effusion which shows signs of loculation posteriorly at the level of the fissure. Scattered irregular nodules in the right upper lobe, acute and inflammatory. Airspace disease and atelectasis now seen in the left lung. Upper Abdomen: No acute finding Musculoskeletal: Spondylosis. No acute or aggressive finding. IMPRESSION: 1. Acute airspace disease with new small to moderate left pleural effusion that is loculated appearing. 2. Stable chronic right pleural collection with gas and debris secondary to recent drainage. 3. Mild interstitial edema. Electronically Signed   By: Marnee Spring M.D.   On: 08/27/2020 04:16   DG Chest Port 1 View  Result Date: 08/26/2020 CLINICAL DATA:  COVID pneumonia. EXAM: PORTABLE CHEST 1 VIEW COMPARISON:  08/20/2020 FINDINGS: There are worsening bilateral hazy airspace opacities. There are growing bilateral pleural effusions with a persistent right-sided hydropneumothorax. No evidence for left-sided pneumothorax. The heart size remains stable. There is no acute osseous abnormality. Atherosclerotic changes are again noted. IMPRESSION: 1. Worsening multifocal airspace opacities. 2. Persistent right-sided hydropneumothorax. 3. Worsening bilateral pleural effusions. Electronically Signed   By: Katherine Mantle M.D.   On: 08/26/2020 22:51       LOS: 1 day   Jeremiah [name]  [facility] Pager on [email or web address]  08/28/2020, 10:27 AM

## 2020-08-29 DIAGNOSIS — J90 Pleural effusion, not elsewhere classified: Secondary | ICD-10-CM

## 2020-08-29 DIAGNOSIS — I10 Essential (primary) hypertension: Secondary | ICD-10-CM

## 2020-08-29 DIAGNOSIS — Z8616 Personal history of COVID-19: Secondary | ICD-10-CM

## 2020-08-29 LAB — GLUCOSE, CAPILLARY
Glucose-Capillary: 139 mg/dL — ABNORMAL HIGH (ref 70–99)
Glucose-Capillary: 210 mg/dL — ABNORMAL HIGH (ref 70–99)
Glucose-Capillary: 162 mg/dL — ABNORMAL HIGH (ref 70–99)
Glucose-Capillary: 143 mg/dL — ABNORMAL HIGH (ref 70–99)

## 2020-08-29 MED ORDER — SACCHAROMYCES BOULARDII 250 MG PO CAPS
250.0000 mg | ORAL_CAPSULE | Freq: Two times a day (BID) | ORAL | Status: DC
  Administered 2020-08-29 – 2020-08-30 (×2): 250 mg via ORAL
  Filled 2020-08-29 (×2): qty 1

## 2020-08-29 MED ORDER — AMOXICILLIN-POT CLAVULANATE 875-125 MG PO TABS
1.0000 | ORAL_TABLET | Freq: Two times a day (BID) | ORAL | Status: DC
  Administered 2020-08-29 – 2020-08-30 (×2): 1 via ORAL
  Filled 2020-08-29 (×2): qty 1

## 2020-08-29 MED ORDER — AZITHROMYCIN 500 MG PO TABS
500.0000 mg | ORAL_TABLET | Freq: Every day | ORAL | Status: DC
  Administered 2020-08-30: 500 mg via ORAL
  Filled 2020-08-29: qty 1

## 2020-08-29 NOTE — Evaluation (Signed)
Physical Therapy Evaluation Patient Details Name: Jeremiah [name] MRN: [id number] DOB: 10-Sep-1946 Today's Date: 08/29/2020   History of Present Illness  74 y.o male presenting with hypoxia and fever from [facility]. Pt with recent admission September 16 and discharged on August 21 2020 after being admitted for respiratory failure secondary to Covid infection and also aspiration pneumonia with pleural effusion requiring thoracentesis and pigtail catheter placement. PMH includes diabetes mellitus, hypertension, chronic kidney disease stage III, peripheral vascular disease, right AKA, chronic anemia  Clinical Impression   Pt presents with generalized weakness, impaired ability to perform bed mobility tasks, poor sitting balance, and decreased activity tolerance vs baseline. Pt to benefit from acute PT to address deficits. Pt requiring max +2 assist for to and from EOB, with up to max assist to maintain sitting balance during grooming tasks with OT. Pt with x5 min sitting tolerance before fatiguing, with prominent posterior and L lateral leaning requiring PT/OT assist to correct. Pt reports "mostly laying in bed" at SNF, so is significantly weaker vs baseline. PT recommending return to SNF post-acutely. PT to progress mobility as tolerated, and will continue to follow acutely.      Follow Up Recommendations SNF    Equipment Recommendations  None recommended by PT    Recommendations for Other Services       Precautions / Restrictions Precautions Precautions: Fall Precaution Comments: R AKA Restrictions Weight Bearing Restrictions: No      Mobility  Bed Mobility Overal bed mobility: Needs Assistance Bed Mobility: Rolling;Supine to Sit;Sit to Supine Rolling: Mod assist   Supine to sit: Max assist;+2 for safety/equipment Sit to supine: Max assist;+2 for safety/equipment   General bed mobility comments: mod A to roll for peri care, pt with good initiation and truncal translation  with use of UEs on bedrails; max A +2 to sit EOB for LLE translation and trunk support to attain upright sitting. Strong L lateral lean once sitting EOB  Transfers                    Ambulation/Gait                Stairs            Wheelchair Mobility    Modified Rankin (Stroke Patients Only)       Balance Overall balance assessment: Needs assistance Sitting-balance support: Bilateral upper extremity supported;Feet unsupported Sitting balance-Leahy Scale: Poor Sitting balance - Comments: intermitttent max - max A+2 to maintain sitting EOB for oral care. Strong L psoterior- lateral lean Postural control: Posterior lean;Left lateral lean                                   Pertinent Vitals/Pain Pain Assessment: Faces Faces Pain Scale: Hurts little more Pain Location: buttocks, during pericare Pain Descriptors / Indicators: Sore;Grimacing Pain Intervention(s): Limited activity within patient's tolerance;Monitored during session;Repositioned    Home Living Family/patient expects to be discharged to:: Skilled nursing facility                 Additional Comments: Pt is from [facility], recently admitted. Had started some therapy, but states "I mostly just lay in the bed"    Prior Function Level of Independence: Needs assistance   Gait / Transfers Assistance Needed: recently has not been t/fing often at facility. At baseline uses manual w/c and slide board  ADL's / Homemaking Assistance Needed: currently has  assist from facility staff, prior was able to do own toileting and dressing with as needed assist for bathing        Hand Dominance   Dominant Hand: Right    Extremity/Trunk Assessment   Upper Extremity Assessment Upper Extremity Assessment: Defer to OT evaluation    Lower Extremity Assessment Lower Extremity Assessment: Generalized weakness RLE Deficits / Details: R AKA baseline       Communication   Communication:  No difficulties  Cognition Arousal/Alertness: Awake/alert Behavior During Therapy: WFL for tasks assessed/performed Overall Cognitive Status: No family/caregiver present to determine baseline cognitive functioning Area of Impairment: Safety/judgement;Awareness;Problem solving                       Following Commands: Follows one step commands with increased time Safety/Judgement: Decreased awareness of deficits Awareness: Emergent Problem Solving: Slow processing;Difficulty sequencing;Requires verbal cues;Requires tactile cues;Decreased initiation General Comments: Overall poor awareness of deficits (leaning L when sitting EOB and unaware). Poor awareness of his incontinence, states "it just comes" and not aware he had had a BM after sitting EOB.      General Comments General comments (skin integrity, edema, etc.): L heel pressure sore, dressed with gauze and in prevalon boot    Exercises     Assessment/Plan    PT Assessment Patient needs continued PT services  PT Problem List Decreased strength;Decreased range of motion;Decreased activity tolerance;Decreased balance;Decreased mobility;Decreased coordination;Decreased knowledge of use of DME;Decreased skin integrity;Pain       PT Treatment Interventions DME instruction;Functional mobility training;Therapeutic activities;Therapeutic exercise;Balance training;Neuromuscular re-education;Patient/family education;Wheelchair mobility training    PT Goals (Current goals can be found in the Care Plan section)  Acute Rehab PT Goals Patient Stated Goal: return to independence PT Goal Formulation: With patient Time For Goal Achievement: 09/12/20 Potential to Achieve Goals: Fair    Frequency Min 2X/week   Barriers to discharge        Co-evaluation PT/OT/SLP Co-Evaluation/Treatment: Yes Reason for Co-Treatment: For patient/therapist safety;To address functional/ADL transfers PT goals addressed during session: Mobility/safety  with mobility;Balance OT goals addressed during session: ADL's and self-care;Strengthening/ROM       AM-PAC PT "6 Clicks" Mobility  Outcome Measure Help needed turning from your back to your side while in a flat bed without using bedrails?: A Lot Help needed moving from lying on your back to sitting on the side of a flat bed without using bedrails?: Total Help needed moving to and from a bed to a chair (including a wheelchair)?: Total Help needed standing up from a chair using your arms (e.g., wheelchair or bedside chair)?: Total Help needed to walk in hospital room?: Total Help needed climbing 3-5 steps with a railing? : Total 6 Click Score: 7    End of Session   Activity Tolerance: Patient limited by fatigue;Patient limited by pain Patient left: in bed;with call bell/phone within reach;with bed alarm set Nurse Communication: Mobility status PT Visit Diagnosis: Other abnormalities of gait and mobility (R26.89);Muscle weakness (generalized) (M62.81)    Time: 4098-1191 PT Time Calculation (min) (ACUTE ONLY): 37 min   Charges:   PT Evaluation $PT Eval Low Complexity: 1 Low         Avory Mimbs E, PT Acute Rehabilitation Services Pager [phone]  Office [phone]   Donyelle Enyeart D Despina Hidden 08/29/2020, 5:43 PM

## 2020-08-29 NOTE — Progress Notes (Addendum)
      [address]       [place] [address]             [phone]         Subjective: Feels okay this morning no shortness of breath or pain anywhere.   Objective: Vital signs in last 24 hours: Temp:  [97.9 F (36.6 C)-99.3 F (37.4 C)] 98.3 F (36.8 C) (10/06 0749) Pulse Rate:  [69-94] 70 (10/06 0836) Cardiac Rhythm: Normal sinus rhythm (10/06 0700) Resp:  [15-19] 18 (10/06 0500) BP: (106-142)/(54-87) 133/59 (10/06 0836) SpO2:  [90 %-99 %] 97 % (10/06 0749) Weight:  [55.8 kg] 55.8 kg (10/06 0500)     Intake/Output from previous day: 10/05 0701 - 10/06 0700 In: 100 [P.O.:100] Out: 1200 [Urine:1200] Intake/Output this shift: No intake/output data recorded.  General appearance: alert, cooperative and no distress Heart: regular rate and rhythm, S1, S2 normal, no murmur, click, rub or gallop Lungs: clear to auscultation bilaterally and diminished in the lower lobes bilaterally Abdomen: soft, non-tender; bowel sounds normal; no masses,  no organomegaly Extremities: boot in place for heel wounds Wound: n/a  Lab Results: Recent Labs    08/27/20 0307 08/28/20 0258  WBC 13.8* 9.9  HGB 6.6* 7.8*  HCT 20.2* 24.5*  PLT 220 220   BMET:  Recent Labs    08/27/20 0307 08/28/20 0258  NA 134* 136  K 4.0 4.1  CL 101 103  CO2 23 21*  GLUCOSE 135* 85  BUN 32* 29*  CREATININE 1.67* 1.46*  CALCIUM 8.1* 8.2*    PT/INR: No results for input(s): LABPROT, INR in the last 72 hours. ABG    Component Value Date/Time   HCO3 26.1 08/10/2020 1507   TCO2 27 08/10/2020 1507   O2SAT 99.0 08/10/2020 1507   CBG (last 3)  Recent Labs    08/28/20 1700 08/28/20 2052 08/29/20 0753  GLUCAP 174* 200* 139*    Assessment/Plan:  1. CV-NSR in the 70s, BP well controlled 2. Pulm-On room air with good oxygen saturation. Chest CT reviewed. Acute airspace disease with new small to moderate left pleural effusion that is loculated appearing. Continue Rocephin for  COVID related pneumonia.  3. Renal-creatinine trending down.  4. H and H 7.8/24.5, has stabilized 5. Endo-blood glucose mostly controlled 6. Chronic sacral and heel wounds-preventing him from walking. Deconditioning. Came from a SNF and plan is for discharge back to SNF.   Plan: A little confused this morning. I tried explaining robotic vs vats for recurrently pleural effusion. I believe Dr. Cliffton Asters is discussing with family today. He will be off COVID precautions tomorrow per attending.      LOS: 2 days    Sharlene Dory 08/29/2020

## 2020-08-29 NOTE — Consult Note (Signed)
[address]       [place] [address]             [phone]                    [name] [facility] Medical Record #[id number] Date of Birth: 05-29-1946  Referring: No ref. provider found Primary Care: Annita Brod, MD Primary Cardiologist: Rinaldo Cloud, MD  Chief Complaint:    Chief Complaint  Patient presents with  . Fever  . Shortness of Breath    History of Present Illness:    Jeremiah [name] 74 y.o. male transferred from [facility] secondary to fevers and hypoxia.  He was admitted in September secondary to Covid pneumonia followed requiring thoracentesis.  He subsequently was transferred to a nursing facility but he presented with fevers and hypoxia in the emergency department he was on showed worsening of his effusions.  Today he is doing well.  He denies any shortness of breath or pleuritic chest pain.  He is states that he has been off of his nasal cannula.    Past Medical History:  Diagnosis Date  . Amputee   . Aortic atherosclerosis (HCC)   . Arthritis    "joints; shoulders, knees, hands, back" (05/21/2018)  . Atherosclerosis of coronary artery   . C. difficile diarrhea 04/2018  . Diastolic CHF (HCC)   . Diverticulosis   . High cholesterol   . History of gout   . Hypertension   . IDA (iron deficiency anemia)    from referral Dr Darleene Cleaver  . Internal hemorrhoids   . Peripheral vascular disease (HCC)   . Pneumonia    "couple times" (05/21/2018)  . Sleep apnea    "has mask; won't use" (05/21/2018)  . Type II diabetes mellitus (HCC)     Past Surgical History:  Procedure Laterality Date  . ABDOMINAL AORTOGRAM W/LOWER EXTREMITY N/A 11/01/2018   Procedure: ABDOMINAL AORTOGRAM W/LOWER EXTREMITY;  Surgeon: Runell Gess, MD;  Location: [facility] INVASIVE CV LAB;  Service: Cardiovascular;  Laterality: N/A;  . ABDOMINAL AORTOGRAM W/LOWER EXTREMITY Left 06/20/2020   Procedure: ABDOMINAL AORTOGRAM W/LOWER EXTREMITY;  Surgeon:  Cephus Shelling, MD;  Location: [facility] INVASIVE CV LAB;  Service: Cardiovascular;  Laterality: Left;  . AMPUTATION Right 11/09/2018   Procedure: RIGHT - AMPUTATION ABOVE KNEE;  Surgeon: Maeola Harman, MD;  Location: [facility] OR;  Service: Vascular;  Laterality: Right;  . CATARACT EXTRACTION W/ INTRAOCULAR LENS  IMPLANT, BILATERAL Bilateral   . CHOLECYSTECTOMY  05/21/2018   ATTEMPTED LAPAROSCOPIC CHOLECYSTECTOMY, OPEN DRAINAGE OF GALLBLADDER WITH BIOPSY  . CHOLECYSTECTOMY N/A 05/21/2018   Procedure: ATTEMPTED LAPAROSCOPIC CHOLECYSTECTOMY, OPEN DRAINAGE OF GALLBLADDER WITH BIOPSY;  Surgeon: Griselda Miner, MD;  Location: [facility] OR;  Service: General;  Laterality: N/A;  . COLONOSCOPY W/ POLYPECTOMY    . COLONOSCOPY WITH PROPOFOL N/A 09/16/2018   Procedure: COLONOSCOPY WITH PROPOFOL;  Surgeon: Charlott Rakes, MD;  Location: [facility] ENDOSCOPY;  Service: Endoscopy;  Laterality: N/A;  . 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 FLEXIBLE SIGMOIDOSCOPY N/A 04/20/2020   Procedure: FLEXIBLE SIGMOIDOSCOPY;  Surgeon: Meridee Score Netty Starring., MD;  Location: [facility];  Service: Gastroenterology;  Laterality: N/A;  Fecal Disimpaction  . IR CATHETER TUBE CHANGE  04/14/2018  . IR CHOLANGIOGRAM EXISTING TUBE  03/17/2018  . IR PERC CHOLECYSTOSTOMY  01/31/2018  .  IR RADIOLOGIST EVAL & MGMT  03/02/2018  . PERIPHERAL VASCULAR INTERVENTION Left 06/20/2020   Procedure: PERIPHERAL VASCULAR INTERVENTION;  Surgeon: Cephus Shelling, MD;  Location: [facility] INVASIVE CV LAB;  Service: Cardiovascular;  Laterality: Left;  4 SFA STENTS    Family History  Problem Relation Age of Onset  . Diabetes Mother   . Hypertension Mother   . Heart disease Father   . Heart attack Father   . Diabetes Sister   . Hypertension Sister   . Diabetes Brother   . Heart disease Brother   . Hypertension Brother   . Heart  attack Brother      Social History   Tobacco Use  Smoking Status Former Smoker  . Years: 24.00  . Types: Cigarettes  . Quit date: 11/25/1983  . Years since quitting: 36.7  Smokeless Tobacco Never Used    Social History   Substance and Sexual Activity  Alcohol Use Not Currently     No Known Allergies  Current Facility-Administered Medications  Medication Dose Route Frequency Provider Last Rate Last Admin  . 0.9 %  sodium chloride infusion (Manually program via Guardrails IV Fluids)   Intravenous Once Eduard Clos, MD   Held at 08/27/20 [phone]  . acetaminophen (TYLENOL) tablet 650 mg  650 mg Oral Q6H PRN Eduard Clos, MD       Or  . acetaminophen (TYLENOL) suppository 650 mg  650 mg Rectal Q6H PRN Eduard Clos, MD      . amLODipine (NORVASC) tablet 10 mg  10 mg Oral Daily Eduard Clos, MD   10 mg at 08/29/20 0836  . ascorbic acid (VITAMIN C) tablet 500 mg  500 mg Oral Daily Eduard Clos, MD   500 mg at 08/29/20 0836  . aspirin chewable tablet 81 mg  81 mg Oral Daily Eduard Clos, MD   81 mg at 08/29/20 0836  . atorvastatin (LIPITOR) tablet 20 mg  20 mg Oral Daily Eduard Clos, MD   20 mg at 08/29/20 0836  . azithromycin (ZITHROMAX) 500 mg in sodium chloride 0.9 % 250 mL IVPB  500 mg Intravenous Q24H Eduard Clos, MD 250 mL/hr at 08/29/20 0214 500 mg at 08/29/20 0214  . brimonidine (ALPHAGAN) 0.2 % ophthalmic solution 1 drop  1 drop Both Eyes BID Eduard Clos, MD   1 drop at 08/29/20 [phone]  . cefTRIAXone (ROCEPHIN) 2 g in sodium chloride 0.9 % 100 mL IVPB  2 g Intravenous Q24H Eduard Clos, MD 200 mL/hr at 08/29/20 0140 2 g at 08/29/20 0140  . clopidogrel (PLAVIX) tablet 75 mg  75 mg Oral Q breakfast Eduard Clos, MD   75 mg at 08/29/20 0836  . feeding supplement (ENSURE ENLIVE) (ENSURE ENLIVE) liquid 237 mL  237 mL Oral TID BM Osvaldo Shipper, MD   237 mL at 08/29/20 1308  . ferrous sulfate tablet  325 mg  325 mg Oral BID WC Eduard Clos, MD   325 mg at 08/29/20 0836  . heparin injection 5,000 Units  5,000 Units Subcutaneous Q8H Eduard Clos, MD   5,000 Units at 08/29/20 1310  . hydrALAZINE (APRESOLINE) tablet 50 mg  50 mg Oral BID Eduard Clos, MD   50 mg at 08/29/20 0836  . insulin aspart (novoLOG) injection 0-9 Units  0-9 Units Subcutaneous TID WC Eduard Clos, MD   3 Units at 08/29/20 1231  . metoprolol succinate (TOPROL-XL) 24 hr tablet 25 mg  25 mg Oral Daily Eduard Clos, MD   25 mg at 08/29/20 1638  . nutrition supplement (JUVEN) (JUVEN) powder packet 1 packet  1 packet Oral BID BM Osvaldo Shipper, MD   1 packet at 08/29/20 1309  . ondansetron (ZOFRAN) tablet 4 mg  4 mg Oral Q6H PRN Eduard Clos, MD       Or  . ondansetron [facility]) injection 4 mg  4 mg Intravenous Q6H PRN Eduard Clos, MD      . pantoprazole (PROTONIX) EC tablet 40 mg  40 mg Oral Daily Eduard Clos, MD   40 mg at 08/29/20 0836  . timolol (TIMOPTIC) 0.5 % ophthalmic solution 1 drop  1 drop Both Eyes BID Eduard Clos, MD   1 drop at 08/29/20 0834  . vitamin B-12 (CYANOCOBALAMIN) tablet 1,000 mcg  1,000 mcg Oral Daily Eduard Clos, MD   1,000 mcg at 08/29/20 0836  . zinc sulfate capsule 220 mg  220 mg Oral Daily Eduard Clos, MD   220 mg at 08/29/20 4665    Review of Systems  Constitutional: Positive for malaise/fatigue. Negative for chills and fever.  Respiratory: Positive for cough and shortness of breath.     PHYSICAL EXAMINATION: BP 111/67 (BP Location: Right Arm)   Pulse 78   Temp 98.3 F (36.8 C) (Oral)   Resp 16   Wt 55.8 kg   SpO2 92%   BMI 18.17 kg/m   Physical Exam Constitutional:      General: He is not in acute distress.    Appearance: He is ill-appearing. He is not toxic-appearing or diaphoretic.  HENT:     Head: Normocephalic and atraumatic.  Cardiovascular:     Rate and Rhythm: Normal rate.    Pulmonary:     Effort: Pulmonary effort is normal. No respiratory distress.  Neurological:     General: No focal deficit present.     Mental Status: He is alert and oriented to person, place, and time.      Diagnostic Studies & Laboratory data:     Recent Radiology Findings:   DG Chest 1 View  Result Date: 08/17/2020 CLINICAL DATA:  Chest tube present.  Pleural effusion. EXAM: CHEST  1 VIEW COMPARISON:  August 16, 2020 FINDINGS: Pigtail catheter again noted on the right, unchanged in position. No evident pneumothorax. Loculated pleural effusion again noted with areas of atelectasis and patchy airspace opacity in the lateral right mid and lower lung regions. Left lung is clear. Heart is mildly prominent, stable, with pulmonary vascularity normal. No adenopathy. There is aortic atherosclerosis. A focus of sclerosis is noted in the proximal right humerus, incompletely visualized. IMPRESSION: Stable pigtail catheter position on the right. Loculated pleural effusion with patchy atelectasis/infiltrate on the right. Left lung clear. Stable cardiac prominence. No pneumothorax. Small chondroid matrix lesion in the proximal right humerus consistent with either small enchondroma or infarct. Aortic Atherosclerosis (ICD10-I70.0). Electronically Signed   By: Bretta Bang III M.D.   On: 08/17/2020 08:02   DG Chest 1 View  Result Date: 08/16/2020 CLINICAL DATA:  Chest tube in place. EXAM: CHEST  1 VIEW COMPARISON:  Radiograph yesterday.  CT 08/10/2020 FINDINGS: Placement of right pigtail catheter with catheter tip in the mid lower lung zone. Slight decreased right pleural effusion with partially loculated fluid persisting inferior laterally as well as right basilar atelectasis. No visualized pneumothorax. Heart is upper normal size. Unchanged mediastinal contours. Aortic atherosclerosis. Left lung essentially clear. IMPRESSION: Placement of  right pigtail catheter with slight decreased right pleural  effusion. Residual partially loculated fluid in the inferior lateral right hemithorax. No visualized pneumothorax. Electronically Signed   By: Narda Rutherford M.D.   On: 08/16/2020 15:07   CT HEAD WO CONTRAST  Result Date: 08/09/2020 CLINICAL DATA:  Delirium EXAM: CT HEAD WITHOUT CONTRAST TECHNIQUE: Contiguous axial images were obtained from the base of the skull through the vertex without intravenous contrast. COMPARISON:  06/25/2020 FINDINGS: Brain: Normal anatomic configuration. Parenchymal volume loss is commensurate with the patient's age. Mild-to-moderate periventricular white matter changes are present likely reflecting the sequela of small vessel ischemia. No abnormal intra or extra-axial mass lesion or fluid collection. No abnormal mass effect or midline shift. No evidence of acute intracranial hemorrhage or infarct. Ventricular size is normal. Cerebellum unremarkable. Vascular: Extensive calcifications are seen within the carotid siphons and distal vertebral arteries bilaterally. No asymmetric hyperdense vasculature at the skull base. Skull: Intact Sinuses/Orbits: The paranasal sinuses are clear. Intraorbital surgical implant is seen along the lateral aspect of the right ocular globe. The orbits are otherwise unremarkable. Other: Mastoid air cells and middle ear cavities are clear. IMPRESSION: 1. No acute intracranial abnormality. 2. Mild-to-moderate periventricular white matter changes likely reflecting the sequela of small vessel ischemia. Electronically Signed   By: Helyn Numbers MD   On: 08/09/2020 23:46   CT CHEST WO CONTRAST  Result Date: 08/27/2020 CLINICAL DATA:  Persistent cough EXAM: CT CHEST WITHOUT CONTRAST TECHNIQUE: Multidetector CT imaging of the chest was performed following the standard protocol without IV contrast. COMPARISON:  08/19/2020 FINDINGS: Cardiovascular: Mildly enlarged heart. Aortic and coronary atherosclerosis. Mediastinum/Nodes: Large mediastinal lymph nodes,  especially subcarinal, considered reactive given the extent of pulmonary findings. Prominent thickness of the mid to lower esophagus, not well assessed on this noncontrast chest CT Lungs/Pleura: Chronic right pleural collection with recent drainage. The cavity is of similar size with internal gas and fluid/debris. There is a new left pleural effusion which shows signs of loculation posteriorly at the level of the fissure. Scattered irregular nodules in the right upper lobe, acute and inflammatory. Airspace disease and atelectasis now seen in the left lung. Upper Abdomen: No acute finding Musculoskeletal: Spondylosis. No acute or aggressive finding. IMPRESSION: 1. Acute airspace disease with new small to moderate left pleural effusion that is loculated appearing. 2. Stable chronic right pleural collection with gas and debris secondary to recent drainage. 3. Mild interstitial edema. Electronically Signed   By: Marnee Spring M.D.   On: 08/27/2020 04:16   CT CHEST WO CONTRAST  Result Date: 08/19/2020 CLINICAL DATA:  Reduction in pleural fluid following drainage placement with loculated pneumothorax. EXAM: CT CHEST WITHOUT CONTRAST TECHNIQUE: Multidetector CT imaging of the chest was performed following the standard protocol without IV contrast. COMPARISON:  CT from 08/10/2020, chest x-ray from 08/19/2020 FINDINGS: Cardiovascular: Somewhat limited due to lack of IV contrast. Aortic calcifications and coronary calcifications are noted. No cardiac enlargement is seen. No enlargement of the pulmonary artery is seen. No aneurysmal dilatation of the aorta is noted. Mediastinum/Nodes: Thoracic inlet is within normal limits. No sizable hilar or mediastinal adenopathy is noted. Mild circumferential thickening of the esophagus is noted possibly related to reflux disease. No mass lesion is noted. Lungs/Pleura: Left lung is well aerated without focal infiltrate or sizable effusion. Right lung shows lower lobe consolidation  which has improved in the interval from the prior exam. Right-sided drainage catheter has been removed in the interval from the prior chest x-ray. The previously seen  pleural fluid has reduced significantly now with considerable air within. This is likely related to incomplete re-expansion of the right lung. Upper Abdomen: Visualized upper abdomen is within normal limits. Musculoskeletal: Degenerative changes of the thoracic spine are noted. No acute rib abnormality is seen. IMPRESSION: Interval removal of right-sided drainage catheter. Significant reduction in pleural fluid when compared with previous CT examination. The space is now occupied predominately by air with a small amount of complex fluid identified. Persistent but improved consolidation in the right lower lobe is noted. Aortic Atherosclerosis (ICD10-I70.0). Electronically Signed   By: Alcide CleverMark  Lukens M.D.   On: 08/19/2020 15:01   CT ANGIO CHEST PE W OR WO CONTRAST  Result Date: 08/10/2020 CLINICAL DATA:  Altered mental status with cough and diarrhea for 1 week. Pulmonary embolism suspected. EXAM: CT ANGIOGRAPHY CHEST WITH CONTRAST TECHNIQUE: Multidetector CT imaging of the chest was performed using the standard protocol during bolus administration of intravenous contrast. Multiplanar CT image reconstructions and MIPs were obtained to evaluate the vascular anatomy. CONTRAST:  55mL OMNIPAQUE IOHEXOL 350 MG/ML SOLN COMPARISON:  Radiographs 08/09/2020. Noncontrast chest CT 11/05/2018. FINDINGS: Cardiovascular: The pulmonary arteries are well opacified with contrast to the level of the subsegmental branches. There is no evidence of acute pulmonary embolism. There is a possible small linear filling defect within the right lower lobe segmental pulmonary artery (image 51/5) which could relate to previous pulmonary embolism. There is limited luminal opacification of the systemic vessels without evidence of acute findings. There is diffuse atherosclerosis of the  aorta, great vessels and coronary arteries. Aortic valvular calcifications are present. The heart size is normal. There is no pericardial effusion. Mediastinum/Nodes: There are no enlarged mediastinal, hilar or axillary lymph nodes. The thyroid gland, trachea and esophagus demonstrate no significant findings. Lungs/Pleura: Chronic dependent right pleural effusion is associated with pleural thickening and has mildly progressed compared with the prior CT. There is resulting increased compressive atelectasis of the right middle and lower lobes. The lower lobe is completely collapsed, and there is abrupt cut off of the right lower and middle lobe bronchi proximally. Right middle lobe atelectasis may obscure the previously demonstrated right middle lobe nodular density. There is new patchy right upper lobe airspace disease, suspicious for pneumonia. The left lung is clear. There are small calcified pleural plaques bilaterally. Upper abdomen: The visualized upper abdomen appears stable without acute findings. Musculoskeletal/Chest wall: There is no chest wall mass or suspicious osseous finding. Review of the MIP images confirms the above findings. IMPRESSION: 1. No evidence of acute pulmonary embolism. Possible small linear filling defect within the right lower lobe segmental pulmonary artery could relate to previous pulmonary embolism. 2. Enlarging chronic right pleural effusion with increased compressive atelectasis of the right middle and lower lobes and associated central bronchial occlusion. Underlying neoplasm cannot be excluded. Further evaluation recommended with thoracentesis and possible bronchoscopy. 3. New patchy right upper lobe airspace disease, suspicious for pneumonia. Radiographic follow up recommended. 4. 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 5. Aortic Atherosclerosis (ICD10-I70.0). Electronically Signed   By: Carey BullocksWilliam  Veazey M.D.   On: 08/10/2020 11:50   DG  Chest Port 1 View  Result Date: 08/26/2020 CLINICAL DATA:  COVID pneumonia. EXAM: PORTABLE CHEST 1 VIEW COMPARISON:  08/20/2020 FINDINGS: There are worsening bilateral hazy airspace opacities. There are growing bilateral pleural effusions with a persistent right-sided hydropneumothorax. No evidence for left-sided pneumothorax. The heart size remains stable. There is no acute osseous abnormality. Atherosclerotic changes are again noted. IMPRESSION: 1.  Worsening multifocal airspace opacities. 2. Persistent right-sided hydropneumothorax. 3. Worsening bilateral pleural effusions. Electronically Signed   By: Katherine Mantle M.D.   On: 08/26/2020 22:51   DG Chest Port 1 View  Result Date: 08/20/2020 CLINICAL DATA:  Complication of chest tube.  COVID. EXAM: PORTABLE CHEST 1 VIEW COMPARISON:  CT 08/19/2020.  Chest x-ray 08/19/2020. FINDINGS: Interim removal of chest tube. Right base pleural air collection again noted without interim change. Progressive right base atelectasis/consolidation. Tiny right pleural effusion. Heart size stable. Degenerative change thoracic spine. IMPRESSION: Interim removal of chest tube. Right base pleural air collection again noted without interim change. Progressive right base atelectasis/consolidation. Tiny right pleural effusion. Electronically Signed   By: Maisie Fus  Register   On: 08/20/2020 06:00   DG Chest Port 1 View  Result Date: 08/19/2020 CLINICAL DATA:  Follow-up chest tube for loculated RIGHT pleural effusion. EXAM: PORTABLE CHEST 1 VIEW COMPARISON:  08/17/2020 FINDINGS: A RIGHT pigtail thoracostomy tube is noted. Visualized portion of the loculated RIGHT pleural effusion now mainly contains gas. Bibasilar atelectasis is present. No other significant change identified. IMPRESSION: Visualized portion of the loculated RIGHT pleural effusion now mainly contains gas. RIGHT pigtail thoracostomy tube again noted. Bibasilar atelectasis. Electronically Signed   By: Harmon Pier M.D.    On: 08/19/2020 08:06   DG CHEST PORT 1 VIEW  Result Date: 08/15/2020 CLINICAL DATA:  Pleural effusion.  COVID positive. EXAM: PORTABLE CHEST 1 VIEW COMPARISON:  CTA chest dated August 10, 2020. Chest x-ray dated August 09, 2020. FINDINGS: The heart size and mediastinal contours are within normal limits. Normal pulmonary vascularity. Unchanged partially loculated right pleural effusion with slightly improved aeration of the right middle and lower lobes. Left lung is clear. No pneumothorax. No acute osseous abnormality. Partially visualized small chondroid lesion in the right proximal humerus, likely an enchondroma. IMPRESSION: 1. Unchanged loculated right pleural effusion with slightly improved aeration of the right middle and lower lobes. Electronically Signed   By: Obie Dredge M.D.   On: 08/15/2020 16:30   DG Chest Port 1 View  Result Date: 08/09/2020 CLINICAL DATA:  Questionable sepsis EXAM: PORTABLE CHEST 1 VIEW COMPARISON:  Chest x-ray 06/25/2020 FINDINGS: The heart size and mediastinal contours are unchanged. Aortic arch calcifications. Interval development of hazy airspace opacity within the right lower lobe. No pulmonary edema. Similar-appearing at least small to moderate volume right pleural effusion. No left pleural effusion. No pneumothorax. No acute osseous abnormality. IMPRESSION: Stable small to moderate right pleural effusion with interval increase in right lower lobe opacity that may represent inflammation/infection versus passive atelectasis. Electronically Signed   By: Tish Frederickson M.D.   On: 08/09/2020 20:14   DG Foot 2 Views Left  Result Date: 08/09/2020 CLINICAL DATA:  Diabetic left ankle ulceration, EXAM: LEFT FOOT - 2 VIEW COMPARISON:  06/25/2020 FINDINGS: Frontal and lateral views of the left foot are obtained. Stable osteopenia. No acute fracture, subluxation, or dislocation. Marked joint space narrowing and osteophyte formation within the midfoot, consistent with  osteoarthritis or developing Charcot arthropathy. Large inferior calcaneal spur unchanged. No radiographic evidence of osteomyelitis. There is progressive dorsal soft tissue swelling of the forefoot. IMPRESSION: 1. Progressive soft tissue swelling of the forefoot. 2. Stable degenerative changes.  No acute or destructive process. Electronically Signed   By: Sharlet Salina M.D.   On: 08/09/2020 23:16   VAS [place] LOWER EXTREMITY VENOUS (DVT)  Result Date: 08/12/2020  Lower Venous DVTStudy Indications: Covid-19, Elevated D-Dimer.  Risk Factors: Right AKA, left foot  ulcer. Comparison Study: No prior study on file Performing Technologist: Sherren Kerns RVS  Examination Guidelines: A complete evaluation includes B-mode imaging, spectral Doppler, color Doppler, and power Doppler as needed of all accessible portions of each vessel. Bilateral testing is considered an integral part of a complete examination. Limited examinations for reoccurring indications may be performed as noted. The reflux portion of the exam is performed with the patient in reverse Trendelenburg.  +---------+---------------+---------+-----------+----------+--------------+ RIGHT    CompressibilityPhasicitySpontaneityPropertiesThrombus Aging +---------+---------------+---------+-----------+----------+--------------+ CFV      Full           Yes      Yes                                 +---------+---------------+---------+-----------+----------+--------------+ SFJ      Full                                                        +---------+---------------+---------+-----------+----------+--------------+ FV Prox  Full                                                        +---------+---------------+---------+-----------+----------+--------------+ FV Mid   Full                                                        +---------+---------------+---------+-----------+----------+--------------+ FV DistalFull                                                         +---------+---------------+---------+-----------+----------+--------------+ PFV      Full                                                        +---------+---------------+---------+-----------+----------+--------------+ POP                                                   AKA            +---------+---------------+---------+-----------+----------+--------------+ PTV                                                   AKA            +---------+---------------+---------+-----------+----------+--------------+ PERO  AKA            +---------+---------------+---------+-----------+----------+--------------+   +---------+---------------+---------+-----------+----------+-------------------+ LEFT     CompressibilityPhasicitySpontaneityPropertiesThrombus Aging      +---------+---------------+---------+-----------+----------+-------------------+ CFV      Full           Yes      Yes                                      +---------+---------------+---------+-----------+----------+-------------------+ SFJ      Full                                                             +---------+---------------+---------+-----------+----------+-------------------+ FV Prox  Full                                                             +---------+---------------+---------+-----------+----------+-------------------+ FV Mid   Full                                                             +---------+---------------+---------+-----------+----------+-------------------+ FV Distal               Yes      Yes                  patent by color and                                                       Doppler             +---------+---------------+---------+-----------+----------+-------------------+ POP      Full           Yes      Yes                                       +---------+---------------+---------+-----------+----------+-------------------+ PTV      Full                                                             +---------+---------------+---------+-----------+----------+-------------------+ PERO     Full                                                             +---------+---------------+---------+-----------+----------+-------------------+  Summary: RIGHT: - There is no evidence of deep vein thrombosis in the lower extremity.  LEFT: - There is no evidence of deep vein thrombosis in the lower extremity.  *See table(s) above for measurements and observations. Electronically signed by Lemar Livings MD on 08/12/2020 at 11:03:46 AM.    Final        I have independently reviewed the above radiology studies  and reviewed the findings with the patient.   Recent Lab Findings: Lab Results  Component Value Date   WBC 9.9 08/28/2020   HGB 7.8 (L) 08/28/2020   HCT 24.5 (L) 08/28/2020   PLT 220 08/28/2020   GLUCOSE 85 08/28/2020   CHOL 100 11/04/2018   TRIG 74 08/26/2020   HDL 27 (L) 11/04/2018   LDLCALC 55 11/04/2018   ALT 14 08/26/2020   AST 13 (L) 08/26/2020   NA 136 08/28/2020   K 4.1 08/28/2020   CL 103 08/28/2020   CREATININE 1.46 (H) 08/28/2020   BUN 29 (H) 08/28/2020   CO2 21 (L) 08/28/2020   TSH 0.264 (L) 07/08/2018   INR 1.2 08/09/2020   HGBA1C 6.0 (H) 08/10/2020       Assessment / Plan:   74 year old male with a history of Covid pneumonia, complex bilateral pleural effusions that have been previously managed with tube thoracostomy lytic therapy represents with chronic pleural effusions.  On physical exam he is not on any supplemental oxygen, and denies any pleuritic pain or dyspnea.  He states that for the most part he does not have any issues breathing and gets around in his wheelchair without much difficulty or shortness of breath.  I agree that he is a very poor surgical candidate and his ability to rehab after  surgery which would require extensive pulmonary toilet.  Also given that he has bilateral effusions it would likely require treatment on both sides the risk of prolonged ventilation, and recurrent pneumonia is very high in this debilitated patient.  Additionally given that he is not very symptomatic from a pulmonary standpoint and not requiring any supplemental oxygen, I am not sure that surgery will improve his clinical status.  He has been afebrile and his leukocytosis is improving.  Given the circumstances I would recommend against surgical drainage since it would be very high risk in this patient.  And also he is progressing on medical therapy.  If his symptoms worsen, image guided drainage with a repeat lytic therapy should be considered prior to surgery.  Please call with questions.      Corliss Skains 08/29/2020 4:35 PM

## 2020-08-29 NOTE — Progress Notes (Addendum)
[facility] PROGRESS NOTE   MORY HERRMAN ZOX:[id number] DOB: 04/30/1946 DOA: 08/26/2020  PCP: Annita Brod, MD  Brief History/Interval Summary: 74 y.o. male with history of diabetes mellitus, hypertension, chronic kidney disease stage III, peripheral vascular disease, right AKA, chronic anemia who was recently admitted from September 16 and discharged on August 21 2020 after being admitted for respiratory failure secondary to Covid infection and also aspiration pneumonia with pleural effusion requiring thoracentesis and pigtail catheter placement which was subsequently removed and patient was discharged to skilled nursing facility.  Per the report patient became hypoxic febrile and was saturating around 70% requiring 4 L oxygen to maintain sats. In the ER patient was hypoxic requiring 4 L oxygen chest x-ray showing worsening infiltrates bilaterally with pleural effusion and persistent right-sided hydropneumothorax.  Patient was febrile with temperature 101 F labs were significant for WBC of 17.3 hemoglobin 7 creatinine 1.7 D-dimer 2.5 CRP 18.8.  Patient had blood cultures drawn and started on empiric antibiotics for pneumonia. Patient was hospitalized for further management.  Reason for Visit: Pneumonia, concern for healthcare associated. Pleural effusion  Consultants: Cardiothoracic surgery  Procedures: None yet  Antibiotics: Anti-infectives (From admission, onward)   Start     Dose/Rate Route Frequency Ordered Stop   08/28/20 0400  vancomycin (VANCOREADY) IVPB 750 mg/150 mL        750 mg 150 mL/hr over 60 Minutes Intravenous Every 24 hours 08/27/20 0341     08/28/20 0200  cefTRIAXone (ROCEPHIN) 2 g in sodium chloride 0.9 % 100 mL IVPB        2 g 200 mL/hr over 30 Minutes Intravenous Every 24 hours 08/27/20 0310 09/02/20 0159   08/28/20 0200  azithromycin (ZITHROMAX) 500 mg in sodium chloride 0.9 % 250 mL IVPB        500 mg 250 mL/hr over 60 Minutes Intravenous Every 24  hours 08/27/20 0310 09/02/20 0159   08/27/20 0100  vancomycin (VANCOCIN) IVPB 1000 mg/200 mL premix        1,000 mg 200 mL/hr over 60 Minutes Intravenous  Once 08/27/20 0048 08/27/20 0519   08/27/20 0045  cefTRIAXone (ROCEPHIN) 1 g in sodium chloride 0.9 % 100 mL IVPB        1 g 200 mL/hr over 30 Minutes Intravenous  Once 08/27/20 0041 08/27/20 0215   08/27/20 0045  azithromycin (ZITHROMAX) 500 mg in sodium chloride 0.9 % 250 mL IVPB        500 mg 250 mL/hr over 60 Minutes Intravenous  Once 08/27/20 0041 08/27/20 0320   08/27/20 0045  vancomycin (VANCOREADY) IVPB 500 mg/100 mL  Status:  Discontinued        500 mg 100 mL/hr over 60 Minutes Intravenous  Once 08/27/20 0041 08/27/20 0048      Subjective/Interval History: Patient noted to be distracted but denies any chest pain or shortness of breath.  States that he is feeling well.    Assessment/Plan:  Acute respiratory failure with hypoxia/possible healthcare associated pneumonia Recently hospitalized for COVID-19 and was also found to have right-sided pleural effusion for which patient underwent thoracentesis as well as a pigtail catheter placement which was subsequently removed prior to discharge.  Patient was noted to be febrile at the time of admission. Hospitalized and placed on ceftriaxone azithromycin and vancomycin.  CT chest showed acute airspace disease with numerous small to moderate left-sided pleural effusion that appears to be loculated. Has stable chronic right pleural collection with gas and debris secondary to recent drainage. Mild  interstitial edema was noted. Cardiothoracic surgery was consulted who recommended transfer to [facility].  Await their input. Seems to be stable from a respiratory standpoint.  Noted to be on room air this morning. MRSA PCR is negative.  Blood cultures have been negative so far.  Okay to discontinue vancomycin.  Recent COVID-19 pneumonia Patient has completed treatment for same. His current  symptoms are not due to COVID-19. Tested positive on September 16. His 3 weeks will complete on 10/6. On 10/7 he can come off of isolation.  Acute on chronic kidney disease stage IIIb Looks like his baseline creatinine is around 1.2. Presented with a creatinine of 1.7. Improved to 1.46.  Labs are pending from today.  Continue to monitor urine output.   History of peripheral vascular disease Statin and antiplatelet agent to be continued. Underwent left lower extremity angiogram for critical limb ischemia in July. Stent was placed into the SFA. Continue Plavix.  Anemia of Chronic disease Hemoglobin was 7.7 in September. Came in with a hemoglobin of 7.0 dropped to 6.6. Was transfused. Improved to 7.8.  No overt bleeding noted.  Labs are pending from this morning.  Essential hypertension On hydralazine beta-blockers and amlodipine.  Blood pressure is reasonably well controlled.  Acute metabolic encephalopathy Unclear if the patient has a history of cognitive impairment. He is not delirious currently. Slow to respond at times. Seems to be mildly confused. No focal deficits noted.  Mentation is stable.  Diabetes mellitus type 2 Noted to be on Amaryl prior to admission.  CBGs are reasonably well controlled. A1c was 6.0 in September.  Chronic sacral and heel wounds Wound care. Pressure Injury 08/27/20 Heel Left Deep Tissue Pressure Injury - Purple or maroon localized area of discolored intact skin or blood-filled blister due to damage of underlying soft tissue from pressure and/or shear. (Active)  08/27/20 1033  Location: Heel  Location Orientation: Left  Staging: Deep Tissue Pressure Injury - Purple or maroon localized area of discolored intact skin or blood-filled blister due to damage of underlying soft tissue from pressure and/or shear.  Wound Description (Comments):   Present on Admission: Yes     Pressure Injury 08/27/20 Sacrum Mid Stage 2 -  Partial thickness loss of dermis presenting  as a shallow open injury with a red, pink wound bed without slough. (Active)  08/27/20 1033  Location: Sacrum  Location Orientation: Mid  Staging: Stage 2 -  Partial thickness loss of dermis presenting as a shallow open injury with a red, pink wound bed without slough.  Wound Description (Comments):   Present on Admission: Yes    DVT Prophylaxis: Subcutaneous heparin Code Status: Full code Family Communication: Cardiothoracic surgery to speak with family later today. Disposition Plan: Return to SNF when improved. PT and OT evaluation.  Status is: Inpatient  Remains inpatient appropriate because:IV treatments appropriate due to intensity of illness or inability to take PO and Inpatient level of care appropriate due to severity of illness   Dispo: The patient is from: SNF              Anticipated d/c is to: SNF              Anticipated d/c date is: 2 days              Patient currently is not medically stable to d/c.     Medications:  Scheduled: . sodium chloride   Intravenous Once  . amLODipine  10 mg Oral Daily  . vitamin  C  500 mg Oral Daily  . aspirin  81 mg Oral Daily  . atorvastatin  20 mg Oral Daily  . brimonidine  1 drop Both Eyes BID  . clopidogrel  75 mg Oral Q breakfast  . feeding supplement (ENSURE ENLIVE)  237 mL Oral TID BM  . ferrous sulfate  325 mg Oral BID WC  . heparin  5,000 Units Subcutaneous Q8H  . hydrALAZINE  50 mg Oral BID  . insulin aspart  0-9 Units Subcutaneous TID WC  . metoprolol succinate  25 mg Oral Daily  . nutrition supplement (JUVEN)  1 packet Oral BID BM  . pantoprazole  40 mg Oral Daily  . timolol  1 drop Both Eyes BID  . cyanocobalamin  1,000 mcg Oral Daily  . zinc sulfate  220 mg Oral Daily   Continuous: . azithromycin 500 mg (08/29/20 0214)  . cefTRIAXone (ROCEPHIN)  IV 2 g (08/29/20 0140)  . vancomycin 750 mg (08/29/20 0408)   [id number] **OR** acetaminophen, ondansetron **OR** ondansetron (ZOFRAN)  IV   Objective:  Vital Signs  Vitals:   08/29/20 0500 08/29/20 0739 08/29/20 0749 08/29/20 0836  BP: 112/87 106/60  (!) 133/59  Pulse: 71 94 69 70  Resp: 18     Temp: 98.6 F (37 C)  98.3 F (36.8 C)   TempSrc: Oral     SpO2: 91% 90% 97%   Weight: 55.8 kg       Intake/Output Summary (Last 24 hours) at 08/29/2020 1036 Last data filed at 08/29/2020 0459 Gross per 24 hour  Intake 100 ml  Output 1200 ml  Net -1100 ml   Filed Weights   08/29/20 0500  Weight: 55.8 kg    General appearance: Awake alert.  In no distress.  Pleasantly confused Resp: Normal effort.  few crackles bilateral bases.  Some dullness to percussion appreciated at the bases.  No wheezing or rhonchi.   Cardio: S1-S2 is normal regular.  No S3-S4.  No rubs murmurs or bruit GI: Abdomen is soft.  Nontender nondistended.  Bowel sounds are present normal.  No masses organomegaly Extremities: No edema.  Moving his lower extremities. Neurologic: No focal neurological deficits.    Lab Results:  Data Reviewed: I have personally reviewed following labs and imaging studies  CBC: Recent Labs  Lab 08/26/20 2233 08/27/20 0307 08/28/20 0258  WBC 17.3* 13.8* 9.9  NEUTROABS 13.7*  --  7.0  HGB 7.0* 6.6* 7.8*  HCT 21.2* 20.2* 24.5*  MCV 86.2 87.1 85.1  PLT 237 220 220    Basic Metabolic Panel: Recent Labs  Lab 08/26/20 2233 08/27/20 0307 08/28/20 0258  NA 137 134* 136  K 4.3 4.0 4.1  CL 101 101 103  CO2 23 23 21*  GLUCOSE 131* 135* 85  BUN 30* 32* 29*  CREATININE 1.70* 1.67* 1.46*  CALCIUM 8.8* 8.1* 8.2*  MG  --   --  2.0  PHOS  --   --  2.9    GFR: Estimated Creatinine Clearance: 34 mL/min (A) (by C-G formula based on SCr of 1.46 mg/dL (H)).  Liver Function Tests: Recent Labs  Lab 08/26/20 2233  AST 13*  ALT 14  ALKPHOS 54  BILITOT 0.9  PROT 6.5  ALBUMIN 2.3*     CBG: Recent Labs  Lab 08/28/20 0750 08/28/20 1200 08/28/20 1700 08/28/20 2052 08/29/20 0753  GLUCAP 80 151* 174*  200* 139*    Lipid Profile: Recent Labs    08/26/20 2233  TRIG 74  Anemia Panel: Recent Labs    08/26/20 2233  FERRITIN 595*    Recent Results (from the past 240 hour(s))  Blood Culture (routine x 2)     Status: None (Preliminary result)   Collection Time: 08/26/20 10:33 PM   Specimen: BLOOD  Result Value Ref Range Status   Specimen Description   Final    BLOOD LEFT WRIST Performed at [facility], [address] [place], [place] [address]    Special Requests   Final    BOTTLES DRAWN AEROBIC AND ANAEROBIC Blood Culture results may not be optimal due to an inadequate volume of blood received in culture bottles Performed at [facility], [address], [place], [place] [address]    Culture   Final    NO GROWTH 2 DAYS Performed at [facility], [address], [place], [place] [address]    Report Status PENDING  Incomplete  Respiratory Panel by PCR     Status: None   Collection Time: 08/26/20 10:34 PM   Specimen: Nasopharyngeal Swab; Respiratory  Result Value Ref Range Status   Adenovirus NOT DETECTED NOT DETECTED Final   Coronavirus 229E NOT DETECTED NOT DETECTED Final    Comment: (NOTE) The Coronavirus on the Respiratory Panel, DOES NOT test for the novel  Coronavirus (2019 nCoV)    Coronavirus HKU1 NOT DETECTED NOT DETECTED Final   Coronavirus NL63 NOT DETECTED NOT DETECTED Final   Coronavirus OC43 NOT DETECTED NOT DETECTED Final   Metapneumovirus NOT DETECTED NOT DETECTED Final   Rhinovirus / Enterovirus NOT DETECTED NOT DETECTED Final   Influenza A NOT DETECTED NOT DETECTED Final   Influenza B NOT DETECTED NOT DETECTED Final   Parainfluenza Virus 1 NOT DETECTED NOT DETECTED Final   Parainfluenza Virus 2 NOT DETECTED NOT DETECTED Final   Parainfluenza Virus 3 NOT DETECTED NOT DETECTED Final   Parainfluenza Virus 4 NOT DETECTED NOT DETECTED Final   Respiratory Syncytial Virus NOT DETECTED NOT DETECTED Final    Bordetella pertussis NOT DETECTED NOT DETECTED Final   Chlamydophila pneumoniae NOT DETECTED NOT DETECTED Final   Mycoplasma pneumoniae NOT DETECTED NOT DETECTED Final    Comment: Performed at [facility], [address] [place], [place] [address]  Blood Culture (routine x 2)     Status: None (Preliminary result)   Collection Time: 08/26/20 10:38 PM   Specimen: BLOOD  Result Value Ref Range Status   Specimen Description   Final    BLOOD LEFT HAND Performed at [facility], [address] [place], [place] [address]    Special Requests   Final    BOTTLES DRAWN AEROBIC AND ANAEROBIC Blood Culture results may not be optimal due to an inadequate volume of blood received in culture bottles Performed at [facility], [address], [place], [place] [address]    Culture   Final    NO GROWTH 2 DAYS Performed at [facility], [address], [place], [place] [address]    Report Status PENDING  Incomplete  MRSA PCR Screening     Status: None   Collection Time: 08/27/20 11:14 AM   Specimen: Nasopharyngeal  Result Value Ref Range Status   MRSA by PCR NEGATIVE NEGATIVE Final    Comment:        The GeneXpert MRSA Assay (FDA approved for NASAL specimens only), is one component of a comprehensive MRSA colonization surveillance program. It is not intended to diagnose MRSA  infection nor to guide or monitor treatment for MRSA infections. Performed at [facility], [address] [place], [place] [id number]       Radiology Studies: No results found.     LOS: 2 days   Pegeen Stiger Rito EhrlichKrishnan  [facility] Pager on [email or web address]  08/29/2020, 10:36 AM

## 2020-08-29 NOTE — Evaluation (Signed)
Occupational Therapy Evaluation Patient Details Name: Jeremiah [name] MRN: [id number] DOB: 20-Aug-1946 Today's Date: 08/29/2020    History of Present Illness 74 y.o male presenting with hypoxia and fever from [facility]. Pt with recent admission September 16 and discharged on August 21 2020 after being admitted for respiratory failure secondary to Covid infection and also aspiration pneumonia with pleural effusion requiring thoracentesis and pigtail catheter placement. PMH includes diabetes mellitus, hypertension, chronic kidney disease stage III, peripheral vascular disease, right AKA, chronic anemia   Clinical Impression   Pt recently admitted with COVID as described above, presents again with hypoxia from SNF. Pt states he had slowly started working with therapy but was not out of his bed much. At time of eval, pt able to complete bed mobility with max A +2. Pt sat EOB ~10 mins to engage in oral care. During this time, he required intermittent max-max A +2 support to maintain sitting balance due to L posterior lateral lean. Once returned supine, pt incontinent of stool- requiring total A for peri care. Pt is able to roll with mod A to assist. Noted decreased awareness and problem solving throughout evaluation. VSS on RA. Given current status, recommend pt return to SNF for continued rehab prior to returning home. Will continue to follow per POC listed below.    Follow Up Recommendations  SNF;Supervision/Assistance - 24 hour    Equipment Recommendations  None recommended by OT    Recommendations for Other Services       Precautions / Restrictions Precautions Precautions: Fall Precaution Comments: R AKA Restrictions Weight Bearing Restrictions: No      Mobility Bed Mobility Overal bed mobility: Needs Assistance Bed Mobility: Rolling;Supine to Sit;Sit to Supine Rolling: Mod assist   Supine to sit: Max assist;+2 for safety/equipment Sit to supine: Max assist;+2 for  safety/equipment   General bed mobility comments: mod A to roll for peri care; max A +2 to sit EOB for LLE translation and trunk support to attain upright sitting. Strong L lateral lean  Transfers                      Balance Overall balance assessment: Needs assistance Sitting-balance support: Bilateral upper extremity supported;Feet unsupported Sitting balance-Leahy Scale: Poor Sitting balance - Comments: intermitttent max - max A+2 to maintain sitting EOB for oral care. Strong L psoterior- lateral lean Postural control: Posterior lean;Left lateral lean                                 ADL either performed or assessed with clinical judgement   ADL Overall ADL's : Needs assistance/impaired Eating/Feeding: Set up;Sitting;Bed level   Grooming: Supervision/safety;Set up;Sitting;Cueing for sequencing Grooming Details (indicate cue type and reason): cues needed to safely sequence rinsing/spitting cup Upper Body Bathing: Minimal assistance;Sitting   Lower Body Bathing: Maximal assistance;Sitting/lateral leans;Bed level   Upper Body Dressing : Minimal assistance;Sitting   Lower Body Dressing: Maximal assistance;Sitting/lateral leans;Bed level   Toilet Transfer: Maximal assistance;+2 for physical assistance;+2 for safety/equipment;Anterior/posterior   Toileting- Architect and Hygiene: Total assistance Toileting - Clothing Manipulation Details (indicate cue type and reason): total A rolling in bed due to bowel incontinence     Functional mobility during ADLs: Maximal assistance;+2 for physical assistance;+2 for safety/equipment General ADL Comments: pt requiring max A (intermittent +2 for safety) to sit EOB to perform grooming tasks this session     Vision Baseline Vision/History: Wears glasses  Wears Glasses: At all times Patient Visual Report: No change from baseline       Perception     Praxis      Pertinent Vitals/Pain Pain Assessment:  No/denies pain     Hand Dominance     Extremity/Trunk Assessment Upper Extremity Assessment Upper Extremity Assessment: Generalized weakness   Lower Extremity Assessment Lower Extremity Assessment: Generalized weakness;RLE deficits/detail RLE Deficits / Details: R AKA baseline       Communication Communication Communication: No difficulties   Cognition Arousal/Alertness: Awake/alert Behavior During Therapy: WFL for tasks assessed/performed Overall Cognitive Status: No family/caregiver present to determine baseline cognitive functioning Area of Impairment: Safety/judgement;Awareness;Problem solving                         Safety/Judgement: Decreased awareness of deficits Awareness: Emergent Problem Solving: Slow processing;Difficulty sequencing;Requires verbal cues;Requires tactile cues;Decreased initiation General Comments: increased cues needed to successfully sequence grooming task. Overall poor awareness of deficits (leaning L when sitting EOB and unaware). Poor awareness of his incontinence   General Comments       Exercises     Shoulder Instructions      Home Living Family/patient expects to be discharged to:: Skilled nursing facility                                 Additional Comments: Pt is from [facility], recently admitted. Had started some therapy, but states "I mostly just lay in the bed"      Prior Functioning/Environment Level of Independence: Needs assistance  Gait / Transfers Assistance Needed: recently has not been t/fing often at facility. At baseline uses manual w/c and slide board ADL's / Homemaking Assistance Needed: currently has assist from facility staff, prior was able to do own toileting and dressing with as needed assist for bathing            OT Problem List: Decreased strength;Decreased range of motion;Decreased activity tolerance;Impaired balance (sitting and/or standing);Decreased knowledge of use of DME or  AE;Decreased knowledge of precautions;Decreased safety awareness;Decreased cognition      OT Treatment/Interventions: Self-care/ADL training;Therapeutic exercise;Energy conservation;DME and/or AE instruction;Therapeutic activities;Patient/family education    OT Goals(Current goals can be found in the care plan section) Acute Rehab OT Goals Patient Stated Goal: return to independence OT Goal Formulation: With patient Time For Goal Achievement: 09/12/20 Potential to Achieve Goals: Good  OT Frequency: Min 2X/week   Barriers to D/C:            Co-evaluation PT/OT/SLP Co-Evaluation/Treatment: Yes Reason for Co-Treatment: For patient/therapist safety;To address functional/ADL transfers   OT goals addressed during session: ADL's and self-care;Strengthening/ROM      AM-PAC OT "6 Clicks" Daily Activity     Outcome Measure Help from another person eating meals?: A Little Help from another person taking care of personal grooming?: A Little Help from another person toileting, which includes using toliet, bedpan, or urinal?: A Lot Help from another person bathing (including washing, rinsing, drying)?: A Lot Help from another person to put on and taking off regular upper body clothing?: A Little Help from another person to put on and taking off regular lower body clothing?: A Lot 6 Click Score: 15   End of Session Nurse Communication: Mobility status  Activity Tolerance: Patient tolerated treatment well Patient left: in bed;with call bell/phone within reach  OT Visit Diagnosis: Unsteadiness on feet (R26.81);Other abnormalities of gait and mobility (  R26.89);Muscle weakness (generalized) (M62.81)                Time: 1093-2355 OT Time Calculation (min): 37 min Charges:  OT General Charges $OT Visit: 1 Visit OT Evaluation $OT Eval Moderate Complexity: 1 Mod  Dalphine Handing, MSOT, OTR/L Acute Rehabilitation Services [facility] Office Number: [phone] Pager: [phone]  Dalphine Handing 08/29/2020, 5:22 PM

## 2020-08-30 LAB — CBC WITH DIFFERENTIAL/PLATELET
Abs Immature Granulocytes: 0.03 10*3/uL (ref 0.00–0.07)
Basophils Absolute: 0.1 10*3/uL (ref 0.0–0.1)
Basophils Relative: 1 %
Eosinophils Absolute: 0.5 10*3/uL (ref 0.0–0.5)
Eosinophils Relative: 5 %
HCT: 23.9 % — ABNORMAL LOW (ref 39.0–52.0)
Hemoglobin: 7.7 g/dL — ABNORMAL LOW (ref 13.0–17.0)
Immature Granulocytes: 0 %
Lymphocytes Relative: 14 %
Lymphs Abs: 1.3 10*3/uL (ref 0.7–4.0)
MCH: 27.3 pg (ref 26.0–34.0)
MCHC: 32.2 g/dL (ref 30.0–36.0)
MCV: 84.8 fL (ref 80.0–100.0)
Monocytes Absolute: 1.1 10*3/uL — ABNORMAL HIGH (ref 0.1–1.0)
Monocytes Relative: 12 %
Neutro Abs: 6.4 10*3/uL (ref 1.7–7.7)
Neutrophils Relative %: 68 %
Platelets: 271 10*3/uL (ref 150–400)
RBC: 2.82 MIL/uL — ABNORMAL LOW (ref 4.22–5.81)
RDW: 15.5 % (ref 11.5–15.5)
WBC: 9.4 10*3/uL (ref 4.0–10.5)
nRBC: 0 % (ref 0.0–0.2)

## 2020-08-30 LAB — BASIC METABOLIC PANEL
Anion gap: 9 (ref 5–15)
BUN: 34 mg/dL — ABNORMAL HIGH (ref 8–23)
CO2: 25 mmol/L (ref 22–32)
Calcium: 8.5 mg/dL — ABNORMAL LOW (ref 8.9–10.3)
Chloride: 103 mmol/L (ref 98–111)
Creatinine, Ser: 1.16 mg/dL (ref 0.61–1.24)
GFR calc non Af Amer: >60 mL/min (ref >60)
Glucose, Bld: 126 mg/dL — ABNORMAL HIGH (ref 70–99)
Potassium: 4.1 mmol/L (ref 3.5–5.1)
Sodium: 137 mmol/L (ref 135–145)

## 2020-08-30 LAB — GLUCOSE, CAPILLARY
Glucose-Capillary: 119 mg/dL — ABNORMAL HIGH (ref 70–99)
Glucose-Capillary: 138 mg/dL — ABNORMAL HIGH (ref 70–99)
Glucose-Capillary: 114 mg/dL — ABNORMAL HIGH (ref 70–99)

## 2020-08-30 MED ORDER — SACCHAROMYCES BOULARDII 250 MG PO CAPS: 250.0000 mg | ORAL_CAPSULE | Freq: Two times a day (BID) | ORAL | Status: AC

## 2020-08-30 MED ORDER — HYDRALAZINE HCL 100 MG PO TABS: 50.0000 mg | ORAL_TABLET | Freq: Two times a day (BID) | ORAL | Status: AC

## 2020-08-30 MED ORDER — AZITHROMYCIN 500 MG PO TABS: 500.0000 mg | ORAL_TABLET | Freq: Every day | ORAL | 0 refills | Status: AC

## 2020-08-30 MED ORDER — ENSURE ENLIVE PO LIQD: 237.0000 mL | Freq: Three times a day (TID) | ORAL | 12 refills | Status: DC

## 2020-08-30 MED ORDER — AMOXICILLIN-POT CLAVULANATE 875-125 MG PO TABS: 1.0000 | ORAL_TABLET | Freq: Two times a day (BID) | ORAL | 0 refills | Status: AC

## 2020-08-30 MED ORDER — METOPROLOL SUCCINATE ER 25 MG PO TB24: 25.0000 mg | ORAL_TABLET | Freq: Every day | ORAL | 0 refills | Status: AC

## 2020-08-30 NOTE — Discharge Instructions (Signed)
Pleural Effusion Pleural effusion is an abnormal buildup of fluid in the layers of tissue between the lungs and the inside of the chest (pleural space) The two layers of tissue that line the lungs and the inside of the chest are called pleura. Usually, there is no air in the space between the pleura, only a thin layer of fluid. Some conditions can cause a large amount of fluid to build up, which can cause the lung to collapse if untreated. A pleural effusion is usually caused by another disease that requires treatment. What are the causes? Pleural effusion can be caused by:  Heart failure.  Certain infections, such as pneumonia or tuberculosis.  Cancer.  A blood clot in the lung (pulmonary embolism).  Complications from surgery, such as from open heart surgery.  Liver disease (cirrhosis).  Kidney disease. What are the signs or symptoms? In some cases, pleural effusion may cause no symptoms. If symptoms are present, they may include:  Shortness of breath, especially when lying down.  Chest pain. This may get worse when taking a deep breath.  Fever.  Dry, long-lasting (chronic) cough.  Hiccups.  Rapid breathing. An underlying condition that is causing the pleural effusion (such as heart failure, pneumonia, blood clots, tuberculosis, or cancer) may also cause other symptoms. How is this diagnosed? This condition may be diagnosed based on:  Your symptoms and medical history.  A physical exam.  A chest X-ray.  A procedure to use a needle to remove fluid from the pleural space (thoracentesis). This fluid is tested.  Other imaging studies of the chest, such as ultrasound or CT scan. How is this treated? Depending on the cause of your condition, treatment may include:  Treating the underlying condition that is causing the effusion. When that condition improves, the effusion will also improve. Examples of treatment for underlying conditions include: ? Antibiotic medicines to  treat an infection. ? Diuretics or other heart medicines to treat heart failure.  Thoracentesis.  Placing a thin flexible tube under your skin and into your chest to continuously drain the effusion (indwelling pleural catheter).  Surgery to remove the outer layer of tissue from the pleural space (decortication).  A procedure to put medicine into the chest cavity to seal the pleural space and prevent fluid buildup (pleurodesis).  Chemotherapy and radiation therapy, if you have cancerous (malignant) pleural effusion. These treatments are typically used to treat cancer. They kill certain cells in the body. Follow these instructions at home:  Take over-the-counter and prescription medicines only as told by your health care provider.  Ask your health care provider what activities are safe for you.  Keep track of how long you are able to do mild exercise (such as walking) before you get short of breath. Write down this information to share with your health care provider. Your ability to exercise should improve over time.  Do not use any products that contain nicotine or tobacco, such as cigarettes and e-cigarettes. If you need help quitting, ask your health care provider.  Keep all follow-up visits as told by your health care provider. This is important. Contact a health care provider if:  The amount of time that you are able to do mild exercise: ? Decreases. ? Does not improve with time.  You have a fever. Get help right away if:  You are short of breath.  You develop chest pain.  You develop a new cough. Summary  Pleural effusion is an abnormal buildup of fluid in the layers   of tissue between the lungs and the inside of the chest.  Pleural effusion can have many causes, including heart failure, pulmonary embolism, infections, or cancer.  Symptoms of pleural effusion can include shortness of breath, chest pain, fever, long-lasting (chronic) cough, hiccups, or rapid  breathing.  Diagnosis often involves making images of the chest (such as with ultrasound or X-ray) and removing fluid (thoracentesis) to send for testing.  Treatment for pleural effusion depends on what underlying condition is causing it. This information is not intended to replace advice given to you by your health care provider. Make sure you discuss any questions you have with your health care provider. Document Revised: 10/23/2017 Document Reviewed: 07/16/2017 Elsevier Patient Education  2020 [employer]  

## 2020-08-30 NOTE — TOC Transition Note (Signed)
Transition of Care [facility]) - CM/SW Discharge Note   Patient Details  Name: Jeremiah [name] MRN: [id number] Date of Birth: [birth year]  Transition of Care [id number]) CM/SW Contact:  Mearl Latin, LCSW Phone Number: 08/30/2020, 2:13 PM   Clinical Narrative:    Patient will DC to: [facility]  Anticipated DC date: 08/30/20 Family notified: Son, [name] and spouse Transport by: Sharin Mons   Per MD patient ready for DC to [facility]. RN to call report prior to discharge [phone]). RN, patient, patient's family, and facility notified of DC. Discharge Summary and FL2 sent to facility. DC packet on chart. Ambulance transport requested for patient.   CSW will sign off for now as social work intervention is no longer needed. Please consult [place] again if new needs arise.      Final next level of care: Skilled Nursing Facility Barriers to Discharge: Barriers Resolved   Patient Goals and CMS Choice        Discharge Placement   Existing PASRR number confirmed : 08/30/20          Patient [facility] bed at: [facility] Patient to be transferred to facility by: [facility] Name of family member notified: Spouse and son, [name] Patient and family notified of of transfer: 08/30/20  Discharge Plan and Services                                     Social Determinants of Health (SDOH) Interventions     Readmission Risk Interventions Readmission Risk Prevention Plan 08/13/2020 07/08/2018 07/08/2018  Transportation Screening Complete - Complete  Medication Review [employer]) Referral to Pharmacy - Complete  PCP or Specialist appointment within 3-5 days of discharge Complete - Not Complete  HRI or Home Care Consult Complete (No Data) Complete  SW Recovery Care/Counseling Consult Complete Patient refused Not Complete  Palliative Care Screening Not Applicable - Not Complete  Comments - - (No Data)  Medication Reconcilation (Pharmacy) - - Not Complete  Skilled Nursing Facility Complete - Patient  refused  Some recent data might be hidden

## 2020-08-30 NOTE — Plan of Care (Signed)
  Problem: Education: Goal: Knowledge of risk factors and measures for prevention of condition will improve Outcome: Progressing   Problem: Respiratory: Goal: Will maintain a patent airway Outcome: Progressing   Problem: Education: Goal: Knowledge of General Education information will improve Description: Including pain rating scale, medication(s)/side effects and non-pharmacologic comfort measures Outcome: Progressing   Problem: Health Behavior/Discharge Planning: Goal: Ability to manage health-related needs will improve Outcome: Progressing   Problem: Clinical Measurements: Goal: Diagnostic test results will improve Outcome: Progressing Goal: Respiratory complications will improve Outcome: Progressing   Problem: Activity: Goal: Risk for activity intolerance will decrease Outcome: Progressing   Problem: Safety: Goal: Ability to remain free from injury will improve Outcome: Progressing   Problem: Skin Integrity: Goal: Risk for impaired skin integrity will decrease Outcome: Progressing

## 2020-08-30 NOTE — Care Management Important Message (Signed)
Important Message  Patient Details  Name: Jeremiah [name] MRN: [id number] Date of Birth: [birth year]   Medicare Important Message Given:  Yes - Important Message mailed due to current National Emergency  Verbal consent obtained due to current National Emergency  Relationship to patient: Self Contact Name: Veryl Winemiller Call Date: 08/30/20  Time: 1459 Phone: [phone] Outcome: No Answer/Busy Important Message mailed to: Patient address on file    Orson Aloe 08/30/2020, 2:59 PM

## 2020-08-30 NOTE — Discharge Summary (Signed)
[facility]  Physician Discharge Summary   Patient ID: Jeremiah [name] MRN: [id number] DOB/AGE: Oct 17, 1946 74 y.o.  Admit date: 08/26/2020 Discharge date: 08/30/2020  PCP: Annita Brod, MD  DISCHARGE DIAGNOSES:  Healthcare associated pneumonia versus aspiration pneumonia Bilateral pleural effusion Acute respiratory failure with hypoxia, resolved Recent COVID-19 pneumonia, no need for isolation any longer Chronic kidney disease stage III. Peripheral vascular disease Anemia of chronic disease Essential hypertension Diabetes mellitus type 2 Chronic sacral and heel wounds   RECOMMENDATIONS FOR OUTPATIENT FOLLOW UP: 1. Recommend checking chest x-ray in 1 week 2. No need to continue airborne or contact precautions for COVID-19.    Home Health: Patient going to SNF Equipment/Devices: None  CODE STATUS: Full code  DISCHARGE CONDITION: fair  Diet recommendation: Heart healthy, modified carbohydrate  INITIAL HISTORY: 74 y.o.malewithhistory of diabetes mellitus, hypertension, chronic kidney disease stage III, peripheral vascular disease, right AKA, chronic anemia who was recently admitted from September 16 and discharged on August 21 2020 after being admitted for respiratory failure secondary to Covid infection and also aspiration pneumonia with pleural effusion requiring thoracentesis and pigtail catheter placement which was subsequently removed and patient was discharged to skilled nursing facility. Per the report patient became hypoxic febrile and was saturating around 70% requiring 4 L oxygen to maintain sats.In the ER patient was hypoxic requiring 4 L oxygen chest x-ray showing worsening infiltrates bilaterally with pleural effusion and persistent right-sided hydropneumothorax. Patient was febrile with temperature 101 F labs were significant for WBC of 17.3 hemoglobin 7 creatinine 1.7 D-dimer 2.5 CRP 18.8. Patient had blood cultures drawn and started on empiric  antibiotics for pneumonia. Patient was hospitalized for further management.  Consultations:  Dr. Cliffton Asters with cardiothoracic surgery  Procedures:  None   HOSPITAL COURSE:   Acute respiratory failure with hypoxia/possible healthcare associated pneumonia Recently hospitalized for COVID-19 and was also found to have right-sided pleural effusion for which patient underwent thoracentesis as well as a pigtail catheter placement which was subsequently removed prior to discharge.  Patient was noted to be febrile at the time of admission. Hospitalized and placed on ceftriaxone azithromycin and vancomycin.  CT chest showed acute airspace disease with numerous small to moderate left-sided pleural effusion that appears to be loculated. Has stable chronic right pleural collection with gas and debris secondary to recent drainage. Mild interstitial edema was noted. Cardiothoracic surgery was consulted who recommended transfer to [facility]  Seen by Dr. Cliffton Asters who has evaluated patient's films.  Patient has in the meantime clinically improved.  He is no longer on oxygen.  His WBC has been normal.  In view of this it was felt that patient would not benefit from any procedures.  Will be considered somewhat of a risky proposition.   They did not recommend any interventions.  Discussed with patient's wife who understands.   MRSA PCR was negative.  Blood cultures were without any growth.  Vancomycin was discontinued.  Azithromycin for total of 5 days.  Patient transition from ceftriaxone to Augmentin.  WBC is normal today.  We will plan for a total of 10-day course of Augmentin. Recommend repeating chest x-ray in 1 week.  Recent COVID-19 pneumonia Patient has completed treatment for same. His current symptoms are not due to COVID-19. Tested positive on September 16.  Completed 3 weeks on 10/6.  He no longer has needs to be on isolation.    Acute on chronic kidney disease stage IIIb Looks like his baseline  creatinine is around 1.2. Presented with a  creatinine of 1.7. Improved to 1.16.    History of peripheral vascular disease Statin and antiplatelet agent to be continued. Underwent left lower extremity angiogram for critical limb ischemia in July. Stent was placed into the SFA. Continue Plavix.  Anemia of Chronic disease Hemoglobin was 7.7 in September. Came in with a hemoglobin of 7.0 dropped to 6.6. Was transfused. Improved to 7.8.    Hemoglobin 7.7 today.  Essential hypertension Resume home medications  Acute metabolic encephalopathy Unclear if the patient has a history of cognitive impairment. He is not delirious currently. Slow to respond at times. Seems to be mildly confused. No focal deficits noted.  Mentation is stable.  Diabetes mellitus type 2 Noted to be on Amaryl prior to admission.  CBGs are reasonably well controlled. A1c was 6.0 in September.  Chronic sacral and heel wounds Wound care. Pressure Injury 08/27/20 Heel Left Deep Tissue Pressure Injury - Purple or maroon localized area of discolored intact skin or blood-filled blister due to damage of underlying soft tissue from pressure and/or shear. (Active)  08/27/20 1033  Location: Heel  Location Orientation: Left  Staging: Deep Tissue Pressure Injury - Purple or maroon localized area of discolored intact skin or blood-filled blister due to damage of underlying soft tissue from pressure and/or shear.  Wound Description (Comments):   Present on Admission: Yes     Pressure Injury 08/27/20 Sacrum Mid Stage 2 -  Partial thickness loss of dermis presenting as a shallow open injury with a red, pink wound bed without slough. (Active)  08/27/20 1033  Location: Sacrum  Location Orientation: Mid  Staging: Stage 2 -  Partial thickness loss of dermis presenting as a shallow open injury with a red, pink wound bed without slough.  Wound Description (Comments):   Present on Admission: Yes    Overall patient remains stable.   Okay for discharge home today.   PERTINENT LABS:  The results of significant diagnostics from this hospitalization (including imaging, microbiology, ancillary and laboratory) are listed below for reference.    Microbiology: Recent Results (from the past 240 hour(s))  Blood Culture (routine x 2)     Status: None (Preliminary result)   Collection Time: 08/26/20 10:33 PM   Specimen: BLOOD  Result Value Ref Range Status   Specimen Description   Final    BLOOD LEFT WRIST Performed at [facility], [address] [place], [place] [address]    Special Requests   Final    BOTTLES DRAWN AEROBIC AND ANAEROBIC Blood Culture results may not be optimal due to an inadequate volume of blood received in culture bottles Performed at [facility], [address], [place], [place] [address]    Culture   Final    NO GROWTH 2 DAYS Performed at [facility], [address], [place], [place] [address]    Report Status PENDING  Incomplete  Respiratory Panel by PCR     Status: None   Collection Time: 08/26/20 10:34 PM   Specimen: Nasopharyngeal Swab; Respiratory  Result Value Ref Range Status   Adenovirus NOT DETECTED NOT DETECTED Final   Coronavirus 229E NOT DETECTED NOT DETECTED Final    Comment: (NOTE) The Coronavirus on the Respiratory Panel, DOES NOT test for the novel  Coronavirus (2019 nCoV)    Coronavirus HKU1 NOT DETECTED NOT DETECTED Final   Coronavirus NL63 NOT DETECTED NOT DETECTED Final   Coronavirus OC43 NOT DETECTED NOT DETECTED Final   Metapneumovirus NOT DETECTED NOT DETECTED Final  Rhinovirus / Enterovirus NOT DETECTED NOT DETECTED Final   Influenza A NOT DETECTED NOT DETECTED Final   Influenza B NOT DETECTED NOT DETECTED Final   Parainfluenza Virus 1 NOT DETECTED NOT DETECTED Final   Parainfluenza Virus 2 NOT DETECTED NOT DETECTED Final   Parainfluenza Virus 3 NOT DETECTED NOT DETECTED Final   Parainfluenza Virus 4 NOT DETECTED NOT  DETECTED Final   Respiratory Syncytial Virus NOT DETECTED NOT DETECTED Final   Bordetella pertussis NOT DETECTED NOT DETECTED Final   Chlamydophila pneumoniae NOT DETECTED NOT DETECTED Final   Mycoplasma pneumoniae NOT DETECTED NOT DETECTED Final    Comment: Performed at [facility], [address] [place], [place] [address]  Blood Culture (routine x 2)     Status: None (Preliminary result)   Collection Time: 08/26/20 10:38 PM   Specimen: BLOOD  Result Value Ref Range Status   Specimen Description   Final    BLOOD LEFT HAND Performed at [facility], [address], [place], [place] [address]    Special Requests   Final    BOTTLES DRAWN AEROBIC AND ANAEROBIC Blood Culture results may not be optimal due to an inadequate volume of blood received in culture bottles Performed at [facility], [address], [place], [place] [address]    Culture   Final    NO GROWTH 2 DAYS Performed at [facility], [address], [place], [place] [address]    Report Status PENDING  Incomplete  MRSA PCR Screening     Status: None   Collection Time: 08/27/20 11:14 AM   Specimen: Nasopharyngeal  Result Value Ref Range Status   MRSA by PCR NEGATIVE NEGATIVE Final    Comment:        The GeneXpert MRSA Assay (FDA approved for NASAL specimens only), is one component of a comprehensive MRSA colonization surveillance program. It is not intended to diagnose MRSA infection nor to guide or monitor treatment for MRSA infections. Performed at [facility], [address] [place], [place] [address]      Labs:    Basic Metabolic Panel: Recent Labs  Lab 08/26/20 2233 08/27/20 0307 08/28/20 0258 08/30/20 0141  NA 137 134* 136 137  K 4.3 4.0 4.1 4.1  CL 101 101 103 103  CO2 23 23 21* 25  GLUCOSE 131* 135* 85 126*  BUN 30* 32* 29* 34*  CREATININE 1.70* 1.67* 1.46* 1.16  CALCIUM 8.8* 8.1* 8.2* 8.5*  MG  --   --  2.0  --     PHOS  --   --  2.9  --    Liver Function Tests: Recent Labs  Lab 08/26/20 2233  AST 13*  ALT 14  ALKPHOS 54  BILITOT 0.9  PROT 6.5  ALBUMIN 2.3*   CBC: Recent Labs  Lab 08/26/20 2233 08/27/20 0307 08/28/20 0258 08/30/20 0141  WBC 17.3* 13.8* 9.9 9.4  NEUTROABS 13.7*  --  7.0 6.4  HGB 7.0* 6.6* 7.8* 7.7*  HCT 21.2* 20.2* 24.5* 23.9*  MCV 86.2 87.1 85.1 84.8  PLT 237 220 220 271    CBG: Recent Labs  Lab 08/29/20 0753 08/29/20 1222 08/29/20 1623 08/29/20 2058 08/30/20 0741  GLUCAP 139* 210* 162* 143* 119*     IMAGING STUDIES  CT CHEST WO CONTRAST  Result Date: 08/27/2020 CLINICAL DATA:  Persistent cough EXAM: CT CHEST WITHOUT CONTRAST TECHNIQUE: Multidetector CT imaging of the chest was performed following the standard protocol without  IV contrast. COMPARISON:  08/19/2020 FINDINGS: Cardiovascular: Mildly enlarged heart. Aortic and coronary atherosclerosis. Mediastinum/Nodes: Large mediastinal lymph nodes, especially subcarinal, considered reactive given the extent of pulmonary findings. Prominent thickness of the mid to lower esophagus, not well assessed on this noncontrast chest CT Lungs/Pleura: Chronic right pleural collection with recent drainage. The cavity is of similar size with internal gas and fluid/debris. There is a new left pleural effusion which shows signs of loculation posteriorly at the level of the fissure. Scattered irregular nodules in the right upper lobe, acute and inflammatory. Airspace disease and atelectasis now seen in the left lung. Upper Abdomen: No acute finding Musculoskeletal: Spondylosis. No acute or aggressive finding. IMPRESSION: 1. Acute airspace disease with new small to moderate left pleural effusion that is loculated appearing. 2. Stable chronic right pleural collection with gas and debris secondary to recent drainage. 3. Mild interstitial edema. Electronically Signed   By: Marnee SpringJonathon  Watts M.D.   On: 08/27/2020 04:16   DG Chest Port 1  View  Result Date: 08/26/2020 CLINICAL DATA:  COVID pneumonia. EXAM: PORTABLE CHEST 1 VIEW COMPARISON:  08/20/2020 FINDINGS: There are worsening bilateral hazy airspace opacities. There are growing bilateral pleural effusions with a persistent right-sided hydropneumothorax. No evidence for left-sided pneumothorax. The heart size remains stable. There is no acute osseous abnormality. Atherosclerotic changes are again noted. IMPRESSION: 1. Worsening multifocal airspace opacities. 2. Persistent right-sided hydropneumothorax. 3. Worsening bilateral pleural effusions. Electronically Signed   By: Katherine Mantlehristopher  Green M.D.   On: 08/26/2020 22:51     DISCHARGE EXAMINATION: Vitals:   08/29/20 2055 08/30/20 0557 08/30/20 0720 08/30/20 0743  BP: (!) 119/55 (!) 129/58  (!) 135/58  Pulse: 75 69  70  Resp: 15 15 18 18   Temp: 99.3 F (37.4 C) 99.1 F (37.3 C)  99.1 F (37.3 C)  TempSrc: Oral Axillary  Oral  SpO2: 96% 100% 100% 100%  Weight:       General appearance: Awake alert.  In no distress Resp: Dullness to percussion at the bases bilaterally.  Few crackles at the bases as well.  Otherwise clear to auscultation. Cardio: S1-S2 is normal regular.  No S3-S4.  No rubs murmurs or bruit GI: Abdomen is soft.  Nontender nondistended.  Bowel sounds are present normal.  No masses organomegaly     DISPOSITION: Back to SNF  Discharge Instructions    Call MD for:  difficulty breathing, headache or visual disturbances   Complete by: As directed    Call MD for:  extreme fatigue   Complete by: As directed    Call MD for:  persistant dizziness or light-headedness   Complete by: As directed    Call MD for:  persistant nausea and vomiting   Complete by: As directed    Call MD for:  severe uncontrolled pain   Complete by: As directed    Call MD for:  temperature >100.4   Complete by: As directed    Diet - low sodium heart healthy   Complete by: As directed    Diet Carb Modified   Complete by: As  directed    Discharge instructions   Complete by: As directed    Please review instructions on the discharge summary.  You were cared for by a hospitalist during your hospital stay. If you have any questions about your discharge medications or the care you received while you were in the hospital after you are discharged, you can call the unit and asked to speak with the hospitalist on  call if the hospitalist that took care of you is not available. Once you are discharged, your primary care physician will handle any further medical issues. Please note that NO REFILLS for any discharge medications will be authorized once you are discharged, as it is imperative that you return to your primary care physician (or establish a relationship with a primary care physician if you do not have one) for your aftercare needs so that they can reassess your need for medications and monitor your lab values. If you do not have a primary care physician, you can call [phone] for a physician referral.   Discharge wound care:   Complete by: As directed    Foam dressing  Every 3 days     Comments: Silicone foam dressings to the sacrum change every 3 days. Assess under dressings each shift for any acute changes in the wounds.  Wound care  Every shift      Comments: Paint heel wound with betadine, allow to air dry. Offload heel at all times.   Increase activity slowly   Complete by: As directed         Allergies as of 08/30/2020   No Known Allergies     Medication List    STOP taking these medications   traMADol 50 MG tablet Commonly known as: ULTRAM     TAKE these medications   acetaminophen 500 MG tablet Commonly known as: TYLENOL Take 1,000 mg by mouth every 6 (six) hours as needed for moderate pain or headache.   amLODipine 10 MG tablet Commonly known as: NORVASC Take 10 mg by mouth daily.   amoxicillin-clavulanate 875-125 MG tablet Commonly known as: AUGMENTIN Take 1 tablet by mouth every 12  (twelve) hours for 8 days.   aspirin 81 MG tablet Take 81 mg by mouth daily.   atorvastatin 20 MG tablet Commonly known as: LIPITOR Take 1 tablet (20 mg total) by mouth daily.   azithromycin 500 MG tablet Commonly known as: ZITHROMAX Take 1 tablet (500 mg total) by mouth daily for 2 days. Start taking on: August 31, 2020   brimonidine 0.2 % ophthalmic solution Commonly known as: ALPHAGAN Place 1 drop into both eyes 2 (two) times daily.   clopidogrel 75 MG tablet Commonly known as: PLAVIX Take 1 tablet (75 mg total) by mouth daily with breakfast.   cyanocobalamin 1000 MCG tablet Take 1 tablet (1,000 mcg total) by mouth daily.   feeding supplement (ENSURE ENLIVE) Liqd Take 237 mLs by mouth 3 (three) times daily between meals.   ferrous sulfate 325 (65 FE) MG tablet Take 1 tablet (325 mg total) by mouth 2 (two) times daily with a meal.   gabapentin 300 MG capsule Commonly known as: NEURONTIN Take 1 capsule (300 mg total) by mouth 2 (two) times daily.   glimepiride 1 MG tablet Commonly known as: AMARYL Take 1 tablet (1 mg total) by mouth daily with breakfast.   glucose blood test strip Accu-Chek Aviva Plus test strips  Take 1 strip 3 times a day by miscell. route for 90 days.   glucose blood test strip Accu-Chek Aviva Plus test strips  USE AS DIRECTED THREE TIMES DAILY.   hydrALAZINE 100 MG tablet Commonly known as: APRESOLINE Take 0.5 tablets (50 mg total) by mouth 2 (two) times daily.   metoprolol succinate 25 MG 24 hr tablet Commonly known as: TOPROL-XL Take 1 tablet (25 mg total) by mouth daily.   pantoprazole 40 MG tablet Commonly known as: PROTONIX Take 1  tablet (40 mg total) by mouth daily.   saccharomyces boulardii 250 MG capsule Commonly known as: FLORASTOR Take 1 capsule (250 mg total) by mouth 2 (two) times daily for 14 days.   timolol 0.5 % ophthalmic solution Commonly known as: TIMOPTIC Place 1 drop into both eyes 2 (two) times daily.    VITAMIN C PO Take 2 tablets by mouth daily.   ZINC SULFATE PO Take 1 tablet by mouth daily. Taking for 3 weeks. Stops 10/21            Discharge Care Instructions  (From admission, onward)         Start     Ordered   08/30/20 0000  Discharge wound care:       Comments: Foam dressing  Every 3 days     Comments: Silicone foam dressings to the sacrum change every 3 days. Assess under dressings each shift for any acute changes in the wounds.  Wound care  Every shift      Comments: Paint heel wound with betadine, allow to air dry. Offload heel at all times.   08/30/20 0941            Follow-up Information    Annita Brod, MD. Schedule an appointment as soon as possible for a visit in 1 week(s).   Specialty: Internal Medicine Contact information: [address] [place] [address] [phone]               TOTAL DISCHARGE TIME: 35 minutes  Ressie Slevin Rito Ehrlich  [facility] Pager on [email or web address]  08/30/2020, 9:41 AM

## 2020-08-30 NOTE — TOC Progression Note (Signed)
Transition of Care [facility]) - Progression Note    Patient Details  Name: Jeremiah [name] MRN: [id number] Date of Birth: August 02, 1946  Transition of Care [facility]) CM/SW Contact  [place] Pink, Student-Social Work Phone Number: 08/30/2020, 11:57 AM  Clinical Narrative:    CSW intern updated patient's wife and son on discharge plan. CSW intern informed them that patient will be discharged via [facility] to [facility] today. Wife and son expressed understanding and agreement. CSW faxed clinicals to insurance- awaiting approval for discharge letter today.         Expected Discharge Plan and Services           Expected Discharge Date: 08/30/20                                     Social Determinants of Health (SDOH) Interventions    Readmission Risk Interventions Readmission Risk Prevention Plan 08/13/2020 07/08/2018 07/08/2018  Transportation Screening Complete - Complete  Medication Review [employer]) Referral to Pharmacy - Complete  PCP or Specialist appointment within 3-5 days of discharge Complete - Not Complete  HRI or Home Care Consult Complete (No Data) Complete  SW Recovery Care/Counseling Consult Complete Patient refused Not Complete  Palliative Care Screening Not Applicable - Not Complete  Comments - - (No Data)  Medication Reconcilation (Pharmacy) - - Not Complete  Skilled Nursing Facility Complete - Patient refused  Some recent data might be hidden

## 2020-08-30 NOTE — Progress Notes (Addendum)
Patient was discharged to [facility] by MD order; discharged instructions sent with pt to [facility] IV DIC; patient will be transported by [facility] via stretcher. Report given to Wyatt Mage RN [phone]. No distress noted, no c/o discomfort from pt. All scheduled meds are given to patient per ordered. All dressings changed C/D/I. Family is aware of pt being charged back to [facility].

## 2020-09-01 LAB — CULTURE, BLOOD (ROUTINE X 2)
Culture: NO GROWTH
Culture: NO GROWTH

## 2020-09-07 ENCOUNTER — Ambulatory Visit: Payer: Medicare Other | Admitting: Vascular Surgery

## 2020-09-14 ENCOUNTER — Inpatient Hospital Stay (HOSPITAL_COMMUNITY): Payer: Medicare Other

## 2020-09-14 ENCOUNTER — Other Ambulatory Visit: Payer: Self-pay

## 2020-09-14 ENCOUNTER — Inpatient Hospital Stay (HOSPITAL_COMMUNITY)
Admission: EM | Admit: 2020-09-14 | Discharge: 2020-09-25 | DRG: 208 | Disposition: A | Discharge status: Skilled Nursing Facility | Payer: Medicare Other | Source: Skilled Nursing Facility | Attending: Family Medicine | Admitting: Family Medicine

## 2020-09-14 ENCOUNTER — Emergency Department (HOSPITAL_COMMUNITY): Payer: Medicare Other

## 2020-09-14 DIAGNOSIS — I5032 Chronic diastolic (congestive) heart failure: Secondary | ICD-10-CM | POA: Diagnosis not present

## 2020-09-14 DIAGNOSIS — N1832 Chronic kidney disease, stage 3b: Secondary | ICD-10-CM | POA: Diagnosis present

## 2020-09-14 DIAGNOSIS — L89620 Pressure ulcer of left heel, unstageable: Secondary | ICD-10-CM | POA: Diagnosis present

## 2020-09-14 DIAGNOSIS — I5043 Acute on chronic combined systolic (congestive) and diastolic (congestive) heart failure: Secondary | ICD-10-CM | POA: Diagnosis present

## 2020-09-14 DIAGNOSIS — Z8616 Personal history of COVID-19: Secondary | ICD-10-CM

## 2020-09-14 DIAGNOSIS — L89152 Pressure ulcer of sacral region, stage 2: Secondary | ICD-10-CM | POA: Diagnosis present

## 2020-09-14 DIAGNOSIS — Z9049 Acquired absence of other specified parts of digestive tract: Secondary | ICD-10-CM

## 2020-09-14 DIAGNOSIS — R0600 Dyspnea, unspecified: Secondary | ICD-10-CM

## 2020-09-14 DIAGNOSIS — J9811 Atelectasis: Secondary | ICD-10-CM | POA: Diagnosis present

## 2020-09-14 DIAGNOSIS — R0902 Hypoxemia: Secondary | ICD-10-CM

## 2020-09-14 DIAGNOSIS — M109 Gout, unspecified: Secondary | ICD-10-CM | POA: Diagnosis present

## 2020-09-14 DIAGNOSIS — Z681 Body mass index (BMI) 19 or less, adult: Secondary | ICD-10-CM

## 2020-09-14 DIAGNOSIS — Z79891 Long term (current) use of opiate analgesic: Secondary | ICD-10-CM

## 2020-09-14 DIAGNOSIS — Z8249 Family history of ischemic heart disease and other diseases of the circulatory system: Secondary | ICD-10-CM

## 2020-09-14 DIAGNOSIS — L0390 Cellulitis, unspecified: Secondary | ICD-10-CM

## 2020-09-14 DIAGNOSIS — I700 Atherosclerosis of aorta: Secondary | ICD-10-CM | POA: Diagnosis present

## 2020-09-14 DIAGNOSIS — Z9689 Presence of other specified functional implants: Secondary | ICD-10-CM

## 2020-09-14 DIAGNOSIS — E874 Mixed disorder of acid-base balance: Secondary | ICD-10-CM | POA: Diagnosis present

## 2020-09-14 DIAGNOSIS — J9601 Acute respiratory failure with hypoxia: Secondary | ICD-10-CM | POA: Diagnosis present

## 2020-09-14 DIAGNOSIS — Y95 Nosocomial condition: Secondary | ICD-10-CM | POA: Diagnosis present

## 2020-09-14 DIAGNOSIS — I2721 Secondary pulmonary arterial hypertension: Secondary | ICD-10-CM | POA: Diagnosis present

## 2020-09-14 DIAGNOSIS — Z89611 Acquired absence of right leg above knee: Secondary | ICD-10-CM

## 2020-09-14 DIAGNOSIS — I2510 Atherosclerotic heart disease of native coronary artery without angina pectoris: Secondary | ICD-10-CM | POA: Diagnosis present

## 2020-09-14 DIAGNOSIS — I468 Cardiac arrest due to other underlying condition: Secondary | ICD-10-CM | POA: Diagnosis not present

## 2020-09-14 DIAGNOSIS — Z20822 Contact with and (suspected) exposure to covid-19: Secondary | ICD-10-CM | POA: Diagnosis present

## 2020-09-14 DIAGNOSIS — D638 Anemia in other chronic diseases classified elsewhere: Secondary | ICD-10-CM | POA: Diagnosis present

## 2020-09-14 DIAGNOSIS — R1310 Dysphagia, unspecified: Secondary | ICD-10-CM | POA: Diagnosis present

## 2020-09-14 DIAGNOSIS — L89312 Pressure ulcer of right buttock, stage 2: Secondary | ICD-10-CM | POA: Diagnosis not present

## 2020-09-14 DIAGNOSIS — N179 Acute kidney failure, unspecified: Principal | ICD-10-CM

## 2020-09-14 DIAGNOSIS — Z833 Family history of diabetes mellitus: Secondary | ICD-10-CM

## 2020-09-14 DIAGNOSIS — D62 Acute posthemorrhagic anemia: Secondary | ICD-10-CM | POA: Diagnosis present

## 2020-09-14 DIAGNOSIS — G9341 Metabolic encephalopathy: Secondary | ICD-10-CM | POA: Diagnosis present

## 2020-09-14 DIAGNOSIS — J869 Pyothorax without fistula: Secondary | ICD-10-CM

## 2020-09-14 DIAGNOSIS — F322 Major depressive disorder, single episode, severe without psychotic features: Secondary | ICD-10-CM | POA: Diagnosis present

## 2020-09-14 DIAGNOSIS — I130 Hypertensive heart and chronic kidney disease with heart failure and stage 1 through stage 4 chronic kidney disease, or unspecified chronic kidney disease: Secondary | ICD-10-CM | POA: Diagnosis present

## 2020-09-14 DIAGNOSIS — Z7189 Other specified counseling: Secondary | ICD-10-CM | POA: Diagnosis not present

## 2020-09-14 DIAGNOSIS — D631 Anemia in chronic kidney disease: Secondary | ICD-10-CM | POA: Diagnosis present

## 2020-09-14 DIAGNOSIS — J189 Pneumonia, unspecified organism: Secondary | ICD-10-CM | POA: Diagnosis present

## 2020-09-14 DIAGNOSIS — E43 Unspecified severe protein-calorie malnutrition: Secondary | ICD-10-CM | POA: Diagnosis present

## 2020-09-14 DIAGNOSIS — Z9289 Personal history of other medical treatment: Secondary | ICD-10-CM

## 2020-09-14 DIAGNOSIS — I313 Pericardial effusion (noninflammatory): Secondary | ICD-10-CM | POA: Diagnosis present

## 2020-09-14 DIAGNOSIS — Z452 Encounter for adjustment and management of vascular access device: Secondary | ICD-10-CM

## 2020-09-14 DIAGNOSIS — E785 Hyperlipidemia, unspecified: Secondary | ICD-10-CM | POA: Diagnosis present

## 2020-09-14 DIAGNOSIS — Z7982 Long term (current) use of aspirin: Secondary | ICD-10-CM

## 2020-09-14 DIAGNOSIS — E875 Hyperkalemia: Secondary | ICD-10-CM | POA: Diagnosis present

## 2020-09-14 DIAGNOSIS — I469 Cardiac arrest, cause unspecified: Secondary | ICD-10-CM | POA: Diagnosis not present

## 2020-09-14 DIAGNOSIS — Z515 Encounter for palliative care: Secondary | ICD-10-CM | POA: Diagnosis not present

## 2020-09-14 DIAGNOSIS — I878 Other specified disorders of veins: Secondary | ICD-10-CM | POA: Diagnosis present

## 2020-09-14 DIAGNOSIS — Z634 Disappearance and death of family member: Secondary | ICD-10-CM

## 2020-09-14 DIAGNOSIS — L89322 Pressure ulcer of left buttock, stage 2: Secondary | ICD-10-CM | POA: Diagnosis not present

## 2020-09-14 DIAGNOSIS — Z7902 Long term (current) use of antithrombotics/antiplatelets: Secondary | ICD-10-CM

## 2020-09-14 DIAGNOSIS — E1165 Type 2 diabetes mellitus with hyperglycemia: Secondary | ICD-10-CM | POA: Diagnosis present

## 2020-09-14 DIAGNOSIS — R069 Unspecified abnormalities of breathing: Secondary | ICD-10-CM

## 2020-09-14 DIAGNOSIS — E1142 Type 2 diabetes mellitus with diabetic polyneuropathy: Secondary | ICD-10-CM | POA: Diagnosis present

## 2020-09-14 DIAGNOSIS — D6489 Other specified anemias: Secondary | ICD-10-CM | POA: Diagnosis present

## 2020-09-14 DIAGNOSIS — I350 Nonrheumatic aortic (valve) stenosis: Secondary | ICD-10-CM | POA: Diagnosis present

## 2020-09-14 DIAGNOSIS — Z87891 Personal history of nicotine dependence: Secondary | ICD-10-CM

## 2020-09-14 DIAGNOSIS — J80 Acute respiratory distress syndrome: Secondary | ICD-10-CM | POA: Diagnosis present

## 2020-09-14 DIAGNOSIS — Z79899 Other long term (current) drug therapy: Secondary | ICD-10-CM

## 2020-09-14 DIAGNOSIS — I248 Other forms of acute ischemic heart disease: Secondary | ICD-10-CM | POA: Diagnosis present

## 2020-09-14 DIAGNOSIS — L8990 Pressure ulcer of unspecified site, unspecified stage: Secondary | ICD-10-CM | POA: Diagnosis present

## 2020-09-14 DIAGNOSIS — I739 Peripheral vascular disease, unspecified: Secondary | ICD-10-CM | POA: Diagnosis not present

## 2020-09-14 DIAGNOSIS — R627 Adult failure to thrive: Secondary | ICD-10-CM | POA: Diagnosis present

## 2020-09-14 DIAGNOSIS — J9690 Respiratory failure, unspecified, unspecified whether with hypoxia or hypercapnia: Secondary | ICD-10-CM

## 2020-09-14 DIAGNOSIS — R54 Age-related physical debility: Secondary | ICD-10-CM | POA: Diagnosis present

## 2020-09-14 DIAGNOSIS — E1151 Type 2 diabetes mellitus with diabetic peripheral angiopathy without gangrene: Secondary | ICD-10-CM | POA: Diagnosis present

## 2020-09-14 DIAGNOSIS — Z993 Dependence on wheelchair: Secondary | ICD-10-CM

## 2020-09-14 LAB — I-STAT VENOUS BLOOD GAS, ED
Acid-Base Excess: 5 mmol/L — ABNORMAL HIGH (ref 0.0–2.0)
Bicarbonate: 27.9 mmol/L (ref 20.0–28.0)
Calcium, Ion: 1.06 mmol/L — ABNORMAL LOW (ref 1.15–1.40)
HCT: 22 % — ABNORMAL LOW (ref 39.0–52.0)
Hemoglobin: 7.5 g/dL — ABNORMAL LOW (ref 13.0–17.0)
O2 Saturation: 98 %
Potassium: 5 mmol/L (ref 3.5–5.1)
Sodium: 138 mmol/L (ref 135–145)
TCO2: 29 mmol/L (ref 22–32)
pCO2, Ven: 33.2 mmHg — ABNORMAL LOW (ref 44.0–60.0)
pH, Ven: 7.532 — ABNORMAL HIGH (ref 7.250–7.430)
pO2, Ven: 85 mmHg — ABNORMAL HIGH (ref 32.0–45.0)

## 2020-09-14 LAB — BASIC METABOLIC PANEL
Anion gap: 10 (ref 5–15)
BUN: 48 mg/dL — ABNORMAL HIGH (ref 8–23)
CO2: 24 mmol/L (ref 22–32)
Calcium: 8.4 mg/dL — ABNORMAL LOW (ref 8.9–10.3)
Chloride: 102 mmol/L (ref 98–111)
Creatinine, Ser: 1.73 mg/dL — ABNORMAL HIGH (ref 0.61–1.24)
GFR, Estimated: 40 mL/min — ABNORMAL LOW (ref >60)
Glucose, Bld: 235 mg/dL — ABNORMAL HIGH (ref 70–99)
Potassium: 5.1 mmol/L (ref 3.5–5.1)
Sodium: 136 mmol/L (ref 135–145)

## 2020-09-14 LAB — CBC WITH DIFFERENTIAL/PLATELET
Abs Immature Granulocytes: 0.06 10*3/uL (ref 0.00–0.07)
Basophils Absolute: 0.1 10*3/uL (ref 0.0–0.1)
Basophils Relative: 1 %
Eosinophils Absolute: 0.1 10*3/uL (ref 0.0–0.5)
Eosinophils Relative: 1 %
HCT: 24.5 % — ABNORMAL LOW (ref 39.0–52.0)
Hemoglobin: 7.4 g/dL — ABNORMAL LOW (ref 13.0–17.0)
Immature Granulocytes: 1 %
Lymphocytes Relative: 13 %
Lymphs Abs: 1.5 10*3/uL (ref 0.7–4.0)
MCH: 27 pg (ref 26.0–34.0)
MCHC: 30.2 g/dL (ref 30.0–36.0)
MCV: 89.4 fL (ref 80.0–100.0)
Monocytes Absolute: 1.1 10*3/uL — ABNORMAL HIGH (ref 0.1–1.0)
Monocytes Relative: 9 %
Neutro Abs: 8.7 10*3/uL — ABNORMAL HIGH (ref 1.7–7.7)
Neutrophils Relative %: 75 %
Platelets: 312 10*3/uL (ref 150–400)
RBC: 2.74 MIL/uL — ABNORMAL LOW (ref 4.22–5.81)
RDW: 15.5 % (ref 11.5–15.5)
WBC: 11.5 10*3/uL — ABNORMAL HIGH (ref 4.0–10.5)
nRBC: 0 % (ref 0.0–0.2)

## 2020-09-14 LAB — RESPIRATORY PANEL BY RT PCR (FLU A&B, COVID)
Influenza A by PCR: NEGATIVE
Influenza B by PCR: NEGATIVE
SARS Coronavirus 2 by RT PCR: NEGATIVE

## 2020-09-14 LAB — TROPONIN I (HIGH SENSITIVITY)
Troponin I (High Sensitivity): 29 ng/L — ABNORMAL HIGH (ref <18)
Troponin I (High Sensitivity): 57 ng/L — ABNORMAL HIGH (ref <18)

## 2020-09-14 LAB — LACTIC ACID, PLASMA: Lactic Acid, Venous: 1.4 mmol/L (ref 0.5–1.9)

## 2020-09-14 LAB — PROCALCITONIN: Procalcitonin: 1.04 ng/mL

## 2020-09-14 LAB — BRAIN NATRIURETIC PEPTIDE: B Natriuretic Peptide: 1634.6 pg/mL — ABNORMAL HIGH (ref 0.0–100.0)

## 2020-09-14 MED ORDER — FUROSEMIDE 10 MG/ML IJ SOLN
40.0000 mg | Freq: Once | INTRAMUSCULAR | Status: AC
  Administered 2020-09-14: 40 mg via INTRAVENOUS
  Filled 2020-09-14: qty 4

## 2020-09-14 MED ORDER — VANCOMYCIN HCL IN DEXTROSE 1-5 GM/200ML-% IV SOLN: 1000.0000 mg | INTRAVENOUS | Status: DC

## 2020-09-14 MED ORDER — SODIUM CHLORIDE 0.9 % IV SOLN
2.0000 g | Freq: Two times a day (BID) | INTRAVENOUS | Status: DC
  Administered 2020-09-14 – 2020-09-18 (×8): 2 g via INTRAVENOUS
  Filled 2020-09-14 (×9): qty 2

## 2020-09-14 MED ORDER — VANCOMYCIN HCL 1500 MG/300ML IV SOLN
1500.0000 mg | Freq: Once | INTRAVENOUS | Status: DC
  Filled 2020-09-14: qty 300

## 2020-09-14 MED ORDER — INSULIN ASPART 100 UNIT/ML ~~LOC~~ SOLN
0.0000 [IU] | Freq: Three times a day (TID) | SUBCUTANEOUS | Status: DC
  Administered 2020-09-15 – 2020-09-16 (×2): 2 [IU] via SUBCUTANEOUS
  Administered 2020-09-16: 1 [IU] via SUBCUTANEOUS
  Administered 2020-09-17: 2 [IU] via SUBCUTANEOUS

## 2020-09-14 NOTE — ED Notes (Signed)
Per physician orders patient was removed from the non-rebreather mask and placed on the nasal cannula. Patient on 6L of O2 Nasal cannula and maintaining O2 level between 94 and 95. Physician notified.

## 2020-09-14 NOTE — ED Notes (Signed)
Date and time results received: 09/14/20   Test: troponin Critical Value: 57ng/l  Name of Provider Notified: Blake Divine MD

## 2020-09-14 NOTE — ED Notes (Signed)
Secretary to order dinner tray 

## 2020-09-14 NOTE — Plan of Care (Signed)
  Problem: Education: Goal: Knowledge of General Education information will improve Description: Including pain rating scale, medication(s)/side effects and non-pharmacologic comfort measures 09/14/2020 2316 by Aubery Lapping, RN Outcome: Progressing 09/14/2020 2315 by Aubery Lapping, RN Outcome: Progressing   Problem: Health Behavior/Discharge Planning: Goal: Ability to manage health-related needs will improve 09/14/2020 2316 by Aubery Lapping, RN Outcome: Progressing 09/14/2020 2315 by Aubery Lapping, RN Outcome: Progressing   Problem: Clinical Measurements: Goal: Ability to maintain clinical measurements within normal limits will improve 09/14/2020 2316 by Aubery Lapping, RN Outcome: Progressing 09/14/2020 2315 by Aubery Lapping, RN Outcome: Progressing Goal: Will remain free from infection 09/14/2020 2316 by Aubery Lapping, RN Outcome: Progressing 09/14/2020 2315 by Aubery Lapping, RN Outcome: Progressing Goal: Diagnostic test results will improve 09/14/2020 2316 by Aubery Lapping, RN Outcome: Progressing 09/14/2020 2315 by Aubery Lapping, RN Outcome: Progressing Goal: Respiratory complications will improve 09/14/2020 2316 by Aubery Lapping, RN Outcome: Progressing 09/14/2020 2315 by Aubery Lapping, RN Outcome: Progressing Goal: Cardiovascular complication will be avoided 09/14/2020 2316 by Aubery Lapping, RN Outcome: Progressing 09/14/2020 2315 by Aubery Lapping, RN Outcome: Progressing   Problem: Activity: Goal: Risk for activity intolerance will decrease 09/14/2020 2316 by Aubery Lapping, RN Outcome: Progressing 09/14/2020 2315 by Aubery Lapping, RN Outcome: Progressing   Problem: Nutrition: Goal: Adequate nutrition will be maintained 09/14/2020 2316 by Aubery Lapping, RN Outcome: Progressing 09/14/2020 2315 by Aubery Lapping, RN Outcome:  Progressing   Problem: Coping: Goal: Level of anxiety will decrease 09/14/2020 2316 by Aubery Lapping, RN Outcome: Progressing 09/14/2020 2315 by Aubery Lapping, RN Outcome: Progressing   Problem: Elimination: Goal: Will not experience complications related to bowel motility 09/14/2020 2316 by Aubery Lapping, RN Outcome: Progressing 09/14/2020 2315 by Aubery Lapping, RN Outcome: Progressing Goal: Will not experience complications related to urinary retention 09/14/2020 2316 by Aubery Lapping, RN Outcome: Progressing 09/14/2020 2315 by Aubery Lapping, RN Outcome: Progressing   Problem: Pain Managment: Goal: General experience of comfort will improve 09/14/2020 2316 by Aubery Lapping, RN Outcome: Progressing 09/14/2020 2315 by Aubery Lapping, RN Outcome: Progressing   Problem: Safety: Goal: Ability to remain free from injury will improve 09/14/2020 2316 by Aubery Lapping, RN Outcome: Progressing 09/14/2020 2315 by Aubery Lapping, RN Outcome: Progressing   Problem: Skin Integrity: Goal: Risk for impaired skin integrity will decrease 09/14/2020 2316 by Aubery Lapping, RN Outcome: Progressing 09/14/2020 2315 by Aubery Lapping, RN Outcome: Progressing

## 2020-09-14 NOTE — ED Provider Notes (Signed)
[facility] EMERGENCY DEPARTMENT Provider Note   CSN: [id number] Arrival date & time: 09/14/20  1205     History Chief Complaint  Patient presents with  . Shortness of Breath    Jeremiah [name] is a 74 y.o. male w/ hx of HTN, DM2, CKD stage 3, recent hospitalization (10/3-10/7/21) for acute on chronic respiratory failure with HCAP, weaned off oxygen, presenting back to ED from [facility] with hypoxia and SOB.  He was reportedly 75% on room air today (was on room air at baseline), requiring 6L [place] by EMS.  The patient reports he feels "fine" in the ED .  While in hospital earlier this month for resp failure and HCAP, blood cultures were negative, pt was treated with BS antibiotics initially, narrowed to Augmentin for 10 day total course.  He also received a bood transfusion for anemia.  CT chest showedacute airspace disease with numerous small to moderate left-sided pleural effusion that appears to be loculated. Has stable chronic right pleural collection with gas and debris secondary to recent drainage. Mild interstitial edema was noted.  He has required right-sided pleural effusion transfer via pigtail thoracentesis in the past.     HPI     Past Medical History:  Diagnosis Date  . Amputee   . Aortic atherosclerosis (HCC)   . Arthritis    "joints; shoulders, knees, hands, back" (05/21/2018)  . Atherosclerosis of coronary artery   . C. difficile diarrhea 04/2018  . Diastolic CHF (HCC)   . Diverticulosis   . High cholesterol   . History of gout   . Hypertension   . IDA (iron deficiency anemia)    from referral Dr Darleene Cleaver  . Internal hemorrhoids   . Peripheral vascular disease (HCC)   . Pneumonia    "couple times" (05/21/2018)  . Sleep apnea    "has mask; won't use" (05/21/2018)  . Type II diabetes mellitus [facility])     Patient Active Problem List   Diagnosis Date Noted  . CAP (community acquired pneumonia) 08/27/2020  . Pneumonia 08/27/2020  .  Acute respiratory failure with hypoxia (HCC)   . Chest tube in place   . Pneumonia due to COVID-19 virus 08/09/2020  . Acute metabolic encephalopathy 08/09/2020  . Dysphagia 08/09/2020  . Weakness   . Acute lower UTI 06/25/2020  . Abdominal pain   . Gangrene (HCC) 06/18/2020  . Post-operative pain 04/21/2019  . Abnormality of gait 03/24/2019  . Hypoglycemia   . Hyperkalemia   . Hyponatremia   . Diabetic peripheral neuropathy (HCC)   . Labile blood glucose   . Enteritis due to Clostridium difficile   . Blood glucose abnormal   . Diarrhea   . Benign essential HTN   . Hypoalbuminemia due to protein-calorie malnutrition (HCC)   . Labile blood pressure   . 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 Postoperative pain   . Acute blood loss anemia   . Dyslipidemia   . Type 2 diabetes mellitus with peripheral neuropathy (HCC)   . S/P AKA (above knee amputation) (HCC) 11/12/2018  . Unilateral AKA, right (HCC)   . Hypertensive crisis   . Diabetes mellitus type 2 in nonobese (HCC)   . Pleural effusion 11/08/2018  . Fever   . Open wound of right foot   . Sepsis (HCC) 11/03/2018  . Altered mental status   . Acute cystitis without hematuria   . Leukocytosis   . Pressure injury of skin 07/08/2018  . Clostridium difficile  colitis 07/07/2018  . Hypokalemia 07/07/2018  . HLD (hyperlipidemia) 07/07/2018  . Acute diverticulitis 06/04/2018  . Cholecystitis 05/24/2018  . Cholecystitis with cholelithiasis 05/21/2018  . Acute systolic heart failure (HCC) 03/27/2018  . Diabetes mellitus without complication (HCC) 01/30/2018  . Hypertension 01/30/2018  . Acute cholecystitis 01/30/2018  . AKI (acute kidney injury) (HCC) 01/30/2018  . Normocytic anemia 01/30/2018  . PVD (peripheral vascular disease) (HCC) 09/05/2014    Past Surgical History:  Procedure Laterality Date  . ABDOMINAL AORTOGRAM W/LOWER EXTREMITY N/A 11/01/2018   Procedure: ABDOMINAL AORTOGRAM W/LOWER EXTREMITY;  Surgeon:  Runell Gess, MD;  Location: [facility] INVASIVE CV LAB;  Service: Cardiovascular;  Laterality: N/A;  . ABDOMINAL AORTOGRAM W/LOWER EXTREMITY Left 06/20/2020   Procedure: ABDOMINAL AORTOGRAM W/LOWER EXTREMITY;  Surgeon: Cephus Shelling, MD;  Location: [facility] INVASIVE CV LAB;  Service: Cardiovascular;  Laterality: Left;  . AMPUTATION Right 11/09/2018   Procedure: RIGHT - AMPUTATION ABOVE KNEE;  Surgeon: Maeola Harman, MD;  Location: [facility] OR;  Service: Vascular;  Laterality: Right;  . CATARACT EXTRACTION W/ INTRAOCULAR LENS  IMPLANT, BILATERAL Bilateral   . CHOLECYSTECTOMY  05/21/2018   ATTEMPTED LAPAROSCOPIC CHOLECYSTECTOMY, OPEN DRAINAGE OF GALLBLADDER WITH BIOPSY  . CHOLECYSTECTOMY N/A 05/21/2018   Procedure: ATTEMPTED LAPAROSCOPIC CHOLECYSTECTOMY, OPEN DRAINAGE OF GALLBLADDER WITH BIOPSY;  Surgeon: Griselda Miner, MD;  Location: [facility] OR;  Service: General;  Laterality: N/A;  . COLONOSCOPY W/ POLYPECTOMY    . COLONOSCOPY WITH PROPOFOL N/A 09/16/2018   Procedure: COLONOSCOPY WITH PROPOFOL;  Surgeon: Charlott Rakes, MD;  Location: [facility] ENDOSCOPY;  Service: Endoscopy;  Laterality: N/A;  . 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 FLEXIBLE SIGMOIDOSCOPY N/A 04/20/2020   Procedure: FLEXIBLE SIGMOIDOSCOPY;  Surgeon: Meridee Score Netty Starring., MD;  Location: [facility] ENDOSCOPY;  Service: Gastroenterology;  Laterality: N/A;  Fecal Disimpaction  . IR CATHETER TUBE CHANGE  04/14/2018  . IR CHOLANGIOGRAM EXISTING TUBE  03/17/2018  . IR PERC CHOLECYSTOSTOMY  01/31/2018  . IR RADIOLOGIST EVAL & MGMT  03/02/2018  . PERIPHERAL VASCULAR INTERVENTION Left 06/20/2020   Procedure: PERIPHERAL VASCULAR INTERVENTION;  Surgeon: Cephus Shelling, MD;  Location: [facility] INVASIVE CV LAB;  Service: Cardiovascular;  Laterality: Left;  4 SFA STENTS       Family History  Problem Relation Age of Onset  .  Diabetes Mother   . Hypertension Mother   . Heart disease Father   . Heart attack Father   . Diabetes Sister   . Hypertension Sister   . Diabetes Brother   . Heart disease Brother   . Hypertension Brother   . Heart attack Brother     Social History   Tobacco Use  . Smoking status: Former Smoker    Years: 24.00    Types: Cigarettes    Quit date: 11/25/1983    Years since quitting: 36.8  . Smokeless tobacco: Never Used  Vaping Use  . Vaping Use: Never used  Substance Use Topics  . Alcohol use: Not Currently  . Drug use: No    Home Medications Prior to Admission medications   Medication Sig Start Date End Date Taking? Authorizing Provider  acetaminophen (TYLENOL) 500 MG tablet Take 1,000 mg by mouth in the morning, at noon, and at bedtime.    Yes [provider]  amLODipine (NORVASC) 10 MG tablet Take 10 mg by mouth daily. 07/26/20  Yes [provider]  Ascorbic  Acid (VITAMIN C PO) Take 2 tablets by mouth daily.   Yes [provider]  aspirin 81 MG tablet Take 81 mg by mouth daily.   Yes [provider]  atorvastatin (LIPITOR) 20 MG tablet Take 1 tablet (20 mg total) by mouth daily. Patient taking differently: Take 20 mg by mouth at bedtime.  12/06/18  Yes Angiulli, Mcarthur Rossetti, PA-C  brimonidine (ALPHAGAN) 0.2 % ophthalmic solution Place 1 drop into both eyes 2 (two) times daily. 12/06/18  Yes Angiulli, Mcarthur Rossetti, PA-C  clopidogrel (PLAVIX) 75 MG tablet Take 1 tablet (75 mg total) by mouth daily with breakfast. 06/22/20  Yes Rhetta Mura, MD  ferrous sulfate 325 (65 FE) MG tablet Take 1 tablet (325 mg total) by mouth 2 (two) times daily with a meal. 06/28/20  Yes Zannie Cove, MD  gabapentin (NEURONTIN) 300 MG capsule Take 1 capsule (300 mg total) by mouth 2 (two) times daily. 06/28/20  Yes Zannie Cove, MD  glimepiride (AMARYL) 1 MG tablet Take 1 tablet (1 mg total) by mouth daily with breakfast. 12/06/18  Yes Angiulli, Mcarthur Rossetti, PA-C    hydrALAZINE (APRESOLINE) 100 MG tablet Take 0.5 tablets (50 mg total) by mouth 2 (two) times daily. 08/30/20  Yes Osvaldo Shipper, MD  metoprolol succinate (TOPROL-XL) 25 MG 24 hr tablet Take 1 tablet (25 mg total) by mouth daily. 08/30/20 09/29/20 Yes Osvaldo Shipper, MD  pantoprazole (PROTONIX) 40 MG tablet Take 1 tablet (40 mg total) by mouth daily. 08/21/20 09/20/20 Yes Zigmund Daniel., MD  psyllium (METAMUCIL) 58.6 % powder Take 1 packet by mouth daily.   Yes [provider]  timolol (TIMOPTIC) 0.5 % ophthalmic solution Place 1 drop into both eyes 2 (two) times daily. 12/06/18  Yes Angiulli, Mcarthur Rossetti, PA-C  traMADol (ULTRAM) 50 MG tablet Take 50 mg by mouth 2 (two) times daily as needed for moderate pain.   Yes [provider]  vitamin B-12 1000 MCG tablet Take 1 tablet (1,000 mcg total) by mouth daily. 08/21/20 09/20/20 Yes Zigmund Daniel., MD  glucose blood test strip Accu-Chek Aviva Plus test strips  Take 1 strip 3 times a day by miscell. route for 90 days.    [provider]  glucose blood test strip Accu-Chek Aviva Plus test strips  USE AS DIRECTED THREE TIMES DAILY.    [provider]    Allergies    Patient has no known allergies.  Review of Systems   Review of Systems  Unable to perform ROS: Patient nonverbal    Physical Exam Updated Vital Signs BP (!) 105/58   Pulse 62   Temp 99.3 F (37.4 C) (Oral)   Resp 20   Ht  (1.753 m)   Wt 63 kg   SpO2 96%   BMI 20.53 kg/m   Physical Exam Vitals and nursing note reviewed.  Constitutional:      Appearance: He is well-developed.  HENT:     Head: Normocephalic and atraumatic.  Eyes:     Conjunctiva/sclera: Conjunctivae normal.  Cardiovascular:     Rate and Rhythm: Normal rate and regular rhythm.     Heart sounds: No murmur heard.   Pulmonary:     Effort: Pulmonary effort is normal.     Comments: Diminished breath sounds lower lung fields No crackles Abdominal:      Palpations: Abdomen is soft.     Tenderness: There is no abdominal tenderness.  Musculoskeletal:     Cervical back: Neck supple.  Skin:    General: Skin is warm and dry.  Neurological:     General: No focal deficit present.     Mental Status: He is alert and oriented to person, place, and time.     ED Results / Procedures / Treatments   Labs (all labs ordered are listed, but only abnormal results are displayed) Labs Reviewed  CBC WITH DIFFERENTIAL/PLATELET - Abnormal; Notable for the following components:      Result Value   WBC 11.5 (*)    RBC 2.74 (*)    Hemoglobin 7.4 (*)    HCT 24.5 (*)    Neutro Abs 8.7 (*)    Monocytes Absolute 1.1 (*)    All other components within normal limits  BASIC METABOLIC PANEL - Abnormal; Notable for the following components:   Glucose, Bld 235 (*)    BUN 48 (*)    Creatinine, Ser 1.73 (*)    Calcium 8.4 (*)    GFR, Estimated 40 (*)    All other components within normal limits  BRAIN NATRIURETIC PEPTIDE - Abnormal; Notable for the following components:   B Natriuretic Peptide 1,634.6 (*)    All other components within normal limits  I-STAT VENOUS BLOOD GAS, ED - Abnormal; Notable for the following components:   pH, Ven 7.532 (*)    pCO2, Ven 33.2 (*)    pO2, Ven 85.0 (*)    Acid-Base Excess 5.0 (*)    Calcium, Ion 1.06 (*)    HCT 22.0 (*)    Hemoglobin 7.5 (*)    All other components within normal limits  TROPONIN I (HIGH SENSITIVITY) - Abnormal; Notable for the following components:   Troponin I (High Sensitivity) 29 (*)    All other components within normal limits  TROPONIN I (HIGH SENSITIVITY) - Abnormal; Notable for the following components:   Troponin I (High Sensitivity) 57 (*)    All other components within normal limits  RESPIRATORY PANEL BY RT PCR (FLU A&B, COVID)  MRSA PCR SCREENING  CULTURE, BLOOD (ROUTINE X 2)  CULTURE, BLOOD (ROUTINE X 2)  URINALYSIS, ROUTINE W REFLEX MICROSCOPIC  SODIUM, URINE, RANDOM  CREATININE,  URINE, RANDOM  PROCALCITONIN  LACTIC ACID, PLASMA  CBC  BASIC METABOLIC PANEL    EKG EKG Interpretation  Date/Time:  Friday September 14 2020 12:15:37 EDT Ventricular Rate:  74 PR Interval:    QRS Duration: 85 QT Interval:  423 QTC Calculation: 470 R Axis:   57 Text Interpretation: Sinus rhythm Probable left atrial enlargement Borderline repolarization abnormality Borderline ST elevation, anterior leads No STEMI Confirmed by Alvester Chou [phone]) on 09/14/2020 12:35:42 PM   Radiology CT Chest Wo Contrast  Result Date: 09/14/2020 CLINICAL DATA:  Pleural effusion, worsening hypoxia, multifocal pneumonia EXAM: CT CHEST WITHOUT CONTRAST TECHNIQUE: Multidetector CT imaging of the chest was performed following the standard protocol without IV contrast. COMPARISON:  08/27/2020 FINDINGS: Cardiovascular: Extensive multi-vessel coronary artery calcification. Mild global cardiomegaly. Mild left ventricular dilation. Relative hypoattenuation of the cardiac blood pool is in keeping with at least moderate anemia. No pericardial effusion. Central pulmonary arteries are enlarged in keeping with pulmonary arterial hypertension. Moderate atherosclerotic calcification is seen within the thoracic aorta. No aortic aneurysm. Mediastinum/Nodes: Visualized thyroid unremarkable. Shotty mediastinal adenopathy is likely reactive in nature. Right hilar adenopathy is not well assessed due to confluent pulmonary parenchymal disease on this noncontrast examination. The esophagus is unremarkable. Lungs/Pleura: There is persistent subtotal collapse of the left lower lobe and total collapse of the lateral segment  of the right middle lobe and the entire right lower lobe. Stable consolidation within the superior segment of the a right lower lobe and progressive consolidation involving the dependent right upper lobe. Increasing bronchial wall thickening within the aerated right upper lobe in keeping with airway inflammation.  Stable trace interstitial pulmonary infiltrate most in keeping with superimposed mild interstitial pulmonary edema. Unchanged complex gas and fluid containing collection within the right pleural space most in keeping with a empyema measuring at least 5.2 x 13.0 x 10.7 cm in greatest dimension. There is associated pleural thickening, best noted at the lung base, in keeping with pleural inflammation. On the left, partially loculated small left pleural effusion is again seen with loculated fluid within the major fissure, unchanged. Mild pleural thickening is again noted, suggesting a complex effusion such as a parapneumonic effusion. Superinfection is difficult to exclude on this examination. Upper Abdomen: No acute abnormality. Musculoskeletal: No acute bone abnormality. IMPRESSION: Progressive consolidation within the right upper lobe. Stable collapse and consolidation of the right middle lobe and lower lobes bilaterally. Stable complex gas and fluid collection within the right pleural space with associated pleural thickening most in keeping with a a empyema. Stable complex partially loculated small left pleural effusion largely within the interlobular fissure. Stable superimposed mild interstitial pulmonary edema. Extensive coronary artery calcification. Morphologic changes of pulmonary arterial hypertension. Aortic Atherosclerosis (ICD10-I70.0). Electronically Signed   By: Helyn NumbersAshesh  Parikh MD   On: 09/14/2020 17:05   DG Chest Portable 1 View  Result Date: 09/14/2020 CLINICAL DATA:  Shortness of breath. EXAM: PORTABLE CHEST 1 VIEW COMPARISON:  08/26/2020 radiographs and 08/27/2020 CT chest. FINDINGS: Bilateral patchy airspace opacities, similar to prior. Similar appearance of a chronic right pleural fluid collection with internal gas. Suspect layering left pleural effusion. No discernible pneumothorax. Cardiac silhouette is enlarged. No acute osseous abnormality. Aortic atherosclerosis. IMPRESSION: 1. Similar  bilateral airspace opacities, compatible with multifocal pneumonia. 2. Suspected layering left pleural effusion and similar chronic right pleural effusion with internal gas. Electronically Signed   By: Feliberto HartsFrederick S Jones MD   On: 09/14/2020 13:30    Procedures Procedures (including critical care time)  Medications Ordered in ED Medications  vancomycin (VANCOREADY) IVPB 1500 mg/300 mL (has no administration in time range)  ceFEPIme (MAXIPIME) 2 g in sodium chloride 0.9 % 100 mL IVPB (has no administration in time range)  vancomycin (VANCOCIN) IVPB 1000 mg/200 mL premix (has no administration in time range)  insulin aspart (novoLOG) injection 0-6 Units (has no administration in time range)  furosemide (LASIX) injection 40 mg (40 mg Intravenous Given 09/14/20 1827)    ED Course  I have reviewed the triage vital signs and the nursing notes.  Pertinent labs & imaging results that were available during my care of the patient were reviewed by me and considered in my medical decision making (see chart for details).  This patient presents with concern for hypoxia and new oxygen requirement.  This involves an extensive number of treatment options, and is a complaint that carries with it a high risk of complications and morbidity.  The differential diagnosis includes PNA vs pleural effusion vs CHF exacerbation vs other  I ordered, reviewed, and interpreted labs, which included troponins (flat 60's -> 57), BNP (elevated 1200), BMP (AKI with Cr now 1.73, BUN 43), VBG (pH 7.53, pCO2 33), CBC (hgb near baseline at 7.5, WBC 11.5) I ordered imaging studies which included xray of the chest  I independently visualized and interpreted imaging which showed no significant  changes from prior xray Previous records obtained and reviewed showing recent October 2021 hospital course  After the interventions stated above, I reevaluated the patient and found he remained stable on 6L [place], no increased WOB.  I discussed  the case with the hospitalist, recommending hospital admission.  I ordered a repeat CT scan as it's unclear what the cause of his underlying hypoxia is.  Given the complexity of his medical issues, with likely multifactorial causes for hypoxia, I determined to wait for further testing, including CT, and allow the inpatient team to determine a need for diuresis and/or antibiotic treatment.  He did not have signs or symptoms of sepsis in the ED on arrival.  He had no tachycardia suggestive of PE.  CT chest was pending at the time of admission.   Clinical Course as of Sep 15 1847  Fri Sep 14, 2020  1432 95% on 6L [place], appears to be his new O2 requirement   [MT]  1551 Worsening hypoxia and elevated BNP today, will need re-admission to hospital for CHF evaluation.  Also has AKI   [MT]  1621 Covid positive 08/09/20   [MT]  1624 Signed out to hospitalist   [MT]  1624 With no fever and WBC 11.5, I suspect his xray findings of multifocal PNA are likely chronic from his recent infection.  I've ordered a CT chest to better delineate this, but I would not initiate IV antibiotics at this time, and I do not believe this is sepsis.     [MT]    Clinical Course User Index [MT] Dereon Williamsen, Kermit Balo, MD    Final Clinical Impression(s) / ED Diagnoses Final diagnoses:  AKI (acute kidney injury) [facility])    Rx / DC Orders ED Discharge Orders    None       Lonzo Saulter, Kermit Balo, MD 09/14/20 [phone]

## 2020-09-14 NOTE — ED Notes (Signed)
Attempted to call nursing report to 2w  

## 2020-09-14 NOTE — H&P (Signed)
History and Physical    Jeremiah [name] LOV:[id number] DOB: 17-Sep-1946 DOA: 09/14/2020  PCP: Annita Brod, MD  Patient coming from: SNF   I have personally briefly reviewed patient's old medical records in [facility]  Chief Complaint: brought in to ED for hypoxia.   HPI: Jeremiah [name] is a 74 y.o. male with medical history significant of peripheral vascular disease, s/p right AKI, chronic diastolic heart failure, hypertension type 2 diabetes mellitus, diagnosed with COVID-19 pneumonia 35 days ago, recently discharged from the hospital after being treated for pneumonia, recently treated aspiration pneumonia and right-sided pleural effusion with thoracentesis and discharged to SNF, presents today for hypoxia with sats in low 70's requiring up to 6 lit of [place] oxygen.  Pt appears to be confused, oriented to person and place only, he does not know why he is in the hospital. He denies any chest pain or sob, nausea, vomiting , abdominal pain, diarrhea, headache, dizziness, hematochezia, hematemesis. Most of the history is from the son over the phone, who reported that the patient's wife recently passed away, and were on their way to the funeral and was going to the patient to the funeral . On checking vital signs at the SNF, he was found to be hypoxic. Son reports that patient has been confused on and off lately.   ED Course: labs were significant for elevated creatinine of 1.73, elevated BNP of 1634, troponin of 29, hemoglobin of 7.4 and wbc count of 11.5. CXR shows Similar bilateral airspace opacities, compatible with multifocal pneumonia. Suspected layering left pleural effusion and similar chronic right pleural effusion with internal gas.  CT chest shows Progressive consolidation within the right upper lobe. Stable collapse and consolidation of the right middle lobe and lower lobes bilaterally.  Stable complex gas and fluid collection within the right pleural space with associated pleural  thickening most in keeping with an empyema.  He was referred to [facility] for acute respiratory failure with hypoxia.   Review of Systems: As per HPI , further ROS couldn't be obtained as pt is confused.   Past Medical History:  Diagnosis Date  . Amputee   . Aortic atherosclerosis (HCC)   . Arthritis    "joints; shoulders, knees, hands, back" (05/21/2018)  . Atherosclerosis of coronary artery   . C. difficile diarrhea 04/2018  . Diastolic CHF (HCC)   . Diverticulosis   . High cholesterol   . History of gout   . Hypertension   . IDA (iron deficiency anemia)    from referral Dr Darleene Cleaver  . Internal hemorrhoids   . Peripheral vascular disease (HCC)   . Pneumonia    "couple times" (05/21/2018)  . Sleep apnea    "has mask; won't use" (05/21/2018)  . Type II diabetes mellitus (HCC)     Past Surgical History:  Procedure Laterality Date  . ABDOMINAL AORTOGRAM W/LOWER EXTREMITY N/A 11/01/2018   Procedure: ABDOMINAL AORTOGRAM W/LOWER EXTREMITY;  Surgeon: Runell Gess, MD;  Location: [facility] INVASIVE CV LAB;  Service: Cardiovascular;  Laterality: N/A;  . ABDOMINAL AORTOGRAM W/LOWER EXTREMITY Left 06/20/2020   Procedure: ABDOMINAL AORTOGRAM W/LOWER EXTREMITY;  Surgeon: Cephus Shelling, MD;  Location: [facility] INVASIVE CV LAB;  Service: Cardiovascular;  Laterality: Left;  . AMPUTATION Right 11/09/2018   Procedure: RIGHT - AMPUTATION ABOVE KNEE;  Surgeon: Maeola Harman, MD;  Location: [facility] OR;  Service: Vascular;  Laterality: Right;  . CATARACT EXTRACTION W/ INTRAOCULAR LENS  IMPLANT, BILATERAL Bilateral   . CHOLECYSTECTOMY  05/21/2018  ATTEMPTED LAPAROSCOPIC CHOLECYSTECTOMY, OPEN DRAINAGE OF GALLBLADDER WITH BIOPSY  . CHOLECYSTECTOMY N/A 05/21/2018   Procedure: ATTEMPTED LAPAROSCOPIC CHOLECYSTECTOMY, OPEN DRAINAGE OF GALLBLADDER WITH BIOPSY;  Surgeon: Griselda Miner, MD;  Location: [facility] OR;  Service: General;  Laterality: N/A;  . COLONOSCOPY W/ POLYPECTOMY    . COLONOSCOPY WITH PROPOFOL N/A  09/16/2018   Procedure: COLONOSCOPY WITH PROPOFOL;  Surgeon: Charlott Rakes, MD;  Location: [facility] ENDOSCOPY;  Service: Endoscopy;  Laterality: N/A;  . 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 FLEXIBLE SIGMOIDOSCOPY N/A 04/20/2020   Procedure: FLEXIBLE SIGMOIDOSCOPY;  Surgeon: Meridee Score Netty Starring., MD;  Location: [facility];  Service: Gastroenterology;  Laterality: N/A;  Fecal Disimpaction  . IR CATHETER TUBE CHANGE  04/14/2018  . IR CHOLANGIOGRAM EXISTING TUBE  03/17/2018  . IR PERC CHOLECYSTOSTOMY  01/31/2018  . IR RADIOLOGIST EVAL & MGMT  03/02/2018  . PERIPHERAL VASCULAR INTERVENTION Left 06/20/2020   Procedure: PERIPHERAL VASCULAR INTERVENTION;  Surgeon: Cephus Shelling, MD;  Location: [facility] INVASIVE CV LAB;  Service: Cardiovascular;  Laterality: Left;  4 SFA STENTS    Social History  reports that he quit smoking about 36 years ago. His smoking use included cigarettes. He quit after 24.00 years of use. He has never used smokeless tobacco. He reports previous alcohol use. He reports that he does not use drugs.  No Known Allergies  Family History  Problem Relation Age of Onset  . Diabetes Mother   . Hypertension Mother   . Heart disease Father   . Heart attack Father   . Diabetes Sister   . Hypertension Sister   . Diabetes Brother   . Heart disease Brother   . Hypertension Brother   . Heart attack Brother    Family history reviewed and not pertinent.   Prior to Admission medications   Medication Sig Start Date End Date Taking? Authorizing Provider  acetaminophen (TYLENOL) 500 MG tablet Take 1,000 mg by mouth in the morning, at noon, and at bedtime.    Yes [provider]  amLODipine (NORVASC) 10 MG tablet Take 10 mg by mouth daily. 07/26/20  Yes [provider]  Ascorbic Acid (VITAMIN C PO) Take 2 tablets by mouth daily.   Yes [provider]  aspirin 81 MG tablet Take 81 mg by mouth daily.   Yes [provider]  atorvastatin (LIPITOR) 20 MG tablet Take 1 tablet (20 mg total) by mouth daily. Patient taking differently: Take 20 mg by mouth at bedtime.  12/06/18  Yes Angiulli, Mcarthur Rossetti, PA-C  brimonidine (ALPHAGAN) 0.2 % ophthalmic solution Place 1 drop into both eyes 2 (two) times daily. 12/06/18  Yes Angiulli, Mcarthur Rossetti, PA-C  clopidogrel (PLAVIX) 75 MG tablet Take 1 tablet (75 mg total) by mouth daily with breakfast. 06/22/20  Yes Rhetta Mura, MD  ferrous sulfate 325 (65 FE) MG tablet Take 1 tablet (325 mg total) by mouth 2 (two) times daily with a meal. 06/28/20  Yes Zannie Cove, MD  gabapentin (NEURONTIN) 300 MG capsule Take 1 capsule (300 mg total) by mouth 2 (two) times daily. 06/28/20  Yes Zannie Cove, MD  glimepiride (AMARYL) 1 MG tablet Take 1 tablet (1 mg total) by mouth daily with breakfast. 12/06/18  Yes Angiulli, Mcarthur Rossetti, PA-C  hydrALAZINE (APRESOLINE) 100 MG tablet Take 0.5 tablets (50 mg total) by mouth 2 (two) times daily. 08/30/20  Yes Osvaldo Shipper, MD  metoprolol succinate (TOPROL-XL) 25 MG 24 hr tablet Take 1 tablet (25 mg total) by mouth daily. 08/30/20 09/29/20 Yes Osvaldo Shipper, MD  pantoprazole (PROTONIX) 40 MG tablet Take 1 tablet (40 mg total) by mouth daily. 08/21/20 09/20/20 Yes Zigmund Daniel., MD  psyllium (METAMUCIL) 58.6 % powder Take 1 packet by mouth daily.   Yes [provider]  timolol (TIMOPTIC) 0.5 % ophthalmic solution Place 1 drop into both eyes 2 (two) times daily. 12/06/18  Yes Angiulli, Mcarthur Rossetti, PA-C  traMADol (ULTRAM) 50 MG tablet Take 50 mg by mouth 2 (two) times daily as needed for moderate pain.   Yes [provider]  vitamin B-12 1000 MCG tablet Take 1 tablet (1,000 mcg total) by mouth daily. 08/21/20 09/20/20 Yes Zigmund Daniel., MD  glucose blood test strip Accu-Chek Aviva Plus test strips  Take 1 strip 3 times a day by  miscell. route for 90 days.    [provider]  glucose blood test strip Accu-Chek Aviva Plus test strips  USE AS DIRECTED THREE TIMES DAILY.    [provider]    Physical Exam: Vitals:   09/14/20 1500 09/14/20 1600 09/14/20 1645 09/14/20 1715  BP: (!) 106/55 (!) 112/59 113/63 114/60  Pulse: 67 66 65 65  Resp: 19 17 (!) 22 (!) 21  Temp:      TempSrc:      SpO2: 95% 96% 98% 93%  Weight:      Height:        Constitutional: elderly gentleman not in distress but on 5 lit of [place] oxygen.  Vitals:   09/14/20 1500 09/14/20 1600 09/14/20 1645 09/14/20 1715  BP: (!) 106/55 (!) 112/59 113/63 114/60  Pulse: 67 66 65 65  Resp: 19 17 (!) 22 (!) 21  Temp:      TempSrc:      SpO2: 95% 96% 98% 93%  Weight:      Height:       Eyes: PERRL, lids and conjunctivae normal ENMT: Mucous membranes are moist  Neck: normal, supple, no masses, no thyromegaly Respiratory: diminished air entry on the right and at bases, no wheezing, tachypnea present, no rhonchi.  Cardiovascular: Regular rate and rhythm, S1S2 [place].  Abdomen: no tenderness, no masses palpated.  Bowel sounds positive.  Musculoskeletal: right AKI.  Skin: stage 2 sacral decubitus ulcer, and left heel ulcer.  Neurologic: CN 2-12 grossly intact. Psychiatric: confused, cannot be assessed.  Labs on Admission: I have personally reviewed following labs and imaging studies  CBC: Recent Labs  Lab 09/14/20 1445 09/14/20 1451  WBC 11.5*  --   NEUTROABS 8.7*  --   HGB 7.4* 7.5*  HCT 24.5* 22.0*  MCV 89.4  --   PLT 312  --     Basic Metabolic Panel: Recent Labs  Lab 09/14/20 1445 09/14/20 1451  NA 136 138  K 5.1 5.0  CL 102  --   CO2 24  --   GLUCOSE 235*  --   BUN 48*  --   CREATININE 1.73*  --   CALCIUM 8.4*  --     GFR: Estimated Creatinine Clearance: 32.4 mL/min (A) (by C-G formula based on SCr of 1.73 mg/dL (H)).  Liver Function Tests: No results for input(s): AST, ALT, ALKPHOS, BILITOT, PROT,  ALBUMIN in the last 168 hours.  Urine analysis:    Component Value Date/Time   COLORURINE YELLOW 08/09/2020 2226   APPEARANCEUR CLEAR 08/09/2020 2226   LABSPEC 1.011 08/09/2020  2226   PHURINE 7.0 08/09/2020 2226   GLUCOSEU NEGATIVE 08/09/2020 2226   HGBUR SMALL (A) 08/09/2020 2226   BILIRUBINUR NEGATIVE 08/09/2020 2226   KETONESUR NEGATIVE 08/09/2020 2226   PROTEINUR >=300 (A) 08/09/2020 2226   UROBILINOGEN 0.2 01/09/2009 1532   NITRITE NEGATIVE 08/09/2020 2226   LEUKOCYTESUR NEGATIVE 08/09/2020 2226    Radiological Exams on Admission: CT Chest Wo Contrast  Result Date: 09/14/2020 CLINICAL DATA:  Pleural effusion, worsening hypoxia, multifocal pneumonia EXAM: CT CHEST WITHOUT CONTRAST TECHNIQUE: Multidetector CT imaging of the chest was performed following the standard protocol without IV contrast. COMPARISON:  08/27/2020 FINDINGS: Cardiovascular: Extensive multi-vessel coronary artery calcification. Mild global cardiomegaly. Mild left ventricular dilation. Relative hypoattenuation of the cardiac blood pool is in keeping with at least moderate anemia. No pericardial effusion. Central pulmonary arteries are enlarged in keeping with pulmonary arterial hypertension. Moderate atherosclerotic calcification is seen within the thoracic aorta. No aortic aneurysm. Mediastinum/Nodes: Visualized thyroid unremarkable. Shotty mediastinal adenopathy is likely reactive in nature. Right hilar adenopathy is not well assessed due to confluent pulmonary parenchymal disease on this noncontrast examination. The esophagus is unremarkable. Lungs/Pleura: There is persistent subtotal collapse of the left lower lobe and total collapse of the lateral segment of the right middle lobe and the entire right lower lobe. Stable consolidation within the superior segment of the a right lower lobe and progressive consolidation involving the dependent right upper lobe. Increasing bronchial wall thickening within the aerated right  upper lobe in keeping with airway inflammation. Stable trace interstitial pulmonary infiltrate most in keeping with superimposed mild interstitial pulmonary edema. Unchanged complex gas and fluid containing collection within the right pleural space most in keeping with a empyema measuring at least 5.2 x 13.0 x 10.7 cm in greatest dimension. There is associated pleural thickening, best noted at the lung base, in keeping with pleural inflammation. On the left, partially loculated small left pleural effusion is again seen with loculated fluid within the major fissure, unchanged. Mild pleural thickening is again noted, suggesting a complex effusion such as a parapneumonic effusion. Superinfection is difficult to exclude on this examination. Upper Abdomen: No acute abnormality. Musculoskeletal: No acute bone abnormality. IMPRESSION: Progressive consolidation within the right upper lobe. Stable collapse and consolidation of the right middle lobe and lower lobes bilaterally. Stable complex gas and fluid collection within the right pleural space with associated pleural thickening most in keeping with a a empyema. Stable complex partially loculated small left pleural effusion largely within the interlobular fissure. Stable superimposed mild interstitial pulmonary edema. Extensive coronary artery calcification. Morphologic changes of pulmonary arterial hypertension. Aortic Atherosclerosis (ICD10-I70.0). Electronically Signed   By: Helyn NumbersAshesh  Parikh MD   On: 09/14/2020 17:05   DG Chest Portable 1 View  Result Date: 09/14/2020 CLINICAL DATA:  Shortness of breath. EXAM: PORTABLE CHEST 1 VIEW COMPARISON:  08/26/2020 radiographs and 08/27/2020 CT chest. FINDINGS: Bilateral patchy airspace opacities, similar to prior. Similar appearance of a chronic right pleural fluid collection with internal gas. Suspect layering left pleural effusion. No discernible pneumothorax. Cardiac silhouette is enlarged. No acute osseous abnormality.  Aortic atherosclerosis. IMPRESSION: 1. Similar bilateral airspace opacities, compatible with multifocal pneumonia. 2. Suspected layering left pleural effusion and similar chronic right pleural effusion with internal gas. Electronically Signed   By: Feliberto HartsFrederick S Jones MD   On: 09/14/2020 13:30    EKG: Independently reviewed. Sinus rhythm,   Assessment/Plan Active Problems:   Acute respiratory failure with hypoxia (HCC)   Acute respiratory failure with hypoxia secondary to extensive  consolidation on the right side/right-sided pneumonia/empyema on the right. Pt afebrile, mild leukocytosis of 11,500. Will get lactic acid and pro calcitonin level.  Started the patient empirically on IV vancomycin and cefepime. Nasal cannula oxygen to keep sats greater than 90%. Cardiothoracic surgery consult in the morning for evaluation of VATS versus pigtail catheter placement for empyema. Get MRSA PCR.  Will need to send pleural fluid for gram stain and culture,.      Mild acute on chronic diastolic heart failure:  Pulmonary edema superimposed on pulm congestion. BNP os 1634. One dose of IV lasix ordered.  Strict intake and output.  Resume BB, .    Elevated troponins from demand ischemia from mild acute diastolic heart failure.  Pt denies any chest pain.    AKI:  Get US renal, UA, urine electrolytes.  Repeat renal parameters in am.    Type 2 DM:  CBG (last 3)  No results for input(s): GLUCAP in the last 72 hours.  Last a1c is 6. Continue with SSI.     Pressure injury: Pressure Injury 08/27/20 Heel Left Deep Tissue Pressure Injury - Purple or maroon localized area of discolored intact skin or blood-filled blister due to damage of underlying soft tissue from pressure and/or shear. (Active)  08/27/20 1033  Location: Heel  Location Orientation: Left  Staging: Deep Tissue Pressure Injury - Purple or maroon localized area of discolored intact skin or blood-filled blister due to damage of  underlying soft tissue from pressure and/or shear.  Wound Description (Comments):   Present on Admission: Yes     Pressure Injury 08/27/20 Sacrum Mid Stage 2 -  Partial thickness loss of dermis presenting as a shallow open injury with a red, pink wound bed without slough. (Active)  08/27/20 1033  Location: Sacrum  Location Orientation: Mid  Staging: Stage 2 -  Partial thickness loss of dermis presenting as a shallow open injury with a red, pink wound bed without slough.  Wound Description (Comments):   Present on Admission: Yes   Wound care consulted .    Anemia of acute illness superimposed on anemia of chronic disease Baseline hemoglobin appears to be around 8. Currently around 7.4 consistent with the valve on discharge 2 weeks ago.  Anemia panel from last month, shows adequate iron levels and ferritin.  Transfuse to keep hemoglobin greater than 7.     DVT prophylaxis: Lovenox Code Status:   Full code Family Communication:  Discussed with son over the phone Disposition Plan:   Patient is from: SNF  Anticipated DC to:  SNF  Anticipated DC date: 09/17/2020  Anticipated DC barriers: Respiratory failure with hypoxia Consults called:  Cardiothoracic surgery in the morning Admission status:  Inpatient/telemetry  Severity of Illness: The appropriate patient status for this patient is INPATIENT. Inpatient status is judged to be reasonable and necessary in order to provide the required intensity of service to ensure the patient's safety. The patient's presenting symptoms, physical exam findings, and initial radiographic and laboratory data in the context of their chronic comorbidities is felt to place them at high risk for further clinical deterioration. Furthermore, it is not anticipated that the patient will be medically stable for discharge from the hospital within 2 midnights of admission.   Kathlen Mody MD [facility]  How to contact the [facility] Attending or Consulting  provider 7A - 7P or covering provider during after hours 7P -7A, for this patient?   1. Check the care team in [facility] and look for a) attending/consulting  [facility] provider listed and b) the [facility] team listed 2. Log into [email or web address] and use [place]'s universal password to access. If you do not have the password, please contact the hospital operator. 3. Locate the [facility] provider you are looking for under [facility] and page to a number that you can be directly reached. 4. If you still have difficulty reaching the provider, please page the [facility] (Director on Call) for the Hospitalists listed on amion for assistance.  09/14/2020, 5:24 PM

## 2020-09-14 NOTE — ED Notes (Signed)
Dinner Tray Ordered @ 1657. 

## 2020-09-14 NOTE — ED Notes (Signed)
Patient's son [name] called nurse. Update given.

## 2020-09-14 NOTE — ED Notes (Signed)
Pt transported to CT ?

## 2020-09-14 NOTE — ED Notes (Signed)
Pt transported to US

## 2020-09-14 NOTE — Progress Notes (Signed)
\  Pharmacy Antibiotic Note  Jeremiah [name] is a 74 y.o. male admitted on 09/14/2020 with pneumonia.  Pharmacy has been consulted for vancomycin and cefepime dosing. This is day 1 of therapy. Pt WBC 11.5, Tmax 99.68F with reported sx of shortness of breath. Pt had recent admission for COVID PNA (45 days ago).   Plan: Vancomycin 1500mg  IV x1 loading dose vancomcyin 1000mg  IV q24h Cefepime 2g IV q12h Monitor clinical progress, CBC, micro data, and renal function Monitor vanc trough at steady state F/u MRSA PCR to de-escalate abx  Height: 5\' 9"  (175.3 cm) Weight: 63 kg (139 lb) IBW/kg (Calculated) : 70.7  Temp (24hrs), Avg:99.3 F (37.4 C), Min:99.3 F (37.4 C), Max:99.3 F (37.4 C)  Recent Labs  Lab 09/14/20 1445  WBC 11.5*  CREATININE 1.73*    Estimated Creatinine Clearance: 32.4 mL/min (A) (by C-G formula based on SCr of 1.73 mg/dL (H)).    No Known Allergies  Antimicrobials this admission: Cefepime 10/22> vanc 10/22>   Microbiology results: 10/22 MRSA PCR ordered 10/22 resp panel ordered 10/22 UA ordered  Thank you for allowing pharmacy to be a part of this patients care.  11/22  PGY1 pharmacy resident 09/14/2020 5:27 PM

## 2020-09-14 NOTE — ED Triage Notes (Signed)
Patient presents from [facility] [place] per EMS with complaints of shortness of breath. When EMS arrived, patient was 79% on RA. EMS added 6L on [place] and patient was 88%. Patient was then placed on the non-rebreather and saturation rose to 98% on 10L. Per EMS patient is 45 days post COVID where he had pneumonia and pleural effusions with extration to those.   EMS VS BP 120/68 HR 80 RR 18 O2 98% on 10L NRB Temp 98.5 CBG 427

## 2020-09-15 ENCOUNTER — Inpatient Hospital Stay (HOSPITAL_COMMUNITY): Payer: Medicare Other

## 2020-09-15 DIAGNOSIS — Z8616 Personal history of COVID-19: Secondary | ICD-10-CM | POA: Diagnosis not present

## 2020-09-15 DIAGNOSIS — I5032 Chronic diastolic (congestive) heart failure: Secondary | ICD-10-CM

## 2020-09-15 DIAGNOSIS — J869 Pyothorax without fistula: Secondary | ICD-10-CM

## 2020-09-15 DIAGNOSIS — J9601 Acute respiratory failure with hypoxia: Secondary | ICD-10-CM | POA: Diagnosis not present

## 2020-09-15 DIAGNOSIS — I739 Peripheral vascular disease, unspecified: Secondary | ICD-10-CM | POA: Diagnosis not present

## 2020-09-15 LAB — GLUCOSE, CAPILLARY
Glucose-Capillary: 123 mg/dL — ABNORMAL HIGH (ref 70–99)
Glucose-Capillary: 148 mg/dL — ABNORMAL HIGH (ref 70–99)
Glucose-Capillary: 135 mg/dL — ABNORMAL HIGH (ref 70–99)
Glucose-Capillary: 203 mg/dL — ABNORMAL HIGH (ref 70–99)

## 2020-09-15 LAB — BASIC METABOLIC PANEL
Anion gap: 7 (ref 5–15)
BUN: 49 mg/dL — ABNORMAL HIGH (ref 8–23)
CO2: 28 mmol/L (ref 22–32)
Calcium: 8.4 mg/dL — ABNORMAL LOW (ref 8.9–10.3)
Chloride: 103 mmol/L (ref 98–111)
Creatinine, Ser: 1.75 mg/dL — ABNORMAL HIGH (ref 0.61–1.24)
GFR, Estimated: 40 mL/min — ABNORMAL LOW (ref >60)
Glucose, Bld: 122 mg/dL — ABNORMAL HIGH (ref 70–99)
Potassium: 4.8 mmol/L (ref 3.5–5.1)
Sodium: 138 mmol/L (ref 135–145)

## 2020-09-15 LAB — CBC
HCT: 23.3 % — ABNORMAL LOW (ref 39.0–52.0)
Hemoglobin: 7.2 g/dL — ABNORMAL LOW (ref 13.0–17.0)
MCH: 27.5 pg (ref 26.0–34.0)
MCHC: 30.9 g/dL (ref 30.0–36.0)
MCV: 88.9 fL (ref 80.0–100.0)
Platelets: 277 10*3/uL (ref 150–400)
RBC: 2.62 MIL/uL — ABNORMAL LOW (ref 4.22–5.81)
RDW: 15.8 % — ABNORMAL HIGH (ref 11.5–15.5)
WBC: 11.2 10*3/uL — ABNORMAL HIGH (ref 4.0–10.5)
nRBC: 0 % (ref 0.0–0.2)

## 2020-09-15 MED ORDER — GABAPENTIN 300 MG PO CAPS
300.0000 mg | ORAL_CAPSULE | Freq: Two times a day (BID) | ORAL | Status: DC
  Administered 2020-09-15 – 2020-09-18 (×5): 300 mg via ORAL
  Filled 2020-09-15 (×6): qty 1

## 2020-09-15 MED ORDER — ONDANSETRON HCL 4 MG/2ML IJ SOLN: 4.0000 mg | Freq: Four times a day (QID) | INTRAMUSCULAR | Status: DC | PRN

## 2020-09-15 MED ORDER — FERROUS SULFATE 325 (65 FE) MG PO TABS
325.0000 mg | ORAL_TABLET | Freq: Two times a day (BID) | ORAL | Status: DC
  Administered 2020-09-15 – 2020-09-16 (×3): 325 mg via ORAL
  Filled 2020-09-15 (×5): qty 1

## 2020-09-15 MED ORDER — VITAMIN B-12 1000 MCG PO TABS
1000.0000 ug | ORAL_TABLET | Freq: Every day | ORAL | Status: DC
  Administered 2020-09-15 – 2020-09-16 (×2): 1000 ug via ORAL
  Filled 2020-09-15 (×3): qty 1

## 2020-09-15 MED ORDER — SODIUM CHLORIDE 0.9% FLUSH: 3.0000 mL | INTRAVENOUS | Status: DC | PRN

## 2020-09-15 MED ORDER — TIMOLOL MALEATE 0.5 % OP SOLN
1.0000 [drp] | Freq: Two times a day (BID) | OPHTHALMIC | Status: DC
  Administered 2020-09-15 – 2020-09-25 (×21): 1 [drp] via OPHTHALMIC
  Filled 2020-09-15 (×2): qty 5

## 2020-09-15 MED ORDER — ENSURE ENLIVE PO LIQD
237.0000 mL | Freq: Three times a day (TID) | ORAL | Status: DC
  Administered 2020-09-15 – 2020-09-16 (×3): 237 mL via ORAL

## 2020-09-15 MED ORDER — VANCOMYCIN HCL IN DEXTROSE 1-5 GM/200ML-% IV SOLN: 1000.0000 mg | INTRAVENOUS | Status: DC

## 2020-09-15 MED ORDER — BRIMONIDINE TARTRATE 0.2 % OP SOLN
1.0000 [drp] | Freq: Two times a day (BID) | OPHTHALMIC | Status: DC
  Administered 2020-09-15 – 2020-09-25 (×21): 1 [drp] via OPHTHALMIC
  Filled 2020-09-15 (×2): qty 5

## 2020-09-15 MED ORDER — VANCOMYCIN HCL 1500 MG/300ML IV SOLN
1500.0000 mg | Freq: Once | INTRAVENOUS | Status: AC
  Administered 2020-09-15: 1500 mg via INTRAVENOUS
  Filled 2020-09-15: qty 300

## 2020-09-15 MED ORDER — ACETAMINOPHEN 500 MG PO TABS: 1000.0000 mg | ORAL_TABLET | Freq: Four times a day (QID) | ORAL | Status: DC | PRN

## 2020-09-15 MED ORDER — ASPIRIN EC 81 MG PO TBEC
81.0000 mg | DELAYED_RELEASE_TABLET | Freq: Every day | ORAL | Status: DC
  Administered 2020-09-15 – 2020-09-16 (×2): 81 mg via ORAL
  Filled 2020-09-15 (×3): qty 1

## 2020-09-15 MED ORDER — ZINC OXIDE 40 % EX OINT
TOPICAL_OINTMENT | Freq: Two times a day (BID) | CUTANEOUS | Status: AC
  Administered 2020-09-20 – 2020-09-22 (×3): 1 via TOPICAL
  Filled 2020-09-15 (×3): qty 57

## 2020-09-15 MED ORDER — METOPROLOL SUCCINATE ER 50 MG PO TB24
25.0000 mg | ORAL_TABLET | Freq: Every day | ORAL | Status: DC
  Administered 2020-09-15 – 2020-09-18 (×3): 25 mg via ORAL
  Filled 2020-09-15 (×4): qty 1

## 2020-09-15 MED ORDER — SODIUM CHLORIDE 0.9% FLUSH
3.0000 mL | Freq: Two times a day (BID) | INTRAVENOUS | Status: DC
  Administered 2020-09-15 – 2020-09-25 (×17): 3 mL via INTRAVENOUS

## 2020-09-15 MED ORDER — PANTOPRAZOLE SODIUM 40 MG PO TBEC
40.0000 mg | DELAYED_RELEASE_TABLET | Freq: Every day | ORAL | Status: DC
  Administered 2020-09-15 – 2020-09-16 (×2): 40 mg via ORAL
  Filled 2020-09-15 (×3): qty 1

## 2020-09-15 MED ORDER — JUVEN PO PACK
1.0000 | PACK | Freq: Every day | ORAL | Status: DC
  Administered 2020-09-15 – 2020-09-19 (×4): 1 via ORAL
  Filled 2020-09-15 (×3): qty 1

## 2020-09-15 MED ORDER — ATORVASTATIN CALCIUM 10 MG PO TABS
20.0000 mg | ORAL_TABLET | Freq: Every day | ORAL | Status: DC
  Administered 2020-09-15 – 2020-09-16 (×2): 20 mg via ORAL
  Filled 2020-09-15 (×2): qty 2

## 2020-09-15 MED ORDER — ENOXAPARIN SODIUM 40 MG/0.4ML ~~LOC~~ SOLN
40.0000 mg | SUBCUTANEOUS | Status: DC
  Administered 2020-09-15 – 2020-09-18 (×4): 40 mg via SUBCUTANEOUS
  Filled 2020-09-15 (×4): qty 0.4

## 2020-09-15 MED ORDER — SODIUM CHLORIDE 0.9 % IV SOLN
250.0000 mL | INTRAVENOUS | Status: DC | PRN
  Administered 2020-09-18 (×2): 250 mL via INTRAVENOUS

## 2020-09-15 NOTE — Progress Notes (Signed)
PROGRESS NOTE  GERAL COKER  DOB: Feb 25, 1946  PCP: Annita Brod, MD ERX:[id number]  DOA: 09/14/2020  LOS: 1 day   Chief Complaint  Patient presents with  . Shortness of Breath   Brief narrative: Jeremiah [name] is a 74 y.o. male with PMH of DM2, HTN, HLD, PAD s/p right AKA, chronic diastolic CHF, Covid pneumonia in September and recently hospitalized 10/3-10/7 for aspiration/healthcare associated pneumonia. During his recent admission, he was noted to have right-sided pleural effusion for which he underwent thoracentesis. He was subsequently discharged to SNF.  Patient was to the ED from SNF on 09/14/2020 for hypoxia to 70s requiring up to 6 L oxygen by nasal cannula.  In the ED, he was confused, oriented to place and person only. He was afebrile and required up to 10 L of oxygen initially. Blood gas showed alkalotic pH of 7.53 with PCO2 low at 33, blood glucose level elevated to 35, creatinine elevated 1.73, BNP elevated to 1600s, lactic acid normal, procalcitonin elevated to 1.04, WBC count elevated to 11.5, hemoglobin low at 7.5  CT scan of chest was obtained which showed  -progressive consolidation within the right upper lobe.  -Stable collapse and consolidation of the right middle lobe and lower lobes bilaterally. -Stable complex gas and fluid collection within the right pleural space with associated pleural thickening most in keeping with an empyema. -Stable complex partially loculated small left pleural effusion largely within the interlobular fissure.Marland Kitchen He was admitted to hospital service for further evaluation management. CT surgery consultation was called.  Subjective: Patient was seen and examined this morning.  Pleasant elderly Caucasian male.  Not in distress.  On 6 L oxygen by nasal cannula.   Has a pressure ulcer on the left heel.  I took the bandage and took a picture.  Seems to have bone involvement as well.  Assessment/Plan: Progressive right upper lobe  pneumonia -Presented with worsening shortness of breath, hypoxia -CT scan finding as above include progressive consolidation within the right upper lobe -WBC count, procalcitonin level elevated, lactate level normal. -Currently on empiric IV vancomycin and IV cefepime. Recent Labs  Lab 09/14/20 1445 09/14/20 2029 09/15/20 0200  WBC 11.5*  --  11.2*  LATICACIDVEN  --  1.4  --   PROCALCITON  --  1.04  --    Right-sided empyema -Per CT chest, patient has stable complex gas and fluid collection within the right pleural space with associated pleural thickening most in keeping with an empyema. -Admitting physician called thoracic surgery consultation with Dr. Lavinia Sharps.  Acute on chronic respiratory failure with hypoxia -Chronically on 4 L oxygen by nasal cannula. Required 10 L initially. Currently on 6 L. Wean down as tolerated.  Mild acute exacerbation of chronic diastolic CHF Moderate aortic valve stenosis Essential hypertension -Home meds include Toprol 25 mg daily, amlodipine 10 mg daily, hydralazine 50 mg twice daily -Echo from July 2021 showed EF of 70 to 75%, moderate LVH, grade 1 diastolic dysfunction, moderate aortic valve stenosis. -Given 1 dose of Lasix 40 mg daily on admission. Currently does not look fluid overloaded.  I would not repeat Lasix.   -Blood pressure is controlled on metoprolol. Amlodipine and hydralazine are on hold. -Continue to monitor blood pressure. Hydralazine IV as needed.  PAD s/p right AKA Hyperlipidemia Elevated troponin -Home meds include aspirin 81 mg daily, Plavix 75 mg daily, Lipitor 20 mg at bedtime, -Continue aspirin and statin. Plavix on hold for possible need of surgery. -Troponin elevation due to demand ischemia  most likely  Diabetes mellitus type 2 -A1c 6 on 9/17 -On glimepiride 1 mg daily at home. -Kept on hold. Continue sliding scale insulin with Accu-Cheks. -Continue Neurontin 300 mg twice daily for neuropathy. Recent Labs  Lab  09/15/20 0738 09/15/20 0946 09/15/20 1212  GLUCAP 123* 148* 135*   AKI on CKD 2 -Baseline creatinine 1.16 two weeks ago. -Creatinine was 1.73 on admission. Continue to monitor. Recent Labs    08/18/20 0409 08/19/20 0842 08/20/20 1656 08/21/20 0507 08/26/20 2233 08/27/20 0307 08/28/20 0258 08/30/20 0141 09/14/20 1445 09/15/20 0200  BUN 21 21 20 15  30* 32* 29* 34* 48* 49*  CREATININE 1.28* 1.34* 1.25* 1.23 1.70* 1.67* 1.46* 1.16 1.73* 1.75*   Chronic anemia -Hemoglobin low but stable. -Continue Protonix 40 mg daily, iron supplement and vitamin B12 supplement. Recent Labs    08/19/20 0842 08/20/20 1656 08/21/20 0507 08/26/20 2233 08/27/20 0307 08/28/20 0258 08/30/20 0141 09/14/20 1445 09/14/20 1451 09/15/20 0200  HGB 7.0* 7.8* 7.7* 7.0* 6.6* 7.8* 7.7* 7.4* 7.5* 7.2*   Left heel wound -Picture taken and attached.  Suspect bone involvement. -Obtain an x-ray to rule out osteomyelitis.   -IV vancomycin on.  Wound care consult appreciated.    Mobility: PT eval Code Status:   Code Status: Full Code full code Nutritional status: Body mass index is 20.53 kg/m. Nutrition Problem: Increased nutrient needs Etiology: acute illness (acute respiratory failure with hypoxia secondary to pneumonia/empyema) Signs/Symptoms: estimated needs Diet Order            Diet heart healthy/carb modified Room service appropriate? Yes; Fluid consistency: Thin  Diet effective now                 DVT prophylaxis: enoxaparin (LOVENOX) injection 40 mg Start: 09/15/20 1000   Antimicrobials:  IV cefepime, IV vancomycin Fluid: None Consultants: CT surgery Family Communication:  No family at bedside.  Status is: Inpatient  Remains inpatient appropriate because:Ongoing diagnostic testing needed not appropriate for outpatient work up and IV treatments appropriate due to intensity of illness or inability to take PO   Dispo: The patient is from: SNF              Anticipated d/c is to:  SNF              Anticipated d/c date is: > 3 days              Patient currently is not medically stable to d/c.  Infusions:  . sodium chloride    . ceFEPime (MAXIPIME) IV 2 g (09/15/20 1011)  . vancomycin    . vancomycin      Scheduled Meds: . aspirin EC  81 mg Oral Daily  . atorvastatin  20 mg Oral q1800  . brimonidine  1 drop Both Eyes BID  . enoxaparin (LOVENOX) injection  40 mg Subcutaneous Q24H  . feeding supplement  237 mL Oral TID BM  . ferrous sulfate  325 mg Oral BID WC  . gabapentin  300 mg Oral BID  . insulin aspart  0-6 Units Subcutaneous TID WC  . liver oil-zinc oxide   Topical BID  . metoprolol succinate  25 mg Oral Daily  . nutrition supplement (JUVEN)  1 packet Oral Q0200  . pantoprazole  40 mg Oral Daily  . sodium chloride flush  3 mL Intravenous Q12H  . timolol  1 drop Both Eyes BID  . cyanocobalamin  1,000 mcg Oral Daily    Antimicrobials: Anti-infectives (From admission, onward)  Start     Dose/Rate Route Frequency Ordered Stop   09/15/20 1800  vancomycin (VANCOCIN) IVPB 1000 mg/200 mL premix        1,000 mg 200 mL/hr over 60 Minutes Intravenous Every 24 hours 09/14/20 1726     09/14/20 2200  ceFEPIme (MAXIPIME) 2 g in sodium chloride 0.9 % 100 mL IVPB        2 g 200 mL/hr over 30 Minutes Intravenous Every 12 hours 09/14/20 1726     09/14/20 1730  vancomycin (VANCOREADY) IVPB 1500 mg/300 mL        1,500 mg 150 mL/hr over 120 Minutes Intravenous  Once 09/14/20 1726        PRN meds:    Objective: Vitals:   09/15/20 0551 09/15/20 0900  BP: 115/61   Pulse: 64   Resp: 18   Temp: 97.8 F (36.6 C)   SpO2: 100% 100%    Intake/Output Summary (Last 24 hours) at 09/15/2020 1334 Last data filed at 09/15/2020 0550 Gross per 24 hour  Intake 240 ml  Output 300 ml  Net -60 ml   Filed Weights   09/14/20 1222  Weight: 63 kg   Weight change:  Body mass index is 20.53 kg/m.   Physical Exam: General exam: Appears calm and comfortable.  Not  in physical distress Skin: No rashes, lesions or ulcers. HEENT: Atraumatic, normocephalic, supple neck, no obvious bleeding Lungs: Diminished air entry on the right CVS: Regular rate and rhythm, no murmur GI/Abd soft, nontender, nondistended, bowel sound present CNS: Alert, awake, oriented x3 Psychiatry: Mood appropriate Extremities: Right AKA status.  Left foot with heel wound as above.  Data Review: I have personally reviewed the laboratory data and studies available.  Recent Labs  Lab 09/14/20 1445 09/14/20 1451 09/15/20 0200  WBC 11.5*  --  11.2*  NEUTROABS 8.7*  --   --   HGB 7.4* 7.5* 7.2*  HCT 24.5* 22.0* 23.3*  MCV 89.4  --  88.9  PLT 312  --  277   Recent Labs  Lab 09/14/20 1445 09/14/20 1451 09/15/20 0200  NA 136 138 138  K 5.1 5.0 4.8  CL 102  --  103  CO2 24  --  28  GLUCOSE 235*  --  122*  BUN 48*  --  49*  CREATININE 1.73*  --  1.75*  CALCIUM 8.4*  --  8.4*    F/u labs ordered  Signed, Lorin Glass, MD [facility] 09/15/2020

## 2020-09-15 NOTE — Evaluation (Signed)
Clinical/Bedside Swallow Evaluation Patient Details  Name: Jeremiah [name] MRN: [id number] Date of Birth: 1946-03-27  Today's Date: 09/15/2020 Time: SLP Start Time (ACUTE ONLY): 0850 SLP Stop Time (ACUTE ONLY): 0905 SLP Time Calculation (min) (ACUTE ONLY): 15 min  Past Medical History:  Past Medical History:  Diagnosis Date  . Amputee   . Aortic atherosclerosis (HCC)   . Arthritis    "joints; shoulders, knees, hands, back" (05/21/2018)  . Atherosclerosis of coronary artery   . C. difficile diarrhea 04/2018  . Diastolic CHF (HCC)   . Diverticulosis   . High cholesterol   . History of gout   . Hypertension   . IDA (iron deficiency anemia)    from referral Dr Darleene Cleaver  . Internal hemorrhoids   . Peripheral vascular disease (HCC)   . Pneumonia    "couple times" (05/21/2018)  . Sleep apnea    "has mask; won't use" (05/21/2018)  . Type II diabetes mellitus (HCC)    Past Surgical History:  Past Surgical History:  Procedure Laterality Date  . ABDOMINAL AORTOGRAM W/LOWER EXTREMITY N/A 11/01/2018   Procedure: ABDOMINAL AORTOGRAM W/LOWER EXTREMITY;  Surgeon: Runell Gess, MD;  Location: [facility] INVASIVE CV LAB;  Service: Cardiovascular;  Laterality: N/A;  . ABDOMINAL AORTOGRAM W/LOWER EXTREMITY Left 06/20/2020   Procedure: ABDOMINAL AORTOGRAM W/LOWER EXTREMITY;  Surgeon: Cephus Shelling, MD;  Location: [facility] INVASIVE CV LAB;  Service: Cardiovascular;  Laterality: Left;  . AMPUTATION Right 11/09/2018   Procedure: RIGHT - AMPUTATION ABOVE KNEE;  Surgeon: Maeola Harman, MD;  [facility];  Service: Vascular;  Laterality: Right;  . CATARACT EXTRACTION W/ INTRAOCULAR LENS  IMPLANT, BILATERAL Bilateral   . CHOLECYSTECTOMY  05/21/2018   ATTEMPTED LAPAROSCOPIC CHOLECYSTECTOMY, OPEN DRAINAGE OF GALLBLADDER WITH BIOPSY  . CHOLECYSTECTOMY N/A 05/21/2018   Procedure: ATTEMPTED LAPAROSCOPIC CHOLECYSTECTOMY, OPEN DRAINAGE OF GALLBLADDER WITH BIOPSY;  Surgeon: Griselda Miner, MD;   Location: [facility] OR;  Service: General;  Laterality: N/A;  . COLONOSCOPY W/ POLYPECTOMY    . COLONOSCOPY WITH PROPOFOL N/A 09/16/2018   Procedure: COLONOSCOPY WITH PROPOFOL;  Surgeon: Charlott Rakes, MD;  Location: [facility] ENDOSCOPY;  Service: Endoscopy;  Laterality: N/A;  . 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 FLEXIBLE SIGMOIDOSCOPY N/A 04/20/2020   Procedure: FLEXIBLE SIGMOIDOSCOPY;  Surgeon: Meridee Score Netty Starring., MD;  Location: [facility];  Service: Gastroenterology;  Laterality: N/A;  Fecal Disimpaction  . IR CATHETER TUBE CHANGE  04/14/2018  . IR CHOLANGIOGRAM EXISTING TUBE  03/17/2018  . IR PERC CHOLECYSTOSTOMY  01/31/2018  . IR RADIOLOGIST EVAL & MGMT  03/02/2018  . PERIPHERAL VASCULAR INTERVENTION Left 06/20/2020   Procedure: PERIPHERAL VASCULAR INTERVENTION;  Surgeon: Cephus Shelling, MD;  Location: [facility] INVASIVE CV LAB;  Service: Cardiovascular;  Laterality: Left;  4 SFA STENTS   HPI:  Patient is a 74 y.o. male with PMH: PVD s/p right AKI, chronic diastolic heart failure, HTN, DM-2, diagnosed with covid-19 35 days agao and recently discharged from hospital to SNF after being treated for PNA and right sided pleural effusion with thoracentesis. He presented to hospital with hypoxia with SpO2 in low 70's requiring up to 6 liters oxygen via [place]. Patient with some confusion and son provided most of history during phone call. Patient's wife recently passed away and family was planning on taking patient from SNF to funeral when he was found to be hypoxic. CXR revealed bilateral airspace  opacities compatible with multifocal PNA; suspected layering o fleft pleural effusion and similar chronic right pleural effusion with internal gas.   Assessment / Plan / Recommendation Clinical Impression  Patient presents with an oropharyngeal swallow that is WFL-WNL and without any overt s/s  of aspiration or penetration. Of note, patient has had BSE during recent hospital stay in September of 2021 and swallow was assessed by SLP at that time and found to be WNL. Patient with strong cough, good swallow initiation, voice remained clear throughout. No change in vital signs observed and no overt indications of patient distress during PO intake. SLP Visit Diagnosis: Dysphagia, unspecified (R13.10)    Aspiration Risk  No limitations    Diet Recommendation Regular;Thin liquid   Liquid Administration via: Straw;Cup Medication Administration: Whole meds with liquid Supervision: Patient able to self feed Postural Changes: Seated upright at 90 degrees    Other  Recommendations Oral Care Recommendations: Oral care BID   Follow up Recommendations None      Frequency and Duration            Prognosis        Swallow Study   General Date of Onset: 09/15/20 HPI: Patient is a 74 y.o. male with PMH: PVD s/p right AKI, chronic diastolic heart failure, HTN, DM-2, diagnosed with covid-19 35 days agao and recently discharged from hospital to SNF after being treated for PNA and right sided pleural effusion with thoracentesis. He presented to hospital with hypoxia with SpO2 in low 70's requiring up to 6 liters oxygen via [place]. Patient with some confusion and son provided most of history during phone call. Patient's wife recently passed away and family was planning on taking patient from SNF to funeral when he was found to be hypoxic. CXR revealed bilateral airspace opacities compatible with multifocal PNA; suspected layering o fleft pleural effusion and similar chronic right pleural effusion with internal gas. Type of Study: Bedside Swallow Evaluation Previous Swallow Assessment: BSE during last month's hospital stay (WNL swallow) Diet Prior to this Study: NPO Temperature Spikes Noted: No Respiratory Status: Nasal cannula History of Recent Intubation: No Behavior/Cognition:  Alert;Cooperative;Pleasant mood Oral Cavity Assessment: Within Functional Limits Oral Care Completed by SLP: Recent completion by staff Oral Cavity - Dentition: Adequate natural dentition Vision: Functional for self-feeding Self-Feeding Abilities: Able to feed self Patient Positioning: Upright in bed Baseline Vocal Quality: Normal Volitional Cough: Strong Volitional Swallow: Able to elicit    Oral/Motor/Sensory Function Overall Oral Motor/Sensory Function: Within functional limits   Ice Chips     Thin Liquid Thin Liquid: Within functional limits Presentation: Straw;Self Fed    Nectar Thick     Honey Thick     Puree     Solid     Solid: Within functional limits Presentation: Self Fed      Angela Nevin, MA, CCC-SLP Speech Therapy

## 2020-09-15 NOTE — Consult Note (Addendum)
WOC Nurse Consult Note: Reason for Consult: Unstageable pressure injury to the left heel, Stage 2 pressure injury in combination with moisture associated skin damage (MASD, incontinence associated skin damage (IAD).  Seen by the [facility] department on 2 recent occasions, by this writer on 9/26 and by my associate on 08/27/20. Wound type: Pressure  Pressure Injury POA: Yes Measurements:  Left heel Unstageable pressure injury: See photo in EMR taken yesterday. Measurements per Nursing Flow Sheet (Discussed with bedside RN, Nadine via Secure Chat today and she is to measure and document in Nursing Flow Sheet) Sacral Stage 2 PI in area of MASD: See Nursing Flow Sheet for measurement of area Wound bed: Pink, dry in sacral area, small serous exudate. 40% red, 60% black, nonviable tissue with small serous to light yellow exudate from left heel  Drainage (amount, consistency, odor) As noted above Periwound: Dry, intact Dressing procedure/placement/frequency: I will provide the pressure redistribution boots required to offload the left heel and the POC aimed at drying the necrotic tissue-twice daily application of a betadine swabstick, covering with dry gauze, securing with Kerlix roll gauze/paper tape and placing into [employer]. I will continue the previus interventions for the sacrum of a mattress replacement with low air loss feature, turning and repositioning, and application of our house moisture barrier ointment twice daily and PRN soiling.  Patient had an appointment [facility] and Dr. Laroy Apple scheduled for 09/18/20. He should reschedule this appointment post discharge..  WOC nursing team will not follow, but will remain available to this patient, the nursing and medical teams.  Please re-consult if needed. Thanks, Ladona Mow, MSN, RN, GNP, [place]  Pager# [phone]

## 2020-09-15 NOTE — Consult Note (Signed)
[address]       [place] [address]             [phone]      Cardiothoracic Surgery Consultation  Reason for Consult: Right empyema Referring Physician: Dr. Dorris Carnes is an 74 y.o. male.  HPI:   The patient is a 74 year old gentleman with a history of hypercholesterolemia, hypertension, type 2 diabetes, chronic diastolic heart failure peripheral vascular disease status post right above-knee amputation in 2019 and COVID-19 pneumonia about 5 weeks ago he developed a large right-sided pleural effusion at that time.  He was seen by Dr. Cliffton Asters from our service and the patient was not felt to be an operative candidate and percutaneous drainage is recommended.  He had a right pigtail catheter placed which was eventually removed and he was discharged to SNF.  He presented yesterday with hypoxemia with sats in the low 70s with confusion.  On admission he was noted to have an elevated creatinine of 1.73, and elevated BNP of 1634, troponin of 29, white blood cell count 11.5, and hemoglobin of 7.4.  Chest x-ray showed bilateral airspace opacities consistent with multifocal pneumonia similar to his previous x-ray.  There are bilateral pleural effusions.  CT scan of the chest shows progressive consolidation of the right upper lobe with stable collapse and consolidation of right middle and lower lobes.  There is an unchanged complex gas and fluid collection in the right pleural space consistent with empyema with associated pleural thickening.  There is subtotal collapse of left lower lobe with a moderate left pleural effusion.  At this time patient says that he has no shortness of breath on 6 L nasal cannula.  He wants to know why he has to wear the oxygen.  He denies cough or sputum production.  Denies chest or back pain.  He said that his wife recently died and her funeral was today.  She had been on dialysis for 30 years.   Past Medical History:  Diagnosis Date   . Amputee   . Aortic atherosclerosis (HCC)   . Arthritis    "joints; shoulders, knees, hands, back" (05/21/2018)  . Atherosclerosis of coronary artery   . C. difficile diarrhea 04/2018  . Diastolic CHF (HCC)   . Diverticulosis   . High cholesterol   . History of gout   . Hypertension   . IDA (iron deficiency anemia)    from referral Dr Darleene Cleaver  . Internal hemorrhoids   . Peripheral vascular disease (HCC)   . Pneumonia    "couple times" (05/21/2018)  . Sleep apnea    "has mask; won't use" (05/21/2018)  . Type II diabetes mellitus (HCC)     Past Surgical History:  Procedure Laterality Date  . ABDOMINAL AORTOGRAM W/LOWER EXTREMITY N/A 11/01/2018   Procedure: ABDOMINAL AORTOGRAM W/LOWER EXTREMITY;  Surgeon: Runell Gess, MD;  Location: [facility];  Service: Cardiovascular;  Laterality: N/A;  . ABDOMINAL AORTOGRAM W/LOWER EXTREMITY Left 06/20/2020   Procedure: ABDOMINAL AORTOGRAM W/LOWER EXTREMITY;  Surgeon: Cephus Shelling, MD;  Location: [facility];  Service: Cardiovascular;  Laterality: Left;  . AMPUTATION Right 11/09/2018   Procedure: RIGHT - AMPUTATION ABOVE KNEE;  Surgeon: Maeola Harman, MD;  Location: [facility] OR;  Service: Vascular;  Laterality: Right;  . CATARACT EXTRACTION W/ INTRAOCULAR LENS  IMPLANT, BILATERAL Bilateral   . CHOLECYSTECTOMY  05/21/2018   ATTEMPTED LAPAROSCOPIC CHOLECYSTECTOMY, OPEN DRAINAGE OF GALLBLADDER WITH BIOPSY  .  CHOLECYSTECTOMY N/A 05/21/2018   Procedure: ATTEMPTED LAPAROSCOPIC CHOLECYSTECTOMY, OPEN DRAINAGE OF GALLBLADDER WITH BIOPSY;  Surgeon: Griselda Miner, MD;  Location: [facility] OR;  Service: General;  Laterality: N/A;  . COLONOSCOPY W/ POLYPECTOMY    . COLONOSCOPY WITH PROPOFOL N/A 09/16/2018   Procedure: COLONOSCOPY WITH PROPOFOL;  Surgeon: Charlott Rakes, MD;  Location: [facility];  Service: Endoscopy;  Laterality: N/A;  . FECAL TRANSPLANT N/A 09/16/2018   Procedure: FECAL TRANSPLANT;  Surgeon: Charlott Rakes,  MD;  Location: [facility]OS[facility];  Service: Endoscopy;  Laterality: N/A;  . FLEXIBLE SIGMOIDOSCOPY N/A 04/20/2020   Procedure: FLEXIBLE SIGMOIDOSCOPY;  Surgeon: Meridee Score Netty Starring., MD;  Location: [facility];  Service: Gastroenterology;  Laterality: N/A;  Fecal Disimpaction  . IR CATHETER TUBE CHANGE  04/14/2018  . IR CHOLANGIOGRAM EXISTING TUBE  03/17/2018  . IR PERC CHOLECYSTOSTOMY  01/31/2018  . IR RADIOLOGIST EVAL & MGMT  03/02/2018  . PERIPHERAL VASCULAR INTERVENTION Left 06/20/2020   Procedure: PERIPHERAL VASCULAR INTERVENTION;  Surgeon: Cephus Shelling, MD;  Location: [facility] INVASIVE CV LAB;  Service: Cardiovascular;  Laterality: Left;  4 SFA STENTS    Family History  Problem Relation Age of Onset  . Diabetes Mother   . Hypertension Mother   . Heart disease Father   . Heart attack Father   . Diabetes Sister   . Hypertension Sister   . Diabetes Brother   . Heart disease Brother   . Hypertension Brother   . Heart attack Brother     Social History:  reports that he quit smoking about 36 years ago. His smoking use included cigarettes. He quit after 24.00 years of use. He has never used smokeless tobacco. He reports previous alcohol use. He reports that he does not use drugs.  Allergies: No Known Allergies  Medications:  I have reviewed the patient's current medications. Prior to Admission:  Medications Prior to Admission  Medication Sig Dispense Refill Last Dose  . acetaminophen (TYLENOL) 500 MG tablet Take 1,000 mg by mouth in the morning, at noon, and at bedtime.    09/14/2020 at Unknown time  . amLODipine (NORVASC) 10 MG tablet Take 10 mg by mouth daily.   09/14/2020 at Unknown time  . Ascorbic Acid (VITAMIN C PO) Take 2 tablets by mouth daily.   09/14/2020 at Unknown time  . aspirin 81 MG tablet Take 81 mg by mouth daily.   09/14/2020 at Unknown time  . atorvastatin (LIPITOR) 20 MG tablet Take 1 tablet (20 mg total) by mouth daily. (Patient taking differently: Take 20 mg by  mouth at bedtime. ) 30 tablet 1 09/13/2020 at Unknown time  . brimonidine (ALPHAGAN) 0.2 % ophthalmic solution Place 1 drop into both eyes 2 (two) times daily. 5 mL 12 09/14/2020 at Unknown time  . clopidogrel (PLAVIX) 75 MG tablet Take 1 tablet (75 mg total) by mouth daily with breakfast. 30 tablet 12 09/14/2020 at Unknown time  . ferrous sulfate 325 (65 FE) MG tablet Take 1 tablet (325 mg total) by mouth 2 (two) times daily with a meal. 60 tablet 1 09/14/2020 at Unknown time  . gabapentin (NEURONTIN) 300 MG capsule Take 1 capsule (300 mg total) by mouth 2 (two) times daily. 60 capsule 0 09/14/2020 at Unknown time  . glimepiride (AMARYL) 1 MG tablet Take 1 tablet (1 mg total) by mouth daily with breakfast. 30 tablet 1 09/14/2020 at Unknown time  . hydrALAZINE (APRESOLINE) 100 MG tablet Take 0.5 tablets (50 mg total) by mouth 2 (two) times daily.  09/14/2020 at Unknown time  . metoprolol succinate (TOPROL-XL) 25 MG 24 hr tablet Take 1 tablet (25 mg total) by mouth daily. 30 tablet 0 09/14/2020 at 0941  . pantoprazole (PROTONIX) 40 MG tablet Take 1 tablet (40 mg total) by mouth daily. 30 tablet 0 09/14/2020 at Unknown time  . psyllium (METAMUCIL) 58.6 % powder Take 1 packet by mouth daily.   09/14/2020 at Unknown time  . timolol (TIMOPTIC) 0.5 % ophthalmic solution Place 1 drop into both eyes 2 (two) times daily. 10 mL 6 09/14/2020 at Unknown time  . traMADol (ULTRAM) 50 MG tablet Take 50 mg by mouth 2 (two) times daily as needed for moderate pain.   09/13/2020 at Unknown time  . vitamin B-12 1000 MCG tablet Take 1 tablet (1,000 mcg total) by mouth daily. 30 tablet 0 09/14/2020 at Unknown time  . glucose blood test strip Accu-Chek Aviva Plus test strips  Take 1 strip 3 times a day by miscell. route for 90 days.     Marland Kitchen. glucose blood test strip Accu-Chek Aviva Plus test strips  USE AS DIRECTED THREE TIMES DAILY.      Scheduled: . aspirin EC  81 mg Oral Daily  . atorvastatin  20 mg Oral q1800  .  brimonidine  1 drop Both Eyes BID  . enoxaparin (LOVENOX) injection  40 mg Subcutaneous Q24H  . feeding supplement  237 mL Oral TID BM  . ferrous sulfate  325 mg Oral BID WC  . gabapentin  300 mg Oral BID  . insulin aspart  0-6 Units Subcutaneous TID WC  . liver oil-zinc oxide   Topical BID  . metoprolol succinate  25 mg Oral Daily  . nutrition supplement (JUVEN)  1 packet Oral Q0200  . pantoprazole  40 mg Oral Daily  . sodium chloride flush  3 mL Intravenous Q12H  . timolol  1 drop Both Eyes BID  . cyanocobalamin  1,000 mcg Oral Daily   Continuous: . sodium chloride    . ceFEPime (MAXIPIME) IV 2 g (09/15/20 1011)  . [START ON 09/16/2020] vancomycin     ZOX:WRUEAVPRN:sodium chloride, acetaminophen, ondansetron (ZOFRAN) IV, sodium chloride flush Anti-infectives (From admission, onward)   Start     Dose/Rate Route Frequency Ordered Stop   09/16/20 1500  vancomycin (VANCOCIN) IVPB 1000 mg/200 mL premix        1,000 mg 200 mL/hr over 60 Minutes Intravenous Every 24 hours 09/15/20 1359     09/15/20 1800  vancomycin (VANCOCIN) IVPB 1000 mg/200 mL premix  Status:  Discontinued        1,000 mg 200 mL/hr over 60 Minutes Intravenous Every 24 hours 09/14/20 1726 09/15/20 1359   09/15/20 1430  vancomycin (VANCOREADY) IVPB 1500 mg/300 mL        1,500 mg 150 mL/hr over 120 Minutes Intravenous  Once 09/15/20 1359 09/15/20 1710   09/14/20 2200  ceFEPIme (MAXIPIME) 2 g in sodium chloride 0.9 % 100 mL IVPB        2 g 200 mL/hr over 30 Minutes Intravenous Every 12 hours 09/14/20 1726     09/14/20 1730  vancomycin (VANCOREADY) IVPB 1500 mg/300 mL  Status:  Discontinued        1,500 mg 150 mL/hr over 120 Minutes Intravenous  Once 09/14/20 1726 09/15/20 1359      Results for orders placed or performed during the hospital encounter of 09/14/20 (from the past 48 hour(s))  CBC with Differential     Status: Abnormal  Collection Time: 09/14/20  2:45 PM  Result Value Ref Range   WBC 11.5 (H) 4.0 - 10.5 K/uL     RBC 2.74 (L) 4.22 - 5.81 MIL/uL   Hemoglobin 7.4 (L) 13.0 - 17.0 g/dL   HCT 16.1 (L) 39 - 52 %   MCV 89.4 80.0 - 100.0 fL   MCH 27.0 26.0 - 34.0 pg   MCHC 30.2 30.0 - 36.0 g/dL   RDW 09.6 04.5 - 40.9 %   Platelets 312 150 - 400 K/uL   nRBC 0.0 0.0 - 0.2 %   Neutrophils Relative % 75 %   Neutro Abs 8.7 (H) 1.7 - 7.7 K/uL   Lymphocytes Relative 13 %   Lymphs Abs 1.5 0.7 - 4.0 K/uL   Monocytes Relative 9 %   Monocytes Absolute 1.1 (H) 0.1 - 1.0 K/uL   Eosinophils Relative 1 %   Eosinophils Absolute 0.1 0.0 - 0.5 K/uL   Basophils Relative 1 %   Basophils Absolute 0.1 0.0 - 0.1 K/uL   Immature Granulocytes 1 %   Abs Immature Granulocytes 0.06 0.00 - 0.07 K/uL    Comment: Performed at [facility] Lab, [address] [place], [place] [address]  Basic metabolic panel     Status: Abnormal   Collection Time: 09/14/20  2:45 PM  Result Value Ref Range   Sodium 136 135 - 145 mmol/L   Potassium 5.1 3.5 - 5.1 mmol/L   Chloride 102 98 - 111 mmol/L   CO2 24 22 - 32 mmol/L   Glucose, Bld 235 (H) 70 - 99 mg/dL    Comment: Glucose reference range applies only to samples taken after fasting for at least 8 hours.   BUN 48 (H) 8 - 23 mg/dL   Creatinine, Ser 4.78 (H) 0.61 - 1.24 mg/dL   Calcium 8.4 (L) 8.9 - 10.3 mg/dL   GFR, Estimated 40 (L) >60 mL/min    Comment: (NOTE) Calculated using the CKD-EPI Creatinine Equation (2021)    Anion gap 10 5 - 15    Comment: Performed at [facility], [address], [place], [place] [address]  Troponin I (High Sensitivity)     Status: Abnormal   Collection Time: 09/14/20  2:45 PM  Result Value Ref Range   Troponin I (High Sensitivity) 29 (H) <18 ng/L    Comment: (NOTE) Elevated high sensitivity troponin I (hsTnI) values and significant  changes across serial measurements may suggest ACS but many other  chronic and acute conditions are known to elevate hsTnI results.  Refer to the "Links" section for chest pain algorithms and additional   guidance. Performed at [facility], [address], [place], [place] [address]   Brain natriuretic peptide     Status: Abnormal   Collection Time: 09/14/20  2:45 PM  Result Value Ref Range   B Natriuretic Peptide 1,634.6 (H) 0.0 - 100.0 pg/mL    Comment: Performed at [facility], [address], [place], [place] [address]  I-Stat venous blood gas, [facility] ED)     Status: Abnormal   Collection Time: 09/14/20  2:51 PM  Result Value Ref Range   pH, Ven 7.532 (H) 7.25 - 7.43   pCO2, Ven 33.2 (L) 44 - 60 mmHg   pO2, Ven 85.0 (H) 32 - 45 mmHg   Bicarbonate 27.9 20.0 - 28.0 mmol/L   TCO2 29 22 - 32 mmol/L   O2 Saturation 98.0 %   Acid-Base Excess 5.0 (H) 0.0 -  2.0 mmol/L   Sodium 138 135 - 145 mmol/L   Potassium 5.0 3.5 - 5.1 mmol/L   Calcium, Ion 1.06 (L) 1.15 - 1.40 mmol/L   HCT 22.0 (L) 39 - 52 %   Hemoglobin 7.5 (L) 13.0 - 17.0 g/dL   Sample type VENOUS   Troponin I (High Sensitivity)     Status: Abnormal   Collection Time: 09/14/20  5:11 PM  Result Value Ref Range   Troponin I (High Sensitivity) 57 (H) <18 ng/L    Comment: RESULT CALLED TO, READ BACK BY AND VERIFIED WITH: A.STRADER,RN @1823  09/14/2020 VANG.J (NOTE) Elevated high sensitivity troponin I (hsTnI) values and significant  changes across serial measurements may suggest ACS but many other  chronic and acute conditions are known to elevate hsTnI results.  Refer to the Links section for chest pain algorithms and additional  guidance. Performed at [facility] Lab, [address], [place], [place] [address]   Respiratory Panel by RT PCR (Flu A&B, Covid) - Nasopharyngeal Swab     Status: None   Collection Time: 09/14/20  5:39 PM   Specimen: Nasopharyngeal Swab  Result Value Ref Range   SARS Coronavirus 2 by RT PCR NEGATIVE NEGATIVE    Comment: (NOTE) SARS-CoV-2 target nucleic acids are NOT DETECTED.  The SARS-CoV-2 RNA is generally detectable in upper respiratoy specimens during the acute phase of  infection. The lowest concentration of SARS-CoV-2 viral copies this assay can detect is 131 copies/mL. A negative result does not preclude SARS-Cov-2 infection and should not be used as the sole basis for treatment or other patient management decisions. A negative result may occur with  improper specimen collection/handling, submission of specimen other than nasopharyngeal swab, presence of viral mutation(s) within the areas targeted by this assay, and inadequate number of viral copies (<131 copies/mL). A negative result must be combined with clinical observations, patient history, and epidemiological information. The expected result is Negative.  Fact Sheet for Patients:  https://www.moore.com/  Fact Sheet for Healthcare Providers:  https://www.young.biz/  This test is no t yet approved or cleared by the [place] FDA and  has been authorized for detection and/or diagnosis of SARS-CoV-2 by FDA under an Emergency Use Authorization (EUA). This EUA will remain  in effect (meaning this test can be used) for the duration of the COVID-19 declaration under Section 564(b)(1) of the Act, 21 U.S.C. section 360bbb-3(b)(1), unless the authorization is terminated or revoked sooner.     Influenza A by PCR NEGATIVE NEGATIVE   Influenza B by PCR NEGATIVE NEGATIVE    Comment: (NOTE) The Xpert Xpress SARS-CoV-2/FLU/RSV assay is intended as an aid in  the diagnosis of influenza from Nasopharyngeal swab specimens and  should not be used as a sole basis for treatment. Nasal washings and  aspirates are unacceptable for Xpert Xpress SARS-CoV-2/FLU/RSV  testing.  Fact Sheet for Patients: https://www.moore.com/  Fact Sheet for Healthcare Providers: https://www.young.biz/  This test is not yet approved or cleared by the [place] FDA and  has been authorized for detection and/or diagnosis of SARS-CoV-2 by  FDA  under an Emergency Use Authorization (EUA). This EUA will remain  in effect (meaning this test can be used) for the duration of the  Covid-19 declaration under Section 564(b)(1) of the Act, 21  U.S.C. section 360bbb-3(b)(1), unless the authorization is  terminated or revoked. Performed at [facility] Lab, [address] [place], [place] [address]   Culture, blood (Routine X 2) w Reflex to ID Panel  Status: None (Preliminary result)   Collection Time: 09/14/20  8:13 PM   Specimen: BLOOD RIGHT HAND  Result Value Ref Range   Specimen Description BLOOD RIGHT HAND    Special Requests      BOTTLES DRAWN AEROBIC ONLY Blood Culture adequate volume   Culture      NO GROWTH < 12 HOURS Performed at [facility], [address], [place], [place] [address]    Report Status PENDING   Culture, blood (Routine X 2) w Reflex to ID Panel     Status: None (Preliminary result)   Collection Time: 09/14/20  8:24 PM   Specimen: BLOOD LEFT HAND  Result Value Ref Range   Specimen Description BLOOD LEFT HAND    Special Requests      BOTTLES DRAWN AEROBIC ONLY Blood Culture results may not be optimal due to an inadequate volume of blood received in culture bottles   Culture      NO GROWTH < 12 HOURS Performed at [facility] Lab, [address], [place], [place] [address]    Report Status PENDING   Procalcitonin - Baseline     Status: None   Collection Time: 09/14/20  8:29 PM  Result Value Ref Range   Procalcitonin 1.04 ng/mL    Comment:        Interpretation: PCT > 0.5 ng/mL and <= 2 ng/mL: Systemic infection (sepsis) is possible, but other conditions are known to elevate PCT as well. (NOTE)       Sepsis PCT Algorithm           Lower Respiratory Tract                                      Infection PCT Algorithm    ----------------------------     ----------------------------         PCT < 0.25 ng/mL                PCT < 0.10 ng/mL          Strongly encourage             Strongly  discourage   discontinuation of antibiotics    initiation of antibiotics    ----------------------------     -----------------------------       PCT 0.25 - 0.50 ng/mL            PCT 0.10 - 0.25 ng/mL               OR       >80% decrease in PCT            Discourage initiation of                                            antibiotics      Encourage discontinuation           of antibiotics    ----------------------------     -----------------------------         PCT >= 0.50 ng/mL              PCT 0.26 - 0.50 ng/mL                AND       <80% decrease in PCT  Encourage initiation of                                             antibiotics       Encourage continuation           of antibiotics    ----------------------------     -----------------------------        PCT >= 0.50 ng/mL                  PCT > 0.50 ng/mL               AND         increase in PCT                  Strongly encourage                                      initiation of antibiotics    Strongly encourage escalation           of antibiotics                                     -----------------------------                                           PCT <= 0.25 ng/mL                                                 OR                                        > 80% decrease in PCT                                      Discontinue / Do not initiate                                             antibiotics  Performed at [facility] Lab, [address], [place], [place] [address]   Lactic acid, plasma     Status: None   Collection Time: 09/14/20  8:29 PM  Result Value Ref Range   Lactic Acid, Venous 1.4 0.5 - 1.9 mmol/L    Comment: Performed at [facility] Lab, [address] [place], [place] [address]  CBC     Status: Abnormal   Collection Time: 09/15/20  2:00 AM  Result Value Ref Range   WBC 11.2 (H) 4.0 - 10.5 K/uL   RBC 2.62 (L) 4.22 - 5.81 MIL/uL   Hemoglobin 7.2 (L) 13.0 - 17.0 g/dL   HCT 29.5  (L) 39 - 52 %  MCV 88.9 80.0 - 100.0 fL   MCH 27.5 26.0 - 34.0 pg   MCHC 30.9 30.0 - 36.0 g/dL   RDW 27.2 (H) 53.6 - 64.4 %   Platelets 277 150 - 400 K/uL   nRBC 0.0 0.0 - 0.2 %    Comment: Performed at [facility] Lab, [address] [place], [place] [address]  Basic metabolic panel     Status: Abnormal   Collection Time: 09/15/20  2:00 AM  Result Value Ref Range   Sodium 138 135 - 145 mmol/L   Potassium 4.8 3.5 - 5.1 mmol/L   Chloride 103 98 - 111 mmol/L   CO2 28 22 - 32 mmol/L   Glucose, Bld 122 (H) 70 - 99 mg/dL    Comment: Glucose reference range applies only to samples taken after fasting for at least 8 hours.   BUN 49 (H) 8 - 23 mg/dL   Creatinine, Ser 2.59 (H) 0.61 - 1.24 mg/dL   Calcium 8.4 (L) 8.9 - 10.3 mg/dL   GFR, Estimated 40 (L) >60 mL/min    Comment: (NOTE) Calculated using the CKD-EPI Creatinine Equation (2021)    Anion gap 7 5 - 15    Comment: Performed at [facility] Lab, [address], [place], [place] [address]  Glucose, capillary     Status: Abnormal   Collection Time: 09/15/20  7:38 AM  Result Value Ref Range   Glucose-Capillary 123 (H) 70 - 99 mg/dL    Comment: Glucose reference range applies only to samples taken after fasting for at least 8 hours.  Glucose, capillary     Status: Abnormal   Collection Time: 09/15/20  9:46 AM  Result Value Ref Range   Glucose-Capillary 148 (H) 70 - 99 mg/dL    Comment: Glucose reference range applies only to samples taken after fasting for at least 8 hours.  Glucose, capillary     Status: Abnormal   Collection Time: 09/15/20 12:12 PM  Result Value Ref Range   Glucose-Capillary 135 (H) 70 - 99 mg/dL    Comment: Glucose reference range applies only to samples taken after fasting for at least 8 hours.  Glucose, capillary     Status: Abnormal   Collection Time: 09/15/20  4:36 PM  Result Value Ref Range   Glucose-Capillary 203 (H) 70 - 99 mg/dL    Comment: Glucose reference range applies only to samples taken  after fasting for at least 8 hours.    CT Chest Wo Contrast  Result Date: 09/14/2020 CLINICAL DATA:  Pleural effusion, worsening hypoxia, multifocal pneumonia EXAM: CT CHEST WITHOUT CONTRAST TECHNIQUE: Multidetector CT imaging of the chest was performed following the standard protocol without IV contrast. COMPARISON:  08/27/2020 FINDINGS: Cardiovascular: Extensive multi-vessel coronary artery calcification. Mild global cardiomegaly. Mild left ventricular dilation. Relative hypoattenuation of the cardiac blood pool is in keeping with at least moderate anemia. No pericardial effusion. Central pulmonary arteries are enlarged in keeping with pulmonary arterial hypertension. Moderate atherosclerotic calcification is seen within the thoracic aorta. No aortic aneurysm. Mediastinum/Nodes: Visualized thyroid unremarkable. Shotty mediastinal adenopathy is likely reactive in nature. Right hilar adenopathy is not well assessed due to confluent pulmonary parenchymal disease on this noncontrast examination. The esophagus is unremarkable. Lungs/Pleura: There is persistent subtotal collapse of the left lower lobe and total collapse of the lateral segment of the right middle lobe and the entire right lower lobe. Stable consolidation within the superior segment of the a right lower lobe and progressive consolidation involving the  dependent right upper lobe. Increasing bronchial wall thickening within the aerated right upper lobe in keeping with airway inflammation. Stable trace interstitial pulmonary infiltrate most in keeping with superimposed mild interstitial pulmonary edema. Unchanged complex gas and fluid containing collection within the right pleural space most in keeping with a empyema measuring at least 5.2 x 13.0 x 10.7 cm in greatest dimension. There is associated pleural thickening, best noted at the lung base, in keeping with pleural inflammation. On the left, partially loculated small left pleural effusion is again  seen with loculated fluid within the major fissure, unchanged. Mild pleural thickening is again noted, suggesting a complex effusion such as a parapneumonic effusion. Superinfection is difficult to exclude on this examination. Upper Abdomen: No acute abnormality. Musculoskeletal: No acute bone abnormality. IMPRESSION: Progressive consolidation within the right upper lobe. Stable collapse and consolidation of the right middle lobe and lower lobes bilaterally. Stable complex gas and fluid collection within the right pleural space with associated pleural thickening most in keeping with a a empyema. Stable complex partially loculated small left pleural effusion largely within the interlobular fissure. Stable superimposed mild interstitial pulmonary edema. Extensive coronary artery calcification. Morphologic changes of pulmonary arterial hypertension. Aortic Atherosclerosis (ICD10-I70.0). Electronically Signed   By: Helyn Numbers MD   On: 09/14/2020 17:05   US RENAL  Result Date: 09/14/2020 CLINICAL DATA:  Acute renal injury EXAM: RENAL / URINARY TRACT ULTRASOUND COMPLETE COMPARISON:  None. FINDINGS: Right Kidney: Renal measurements: 10.5 x 5.1 x 5.6 cm. = volume: 158 mL. 1.5 cm cyst is noted in the upper pole. No mass lesion or hydronephrosis is noted. Small extrarenal pelvis is seen. Left Kidney: Renal measurements: 9.4 x 4.5 x 4.6 cm. = volume: 103 mL. Echogenicity within normal limits. No mass or hydronephrosis visualized. Bladder: Appears normal for degree of bladder distention. Other: Note is made of a small left-sided pleural effusion. IMPRESSION: Small left pleural effusion. Right renal cyst. No other focal abnormality is noted. Electronically Signed   By: Alcide Clever M.D.   On: 09/14/2020 18:46   DG Chest Portable 1 View  Result Date: 09/14/2020 CLINICAL DATA:  Shortness of breath. EXAM: PORTABLE CHEST 1 VIEW COMPARISON:  08/26/2020 radiographs and 08/27/2020 CT chest. FINDINGS: Bilateral patchy  airspace opacities, similar to prior. Similar appearance of a chronic right pleural fluid collection with internal gas. Suspect layering left pleural effusion. No discernible pneumothorax. Cardiac silhouette is enlarged. No acute osseous abnormality. Aortic atherosclerosis. IMPRESSION: 1. Similar bilateral airspace opacities, compatible with multifocal pneumonia. 2. Suspected layering left pleural effusion and similar chronic right pleural effusion with internal gas. Electronically Signed   By: Feliberto Harts MD   On: 09/14/2020 13:30   DG Foot 2 Views Left  Result Date: 09/15/2020 CLINICAL DATA:  Heel wound for several months, no known injury, initial encounter EXAM: LEFT FOOT - 2 VIEW COMPARISON:  08/09/2020 FINDINGS: Soft tissue wound is noted along the posterior aspect of the calcaneus. Calcaneal spurs are noted. No bony destruction is noted to suggest osteomyelitis. Degenerative changes of the tarsal bones are seen. Mild osteopenia is noted similar to that seen on the prior study. Forefoot soft tissue swelling is noted as well stable from the previous exam. IMPRESSION: Stable soft tissue swelling. No bony erosive changes to suggest osteomyelitis. Stable heel wound Electronically Signed   By: Alcide Clever M.D.   On: 09/15/2020 16:28    Review of Systems  Constitutional: Negative for chills and fever.  Respiratory: Negative for cough and shortness of  breath.   Cardiovascular: Negative for chest pain.   Blood pressure (!) 120/57, pulse 61, temperature 98.9 F (37.2 C), resp. rate 19, height 5\' 9"  (1.753 m), weight 63 kg, SpO2 98 %. Physical Exam Constitutional:      Appearance: He is ill-appearing.  Cardiovascular:     Rate and Rhythm: Normal rate and regular rhythm.     Heart sounds: Normal heart sounds. No murmur heard.   Pulmonary:     Effort: Pulmonary effort is normal.     Comments: Decreased breath sounds over both lower lobes. Abdominal:     General: Abdomen is flat. Bowel  sounds are normal.     Tenderness: There is no abdominal tenderness.  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 Open wound over the left heel with exposed bone.  Skin:    Comments: Sacral pressure sores noted by WOCN  Neurological:     General: No focal deficit present.     Mental Status: He is alert and oriented to person, place, and time.    CLINICAL DATA:  Pleural effusion, worsening hypoxia, multifocal pneumonia  EXAM: CT CHEST WITHOUT CONTRAST  TECHNIQUE: Multidetector CT imaging of the chest was performed following the standard protocol without IV contrast.  COMPARISON:  08/27/2020  FINDINGS: Cardiovascular: Extensive multi-vessel coronary artery calcification. Mild global cardiomegaly. Mild left ventricular dilation. Relative hypoattenuation of the cardiac blood pool is in keeping with at least moderate anemia. No pericardial effusion. Central pulmonary arteries are enlarged in keeping with pulmonary arterial hypertension. Moderate atherosclerotic calcification is seen within the thoracic aorta. No aortic aneurysm.  Mediastinum/Nodes: Visualized thyroid unremarkable. Shotty mediastinal adenopathy is likely reactive in nature. Right hilar adenopathy is not well assessed due to confluent pulmonary parenchymal disease on this noncontrast examination. The esophagus is unremarkable.  Lungs/Pleura: There is persistent subtotal collapse of the left lower lobe and total collapse of the lateral segment of the right middle lobe and the entire right lower lobe. Stable consolidation within the superior segment of the a right lower lobe and progressive consolidation involving the dependent right upper lobe. Increasing bronchial wall thickening within the aerated right upper lobe in keeping with airway inflammation.  Stable trace interstitial pulmonary infiltrate most in keeping with superimposed mild interstitial pulmonary edema.  Unchanged  complex gas and fluid containing collection within the right pleural space most in keeping with a empyema measuring at least 5.2 x 13.0 x 10.7 cm in greatest dimension. There is associated pleural thickening, best noted at the lung base, in keeping with pleural inflammation. On the left, partially loculated small left pleural effusion is again seen with loculated fluid within the major fissure, unchanged. Mild pleural thickening is again noted, suggesting a complex effusion such as a parapneumonic effusion. Superinfection is difficult to exclude on this examination.  Upper Abdomen: No acute abnormality.  Musculoskeletal: No acute bone abnormality.  IMPRESSION: Progressive consolidation within the right upper lobe. Stable collapse and consolidation of the right middle lobe and lower lobes bilaterally.  Stable complex gas and fluid collection within the right pleural space with associated pleural thickening most in keeping with a a empyema.  Stable complex partially loculated small left pleural effusion largely within the interlobular fissure.  Stable superimposed mild interstitial pulmonary edema.  Extensive coronary artery calcification. Morphologic changes of pulmonary arterial hypertension.  Aortic Atherosclerosis (ICD10-I70.0).   Electronically Signed   By: Helyn Numbers MD   On: 09/14/2020 17:05   Assessment/Plan:  This 74 year old chronically ill gentleman with  multiple comorbidities developed COVID-19 pneumonia about 5 weeks ago associated with development of a right empyema that was initially treated with a pigtail catheter.  He now returns with a persistent complex right pleural air and fluid collection with pleural thickening and collapse of the right middle and lower lobes with some collapse of the right upper lobe.  He also has a moderate partially loculated left pleural effusion with left lower lobe collapse.  I do not think he is a candidate for open  surgical treatment of either empyema given his age combined with chronic comorbidities including right above-knee amputation and pressure sores in the sacrum and left heel with exposed bone, general debilitation and poor nutrition.  I do not think he would recover from that.  The consolidation and collapse of the right lung has been going on for at least a month and I am not sure that the right lung will even reexpand if the right pleural fluid collection could be drained.  I think the best option for this patient is to consider percutaneous drainage by IR with consideration of thrombolytic therapy or palliative care.  Alleen Borne 09/15/2020, 5:27 PM

## 2020-09-15 NOTE — Progress Notes (Signed)
Initial Nutrition Assessment  DOCUMENTATION CODES:   Not applicable  INTERVENTION:  Continue Ensure Enlive po TID, each supplement provides 350 kcal and 20 grams of protein  Juven po daily, provides 95 calories, 2.5 grams of protein (collagen), and 9.8 grams of carbohydrate (3 grams sugar); also contains 7 grams of L-arginine and L-glutamine, 300 mg vitamin C, 15 mg vitamin E, 1.2 mcg vitamin B-12, 9.5 mg zinc, 200 mg calcium, and 1.5 g  Calcium Beta-hydroxy-Beta-methylbutyrate to support wound healing   NUTRITION DIAGNOSIS:   Increased nutrient needs related to acute illness (acute respiratory failure with hypoxia secondary to pneumonia/empyema) as evidenced by estimated needs.    GOAL:   Patient will meet greater than or equal to 90% of their needs    MONITOR:   Labs, I & O's, Supplement acceptance, PO intake, Weight trends, Skin  REASON FOR ASSESSMENT:   Consult Assessment of nutrition requirement/status  ASSESSMENT:  74 year old male with history of peripheral vascular disease s/p right AKI, chronic dCHF, HTN, DM2, COVID-19 pneumonia (diagnosed 9/16) who was recently hospitalized due to aspiration pneumonia and right-sided pleural effusion s/p thoracentesis and discharged to SNF presented with hypoxia.  Patient admitted with acute respiratory failure with hypoxia secondary to extensive consolidation on the right side/right-sided pneumonia/empyema. Cardiothoracic surgery consult for evaluation of VATS verses pigtail catheter placement pending.  RD working remotely.  Pt oriented to person and place only. RD attempted to contact pt via phone, received busy signal and unable to obtain history at this time. He was followed by nutrition department during last admission. Per notes, he had a good appetite, eating 50-100% of meals on regular diet and consuming Ensure Enlive and Juven supplements. No documented intakes this admission for review, will continue to monitor. Weights  have trended up ~9 lbs in the last month, suspect in part r/t fluid shifts given chronic dCHF with elevated BNP. Per chart, pt experienced significant 30 lb (19%) decrease from usual wt from August to September. Unsure of accuracy of history, however he was diagnosed with COVID-19 pneumonia in September, possible weight loss related to catabolic nature of virus. Will plan to complete exam at follow-up. He is receiving Ensure TID, will continue this and order Juven daily to support wound heeling.   I/Os: -60 ml since admit UOP: 300 ml since admit  Medications reviewed and include: Ferrous sulfate, Gabapentin, SSI, Protonix, B12, Maxipime, Vancomycin  Labs: CBGs 148,123, BUN 49 (H), Cr 1.75 (H), WBC 11.2 (H), Hgb 7.2 (L), HCT 23.3 (L), BNP 1634.6 (H) 9/17 A1c - 6.0  NUTRITION - FOCUSED PHYSICAL EXAM: Unable to complete at this time, RD working remotely.  Diet Order:   Diet Order            Diet heart healthy/carb modified Room service appropriate? Yes; Fluid consistency: Thin  Diet effective now                 EDUCATION NEEDS:   Not appropriate for education at this time  Skin:  Skin Assessment: Skin Integrity Issues: Skin Integrity Issues:: DTI DTI: L heel (10/4) Stage II: mid sacrum (10/4)  Last BM:  PTA  Height:   Ht Readings from Last 1 Encounters:  09/14/20 5\' 9"  (1.753 m)    Weight:   Wt Readings from Last 1 Encounters:  09/14/20 63 kg    Ideal Body Weight:  68.4 kg (Adj IBW for R AKA)  BMI:  Body mass index is 20.53 kg/m.  Estimated Nutritional Needs:  Kcal:  1900-2100  Protein:  90-100  Fluid:  > 1.5 L/day   Lars Masson, RD, LDN Clinical Nutrition After Hours/Weekend Pager # in Amion

## 2020-09-16 DIAGNOSIS — J9601 Acute respiratory failure with hypoxia: Secondary | ICD-10-CM | POA: Diagnosis not present

## 2020-09-16 LAB — GLUCOSE, CAPILLARY
Glucose-Capillary: 115 mg/dL — ABNORMAL HIGH (ref 70–99)
Glucose-Capillary: 135 mg/dL — ABNORMAL HIGH (ref 70–99)
Glucose-Capillary: 239 mg/dL — ABNORMAL HIGH (ref 70–99)
Glucose-Capillary: 161 mg/dL — ABNORMAL HIGH (ref 70–99)
Glucose-Capillary: 196 mg/dL — ABNORMAL HIGH (ref 70–99)

## 2020-09-16 LAB — BASIC METABOLIC PANEL
Anion gap: 10 (ref 5–15)
BUN: 51 mg/dL — ABNORMAL HIGH (ref 8–23)
CO2: 25 mmol/L (ref 22–32)
Calcium: 8.6 mg/dL — ABNORMAL LOW (ref 8.9–10.3)
Chloride: 102 mmol/L (ref 98–111)
Creatinine, Ser: 1.59 mg/dL — ABNORMAL HIGH (ref 0.61–1.24)
GFR, Estimated: 45 mL/min — ABNORMAL LOW (ref >60)
Glucose, Bld: 215 mg/dL — ABNORMAL HIGH (ref 70–99)
Potassium: 4.9 mmol/L (ref 3.5–5.1)
Sodium: 137 mmol/L (ref 135–145)

## 2020-09-16 NOTE — Progress Notes (Signed)
PROGRESS NOTE  Jeremiah [name]  DOB: 1946/08/30  PCP: Annita Brod, MD Jeremiah [name]  DOA: 09/14/2020  LOS: 2 days   Chief Complaint  Patient presents with  . Shortness of Breath   Brief narrative: Jeremiah [name] is a 74 y.o. male with PMH of DM2, HTN, HLD, PAD s/p right AKA, chronic diastolic CHF, Covid pneumonia in September and recently hospitalized 10/3-10/7 for aspiration/healthcare associated pneumonia. During his recent admission, he was noted to have right-sided pleural effusion for which he underwent thoracentesis. He was subsequently discharged to SNF.  Patient was to the ED from SNF on 09/14/2020 for hypoxia to 70s requiring up to 6 L oxygen by nasal cannula.  In the ED, he was confused, oriented to place and person only. He was afebrile and required up to 10 L of oxygen initially. Blood gas showed alkalotic pH of 7.53 with PCO2 low at 33, blood glucose level elevated to 35, creatinine elevated 1.73, BNP elevated to 1600s, lactic acid normal, procalcitonin elevated to 1.04, WBC count elevated to 11.5, hemoglobin low at 7.5  CT scan of chest was obtained which showed  -progressive consolidation within the right upper lobe.  -Stable collapse and consolidation of the right middle lobe and lower lobes bilaterally. -Stable complex gas and fluid collection within the right pleural space with associated pleural thickening most in keeping with an empyema. -Stable complex partially loculated small left pleural effusion largely within the interlobular fissure.Marland Kitchen He was admitted to hospital service for further evaluation management. CT surgery consultation was called.  Subjective: Patient was seen and examined this morning.   Lying down in bed.  Not in distress.  On 5 L oxygen by nasal cannula.   Creatinine improving, 1.59 today.  Blood glucose level this morning at 135.    Assessment/Plan: Progressive right upper lobe pneumonia -Presented with worsening shortness of breath,  hypoxia -CT scan finding as above include progressive consolidation within the right upper lobe -WBC count elevated, procalcitonin level elevated, lactate level normal. -Currently on empiric IV vancomycin and IV cefepime.  Will stop IV vancomycin.  Continue IV cefepime.  Pending final blood culture report. Recent Labs  Lab 09/14/20 1445 09/14/20 2029 09/15/20 0200  WBC 11.5*  --  11.2*  LATICACIDVEN  --  1.4  --   PROCALCITON  --  1.04  --    Right-sided empyema -Per CT chest, patient has stable complex gas and fluid collection within the right pleural space with associated pleural thickening most in keeping with an empyema. -Appreciate thoracic surgery consultation with Dr. Lavinia Sharps.  Patient is not a candidate for surgical intervention.  Ultrasound-guided thoracentesis recommended..  Order placed.  Acute on chronic respiratory failure with hypoxia -Chronically on 4 L oxygen by nasal cannula. Required 10 L initially. Currently on 5 L. Wean down as tolerated.  Left heel wound -Chronic wound, does not look acutely infected.   -x-ray left foot did not show suggest osteomyelitis.   -No need of IV vancomycin. -Continue wound care.  Wound care consult appreciated.  Mild acute exacerbation of chronic diastolic CHF Moderate aortic valve stenosis Essential hypertension -Home meds include Toprol 25 mg daily, amlodipine 10 mg daily, hydralazine 50 mg twice daily -Echo from July 2021 showed EF of 70 to 75%, moderate LVH, grade 1 diastolic dysfunction, moderate aortic valve stenosis. -Given 1 dose of Lasix 40 mg daily on admission.  Looks euvolemic.  Not on scheduled Lasix. -Blood pressure is stable on metoprolol.  Continue to hold amlodipine and hydralazine. -  Continue to monitor blood pressure. Hydralazine IV as needed.  PAD s/p right AKA Hyperlipidemia Elevated troponin -Home meds include aspirin 81 mg daily, Plavix 75 mg daily, Lipitor 20 mg at bedtime, -Continue aspirin and statin.  Plavix on hold for possible need of surgery. -Troponin elevation on admission was likely due to demand ischemia most likely  Diabetes mellitus type 2 -A1c 6 on 9/17 -On glimepiride 1 mg daily at home. -Kept on hold. Continue sliding scale insulin with Accu-Cheks. -Continue Neurontin 300 mg twice daily for neuropathy. Recent Labs  Lab 09/15/20 0738 09/15/20 0946 09/15/20 1212 09/15/20 1636 09/16/20 0735  GLUCAP 123* 148* 135* 203* 135*   AKI on CKD 3a -Baseline creatinine less than 1.5. -Creatinine was 1.73 on admission.  Improving gradually with IV fluid. Recent Labs    08/19/20 0842 08/20/20 1656 08/21/20 0507 08/26/20 2233 08/27/20 0307 08/28/20 0258 08/30/20 0141 09/14/20 1445 09/15/20 0200 09/16/20 0116  BUN 21 20 15  30* 32* 29* 34* 48* 49* 51*  CREATININE 1.34* 1.25* 1.23 1.70* 1.67* 1.46* 1.16 1.73* 1.75* 1.59*   Chronic anemia -Hemoglobin low but stable. -Continue Protonix 40 mg daily, iron supplement and vitamin B12 supplement. Recent Labs    08/19/20 0842 08/20/20 1656 08/21/20 0507 08/26/20 2233 08/27/20 0307 08/28/20 0258 08/30/20 0141 09/14/20 1445 09/14/20 1451 09/15/20 0200  HGB 7.0* 7.8* 7.7* 7.0* 6.6* 7.8* 7.7* 7.4* 7.5* 7.2*   Mobility: PT eval ordered Code Status:   Code Status: Full Code full code Nutritional status: Body mass index is 20.53 kg/m. Nutrition Problem: Increased nutrient needs Etiology: acute illness (acute respiratory failure with hypoxia secondary to pneumonia/empyema) Signs/Symptoms: estimated needs Diet Order            Diet heart healthy/carb modified Room service appropriate? Yes; Fluid consistency: Thin  Diet effective now                 DVT prophylaxis: enoxaparin (LOVENOX) injection 40 mg Start: 09/15/20 1000   Antimicrobials:  IV cefepime Fluid: None Consultants: CT surgery Family Communication:  No family at bedside.  Status is: Inpatient  Remains inpatient appropriate because patient needs  thoracentesis.   Dispo: The patient is from: SNF              Anticipated d/c is to: SNF              Anticipated d/c date is: 1 to 2 days              Patient currently is not medically stable to d/c.  Infusions:  . sodium chloride    . ceFEPime (MAXIPIME) IV 2 g (09/16/20 0836)    Scheduled Meds: . aspirin EC  81 mg Oral Daily  . atorvastatin  20 mg Oral q1800  . brimonidine  1 drop Both Eyes BID  . enoxaparin (LOVENOX) injection  40 mg Subcutaneous Q24H  . feeding supplement  237 mL Oral TID BM  . ferrous sulfate  325 mg Oral BID WC  . gabapentin  300 mg Oral BID  . insulin aspart  0-6 Units Subcutaneous TID WC  . liver oil-zinc oxide   Topical BID  . metoprolol succinate  25 mg Oral Daily  . nutrition supplement (JUVEN)  1 packet Oral Q0200  . pantoprazole  40 mg Oral Daily  . sodium chloride flush  3 mL Intravenous Q12H  . timolol  1 drop Both Eyes BID  . cyanocobalamin  1,000 mcg Oral Daily    Antimicrobials: Anti-infectives (From admission,  onward)   Start     Dose/Rate Route Frequency Ordered Stop   09/16/20 1500  vancomycin (VANCOCIN) IVPB 1000 mg/200 mL premix  Status:  Discontinued        1,000 mg 200 mL/hr over 60 Minutes Intravenous Every 24 hours 09/15/20 1359 09/16/20 0802   09/15/20 1800  vancomycin (VANCOCIN) IVPB 1000 mg/200 mL premix  Status:  Discontinued        1,000 mg 200 mL/hr over 60 Minutes Intravenous Every 24 hours 09/14/20 1726 09/15/20 1359   09/15/20 1430  vancomycin (VANCOREADY) IVPB 1500 mg/300 mL        1,500 mg 150 mL/hr over 120 Minutes Intravenous  Once 09/15/20 1359 09/15/20 1710   09/14/20 2200  ceFEPIme (MAXIPIME) 2 g in sodium chloride 0.9 % 100 mL IVPB        2 g 200 mL/hr over 30 Minutes Intravenous Every 12 hours 09/14/20 1726     09/14/20 1730  vancomycin (VANCOREADY) IVPB 1500 mg/300 mL  Status:  Discontinued        1,500 mg 150 mL/hr over 120 Minutes Intravenous  Once 09/14/20 1726 09/15/20 1359      PRN meds:     Objective: Vitals:   09/16/20 0524 09/16/20 0843  BP: 127/65 135/64  Pulse: 73   Resp: 14   Temp: 98 F (36.7 C)   SpO2: (!) 87%    No intake or output data in the 24 hours ending 09/16/20 1153 Filed Weights   09/14/20 1222  Weight: 63 kg   Weight change:  Body mass index is 20.53 kg/m.   Physical Exam: General exam: Appears calm and comfortable.  Not in physical distress Skin: No rashes, lesions or ulcers. HEENT: Atraumatic, normocephalic, supple neck, no obvious bleeding Lungs: Continues to have diminished air entry on the right CVS: Regular rate and rhythm, no murmur GI/Abd soft, nontender, nondistended, bowel sound present CNS: Alert, awake, oriented x3 Psychiatry: Mood appropriate Extremities: Right AKA status. Left foot with heel wound as above.  Data Review: I have personally reviewed the laboratory data and studies available.  Recent Labs  Lab 09/14/20 1445 09/14/20 1451 09/15/20 0200  WBC 11.5*  --  11.2*  NEUTROABS 8.7*  --   --   HGB 7.4* 7.5* 7.2*  HCT 24.5* 22.0* 23.3*  MCV 89.4  --  88.9  PLT 312  --  277   Recent Labs  Lab 09/14/20 1445 09/14/20 1451 09/15/20 0200 09/16/20 0116  NA 136 138 138 137  K 5.1 5.0 4.8 4.9  CL 102  --  103 102  CO2 24  --  28 25  GLUCOSE 235*  --  122* 215*  BUN 48*  --  49* 51*  CREATININE 1.73*  --  1.75* 1.59*  CALCIUM 8.4*  --  8.4* 8.6*    F/u labs ordered  Signed, Lorin Glass, MD [facility] 09/16/2020

## 2020-09-16 NOTE — Progress Notes (Signed)
09/16/2020 Patient had some dyspnea this evening. He was using is accessory muscles. Patient saturation was in the low to mid 80's on 6 Liters. MD was made aware. Per MD increase O2 to 8 Liter. Pt was satting 90%. [facility] RN.

## 2020-09-16 NOTE — Evaluation (Signed)
Physical Therapy Evaluation Patient Details Name: Jeremiah [name] MRN: [id number] DOB: [birth year] Today's Date: 09/16/2020   History of Present Illness  74 y.o. male with medical history significant of peripheral vascular disease, s/p right AKI, chronic diastolic heart failure, hypertension type 2 diabetes mellitus, diagnosed with COVID-19 pneumonia 35 days ago, discharged 10/7 from the hospital after being treated for pneumonia, recently treated aspiration pneumonia and right-sided pleural effusion with thoracentesis and discharged to SNF. Presents to ED with hypoxia 7-%O2 on 6L O2 via [employer]. labs were significant for elevated creatinine of 1.73, elevated BNP of 1634, troponin of 29, hemoglobin of 7.4 and wbc count of 11.5. CXR shows Similar bilateral airspace opacities, compatible with multifocal pneumonia. Admitted 1022 for treatment of Acute respiratory failure with hypoxia secondary to extensive consolidation on the right side/right-sided pneumonia/empyema on the right.  Clinical Impression  Evaluation limited due to pt increased WoB with employment of accessory muscles. Pt reports living at [facility] where he was able to transfer to his w/c with assist and mobilize independently in his w/c. Pt also reports independence with toileting, bathing and dressing, given pt recent hospitalization and maxAx2 level of assist for bed mobility this level of mobility is unlikely. Pt agreeable to assessment of extremities, where he exhibits increased weakness, however refuses to attempt to get to EoB due to fatigue. PT recommends return to SNF at discharge. PT will continue to follow acutely.     Follow Up Recommendations SNF    Equipment Recommendations  None recommended by PT       Precautions / Restrictions Precautions Precautions: Fall Precaution Comments: R AKA Restrictions Weight Bearing Restrictions: No      Mobility  Bed Mobility               General bed mobility comments: deferred  due acessory muscle breathing with increased effort, pt reports he is not able to today                Pertinent Vitals/Pain Pain Assessment: No/denies pain    Home Living Family/patient expects to be discharged to:: Skilled nursing facility                 Additional Comments: from [facility]    Prior Function Level of Independence: Needs assistance   Gait / Transfers Assistance Needed: reports assist with transfer to w/c states once in w/c pt mod I for mobility   ADL's / Homemaking Assistance Needed: reports independence with toileting, dressing and most bathing         Hand Dominance   Dominant Hand: Right    Extremity/Trunk Assessment   Upper Extremity Assessment Upper Extremity Assessment: RUE deficits/detail;LUE deficits/detail RUE Deficits / Details: AAROM WFL, strength grossly 3+/5 LUE Deficits / Details: AAROM WFL, strength grossly 3+/5    Lower Extremity Assessment Lower Extremity Assessment: RLE deficits/detail;LLE deficits/detail RLE Deficits / Details: R AKA baseline, hip flex and AD/AB WFL  LLE Deficits / Details: requires AAROM hip flex, knee ext/flex and ankle dorsi/plantr flex strength 2/5 LLE Sensation: decreased light touch;decreased proprioception;history of peripheral neuropathy LLE Coordination: decreased fine motor;decreased gross motor       Communication   Communication: No difficulties  Cognition Arousal/Alertness: Awake/alert Behavior During Therapy: WFL for tasks assessed/performed Overall Cognitive Status: No family/caregiver present to determine baseline cognitive functioning Area of Impairment: Safety/judgement;Awareness;Problem solving                       Following Commands: Follows  one step commands with increased time   Awareness: Emergent Problem Solving: Slow processing;Difficulty sequencing;Requires verbal cues;Requires tactile cues;Decreased initiation General Comments: requires extra time for command  follow       General Comments General comments (skin integrity, edema, etc.): L heel pressure sore with gauze in prevalon boot, SaO2 on 8L O2 via [place] 87-90%O2, HR 67, BP 124/66        Assessment/Plan    PT Assessment Patient needs continued PT services  PT Problem List Decreased strength;Decreased range of motion;Decreased activity tolerance;Decreased balance;Decreased mobility;Decreased coordination;Decreased knowledge of use of DME;Decreased skin integrity;Pain       PT Treatment Interventions DME instruction;Functional mobility training;Therapeutic activities;Therapeutic exercise;Balance training;Neuromuscular re-education;Patient/family education;Wheelchair mobility training    PT Goals (Current goals can be found in the Care Plan section)  Acute Rehab PT Goals Patient Stated Goal: return to independence PT Goal Formulation: With patient Time For Goal Achievement: 09/12/20 Potential to Achieve Goals: Fair    Frequency Min 2X/week    AM-PAC PT "6 Clicks" Mobility  Outcome Measure Help needed turning from your back to your side while in a flat bed without using bedrails?: Total Help needed moving from lying on your back to sitting on the side of a flat bed without using bedrails?: Total Help needed moving to and from a bed to a chair (including a wheelchair)?: Total Help needed standing up from a chair using your arms (e.g., wheelchair or bedside chair)?: Total Help needed to walk in hospital room?: Total Help needed climbing 3-5 steps with a railing? : Total 6 Click Score: 6    End of Session Equipment Utilized During Treatment: Oxygen Activity Tolerance: Patient limited by fatigue;Treatment limited secondary to medical complications (Comment) (increased O2 demand) Patient left: in bed;with call bell/phone within reach;with bed alarm set Nurse Communication: Mobility status;Other (comment) (increased WoB) PT Visit Diagnosis: Other abnormalities of gait and mobility  (R26.89);Muscle weakness (generalized) (M62.81)    Time: 1856-3149 PT Time Calculation (min) (ACUTE ONLY): 10 min   Charges:   PT Evaluation $PT Eval Moderate Complexity: 1 Mod          Venida Tsukamoto B. Beverely Risen PT, DPT Acute Rehabilitation Services Pager [phone] Office [phone]   Elon Alas Fleet 09/16/2020, 3:36 PM

## 2020-09-17 ENCOUNTER — Encounter (HOSPITAL_COMMUNITY): Payer: Self-pay | Admitting: Internal Medicine

## 2020-09-17 ENCOUNTER — Other Ambulatory Visit: Payer: Self-pay

## 2020-09-17 ENCOUNTER — Inpatient Hospital Stay (HOSPITAL_COMMUNITY): Payer: Medicare Other

## 2020-09-17 DIAGNOSIS — J9601 Acute respiratory failure with hypoxia: Secondary | ICD-10-CM | POA: Diagnosis not present

## 2020-09-17 LAB — CBC WITH DIFFERENTIAL/PLATELET
Abs Immature Granulocytes: 0.14 10*3/uL — ABNORMAL HIGH (ref 0.00–0.07)
Basophils Absolute: 0.1 10*3/uL (ref 0.0–0.1)
Basophils Relative: 0 %
Eosinophils Absolute: 0.2 10*3/uL (ref 0.0–0.5)
Eosinophils Relative: 1 %
HCT: 26.9 % — ABNORMAL LOW (ref 39.0–52.0)
Hemoglobin: 8.2 g/dL — ABNORMAL LOW (ref 13.0–17.0)
Immature Granulocytes: 1 %
Lymphocytes Relative: 9 %
Lymphs Abs: 1.3 10*3/uL (ref 0.7–4.0)
MCH: 27.6 pg (ref 26.0–34.0)
MCHC: 30.5 g/dL (ref 30.0–36.0)
MCV: 90.6 fL (ref 80.0–100.0)
Monocytes Absolute: 2 10*3/uL — ABNORMAL HIGH (ref 0.1–1.0)
Monocytes Relative: 14 %
Neutro Abs: 10.5 10*3/uL — ABNORMAL HIGH (ref 1.7–7.7)
Neutrophils Relative %: 75 %
Platelets: 312 10*3/uL (ref 150–400)
RBC: 2.97 MIL/uL — ABNORMAL LOW (ref 4.22–5.81)
RDW: 15.6 % — ABNORMAL HIGH (ref 11.5–15.5)
WBC: 14.1 10*3/uL — ABNORMAL HIGH (ref 4.0–10.5)
nRBC: 0 % (ref 0.0–0.2)

## 2020-09-17 LAB — GLUCOSE, CAPILLARY
Glucose-Capillary: 227 mg/dL — ABNORMAL HIGH (ref 70–99)
Glucose-Capillary: 140 mg/dL — ABNORMAL HIGH (ref 70–99)
Glucose-Capillary: 112 mg/dL — ABNORMAL HIGH (ref 70–99)
Glucose-Capillary: 132 mg/dL — ABNORMAL HIGH (ref 70–99)

## 2020-09-17 LAB — BLOOD GAS, ARTERIAL
Acid-Base Excess: 1.6 mmol/L (ref 0.0–2.0)
Acid-Base Excess: 2.1 mmol/L — ABNORMAL HIGH (ref 0.0–2.0)
Bicarbonate: 28.1 mmol/L — ABNORMAL HIGH (ref 20.0–28.0)
Bicarbonate: 27.5 mmol/L (ref 20.0–28.0)
Drawn by: 275531
FIO2: 100
FIO2: 100
O2 Saturation: 82 %
O2 Saturation: 99.1 %
Patient temperature: 37
Patient temperature: 37
pCO2 arterial: 66.5 mmHg (ref 32.0–48.0)
pCO2 arterial: 54 mmHg — ABNORMAL HIGH (ref 32.0–48.0)
pH, Arterial: 7.249 — ABNORMAL LOW (ref 7.350–7.450)
pH, Arterial: 7.327 — ABNORMAL LOW (ref 7.350–7.450)
pO2, Arterial: 53.7 mmHg — ABNORMAL LOW (ref 83.0–108.0)
pO2, Arterial: 158 mmHg — ABNORMAL HIGH (ref 83.0–108.0)

## 2020-09-17 LAB — BASIC METABOLIC PANEL
Anion gap: 11 (ref 5–15)
BUN: 51 mg/dL — ABNORMAL HIGH (ref 8–23)
CO2: 23 mmol/L (ref 22–32)
Calcium: 8.9 mg/dL (ref 8.9–10.3)
Chloride: 103 mmol/L (ref 98–111)
Creatinine, Ser: 1.49 mg/dL — ABNORMAL HIGH (ref 0.61–1.24)
GFR, Estimated: 48 mL/min — ABNORMAL LOW (ref >60)
Glucose, Bld: 175 mg/dL — ABNORMAL HIGH (ref 70–99)
Potassium: 5.9 mmol/L — ABNORMAL HIGH (ref 3.5–5.1)
Sodium: 137 mmol/L (ref 135–145)

## 2020-09-17 LAB — PROCALCITONIN: Procalcitonin: 0.7 ng/mL

## 2020-09-17 MED ORDER — LIDOCAINE HCL 1 % IJ SOLN
INTRAMUSCULAR | Status: AC
  Filled 2020-09-17: qty 20

## 2020-09-17 MED ORDER — FUROSEMIDE 10 MG/ML IJ SOLN
20.0000 mg | Freq: Once | INTRAMUSCULAR | Status: AC
  Administered 2020-09-17: 20 mg via INTRAVENOUS
  Filled 2020-09-17: qty 2

## 2020-09-17 NOTE — Progress Notes (Signed)
Attempted HFNC 15L patient did not tolerate SPO2 78%.  Placed patient back on BIPAP

## 2020-09-17 NOTE — Care Management Important Message (Signed)
Important Message  Patient Details  Name: Jeremiah [name] MRN: [id number] Date of Birth: 08-Dec-1945   Medicare Important Message Given:  Yes     Dorena Bodo 09/17/2020, 3:13 PM

## 2020-09-17 NOTE — Progress Notes (Signed)
Pt was weaned off the Bipap for 10 min pt desat in the low 60's. Pt  Was replaced back on Bipap at 100  Pt pull off his Bipap and dropped down to 30's. It took about 10 min for him to get back to 90. MD was notified about event.

## 2020-09-17 NOTE — NC FL2 (Signed)
[place] MEDICAID FL2 LEVEL OF CARE SCREENING TOOL     IDENTIFICATION  Patient Name: Jeremiah [name] Birthdate: [birth year] Sex: male Admission Date (Current Location): 09/14/2020  [facility] and [place] Number:  Producer, television/film/video and Address:  The [place], [address] [place], [place] [address]      Provider Number: [id number]  Attending Physician Name and Address:  Lorin Glass, MD  Relative Name and Phone Number:  [name], son, [phone]    Current Level of Care: Hospital Recommended Level of Care: Skilled Nursing Facility Prior Approval Number:    Date Approved/Denied:   PASRR Number: [id number]  Discharge Plan: SNF    Current Diagnoses: Patient Active Problem List   Diagnosis Date Noted  . CAP (community acquired pneumonia) 08/27/2020  . Pneumonia 08/27/2020  . Acute respiratory failure with hypoxia (HCC)   . Chest tube in place   . Pneumonia due to COVID-19 virus 08/09/2020  . Acute metabolic encephalopathy 08/09/2020  . Dysphagia 08/09/2020  . Weakness   . Acute lower UTI 06/25/2020  . Abdominal pain   . Gangrene (HCC) 06/18/2020  . Post-operative pain 04/21/2019  . Abnormality of gait 03/24/2019  . Hypoglycemia   . Hyperkalemia   . Hyponatremia   . Diabetic peripheral neuropathy (HCC)   . Labile blood glucose   . Enteritis due to Clostridium difficile   . Blood glucose abnormal   . Diarrhea   . Benign essential HTN   . Hypoalbuminemia due to protein-calorie malnutrition (HCC)   . Labile blood pressure   . 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 Postoperative pain   . Acute blood loss anemia   . Dyslipidemia   . Type 2 diabetes mellitus with peripheral neuropathy (HCC)   . S/P AKA (above knee amputation) (HCC) 11/12/2018  . Unilateral AKA, right (HCC)   . Hypertensive crisis   . Diabetes mellitus type 2 in nonobese (HCC)   . Pleural effusion 11/08/2018  . Fever   . Open wound of right foot   . Sepsis  (HCC) 11/03/2018  . Altered mental status   . Acute cystitis without hematuria   . Leukocytosis   . Pressure injury of skin 07/08/2018  . Clostridium difficile colitis 07/07/2018  . Hypokalemia 07/07/2018  . HLD (hyperlipidemia) 07/07/2018  . Acute diverticulitis 06/04/2018  . Cholecystitis 05/24/2018  . Cholecystitis with cholelithiasis 05/21/2018  . Acute systolic heart failure (HCC) 03/27/2018  . Diabetes mellitus without complication (HCC) 01/30/2018  . Hypertension 01/30/2018  . Acute cholecystitis 01/30/2018  . AKI (acute kidney injury) (HCC) 01/30/2018  . Normocytic anemia 01/30/2018  . PVD (peripheral vascular disease) (HCC) 09/05/2014    Orientation RESPIRATION BLADDER Height & Weight     Self, Time, Situation, Place  O2 Incontinent Weight: 139 lb (63 kg) Height:  5\' 9"  (175.3 cm)  BEHAVIORAL SYMPTOMS/MOOD NEUROLOGICAL BOWEL NUTRITION STATUS      Incontinent Diet (Heart healthy/carb modified.  See discharge summary)  AMBULATORY STATUS COMMUNICATION OF NEEDS Skin   Extensive Assist Verbally Other (Comment) (wound on heel)                       Personal Care Assistance Level of Assistance  Bathing, Feeding, Dressing Bathing Assistance: Maximum assistance Feeding assistance: Limited assistance Dressing Assistance: Maximum assistance     Functional Limitations Info  Sight, Speech Sight Info: Adequate Hearing Info: Impaired Speech Info: Adequate    SPECIAL CARE FACTORS FREQUENCY  PT (By licensed  PT), OT (By licensed OT)     PT Frequency: 5x week OT Frequency: 5x week            Contractures Contractures Info: Not present    Additional Factors Info  Code Status, Allergies Code Status Info: full Allergies Info: NKA   Insulin Sliding Scale Info: Novolog 0-6 units 3x daily with meals       Current Medications (09/17/2020):  This is the current hospital active medication list Current Facility-Administered Medications  Medication Dose Route  Frequency Provider Last Rate Last Admin  . 0.9 %  sodium chloride infusion  250 mL Intravenous PRN Kathlen Mody, MD      . acetaminophen (TYLENOL) tablet 1,000 mg  1,000 mg Oral Q6H PRN Kathlen Mody, MD      . aspirin EC tablet 81 mg  81 mg Oral Daily Kathlen Mody, MD   81 mg at 09/16/20 0838  . atorvastatin (LIPITOR) tablet 20 mg  20 mg Oral q1800 Kathlen Mody, MD   20 mg at 09/16/20 1704  . brimonidine (ALPHAGAN) 0.2 % ophthalmic solution 1 drop  1 drop Both Eyes BID Kathlen Mody, MD   1 drop at 09/17/20 1004  . ceFEPIme (MAXIPIME) 2 g in sodium chloride 0.9 % 100 mL IVPB  2 g Intravenous Q12H Calton Dach I, RPH 200 mL/hr at 09/17/20 1006 2 g at 09/17/20 1006  . enoxaparin (LOVENOX) injection 40 mg  40 mg Subcutaneous Q24H Kathlen Mody, MD   40 mg at 09/17/20 1004  . feeding supplement (ENSURE ENLIVE / ENSURE PLUS) liquid 237 mL  237 mL Oral TID BM Kathlen Mody, MD   237 mL at 09/16/20 2115  . ferrous sulfate tablet 325 mg  325 mg Oral BID WC Kathlen Mody, MD   325 mg at 09/16/20 1704  . gabapentin (NEURONTIN) capsule 300 mg  300 mg Oral BID Kathlen Mody, MD   300 mg at 09/16/20 2120  . insulin aspart (novoLOG) injection 0-6 Units  0-6 Units Subcutaneous TID WC Kathlen Mody, MD   2 Units at 09/17/20 1000  . liver oil-zinc oxide (DESITIN) 40 % ointment   Topical BID Lorin Glass, MD   Given at 09/17/20 1002  . metoprolol succinate (TOPROL-XL) 24 hr tablet 25 mg  25 mg Oral Daily Kathlen Mody, MD   25 mg at 09/16/20 0839  . nutrition supplement (JUVEN) (JUVEN) powder packet 1 packet  1 packet Oral Q0200 Lorin Glass, MD   1 packet at 09/17/20 0234  . ondansetron (ZOFRAN) injection 4 mg  4 mg Intravenous Q6H PRN Kathlen Mody, MD      . pantoprazole (PROTONIX) EC tablet 40 mg  40 mg Oral Daily Kathlen Mody, MD   40 mg at 09/16/20 0838  . sodium chloride flush (NS) 0.9 % injection 3 mL  3 mL Intravenous Q12H Kathlen Mody, MD   3 mL at 09/17/20 1001  . sodium chloride flush (NS)  0.9 % injection 3 mL  3 mL Intravenous PRN Kathlen Mody, MD      . timolol (TIMOPTIC) 0.5 % ophthalmic solution 1 drop  1 drop Both Eyes BID Kathlen Mody, MD   1 drop at 09/17/20 1002  . vitamin B-12 (CYANOCOBALAMIN) tablet 1,000 mcg  1,000 mcg Oral Daily Kathlen Mody, MD   1,000 mcg at 09/16/20 4782     Discharge Medications: Please see discharge summary for a list of discharge medications.  Relevant Imaging Results:  Relevant Lab Results:   Additional Information SS#:  [id number].  Lorri Frederick, LCSW

## 2020-09-17 NOTE — Progress Notes (Signed)
Patient presented to IR for a therapeutic thoracentesis. Limited ultrasound imaging did not show a fluid collection amenable to percutaneous drainage. One small area was identified but it was intermittently obscured by lung anatomy.   No IR procedure performed today. Dr. Lowella Dandy made aware.   Alwyn Ren, [place] [phone] 09/17/2020, 3:22 PM

## 2020-09-17 NOTE — Plan of Care (Signed)
  Problem: Clinical Measurements: Goal: Cardiovascular complication will be avoided Outcome: Progressing   Problem: Nutrition: Goal: Adequate nutrition will be maintained Outcome: Progressing   Problem: Coping: Goal: Level of anxiety will decrease Outcome: Progressing   Problem: Elimination: Goal: Will not experience complications related to bowel motility Outcome: Progressing   Problem: Elimination: Goal: Will not experience complications related to urinary retention Outcome: Progressing   Problem: Pain Managment: Goal: General experience of comfort will improve Outcome: Progressing

## 2020-09-17 NOTE — Progress Notes (Signed)
On chart review, I noted that patient's oxygen demand increased from 8 L/min to 15 L/min by NRB. Per RN documentation, RT was called. I am not sure if night MD was contacted. I do not see a supporting documentation. Patient did not have any chest x-ray or blood gas obtained. I reported my concern to the day charge nurse RN Tresa Endo. Obtain stat CXR, ABG.

## 2020-09-17 NOTE — Progress Notes (Signed)
On call Hospitalist contacted regarding an increase in oxygen demand. Pt in the mid to high 80s on 15L HFNC. RT evaluated & recommended placing pt on NRB. MD placed order for NRB with O2 greater than 90%

## 2020-09-17 NOTE — Progress Notes (Signed)
Pt sating in the mid to low 80s, contacted RT for evaluation. Pt placed on 15L NRB for 30 minutes. RT advised to replace NRB with high flow nasal cannula with humidification when patient is able to recover.

## 2020-09-17 NOTE — Progress Notes (Signed)
Pt continues to sat in the high 80s on 15L HFNC with humidification. RT contacted, advised to place pt back on NRB. Pt on NRB currently at 90%

## 2020-09-17 NOTE — Progress Notes (Addendum)
PROGRESS NOTE  Jeremiah [name]  DOB: 07/24/1946  PCP: Annita Brod, MD GQB:[id number]  DOA: 09/14/2020  LOS: 3 days   Chief Complaint  Patient presents with  . Shortness of Breath   Brief narrative: Jeremiah [name] is a 74 y.o. male with PMH of DM2, HTN, HLD, PAD s/p right AKA, chronic diastolic CHF, Covid pneumonia in September and recently hospitalized 10/3-10/7 for aspiration/healthcare associated pneumonia. During his recent admission, he was noted to have right-sided pleural effusion for which he underwent thoracentesis. He was subsequently discharged to SNF.  Patient was to the ED from SNF on 09/14/2020 for hypoxia to 70s requiring up to 6 L oxygen by nasal cannula.  In the ED, he was confused, oriented to place and person only. He was afebrile and required up to 10 L of oxygen initially. Blood gas showed alkalotic pH of 7.53 with PCO2 low at 33, blood glucose level elevated to 35, creatinine elevated 1.73, BNP elevated to 1600s, lactic acid normal, procalcitonin elevated to 1.04, WBC count elevated to 11.5, hemoglobin low at 7.5  CT scan of chest was obtained which showed  -progressive consolidation within the right upper lobe.  -Stable collapse and consolidation of the right middle lobe and lower lobes bilaterally. -Stable complex gas and fluid collection within the right pleural space with associated pleural thickening most in keeping with an empyema. -Stable complex partially loculated small left pleural effusion largely within the interlobular fissure.Marland Kitchen He was admitted to hospital service for further evaluation management. CT surgery consultation was called.  10/25, overnight, patient's respiratory status worsened. Blood gas this morning shows worsening CO2 retention with respiratory acidosis.  Patient has been initiated on BiPAP  Subjective: Patient was seen and examined this morning.   Lying down in bed.  Able to open eyes on command.  Denies any complaint but  patient is lethargic this morning.  On 100% nonrebreather oxygen since early this morning.   Blood gas with worsening CO2 retention  Assessment/Plan: Acute on chronic respiratory failure with hypoxia -Chronically on 4 L oxygen by nasal cannula. Required 10 L initially.  Gradually weaned down to 5 L/min.  However overnight his respiratory status worsened. -Blood gas this morning showed worsening CO2 retention with respiratory acidosis.  Patient has been initiated on BiPAP. -Repeat blood gas next few hours.  Wean down as tolerated.    Component Value Date/Time   PHART 7.249 (L) 09/17/2020 0754   PCO2ART 66.5 (HH) 09/17/2020 0754   PO2ART 53.7 (L) 09/17/2020 0754   HCO3 28.1 (H) 09/17/2020 0754   TCO2 29 09/14/2020 1451   O2SAT 82.0 09/17/2020 0754   Progressive right upper lobe pneumonia -Presented with worsening shortness of breath, hypoxia -CT scan finding as above include progressive consolidation within the right upper lobe -WBC count elevated, procalcitonin level elevated, lactate level normal. -Currently on empiric IV cefepime.   -As mentioned above, patient's respiratory status worsened overnight.  Chest x-ray repeated this morning shows slight increase in right pleural gas and fluid collection and persistent bilateral pulmonary opacities with greater opacification of the right lung base.   -WBC count up this morning but procalcitonin level is getting better.  I would continue the same antibiotics for now.  -I will give him a dose of Lasix 20 mg IV 1 time.  Recent Labs  Lab 09/14/20 1445 09/14/20 2029 09/15/20 0200 09/17/20 0301  WBC 11.5*  --  11.2* 14.1*  LATICACIDVEN  --  1.4  --   --   PROCALCITON  --  1.04  --  0.70   Right-sided empyema -Per CT chest, patient has stable complex gas and fluid collection within the right pleural space with associated pleural thickening most in keeping with an empyema. -Appreciate thoracic surgery consultation with Dr. Lavinia Sharps.  Patient is  not a candidate for surgical intervention.  Ultrasound-guided thoracentesis recommended.  -He might be able to get it later today once he is off BiPAP.  Left heel wound -Chronic wound, does not look acutely infected.   -x-ray left foot 10/23 did not suggest osteomyelitis.   -IV vancomycin stopped. -Continue wound care.  Wound care consult appreciated.  Mild acute exacerbation of chronic diastolic CHF Moderate aortic valve stenosis Essential hypertension -Echo from July 2021 showed EF of 70 to 75%, moderate LVH, grade 1 diastolic dysfunction, moderate aortic valve stenosis. -Home meds include Toprol 25 mg daily, amlodipine 10 mg daily, hydralazine 50 mg twice daily. -Not on Lasix at home. 1 dose IV Lasix was given on admission.  Because of worsening respiratory status, this morning I would repeat 1 more dose of Lasix 20 mg IV.  Patient does not look hypervolemic otherwise. -Blood pressure stable.  Continue metoprolol. Continue to hold amlodipine and hydralazine. -Continue to monitor blood pressure. Hydralazine IV as needed.  PAD s/p right AKA Hyperlipidemia Elevated troponin -Home meds include aspirin 81 mg daily, Plavix 75 mg daily, Lipitor 20 mg at bedtime, -Continue aspirin and statin. Plavix on hold for possible need of surgery. -Troponin elevation on admission was likely due to demand ischemia.  Diabetes mellitus type 2 -A1c 6 on 9/17 -On glimepiride 1 mg daily at home. -Currently on hold. Continue sliding scale insulin with Accu-Cheks. -Blood sugar level remains elevated close to 200.  However because of compromised oral intake from altered mental status and BiPAP dependence, I would not increase insulin dose for now. -Continue Neurontin 300 mg twice daily for neuropathy.  Recent Labs  Lab 09/16/20 0735 09/16/20 1205 09/16/20 1653 09/16/20 2103 09/17/20 0808  GLUCAP 135* 239* 161* 196* 227*   AKI on CKD 3a -Baseline creatinine less than 1.5. -Creatinine was 1.73 on  admission.  Creatinine improved with IV hydration.  Currently not on IV fluid for more than 24 hours. Recent Labs    08/20/20 1656 08/21/20 0507 08/26/20 2233 08/27/20 0307 08/28/20 0258 08/30/20 0141 09/14/20 1445 09/15/20 0200 09/16/20 0116 09/17/20 0301  BUN 20 15 30* 32* 29* 34* 48* 49* 51* 51*  CREATININE 1.25* 1.23 1.70* 1.67* 1.46* 1.16 1.73* 1.75* 1.59* 1.49*   Chronic anemia -Hemoglobin low but stable. -Continue Protonix 40 mg daily, iron supplement and vitamin B12 supplement. Recent Labs    08/20/20 1656 08/21/20 0507 08/26/20 2233 08/27/20 0307 08/28/20 0258 08/30/20 0141 09/14/20 1445 09/14/20 1451 09/15/20 0200 09/17/20 0301  HGB 7.8* 7.7* 7.0* 6.6* 7.8* 7.7* 7.4* 7.5* 7.2* 8.2*   Mobility: PT eval obtained.  SNF recommended Code Status:   Code Status: Full Code full code Nutritional status: Body mass index is 20.53 kg/m. Nutrition Problem: Increased nutrient needs Etiology: acute illness (acute respiratory failure with hypoxia secondary to pneumonia/empyema) Signs/Symptoms: estimated needs Diet Order            Diet heart healthy/carb modified Room service appropriate? Yes; Fluid consistency: Thin  Diet effective now                 DVT prophylaxis: enoxaparin (LOVENOX) injection 40 mg Start: 09/15/20 1000   Antimicrobials:  IV cefepime Fluid: None Consultants: CT surgery Family Communication:  I  called and updated patient's son Mr. Sharion BalloonBrian Kimber this morning.  He confirmed full CODE STATUS and would like to continue with that for now.  Status is: Inpatient  Remains inpatient appropriate because -respiratory status is getting worse.  Dispo: The patient is from: SNF              Anticipated d/c is to: SNF              Anticipated d/c date is: 2 to 3 days              Patient currently is not medically stable to d/c.  Infusions:  . sodium chloride    . ceFEPime (MAXIPIME) IV 2 g (09/16/20 2252)    Scheduled Meds: . aspirin EC  81 mg  Oral Daily  . atorvastatin  20 mg Oral q1800  . brimonidine  1 drop Both Eyes BID  . enoxaparin (LOVENOX) injection  40 mg Subcutaneous Q24H  . feeding supplement  237 mL Oral TID BM  . ferrous sulfate  325 mg Oral BID WC  . furosemide  20 mg Intravenous Once  . gabapentin  300 mg Oral BID  . insulin aspart  0-6 Units Subcutaneous TID WC  . liver oil-zinc oxide   Topical BID  . metoprolol succinate  25 mg Oral Daily  . nutrition supplement (JUVEN)  1 packet Oral Q0200  . pantoprazole  40 mg Oral Daily  . sodium chloride flush  3 mL Intravenous Q12H  . timolol  1 drop Both Eyes BID  . cyanocobalamin  1,000 mcg Oral Daily    Antimicrobials: Anti-infectives (From admission, onward)   Start     Dose/Rate Route Frequency Ordered Stop   09/16/20 1500  vancomycin (VANCOCIN) IVPB 1000 mg/200 mL premix  Status:  Discontinued        1,000 mg 200 mL/hr over 60 Minutes Intravenous Every 24 hours 09/15/20 1359 09/16/20 0802   09/15/20 1800  vancomycin (VANCOCIN) IVPB 1000 mg/200 mL premix  Status:  Discontinued        1,000 mg 200 mL/hr over 60 Minutes Intravenous Every 24 hours 09/14/20 1726 09/15/20 1359   09/15/20 1430  vancomycin (VANCOREADY) IVPB 1500 mg/300 mL        1,500 mg 150 mL/hr over 120 Minutes Intravenous  Once 09/15/20 1359 09/15/20 1710   09/14/20 2200  ceFEPIme (MAXIPIME) 2 g in sodium chloride 0.9 % 100 mL IVPB        2 g 200 mL/hr over 30 Minutes Intravenous Every 12 hours 09/14/20 1726     09/14/20 1730  vancomycin (VANCOREADY) IVPB 1500 mg/300 mL  Status:  Discontinued        1,500 mg 150 mL/hr over 120 Minutes Intravenous  Once 09/14/20 1726 09/15/20 1359      PRN meds:    Objective: Vitals:   09/17/20 0805 09/17/20 0903  BP: 127/71   Pulse: 76 74  Resp: 20 (!) 24  Temp: 97.7 F (36.5 C)   SpO2: 99% 95%    Intake/Output Summary (Last 24 hours) at 09/17/2020 0925 Last data filed at 09/17/2020 0336 Gross per 24 hour  Intake --  Output 1145 ml  Net  -1145 ml   Filed Weights   09/14/20 1222  Weight: 63 kg   Weight change:  Body mass index is 20.53 kg/m.   Physical Exam: General exam: Lethargic this morning, wakes up on command Skin: No rashes, lesions or ulcers. HEENT: Atraumatic, normocephalic, supple neck, no obvious  bleeding Lungs: Continues to have diminished air entry both sides, more on the right CVS: Regular rate and rhythm, no murmur GI/Abd soft, nontender, nondistended, bowel sound present CNS: Wakes up on commands, lethargic Psychiatry: Mood appropriate Extremities: Right AKA status. Left foot with heel wound as above.  Data Review: I have personally reviewed the laboratory data and studies available.  Recent Labs  Lab 09/14/20 1445 09/14/20 1451 09/15/20 0200 09/17/20 0301  WBC 11.5*  --  11.2* 14.1*  NEUTROABS 8.7*  --   --  10.5*  HGB 7.4* 7.5* 7.2* 8.2*  HCT 24.5* 22.0* 23.3* 26.9*  MCV 89.4  --  88.9 90.6  PLT 312  --  277 312   Recent Labs  Lab 09/14/20 1445 09/14/20 1451 09/15/20 0200 09/16/20 0116 09/17/20 0301  NA 136 138 138 137 137  K 5.1 5.0 4.8 4.9 5.9*  CL 102  --  103 102 103  CO2 24  --  28 25 23   GLUCOSE 235*  --  122* 215* 175*  BUN 48*  --  49* 51* 51*  CREATININE 1.73*  --  1.75* 1.59* 1.49*  CALCIUM 8.4*  --  8.4* 8.6* 8.9    F/u labs ordered  Signed, , MD [facility] 09/17/2020

## 2020-09-17 NOTE — Plan of Care (Signed)
  Problem: Education: Goal: Knowledge of General Education information will improve Description Including pain rating scale, medication(s)/side effects and non-pharmacologic comfort measures Outcome: Progressing   

## 2020-09-18 ENCOUNTER — Inpatient Hospital Stay (HOSPITAL_COMMUNITY): Payer: Medicare Other

## 2020-09-18 ENCOUNTER — Inpatient Hospital Stay (HOSPITAL_COMMUNITY): Payer: Medicare Other | Admitting: Registered Nurse

## 2020-09-18 ENCOUNTER — Encounter (HOSPITAL_BASED_OUTPATIENT_CLINIC_OR_DEPARTMENT_OTHER): Payer: Medicare Other | Admitting: Internal Medicine

## 2020-09-18 DIAGNOSIS — I469 Cardiac arrest, cause unspecified: Secondary | ICD-10-CM | POA: Diagnosis not present

## 2020-09-18 DIAGNOSIS — J9601 Acute respiratory failure with hypoxia: Secondary | ICD-10-CM

## 2020-09-18 LAB — BASIC METABOLIC PANEL
Anion gap: 11 (ref 5–15)
Anion gap: 10 (ref 5–15)
BUN: 61 mg/dL — ABNORMAL HIGH (ref 8–23)
BUN: 63 mg/dL — ABNORMAL HIGH (ref 8–23)
CO2: 25 mmol/L (ref 22–32)
CO2: 24 mmol/L (ref 22–32)
Calcium: 9 mg/dL (ref 8.9–10.3)
Calcium: 9.4 mg/dL (ref 8.9–10.3)
Chloride: 103 mmol/L (ref 98–111)
Chloride: 105 mmol/L (ref 98–111)
Creatinine, Ser: 1.52 mg/dL — ABNORMAL HIGH (ref 0.61–1.24)
Creatinine, Ser: 1.63 mg/dL — ABNORMAL HIGH (ref 0.61–1.24)
GFR, Estimated: 47 mL/min — ABNORMAL LOW (ref >60)
GFR, Estimated: 43 mL/min — ABNORMAL LOW (ref >60)
Glucose, Bld: 135 mg/dL — ABNORMAL HIGH (ref 70–99)
Glucose, Bld: 157 mg/dL — ABNORMAL HIGH (ref 70–99)
Potassium: 5.6 mmol/L — ABNORMAL HIGH (ref 3.5–5.1)
Potassium: 5.3 mmol/L — ABNORMAL HIGH (ref 3.5–5.1)
Sodium: 139 mmol/L (ref 135–145)
Sodium: 139 mmol/L (ref 135–145)

## 2020-09-18 LAB — GRAM STAIN

## 2020-09-18 LAB — CBC WITH DIFFERENTIAL/PLATELET
Abs Immature Granulocytes: 0.09 10*3/uL — ABNORMAL HIGH (ref 0.00–0.07)
Basophils Absolute: 0 10*3/uL (ref 0.0–0.1)
Basophils Relative: 0 %
Eosinophils Absolute: 0.1 10*3/uL (ref 0.0–0.5)
Eosinophils Relative: 1 %
HCT: 24.8 % — ABNORMAL LOW (ref 39.0–52.0)
Hemoglobin: 7.6 g/dL — ABNORMAL LOW (ref 13.0–17.0)
Immature Granulocytes: 1 %
Lymphocytes Relative: 8 %
Lymphs Abs: 1 10*3/uL (ref 0.7–4.0)
MCH: 27.6 pg (ref 26.0–34.0)
MCHC: 30.6 g/dL (ref 30.0–36.0)
MCV: 90.2 fL (ref 80.0–100.0)
Monocytes Absolute: 1.3 10*3/uL — ABNORMAL HIGH (ref 0.1–1.0)
Monocytes Relative: 10 %
Neutro Abs: 10.1 10*3/uL — ABNORMAL HIGH (ref 1.7–7.7)
Neutrophils Relative %: 80 %
Platelets: 292 10*3/uL (ref 150–400)
RBC: 2.75 MIL/uL — ABNORMAL LOW (ref 4.22–5.81)
RDW: 15.9 % — ABNORMAL HIGH (ref 11.5–15.5)
WBC: 12.7 10*3/uL — ABNORMAL HIGH (ref 4.0–10.5)
nRBC: 0 % (ref 0.0–0.2)

## 2020-09-18 LAB — ECHOCARDIOGRAM LIMITED
AR max vel: 1.01 cm2
AV Area VTI: 1.18 cm2
AV Area mean vel: 1 cm2
AV Mean grad: 16 mmHg
AV Peak grad: 28.7 mmHg
Ao pk vel: 2.68 m/s
Area-P 1/2: 4.57 cm2
Calc EF: 43.2 %
Height: 69 in
S' Lateral: 3.8 cm
Single Plane A2C EF: 37.9 %
Single Plane A4C EF: 43.4 %
Weight: 2224 oz

## 2020-09-18 LAB — POCT I-STAT 7, (LYTES, BLD GAS, ICA,H+H)
Acid-Base Excess: 1 mmol/L (ref 0.0–2.0)
Bicarbonate: 25.7 mmol/L (ref 20.0–28.0)
Calcium, Ion: 1.36 mmol/L (ref 1.15–1.40)
HCT: 21 % — ABNORMAL LOW (ref 39.0–52.0)
Hemoglobin: 7.1 g/dL — ABNORMAL LOW (ref 13.0–17.0)
O2 Saturation: 93 %
Patient temperature: 98.1
Potassium: 5.3 mmol/L — ABNORMAL HIGH (ref 3.5–5.1)
Sodium: 140 mmol/L (ref 135–145)
TCO2: 27 mmol/L (ref 22–32)
pCO2 arterial: 42.4 mmHg (ref 32.0–48.0)
pH, Arterial: 7.39 (ref 7.350–7.450)
pO2, Arterial: 67 mmHg — ABNORMAL LOW (ref 83.0–108.0)

## 2020-09-18 LAB — GLUCOSE, CAPILLARY
Glucose-Capillary: 101 mg/dL — ABNORMAL HIGH (ref 70–99)
Glucose-Capillary: 112 mg/dL — ABNORMAL HIGH (ref 70–99)
Glucose-Capillary: 115 mg/dL — ABNORMAL HIGH (ref 70–99)
Glucose-Capillary: 128 mg/dL — ABNORMAL HIGH (ref 70–99)
Glucose-Capillary: 158 mg/dL — ABNORMAL HIGH (ref 70–99)

## 2020-09-18 LAB — PHOSPHORUS: Phosphorus: 4.8 mg/dL — ABNORMAL HIGH (ref 2.5–4.6)

## 2020-09-18 LAB — PROCALCITONIN: Procalcitonin: 1.36 ng/mL

## 2020-09-18 LAB — MAGNESIUM: Magnesium: 2.6 mg/dL — ABNORMAL HIGH (ref 1.7–2.4)

## 2020-09-18 MED ORDER — FENTANYL CITRATE (PF) 100 MCG/2ML IJ SOLN: 100.0000 ug | Freq: Once | INTRAMUSCULAR | Status: AC

## 2020-09-18 MED ORDER — PANTOPRAZOLE SODIUM 40 MG PO PACK
40.0000 mg | PACK | Freq: Every day | ORAL | Status: DC
  Administered 2020-09-19 – 2020-09-20 (×2): 40 mg
  Filled 2020-09-18 (×3): qty 20

## 2020-09-18 MED ORDER — FENTANYL BOLUS VIA INFUSION
25.0000 ug | INTRAVENOUS | Status: DC | PRN
  Administered 2020-09-19: 25 ug via INTRAVENOUS
  Filled 2020-09-18: qty 25

## 2020-09-18 MED ORDER — GABAPENTIN 250 MG/5ML PO SOLN
300.0000 mg | Freq: Two times a day (BID) | ORAL | Status: DC
  Administered 2020-09-18 – 2020-09-20 (×5): 300 mg
  Filled 2020-09-18 (×6): qty 6

## 2020-09-18 MED ORDER — ACETAMINOPHEN 500 MG PO TABS: 1000.0000 mg | ORAL_TABLET | Freq: Four times a day (QID) | ORAL | Status: DC | PRN

## 2020-09-18 MED ORDER — FENTANYL 2500MCG IN NS 250ML (10MCG/ML) PREMIX INFUSION
25.0000 ug/h | INTRAVENOUS | Status: DC
  Administered 2020-09-18: 25 ug/h via INTRAVENOUS
  Filled 2020-09-18: qty 250

## 2020-09-18 MED ORDER — CHLORHEXIDINE GLUCONATE CLOTH 2 % EX PADS
6.0000 | MEDICATED_PAD | Freq: Every day | CUTANEOUS | Status: DC
  Administered 2020-09-18 – 2020-09-25 (×7): 6 via TOPICAL

## 2020-09-18 MED ORDER — ASPIRIN 81 MG PO CHEW
81.0000 mg | CHEWABLE_TABLET | Freq: Every day | ORAL | Status: DC
  Administered 2020-09-19 – 2020-09-20 (×2): 81 mg
  Filled 2020-09-18 (×2): qty 1

## 2020-09-18 MED ORDER — MIDAZOLAM HCL 2 MG/2ML IJ SOLN
INTRAMUSCULAR | Status: AC
  Administered 2020-09-18: 2 mg via INTRAVENOUS
  Filled 2020-09-18: qty 2

## 2020-09-18 MED ORDER — CHLORHEXIDINE GLUCONATE 0.12% ORAL RINSE (MEDLINE KIT)
15.0000 mL | Freq: Two times a day (BID) | OROMUCOSAL | Status: DC
  Administered 2020-09-18 – 2020-09-20 (×5): 15 mL via OROMUCOSAL

## 2020-09-18 MED ORDER — SODIUM ZIRCONIUM CYCLOSILICATE 5 G PO PACK
10.0000 g | PACK | Freq: Two times a day (BID) | ORAL | Status: AC
  Administered 2020-09-18 (×2): 10 g via ORAL
  Filled 2020-09-18 (×2): qty 2

## 2020-09-18 MED ORDER — VANCOMYCIN HCL IN DEXTROSE 1-5 GM/200ML-% IV SOLN
1000.0000 mg | INTRAVENOUS | Status: DC
  Administered 2020-09-19: 1000 mg via INTRAVENOUS
  Filled 2020-09-18: qty 200

## 2020-09-18 MED ORDER — MIDAZOLAM HCL 2 MG/2ML IJ SOLN: 2.0000 mg | Freq: Once | INTRAMUSCULAR | Status: AC

## 2020-09-18 MED ORDER — PIPERACILLIN-TAZOBACTAM 3.375 G IVPB
3.3750 g | Freq: Three times a day (TID) | INTRAVENOUS | Status: DC
  Administered 2020-09-18 – 2020-09-24 (×19): 3.375 g via INTRAVENOUS
  Filled 2020-09-18 (×21): qty 50

## 2020-09-18 MED ORDER — ATORVASTATIN CALCIUM 10 MG PO TABS
20.0000 mg | ORAL_TABLET | Freq: Every day | ORAL | Status: DC
  Administered 2020-09-18 – 2020-09-19 (×2): 20 mg
  Filled 2020-09-18 (×2): qty 2

## 2020-09-18 MED ORDER — VITAMIN B-12 1000 MCG PO TABS
1000.0000 ug | ORAL_TABLET | Freq: Every day | ORAL | Status: DC
  Administered 2020-09-19 – 2020-09-20 (×2): 1000 ug
  Filled 2020-09-18 (×3): qty 1

## 2020-09-18 MED ORDER — FENTANYL CITRATE (PF) 100 MCG/2ML IJ SOLN
INTRAMUSCULAR | Status: AC
  Administered 2020-09-18: 100 ug via INTRAVENOUS
  Filled 2020-09-18: qty 2

## 2020-09-18 MED ORDER — ORAL CARE MOUTH RINSE
15.0000 mL | OROMUCOSAL | Status: DC
  Administered 2020-09-18 – 2020-09-20 (×20): 15 mL via OROMUCOSAL

## 2020-09-18 MED ORDER — INSULIN ASPART 100 UNIT/ML ~~LOC~~ SOLN
0.0000 [IU] | SUBCUTANEOUS | Status: DC
  Administered 2020-09-18 – 2020-09-20 (×5): 1 [IU] via SUBCUTANEOUS
  Administered 2020-09-20: 2 [IU] via SUBCUTANEOUS
  Administered 2020-09-20: 1 [IU] via SUBCUTANEOUS
  Administered 2020-09-20 – 2020-09-21 (×2): 2 [IU] via SUBCUTANEOUS

## 2020-09-18 MED ORDER — FERROUS SULFATE 220 (44 FE) MG/5ML PO ELIX
220.0000 mg | ORAL_SOLUTION | Freq: Two times a day (BID) | ORAL | Status: DC
  Administered 2020-09-18 – 2020-09-21 (×5): 220 mg
  Filled 2020-09-18 (×7): qty 5

## 2020-09-18 MED ORDER — VANCOMYCIN HCL 1250 MG/250ML IV SOLN
1250.0000 mg | Freq: Once | INTRAVENOUS | Status: AC
  Administered 2020-09-18: 1250 mg via INTRAVENOUS
  Filled 2020-09-18: qty 250

## 2020-09-18 MED FILL — Medication: Qty: 1 | Status: AC

## 2020-09-18 NOTE — Progress Notes (Signed)
PROGRESS NOTE  Jeremiah [name]  DOB: January 07, 1946  PCP: Clovia Cuff, MD LZJ:[id number]  DOA: 09/14/2020  LOS: 4 days   Chief Complaint  Patient presents with  . Shortness of Breath   Brief narrative: Jeremiah [name] is a 74 y.o. male with PMH of DM2, HTN, HLD, PAD s/p right AKA, chronic diastolic CHF, Covid pneumonia in September and recently hospitalized 10/3-10/7 for aspiration/healthcare associated pneumonia. During his recent admission, he was noted to have right-sided pleural effusion for which he underwent thoracentesis. He was subsequently discharged to SNF.  Patient was to the ED from SNF on 09/14/2020 for hypoxia to 70s requiring up to 6 L oxygen by nasal cannula.  In the ED, he was confused, oriented to place and person only. He was afebrile and required up to 10 L of oxygen initially. Blood gas showed alkalotic pH of 7.53 with PCO2 low at 33, blood glucose level elevated to 35, creatinine elevated 1.73, BNP elevated to 1600s, lactic acid normal, procalcitonin elevated to 1.04, WBC count elevated to 11.5, hemoglobin low at 7.5  CT scan of chest was obtained which showed  -progressive consolidation within the right upper lobe.  -Stable collapse and consolidation of the right middle lobe and lower lobes bilaterally. -Stable complex gas and fluid collection within the right pleural space with associated pleural thickening most in keeping with an empyema. -Stable complex partially loculated small left pleural effusion largely within the interlobular fissure.Marland Kitchen He was admitted to hospital service for further evaluation management. CT surgery consultation was called.  10/25, overnight, patient's respiratory status worsened.  He was placed on BiPAP on and off for 24 hours. 10/26, patient was more alert, taken off BiPAP and put on 2 L oxygen by nasal cannula. Within an hour of that, patient went into asystole. CODE BLUE was called.  ACLS protocol ran for 10 minutes.  Two rounds of  epi, one sodium bicarb given.  Intubated at site.  ROSC achieved, patient was transferred to ICU.   Subjective: Patient was seen and examined this morning at the time of code. Earlier today, he was taken off BiPAP.  Chest x-ray was obtained which had shown some improvement compared to yesterday.  Assessment/Plan: Acute on chronic respiratory failure with hypoxia -Chronically on 4 L oxygen by nasal cannula. Required 10 L initially.   -He was being gradually weaned down.  However went into cardiac arrest this morning, currently intubated in ICU.    Progressive right upper lobe pneumonia -Presented with worsening shortness of breath, hypoxia -CT scan finding as above include progressive consolidation within the right upper lobe -Remains on empiric IV cefepime.   -As mentioned above, patient's respiratory status worsened in last 24 hours.  Intubated and transferred to ICU. Recent Labs  Lab 09/14/20 1445 09/14/20 2029 09/15/20 0200 09/17/20 0301 09/18/20 0357  WBC 11.5*  --  11.2* 14.1* 12.7*  LATICACIDVEN  --  1.4  --   --   --   PROCALCITON  --  1.04  --  0.70 1.36   Right-sided empyema -Per CT chest, patient has stable complex gas and fluid collection within the right pleural space with associated pleural thickening most in keeping with an empyema. -Appreciate thoracic surgery consultation with Dr. Caffie Pinto.  Patient is not a candidate for surgical intervention.  Ultrasound-guided thoracentesis was attempted on 10/25 but there was no adequate fluid to pull out.    Left heel wound -Chronic wound, does not look acutely infected.   -x-ray left foot 10/23  did not suggest osteomyelitis.   -IV vancomycin stopped. -Continue wound care.  Wound care consult appreciated.  Mild acute exacerbation of chronic diastolic CHF Moderate aortic valve stenosis Essential hypertension -Echo from July 2021 showed EF of 70 to 75%, moderate LVH, grade 1 diastolic dysfunction, moderate aortic valve  stenosis. -Home meds include Toprol 25 mg daily, amlodipine 10 mg daily, hydralazine 50 mg twice daily. Not on Lasix at home.  -To support respiratory status, two doses of IV Lasix were given, last dose yesterday.    PAD s/p right AKA Hyperlipidemia Elevated troponin -Home meds include aspirin 81 mg daily, Plavix 75 mg daily, Lipitor 20 mg at bedtime, -Troponin elevation on admission was likely due to demand ischemia.  Diabetes mellitus type 2 -A1c 6 on 9/17 -On glimepiride 1 mg daily at home. -Currently on sliding scale insulin with Accu-Cheks. Recent Labs  Lab 09/17/20 1213 09/17/20 1653 09/17/20 2105 09/18/20 0802 09/18/20 1110  GLUCAP 140* 112* 132* 115* 112*   AKI on CKD 3a -Baseline creatinine less than 1.5. -Creatinine was 1.73 on admission.  Creatinine improved with IV hydration.  Currently not on IV fluid for more than 24 hours. Recent Labs    08/21/20 0507 08/26/20 2233 08/27/20 0307 08/28/20 0258 08/30/20 0141 09/14/20 1445 09/15/20 0200 09/16/20 0116 09/17/20 0301 09/18/20 0357  BUN 15 30* 32* 29* 34* 48* 49* 51* 51* 61*  CREATININE 1.23 1.70* 1.67* 1.46* 1.16 1.73* 1.75* 1.59* 1.49* 1.52*   Chronic anemia -Hemoglobin low but stable. -Continue Protonix 40 mg daily, iron supplement and vitamin B12 supplement. Recent Labs    08/26/20 2233 08/27/20 0307 08/28/20 0258 08/30/20 0141 09/14/20 1445 09/14/20 1451 09/15/20 0200 09/17/20 0301 09/18/20 0357 09/18/20 1224  HGB 7.0* 6.6* 7.8* 7.7* 7.4* 7.5* 7.2* 8.2* 7.6* 7.1*   Mobility: PT eval obtained.  SNF recommended  Code Status:   Code Status: Full Code Nutritional status: Body mass index is 20.53 kg/m. Nutrition Problem: Increased nutrient needs Etiology: acute illness (acute respiratory failure with hypoxia secondary to pneumonia/empyema) Signs/Symptoms: estimated needs Diet Order            Diet heart healthy/carb modified Room service appropriate? Yes; Fluid consistency: Thin  Diet  effective now                 DVT prophylaxis: enoxaparin (LOVENOX) injection 40 mg Start: 09/15/20 1000   Antimicrobials:  IV cefepime Fluid: None Consultants: CT surgery Family Communication:  I called and updated patient's son Mr. Zaccary Creech yesterday morning.  This morning, Xarelto the hospital after patient was already transferred to ICU.  Critical care team updated the family.    Status is: Inpatient Remains inpatient appropriate because -respiratory status is getting worse.  Intubated and transferred to ICU today  Infusions:  . sodium chloride      Scheduled Meds: . [START ON 09/19/2020] aspirin  81 mg Per Tube Daily  . atorvastatin  20 mg Per Tube q1800  . brimonidine  1 drop Both Eyes BID  . chlorhexidine gluconate (MEDLINE KIT)  15 mL Mouth Rinse BID  . Chlorhexidine Gluconate Cloth  6 each Topical Daily  . enoxaparin (LOVENOX) injection  40 mg Subcutaneous Q24H  . feeding supplement  237 mL Oral TID BM  . ferrous sulfate  220 mg Per Tube BID WC  . gabapentin  300 mg Per Tube Q12H  . insulin aspart  0-6 Units Subcutaneous Q4H  . liver oil-zinc oxide   Topical BID  . mouth rinse  15 mL Mouth Rinse 10 times per day  . nutrition supplement (JUVEN)  1 packet Oral Q0200  . [START ON 09/19/2020] pantoprazole sodium  40 mg Per Tube Daily  . sodium chloride flush  3 mL Intravenous Q12H  . sodium zirconium cyclosilicate  10 g Oral BID  . timolol  1 drop Both Eyes BID  . [START ON 09/19/2020] cyanocobalamin  1,000 mcg Per Tube Daily    Antimicrobials: Anti-infectives (From admission, onward)   Start     Dose/Rate Route Frequency Ordered Stop   09/16/20 1500  vancomycin (VANCOCIN) IVPB 1000 mg/200 mL premix  Status:  Discontinued        1,000 mg 200 mL/hr over 60 Minutes Intravenous Every 24 hours 09/15/20 1359 09/16/20 0802   09/15/20 1800  vancomycin (VANCOCIN) IVPB 1000 mg/200 mL premix  Status:  Discontinued        1,000 mg 200 mL/hr over 60 Minutes Intravenous  Every 24 hours 09/14/20 1726 09/15/20 1359   09/15/20 1430  vancomycin (VANCOREADY) IVPB 1500 mg/300 mL        1,500 mg 150 mL/hr over 120 Minutes Intravenous  Once 09/15/20 1359 09/15/20 1710   09/14/20 2200  ceFEPIme (MAXIPIME) 2 g in sodium chloride 0.9 % 100 mL IVPB  Status:  Discontinued        2 g 200 mL/hr over 30 Minutes Intravenous Every 12 hours 09/14/20 1726 09/18/20 1309   09/14/20 1730  vancomycin (VANCOREADY) IVPB 1500 mg/300 mL  Status:  Discontinued        1,500 mg 150 mL/hr over 120 Minutes Intravenous  Once 09/14/20 1726 09/15/20 1359      PRN meds:    Objective: Vitals:   09/18/20 0448 09/18/20 0813  BP: (!) 148/61   Pulse: 76   Resp: (!) 22   Temp: 98 F (36.7 C)   SpO2: 100% 99%   No intake or output data in the 24 hours ending 09/18/20 1327 Filed Weights   09/14/20 1222  Weight: 63 kg   Weight change:  Body mass index is 20.53 kg/m.   Physical Exam prior to code: General exam: Was breathing okay on high flow oxygen nasal cannula. Skin: No rashes, lesions or ulcers. HEENT: Atraumatic, normocephalic, supple neck, no obvious bleeding Lungs: Continues to have diminished air entry both sides, more on the right CVS: Regular rate and rhythm, no murmur GI/Abd soft, nontender, nondistended, bowel sound present CNS: Wakes up on commands, lethargic Psychiatry: Mood appropriate Extremities: Right AKA status. Left foot with heel wound as above.  Data Review: I have personally reviewed the laboratory data and studies available.  Recent Labs  Lab 09/14/20 1445 09/14/20 1445 09/14/20 1451 09/15/20 0200 09/17/20 0301 09/18/20 0357 09/18/20 1224  WBC 11.5*  --   --  11.2* 14.1* 12.7*  --   NEUTROABS 8.7*  --   --   --  10.5* 10.1*  --   HGB 7.4*   < > 7.5* 7.2* 8.2* 7.6* 7.1*  HCT 24.5*   < > 22.0* 23.3* 26.9* 24.8* 21.0*  MCV 89.4  --   --  88.9 90.6 90.2  --   PLT 312  --   --  277 312 292  --    < > = values in this interval not displayed.    Recent Labs  Lab 09/14/20 1445 09/14/20 1451 09/15/20 0200 09/16/20 0116 09/17/20 0301 09/18/20 0357 09/18/20 1224  NA 136   < > 138 137 137 139 140  K 5.1   < > 4.8 4.9 5.9* 5.6* 5.3*  CL 102  --  103 102 103 103  --   CO2 24  --  $R'28 25 23 25  'wV$ --   GLUCOSE 235*  --  122* 215* 175* 135*  --   BUN 48*  --  49* 51* 51* 61*  --   CREATININE 1.73*  --  1.75* 1.59* 1.49* 1.52*  --   CALCIUM 8.4*  --  8.4* 8.6* 8.9 9.0  --    < > = values in this interval not displayed.    F/u labs ordered  Signed, Terrilee Croak, MD [facility] 09/18/2020

## 2020-09-18 NOTE — Code Documentation (Signed)
Code blue was called, reported to bedside and at that time primary team was running the code. Patient was resuscitated and transferred to ICU. Was present to sign off patient to ICU team.  .  Lerry Liner, DO PGY-2 IMTS Pager: [phone]

## 2020-09-18 NOTE — Progress Notes (Signed)
PT Cancellation Note  Patient Details Name: KAMARRION STFORT MRN: [id number] DOB: 16-Apr-1946   Cancelled Treatment:    Reason Eval/Treat Not Completed: Medical issues which prohibited therapy (code blue). Will follow-up for PT treatment as medically appropriate.  Ina Homes, PT, DPT Acute Rehabilitation Services  Pager [phone] Office [phone]  Malachy Chamber 09/18/2020, 10:32 AM

## 2020-09-18 NOTE — Procedures (Addendum)
Directly supervised.  CVC got stuck around 18cm, will see where ended up on CXR. Jeremiah Halsted MD PCCM  Central Venous Catheter Insertion Procedure Note  Jeremiah [name]  [id number]  31-Mar-1946  Date:09/18/20  Time:12:16 PM   Provider Performing:Iulia Huel Cote   Procedure: Insertion of Non-tunneled Central Venous Catheter(36556) with US guidance (27741)   Indication(s) Medication administration  Consent Unable to obtain consent due to emergent nature of procedure.  Anesthesia Topical only with 1% lidocaine   Timeout Verified patient identification, verified procedure, site/side was marked, verified correct patient position, special equipment/implants available, medications/allergies/relevant history reviewed, required imaging and test results available.  Sterile Technique Maximal sterile technique including full sterile barrier drape, hand hygiene, sterile gown, sterile gloves, mask, hair covering, sterile ultrasound probe cover (if used).  Procedure Description Area of catheter insertion was cleaned with chlorhexidine and draped in sterile fashion.  With real-time ultrasound guidance a central venous catheter was placed into the left internal jugular vein. Nonpulsatile blood flow and easy flushing noted in all ports.  The catheter was sutured in place and sterile dressing applied.  Complications/Tolerance None; patient tolerated the procedure well. Chest X-ray is ordered to verify placement for internal jugular or subclavian cannulation.    EBL Minimal  Specimen(s) None  Dr. Verdene Lennert Internal Medicine PGY-2  09/18/2020, 12:17 PM

## 2020-09-18 NOTE — Anesthesia Procedure Notes (Signed)
Procedure Name: Intubation Date/Time: 09/18/2020 10:24 AM Performed by: Trinna Post., CRNA Pre-anesthesia Checklist: Patient identified, Emergency Drugs available, Suction available, Patient being monitored and Timeout performed Patient Re-evaluated:Patient Re-evaluated prior to induction Oxygen Delivery Method: Ambu bag Preoxygenation: Pre-oxygenation with 100% oxygen Ventilation: Mask ventilation without difficulty Laryngoscope Size: Mac and 4 Grade View: Grade I Tube type: Oral Tube size: 7.5 mm Number of attempts: 2 Airway Equipment and Method: Stylet Placement Confirmation: ETT inserted through vocal cords under direct vision,  positive ETCO2 and breath sounds checked- equal and bilateral Secured at: 22 cm Tube secured with: Tape Dental Injury: Teeth and Oropharynx as per pre-operative assessment

## 2020-09-18 NOTE — Progress Notes (Signed)
  Echocardiogram 2D Echocardiogram has been performed.  Burnard Hawthorne 09/18/2020, 2:37 PM

## 2020-09-18 NOTE — Consult Note (Addendum)
09/18/2020   I have seen and evaluated the patient for cardiac arrest.  This was an in-hospital respiratory driven cardiac arrest of approximately 10 minutes duration.  Patient did not regain full consciousness.  S:  History is per chart review.  I have seen and evaluated the patient on prior admission.  He is a 74 year old (peripheral vascular disease s/p BKA, DM2, protein calorie malnutrition, aortic stenosis) chronically ill man who has not been doing well since a bout of Covid pneumonia in August/September.  I saw him last in late September for an acute on chronic right pleural effusion.  There was some question as to whether this effusion was infected and also question raised on the CT scan of endobronchial obstruction.  Therefore a pigtail was placed and the fluid was sampled.  It was transudate of consistent with a longstanding benign process.  Follow-up CT was performed after intrapleural lytics administered and showed a entrapped right lower and right middle lobe with no evidence of endobronchial obstruction.  The chest tube was removed and patient was sent home.    I did not think he would good decortication candidate due to his comorbidities, negative fluid, and relatively small size of pleural collection.  He unfortunately has been readmitted with what is thought to be aspiration pneumonia of both lungs now with loculated fluid collection on left as well as right.  He has progressive changes on his right pleural effusion including a thick rind raising concern for empyema.  He was evaluated by cardiothoracic surgery who also did not think he was a good decortication candidate and recommended IR guided drainage of the right fluid collection.  Patient's oxygenation has steadily worsened since admission on 09/14/2020 initially on 6 L oxygen then followed by BiPAP.  This morning he went into PEA arrest.    O: Blood pressure (!) 148/61, pulse 76, temperature 98 F (36.7 C), temperature source  Axillary, resp. rate (!) 22, height _0  (1.753 m), weight 63 kg, SpO2 99 %.  Ill-appearing man endotracheally intubated R pupil dilated, nonreactive, this is surgical per family Endotracheal tube with thick bloody green secretions, copious Lung sounds with rhonchi bilaterally, there is diminished breath sounds at the right base Heart sounds are regular, he has a systolic murmur, extremities warm He will gag on his tube and move all 4 extremities but not purposefully Labs are still trickling in  Potassium was slightly high this morning, hemoglobin low but stable Chest x-ray with bilateral infiltrates, worse on right and progressive right lower lobe collapse, ETT in good position, NG tube in good position  A:  Cardiac arrest suspected related to worsening hypoxemic and hypercarbic respiratory failure. Hypoxemic and hypercarbic respiratory failure secondary to bilateral pneumonia, he has been told he does not aspirate on prior SLP evaluations, his CT certainly looks like aspiration pneumonitis.  Alternatively this could be a post Covid inflammatory pneumonitis but less consistent with this. Severe depression since the passing of his wife, he was at wife's funeral on day of admission Acute on chronic right pleural effusion drained in September with pleural lytics administered.  Underlying lung did not expand completely which is consistent with a chronic process.  The fluid itself was benign by chemistry and cytology. Peripheral vascular disease Diabetes Muscular deconditioning Poor p.o. CKD with hyperkalemia  P:  - Avoid fevers - Limited echo - Fentanyl PRN for sedation - Usual lung protective ventilation strategy, will proceed with bronchoscopy to rule out any endobronchial issues causing worsening right lower lobe  collapse - We will evaluate right chest with ultrasound to see if further fluid can be sampled to reassure [place] that the fluid has not become infected - Switch antibiotics from  cefepime to vanc/Zosyn, f/u BAL data from bronch - May consider steroids if does not improve with above - Lokelma x 2, dose of calcium - Guarded prognosis relayed to family  The patient is critically ill with multiple organ systems failure and requires high complexity decision making for assessment and support, frequent evaluation and titration of therapies, application of advanced monitoring technologies and extensive interpretation of multiple databases. Critical Care Time devoted to patient care services described in this note independent of APP/resident  time is 45 minutes.   Oct 04, 2020 Jeremiah Emery MD      NAME:  Jeremiah [name], MRN:  [id number], DOB:  1946/07/10, LOS: 4 ADMISSION DATE:  09/14/2020, CONSULTATION DATE: 2020-10-04 REFERRING MD: [facility], CHIEF COMPLAINT: Status post arrest 10-04-20  Brief History   74 year old with a very complex past medical history status post arrest on Oct 04, 2020  History of present illness   Jeremiah [name] is a 74 year old male well-known to the pulmonary critical care practice as we had followed him for Covid pneumonia with a right pleural effusion status post chest tube placement thrombolytics with subsequent discharge to skilled nursing facility.  He returned to [facility] 09/14/2020 with increasing shortness of breath and required noninvasive mechanical ventilatory support up until 10-04-20.  He has been treated with antimicrobial therapy.  Once he was taken off noninvasive mechanical ventilatory support and placed on high flow oxygen he subsequently developed a respiratory arrest leading to cardiac arrest and was coded on 04-Oct-2020 for approximately 10 minutes two rounds of epi and intubation by CNRA on the floor.  Pulmonary critical care was asked to admit take over care while in the ICU.  Past Medical History   Past Medical History:  Diagnosis Date  . Amputee   . Aortic atherosclerosis ([place])   . Arthritis    "joints; shoulders, knees,  hands, back" (05/21/2018)  . Atherosclerosis of coronary artery   . C. difficile diarrhea 04/2018  . Diastolic CHF ([place])   . Diverticulosis   . High cholesterol   . History of gout   . Hypertension   . IDA (iron deficiency anemia)    from referral Dr Daphene Jaeger  . Internal hemorrhoids   . Peripheral vascular disease ([place])   . Pneumonia    "couple times" (05/21/2018)  . Sleep apnea    "has mask; won't use" (05/21/2018)  . Type II diabetes mellitus ([place])      Significant Hospital Events   09/15/2020 admission October 04, 2020 respiratory cardiac arrest  Consults:  10/04/20 pulmonary critical care  Procedures:  2020-10-04 intubation 10-04-2020 central line  Significant Diagnostic Tests:    Micro Data:  10-04-20 sputum culture  Antimicrobials:  09/14/2020 cefepime  Interim history/subjective:  74 year old male admitted with shortness of breath from nursing home with a history of chronic right-sided pleural effusion with suspected aspiration had been on noninvasive mechanical ventilatory support until the a.m. of 10-04-20 when he was placed on high flow oxygen.  He did have a potassium of 5.6 is noted.  It was 5.9 the day before.  Following removal noninvasive mechanical ventilatory support he developed what is presumed to be a respiratory arrest leading to cardiac arrest he was coded with return of circulation after epi x2 and intubation by the CRNA.  He is poorly responsive at this time.  Is been  transferred to intensive care unit.  Further discussion will be ongoing with family concerning goals of care.  Of note he has recent Covid pneumonia for which he was discharged to a nursing home.  Objective   Blood pressure (!) 148/61, pulse 76, temperature 98 F (36.7 C), temperature source Axillary, resp. rate (!) 22, height _0  (1.753 m), weight 63 kg, SpO2 99 %.       No intake or output data in the 24 hours ending 09/18/20 1120 Filed Weights   09/14/20 1222  Weight: 63 kg     Examination: General: Frail elderly male who is currently intubated poorly responsive HENT: Mild JVD is appreciated endotracheal tube is in place Lungs: Coarse rhonchi throughout right greater than left Cardiovascular: Heart sounds are distant Abdomen: Protuberant soft positive bowel sounds Extremities: Right above-the-knee amputation with well-healed scar, left foot with dressing on wound Neuro: Currently poorly responsive status post cardiac arrest  Resolved Hospital Problem list     Assessment & Plan:  Vent dependent respiratory failure in the setting of suspected respiratory arrest leading to cardiac arrest with multiple comorbidities including suspected chronic aspiration, chronic right pleural effusion is required chest tube and lytics in the past.  Currently in the ICU status post arrest. Stat portable chest x-ray Stat fiberoptic bronchoscopy to evaluate patency of airway Possible chest tube insertion Continue antimicrobial therapy for suspected pneumonia Tracheal aspiration for culture Bronchodilator therapy  Hyperkalemia Recent Labs  Lab 09/16/20 0116 09/17/20 0301 09/18/20 0357  K 4.9 5.9* 5.6*   Standard treatment for hyperkalemia once repeat potassium is confirmed.  Chronic failure to thrive CODE STATUS discussion with family  Hyperglycemia Sliding-scale insulin protocol  Peripheral vascular disease status post above-the-knee amputation right and chronic left foot ulcer Wound ostomy care   Renal insufficiency Lab Results  Component Value Date   CREATININE 1.52 (H) 09/18/2020   CREATININE 1.49 (H) 09/17/2020   CREATININE 1.59 (H) 09/16/2020   Monitor renal function Avoid nephrotoxins          Best practice:  Diet: NPO Pain/Anxiety/Delirium protocol (if indicated): As indicated VAP protocol (if indicated): In place DVT prophylaxis: Lovenox GI prophylaxis: PPI Glucose control: Sliding scale insulin protocol Mobility: Bedrest Code  Status: Full Family Communication: Son updated at bedside Disposition: Transfer to the intensive care unit 09/18/2020 status post arrest  Labs   CBC: Recent Labs  Lab 09/14/20 1445 09/14/20 1451 09/15/20 0200 09/17/20 0301 09/18/20 0357  WBC 11.5*  --  11.2* 14.1* 12.7*  NEUTROABS 8.7*  --   --  10.5* 10.1*  HGB 7.4* 7.5* 7.2* 8.2* 7.6*  HCT 24.5* 22.0* 23.3* 26.9* 24.8*  MCV 89.4  --  88.9 90.6 90.2  PLT 312  --  277 312 003    Basic Metabolic Panel: Recent Labs  Lab 09/14/20 1445 09/14/20 1445 09/14/20 1451 09/15/20 0200 09/16/20 0116 09/17/20 0301 09/18/20 0357  NA 136   < > 138 138 137 137 139  K 5.1   < > 5.0 4.8 4.9 5.9* 5.6*  CL 102  --   --  103 102 103 103  CO2 24  --   --  _1 GLUCOSE 235*  --   --  122* 215* 175* 135*  BUN 48*  --   --  49* 51* 51* 61*  CREATININE 1.73*  --   --  1.75* 1.59* 1.49* 1.52*  CALCIUM 8.4*  --   --  8.4* 8.6* 8.9 9.0   < > =  values in this interval not displayed.   GFR: Estimated Creatinine Clearance: 36.9 mL/min (A) (by C-G formula based on SCr of 1.52 mg/dL (H)). Recent Labs  Lab 09/14/20 1445 09/14/20 2029 09/15/20 0200 09/17/20 0301 09/18/20 0357  PROCALCITON  --  1.04  --  0.70 1.36  WBC 11.5*  --  11.2* 14.1* 12.7*  LATICACIDVEN  --  1.4  --   --   --     Liver Function Tests: No results for input(s): AST, ALT, ALKPHOS, BILITOT, PROT, ALBUMIN in the last 168 hours. No results for input(s): LIPASE, AMYLASE in the last 168 hours. No results for input(s): AMMONIA in the last 168 hours.  ABG    Component Value Date/Time   PHART 7.327 (L) 09/17/2020 1115   PCO2ART 54.0 (H) 09/17/2020 1115   PO2ART 158 (H) 09/17/2020 1115   HCO3 27.5 09/17/2020 1115   TCO2 29 09/14/2020 1451   O2SAT 99.1 09/17/2020 1115     Coagulation Profile: No results for input(s): INR, PROTIME in the last 168 hours.  Cardiac Enzymes: No results for input(s): CKTOTAL, CKMB, CKMBINDEX, TROPONINI in the last 168  hours.  HbA1C: Hgb A1c MFr Bld  Date/Time Value Ref Range Status  08/10/2020 12:33 AM 6.0 (H) 4.8 - 5.6 % Final    Comment:    (NOTE) Pre diabetes:          5.7%-6.4%  Diabetes:              >6.4%  Glycemic control for   <7.0% adults with diabetes   06/18/2020 03:59 PM 6.1 (H) 4.8 - 5.6 % Final    Comment:    (NOTE) Pre diabetes:          5.7%-6.4%  Diabetes:              >6.4%  Glycemic control for   <7.0% adults with diabetes     CBG: Recent Labs  Lab 09/17/20 1213 09/17/20 1653 09/17/20 2105 09/18/20 0802 09/18/20 1110  GLUCAP 140* 112* 132* 115* 112*    Review of Systems:   NA  Past Medical History  He,  has a past medical history of Amputee, Aortic atherosclerosis ([place]), Arthritis, Atherosclerosis of coronary artery, C. difficile diarrhea (44/0102), Diastolic CHF ([place]), Diverticulosis, High cholesterol, History of gout, Hypertension, IDA (iron deficiency anemia), Internal hemorrhoids, Peripheral vascular disease ([place]), Pneumonia, Sleep apnea, and Type II diabetes mellitus ([place]).   Surgical History    Past Surgical History:  Procedure Laterality Date  . ABDOMINAL AORTOGRAM W/LOWER EXTREMITY N/A 11/01/2018   Procedure: ABDOMINAL AORTOGRAM W/LOWER EXTREMITY;  Surgeon: Lorretta Harp, MD;  Location: [facility];  Service: Cardiovascular;  Laterality: N/A;  . ABDOMINAL AORTOGRAM W/LOWER EXTREMITY Left 06/20/2020   Procedure: ABDOMINAL AORTOGRAM W/LOWER EXTREMITY;  Surgeon: Marty Heck, MD;  Location: [facility];  Service: Cardiovascular;  Laterality: Left;  . AMPUTATION Right 11/09/2018   Procedure: RIGHT - AMPUTATION ABOVE KNEE;  Surgeon: Waynetta Sandy, MD;  Location: [facility];  Service: Vascular;  Laterality: Right;  . CATARACT EXTRACTION W/ INTRAOCULAR LENS  IMPLANT, BILATERAL Bilateral   . CHOLECYSTECTOMY  05/21/2018   ATTEMPTED LAPAROSCOPIC CHOLECYSTECTOMY, OPEN DRAINAGE OF GALLBLADDER WITH BIOPSY  . CHOLECYSTECTOMY N/A  05/21/2018   Procedure: ATTEMPTED LAPAROSCOPIC CHOLECYSTECTOMY, OPEN DRAINAGE OF GALLBLADDER WITH BIOPSY;  Surgeon: Jovita Kussmaul, MD;  Location: [facility];  Service: General;  Laterality: N/A;  . COLONOSCOPY W/ POLYPECTOMY    . COLONOSCOPY WITH PROPOFOL N/A 09/16/2018  Procedure: COLONOSCOPY WITH PROPOFOL;  Surgeon: Wilford Corner, MD;  Location: [facility];  Service: Endoscopy;  Laterality: N/A;  . FECAL TRANSPLANT N/A 09/16/2018   Procedure: FECAL TRANSPLANT;  Surgeon: Wilford Corner, MD;  Location: [facility];  Service: Endoscopy;  Laterality: N/A;  . FLEXIBLE SIGMOIDOSCOPY N/A 04/20/2020   Procedure: FLEXIBLE SIGMOIDOSCOPY;  Surgeon: Rush Landmark Telford Nab., MD;  Location: [facility];  Service: Gastroenterology;  Laterality: N/A;  Fecal Disimpaction  . IR CATHETER TUBE CHANGE  04/14/2018  . IR CHOLANGIOGRAM EXISTING TUBE  03/17/2018  . IR PERC CHOLECYSTOSTOMY  01/31/2018  . IR RADIOLOGIST EVAL & MGMT  03/02/2018  . PERIPHERAL VASCULAR INTERVENTION Left 06/20/2020   Procedure: PERIPHERAL VASCULAR INTERVENTION;  Surgeon: Marty Heck, MD;  Location: [facility];  Service: Cardiovascular;  Laterality: Left;  4 SFA STENTS     Social History   reports that he quit smoking about 36 years ago. His smoking use included cigarettes. He quit after 24.00 years of use. He has never used smokeless tobacco. He reports previous alcohol use. He reports that he does not use drugs.   Family History   His family history includes Diabetes in his brother, mother, and sister; Heart attack in his brother and father; Heart disease in his brother and father; Hypertension in his brother, mother, and sister.   Allergies No Known Allergies   Home Medications  Prior to Admission medications   Medication Sig Start Date End Date Taking? Authorizing Provider  acetaminophen (TYLENOL) 500 MG tablet Take 1,000 mg by mouth in the morning, at noon, and at bedtime.    Yes [provider]   amLODipine (NORVASC) 10 MG tablet Take 10 mg by mouth daily. 07/26/20  Yes [provider]  Ascorbic Acid (VITAMIN C PO) Take 2 tablets by mouth daily.   Yes [provider]  aspirin 81 MG tablet Take 81 mg by mouth daily.   Yes [provider]  atorvastatin (LIPITOR) 20 MG tablet Take 1 tablet (20 mg total) by mouth daily. Patient taking differently: Take 20 mg by mouth at bedtime.  12/06/18  Yes Angiulli, Lavon Paganini, PA-C  brimonidine (ALPHAGAN) 0.2 % ophthalmic solution Place 1 drop into both eyes 2 (two) times daily. 12/06/18  Yes Angiulli, Lavon Paganini, PA-C  clopidogrel (PLAVIX) 75 MG tablet Take 1 tablet (75 mg total) by mouth daily with breakfast. 06/22/20  Yes Nita Sells, MD  ferrous sulfate 325 (65 FE) MG tablet Take 1 tablet (325 mg total) by mouth 2 (two) times daily with a meal. 06/28/20  Yes Domenic Polite, MD  gabapentin (NEURONTIN) 300 MG capsule Take 1 capsule (300 mg total) by mouth 2 (two) times daily. 06/28/20  Yes Domenic Polite, MD  glimepiride (AMARYL) 1 MG tablet Take 1 tablet (1 mg total) by mouth daily with breakfast. 12/06/18  Yes Angiulli, Lavon Paganini, PA-C  hydrALAZINE (APRESOLINE) 100 MG tablet Take 0.5 tablets (50 mg total) by mouth 2 (two) times daily. 08/30/20  Yes Bonnielee Haff, MD  metoprolol succinate (TOPROL-XL) 25 MG 24 hr tablet Take 1 tablet (25 mg total) by mouth daily. 08/30/20 09/29/20 Yes Bonnielee Haff, MD  pantoprazole (PROTONIX) 40 MG tablet Take 1 tablet (40 mg total) by mouth daily. 08/21/20 09/20/20 Yes Elodia Florence., MD  psyllium (METAMUCIL) 58.6 % powder Take 1 packet by mouth daily.   Yes [provider]  timolol (TIMOPTIC) 0.5 % ophthalmic solution Place 1 drop into both eyes 2 (two) times daily. 12/06/18  Yes  Angiulli, Lavon Paganini, PA-C  traMADol (ULTRAM) 50 MG tablet Take 50 mg by mouth 2 (two) times daily as needed for moderate pain.   Yes [provider]  vitamin B-12 1000 MCG tablet Take 1 tablet  (1,000 mcg total) by mouth daily. 08/21/20 09/20/20 Yes Elodia Florence., MD  glucose blood test strip Accu-Chek Aviva Plus test strips  Take 1 strip 3 times a day by miscell. route for 90 days.    [provider]  glucose blood test strip Accu-Chek Aviva Plus test strips  USE AS DIRECTED THREE TIMES DAILY.    [provider]     Critical care time: 45 min     Richardson Landry Minor ACNP Acute Care Nurse Practitioner [place] Please consult Amion 09/18/2020, 11:20 AM

## 2020-09-18 NOTE — Procedures (Signed)
Bronchoscopy Procedure Note  Jeremiah [name]  [id number]  [birth year]  Date:09/18/20  Time:12:15 PM   Provider Performing:Marvie Calender C Tamala Julian   Procedure(s):  Flexible bronchoscopy with bronchial alveolar lavage [phone])  Indication(s) RLL collapse on CXR  Consent Risks of the procedure as well as the alternatives and risks of each were explained to the patient and/or caregiver.  Consent for the procedure was obtained and is signed in the bedside chart  Anesthesia Fentanyl 193mg, versed 237m  Time Out Verified patient identification, verified procedure, site/side was marked, verified correct patient position, special equipment/implants available, medications/allergies/relevant history reviewed, required imaging and test results available.   Sterile Technique Usual hand hygiene, masks, gowns, and gloves were used   Procedure Description Bronchoscope advanced through endotracheal tube and into airway.  Airways were examined down to subsegmental level with findings noted below.   Following diagnostic evaluation, BAL(s) performed in RLL with normal saline and return of cloudy fluid  Findings:  - ETT in good position - Clots at end of ETT suctioned - BAL in RLL with suctioning of thick purulent secretions   Complications/Tolerance None; patient tolerated the procedure well. Chest X-ray is not needed post procedure.   EBL Minimal   Specimen(s) BAL RLL

## 2020-09-18 NOTE — Procedures (Signed)
Insertion of Chest Tube Procedure Note  Jeremiah [name]  [id number]  12-29-1945  Date:09/18/20  Time:3:39 PM    Provider Performing: Lorin Glass   Procedure: Pleural Catheter Insertion w/ Imaging Guidance (21224)  Indication(s) Effusion  Consent Risks of the procedure as well as the alternatives and risks of each were explained to the patient and/or caregiver.  Consent for the procedure was obtained and is signed in the bedside chart  Anesthesia Topical only with 1% lidocaine    Time Out Verified patient identification, verified procedure, site/side was marked, verified correct patient position, special equipment/implants available, medications/allergies/relevant history reviewed, required imaging and test results available.   Sterile Technique Maximal sterile technique including full sterile barrier drape, hand hygiene, sterile gown, sterile gloves, mask, hair covering, sterile ultrasound probe cover (if used).   Procedure Description Ultrasound used to identify appropriate pleural anatomy for placement and overlying skin marked. Area of placement cleaned and draped in sterile fashion.  A 14 French pigtail pleural catheter was placed into the right pleural space using Seldinger technique. Appropriate return of fluid was obtained.  The tube was connected to atrium and placed on -20 cm H2O wall suction.  Old bloody fluid obtained (and air), c/w what was coming out a month ago after tpa/dornase administered to space.   Complications/Tolerance None; patient tolerated the procedure well. Chest X-ray is ordered to verify placement.   EBL Minimal  Specimen(s) fluid

## 2020-09-18 NOTE — Progress Notes (Addendum)
   09/18/20 1017  Clinical Encounter Type  Visited With Family  Visit Type Code  Referral From Nurse  Consult/Referral To Chaplain  Chaplain responded to code on 2W04. The Chaplain met with the patient's son on 7M waiting area. He stated the patient's brothers were also coming. The Chaplain offered presence and comfort. This note was prepared by Jeanine Luz, M.Div..  For questions please contact by phone [phone].

## 2020-09-18 NOTE — Progress Notes (Signed)
Patient removed from bipap and placed on 8l salter. Patient tolerating well at this time. RN aware. RT will continue to monitor.

## 2020-09-18 NOTE — TOC Initial Note (Addendum)
Transition of Care [facility]) - Initial/Assessment Note    Patient Details  Name: Jeremiah [name] MRN: [id number] Date of Birth: 1946/10/20  Transition of Care [facility]) CM/SW Contact:    Joanne Chars, LCSW Phone Number: 09/18/2020, 8:58 AM  Clinical Narrative:   CSW met with pt to discuss discharge plan.  Pt awake, able to participate in conversation, acknowledges he has been at [facility] and is OK with returning there once he is discharged from the hospital.  Reports prior to that he was at home with his wife, who recently passed away.  Permission given to speak with son [name].  Pt is not vaccinated for covid.  CSW spoke by phone with son [name], who confirms the above and is in agreement with plan for pt to return to [facility].  Will continue to follow for discharge needs.            CSW also spoke with [place] at [facility] who confirms pt can return at discharge.      Auth request submitted to [facility].   Expected Discharge Plan: Skilled Nursing Facility Barriers to Discharge: Continued Medical Work up   Patient Goals and CMS Choice Patient states their goals for this hospitalization and ongoing recovery are:: "get back" CMS Medicare.gov Compare Post Acute Care list provided to::  (Pt is from [facility] and wants to return to [facility])    Expected Discharge Plan and Services Expected Discharge Plan: [place] Choice: [place] arrangements for the past 2 months: Single Family Home (Pt reports he was home prior to admission to [facility].)                                      Prior Living Arrangements/Services Living arrangements for the past 2 months: [place] (Pt reports he was home prior to admission to [place].) Lives with:: Spouse Patient language and need for interpreter reviewed:: Yes Do you feel safe going back to the place where you live?: Yes      Need for Family Participation in Patient  Care: Yes (Comment)     Criminal Activity/Legal Involvement Pertinent to Current Situation/Hospitalization: No - Comment as needed  Activities of Daily Living Home Assistive Devices/Equipment: Bedside commode/3-in-1, Wheelchair, Oxygen ADL Screening (condition at time of admission) Patient's cognitive ability adequate to safely complete daily activities?: No Is the patient deaf or have difficulty hearing?: No Does the patient have difficulty seeing, even when wearing glasses/contacts?: Yes Does the patient have difficulty concentrating, remembering, or making decisions?: Yes Patient able to express need for assistance with ADLs?: No Does the patient have difficulty dressing or bathing?: Yes Independently performs ADLs?: No Does the patient have difficulty walking or climbing stairs?: Yes Weakness of Legs: Left (r. aka) Weakness of Arms/Hands: None  Permission Sought/Granted Permission sought to share information with : Facility Sport and exercise psychologist, Family Supports Permission granted to share information with : Yes, Verbal Permission Granted  Share Information with NAME: son [name]  Permission granted to share info w AGENCY: [facility]        Emotional Assessment Appearance:: Appears stated age Attitude/Demeanor/Rapport: Engaged Affect (typically observed): Appropriate, Pleasant Orientation: : Oriented to Self, Oriented to Place, Oriented to  Time, Oriented to Situation Alcohol / Substance Use: Not Applicable Psych Involvement: No (comment)  Admission diagnosis:  Acute respiratory failure with hypoxia ([place]) [  J96.01] AKI (acute kidney injury) ([place]) [N17.9] Patient Active Problem List   Diagnosis Date Noted  . CAP (community acquired pneumonia) 08/27/2020  . Pneumonia 08/27/2020  . Acute respiratory failure with hypoxia ([place])   . Chest tube in place   . Pneumonia due to COVID-19 virus 08/09/2020  . Acute metabolic encephalopathy 34/19/6222  . Dysphagia 08/09/2020  .  Weakness   . Acute lower UTI 06/25/2020  . Abdominal pain   . Gangrene ([place]) 06/18/2020  . Post-operative pain 04/21/2019  . Abnormality of gait 03/24/2019  . Hypoglycemia   . Hyperkalemia   . Hyponatremia   . Diabetic peripheral neuropathy ([place])   . Labile blood glucose   . Enteritis due to Clostridium difficile   . Blood glucose abnormal   . Diarrhea   . Benign essential HTN   . Hypoalbuminemia due to protein-calorie malnutrition ([place])   . Labile blood pressure   . 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 Postoperative pain   . Acute blood loss anemia   . Dyslipidemia   . Type 2 diabetes mellitus with peripheral neuropathy (HCC)   . S/P AKA (above knee amputation) ([place]) 11/12/2018  . Unilateral AKA, right ([place])   . Hypertensive crisis   . Diabetes mellitus type 2 in nonobese (HCC)   . Pleural effusion 11/08/2018  . Fever   . Open wound of right foot   . Sepsis ([place]) 11/03/2018  . Altered mental status   . Acute cystitis without hematuria   . Leukocytosis   . Pressure injury of skin 07/08/2018  . Clostridium difficile colitis 07/07/2018  . Hypokalemia 07/07/2018  . HLD (hyperlipidemia) 07/07/2018  . Acute diverticulitis 06/04/2018  . Cholecystitis 05/24/2018  . Cholecystitis with cholelithiasis 05/21/2018  . Acute systolic heart failure ([place]) 03/27/2018  . Diabetes mellitus without complication ([place]) 97/98/9211  . Hypertension 01/30/2018  . Acute cholecystitis 01/30/2018  . AKI (acute kidney injury) ([place]) 01/30/2018  . Normocytic anemia 01/30/2018  . PVD (peripheral vascular disease) ([place]) 09/05/2014   PCP:  Clovia Cuff, MD Pharmacy:   [place], [place] - [address] [place] [address] Phone: [phone] Fax: [phone]     Social Determinants of Health (SDOH) Interventions    Readmission Risk Interventions Readmission Risk Prevention Plan 08/13/2020 07/08/2018 07/08/2018  Transportation  Screening Complete - Complete  Medication Review (RN Care Manager) Referral to Pharmacy - Complete  PCP or Specialist appointment within 3-5 days of discharge Complete - Not Complete  HRI or Home Care Consult Complete (No Data) Complete  SW Recovery Care/Counseling Consult Complete Patient refused Not Complete  Palliative Care Screening Not Applicable - Not Complete  Comments - - (No Data)  Medication Reconcilation (Pharmacy) - - Not Complete  Skilled Nursing Facility Complete - Patient refused  Some recent data might be hidden

## 2020-09-18 NOTE — Progress Notes (Signed)
Notified by telemetry at 1015 that patient's monitor was reading asystole. I immediately went to check on patient, who was found unresponsive and without a pulse. Code blue activated.

## 2020-09-18 NOTE — Progress Notes (Signed)
Pharmacy Antibiotic Note  Jeremiah [name] is a 74 y.o. male admitted on 09/14/2020 with pneumonia.  Pharmacy has been consulted for vancomycin and zosyn dosing.  Plan: Vancomycin 1250 mg IV loading dose Vancomycin 1000 mg IV every 24 hours.  Goal trough 10-15 mcg/mL.  Zosyn 3.375 mg q8h (4 hour infusion)  Height: 5\' 9"  (175.3 cm) Weight: 63 kg (139 lb) IBW/kg (Calculated) : 70.7  Temp (24hrs), Avg:98 F (36.7 C), Min:97.9 F (36.6 C), Max:98 F (36.7 C)  Recent Labs  Lab 09/14/20 1445 09/14/20 1445 09/14/20 2029 09/15/20 0200 09/16/20 0116 09/17/20 0301 09/18/20 0357 09/18/20 1223  WBC 11.5*  --   --  11.2*  --  14.1* 12.7*  --   CREATININE 1.73*   < >  --  1.75* 1.59* 1.49* 1.52* 1.63*  LATICACIDVEN  --   --  1.4  --   --   --   --   --    < > = values in this interval not displayed.    Estimated Creatinine Clearance: 34.4 mL/min (A) (by C-G formula based on SCr of 1.63 mg/dL (H)).    No Known Allergies  Antimicrobials this admission: Cefepime 2 g 09/14/20 >> 09/18/20  Dose adjustments this admission: none  Microbiology results: 10/22 BCx: NGTD x4d 10/26 Respiratory cx: sent 10/26 respiratory fungus cx: sent 10/26 MRSA PCR: sent  Thank you for allowing pharmacy to be a part of this patient's care.  11/26 09/18/2020 2:16 PM

## 2020-09-18 NOTE — Consult Note (Signed)
NAME:  Jeremiah [name], MRN:  [id number], DOB:  [birth year], LOS: 4 ADMISSION DATE:  09/14/2020, CONSULTATION DATE: 10/15/2020 REFERRING MD: [facility], CHIEF COMPLAINT: Status post arrest 10/15/2020  Brief History   74 year old with a very complex past medical history status post arrest on Oct 15, 2020  History of present illness   Jeremiah [name] is a 74 year old male well-known to the pulmonary critical care practice as we had followed him for Covid pneumonia with a right pleural effusion status post chest tube placement thrombolytics with subsequent discharge to skilled nursing facility.  He returned to [facility] 09/14/2020 with increasing shortness of breath and required noninvasive mechanical ventilatory support up until 15-Oct-2020.  He has been treated with antimicrobial therapy.  Once he was taken off noninvasive mechanical ventilatory support and placed on high flow oxygen he subsequently developed a respiratory arrest leading to cardiac arrest and was coded on 10/15/2020 for approximately 10 minutes two rounds of epi and intubation by CNRA on the floor.  Pulmonary critical care was asked to admit take over care while in the ICU.  Past Medical History   Past Medical History:  Diagnosis Date  . Amputee   . Aortic atherosclerosis (HCC)   . Arthritis    "joints; shoulders, knees, hands, back" (05/21/2018)  . Atherosclerosis of coronary artery   . C. difficile diarrhea 04/2018  . Diastolic CHF (HCC)   . Diverticulosis   . High cholesterol   . History of gout   . Hypertension   . IDA (iron deficiency anemia)    from referral Dr Darleene Cleaver  . Internal hemorrhoids   . Peripheral vascular disease (HCC)   . Pneumonia    "couple times" (05/21/2018)  . Sleep apnea    "has mask; won't use" (05/21/2018)  . Type II diabetes mellitus (HCC)      Significant Hospital Events   09/15/2020 admission 10-15-20 respiratory cardiac arrest  Consults:  October 15, 2020 pulmonary critical  care  Procedures:  October 15, 2020 intubation 10/15/2020 central line  Significant Diagnostic Tests:    Micro Data:  Oct 15, 2020 sputum culture  Antimicrobials:  09/14/2020 cefepime  Interim history/subjective:  74 year old male admitted with shortness of breath from nursing home with a history of chronic right-sided pleural effusion with suspected aspiration had been on noninvasive mechanical ventilatory support until the a.m. of 10-15-2020 when he was placed on high flow oxygen.  He did have a potassium of 5.6 is noted.  It was 5.9 the day before.  Following removal noninvasive mechanical ventilatory support he developed what is presumed to be a respiratory arrest leading to cardiac arrest he was coded with return of circulation after epi x2 and intubation by the CRNA.  He is poorly responsive at this time.  Is been transferred to intensive care unit.  Further discussion will be ongoing with family concerning goals of care.  Of note he has recent Covid pneumonia for which he was discharged to a nursing home.  Objective   Blood pressure (!) 148/61, pulse 76, temperature 98 F (36.7 C), temperature source Axillary, resp. rate (!) 22, height 5\' 9"  (1.753 m), weight 63 kg, SpO2 99 %.       No intake or output data in the 24 hours ending 10/15/2020 1120 Filed Weights   09/14/20 1222  Weight: 63 kg    Examination: General: Frail elderly male who is currently intubated poorly responsive HENT: Mild JVD is appreciated endotracheal tube is in place Lungs: Coarse rhonchi throughout right greater than left Cardiovascular: Heart sounds are  distant Abdomen: Protuberant soft positive bowel sounds Extremities: Right above-the-knee amputation with well-healed scar, left foot with dressing on wound Neuro: Currently poorly responsive status post cardiac arrest  Resolved Hospital Problem list     Assessment & Plan:  Vent dependent respiratory failure in the setting of suspected respiratory arrest  leading to cardiac arrest with multiple comorbidities including suspected chronic aspiration, chronic right pleural effusion is required chest tube and lytics in the past.  Currently in the ICU status post arrest. Stat portable chest x-ray Stat fiberoptic bronchoscopy to evaluate patency of airway Possible chest tube insertion Continue antimicrobial therapy for suspected pneumonia Tracheal aspiration for culture Bronchodilator therapy  Hyperkalemia Recent Labs  Lab 09/16/20 0116 09/17/20 0301 09/18/20 0357  K 4.9 5.9* 5.6*   Standard treatment for hyperkalemia once repeat potassium is confirmed.  Chronic failure to thrive CODE STATUS discussion with family  Hyperglycemia Sliding-scale insulin protocol  Peripheral vascular disease status post above-the-knee amputation right and chronic left foot ulcer Wound ostomy care   Renal insufficiency Lab Results  Component Value Date   CREATININE 1.52 (H) 09/18/2020   CREATININE 1.49 (H) 09/17/2020   CREATININE 1.59 (H) 09/16/2020   Monitor renal function Avoid nephrotoxins          Best practice:  Diet: NPO Pain/Anxiety/Delirium protocol (if indicated): As indicated VAP protocol (if indicated): In place DVT prophylaxis: Lovenox GI prophylaxis: PPI Glucose control: Sliding scale insulin protocol Mobility: Bedrest Code Status: Full Family Communication: Son updated at bedside Disposition: Transfer to the intensive care unit 09/18/2020 status post arrest  Labs   CBC: Recent Labs  Lab 09/14/20 1445 09/14/20 1451 09/15/20 0200 09/17/20 0301 09/18/20 0357  WBC 11.5*  --  11.2* 14.1* 12.7*  NEUTROABS 8.7*  --   --  10.5* 10.1*  HGB 7.4* 7.5* 7.2* 8.2* 7.6*  HCT 24.5* 22.0* 23.3* 26.9* 24.8*  MCV 89.4  --  88.9 90.6 90.2  PLT 312  --  277 312 292    Basic Metabolic Panel: Recent Labs  Lab 09/14/20 1445 09/14/20 1445 09/14/20 1451 09/15/20 0200 09/16/20 0116 09/17/20 0301 09/18/20 0357  NA 136   < >  138 138 137 137 139  K 5.1   < > 5.0 4.8 4.9 5.9* 5.6*  CL 102  --   --  103 102 103 103  CO2 24  --   --  28 25 23 25   GLUCOSE 235*  --   --  122* 215* 175* 135*  BUN 48*  --   --  49* 51* 51* 61*  CREATININE 1.73*  --   --  1.75* 1.59* 1.49* 1.52*  CALCIUM 8.4*  --   --  8.4* 8.6* 8.9 9.0   < > = values in this interval not displayed.   GFR: Estimated Creatinine Clearance: 36.9 mL/min (A) (by C-G formula based on SCr of 1.52 mg/dL (H)). Recent Labs  Lab 09/14/20 1445 09/14/20 2029 09/15/20 0200 09/17/20 0301 09/18/20 0357  PROCALCITON  --  1.04  --  0.70 1.36  WBC 11.5*  --  11.2* 14.1* 12.7*  LATICACIDVEN  --  1.4  --   --   --     Liver Function Tests: No results for input(s): AST, ALT, ALKPHOS, BILITOT, PROT, ALBUMIN in the last 168 hours. No results for input(s): LIPASE, AMYLASE in the last 168 hours. No results for input(s): AMMONIA in the last 168 hours.  ABG    Component Value Date/Time   PHART 7.327 (L)  09/17/2020 1115   PCO2ART 54.0 (H) 09/17/2020 1115   PO2ART 158 (H) 09/17/2020 1115   HCO3 27.5 09/17/2020 1115   TCO2 29 09/14/2020 1451   O2SAT 99.1 09/17/2020 1115     Coagulation Profile: No results for input(s): INR, PROTIME in the last 168 hours.  Cardiac Enzymes: No results for input(s): CKTOTAL, CKMB, CKMBINDEX, TROPONINI in the last 168 hours.  HbA1C: Hgb A1c MFr Bld  Date/Time Value Ref Range Status  08/10/2020 12:33 AM 6.0 (H) 4.8 - 5.6 % Final    Comment:    (NOTE) Pre diabetes:          5.7%-6.4%  Diabetes:              >6.4%  Glycemic control for   <7.0% adults with diabetes   06/18/2020 03:59 PM 6.1 (H) 4.8 - 5.6 % Final    Comment:    (NOTE) Pre diabetes:          5.7%-6.4%  Diabetes:              >6.4%  Glycemic control for   <7.0% adults with diabetes     CBG: Recent Labs  Lab 09/17/20 1213 09/17/20 1653 09/17/20 2105 09/18/20 0802 09/18/20 1110  GLUCAP 140* 112* 132* 115* 112*    Review of Systems:    NA  Past Medical History  He,  has a past medical history of Amputee, Aortic atherosclerosis (HCC), Arthritis, Atherosclerosis of coronary artery, C. difficile diarrhea (04/2018), Diastolic CHF (HCC), Diverticulosis, High cholesterol, History of gout, Hypertension, IDA (iron deficiency anemia), Internal hemorrhoids, Peripheral vascular disease (HCC), Pneumonia, Sleep apnea, and Type II diabetes mellitus (HCC).   Surgical History    Past Surgical History:  Procedure Laterality Date  . ABDOMINAL AORTOGRAM W/LOWER EXTREMITY N/A 11/01/2018   Procedure: ABDOMINAL AORTOGRAM W/LOWER EXTREMITY;  Surgeon: Runell Gess, MD;  Location: [facility] INVASIVE CV LAB;  Service: Cardiovascular;  Laterality: N/A;  . ABDOMINAL AORTOGRAM W/LOWER EXTREMITY Left 06/20/2020   Procedure: ABDOMINAL AORTOGRAM W/LOWER EXTREMITY;  Surgeon: Cephus Shelling, MD;  Location: [facility] INVASIVE CV LAB;  Service: Cardiovascular;  Laterality: Left;  . AMPUTATION Right 11/09/2018   Procedure: RIGHT - AMPUTATION ABOVE KNEE;  Surgeon: Maeola Harman, MD;  Location: [facility] OR;  Service: Vascular;  Laterality: Right;  . CATARACT EXTRACTION W/ INTRAOCULAR LENS  IMPLANT, BILATERAL Bilateral   . CHOLECYSTECTOMY  05/21/2018   ATTEMPTED LAPAROSCOPIC CHOLECYSTECTOMY, OPEN DRAINAGE OF GALLBLADDER WITH BIOPSY  . CHOLECYSTECTOMY N/A 05/21/2018   Procedure: ATTEMPTED LAPAROSCOPIC CHOLECYSTECTOMY, OPEN DRAINAGE OF GALLBLADDER WITH BIOPSY;  Surgeon: Griselda Miner, MD;  Location: [facility] OR;  Service: General;  Laterality: N/A;  . COLONOSCOPY W/ POLYPECTOMY    . COLONOSCOPY WITH PROPOFOL N/A 09/16/2018   Procedure: COLONOSCOPY WITH PROPOFOL;  Surgeon: Charlott Rakes, MD;  Location: [facility] ENDOSCOPY;  Service: Endoscopy;  Laterality: N/A;  . 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 FLEXIBLE SIGMOIDOSCOPY N/A 04/20/2020    Procedure: FLEXIBLE SIGMOIDOSCOPY;  Surgeon: Meridee Score Netty Starring., MD;  Location: [facility];  Service: Gastroenterology;  Laterality: N/A;  Fecal Disimpaction  . IR CATHETER TUBE CHANGE  04/14/2018  . IR CHOLANGIOGRAM EXISTING TUBE  03/17/2018  . IR PERC CHOLECYSTOSTOMY  01/31/2018  . IR RADIOLOGIST EVAL & MGMT  03/02/2018  . PERIPHERAL VASCULAR INTERVENTION Left 06/20/2020   Procedure: PERIPHERAL VASCULAR INTERVENTION;  Surgeon: Cephus Shelling, MD;  Location:  [facility] INVASIVE CV LAB;  Service: Cardiovascular;  Laterality: Left;  4 SFA STENTS     Social History   reports that he quit smoking about 36 years ago. His smoking use included cigarettes. He quit after 24.00 years of use. He has never used smokeless tobacco. He reports previous alcohol use. He reports that he does not use drugs.   Family History   His family history includes Diabetes in his brother, mother, and sister; Heart attack in his brother and father; Heart disease in his brother and father; Hypertension in his brother, mother, and sister.   Allergies No Known Allergies   Home Medications  Prior to Admission medications   Medication Sig Start Date End Date Taking? Authorizing Provider  acetaminophen (TYLENOL) 500 MG tablet Take 1,000 mg by mouth in the morning, at noon, and at bedtime.    Yes [provider]  amLODipine (NORVASC) 10 MG tablet Take 10 mg by mouth daily. 07/26/20  Yes [provider]  Ascorbic Acid (VITAMIN C PO) Take 2 tablets by mouth daily.   Yes [provider]  aspirin 81 MG tablet Take 81 mg by mouth daily.   Yes [provider]  atorvastatin (LIPITOR) 20 MG tablet Take 1 tablet (20 mg total) by mouth daily. Patient taking differently: Take 20 mg by mouth at bedtime.  12/06/18  Yes Angiulli, Mcarthur Rossettianiel J, PA-C  brimonidine (ALPHAGAN) 0.2 % ophthalmic solution Place 1 drop into both eyes 2 (two) times daily. 12/06/18  Yes Angiulli, Mcarthur Rossettianiel J, PA-C  clopidogrel (PLAVIX) 75 MG  tablet Take 1 tablet (75 mg total) by mouth daily with breakfast. 06/22/20  Yes Rhetta MuraSamtani, Jai-Gurmukh, MD  ferrous sulfate 325 (65 FE) MG tablet Take 1 tablet (325 mg total) by mouth 2 (two) times daily with a meal. 06/28/20  Yes Zannie CoveJoseph, Preetha, MD  gabapentin (NEURONTIN) 300 MG capsule Take 1 capsule (300 mg total) by mouth 2 (two) times daily. 06/28/20  Yes Zannie CoveJoseph, Preetha, MD  glimepiride (AMARYL) 1 MG tablet Take 1 tablet (1 mg total) by mouth daily with breakfast. 12/06/18  Yes Angiulli, Mcarthur Rossettianiel J, PA-C  hydrALAZINE (APRESOLINE) 100 MG tablet Take 0.5 tablets (50 mg total) by mouth 2 (two) times daily. 08/30/20  Yes Osvaldo ShipperKrishnan, Gokul, MD  metoprolol succinate (TOPROL-XL) 25 MG 24 hr tablet Take 1 tablet (25 mg total) by mouth daily. 08/30/20 09/29/20 Yes Osvaldo ShipperKrishnan, Gokul, MD  pantoprazole (PROTONIX) 40 MG tablet Take 1 tablet (40 mg total) by mouth daily. 08/21/20 09/20/20 Yes Zigmund DanielPowell, A Caldwell Jr., MD  psyllium (METAMUCIL) 58.6 % powder Take 1 packet by mouth daily.   Yes [provider]  timolol (TIMOPTIC) 0.5 % ophthalmic solution Place 1 drop into both eyes 2 (two) times daily. 12/06/18  Yes Angiulli, Mcarthur Rossettianiel J, PA-C  traMADol (ULTRAM) 50 MG tablet Take 50 mg by mouth 2 (two) times daily as needed for moderate pain.   Yes [provider]  vitamin B-12 1000 MCG tablet Take 1 tablet (1,000 mcg total) by mouth daily. 08/21/20 09/20/20 Yes Zigmund DanielPowell, A Caldwell Jr., MD  glucose blood test strip Accu-Chek Aviva Plus test strips  Take 1 strip 3 times a day by miscell. route for 90 days.    [provider]  glucose blood test strip Accu-Chek Aviva Plus test strips  USE AS DIRECTED THREE TIMES DAILY.    [provider]     Critical care time: 45 min     Brett CanalesSteve Sundra Haddix ACNP Acute Care Nurse Practitioner [facility]  Pulmonary/Critical Care Please consult Amion 09/18/2020, 11:20 AM

## 2020-09-18 NOTE — Procedures (Signed)
Central Venous Catheter Insertion Procedure Note  Jeremiah [name]  [id number]  1945/12/09  Date:09/18/20  Time:3:39 PM   Provider Performing:Rhys Lichty Erby Pian   Procedure: Insertion of Non-tunneled Central Venous Catheter(36556) without US guidance  Indication(s) Medication administration  Consent Risks of the procedure as well as the alternatives and risks of each were explained to the patient and/or caregiver.  Consent for the procedure was obtained and is signed in the bedside chart  Anesthesia Topical only with 1% lidocaine   Timeout Verified patient identification, verified procedure, site/side was marked, verified correct patient position, special equipment/implants available, medications/allergies/relevant history reviewed, required imaging and test results available.  Sterile Technique Maximal sterile technique including full sterile barrier drape, hand hygiene, sterile gown, sterile gloves, mask, hair covering, sterile ultrasound probe cover (if used).  Procedure Description Area of catheter insertion was cleaned with chlorhexidine and draped in sterile fashion.  Without real-time ultrasound guidance a central venous catheter was placed into the right subclavian vein. Nonpulsatile blood flow and easy flushing noted in all ports.  The catheter was sutured in place and sterile dressing applied.  Complications/Tolerance None; patient tolerated the procedure well. Chest X-ray is ordered to verify placement for internal jugular or subclavian cannulation.   Chest x-ray is not ordered for femoral cannulation.  EBL Minimal  Specimen(s) None

## 2020-09-19 ENCOUNTER — Inpatient Hospital Stay (HOSPITAL_COMMUNITY): Payer: Medicare Other

## 2020-09-19 DIAGNOSIS — J9601 Acute respiratory failure with hypoxia: Secondary | ICD-10-CM | POA: Diagnosis not present

## 2020-09-19 LAB — PHOSPHORUS
Phosphorus: 2.5 mg/dL (ref 2.5–4.6)
Phosphorus: 2.1 mg/dL — ABNORMAL LOW (ref 2.5–4.6)

## 2020-09-19 LAB — CULTURE, BLOOD (ROUTINE X 2)
Culture: NO GROWTH
Culture: NO GROWTH
Special Requests: ADEQUATE

## 2020-09-19 LAB — DIFFERENTIAL
Abs Immature Granulocytes: 0.11 10*3/uL — ABNORMAL HIGH (ref 0.00–0.07)
Basophils Absolute: 0.1 10*3/uL (ref 0.0–0.1)
Basophils Relative: 1 %
Eosinophils Absolute: 0.3 10*3/uL (ref 0.0–0.5)
Eosinophils Relative: 2 %
Immature Granulocytes: 1 %
Lymphocytes Relative: 13 %
Lymphs Abs: 1.8 10*3/uL (ref 0.7–4.0)
Monocytes Absolute: 1.8 10*3/uL — ABNORMAL HIGH (ref 0.1–1.0)
Monocytes Relative: 13 %
Neutro Abs: 9.8 10*3/uL — ABNORMAL HIGH (ref 1.7–7.7)
Neutrophils Relative %: 70 %

## 2020-09-19 LAB — CBC
HCT: 19.6 % — ABNORMAL LOW (ref 39.0–52.0)
Hemoglobin: 6.1 g/dL — CL (ref 13.0–17.0)
MCH: 27 pg (ref 26.0–34.0)
MCHC: 31.1 g/dL (ref 30.0–36.0)
MCV: 86.7 fL (ref 80.0–100.0)
Platelets: 269 10*3/uL (ref 150–400)
RBC: 2.26 MIL/uL — ABNORMAL LOW (ref 4.22–5.81)
RDW: 16.2 % — ABNORMAL HIGH (ref 11.5–15.5)
WBC: 13.8 10*3/uL — ABNORMAL HIGH (ref 4.0–10.5)
nRBC: 0 % (ref 0.0–0.2)

## 2020-09-19 LAB — GLUCOSE, CAPILLARY
Glucose-Capillary: 128 mg/dL — ABNORMAL HIGH (ref 70–99)
Glucose-Capillary: 119 mg/dL — ABNORMAL HIGH (ref 70–99)
Glucose-Capillary: 174 mg/dL — ABNORMAL HIGH (ref 70–99)
Glucose-Capillary: 195 mg/dL — ABNORMAL HIGH (ref 70–99)
Glucose-Capillary: 195 mg/dL — ABNORMAL HIGH (ref 70–99)
Glucose-Capillary: 215 mg/dL — ABNORMAL HIGH (ref 70–99)

## 2020-09-19 LAB — BASIC METABOLIC PANEL
Anion gap: 14 (ref 5–15)
BUN: 69 mg/dL — ABNORMAL HIGH (ref 8–23)
CO2: 21 mmol/L — ABNORMAL LOW (ref 22–32)
Calcium: 8.8 mg/dL — ABNORMAL LOW (ref 8.9–10.3)
Chloride: 107 mmol/L (ref 98–111)
Creatinine, Ser: 1.88 mg/dL — ABNORMAL HIGH (ref 0.61–1.24)
GFR, Estimated: 37 mL/min — ABNORMAL LOW (ref >60)
Glucose, Bld: 121 mg/dL — ABNORMAL HIGH (ref 70–99)
Potassium: 4.3 mmol/L (ref 3.5–5.1)
Sodium: 142 mmol/L (ref 135–145)

## 2020-09-19 LAB — HEMOGLOBIN AND HEMATOCRIT, BLOOD
HCT: 27.4 % — ABNORMAL LOW (ref 39.0–52.0)
Hemoglobin: 9 g/dL — ABNORMAL LOW (ref 13.0–17.0)

## 2020-09-19 LAB — PREPARE RBC (CROSSMATCH)

## 2020-09-19 LAB — MAGNESIUM
Magnesium: 2.3 mg/dL (ref 1.7–2.4)
Magnesium: 2.2 mg/dL (ref 1.7–2.4)

## 2020-09-19 LAB — MRSA PCR SCREENING: MRSA by PCR: NEGATIVE

## 2020-09-19 MED ORDER — VITAL AF 1.2 CAL PO LIQD
1000.0000 mL | ORAL | Status: DC
  Administered 2020-09-19: 1000 mL

## 2020-09-19 MED ORDER — SODIUM CHLORIDE 0.9% FLUSH
10.0000 mL | Freq: Two times a day (BID) | INTRAVENOUS | Status: DC
  Administered 2020-09-19 – 2020-09-20 (×4): 10 mL
  Administered 2020-09-21: 20 mL
  Administered 2020-09-21 – 2020-09-25 (×4): 10 mL

## 2020-09-19 MED ORDER — VITAL HIGH PROTEIN PO LIQD
1000.0000 mL | ORAL | Status: DC
  Administered 2020-09-19: 1000 mL

## 2020-09-19 MED ORDER — SODIUM CHLORIDE 0.9% FLUSH: 10.0000 mL | INTRAVENOUS | Status: DC | PRN

## 2020-09-19 MED ORDER — SODIUM CHLORIDE 0.9% IV SOLUTION: Freq: Once | INTRAVENOUS | Status: DC

## 2020-09-19 MED ORDER — LACTATED RINGERS IV BOLUS
1000.0000 mL | Freq: Once | INTRAVENOUS | Status: AC
  Administered 2020-09-19: 1000 mL via INTRAVENOUS

## 2020-09-19 MED ORDER — PROSOURCE TF PO LIQD
45.0000 mL | Freq: Two times a day (BID) | ORAL | Status: DC
  Administered 2020-09-19: 45 mL
  Filled 2020-09-19: qty 45

## 2020-09-19 NOTE — Progress Notes (Signed)
Late entry: Pt's chest xray results on 09/18/2020 @ 1355, pt's LIJ central line position was reported from [facility]. Dr. Adaline Sill notified of these results. LIJ central line was removed by nurse. New R subclavian central line place by Dr. Adaline Sill

## 2020-09-19 NOTE — Progress Notes (Signed)
RT note-Attempted to wean, awake and alert but low tidal volumes, placed back to full support.

## 2020-09-19 NOTE — Progress Notes (Signed)
CRITICAL VALUE ALERT  Critical Value: Hemoglobin 6.1  Date & Time Notied: 09/19/20 9798  Provider Notified: Dr. Adaline Sill  Orders Received/Actions taken: transfuse 2 units PRBC's

## 2020-09-19 NOTE — Progress Notes (Signed)
PT Cancellation Note  Patient Details Name: COLLIS THEDE MRN: [id number] DOB: 08/16/1946   Cancelled Treatment:    Reason Eval/Treat Not Completed: Medical issues which prohibited therapy. Pt transferred to 51M from 2W due to PEA. Pt is now intubated and sedated. Spoke with RN. Possible extubation attempts in next few days. PT to follow.   Ilda Foil 09/19/2020, 8:51 AM   Aida Raider, PT  Office # [phone] Pager [phone]

## 2020-09-19 NOTE — Progress Notes (Signed)
Nutrition Follow-up  DOCUMENTATION CODES:   Severe malnutrition in context of acute illness/injury  INTERVENTION:   Tube feeding:  -Vital AF 1.2 @ 65 ml/hr via OG (1560 ml)  Provides: 1872 kcals, 117 grams protein, 1265 ml free water.   NUTRITION DIAGNOSIS:   Severe Malnutrition related to acute illness (COVID PNA, aspiration PNA) as evidenced by moderate fat depletion, severe muscle depletion.  Ongoing  GOAL:   Patient will meet greater than or equal to 90% of their needs  Addressed via TF  MONITOR:   Vent status, Skin, TF tolerance, Weight trends, Labs, I & O's  REASON FOR ASSESSMENT:   Consult Assessment of nutrition requirement/status  ASSESSMENT:   74 year old male with history of peripheral vascular disease s/p right AKI, chronic dCHF, HTN, DM2, COVID-19 pneumonia (diagnosed 9/16) who was recently hospitalized due to aspiration pneumonia and right-sided pleural effusion s/p thoracentesis and discharged to SNF presented with hypoxia.   10/26- cardiac arrest, intubated   Normothermia. No pressors. Chest tube placed given concern for R sided empyema. Xray confirmed OG in stomach. Adjust TF to better meet needs.   Admission weight: 63 kg  Current weight: 63 kg   Patient is currently intubated on ventilator support MV:8.7 L/min Temp (24hrs), Avg:99.3 F (37.4 C), Min:98.2 F (36.8 C), Max:100 F (37.8 C)   UOP: 400 ml x 24 hrs  Chest tube (R): 90 ml x 24 hrs   Medications: SS novolog, Vitamin B12 Labs: Cr 1.88-up from yesterday BUN 69- up from yesterday CBG 112- 239  NUTRITION - FOCUSED PHYSICAL EXAM:    Most Recent Value  Orbital Region Mild depletion  Upper Arm Region Severe depletion  Thoracic and Lumbar Region Unable to assess  Buccal Region Moderate depletion  Temple Region Moderate depletion  Clavicle Bone Region Severe depletion  Clavicle and Acromion Bone Region Severe depletion  Scapular Bone Region Unable to assess  Dorsal Hand Mild  depletion  Patellar Region Unable to assess  Anterior Thigh Region Unable to assess  Posterior Calf Region Unable to assess  Edema (RD Assessment) None  Hair Reviewed  Eyes Unable to assess  Mouth Unable to assess  Skin Reviewed  Nails Reviewed     Diet Order:   Diet Order            Diet NPO time specified  Diet effective now                 EDUCATION NEEDS:   Not appropriate for education at this time  Skin:  Skin Assessment: Skin Integrity Issues: Skin Integrity Issues:: DTI DTI: L heel  Stage II: mid sacrum, buttocks x2  Last BM:  10/27  Height:   Ht Readings from Last 1 Encounters:  09/14/20 5\' 9"  (1.753 m)    Weight:   Wt Readings from Last 1 Encounters:  09/19/20 63 kg    Ideal Body Weight:  68.4 kg (Adj IBW for R AKA)  BMI:  Body mass index is 20.51 kg/m.  Estimated Nutritional Needs:   Kcal:  09/21/20  Protein:  100-125 g  Fluid:  >/= 1.6 L/day  [id number] RD, LDN Clinical Nutrition Pager listed in AMION

## 2020-09-19 NOTE — Consult Note (Addendum)
This is a progress note.   NAME:  Jeremiah [name], MRN:  [id number], DOB:  [birth year], LOS: 5 ADMISSION DATE:  09/14/2020, CONSULTATION DATE: 09-28-2020 REFERRING MD: Triad, CHIEF COMPLAINT: Status post arrest 28-Sep-2020  Brief History   74 year old with a very complex past medical history status post arrest on 09-28-20  History of present illness   Jeremiah [name] is a 74 year old male well-known to the pulmonary critical care practice as we had followed him for Covid pneumonia with a right pleural effusion status post chest tube placement thrombolytics with subsequent discharge to skilled nursing facility.  He returned to [facility] 09/14/2020 with increasing shortness of breath and required noninvasive mechanical ventilatory support up until 09-28-2020.  He has been treated with antimicrobial therapy.  Once he was taken off noninvasive mechanical ventilatory support and placed on high flow oxygen he subsequently developed a respiratory arrest leading to cardiac arrest and was coded on 2020-09-28 for approximately 10 minutes two rounds of epi and intubation by CNRA on the floor.  Pulmonary critical care was asked to admit take over care while in the ICU.  Past Medical History   Past Medical History:  Diagnosis Date  . Amputee   . Aortic atherosclerosis ([place])   . Arthritis    "joints; shoulders, knees, hands, back" (05/21/2018)  . Atherosclerosis of coronary artery   . C. difficile diarrhea 04/2018  . Diastolic CHF ([place])   . Diverticulosis   . High cholesterol   . History of gout   . Hypertension   . IDA (iron deficiency anemia)    from referral Dr Daphene Jaeger  . Internal hemorrhoids   . Peripheral vascular disease ([place])   . Pneumonia    "couple times" (05/21/2018)  . Sleep apnea    "has mask; won't use" (05/21/2018)  . Type II diabetes mellitus ([place])      Significant Hospital Events   09/15/2020 admission 09/28/2020 respiratory cardiac arrest  Consults:  09-28-20  pulmonary critical care  Procedures:  09/28/2020 intubation 09-28-2020 central line  Significant Diagnostic Tests:    Micro Data:  09-28-20 sputum culture  Antimicrobials:  09/14/2020 cefepime  Interim history/subjective:  74 year old male admitted with shortness of breath from nursing home with a history of chronic right-sided pleural effusion with suspected aspiration had been on noninvasive mechanical ventilatory support until the a.m. of 28-Sep-2020 when he was placed on high flow oxygen.  He did have a potassium of 5.6 is noted.  It was 5.9 the day before.  Following removal noninvasive mechanical ventilatory support he developed what is presumed to be a respiratory arrest leading to cardiac arrest he was coded with return of circulation after epi x2 and intubation by the CRNA.  He is poorly responsive at this time.  Is been transferred to intensive care unit.  Further discussion will be ongoing with family concerning goals of care.  Of note he has recent Covid pneumonia for which he was discharged to a nursing home.  Objective   Blood pressure (!) 107/50, pulse 67, temperature 100 F (37.8 C), temperature source Oral, resp. rate 16, height _0  (1.753 m), weight 63 kg, SpO2 100 %.    Vent Mode: PRVC FiO2 (%):  [40 %-100 %] 40 % Set Rate:  [16 bmp] 16 bmp Vt Set:  [500 mL-550 mL] 550 mL PEEP:  [5 cmH20] 5 cmH20 Plateau Pressure:  [21 cmH20-33 cmH20] 26 cmH20   Intake/Output Summary (Last 24 hours) at 09/19/2020 0708 Last data filed at 09/19/2020 0600  Gross per 24 hour  Intake 644.43 ml  Output 490 ml  Net 154.43 ml   Filed Weights   09/14/20 1222 09/19/20 0500  Weight: 63 kg 63 kg    Examination:  Chronically ill-appearing man on ventilator Endotracheal tube with thick secretions Extremities show trace dependent edema, chronic venous stasis and peripheral vascular changes, he has a right BKA Lungs with rhonchi bilaterally, heart sounds regular, systolic murmur,  extremities warm Opens eyes to voice, tracks, cannot get him to follow commands   No fevers Labs today are pending Chest x-ray pending Resolved Hospital Problem list     Assessment & Plan:  Acute hypoxemic and hypercarbic respiratory failure due to persistent ARDS, infectious vs. Aspiration vs. Inflammatory.  Pct 0.7>>1.36 - Budding yeast on BAL stain  - Continue vancomycin and Zosyn for now, follow fever curve, WBC count, cultures  In hospital PEA cardiac arrest in the setting of above suspect related to respiratory issues.  ~10 min CPR.  EKG reassuring. -Maintain normothermia, lighten sedation today to get better neuro exam  WMA in multiple areas, pericardial effusion-possibly in an RCA distribution, EKG reassuring, has no signs of tamponade, not on pressors.  ACS does not fit with clinical hx and EKG.  His hemoglobin is dropping and with the pericardial effusion would hold off on an aggressive ACS work-up until we have dealt with other organ systems.  Acute on chronic right pleural effusion with entrapped lung, status post tube thoracostomy last month as well as pleural thrombolytics with fairly good results.  He now has a left-sided loculated effusion as well as changes concerning for an empyema on the right.  The right side was sampled and looks more consistent with just old blood with the thickened rind likely secondary to the thrombolytics administered.  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 - f/u   Severe peripheral vascular disease status post BKA  Diabetes- normal sugars but NPO, follow with SSI for now  Muscular deconditioning-we will need aggressive physical therapy if we can get him off ventilator  Severe protein calorie malnutrition present on admission.  Begin tube feeds, nutrition consult  CKD with hyperkalemia-status post Lokelma, calcium, awaiting a.m. potassium and adjust as needed.  May need to consider  diuretics.  Best practice:  Diet: TF Pain/Anxiety/Delirium protocol (if indicated): PRN fentanyl VAP protocol (if indicated): In place DVT prophylaxis: Lovenox GI prophylaxis: PPI Glucose control: Sliding scale insulin protocol Mobility: Bedrest Code Status: Full Family Communication: Son updated at bedside 10/26 Disposition: ICU pending vent liberation   Patient critically ill due to respiratory failure, post-cardiac arrest, ARDS Interventions to address this today antibiotics, ventilator support, sedation titration Risk of deterioration without these interventions is high  I personally spent 45 minutes providing critical care not including any separately billable procedures  Erskine Emery MD [facility] 09/19/2020 7:20 AM Personal pager: [phone] If unanswered, please page CCM On-call: [phone]

## 2020-09-20 ENCOUNTER — Inpatient Hospital Stay (HOSPITAL_COMMUNITY): Payer: Medicare Other

## 2020-09-20 DIAGNOSIS — J9601 Acute respiratory failure with hypoxia: Secondary | ICD-10-CM | POA: Diagnosis not present

## 2020-09-20 LAB — CBC WITH DIFFERENTIAL/PLATELET
Abs Immature Granulocytes: 0.08 10*3/uL — ABNORMAL HIGH (ref 0.00–0.07)
Basophils Absolute: 0.1 10*3/uL (ref 0.0–0.1)
Basophils Relative: 0 %
Eosinophils Absolute: 0.6 10*3/uL — ABNORMAL HIGH (ref 0.0–0.5)
Eosinophils Relative: 4 %
HCT: 26.5 % — ABNORMAL LOW (ref 39.0–52.0)
Hemoglobin: 8.6 g/dL — ABNORMAL LOW (ref 13.0–17.0)
Immature Granulocytes: 1 %
Lymphocytes Relative: 9 %
Lymphs Abs: 1.2 10*3/uL (ref 0.7–4.0)
MCH: 27.1 pg (ref 26.0–34.0)
MCHC: 32.5 g/dL (ref 30.0–36.0)
MCV: 83.6 fL (ref 80.0–100.0)
Monocytes Absolute: 1.9 10*3/uL — ABNORMAL HIGH (ref 0.1–1.0)
Monocytes Relative: 14 %
Neutro Abs: 9.6 10*3/uL — ABNORMAL HIGH (ref 1.7–7.7)
Neutrophils Relative %: 72 %
Platelets: 267 10*3/uL (ref 150–400)
RBC: 3.17 MIL/uL — ABNORMAL LOW (ref 4.22–5.81)
RDW: 19.3 % — ABNORMAL HIGH (ref 11.5–15.5)
WBC: 13.4 10*3/uL — ABNORMAL HIGH (ref 4.0–10.5)
nRBC: 0 % (ref 0.0–0.2)

## 2020-09-20 LAB — BASIC METABOLIC PANEL
Anion gap: 10 (ref 5–15)
BUN: 57 mg/dL — ABNORMAL HIGH (ref 8–23)
CO2: 25 mmol/L (ref 22–32)
Calcium: 8.6 mg/dL — ABNORMAL LOW (ref 8.9–10.3)
Chloride: 108 mmol/L (ref 98–111)
Creatinine, Ser: 1.58 mg/dL — ABNORMAL HIGH (ref 0.61–1.24)
GFR, Estimated: 45 mL/min — ABNORMAL LOW (ref >60)
Glucose, Bld: 227 mg/dL — ABNORMAL HIGH (ref 70–99)
Potassium: 3.2 mmol/L — ABNORMAL LOW (ref 3.5–5.1)
Sodium: 143 mmol/L (ref 135–145)

## 2020-09-20 LAB — GLUCOSE, CAPILLARY
Glucose-Capillary: 210 mg/dL — ABNORMAL HIGH (ref 70–99)
Glucose-Capillary: 195 mg/dL — ABNORMAL HIGH (ref 70–99)
Glucose-Capillary: 148 mg/dL — ABNORMAL HIGH (ref 70–99)
Glucose-Capillary: 176 mg/dL — ABNORMAL HIGH (ref 70–99)
Glucose-Capillary: 112 mg/dL — ABNORMAL HIGH (ref 70–99)
Glucose-Capillary: 148 mg/dL — ABNORMAL HIGH (ref 70–99)

## 2020-09-20 LAB — PHOSPHORUS
Phosphorus: 1.5 mg/dL — ABNORMAL LOW (ref 2.5–4.6)
Phosphorus: 5.6 mg/dL — ABNORMAL HIGH (ref 2.5–4.6)
Phosphorus: 4.5 mg/dL (ref 2.5–4.6)

## 2020-09-20 LAB — BPAM RBC
Blood Product Expiration Date: 202111162359
Blood Product Expiration Date: 202111162359
ISSUE DATE / TIME: 202110271210
ISSUE DATE / TIME: 202110271500
Unit Type and Rh: 7300
Unit Type and Rh: 7300

## 2020-09-20 LAB — TYPE AND SCREEN
ABO/RH(D): B POS
Antibody Screen: NEGATIVE
Unit division: 0
Unit division: 0

## 2020-09-20 LAB — CULTURE, RESPIRATORY W GRAM STAIN: Culture: NORMAL

## 2020-09-20 LAB — MAGNESIUM
Magnesium: 2 mg/dL (ref 1.7–2.4)
Magnesium: 1.7 mg/dL (ref 1.7–2.4)

## 2020-09-20 MED ORDER — ENOXAPARIN SODIUM 40 MG/0.4ML ~~LOC~~ SOLN
40.0000 mg | SUBCUTANEOUS | Status: DC
  Administered 2020-09-20 – 2020-09-25 (×6): 40 mg via SUBCUTANEOUS
  Filled 2020-09-20 (×6): qty 0.4

## 2020-09-20 MED ORDER — POTASSIUM PHOSPHATES 15 MMOLE/5ML IV SOLN
45.0000 mmol | Freq: Once | INTRAVENOUS | Status: AC
  Administered 2020-09-20: 45 mmol via INTRAVENOUS
  Filled 2020-09-20: qty 15

## 2020-09-20 MED ORDER — ORAL CARE MOUTH RINSE
15.0000 mL | Freq: Two times a day (BID) | OROMUCOSAL | Status: DC
  Administered 2020-09-20 – 2020-09-25 (×10): 15 mL via OROMUCOSAL

## 2020-09-20 NOTE — Progress Notes (Addendum)
This is a progress note.   NAME:  Jeremiah [name], MRN:  [id number], DOB:  07/13/1946, LOS: 6 ADMISSION DATE:  09/14/2020, CONSULTATION DATE: 2020/09/22 REFERRING MD: Triad, CHIEF COMPLAINT: Status post arrest 2020-09-22  Brief History   74 year old with a very complex past medical history status post arrest on 09/22/20  History of present illness   Mr. Kutzer is a 74 year old male well-known to the pulmonary critical care practice as we had followed him for Covid pneumonia with a right pleural effusion status post chest tube placement thrombolytics with subsequent discharge to skilled nursing facility.  He returned to [facility] 09/14/2020 with increasing shortness of breath and required noninvasive mechanical ventilatory support up until 09-22-20.  He has been treated with antimicrobial therapy.  Once he was taken off noninvasive mechanical ventilatory support and placed on high flow oxygen he subsequently developed a respiratory arrest leading to cardiac arrest and was coded on 09-22-20 for approximately 10 minutes two rounds of epi and intubation by CNRA on the floor.  Pulmonary critical care was asked to admit take over care while in the ICU.  Past Medical History   Past Medical History:  Diagnosis Date  . Amputee   . Aortic atherosclerosis ([place])   . Arthritis    "joints; shoulders, knees, hands, back" (05/21/2018)  . Atherosclerosis of coronary artery   . C. difficile diarrhea 04/2018  . Diastolic CHF ([place])   . Diverticulosis   . High cholesterol   . History of gout   . Hypertension   . IDA (iron deficiency anemia)    from referral Dr Daphene Jaeger  . Internal hemorrhoids   . Peripheral vascular disease ([place])   . Pneumonia    "couple times" (05/21/2018)  . Sleep apnea    "has mask; won't use" (05/21/2018)  . Type II diabetes mellitus ([place])      Significant Hospital Events   09/15/2020 admission 09-22-2020 respiratory cardiac arrest  Consults:  09/22/20  pulmonary critical care  Procedures:  09/22/2020 intubation 2020/09/22 central line  Significant Diagnostic Tests:    Micro Data:  09/22/2020 sputum culture  Antimicrobials:  09/14/2020 cefepime  Interim history/subjective:  No events overnight.  Patient is awake and alert.  He seems to be moving all 4 extremities.  He is not being very cooperative with commands.  Objective   Blood pressure (!) 149/59, pulse 73, temperature 99.3 F (37.4 C), temperature source Oral, resp. rate 16, height _0  (1.753 m), weight 63 kg, SpO2 99 %.    Vent Mode: PRVC FiO2 (%):  [40 %] 40 % Set Rate:  [16 bmp] 16 bmp Vt Set:  [550 mL-560 mL] 560 mL PEEP:  [5 cmH20] 5 cmH20 Plateau Pressure:  [28 QVZ56-38 cmH20] 31 cmH20   Intake/Output Summary (Last 24 hours) at 09/20/2020 0834 Last data filed at 09/20/2020 7564 Gross per 24 hour  Intake 3190.47 ml  Output 500 ml  Net 2690.47 ml   Filed Weights   09/14/20 1222 09/19/20 0500 09/20/20 0500  Weight: 63 kg 63 kg 63 kg    Examination:  Chronically ill-appearing man on ventilator Endotracheal tube in place with moderate thick secretions He has rhonchi worse in the right lower lobe He has a right BKA and a left foot wrapped in gauze without strikethrough Abdomen is soft with hypoactive bowel sounds Heart sounds are regular, he has a systolic murmur, extremities warm And I would try to pull it back need to increase his 30 outtake fairly  Hemoglobin responded  appropriately to 2 units yesterday Platelets normal White count stably elevated MRSA swab negative Creatinine has improved slightly as has BUN Sodium stable Phos slightly low, repleted Blood glucose is a little on the higher side BAL from 10/26 is still pending growth Net 2.6 L positive yesterday, +2.1 L for admission per chart review- His weight is stable from admission at 63 kg  Resolved Hospital Problem list     Assessment & Plan:  Acute hypoxemic and hypercarbic  respiratory failure due to persistent ARDS, infectious vs. Aspiration vs. Inflammatory.  Pct 0.7>>1.36.  MRSA swab neg.  CXR improved just persistent R airspace disease - Continue Zosyn for now, follow fever curve, WBC count, cultures - SBT and potential extubation  In hospital PEA cardiac arrest in the setting of above suspect related to respiratory issues.  ~10 min CPR.  EKG reassuring.  More awake today, will work toward extubation to get better idea of mental status.   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  WMA in multiple areas, pericardial effusion-possibly in an RCA distribution, EKG reassuring, has no signs of tamponade, not on pressors.  ACS does not fit with clinical hx and EKG.  His hemoglobin is dropping and with the pericardial effusion would hold off on an aggressive ACS work-up until we have dealt with other organ systems. - Watch H/H, if stable in AM and if he tolerates extubation consider cardiology consult for LHC prior to DC  Acute on chronic right pleural effusion with entrapped lung, status post tube thoracostomy last month as well as pleural thrombolytics with fairly good results.  He now has a left-sided loculated effusion as well as changes concerning for an empyema on the right.  The right side was sampled and looks more consistent with just old blood with the thickened rind likely secondary to the thrombolytics administered.  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 - f/u pleural fluid cultures - chest tube removed 10/27  AOC anemia- not too much below baseline, 2 units given 10/27  Severe peripheral vascular disease status post BKA  L heel wound- unstagable related to poor mobility and poor circulation.  see photo from 10/23 under "media tab" - Continue local wound care  Diabetes- SSI for now as may get extubation  Muscular deconditioning-we will need aggressive physical therapy if we can get  him off ventilator  Severe protein calorie malnutrition present on admission.  Encourage PO if we can get him extubated.  CKD- BUN/Cr improved, a bit oliguric - K improved - Avoid nephrotoxins, keep even to negative, replete phos  GOC- chronic multiorgan dysfunction languishing after passing of his wife - Palliative medicine consult to help family decide on [place]  Best practice:  Diet: TF on hold for potential extubation Pain/Anxiety/Delirium protocol (if indicated): PRN fentanyl VAP protocol (if indicated): In place DVT prophylaxis: Lovenox GI prophylaxis: PPI Glucose control: Sliding scale insulin protocol Mobility: Bedrest Code Status: Full Family Communication: will update later today Disposition: ICU pending vent liberation   Patient critically ill due to respiratory failure, post-cardiac arrest, ARDS Interventions to address this today antibiotics, ventilator weaning, sedation titration Risk of deterioration without these interventions is high  I personally spent 36 minutes providing critical care not including any separately billable procedures  Erskine Emery MD [place] [facility] 09/20/2020 8:34 AM Personal pager: [phone] If unanswered, please page CCM On-call: [phone]

## 2020-09-20 NOTE — Procedures (Signed)
0 Extubation Procedure Note  Patient Details:   Name: Jeremiah [name] DOB: December 31, 1945 MRN: [id number]   Airway Documentation:  Airway 7.5 mm (Active)  Secured at (cm) 25 cm 09/20/20 0800  Measured From Lips 09/20/20 0800  Secured Location Right 09/20/20 0754  Secured By [employer] 09/20/20 0754  Tube Holder Repositioned Yes 09/20/20 0754  Cuff Pressure (cm H2O) 30 cm H2O 09/19/20 1945  Site Condition Dry 09/20/20 0754   Vent end date: (not recorded) Vent end time: (not recorded)   Evaluation  O2 sats: stable throughout Complications: No apparent complications Patient did tolerate procedure well. Bilateral Breath Sounds: Clear, Diminished   Yes   Patient extubated to 4lnc. Vital signs stable. No complications. RT will continue to monitor.  Ave Filter 09/20/2020, 10:31 AM

## 2020-09-20 NOTE — Progress Notes (Signed)
RT attempted SBT with patient. Patient consistently had low tidal volumes. RT placed patient to full support with previous settings. RT will continue to monitor.

## 2020-09-21 ENCOUNTER — Inpatient Hospital Stay (HOSPITAL_COMMUNITY): Payer: Medicare Other

## 2020-09-21 DIAGNOSIS — Z7189 Other specified counseling: Secondary | ICD-10-CM

## 2020-09-21 DIAGNOSIS — Z515 Encounter for palliative care: Secondary | ICD-10-CM | POA: Diagnosis not present

## 2020-09-21 DIAGNOSIS — E43 Unspecified severe protein-calorie malnutrition: Secondary | ICD-10-CM | POA: Insufficient documentation

## 2020-09-21 DIAGNOSIS — J869 Pyothorax without fistula: Secondary | ICD-10-CM

## 2020-09-21 DIAGNOSIS — J9601 Acute respiratory failure with hypoxia: Secondary | ICD-10-CM | POA: Diagnosis not present

## 2020-09-21 DIAGNOSIS — J189 Pneumonia, unspecified organism: Secondary | ICD-10-CM | POA: Diagnosis not present

## 2020-09-21 LAB — BASIC METABOLIC PANEL
Anion gap: 10 (ref 5–15)
BUN: 34 mg/dL — ABNORMAL HIGH (ref 8–23)
CO2: 30 mmol/L (ref 22–32)
Calcium: 8.5 mg/dL — ABNORMAL LOW (ref 8.9–10.3)
Chloride: 105 mmol/L (ref 98–111)
Creatinine, Ser: 1.32 mg/dL — ABNORMAL HIGH (ref 0.61–1.24)
GFR, Estimated: 56 mL/min — ABNORMAL LOW (ref >60)
Glucose, Bld: 136 mg/dL — ABNORMAL HIGH (ref 70–99)
Potassium: 3.7 mmol/L (ref 3.5–5.1)
Sodium: 145 mmol/L (ref 135–145)

## 2020-09-21 LAB — GLUCOSE, CAPILLARY
Glucose-Capillary: 129 mg/dL — ABNORMAL HIGH (ref 70–99)
Glucose-Capillary: 209 mg/dL — ABNORMAL HIGH (ref 70–99)
Glucose-Capillary: 223 mg/dL — ABNORMAL HIGH (ref 70–99)
Glucose-Capillary: 173 mg/dL — ABNORMAL HIGH (ref 70–99)
Glucose-Capillary: 150 mg/dL — ABNORMAL HIGH (ref 70–99)

## 2020-09-21 MED ORDER — ATORVASTATIN CALCIUM 10 MG PO TABS
20.0000 mg | ORAL_TABLET | Freq: Every day | ORAL | Status: DC
  Administered 2020-09-21 – 2020-09-24 (×4): 20 mg via ORAL
  Filled 2020-09-21 (×4): qty 2

## 2020-09-21 MED ORDER — FUROSEMIDE 10 MG/ML IJ SOLN
40.0000 mg | Freq: Once | INTRAMUSCULAR | Status: AC
  Administered 2020-09-21: 40 mg via INTRAVENOUS
  Filled 2020-09-21: qty 4

## 2020-09-21 MED ORDER — ASPIRIN 81 MG PO CHEW
81.0000 mg | CHEWABLE_TABLET | Freq: Every day | ORAL | Status: DC
  Administered 2020-09-21 – 2020-09-25 (×5): 81 mg via ORAL
  Filled 2020-09-21 (×5): qty 1

## 2020-09-21 MED ORDER — INSULIN ASPART 100 UNIT/ML ~~LOC~~ SOLN
0.0000 [IU] | Freq: Three times a day (TID) | SUBCUTANEOUS | Status: DC
  Administered 2020-09-21: 5 [IU] via SUBCUTANEOUS
  Administered 2020-09-21 – 2020-09-22 (×3): 3 [IU] via SUBCUTANEOUS
  Administered 2020-09-23: 2 [IU] via SUBCUTANEOUS
  Administered 2020-09-23: 3 [IU] via SUBCUTANEOUS
  Administered 2020-09-24: 5 [IU] via SUBCUTANEOUS
  Administered 2020-09-24 – 2020-09-25 (×2): 3 [IU] via SUBCUTANEOUS

## 2020-09-21 MED ORDER — ENSURE ENLIVE PO LIQD
237.0000 mL | Freq: Three times a day (TID) | ORAL | Status: DC
  Administered 2020-09-21 – 2020-09-25 (×9): 237 mL via ORAL

## 2020-09-21 MED ORDER — INSULIN ASPART 100 UNIT/ML ~~LOC~~ SOLN: 0.0000 [IU] | Freq: Every day | SUBCUTANEOUS | Status: DC

## 2020-09-21 MED ORDER — FERROUS SULFATE 220 (44 FE) MG/5ML PO ELIX
220.0000 mg | ORAL_SOLUTION | Freq: Two times a day (BID) | ORAL | Status: DC
  Administered 2020-09-21 – 2020-09-22 (×2): 220 mg via ORAL
  Filled 2020-09-21 (×2): qty 5

## 2020-09-21 MED ORDER — VITAMIN B-12 1000 MCG PO TABS
1000.0000 ug | ORAL_TABLET | Freq: Every day | ORAL | Status: DC
  Administered 2020-09-21 – 2020-09-25 (×5): 1000 ug via ORAL
  Filled 2020-09-21 (×5): qty 1

## 2020-09-21 MED ORDER — GABAPENTIN 250 MG/5ML PO SOLN
300.0000 mg | Freq: Two times a day (BID) | ORAL | Status: DC
  Administered 2020-09-21: 300 mg via ORAL
  Filled 2020-09-21: qty 6

## 2020-09-21 MED ORDER — ACETAMINOPHEN 325 MG PO TABS
650.0000 mg | ORAL_TABLET | Freq: Four times a day (QID) | ORAL | Status: DC | PRN
  Filled 2020-09-21: qty 2

## 2020-09-21 MED ORDER — GABAPENTIN 300 MG PO CAPS
300.0000 mg | ORAL_CAPSULE | Freq: Two times a day (BID) | ORAL | Status: DC
  Administered 2020-09-21 – 2020-09-25 (×8): 300 mg via ORAL
  Filled 2020-09-21 (×8): qty 1

## 2020-09-21 NOTE — Progress Notes (Signed)
Physical Therapy Treatment Patient Details Name: Jeremiah [name] MRN: [id number] DOB: 1946-09-17 Today's Date: 09/21/2020    History of Present Illness 74 y.o. male admitted from [facility] 10/22 with respiratory failure and PNA with empyema. 10/26 pt with respiratory arrest leading to cardiac arrest. Intubated 10/26-10/28. PMhx: recent Covid PNA admission with D/C to SNF 10/7. PVD, Rt AKA, CHF, HTN, DM    PT Comments    Pt pleasant in bed and willing to perform limited mobility. Pt unable to provide significant report of PLOF or how often he was getting up at [facility]. Pt with max assist to get to EOb, max +2 to return to bed and pt denied attempting OOB to chair. Will continue to attempt to progress function and strength as pt able to tolerate.  Pt on 4L throughout session with VSS.    Follow Up Recommendations  SNF;Supervision/Assistance - 24 hour     Equipment Recommendations  None recommended by PT    Recommendations for Other Services       Precautions / Restrictions Precautions Precautions: Fall Precaution Comments: R AKA    Mobility  Bed Mobility Overal bed mobility: Needs Assistance Bed Mobility: Supine to Sit;Sit to Supine     Supine to sit: Max assist;HOB elevated Sit to supine: Max assist;+2 for safety/equipment   General bed mobility comments: max assist to move leg to EOB, roll toward left and elevate trunk from surface. Return to supine pt not providing significant assist. Pt able to scoot toward [facility] with max +2 assist at EOB x 3 trials. pt positioned in chair position in bed end of session  Transfers                 General transfer comment: pt denied lateral scoot from bed to chair  Ambulation/Gait                 Stairs             Wheelchair Mobility    Modified Rankin (Stroke Patients Only)       Balance Overall balance assessment: Needs assistance Sitting-balance support: Bilateral upper extremity supported;Feet  supported Sitting balance-Leahy Scale: Poor Sitting balance - Comments: pt with max -mod assist EOB with multimodal cues for midline, UE support and correction to midline. EOB grossly 6 min Postural control: Posterior lean                                  Cognition Arousal/Alertness: Awake/alert Behavior During Therapy: WFL for tasks assessed/performed Overall Cognitive Status: No family/caregiver present to determine baseline cognitive functioning Area of Impairment: Safety/judgement;Awareness;Problem solving;Orientation;Following commands                 Orientation Level: Time     Following Commands: Follows one step commands inconsistently Safety/Judgement: Decreased awareness of safety;Decreased awareness of deficits Awareness: Emergent Problem Solving: Slow processing;Difficulty sequencing;Requires verbal cues;Requires tactile cues;Decreased initiation General Comments: increased time with lack of awareness for poor balance and self limiting with activity      Exercises      General Comments        Pertinent Vitals/Pain Pain Assessment: No/denies pain    Home Living                      Prior Function            PT Goals (current goals can now be found in  the care plan section) Progress towards PT goals: Progressing toward goals    Frequency    Min 2X/week      PT Plan Current plan remains appropriate    Co-evaluation              AM-PAC PT "6 Clicks" Mobility   Outcome Measure  Help needed turning from your back to your side while in a flat bed without using bedrails?: Total Help needed moving from lying on your back to sitting on the side of a flat bed without using bedrails?: Total Help needed moving to and from a bed to a chair (including a wheelchair)?: Total Help needed standing up from a chair using your arms (e.g., wheelchair or bedside chair)?: Total Help needed to walk in hospital room?: Total Help needed  climbing 3-5 steps with a railing? : Total 6 Click Score: 6    End of Session Equipment Utilized During Treatment: Oxygen Activity Tolerance: Patient limited by fatigue Patient left: in bed;with call bell/phone within reach;with bed alarm set Nurse Communication: Mobility status;Need for lift equipment PT Visit Diagnosis: Other abnormalities of gait and mobility (R26.89);Muscle weakness (generalized) (M62.81)     Time: 6073-7106 PT Time Calculation (min) (ACUTE ONLY): 19 min  Charges:  $Therapeutic Activity: 8-22 mins                     Kushal Saunders P, PT Acute Rehabilitation Services Pager: [phone] Office: [phone]    Hanna Ra B Kyna Blahnik 09/21/2020, 1:33 PM

## 2020-09-21 NOTE — Progress Notes (Signed)
Nutrition Follow-up  DOCUMENTATION CODES:   Severe malnutrition in context of acute illness/injury  INTERVENTION:   Ensure Enlive po TID, each supplement provides 350 kcal and 20 grams of protein  Magic cup TID with meals, each supplement provides 290 kcal and 9 grams of protein   NUTRITION DIAGNOSIS:   Severe Malnutrition related to acute illness (COVID PNA, aspiration PNA) as evidenced by moderate fat depletion, severe muscle depletion.  Ongoing  GOAL:   Patient will meet greater than or equal to 90% of their needs  Progressing  MONITOR:   PO intake, Supplement acceptance  REASON FOR ASSESSMENT:   Consult Assessment of nutrition requirement/status  ASSESSMENT:   74 year old male with history of peripheral vascular disease s/p right AKI, chronic dCHF, HTN, DM2, COVID-19 pneumonia (diagnosed 9/16) who was recently hospitalized due to aspiration pneumonia and right-sided pleural effusion s/p thoracentesis and discharged to SNF presented with hypoxia.   Per Palliative care notes pt and son want full scope of care and that quantity is most important over quality Pt working with PT.   10/26- cardiac arrest, intubated  10/27 chest tube removed 10/28 extubated  Admission weight: 63 kg  Current weight: 63 kg    Medications: ferrous sulfate 220 mg BID, SS novolog, Vitamin B12 Labs reviewed CBG's: 209-223 (pt was NPO)  Diet Order:   Diet Order            DIET SOFT Room service appropriate? Yes; Fluid consistency: Thin  Diet effective now                 EDUCATION NEEDS:   Not appropriate for education at this time  Skin:  Skin Assessment: Skin Integrity Issues: Skin Integrity Issues:: DTI DTI: L heel  Stage II: mid sacrum, buttocks x2  Last BM:  10/29 medium type 5  Height:   Ht Readings from Last 1 Encounters:  09/14/20 5\' 9"  (1.753 m)    Weight:   Wt Readings from Last 1 Encounters:  09/21/20 63 kg    Ideal Body Weight:  68.4 kg (Adj IBW  for R AKA)  BMI:  Body mass index is 20.51 kg/m.  Estimated Nutritional Needs:   Kcal:  1900-2100  Protein:  95-105 grams  Fluid:  >1.9 L/day  09/23/20., RD, LDN, CNSC See AMiON for contact information

## 2020-09-21 NOTE — Progress Notes (Addendum)
Pharmacy Antibiotic Note  EION TIMBROOK is a 74 y.o. male admitted on 09/14/2020 with pneumonia.  Pharmacy has been consulted for Zosyn dosing. Currently afebrile and white count is slightly elevated but stable (13.4). Renal function improving, currently 1.32 and his BL is ~1.1.  Plan: Zosyn 3.375g IV q8h (4 hour infusion). Day 7/14. Stop date in. Monitor WBC, temp, renal function  F/U infectious work-up  Height: 5\' 9"  (175.3 cm) Weight: 63 kg (138 lb 14.2 oz) IBW/kg (Calculated) : 70.7  Temp (24hrs), Avg:98.5 F (36.9 C), Min:98.2 F (36.8 C), Max:99.3 F (37.4 C)  Recent Labs  Lab 09/14/20 1445 09/14/20 2029 09/15/20 0200 09/16/20 0116 09/17/20 0301 09/17/20 0301 09/18/20 0357 09/18/20 1223 09/19/20 0710 09/19/20 0731 09/20/20 0500 09/21/20 0500  WBC   < >  --  11.2*  --  14.1*  --  12.7*  --   --  13.8* 13.4*  --   CREATININE   < >  --  1.75*   < > 1.49*   < > 1.52* 1.63* 1.88*  --  1.58* 1.32*  LATICACIDVEN  --  1.4  --   --   --   --   --   --   --   --   --   --    < > = values in this interval not displayed.    Estimated Creatinine Clearance: 42.4 mL/min (A) (by C-G formula based on SCr of 1.32 mg/dL (H)).    No Known Allergies  Antimicrobials this admission: Vancomycin 1000 mg 10/26 >> 10/28 Cefepime 2g 10/22 >> 10/26  Dose adjustments this admission: none  Microbiology results: 10/22 BCx: NGF 10/26 MRSA PCR: neg 10/26 respiratory cx: rare normal respiratory flora - final 10/26 pleural fluid: NG x2d  Thank you for allowing pharmacy to be a part of this patients care.  11/26 09/21/2020 6:10 AM

## 2020-09-21 NOTE — Progress Notes (Signed)
This is a progress note.   NAME:  Jeremiah [name], MRN:  [id number], DOB:  10/19/1946, LOS: 7 ADMISSION DATE:  09/14/2020, CONSULTATION DATE: 09/18/2020 REFERRING MD: [facility], CHIEF COMPLAINT: Status post arrest 09/18/2020  Brief History   74 yo male resident of [facility] with dyspnea and hypoxia was in hospital from 08/09/20 to 08/21/20 with COVID 19 and aspiration pneumonia complicated by Rt pleural effusion treated with pig tail catheter drainage.  He was then in hospital from 08/26/20 to 08/30/20 with HCAP.  He presented to ER again on 09/14/20 with dyspnea from HCAP and Rt pleural effusion.  Developed respiratory leading to cardiac arrest on 10/26 and PCCM assumed care in ICU.  Past Medical History  DM type 2, HTN, CKD 3b, PAD s/p Rt AKA, Anemia, C diff, Diastolic CHF, Diverticulosis, HLD, Gout, OSA intolerant of CPAP  Significant Hospital Events   10/22 Admit 10/24 worsening hypoxia, needing Bipap 10/25 to IR for u/s assessment of pleural space >> not fluid pocket amenable to thoracentesis 10/26 Respiratory leading to cardiac arrest with ROSC in 10 minutes 10/27 Transfuse 2 units PRBC 10/28 Extubated  Consults:  10/23 Cardiothoracic surgery 10/24 IR  Procedures:  ETT 10/26 >> 10/28 Lt IJ CVL 10/26 >> 10/26 Rt [place] CVL 10/26 >>  Rt pig tail catheter 10/26 >> 10/27  Significant Diagnostic Tests:   CT chest 10/22 >> coronary calcification, no pericardial effusion, persistent subtotal collapse of LLL and total collapse of RML and RLL, complex gas and fluid collection in Rt pleural space with pleural thickening, partially loculated small Lt effusion  Bronchoscopy 10/26 >> removed mucus plug and clots  Echo 10/26 >> EF 40 to 45%, grade 2 DD, mod pericardial effusion w/o tamponade, mod AS  Micro Data:  MRSA PCR 10/22 >> negative COVID 10/22 >> negative Blood 10/22 >> negative Rt pleural fluid 10/26 >>  Sputum 10/26 >> rare budding yeast  Antimicrobials:  Cefepime 10/22 >>  10/26  Vancomycin 10/26 Zosyn 10/26 >>  Interim history/subjective:  Doesn't remember what happened.  Denies chest pain.  Breathing okay.  Objective   Blood pressure (!) 122/51, pulse 66, temperature 98.4 F (36.9 C), temperature source Oral, resp. rate 15, height 5\' 9"  (1.753 m), weight 63 kg, SpO2 97 %.    Vent Mode: PRVC FiO2 (%):  [40 %] 40 % Set Rate:  [16 bmp] 16 bmp Vt Set:  [560 mL] 560 mL PEEP:  [5 cmH20] 5 cmH20 Plateau Pressure:  [31 cmH20] 31 cmH20   Intake/Output Summary (Last 24 hours) at 09/21/2020 0739 Last data filed at 09/21/2020 0600 Gross per 24 hour  Intake 1025.43 ml  Output 2020 ml  Net -994.57 ml   Filed Weights   09/19/20 0500 09/20/20 0500 09/21/20 0500  Weight: 63 kg 63 kg 63 kg    Examination:  General - alert Eyes - pupils reactive ENT - no sinus tenderness, no stridor Cardiac - regular rate/rhythm, no murmur Chest - decreased BS at bases, no wheezing Abdomen - soft, non tender, + bowel sounds Extremities - Rt AKA, decreased muscle bulk Skin - wound dressing on Lt heel clean Neuro - normal strength, moves extremities, follows commands Psych - normal mood and behavior   Resolved Hospital Problem list   Respiratory arrest leading to PEA cardiac arrest, Acute metabolic encephalopathy 2nd to hypoxia  Assessment & Plan:   Acute hypoxic respiratory failure from HCAP and Rt empyema with trapped lung. 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 - status post tube thoracostomy last month as well as pleural thrombolytics with fairly good results.  He now has a left-sided loculated effusion as well as changes concerning for an empyema on the right.  The right side was sampled and looks more consistent with just old blood with the thickened rind likely secondary to the thrombolytics administered.  The etiology of both of these effusions I suspect  is related to his recurrent pneumonitis. - f/u pleural fluid cultures - chest tube removed 10/27  Acute systolic CHF after cardiac arrest. Pericardial effusion without tamponade. Aortic stenosis, CAD, chronic diastolic CHF, PAD, HLD. - continue ASA, lipitor  CKD 3b. - f/u BMET  Anemia of critical illness and chronic disease. - f/u CBC - transfuse for Hb <7 or significant bleeding  DM type 2 poorly controlled with hyperglycemia. 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 - SSI - continue gabapentin  Pressure wounds. - sacral, present on admission - Lt heel, present on admission - wound care  Severe protein calorie malnutrition. - advance diet as tolerated  Deconditioning. - PT/OT  Best practice:  Diet: carb modified DVT prophylaxis: Lovenox GI prophylaxis: not indicated Mobility: OOB to chair Code Status: Full Disposition: telemetry 10/29; to [facility] 10/30 and PCCM will f/u as consult  Labs:   CMP Latest Ref Rng & Units 09/21/2020 09/20/2020 09/19/2020  Glucose 70 - 99 mg/dL 993(T) 701(X) 793(J)  BUN 8 - 23 mg/dL 03(E) 09(Q) 33(A)  Creatinine 0.61 - 1.24 mg/dL 0.76(A) 2.63(F) 3.54(T)  Sodium 135 - 145 mmol/L 145 143 142  Potassium 3.5 - 5.1 mmol/L 3.7 3.2(L) 4.3  Chloride 98 - 111 mmol/L 105 108 107  CO2 22 - 32 mmol/L 30 25 21(L)  Calcium 8.9 - 10.3 mg/dL 6.2(B) 6.3(S) 9.3(T)  Total Protein 6.5 - 8.1 g/dL - - -  Total Bilirubin 0.3 - 1.2 mg/dL - - -  Alkaline Phos 38 - 126 U/L - - -  AST 15 - 41 U/L - - -  ALT 0 - 44 U/L - - -    CBC Latest Ref Rng & Units 09/20/2020 09/19/2020 09/19/2020  WBC 4.0 - 10.5 K/uL 13.4(H) - 13.8(H)  Hemoglobin 13.0 - 17.0 g/dL 3.4(K) 8.7(G) 6.1(LL)  Hematocrit 39 - 52 % 26.5(L) 27.4(L) 19.6(L)  Platelets 150 - 400 K/uL 267 - 269    ABG    Component Value Date/Time   PHART 7.390 09/18/2020 1224   PCO2ART 42.4 09/18/2020 1224   PO2ART 67 (L) 09/18/2020 1224   HCO3 25.7 09/18/2020 1224   TCO2 27 09/18/2020 1224   O2SAT 93.0  09/18/2020 1224    CBG (last 3)  Recent Labs    09/20/20 2310 09/21/20 0307 09/21/20 0705  GLUCAP 148* 129* 209*   Signature:  Coralyn Helling, MD [facility] Pulmonary/Critical Care Pager - [phone] 09/21/2020, 8:10 AM

## 2020-09-21 NOTE — Progress Notes (Signed)
This chaplain responded to PMT-MYF consult to notarize Pt. HCPOA.  The Pt. shared the document was completed earlier today. My son-Brian has the document. The chaplain phoned [name]. The chaplain understands [name] will be present bedside to notarize the document on Monday.

## 2020-09-21 NOTE — Consult Note (Signed)
Palliative Medicine Inpatient Consult Note  Reason for consult:  Goals of Care "Wife recently passed, recurrent admissions, anhedonia, may want hospice, family will need support"  HPI:  Per intensivisit note--> Jeremiah [name] is a 74 year old male well-known to the pulmonary critical care practice as we had followed him for Covid pneumonia with a right pleural effusion status post chest tube placement thrombolytics with subsequent discharge to skilled nursing facility.  He returned to [facility] 09/14/2020 with increasing shortness of breath and required noninvasive mechanical ventilatory support up until 09/23/20.  He has been treated with antimicrobial therapy.  Once he was taken off noninvasive mechanical ventilatory support and placed on high flow oxygen he subsequently developed a respiratory arrest leading to cardiac arrest and was coded on 2020-09-23 for approximately 10 minutes two rounds of epi and intubation by CNRA on the floor.  Pulmonary critical care was asked to admit take over care while in the ICU.  Palliative care was asked to help aid in goals of care conversations and to offer ongoing family support.   It does not appear that Jeremiah [name] has ever been seen by a Palliative provider.   Clinical Assessment/Goals of Care: I have reviewed medical records including EPIC notes, labs and imaging, received report from bedside RN, assessed the patient who was alert and oriented.    I met with Jeremiah [name] and his son Jeremiah [name]  to further discuss diagnosis prognosis, GOC, EOL wishes, disposition and options.   I introduced Palliative Medicine as specialized medical care for people living with serious illness. It focuses on providing relief from the symptoms and stress of a serious illness. The goal is to improve quality of life for both the patient and the family. Jeremiah [name] shares great familiarity with our service as his step mother just died and his mother had a prolonged  hospitalization and is now living in an LTACH on life support.  I asked Dalen to tell me about himself. He shares that he is from [place], [place]. He has lived here throughout his life. He has been married twice. His second wife just recently passed away. He has two sons [place] and [place]. He is a retired Sports coach for [employer]. He is a devout man who practices within the [employer] denomination.  Patient is presently living at [facility] place and is wheelchair bound at baseline. He is able to feed himself though requires assistance with all other bADLs.  A detailed discussion was had today regarding advanced directives - patient has never completed these though would be interested in doing them while he is here.   Concepts specific to code status, artifical feeding and hydration, continued IV antibiotics and rehospitalization was had.  Patient and his son Jeremiah [name] would elect for every intervention possible to maintain life. Even if life was restricted to being a bed, malnourished, with contractures and pressure injuries. They feel that living is better than not irregardless of the quality of that life. Jeremiah [name] shares that his mother was just placed in an LTACH.  I shared with Jeremiah [name] that [place] outlook given his prior medical history of Covid pneumonia, atherosclerosis, diastolic heart failure, peripheral vascular disease, type 2 diabetes, and sleep apnea that I worry his outlook would be quite poor.  Jeremiah [name] states that he would elect everything that could possibly save Jeremiah [name] life or keep him living longer.  He vocalizes that he is very clear about these wishes and would prefer to not be asked the same question over and over  again.  I shared with him that it is part of my job to make sure he understands what ever we do is aligned with what the patient's wishes are for himself.  I was able to asked Jeremiah [name] if he was in agreement with this and he shared that he is.  Discussed the importance of  continued conversation with family and their  medical providers regarding overall plan of care and treatment options, ensuring decisions are within the context of the patients values and GOCs.  Decision Maker: Jeremiah [name] (son) - [phone]  SUMMARY OF RECOMMENDATIONS   Full Code / Full Scope of care  Chaplain Support for Advance Directives  Ongoing palliative care support as needed  Code Status/Advance Care Planning: FULL CODE    Palliative Prophylaxis:   Oral Care, Mobility, ROM, Aspiration precautions  Additional Recommendations (Limitations, Scope, Preferences):  Full scope of care  Psycho-social/Spiritual:   Desire for further Chaplaincy support: Yes - [employer]  Additional Recommendations: Education on advanced care planning   Prognosis: Multiple co-morbidities with a recent PEA arrest. High 12 month mortality likelihood.   Discharge Planning: Discharge back to [facility] place  Review of Systems  Neurological: Positive for weakness.   Oral Intake %:  25% Bowel Movements:  Yes- soft Mobility: Patient is bed-wheelchair bound  Vitals:   09/21/20 0500 09/21/20 0600  BP: 130/64 (!) 122/51  Pulse: 65 66  Resp: (!) 22 15  Temp:    SpO2: 97% 97%    Intake/Output Summary (Last 24 hours) at 09/21/2020 9935 Last data filed at 09/21/2020 0600 Gross per 24 hour  Intake 1105.47 ml  Output 2020 ml  Net -914.53 ml   Last Weight  Most recent update: 09/21/2020  5:13 AM   Weight  63 kg (138 lb 14.2 oz)           Gen:  Elderly AA M in NAD HEENT: Dry mucous membranes, (+) oral thrush CV: Regular rate and rhythm, no murmurs rubs or gallops PULM: clear to auscultation bilaterally  ABD: soft/nontender/nondistended/normal bowel sounds  EXT: BKA RLE Neuro: Alert and oriented x2-3  PPS: 30%   This conversation/these recommendations were discussed with patient primary care team, Dr. Tamala Julian  Time In: 1000 Time Out: 1120 Total Time: 80 Greater than 50%  of this  time was spent counseling and coordinating care related to the above assessment and plan.  [place] Team Team Cell Phone: [phone] Please utilize secure chat with additional questions, if there is no response within 30 minutes please call the above phone number  Palliative Medicine Team providers are available by phone from 7am to 7pm daily and can be reached through the team cell phone.  Should this patient require assistance outside of these hours, please call the patient's attending physician.

## 2020-09-21 NOTE — Progress Notes (Signed)
Patient dropped his sats to 70s, placed on NRB, CCM called, STAT chest XRAY ordered. Lung sounds on right side very diminished. Xray called to come ASAP. WIll continue to monitor closely. Tamsen Meek, RN 09/21/2020 6:19 PM

## 2020-09-21 NOTE — Progress Notes (Signed)
eLink Physician-Brief Progress Note Patient Name: Jeremiah [name] DOB: 1945/11/30 MRN: [id number]   Date of Service  09/21/2020  HPI/Events of Note  Patient was extubated earlier today  So i'm checking up on him.  eICU Interventions  Patient is asleep. He appears comfortable on nasal cannula O2, saturation 99 %.        Edon Hoadley U Sajid Ruppert 09/21/2020, 1:22 AM

## 2020-09-22 DIAGNOSIS — Z515 Encounter for palliative care: Secondary | ICD-10-CM | POA: Diagnosis not present

## 2020-09-22 DIAGNOSIS — J9601 Acute respiratory failure with hypoxia: Secondary | ICD-10-CM | POA: Diagnosis not present

## 2020-09-22 DIAGNOSIS — Z7189 Other specified counseling: Secondary | ICD-10-CM | POA: Diagnosis not present

## 2020-09-22 DIAGNOSIS — J869 Pyothorax without fistula: Secondary | ICD-10-CM | POA: Diagnosis not present

## 2020-09-22 LAB — BASIC METABOLIC PANEL
Anion gap: 14 (ref 5–15)
BUN: 25 mg/dL — ABNORMAL HIGH (ref 8–23)
CO2: 29 mmol/L (ref 22–32)
Calcium: 8.6 mg/dL — ABNORMAL LOW (ref 8.9–10.3)
Chloride: 100 mmol/L (ref 98–111)
Creatinine, Ser: 1.19 mg/dL (ref 0.61–1.24)
GFR, Estimated: >60 mL/min (ref >60)
Glucose, Bld: 129 mg/dL — ABNORMAL HIGH (ref 70–99)
Potassium: 3.5 mmol/L (ref 3.5–5.1)
Sodium: 143 mmol/L (ref 135–145)

## 2020-09-22 LAB — CBC
HCT: 30.2 % — ABNORMAL LOW (ref 39.0–52.0)
Hemoglobin: 9.5 g/dL — ABNORMAL LOW (ref 13.0–17.0)
MCH: 27.1 pg (ref 26.0–34.0)
MCHC: 31.5 g/dL (ref 30.0–36.0)
MCV: 86 fL (ref 80.0–100.0)
Platelets: 241 10*3/uL (ref 150–400)
RBC: 3.51 MIL/uL — ABNORMAL LOW (ref 4.22–5.81)
RDW: 19 % — ABNORMAL HIGH (ref 11.5–15.5)
WBC: 11.8 10*3/uL — ABNORMAL HIGH (ref 4.0–10.5)
nRBC: 0 % (ref 0.0–0.2)

## 2020-09-22 LAB — GLUCOSE, CAPILLARY
Glucose-Capillary: 174 mg/dL — ABNORMAL HIGH (ref 70–99)
Glucose-Capillary: 179 mg/dL — ABNORMAL HIGH (ref 70–99)
Glucose-Capillary: 164 mg/dL — ABNORMAL HIGH (ref 70–99)

## 2020-09-22 MED ORDER — FERROUS SULFATE 325 (65 FE) MG PO TABS
325.0000 mg | ORAL_TABLET | Freq: Two times a day (BID) | ORAL | Status: DC
  Administered 2020-09-22 – 2020-09-25 (×6): 325 mg via ORAL
  Filled 2020-09-22 (×7): qty 1

## 2020-09-22 MED ORDER — AMLODIPINE BESYLATE 10 MG PO TABS
10.0000 mg | ORAL_TABLET | Freq: Every day | ORAL | Status: DC
  Administered 2020-09-22 – 2020-09-25 (×4): 10 mg via ORAL
  Filled 2020-09-22 (×4): qty 1

## 2020-09-22 MED ORDER — NYSTATIN 100000 UNIT/ML MT SUSP
5.0000 mL | Freq: Four times a day (QID) | OROMUCOSAL | Status: DC
  Administered 2020-09-23 – 2020-09-25 (×9): 500000 [IU] via ORAL
  Filled 2020-09-22 (×9): qty 5

## 2020-09-22 MED ORDER — GABAPENTIN 300 MG PO CAPS: 300.0000 mg | ORAL_CAPSULE | Freq: Two times a day (BID) | ORAL | Status: DC

## 2020-09-22 MED ORDER — INSULIN GLARGINE 100 UNIT/ML ~~LOC~~ SOLN
10.0000 [IU] | Freq: Every day | SUBCUTANEOUS | Status: DC
  Administered 2020-09-22 – 2020-09-25 (×4): 10 [IU] via SUBCUTANEOUS
  Filled 2020-09-22 (×4): qty 0.1

## 2020-09-22 MED ORDER — ASPIRIN EC 81 MG PO TBEC: 81.0000 mg | DELAYED_RELEASE_TABLET | Freq: Every day | ORAL | Status: DC

## 2020-09-22 MED ORDER — FUROSEMIDE 10 MG/ML IJ SOLN
40.0000 mg | Freq: Once | INTRAMUSCULAR | Status: AC
  Administered 2020-09-22: 40 mg via INTRAVENOUS
  Filled 2020-09-22: qty 4

## 2020-09-22 MED ORDER — METOPROLOL SUCCINATE ER 25 MG PO TB24
25.0000 mg | ORAL_TABLET | Freq: Every day | ORAL | Status: DC
  Administered 2020-09-22 – 2020-09-25 (×4): 25 mg via ORAL
  Filled 2020-09-22 (×4): qty 1

## 2020-09-22 MED ORDER — ATORVASTATIN CALCIUM 10 MG PO TABS: 20.0000 mg | ORAL_TABLET | Freq: Every day | ORAL | Status: DC

## 2020-09-22 NOTE — Progress Notes (Signed)
This is a progress note.   NAME:  Jeremiah [name], MRN:  [id number], DOB:  [birth year], LOS: 8 ADMISSION DATE:  09/14/2020, CONSULTATION DATE: 09/18/2020 REFERRING MD: [facility], CHIEF COMPLAINT: Status post arrest 09/18/2020  Brief History   74 yo male resident of [facility] with dyspnea and hypoxia was in hospital from 08/09/20 to 08/21/20 with COVID 19 and aspiration pneumonia complicated by Rt pleural effusion treated with pig tail catheter drainage.  He was then in hospital from 08/26/20 to 08/30/20 with HCAP.  He presented to ER again on 09/14/20 with dyspnea from HCAP and Rt pleural effusion.  Developed respiratory leading to cardiac arrest on 10/26 and PCCM assumed care in ICU.  Past Medical History  DM type 2, HTN, CKD 3b, PAD s/p Rt AKA, Anemia, C diff, Diastolic CHF, Diverticulosis, HLD, Gout, OSA intolerant of CPAP  Significant Hospital Events   10/22 Admit 10/24 worsening hypoxia, needing Bipap 10/25 to IR for u/s assessment of pleural space >> not fluid pocket amenable to thoracentesis 10/26 Respiratory leading to cardiac arrest with ROSC in 10 minutes 10/27 Transfuse 2 units PRBC 10/28 Extubated  Consults:  10/23 Cardiothoracic surgery 10/24 IR  Procedures:  ETT 10/26 >> 10/28 Lt IJ CVL 10/26 >> 10/26 Rt [place] CVL 10/26 >>  Rt pig tail catheter 10/26 >> 10/27  Significant Diagnostic Tests:   CT chest 10/22 >> coronary calcification, no pericardial effusion, persistent subtotal collapse of LLL and total collapse of RML and RLL, complex gas and fluid collection in Rt pleural space with pleural thickening, partially loculated small Lt effusion  Bronchoscopy 10/26 >> removed mucus plug and clots  Echo 10/26 >> EF 40 to 45%, grade 2 DD, mod pericardial effusion w/o tamponade, mod AS  Micro Data:  MRSA PCR 10/22 >> negative COVID 10/22 >> negative Blood 10/22 >> negative Rt pleural fluid 10/26 >>  Sputum 10/26 >> rare budding yeast  Antimicrobials:  Cefepime 10/22 >>  10/26  Vancomycin 10/26 Zosyn 10/26 >>  Interim history/subjective:  Increased O2 needs last night.  Improved after dose of lasix.  Objective   Blood pressure (!) 154/66, pulse (!) 58, temperature 98.4 F (36.9 C), temperature source Oral, resp. rate 18, height 5\' 9"  (1.753 m), weight 56.6 kg, SpO2 99 %.        Intake/Output Summary (Last 24 hours) at 09/22/2020 0951 Last data filed at 09/22/2020 09/24/2020 Gross per 24 hour  Intake 426.29 ml  Output 2100 ml  Net -1673.71 ml   Filed Weights   09/20/20 0500 09/21/20 0500 09/22/20 0500  Weight: 63 kg 63 kg 56.6 kg    Examination:  General - alert Eyes - pupils reactive ENT - no sinus tenderness, no stridor Cardiac - regular rate/rhythm, no murmur Chest - decreased BS at bases Abdomen - soft, non tender, + bowel sounds Extremities - Rt AKA Skin - no rashes Neuro - follows commands  Resolved Hospital Problem list   Respiratory arrest leading to PEA cardiac arrest, Acute metabolic encephalopathy 2nd to hypoxia  Assessment & Plan:   Acute hypoxic respiratory failure from HCAP and Rt empyema with trapped lung. 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 - status post tube thoracostomy last month as well as pleural thrombolytics with fairly good results.  He now has a left-sided loculated effusion as well as changes concerning for an empyema on the right.  The right side was sampled and  looks more consistent with just old blood with the thickened rind likely secondary to the thrombolytics administered.  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 - f/u pleural fluid cultures - chest tube removed 10/27  Acute systolic CHF after cardiac arrest. Pericardial effusion without tamponade. Aortic stenosis, CAD, chronic diastolic CHF, PAD, HLD. CKD 3b. Anemia of critical illness and chronic disease. DM type 2  poorly controlled with hyperglycemia. 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 Pressure wounds. Severe protein calorie malnutrition. Deconditioning. - per primary team  Best practice:  Diet: carb modified DVT prophylaxis: Lovenox GI prophylaxis: not indicated Mobility: OOB to chair Code Status: Full Disposition: [facility] as primary service and PCCM will f/u as consult  Labs:   CMP Latest Ref Rng & Units 09/22/2020 09/21/2020 09/20/2020  Glucose 70 - 99 mg/dL 660(Y) 301(S) 010(X)  BUN 8 - 23 mg/dL 32(T) 55(D) 32(K)  Creatinine 0.61 - 1.24 mg/dL 0.25 4.27(C) 6.23(J)  Sodium 135 - 145 mmol/L 143 145 143  Potassium 3.5 - 5.1 mmol/L 3.5 3.7 3.2(L)  Chloride 98 - 111 mmol/L 100 105 108  CO2 22 - 32 mmol/L 29 30 25   Calcium 8.9 - 10.3 mg/dL ) 6.2(G) 3.1(D)  Total Protein 6.5 - 8.1 g/dL - - -  Total Bilirubin 0.3 - 1.2 mg/dL - - -  Alkaline Phos 38 - 126 U/L - - -  AST 15 - 41 U/L - - -  ALT 0 - 44 U/L - - -    CBC Latest Ref Rng & Units 09/22/2020 09/20/2020 09/19/2020  WBC 4.0 - 10.5 K/uL 11.8(H) 13.4(H) -  Hemoglobin 13.0 - 17.0 g/dL 09/21/2020) 1.6(W) 7.3(X)  Hematocrit 39 - 52 % 30.2(L) 26.5(L) 27.4(L)  Platelets 150 - 400 K/uL 241 267 -    ABG    Component Value Date/Time   PHART 7.390 09/18/2020 1224   PCO2ART 42.4 09/18/2020 1224   PO2ART 67 (L) 09/18/2020 1224   HCO3 25.7 09/18/2020 1224   TCO2 27 09/18/2020 1224   O2SAT 93.0 09/18/2020 1224    CBG (last 3)  Recent Labs    09/21/20 1103 09/21/20 1729 09/21/20 2139  GLUCAP 223* 173* 150*   Signature:  [id number], MD [facility] Pulmonary/Critical Care Pager - [phone] 09/22/2020, 9:51 AM

## 2020-09-22 NOTE — Progress Notes (Addendum)
Palliative Medicine Inpatient Follow Up Note Reason for consult:  Goals of Care "Wife recently passed, recurrent admissions, anhedonia, may want hospice, family will need support"  HPI:  Per intensivisit note--> Jeremiah [name] is a 74 year old male well-known to the pulmonary critical care practice as we had followed him for Covid pneumonia with a right pleural effusion status post chest tube placement thrombolytics with subsequent discharge to skilled nursing facility. He returned to [facility] 09/14/2020 with increasing shortness of breath and required noninvasive mechanical ventilatory support up until 09/30/2020. He has been treated with antimicrobial therapy. Once he was taken off noninvasive mechanical ventilatory support and placed on high flow oxygen he subsequently developed a respiratory arrest leading to cardiac arrest and was coded on 2020/09/30 for approximately 10 minutes two rounds of epi and intubation by CNRA on the floor. Pulmonary critical care was asked to admit take over care while in the ICU.  Palliative care was asked to help aid in goals of care conversations and to offer ongoing family support.   Today's Discussion (09/22/2020): Chart reviewed. I spoke to patients bedside RN, Jeremiah [name]. She is concerned as patient has had some incidents of desaturations with ingestion. Concern for silent aspiration for which an MBSS was ordered.   I asked Jeremiah [name] is he remembered meeting me yesterday which he did not. I shared that I met with both he and his son, Jeremiah [name]. He again stated not recalling this.   Jeremiah [name] asked where Jeremiah [name] is - he said his son Jeremiah [name] was in [place] and he is awaiting Jeremiah [name] to come to bring him his teeth. Other than not being able to eat foods he enjoys due to no teeth Jeremiah [name] expresses feeling overall fairly well today.   Discussed the importance of continued conversation with family and their  medical providers regarding overall plan of care and  treatment options, ensuring decisions are within the context of the patients values and GOCs.  Questions and concerns addressed   Objective Assessment: Vital Signs Vitals:   09/22/20 1000 09/22/20 1100  BP:    Pulse: 66 63  Resp: 18 19  Temp:    SpO2: 96% 97%    Intake/Output Summary (Last 24 hours) at 09/22/2020 1401 Last data filed at 09/22/2020 0800 Gross per 24 hour  Intake 43.36 ml  Output 2700 ml  Net -2656.64 ml   Last Weight  Most recent update: 09/22/2020  5:32 AM   Weight  56.6 kg (124 lb 12.5 oz)           Gen:  Elderly AA M in NAD HEENT: Dry mucous membranes, (+) oral thrush CV: Regular rate and rhythm, no murmurs rubs or gallops PULM: clear to auscultation bilaterally  ABD: soft/nontender/nondistended/normal bowel sounds  EXT: BKA RLE Neuro: Alert and oriented x2-3  Decision Maker: Jeremiah [name] (son) - [phone]  SUMMARY OF RECOMMENDATIONS Full Code / Full Scope of care  Chaplain Support for Advance Directives  Goals are clear and it is evident that the patients son wants all aggressive measures pursued to sustain life. Palliative care team will sign off please call for re-consultation if needed.  Time Spent: 25 Greater than 50% of the time was spent in counseling and coordination of care ______________________________________________________________________________________ [place] Team Team Cell Phone: [phone] Please utilize secure chat with additional questions, if there is no response within 30 minutes please call the above phone number  Palliative Medicine Team providers are available by phone from 7am to 7pm  daily and can be reached through the team cell phone.  Should this patient require assistance outside of these hours, please call the patient's attending physician.

## 2020-09-22 NOTE — Consult Note (Signed)
Referring Physician: Sharyn Lull, MD/Pahwani, MD  Jeremiah [name] is an 74 y.o. male.                       Chief Complaint: Congestive heart failure  HPI: 74 years old black male with PMH of diastolic CHF, HTN, COVID pneumonia, Bacterial pneumonia, Hyperlipidemia,PVD, S/P right AKA, type 2 DM and arthritis had acute on chronic respiratory failure, bilateral pleural effusion, drained and now has recurrence.  He had EKG changes of infero-lateral ischemia. His BNP was elevated at 1634.6 pg. HS-Troponin I was minimally elevated on 09/14/2020.  Echocardiogram on 10/26 showed 40-45 % EF with grade II diastolic dysfunction. He also has moderate AS and moderate pericardial effusion. Echocardiogram 3 months ago showed EF of 70-75 %  CXR: positive for multifocal pneumonia, pleural effusions and right empyema. He is feeling better today.  Past Medical History:  Diagnosis Date  . Amputee   . Aortic atherosclerosis (HCC)   . Arthritis    "joints; shoulders, knees, hands, back" (05/21/2018)  . Atherosclerosis of coronary artery   . C. difficile diarrhea 04/2018  . Diastolic CHF (HCC)   . Diverticulosis   . High cholesterol   . History of gout   . Hypertension   . IDA (iron deficiency anemia)    from referral Dr Darleene Cleaver  . Internal hemorrhoids   . Peripheral vascular disease (HCC)   . Pneumonia    "couple times" (05/21/2018)  . Sleep apnea    "has mask; won't use" (05/21/2018)  . Type II diabetes mellitus (HCC)       Past Surgical History:  Procedure Laterality Date  . ABDOMINAL AORTOGRAM W/LOWER EXTREMITY N/A 11/01/2018   Procedure: ABDOMINAL AORTOGRAM W/LOWER EXTREMITY;  Surgeon: Runell Gess, MD;  Location: [facility] INVASIVE CV LAB;  Service: Cardiovascular;  Laterality: N/A;  . ABDOMINAL AORTOGRAM W/LOWER EXTREMITY Left 06/20/2020   Procedure: ABDOMINAL AORTOGRAM W/LOWER EXTREMITY;  Surgeon: Cephus Shelling, MD;  Location: [facility] INVASIVE CV LAB;  Service: Cardiovascular;  Laterality: Left;  .  AMPUTATION Right 11/09/2018   Procedure: RIGHT - AMPUTATION ABOVE KNEE;  Surgeon: Maeola Harman, MD;  Location: [facility];  Service: Vascular;  Laterality: Right;  . CATARACT EXTRACTION W/ INTRAOCULAR LENS  IMPLANT, BILATERAL Bilateral   . CHOLECYSTECTOMY  05/21/2018   ATTEMPTED LAPAROSCOPIC CHOLECYSTECTOMY, OPEN DRAINAGE OF GALLBLADDER WITH BIOPSY  . CHOLECYSTECTOMY N/A 05/21/2018   Procedure: ATTEMPTED LAPAROSCOPIC CHOLECYSTECTOMY, OPEN DRAINAGE OF GALLBLADDER WITH BIOPSY;  Surgeon: Griselda Miner, MD;  Location: [facility] OR;  Service: General;  Laterality: N/A;  . COLONOSCOPY W/ POLYPECTOMY    . COLONOSCOPY WITH PROPOFOL N/A 09/16/2018   Procedure: COLONOSCOPY WITH PROPOFOL;  Surgeon: Charlott Rakes, MD;  Location: [facility] ENDOSCOPY;  Service: Endoscopy;  Laterality: N/A;  . 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 FLEXIBLE SIGMOIDOSCOPY N/A 04/20/2020   Procedure: FLEXIBLE SIGMOIDOSCOPY;  Surgeon: Meridee Score Netty Starring., MD;  Location: [facility];  Service: Gastroenterology;  Laterality: N/A;  Fecal Disimpaction  . IR CATHETER TUBE CHANGE  04/14/2018  . IR CHOLANGIOGRAM EXISTING TUBE  03/17/2018  . IR PERC CHOLECYSTOSTOMY  01/31/2018  . IR RADIOLOGIST EVAL & MGMT  03/02/2018  . PERIPHERAL VASCULAR INTERVENTION Left 06/20/2020   Procedure: PERIPHERAL VASCULAR INTERVENTION;  Surgeon: Cephus Shelling, MD;  Location: [facility] INVASIVE CV LAB;  Service: Cardiovascular;  Laterality: Left;  4 SFA STENTS  Family History  Problem Relation Age of Onset  . Diabetes Mother   . Hypertension Mother   . Heart disease Father   . Heart attack Father   . Diabetes Sister   . Hypertension Sister   . Diabetes Brother   . Heart disease Brother   . Hypertension Brother   . Heart attack Brother    Social History:  reports that he quit smoking about 36 years ago. His smoking use  included cigarettes. He quit after 24.00 years of use. He has never used smokeless tobacco. He reports previous alcohol use. He reports that he does not use drugs.  Allergies: No Known Allergies  Medications Prior to Admission  Medication Sig Dispense Refill  . acetaminophen (TYLENOL) 500 MG tablet Take 1,000 mg by mouth in the morning, at noon, and at bedtime.     Marland Kitchen amLODipine (NORVASC) 10 MG tablet Take 10 mg by mouth daily.    . Ascorbic Acid (VITAMIN C PO) Take 2 tablets by mouth daily.    Marland Kitchen aspirin 81 MG tablet Take 81 mg by mouth daily.    Marland Kitchen atorvastatin (LIPITOR) 20 MG tablet Take 1 tablet (20 mg total) by mouth daily. (Patient taking differently: Take 20 mg by mouth at bedtime. ) 30 tablet 1  . brimonidine (ALPHAGAN) 0.2 % ophthalmic solution Place 1 drop into both eyes 2 (two) times daily. 5 mL 12  . clopidogrel (PLAVIX) 75 MG tablet Take 1 tablet (75 mg total) by mouth daily with breakfast. 30 tablet 12  . ferrous sulfate 325 (65 FE) MG tablet Take 1 tablet (325 mg total) by mouth 2 (two) times daily with a meal. 60 tablet 1  . gabapentin (NEURONTIN) 300 MG capsule Take 1 capsule (300 mg total) by mouth 2 (two) times daily. 60 capsule 0  . glimepiride (AMARYL) 1 MG tablet Take 1 tablet (1 mg total) by mouth daily with breakfast. 30 tablet 1  . hydrALAZINE (APRESOLINE) 100 MG tablet Take 0.5 tablets (50 mg total) by mouth 2 (two) times daily.    . metoprolol succinate (TOPROL-XL) 25 MG 24 hr tablet Take 1 tablet (25 mg total) by mouth daily. 30 tablet 0  . pantoprazole (PROTONIX) 40 MG tablet Take 1 tablet (40 mg total) by mouth daily. 30 tablet 0  . psyllium (METAMUCIL) 58.6 % powder Take 1 packet by mouth daily.    . timolol (TIMOPTIC) 0.5 % ophthalmic solution Place 1 drop into both eyes 2 (two) times daily. 10 mL 6  . traMADol (ULTRAM) 50 MG tablet Take 50 mg by mouth 2 (two) times daily as needed for moderate pain.    . [EXPIRED] vitamin B-12 1000 MCG tablet Take 1 tablet (1,000  mcg total) by mouth daily. 30 tablet 0  . glucose blood test strip Accu-Chek Aviva Plus test strips  Take 1 strip 3 times a day by miscell. route for 90 days.    Marland Kitchen glucose blood test strip Accu-Chek Aviva Plus test strips  USE AS DIRECTED THREE TIMES DAILY.      Results for orders placed or performed during the hospital encounter of 09/14/20 (from the past 48 hour(s))  Glucose, capillary     Status: Abnormal   Collection Time: 09/20/20  3:17 PM  Result Value Ref Range   Glucose-Capillary 176 (H) 70 - 99 mg/dL    Comment: Glucose reference range applies only to samples taken after fasting for at least 8 hours.  Magnesium     Status: None  Collection Time: 09/20/20  5:00 PM  Result Value Ref Range   Magnesium 1.7 1.7 - 2.4 mg/dL    Comment: Performed at [facility], [address], [place], [place] [address]  Phosphorus     Status: Abnormal   Collection Time: 09/20/20  5:00 PM  Result Value Ref Range   Phosphorus 5.6 (H) 2.5 - 4.6 mg/dL    Comment: Performed at [facility], [address] [place], [place] [address]  Glucose, capillary     Status: Abnormal   Collection Time: 09/20/20  7:01 PM  Result Value Ref Range   Glucose-Capillary 112 (H) 70 - 99 mg/dL    Comment: Glucose reference range applies only to samples taken after fasting for at least 8 hours.  phosphorus level     Status: None   Collection Time: 09/20/20  7:55 PM  Result Value Ref Range   Phosphorus 4.5 2.5 - 4.6 mg/dL    Comment: Performed at [facility], [address] [place], [place] [address]  Glucose, capillary     Status: Abnormal   Collection Time: 09/20/20 11:10 PM  Result Value Ref Range   Glucose-Capillary 148 (H) 70 - 99 mg/dL    Comment: Glucose reference range applies only to samples taken after fasting for at least 8 hours.  Glucose, capillary     Status: Abnormal   Collection Time: 09/21/20  3:07 AM  Result Value Ref Range   Glucose-Capillary 129 (H) 70 - 99 mg/dL     Comment: Glucose reference range applies only to samples taken after fasting for at least 8 hours.  Basic metabolic panel     Status: Abnormal   Collection Time: 09/21/20  5:00 AM  Result Value Ref Range   Sodium 145 135 - 145 mmol/L   Potassium 3.7 3.5 - 5.1 mmol/L   Chloride 105 98 - 111 mmol/L   CO2 30 22 - 32 mmol/L   Glucose, Bld 136 (H) 70 - 99 mg/dL    Comment: Glucose reference range applies only to samples taken after fasting for at least 8 hours.   BUN 34 (H) 8 - 23 mg/dL   Creatinine, Ser 9.14 (H) 0.61 - 1.24 mg/dL   Calcium 8.5 (L) 8.9 - 10.3 mg/dL   GFR, Estimated 56 (L) >60 mL/min    Comment: (NOTE) Calculated using the CKD-EPI Creatinine Equation (2021)    Anion gap 10 5 - 15    Comment: Performed at [facility], [address], [place], [place] [address]  Glucose, capillary     Status: Abnormal   Collection Time: 09/21/20  7:05 AM  Result Value Ref Range   Glucose-Capillary 209 (H) 70 - 99 mg/dL    Comment: Glucose reference range applies only to samples taken after fasting for at least 8 hours.  Glucose, capillary     Status: Abnormal   Collection Time: 09/21/20 11:03 AM  Result Value Ref Range   Glucose-Capillary 223 (H) 70 - 99 mg/dL    Comment: Glucose reference range applies only to samples taken after fasting for at least 8 hours.  Glucose, capillary     Status: Abnormal   Collection Time: 09/21/20  5:29 PM  Result Value Ref Range   Glucose-Capillary 173 (H) 70 - 99 mg/dL    Comment: Glucose reference range applies only to samples taken after fasting for at least 8 hours.  Glucose, capillary     Status: Abnormal   Collection Time:  09/21/20  9:39 PM  Result Value Ref Range   Glucose-Capillary 150 (H) 70 - 99 mg/dL    Comment: Glucose reference range applies only to samples taken after fasting for at least 8 hours.  Basic metabolic panel     Status: Abnormal   Collection Time: 09/22/20  6:56 AM  Result Value Ref Range   Sodium 143 135 - 145 mmol/L    Potassium 3.5 3.5 - 5.1 mmol/L   Chloride 100 98 - 111 mmol/L   CO2 29 22 - 32 mmol/L   Glucose, Bld 129 (H) 70 - 99 mg/dL    Comment: Glucose reference range applies only to samples taken after fasting for at least 8 hours.   BUN 25 (H) 8 - 23 mg/dL   Creatinine, Ser 1.02 0.61 - 1.24 mg/dL   Calcium 8.6 (L) 8.9 - 10.3 mg/dL   GFR, Estimated >58 >52 mL/min    Comment: (NOTE) Calculated using the CKD-EPI Creatinine Equation (2021)    Anion gap 14 5 - 15    Comment: Performed at [facility], [address], [place], [place] [address]  CBC     Status: Abnormal   Collection Time: 09/22/20  6:56 AM  Result Value Ref Range   WBC 11.8 (H) 4.0 - 10.5 K/uL   RBC 3.51 (L) 4.22 - 5.81 MIL/uL   Hemoglobin 9.5 (L) 13.0 - 17.0 g/dL   HCT 23.5 (L) 39 - 52 %   MCV 86.0 80.0 - 100.0 fL   MCH 27.1 26.0 - 34.0 pg   MCHC 31.5 30.0 - 36.0 g/dL   RDW 36.1 (H) 44.3 - 15.4 %   Platelets 241 150 - 400 K/uL   nRBC 0.0 0.0 - 0.2 %    Comment: Performed at [facility], [address], [place], [place] [address]  Glucose, capillary     Status: Abnormal   Collection Time: 09/22/20 11:22 AM  Result Value Ref Range   Glucose-Capillary 174 (H) 70 - 99 mg/dL    Comment: Glucose reference range applies only to samples taken after fasting for at least 8 hours.   DG Chest Port 1 View  Result Date: 09/21/2020 CLINICAL DATA:  Hypoxia. EXAM: PORTABLE CHEST 1 VIEW COMPARISON:  09/20/2020 FINDINGS: Endotracheal tube and nasogastric tube have been removed. RIGHT subclavian line has been removed. Heart is enlarged in also accentuated by technique, stable in appearance. There are patchy opacities throughout the lungs bilaterally. There are bilateral pleural effusions. Lucency at the LATERAL RIGHT lung base appears stable and likely represents known empyema. There is increased atelectasis or early infiltrate in the LEFT LATERAL lung base. IMPRESSION: 1. Persistent bilateral infiltrates and pleural effusions.  2. Stable appearance of RIGHT empyema. 3. Increased atelectasis or early infiltrate in the LEFT LATERAL lung base. Electronically Signed   By: Norva Pavlov M.D.   On: 09/21/2020 18:33    Review Of Systems Constitutional: Positive fever, chills, weight loss. Eyes: No vision change, wears glasses. No discharge or pain. Ears: No hearing loss, No tinnitus. Respiratory: No asthma, Positive COPD, pneumoniasm shortness of breath. No hemoptysis. Cardiovascular: No chest pain, palpitation, leg edema. Gastrointestinal: No nausea, vomiting, diarrhea, constipation. No GI bleed. No hepatitis. Genitourinary: No dysuria, hematuria, kidney stone. No incontinance.  Neurological: No headache, stroke, seizures.  Psychiatry: No psych facility admission for anxiety, depression, suicide. No detox. Skin: No rash. Musculoskeletal: Positive joint pain, no fibromyalgia. No neck pain, back pain. Lymphadenopathy: No lymphadenopathy. Hematology: Positive anemia, no  easy bruising.   Blood pressure 133/84, pulse 71, temperature 98.7 F (37.1 C), temperature source Oral, resp. rate (!) 22, height 5\' 9"  (1.753 m), weight 56.6 kg, SpO2 96 %. Body mass index is 18.43 kg/m. General appearance: alert, cooperative, appears stated age and no distress Head: Normocephalic, atraumatic. Eyes: Brown eyes, Pale pink conjunctiva, corneas clear.  Neck: No adenopathy, no carotid bruit, no JVD, supple, symmetrical, trachea midline and thyroid not enlarged. Resp: Scattered rhonchi to auscultation bilaterally. Cardio: Regular rate and rhythm, S1, S2 normal, II/VI systolic murmur, no click, rub or gallop GI: Soft, non-tender; bowel sounds normal; no organomegaly. Extremities: No edema, cyanosis or clubbing. Right AKA. Skin: Warm and dry.  Neurologic: Alert and oriented X 3, normal strength.   Assessment/Plan Acute combined systolic and diastolic left heart failure, compensated Acute on chronic respiratory failure with  hypoxia Multifocal pneumonia COVID-19 infection Bilateral pleural effusion Right empyema Acute kidney injury, resolving Anemia of acute blood loss CAD Moderate AS HTN PVD Right AKA  Medical therapy for now. Patient is at high risk for cardiac interventions due to multiple co-morbidities.  Time spent: Review of old records, Lab, x-rays, EKG, other cardiac tests, examination, discussion with patient and referring doctor over 70 minutes.  Ricki RodriguezAjay S Decarlo Rivet, MD  09/22/2020, 2:19 PM

## 2020-09-22 NOTE — Progress Notes (Signed)
PROGRESS NOTE    Jeremiah [name]  EZM:[id number] DOB: September 27, 1946 DOA: 09/14/2020 PCP: Clovia Cuff, MD   Brief Narrative:  74 yo male resident of [place] with medical history significant of peripheral vascular disease, s/p right AKI, chronic diastolic heart failure, hypertension type 2 diabetes mellitus presented with dyspnea and hypoxia was in hospital from 08/09/20 to 08/21/20 with COVID 19 and aspiration pneumonia complicated by Rt pleural effusion treated with pig tail catheter drainage.  He was then in hospital from 08/26/20 to 08/30/20 with HCAP.  He presented to ER again on 09/14/20 with dyspnea from HCAP and Rt pleural effusion.  Developed respiratory distress leading to cardiac arrest on 10/26 and PCCM assumed care in ICU requiring intubation.  Subsequently he was extubated on 09/20/2020 and transferred back to [facility] on 09/22/2020.  ED Course: labs were significant for elevated creatinine of 1.73, elevated BNP of 1634, troponin of 29, hemoglobin of 7.4 and wbc count of 11.5. CXR shows Similar bilateral airspace opacities, compatible with multifocal pneumonia. Suspected layering left pleural effusion and similar chronic right pleural effusion with internal gas.  CT chest shows Progressive consolidation within the right upper lobe. Stable collapse and consolidation of the right middle lobe and lower lobes bilaterally.  Stable complex gas and fluid collection within the right pleural space with associated pleural thickening most in keeping with an empyema.  Significant Hospital Events   10/22 Admit 10/24 worsening hypoxia, needing Bipap 10/25 to IR for u/s assessment of pleural space >> not fluid pocket amenable to thoracentesis 10/26 Respiratory leading to cardiac arrest with ROSC in 10 minutes 10/27 Transfuse 2 units PRBC 10/28 Extubated  Micro Data:  MRSA PCR 10/22 >> negative COVID 10/22 >> negative Blood 10/22 >> negative Rt pleural fluid 10/26 >>  Sputum 10/26 >> rare  budding yeast    Assessment & Plan:   Active Problems:   Pressure injury of skin   Acute respiratory failure with hypoxia (HCC)   Protein-calorie malnutrition, severe   Palliative care by specialist   Goals of care, counseling/discussion   Acute hypoxic respiratory failure from HCAP and Rt empyema with trapped lung. - f/u CXR on 11/02 - bronchial hygiene - day 8/14 of ABx Currently only on 2 L of oxygen.  Had some respiratory distress last night.  Chest x-ray was repeated which does not show any change.  Nurse at the bedside and concerned that patient may have aspirated.  Will consult SLP.  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 - status post tube thoracostomy last month as well as pleural thrombolytics with fairly good results.  He now has a left-sided loculated effusion as well as changes concerning for an empyema on the right.  The right side was sampled and looks more consistent with just old blood with the thickened rind likely secondary to the thrombolytics administered.  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 - f/u pleural fluid cultures - chest tube removed 10/27  History of CAD and aortic stenosis with acute systolic CHF after cardiac arrest: Echo done in July this year showed 70% ejection fraction but repeat echo during this hospitalization done on 09/18/2020 shows 45% ejection fraction with left ventricular hypokinesis.  Appears slightly volume overloaded.  He received IV Lasix intermittently during this hospitalization.  Will order another dose of 40 mg IV today.  Also not clear to me why his antiplatelets are on hold.  He was on both aspirin and  Plavix prior to admission.  Hemoglobin has remained stable for 3 days so I will resume aspirin but now I am consulting cardiology for new systolic congestive heart failure so I will hold his Plavix for possible cath.  Essential hypertension: Blood pressure  elevated.  He was on several antihypertensives prior to admission and all of them are on hold which include Toprol-XL, hydralazine, amlodipine.  Will resume Toprol-XL and amlodipine but hold hydralazine for now.  Pericardial effusion without tamponade: Asymptomatic.  Monitor for now.  AKI: Resolved.  Anemia of chronic disease: Per chart review, it seems like his hemoglobin dropped to less than 7 on 09/20/2019 for which he received 2 unit of PRBC transfusion and since then his hemoglobin has remained stable.  Monitor daily.  DM type 2 poorly controlled with hyperglycemia/peripheral neuropathy: Recent hemoglobin A1c 6.0.  On oral hypoglycemics at home.  Hold them.  Blood sugar slightly elevated.  Start on Lantus 10 units and continue SSI.  Resume gabapentin.  Pressure wounds: Wound care on board.  Severe protein calorie malnutrition: We will consult nutrition.  Deconditioning: Consult PT OT.  DVT prophylaxis: enoxaparin (LOVENOX) injection 40 mg Start: 09/20/20 1415   Code Status: Full Code  Family Communication: None present at bedside.  Plan of care discussed with patient in length and he verbalized understanding and agreed with it.  Status is: Inpatient  Remains inpatient appropriate because:Inpatient level of care appropriate due to severity of illness   Dispo: The patient is from: SNF              Anticipated d/c is to: SNF              Anticipated d/c date is: 3 days              Patient currently is not medically stable to d/c.        Estimated body mass index is 18.43 kg/m as calculated from the following:   Height as of this encounter: _0  (1.753 m).   Weight as of this encounter: 56.6 kg.  Pressure Injury 08/27/20 Heel Left Deep Tissue Pressure Injury - Purple or maroon localized area of discolored intact skin or blood-filled blister due to damage of underlying soft tissue from pressure and/or shear. (Active)  08/27/20 1033  Location: Heel  Location Orientation:  Left  Staging: Deep Tissue Pressure Injury - Purple or maroon localized area of discolored intact skin or blood-filled blister due to damage of underlying soft tissue from pressure and/or shear.  Wound Description (Comments):   Present on Admission: Yes     Pressure Injury 09/18/20 Buttocks Left Stage 2 -  Partial thickness loss of dermis presenting as a shallow open injury with a red, pink wound bed without slough. (Active)  09/18/20 1000  Location: Buttocks  Location Orientation: Left  Staging: Stage 2 -  Partial thickness loss of dermis presenting as a shallow open injury with a red, pink wound bed without slough.  Wound Description (Comments):   Present on Admission:      Pressure Injury 09/18/20 Buttocks Right Stage 2 -  Partial thickness loss of dermis presenting as a shallow open injury with a red, pink wound bed without slough. (Active)  09/18/20 1000  Location: Buttocks  Location Orientation: Right  Staging: Stage 2 -  Partial thickness loss of dermis presenting as a shallow open injury with a red, pink wound bed without slough.  Wound Description (Comments):   Present on Admission:  Nutritional status:  Nutrition Problem: Severe Malnutrition Etiology: acute illness (COVID PNA, aspiration PNA)   Signs/Symptoms: moderate fat depletion, severe muscle depletion   Interventions: Ensure Enlive (each supplement provides 350kcal and 20 grams of protein)    Consultants:   Consulting cardiology today  PCCM on board  Procedures:  ETT 10/26 >> 10/28 Lt IJ CVL 10/26 >> 10/26 Rt [place] CVL 10/26 >>  Rt pig tail catheter 10/26 >> 10/27  Antimicrobials:   Antimicrobials:  Cefepime 10/22 >> 10/26  Vancomycin 10/26 Zosyn 10/26 >>  Anti-infectives (From admission, onward)   Start     Dose/Rate Route Frequency Ordered Stop   09/19/20 1600  vancomycin (VANCOCIN) IVPB 1000 mg/200 mL premix  Status:  Discontinued        1,000 mg 200 mL/hr over 60 Minutes Intravenous  Every 24 hours 09/18/20 1423 09/20/20 0833   09/18/20 1600  vancomycin (VANCOREADY) IVPB 1250 mg/250 mL        1,250 mg 166.7 mL/hr over 90 Minutes Intravenous  Once 09/18/20 1423 09/18/20 1916   09/18/20 1515  piperacillin-tazobactam (ZOSYN) IVPB 3.375 g        3.375 g 12.5 mL/hr over 240 Minutes Intravenous Every 8 hours 09/18/20 1423 09/28/20 1359   09/16/20 1500  vancomycin (VANCOCIN) IVPB 1000 mg/200 mL premix  Status:  Discontinued        1,000 mg 200 mL/hr over 60 Minutes Intravenous Every 24 hours 09/15/20 1359 09/16/20 0802   09/15/20 1800  vancomycin (VANCOCIN) IVPB 1000 mg/200 mL premix  Status:  Discontinued        1,000 mg 200 mL/hr over 60 Minutes Intravenous Every 24 hours 09/14/20 1726 09/15/20 1359   09/15/20 1430  vancomycin (VANCOREADY) IVPB 1500 mg/300 mL        1,500 mg 150 mL/hr over 120 Minutes Intravenous  Once 09/15/20 1359 09/15/20 1710   09/14/20 2200  ceFEPIme (MAXIPIME) 2 g in sodium chloride 0.9 % 100 mL IVPB  Status:  Discontinued        2 g 200 mL/hr over 30 Minutes Intravenous Every 12 hours 09/14/20 1726 09/18/20 1309   09/14/20 1730  vancomycin (VANCOREADY) IVPB 1500 mg/300 mL  Status:  Discontinued        1,500 mg 150 mL/hr over 120 Minutes Intravenous  Once 09/14/20 1726 09/15/20 1359         Subjective: Patient seen and examined in ICU.  He had no complaint.  He was on 2.5 L of oxygen but denied any shortness of breath or chest pain.  He was alert and oriented.  Objective: Vitals:   09/22/20 0342 09/22/20 0400 09/22/20 0500 09/22/20 0600  BP:  (!) 140/56 (!) 133/59 (!) 154/66  Pulse:  (!) 59 (!) 58 (!) 58  Resp:  (!) _0 Temp: 98.4 F (36.9 C)     TempSrc: Oral     SpO2:  100% 98% 99%  Weight:   56.6 kg   Height:        Intake/Output Summary (Last 24 hours) at 09/22/2020 1056 Last data filed at 09/22/2020 0538 Gross per 24 hour  Intake 426.29 ml  Output 2100 ml  Net -1673.71 ml   Filed Weights   09/20/20 0500 09/21/20 0500  09/22/20 0500  Weight: 63 kg 63 kg 56.6 kg    Examination:  General exam: Appears calm and comfortable  Respiratory system: Diminished breath sounds bilaterally. Respiratory effort normal. Cardiovascular system: S1 & S2 heard, RRR. No JVD, murmurs, rubs,  gallops or clicks. No pedal edema. Gastrointestinal system: Abdomen is nondistended, soft and nontender. No organomegaly or masses felt. Normal bowel sounds heard. Central nervous system: Alert and oriented. No focal neurological deficits. Extremities: Symmetric 5 x 5 power. Skin: No rashes, lesions or ulcers Psychiatry: Judgement and insight appear normal. Mood & affect appropriate.    Data Reviewed: I have personally reviewed following labs and imaging studies  CBC: Recent Labs  Lab 09/17/20 0301 09/17/20 0301 09/18/20 0357 09/18/20 0357 09/18/20 1224 09/19/20 0731 09/19/20 1955 09/20/20 0500 09/22/20 0656  WBC 14.1*  --  12.7*  --   --  13.8*  --  13.4* 11.8*  NEUTROABS 10.5*  --  10.1*  --   --  9.8*  --  9.6*  --   HGB 8.2*   < > 7.6*   < > 7.1* 6.1* 9.0* 8.6* 9.5*  HCT 26.9*   < > 24.8*   < > 21.0* 19.6* 27.4* 26.5* 30.2*  MCV 90.6  --  90.2  --   --  86.7  --  83.6 86.0  PLT 312  --  292  --   --  269  --  267 241   < > = values in this interval not displayed.   Basic Metabolic Panel: Recent Labs  Lab 09/18/20 1223 09/18/20 1223 09/18/20 1224 09/19/20 0710 09/19/20 1955 09/20/20 0500 09/20/20 1700 09/20/20 1955 09/21/20 0500 09/22/20 0656  NA 139   < > 140 142  --  143  --   --  145 143  K 5.3*   < > 5.3* 4.3  --  3.2*  --   --  3.7 3.5  CL 105  --   --  107  --  108  --   --  105 100  CO2 24  --   --  21*  --  25  --   --  30 29  GLUCOSE 157*  --   --  121*  --  227*  --   --  136* 129*  BUN 63*  --   --  69*  --  57*  --   --  34* 25*  CREATININE 1.63*  --   --  1.88*  --  1.58*  --   --  1.32* 1.19  CALCIUM 9.4  --   --  8.8*  --  8.6*  --   --  8.5* 8.6*  MG 2.6*  --   --  2.3 2.2 2.0 1.7  --   --    --   PHOS 4.8*   < >  --  2.5 2.1* 1.5* 5.6* 4.5  --   --    < > = values in this interval not displayed.   GFR: Estimated Creatinine Clearance: 42.3 mL/min (by C-G formula based on SCr of 1.19 mg/dL). Liver Function Tests: No results for input(s): AST, ALT, ALKPHOS, BILITOT, PROT, ALBUMIN in the last 168 hours. No results for input(s): LIPASE, AMYLASE in the last 168 hours. No results for input(s): AMMONIA in the last 168 hours. Coagulation Profile: No results for input(s): INR, PROTIME in the last 168 hours. Cardiac Enzymes: No results for input(s): CKTOTAL, CKMB, CKMBINDEX, TROPONINI in the last 168 hours. BNP (last 3 results) No results for input(s): PROBNP in the last 8760 hours. HbA1C: No results for input(s): HGBA1C in the last 72 hours. CBG: Recent Labs  Lab 09/21/20 0307 09/21/20 0705 09/21/20 1103 09/21/20 1729 09/21/20 2139  GLUCAP  129* 209* 223* 173* 150*   Lipid Profile: No results for input(s): CHOL, HDL, LDLCALC, TRIG, CHOLHDL, LDLDIRECT in the last 72 hours. Thyroid Function Tests: No results for input(s): TSH, T4TOTAL, FREET4, T3FREE, THYROIDAB in the last 72 hours. Anemia Panel: No results for input(s): VITAMINB12, FOLATE, FERRITIN, TIBC, IRON, RETICCTPCT in the last 72 hours. Sepsis Labs: Recent Labs  Lab 09/17/20 0301 09/18/20 0357  PROCALCITON 0.70 1.36    Recent Results (from the past 240 hour(s))  Respiratory Panel by RT PCR (Flu A&B, Covid) - Nasopharyngeal Swab     Status: None   Collection Time: 09/14/20  5:39 PM   Specimen: Nasopharyngeal Swab  Result Value Ref Range Status   SARS Coronavirus 2 by RT PCR NEGATIVE NEGATIVE Final    Comment: (NOTE) SARS-CoV-2 target nucleic acids are NOT DETECTED.  The SARS-CoV-2 RNA is generally detectable in upper respiratoy specimens during the acute phase of infection. The lowest concentration of SARS-CoV-2 viral copies this assay can detect is 131 copies/mL. A negative result does not preclude  SARS-Cov-2 infection and should not be used as the sole basis for treatment or other patient management decisions. A negative result may occur with  improper specimen collection/handling, submission of specimen other than nasopharyngeal swab, presence of viral mutation(s) within the areas targeted by this assay, and inadequate number of viral copies (<131 copies/mL). A negative result must be combined with clinical observations, patient history, and epidemiological information. The expected result is Negative.  Fact Sheet for Patients:  PinkCheek.be  Fact Sheet for Healthcare Providers:  GravelBags.it  This test is no t yet approved or cleared by the [place] FDA and  has been authorized for detection and/or diagnosis of SARS-CoV-2 by FDA under an Emergency Use Authorization (EUA). This EUA will remain  in effect (meaning this test can be used) for the duration of the COVID-19 declaration under Section 564(b)(1) of the Act, 21 U.S.C. section 360bbb-3(b)(1), unless the authorization is terminated or revoked sooner.     Influenza A by PCR NEGATIVE NEGATIVE Final   Influenza B by PCR NEGATIVE NEGATIVE Final    Comment: (NOTE) The Xpert Xpress SARS-CoV-2/FLU/RSV assay is intended as an aid in  the diagnosis of influenza from Nasopharyngeal swab specimens and  should not be used as a sole basis for treatment. Nasal washings and  aspirates are unacceptable for Xpert Xpress SARS-CoV-2/FLU/RSV  testing.  Fact Sheet for Patients: PinkCheek.be  Fact Sheet for Healthcare Providers: GravelBags.it  This test is not yet approved or cleared by the [place] FDA and  has been authorized for detection and/or diagnosis of SARS-CoV-2 by  FDA under an Emergency Use Authorization (EUA). This EUA will remain  in effect (meaning this test can be used) for the duration of the    Covid-19 declaration under Section 564(b)(1) of the Act, 21  U.S.C. section 360bbb-3(b)(1), unless the authorization is  terminated or revoked. Performed at [facility], [place] [address] [place], [place] [address]   Culture, blood (Routine X 2) w Reflex to ID Panel     Status: None   Collection Time: 09/14/20  8:13 PM   Specimen: BLOOD RIGHT HAND  Result Value Ref Range Status   Specimen Description BLOOD RIGHT HAND  Final   Special Requests   Final    BOTTLES DRAWN AEROBIC ONLY Blood Culture adequate volume   Culture   Final    NO GROWTH 5 DAYS Performed at [facility], [place] [address],  [place], [place] [address]    Report Status 09/19/2020 FINAL  Final  Culture, blood (Routine X 2) w Reflex to ID Panel     Status: None   Collection Time: 09/14/20  8:24 PM   Specimen: BLOOD LEFT HAND  Result Value Ref Range Status   Specimen Description BLOOD LEFT HAND  Final   Special Requests   Final    BOTTLES DRAWN AEROBIC ONLY Blood Culture results may not be optimal due to an inadequate volume of blood received in culture bottles   Culture   Final    NO GROWTH 5 DAYS Performed at [facility], [place] [address] [place], [place] [address]    Report Status 09/19/2020 FINAL  Final  Culture, respiratory (non-expectorated)     Status: None   Collection Time: 09/18/20 12:23 PM   Specimen: Bronchoalveolar Lavage; Respiratory  Result Value Ref Range Status   Specimen Description BRONCHIAL ALVEOLAR LAVAGE  Final   Special Requests NONE  Final   Gram Stain   Final    RARE WBC PRESENT, PREDOMINANTLY PMN RARE BUDDING YEAST SEEN    Culture   Final    RARE Normal respiratory flora-no Staph aureus or Pseudomonas seen Performed at [facility], [address], [place], [place] [address]    Report Status 09/20/2020 FINAL  Final  Fungus Culture With Stain     Status: None (Preliminary result)   Collection Time: 09/18/20 12:23 PM   Specimen: Bronchial Alveolar Lavage   Result Value Ref Range Status   Fungus Stain Final report  Final    Comment: (NOTE) Performed At: [facility] [place], [place] [id number] Rush Farmer MD [id number]    Fungus (Mycology) Culture PENDING  Incomplete   Fungal Source BRONCHIAL ALVEOLAR LAVAGE  Final    Comment: Performed at [facility], [place] [address], [place], [place] [address]  Fungus Culture Result     Status: None   Collection Time: 09/18/20 12:23 PM  Result Value Ref Range Status   Result 1 Comment  Final    Comment: (NOTE) KOH/Calcofluor preparation:  no fungus observed. Performed At: [facility] [place], [place] [id number] Rush Farmer MD [id number]   Fungus Culture With Stain     Status: None (Preliminary result)   Collection Time: 09/18/20  4:01 PM   Specimen: Pleural, Right  Result Value Ref Range Status   Fungus Stain Final report  Final    Comment: (NOTE) Performed At: [facility] [place], [place] [id number] Rush Farmer MD ID:[id number]    Fungus (Mycology) Culture PENDING  Incomplete   Fungal Source FLUID  Final    Comment: RIGHT PLEURAL Performed at [facility], [place] [address] [place], [place] [address]   Gram stain     Status: None   Collection Time: 09/18/20  4:01 PM   Specimen: Fluid  Result Value Ref Range Status   Specimen Description FLUID PLEURAL RIGHT  Final   Special Requests BOTTLES DRAWN AEROBIC AND ANAEROBIC  Final   Gram Stain   Final    FEW WBC PRESENT,BOTH PMN AND MONONUCLEAR NO ORGANISMS SEEN Performed at [facility], [place] [address], [place], [place] [address]    Report Status 09/18/2020 FINAL  Final  Culture, body fluid-bottle     Status: None (Preliminary result)   Collection Time: 09/18/20  4:01 PM   Specimen: Fluid  Result Value Ref Range Status   Specimen  Description FLUID PLEURAL RIGHT  Final   Special Requests NONE  Final   Culture   Final    NO GROWTH 3  DAYS Performed at [place] [address], [place], [place] [address]    Report Status PENDING  Incomplete  Fungus Culture Result     Status: None   Collection Time: 09/18/20  4:01 PM  Result Value Ref Range Status   Result 1 Comment  Final    Comment: (NOTE) KOH/Calcofluor preparation:  no fungus observed. Performed At: [facility] [place], [place] [id number] Rush Farmer MD [id number]   MRSA PCR Screening     Status: None   Collection Time: 09/19/20 10:25 AM   Specimen: Nasal Mucosa; Nasopharyngeal  Result Value Ref Range Status   MRSA by PCR NEGATIVE NEGATIVE Final    Comment:        The GeneXpert MRSA Assay (FDA approved for NASAL specimens only), is one component of a comprehensive MRSA colonization surveillance program. It is not intended to diagnose MRSA infection nor to guide or monitor treatment for MRSA infections. Performed at [facility], [place] [address], [place], [place] [address]       Radiology Studies: DG Chest Port 1 View  Result Date: 09/21/2020 CLINICAL DATA:  Hypoxia. EXAM: PORTABLE CHEST 1 VIEW COMPARISON:  09/20/2020 FINDINGS: Endotracheal tube and nasogastric tube have been removed. RIGHT subclavian line has been removed. Heart is enlarged in also accentuated by technique, stable in appearance. There are patchy opacities throughout the lungs bilaterally. There are bilateral pleural effusions. Lucency at the LATERAL RIGHT lung base appears stable and likely represents known empyema. There is increased atelectasis or early infiltrate in the LEFT LATERAL lung base. IMPRESSION: 1. Persistent bilateral infiltrates and pleural effusions. 2. Stable appearance of RIGHT empyema. 3. Increased atelectasis or early infiltrate in the LEFT LATERAL lung base. Electronically Signed   By: Nolon Nations M.D.   On: 09/21/2020 18:33    Scheduled Meds: . sodium chloride   Intravenous Once  . aspirin  81 mg Oral Daily  .  atorvastatin  20 mg Oral q1800  . brimonidine  1 drop Both Eyes BID  . Chlorhexidine Gluconate Cloth  6 each Topical Daily  . enoxaparin (LOVENOX) injection  40 mg Subcutaneous Q24H  . feeding supplement  237 mL Oral TID BM  . ferrous sulfate  220 mg Oral BID WC  . gabapentin  300 mg Oral BID  . insulin aspart  0-15 Units Subcutaneous TID WC  . insulin aspart  0-5 Units Subcutaneous QHS  . liver oil-zinc oxide   Topical BID  . mouth rinse  15 mL Mouth Rinse BID  . sodium chloride flush  10-40 mL Intracatheter Q12H  . sodium chloride flush  3 mL Intravenous Q12H  . timolol  1 drop Both Eyes BID  . cyanocobalamin  1,000 mcg Oral Daily   Continuous Infusions: . sodium chloride Stopped (09/18/20 2157)  . piperacillin-tazobactam (ZOSYN)  IV 3.375 g (09/22/20 0531)     LOS: 8 days   Time spent: 41 minutes   Darliss Cheney, MD [facility]  09/22/2020, 10:56 AM   To contact the attending provider between 7A-7P or the covering provider during after hours 7P-7A, please log into the web site [email or web address].

## 2020-09-22 NOTE — Evaluation (Signed)
Clinical/Bedside Swallow Evaluation Patient Details  Name: Jeremiah [name] MRN: [id number] Date of Birth: 01/03/1946  Today's Date: 09/22/2020 Time: SLP Start Time (ACUTE ONLY): 1324 SLP Stop Time (ACUTE ONLY): 1353 SLP Time Calculation (min) (ACUTE ONLY): 29 min  Past Medical History:  Past Medical History:  Diagnosis Date  . Amputee   . Aortic atherosclerosis (HCC)   . Arthritis    "joints; shoulders, knees, hands, back" (05/21/2018)  . Atherosclerosis of coronary artery   . C. difficile diarrhea 04/2018  . Diastolic CHF (HCC)   . Diverticulosis   . High cholesterol   . History of gout   . Hypertension   . IDA (iron deficiency anemia)    from referral Dr Darleene Cleaver  . Internal hemorrhoids   . Peripheral vascular disease (HCC)   . Pneumonia    "couple times" (05/21/2018)  . Sleep apnea    "has mask; won't use" (05/21/2018)  . Type II diabetes mellitus (HCC)    Past Surgical History:  Past Surgical History:  Procedure Laterality Date  . ABDOMINAL AORTOGRAM W/LOWER EXTREMITY N/A 11/01/2018   Procedure: ABDOMINAL AORTOGRAM W/LOWER EXTREMITY;  Surgeon: Runell Gess, MD;  Location: [facility];  Service: Cardiovascular;  Laterality: N/A;  . ABDOMINAL AORTOGRAM W/LOWER EXTREMITY Left 06/20/2020   Procedure: ABDOMINAL AORTOGRAM W/LOWER EXTREMITY;  Surgeon: Cephus Shelling, MD;  Location: [facility];  Service: Cardiovascular;  Laterality: Left;  . AMPUTATION Right 11/09/2018   Procedure: RIGHT - AMPUTATION ABOVE KNEE;  Surgeon: Maeola Harman, MD;  Location: [facility] OR;  Service: Vascular;  Laterality: Right;  . CATARACT EXTRACTION W/ INTRAOCULAR LENS  IMPLANT, BILATERAL Bilateral   . CHOLECYSTECTOMY  05/21/2018   ATTEMPTED LAPAROSCOPIC CHOLECYSTECTOMY, OPEN DRAINAGE OF GALLBLADDER WITH BIOPSY  . CHOLECYSTECTOMY N/A 05/21/2018   Procedure: ATTEMPTED LAPAROSCOPIC CHOLECYSTECTOMY, OPEN DRAINAGE OF GALLBLADDER WITH BIOPSY;  Surgeon: Griselda Miner, MD;   Location: [facility] OR;  Service: General;  Laterality: N/A;  . COLONOSCOPY W/ POLYPECTOMY    . COLONOSCOPY WITH PROPOFOL N/A 09/16/2018   Procedure: COLONOSCOPY WITH PROPOFOL;  Surgeon: Charlott Rakes, MD;  Location: [facility]OS[facility];  Service: Endoscopy;  Laterality: N/A;  . 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 FLEXIBLE SIGMOIDOSCOPY N/A 04/20/2020   Procedure: FLEXIBLE SIGMOIDOSCOPY;  Surgeon: Meridee Score Netty Starring., MD;  Location: [facility];  Service: Gastroenterology;  Laterality: N/A;  Fecal Disimpaction  . IR CATHETER TUBE CHANGE  04/14/2018  . IR CHOLANGIOGRAM EXISTING TUBE  03/17/2018  . IR PERC CHOLECYSTOSTOMY  01/31/2018  . IR RADIOLOGIST EVAL & MGMT  03/02/2018  . PERIPHERAL VASCULAR INTERVENTION Left 06/20/2020   Procedure: PERIPHERAL VASCULAR INTERVENTION;  Surgeon: Cephus Shelling, MD;  Location: [facility] INVASIVE CV LAB;  Service: Cardiovascular;  Laterality: Left;  4 SFA STENTS   HPI:  74 yo male resident of [place] with medical history significant of peripheral vascular disease, s/p right AKI, chronic diastolic heart failure, hypertension type 2 diabetes mellitus presented with dyspnea and hypoxia was in hospital from 08/09/20 to 08/21/20 with COVID 19 and aspiration pneumonia complicated by Rt pleural effusion treated with pig tail catheter drainage.  He was then in hospital from 08/26/20 to 08/30/20 with HCAP.  He presented to ER again on 09/14/20 with dyspnea from HCAP and Rt pleural effusion. Pt evaluated by SLP on 10/23 and found to have no signs of dysphagia. On 10/26 pt developed  respiratory distress and cardiac arrest and was intubated until 10/28. Again on 10/29 pt had some respiratory distress. There was concern for aspiration.    Assessment / Plan / Recommendation Clinical Impression  Pt reportedly had a severe choking event this am while attempting  cereal and banana. He feels this was due to not having his dentures. However, his RN has noticed further episodes of desaturation after meals and prolonged coughing. He is noted to have very delayed coughing after sips of liquids and bites of puree that might be just a baseline cough or could indicate silent aspiration. Pt also has complaints that couldbe caused by esophageal dysphagia. Instrumental assessment is warranted given repeated concern for aspiration pna and choking for this pt. His son was called to emphasize the need for pt to have his dentures. Hopefully these will be delivered tonight or tomorrow am. MBS recommended for tomorrow if dentures are present. For now pt can continue a full liquid diet with large meds crushed.  SLP Visit Diagnosis: Dysphagia, unspecified (R13.10)    Aspiration Risk  Moderate aspiration risk    Diet Recommendation Thin liquid   Liquid Administration via: Cup;Straw Medication Administration: Crushed with puree Supervision: Patient able to self feed Compensations: Slow rate;Small sips/bites Postural Changes: Seated upright at 90 degrees    Other  Recommendations     Follow up Recommendations        Frequency and Duration            Prognosis Prognosis for Safe Diet Advancement: Good      Swallow Study   General HPI: 74 yo male resident of [place] with medical history significant of peripheral vascular disease, s/p right AKI, chronic diastolic heart failure, hypertension type 2 diabetes mellitus presented with dyspnea and hypoxia was in hospital from 08/09/20 to 08/21/20 with COVID 19 and aspiration pneumonia complicated by Rt pleural effusion treated with pig tail catheter drainage.  He was then in hospital from 08/26/20 to 08/30/20 with HCAP.  He presented to ER again on 09/14/20 with dyspnea from HCAP and Rt pleural effusion. Pt evaluated by SLP on 10/23 and found to have no signs of dysphagia. On 10/26 pt developed respiratory distress and  cardiac arrest and was intubated until 10/28. Again on 10/29 pt had some respiratory distress. There was concern for aspiration.  Type of Study: Bedside Swallow Evaluation Diet Prior to this Study: Dysphagia 3 (soft);Thin liquids Temperature Spikes Noted: No Respiratory Status: Nasal cannula History of Recent Intubation: Yes Length of Intubations (days): 2 days Date extubated: 09/20/20 Behavior/Cognition: Alert;Cooperative;Pleasant mood Oral Cavity Assessment: Within Functional Limits Oral Care Completed by SLP: No Oral Cavity - Dentition: Missing dentition;Dentures, not available Vision: Functional for self-feeding Self-Feeding Abilities: Able to feed self Patient Positioning: Upright in bed Baseline Vocal Quality: Normal Volitional Cough: Weak Volitional Swallow: Able to elicit    Oral/Motor/Sensory Function Overall Oral Motor/Sensory Function: Within functional limits   Ice Chips Ice chips: Not tested   Thin Liquid Thin Liquid: Impaired Presentation: Straw Pharyngeal  Phase Impairments: Cough - Delayed    Nectar Thick Nectar Thick Liquid: Within functional limits   Honey Thick Honey Thick Liquid: Not tested   Puree Puree: Impaired Pharyngeal Phase Impairments: Cough - Delayed   Solid     Solid: Not tested     Harlon Ditty, MA CCC-SLP  Acute Rehabilitation Services Pager [phone] Office [phone]  Aspasia Rude, Riley Nearing 09/22/2020,2:00 PM

## 2020-09-23 ENCOUNTER — Inpatient Hospital Stay (HOSPITAL_COMMUNITY): Payer: Medicare Other

## 2020-09-23 DIAGNOSIS — J9601 Acute respiratory failure with hypoxia: Secondary | ICD-10-CM | POA: Diagnosis not present

## 2020-09-23 LAB — COMPREHENSIVE METABOLIC PANEL
ALT: 41 U/L (ref 0–44)
AST: 14 U/L — ABNORMAL LOW (ref 15–41)
Albumin: 1.8 g/dL — ABNORMAL LOW (ref 3.5–5.0)
Alkaline Phosphatase: 152 U/L — ABNORMAL HIGH (ref 38–126)
Anion gap: 10 (ref 5–15)
BUN: 21 mg/dL (ref 8–23)
CO2: 33 mmol/L — ABNORMAL HIGH (ref 22–32)
Calcium: 8.5 mg/dL — ABNORMAL LOW (ref 8.9–10.3)
Chloride: 98 mmol/L (ref 98–111)
Creatinine, Ser: 1.18 mg/dL (ref 0.61–1.24)
GFR, Estimated: >60 mL/min (ref >60)
Glucose, Bld: 171 mg/dL — ABNORMAL HIGH (ref 70–99)
Potassium: 3.4 mmol/L — ABNORMAL LOW (ref 3.5–5.1)
Sodium: 141 mmol/L (ref 135–145)
Total Bilirubin: 0.5 mg/dL (ref 0.3–1.2)
Total Protein: 6.6 g/dL (ref 6.5–8.1)

## 2020-09-23 LAB — CBC WITH DIFFERENTIAL/PLATELET
Abs Immature Granulocytes: 0.06 10*3/uL (ref 0.00–0.07)
Basophils Absolute: 0.1 10*3/uL (ref 0.0–0.1)
Basophils Relative: 1 %
Eosinophils Absolute: 0.6 10*3/uL — ABNORMAL HIGH (ref 0.0–0.5)
Eosinophils Relative: 5 %
HCT: 29.2 % — ABNORMAL LOW (ref 39.0–52.0)
Hemoglobin: 9.2 g/dL — ABNORMAL LOW (ref 13.0–17.0)
Immature Granulocytes: 1 %
Lymphocytes Relative: 13 %
Lymphs Abs: 1.5 10*3/uL (ref 0.7–4.0)
MCH: 27.1 pg (ref 26.0–34.0)
MCHC: 31.5 g/dL (ref 30.0–36.0)
MCV: 85.9 fL (ref 80.0–100.0)
Monocytes Absolute: 1.4 10*3/uL — ABNORMAL HIGH (ref 0.1–1.0)
Monocytes Relative: 12 %
Neutro Abs: 8.2 10*3/uL — ABNORMAL HIGH (ref 1.7–7.7)
Neutrophils Relative %: 68 %
Platelets: 224 10*3/uL (ref 150–400)
RBC: 3.4 MIL/uL — ABNORMAL LOW (ref 4.22–5.81)
RDW: 18.1 % — ABNORMAL HIGH (ref 11.5–15.5)
WBC: 11.9 10*3/uL — ABNORMAL HIGH (ref 4.0–10.5)
nRBC: 0 % (ref 0.0–0.2)

## 2020-09-23 LAB — GLUCOSE, CAPILLARY
Glucose-Capillary: 116 mg/dL — ABNORMAL HIGH (ref 70–99)
Glucose-Capillary: 142 mg/dL — ABNORMAL HIGH (ref 70–99)
Glucose-Capillary: 189 mg/dL — ABNORMAL HIGH (ref 70–99)
Glucose-Capillary: 60 mg/dL — ABNORMAL LOW (ref 70–99)
Glucose-Capillary: 70 mg/dL (ref 70–99)

## 2020-09-23 LAB — CULTURE, BODY FLUID W GRAM STAIN -BOTTLE: Culture: NO GROWTH

## 2020-09-23 LAB — MAGNESIUM: Magnesium: 1.4 mg/dL — ABNORMAL LOW (ref 1.7–2.4)

## 2020-09-23 MED ORDER — POTASSIUM CHLORIDE CRYS ER 20 MEQ PO TBCR
40.0000 meq | EXTENDED_RELEASE_TABLET | Freq: Once | ORAL | Status: AC
  Administered 2020-09-23: 40 meq via ORAL
  Filled 2020-09-23: qty 2

## 2020-09-23 MED ORDER — MAGNESIUM SULFATE 2 GM/50ML IV SOLN
2.0000 g | Freq: Once | INTRAVENOUS | Status: AC
  Administered 2020-09-23: 2 g via INTRAVENOUS
  Filled 2020-09-23: qty 50

## 2020-09-23 NOTE — Progress Notes (Signed)
  Speech Language Pathology Treatment: Dysphagia  Patient Details Name: JAIS DEMIR MRN: [id number] DOB: 1946/02/28 Today's Date: 09/23/2020 Time: 2947-6546 SLP Time Calculation (min) (ACUTE ONLY): 9 min  Assessment / Plan / Recommendation Clinical Impression  Pt was seen briefly for dysphagia treatment. He was alert and cooperative with his family present. Pt's family have brought the pt's dentures and they were in place for the session. Pt tolerated consecutive swallows of thin liquids without overt s/sx of aspiration. Pt and family were educated regarding the procedure and purpose of the modified barium swallow study scheduled for today at 10:30 and plan of care. Understanding and agreement were verbalized.    HPI HPI: 74 yo male resident of [place] with medical history significant of peripheral vascular disease, s/p right AKI, chronic diastolic heart failure, hypertension type 2 diabetes mellitus presented with dyspnea and hypoxia was in hospital from 08/09/20 to 08/21/20 with COVID 19 and aspiration pneumonia complicated by Rt pleural effusion treated with pig tail catheter drainage.  He was then in hospital from 08/26/20 to 08/30/20 with HCAP.  He presented to ER again on 09/14/20 with dyspnea from HCAP and Rt pleural effusion. Pt evaluated by SLP on 10/23 and found to have no signs of dysphagia. On 10/26 pt developed respiratory distress and cardiac arrest and was intubated until 10/28. Again on 10/29 pt had some respiratory distress. There was concern for aspiration.       SLP Plan  MBS       Recommendations  Diet recommendations: Thin liquid (full liquids until MBS) Medication Administration: Crushed with puree Compensations: Slow rate;Small sips/bites                Oral Care Recommendations: Oral care BID Follow up Recommendations: None SLP Visit Diagnosis: Dysphagia, unspecified (R13.10) Plan: MBS       Destini Cambre I. Vear Clock, MS, CCC-SLP Acute Rehabilitation  Services Office number [phone] Pager [phone]                Scheryl Marten 09/23/2020, 10:33 AM

## 2020-09-23 NOTE — Progress Notes (Signed)
Modified Barium Swallow Progress Note  Patient Details  Name: Jeremiah [name] MRN: [id number] Date of Birth: Feb 20, 1946  Today's Date: 09/23/2020  Modified Barium Swallow completed.  Full report located under Chart Review in the Imaging Section.  Brief recommendations include the following:  Clinical Impression  Pt presented with pharyngeal dysphagia characterized by mildly reduced anterior laryngeal movement and reduced duration of cricopharyngeal relaxation which resulted in pyriform sinus residue across consistencies.No functional benefit was noted with postural modifications. A liquid wash reduced, but did not eliminate, residue and pt was unable to demonstrate a mendelsohn maneuver. Penetration (PAS 2) was noted with consecutive swallows of thin liquids, but this is considered to be WNL and no instances of aspiration were demonstrated. Pt denied sensation of pharyngeal residue throughout the study. Due to the pt's recent episode of "choking", the potential impact of pyriform sinus residue on safety is considered. A dysphagia 3 diet with thin liquids is recommended at this time with observance of swallowing precautions. SLP will follow for diet tolerance.    Swallow Evaluation Recommendations       SLP Diet Recommendations: Dysphagia 3 (Mech soft) solids;Thin liquid   Liquid Administration via: Cup;Straw   Medication Administration: Whole meds with liquid   Supervision: Patient able to self feed   Compensations: Slow rate;Small sips/bites;Follow solids with liquid   Postural Changes: Seated upright at 90 degrees;Remain semi-upright after after feeds/meals (Comment)   Oral Care Recommendations: Oral care BID      Lorianna Spadaccini I. Vear Clock, MS, CCC-SLP Acute Rehabilitation Services Office number [phone] Pager [phone]   Scheryl Marten 09/23/2020,12:38 PM

## 2020-09-23 NOTE — Consult Note (Addendum)
Ref: Annita Brod, MD   Subjective:  Feeling better post 3 litre of urine output by IV lasix. Afebrile. Monitor: Sinus rhythm O2 sat 97 to 99 % on 1-2 L oxygen/[place].   Objective:  Vital Signs in the last 24 hours: Temp:  [98 F (36.7 C)-98.7 F (37.1 C)] 98.7 F (37.1 C) (10/31 1127) Pulse Rate:  [57-74] 65 (10/31 0736) Cardiac Rhythm: Normal sinus rhythm (10/31 0700) Resp:  [15-22] 15 (10/31 1127) BP: (126-157)/(50-113) 157/68 (10/31 1127) SpO2:  [94 %-100 %] 99 % (10/31 1127) Weight:  [54.7 kg] 54.7 kg (10/31 0500)  Physical Exam: BP Readings from Last 1 Encounters:  09/23/20 (!) 157/68     Wt Readings from Last 1 Encounters:  09/23/20 54.7 kg    Weight change: -1.942 kg Body mass index is 17.79 kg/m. HEENT: [place]/AT, Eyes-Brown, Conjunctiva-Pale pink, Sclera-Non-icteric Neck: No JVD, No bruit, Trachea midline. Lungs:  Clearing, Bilateral. Cardiac:  Regular rhythm, normal S1 and S2, no S3. III/VI systolic murmur. Abdomen:  Soft, non-tender. BS present. Extremities:  No edema present. No cyanosis. No clubbing. Right AKA CNS: AxOx3, Cranial nerves grossly intact. Skin: Warm and dry.   Intake/Output from previous day: 10/30 0701 - 10/31 0700 In: 1478 [P.O.:1270; IV Piggyback:208] Out: 3000 [Urine:3000]    Lab Results: BMET    Component Value Date/Time   NA 141 09/23/2020 0325   NA 143 09/22/2020 0656   NA 145 09/21/2020 0500   NA 138 10/26/2018 1215   K 3.4 (L) 09/23/2020 0325   K 3.5 09/22/2020 0656   K 3.7 09/21/2020 0500   CL 98 09/23/2020 0325   CL 100 09/22/2020 0656   CL 105 09/21/2020 0500   CO2 33 (H) 09/23/2020 0325   CO2 29 09/22/2020 0656   CO2 30 09/21/2020 0500   GLUCOSE 171 (H) 09/23/2020 0325   GLUCOSE 129 (H) 09/22/2020 0656   GLUCOSE 136 (H) 09/21/2020 0500   BUN 21 09/23/2020 0325   BUN 25 (H) 09/22/2020 0656   BUN 34 (H) 09/21/2020 0500   BUN 15 10/26/2018 1215   CREATININE 1.18 09/23/2020 0325   CREATININE 1.19 09/22/2020 0656    CREATININE 1.32 (H) 09/21/2020 0500   CALCIUM 8.5 (L) 09/23/2020 0325   CALCIUM 8.6 (L) 09/22/2020 0656   CALCIUM 8.5 (L) 09/21/2020 0500   GFRNONAA >60 09/23/2020 0325   GFRNONAA >60 09/22/2020 0656   GFRNONAA 56 (L) 09/21/2020 0500   GFRAA 53 (L) 08/28/2020 0258   GFRAA 45 (L) 08/27/2020 0307   GFRAA 44 (L) 08/26/2020 2233   CBC    Component Value Date/Time   WBC 11.9 (H) 09/23/2020 0325   RBC 3.40 (L) 09/23/2020 0325   HGB 9.2 (L) 09/23/2020 0325   HGB 8.9 (L) 10/26/2018 1215   HCT 29.2 (L) 09/23/2020 0325   HCT 27.2 (L) 10/26/2018 1215   PLT 224 09/23/2020 0325   PLT 264 10/26/2018 1215   MCV 85.9 09/23/2020 0325   MCV 80 10/26/2018 1215   MCH 27.1 09/23/2020 0325   MCHC 31.5 09/23/2020 0325   RDW 18.1 (H) 09/23/2020 0325   RDW 14.6 10/26/2018 1215   LYMPHSABS 1.5 09/23/2020 0325   LYMPHSABS 3.2 (H) 10/26/2018 1215   MONOABS 1.4 (H) 09/23/2020 0325   EOSABS 0.6 (H) 09/23/2020 0325   EOSABS 1.8 (H) 10/26/2018 1215   BASOSABS 0.1 09/23/2020 0325   BASOSABS 0.1 10/26/2018 1215   HEPATIC Function Panel Recent Labs    08/21/20 0507 08/26/20 2233 09/23/20 0325  PROT 5.9* 6.5 6.6   HEMOGLOBIN A1C No components found for: HGA1C,  MPG CARDIAC ENZYMES Lab Results  Component Value Date   TROPONINI 0.08 (HH) 03/28/2018   TROPONINI 0.08 (HH) 03/27/2018   TROPONINI 0.07 (HH) 03/27/2018   BNP No results for input(s): PROBNP in the last 8760 hours. TSH No results for input(s): TSH in the last 8760 hours. CHOLESTEROL No results for input(s): CHOL in the last 8760 hours.  Scheduled Meds: . sodium chloride   Intravenous Once  . amLODipine  10 mg Oral Daily  . aspirin  81 mg Oral Daily  . atorvastatin  20 mg Oral q1800  . brimonidine  1 drop Both Eyes BID  . Chlorhexidine Gluconate Cloth  6 each Topical Daily  . enoxaparin (LOVENOX) injection  40 mg Subcutaneous Q24H  . feeding supplement  237 mL Oral TID BM  . ferrous sulfate  325 mg Oral BID WC  . gabapentin   300 mg Oral BID  . insulin aspart  0-15 Units Subcutaneous TID WC  . insulin aspart  0-5 Units Subcutaneous QHS  . insulin glargine  10 Units Subcutaneous Daily  . liver oil-zinc oxide   Topical BID  . mouth rinse  15 mL Mouth Rinse BID  . metoprolol succinate  25 mg Oral Daily  . nystatin  5 mL Oral QID  . sodium chloride flush  10-40 mL Intracatheter Q12H  . sodium chloride flush  3 mL Intravenous Q12H  . timolol  1 drop Both Eyes BID  . cyanocobalamin  1,000 mcg Oral Daily   Continuous Infusions: . sodium chloride Stopped (09/18/20 2157)  . magnesium sulfate bolus IVPB 2 g (09/23/20 1224)  . 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  Continue medical therapy. Cardiac intervention per Dr. Sharyn Lull.   LOS: 9 days   Time spent including chart review, lab review, examination, discussion with patient/nurse and family : 30 min   [name]  MD  09/23/2020, 12:30 PM

## 2020-09-23 NOTE — Progress Notes (Signed)
PROGRESS NOTE    Jeremiah [name]  IHK:[id number] DOB: 1946/01/07 DOA: 09/14/2020 PCP: Clovia Cuff, MD   Brief Narrative:  74 yo male resident of [place] with medical history significant of peripheral vascular disease, s/p right AKI, chronic diastolic heart failure, hypertension type 2 diabetes mellitus presented with dyspnea and hypoxia was in hospital from 08/09/20 to 08/21/20 with COVID 19 and aspiration pneumonia complicated by Rt pleural effusion treated with pig tail catheter drainage.  He was then in hospital from 08/26/20 to 08/30/20 with HCAP.  He presented to ER again on 09/14/20 with dyspnea from HCAP and Rt pleural effusion.  Developed respiratory distress leading to cardiac arrest on 10/26 and PCCM assumed care in ICU requiring intubation.  Subsequently he was extubated on 09/20/2020 and transferred back to [facility] on 09/22/2020.  ED Course: labs were significant for elevated creatinine of 1.73, elevated BNP of 1634, troponin of 29, hemoglobin of 7.4 and wbc count of 11.5. CXR shows Similar bilateral airspace opacities, compatible with multifocal pneumonia. Suspected layering left pleural effusion and similar chronic right pleural effusion with internal gas.  CT chest shows Progressive consolidation within the right upper lobe. Stable collapse and consolidation of the right middle lobe and lower lobes bilaterally.  Stable complex gas and fluid collection within the right pleural space with associated pleural thickening most in keeping with an empyema.  Significant Hospital Events   10/22 Admit 10/24 worsening hypoxia, needing Bipap 10/25 to IR for u/s assessment of pleural space >> not fluid pocket amenable to thoracentesis 10/26 Respiratory leading to cardiac arrest with ROSC in 10 minutes 10/27 Transfuse 2 units PRBC 10/28 Extubated  Micro Data:  MRSA PCR 10/22 >> negative COVID 10/22 >> negative Blood 10/22 >> negative Rt pleural fluid 10/26 >>  Sputum 10/26 >> rare  budding yeast    Assessment & Plan:   Active Problems:   Pressure injury of skin   Acute respiratory failure with hypoxia (HCC)   Protein-calorie malnutrition, severe   Palliative care by specialist   Goals of care, counseling/discussion   Acute hypoxic respiratory failure from HCAP and Rt empyema with trapped lung. - f/u CXR on 11/02 - bronchial hygiene - day 8/14 of ABx Currently only on 1 L of oxygen.  Wean down to room air as soon as we can.  Continue Zosyn.  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 - status post tube thoracostomy last month as well as pleural thrombolytics with fairly good results.  He now has a left-sided loculated effusion as well as changes concerning for an empyema on the right.  The right side was sampled and looks more consistent with just old blood with the thickened rind likely secondary to the thrombolytics administered.  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 - f/u pleural fluid cultures - chest tube removed 10/27  History of CAD and aortic stenosis with acute systolic CHF after cardiac arrest: Echo done in July this year showed 70% ejection fraction but repeat echo during this hospitalization done on 09/18/2020 shows 45% ejection fraction with left ventricular hypokinesis.  Appears slightly volume overloaded.  He received IV Lasix intermittently during this hospitalization.  I ordered another dose of IV Lasix 40 mg yesterday.  He has had good diuresis since then.  Now that cardiology is on board, I will defer further decision about diuresis to them.  It appears that cardiology plans to do cardiac intervention.  This will be  done by his primary cardiologist Dr. Terrence Dupont.  Essential hypertension: Blood pressure now better.  We will continue Toprol-XL and amlodipine but hold hydralazine for now.  Pericardial effusion without tamponade: Asymptomatic.  Monitor for now.  AKI:  Resolved.  Anemia of chronic disease: Per chart review, it seems like his hemoglobin dropped to less than 7 on 09/20/2019 for which he received 2 unit of PRBC transfusion and since then his hemoglobin has remained stable.  Monitor daily.  DM type 2 poorly controlled with hyperglycemia/peripheral neuropathy: Recent hemoglobin A1c 6.0.  On oral hypoglycemics at home.  Hold them.  Blood sugar is still slightly elevated but much better than yesterday.  Continue Lantus 10 units and continue SSI.  Continue gabapentin.  Pressure wounds: Wound care on board.  Severe protein calorie malnutrition: We will consult nutrition.  Deconditioning: Consult PT OT.  Dysphagia: SLP on board.  Now on dysphagia diet 3.  DVT prophylaxis: enoxaparin (LOVENOX) injection 40 mg Start: 09/20/20 1415   Code Status: Full Code  Family Communication: None present at bedside.  Palliative care on board and having continued discussions with family.  Status is: Inpatient  Remains inpatient appropriate because:Inpatient level of care appropriate due to severity of illness   Dispo: The patient is from: SNF              Anticipated d/c is to: SNF              Anticipated d/c date is: 3 days              Patient currently is not medically stable to d/c.        Estimated body mass index is 17.79 kg/m as calculated from the following:   Height as of this encounter: _0  (1.753 m).   Weight as of this encounter: 54.7 kg.  Pressure Injury 08/27/20 Heel Left Deep Tissue Pressure Injury - Purple or maroon localized area of discolored intact skin or blood-filled blister due to damage of underlying soft tissue from pressure and/or shear. (Active)  08/27/20 1033  Location: Heel  Location Orientation: Left  Staging: Deep Tissue Pressure Injury - Purple or maroon localized area of discolored intact skin or blood-filled blister due to damage of underlying soft tissue from pressure and/or shear.  Wound Description  (Comments):   Present on Admission: Yes     Pressure Injury 09/18/20 Buttocks Left Stage 2 -  Partial thickness loss of dermis presenting as a shallow open injury with a red, pink wound bed without slough. (Active)  09/18/20 1000  Location: Buttocks  Location Orientation: Left  Staging: Stage 2 -  Partial thickness loss of dermis presenting as a shallow open injury with a red, pink wound bed without slough.  Wound Description (Comments):   Present on Admission:      Pressure Injury 09/18/20 Buttocks Right Stage 2 -  Partial thickness loss of dermis presenting as a shallow open injury with a red, pink wound bed without slough. (Active)  09/18/20 1000  Location: Buttocks  Location Orientation: Right  Staging: Stage 2 -  Partial thickness loss of dermis presenting as a shallow open injury with a red, pink wound bed without slough.  Wound Description (Comments):   Present on Admission:      Nutritional status:  Nutrition Problem: Severe Malnutrition Etiology: acute illness (COVID PNA, aspiration PNA)   Signs/Symptoms: moderate fat depletion, severe muscle depletion   Interventions: Ensure Enlive (each supplement provides 350kcal and 20 grams of protein)  Consultants:   Consulting cardiology today  PCCM on board  Procedures:  ETT 10/26 >> 10/28 Lt IJ CVL 10/26 >> 10/26 Rt [place] CVL 10/26 >>  Rt pig tail catheter 10/26 >> 10/27  Antimicrobials:   Antimicrobials:  Cefepime 10/22 >> 10/26  Vancomycin 10/26 Zosyn 10/26 >>  Anti-infectives (From admission, onward)   Start     Dose/Rate Route Frequency Ordered Stop   09/19/20 1600  vancomycin (VANCOCIN) IVPB 1000 mg/200 mL premix  Status:  Discontinued        1,000 mg 200 mL/hr over 60 Minutes Intravenous Every 24 hours 09/18/20 1423 09/20/20 0833   09/18/20 1600  vancomycin (VANCOREADY) IVPB 1250 mg/250 mL        1,250 mg 166.7 mL/hr over 90 Minutes Intravenous  Once 09/18/20 1423 09/18/20 1916   09/18/20 1515   piperacillin-tazobactam (ZOSYN) IVPB 3.375 g        3.375 g 12.5 mL/hr over 240 Minutes Intravenous Every 8 hours 09/18/20 1423 09/28/20 1359   09/16/20 1500  vancomycin (VANCOCIN) IVPB 1000 mg/200 mL premix  Status:  Discontinued        1,000 mg 200 mL/hr over 60 Minutes Intravenous Every 24 hours 09/15/20 1359 09/16/20 0802   09/15/20 1800  vancomycin (VANCOCIN) IVPB 1000 mg/200 mL premix  Status:  Discontinued        1,000 mg 200 mL/hr over 60 Minutes Intravenous Every 24 hours 09/14/20 1726 09/15/20 1359   09/15/20 1430  vancomycin (VANCOREADY) IVPB 1500 mg/300 mL        1,500 mg 150 mL/hr over 120 Minutes Intravenous  Once 09/15/20 1359 09/15/20 1710   09/14/20 2200  ceFEPIme (MAXIPIME) 2 g in sodium chloride 0.9 % 100 mL IVPB  Status:  Discontinued        2 g 200 mL/hr over 30 Minutes Intravenous Every 12 hours 09/14/20 1726 09/18/20 1309   09/14/20 1730  vancomycin (VANCOREADY) IVPB 1500 mg/300 mL  Status:  Discontinued        1,500 mg 150 mL/hr over 120 Minutes Intravenous  Once 09/14/20 1726 09/15/20 1359         Subjective: Seen and examined.  No complaints.  Denied any shortness of breath.  Objective: Vitals:   09/23/20 0433 09/23/20 0500 09/23/20 0736 09/23/20 1127  BP: (!) 135/56  127/73 (!) 157/68  Pulse: (!) 57 61 65   Resp: _0 Temp: 98.3 F (36.8 C)  98.2 F (36.8 C) 98.7 F (37.1 C)  TempSrc: Axillary  Oral Oral  SpO2: 100% 100% 100% 99%  Weight:  54.7 kg    Height:        Intake/Output Summary (Last 24 hours) at 09/23/2020 1257 Last data filed at 09/23/2020 0600 Gross per 24 hour  Intake 1237.97 ml  Output 2000 ml  Net -762.03 ml   Filed Weights   09/21/20 0500 09/22/20 0500 09/23/20 0500  Weight: 63 kg 56.6 kg 54.7 kg    Examination:  General exam: Appears calm and comfortable  Respiratory system: Rhonchi bibasilar. Respiratory effort normal. Cardiovascular system: S1 & S2 heard, RRR. No JVD, murmurs, rubs, gallops or clicks. No  pedal edema. Gastrointestinal system: Abdomen is nondistended, soft and nontender. No organomegaly or masses felt. Normal bowel sounds heard. Central nervous system: Alert and oriented. No focal neurological deficits. Extremities: Symmetric 5 x 5 power. Skin: No rashes, lesions or ulcers.  Psychiatry: Judgement and insight appear normal. Mood & affect appropriate.    Data Reviewed: I  have personally reviewed following labs and imaging studies  CBC: Recent Labs  Lab 09/17/20 0301 09/17/20 0301 09/18/20 0357 09/18/20 1224 09/19/20 0731 09/19/20 1955 09/20/20 0500 09/22/20 0656 09/23/20 0325  WBC 14.1*   < > 12.7*  --  13.8*  --  13.4* 11.8* 11.9*  NEUTROABS 10.5*  --  10.1*  --  9.8*  --  9.6*  --  8.2*  HGB 8.2*   < > 7.6*   < > 6.1* 9.0* 8.6* 9.5* 9.2*  HCT 26.9*   < > 24.8*   < > 19.6* 27.4* 26.5* 30.2* 29.2*  MCV 90.6   < > 90.2  --  86.7  --  83.6 86.0 85.9  PLT 312   < > 292  --  269  --  267 241 224   < > = values in this interval not displayed.   Basic Metabolic Panel: Recent Labs  Lab 09/19/20 0710 09/19/20 1955 09/20/20 0500 09/20/20 1700 09/20/20 1955 09/21/20 0500 09/22/20 0656 09/23/20 0325  NA 142  --  143  --   --  145 143 141  K 4.3  --  3.2*  --   --  3.7 3.5 3.4*  CL 107  --  108  --   --  105 100 98  CO2 21*  --  25  --   --  30 29 33*  GLUCOSE 121*  --  227*  --   --  136* 129* 171*  BUN 69*  --  57*  --   --  34* 25* 21  CREATININE 1.88*  --  1.58*  --   --  1.32* 1.19 1.18  CALCIUM 8.8*  --  8.6*  --   --  8.5* 8.6* 8.5*  MG 2.3 2.2 2.0 1.7  --   --   --  1.4*  PHOS 2.5 2.1* 1.5* 5.6* 4.5  --   --   --    GFR: Estimated Creatinine Clearance: 41.2 mL/min (by C-G formula based on SCr of 1.18 mg/dL). Liver Function Tests: Recent Labs  Lab 09/23/20 0325  AST 14*  ALT 41  ALKPHOS 152*  BILITOT 0.5  PROT 6.6  ALBUMIN 1.8*   No results for input(s): LIPASE, AMYLASE in the last 168 hours. No results for input(s): AMMONIA in the last 168  hours. Coagulation Profile: No results for input(s): INR, PROTIME in the last 168 hours. Cardiac Enzymes: No results for input(s): CKTOTAL, CKMB, CKMBINDEX, TROPONINI in the last 168 hours. BNP (last 3 results) No results for input(s): PROBNP in the last 8760 hours. HbA1C: No results for input(s): HGBA1C in the last 72 hours. CBG: Recent Labs  Lab 09/22/20 1122 09/22/20 1544 09/22/20 2156 09/23/20 0631 09/23/20 1205  GLUCAP 174* 179* 164* 116* 189*   Lipid Profile: No results for input(s): CHOL, HDL, LDLCALC, TRIG, CHOLHDL, LDLDIRECT in the last 72 hours. Thyroid Function Tests: No results for input(s): TSH, T4TOTAL, FREET4, T3FREE, THYROIDAB in the last 72 hours. Anemia Panel: No results for input(s): VITAMINB12, FOLATE, FERRITIN, TIBC, IRON, RETICCTPCT in the last 72 hours. Sepsis Labs: Recent Labs  Lab 09/17/20 0301 09/18/20 0357  PROCALCITON 0.70 1.36    Recent Results (from the past 240 hour(s))  Respiratory Panel by RT PCR (Flu A&B, Covid) - Nasopharyngeal Swab     Status: None   Collection Time: 09/14/20  5:39 PM   Specimen: Nasopharyngeal Swab  Result Value Ref Range Status   SARS Coronavirus 2 by RT PCR  NEGATIVE NEGATIVE Final    Comment: (NOTE) SARS-CoV-2 target nucleic acids are NOT DETECTED.  The SARS-CoV-2 RNA is generally detectable in upper respiratoy specimens during the acute phase of infection. The lowest concentration of SARS-CoV-2 viral copies this assay can detect is 131 copies/mL. A negative result does not preclude SARS-Cov-2 infection and should not be used as the sole basis for treatment or other patient management decisions. A negative result may occur with  improper specimen collection/handling, submission of specimen other than nasopharyngeal swab, presence of viral mutation(s) within the areas targeted by this assay, and inadequate number of viral copies (<131 copies/mL). A negative result must be combined with clinical observations,  patient history, and epidemiological information. The expected result is Negative.  Fact Sheet for Patients:  PinkCheek.be  Fact Sheet for Healthcare Providers:  GravelBags.it  This test is no t yet approved or cleared by the [place] FDA and  has been authorized for detection and/or diagnosis of SARS-CoV-2 by FDA under an Emergency Use Authorization (EUA). This EUA will remain  in effect (meaning this test can be used) for the duration of the COVID-19 declaration under Section 564(b)(1) of the Act, 21 U.S.C. section 360bbb-3(b)(1), unless the authorization is terminated or revoked sooner.     Influenza A by PCR NEGATIVE NEGATIVE Final   Influenza B by PCR NEGATIVE NEGATIVE Final    Comment: (NOTE) The Xpert Xpress SARS-CoV-2/FLU/RSV assay is intended as an aid in  the diagnosis of influenza from Nasopharyngeal swab specimens and  should not be used as a sole basis for treatment. Nasal washings and  aspirates are unacceptable for Xpert Xpress SARS-CoV-2/FLU/RSV  testing.  Fact Sheet for Patients: PinkCheek.be  Fact Sheet for Healthcare Providers: GravelBags.it  This test is not yet approved or cleared by the [place] FDA and  has been authorized for detection and/or diagnosis of SARS-CoV-2 by  FDA under an Emergency Use Authorization (EUA). This EUA will remain  in effect (meaning this test can be used) for the duration of the  Covid-19 declaration under Section 564(b)(1) of the Act, 21  U.S.C. section 360bbb-3(b)(1), unless the authorization is  terminated or revoked. Performed at [facility], [place] [address], [place], [place] [address]   Culture, blood (Routine X 2) w Reflex to ID Panel     Status: None   Collection Time: 09/14/20  8:13 PM   Specimen: BLOOD RIGHT HAND  Result Value Ref Range Status   Specimen Description BLOOD RIGHT HAND   Final   Special Requests   Final    BOTTLES DRAWN AEROBIC ONLY Blood Culture adequate volume   Culture   Final    NO GROWTH 5 DAYS Performed at [facility], [place] [address], [place], [place] [address]    Report Status 09/19/2020 FINAL  Final  Culture, blood (Routine X 2) w Reflex to ID Panel     Status: None   Collection Time: 09/14/20  8:24 PM   Specimen: BLOOD LEFT HAND  Result Value Ref Range Status   Specimen Description BLOOD LEFT HAND  Final   Special Requests   Final    BOTTLES DRAWN AEROBIC ONLY Blood Culture results may not be optimal due to an inadequate volume of blood received in culture bottles   Culture   Final    NO GROWTH 5 DAYS Performed at [facility], [place] [address] [place], [place] [address]    Report Status 09/19/2020 FINAL  Final  Culture, respiratory (  non-expectorated)     Status: None   Collection Time: 09/18/20 12:23 PM   Specimen: Bronchoalveolar Lavage; Respiratory  Result Value Ref Range Status   Specimen Description BRONCHIAL ALVEOLAR LAVAGE  Final   Special Requests NONE  Final   Gram Stain   Final    RARE WBC PRESENT, PREDOMINANTLY PMN RARE BUDDING YEAST SEEN    Culture   Final    RARE Normal respiratory flora-no Staph aureus or Pseudomonas seen Performed at [facility], [address] [place], [place] [address]    Report Status 09/20/2020 FINAL  Final  Fungus Culture With Stain     Status: None (Preliminary result)   Collection Time: 09/18/20 12:23 PM   Specimen: Bronchial Alveolar Lavage  Result Value Ref Range Status   Fungus Stain Final report  Final    Comment: (NOTE) Performed At: [facility] [place], [place] [id number] Rush Farmer MD [id number]    Fungus (Mycology) Culture PENDING  Incomplete   Fungal Source BRONCHIAL ALVEOLAR LAVAGE  Final    Comment: Performed at [facility], [place] [address], [place], [place] [address]  Fungus Culture Result     Status: None   Collection  Time: 09/18/20 12:23 PM  Result Value Ref Range Status   Result 1 Comment  Final    Comment: (NOTE) KOH/Calcofluor preparation:  no fungus observed. Performed At: [facility] [place], [place] [id number] Rush Farmer MD [id number]   Fungus Culture With Stain     Status: None (Preliminary result)   Collection Time: 09/18/20  4:01 PM   Specimen: Pleural, Right  Result Value Ref Range Status   Fungus Stain Final report  Final    Comment: (NOTE) Performed At: [facility] [place], [place] [id number] Rush Farmer MD [id number]    Fungus (Mycology) Culture PENDING  Incomplete   Fungal Source FLUID  Final    Comment: RIGHT PLEURAL Performed at [facility], [place] [address] [place], [place] [address]   Gram stain     Status: None   Collection Time: 09/18/20  4:01 PM   Specimen: Fluid  Result Value Ref Range Status   Specimen Description FLUID PLEURAL RIGHT  Final   Special Requests BOTTLES DRAWN AEROBIC AND ANAEROBIC  Final   Gram Stain   Final    FEW WBC PRESENT,BOTH PMN AND MONONUCLEAR NO ORGANISMS SEEN Performed at [facility], [place] [address], [place],  [address]    Report Status 09/18/2020 FINAL  Final  Culture, body fluid-bottle     Status: None   Collection Time: 09/18/20  4:01 PM   Specimen: Fluid  Result Value Ref Range Status   Specimen Description FLUID PLEURAL RIGHT  Final   Special Requests NONE  Final   Culture   Final    NO GROWTH 5 DAYS Performed at [place] [address] [place],  [address]    Report Status 09/23/2020 FINAL  Final  Fungus Culture Result     Status: None   Collection Time: 09/18/20  4:01 PM  Result Value Ref Range Status   Result 1 Comment  Final    Comment: (NOTE) KOH/Calcofluor preparation:  no fungus observed. Performed At: [facility] [place], [place] [id number] Rush Farmer MD [id number]   MRSA PCR Screening      Status: None   Collection Time: 09/19/20 10:25 AM   Specimen: Nasal Mucosa; Nasopharyngeal  Result Value Ref Range Status   MRSA by PCR NEGATIVE NEGATIVE Final    Comment:        The GeneXpert MRSA Assay (FDA approved for NASAL specimens only), is one component of a comprehensive MRSA colonization surveillance program. It is not intended to diagnose MRSA infection nor to guide or monitor treatment for MRSA infections. Performed at [facility], [place] [address] [place],  [address]       Radiology Studies: DG Chest Port 1 View  Result Date: 09/21/2020 CLINICAL DATA:  Hypoxia. EXAM: PORTABLE CHEST 1 VIEW COMPARISON:  09/20/2020 FINDINGS: Endotracheal tube and nasogastric tube have been removed. RIGHT subclavian line has been removed. Heart is enlarged in also accentuated by technique, stable in appearance. There are patchy opacities throughout the lungs bilaterally. There are bilateral pleural effusions. Lucency at the LATERAL RIGHT lung base appears stable and likely represents known empyema. There is increased atelectasis or early infiltrate in the LEFT LATERAL lung base. IMPRESSION: 1. Persistent bilateral infiltrates and pleural effusions. 2. Stable appearance of RIGHT empyema. 3. Increased atelectasis or early infiltrate in the LEFT LATERAL lung base. Electronically Signed   By: Nolon Nations M.D.   On: 09/21/2020 18:33   DG Swallowing Func-Speech Pathology  Result Date: 09/23/2020 Objective Swallowing Evaluation: Type of Study: MBS-Modified Barium Swallow Study  Patient Details Name: Jeremiah [name] MRN: [id number] Date of Birth: January 30, 1946 Today's Date: 09/23/2020 Time: SLP Start Time (ACUTE ONLY): 1059 -SLP Stop Time (ACUTE ONLY): 1120 SLP Time Calculation (min) (ACUTE ONLY): 21 min Past Medical History: Past Medical History: Diagnosis Date . Amputee  . Aortic atherosclerosis ([place])  . Arthritis   "joints; shoulders, knees, hands, back" (05/21/2018) . Atherosclerosis  of coronary artery  . C. difficile diarrhea 04/2018 . Diastolic CHF ([place])  . Diverticulosis  . High cholesterol  . History of gout  . Hypertension  . IDA (iron deficiency anemia)   from referral Dr Daphene Jaeger . Internal hemorrhoids  . Peripheral vascular disease ([place])  . Pneumonia   "couple times" (05/21/2018) . Sleep apnea   "has mask; won't use" (05/21/2018) . Type II diabetes mellitus ([place])  Past Surgical History: Past Surgical History: Procedure Laterality Date . ABDOMINAL AORTOGRAM W/LOWER EXTREMITY N/A 11/01/2018  Procedure: ABDOMINAL AORTOGRAM W/LOWER EXTREMITY;  Surgeon: Lorretta Harp, MD;  Location: [facility];  Service: Cardiovascular;  Laterality: N/A; . ABDOMINAL AORTOGRAM W/LOWER EXTREMITY Left 06/20/2020  Procedure: ABDOMINAL AORTOGRAM W/LOWER EXTREMITY;  Surgeon: Marty Heck, MD;  Location: [facility];  Service: Cardiovascular;  Laterality: Left; . AMPUTATION Right 11/09/2018  Procedure: RIGHT - AMPUTATION ABOVE KNEE;  Surgeon: Waynetta Sandy, MD;  Location: [facility];  Service: Vascular;  Laterality: Right; . CATARACT EXTRACTION W/ INTRAOCULAR LENS  IMPLANT, BILATERAL Bilateral  . CHOLECYSTECTOMY  05/21/2018  ATTEMPTED LAPAROSCOPIC CHOLECYSTECTOMY, OPEN DRAINAGE OF GALLBLADDER WITH BIOPSY . CHOLECYSTECTOMY N/A 05/21/2018  Procedure: ATTEMPTED LAPAROSCOPIC CHOLECYSTECTOMY, OPEN DRAINAGE OF GALLBLADDER WITH BIOPSY;  Surgeon: Jovita Kussmaul, MD;  Location: [facility];  Service: General;  Laterality: N/A; . COLONOSCOPY W/ POLYPECTOMY   . COLONOSCOPY WITH PROPOFOL N/A 09/16/2018  Procedure: COLONOSCOPY WITH PROPOFOL;  Surgeon: Wilford Corner, MD;  Location: [facility];  Service: Endoscopy;  Laterality: N/A; . 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 FLEXIBLE SIGMOIDOSCOPY N/A 04/20/2020  Procedure: FLEXIBLE SIGMOIDOSCOPY;  Surgeon: Irving Copas., MD;   Location: [facility];  Service: Gastroenterology;  Laterality: N/A;  Fecal Disimpaction . IR CATHETER TUBE CHANGE  04/14/2018 . IR CHOLANGIOGRAM EXISTING TUBE  03/17/2018 . IR PERC CHOLECYSTOSTOMY  01/31/2018 . IR RADIOLOGIST EVAL & MGMT  03/02/2018 . PERIPHERAL VASCULAR INTERVENTION Left 06/20/2020  Procedure: PERIPHERAL VASCULAR INTERVENTION;  Surgeon: Marty Heck, MD;  Location: [facility];  Service: Cardiovascular;  Laterality: Left;  4 SFA STENTS HPI: 74 yo male resident of [facility] with medical history significant of peripheral vascular disease, s/p right AKI, chronic diastolic heart failure, hypertension type 2 diabetes mellitus presented with dyspnea and hypoxia was in hospital from 08/09/20 to 08/21/20 with COVID 19 and aspiration pneumonia complicated by Rt pleural effusion treated with pig tail catheter drainage.  He was then in hospital from 08/26/20 to 08/30/20 with HCAP.  He presented to ER again on 09/14/20 with dyspnea from HCAP and Rt pleural effusion. Pt evaluated by SLP on 10/23 and found to have no signs of dysphagia. On 10/26 pt developed respiratory distress and cardiac arrest and was intubated until 10/28. Again on 10/29 pt had some respiratory distress. There was concern for aspiration.  Subjective: pleasant, son in room with him, upset he is not able to go to wife's funeral today Assessment / Plan / Recommendation CHL IP CLINICAL IMPRESSIONS 09/23/2020 Clinical Impression Pt presented with pharyngeal dysphagia characterized by mildly reduced anterior laryngeal movement and reduced duration of cricopharyngeal relaxation which resulted in pyriform sinus residue across consistencies.No functional benefit was noted with postural modifications. A liquid wash reduced, but did not eliminate, residue and pt was unable to demonstrate a mendelsohn maneuver. Penetration (PAS 2) was noted with consecutive swallows of thin liquids, but this is considered to be WNL and no instances of  aspiration were demonstrated. Pt denied sensation of pharyngeal residue throughout the study. Due to the pt's recent episode of "choking", the potential impact of pyriform sinus residue on safety is considered. A dysphagia 3 diet with thin liquids is recommended at this time with observance of swallowing precautions. SLP will follow for diet tolerance.  SLP Visit Diagnosis Dysphagia, unspecified (R13.10) Attention and concentration deficit following -- Frontal lobe and executive function deficit following -- Impact on safety and function Moderate aspiration risk   CHL IP TREATMENT RECOMMENDATION 09/23/2020 Treatment Recommendations Therapy as outlined in treatment plan below   Prognosis 09/23/2020 Prognosis for Safe Diet Advancement Good Barriers to Reach Goals -- Barriers/Prognosis Comment -- CHL IP DIET RECOMMENDATION 09/23/2020 SLP Diet Recommendations Dysphagia 3 (Mech soft) solids;Thin liquid Liquid Administration via Cup;Straw Medication Administration Whole meds with liquid Compensations Slow rate;Small sips/bites;Follow solids with liquid Postural Changes Seated upright at 90 degrees;Remain semi-upright after after feeds/meals (Comment)   CHL IP OTHER RECOMMENDATIONS 09/23/2020 Recommended Consults -- Oral Care Recommendations Oral care BID Other Recommendations --   CHL IP FOLLOW UP RECOMMENDATIONS 09/23/2020 Follow up Recommendations Skilled Nursing facility   [facility] IP FREQUENCY AND DURATION 09/23/2020 Speech Therapy Frequency (ACUTE ONLY) min 2x/week Treatment Duration 2 weeks      CHL IP ORAL PHASE 09/23/2020 Oral Phase WFL Oral - Pudding Teaspoon -- Oral - Pudding Cup -- Oral - Honey Teaspoon -- Oral - Honey Cup -- Oral - Nectar Teaspoon -- Oral - Nectar Cup -- Oral - Nectar Straw -- Oral - Thin Teaspoon -- Oral - Thin Cup -- Oral - Thin Straw -- Oral - Puree -- Oral - Mech Soft -- Oral - Regular -- Oral - Multi-Consistency -- Oral - Pill -- Oral Phase - Comment --  CHL IP PHARYNGEAL PHASE 09/23/2020  Pharyngeal Phase Impaired Pharyngeal- Pudding Teaspoon -- Pharyngeal -- Pharyngeal- Pudding Cup -- Pharyngeal -- Pharyngeal- Honey Teaspoon -- Pharyngeal -- Pharyngeal- Honey Cup -- Pharyngeal -- Pharyngeal- Nectar Teaspoon -- Pharyngeal -- Pharyngeal- Nectar Cup -- Pharyngeal -- Pharyngeal- Nectar Straw -- Pharyngeal -- Pharyngeal- Thin Teaspoon -- Pharyngeal -- Pharyngeal- Thin Cup -- Pharyngeal -- Pharyngeal- Thin Straw -- Pharyngeal -- Pharyngeal- Puree -- Pharyngeal -- Pharyngeal- Mechanical Soft -- Pharyngeal -- Pharyngeal- Regular -- Pharyngeal -- Pharyngeal- Multi-consistency -- Pharyngeal -- Pharyngeal- Pill -- Pharyngeal -- Pharyngeal Comment --  CHL IP CERVICAL ESOPHAGEAL PHASE 09/23/2020 Cervical Esophageal Phase Reduced duration of cricopharyngeal relaxation but otherwise WNL Pudding Teaspoon -- Pudding Cup -- Honey Teaspoon -- Honey Cup -- Nectar Teaspoon -- Nectar Cup -- Nectar Straw -- Thin Teaspoon -- Thin Cup -- Thin Straw -- Puree -- Mechanical Soft -- Regular -- Multi-consistency -- Pill -- Cervical Esophageal Comment -- Shanika I. Hardin Negus, [place], [place] Office number [phone] Pager [phone] Horton Marshall 09/23/2020, 12:52 PM               Scheduled Meds: . sodium chloride   Intravenous Once  . amLODipine  10 mg Oral Daily  . aspirin  81 mg Oral Daily  . atorvastatin  20 mg Oral q1800  . brimonidine  1 drop Both Eyes BID  . Chlorhexidine Gluconate Cloth  6 each Topical Daily  . enoxaparin (LOVENOX) injection  40 mg Subcutaneous Q24H  . feeding supplement  237 mL Oral TID BM  . ferrous sulfate  325 mg Oral BID WC  . gabapentin  300 mg Oral BID  . insulin aspart  0-15 Units Subcutaneous TID WC  . insulin aspart  0-5 Units Subcutaneous QHS  . insulin glargine  10 Units Subcutaneous Daily  . liver oil-zinc oxide   Topical BID  . mouth rinse  15 mL Mouth Rinse BID  . metoprolol succinate  25 mg Oral Daily  . nystatin  5 mL Oral QID  .  sodium chloride flush  10-40 mL Intracatheter Q12H  . sodium chloride flush  3 mL Intravenous Q12H  . timolol  1 drop Both Eyes BID  . cyanocobalamin  1,000 mcg Oral Daily   Continuous Infusions: . sodium chloride Stopped (09/18/20 2157)  . magnesium sulfate bolus IVPB 2 g (09/23/20 1224)  . piperacillin-tazobactam (ZOSYN)  IV 3.375 g (09/23/20 0519)     LOS: 9 days   Time spent: 30 minutes   Darliss Cheney, MD [facility]  09/23/2020, 12:57 PM   To contact the attending provider between 7A-7P or the covering provider during after hours 7P-7A, please log into the web site [email or web address].

## 2020-09-24 ENCOUNTER — Inpatient Hospital Stay (HOSPITAL_COMMUNITY): Payer: Medicare Other

## 2020-09-24 DIAGNOSIS — Z7189 Other specified counseling: Secondary | ICD-10-CM | POA: Diagnosis not present

## 2020-09-24 DIAGNOSIS — I469 Cardiac arrest, cause unspecified: Secondary | ICD-10-CM | POA: Diagnosis not present

## 2020-09-24 DIAGNOSIS — J9601 Acute respiratory failure with hypoxia: Secondary | ICD-10-CM | POA: Diagnosis not present

## 2020-09-24 LAB — CBC WITH DIFFERENTIAL/PLATELET
Abs Immature Granulocytes: 0.04 10*3/uL (ref 0.00–0.07)
Basophils Absolute: 0.1 10*3/uL (ref 0.0–0.1)
Basophils Relative: 1 %
Eosinophils Absolute: 0.5 10*3/uL (ref 0.0–0.5)
Eosinophils Relative: 5 %
HCT: 29.9 % — ABNORMAL LOW (ref 39.0–52.0)
Hemoglobin: 9.5 g/dL — ABNORMAL LOW (ref 13.0–17.0)
Immature Granulocytes: 0 %
Lymphocytes Relative: 16 %
Lymphs Abs: 1.9 10*3/uL (ref 0.7–4.0)
MCH: 27.3 pg (ref 26.0–34.0)
MCHC: 31.8 g/dL (ref 30.0–36.0)
MCV: 85.9 fL (ref 80.0–100.0)
Monocytes Absolute: 1.6 10*3/uL — ABNORMAL HIGH (ref 0.1–1.0)
Monocytes Relative: 13 %
Neutro Abs: 7.6 10*3/uL (ref 1.7–7.7)
Neutrophils Relative %: 65 %
Platelets: 236 10*3/uL (ref 150–400)
RBC: 3.48 MIL/uL — ABNORMAL LOW (ref 4.22–5.81)
RDW: 17.9 % — ABNORMAL HIGH (ref 11.5–15.5)
WBC: 11.6 10*3/uL — ABNORMAL HIGH (ref 4.0–10.5)
nRBC: 0 % (ref 0.0–0.2)

## 2020-09-24 LAB — GLUCOSE, CAPILLARY
Glucose-Capillary: 139 mg/dL — ABNORMAL HIGH (ref 70–99)
Glucose-Capillary: 189 mg/dL — ABNORMAL HIGH (ref 70–99)
Glucose-Capillary: 216 mg/dL — ABNORMAL HIGH (ref 70–99)
Glucose-Capillary: 184 mg/dL — ABNORMAL HIGH (ref 70–99)

## 2020-09-24 LAB — COMPREHENSIVE METABOLIC PANEL
ALT: 33 U/L (ref 0–44)
AST: 15 U/L (ref 15–41)
Albumin: 1.7 g/dL — ABNORMAL LOW (ref 3.5–5.0)
Alkaline Phosphatase: 121 U/L (ref 38–126)
Anion gap: 9 (ref 5–15)
BUN: 20 mg/dL (ref 8–23)
CO2: 32 mmol/L (ref 22–32)
Calcium: 8.5 mg/dL — ABNORMAL LOW (ref 8.9–10.3)
Chloride: 99 mmol/L (ref 98–111)
Creatinine, Ser: 1.15 mg/dL (ref 0.61–1.24)
GFR, Estimated: >60 mL/min (ref >60)
Glucose, Bld: 55 mg/dL — ABNORMAL LOW (ref 70–99)
Potassium: 3.8 mmol/L (ref 3.5–5.1)
Sodium: 140 mmol/L (ref 135–145)
Total Bilirubin: 0.7 mg/dL (ref 0.3–1.2)
Total Protein: 6.6 g/dL (ref 6.5–8.1)

## 2020-09-24 NOTE — TOC Progression Note (Signed)
Transition of Care [facility]) - Progression Note    Patient Details  Name: Jeremiah [name] MRN: [id number] Date of Birth: 1946/04/19  Transition of Care [facility]) CM/SW Contact  Eduard Roux, [place] Phone Number: 09/24/2020, 2:19 PM  Clinical Narrative:     Patient is from [place] and is expected to return. [facility] has confirmed availability - CSW informed of anticipated discharge tomorrow - pending insurance auth and covid test results.  CSW started insurance authorization today - reference # [id number]  MD updated-covid test requested  Antony Blackbird, MSW, LCSWA Clinical Social Worker   Expected Discharge Plan: Skilled Nursing Facility Barriers to Discharge: Continued Medical Work up  Expected Discharge Plan and Services Expected Discharge Plan: Skilled Nursing Facility     Post Acute Care Choice: Skilled Nursing Facility Living arrangements for the past 2 months: Single Family Home (Pt reports he was home prior to admission to [place].)                                       Social Determinants of Health (SDOH) Interventions    Readmission Risk Interventions Readmission Risk Prevention Plan 08/13/2020 07/08/2018 07/08/2018  Transportation Screening Complete - Complete  Medication Review [employer]) Referral to Pharmacy - Complete  PCP or Specialist appointment within 3-5 days of discharge Complete - Not Complete  HRI or Home Care Consult Complete (No Data) Complete  SW Recovery Care/Counseling Consult Complete Patient refused Not Complete  Palliative Care Screening Not Applicable - Not Complete  Comments - - (No Data)  Medication Reconcilation (Pharmacy) - - Not Complete  Skilled Nursing Facility Complete - Patient refused  Some recent data might be hidden

## 2020-09-24 NOTE — Progress Notes (Signed)
Pharmacy Antibiotic Note  Jeremiah [name] is a 74 y.o. male  with pneumonia.  Pharmacy has been consulted for Zosyn dosing  -WBC= 11.6, afebrile, SCr= 1.1, CrCl ~ 40 -stop date 11/5    Plan: Zosyn 3.375g IV q8h (4 hour infusion).  Stop date in for 11/5  Will sign off. Please contact pharmacy with any other needs.  Thank you  Harland German, PharmD Clinical Pharmacist **Pharmacist phone directory can now be found on [email or web address] ([email or web address]).  Listed under [facility].

## 2020-09-24 NOTE — Progress Notes (Signed)
Results for JAYRO, MCMATH (MRN [id number]) as of 09/24/2020 05:43  Ref. Range 09/23/2020 22:34 09/23/2020 23:46  Glucose-Capillary Latest Ref Range: 70 - 99 mg/dL 60 (L) 70   Encouraged oral intake. Pt preferred to drink Ensure and apple juice after low blood glucose checked at bedtime at 22:34. Vitals remained stable. No acute distress. Neuro intact. Asymptomatic. We will continue to monitor.  Filiberto Pinks, RN

## 2020-09-24 NOTE — Progress Notes (Signed)
Subjective:  Patient denies any chest pain.  States breathing is improved  Objective:  Vital Signs in the last 24 hours: Temp:  [97.7 F (36.5 C)-98.8 F (37.1 C)] 98 F (36.7 C) (11/01 1145) Pulse Rate:  [57-72] 62 (11/01 1145) Resp:  [13-18] 16 (11/01 1145) BP: (107-139)/(50-87) 139/58 (11/01 1145) SpO2:  [91 %-99 %] 91 % (11/01 1145) Weight:  [56.5 kg] 56.5 kg (11/01 0500)  Intake/Output from previous day: 10/31 0701 - 11/01 0700 In: 912.9 [P.O.:650; IV Piggyback:262.9] Out: 900 [Urine:900] Intake/Output from this shift: Total I/O In: -  Out: 350 [Urine:350]  Physical Exam: Neck: no adenopathy, no carotid bruit, no JVD and supple, symmetrical, trachea midline Lungs: decreased breath sounds at bases with bilateral rhonchi noted Heart: regular rate and rhythm, S1, S2 normal and 2/6 systolic murmur noted Abdomen: soft, non-tender; bowel sounds normal; no masses,  no organomegaly Extremities: right AKA noted.  No clubbing, cyanosis or edema.  Left leg .  No clubbing, cyanosis or edema.  Left foot dressing noted  Lab Results: Recent Labs    09/23/20 0325 09/24/20 0142  WBC 11.9* 11.6*  HGB 9.2* 9.5*  PLT 224 236   Recent Labs    09/23/20 0325 09/24/20 0142  NA 141 140  K 3.4* 3.8  CL 98 99  CO2 33* 32  GLUCOSE 171* 55*  BUN 21 20  CREATININE 1.18 1.15   No results for input(s): TROPONINI in the last 72 hours.  Invalid input(s): CK, MB Hepatic Function Panel Recent Labs    09/24/20 0142  PROT 6.6  ALBUMIN 1.7*  AST 15  ALT 33  ALKPHOS 121  BILITOT 0.7   No results for input(s): CHOL in the last 72 hours. No results for input(s): PROTIME in the last 72 hours.  Imaging: Imaging results have been reviewed and DG Chest 2 View  Result Date: 09/24/2020 CLINICAL DATA:  Shortness of breath. EXAM: CHEST - 2 VIEW COMPARISON:  September 21, 2020. FINDINGS: Stable cardiomegaly. Probable central pulmonary vascular congestion is noted. No pneumothorax is noted.  Bilateral perihilar and basilar opacities are noted concerning for edema or atelectasis with associated pleural effusions. Bony thorax is unremarkable. IMPRESSION: Stable cardiomegaly with central pulmonary vascular congestion. Bilateral perihilar and basilar opacities are noted concerning for edema or atelectasis with associated pleural effusions. Electronically Signed   By: Lupita Raider M.D.   On: 09/24/2020 08:05   DG Swallowing Func-Speech Pathology  Result Date: 09/23/2020 Objective Swallowing Evaluation: Type of Study: MBS-Modified Barium Swallow Study  Patient Details Name: Jeremiah [name] MRN: [id number] Date of Birth: January 24, 1946 Today's Date: 09/23/2020 Time: SLP Start Time (ACUTE ONLY): 1059 -SLP Stop Time (ACUTE ONLY): 1120 SLP Time Calculation (min) (ACUTE ONLY): 21 min Past Medical History: Past Medical History: Diagnosis Date  Amputee   Aortic atherosclerosis (HCC)   Arthritis   "joints; shoulders, knees, hands, back" (05/21/2018)  Atherosclerosis of coronary artery   C. difficile diarrhea 04/2018  Diastolic CHF (HCC)   Diverticulosis   High cholesterol   History of gout   Hypertension   IDA (iron deficiency anemia)   from referral Dr Darleene Cleaver  Internal hemorrhoids   Peripheral vascular disease (HCC)   Pneumonia   "couple times" (05/21/2018)  Sleep apnea   "has mask; won't use" (05/21/2018)  Type II diabetes mellitus (HCC)  Past Surgical History: Past Surgical History: Procedure Laterality Date  ABDOMINAL AORTOGRAM W/LOWER EXTREMITY N/A 11/01/2018  Procedure: ABDOMINAL AORTOGRAM W/LOWER EXTREMITY;  Surgeon: Runell Gess, MD;  Location: [facility] IN[facility]ASIVE [facility];  Service: Cardiovascular;  Laterality: N/A;  ABDOMINAL AORTOGRAM W/LOWER EXTREMITY Left 06/20/2020  Procedure: ABDOMINAL AORTOGRAM W/LOWER EXTREMITY;  Surgeon: Cephus Shelling, MD;  Location: [facility] CV [facility];  Service: Cardiovascular;  Laterality: Left;  AMPUTATION Right 11/09/2018  Procedure: RIGHT - AMPUTATION ABOVE  KNEE;  Surgeon: Maeola Harman, MD;  Location[facility];  Service: Vascular;  Laterality: Right;  CATARACT EXTRACTION W/ INTRAOCULAR LENS  IMPLANT, BILATERAL Bilateral   CHOLECYSTECTOMY  05/21/2018  ATTEMPTED LAPAROSCOPIC CHOLECYSTECTOMY, OPEN DRAINAGE OF GALLBLADDER WITH BIOPSY  CHOLECYSTECTOMY N/A 05/21/2018  Procedure: ATTEMPTED LAPAROSCOPIC CHOLECYSTECTOMY, OPEN DRAINAGE OF GALLBLADDER WITH BIOPSY;  Surgeon: Griselda Miner, MD;  Location: [facility] OR;  Service: General;  Laterality: N/A;  COLONOSCOPY W/ POLYPECTOMY    COLONOSCOPY WITH PROPOFOL N/A 09/16/2018  Procedure: COLONOSCOPY WITH PROPOFOL;  Surgeon: Charlott Rakes, MD;  Location: [facility] ENDOSCOPY;  Service: Endoscopy;  Laterality: N/A;  FECAL TRANSPLANT N/A 09/16/2018  Procedure: FECAL TRANSPLANT;  Surgeon: Charlott Rakes, MD;  Location: [facility] ENDOSCOPY;  Service: Endoscopy;  Laterality: N/A;  FLEXIBLE SIGMOIDOSCOPY N/A 04/20/2020  Procedure: FLEXIBLE SIGMOIDOSCOPY;  Surgeon: Meridee Score Netty Starring., MD;  Location: [facility];  Service: Gastroenterology;  Laterality: N/A;  Fecal Disimpaction  IR CATHETER TUBE CHANGE  04/14/2018  IR CHOLANGIOGRAM EXISTING TUBE  03/17/2018  IR PERC CHOLECYSTOSTOMY  01/31/2018  IR RADIOLOGIST EVAL & MGMT  03/02/2018  PERIPHERAL VASCULAR INTERVENTION Left 06/20/2020  Procedure: PERIPHERAL VASCULAR INTERVENTION;  Surgeon: Cephus Shelling, MD;  Location: [facility];  Service: Cardiovascular;  Laterality: Left;  4 SFA STENTS HPI: 74 yo male resident of [facility] with medical history significant of peripheral vascular disease, s/p right AKI, chronic diastolic heart failure, hypertension type 2 diabetes mellitus presented with dyspnea and hypoxia was in hospital from 08/09/20 to 08/21/20 with COVID 19 and aspiration pneumonia complicated by Rt pleural effusion treated with pig tail catheter drainage.  He was then in hospital from 08/26/20 to 08/30/20 with HCAP.  He presented to ER again on 09/14/20 with  dyspnea from HCAP and Rt pleural effusion. Pt evaluated by SLP on 10/23 and found to have no signs of dysphagia. On 10/26 pt developed respiratory distress and cardiac arrest and was intubated until 10/28. Again on 10/29 pt had some respiratory distress. There was concern for aspiration.  Subjective: pleasant, son in room with him, upset he is not able to go to wife's funeral today Assessment / Plan / Recommendation CHL IP CLINICAL IMPRESSIONS 09/23/2020 Clinical Impression Pt presented with pharyngeal dysphagia characterized by mildly reduced anterior laryngeal movement and reduced duration of cricopharyngeal relaxation which resulted in pyriform sinus residue across consistencies.No functional benefit was noted with postural modifications. A liquid wash reduced, but did not eliminate, residue and pt was unable to demonstrate a mendelsohn maneuver. Penetration (PAS 2) was noted with consecutive swallows of thin liquids, but this is considered to be WNL and no instances of aspiration were demonstrated. Pt denied sensation of pharyngeal residue throughout the study. Due to the pt's recent episode of "choking", the potential impact of pyriform sinus residue on safety is considered. A dysphagia 3 diet with thin liquids is recommended at this time with observance of swallowing precautions. SLP will follow for diet tolerance.  SLP Visit Diagnosis Dysphagia, unspecified (R13.10) Attention and concentration deficit following -- Frontal lobe and executive function deficit following -- Impact on safety and function Moderate aspiration risk   CHL IP TREATMENT RECOMMENDATION 09/23/2020 Treatment Recommendations Therapy as outlined in treatment plan  below   Prognosis 09/23/2020 Prognosis for Safe Diet Advancement Good Barriers to Reach Goals -- Barriers/Prognosis Comment -- CHL IP DIET RECOMMENDATION 09/23/2020 SLP Diet Recommendations Dysphagia 3 (Mech soft) solids;Thin liquid Liquid Administration via Cup;Straw Medication  Administration Whole meds with liquid Compensations Slow rate;Small sips/bites;Follow solids with liquid Postural Changes Seated upright at 90 degrees;Remain semi-upright after after feeds/meals (Comment)   CHL IP OTHER RECOMMENDATIONS 09/23/2020 Recommended Consults -- Oral Care Recommendations Oral care BID Other Recommendations --   CHL IP FOLLOW UP RECOMMENDATIONS 09/23/2020 Follow up Recommendations Skilled Nursing facility   [facility] IP FREQUENCY AND DURATION 09/23/2020 Speech Therapy Frequency (ACUTE ONLY) min 2x/week Treatment Duration 2 weeks      CHL IP ORAL PHASE 09/23/2020 Oral Phase WFL Oral - Pudding Teaspoon -- Oral - Pudding Cup -- Oral - Honey Teaspoon -- Oral - Honey Cup -- Oral - Nectar Teaspoon -- Oral - Nectar Cup -- Oral - Nectar Straw -- Oral - Thin Teaspoon -- Oral - Thin Cup -- Oral - Thin Straw -- Oral - Puree -- Oral - Mech Soft -- Oral - Regular -- Oral - Multi-Consistency -- Oral - Pill -- Oral Phase - Comment --  CHL IP PHARYNGEAL PHASE 09/23/2020 Pharyngeal Phase Impaired Pharyngeal- Pudding Teaspoon -- Pharyngeal -- Pharyngeal- Pudding Cup -- Pharyngeal -- Pharyngeal- Honey Teaspoon -- Pharyngeal -- Pharyngeal- Honey Cup -- Pharyngeal -- Pharyngeal- Nectar Teaspoon -- Pharyngeal -- Pharyngeal- Nectar Cup -- Pharyngeal -- Pharyngeal- Nectar Straw -- Pharyngeal -- Pharyngeal- Thin Teaspoon -- Pharyngeal -- Pharyngeal- Thin Cup -- Pharyngeal -- Pharyngeal- Thin Straw -- Pharyngeal -- Pharyngeal- Puree -- Pharyngeal -- Pharyngeal- Mechanical Soft -- Pharyngeal -- Pharyngeal- Regular -- Pharyngeal -- Pharyngeal- Multi-consistency -- Pharyngeal -- Pharyngeal- Pill -- Pharyngeal -- Pharyngeal Comment --  CHL IP CERVICAL ESOPHAGEAL PHASE 09/23/2020 Cervical Esophageal Phase Reduced duration of cricopharyngeal relaxation but otherwise WNL Pudding Teaspoon -- Pudding Cup -- Honey Teaspoon -- Honey Cup -- Nectar Teaspoon -- Nectar Cup -- Nectar Straw -- Thin Teaspoon -- Thin Cup -- Thin Straw --  Puree -- Mechanical Soft -- Regular -- Multi-consistency -- Pill -- Cervical Esophageal Comment -- Shanika I. 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 Plan Continue present management as per primary team. Patient not a candidate for any invasive cardiac interventions, discussed with patient and agrees for medical management only.  LOS: 10 days    Rinaldo Cloud 09/24/2020, 1:07 PM

## 2020-09-24 NOTE — Progress Notes (Signed)
This is a progress note.   NAME:  Jeremiah [name], MRN:  [id number], DOB:  10/08/1946, LOS: 16 ADMISSION DATE:  09/14/2020, CONSULTATION DATE: 09/18/2020 REFERRING MD: Triad, CHIEF COMPLAINT: Status post arrest 09/18/2020  Brief History   74 yo male resident of [place] with dyspnea and hypoxia was in hospital from 08/09/20 to 08/21/20 with COVID 19 and aspiration pneumonia complicated by Rt pleural effusion treated with pig tail catheter drainage.  He was then in hospital from 08/26/20 to 08/30/20 with HCAP.  He presented to ER again on 09/14/20 with dyspnea from HCAP and Rt pleural effusion.  Developed respiratory leading to cardiac arrest on 10/26 and PCCM assumed care in ICU.  Past Medical History  DM type 2, HTN, CKD 3b, PAD s/p Rt AKA, Anemia, C diff, Diastolic CHF, Diverticulosis, HLD, Gout, OSA intolerant of CPAP  Significant Hospital Events   10/22 Admit 10/24 worsening hypoxia, needing Bipap 10/25 to IR for u/s assessment of pleural space >> not fluid pocket amenable to thoracentesis 10/26 Respiratory leading to cardiac arrest with ROSC in 10 minutes 10/27 Transfuse 2 units PRBC 10/28 Extubated  Consults:  10/23 Cardiothoracic surgery 10/24 IR  Procedures:  ETT 10/26 >> 10/28 Lt IJ CVL 10/26 >> 10/26 Rt [place] CVL 10/26 >>  Rt pig tail catheter 10/26 >> 10/27  Significant Diagnostic Tests:   CT chest 10/22 >> coronary calcification, no pericardial effusion, persistent subtotal collapse of LLL and total collapse of RML and RLL, complex gas and fluid collection in Rt pleural space with pleural thickening, partially loculated small Lt effusion  Bronchoscopy 10/26 >> removed mucus plug and clots  Echo 10/26 >> EF 40 to 45%, grade 2 DD, mod pericardial effusion w/o tamponade, mod AS  Micro Data:  MRSA PCR 10/22 >> negative COVID 10/22 >> negative Blood 10/22 >> negative Rt pleural fluid 10/26 >>  Sputum 10/26 >> rare budding yeast  Antimicrobials:  Cefepime 10/22 >>  10/26  Vancomycin 10/26 Zosyn 10/26 >>  Interim history/subjective:  Extubated on 10/28 and transferred to the hospitalist service.   Objective   Blood pressure 119/87, pulse 67, temperature 98.5 F (36.9 C), temperature source Oral, resp. rate 16, height _0  (1.753 m), weight 56.5 kg, SpO2 98 %.        Intake/Output Summary (Last 24 hours) at 09/24/2020 1126 Last data filed at 09/24/2020 0900 Gross per 24 hour  Intake 912.85 ml  Output 1250 ml  Net -337.15 ml   Filed Weights   09/22/20 0500 09/23/20 0500 09/24/20 0500  Weight: 56.6 kg 54.7 kg 56.5 kg    Physical Exam: General: Well-appearing, no acute distress HENT: [place], AT, OP clear, MMM Eyes: EOMI, no scleral icterus Respiratory: Clear to auscultation bilaterally.  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 Right pigtail removed on 10/27. Cultures negative. No pleural studies obtained. Acute on chronic right pleural effusion  Left-sided loculated effusion Hx of covid infection S/p right thoracostomy in September 2021 with good response to pleural thrombolytics. On this admission, left sided loculated effusion and concern for development of right empyema. Sampling of right side  consistent with old blood in setting of prior thrombolytic administration and thickened rind. Pleural culture and BAL negative. No evidence of empyema. Chronic effusion is secondary to trapped lung - Complete antibiotics for  7 day course - Bronchial hygiene - Will arrange for  outpatient follow-up with me. Plan for repeat CT chest in 2-3 months.  Acute hypoxemic respiratory failure secondary to above - Wean supplemental O2 for goal SpO2 >15%  Acute systolic CHF after cardiac arrest. Pericardial effusion without tamponade. Aortic stenosis, CAD, chronic diastolic CHF, PAD, HLD. CKD 3b. Anemia of critical illness and chronic disease. DM type 2 poorly controlled with hyperglycemia. 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 Pressure wounds. Severe protein calorie malnutrition. Deconditioning. - per primary team  Pulmonary will sign off. Please call for any questions or concerns.  Best practice:  Diet: carb modified DVT prophylaxis: Lovenox GI prophylaxis: not indicated Mobility: OOB to chair Code Status: Full Disposition: Per Primary  Labs:   CMP Latest Ref Rng & Units 09/24/2020 09/23/2020 09/22/2020  Glucose 70 - 99 mg/dL 55(L) 171(H) 129(H)  BUN 8 - 23 mg/dL 20 21 25(H)  Creatinine 0.61 - 1.24 mg/dL 1.15 1.18 1.19  Sodium 135 - 145 mmol/L 140 141 143  Potassium 3.5 - 5.1 mmol/L 3.8 3.4(L) 3.5  Chloride 98 - 111 mmol/L 99 98 100  CO2 22 - 32 mmol/L 32 33(H) 29  Calcium 8.9 - 10.3 mg/dL 8.5(L) 8.5(L) 8.6(L)  Total Protein 6.5 - 8.1 g/dL 6.6 6.6 -  Total Bilirubin 0.3 - 1.2 mg/dL 0.7 0.5 -  Alkaline Phos 38 - 126 U/L 121 152(H) -  AST 15 - 41 U/L 15 14(L) -  ALT 0 - 44 U/L 33 41 -    CBC Latest Ref Rng & Units 09/24/2020 09/23/2020 09/22/2020  WBC 4.0 - 10.5 K/uL 11.6(H) 11.9(H) 11.8(H)  Hemoglobin 13.0 - 17.0 g/dL 9.5(L) 9.2(L) 9.5(L)  Hematocrit 39 - 52 % 29.9(L) 29.2(L) 30.2(L)  Platelets 150 - 400 K/uL 236 224 241    ABG    Component Value Date/Time   PHART 7.390 09/18/2020 1224   PCO2ART 42.4 09/18/2020 1224   PO2ART 67 (L) 09/18/2020 1224   HCO3 25.7 09/18/2020 1224   TCO2 27 09/18/2020 1224   O2SAT 93.0 09/18/2020 1224    CBG (last 3)  Recent Labs    09/23/20 2234 09/23/20 2346 09/24/20 0628  GLUCAP 60* 70 139*   Signature:    Care Time: 35 min  Reviewed prior documentation, coordinating care and discussing medical diagnosis and plan with the patient/family. Imaging, labs and tests included in this note have been reviewed and interpreted independently by me.  Rodman Pickle, M.D. [facility] Pulmonary/Critical Care Medicine 09/24/2020 11:26 AM

## 2020-09-24 NOTE — Progress Notes (Signed)
Inpatient Diabetes Program Recommendations  AACE/ADA: New Consensus Statement on Inpatient Glycemic Control (2015)  Target Ranges:  Prepandial:   less than 140 mg/dL      Peak postprandial:   less than 180 mg/dL (1-2 hours)      Critically ill patients:  140 - 180 mg/dL   Lab Results  Component Value Date   GLUCAP 189 (H) 09/24/2020   HGBA1C 6.0 (H) 08/10/2020    Review of Glycemic Control Results for HANZEL, PIZZO (MRN [id number]) as of 09/24/2020 11:57  Ref. Range 09/23/2020 17:14 09/23/2020 22:34 09/23/2020 23:46 09/24/2020 06:28 09/24/2020 11:47  Glucose-Capillary Latest Ref Range: 70 - 99 mg/dL 790 (H) 60 (L) 70 240 (H) 189 (H)   Diabetes history:  DM2 Outpatient Diabetes medications:  Amaryl 1 mg daily Current orders for Inpatient glycemic control:  Lantus 10 units daily Novolog 0-15 units tid  Inpatient Diabetes Program Recommendations:     Noted cbg 60 mg/dL last evening.  Might consider,  Novolog 0-9 units tid with meals  Will continue to follow while inpatient.  Thank you, Dulce Sellar, RN, BSN Diabetes Coordinator Inpatient Diabetes Program [phone] (team pager from 8a-5p)

## 2020-09-24 NOTE — Progress Notes (Signed)
PROGRESS NOTE    Jeremiah [name]  ZDG:[id number] DOB: 1946/01/14 DOA: 09/14/2020 PCP: Clovia Cuff, MD   Brief Narrative:  74 yo male resident of [facility] with medical history significant of peripheral vascular disease, s/p right AKI, chronic diastolic heart failure, hypertension type 2 diabetes mellitus presented with dyspnea and hypoxia was in hospital from 08/09/20 to 08/21/20 with COVID 19 and aspiration pneumonia complicated by Rt pleural effusion treated with pig tail catheter drainage.  He was then in hospital from 08/26/20 to 08/30/20 with HCAP.  He presented to ER again on 09/14/20 with dyspnea from HCAP and Rt pleural effusion.  Developed respiratory distress leading to cardiac arrest on 10/26 and PCCM assumed care in ICU requiring intubation.  Subsequently he was extubated on 09/20/2020 and transferred back to [facility] on 09/22/2020.  ED Course: labs were significant for elevated creatinine of 1.73, elevated BNP of 1634, troponin of 29, hemoglobin of 7.4 and wbc count of 11.5. CXR shows Similar bilateral airspace opacities, compatible with multifocal pneumonia. Suspected layering left pleural effusion and similar chronic right pleural effusion with internal gas.  CT chest shows Progressive consolidation within the right upper lobe. Stable collapse and consolidation of the right middle lobe and lower lobes bilaterally.  Stable complex gas and fluid collection within the right pleural space with associated pleural thickening most in keeping with an empyema.  Significant Hospital Events   10/22 Admit 10/24 worsening hypoxia, needing Bipap 10/25 to IR for u/s assessment of pleural space >> not fluid pocket amenable to thoracentesis 10/26 Respiratory leading to cardiac arrest with ROSC in 10 minutes 10/27 Transfuse 2 units PRBC 10/28 Extubated  Micro Data:  MRSA PCR 10/22 >> negative COVID 10/22 >> negative Blood 10/22 >> negative Rt pleural fluid 10/26 >>  Sputum 10/26 >> rare  budding yeast    Assessment & Plan:   Active Problems:   Pressure injury of skin   Acute respiratory failure with hypoxia (HCC)   Protein-calorie malnutrition, severe   Palliative care by specialist   Goals of care, counseling/discussion   Acute hypoxic respiratory failure from HCAP and Rt empyema with trapped lung. - f/u CXR on 11/02 - bronchial hygiene - day 8/14 of ABx Currently only on 1 L of oxygen.  Wean down to room air as soon as we can.  Continue Zosyn for total of 14 days per PCCM recommendation.  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 - status post tube thoracostomy last month as well as pleural thrombolytics with fairly good results.  He now has a left-sided loculated effusion as well as changes concerning for an empyema on the right.  The right side was sampled and looks more consistent with just old blood with the thickened rind likely secondary to the thrombolytics administered.  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 - f/u pleural fluid cultures - chest tube removed 10/27  History of CAD and aortic stenosis with acute systolic CHF after cardiac arrest: Echo done in July this year showed 70% ejection fraction but repeat echo during this hospitalization done on 09/18/2020 shows 45% ejection fraction with left ventricular hypokinesis.  Appears slightly volume overloaded.  He received IV Lasix intermittently during this hospitalization.  I ordered another dose of IV Lasix 40 mg yesterday.  He has had good diuresis since then.  Now that cardiology is on board, I will defer further decision about diuresis to them.  It appeared that cardiology was  planning to do intervention but per today's note, they recommend medical management only.  Essential hypertension: Blood pressure now better.  We will continue Toprol-XL and amlodipine but hold hydralazine for now.  Pericardial effusion without tamponade:  Asymptomatic.  Monitor for now.  AKI: Resolved.  Anemia of chronic disease: Per chart review, it seems like his hemoglobin dropped to less than 7 on 09/20/2019 for which he received 2 unit of PRBC transfusion and since then his hemoglobin has remained stable.  Monitor daily.  DM type 2 poorly controlled with hyperglycemia/peripheral neuropathy: Recent hemoglobin A1c 6.0.  On oral hypoglycemics at home.  Hold them.  Blood sugar labile. Continue Lantus 10 units and continue SSI.  Continue gabapentin.  Pressure wounds: Wound care on board.  Severe protein calorie malnutrition: Nutrition consult  Deconditioning: PT OT recommends SNF.  Sent a message to social worker to find out about update.  Dysphagia: SLP on board.  Now on dysphagia diet 3.  DVT prophylaxis: enoxaparin (LOVENOX) injection 40 mg Start: 09/20/20 1415   Code Status: Full Code  Family Communication: None present at bedside.  Palliative care on board and having continued discussions with family.  Status is: Inpatient  Remains inpatient appropriate because:Inpatient level of care appropriate due to severity of illness   Dispo: The patient is from: SNF              Anticipated d/c is to: SNF              Anticipated d/c date is: 1 to 2 days.  When bed at SNF arranged by TOC.              Patient currently is medically stable to d/c.        Estimated body mass index is 18.39 kg/m as calculated from the following:   Height as of this encounter: _0  (1.753 m).   Weight as of this encounter: 56.5 kg.  Pressure Injury 08/27/20 Heel Left Deep Tissue Pressure Injury - Purple or maroon localized area of discolored intact skin or blood-filled blister due to damage of underlying soft tissue from pressure and/or shear. (Active)  08/27/20 1033  Location: Heel  Location Orientation: Left  Staging: Deep Tissue Pressure Injury - Purple or maroon localized area of discolored intact skin or blood-filled blister due to damage of  underlying soft tissue from pressure and/or shear.  Wound Description (Comments):   Present on Admission: Yes     Pressure Injury 09/18/20 Buttocks Left Stage 2 -  Partial thickness loss of dermis presenting as a shallow open injury with a red, pink wound bed without slough. (Active)  09/18/20 1000  Location: Buttocks  Location Orientation: Left  Staging: Stage 2 -  Partial thickness loss of dermis presenting as a shallow open injury with a red, pink wound bed without slough.  Wound Description (Comments):   Present on Admission:      Nutritional status:  Nutrition Problem: Severe Malnutrition Etiology: acute illness (COVID PNA, aspiration PNA)   Signs/Symptoms: moderate fat depletion, severe muscle depletion   Interventions: Ensure Enlive (each supplement provides 350kcal and 20 grams of protein)    Consultants:   Consulting cardiology today  PCCM on board  Procedures:  ETT 10/26 >> 10/28 Lt IJ CVL 10/26 >> 10/26 Rt [place] CVL 10/26 >>  Rt pig tail catheter 10/26 >> 10/27  Antimicrobials:   Antimicrobials:  Cefepime 10/22 >> 10/26  Vancomycin 10/26 Zosyn 10/26 >>  Anti-infectives (From admission, onward)   Start  Dose/Rate Route Frequency Ordered Stop   09/19/20 1600  vancomycin (VANCOCIN) IVPB 1000 mg/200 mL premix  Status:  Discontinued        1,000 mg 200 mL/hr over 60 Minutes Intravenous Every 24 hours 09/18/20 1423 09/20/20 0833   09/18/20 1600  vancomycin (VANCOREADY) IVPB 1250 mg/250 mL        1,250 mg 166.7 mL/hr over 90 Minutes Intravenous  Once 09/18/20 1423 09/18/20 1916   09/18/20 1515  piperacillin-tazobactam (ZOSYN) IVPB 3.375 g        3.375 g 12.5 mL/hr over 240 Minutes Intravenous Every 8 hours 09/18/20 1423 09/28/20 1359   09/16/20 1500  vancomycin (VANCOCIN) IVPB 1000 mg/200 mL premix  Status:  Discontinued        1,000 mg 200 mL/hr over 60 Minutes Intravenous Every 24 hours 09/15/20 1359 09/16/20 0802   09/15/20 1800  vancomycin  (VANCOCIN) IVPB 1000 mg/200 mL premix  Status:  Discontinued        1,000 mg 200 mL/hr over 60 Minutes Intravenous Every 24 hours 09/14/20 1726 09/15/20 1359   09/15/20 1430  vancomycin (VANCOREADY) IVPB 1500 mg/300 mL        1,500 mg 150 mL/hr over 120 Minutes Intravenous  Once 09/15/20 1359 09/15/20 1710   09/14/20 2200  ceFEPIme (MAXIPIME) 2 g in sodium chloride 0.9 % 100 mL IVPB  Status:  Discontinued        2 g 200 mL/hr over 30 Minutes Intravenous Every 12 hours 09/14/20 1726 09/18/20 1309   09/14/20 1730  vancomycin (VANCOREADY) IVPB 1500 mg/300 mL  Status:  Discontinued        1,500 mg 150 mL/hr over 120 Minutes Intravenous  Once 09/14/20 1726 09/15/20 1359         Subjective: Patient seen and examined.  He has no complaints.  Objective: Vitals:   09/24/20 0451 09/24/20 0500 09/24/20 0751 09/24/20 1145  BP: 131/67  119/87 (!) 139/58  Pulse:   67 62  Resp:   16 16  Temp:   98.5 F (36.9 C) 98 F (36.7 C)  TempSrc:   Oral Oral  SpO2:   98% 91%  Weight:  56.5 kg    Height:        Intake/Output Summary (Last 24 hours) at 09/24/2020 1344 Last data filed at 09/24/2020 0900 Gross per 24 hour  Intake 912.85 ml  Output 1250 ml  Net -337.15 ml   Filed Weights   09/22/20 0500 09/23/20 0500 09/24/20 0500  Weight: 56.6 kg 54.7 kg 56.5 kg    Examination:  General exam: Appears calm and comfortable  Respiratory system: Faint bibasilar rhonchi. Respiratory effort normal. Cardiovascular system: S1 & S2 heard, RRR. No JVD, murmurs, rubs, gallops or clicks. No pedal edema. Gastrointestinal system: Abdomen is nondistended, soft and nontender. No organomegaly or masses felt. Normal bowel sounds heard. Central nervous system: Alert and oriented. No focal neurological deficits. Extremities: Symmetric 5 x 5 power. Skin: No rashes, lesions or ulcers.  Psychiatry: Judgement and insight appear normal. Mood & affect appropriate.    Data Reviewed: I have personally reviewed  following labs and imaging studies  CBC: Recent Labs  Lab 09/18/20 0357 09/18/20 1224 09/19/20 0731 09/19/20 0731 09/19/20 1955 09/20/20 0500 09/22/20 0656 09/23/20 0325 09/24/20 0142  WBC 12.7*  --  13.8*  --   --  13.4* 11.8* 11.9* 11.6*  NEUTROABS 10.1*  --  9.8*  --   --  9.6*  --  8.2* 7.6  HGB 7.6*   < >  6.1*   < > 9.0* 8.6* 9.5* 9.2* 9.5*  HCT 24.8*   < > 19.6*   < > 27.4* 26.5* 30.2* 29.2* 29.9*  MCV 90.2  --  86.7  --   --  83.6 86.0 85.9 85.9  PLT 292  --  269  --   --  267 241 224 236   < > = values in this interval not displayed.   Basic Metabolic Panel: Recent Labs  Lab 09/19/20 0710 09/19/20 0710 09/19/20 1955 09/20/20 0500 09/20/20 1700 09/20/20 1955 09/21/20 0500 09/22/20 0656 09/23/20 0325 09/24/20 0142  NA 142   < >  --  143  --   --  145 143 141 140  K 4.3   < >  --  3.2*  --   --  3.7 3.5 3.4* 3.8  CL 107   < >  --  108  --   --  105 100 98 99  CO2 21*   < >  --  25  --   --  30 29 33* 32  GLUCOSE 121*   < >  --  227*  --   --  136* 129* 171* 55*  BUN 69*   < >  --  57*  --   --  34* 25* 21 20  CREATININE 1.88*   < >  --  1.58*  --   --  1.32* 1.19 1.18 1.15  CALCIUM 8.8*   < >  --  8.6*  --   --  8.5* 8.6* 8.5* 8.5*  MG 2.3  --  2.2 2.0 1.7  --   --   --  1.4*  --   PHOS 2.5  --  2.1* 1.5* 5.6* 4.5  --   --   --   --    < > = values in this interval not displayed.   GFR: Estimated Creatinine Clearance: 43.7 mL/min (by C-G formula based on SCr of 1.15 mg/dL). Liver Function Tests: Recent Labs  Lab 09/23/20 0325 09/24/20 0142  AST 14* 15  ALT 41 33  ALKPHOS 152* 121  BILITOT 0.5 0.7  PROT 6.6 6.6  ALBUMIN 1.8* 1.7*   No results for input(s): LIPASE, AMYLASE in the last 168 hours. No results for input(s): AMMONIA in the last 168 hours. Coagulation Profile: No results for input(s): INR, PROTIME in the last 168 hours. Cardiac Enzymes: No results for input(s): CKTOTAL, CKMB, CKMBINDEX, TROPONINI in the last 168 hours. BNP (last 3  results) No results for input(s): PROBNP in the last 8760 hours. HbA1C: No results for input(s): HGBA1C in the last 72 hours. CBG: Recent Labs  Lab 09/23/20 1714 09/23/20 2234 09/23/20 2346 09/24/20 0628 09/24/20 1147  GLUCAP 142* 60* 70 139* 189*   Lipid Profile: No results for input(s): CHOL, HDL, LDLCALC, TRIG, CHOLHDL, LDLDIRECT in the last 72 hours. Thyroid Function Tests: No results for input(s): TSH, T4TOTAL, FREET4, T3FREE, THYROIDAB in the last 72 hours. Anemia Panel: No results for input(s): VITAMINB12, FOLATE, FERRITIN, TIBC, IRON, RETICCTPCT in the last 72 hours. Sepsis Labs: Recent Labs  Lab 09/18/20 0357  PROCALCITON 1.36    Recent Results (from the past 240 hour(s))  Respiratory Panel by RT PCR (Flu A&B, Covid) - Nasopharyngeal Swab     Status: None   Collection Time: 09/14/20  5:39 PM   Specimen: Nasopharyngeal Swab  Result Value Ref Range Status   SARS Coronavirus 2 by RT PCR NEGATIVE NEGATIVE Final  Comment: (NOTE) SARS-CoV-2 target nucleic acids are NOT DETECTED.  The SARS-CoV-2 RNA is generally detectable in upper respiratoy specimens during the acute phase of infection. The lowest concentration of SARS-CoV-2 viral copies this assay can detect is 131 copies/mL. A negative result does not preclude SARS-Cov-2 infection and should not be used as the sole basis for treatment or other patient management decisions. A negative result may occur with  improper specimen collection/handling, submission of specimen other than nasopharyngeal swab, presence of viral mutation(s) within the areas targeted by this assay, and inadequate number of viral copies (<131 copies/mL). A negative result must be combined with clinical observations, patient history, and epidemiological information. The expected result is Negative.  Fact Sheet for Patients:  PinkCheek.be  Fact Sheet for Healthcare Providers:    GravelBags.it  This test is no t yet approved or cleared by the [place] FDA and  has been authorized for detection and/or diagnosis of SARS-CoV-2 by FDA under an Emergency Use Authorization (EUA). This EUA will remain  in effect (meaning this test can be used) for the duration of the COVID-19 declaration under Section 564(b)(1) of the Act, 21 U.S.C. section 360bbb-3(b)(1), unless the authorization is terminated or revoked sooner.     Influenza A by PCR NEGATIVE NEGATIVE Final   Influenza B by PCR NEGATIVE NEGATIVE Final    Comment: (NOTE) The Xpert Xpress SARS-CoV-2/FLU/RSV assay is intended as an aid in  the diagnosis of influenza from Nasopharyngeal swab specimens and  should not be used as a sole basis for treatment. Nasal washings and  aspirates are unacceptable for Xpert Xpress SARS-CoV-2/FLU/RSV  testing.  Fact Sheet for Patients: PinkCheek.be  Fact Sheet for Healthcare Providers: GravelBags.it  This test is not yet approved or cleared by the [place] FDA and  has been authorized for detection and/or diagnosis of SARS-CoV-2 by  FDA under an Emergency Use Authorization (EUA). This EUA will remain  in effect (meaning this test can be used) for the duration of the  Covid-19 declaration under Section 564(b)(1) of the Act, 21  U.S.C. section 360bbb-3(b)(1), unless the authorization is  terminated or revoked. Performed at [facility], [place] [address] [place], [place] [address]   Culture, blood (Routine X 2) w Reflex to ID Panel     Status: None   Collection Time: 09/14/20  8:13 PM   Specimen: BLOOD RIGHT HAND  Result Value Ref Range Status   Specimen Description BLOOD RIGHT HAND  Final   Special Requests   Final    BOTTLES DRAWN AEROBIC ONLY Blood Culture adequate volume   Culture   Final    NO GROWTH 5 DAYS Performed at [facility], [place] [address]  [place], [place] [address]    Report Status 09/19/2020 FINAL  Final  Culture, blood (Routine X 2) w Reflex to ID Panel     Status: None   Collection Time: 09/14/20  8:24 PM   Specimen: BLOOD LEFT HAND  Result Value Ref Range Status   Specimen Description BLOOD LEFT HAND  Final   Special Requests   Final    BOTTLES DRAWN AEROBIC ONLY Blood Culture results may not be optimal due to an inadequate volume of blood received in culture bottles   Culture   Final    NO GROWTH 5 DAYS Performed at [facility], [place] [address], [place], [place] [address]    Report Status 09/19/2020 FINAL  Final  Culture, respiratory (non-expectorated)  Status: None   Collection Time: 09/18/20 12:23 PM   Specimen: Bronchoalveolar Lavage; Respiratory  Result Value Ref Range Status   Specimen Description BRONCHIAL ALVEOLAR LAVAGE  Final   Special Requests NONE  Final   Gram Stain   Final    RARE WBC PRESENT, PREDOMINANTLY PMN RARE BUDDING YEAST SEEN    Culture   Final    RARE Normal respiratory flora-no Staph aureus or Pseudomonas seen Performed at [facility], [address] [place], [place] [address]    Report Status 09/20/2020 FINAL  Final  Fungus Culture With Stain     Status: None (Preliminary result)   Collection Time: 09/18/20 12:23 PM   Specimen: Bronchial Alveolar Lavage  Result Value Ref Range Status   Fungus Stain Final report  Final    Comment: (NOTE) Performed At: [facility] [place], [place] [id number] Rush Farmer MD ID:[id number]    Fungus (Mycology) Culture PENDING  Incomplete   Fungal Source BRONCHIAL ALVEOLAR LAVAGE  Final    Comment: Performed at [facility], [place] [address], [place], [place] [address]  Fungus Culture Result     Status: None   Collection Time: 09/18/20 12:23 PM  Result Value Ref Range Status   Result 1 Comment  Final    Comment: (NOTE) KOH/Calcofluor preparation:  no fungus observed. Performed At: [facility] [place], [place] [id number] Rush Farmer MD JK:[id number]   Fungus Culture With Stain     Status: None (Preliminary result)   Collection Time: 09/18/20  4:01 PM   Specimen: Pleural, Right  Result Value Ref Range Status   Fungus Stain Final report  Final    Comment: (NOTE) Performed At: [facility] [place], [place] [id number] Rush Farmer MD [id number]    Fungus (Mycology) Culture PENDING  Incomplete   Fungal Source FLUID  Final    Comment: RIGHT PLEURAL Performed at [facility], [place] [address], [place], [place] [address]   Gram stain     Status: None   Collection Time: 09/18/20  4:01 PM   Specimen: Fluid  Result Value Ref Range Status   Specimen Description FLUID PLEURAL RIGHT  Final   Special Requests BOTTLES DRAWN AEROBIC AND ANAEROBIC  Final   Gram Stain   Final    FEW WBC PRESENT,BOTH PMN AND MONONUCLEAR NO ORGANISMS SEEN Performed at [facility], [place] [address] [place], [place] [address]    Report Status 09/18/2020 FINAL  Final  Culture, body fluid-bottle     Status: None   Collection Time: 09/18/20  4:01 PM   Specimen: Fluid  Result Value Ref Range Status   Specimen Description FLUID PLEURAL RIGHT  Final   Special Requests NONE  Final   Culture   Final    NO GROWTH 5 DAYS Performed at [place] [address] [place], [place] [address]    Report Status 09/23/2020 FINAL  Final  Fungus Culture Result     Status: None   Collection Time: 09/18/20  4:01 PM  Result Value Ref Range Status   Result 1 Comment  Final    Comment: (NOTE) KOH/Calcofluor preparation:  no fungus observed. Performed At: [facility] [place], [place] [id number] Rush Farmer MD QI:[id number]   MRSA PCR Screening     Status: None   Collection Time: 09/19/20 10:25 AM   Specimen: Nasal Mucosa; Nasopharyngeal  Result Value Ref Range Status  MRSA by PCR NEGATIVE NEGATIVE Final     Comment:        The GeneXpert MRSA Assay (FDA approved for NASAL specimens only), is one component of a comprehensive MRSA colonization surveillance program. It is not intended to diagnose MRSA infection nor to guide or monitor treatment for MRSA infections. Performed at [facility], [place] [address] [place], [place] [address]       Radiology Studies: DG Chest 2 View  Result Date: 09/24/2020 CLINICAL DATA:  Shortness of breath. EXAM: CHEST - 2 VIEW COMPARISON:  September 21, 2020. FINDINGS: Stable cardiomegaly. Probable central pulmonary vascular congestion is noted. No pneumothorax is noted. Bilateral perihilar and basilar opacities are noted concerning for edema or atelectasis with associated pleural effusions. Bony thorax is unremarkable. IMPRESSION: Stable cardiomegaly with central pulmonary vascular congestion. Bilateral perihilar and basilar opacities are noted concerning for edema or atelectasis with associated pleural effusions. Electronically Signed   By: Marijo Conception M.D.   On: 09/24/2020 08:05   DG Swallowing Func-Speech Pathology  Result Date: 09/23/2020 Objective Swallowing Evaluation: Type of Study: MBS-Modified Barium Swallow Study  Patient Details Name: Jeremiah [name] MRN: [id number] Date of Birth: 1946/10/26 Today's Date: 09/23/2020 Time: SLP Start Time (ACUTE ONLY): 1059 -SLP Stop Time (ACUTE ONLY): 1120 SLP Time Calculation (min) (ACUTE ONLY): 21 min Past Medical History: Past Medical History: Diagnosis Date . Amputee  . Aortic atherosclerosis ([place])  . Arthritis   "joints; shoulders, knees, hands, back" (05/21/2018) . Atherosclerosis of coronary artery  . C. difficile diarrhea 04/2018 . Diastolic CHF ([place])  . Diverticulosis  . High cholesterol  . History of gout  . Hypertension  . IDA (iron deficiency anemia)   from referral Dr Daphene Jaeger . Internal hemorrhoids  . Peripheral vascular disease ([place])  . Pneumonia   "couple times" (05/21/2018) . Sleep apnea   "has mask; won't  use" (05/21/2018) . Type II diabetes mellitus ([place])  Past Surgical History: Past Surgical History: Procedure Laterality Date . ABDOMINAL AORTOGRAM W/LOWER EXTREMITY N/A 11/01/2018  Procedure: ABDOMINAL AORTOGRAM W/LOWER EXTREMITY;  Surgeon: Lorretta Harp, MD;  Location: [facility];  Service: Cardiovascular;  Laterality: N/A; . ABDOMINAL AORTOGRAM W/LOWER EXTREMITY Left 06/20/2020  Procedure: ABDOMINAL AORTOGRAM W/LOWER EXTREMITY;  Surgeon: Marty Heck, MD;  Location: [facility];  Service: Cardiovascular;  Laterality: Left; . AMPUTATION Right 11/09/2018  Procedure: RIGHT - AMPUTATION ABOVE KNEE;  Surgeon: Waynetta Sandy, MD;  Location: [facility];  Service: Vascular;  Laterality: Right; . CATARACT EXTRACTION W/ INTRAOCULAR LENS  IMPLANT, BILATERAL Bilateral  . CHOLECYSTECTOMY  05/21/2018  ATTEMPTED LAPAROSCOPIC CHOLECYSTECTOMY, OPEN DRAINAGE OF GALLBLADDER WITH BIOPSY . CHOLECYSTECTOMY N/A 05/21/2018  Procedure: ATTEMPTED LAPAROSCOPIC CHOLECYSTECTOMY, OPEN DRAINAGE OF GALLBLADDER WITH BIOPSY;  Surgeon: Jovita Kussmaul, MD;  Location: [facility];  Service: General;  Laterality: N/A; . COLONOSCOPY W/ POLYPECTOMY   . COLONOSCOPY WITH PROPOFOL N/A 09/16/2018  Procedure: COLONOSCOPY WITH PROPOFOL;  Surgeon: Wilford Corner, MD;  Location: [facility];  Service: Endoscopy;  Laterality: N/A; . 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 FLEXIBLE SIGMOIDOSCOPY N/A 04/20/2020  Procedure: FLEXIBLE SIGMOIDOSCOPY;  Surgeon: Rush Landmark Telford Nab., MD;  Location: [facility];  Service: Gastroenterology;  Laterality: N/A;  Fecal Disimpaction . IR CATHETER TUBE CHANGE  04/14/2018 . IR CHOLANGIOGRAM EXISTING TUBE  03/17/2018 . IR PERC CHOLECYSTOSTOMY  01/31/2018 . IR RADIOLOGIST EVAL & MGMT  03/02/2018 .  PERIPHERAL VASCULAR INTERVENTION Left 06/20/2020  Procedure: PERIPHERAL VASCULAR  INTERVENTION;  Surgeon: Marty Heck, MD;  Location: [facility];  Service: Cardiovascular;  Laterality: Left;  4 SFA STENTS HPI: 74 yo male resident of [place] with medical history significant of peripheral vascular disease, s/p right AKI, chronic diastolic heart failure, hypertension type 2 diabetes mellitus presented with dyspnea and hypoxia was in hospital from 08/09/20 to 08/21/20 with COVID 19 and aspiration pneumonia complicated by Rt pleural effusion treated with pig tail catheter drainage.  He was then in hospital from 08/26/20 to 08/30/20 with HCAP.  He presented to ER again on 09/14/20 with dyspnea from HCAP and Rt pleural effusion. Pt evaluated by SLP on 10/23 and found to have no signs of dysphagia. On 10/26 pt developed respiratory distress and cardiac arrest and was intubated until 10/28. Again on 10/29 pt had some respiratory distress. There was concern for aspiration.  Subjective: pleasant, son in room with him, upset he is not able to go to wife's funeral today Assessment / Plan / Recommendation CHL IP CLINICAL IMPRESSIONS 09/23/2020 Clinical Impression Pt presented with pharyngeal dysphagia characterized by mildly reduced anterior laryngeal movement and reduced duration of cricopharyngeal relaxation which resulted in pyriform sinus residue across consistencies.No functional benefit was noted with postural modifications. A liquid wash reduced, but did not eliminate, residue and pt was unable to demonstrate a mendelsohn maneuver. Penetration (PAS 2) was noted with consecutive swallows of thin liquids, but this is considered to be WNL and no instances of aspiration were demonstrated. Pt denied sensation of pharyngeal residue throughout the study. Due to the pt's recent episode of "choking", the potential impact of pyriform sinus residue on safety is considered. A dysphagia 3 diet with thin liquids is recommended at this time with observance of swallowing precautions. SLP will follow  for diet tolerance.  SLP Visit Diagnosis Dysphagia, unspecified (R13.10) Attention and concentration deficit following -- Frontal lobe and executive function deficit following -- Impact on safety and function Moderate aspiration risk   [facility] IP TREATMENT RECOMMENDATION 09/23/2020 Treatment Recommendations Therapy as outlined in treatment plan below   Prognosis 09/23/2020 Prognosis for Safe Diet Advancement Good Barriers to Reach Goals -- Barriers/Prognosis Comment -- [facility] IP DIET RECOMMENDATION 09/23/2020 SLP Diet Recommendations Dysphagia 3 (Mech soft) solids;Thin liquid Liquid Administration via Cup;Straw Medication Administration Whole meds with liquid Compensations Slow rate;Small sips/bites;Follow solids with liquid Postural Changes Seated upright at 90 degrees;Remain semi-upright after after feeds/meals (Comment)   [facility] IP OTHER RECOMMENDATIONS 09/23/2020 Recommended Consults -- Oral Care Recommendations Oral care BID Other Recommendations --   [facility] IP FOLLOW UP RECOMMENDATIONS 09/23/2020 Follow up Recommendations Skilled Nursing facility   [facility] IP FREQUENCY AND DURATION 09/23/2020 Speech Therapy Frequency (ACUTE ONLY) min 2x/week Treatment Duration 2 weeks      [facility] IP ORAL PHASE 09/23/2020 Oral Phase WFL Oral - Pudding Teaspoon -- Oral - Pudding Cup -- Oral - Honey Teaspoon -- Oral - Honey Cup -- Oral - Nectar Teaspoon -- Oral - Nectar Cup -- Oral - Nectar Straw -- Oral - Thin Teaspoon -- Oral - Thin Cup -- Oral - Thin Straw -- Oral - Puree -- Oral - Mech Soft -- Oral - Regular -- Oral - Multi-Consistency -- Oral - Pill -- Oral Phase - Comment --  [facility] IP PHARYNGEAL PHASE 09/23/2020 Pharyngeal Phase Impaired Pharyngeal- Pudding Teaspoon -- Pharyngeal -- Pharyngeal- Pudding Cup -- Pharyngeal -- Pharyngeal- Honey Teaspoon -- Pharyngeal -- Pharyngeal- Honey Cup -- Pharyngeal -- Pharyngeal- Nectar  Teaspoon -- Pharyngeal -- Pharyngeal- Nectar Cup -- Pharyngeal -- Pharyngeal- Nectar Straw -- Pharyngeal -- Pharyngeal- Thin  Teaspoon -- Pharyngeal -- Pharyngeal- Thin Cup -- Pharyngeal -- Pharyngeal- Thin Straw -- Pharyngeal -- Pharyngeal- Puree -- Pharyngeal -- Pharyngeal- Mechanical Soft -- Pharyngeal -- Pharyngeal- Regular -- Pharyngeal -- Pharyngeal- Multi-consistency -- Pharyngeal -- Pharyngeal- Pill -- Pharyngeal -- Pharyngeal Comment --  [facility] IP CERVICAL ESOPHAGEAL PHASE 09/23/2020 Cervical Esophageal Phase Reduced duration of cricopharyngeal relaxation but otherwise WNL Pudding Teaspoon -- Pudding Cup -- Honey Teaspoon -- Honey Cup -- Nectar Teaspoon -- Nectar Cup -- Nectar Straw -- Thin Teaspoon -- Thin Cup -- Thin Straw -- Puree -- Mechanical Soft -- Regular -- Multi-consistency -- Pill -- Cervical Esophageal Comment -- Shanika I. Hardin Negus, [place], [place] Office number [phone] Pager [phone] Horton Marshall 09/23/2020, 12:52 PM               Scheduled Meds: . sodium chloride   Intravenous Once  . amLODipine  10 mg Oral Daily  . aspirin  81 mg Oral Daily  . atorvastatin  20 mg Oral q1800  . brimonidine  1 drop Both Eyes BID  . Chlorhexidine Gluconate Cloth  6 each Topical Daily  . enoxaparin (LOVENOX) injection  40 mg Subcutaneous Q24H  . feeding supplement  237 mL Oral TID BM  . ferrous sulfate  325 mg Oral BID WC  . gabapentin  300 mg Oral BID  . insulin aspart  0-15 Units Subcutaneous TID WC  . insulin aspart  0-5 Units Subcutaneous QHS  . insulin glargine  10 Units Subcutaneous Daily  . liver oil-zinc oxide   Topical BID  . mouth rinse  15 mL Mouth Rinse BID  . metoprolol succinate  25 mg Oral Daily  . nystatin  5 mL Oral QID  . sodium chloride flush  10-40 mL Intracatheter Q12H  . sodium chloride flush  3 mL Intravenous Q12H  . timolol  1 drop Both Eyes BID  . cyanocobalamin  1,000 mcg Oral Daily   Continuous Infusions: . sodium chloride Stopped (09/18/20 2157)  . piperacillin-tazobactam (ZOSYN)  IV 3.375 g (09/24/20 1342)     LOS: 10 days   Time spent:  28 minutes   Darliss Cheney, MD [facility]  09/24/2020, 1:44 PM   To contact the attending provider between 7A-7P or the covering provider during after hours 7P-7A, please log into the web site [email or web address].

## 2020-09-24 NOTE — Care Management Important Message (Signed)
Important Message  Patient Details  Name: ORMAND SENN MRN: [id number] Date of Birth: 1946-07-02   Medicare Important Message Given:  Yes     Renie Ora 09/24/2020, 1:02 PM

## 2020-09-25 DIAGNOSIS — J9601 Acute respiratory failure with hypoxia: Secondary | ICD-10-CM | POA: Diagnosis not present

## 2020-09-25 LAB — CBC WITH DIFFERENTIAL/PLATELET
Abs Immature Granulocytes: 0.04 10*3/uL (ref 0.00–0.07)
Basophils Absolute: 0.1 10*3/uL (ref 0.0–0.1)
Basophils Relative: 1 %
Eosinophils Absolute: 0.5 10*3/uL (ref 0.0–0.5)
Eosinophils Relative: 4 %
HCT: 29.7 % — ABNORMAL LOW (ref 39.0–52.0)
Hemoglobin: 9.4 g/dL — ABNORMAL LOW (ref 13.0–17.0)
Immature Granulocytes: 0 %
Lymphocytes Relative: 13 %
Lymphs Abs: 1.6 10*3/uL (ref 0.7–4.0)
MCH: 27.5 pg (ref 26.0–34.0)
MCHC: 31.6 g/dL (ref 30.0–36.0)
MCV: 86.8 fL (ref 80.0–100.0)
Monocytes Absolute: 1.5 10*3/uL — ABNORMAL HIGH (ref 0.1–1.0)
Monocytes Relative: 13 %
Neutro Abs: 8.6 10*3/uL — ABNORMAL HIGH (ref 1.7–7.7)
Neutrophils Relative %: 69 %
Platelets: 226 10*3/uL (ref 150–400)
RBC: 3.42 MIL/uL — ABNORMAL LOW (ref 4.22–5.81)
RDW: 17.8 % — ABNORMAL HIGH (ref 11.5–15.5)
WBC: 12.3 10*3/uL — ABNORMAL HIGH (ref 4.0–10.5)
nRBC: 0 % (ref 0.0–0.2)

## 2020-09-25 LAB — GLUCOSE, CAPILLARY: Glucose-Capillary: 91 mg/dL (ref 70–99)

## 2020-09-25 LAB — RESPIRATORY PANEL BY RT PCR (FLU A&B, COVID)
Influenza A by PCR: NEGATIVE
Influenza B by PCR: NEGATIVE
SARS Coronavirus 2 by RT PCR: NEGATIVE

## 2020-09-25 MED ORDER — FUROSEMIDE 10 MG/ML IJ SOLN
40.0000 mg | Freq: Once | INTRAMUSCULAR | Status: AC
  Administered 2020-09-25: 40 mg via INTRAVENOUS
  Filled 2020-09-25: qty 4

## 2020-09-25 MED ORDER — MENTHOL 3 MG MT LOZG
1.0000 | LOZENGE | OROMUCOSAL | Status: DC | PRN
  Filled 2020-09-25: qty 9

## 2020-09-25 MED ORDER — TRAMADOL HCL 50 MG PO TABS: 50.0000 mg | ORAL_TABLET | Freq: Two times a day (BID) | ORAL | 0 refills | Status: DC | PRN

## 2020-09-25 NOTE — Discharge Summary (Addendum)
Physician Discharge Summary  Jeremiah [name] OHY:[id number] DOB: 08/02/1946 DOA: 09/14/2020  PCP: Clovia Cuff, MD  Admit date: 09/14/2020 Discharge date: 09/25/2020  Admitted From: SNF Disposition: SNF  Recommendations for Outpatient Follow-up:  1. Follow up with PCP in 1-2 weeks 2. Please obtain BMP/CBC in one week 3. Please follow up with your PCP on the following pending results: Unresulted Labs (From admission, onward)          Start     Ordered   09/23/20 0500  CBC with Differential/Platelet  Daily,   R     Question:  Specimen collection method  Answer:  Lab=Lab collect   09/22/20 1119   09/17/20 0500  CBC with Differential/Platelet  Daily,   R      09/16/20 0802           Home Health: None Equipment/Devices: None  Discharge Condition: Stable CODE STATUS: Full code Diet recommendation: Cardiac  Subjective: Seen and examined this morning.  Feels well without having any complaints.  Brief/Interim Summary: 74 yo male resident of [place] with medical history significant ofperipheral vascular disease, s/p right AKI, chronic diastolic heart failure, hypertension type 2 diabetes mellitus presented with dyspnea and hypoxia was in hospital from 08/09/20 to 08/21/20 with COVID 19 and aspiration pneumonia complicated by Rt pleural effusion treated with pig tail catheter drainage. He was then in hospital from 08/26/20 to 08/30/20 with HCAP. He presented to ER again on 09/14/20 with dyspnea from HCAP and Rt pleural effusion. Developed respiratory distress leading to cardiac arrest on 10/26 and PCCM assumed care in ICU requiring intubation.  He was being treated with IV Zosyn. Subsequently he was extubated on 09/20/2020 and transferred back to [facility] on 09/22/2020.  ED Course:labs were significant for elevated creatinine of 1.73, elevated BNP of 1634, troponin of 29, hemoglobin of 7.4 and wbc count of 11.5. CXR showsSimilar bilateral airspace opacities, compatible with  multifocal pneumonia. Suspected layering left pleural effusion and similar chronic right pleural effusion with internal gas.  CT chest showsProgressive consolidation within the right upper lobe. Stable collapse and consolidation of the right middle lobe and lower lobes bilaterally. Stable complex gas and fluid collection within the right pleural space with associated pleural thickening most in keeping with anempyema.  Acute hypoxic respiratory failure from HCAP and Rt empyema with trapped lung.  He was on IV Zosyn which was discontinued yesterday after PCCM recommended to stop it.  He received almost 10 days of it.  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 - status post tube thoracostomy last month as well as pleural thrombolytics with fairly good results.   The right side was sampled and looks more consistent with just old blood with the thickened rind likely secondary to the thrombolytics administered.  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  Pleural fluid culture remain negative.  Chest tube was removed on 09/19/2020.  He was followed by PCCM.   History of CAD and aortic stenosis with acute systolic CHF after cardiac arrest: Echo done in July this year showed 70% ejection fraction but repeat echo during this hospitalization done on 09/18/2020 shows 45% ejection fraction with left ventricular hypokinesis.  Appears slightly volume overloaded.  He received IV Lasix intermittently during this hospitalization.  I ordered another dose of IV Lasix 40 mg daily for yesterday and today.  He has had good diuresis since then.    He was seen by cardiology.  He was deemed to  be very high risk candidate for any intervention and thus they recommended continuing medical management.  They did not recommend any changes to his medication so all his home medications are being resumed.  Essential hypertension: Blood pressure controlled.  Resume all home  medications.  Pericardial effusion without tamponade: Asymptomatic.  Monitor for now.  AKI: Resolved.  Anemia of chronic disease: Per chart review, it seems like his hemoglobin dropped to less than 7 on 09/20/2019 for which he received 2 unit of PRBC transfusion and since then his hemoglobin has remained stable.  Monitor daily.  Deconditioning: PT OT recommends SNF.    Dysphagia: Evaluated by SLP. Now on dysphagia diet 3  This complete plan of discharge was discussed with patient's son [name].  He was in agreement.  Discharge Diagnoses:  Active Problems:   Pressure injury of skin   Acute respiratory failure with hypoxia (HCC)   Protein-calorie malnutrition, severe   Palliative care by specialist   Goals of care, counseling/discussion    Discharge Instructions   Allergies as of 09/25/2020   No Known Allergies     Medication List    STOP taking these medications   cyanocobalamin 1000 MCG tablet     TAKE these medications   acetaminophen 500 MG tablet Commonly known as: TYLENOL Take 1,000 mg by mouth in the morning, at noon, and at bedtime.   amLODipine 10 MG tablet Commonly known as: NORVASC Take 10 mg by mouth daily.   aspirin 81 MG tablet Take 81 mg by mouth daily.   atorvastatin 20 MG tablet Commonly known as: LIPITOR Take 1 tablet (20 mg total) by mouth daily. What changed: when to take this   brimonidine 0.2 % ophthalmic solution Commonly known as: ALPHAGAN Place 1 drop into both eyes 2 (two) times daily.   clopidogrel 75 MG tablet Commonly known as: PLAVIX Take 1 tablet (75 mg total) by mouth daily with breakfast.   ferrous sulfate 325 (65 FE) MG tablet Take 1 tablet (325 mg total) by mouth 2 (two) times daily with a meal.   gabapentin 300 MG capsule Commonly known as: NEURONTIN Take 1 capsule (300 mg total) by mouth 2 (two) times daily.   glimepiride 1 MG tablet Commonly known as: AMARYL Take 1 tablet (1 mg total) by mouth daily with  breakfast.   glucose blood test strip Accu-Chek Aviva Plus test strips  Take 1 strip 3 times a day by miscell. route for 90 days.   glucose blood test strip Accu-Chek Aviva Plus test strips  USE AS DIRECTED THREE TIMES DAILY.   hydrALAZINE 100 MG tablet Commonly known as: APRESOLINE Take 0.5 tablets (50 mg total) by mouth 2 (two) times daily.   metoprolol succinate 25 MG 24 hr tablet Commonly known as: TOPROL-XL Take 1 tablet (25 mg total) by mouth daily.   pantoprazole 40 MG tablet Commonly known as: PROTONIX Take 1 tablet (40 mg total) by mouth daily.   psyllium 58.6 % powder Commonly known as: METAMUCIL Take 1 packet by mouth daily.   timolol 0.5 % ophthalmic solution Commonly known as: TIMOPTIC Place 1 drop into both eyes 2 (two) times daily.   traMADol 50 MG tablet Commonly known as: ULTRAM Take 50 mg by mouth 2 (two) times daily as needed for moderate pain.   VITAMIN C PO Take 2 tablets by mouth daily.       Follow-up Information    Clovia Cuff, MD Follow up in 1 week(s).   Specialty: Internal Medicine  Contact information: [place] [id number] [phone]        Charolette Forward, MD .   Specialty: Cardiology Contact information: [address] [place] [address] [place] [address] [phone]              No Known Allergies  Consultations: CCM and cardiology   Procedures/Studies: DG Chest 1 View  Result Date: 09/18/2020 CLINICAL DATA:  Central catheter placement.  Hypoxia. EXAM: CHEST  1 VIEW COMPARISON:  September 18, 2020 chest radiograph; chest CT September 14, 2020 FINDINGS: There is a left jugular catheter with the tip either at or overlying the lateral aspect of the aortic arch. This catheter tip may reside within a left superior intercostal vein. Arterial placement cannot be excluded, however. Endotracheal tube tip is 4.6 cm above the carina. Nasogastric tube tip and side port are below the diaphragm. No evident  pneumothorax. There are pleural effusions bilaterally with airspace opacity throughout much of the right lung. A lucent area along the inferolateral right base is stable compared to earlier in the day there is subtle ill-defined opacity in portions of the left lower lung region. Heart is upper normal in size with pulmonary vascularity normal. No adenopathy. Probable small infarct in the right proximal humerus. IMPRESSION: 1. New left subclavian catheter present with tip either in a left superior intercostal vein or possibly within the lateral aspect of the aortic arch. Advise sampling of blood from the catheter to ascertain venous versus arterial placement. 2. Endotracheal and nasogastric tube positions as described. No pneumothorax. 3. Persistent small pleural effusions bilaterally. Multifocal airspace opacity, considerably more on the right than on the left, stable compared to earlier in the day. Note the lucency in the lateral right base persists which potentially may represent focal developing abscess/focus of empyema. This finding was also present on prior CT examination September 14, 2020, supporting suspicion for infectious etiology for this lucency. These results will be called to the ordering clinician or representative by the Radiologist Assistant, and communication documented in the PACS or [employer]. 4. Stable cardiac silhouette. Aortic Atherosclerosis (ICD10-I70.0). Electronically Signed   By: Lowella Grip III M.D.   On: 09/18/2020 14:27   DG Chest 2 View  Result Date: 09/24/2020 CLINICAL DATA:  Shortness of breath. EXAM: CHEST - 2 VIEW COMPARISON:  September 21, 2020. FINDINGS: Stable cardiomegaly. Probable central pulmonary vascular congestion is noted. No pneumothorax is noted. Bilateral perihilar and basilar opacities are noted concerning for edema or atelectasis with associated pleural effusions. Bony thorax is unremarkable. IMPRESSION: Stable cardiomegaly with central pulmonary  vascular congestion. Bilateral perihilar and basilar opacities are noted concerning for edema or atelectasis with associated pleural effusions. Electronically Signed   By: Marijo Conception M.D.   On: 09/24/2020 08:05   CT Chest Wo Contrast  Result Date: 09/14/2020 CLINICAL DATA:  Pleural effusion, worsening hypoxia, multifocal pneumonia EXAM: CT CHEST WITHOUT CONTRAST TECHNIQUE: Multidetector CT imaging of the chest was performed following the standard protocol without IV contrast. COMPARISON:  08/27/2020 FINDINGS: Cardiovascular: Extensive multi-vessel coronary artery calcification. Mild global cardiomegaly. Mild left ventricular dilation. Relative hypoattenuation of the cardiac blood pool is in keeping with at least moderate anemia. No pericardial effusion. Central pulmonary arteries are enlarged in keeping with pulmonary arterial hypertension. Moderate atherosclerotic calcification is seen within the thoracic aorta. No aortic aneurysm. Mediastinum/Nodes: Visualized thyroid unremarkable. Shotty mediastinal adenopathy is likely reactive in nature. Right hilar adenopathy is not well assessed due to confluent pulmonary parenchymal disease on  this noncontrast examination. The esophagus is unremarkable. Lungs/Pleura: There is persistent subtotal collapse of the left lower lobe and total collapse of the lateral segment of the right middle lobe and the entire right lower lobe. Stable consolidation within the superior segment of the a right lower lobe and progressive consolidation involving the dependent right upper lobe. Increasing bronchial wall thickening within the aerated right upper lobe in keeping with airway inflammation. Stable trace interstitial pulmonary infiltrate most in keeping with superimposed mild interstitial pulmonary edema. Unchanged complex gas and fluid containing collection within the right pleural space most in keeping with a empyema measuring at least 5.2 x 13.0 x 10.7 cm in greatest  dimension. There is associated pleural thickening, best noted at the lung base, in keeping with pleural inflammation. On the left, partially loculated small left pleural effusion is again seen with loculated fluid within the major fissure, unchanged. Mild pleural thickening is again noted, suggesting a complex effusion such as a parapneumonic effusion. Superinfection is difficult to exclude on this examination. Upper Abdomen: No acute abnormality. Musculoskeletal: No acute bone abnormality. IMPRESSION: Progressive consolidation within the right upper lobe. Stable collapse and consolidation of the right middle lobe and lower lobes bilaterally. Stable complex gas and fluid collection within the right pleural space with associated pleural thickening most in keeping with a a empyema. Stable complex partially loculated small left pleural effusion largely within the interlobular fissure. Stable superimposed mild interstitial pulmonary edema. Extensive coronary artery calcification. Morphologic changes of pulmonary arterial hypertension. Aortic Atherosclerosis (ICD10-I70.0). Electronically Signed   By: Fidela Salisbury MD   On: 09/14/2020 17:05   CT CHEST WO CONTRAST  Result Date: 08/27/2020 CLINICAL DATA:  Persistent cough EXAM: CT CHEST WITHOUT CONTRAST TECHNIQUE: Multidetector CT imaging of the chest was performed following the standard protocol without IV contrast. COMPARISON:  08/19/2020 FINDINGS: Cardiovascular: Mildly enlarged heart. Aortic and coronary atherosclerosis. Mediastinum/Nodes: Large mediastinal lymph nodes, especially subcarinal, considered reactive given the extent of pulmonary findings. Prominent thickness of the mid to lower esophagus, not well assessed on this noncontrast chest CT Lungs/Pleura: Chronic right pleural collection with recent drainage. The cavity is of similar size with internal gas and fluid/debris. There is a new left pleural effusion which shows signs of loculation posteriorly at  the level of the fissure. Scattered irregular nodules in the right upper lobe, acute and inflammatory. Airspace disease and atelectasis now seen in the left lung. Upper Abdomen: No acute finding Musculoskeletal: Spondylosis. No acute or aggressive finding. IMPRESSION: 1. Acute airspace disease with new small to moderate left pleural effusion that is loculated appearing. 2. Stable chronic right pleural collection with gas and debris secondary to recent drainage. 3. Mild interstitial edema. Electronically Signed   By: Monte Fantasia M.D.   On: 08/27/2020 04:16   US RENAL  Result Date: 09/14/2020 CLINICAL DATA:  Acute renal injury EXAM: RENAL / URINARY TRACT ULTRASOUND COMPLETE COMPARISON:  None. FINDINGS: Right Kidney: Renal measurements: 10.5 x 5.1 x 5.6 cm. = volume: 158 mL. 1.5 cm cyst is noted in the upper pole. No mass lesion or hydronephrosis is noted. Small extrarenal pelvis is seen. Left Kidney: Renal measurements: 9.4 x 4.5 x 4.6 cm. = volume: 103 mL. Echogenicity within normal limits. No mass or hydronephrosis visualized. Bladder: Appears normal for degree of bladder distention. Other: Note is made of a small left-sided pleural effusion. IMPRESSION: Small left pleural effusion. Right renal cyst. No other focal abnormality is noted. Electronically Signed   By: Linus Mako.D.  On: 09/14/2020 18:46   DG Chest 1V REPEAT Same Day  Result Date: 09/18/2020 CLINICAL DATA:  74 year old male status post chest tube placement. EXAM: CHEST - 1 VIEW SAME DAY COMPARISON:  Earlier radiograph dated 09/18/2020. FINDINGS: Interval placement of a pigtail right chest tube with tip over the inferior aspect of the right lung. No significant interval change in the size of right pleural collection or air compared to the earlier radiograph. Right subclavian central venous line with tip over central SVC close to the cavoatrial junction. Endotracheal tube remains above the carina and enteric tube extends below the  diaphragm with tip beyond the inferior margin of the image. Left IJ central venous line in similar position. Diffuse parenchymal densities and bilateral pleural effusions, similar to prior radiograph. Stable cardiac silhouette. No acute osseous pathology. IMPRESSION: 1. Interval placement of a right chest tube. No significant interval change in the size of the right pleural collection or air compared to the earlier radiograph. 2. Left IJ central venous line in similar position. Clinical correlation is recommended. Electronically Signed   By: Anner Crete M.D.   On: 09/18/2020 16:37   DG Chest Port 1 View  Result Date: 09/21/2020 CLINICAL DATA:  Hypoxia. EXAM: PORTABLE CHEST 1 VIEW COMPARISON:  09/20/2020 FINDINGS: Endotracheal tube and nasogastric tube have been removed. RIGHT subclavian line has been removed. Heart is enlarged in also accentuated by technique, stable in appearance. There are patchy opacities throughout the lungs bilaterally. There are bilateral pleural effusions. Lucency at the LATERAL RIGHT lung base appears stable and likely represents known empyema. There is increased atelectasis or early infiltrate in the LEFT LATERAL lung base. IMPRESSION: 1. Persistent bilateral infiltrates and pleural effusions. 2. Stable appearance of RIGHT empyema. 3. Increased atelectasis or early infiltrate in the LEFT LATERAL lung base. Electronically Signed   By: Nolon Nations M.D.   On: 09/21/2020 18:33   DG CHEST PORT 1 VIEW  Result Date: 09/20/2020 CLINICAL DATA:  Respiratory failure. EXAM: PORTABLE CHEST 1 VIEW COMPARISON:  09/19/2020. FINDINGS: Endotracheal tube, NG tube, right subclavian line in stable position. Interim removal of right chest tube. Persistent air collection over the right lower chest most likely in the pleural space possibly related empyema. Persistent right lower lobe infiltrate. Persistent mild left base subsegmental atelectasis. No evidence of increasing pleural effusion on  the right. Degenerative change thoracic spine. Evidence of old infarct in the proximal right humerus. IMPRESSION: 1. Interim removal of right chest tube. Persistent air collection over the right lower chest most likely in the pleural space possibly related to empyema. No interim change. 2. Persistent right lower lobe infiltrate. Persistent mild left base subsegmental atelectasis. No evidence of increasing pleural effusion on the right. Chest is unchanged from prior exam. Electronically Signed   By: Marcello Moores  Register   On: 09/20/2020 05:21   DG Chest Port 1 View  Result Date: 09/19/2020 CLINICAL DATA:  Hypoxia.  Recent cardiac arrest EXAM: PORTABLE CHEST 1 VIEW COMPARISON:  September 18, 2020 chest radiograph; chest CT September 14, 2020 FINDINGS: Endotracheal tube tip is 3.5 cm above the carina. Nasogastric tube tip and side port are below the diaphragm. Central catheter tip is in the superior vena cava. Chest tube is again noted on the right, unchanged in position. No appreciable pneumothorax. Pleural effusions bilaterally, larger on the right than on the left noted. Airspace consolidation right lower lung region with focal air along the lateral right hemithorax, likely due to empyema/developing abscess. There is less opacity in the  left base compared to 1 day prior with mild left base atelectasis noted. Heart borderline enlarged with pulmonary vascularity normal. There is aortic atherosclerosis. No adenopathy. No bone lesions. IMPRESSION: Tube and catheter positions as described without pneumothorax. Stable chest tube positioning. Bilateral pleural effusions with airspace opacity right lower lung region, stable. Air along the inferolateral right hemithorax is stable, likely due to empyema/developing abscess. Less opacity left base compared to 1 day prior with mild left base atelectasis present. Stable cardiac silhouette. Aortic Atherosclerosis (ICD10-I70.0). Electronically Signed   By: Lowella Grip III M.D.    On: 09/19/2020 08:55   DG Chest Port 1 View  Result Date: 09/18/2020 CLINICAL DATA:  Abnormal respiration.  Evaluate tube placement. EXAM: PORTABLE CHEST 1 VIEW COMPARISON:  Chest radiograph earlier today.  Chest CT 09/14/2020 FINDINGS: Left internal jugular line has been removed. Unchanged endotracheal tube tip 4.2 cm from the carina. Enteric tube in place with tip below the diaphragm not included in the field of view. Right subclavian central line tip in the SVC. Pigtail catheter at the right lung base is unchanged in position. Extrapleural air collection at the right lung base is not significantly changed, empyema demonstrated on CT. No pneumothorax. Left pleural effusion and basilar airspace disease, not significantly changed. Additional multifocal airspace opacities are unchanged. Stable cardiomegaly. IMPRESSION: 1. Interval removal of left internal jugular line. Remaining support apparatus is unchanged. 2. Unchanged extrapleural air collection at the right lung base, empyema demonstrated on CT. 3. Unchanged left pleural effusion and basilar airspace disease. Additional multifocal airspace opacities are unchanged. 4. Stable cardiomegaly Electronically Signed   By: Keith Rake M.D.   On: 09/18/2020 21:17   DG Chest Port 1 View  Result Date: 09/18/2020 CLINICAL DATA:  Abnormal respirations. EXAM: PORTABLE CHEST 1 VIEW COMPARISON:  09/18/2020 FINDINGS: The endotracheal tube tip is stable above the carina. There is a nasogastric tube with tip below the GE junction. Cardiomediastinal contours are stable. No change in bilateral pulmonary opacities. Unchanged appearance of loculated gas and fluid collection overlying the inferolateral right lung base. Left pleural effusion is also unchanged. IMPRESSION: 1. No change in aeration to the lungs compared with previous exam. 2. Stable support apparatus. Electronically Signed   By: Kerby Moors M.D.   On: 09/18/2020 11:45   DG CHEST PORT 1 VIEW  Result  Date: 09/18/2020 CLINICAL DATA:  Sudden onset shortness of breath EXAM: PORTABLE CHEST 1 VIEW COMPARISON:  09/17/2020 FINDINGS: Tubing overlies the left chest and mediastinum and is presumably external. Persistent right greater left pulmonary opacities. Right pleural gas and fluid collection appears similar. Stable cardiomediastinal contours. IMPRESSION: Similar lung aeration with right greater than left opacities. Right pleural gas and fluid collection also appears similar. Electronically Signed   By: Macy Mis M.D.   On: 09/18/2020 08:09   DG Chest Port 1 View  Result Date: 09/17/2020 CLINICAL DATA:  Dyspnea EXAM: PORTABLE CHEST 1 VIEW COMPARISON:  09/14/2020 FINDINGS: Lung aeration is similar with bilateral pulmonary opacities persisting. There is greater opacification at the right lung base. Right pleural gas and fluid collection appears slightly increased. Similar cardiomediastinal contours. IMPRESSION: Slight increase in right pleural gas and fluid collection. Persistent bilateral pulmonary opacities with greater opacification at the right lung base. Electronically Signed   By: Macy Mis M.D.   On: 09/17/2020 09:06   DG Chest Portable 1 View  Result Date: 09/14/2020 CLINICAL DATA:  Shortness of breath. EXAM: PORTABLE CHEST 1 VIEW COMPARISON:  08/26/2020 radiographs and 08/27/2020  CT chest. FINDINGS: Bilateral patchy airspace opacities, similar to prior. Similar appearance of a chronic right pleural fluid collection with internal gas. Suspect layering left pleural effusion. No discernible pneumothorax. Cardiac silhouette is enlarged. No acute osseous abnormality. Aortic atherosclerosis. IMPRESSION: 1. Similar bilateral airspace opacities, compatible with multifocal pneumonia. 2. Suspected layering left pleural effusion and similar chronic right pleural effusion with internal gas. Electronically Signed   By: Margaretha Sheffield MD   On: 09/14/2020 13:30   DG Chest Port 1 View  Result Date:  08/26/2020 CLINICAL DATA:  COVID pneumonia. EXAM: PORTABLE CHEST 1 VIEW COMPARISON:  08/20/2020 FINDINGS: There are worsening bilateral hazy airspace opacities. There are growing bilateral pleural effusions with a persistent right-sided hydropneumothorax. No evidence for left-sided pneumothorax. The heart size remains stable. There is no acute osseous abnormality. Atherosclerotic changes are again noted. IMPRESSION: 1. Worsening multifocal airspace opacities. 2. Persistent right-sided hydropneumothorax. 3. Worsening bilateral pleural effusions. Electronically Signed   By: Constance Holster M.D.   On: 08/26/2020 22:51   DG Foot 2 Views Left  Result Date: 09/15/2020 CLINICAL DATA:  Heel wound for several months, no known injury, initial encounter EXAM: LEFT FOOT - 2 VIEW COMPARISON:  08/09/2020 FINDINGS: Soft tissue wound is noted along the posterior aspect of the calcaneus. Calcaneal spurs are noted. No bony destruction is noted to suggest osteomyelitis. Degenerative changes of the tarsal bones are seen. Mild osteopenia is noted similar to that seen on the prior study. Forefoot soft tissue swelling is noted as well stable from the previous exam. IMPRESSION: Stable soft tissue swelling. No bony erosive changes to suggest osteomyelitis. Stable heel wound Electronically Signed   By: Inez Catalina M.D.   On: 09/15/2020 16:28   DG Swallowing Func-Speech Pathology  Result Date: 09/23/2020 Objective Swallowing Evaluation: Type of Study: MBS-Modified Barium Swallow Study  Patient Details Name: VIRAAJ VORNDRAN MRN: [id number] Date of Birth: Apr 23, 1946 Today's Date: 09/23/2020 Time: SLP Start Time (ACUTE ONLY): 1059 -SLP Stop Time (ACUTE ONLY): 1120 SLP Time Calculation (min) (ACUTE ONLY): 21 min Past Medical History: Past Medical History: Diagnosis Date . Amputee  . Aortic atherosclerosis ([place])  . Arthritis   "joints; shoulders, knees, hands, back" (05/21/2018) . Atherosclerosis of coronary artery  . C. difficile  diarrhea 04/2018 . Diastolic CHF ([place])  . Diverticulosis  . High cholesterol  . History of gout  . Hypertension  . IDA (iron deficiency anemia)   from referral Dr Daphene Jaeger . Internal hemorrhoids  . Peripheral vascular disease ([place])  . Pneumonia   "couple times" (05/21/2018) . Sleep apnea   "has mask; won't use" (05/21/2018) . Type II diabetes mellitus ([place])  Past Surgical History: Past Surgical History: Procedure Laterality Date . ABDOMINAL AORTOGRAM W/LOWER EXTREMITY N/A 11/01/2018  Procedure: ABDOMINAL AORTOGRAM W/LOWER EXTREMITY;  Surgeon: Lorretta Harp, MD;  Location: [facility];  Service: Cardiovascular;  Laterality: N/A; . ABDOMINAL AORTOGRAM W/LOWER EXTREMITY Left 06/20/2020  Procedure: ABDOMINAL AORTOGRAM W/LOWER EXTREMITY;  Surgeon: Marty Heck, MD;  Location: [facility];  Service: Cardiovascular;  Laterality: Left; . AMPUTATION Right 11/09/2018  Procedure: RIGHT - AMPUTATION ABOVE KNEE;  Surgeon: Waynetta Sandy, MD;  Location: [facility];  Service: Vascular;  Laterality: Right; . CATARACT EXTRACTION W/ INTRAOCULAR LENS  IMPLANT, BILATERAL Bilateral  . CHOLECYSTECTOMY  05/21/2018  ATTEMPTED LAPAROSCOPIC CHOLECYSTECTOMY, OPEN DRAINAGE OF GALLBLADDER WITH BIOPSY . CHOLECYSTECTOMY N/A 05/21/2018  Procedure: ATTEMPTED LAPAROSCOPIC CHOLECYSTECTOMY, OPEN DRAINAGE OF GALLBLADDER WITH BIOPSY;  Surgeon: Jovita Kussmaul, MD;  Location: [facility]  OR;  Service: General;  Laterality: N/A; . COLONOSCOPY W/ POLYPECTOMY   . COLONOSCOPY WITH PROPOFOL N/A 09/16/2018  Procedure: COLONOSCOPY WITH PROPOFOL;  Surgeon: Wilford Corner, MD;  Location: [facility];  Service: Endoscopy;  Laterality: N/A; . 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 FLEXIBLE SIGMOIDOSCOPY N/A 04/20/2020  Procedure: FLEXIBLE SIGMOIDOSCOPY;  Surgeon: Rush Landmark Telford Nab., MD;  Location: [facility];  Service:  Gastroenterology;  Laterality: N/A;  Fecal Disimpaction . IR CATHETER TUBE CHANGE  04/14/2018 . IR CHOLANGIOGRAM EXISTING TUBE  03/17/2018 . IR PERC CHOLECYSTOSTOMY  01/31/2018 . IR RADIOLOGIST EVAL & MGMT  03/02/2018 . PERIPHERAL VASCULAR INTERVENTION Left 06/20/2020  Procedure: PERIPHERAL VASCULAR INTERVENTION;  Surgeon: Marty Heck, MD;  Location: [facility];  Service: Cardiovascular;  Laterality: Left;  4 SFA STENTS HPI: 74 yo male resident of [place] with medical history significant of peripheral vascular disease, s/p right AKI, chronic diastolic heart failure, hypertension type 2 diabetes mellitus presented with dyspnea and hypoxia was in hospital from 08/09/20 to 08/21/20 with COVID 19 and aspiration pneumonia complicated by Rt pleural effusion treated with pig tail catheter drainage.  He was then in hospital from 08/26/20 to 08/30/20 with HCAP.  He presented to ER again on 09/14/20 with dyspnea from HCAP and Rt pleural effusion. Pt evaluated by SLP on 10/23 and found to have no signs of dysphagia. On 10/26 pt developed respiratory distress and cardiac arrest and was intubated until 10/28. Again on 10/29 pt had some respiratory distress. There was concern for aspiration.  Subjective: pleasant, son in room with him, upset he is not able to go to wife's funeral today Assessment / Plan / Recommendation CHL IP CLINICAL IMPRESSIONS 09/23/2020 Clinical Impression Pt presented with pharyngeal dysphagia characterized by mildly reduced anterior laryngeal movement and reduced duration of cricopharyngeal relaxation which resulted in pyriform sinus residue across consistencies.No functional benefit was noted with postural modifications. A liquid wash reduced, but did not eliminate, residue and pt was unable to demonstrate a mendelsohn maneuver. Penetration (PAS 2) was noted with consecutive swallows of thin liquids, but this is considered to be WNL and no instances of aspiration were demonstrated. Pt denied  sensation of pharyngeal residue throughout the study. Due to the pt's recent episode of "choking", the potential impact of pyriform sinus residue on safety is considered. A dysphagia 3 diet with thin liquids is recommended at this time with observance of swallowing precautions. SLP will follow for diet tolerance.  SLP Visit Diagnosis Dysphagia, unspecified (R13.10) Attention and concentration deficit following -- Frontal lobe and executive function deficit following -- Impact on safety and function Moderate aspiration risk   CHL IP TREATMENT RECOMMENDATION 09/23/2020 Treatment Recommendations Therapy as outlined in treatment plan below   Prognosis 09/23/2020 Prognosis for Safe Diet Advancement Good Barriers to Reach Goals -- Barriers/Prognosis Comment -- CHL IP DIET RECOMMENDATION 09/23/2020 SLP Diet Recommendations Dysphagia 3 (Mech soft) solids;Thin liquid Liquid Administration via Cup;Straw Medication Administration Whole meds with liquid Compensations Slow rate;Small sips/bites;Follow solids with liquid Postural Changes Seated upright at 90 degrees;Remain semi-upright after after feeds/meals (Comment)   CHL IP OTHER RECOMMENDATIONS 09/23/2020 Recommended Consults -- Oral Care Recommendations Oral care BID Other Recommendations --   CHL IP FOLLOW UP RECOMMENDATIONS 09/23/2020 Follow up Recommendations Skilled Nursing facility   [facility] IP FREQUENCY AND DURATION 09/23/2020 Speech Therapy Frequency (ACUTE ONLY) min 2x/week Treatment Duration 2 weeks  CHL IP ORAL PHASE 09/23/2020 Oral Phase WFL Oral - Pudding Teaspoon -- Oral - Pudding Cup -- Oral - Honey Teaspoon -- Oral - Honey Cup -- Oral - Nectar Teaspoon -- Oral - Nectar Cup -- Oral - Nectar Straw -- Oral - Thin Teaspoon -- Oral - Thin Cup -- Oral - Thin Straw -- Oral - Puree -- Oral - Mech Soft -- Oral - Regular -- Oral - Multi-Consistency -- Oral - Pill -- Oral Phase - Comment --  CHL IP PHARYNGEAL PHASE 09/23/2020 Pharyngeal Phase Impaired Pharyngeal-  Pudding Teaspoon -- Pharyngeal -- Pharyngeal- Pudding Cup -- Pharyngeal -- Pharyngeal- Honey Teaspoon -- Pharyngeal -- Pharyngeal- Honey Cup -- Pharyngeal -- Pharyngeal- Nectar Teaspoon -- Pharyngeal -- Pharyngeal- Nectar Cup -- Pharyngeal -- Pharyngeal- Nectar Straw -- Pharyngeal -- Pharyngeal- Thin Teaspoon -- Pharyngeal -- Pharyngeal- Thin Cup -- Pharyngeal -- Pharyngeal- Thin Straw -- Pharyngeal -- Pharyngeal- Puree -- Pharyngeal -- Pharyngeal- Mechanical Soft -- Pharyngeal -- Pharyngeal- Regular -- Pharyngeal -- Pharyngeal- Multi-consistency -- Pharyngeal -- Pharyngeal- Pill -- Pharyngeal -- Pharyngeal Comment --  CHL IP CERVICAL ESOPHAGEAL PHASE 09/23/2020 Cervical Esophageal Phase Reduced duration of cricopharyngeal relaxation but otherwise WNL Pudding Teaspoon -- Pudding Cup -- Honey Teaspoon -- Honey Cup -- Nectar Teaspoon -- Nectar Cup -- Nectar Straw -- Thin Teaspoon -- Thin Cup -- Thin Straw -- Puree -- Mechanical Soft -- Regular -- Multi-consistency -- Pill -- Cervical Esophageal Comment -- Shanika I. Hardin Negus, [place], [place] Office number [phone] Pager [place] 09/23/2020, 12:52 PM              IR US CHEST  Result Date: 09/18/2020 CLINICAL DATA:  Evaluate pleural effusion for thoracentesis. EXAM: CHEST ULTRASOUND COMPARISON:  Chest radiograph 09/17/2020 FINDINGS: Small amount of right pleural fluid that may be slightly loculated. Limited percutaneous window for thoracentesis. IMPRESSION: Small right pleural effusion.  Thoracentesis not performed. Electronically Signed   By: Markus Daft M.D.   On: 09/18/2020 09:14   ECHOCARDIOGRAM LIMITED  Result Date: 09/18/2020    ECHOCARDIOGRAM LIMITED REPORT   Patient Name:   SHUBH CHIARA Date of Exam: 09/18/2020 Medical Rec #:  [id number]       Height:       69.0 in Accession #:    [id number]      Weight:       139.0 lb Date of Birth:  [birth year]       BSA:          1.770 m Patient Age:    12 years         BP:           148/61 mmHg Patient Gender: M               HR:           69 bpm. Exam Location:  Inpatient Procedure: Limited Echo, Limited Color Doppler and Cardiac Doppler STAT ECHO Indications:    Cardiac arrest I46.9  History:        Patient has prior history of Echocardiogram examinations, most                 recent 06/19/2020. Risk Factors:Hypertension, Diabetes and                 Dyslipidemia. Pleural effusion.  Sonographer:    Vickie Epley RDCS Referring Phys: [id number] Candee Furbish  Sonographer Comments: Echo performed with patient supine and on artificial respirator. IMPRESSIONS  1. Left ventricular  ejection fraction, by estimation, is 40 to 45%. The left ventricle has mildly decreased function. The left ventricle demonstrates regional wall motion abnormalities (see scoring diagram/findings for description). Left ventricular diastolic parameters are consistent with Grade II diastolic dysfunction (pseudonormalization). Elevated left atrial pressure. There is mild hypokinesis of the left ventricular, basal-mid inferior wall and inferolateral wall.  2. Right ventricular systolic function is normal. The right ventricular size is normal. Tricuspid regurgitation signal is inadequate for assessing PA pressure.  3. Moderate pericardial effusion. The pericardial effusion is surrounding the apex. There is no evidence of cardiac tamponade.  4. The mitral valve is normal in structure. Trivial mitral valve regurgitation.  5. The aortic valve is tricuspid. There is mild calcification of the aortic valve. There is mild thickening of the aortic valve. Aortic valve regurgitation is trivial. Moderate aortic valve stenosis.  6. The inferior vena cava is normal in size with <50% respiratory variability, suggesting right atrial pressure of 8 mmHg. Comparison(s): Prior images reviewed side by side. Changes from prior study are noted. The left ventricular function is significantly worse. Aortic valve gradients are lower  compared to the previous study due to decreased left ventricular systolic function. Although there is global LV hypokinesis, this appears to be disproportionately worse in the inferior and inferolateral wall. FINDINGS  Left Ventricle: Left ventricular ejection fraction, by estimation, is 40 to 45%. The left ventricle has mildly decreased function. The left ventricle demonstrates regional wall motion abnormalities. Mild hypokinesis of the left ventricular, basal-mid inferior wall and inferolateral wall. The left ventricular internal cavity size was normal in size. There is no left ventricular hypertrophy. Left ventricular diastolic parameters are consistent with Grade II diastolic dysfunction (pseudonormalization). Elevated left atrial pressure. Right Ventricle: The right ventricular size is normal. No increase in right ventricular wall thickness. Right ventricular systolic function is normal. Tricuspid regurgitation signal is inadequate for assessing PA pressure. Left Atrium: Left atrial size was normal in size. Right Atrium: Right atrial size was normal in size. Pericardium: A moderately sized pericardial effusion is present. The pericardial effusion is surrounding the apex. There is no evidence of cardiac tamponade. Mitral Valve: The mitral valve is normal in structure. Mild mitral annular calcification. Trivial mitral valve regurgitation. Aortic Valve: The aortic valve is tricuspid. There is mild calcification of the aortic valve. There is mild thickening of the aortic valve. Aortic valve regurgitation is trivial. Moderate aortic stenosis is present. Aortic valve mean gradient measures 16.0 mmHg. Aortic valve peak gradient measures 28.7 mmHg. Aortic valve area, by VTI measures 1.18 cm. Pulmonic Valve: The pulmonic valve was normal in structure. Pulmonic valve regurgitation is not visualized. Aorta: The aortic root and ascending aorta are structurally normal, with no evidence of dilitation. Venous: The inferior  vena cava is normal in size with less than 50% respiratory variability, suggesting right atrial pressure of 8 mmHg. IAS/Shunts: No atrial level shunt detected by color flow Doppler. LEFT VENTRICLE PLAX 2D LVIDd:         5.20 cm      Diastology LVIDs:         3.80 cm      LV e' medial:    3.57 cm/s LV PW:         0.80 cm      LV E/e' medial:  26.4 LV IVS:        0.80 cm      LV e' lateral:   3.65 cm/s LVOT diam:     2.10 cm  LV E/e' lateral: 25.8 LV SV:         74 LV SV Index:   42 LVOT Area:     3.46 cm  LV Volumes (MOD) LV vol d, MOD A2C: 132.0 ml LV vol d, MOD A4C: 90.6 ml LV vol s, MOD A2C: 82.0 ml LV vol s, MOD A4C: 51.3 ml LV SV MOD A2C:     50.0 ml LV SV MOD A4C:     90.6 ml LV SV MOD BP:      50.3 ml RIGHT VENTRICLE RV S prime:     10.80 cm/s TAPSE (M-mode): 1.8 cm LEFT ATRIUM         Index LA diam:    3.30 cm 1.86 cm/m  AORTIC VALVE AV Area (Vmax):    1.01 cm AV Area (Vmean):   1.00 cm AV Area (VTI):     1.18 cm AV Vmax:           268.00 cm/s AV Vmean:          188.000 cm/s AV VTI:            0.629 m AV Peak Grad:      28.7 mmHg AV Mean Grad:      16.0 mmHg LVOT Vmax:         78.41 cm/s LVOT Vmean:        54.147 cm/s LVOT VTI:          0.214 m LVOT/AV VTI ratio: 0.34  AORTA Ao Root diam: 2.90 cm MITRAL VALVE MV Area (PHT): 4.57 cm     SHUNTS MV Decel Time: 166 msec     Systemic VTI:  0.21 m MV E velocity: 94.30 cm/s   Systemic Diam: 2.10 cm MV A velocity: 101.00 cm/s MV E/A ratio:  0.93 Mihai Croitoru MD Electronically signed by Sanda Klein MD Signature Date/Time: 09/18/2020/2:55:51 PM    Final       Discharge Exam: Vitals:   09/25/20 0846 09/25/20 0911  BP: (!) 148/63 (!) 145/75  Pulse: 68 74  Resp:  15  Temp:  98.6 F (37 C)  SpO2:  100%   Vitals:   09/24/20 2342 09/25/20 0419 09/25/20 0846 09/25/20 0911  BP: (!) 128/59 (!) 149/61 (!) 148/63 (!) 145/75  Pulse: 60 62 68 74  Resp: _0 Temp: 98.4 F (36.9 C) 98.2 F (36.8 C)  98.6 F (37 C)  TempSrc: Oral Oral  Oral   SpO2: 99% 100%  100%  Weight:      Height:        General: Pt is alert, awake, not in acute distress Cardiovascular: RRR, S1/S2 +, no rubs, no gallops Respiratory: CTA bilaterally, no wheezing, no rhonchi Abdominal: Soft, NT, ND, bowel sounds + Extremities: no edema, no cyanosis    The results of significant diagnostics from this hospitalization (including imaging, microbiology, ancillary and laboratory) are listed below for reference.     Microbiology: Recent Results (from the past 240 hour(s))  Culture, respiratory (non-expectorated)     Status: None   Collection Time: 09/18/20 12:23 PM   Specimen: Bronchoalveolar Lavage; Respiratory  Result Value Ref Range Status   Specimen Description BRONCHIAL ALVEOLAR LAVAGE  Final   Special Requests NONE  Final   Gram Stain   Final    RARE WBC PRESENT, PREDOMINANTLY PMN RARE BUDDING YEAST SEEN    Culture   Final    RARE Normal respiratory flora-no Staph aureus or Pseudomonas seen Performed at  [facility], [place] [address] [place], [place] [address]    Report Status 09/20/2020 FINAL  Final  Fungus Culture With Stain     Status: None (Preliminary result)   Collection Time: 09/18/20 12:23 PM   Specimen: Bronchial Alveolar Lavage  Result Value Ref Range Status   Fungus Stain Final report  Final    Comment: (NOTE) Performed At: [facility] [place], [place] [id number] Rush Farmer MD [id number]    Fungus (Mycology) Culture PENDING  Incomplete   Fungal Source BRONCHIAL ALVEOLAR LAVAGE  Final    Comment: Performed at [facility], [place] [address], [place], [place] [address]  Fungus Culture Result     Status: None   Collection Time: 09/18/20 12:23 PM  Result Value Ref Range Status   Result 1 Comment  Final    Comment: (NOTE) KOH/Calcofluor preparation:  no fungus observed. Performed At: [facility] [place], [place] [id number] Rush Farmer MD [id number]    Fungus Culture With Stain     Status: None (Preliminary result)   Collection Time: 09/18/20  4:01 PM   Specimen: Pleural, Right  Result Value Ref Range Status   Fungus Stain Final report  Final    Comment: (NOTE) Performed At: [facility] [place], [place] [id number] Rush Farmer MD SJ:[id number]    Fungus (Mycology) Culture PENDING  Incomplete   Fungal Source FLUID  Final    Comment: RIGHT PLEURAL Performed at [facility], [place] [address] [place], [place] [address]   Gram stain     Status: None   Collection Time: 09/18/20  4:01 PM   Specimen: Fluid  Result Value Ref Range Status   Specimen Description FLUID PLEURAL RIGHT  Final   Special Requests BOTTLES DRAWN AEROBIC AND ANAEROBIC  Final   Gram Stain   Final    FEW WBC PRESENT,BOTH PMN AND MONONUCLEAR NO ORGANISMS SEEN Performed at [facility], [place] [address] [place], [place] [address]    Report Status 09/18/2020 FINAL  Final  Culture, body fluid-bottle     Status: None   Collection Time: 09/18/20  4:01 PM   Specimen: Fluid  Result Value Ref Range Status   Specimen Description FLUID PLEURAL RIGHT  Final   Special Requests NONE  Final   Culture   Final    NO GROWTH 5 DAYS Performed at [facility] [address], [place], [place] [address]    Report Status 09/23/2020 FINAL  Final  Fungus Culture Result     Status: None   Collection Time: 09/18/20  4:01 PM  Result Value Ref Range Status   Result 1 Comment  Final    Comment: (NOTE) KOH/Calcofluor preparation:  no fungus observed. Performed At: [facility] [place], [place] [id number] Rush Farmer MD BS:[id number]   MRSA PCR Screening     Status: None   Collection Time: 09/19/20 10:25 AM   Specimen: Nasal Mucosa; Nasopharyngeal  Result Value Ref Range Status   MRSA by PCR NEGATIVE NEGATIVE Final    Comment:        The GeneXpert MRSA Assay (FDA approved for NASAL specimens only), is one  component of a comprehensive MRSA colonization surveillance program. It is not intended to diagnose MRSA infection nor to guide or monitor treatment for MRSA infections. Performed at [facility], [place] [address] [place], [place] [address]   Respiratory Panel by RT PCR (  Flu A&B, Covid) - Nasopharyngeal Swab     Status: None   Collection Time: 09/25/20 10:19 AM   Specimen: Nasopharyngeal Swab  Result Value Ref Range Status   SARS Coronavirus 2 by RT PCR NEGATIVE NEGATIVE Final    Comment: (NOTE) SARS-CoV-2 target nucleic acids are NOT DETECTED.  The SARS-CoV-2 RNA is generally detectable in upper respiratoy specimens during the acute phase of infection. The lowest concentration of SARS-CoV-2 viral copies this assay can detect is 131 copies/mL. A negative result does not preclude SARS-Cov-2 infection and should not be used as the sole basis for treatment or other patient management decisions. A negative result may occur with  improper specimen collection/handling, submission of specimen other than nasopharyngeal swab, presence of viral mutation(s) within the areas targeted by this assay, and inadequate number of viral copies (<131 copies/mL). A negative result must be combined with clinical observations, patient history, and epidemiological information. The expected result is Negative.  Fact Sheet for Patients:  PinkCheek.be  Fact Sheet for Healthcare Providers:  GravelBags.it  This test is no t yet approved or cleared by the [place] FDA and  has been authorized for detection and/or diagnosis of SARS-CoV-2 by FDA under an Emergency Use Authorization (EUA). This EUA will remain  in effect (meaning this test can be used) for the duration of the COVID-19 declaration under Section 564(b)(1) of the Act, 21 U.S.C. section 360bbb-3(b)(1), unless the authorization is terminated or revoked sooner.     Influenza A  by PCR NEGATIVE NEGATIVE Final   Influenza B by PCR NEGATIVE NEGATIVE Final    Comment: (NOTE) The Xpert Xpress SARS-CoV-2/FLU/RSV assay is intended as an aid in  the diagnosis of influenza from Nasopharyngeal swab specimens and  should not be used as a sole basis for treatment. Nasal washings and  aspirates are unacceptable for Xpert Xpress SARS-CoV-2/FLU/RSV  testing.  Fact Sheet for Patients: PinkCheek.be  Fact Sheet for Healthcare Providers: GravelBags.it  This test is not yet approved or cleared by the [place] FDA and  has been authorized for detection and/or diagnosis of SARS-CoV-2 by  FDA under an Emergency Use Authorization (EUA). This EUA will remain  in effect (meaning this test can be used) for the duration of the  Covid-19 declaration under Section 564(b)(1) of the Act, 21  U.S.C. section 360bbb-3(b)(1), unless the authorization is  terminated or revoked. Performed at [facility], [place] [address], [place], [place] [address]      Labs: BNP (last 3 results) Recent Labs    08/13/20 0402 08/14/20 0859 09/14/20 1445  BNP 332.9* 284.3* 8,546.2*   Basic Metabolic Panel: Recent Labs  Lab 09/19/20 0710 09/19/20 0710 09/19/20 1955 09/20/20 0500 09/20/20 1700 09/20/20 1955 09/21/20 0500 09/22/20 0656 09/23/20 0325 09/24/20 0142  NA 142   < >  --  143  --   --  145 143 141 140  K 4.3   < >  --  3.2*  --   --  3.7 3.5 3.4* 3.8  CL 107   < >  --  108  --   --  105 100 98 99  CO2 21*   < >  --  25  --   --  30 29 33* 32  GLUCOSE 121*   < >  --  227*  --   --  136* 129* 171* 55*  BUN 69*   < >  --  57*  --   --  34* 25*  21 20  CREATININE 1.88*   < >  --  1.58*  --   --  1.32* 1.19 1.18 1.15  CALCIUM 8.8*   < >  --  8.6*  --   --  8.5* 8.6* 8.5* 8.5*  MG 2.3  --  2.2 2.0 1.7  --   --   --  1.4*  --   PHOS 2.5  --  2.1* 1.5* 5.6* 4.5  --   --   --   --    < > = values in this interval not  displayed.   Liver Function Tests: Recent Labs  Lab 09/23/20 0325 09/24/20 0142  AST 14* 15  ALT 41 33  ALKPHOS 152* 121  BILITOT 0.5 0.7  PROT 6.6 6.6  ALBUMIN 1.8* 1.7*   No results for input(s): LIPASE, AMYLASE in the last 168 hours. No results for input(s): AMMONIA in the last 168 hours. CBC: Recent Labs  Lab 09/19/20 0731 09/19/20 1955 09/20/20 0500 09/22/20 0656 09/23/20 0325 09/24/20 0142 09/25/20 0143  WBC 13.8*  --  13.4* 11.8* 11.9* 11.6* 12.3*  NEUTROABS 9.8*  --  9.6*  --  8.2* 7.6 8.6*  HGB 6.1*   < > 8.6* 9.5* 9.2* 9.5* 9.4*  HCT 19.6*   < > 26.5* 30.2* 29.2* 29.9* 29.7*  MCV 86.7  --  83.6 86.0 85.9 85.9 86.8  PLT 269  --  267 241 224 236 226   < > = values in this interval not displayed.   Cardiac Enzymes: No results for input(s): CKTOTAL, CKMB, CKMBINDEX, TROPONINI in the last 168 hours. BNP: Invalid input(s): POCBNP CBG: Recent Labs  Lab 09/24/20 0628 09/24/20 1147 09/24/20 1657 09/24/20 2050 09/25/20 0642  GLUCAP 139* 189* 216* 184* 91   D-Dimer No results for input(s): DDIMER in the last 72 hours. Hgb A1c No results for input(s): HGBA1C in the last 72 hours. Lipid Profile No results for input(s): CHOL, HDL, LDLCALC, TRIG, CHOLHDL, LDLDIRECT in the last 72 hours. Thyroid function studies No results for input(s): TSH, T4TOTAL, T3FREE, THYROIDAB in the last 72 hours.  Invalid input(s): FREET3 Anemia work up No results for input(s): VITAMINB12, FOLATE, FERRITIN, TIBC, IRON, RETICCTPCT in the last 72 hours. Urinalysis    Component Value Date/Time   COLORURINE YELLOW 08/09/2020 2226   APPEARANCEUR CLEAR 08/09/2020 2226   LABSPEC 1.011 08/09/2020 2226   PHURINE 7.0 08/09/2020 2226   GLUCOSEU NEGATIVE 08/09/2020 2226   HGBUR SMALL (A) 08/09/2020 2226   BILIRUBINUR NEGATIVE 08/09/2020 2226   KETONESUR NEGATIVE 08/09/2020 2226   PROTEINUR >=300 (A) 08/09/2020 2226   UROBILINOGEN 0.2 01/09/2009 1532   NITRITE NEGATIVE 08/09/2020 2226    LEUKOCYTESUR NEGATIVE 08/09/2020 2226   Sepsis Labs Invalid input(s): PROCALCITONIN,  WBC,  LACTICIDVEN Microbiology Recent Results (from the past 240 hour(s))  Culture, respiratory (non-expectorated)     Status: None   Collection Time: 09/18/20 12:23 PM   Specimen: Bronchoalveolar Lavage; Respiratory  Result Value Ref Range Status   Specimen Description BRONCHIAL ALVEOLAR LAVAGE  Final   Special Requests NONE  Final   Gram Stain   Final    RARE WBC PRESENT, PREDOMINANTLY PMN RARE BUDDING YEAST SEEN    Culture   Final    RARE Normal respiratory flora-no Staph aureus or Pseudomonas seen Performed at [facility], [address], [place], [place] [address]    Report Status 09/20/2020 FINAL  Final  Fungus Culture With Stain     Status: None (  Preliminary result)   Collection Time: 09/18/20 12:23 PM   Specimen: Bronchial Alveolar Lavage  Result Value Ref Range Status   Fungus Stain Final report  Final    Comment: (NOTE) Performed At: [facility] [place], [place] [id number] Rush Farmer MD [id number]    Fungus (Mycology) Culture PENDING  Incomplete   Fungal Source BRONCHIAL ALVEOLAR LAVAGE  Final    Comment: Performed at [facility], [place] [address], [place], [place] [address]  Fungus Culture Result     Status: None   Collection Time: 09/18/20 12:23 PM  Result Value Ref Range Status   Result 1 Comment  Final    Comment: (NOTE) KOH/Calcofluor preparation:  no fungus observed. Performed At: [facility] [place], [place] [id number] Rush Farmer MD [id number]   Fungus Culture With Stain     Status: None (Preliminary result)   Collection Time: 09/18/20  4:01 PM   Specimen: Pleural, Right  Result Value Ref Range Status   Fungus Stain Final report  Final    Comment: (NOTE) Performed At: [facility] [place], [place] [id number] Rush Farmer MD [id number]    Fungus (Mycology)  Culture PENDING  Incomplete   Fungal Source FLUID  Final    Comment: RIGHT PLEURAL Performed at [facility], [place] [address], [place], [place] [address]   Gram stain     Status: None   Collection Time: 09/18/20  4:01 PM   Specimen: Fluid  Result Value Ref Range Status   Specimen Description FLUID PLEURAL RIGHT  Final   Special Requests BOTTLES DRAWN AEROBIC AND ANAEROBIC  Final   Gram Stain   Final    FEW WBC PRESENT,BOTH PMN AND MONONUCLEAR NO ORGANISMS SEEN Performed at [facility], [place] [address] [place], [place] [address]    Report Status 09/18/2020 FINAL  Final  Culture, body fluid-bottle     Status: None   Collection Time: 09/18/20  4:01 PM   Specimen: Fluid  Result Value Ref Range Status   Specimen Description FLUID PLEURAL RIGHT  Final   Special Requests NONE  Final   Culture   Final    NO GROWTH 5 DAYS Performed at [facility] [address] [place], [place] [address]    Report Status 09/23/2020 FINAL  Final  Fungus Culture Result     Status: None   Collection Time: 09/18/20  4:01 PM  Result Value Ref Range Status   Result 1 Comment  Final    Comment: (NOTE) KOH/Calcofluor preparation:  no fungus observed. Performed At: [facility] [place], [place] [id number] Rush Farmer MD IO:[id number]   MRSA PCR Screening     Status: None   Collection Time: 09/19/20 10:25 AM   Specimen: Nasal Mucosa; Nasopharyngeal  Result Value Ref Range Status   MRSA by PCR NEGATIVE NEGATIVE Final    Comment:        The GeneXpert MRSA Assay (FDA approved for NASAL specimens only), is one component of a comprehensive MRSA colonization surveillance program. It is not intended to diagnose MRSA infection nor to guide or monitor treatment for MRSA infections. Performed at [facility], [place] [address], [place], [place] [address]   Respiratory Panel by RT PCR (Flu A&B, Covid) - Nasopharyngeal Swab     Status: None   Collection  Time: 09/25/20 10:19 AM   Specimen: Nasopharyngeal Swab  Result Value Ref Range Status  SARS Coronavirus 2 by RT PCR NEGATIVE NEGATIVE Final    Comment: (NOTE) SARS-CoV-2 target nucleic acids are NOT DETECTED.  The SARS-CoV-2 RNA is generally detectable in upper respiratoy specimens during the acute phase of infection. The lowest concentration of SARS-CoV-2 viral copies this assay can detect is 131 copies/mL. A negative result does not preclude SARS-Cov-2 infection and should not be used as the sole basis for treatment or other patient management decisions. A negative result may occur with  improper specimen collection/handling, submission of specimen other than nasopharyngeal swab, presence of viral mutation(s) within the areas targeted by this assay, and inadequate number of viral copies (<131 copies/mL). A negative result must be combined with clinical observations, patient history, and epidemiological information. The expected result is Negative.  Fact Sheet for Patients:  PinkCheek.be  Fact Sheet for Healthcare Providers:  GravelBags.it  This test is no t yet approved or cleared by the [place] FDA and  has been authorized for detection and/or diagnosis of SARS-CoV-2 by FDA under an Emergency Use Authorization (EUA). This EUA will remain  in effect (meaning this test can be used) for the duration of the COVID-19 declaration under Section 564(b)(1) of the Act, 21 U.S.C. section 360bbb-3(b)(1), unless the authorization is terminated or revoked sooner.     Influenza A by PCR NEGATIVE NEGATIVE Final   Influenza B by PCR NEGATIVE NEGATIVE Final    Comment: (NOTE) The Xpert Xpress SARS-CoV-2/FLU/RSV assay is intended as an aid in  the diagnosis of influenza from Nasopharyngeal swab specimens and  should not be used as a sole basis for treatment. Nasal washings and  aspirates are unacceptable for Xpert Xpress  SARS-CoV-2/FLU/RSV  testing.  Fact Sheet for Patients: PinkCheek.be  Fact Sheet for Healthcare Providers: GravelBags.it  This test is not yet approved or cleared by the [place] FDA and  has been authorized for detection and/or diagnosis of SARS-CoV-2 by  FDA under an Emergency Use Authorization (EUA). This EUA will remain  in effect (meaning this test can be used) for the duration of the  Covid-19 declaration under Section 564(b)(1) of the Act, 21  U.S.C. section 360bbb-3(b)(1), unless the authorization is  terminated or revoked. Performed at [facility], [place] [address] [place],  [address]      Time coordinating discharge: Over 30 minutes  SIGNED:   Darliss Cheney, MD  [facility] 09/25/2020, 12:44 PM  If 7PM-7AM, please contact night-coverage [email or web address]

## 2020-09-25 NOTE — TOC Transition Note (Addendum)
Transition of Care [facility]) - CM/SW Discharge Note   Patient Details  Name: Jeremiah [name] MRN: [id number] Date of Birth: 1946-08-20  Transition of [facility]) CM/SW Contact:  Eduard Roux, LCSWA Phone Number: 09/25/2020, 2:32 PM   Clinical Narrative:     Patient will DC to: [facility]  DC Date: 09/25/2020 Family Notified:Brian,son Transport VF:[facility]  Please make sure printed and signed script for tramadol goes with the patient. Per MD patient is ready for discharge. RN, patient, and facility notified of DC. Discharge Summary sent to facility. RN given number for [phone], Room 204-A. Ambulance transport requested for patient.   Clinical Social Worker signing off.  Antony Blackbird, MSW, LCSWA Clinical Social Worker    Final next level of care: Skilled Nursing Facility Barriers to Discharge: Barriers Resolved   Patient Goals and CMS Choice Patient states their goals for this hospitalization and ongoing recovery are:: "get back" CMS Medicare.gov Compare Post Acute Care list provided to::  (Pt is from [facility] and wants to return to [facility])    Discharge Placement PASRR number recieved: 09/17/20            Patient [facility] bed at: [facility] Patient to be transferred to facility by: [facility] Name of family member notified: Brian,son Patient and family notified of of transfer: 09/25/20  Discharge Plan and Services     Post Acute Care Choice: Skilled Nursing Facility                               Social Determinants of Health (SDOH) Interventions     Readmission Risk Interventions Readmission Risk Prevention Plan 08/13/2020 07/08/2018 07/08/2018  Transportation Screening Complete - Complete  Medication Review (RN Care Manager) Referral to Pharmacy - Complete  PCP or Specialist appointment within 3-5 days of discharge Complete - Not Complete  HRI or Home Care Consult Complete (No Data) Complete  SW Recovery Care/Counseling Consult  Complete Patient refused Not Complete  Palliative Care Screening Not Applicable - Not Complete  Comments - - (No Data)  Medication Reconcilation (Pharmacy) - - Not Complete  Skilled Nursing Facility Complete - Patient refused  Some recent data might be hidden

## 2020-09-25 NOTE — TOC Progression Note (Signed)
Transition of Care [facility]) - Progression Note    Patient Details  Name: Jeremiah [name] MRN: [id number] Date of Birth: 10/26/1946  Transition of Care [facility]) CM/SW Contact  Eduard Roux, [place] Phone Number: 09/25/2020, 12:45 PM  Clinical Narrative:    CSW received insurance authorization # [id number], referral # [id number] from 11/02-11/04. CSW has informed the SNF  Antony Blackbird, MSW, LCSWA Clinical Social Worker   Expected Discharge Plan: Skilled Nursing Facility Barriers to Discharge: Barriers Resolved  Expected Discharge Plan and Services Expected Discharge Plan: Skilled Nursing Facility     Post Acute Care Choice: Skilled Nursing Facility Living arrangements for the past 2 months: Single Family Home (Pt reports he was home prior to admission to [facility].) Expected Discharge Date: 09/25/20                                     Social Determinants of Health (SDOH) Interventions    Readmission Risk Interventions Readmission Risk Prevention Plan 08/13/2020 07/08/2018 07/08/2018  Transportation Screening Complete - Complete  Medication Review [facility]) Referral to Pharmacy - Complete  PCP or Specialist appointment within 3-5 days of discharge Complete - Not Complete  HRI or Home Care Consult Complete (No Data) Complete  SW Recovery Care/Counseling Consult Complete Patient refused Not Complete  Palliative Care Screening Not Applicable - Not Complete  Comments - - (No Data)  Medication Reconcilation (Pharmacy) - - Not Complete  Skilled Nursing Facility Complete - Patient refused  Some recent data might be hidden

## 2020-09-25 NOTE — Progress Notes (Signed)
Subjective:  Patient denies any chest pain or shortness of breath.     Objective:  Vital Signs in the last 24 hours: Temp:  [97.9 F (36.6 C)-98.6 F (37 C)] 98.6 F (37 C) (11/02 0911) Pulse Rate:  [60-74] 74 (11/02 0911) Resp:  [15-22] 15 (11/02 0911) BP: (128-149)/(56-75) 145/75 (11/02 0911) SpO2:  [82 %-100 %] 100 % (11/02 0911)  Intake/Output from previous day: 11/01 0701 - 11/02 0700 In: 200 [P.O.:200] Out: 1600 [Urine:1600] Intake/Output from this shift: No intake/output data recorded.  Physical Exam: Neck: no adenopathy, no carotid bruit, no JVD and supple, symmetrical, trachea midline Lungs: Decreased breath sounds at bases with faint rhonchi noted Heart: regular rate and rhythm, S1, S2 normal and 2/6 systolic murmur noted.  No S3 gallop Abdomen: soft, non-tender; bowel sounds normal; no masses,  no organomegaly Extremities: extremities normal, atraumatic, no cyanosis or edema and right above-the-knee amputation noted  Lab Results: Recent Labs    09/24/20 0142 09/25/20 0143  WBC 11.6* 12.3*  HGB 9.5* 9.4*  PLT 236 226   Recent Labs    09/23/20 0325 09/24/20 0142  NA 141 140  K 3.4* 3.8  CL 98 99  CO2 33* 32  GLUCOSE 171* 55*  BUN 21 20  CREATININE 1.18 1.15   No results for input(s): TROPONINI in the last 72 hours.  Invalid input(s): CK, MB Hepatic Function Panel Recent Labs    09/24/20 0142  PROT 6.6  ALBUMIN 1.7*  AST 15  ALT 33  ALKPHOS 121  BILITOT 0.7   No results for input(s): CHOL in the last 72 hours. No results for input(s): PROTIME in the last 72 hours.  Imaging: Imaging results have been reviewed and DG Chest 2 View  Result Date: 09/24/2020 CLINICAL DATA:  Shortness of breath. EXAM: CHEST - 2 VIEW COMPARISON:  September 21, 2020. FINDINGS: Stable cardiomegaly. Probable central pulmonary vascular congestion is noted. No pneumothorax is noted. Bilateral perihilar and basilar opacities are noted concerning for edema or atelectasis  with associated pleural effusions. Bony thorax is unremarkable. IMPRESSION: Stable cardiomegaly with central pulmonary vascular congestion. Bilateral perihilar and basilar opacities are noted concerning for edema or atelectasis with associated pleural effusions. Electronically Signed   By: Lupita Raider M.D.   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 Plan Continue present management. Increase ambulation as tolerated. Hopefully to skilled nursing facility/rehabilitation soon  LOS: 11 days    Rinaldo Cloud 09/25/2020, 12:43 PM

## 2020-09-25 NOTE — Progress Notes (Signed)
Attempted to call report x2. Spoke with Jan at reception and transferred this nurse to the nurses station where it rang approx 4x each attempt then went to voicemail.

## 2020-09-25 NOTE — Progress Notes (Signed)
Physical Therapy Treatment Patient Details Name: Jeremiah [name] MRN: [id number] DOB: Feb 14, 1946 Today's Date: 09/25/2020    History of Present Illness 74 y.o. male admitted from [facility] 10/22 with respiratory failure and PNA with empyema. 10/26 pt with respiratory arrest leading to cardiac arrest. Intubated 10/26-10/28. PMhx: recent Covid PNA admission with D/C to SNF 10/7. PVD, Rt AKA, CHF, HTN, DM    PT Comments    Patient progressing slowly towards PT goals. Continues to require Max A for bed mobiltiy and step by step cues for sequencing. Tolerated sitting EOB ~8 minutes with mod-max A for support. Performed there ex, activating core/trunk musculature, cervical AROM, trunk rotation and balance/finding midline. Pt requires repetition within session and demonstrates poor STM. Continue to recommend return to SNF. Will follow.    Follow Up Recommendations  SNF;Supervision/Assistance - 24 hour     Equipment Recommendations  None recommended by PT    Recommendations for Other Services       Precautions / Restrictions Precautions Precautions: Fall Precaution Comments: Rt AKA Restrictions Weight Bearing Restrictions: No    Mobility  Bed Mobility Overal bed mobility: Needs Assistance Bed Mobility: Supine to Sit;Sit to Supine     Supine to sit: Max assist;HOB elevated Sit to supine: Max assist;HOB elevated   General bed mobility comments: Step by step cues to reach for rail and able to bring LLE to EOB, assist with RLE, to scoot bottom and with trunk to get upright. Assist to bring LEs into bed to return to supine and lower trunk.  Transfers                 General transfer comment: pt denied lateral scoot from bed to chair  Ambulation/Gait                 Stairs             Wheelchair Mobility    Modified Rankin (Stroke Patients Only)       Balance Overall balance assessment: Needs assistance Sitting-balance support: Bilateral upper extremity  supported (foot supported) Sitting balance-Leahy Scale: Poor Sitting balance - Comments: pt with max -mod assist EOB with multimodal cues for midline, UE support and correction to midline. EOB grossly 8 minutes. Postural control: Posterior lean                                  Cognition Arousal/Alertness: Awake/alert Behavior During Therapy: WFL for tasks assessed/performed Overall Cognitive Status: No family/caregiver present to determine baseline cognitive functioning Area of Impairment: Safety/judgement;Awareness;Problem solving;Orientation;Following commands                       Following Commands: Follows one step commands inconsistently;Follows one step commands with increased time Safety/Judgement: Decreased awareness of safety;Decreased awareness of deficits Awareness: Emergent Problem Solving: Slow processing;Difficulty sequencing;Requires verbal cues;Requires tactile cues;Decreased initiation General Comments: increased time with lack of awareness for poor balance, poor STM.      Exercises General Exercises - Lower Extremity Ankle Circles/Pumps: AROM;Left;10 reps;Supine Quad Sets: Left;10 reps;Supine;Strengthening Heel Slides: AAROM;Left;10 reps;Supine Hip Flexion/Marching: PROM;Seated;AROM;Both;10 reps    General Comments General comments (skin integrity, edema, etc.): Left heel pressure sore wrapped in gaze today; prevalon boot donned. VSS on supplement 02.      Pertinent Vitals/Pain Pain Assessment: No/denies pain    Home Living  Prior Function            PT Goals (current goals can now be found in the care plan section) Progress towards PT goals: Progressing toward goals (slowly)    Frequency    Min 2X/week      PT Plan Current plan remains appropriate    Co-evaluation              AM-PAC PT "6 Clicks" Mobility   Outcome Measure  Help needed turning from your back to your side while in a  flat bed without using bedrails?: Total Help needed moving from lying on your back to sitting on the side of a flat bed without using bedrails?: Total Help needed moving to and from a bed to a chair (including a wheelchair)?: Total Help needed standing up from a chair using your arms (e.g., wheelchair or bedside chair)?: Total Help needed to walk in hospital room?: Total Help needed climbing 3-5 steps with a railing? : Total 6 Click Score: 6    End of Session Equipment Utilized During Treatment: Oxygen Activity Tolerance: Patient limited by fatigue Patient left: in bed;with call bell/phone within reach;with bed alarm set;Other (comment) (prevalon boot applied) Nurse Communication: Mobility status;Need for lift equipment PT Visit Diagnosis: Other abnormalities of gait and mobility (R26.89);Muscle weakness (generalized) (M62.81)     Time: 6812-7517 PT Time Calculation (min) (ACUTE ONLY): 19 min  Charges:  $Therapeutic Activity: 8-22 mins                     Vale Haven, PT, DPT Acute Rehabilitation Services Pager [phone] Office [phone]       [name] 09/25/2020, 10:30 AM

## 2020-09-25 NOTE — Discharge Instructions (Signed)
Acute Respiratory Failure, Adult  Acute respiratory failure occurs when there is not enough oxygen passing from your lungs to your body. When this happens, your lungs have trouble removing carbon dioxide from the blood. This causes your blood oxygen level to drop too low as carbon dioxide builds up. Acute respiratory failure is a medical emergency. It can develop quickly, but it is temporary if treated promptly. Your lung capacity, or how much air your lungs can hold, may improve with time, exercise, and treatment. What are the causes? There are many possible causes of acute respiratory failure, including:  Lung injury.  Chest injury or damage to the ribs or tissues near the lungs.  Lung conditions that affect the flow of air and blood into and out of the lungs, such as pneumonia, acute respiratory distress syndrome, and cystic fibrosis.  Medical conditions, such as strokes or spinal cord injuries, that affect the muscles and nerves that control breathing.  Blood infection (sepsis).  Inflammation of the pancreas (pancreatitis).  A blood clot in the lungs (pulmonary embolism).  A large-volume blood transfusion.  Burns.  Near-drowning.  Seizure.  Smoke inhalation.  Reaction to medicines.  Alcohol or drug overdose. What increases the risk? This condition is more likely to develop in people who have:  A blocked airway.  Asthma.  A condition or disease that damages or weakens the muscles, nerves, bones, or tissues that are involved in breathing.  A serious infection.  A health problem that blocks the unconscious reflex that is involved in breathing, such as hypothyroidism or sleep apnea.  A lung injury or trauma. What are the signs or symptoms? Trouble breathing is the main symptom of acute respiratory failure. Symptoms may also include:  Rapid breathing.  Restlessness or anxiety.  Skin, lips, or fingernails that appear blue (cyanosis).  Rapid heart  rate.  Abnormal heart rhythms (arrhythmias).  Confusion or changes in behavior.  Tiredness or loss of energy.  Feeling sleepy or having a loss of consciousness. How is this diagnosed? Your health care provider can diagnose acute respiratory failure with a medical history and physical exam. During the exam, your health care provider will listen to your heart and check for crackling or wheezing sounds in your lungs. Your may also have tests to confirm the diagnosis and determine what is causing respiratory failure. These tests may include:  Measuring the amount of oxygen in your blood (pulse oximetry). The measurement comes from a small device that is placed on your finger, earlobe, or toe.  Other blood tests to measure blood gases and to look for signs of infection.  Sampling your cerebral spinal fluid or tracheal fluid to check for infections.  Chest X-ray to look for fluid in spaces that should be filled with air.  Electrocardiogram (ECG) to look at the heart's electrical activity. How is this treated? Treatment for this condition usually takes places in a hospital intensive care unit (ICU). Treatment depends on what is causing the condition. It may include one or more treatments until your symptoms improve. Treatment may include:  Supplemental oxygen. Extra oxygen is given through a tube in the nose, a face mask, or a hood.  A device such as a continuous positive airway pressure (CPAP) or bi-level positive airway pressure (BiPAP or BPAP) machine. This treatment uses mild air pressure to keep the airways open. A mask or other device will be placed over your nose or mouth. A tube that is connected to a motor will deliver oxygen through the   mask.  Ventilator. This treatment helps move air into and out of the lungs. This may be done with a bag and mask or a machine. For this treatment, a tube is placed in your windpipe (trachea) so air and oxygen can flow to the lungs.  Extracorporeal  membrane oxygenation (ECMO). This treatment temporarily takes over the function of the heart and lungs, supplying oxygen and removing carbon dioxide. ECMO gives the lungs a chance to recover. It may be used if a ventilator is not effective.  Tracheostomy. This is a procedure that creates a hole in the neck to insert a breathing tube.  Receiving fluids and medicines.  Rocking the bed to help breathing. Follow these instructions at home:  Take over-the-counter and prescription medicines only as told by your health care provider.  Return to normal activities as told by your health care provider. Ask your health care provider what activities are safe for you.  Keep all follow-up visits as told by your health care provider. This is important. How is this prevented? Treating infections and medical conditions that may lead to acute respiratory failure can help prevent the condition from developing. Contact a health care provider if:  You have a fever.  Your symptoms do not improve or they get worse. Get help right away if:  You are having trouble breathing.  You lose consciousness.  Your have cyanosis or turn blue.  You develop a rapid heart rate.  You are confused. These symptoms may represent a serious problem that is an emergency. Do not wait to see if the symptoms will go away. Get medical help right away. Call your local emergency services (911 in the [place] Do not drive yourself to the hospital. This information is not intended to replace advice given to you by your health care provider. Make sure you discuss any questions you have with your health care provider. Document Revised: 10/23/2017 Document Reviewed: 05/28/2016 Elsevier Patient Education  2020 [employer]  

## 2020-09-26 LAB — GLUCOSE, CAPILLARY: Glucose-Capillary: 188 mg/dL — ABNORMAL HIGH (ref 70–99)

## 2020-09-27 ENCOUNTER — Encounter (HOSPITAL_COMMUNITY): Payer: Self-pay | Admitting: Emergency Medicine

## 2020-09-27 ENCOUNTER — Inpatient Hospital Stay (HOSPITAL_COMMUNITY): Payer: Medicare Other

## 2020-09-27 ENCOUNTER — Emergency Department (HOSPITAL_COMMUNITY): Payer: Medicare Other

## 2020-09-27 ENCOUNTER — Other Ambulatory Visit: Payer: Self-pay

## 2020-09-27 ENCOUNTER — Inpatient Hospital Stay (HOSPITAL_COMMUNITY)
Admission: EM | Admit: 2020-09-27 | Discharge: 2020-10-04 | DRG: 871 | Disposition: A | Discharge status: Skilled Nursing Facility | Payer: Medicare Other | Attending: Internal Medicine | Admitting: Internal Medicine

## 2020-09-27 DIAGNOSIS — I313 Pericardial effusion (noninflammatory): Secondary | ICD-10-CM | POA: Diagnosis present

## 2020-09-27 DIAGNOSIS — Z7902 Long term (current) use of antithrombotics/antiplatelets: Secondary | ICD-10-CM

## 2020-09-27 DIAGNOSIS — A419 Sepsis, unspecified organism: Secondary | ICD-10-CM | POA: Diagnosis present

## 2020-09-27 DIAGNOSIS — E785 Hyperlipidemia, unspecified: Secondary | ICD-10-CM | POA: Diagnosis present

## 2020-09-27 DIAGNOSIS — N170 Acute kidney failure with tubular necrosis: Secondary | ICD-10-CM | POA: Diagnosis present

## 2020-09-27 DIAGNOSIS — J9600 Acute respiratory failure, unspecified whether with hypoxia or hypercapnia: Secondary | ICD-10-CM | POA: Diagnosis present

## 2020-09-27 DIAGNOSIS — J9601 Acute respiratory failure with hypoxia: Principal | ICD-10-CM | POA: Diagnosis present

## 2020-09-27 DIAGNOSIS — Z8616 Personal history of COVID-19: Secondary | ICD-10-CM

## 2020-09-27 DIAGNOSIS — J9621 Acute and chronic respiratory failure with hypoxia: Secondary | ICD-10-CM | POA: Diagnosis present

## 2020-09-27 DIAGNOSIS — J9602 Acute respiratory failure with hypercapnia: Secondary | ICD-10-CM

## 2020-09-27 DIAGNOSIS — D631 Anemia in chronic kidney disease: Secondary | ICD-10-CM | POA: Diagnosis present

## 2020-09-27 DIAGNOSIS — I2510 Atherosclerotic heart disease of native coronary artery without angina pectoris: Secondary | ICD-10-CM | POA: Diagnosis present

## 2020-09-27 DIAGNOSIS — E1122 Type 2 diabetes mellitus with diabetic chronic kidney disease: Secondary | ICD-10-CM | POA: Diagnosis present

## 2020-09-27 DIAGNOSIS — Z7984 Long term (current) use of oral hypoglycemic drugs: Secondary | ICD-10-CM

## 2020-09-27 DIAGNOSIS — G9341 Metabolic encephalopathy: Secondary | ICD-10-CM | POA: Diagnosis present

## 2020-09-27 DIAGNOSIS — I5042 Chronic combined systolic (congestive) and diastolic (congestive) heart failure: Secondary | ICD-10-CM | POA: Diagnosis present

## 2020-09-27 DIAGNOSIS — M109 Gout, unspecified: Secondary | ICD-10-CM | POA: Diagnosis present

## 2020-09-27 DIAGNOSIS — N1832 Chronic kidney disease, stage 3b: Secondary | ICD-10-CM | POA: Diagnosis present

## 2020-09-27 DIAGNOSIS — Z9049 Acquired absence of other specified parts of digestive tract: Secondary | ICD-10-CM

## 2020-09-27 DIAGNOSIS — J9622 Acute and chronic respiratory failure with hypercapnia: Secondary | ICD-10-CM | POA: Diagnosis present

## 2020-09-27 DIAGNOSIS — Z7982 Long term (current) use of aspirin: Secondary | ICD-10-CM

## 2020-09-27 DIAGNOSIS — Z9119 Patient's noncompliance with other medical treatment and regimen: Secondary | ICD-10-CM

## 2020-09-27 DIAGNOSIS — E1151 Type 2 diabetes mellitus with diabetic peripheral angiopathy without gangrene: Secondary | ICD-10-CM | POA: Diagnosis present

## 2020-09-27 DIAGNOSIS — Z833 Family history of diabetes mellitus: Secondary | ICD-10-CM

## 2020-09-27 DIAGNOSIS — Z23 Encounter for immunization: Secondary | ICD-10-CM

## 2020-09-27 DIAGNOSIS — R6520 Severe sepsis without septic shock: Secondary | ICD-10-CM | POA: Diagnosis present

## 2020-09-27 DIAGNOSIS — M7989 Other specified soft tissue disorders: Secondary | ICD-10-CM | POA: Diagnosis not present

## 2020-09-27 DIAGNOSIS — Z681 Body mass index (BMI) 19 or less, adult: Secondary | ICD-10-CM | POA: Diagnosis not present

## 2020-09-27 DIAGNOSIS — R609 Edema, unspecified: Secondary | ICD-10-CM | POA: Diagnosis not present

## 2020-09-27 DIAGNOSIS — G4733 Obstructive sleep apnea (adult) (pediatric): Secondary | ICD-10-CM | POA: Diagnosis present

## 2020-09-27 DIAGNOSIS — Z8249 Family history of ischemic heart disease and other diseases of the circulatory system: Secondary | ICD-10-CM

## 2020-09-27 DIAGNOSIS — Y95 Nosocomial condition: Secondary | ICD-10-CM | POA: Diagnosis present

## 2020-09-27 DIAGNOSIS — L89626 Pressure-induced deep tissue damage of left heel: Secondary | ICD-10-CM | POA: Diagnosis present

## 2020-09-27 DIAGNOSIS — J690 Pneumonitis due to inhalation of food and vomit: Secondary | ICD-10-CM | POA: Diagnosis present

## 2020-09-27 DIAGNOSIS — E43 Unspecified severe protein-calorie malnutrition: Secondary | ICD-10-CM | POA: Diagnosis present

## 2020-09-27 DIAGNOSIS — Z87891 Personal history of nicotine dependence: Secondary | ICD-10-CM

## 2020-09-27 DIAGNOSIS — Z8674 Personal history of sudden cardiac arrest: Secondary | ICD-10-CM

## 2020-09-27 DIAGNOSIS — E119 Type 2 diabetes mellitus without complications: Secondary | ICD-10-CM | POA: Diagnosis not present

## 2020-09-27 DIAGNOSIS — Z20822 Contact with and (suspected) exposure to covid-19: Secondary | ICD-10-CM | POA: Diagnosis present

## 2020-09-27 DIAGNOSIS — N19 Unspecified kidney failure: Secondary | ICD-10-CM

## 2020-09-27 DIAGNOSIS — I130 Hypertensive heart and chronic kidney disease with heart failure and stage 1 through stage 4 chronic kidney disease, or unspecified chronic kidney disease: Secondary | ICD-10-CM | POA: Diagnosis present

## 2020-09-27 DIAGNOSIS — Z79891 Long term (current) use of opiate analgesic: Secondary | ICD-10-CM

## 2020-09-27 DIAGNOSIS — Z89611 Acquired absence of right leg above knee: Secondary | ICD-10-CM

## 2020-09-27 DIAGNOSIS — Z79899 Other long term (current) drug therapy: Secondary | ICD-10-CM

## 2020-09-27 DIAGNOSIS — Z95828 Presence of other vascular implants and grafts: Secondary | ICD-10-CM

## 2020-09-27 DIAGNOSIS — I700 Atherosclerosis of aorta: Secondary | ICD-10-CM | POA: Diagnosis present

## 2020-09-27 LAB — I-STAT ARTERIAL BLOOD GAS, ED
Acid-Base Excess: 2 mmol/L (ref 0.0–2.0)
Acid-Base Excess: 7 mmol/L — ABNORMAL HIGH (ref 0.0–2.0)
Bicarbonate: 30.3 mmol/L — ABNORMAL HIGH (ref 20.0–28.0)
Bicarbonate: 33.4 mmol/L — ABNORMAL HIGH (ref 20.0–28.0)
Calcium, Ion: 1.07 mmol/L — ABNORMAL LOW (ref 1.15–1.40)
Calcium, Ion: 1.16 mmol/L (ref 1.15–1.40)
HCT: 26 % — ABNORMAL LOW (ref 39.0–52.0)
HCT: 31 % — ABNORMAL LOW (ref 39.0–52.0)
Hemoglobin: 8.8 g/dL — ABNORMAL LOW (ref 13.0–17.0)
Hemoglobin: 10.5 g/dL — ABNORMAL LOW (ref 13.0–17.0)
O2 Saturation: 92 %
O2 Saturation: 91 %
Potassium: 3.9 mmol/L (ref 3.5–5.1)
Potassium: 4.1 mmol/L (ref 3.5–5.1)
Sodium: 144 mmol/L (ref 135–145)
Sodium: 140 mmol/L (ref 135–145)
TCO2: 32 mmol/L (ref 22–32)
TCO2: 35 mmol/L — ABNORMAL HIGH (ref 22–32)
pCO2 arterial: 68.5 mmHg (ref 32.0–48.0)
pCO2 arterial: 58.8 mmHg — ABNORMAL HIGH (ref 32.0–48.0)
pH, Arterial: 7.254 — ABNORMAL LOW (ref 7.350–7.450)
pH, Arterial: 7.362 (ref 7.350–7.450)
pO2, Arterial: 75 mmHg — ABNORMAL LOW (ref 83.0–108.0)
pO2, Arterial: 66 mmHg — ABNORMAL LOW (ref 83.0–108.0)

## 2020-09-27 LAB — CBC WITH DIFFERENTIAL/PLATELET
Abs Immature Granulocytes: 0 10*3/uL (ref 0.00–0.07)
Basophils Absolute: 0 10*3/uL (ref 0.0–0.1)
Basophils Relative: 0 %
Eosinophils Absolute: 0.3 10*3/uL (ref 0.0–0.5)
Eosinophils Relative: 1 %
HCT: 38 % — ABNORMAL LOW (ref 39.0–52.0)
Hemoglobin: 11.4 g/dL — ABNORMAL LOW (ref 13.0–17.0)
Lymphocytes Relative: 5 %
Lymphs Abs: 1.3 10*3/uL (ref 0.7–4.0)
MCH: 27.7 pg (ref 26.0–34.0)
MCHC: 30 g/dL (ref 30.0–36.0)
MCV: 92.2 fL (ref 80.0–100.0)
Monocytes Absolute: 1 10*3/uL (ref 0.1–1.0)
Monocytes Relative: 4 %
Neutro Abs: 23.5 10*3/uL — ABNORMAL HIGH (ref 1.7–7.7)
Neutrophils Relative %: 90 %
Platelets: 389 10*3/uL (ref 150–400)
RBC: 4.12 MIL/uL — ABNORMAL LOW (ref 4.22–5.81)
RDW: 17.5 % — ABNORMAL HIGH (ref 11.5–15.5)
WBC: 26.1 10*3/uL — ABNORMAL HIGH (ref 4.0–10.5)
nRBC: 0 % (ref 0.0–0.2)
nRBC: 0 /100 WBC

## 2020-09-27 LAB — URINALYSIS, ROUTINE W REFLEX MICROSCOPIC
Bilirubin Urine: NEGATIVE
Glucose, UA: NEGATIVE mg/dL
Hgb urine dipstick: NEGATIVE
Ketones, ur: NEGATIVE mg/dL
Nitrite: NEGATIVE
Protein, ur: 100 mg/dL — AB
Specific Gravity, Urine: 1.017 (ref 1.005–1.030)
pH: 5 (ref 5.0–8.0)

## 2020-09-27 LAB — ECHOCARDIOGRAM LIMITED
AV Mean grad: 15 mmHg
AV Peak grad: 24.2 mmHg
Ao pk vel: 2.46 m/s
Height: 69 in
Weight: 1975.32 oz

## 2020-09-27 LAB — LACTIC ACID, PLASMA
Lactic Acid, Venous: 1.2 mmol/L (ref 0.5–1.9)
Lactic Acid, Venous: 2.2 mmol/L (ref 0.5–1.9)

## 2020-09-27 LAB — COMPREHENSIVE METABOLIC PANEL
ALT: 26 U/L (ref 0–44)
AST: 17 U/L (ref 15–41)
Albumin: 2.2 g/dL — ABNORMAL LOW (ref 3.5–5.0)
Alkaline Phosphatase: 137 U/L — ABNORMAL HIGH (ref 38–126)
Anion gap: 9 (ref 5–15)
BUN: 27 mg/dL — ABNORMAL HIGH (ref 8–23)
CO2: 35 mmol/L — ABNORMAL HIGH (ref 22–32)
Calcium: 9.1 mg/dL (ref 8.9–10.3)
Chloride: 97 mmol/L — ABNORMAL LOW (ref 98–111)
Creatinine, Ser: 1.59 mg/dL — ABNORMAL HIGH (ref 0.61–1.24)
GFR, Estimated: 45 mL/min — ABNORMAL LOW (ref >60)
Glucose, Bld: 216 mg/dL — ABNORMAL HIGH (ref 70–99)
Potassium: 4.5 mmol/L (ref 3.5–5.1)
Sodium: 141 mmol/L (ref 135–145)
Total Bilirubin: 0.2 mg/dL — ABNORMAL LOW (ref 0.3–1.2)
Total Protein: 8.2 g/dL — ABNORMAL HIGH (ref 6.5–8.1)

## 2020-09-27 LAB — TYPE AND SCREEN
ABO/RH(D): B POS
Antibody Screen: NEGATIVE

## 2020-09-27 LAB — GLUCOSE, CAPILLARY
Glucose-Capillary: 126 mg/dL — ABNORMAL HIGH (ref 70–99)
Glucose-Capillary: 132 mg/dL — ABNORMAL HIGH (ref 70–99)

## 2020-09-27 LAB — RESPIRATORY PANEL BY RT PCR (FLU A&B, COVID)
Influenza A by PCR: NEGATIVE
Influenza B by PCR: NEGATIVE
SARS Coronavirus 2 by RT PCR: NEGATIVE

## 2020-09-27 LAB — RAPID URINE DRUG SCREEN, HOSP PERFORMED
Amphetamines: NOT DETECTED
Barbiturates: NOT DETECTED
Benzodiazepines: POSITIVE — AB
Cocaine: NOT DETECTED
Opiates: NOT DETECTED
Tetrahydrocannabinol: NOT DETECTED

## 2020-09-27 LAB — TROPONIN I (HIGH SENSITIVITY)
Troponin I (High Sensitivity): 30 ng/L — ABNORMAL HIGH (ref <18)
Troponin I (High Sensitivity): 47 ng/L — ABNORMAL HIGH (ref <18)

## 2020-09-27 LAB — CBG MONITORING, ED: Glucose-Capillary: 178 mg/dL — ABNORMAL HIGH (ref 70–99)

## 2020-09-27 LAB — BRAIN NATRIURETIC PEPTIDE: B Natriuretic Peptide: 963.1 pg/mL — ABNORMAL HIGH (ref 0.0–100.0)

## 2020-09-27 LAB — ETHANOL: Alcohol, Ethyl (B): <10 mg/dL (ref <10)

## 2020-09-27 LAB — MRSA PCR SCREENING: MRSA by PCR: NEGATIVE

## 2020-09-27 MED ORDER — DOCUSATE SODIUM 50 MG/5ML PO LIQD: 100.0000 mg | Freq: Two times a day (BID) | ORAL | Status: DC | PRN

## 2020-09-27 MED ORDER — PHENYLEPHRINE 40 MCG/ML (10ML) SYRINGE FOR IV PUSH (FOR BLOOD PRESSURE SUPPORT)
PREFILLED_SYRINGE | INTRAVENOUS | Status: AC
  Filled 2020-09-27: qty 10

## 2020-09-27 MED ORDER — MIDAZOLAM HCL 2 MG/2ML IJ SOLN
1.0000 mg | INTRAMUSCULAR | Status: DC | PRN
  Administered 2020-09-27 (×2): 1 mg via INTRAVENOUS
  Filled 2020-09-27 (×2): qty 2

## 2020-09-27 MED ORDER — ASPIRIN 81 MG PO CHEW
81.0000 mg | CHEWABLE_TABLET | Freq: Every day | ORAL | Status: DC
  Administered 2020-09-27 – 2020-09-29 (×3): 81 mg
  Filled 2020-09-27 (×3): qty 1

## 2020-09-27 MED ORDER — FENTANYL CITRATE (PF) 100 MCG/2ML IJ SOLN
50.0000 ug | INTRAMUSCULAR | Status: DC | PRN
  Administered 2020-09-27 (×2): 50 ug via INTRAVENOUS
  Filled 2020-09-27 (×3): qty 2

## 2020-09-27 MED ORDER — CLOPIDOGREL BISULFATE 75 MG PO TABS: 75.0000 mg | ORAL_TABLET | Freq: Every day | ORAL | Status: DC

## 2020-09-27 MED ORDER — INSULIN ASPART 100 UNIT/ML ~~LOC~~ SOLN
0.0000 [IU] | SUBCUTANEOUS | Status: DC
  Administered 2020-09-27: 2 [IU] via SUBCUTANEOUS
  Administered 2020-09-27 (×2): 1 [IU] via SUBCUTANEOUS
  Administered 2020-09-28 (×2): 2 [IU] via SUBCUTANEOUS
  Administered 2020-09-29: 3 [IU] via SUBCUTANEOUS
  Administered 2020-09-29: 2 [IU] via SUBCUTANEOUS

## 2020-09-27 MED ORDER — ORAL CARE MOUTH RINSE
15.0000 mL | OROMUCOSAL | Status: DC
  Administered 2020-09-27 – 2020-09-29 (×17): 15 mL via OROMUCOSAL

## 2020-09-27 MED ORDER — VANCOMYCIN HCL IN DEXTROSE 1-5 GM/200ML-% IV SOLN
1000.0000 mg | Freq: Once | INTRAVENOUS | Status: AC
  Administered 2020-09-27: 1000 mg via INTRAVENOUS
  Filled 2020-09-27: qty 200

## 2020-09-27 MED ORDER — TIMOLOL MALEATE 0.5 % OP SOLN
1.0000 [drp] | Freq: Two times a day (BID) | OPHTHALMIC | Status: DC
  Administered 2020-09-27 – 2020-10-04 (×15): 1 [drp] via OPHTHALMIC
  Filled 2020-09-27: qty 5

## 2020-09-27 MED ORDER — VANCOMYCIN HCL 750 MG/150ML IV SOLN
750.0000 mg | INTRAVENOUS | Status: DC
  Filled 2020-09-27: qty 150

## 2020-09-27 MED ORDER — METRONIDAZOLE IN NACL 5-0.79 MG/ML-% IV SOLN
500.0000 mg | Freq: Three times a day (TID) | INTRAVENOUS | Status: DC
  Administered 2020-09-27 – 2020-09-28 (×3): 500 mg via INTRAVENOUS
  Filled 2020-09-27 (×3): qty 100

## 2020-09-27 MED ORDER — SODIUM CHLORIDE 0.9 % IV SOLN
2.0000 g | Freq: Two times a day (BID) | INTRAVENOUS | Status: AC
  Administered 2020-09-27 – 2020-10-02 (×10): 2 g via INTRAVENOUS
  Filled 2020-09-27 (×12): qty 2

## 2020-09-27 MED ORDER — LACTATED RINGERS IV BOLUS (SEPSIS)
250.0000 mL | Freq: Once | INTRAVENOUS | Status: AC
  Administered 2020-09-27: 250 mL via INTRAVENOUS

## 2020-09-27 MED ORDER — ROCURONIUM BROMIDE 50 MG/5ML IV SOLN
INTRAVENOUS | Status: AC | PRN
  Administered 2020-09-27: 70 mg via INTRAVENOUS

## 2020-09-27 MED ORDER — ALBUTEROL SULFATE (2.5 MG/3ML) 0.083% IN NEBU: 2.5000 mg | INHALATION_SOLUTION | RESPIRATORY_TRACT | Status: DC | PRN

## 2020-09-27 MED ORDER — PANTOPRAZOLE SODIUM 40 MG PO PACK
40.0000 mg | PACK | Freq: Every day | ORAL | Status: DC
  Administered 2020-09-27 – 2020-09-29 (×3): 40 mg
  Filled 2020-09-27 (×3): qty 20

## 2020-09-27 MED ORDER — ASPIRIN EC 81 MG PO TBEC: 81.0000 mg | DELAYED_RELEASE_TABLET | Freq: Every day | ORAL | Status: DC

## 2020-09-27 MED ORDER — DOCUSATE SODIUM 100 MG PO CAPS: 100.0000 mg | ORAL_CAPSULE | Freq: Two times a day (BID) | ORAL | Status: DC | PRN

## 2020-09-27 MED ORDER — CLOPIDOGREL BISULFATE 75 MG PO TABS
75.0000 mg | ORAL_TABLET | Freq: Every day | ORAL | Status: DC
  Administered 2020-09-28 – 2020-09-29 (×2): 75 mg
  Filled 2020-09-27 (×2): qty 1

## 2020-09-27 MED ORDER — SODIUM CHLORIDE 0.9 % IV BOLUS: 1000.0000 mL | Freq: Once | INTRAVENOUS | Status: DC

## 2020-09-27 MED ORDER — SODIUM CHLORIDE 0.9 % IV SOLN: 250.0000 mL | INTRAVENOUS | Status: DC

## 2020-09-27 MED ORDER — ACETAMINOPHEN 160 MG/5ML PO SOLN
500.0000 mg | Freq: Four times a day (QID) | ORAL | Status: DC | PRN
  Administered 2020-09-27 – 2020-10-02 (×3): 500 mg
  Filled 2020-09-27 (×3): qty 20.3

## 2020-09-27 MED ORDER — LACTATED RINGERS IV BOLUS (SEPSIS)
500.0000 mL | Freq: Once | INTRAVENOUS | Status: AC
  Administered 2020-09-27: 500 mL via INTRAVENOUS

## 2020-09-27 MED ORDER — BRIMONIDINE TARTRATE 0.2 % OP SOLN
1.0000 [drp] | Freq: Two times a day (BID) | OPHTHALMIC | Status: DC
  Administered 2020-09-27 – 2020-10-04 (×16): 1 [drp] via OPHTHALMIC
  Filled 2020-09-27: qty 5

## 2020-09-27 MED ORDER — SODIUM CHLORIDE 0.9 % IV SOLN
2.0000 g | Freq: Once | INTRAVENOUS | Status: AC
  Administered 2020-09-27: 2 g via INTRAVENOUS
  Filled 2020-09-27: qty 2

## 2020-09-27 MED ORDER — CHLORHEXIDINE GLUCONATE 0.12% ORAL RINSE (MEDLINE KIT)
15.0000 mL | Freq: Two times a day (BID) | OROMUCOSAL | Status: DC
  Administered 2020-09-27 – 2020-09-30 (×6): 15 mL via OROMUCOSAL

## 2020-09-27 MED ORDER — HEPARIN SODIUM (PORCINE) 5000 UNIT/ML IJ SOLN
5000.0000 [IU] | Freq: Three times a day (TID) | INTRAMUSCULAR | Status: DC
  Administered 2020-09-27 – 2020-10-04 (×22): 5000 [IU] via SUBCUTANEOUS
  Filled 2020-09-27 (×22): qty 1

## 2020-09-27 MED ORDER — SODIUM CHLORIDE 0.9 % IV SOLN
INTRAVENOUS | Status: AC | PRN
  Administered 2020-09-27: 1000 mL via INTRAVENOUS

## 2020-09-27 MED ORDER — ETOMIDATE 2 MG/ML IV SOLN
INTRAVENOUS | Status: AC | PRN
  Administered 2020-09-27: 20 mg via INTRAVENOUS

## 2020-09-27 MED ORDER — POLYETHYLENE GLYCOL 3350 17 G PO PACK: 17.0000 g | PACK | Freq: Every day | ORAL | Status: DC | PRN

## 2020-09-27 MED ORDER — MIDAZOLAM HCL 2 MG/2ML IJ SOLN: 1.0000 mg | INTRAMUSCULAR | Status: DC | PRN

## 2020-09-27 MED ORDER — FENTANYL CITRATE (PF) 100 MCG/2ML IJ SOLN
50.0000 ug | INTRAMUSCULAR | Status: DC | PRN
  Administered 2020-09-27 – 2020-09-29 (×6): 50 ug via INTRAVENOUS
  Filled 2020-09-27 (×5): qty 2

## 2020-09-27 MED ORDER — NOREPINEPHRINE 4 MG/250ML-% IV SOLN
2.0000 ug/min | INTRAVENOUS | Status: DC
  Administered 2020-09-27: 2 ug/min via INTRAVENOUS
  Filled 2020-09-27: qty 250

## 2020-09-27 NOTE — H&P (Signed)
NAME:  Jeremiah [name], MRN:  [id number], DOB:  05-19-1946, LOS: 0 ADMISSION DATE:  09/27/2020, CONSULTATION DATE:  09/27/20 REFERRING MD:  Dr Criss Alvine, CHIEF COMPLAINT:  intubated  Brief History   74 yo black male with multiple comorbidities and recent hospitalizations re-presents after d/c 2 days ago with resp failure.  History of present illness   74 yo with pmh chronic /systolic diastolic HF, htn, hyperlipidemia, T2DM, CKD3b, R AKA, chronic loculated pleural effusion (not decortication candidate), pvd, cardiac arrest, repeated hospitalizations who presented today to [facility] via EMS from [facility].    All history is obtained from chart and ED records 2/2 pt's unresponsive and intubated state. Pt was just discharged to this facility on 11/2. He was found this morning unresponsive with sats in the 60's. He was placed on face mask at 10L which improved sats to 88% and pt began to arouse. Unfortunately oxygen requirement escalated to NRB and sats continued to decline and he became unresponsive again and ultimately required intubation.   Upon evaluation pt was found to have leukocytosis to 26, acute on chronic renal failure with baseline Cr 1.1 now 1.5  Past Medical History   Past Medical History:  Diagnosis Date  . Amputee   . Aortic atherosclerosis (HCC)   . Arthritis    "joints; shoulders, knees, hands, back" (05/21/2018)  . Atherosclerosis of coronary artery   . C. difficile diarrhea 04/2018  . Diastolic CHF (HCC)   . Diverticulosis   . High cholesterol   . History of gout   . Hypertension   . IDA (iron deficiency anemia)    from referral Dr Darleene Cleaver  . Internal hemorrhoids   . Peripheral vascular disease (HCC)   . Pneumonia    "couple times" (05/21/2018)  . Sleep apnea    "has mask; won't use" (05/21/2018)  . Type II diabetes mellitus (HCC)     Significant Hospital Events   Intubated 11/4  Consults:  Ccm   Procedures:  ett 11/4  Significant Diagnostic Tests:    10/26 echo: LVEF 40-45, grade II diastolic dysfunction, hypokinesis, moderate pericardial effusion without evidence of tamponade 11/4 cth: Atrophy with small vessel chronic ischemic changes of deep cerebral white matter.  No acute intracranial abnormalities.  Micro Data:  11/4 sars/flu: negative 11/4 blood:  11/4 resp:    Antimicrobials:  Cefepime 11/4->  vanc 11/4-> Interim history/subjective:  Just discharged 11/2 after lengthy hospitalization 2/2 pleural effusion and resp failure. Back with resp failure and ams.   Objective   Blood pressure 125/76, pulse 78, temperature (!) 96.4 F (35.8 C), temperature source Axillary, resp. rate (!) 32, height 5\' 9"  (1.753 m), weight 56 kg, SpO2 96 %.    Vent Mode: PCV FiO2 (%):  [50 %] 50 % Set Rate:  [30 bmp] 30 bmp PEEP:  [10 cmH20] 10 cmH20 Plateau Pressure:  [34 cmH20] 34 cmH20   Intake/Output Summary (Last 24 hours) at 09/27/2020 1254 Last data filed at 09/27/2020 1232 Gross per 24 hour  Intake 3500 ml  Output --  Net 3500 ml   Filed Weights   09/27/20 1131  Weight: 56 kg    Examination: General: no acute distress, sedated unresponsive on vent.  HEENT: NCAT, PERRLA, MMMP Lungs: + rhonchi with purulent secretions noted Cardiovascular: RRR, no m/g/r Abdomen: soft, NT,ND, BS+ Extremities: + edema, R AKA Skin: no rashes, warm and dry Neuro: purposeful movement in b/l ue and spont in LLE GU: deferred  Resolved Hospital Problem list  Assessment & Plan:  Acute hypoxic resp failure:  -s/p intubation -titrate vent -sat/sbt when able.  -resp cx pending although cxr actually improved from recent discharge however copious purulent secretions noted on exam -prn bronchodilators -check le dopplers, unable to get ctpa 2/2 renal function -chemoprophy Chronic osa intolerant of cpap -per chart review  Sepsis likely 2/2 pneumonia, hcap gp/gn suspected -pan cx -ordered resp cx -blood cx already sent -h/o cdiff but no  reported diarrhea -cxr actually improved from discharge film but copious secretions ntoed -cont empiric abx with cefepime and vanc.... adding flagyl with history of cdiff.   H/o hypertension:  -holding home meds  Acute encephalopathy 2/2 hypoxia:  -cont vent support -monitor with sat for improvement -cth thankfully negative for acute process  Chronic normocytic anemia: -no acute indication for transfusion -no overt bleeding noted -transfuse <7  AoCkd3b: -follow indices -follow uop -renal u/s -urine studies -ua with rare bacteria, neg nitirite  Chronic diastolic/systolic hf: -without acute exacerbation -monitor fluid status -will get limited echo for pericardial effusion with recent echo showing moderate although ekg not consistent with pericarditis and hemodynamic stable making tamponade unlikely.   T2dm:  -glucose monitoring  Goals of care:  -certainly need ongoing goals of care discussion with family in light of repeated recent hospitalizations and now multiple intubations. Per review of chart palliative has been previously involved but not well received by family who has been clear with their wishes to prolong life "they feel that living is better than not irregardless of the quality of that life" (per palliative consult 09/21/20).  Best practice:  Diet: tf Pain/Anxiety/Delirium protocol (if indicated): per protocol VAP protocol (if indicated): Per protocol DVT prophylaxis:  GI prophylaxis: ppi Glucose control: monitoring Mobility: bedrest Code Status: FULL Family Communication: pending Disposition: ICU  Labs   CBC: Recent Labs  Lab 09/22/20 0656 09/22/20 0656 09/23/20 0325 09/24/20 0142 09/25/20 0143 09/27/20 1107 09/27/20 1146  WBC 11.8*  --  11.9* 11.6* 12.3* 26.1*  --   NEUTROABS  --   --  8.2* 7.6 8.6* 23.5*  --   HGB 9.5*   < > 9.2* 9.5* 9.4* 11.4* 8.8*  HCT 30.2*   < > 29.2* 29.9* 29.7* 38.0* 26.0*  MCV 86.0  --  85.9 85.9 86.8 92.2  --   PLT  241  --  224 236 226 389  --    < > = values in this interval not displayed.    Basic Metabolic Panel: Recent Labs  Lab 09/20/20 1700 09/20/20 1955 09/21/20 0500 09/21/20 0500 09/22/20 0656 09/23/20 0325 09/24/20 0142 09/27/20 1107 09/27/20 1146  NA  --   --  145   < > 143 141 140 141 144  K  --   --  3.7   < > 3.5 3.4* 3.8 4.5 3.9  CL  --   --  105  --  100 98 99 97*  --   CO2  --   --  30  --  29 33* 32 35*  --   GLUCOSE  --   --  136*  --  129* 171* 55* 216*  --   BUN  --   --  34*  --  25* 21 20 27*  --   CREATININE  --   --  1.32*  --  1.19 1.18 1.15 1.59*  --   CALCIUM  --   --  8.5*  --  8.6* 8.5* 8.5* 9.1  --   MG 1.7  --   --   --   --  1.4*  --   --   --   PHOS 5.6* 4.5  --   --   --   --   --   --   --    < > = values in this interval not displayed.   GFR: Estimated Creatinine Clearance: 31.3 mL/min (A) (by C-G formula based on SCr of 1.59 mg/dL (H)). Recent Labs  Lab 09/23/20 0325 09/24/20 0142 09/25/20 0143 09/27/20 1107 09/27/20 1114  WBC 11.9* 11.6* 12.3* 26.1*  --   LATICACIDVEN  --   --   --   --  1.2    Liver Function Tests: Recent Labs  Lab 09/23/20 0325 09/24/20 0142 09/27/20 1107  AST 14* 15 17  ALT 41 33 26  ALKPHOS 152* 121 137*  BILITOT 0.5 0.7 0.2*  PROT 6.6 6.6 8.2*  ALBUMIN 1.8* 1.7* 2.2*   No results for input(s): LIPASE, AMYLASE in the last 168 hours. No results for input(s): AMMONIA in the last 168 hours.  ABG    Component Value Date/Time   PHART 7.254 (L) 09/27/2020 1146   PCO2ART 68.5 (HH) 09/27/2020 1146   PO2ART 75 (L) 09/27/2020 1146   HCO3 30.3 (H) 09/27/2020 1146   TCO2 32 09/27/2020 1146   O2SAT 92.0 09/27/2020 1146     Coagulation Profile: No results for input(s): INR, PROTIME in the last 168 hours.  Cardiac Enzymes: No results for input(s): CKTOTAL, CKMB, CKMBINDEX, TROPONINI in the last 168 hours.  HbA1C: Hgb A1c MFr Bld  Date/Time Value Ref Range Status  08/10/2020 12:33 AM 6.0 (H) 4.8 - 5.6 % Final     Comment:    (NOTE) Pre diabetes:          5.7%-6.4%  Diabetes:              >6.4%  Glycemic control for   <7.0% adults with diabetes   06/18/2020 03:59 PM 6.1 (H) 4.8 - 5.6 % Final    Comment:    (NOTE) Pre diabetes:          5.7%-6.4%  Diabetes:              >6.4%  Glycemic control for   <7.0% adults with diabetes     CBG: Recent Labs  Lab 09/24/20 1147 09/24/20 1657 09/24/20 2050 09/25/20 0642 09/25/20 1125  GLUCAP 189* 216* 184* 91 188*    Review of Systems:   Unobtainable 2/2 pt intubation  Past Medical History  He,  has a past medical history of Amputee, Aortic atherosclerosis (HCC), Arthritis, Atherosclerosis of coronary artery, C. difficile diarrhea (04/2018), Diastolic CHF (HCC), Diverticulosis, High cholesterol, History of gout, Hypertension, IDA (iron deficiency anemia), Internal hemorrhoids, Peripheral vascular disease (HCC), Pneumonia, Sleep apnea, and Type II diabetes mellitus (HCC).   Surgical History    Past Surgical History:  Procedure Laterality Date  . ABDOMINAL AORTOGRAM W/LOWER EXTREMITY N/A 11/01/2018   Procedure: ABDOMINAL AORTOGRAM W/LOWER EXTREMITY;  Surgeon: Runell GessBerry, Jonathan J, MD;  Location: [facility] INVASIVE CV LAB;  Service: Cardiovascular;  Laterality: N/A;  . ABDOMINAL AORTOGRAM W/LOWER EXTREMITY Left 06/20/2020   Procedure: ABDOMINAL AORTOGRAM W/LOWER EXTREMITY;  Surgeon: Cephus Shellinglark, Christopher J, MD;  Location: [facility] INVAS[facility] CV LAB;  Service: Cardiovascular;  Laterality: Left;  . AMPUTATION Right 11/09/2018   Procedure: RIGHT - AMPUTATION ABOVE KNEE;  Surgeon: Maeola Harmanain, Brandon Christopher, MD;  [facility];  Service: Vascular;  Laterality: Right;  . CATARACT EXTRACTION W/ INTRAOCULAR LENS  IMPLANT, BILATERAL Bilateral   . CHOLECYSTECTOMY  05/21/2018  ATTEMPTED LAPAROSCOPIC CHOLECYSTECTOMY, OPEN DRAINAGE OF GALLBLADDER WITH BIOPSY  . CHOLECYSTECTOMY N/A 05/21/2018   Procedure: ATTEMPTED LAPAROSCOPIC CHOLECYSTECTOMY, OPEN DRAINAGE OF GALLBLADDER  WITH BIOPSY;  Surgeon: Griselda Miner, MD;  Location: [facility] OR;  Service: General;  Laterality: N/A;  . COLONOSCOPY W/ POLYPECTOMY    . COLONOSCOPY WITH PROPOFOL N/A 09/16/2018   Procedure: COLONOSCOPY WITH PROPOFOL;  Surgeon: Charlott Rakes, MD;  Location: [facility] ENDOSCOPY;  Service: Endoscopy;  Laterality: N/A;  . 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 FLEXIBLE SIGMOIDOSCOPY N/A 04/20/2020   Procedure: FLEXIBLE SIGMOIDOSCOPY;  Surgeon: Meridee Score Netty Starring., MD;  Location: [facility];  Service: Gastroenterology;  Laterality: N/A;  Fecal Disimpaction  . IR CATHETER TUBE CHANGE  04/14/2018  . IR CHOLANGIOGRAM EXISTING TUBE  03/17/2018  . IR PERC CHOLECYSTOSTOMY  01/31/2018  . IR RADIOLOGIST EVAL & MGMT  03/02/2018  . PERIPHERAL VASCULAR INTERVENTION Left 06/20/2020   Procedure: PERIPHERAL VASCULAR INTERVENTION;  Surgeon: Cephus Shelling, MD;  Location: [facility] INVASIVE CV LAB;  Service: Cardiovascular;  Laterality: Left;  4 SFA STENTS     Social History   reports that he quit smoking about 36 years ago. His smoking use included cigarettes. He quit after 24.00 years of use. He has never used smokeless tobacco. He reports previous alcohol use. He reports that he does not use drugs.   Family History   His family history includes Diabetes in his brother, mother, and sister; Heart attack in his brother and father; Heart disease in his brother and father; Hypertension in his brother, mother, and sister.   Allergies No Known Allergies   Home Medications  Prior to Admission medications   Medication Sig Start Date End Date Taking? Authorizing Provider  acetaminophen (TYLENOL) 500 MG tablet Take 1,000 mg by mouth in the morning, at noon, and at bedtime.    Yes [provider]  amLODipine (NORVASC) 10 MG tablet Take 10 mg by mouth daily. 07/26/20  Yes [provider]    Ascorbic Acid (VITAMIN C PO) Take 1,000 mg by mouth daily.    Yes [provider]  aspirin 81 MG tablet Take 81 mg by mouth daily.   Yes [provider]  atorvastatin (LIPITOR) 20 MG tablet Take 1 tablet (20 mg total) by mouth daily. Patient taking differently: Take 20 mg by mouth at bedtime.  12/06/18  Yes Angiulli, Mcarthur Rossetti, PA-C  brimonidine (ALPHAGAN) 0.2 % ophthalmic solution Place 1 drop into both eyes 2 (two) times daily. 12/06/18  Yes Angiulli, Mcarthur Rossetti, PA-C  clopidogrel (PLAVIX) 75 MG tablet Take 1 tablet (75 mg total) by mouth daily with breakfast. 06/22/20  Yes Rhetta Mura, MD  ferrous sulfate 325 (65 FE) MG tablet Take 1 tablet (325 mg total) by mouth 2 (two) times daily with a meal. 06/28/20  Yes Zannie Cove, MD  gabapentin (NEURONTIN) 300 MG capsule Take 1 capsule (300 mg total) by mouth 2 (two) times daily. 06/28/20  Yes Zannie Cove, MD  glimepiride (AMARYL) 1 MG tablet Take 1 tablet (1 mg total) by mouth daily with breakfast. 12/06/18  Yes Angiulli, Mcarthur Rossetti, PA-C  hydrALAZINE (APRESOLINE) 100 MG tablet Take 0.5 tablets (50 mg total) by mouth 2 (two) times daily. 08/30/20  Yes Osvaldo Shipper, MD  metoprolol succinate (TOPROL-XL) 25 MG 24 hr tablet Take 1 tablet (25 mg total) by mouth daily. 08/30/20 09/29/20 Yes Osvaldo Shipper, MD  pantoprazole (PROTONIX) 40 MG tablet Take 1 tablet (40 mg total) by mouth daily. 08/21/20 09/27/20 Yes Zigmund Daniel., MD  psyllium (METAMUCIL) 58.6 % powder Take 1 packet by mouth daily.   Yes [provider]  timolol (TIMOPTIC) 0.5 % ophthalmic solution Place 1 drop into both eyes 2 (two) times daily. 12/06/18  Yes Angiulli, Mcarthur Rossetti, PA-C  glucose blood test strip Accu-Chek Aviva Plus test strips  Take 1 strip 3 times a day by miscell. route for 90 days.    [provider]  glucose blood test strip Accu-Chek Aviva Plus test strips  USE AS DIRECTED THREE TIMES DAILY.    [provider]   traMADol (ULTRAM) 50 MG tablet Take 1 tablet (50 mg total) by mouth 2 (two) times daily as needed for moderate pain. Patient not taking: Reported on 09/27/2020 09/25/20   Hughie Closs, MD     Critical care time: The patient is critically ill with multiple organ systems failure and requires high complexity decision making for assessment and support, frequent evaluation and titration of therapies, application of advanced monitoring technologies and extensive interpretation of multiple databases.  Critical care time 35 mins. This represents my time independent of the NP's/PA's/med students/residents time taking care of the pt. This is excluding procedures.     Briant Sites DO [facility] 09/27/2020, 12:54 PM

## 2020-09-27 NOTE — Progress Notes (Signed)
eLink is monitoring this sepsis. Thank you 

## 2020-09-27 NOTE — ED Notes (Signed)
Blood cultures were obtained prior to starting antibiotics

## 2020-09-27 NOTE — Progress Notes (Signed)
  Echocardiogram 2D Echocardiogram has been performed.  Tye Savoy 09/27/2020, 2:38 PM

## 2020-09-27 NOTE — ED Provider Notes (Signed)
[facility] Provider Note   CSN: [id number] Arrival date & time: 09/27/20  1040  LEVEL 5 CAVEAT - UNRESPONSIVE History Chief Complaint  Patient presents with  . Respiratory Distress    Jeremiah [name] is a 74 y.o. male.  HPI 74 year old male presents with altered mental status and hypoxia.  EMS provides the entire history because the patient is unresponsive.  They report that the facility states he was awake this morning and then when they went to check on him he was unresponsive.  Initial O2 sats in the 60s.  Came up with a nonrebreather into the 90s, though when EMS was transferring patient it was noted his O2 sat was around 80 on nonrebreather.  Patient seemed to be responding to voice for EMS but he is completely unresponsive for me.   Past Medical History:  Diagnosis Date  . Amputee   . Aortic atherosclerosis (HCC)   . Arthritis    "joints; shoulders, knees, hands, back" (05/21/2018)  . Atherosclerosis of coronary artery   . C. difficile diarrhea 04/2018  . Diastolic CHF (HCC)   . Diverticulosis   . High cholesterol   . History of gout   . Hypertension   . IDA (iron deficiency anemia)    from referral Dr Darleene CleaverAsenso  . Internal hemorrhoids   . Peripheral vascular disease (HCC)   . Pneumonia    "couple times" (05/21/2018)  . Sleep apnea    "has mask; won't use" (05/21/2018)  . Type II diabetes mellitus [facility](HCC)     Patient Active Problem List   Diagnosis Date Noted  . Acute hypoxemic respiratory failure (HCC) 09/27/2020  . Acute respiratory failure (HCC) 09/27/2020  . Protein-calorie malnutrition, severe 09/21/2020  . Palliative care by specialist   . Goals of care, counseling/discussion   . CAP (community acquired pneumonia) 08/27/2020  . Pneumonia 08/27/2020  . Acute respiratory failure with hypoxia (HCC)   . Chest tube in place   . Pneumonia due to COVID-19 virus 08/09/2020  . Acute metabolic encephalopathy 08/09/2020  .  Dysphagia 08/09/2020  . Weakness   . Acute lower UTI 06/25/2020  . Abdominal pain   . Gangrene (HCC) 06/18/2020  . Post-operative pain 04/21/2019  . Abnormality of gait 03/24/2019  . Hypoglycemia   . Hyperkalemia   . Hyponatremia   . Diabetic peripheral neuropathy (HCC)   . Labile blood glucose   . Enteritis due to Clostridium difficile   . Blood glucose abnormal   . Diarrhea   . Benign essential HTN   . Hypoalbuminemia due to protein-calorie malnutrition (HCC)   . Labile blood pressure   . 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 Postoperative pain   . Acute blood loss anemia   . Dyslipidemia   . Type 2 diabetes mellitus with peripheral neuropathy (HCC)   . S/P AKA (above knee amputation) (HCC) 11/12/2018  . Unilateral AKA, right (HCC)   . Hypertensive crisis   . Diabetes mellitus type 2 in nonobese (HCC)   . Pleural effusion 11/08/2018  . Fever   . Open wound of right foot   . Sepsis (HCC) 11/03/2018  . Altered mental status   . Acute cystitis without hematuria   . Leukocytosis   . Pressure injury of skin 07/08/2018  . Clostridium difficile colitis 07/07/2018  . Hypokalemia 07/07/2018  . HLD (hyperlipidemia) 07/07/2018  . Acute diverticulitis 06/04/2018  . Cholecystitis 05/24/2018  . Cholecystitis with cholelithiasis 05/21/2018  . Acute systolic heart failure (  HCC) 03/27/2018  . Diabetes mellitus without complication (HCC) 01/30/2018  . Hypertension 01/30/2018  . Acute cholecystitis 01/30/2018  . AKI (acute kidney injury) (HCC) 01/30/2018  . Normocytic anemia 01/30/2018  . PVD (peripheral vascular disease) (HCC) 09/05/2014    Past Surgical History:  Procedure Laterality Date  . ABDOMINAL AORTOGRAM W/LOWER EXTREMITY N/A 11/01/2018   Procedure: ABDOMINAL AORTOGRAM W/LOWER EXTREMITY;  Surgeon: Runell Gess, MD;  Location: [facility] INVASIVE CV LAB;  Service: Cardiovascular;  Laterality: N/A;  . ABDOMINAL AORTOGRAM W/LOWER EXTREMITY Left 06/20/2020   Procedure:  ABDOMINAL AORTOGRAM W/LOWER EXTREMITY;  Surgeon: Cephus Shelling, MD;  Location: [facility] INVASIVE CV LAB;  Service: Cardiovascular;  Laterality: Left;  . AMPUTATION Right 11/09/2018   Procedure: RIGHT - AMPUTATION ABOVE KNEE;  Surgeon: Maeola Harman, MD;  Location: [facility] OR;  Service: Vascular;  Laterality: Right;  . CATARACT EXTRACTION W/ INTRAOCULAR LENS  IMPLANT, BILATERAL Bilateral   . CHOLECYSTECTOMY  05/21/2018   ATTEMPTED LAPAROSCOPIC CHOLECYSTECTOMY, OPEN DRAINAGE OF GALLBLADDER WITH BIOPSY  . CHOLECYSTECTOMY N/A 05/21/2018   Procedure: ATTEMPTED LAPAROSCOPIC CHOLECYSTECTOMY, OPEN DRAINAGE OF GALLBLADDER WITH BIOPSY;  Surgeon: Griselda Miner, MD;  Location: [facility] OR;  Service: General;  Laterality: N/A;  . COLONOSCOPY W/ POLYPECTOMY    . COLONOSCOPY WITH PROPOFOL N/A 09/16/2018   Procedure: COLONOSCOPY WITH PROPOFOL;  Surgeon: Charlott Rakes, MD;  Location: [facility] ENDOSCOPY;  Service: Endoscopy;  Laterality: N/A;  . 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 FLEXIBLE SIGMOIDOSCOPY N/A 04/20/2020   Procedure: FLEXIBLE SIGMOIDOSCOPY;  Surgeon: Meridee Score Netty Starring., MD;  Location: [facility];  Service: Gastroenterology;  Laterality: N/A;  Fecal Disimpaction  . IR CATHETER TUBE CHANGE  04/14/2018  . IR CHOLANGIOGRAM EXISTING TUBE  03/17/2018  . IR PERC CHOLECYSTOSTOMY  01/31/2018  . IR RADIOLOGIST EVAL & MGMT  03/02/2018  . PERIPHERAL VASCULAR INTERVENTION Left 06/20/2020   Procedure: PERIPHERAL VASCULAR INTERVENTION;  Surgeon: Cephus Shelling, MD;  Location: [facility] INVASIVE CV LAB;  Service: Cardiovascular;  Laterality: Left;  4 SFA STENTS       Family History  Problem Relation Age of Onset  . Diabetes Mother   . Hypertension Mother   . Heart disease Father   . Heart attack Father   . Diabetes Sister   . Hypertension Sister   . Diabetes Brother   . Heart  disease Brother   . Hypertension Brother   . Heart attack Brother     Social History   Tobacco Use  . Smoking status: Former Smoker    Years: 24.00    Types: Cigarettes    Quit date: 11/25/1983    Years since quitting: 36.8  . Smokeless tobacco: Never Used  Vaping Use  . Vaping Use: Never used  Substance Use Topics  . Alcohol use: Not Currently  . Drug use: No    Home Medications Prior to Admission medications   Medication Sig Start Date End Date Taking? Authorizing Provider  aspirin 81 MG tablet Take 81 mg by mouth daily.   Yes [provider]  atorvastatin (LIPITOR) 20 MG tablet Take 1 tablet (20 mg total) by mouth daily. Patient taking differently: Take 20 mg by mouth at bedtime.  12/06/18  Yes Angiulli, Mcarthur Rossetti, PA-C  brimonidine (ALPHAGAN) 0.2 % ophthalmic solution Place 1 drop into both eyes 2 (two) times daily. 12/06/18  Yes Angiulli, Mcarthur Rossetti, PA-C  clopidogrel (PLAVIX) 75  MG tablet Take 1 tablet (75 mg total) by mouth daily with breakfast. 06/22/20  Yes Rhetta Mura, MD  ferrous sulfate 325 (65 FE) MG tablet Take 1 tablet (325 mg total) by mouth 2 (two) times daily with a meal. 06/28/20  Yes Zannie Cove, MD  gabapentin (NEURONTIN) 300 MG capsule Take 1 capsule (300 mg total) by mouth 2 (two) times daily. 06/28/20  Yes Zannie Cove, MD  glimepiride (AMARYL) 1 MG tablet Take 1 tablet (1 mg total) by mouth daily with breakfast. 12/06/18  Yes Angiulli, Mcarthur Rossetti, PA-C  hydrALAZINE (APRESOLINE) 100 MG tablet Take 0.5 tablets (50 mg total) by mouth 2 (two) times daily. 08/30/20  Yes Osvaldo Shipper, MD  metoprolol succinate (TOPROL-XL) 25 MG 24 hr tablet Take 1 tablet (25 mg total) by mouth daily. 08/30/20 09/29/20 Yes Osvaldo Shipper, MD  pantoprazole (PROTONIX) 40 MG tablet Take 1 tablet (40 mg total) by mouth daily. 08/21/20 09/27/20 Yes Zigmund Daniel., MD  psyllium (METAMUCIL) 58.6 % powder Take 1 packet by mouth daily.   Yes [provider]    timolol (TIMOPTIC) 0.5 % ophthalmic solution Place 1 drop into both eyes 2 (two) times daily. 12/06/18  Yes Angiulli, Mcarthur Rossetti, PA-C  glucose blood test strip Accu-Chek Aviva Plus test strips  Take 1 strip 3 times a day by miscell. route for 90 days.    [provider]  glucose blood test strip Accu-Chek Aviva Plus test strips  USE AS DIRECTED THREE TIMES DAILY.    [provider]    Allergies    Patient has no known allergies.  Review of Systems   Review of Systems  Unable to perform ROS: Patient unresponsive    Physical Exam Updated Vital Signs BP 102/63   Pulse 78   Temp (!) 96.4 F (35.8 C) (Axillary)   Resp (!) 6   Ht 5\' 9"  (1.753 m)   Wt 56 kg   SpO2 100%   BMI 18.23 kg/m   Physical Exam Vitals and nursing note reviewed.  Constitutional:      Appearance: He is well-developed.  HENT:     Head: Normocephalic and atraumatic.     Right Ear: External ear normal.     Left Ear: External ear normal.     Nose: Nose normal.  Eyes:     General:        Right eye: No discharge.        Left eye: No discharge.     Comments: Pupils are moderately dilated bilaterally  Cardiovascular:     Rate and Rhythm: Normal rate and regular rhythm.     Heart sounds: Normal heart sounds.  Pulmonary:     Breath sounds: Rhonchi present.     Comments: Poor respiratory effort. Abdominal:     Palpations: Abdomen is soft.     Tenderness: There is no abdominal tenderness.  Musculoskeletal:     Cervical back: Neck supple.     Comments: Right AKA  Skin:    General: Skin is warm and dry.  Neurological:     Comments: Completely unresponsive. Does not respond to painful stimuli/sternal rub  Psychiatric:        Mood and Affect: Mood is not anxious.     ED Results / Procedures / Treatments   Labs (all labs ordered are listed, but only abnormal results are displayed) Labs Reviewed  COMPREHENSIVE METABOLIC PANEL - Abnormal; Notable for the following components:       Result Value  Chloride 97 (*)    CO2 35 (*)    Glucose, Bld 216 (*)    BUN 27 (*)    Creatinine, Ser 1.59 (*)    Total Protein 8.2 (*)    Albumin 2.2 (*)    Alkaline Phosphatase 137 (*)    Total Bilirubin 0.2 (*)    GFR, Estimated 45 (*)    All other components within normal limits  BRAIN NATRIURETIC PEPTIDE - Abnormal; Notable for the following components:   B Natriuretic Peptide 963.1 (*)    All other components within normal limits  CBC WITH DIFFERENTIAL/PLATELET - Abnormal; Notable for the following components:   WBC 26.1 (*)    RBC 4.12 (*)    Hemoglobin 11.4 (*)    HCT 38.0 (*)    RDW 17.5 (*)    Neutro Abs 23.5 (*)    All other components within normal limits  URINALYSIS, ROUTINE W REFLEX MICROSCOPIC - Abnormal; Notable for the following components:   APPearance CLOUDY (*)    Protein, ur 100 (*)    Leukocytes,Ua TRACE (*)    Bacteria, UA RARE (*)    All other components within normal limits  RAPID URINE DRUG SCREEN, HOSP PERFORMED - Abnormal; Notable for the following components:   Benzodiazepines POSITIVE (*)    All other components within normal limits  LACTIC ACID, PLASMA - Abnormal; Notable for the following components:   Lactic Acid, Venous 2.2 (*)    All other components within normal limits  CBG MONITORING, ED - Abnormal; Notable for the following components:   Glucose-Capillary 178 (*)    All other components within normal limits  I-STAT ARTERIAL BLOOD GAS, ED - Abnormal; Notable for the following components:   pH, Arterial 7.254 (*)    pCO2 arterial 68.5 (*)    pO2, Arterial 75 (*)    Bicarbonate 30.3 (*)    Calcium, Ion 1.07 (*)    HCT 26.0 (*)    Hemoglobin 8.8 (*)    All other components within normal limits  I-STAT ARTERIAL BLOOD GAS, ED - Abnormal; Notable for the following components:   pCO2 arterial 58.8 (*)    pO2, Arterial 66 (*)    Bicarbonate 33.4 (*)    TCO2 35 (*)    Acid-Base Excess 7.0 (*)    HCT 31.0 (*)    Hemoglobin 10.5 (*)     All other components within normal limits  TROPONIN I (HIGH SENSITIVITY) - Abnormal; Notable for the following components:   Troponin I (High Sensitivity) 30 (*)    All other components within normal limits  TROPONIN I (HIGH SENSITIVITY) - Abnormal; Notable for the following components:   Troponin I (High Sensitivity) 47 (*)    All other components within normal limits  RESPIRATORY PANEL BY RT PCR (FLU A&B, COVID)  CULTURE, BLOOD (ROUTINE X 2)  CULTURE, BLOOD (ROUTINE X 2)  CULTURE, RESPIRATORY  LACTIC ACID, PLASMA  ETHANOL  I-STAT CHEM 8, ED  TYPE AND SCREEN    EKG EKG Interpretation  Date/Time:  Thursday September 27 2020 10:52:23 EDT Ventricular Rate:  78 PR Interval:    QRS Duration: 85 QT Interval:  390 QTC Calculation: 445 R Axis:   80 Text Interpretation: Sinus rhythm Atrial premature complex Probable left atrial enlargement ST/T changes similar to Oct 2021 Confirmed by Pricilla Loveless [phone]) on 09/27/2020 11:09:34 AM   Radiology CT Head Wo Contrast  Result Date: 09/27/2020 CLINICAL DATA:  Found unresponsive at rehab facility, oxygen desaturation in  the 60s%, delirium EXAM: CT HEAD WITHOUT CONTRAST TECHNIQUE: Contiguous axial images were obtained from the base of the skull through the vertex without intravenous contrast. Sagittal and coronal MPR images reconstructed from axial data set. COMPARISON:  08/09/2020 FINDINGS: Brain: Generalized atrophy. Normal ventricular morphology. No midline shift or mass effect. Small vessel chronic ischemic changes of deep cerebral white matter. No intracranial hemorrhage, mass lesion, evidence of acute infarction, or extra-axial fluid collection. Tiny parenchymal calcification noted centrally within LEFT pons unchanged. Vascular: Atherosclerotic calcifications of internal carotid and vertebral arteries at skull base Skull: Intact Sinuses/Orbits: Minimal fluid LEFT maxillary sinus. Other: Endotracheal and nasogastric tubes traverse oral cavity.  IMPRESSION: Atrophy with small vessel chronic ischemic changes of deep cerebral white matter. No acute intracranial abnormalities. Electronically Signed   By: Ulyses Southward M.D.   On: 09/27/2020 12:22   DG Chest Portable 1 View  Result Date: 09/27/2020 CLINICAL DATA:  Hypoxia. EXAM: PORTABLE CHEST 1 VIEW COMPARISON:  September 24, 2020. FINDINGS: Stable cardiomediastinal silhouette. Endotracheal and nasogastric tubes are in grossly good position. No pneumothorax is noted. Mild bibasilar atelectasis or edema are noted with bilateral pleural effusions, right greater than left. Bony thorax is unremarkable. IMPRESSION: Mild bibasilar atelectasis or edema are noted with bilateral pleural effusions, right greater than left. Endotracheal and nasogastric tubes are in grossly good position. Aortic Atherosclerosis (ICD10-I70.0). Electronically Signed   By: Lupita Raider M.D.   On: 09/27/2020 11:21   ECHOCARDIOGRAM LIMITED  Result Date: 09/27/2020    ECHOCARDIOGRAM LIMITED REPORT   Patient Name:   TERRIE GRAJALES Date of Exam: 09/27/2020 Medical Rec #:  [id number]       Height:       69.0 in Accession #:    [id number]      Weight:       123.5 lb Date of Birth:  08-22-1946       BSA:          1.683 m Patient Age:    74 years        BP:           100/60 mmHg Patient Gender: M               HR:           77 bpm. Exam Location:  Inpatient Procedure: Limited Echo, Limited Color Doppler and Cardiac Doppler STAT ECHO Indications:    Pericardial Effusion I31.3  History:        Patient has prior history of Echocardiogram examinations, most                 recent 09/18/2020. Risk Factors:Diabetes and Hypertension.  Sonographer:    Thurman Coyer RDCS (AE) Referring Phys: [id number] JESSICA MARSHALL IMPRESSIONS  1. Persistent moderate pericardial effusion predominantly around the LV apex. No evidence for a tamponade.  2. Left ventricular ejection fraction, by estimation, is 40 to 45%. The left ventricle has mildly decreased function.  The left ventricle demonstrates global hypokinesis.  3. Moderate pericardial effusion. The pericardial effusion is surrounding the apex. There is no evidence of cardiac tamponade.  4. There is severe calcifcation of the aortic valve. There is severe thickening of the aortic valve. Aortic valve regurgitation is moderate. FINDINGS  Left Ventricle: Left ventricular ejection fraction, by estimation, is 40 to 45%. The left ventricle has mildly decreased function. The left ventricle demonstrates global hypokinesis. Pericardium: A moderately sized pericardial effusion is present. The pericardial effusion is surrounding the apex. There is no  evidence of cardiac tamponade. Aortic Valve: There is severe calcifcation of the aortic valve. There is severe thickening of the aortic valve. Aortic valve regurgitation is moderate. Aortic valve mean gradient measures 15.0 mmHg. Aortic valve peak gradient measures 24.2 mmHg. AORTIC VALVE AV Vmax:           246.00 cm/s AV Vmean:          185.000 cm/s AV VTI:            0.467 m AV Peak Grad:      24.2 mmHg AV Mean Grad:      15.0 mmHg LVOT Vmax:         77.00 cm/s LVOT Vmean:        49.700 cm/s LVOT VTI:          0.153 m LVOT/AV VTI ratio: 0.33  SHUNTS Systemic VTI: 0.15 m Tobias Alexander MD Electronically signed by Tobias Alexander MD Signature Date/Time: 09/27/2020/3:21:31 PM    Final     Procedures Procedure Name: Intubation Date/Time: 09/27/2020 11:11 AM Performed by: Pricilla Loveless, MD Pre-anesthesia Checklist: Patient identified, Patient being monitored, Emergency Drugs available, Timeout performed and Suction available Oxygen Delivery Method: Ambu bag Preoxygenation: Pre-oxygenation with 100% oxygen Induction Type: Rapid sequence Ventilation: Mask ventilation with difficulty Laryngoscope Size: Glidescope and 3 Grade View: Grade II Tube size: 7.5 mm Number of attempts: 1 Airway Equipment and Method: Video-laryngoscopy Placement Confirmation: ETT inserted through  vocal cords under direct vision,  CO2 detector and Breath sounds checked- equal and bilateral Secured at: 23 cm Tube secured with: ETT holder Dental Injury: Teeth and Oropharynx as per pre-operative assessment      .Critical Care Performed by: Pricilla Loveless, MD Authorized by: Pricilla Loveless, MD   Critical care provider statement:    Critical care time (minutes):  45   Critical care time was exclusive of:  Separately billable procedures and treating other patients   Critical care was necessary to treat or prevent imminent or life-threatening deterioration of the following conditions:  Respiratory failure   Critical care was time spent personally by me on the following activities:  Discussions with consultants, evaluation of patient's response to treatment, examination of patient, ordering and performing treatments and interventions, ordering and review of laboratory studies, ordering and review of radiographic studies, pulse oximetry, re-evaluation of patient's condition, obtaining history from patient or surrogate and review of old charts   (including critical care time)  Medications Ordered in ED Medications  phenylephrine 0.4-0.9 MG/10ML-% injection (  Not Given 09/27/20 1359)  sodium chloride 0.9 % bolus 1,000 mL (0 mLs Intravenous Hold 09/27/20 1140)  fentaNYL (SUBLIMAZE) injection 50 mcg (50 mcg Intravenous Given 09/27/20 1251)  fentaNYL (SUBLIMAZE) injection 50 mcg (has no administration in time range)  midazolam (VERSED) injection 1 mg (1 mg Intravenous Given 09/27/20 1251)  midazolam (VERSED) injection 1 mg (has no administration in time range)  norepinephrine (LEVOPHED)  in premix infusion (3 mcg/min Intravenous Rate/Dose Change 09/27/20 1521)  ceFEPIme (MAXIPIME) 2 g in sodium chloride 0.9 % 100 mL IVPB (has no administration in time range)  vancomycin (VANCOREADY) IVPB 750 mg/150 mL (has no administration in time range)  docusate sodium (COLACE) capsule 100 mg (has no  administration in time range)  polyethylene glycol (MIRALAX / GLYCOLAX) packet 17 g (has no administration in time range)  heparin injection 5,000 Units (5,000 Units Subcutaneous Given 09/27/20 1409)  albuterol (PROVENTIL) (2.5 MG/3ML) 0.083% nebulizer solution 2.5 mg (has no administration in time range)  insulin aspart (novoLOG) injection 0-9 Units (0 Units Subcutaneous Not Given 09/27/20 1358)  metroNIDAZOLE (FLAGYL) IVPB 500 mg (500 mg Intravenous New Bag/Given 09/27/20 1408)  aspirin EC tablet 81 mg (81 mg Oral Not Given 09/27/20 1357)  brimonidine (ALPHAGAN) 0.2 % ophthalmic solution 1 drop (1 drop Both Eyes Given 09/27/20 1449)  clopidogrel (PLAVIX) tablet 75 mg (has no administration in time range)  timolol (TIMOPTIC) 0.5 % ophthalmic solution 1 drop (1 drop Both Eyes Given 09/27/20 1409)  pantoprazole sodium (PROTONIX) 40 mg/20 mL oral suspension 40 mg (40 mg Per Tube Given 09/27/20 1455)  etomidate (AMIDATE) injection (20 mg Intravenous Given 09/27/20 1102)  rocuronium (ZEMURON) injection (70 mg Intravenous Given 09/27/20 1102)  0.9 %  sodium chloride infusion ( Intravenous Stopped 09/27/20 1215)  vancomycin (VANCOCIN) IVPB 1000 mg/200 mL premix (0 mg Intravenous Stopped 09/27/20 1318)  ceFEPIme (MAXIPIME) 2 g in sodium chloride 0.9 % 100 mL IVPB (0 g Intravenous Stopped 09/27/20 1155)  lactated ringers bolus 500 mL (0 mLs Intravenous Stopped 09/27/20 1232)    And  lactated ringers bolus 250 mL (0 mLs Intravenous Stopped 09/27/20 1232)    ED Course  I have reviewed the triage vital signs and the nursing notes.  Pertinent labs & imaging results that were available during my care of the patient were reviewed by me and considered in my medical decision making (see chart for details).    MDM Rules/Calculators/A&P                          Patient has respiratory failure on arrival.  His sats are in the 70s on a nonrebreather.  Airway was repositioned and he was bag-valve-mask ventilated back up  into the 90s but this was difficult.  He did start to have soft blood pressures with this as well.  He was intubated as above for airway protection.  Appears to have both hypoxia and hypercarbia.  He was treated as possible pneumonia with broad antibiotics and treated per sepsis protocol with 30 cc/KG IV fluids.  He is now requiring a small amount of peripheral norepinephrine.  ICU has been consulted for admission.  I have updated his son.  It was noted after the fact of intubation that he has a MOST form but the son wants everything done. Final Clinical Impression(s) / ED Diagnoses Final diagnoses:  Acute respiratory failure with hypoxia and hypercapnia (HCC)    Rx / DC Orders ED Discharge Orders    None       Pricilla Loveless, MD 09/27/20 1606

## 2020-09-27 NOTE — ED Notes (Signed)
Successful intubation 23 at the lip

## 2020-09-27 NOTE — Progress Notes (Signed)
eLink Physician-Brief Progress Note Patient Name: Jeremiah [name] DOB: Nov 28, 1945 MRN: [id number]   Date of Service  09/27/2020  HPI/Events of Note  RN called to notify MD because PCCM note did not document unequal pupils. Patient has had unequal pupils that were noted on admission and prior admission. CT scan done at 12:13 PM today reveals trophy with small vessel chronic ischemic changes of deep cerebral white matter. There is nothing further that needs to be done at this time.   eICU Interventions  Continue present management.      Intervention Category Major Interventions: Change in mental status - evaluation and management  Embry Huss Eugene 09/27/2020, 9:11 PM

## 2020-09-27 NOTE — ED Notes (Signed)
sats 80% on 15L nonrebreather. Pt unresponsive. Preparing to intubate

## 2020-09-27 NOTE — Progress Notes (Signed)
Pharmacy Antibiotic Note  Jeremiah [name] is a 74 y.o. male admitted on 09/27/2020 with pneumonia.  Pharmacy has been consulted for Cefepime and vancomycin dosing.  WBC elevated at 26.1 SCr elevated at 1.59. LA 1.2   Plan: -Cefepime 2 gm IV Q 12 hours -Vancomycin 1 gm IV load followed by vancomycin 750 mg IV Q 24 hours -Monitor renal fx, cultures and clinical progress -VT as indicated      No data recorded.  Recent Labs  Lab 09/21/20 0500 09/22/20 0656 09/23/20 0325 09/24/20 0142 09/25/20 0143  WBC  --  11.8* 11.9* 11.6* 12.3*  CREATININE 1.32* 1.19 1.18 1.15  --     Estimated Creatinine Clearance: 43.7 mL/min (by C-G formula based on SCr of 1.15 mg/dL).    No Known Allergies  Antimicrobials this admission: Cefepime 11/4 >>  Vanc 11/4 >>   Dose adjustments this admission: None  Microbiology results: 11/4 BCx:    Thank you for allowing pharmacy to be a part of this patient's care.  Vinnie Level, PharmD., BCPS, BCCCP Clinical Pharmacist Please refer to [facility] for unit-specific pharmacist

## 2020-09-27 NOTE — ED Triage Notes (Signed)
Pt arrives from [facility]. Staff found pt unresponsive, sats on room air in the 60's. Placed him on simple face mask at 10L increased to 88%. EMS gave 5 mg albuterol. Pt wearing nonrebreather 15L- sats improved 94%. Eye opening to voice. 126/58, resp 28, HR 76.

## 2020-09-27 NOTE — Progress Notes (Signed)
Pt transported from ER to 81M 04 on full vent support. No complications noted.

## 2020-09-28 ENCOUNTER — Inpatient Hospital Stay (HOSPITAL_COMMUNITY): Payer: Medicare Other

## 2020-09-28 DIAGNOSIS — R609 Edema, unspecified: Secondary | ICD-10-CM

## 2020-09-28 DIAGNOSIS — M7989 Other specified soft tissue disorders: Secondary | ICD-10-CM | POA: Diagnosis not present

## 2020-09-28 DIAGNOSIS — J9601 Acute respiratory failure with hypoxia: Secondary | ICD-10-CM | POA: Diagnosis not present

## 2020-09-28 LAB — CBC
HCT: 27.8 % — ABNORMAL LOW (ref 39.0–52.0)
Hemoglobin: 8.9 g/dL — ABNORMAL LOW (ref 13.0–17.0)
MCH: 27.1 pg (ref 26.0–34.0)
MCHC: 32 g/dL (ref 30.0–36.0)
MCV: 84.8 fL (ref 80.0–100.0)
Platelets: 250 10*3/uL (ref 150–400)
RBC: 3.28 MIL/uL — ABNORMAL LOW (ref 4.22–5.81)
RDW: 17.2 % — ABNORMAL HIGH (ref 11.5–15.5)
WBC: 21.6 10*3/uL — ABNORMAL HIGH (ref 4.0–10.5)
nRBC: 0 % (ref 0.0–0.2)

## 2020-09-28 LAB — GLUCOSE, CAPILLARY
Glucose-Capillary: 117 mg/dL — ABNORMAL HIGH (ref 70–99)
Glucose-Capillary: 107 mg/dL — ABNORMAL HIGH (ref 70–99)
Glucose-Capillary: 108 mg/dL — ABNORMAL HIGH (ref 70–99)
Glucose-Capillary: 120 mg/dL — ABNORMAL HIGH (ref 70–99)
Glucose-Capillary: 164 mg/dL — ABNORMAL HIGH (ref 70–99)
Glucose-Capillary: 188 mg/dL — ABNORMAL HIGH (ref 70–99)

## 2020-09-28 LAB — BASIC METABOLIC PANEL
Anion gap: 11 (ref 5–15)
BUN: 33 mg/dL — ABNORMAL HIGH (ref 8–23)
CO2: 27 mmol/L (ref 22–32)
Calcium: 8.2 mg/dL — ABNORMAL LOW (ref 8.9–10.3)
Chloride: 102 mmol/L (ref 98–111)
Creatinine, Ser: 1.83 mg/dL — ABNORMAL HIGH (ref 0.61–1.24)
GFR, Estimated: 38 mL/min — ABNORMAL LOW (ref >60)
Glucose, Bld: 132 mg/dL — ABNORMAL HIGH (ref 70–99)
Potassium: 3.6 mmol/L (ref 3.5–5.1)
Sodium: 140 mmol/L (ref 135–145)

## 2020-09-28 LAB — PROCALCITONIN: Procalcitonin: 37.4 ng/mL

## 2020-09-28 LAB — MAGNESIUM: Magnesium: 2 mg/dL (ref 1.7–2.4)

## 2020-09-28 LAB — PHOSPHORUS: Phosphorus: 2.8 mg/dL (ref 2.5–4.6)

## 2020-09-28 LAB — SODIUM, URINE, RANDOM: Sodium, Ur: <10 mmol/L

## 2020-09-28 LAB — CREATININE, URINE, RANDOM: Creatinine, Urine: 130.97 mg/dL

## 2020-09-28 MED ORDER — VITAL 1.5 CAL PO LIQD
1000.0000 mL | ORAL | Status: DC
  Administered 2020-09-28: 1000 mL

## 2020-09-28 MED ORDER — PROSOURCE TF PO LIQD: 45.0000 mL | Freq: Two times a day (BID) | ORAL | Status: DC

## 2020-09-28 MED ORDER — PROSOURCE TF PO LIQD
45.0000 mL | Freq: Three times a day (TID) | ORAL | Status: DC
  Administered 2020-09-28 – 2020-09-29 (×3): 45 mL
  Filled 2020-09-28 (×3): qty 45

## 2020-09-28 MED ORDER — CHLORHEXIDINE GLUCONATE CLOTH 2 % EX PADS
6.0000 | MEDICATED_PAD | Freq: Every day | CUTANEOUS | Status: DC
  Administered 2020-09-28 – 2020-10-02 (×6): 6 via TOPICAL

## 2020-09-28 MED ORDER — IPRATROPIUM-ALBUTEROL 0.5-2.5 (3) MG/3ML IN SOLN
3.0000 mL | Freq: Four times a day (QID) | RESPIRATORY_TRACT | Status: DC
  Administered 2020-09-28 – 2020-09-29 (×4): 3 mL via RESPIRATORY_TRACT
  Filled 2020-09-28 (×4): qty 3

## 2020-09-28 NOTE — TOC Initial Note (Signed)
Transition of Care [facility]) - Initial/Assessment Note    Patient Details  Name: Jeremiah [name] MRN: [id number] Date of Birth: May 21, 1946  [facility]) CM/SW Contact:    Lockie Pares, RN Phone Number: 09/28/2020, 4:18 PM  Clinical Narrative:                 Readmission just discharged 2 days ago to [facility]. Palliative care has been on patients case, family believes that life , no matter what is better than death, therefore had continued full code status.  Plan EDD 6 days, transfer back to [facility].  TOC will follow patient.   Expected Discharge Plan: Skilled Nursing Facility     Patient Goals and CMS Choice        Expected Discharge Plan and Services Expected Discharge Plan: Skilled Nursing Facility In-house Referral: Clinical Social Work   Post Acute Care Choice: Skilled Nursing Facility Living arrangements for the past 2 months: Skilled Nursing Facility, Single Family Home                                      Prior Living Arrangements/Services Living arrangements for the past 2 months: Skilled Nursing Facility, Single Family Home Lives with:: Facility Resident Patient language and need for interpreter reviewed:: Yes        Need for Family Participation in Patient Care: Yes (Comment) Care giver support system in place?: Yes (comment)   Criminal Activity/Legal Involvement Pertinent to Current Situation/Hospitalization: No - Comment as needed  Activities of Daily Living      Permission Sought/Granted                  Emotional Assessment   Attitude/Demeanor/Rapport: Intubated (Following Commands or Not Following Commands) Affect (typically observed): Unable to Assess Orientation: : Fluctuating Orientation (Suspected and/or reported Sundowners) Alcohol / Substance Use: Not Applicable Psych Involvement: No (comment)  Admission diagnosis:  Acute respiratory failure (HCC) [J96.00] Renal failure [N19] Acute respiratory failure  with hypoxia and hypercapnia (HCC) [J96.01, J96.02] Acute hypoxemic respiratory failure (HCC) [J96.01] Patient Active Problem List   Diagnosis Date Noted  . Acute hypoxemic respiratory failure (HCC) 09/27/2020  . Acute respiratory failure (HCC) 09/27/2020  . Protein-calorie malnutrition, severe 09/21/2020  . Palliative care by specialist   . Goals of care, counseling/discussion   . CAP (community acquired pneumonia) 08/27/2020  . Pneumonia 08/27/2020  . Acute respiratory failure with hypoxia (HCC)   . Chest tube in place   . Pneumonia due to COVID-19 virus 08/09/2020  . Acute metabolic encephalopathy 08/09/2020  . Dysphagia 08/09/2020  . Weakness   . Acute lower UTI 06/25/2020  . Abdominal pain   . Gangrene (HCC) 06/18/2020  . Post-operative pain 04/21/2019  . Abnormality of gait 03/24/2019  . Hypoglycemia   . Hyperkalemia   . Hyponatremia   . Diabetic peripheral neuropathy (HCC)   . Labile blood glucose   . Enteritis due to Clostridium difficile   . Blood glucose abnormal   . Diarrhea   . Benign essential HTN   . Hypoalbuminemia due to protein-calorie malnutrition (HCC)   . Labile blood pressure   . 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 Postoperative pain   . Acute blood loss anemia   . Dyslipidemia   . Type 2 diabetes mellitus with peripheral neuropathy (HCC)   . S/P AKA (above knee amputation) (HCC) 11/12/2018  . Unilateral AKA,  right (HCC)   . Hypertensive crisis   . Diabetes mellitus type 2 in nonobese (HCC)   . Pleural effusion 11/08/2018  . Fever   . Open wound of right foot   . Sepsis (HCC) 11/03/2018  . Altered mental status   . Acute cystitis without hematuria   . Leukocytosis   . Pressure injury of skin 07/08/2018  . Clostridium difficile colitis 07/07/2018  . Hypokalemia 07/07/2018  . HLD (hyperlipidemia) 07/07/2018  . Acute diverticulitis 06/04/2018  . Cholecystitis 05/24/2018  . Cholecystitis with cholelithiasis 05/21/2018  . Acute systolic  heart failure (HCC) 40/08/2724  . Diabetes mellitus without complication (HCC) 01/30/2018  . Hypertension 01/30/2018  . Acute cholecystitis 01/30/2018  . AKI (acute kidney injury) (HCC) 01/30/2018  . Normocytic anemia 01/30/2018  . PVD (peripheral vascular disease) (HCC) 09/05/2014   PCP:  Annita Brod, MD Pharmacy:   [facility] - [place], [place] - [address] [place] [address] Phone: [phone] Fax: [phone]     Social Determinants of Health (SDOH) Interventions    Readmission Risk Interventions Readmission Risk Prevention Plan 08/13/2020 07/08/2018 07/08/2018  Transportation Screening Complete - Complete  Medication Review (RN Care Manager) Referral to Pharmacy - Complete  PCP or Specialist appointment within 3-5 days of discharge Complete - Not Complete  HRI or Home Care Consult Complete (No Data) Complete  SW Recovery Care/Counseling Consult Complete Patient refused Not Complete  Palliative Care Screening Not Applicable - Not Complete  Comments - - (No Data)  Medication Reconcilation (Pharmacy) - - Not Complete  Skilled Nursing Facility Complete - Patient refused  Some recent data might be hidden

## 2020-09-28 NOTE — Consult Note (Signed)
WOC Nurse Consult Note: Patient receiving care in [facility] 2M04.  Assisted by NT for turning. Reason for Consult: unstageable PI to left heel, stage 2 PIs to buttocks Wound type: Unstageable PI to left heel.  ONLY hypopigmented areas to buttocks, areas are healed however. Pressure Injury POA: Yes Measurement: left heel measures 3 cm x 5 cm  Wound bed: 100% black eschar Drainage (amount, consistency, odor) none Periwound: intact Dressing procedure/placement/frequency: Apply Iodine from the swab pads or swab sticks in clean utility, to the left heel wound. Float heel off of mattress. Sacral prophylactic foam dressing in place to buttocks.  Foam dressing in place to left heel. Monitor the wound area(s) for worsening of condition such as: Signs/symptoms of infection,  Increase in size,  Development of or worsening of odor, Development of pain, or increased pain at the affected locations.  Notify the medical team if any of these develop.  Thank you for the consult.  WOC nurse will not follow at this time.  Please re-consult the WOC team if needed.  Helmut Muster, RN, MSN, CWOCN, CNS-BC, pager [phone]

## 2020-09-28 NOTE — Progress Notes (Signed)
NAME:  Jeremiah [name], MRN:  [id number], DOB:  04/01/1946, LOS: 1 ADMISSION DATE:  09/27/2020, CONSULTATION DATE:  09/27/20 REFERRING MD:  Dr Criss Alvine, CHIEF COMPLAINT:  intubated  Brief History   74 yo black male with multiple comorbidities and recent hospitalizations re-presents after d/c 2 days ago with resp failure.  History of present illness   74 yo with pmh chronic /systolic diastolic HF, htn, hyperlipidemia, T2DM, CKD3b, R AKA, chronic loculated pleural effusion (not decortication candidate), pvd, cardiac arrest, repeated hospitalizations who presented today to [facility] via EMS from [facility].    All history is obtained from chart and ED records 2/2 pt's unresponsive and intubated state. Pt was just discharged to this facility on 11/2. He was found this morning unresponsive with sats in the 60's. He was placed on face mask at 10L which improved sats to 88% and pt began to arouse. Unfortunately oxygen requirement escalated to NRB and sats continued to decline and he became unresponsive again and ultimately required intubation.   Upon evaluation pt was found to have leukocytosis to 26, acute on chronic renal failure with baseline Cr 1.1 now 1.5  Past Medical History   Past Medical History:  Diagnosis Date  . Amputee   . Aortic atherosclerosis (HCC)   . Arthritis    "joints; shoulders, knees, hands, back" (05/21/2018)  . Atherosclerosis of coronary artery   . C. difficile diarrhea 04/2018  . Diastolic CHF (HCC)   . Diverticulosis   . High cholesterol   . History of gout   . Hypertension   . IDA (iron deficiency anemia)    from referral Dr Darleene Cleaver  . Internal hemorrhoids   . Peripheral vascular disease (HCC)   . Pneumonia    "couple times" (05/21/2018)  . Sleep apnea    "has mask; won't use" (05/21/2018)  . Type II diabetes mellitus (HCC)     Significant Hospital Events   Intubated 11/4  Consults:  PCCM  Procedures:  ett 11/4  Significant Diagnostic Tests:    10/26 echo: LVEF 40-45, grade II diastolic dysfunction, hypokinesis, moderate pericardial effusion without evidence of tamponade  11/4 cth: Atrophy with small vessel chronic ischemic changes of deep cerebral white matter.  No acute intracranial abnormalities.  Micro Data:  11/4 sars/flu: negative 11/4 blood:  11/4 resp:    Antimicrobials:  Cefepime 11/4>  vanc 11/4 - 11/5 Flagyl 11/4>>  Interim history/subjective:  Alert this morning and Following commands. Has increased oral secretions. Switched to PSV this AM but requiring 16/5.  Objective   Blood pressure 126/63, pulse 80, temperature 99.5 F (37.5 C), temperature source Axillary, resp. rate 12, height 5\' 9"  (1.753 m), weight 59 kg, SpO2 100 %.    Vent Mode: PSV FiO2 (%):  [40 %-60 %] 40 % Set Rate:  [30 bmp] 30 bmp Vt Set:  [420 mL] 420 mL PEEP:  [5 cmH20-10 cmH20] 5 cmH20 Pressure Support:  [16 cmH20] 16 cmH20 Plateau Pressure:  [28 cmH20-34 cmH20] 28 cmH20   Intake/Output Summary (Last 24 hours) at 09/28/2020 1155 Last data filed at 09/28/2020 1153 Gross per 24 hour  Intake 4016.41 ml  Output 600 ml  Net 3416.41 ml   Filed Weights   09/27/20 1131 09/27/20 1742 09/28/20 0500  Weight: 56 kg 60.3 kg 59 kg    Examination: General: no acute distress, alert, intubated HEENT: NCAT, PERRL, MMMP Lungs: course breath sounds bilaterally Cardiovascular: RRR, no m/g/r Abdomen: soft, NT,ND, BS+ Extremities: trace edema, R AKA  Skin: no rashes, warm and dry Neuro: purposeful movement in b/l ue and spont in LLE  Resolved Hospital Problem list     Assessment & Plan:  Acute hypoxic resp failure:  - Acute on chronic hypercapnic respiratory failure in setting of underlying OSA intolerant to CPAP and increased oral secretions. Other etiologies include pneumonia vs aspiration. - Check procalcitonin - Stop flagyl and vancomycin. Continue cefepime for now - Wean PSV as able today. Place on PRVC overnight if remains  intubated. - Scheduled duoneb treatments. PRN albuterol nebs - PRN fentanyl pushes for analgesia/sedation. Can add precedex if needed.  Sepsis likely 2/2 pneumonia, hcap gp/gn suspected - Continue cefepime - follow procalcitonin and cultures  H/o hypertension:  -holding home meds  Acute encephalopathy 2/2 hypoxia:  - Improved - continue vent support as above - minimize sedating agents  Chronic normocytic anemia: -no acute indication for transfusion -no overt bleeding noted -transfuse <7  Acute on Chronic Kidney Disease, Stage 3B: - Cr continues to climb - Monitor UOP - Possible ATN in setting of sepsis and respiratory failure - Check urine lytes  Chronic diastolic/systolic hf: -without acute exacerbation -monitor fluid status - Limited echo 11/4 shows persistent pericardial effusion with no features of tamponade.   T2dm:  -glucose monitoring  Goals of care:  -certainly need ongoing goals of care discussion with family in light of repeated recent hospitalizations and now multiple intubations. Per review of chart palliative has been previously involved but not well received by family who has been clear with their wishes to prolong life "they feel that living is better than not irregardless of the quality of that life" (per palliative consult 09/21/20).  Best practice:  Diet: tf Pain/Anxiety/Delirium protocol (if indicated): per protocol VAP protocol (if indicated): Per protocol DVT prophylaxis:  GI prophylaxis: ppi Glucose control: monitoring Mobility: bedrest Code Status: FULL Family Communication: pending Disposition: ICU  Labs   CBC: Recent Labs  Lab 09/23/20 0325 09/23/20 0325 09/24/20 0142 09/24/20 0142 09/25/20 0143 09/27/20 1107 09/27/20 1146 09/27/20 1326 09/28/20 0254  WBC 11.9*  --  11.6*  --  12.3* 26.1*  --   --  21.6*  NEUTROABS 8.2*  --  7.6  --  8.6* 23.5*  --   --   --   HGB 9.2*   < > 9.5*   < > 9.4* 11.4* 8.8* 10.5* 8.9*  HCT 29.2*   <  > 29.9*   < > 29.7* 38.0* 26.0* 31.0* 27.8*  MCV 85.9  --  85.9  --  86.8 92.2  --   --  84.8  PLT 224  --  236  --  226 389  --   --  250   < > = values in this interval not displayed.    Basic Metabolic Panel: Recent Labs  Lab 09/22/20 0656 09/22/20 0656 09/23/20 0325 09/23/20 0325 09/24/20 0142 09/27/20 1107 09/27/20 1146 09/27/20 1326 09/28/20 0254  NA 143   < > 141   < > 140 141 144 140 140  K 3.5   < > 3.4*   < > 3.8 4.5 3.9 4.1 3.6  CL 100  --  98  --  99 97*  --   --  102  CO2 29  --  33*  --  32 35*  --   --  27  GLUCOSE 129*  --  171*  --  55* 216*  --   --  132*  BUN 25*  --  21  --  20 27*  --   --  33*  CREATININE 1.19  --  1.18  --  1.15 1.59*  --   --  1.83*  CALCIUM 8.6*  --  8.5*  --  8.5* 9.1  --   --  8.2*  MG  --   --  1.4*  --   --   --   --   --   --    < > = values in this interval not displayed.   GFR: Estimated Creatinine Clearance: 28.7 mL/min (A) (by C-G formula based on SCr of 1.83 mg/dL (H)). Recent Labs  Lab 09/24/20 0142 09/25/20 0143 09/27/20 1107 09/27/20 1114 09/27/20 1324 09/28/20 0254  WBC 11.6* 12.3* 26.1*  --   --  21.6*  LATICACIDVEN  --   --   --  1.2 2.2*  --     Liver Function Tests: Recent Labs  Lab 09/23/20 0325 09/24/20 0142 09/27/20 1107  AST 14* 15 17  ALT 41 33 26  ALKPHOS 152* 121 137*  BILITOT 0.5 0.7 0.2*  PROT 6.6 6.6 8.2*  ALBUMIN 1.8* 1.7* 2.2*   No results for input(s): LIPASE, AMYLASE in the last 168 hours. No results for input(s): AMMONIA in the last 168 hours.  ABG    Component Value Date/Time   PHART 7.362 09/27/2020 1326   PCO2ART 58.8 (H) 09/27/2020 1326   PO2ART 66 (L) 09/27/2020 1326   HCO3 33.4 (H) 09/27/2020 1326   TCO2 35 (H) 09/27/2020 1326   O2SAT 91.0 09/27/2020 1326     Coagulation Profile: No results for input(s): INR, PROTIME in the last 168 hours.  Cardiac Enzymes: No results for input(s): CKTOTAL, CKMB, CKMBINDEX, TROPONINI in the last 168 hours.  HbA1C: Hgb A1c MFr  Bld  Date/Time Value Ref Range Status  08/10/2020 12:33 AM 6.0 (H) 4.8 - 5.6 % Final    Comment:    (NOTE) Pre diabetes:          5.7%-6.4%  Diabetes:              >6.4%  Glycemic control for   <7.0% adults with diabetes   06/18/2020 03:59 PM 6.1 (H) 4.8 - 5.6 % Final    Comment:    (NOTE) Pre diabetes:          5.7%-6.4%  Diabetes:              >6.4%  Glycemic control for   <7.0% adults with diabetes     CBG: Recent Labs  Lab 09/27/20 1914 09/27/20 2334 09/28/20 0310 09/28/20 0742 09/28/20 1116  GLUCAP 132* 126* 117* 107* 108*    Critical care time: The patient is critically ill with multiple organ systems failure and requires high complexity decision making for assessment and support, frequent evaluation and titration of therapies, application of advanced monitoring technologies and extensive interpretation of multiple databases.  Critical care time 40 mins. This represents my time independent of the NP's/PA's/med students/residents time taking care of the pt. This is excluding procedures.    Melody Comas, MD [facility] Office: [phone]   See Amion for Pager Details

## 2020-09-28 NOTE — Plan of Care (Signed)

## 2020-09-28 NOTE — Progress Notes (Signed)
Initial Nutrition Assessment  DOCUMENTATION CODES:   Severe malnutrition in context of chronic illness  INTERVENTION:   Initiate tube feeds via OG tube: - Vital 1.5 @ 45 ml/hr (1080 ml/day) - ProSource TF 45 ml TID  Tube feeding regimen provides 1740 kcal, 106 grams of protein, and 825 ml of H2O.   NUTRITION DIAGNOSIS:   Severe Malnutrition related to chronic illness (CHF) as evidenced by severe fat depletion, severe muscle depletion, percent weight loss (25.7% weight loss in less than 6 months).  GOAL:   Patient will meet greater than or equal to 90% of their needs  MONITOR:   Vent status, Labs, Weight trends, TF tolerance, Skin, I & O's  REASON FOR ASSESSMENT:   Ventilator, Consult Enteral/tube feeding initiation and management  ASSESSMENT:   74 year old male who presented to the ED on 11/04 in respiratory distress. PMH of CHF, HTN, HLD, T2DM, CKD stage III, right AKA, chronic loculated pleural effusions, PVD, cardiac arrest. Pt intubated in the ED. Pt admitted with sepsis likely secondary to pneumonia.   Discussed pt with RN and during ICU rounds. Pt weaning today. Spoke with CCM MD. Molli Knock to start tube feeds today per MD. Verbal with readback consult placed.  Unable to obtain diet or weight history from pt at this time. Reviewed weight history in chart. Pt with a 20.4 kg weight loss since 04/20/20. This is a 25.7% weight loss in less than 6 months which is severe and significant for timeframe. Pt meets criteria for severe malnutrition.  OG tube in place with tip below GE junction per x-ray.  Patient is currently intubated on ventilator support MV: 7.6 L/min Temp (24hrs), Avg:100.1 F (37.8 C), Min:99.2 F (37.3 C), Max:102.1 F (38.9 C) BP (cuff): 111/55 MAP (cuff): 72  Medications reviewed and include: SSI q 4 hours, protonix, IV abx  Labs reviewed: BUN 33, creatinine 1.83, hemoglobin 8.9 CBG's: 107-178 x 24 hours  I/O's: +3.5 L since admit  NUTRITION -  FOCUSED PHYSICAL EXAM:    Most Recent Value  Orbital Region Moderate depletion  Upper Arm Region Severe depletion  Thoracic and Lumbar Region Severe depletion  Buccal Region Moderate depletion  Temple Region Moderate depletion  Clavicle Bone Region Severe depletion  Clavicle and Acromion Bone Region Severe depletion  Scapular Bone Region Unable to assess  Dorsal Hand Mild depletion  Patellar Region Moderate depletion  Anterior Thigh Region Severe depletion  Posterior Calf Region Severe depletion  Edema (RD Assessment) None  Hair Reviewed  Eyes Reviewed  Mouth Reviewed  Skin Reviewed  Nails Reviewed       Diet Order:   Diet Order            Diet NPO time specified  Diet effective now                 EDUCATION NEEDS:   No education needs have been identified at this time  Skin:  Skin Assessment: Skin Integrity Issues: Stage II: left buttocks Unstageable: left heel  Last BM:  no documented BM  Height:   Ht Readings from Last 1 Encounters:  09/27/20 5\' 9"  (1.753 m)    Weight:   Wt Readings from Last 1 Encounters:  09/28/20 59 kg    Ideal Body Weight:  66.9 kg  BMI:  Body mass index is 19.21 kg/m.  Estimated Nutritional Needs:   Kcal:  1783  Protein:  90-105 grams  Fluid:  >/= 1.8 L    13/05/21, MS, RD, LDN  Inpatient Clinical Dietitian Please see AMiON for contact information.

## 2020-09-29 DIAGNOSIS — J9601 Acute respiratory failure with hypoxia: Secondary | ICD-10-CM | POA: Diagnosis not present

## 2020-09-29 LAB — CBC
HCT: 26.1 % — ABNORMAL LOW (ref 39.0–52.0)
Hemoglobin: 8.3 g/dL — ABNORMAL LOW (ref 13.0–17.0)
MCH: 27.4 pg (ref 26.0–34.0)
MCHC: 31.8 g/dL (ref 30.0–36.0)
MCV: 86.1 fL (ref 80.0–100.0)
Platelets: 210 10*3/uL (ref 150–400)
RBC: 3.03 MIL/uL — ABNORMAL LOW (ref 4.22–5.81)
RDW: 18.4 % — ABNORMAL HIGH (ref 11.5–15.5)
WBC: 12.4 10*3/uL — ABNORMAL HIGH (ref 4.0–10.5)
nRBC: 0 % (ref 0.0–0.2)

## 2020-09-29 LAB — BASIC METABOLIC PANEL
Anion gap: 12 (ref 5–15)
BUN: 44 mg/dL — ABNORMAL HIGH (ref 8–23)
CO2: 24 mmol/L (ref 22–32)
Calcium: 8.1 mg/dL — ABNORMAL LOW (ref 8.9–10.3)
Chloride: 104 mmol/L (ref 98–111)
Creatinine, Ser: 1.74 mg/dL — ABNORMAL HIGH (ref 0.61–1.24)
GFR, Estimated: 40 mL/min — ABNORMAL LOW (ref >60)
Glucose, Bld: 214 mg/dL — ABNORMAL HIGH (ref 70–99)
Potassium: 3.4 mmol/L — ABNORMAL LOW (ref 3.5–5.1)
Sodium: 140 mmol/L (ref 135–145)

## 2020-09-29 LAB — GLUCOSE, CAPILLARY
Glucose-Capillary: 185 mg/dL — ABNORMAL HIGH (ref 70–99)
Glucose-Capillary: 214 mg/dL — ABNORMAL HIGH (ref 70–99)
Glucose-Capillary: 198 mg/dL — ABNORMAL HIGH (ref 70–99)
Glucose-Capillary: 109 mg/dL — ABNORMAL HIGH (ref 70–99)
Glucose-Capillary: 128 mg/dL — ABNORMAL HIGH (ref 70–99)
Glucose-Capillary: 82 mg/dL (ref 70–99)

## 2020-09-29 LAB — PHOSPHORUS
Phosphorus: 1.5 mg/dL — ABNORMAL LOW (ref 2.5–4.6)
Phosphorus: 5.1 mg/dL — ABNORMAL HIGH (ref 2.5–4.6)

## 2020-09-29 LAB — MAGNESIUM
Magnesium: 1.9 mg/dL (ref 1.7–2.4)
Magnesium: 1.8 mg/dL (ref 1.7–2.4)

## 2020-09-29 LAB — PROCALCITONIN: Procalcitonin: 33.17 ng/mL

## 2020-09-29 MED ORDER — ASPIRIN 81 MG PO CHEW
81.0000 mg | CHEWABLE_TABLET | Freq: Every day | ORAL | Status: DC
  Administered 2020-09-30 – 2020-10-04 (×5): 81 mg via ORAL
  Filled 2020-09-29 (×5): qty 1

## 2020-09-29 MED ORDER — DOCUSATE SODIUM 50 MG/5ML PO LIQD
100.0000 mg | Freq: Two times a day (BID) | ORAL | Status: DC
  Administered 2020-09-29: 100 mg
  Filled 2020-09-29: qty 10

## 2020-09-29 MED ORDER — PANTOPRAZOLE SODIUM 40 MG PO TBEC
40.0000 mg | DELAYED_RELEASE_TABLET | Freq: Every day | ORAL | Status: DC
  Administered 2020-09-30: 40 mg via ORAL
  Filled 2020-09-29: qty 1

## 2020-09-29 MED ORDER — POTASSIUM PHOSPHATES 15 MMOLE/5ML IV SOLN
30.0000 mmol | Freq: Once | INTRAVENOUS | Status: AC
  Administered 2020-09-29: 30 mmol via INTRAVENOUS
  Filled 2020-09-29: qty 10

## 2020-09-29 MED ORDER — CLOPIDOGREL BISULFATE 75 MG PO TABS
75.0000 mg | ORAL_TABLET | Freq: Every day | ORAL | Status: DC
  Administered 2020-09-30 – 2020-10-04 (×5): 75 mg via ORAL
  Filled 2020-09-29 (×5): qty 1

## 2020-09-29 MED ORDER — DOCUSATE SODIUM 100 MG PO CAPS
100.0000 mg | ORAL_CAPSULE | Freq: Two times a day (BID) | ORAL | Status: DC
  Administered 2020-09-29 – 2020-09-30 (×2): 100 mg via ORAL
  Filled 2020-09-29 (×2): qty 1

## 2020-09-29 MED ORDER — SENNA 8.6 MG PO TABS
1.0000 | ORAL_TABLET | Freq: Two times a day (BID) | ORAL | Status: DC
  Administered 2020-09-29 – 2020-09-30 (×2): 8.6 mg via ORAL
  Filled 2020-09-29 (×2): qty 1

## 2020-09-29 MED ORDER — IPRATROPIUM-ALBUTEROL 0.5-2.5 (3) MG/3ML IN SOLN
3.0000 mL | Freq: Two times a day (BID) | RESPIRATORY_TRACT | Status: DC
  Administered 2020-09-29 – 2020-09-30 (×3): 3 mL via RESPIRATORY_TRACT
  Filled 2020-09-29 (×2): qty 3

## 2020-09-29 MED ORDER — INSULIN ASPART 100 UNIT/ML ~~LOC~~ SOLN
0.0000 [IU] | SUBCUTANEOUS | Status: DC
  Administered 2020-09-29: 2 [IU] via SUBCUTANEOUS
  Administered 2020-09-29: 3 [IU] via SUBCUTANEOUS
  Administered 2020-09-30: 8 [IU] via SUBCUTANEOUS
  Administered 2020-09-30: 2 [IU] via SUBCUTANEOUS
  Administered 2020-09-30: 5 [IU] via SUBCUTANEOUS
  Administered 2020-09-30: 3 [IU] via SUBCUTANEOUS
  Administered 2020-09-30: 2 [IU] via SUBCUTANEOUS
  Administered 2020-10-01: 5 [IU] via SUBCUTANEOUS
  Administered 2020-10-01: 2 [IU] via SUBCUTANEOUS
  Administered 2020-10-01: 3 [IU] via SUBCUTANEOUS
  Administered 2020-10-01 – 2020-10-02 (×3): 2 [IU] via SUBCUTANEOUS
  Administered 2020-10-02: 5 [IU] via SUBCUTANEOUS
  Administered 2020-10-02 – 2020-10-03 (×2): 2 [IU] via SUBCUTANEOUS

## 2020-09-29 MED ORDER — SENNOSIDES 8.8 MG/5ML PO SYRP
5.0000 mL | ORAL_SOLUTION | Freq: Two times a day (BID) | ORAL | Status: DC
  Administered 2020-09-29: 5 mL
  Filled 2020-09-29: qty 5

## 2020-09-29 MED ORDER — ORAL CARE MOUTH RINSE
15.0000 mL | Freq: Two times a day (BID) | OROMUCOSAL | Status: DC
  Administered 2020-09-29 – 2020-10-04 (×9): 15 mL via OROMUCOSAL

## 2020-09-29 NOTE — Progress Notes (Signed)
NAME:  Jeremiah [name], MRN:  [id number], DOB:  [birth year], LOS: 2 ADMISSION DATE:  09/27/2020, CONSULTATION DATE:  09/27/20 REFERRING MD:  Dr Criss Alvine, CHIEF COMPLAINT:  intubated  Brief History   74 yo black male with multiple comorbidities and recent hospitalizations re-presents after d/c 2 days ago with resp failure.  History of present illness   74 yo with pmh chronic /systolic diastolic HF, htn, hyperlipidemia, T2DM, CKD3b, R AKA, chronic loculated pleural effusion (not decortication candidate), pvd, cardiac arrest, repeated hospitalizations who presented today to [facility] via EMS from [facility].    All history is obtained from chart and ED records 2/2 pt's unresponsive and intubated state. Pt was just discharged to this facility on 11/2. He was found this morning unresponsive with sats in the 60's. He was placed on face mask at 10L which improved sats to 88% and pt began to arouse. Unfortunately oxygen requirement escalated to NRB and sats continued to decline and he became unresponsive again and ultimately required intubation.   Upon evaluation pt was found to have leukocytosis to 26, acute on chronic renal failure with baseline Cr 1.1 now 1.5  Past Medical History   Past Medical History:  Diagnosis Date  . Amputee   . Aortic atherosclerosis (HCC)   . Arthritis    "joints; shoulders, knees, hands, back" (05/21/2018)  . Atherosclerosis of coronary artery   . C. difficile diarrhea 04/2018  . Diastolic CHF (HCC)   . Diverticulosis   . High cholesterol   . History of gout   . Hypertension   . IDA (iron deficiency anemia)    from referral Dr Darleene Cleaver  . Internal hemorrhoids   . Peripheral vascular disease (HCC)   . Pneumonia    "couple times" (05/21/2018)  . Sleep apnea    "has mask; won't use" (05/21/2018)  . Type II diabetes mellitus (HCC)     Significant Hospital Events   Intubated 11/4  Consults:  PCCM  Procedures:  ett 11/4   Significant Diagnostic Tests:    10/26 echo: LVEF 40-45, grade II diastolic dysfunction, hypokinesis, moderate pericardial effusion without evidence of tamponade  11/4 cth: Atrophy with small vessel chronic ischemic changes of deep cerebral white matter.  No acute intracranial abnormalities.  Micro Data:  11/4 sars/flu: negative 11/4 blood:  11/4 resp:    Antimicrobials:  Cefepime 11/4>  vanc 11/4 - 11/5 Flagyl 11/4>11/5  Interim history/subjective:  Alert this morning and Following commands.Tolerated PSV this morning and was extubated successfully to nasal canula.   Objective   Blood pressure (!) 147/63, pulse 79, temperature 98.2 F (36.8 C), resp. rate (!) 23, height 5\' 9"  (1.753 m), weight 61.2 kg, SpO2 100 %.    Vent Mode: PRVC FiO2 (%):  [40 %] 40 % Set Rate:  [30 bmp] 30 bmp Vt Set:  [420 mL] 420 mL PEEP:  [5 cmH20] 5 cmH20 Plateau Pressure:  [21 cmH20-25 cmH20] 21 cmH20   Intake/Output Summary (Last 24 hours) at 09/29/2020 1605 Last data filed at 09/29/2020 1300 Gross per 24 hour  Intake 1152.64 ml  Output 1150 ml  Net 2.64 ml   Filed Weights   09/28/20 0500 09/28/20 1933 09/29/20 0454  Weight: 59 kg 61.2 kg 61.2 kg    Examination: General: no acute distress, alert HEENT: NCAT, PERRL, MMMP Lungs: clear to auscultation bilatearlly Cardiovascular: RRR, no m/g/r Abdomen: soft, NT,ND, BS+ Extremities: trace edema, R AKA Skin: no rashes, warm and dry Neuro: purposeful movement in b/l  ue and spont in LLE  Resolved Hospital Problem list     Assessment & Plan:  Acute hypoxic resp failure:  - Acute on chronic hypercapnic respiratory failure in setting of underlying OSA intolerant to CPAP and increased oral secretions. Other etiologies include pneumonia vs aspiration. - Procalcitonin elevated - Continue cefepime  - Patient extubated this afterrnoon and doing well - CPAP overnight as he has history of OSA  Sepsis likely 2/2 pneumonia, hcap gp/gn suspected - Continue cefepime - follow  procalcitonin and cultures  H/o hypertension:  -holding home meds  Acute encephalopathy 2/2 hypoxia:  - Improved - minimize sedating agents  Chronic normocytic anemia: -no acute indication for transfusion -no overt bleeding noted -transfuse <7  Acute on Chronic Kidney Disease, Stage 3B: - Cr continues to climb - Monitor UOP - Possible ATN in setting of sepsis and respiratory failure  Chronic diastolic/systolic hf: -without acute exacerbation -monitor fluid status - Limited echo 11/4 shows persistent pericardial effusion with no features of tamponade.   T2dm:  -glucose monitoring   Best practice:  Diet: NPO Pain/Anxiety/Delirium protocol (if indicated): VAP protocol (if indicated): DVT prophylaxis:  GI prophylaxis: ppi Glucose control: monitoring Mobility: bedrest Code Status: FULL Family Communication: updated son at bedside 11/6 Disposition: ICU  Labs   CBC: Recent Labs  Lab 09/23/20 0325 09/23/20 0325 09/24/20 0142 09/24/20 0142 09/25/20 0143 09/25/20 0143 09/27/20 1107 09/27/20 1146 09/27/20 1326 09/28/20 0254 09/29/20 0020  WBC 11.9*   < > 11.6*  --  12.3*  --  26.1*  --   --  21.6* 12.4*  NEUTROABS 8.2*  --  7.6  --  8.6*  --  23.5*  --   --   --   --   HGB 9.2*   < > 9.5*   < > 9.4*   < > 11.4* 8.8* 10.5* 8.9* 8.3*  HCT 29.2*   < > 29.9*   < > 29.7*   < > 38.0* 26.0* 31.0* 27.8* 26.1*  MCV 85.9   < > 85.9  --  86.8  --  92.2  --   --  84.8 86.1  PLT 224   < > 236  --  226  --  389  --   --  250 210   < > = values in this interval not displayed.    Basic Metabolic Panel: Recent Labs  Lab 09/23/20 0325 09/23/20 0325 09/24/20 0142 09/24/20 0142 09/27/20 1107 09/27/20 1146 09/27/20 1326 09/28/20 0254 09/28/20 1655 09/29/20 0020  NA 141   < > 140   < > 141 144 140 140  --  140  K 3.4*   < > 3.8   < > 4.5 3.9 4.1 3.6  --  3.4*  CL 98  --  99  --  97*  --   --  102  --  104  CO2 33*  --  32  --  35*  --   --  27  --  24  GLUCOSE 171*  --   55*  --  216*  --   --  132*  --  214*  BUN 21  --  20  --  27*  --   --  33*  --  44*  CREATININE 1.18  --  1.15  --  1.59*  --   --  1.83*  --  1.74*  CALCIUM 8.5*  --  8.5*  --  9.1  --   --  8.2*  --  8.1*  MG 1.4*  --   --   --   --   --   --   --  2.0 1.9  PHOS  --   --   --   --   --   --   --   --  2.8 1.5*   < > = values in this interval not displayed.   GFR: Estimated Creatinine Clearance: 31.3 mL/min (A) (by C-G formula based on SCr of 1.74 mg/dL (H)). Recent Labs  Lab 09/25/20 0143 09/27/20 1107 09/27/20 1114 09/27/20 1324 09/28/20 0254 09/28/20 1212 09/29/20 0020  PROCALCITON  --   --   --   --   --  37.40 33.17  WBC 12.3* 26.1*  --   --  21.6*  --  12.4*  LATICACIDVEN  --   --  1.2 2.2*  --   --   --     Liver Function Tests: Recent Labs  Lab 09/23/20 0325 09/24/20 0142 09/27/20 1107  AST 14* 15 17  ALT 41 33 26  ALKPHOS 152* 121 137*  BILITOT 0.5 0.7 0.2*  PROT 6.6 6.6 8.2*  ALBUMIN 1.8* 1.7* 2.2*   No results for input(s): LIPASE, AMYLASE in the last 168 hours. No results for input(s): AMMONIA in the last 168 hours.  ABG    Component Value Date/Time   PHART 7.362 09/27/2020 1326   PCO2ART 58.8 (H) 09/27/2020 1326   PO2ART 66 (L) 09/27/2020 1326   HCO3 33.4 (H) 09/27/2020 1326   TCO2 35 (H) 09/27/2020 1326   O2SAT 91.0 09/27/2020 1326     Coagulation Profile: No results for input(s): INR, PROTIME in the last 168 hours.  Cardiac Enzymes: No results for input(s): CKTOTAL, CKMB, CKMBINDEX, TROPONINI in the last 168 hours.  HbA1C: Hgb A1c MFr Bld  Date/Time Value Ref Range Status  08/10/2020 12:33 AM 6.0 (H) 4.8 - 5.6 % Final    Comment:    (NOTE) Pre diabetes:          5.7%-6.4%  Diabetes:              >6.4%  Glycemic control for   <7.0% adults with diabetes   06/18/2020 03:59 PM 6.1 (H) 4.8 - 5.6 % Final    Comment:    (NOTE) Pre diabetes:          5.7%-6.4%  Diabetes:              >6.4%  Glycemic control for   <7.0% adults  with diabetes     CBG: Recent Labs  Lab 09/28/20 1937 09/28/20 2329 09/29/20 0349 09/29/20 0745 09/29/20 1202  GLUCAP 164* 188* 185* 214* 198*    Critical care time: The patient is critically ill with multiple organ systems failure and requires high complexity decision making for assessment and support, frequent evaluation and titration of therapies, application of advanced monitoring technologies and extensive interpretation of multiple databases.  Critical care time 40 mins. This represents my time independent of the NP's/PA's/med students/residents time taking care of the pt. This is excluding procedures.    Melody Comas, MD [facility] Office: [phone]   See Amion for Pager Details

## 2020-09-29 NOTE — Procedures (Signed)
Extubation Procedure Note  Patient Details:   Name: Jeremiah [name] DOB: Aug 07, 1946 MRN: [id number]   Airway Documentation:    Vent end date: 09/29/20 Vent end time: 1205   Evaluation  O2 sats: stable throughout Complications: No apparent complications Patient did tolerate procedure well. Bilateral Breath Sounds: Clear, Diminished   Yes   Pt extubated per physician order. Pt suctioned via ETT/orally prior. Pt with positive cuff leak. Upon extubation pt able to speak name, give a good cough and no stridor was heard at this time. Pt placed on 4L nasal cannula with humidity. RT will continue to monitor.   Derinda Late 09/29/2020, 12:10 PM

## 2020-09-29 NOTE — Progress Notes (Signed)
Patient refused BIPAP/CPAP for tonight. Patient stated he would try tomorrow night.

## 2020-09-30 DIAGNOSIS — J9601 Acute respiratory failure with hypoxia: Secondary | ICD-10-CM | POA: Diagnosis not present

## 2020-09-30 LAB — CBC
HCT: 29.5 % — ABNORMAL LOW (ref 39.0–52.0)
Hemoglobin: 9.4 g/dL — ABNORMAL LOW (ref 13.0–17.0)
MCH: 27.7 pg (ref 26.0–34.0)
MCHC: 31.9 g/dL (ref 30.0–36.0)
MCV: 87 fL (ref 80.0–100.0)
Platelets: 254 10*3/uL (ref 150–400)
RBC: 3.39 MIL/uL — ABNORMAL LOW (ref 4.22–5.81)
RDW: 18.3 % — ABNORMAL HIGH (ref 11.5–15.5)
WBC: 13.3 10*3/uL — ABNORMAL HIGH (ref 4.0–10.5)
nRBC: 0 % (ref 0.0–0.2)

## 2020-09-30 LAB — GLUCOSE, CAPILLARY
Glucose-Capillary: 125 mg/dL — ABNORMAL HIGH (ref 70–99)
Glucose-Capillary: 126 mg/dL — ABNORMAL HIGH (ref 70–99)
Glucose-Capillary: 141 mg/dL — ABNORMAL HIGH (ref 70–99)
Glucose-Capillary: 194 mg/dL — ABNORMAL HIGH (ref 70–99)
Glucose-Capillary: 224 mg/dL — ABNORMAL HIGH (ref 70–99)
Glucose-Capillary: 262 mg/dL — ABNORMAL HIGH (ref 70–99)

## 2020-09-30 LAB — BASIC METABOLIC PANEL
Anion gap: 14 (ref 5–15)
BUN: 31 mg/dL — ABNORMAL HIGH (ref 8–23)
CO2: 27 mmol/L (ref 22–32)
Calcium: 8.5 mg/dL — ABNORMAL LOW (ref 8.9–10.3)
Chloride: 103 mmol/L (ref 98–111)
Creatinine, Ser: 1.2 mg/dL (ref 0.61–1.24)
GFR, Estimated: >60 mL/min (ref >60)
Glucose, Bld: 133 mg/dL — ABNORMAL HIGH (ref 70–99)
Potassium: 3.1 mmol/L — ABNORMAL LOW (ref 3.5–5.1)
Sodium: 144 mmol/L (ref 135–145)

## 2020-09-30 LAB — PHOSPHORUS: Phosphorus: 3.1 mg/dL (ref 2.5–4.6)

## 2020-09-30 LAB — MAGNESIUM: Magnesium: 1.7 mg/dL (ref 1.7–2.4)

## 2020-09-30 MED ORDER — POTASSIUM CHLORIDE 10 MEQ/100ML IV SOLN
10.0000 meq | INTRAVENOUS | Status: AC
  Administered 2020-09-30 (×4): 10 meq via INTRAVENOUS
  Filled 2020-09-30 (×4): qty 100

## 2020-09-30 MED ORDER — SODIUM CHLORIDE 0.9 % IV SOLN
INTRAVENOUS | Status: DC | PRN
  Administered 2020-09-30: 250 mL via INTRAVENOUS

## 2020-09-30 MED ORDER — POTASSIUM CHLORIDE CRYS ER 20 MEQ PO TBCR
20.0000 meq | EXTENDED_RELEASE_TABLET | ORAL | Status: AC
  Administered 2020-09-30 (×2): 20 meq via ORAL
  Filled 2020-09-30 (×2): qty 1

## 2020-09-30 NOTE — Progress Notes (Signed)
Pharmacy Electrolyte Replacement  Recent Labs:  Recent Labs    09/30/20 0548  K 3.1*  MG 1.7  PHOS 3.1  CREATININE 1.20    Low Critical Values (K </= 2.5, Phos </= 1, Mg </= 1) Present: None  Plan:  Kcl IV 10 mEq x 4 Kcl 20 mEq PO x 2  Elmer Sow, PharmD, BCPS, BCCCP Clinical Pharmacist [phone]  Please check AMION for all [facility] numbers  09/30/2020 9:16 AM

## 2020-09-30 NOTE — Progress Notes (Signed)
Patient placed on BIPAP tonight for his OSA.

## 2020-09-30 NOTE — Progress Notes (Signed)
NAME:  Jeremiah [name], MRN:  [id number], DOB:  1946/02/19, LOS: 3 ADMISSION DATE:  09/27/2020, CONSULTATION DATE:  09/27/20 REFERRING MD:  Dr Criss Alvine, CHIEF COMPLAINT:  intubated  Brief History   74 yo black male with multiple comorbidities and recent hospitalizations re-presents after d/c 2 days ago with resp failure.  History of present illness   74 yo with pmh chronic /systolic diastolic HF, htn, hyperlipidemia, T2DM, CKD3b, R AKA, chronic loculated pleural effusion (not decortication candidate), pvd, cardiac arrest, repeated hospitalizations who presented today to [facility] via EMS from [facility].    All history is obtained from chart and ED records 2/2 pt's unresponsive and intubated state. Pt was just discharged to this facility on 11/2. He was found this morning unresponsive with sats in the 60's. He was placed on face mask at 10L which improved sats to 88% and pt began to arouse. Unfortunately oxygen requirement escalated to NRB and sats continued to decline and he became unresponsive again and ultimately required intubation.   Upon evaluation pt was found to have leukocytosis to 26, acute on chronic renal failure with baseline Cr 1.1 now 1.5  Past Medical History   Past Medical History:  Diagnosis Date  . Amputee   . Aortic atherosclerosis (HCC)   . Arthritis    "joints; shoulders, knees, hands, back" (05/21/2018)  . Atherosclerosis of coronary artery   . C. difficile diarrhea 04/2018  . Diastolic CHF (HCC)   . Diverticulosis   . High cholesterol   . History of gout   . Hypertension   . IDA (iron deficiency anemia)    from referral Dr Darleene Cleaver  . Internal hemorrhoids   . Peripheral vascular disease (HCC)   . Pneumonia    "couple times" (05/21/2018)  . Sleep apnea    "has mask; won't use" (05/21/2018)  . Type II diabetes mellitus (HCC)     Significant Hospital Events   Intubated 11/4 Extubated 11/6  Consults:  PCCM  Procedures:  ett 11/4   Significant  Diagnostic Tests:  10/26 echo: LVEF 40-45, grade II diastolic dysfunction, hypokinesis, moderate pericardial effusion without evidence of tamponade  11/4 cth: Atrophy with small vessel chronic ischemic changes of deep cerebral white matter.  No acute intracranial abnormalities.  Micro Data:  11/4 sars/flu: negative 11/4 blood: NGTD  Antimicrobials:  Cefepime 11/4>  vanc 11/4 - 11/5 Flagyl 11/4>11/5  Interim history/subjective:  Alert this morning. On room air.   Objective   Blood pressure (!) 145/60, pulse 76, temperature 98.7 F (37.1 C), temperature source Oral, resp. rate 13, height 5\' 9"  (1.753 m), weight 60.1 kg, SpO2 93 %.    FiO2 (%):  [40 %] 40 %   Intake/Output Summary (Last 24 hours) at 09/30/2020 1124 Last data filed at 09/30/2020 0900 Gross per 24 hour  Intake 1192.64 ml  Output 2455 ml  Net -1262.36 ml   Filed Weights   09/28/20 1933 09/29/20 0454 09/30/20 0500  Weight: 61.2 kg 61.2 kg 60.1 kg    Examination: General: no acute distress, alert HEENT: NCAT, PERRL, MMMP Lungs: clear to auscultation bilatearlly Cardiovascular: RRR, no m/g/r Abdomen: soft, NT,ND, BS+ Extremities: trace edema, R AKA Skin: no rashes, warm and dry Neuro: alert, moving all extremities  Resolved Hospital Problem list     Assessment & Plan:  Acute hypoxic resp failure:  - Acute on chronic hypercapnic respiratory failure in setting of underlying OSA intolerant to CPAP and increased oral secretions. Other etiologies include pneumonia vs  aspiration. - Continue cefepime x 5 days - Patient extubated 11/6 - RA during day, CPAP overnight as he has history of OSA  Sepsis likely 2/2 pneumonia, hcap gp/gn suspected - Continue cefepime x 5 days - follow procalcitonin and cultures  H/o hypertension:  -holding home meds  Acute encephalopathy 2/2 hypoxia:  - Improved - minimize sedating agents  Chronic normocytic anemia: -no acute indication for transfusion -no overt bleeding  noted -transfuse <7  Acute on Chronic Kidney Disease, Stage 3B: - Cr downtrending - Monitor UOP - Likely ATN in setting of sepsis and respiratory failure  Chronic diastolic/systolic hf: -without acute exacerbation -monitor fluid status - Limited echo 11/4 shows persistent pericardial effusion with no features of tamponade.   T2dm:  -glucose monitoring   Best practice:  Diet: Mechanical soft, thin liquid Pain/Anxiety/Delirium protocol (if indicated): VAP protocol (if indicated): DVT prophylaxis:  GI prophylaxis: ppi Glucose control: monitoring Mobility: bedrest Code Status: FULL Family Communication: as able Disposition:tx to floor  Labs   CBC: Recent Labs  Lab 09/24/20 0142 09/24/20 0142 09/25/20 0143 09/25/20 0143 09/27/20 1107 09/27/20 1107 09/27/20 1146 09/27/20 1326 09/28/20 0254 09/29/20 0020 09/30/20 0548  WBC 11.6*   < > 12.3*  --  26.1*  --   --   --  21.6* 12.4* 13.3*  NEUTROABS 7.6  --  8.6*  --  23.5*  --   --   --   --   --   --   HGB 9.5*   < > 9.4*   < > 11.4*   < > 8.8* 10.5* 8.9* 8.3* 9.4*  HCT 29.9*   < > 29.7*   < > 38.0*   < > 26.0* 31.0* 27.8* 26.1* 29.5*  MCV 85.9   < > 86.8  --  92.2  --   --   --  84.8 86.1 87.0  PLT 236   < > 226  --  389  --   --   --  250 210 254   < > = values in this interval not displayed.    Basic Metabolic Panel: Recent Labs  Lab 09/24/20 0142 09/24/20 0142 09/27/20 1107 09/27/20 1107 09/27/20 1146 09/27/20 1326 09/28/20 0254 09/28/20 1655 09/29/20 0020 09/29/20 1810 09/30/20 0548  NA 140   < > 141   < > 144 140 140  --  140  --  144  K 3.8   < > 4.5   < > 3.9 4.1 3.6  --  3.4*  --  3.1*  CL 99  --  97*  --   --   --  102  --  104  --  103  CO2 32  --  35*  --   --   --  27  --  24  --  27  GLUCOSE 55*  --  216*  --   --   --  132*  --  214*  --  133*  BUN 20  --  27*  --   --   --  33*  --  44*  --  31*  CREATININE 1.15  --  1.59*  --   --   --  1.83*  --  1.74*  --  1.20  CALCIUM 8.5*  --  9.1   --   --   --  8.2*  --  8.1*  --  8.5*  MG  --   --   --   --   --   --   --  2.0 1.9 1.8 1.7  PHOS  --   --   --   --   --   --   --  2.8 1.5* 5.1* 3.1   < > = values in this interval not displayed.   GFR: Estimated Creatinine Clearance: 44.5 mL/min (by C-G formula based on SCr of 1.2 mg/dL). Recent Labs  Lab 09/27/20 1107 09/27/20 1114 09/27/20 1324 09/28/20 0254 09/28/20 1212 09/29/20 0020 09/30/20 0548  PROCALCITON  --   --   --   --  37.40 33.17  --   WBC 26.1*  --   --  21.6*  --  12.4* 13.3*  LATICACIDVEN  --  1.2 2.2*  --   --   --   --     Liver Function Tests: Recent Labs  Lab 09/24/20 0142 09/27/20 1107  AST 15 17  ALT 33 26  ALKPHOS 121 137*  BILITOT 0.7 0.2*  PROT 6.6 8.2*  ALBUMIN 1.7* 2.2*   No results for input(s): LIPASE, AMYLASE in the last 168 hours. No results for input(s): AMMONIA in the last 168 hours.  ABG    Component Value Date/Time   PHART 7.362 09/27/2020 1326   PCO2ART 58.8 (H) 09/27/2020 1326   PO2ART 66 (L) 09/27/2020 1326   HCO3 33.4 (H) 09/27/2020 1326   TCO2 35 (H) 09/27/2020 1326   O2SAT 91.0 09/27/2020 1326     Coagulation Profile: No results for input(s): INR, PROTIME in the last 168 hours.  Cardiac Enzymes: No results for input(s): CKTOTAL, CKMB, CKMBINDEX, TROPONINI in the last 168 hours.  HbA1C: Hgb A1c MFr Bld  Date/Time Value Ref Range Status  08/10/2020 12:33 AM 6.0 (H) 4.8 - 5.6 % Final    Comment:    (NOTE) Pre diabetes:          5.7%-6.4%  Diabetes:              >6.4%  Glycemic control for   <7.0% adults with diabetes   06/18/2020 03:59 PM 6.1 (H) 4.8 - 5.6 % Final    Comment:    (NOTE) Pre diabetes:          5.7%-6.4%  Diabetes:              >6.4%  Glycemic control for   <7.0% adults with diabetes     CBG: Recent Labs  Lab 09/29/20 1604 09/29/20 1926 09/29/20 2304 09/30/20 0322 09/30/20 0756  GLUCAP 109* 128* 82 126* 141*

## 2020-10-01 DIAGNOSIS — J9601 Acute respiratory failure with hypoxia: Secondary | ICD-10-CM | POA: Diagnosis not present

## 2020-10-01 LAB — CBC
HCT: 30.3 % — ABNORMAL LOW (ref 39.0–52.0)
Hemoglobin: 9.5 g/dL — ABNORMAL LOW (ref 13.0–17.0)
MCH: 26.8 pg (ref 26.0–34.0)
MCHC: 31.4 g/dL (ref 30.0–36.0)
MCV: 85.6 fL (ref 80.0–100.0)
Platelets: 247 10*3/uL (ref 150–400)
RBC: 3.54 MIL/uL — ABNORMAL LOW (ref 4.22–5.81)
RDW: 18 % — ABNORMAL HIGH (ref 11.5–15.5)
WBC: 13 10*3/uL — ABNORMAL HIGH (ref 4.0–10.5)
nRBC: 0 % (ref 0.0–0.2)

## 2020-10-01 LAB — GLUCOSE, CAPILLARY
Glucose-Capillary: 143 mg/dL — ABNORMAL HIGH (ref 70–99)
Glucose-Capillary: 110 mg/dL — ABNORMAL HIGH (ref 70–99)
Glucose-Capillary: 169 mg/dL — ABNORMAL HIGH (ref 70–99)
Glucose-Capillary: 244 mg/dL — ABNORMAL HIGH (ref 70–99)
Glucose-Capillary: 143 mg/dL — ABNORMAL HIGH (ref 70–99)
Glucose-Capillary: 59 mg/dL — ABNORMAL LOW (ref 70–99)

## 2020-10-01 LAB — BASIC METABOLIC PANEL
Anion gap: 11 (ref 5–15)
BUN: 26 mg/dL — ABNORMAL HIGH (ref 8–23)
CO2: 27 mmol/L (ref 22–32)
Calcium: 8.6 mg/dL — ABNORMAL LOW (ref 8.9–10.3)
Chloride: 105 mmol/L (ref 98–111)
Creatinine, Ser: 1.13 mg/dL (ref 0.61–1.24)
GFR, Estimated: >60 mL/min (ref >60)
Glucose, Bld: 172 mg/dL — ABNORMAL HIGH (ref 70–99)
Potassium: 3.7 mmol/L (ref 3.5–5.1)
Sodium: 143 mmol/L (ref 135–145)

## 2020-10-01 MED ORDER — ENSURE ENLIVE PO LIQD
237.0000 mL | Freq: Three times a day (TID) | ORAL | Status: DC
  Administered 2020-10-01 – 2020-10-04 (×8): 237 mL via ORAL

## 2020-10-01 MED ORDER — DEXTROSE 50 % IV SOLN
INTRAVENOUS | Status: AC
  Administered 2020-10-01: 50 mL
  Filled 2020-10-01: qty 50

## 2020-10-01 MED ORDER — LOPERAMIDE HCL 2 MG PO CAPS
2.0000 mg | ORAL_CAPSULE | ORAL | Status: DC | PRN
  Administered 2020-10-01 (×2): 2 mg via ORAL
  Filled 2020-10-01 (×4): qty 1

## 2020-10-01 MED ORDER — PNEUMOCOCCAL VAC POLYVALENT 25 MCG/0.5ML IJ INJ
0.5000 mL | INJECTION | INTRAMUSCULAR | Status: AC
  Administered 2020-10-02: 0.5 mL via INTRAMUSCULAR
  Filled 2020-10-01: qty 0.5

## 2020-10-01 MED ORDER — POTASSIUM CHLORIDE CRYS ER 20 MEQ PO TBCR
40.0000 meq | EXTENDED_RELEASE_TABLET | Freq: Once | ORAL | Status: AC
  Administered 2020-10-01: 40 meq via ORAL
  Filled 2020-10-01: qty 2

## 2020-10-01 MED ORDER — INFLUENZA VAC A&B SA ADJ QUAD 0.5 ML IM PRSY
0.5000 mL | PREFILLED_SYRINGE | INTRAMUSCULAR | Status: AC
  Administered 2020-10-02: 0.5 mL via INTRAMUSCULAR
  Filled 2020-10-01: qty 0.5

## 2020-10-01 NOTE — Progress Notes (Signed)
AM labs with K+ 3.7, Elink CCM protocol initiated. Creat 1.13, GFR  60

## 2020-10-01 NOTE — Progress Notes (Signed)
Nutrition Follow-up  DOCUMENTATION CODES:   Severe malnutrition in context of chronic illness  INTERVENTION:   - Ensure Enlive po TID, each supplement provides 350 kcal and 20 grams of protein  - Magic Cup TID with meals, each supplement provides 290 kcal and 9 grams of protein  NUTRITION DIAGNOSIS:   Severe Malnutrition related to chronic illness (CHF) as evidenced by severe fat depletion, severe muscle depletion, percent weight loss (25.7% weight loss in less than 6 months).  Ongoing  GOAL:   Patient will meet greater than or equal to 90% of their needs  Progressing  MONITOR:   Vent status, Labs, Weight trends, TF tolerance, Skin, I & O's  REASON FOR ASSESSMENT:   Ventilator, Consult Enteral/tube feeding initiation and management  ASSESSMENT:   74 year old male who presented to the ED on 11/04 in respiratory distress. PMH of CHF, HTN, HLD, T2DM, CKD stage III, right AKA, chronic loculated pleural effusions, PVD, cardiac arrest. Pt intubated in the ED. Pt admitted with sepsis likely secondary to pneumonia.  11/06 - extubated 11/07 - diet advanced to dysphagia 3 with thin liquids  Spoke with pt at bedside. Pt lethargic at time of visit. He states that he ate "a little eggs" for breakfast today. Meal completions over the last 3 meals recorded as 10-50%. Pt reports that he is going to try to eat more at lunch but he is just "very tired."  Pt amenable to consuming oral nutrition supplements and states that he likes strawberry Ensure Enlive. RD to order Ensure Enlive and Magic Cups with meals to aid pt in meeting kcal and protein needs.  Meal Completion: 10-50%  Medications reviewed and include: colace, SSI q 4 hours, senna, IV abx  Labs reviewed: BUN 26, hemoglobin 9.5 CBG's: 110-262 x 24 hours  UOP: 1050 ml x 24 hours I/O's: +1.6 L since admit  Diet Order:   Diet Order            DIET DYS 3 Room service appropriate? Yes; Fluid consistency: Thin  Diet effective  now                 EDUCATION NEEDS:   No education needs have been identified at this time  Skin:  Skin Assessment: Skin Integrity Issues: Stage II: left buttocks Unstageable: left heel  Last BM:  10/01/20 type 6  Height:   Ht Readings from Last 1 Encounters:  09/27/20 5\' 9"  (1.753 m)    Weight:   Wt Readings from Last 1 Encounters:  10/01/20 60.1 kg    Ideal Body Weight:  66.9 kg  BMI:  Body mass index is 19.57 kg/m.  Estimated Nutritional Needs:   Kcal:  1850-2050  Protein:  90-105 grams  Fluid:  >/= 1.8 L    13/08/21, MS, RD, LDN Inpatient Clinical Dietitian Please see AMiON for contact information.

## 2020-10-01 NOTE — Progress Notes (Signed)
PROGRESS NOTE    Jeremiah [name]  PPJ:[id number] DOB: [birth year] DOA: 09/27/2020 PCP: Annita Brod, MD   Brief Narrative:  74 yo with pmh chronic /systolic diastolic HF, htn, hyperlipidemia, T2DM, CKD3b, R AKA, chronic loculated pleural effusion (not decortication candidate), pvd, cardiac arrest, repeated hospitalizations who presented today to [facility] via EMS from [facility]. All history is obtained from chart and ED records 2/2 pt's unresponsive and intubated state. Pt was just discharged to this facility on 11/2. He was found this morning unresponsive with sats in the 60's. He was placed on face mask at 10L which improved sats to 88% and pt began to arouse. Unfortunately oxygen requirement escalated to NRB and sats continued to decline and he became unresponsive again and ultimately required intubation.  Upon evaluation pt was found to have leukocytosis to 26, acute on chronic renal failure with baseline Cr 1.1 now 1.5.  Assessment & Plan:   Active Problems:   Acute hypoxemic respiratory failure (HCC)   Acute respiratory failure (HCC)  Acute hypoxic resp failure:  - Acute on chronic hypercapnic respiratory failure in setting of underlying OSA intolerant to CPAP and increased oral secretions. Other etiologies include pneumonia vs aspiration. - Completed cefepime 10/01/2020 - Patient extubated 11/6 -patient remains on room air without need for supportive care -Patient is nonambulatory status post previous right lower extremity amputation, exertional oxygen screening limited - Continue CPAP overnight as he has history of OSA - will need new CPAP at discharge.  Sepsis 2/2 likely aspiration versus HCAP pneumonia, POA -Completed cefepime on the eighth as above -Cultures remain negative otherwise  H/o hypertension:  - Holding home meds  Acute encephalopathy 2/2 hypoxia, resolved -Patient A/O x4 this morning, back to baseline  Chronic normocytic anemia, of chronic  disease -Hemoglobin stable, no need for transfusion, no signs or symptoms of acute bleeding  Acute on Chronic Kidney Disease, Stage 3B, resolved: -Questionably ATN in the setting of sepsis respiratory failure and hypoxia -Creatinine back to baseline at this point, urine output improving appropriately  Chronic diastolic/systolic hf, not in acute exacerbation: - Limited echo 11/4 shows persistent pericardial effusion with no features of tamponade -Continue home medications  Type 2 diabetes, well controlled:  -Continue Accu-Chek, hypoglycemic protocol, sliding scale Lab Results  Component Value Date   HGBA1C 6.0 (H) 08/10/2020   DVT prophylaxis: Heparin Code Status: Full Family Communication: Updated son over the phone  Status is: Inpatient  Dispo: The patient is from: Facility              Anticipated d/c is to: Same              Anticipated d/c date is: 24 to 48 hours pending clinical course              Patient currently not medically stable for discharge given recent extubation, ongoing treatment for likely aspiration pneumonia and need for further evaluation for hypoxia  Consultants:   PCCM  Procedures:   Intubation  Antimicrobials:  Cefepime, completed 10/01/2020  Subjective: No acute issues or events overnight, patient feels quite well, denies palpitations, chest pain, nausea, vomiting, diarrhea, constipation, headache, fevers, chills, shortness of breath.  Objective: Vitals:   10/01/20 0400 10/01/20 0500 10/01/20 0530 10/01/20 0600  BP: (!) 154/68 (!) 169/66 136/68 (!) 146/63  Pulse: 78 99  83  Resp: (!) 23 17  20   Temp:      TempSrc:      SpO2: 95% 93%  95%  Weight:  60.1 kg    Height:        Intake/Output Summary (Last 24 hours) at 10/01/2020 0710 Last data filed at 09/30/2020 2153 Gross per 24 hour  Intake 820 ml  Output 1050 ml  Net -230 ml   Filed Weights   09/29/20 0454 09/30/20 0500 10/01/20 0500  Weight: 61.2 kg 60.1 kg 60.1 kg     Examination:  General exam: Appears calm and comfortable  Respiratory system: Clear to auscultation. Respiratory effort normal. Cardiovascular system: S1 & S2 heard, RRR. No JVD, murmurs, rubs, gallops or clicks. No pedal edema. Gastrointestinal system: Abdomen is nondistended, soft and nontender. No organomegaly or masses felt. Normal bowel sounds heard. Central nervous system: Alert and oriented. No focal neurological deficits. Extremities: Symmetric 5 x 5 power. Skin: No rashes, lesions or ulcers Psychiatry: Judgement and insight appear normal. Mood & affect appropriate.   Data Reviewed: I have personally reviewed following labs and imaging studies  CBC: Recent Labs  Lab 09/25/20 0143 09/25/20 0143 09/27/20 1107 09/27/20 1146 09/27/20 1326 09/28/20 0254 09/29/20 0020 09/30/20 0548 10/01/20 0141  WBC 12.3*   < > 26.1*  --   --  21.6* 12.4* 13.3* 13.0*  NEUTROABS 8.6*  --  23.5*  --   --   --   --   --   --   HGB 9.4*   < > 11.4*   < > 10.5* 8.9* 8.3* 9.4* 9.5*  HCT 29.7*   < > 38.0*   < > 31.0* 27.8* 26.1* 29.5* 30.3*  MCV 86.8   < > 92.2  --   --  84.8 86.1 87.0 85.6  PLT 226   < > 389  --   --  250 210 254 247   < > = values in this interval not displayed.   Basic Metabolic Panel: Recent Labs  Lab 09/27/20 1107 09/27/20 1146 09/27/20 1326 09/28/20 0254 09/28/20 1655 09/29/20 0020 09/29/20 1810 09/30/20 0548 10/01/20 0141  NA 141   < > 140 140  --  140  --  144 143  K 4.5   < > 4.1 3.6  --  3.4*  --  3.1* 3.7  CL 97*  --   --  102  --  104  --  103 105  CO2 35*  --   --  27  --  24  --  27 27  GLUCOSE 216*  --   --  132*  --  214*  --  133* 172*  BUN 27*  --   --  33*  --  44*  --  31* 26*  CREATININE 1.59*  --   --  1.83*  --  1.74*  --  1.20 1.13  CALCIUM 9.1  --   --  8.2*  --  8.1*  --  8.5* 8.6*  MG  --   --   --   --  2.0 1.9 1.8 1.7  --   PHOS  --   --   --   --  2.8 1.5* 5.1* 3.1  --    < > = values in this interval not displayed.    GFR: Estimated Creatinine Clearance: 47.3 mL/min (by C-G formula based on SCr of 1.13 mg/dL). Liver Function Tests: Recent Labs  Lab 09/27/20 1107  AST 17  ALT 26  ALKPHOS 137*  BILITOT 0.2*  PROT 8.2*  ALBUMIN 2.2*   No results for input(s): LIPASE, AMYLASE in the last 168  hours. No results for input(s): AMMONIA in the last 168 hours. Coagulation Profile: No results for input(s): INR, PROTIME in the last 168 hours. Cardiac Enzymes: No results for input(s): CKTOTAL, CKMB, CKMBINDEX, TROPONINI in the last 168 hours. BNP (last 3 results) No results for input(s): PROBNP in the last 8760 hours. HbA1C: No results for input(s): HGBA1C in the last 72 hours. CBG: Recent Labs  Lab 09/30/20 1205 09/30/20 1556 09/30/20 1923 09/30/20 2332 10/01/20 0335  GLUCAP 194* 262* 224* 125* 143*   Lipid Profile: No results for input(s): CHOL, HDL, LDLCALC, TRIG, CHOLHDL, LDLDIRECT in the last 72 hours. Thyroid Function Tests: No results for input(s): TSH, T4TOTAL, FREET4, T3FREE, THYROIDAB in the last 72 hours. Anemia Panel: No results for input(s): VITAMINB12, FOLATE, FERRITIN, TIBC, IRON, RETICCTPCT in the last 72 hours. Sepsis Labs: Recent Labs  Lab 09/27/20 1114 09/27/20 1324 09/28/20 1212 09/29/20 0020  PROCALCITON  --   --  37.40 33.17  LATICACIDVEN 1.2 2.2*  --   --     Recent Results (from the past 240 hour(s))  Respiratory Panel by RT PCR (Flu A&B, Covid) - Nasopharyngeal Swab     Status: None   Collection Time: 09/25/20 10:19 AM   Specimen: Nasopharyngeal Swab  Result Value Ref Range Status   SARS Coronavirus 2 by RT PCR NEGATIVE NEGATIVE Final    Comment: (NOTE) SARS-CoV-2 target nucleic acids are NOT DETECTED.  The SARS-CoV-2 RNA is generally detectable in upper respiratoy specimens during the acute phase of infection. The lowest concentration of SARS-CoV-2 viral copies this assay can detect is 131 copies/mL. A negative result does not preclude  SARS-Cov-2 infection and should not be used as the sole basis for treatment or other patient management decisions. A negative result may occur with  improper specimen collection/handling, submission of specimen other than nasopharyngeal swab, presence of viral mutation(s) within the areas targeted by this assay, and inadequate number of viral copies (<131 copies/mL). A negative result must be combined with clinical observations, patient history, and epidemiological information. The expected result is Negative.  Fact Sheet for Patients:  https://www.moore.com/  Fact Sheet for Healthcare Providers:  https://www.young.biz/  This test is no t yet approved or cleared by the [place] FDA and  has been authorized for detection and/or diagnosis of SARS-CoV-2 by FDA under an Emergency Use Authorization (EUA). This EUA will remain  in effect (meaning this test can be used) for the duration of the COVID-19 declaration under Section 564(b)(1) of the Act, 21 U.S.C. section 360bbb-3(b)(1), unless the authorization is terminated or revoked sooner.     Influenza A by PCR NEGATIVE NEGATIVE Final   Influenza B by PCR NEGATIVE NEGATIVE Final    Comment: (NOTE) The Xpert Xpress SARS-CoV-2/FLU/RSV assay is intended as an aid in  the diagnosis of influenza from Nasopharyngeal swab specimens and  should not be used as a sole basis for treatment. Nasal washings and  aspirates are unacceptable for Xpert Xpress SARS-CoV-2/FLU/RSV  testing.  Fact Sheet for Patients: https://www.moore.com/  Fact Sheet for Healthcare Providers: https://www.young.biz/  This test is not yet approved or cleared by the [place] FDA and  has been authorized for detection and/or diagnosis of SARS-CoV-2 by  FDA under an Emergency Use Authorization (EUA). This EUA will remain  in effect (meaning this test can be used) for the duration of the   Covid-19 declaration under Section 564(b)(1) of the Act, 21  U.S.C. section 360bbb-3(b)(1), unless the authorization is  terminated or revoked. Performed at  [facility], [address] [place], [place] [address]   Respiratory Panel by RT PCR (Flu A&B, Covid) - Nasopharyngeal Swab     Status: None   Collection Time: 09/27/20 11:07 AM   Specimen: Nasopharyngeal Swab  Result Value Ref Range Status   SARS Coronavirus 2 by RT PCR NEGATIVE NEGATIVE Final    Comment: (NOTE) SARS-CoV-2 target nucleic acids are NOT DETECTED.  The SARS-CoV-2 RNA is generally detectable in upper respiratoy specimens during the acute phase of infection. The lowest concentration of SARS-CoV-2 viral copies this assay can detect is 131 copies/mL. A negative result does not preclude SARS-Cov-2 infection and should not be used as the sole basis for treatment or other patient management decisions. A negative result may occur with  improper specimen collection/handling, submission of specimen other than nasopharyngeal swab, presence of viral mutation(s) within the areas targeted by this assay, and inadequate number of viral copies (<131 copies/mL). A negative result must be combined with clinical observations, patient history, and epidemiological information. The expected result is Negative.  Fact Sheet for Patients:  https://www.moore.com/  Fact Sheet for Healthcare Providers:  https://www.young.biz/  This test is no t yet approved or cleared by the [place] FDA and  has been authorized for detection and/or diagnosis of SARS-CoV-2 by FDA under an Emergency Use Authorization (EUA). This EUA will remain  in effect (meaning this test can be used) for the duration of the COVID-19 declaration under Section 564(b)(1) of the Act, 21 U.S.C. section 360bbb-3(b)(1), unless the authorization is terminated or revoked sooner.     Influenza A by PCR NEGATIVE NEGATIVE  Final   Influenza B by PCR NEGATIVE NEGATIVE Final    Comment: (NOTE) The Xpert Xpress SARS-CoV-2/FLU/RSV assay is intended as an aid in  the diagnosis of influenza from Nasopharyngeal swab specimens and  should not be used as a sole basis for treatment. Nasal washings and  aspirates are unacceptable for Xpert Xpress SARS-CoV-2/FLU/RSV  testing.  Fact Sheet for Patients: https://www.moore.com/  Fact Sheet for Healthcare Providers: https://www.young.biz/  This test is not yet approved or cleared by the [place] FDA and  has been authorized for detection and/or diagnosis of SARS-CoV-2 by  FDA under an Emergency Use Authorization (EUA). This EUA will remain  in effect (meaning this test can be used) for the duration of the  Covid-19 declaration under Section 564(b)(1) of the Act, 21  U.S.C. section 360bbb-3(b)(1), unless the authorization is  terminated or revoked. Performed at [facility], [address] [place], [place] [address]   Culture, blood (routine x 2)     Status: None (Preliminary result)   Collection Time: 09/27/20 11:14 AM   Specimen: Site Not Specified; Blood  Result Value Ref Range Status   Specimen Description SITE NOT SPECIFIED  Final   Special Requests   Final    BOTTLES DRAWN AEROBIC AND ANAEROBIC Blood Culture adequate volume   Culture   Final    NO GROWTH 3 DAYS Performed at [facility], [address], [place], [place] [address]    Report Status PENDING  Incomplete  Culture, blood (routine x 2)     Status: None (Preliminary result)   Collection Time: 09/27/20 11:28 AM   Specimen: BLOOD LEFT WRIST  Result Value Ref Range Status   Specimen Description BLOOD LEFT WRIST  Final   Special Requests   Final    BOTTLES DRAWN AEROBIC AND ANAEROBIC Blood Culture adequate volume   Culture  Final    NO GROWTH 3 DAYS Performed at [facility], [address] [place], [place] [address]    Report  Status PENDING  Incomplete  MRSA PCR Screening     Status: None   Collection Time: 09/27/20  5:05 PM   Specimen: Nasopharyngeal  Result Value Ref Range Status   MRSA by PCR NEGATIVE NEGATIVE Final    Comment:        The GeneXpert MRSA Assay (FDA approved for NASAL specimens only), is one component of a comprehensive MRSA colonization surveillance program. It is not intended to diagnose MRSA infection nor to guide or monitor treatment for MRSA infections. Performed at [facility], [address] [place], [place] [address]     Radiology Studies: No results found.  Scheduled Meds: . aspirin  81 mg Oral Daily  . brimonidine  1 drop Both Eyes BID  . Chlorhexidine Gluconate Cloth  6 each Topical Daily  . clopidogrel  75 mg Oral Daily  . docusate sodium  100 mg Oral BID  . heparin  5,000 Units Subcutaneous Q8H  . insulin aspart  0-15 Units Subcutaneous Q4H  . mouth rinse  15 mL Mouth Rinse BID  . senna  1 tablet Oral BID  . timolol  1 drop Both Eyes BID   Continuous Infusions: . sodium chloride 250 mL (09/30/20 0819)  . ceFEPime (MAXIPIME) IV 2 g (09/30/20 2153)  . sodium chloride Stopped (09/27/20 1140)     LOS: 4 days   Time spent: 40min  Azucena FallenWilliam C Timber Marshman, DO [facility]  If 7PM-7AM, please contact night-coverage [email or web address]  10/01/2020, 7:10 AM

## 2020-10-02 LAB — BASIC METABOLIC PANEL
Anion gap: 10 (ref 5–15)
BUN: 22 mg/dL (ref 8–23)
CO2: 27 mmol/L (ref 22–32)
Calcium: 8.6 mg/dL — ABNORMAL LOW (ref 8.9–10.3)
Chloride: 104 mmol/L (ref 98–111)
Creatinine, Ser: 1.06 mg/dL (ref 0.61–1.24)
GFR, Estimated: >60 mL/min (ref >60)
Glucose, Bld: 119 mg/dL — ABNORMAL HIGH (ref 70–99)
Potassium: 4 mmol/L (ref 3.5–5.1)
Sodium: 141 mmol/L (ref 135–145)

## 2020-10-02 LAB — CULTURE, BLOOD (ROUTINE X 2)
Culture: NO GROWTH
Culture: NO GROWTH
Special Requests: ADEQUATE
Special Requests: ADEQUATE

## 2020-10-02 LAB — CBC
HCT: 29.5 % — ABNORMAL LOW (ref 39.0–52.0)
Hemoglobin: 9.3 g/dL — ABNORMAL LOW (ref 13.0–17.0)
MCH: 27.1 pg (ref 26.0–34.0)
MCHC: 31.5 g/dL (ref 30.0–36.0)
MCV: 86 fL (ref 80.0–100.0)
Platelets: 245 10*3/uL (ref 150–400)
RBC: 3.43 MIL/uL — ABNORMAL LOW (ref 4.22–5.81)
RDW: 17.8 % — ABNORMAL HIGH (ref 11.5–15.5)
WBC: 11.6 10*3/uL — ABNORMAL HIGH (ref 4.0–10.5)
nRBC: 0 % (ref 0.0–0.2)

## 2020-10-02 LAB — GLUCOSE, CAPILLARY
Glucose-Capillary: 104 mg/dL — ABNORMAL HIGH (ref 70–99)
Glucose-Capillary: 128 mg/dL — ABNORMAL HIGH (ref 70–99)
Glucose-Capillary: 245 mg/dL — ABNORMAL HIGH (ref 70–99)
Glucose-Capillary: 133 mg/dL — ABNORMAL HIGH (ref 70–99)
Glucose-Capillary: 75 mg/dL (ref 70–99)
Glucose-Capillary: 142 mg/dL — ABNORMAL HIGH (ref 70–99)

## 2020-10-02 NOTE — Evaluation (Signed)
Occupational Therapy Evaluation Patient Details Name: Jeremiah [name] MRN: [id number] DOB: 08/04/1946 Today's Date: 10/02/2020    History of Present Illness Pt is a 74 y/o male with PMH of chronic systolic diastolic HF, HTN, T2DM, R AKA, chronic loculated pleural effusion, PVD, cardiac arrest, repeated hospitalizations presenting from [facility] due to unresponsive and intubated state. Found in acute hypoxic respiratory failure, extubated 11/6, sepsis likely to aspiration.    Clinical Impression   Patient from SNF, reports w/c level and assist required for ADLs.  Admitted for above and limited by problem list below, including impaired balance, decreased activity tolerance, generalized weakness, and impaired cognition. Pt on RA with VSS during session. Requires mod assist for bed mobility, max assist +2 for lateral scoot transfers to recliner, min-total assist +2 for ADLs.  He will benefit from further OT services while admitted and after dc at SNF level to optimize independence and safety with ADls, mobility to decrease burden of care.      Follow Up Recommendations  SNF;Supervision/Assistance - 24 hour    Equipment Recommendations  None recommended by OT    Recommendations for Other Services       Precautions / Restrictions Precautions Precautions: Fall Precaution Comments: Rt AKA Restrictions Other Position/Activity Restrictions: R AKA       Mobility Bed Mobility Overal bed mobility: Needs Assistance Bed Mobility: Supine to Sit     Supine to sit: Mod assist     General bed mobility comments: Assist to bring LE's off EOB, elevate trunk into sitting and bring hips to EOB.     Transfers Overall transfer level: Needs assistance Equipment used: None Transfers: Lateral/Scoot Transfers          Lateral/Scoot Transfers: +2 physical assistance;Max assist General transfer comment: Used bed pad to scoot hips from bed to drop arm recliner. Pt able to assist some with UE's.  Verbal cues for hand placement and progression.     Balance Overall balance assessment: Needs assistance Sitting-balance support: Bilateral upper extremity supported;Feet supported Sitting balance-Leahy Scale: Poor Sitting balance - Comments: Pt sat EOB with UE support and min assist primarily with occasional mod assist with loss of balance to the lt.  Postural control: Left lateral lean                                 ADL either performed or assessed with clinical judgement   ADL Overall ADL's : At baseline;Needs assistance/impaired     Grooming: Set up;Sitting   Upper Body Bathing: Set up;Sitting   Lower Body Bathing: Maximal assistance;Sitting/lateral leans;+2 for physical assistance;+2 for safety/equipment   Upper Body Dressing : Minimal assistance;Sitting   Lower Body Dressing: Maximal assistance;+2 for physical assistance;+2 for safety/equipment;Sitting/lateral leans   Toilet Transfer: Maximal assistance;+2 for physical assistance;+2 for safety/equipment Toilet Transfer Details (indicate cue type and reason): lateral scoot simulated to recliner          Functional mobility during ADLs: Maximal assistance;+2 for physical assistance;+2 for safety/equipment;Cueing for safety;Cueing for sequencing General ADL Comments: pt limited by weakness, impaired balance, decreased activity tolerance     Vision   Additional Comments: reports visual loss R eye, but appears baseline (in previous notes)      Perception     Praxis      Pertinent Vitals/Pain Pain Assessment: No/denies pain     Hand Dominance Right   Extremity/Trunk Assessment Upper Extremity Assessment Upper Extremity Assessment: Generalized weakness  Lower Extremity Assessment Lower Extremity Assessment: Defer to PT evaluation RLE Deficits / Details: AKA. Strength <3/5 LLE Deficits / Details: Strength 2+/5       Communication Communication Communication: No difficulties   Cognition  Arousal/Alertness: Awake/alert Behavior During Therapy: Flat affect Overall Cognitive Status: No family/caregiver present to determine baseline cognitive functioning Area of Impairment: Following commands;Problem solving;Memory;Orientation                 Orientation Level: Disoriented to;Situation   Memory: Decreased short-term memory Following Commands: Follows one step commands with increased time     Problem Solving: Slow processing;Decreased initiation;Requires verbal cues General Comments: increased time required to follow simple commands, decreased awareness of situation    General Comments  VSS on RA     Exercises     Shoulder Instructions      Home Living Family/patient expects to be discharged to:: Skilled nursing facility                                 Additional Comments: from [facility]      Prior Functioning/Environment Level of Independence: Needs assistance  Gait / Transfers Assistance Needed: Assist with scoot transfer to w/c. Non ambulatory since 01/2020.  ADL's / Homemaking Assistance Needed: Assist with bathing and dressing. Reports he doesn't get up to commode            OT Problem List: Decreased strength;Decreased activity tolerance;Impaired balance (sitting and/or standing);Decreased safety awareness;Decreased knowledge of use of DME or AE;Decreased knowledge of precautions;Decreased cognition      OT Treatment/Interventions: Self-care/ADL training;Energy conservation;DME and/or AE instruction;Cognitive remediation/compensation;Therapeutic activities;Patient/family education;Balance training    OT Goals(Current goals can be found in the care plan section) Acute Rehab OT Goals Patient Stated Goal: get to rehab OT Goal Formulation: With patient Time For Goal Achievement: 10/16/20 Potential to Achieve Goals: Good  OT Frequency: Min 2X/week   Barriers to D/C:            Co-evaluation PT/OT/SLP Co-Evaluation/Treatment:  Yes Reason for Co-Treatment: For patient/therapist safety;To address functional/ADL transfers   OT goals addressed during session: ADL's and self-care      AM-PAC OT "6 Clicks" Daily Activity     Outcome Measure Help from another person eating meals?: A Little Help from another person taking care of personal grooming?: A Little Help from another person toileting, which includes using toliet, bedpan, or urinal?: A Lot Help from another person bathing (including washing, rinsing, drying)?: A Lot Help from another person to put on and taking off regular upper body clothing?: A Little Help from another person to put on and taking off regular lower body clothing?: A Lot 6 Click Score: 15   End of Session Nurse Communication: Mobility status  Activity Tolerance: Patient tolerated treatment well Patient left: in chair;with call bell/phone within reach;with chair alarm set;with nursing/sitter in room  OT Visit Diagnosis: Other abnormalities of gait and mobility (R26.89);Muscle weakness (generalized) (M62.81)                Time: 6160-7371 OT Time Calculation (min): 25 min Charges:  OT General Charges $OT Visit: 1 Visit OT Evaluation $OT Eval Moderate Complexity: 1 Mod  Barry Brunner, OT Acute Rehabilitation Services Pager [phone] Office [phone]   Chancy Milroy 10/02/2020, 10:03 AM

## 2020-10-02 NOTE — Discharge Summary (Signed)
Physician Discharge Summary  Jeremiah [name] CBS:[id number] DOB: 1946/06/13 DOA: 09/27/2020  PCP: Jeremiah Brod, MD  Admit date: 09/27/2020 Discharge date: 10/02/2020  Admitted From: SNF Disposition: SNF  Recommendations for Outpatient Follow-up:  1. Follow up with PCP in 1-2 weeks 2. Please obtain BMP/CBC in one week   Discharge Condition: Stable CODE STATUS: Full Diet recommendation: Regular diet, low-salt low-fat  Brief/Interim Summary: 74 yo with pmh chronic /systolic diastolic HF, htn, hyperlipidemia, T2DM, CKD3b, R AKA, chronic loculated pleural effusion (not decortication candidate), pvd, cardiac arrest, repeated hospitalizations who presented today to [facility] via EMS from [facility]. All history is obtained from chart and ED records 2/2 pt's unresponsive and intubated state. Pt was just discharged to this facility on 11/2. He was found this morning unresponsive with sats in the 60's. He was placed on face mask at 10L which improved sats to 88% and pt began to arouse. Unfortunately oxygen requirement escalated to NRB and sats continued to decline and he became unresponsive again and ultimately required intubation.  Upon evaluation pt was found to have leukocytosis to 26, acute on chronic renal failure with baseline Cr 1.1 now 1.5.  Patient admitted as above with acute hypoxic respiratory failure in the setting of likely aspiration versus HCAP pneumonia initially requiring intubation, patient successfully extubated on 09/29/2020 to room air, completed antibiotics on 10/01/2020 is otherwise stable and agreeable for discharge back to facility.  Lengthy discussion with patient as well as family about need for medication compliance, patient is diagnosed with sleep apnea previously but has not worn a CPAP mask in quite some time, if ever, will attempt to set up patient for outpatient CPAP at discharge although patient continues to decline use of CPAP in house unclear if he will be  compliant outpatient despite our lengthy discussion.  Patient's other chronic comorbid conditions including CKD 3B normocytic anemia hypertension diabetes appear to be moderately well controlled.Marland Kitchen  Discharge Diagnoses:  Active Problems:   Acute hypoxemic respiratory failure (HCC)   Acute respiratory failure [facility])    Discharge Instructions  Discharge Instructions    Call MD for:  difficulty breathing, headache or visual disturbances   Complete by: As directed    Call MD for:  temperature >100.4   Complete by: As directed    Diet general   Complete by: As directed    DIET DYS 3 ; Fluid consistency: Thin   Discharge wound care:   Complete by: As directed    Apply Iodine from the swab pads or swab sticks in clean utility, to the left heel wound. Float heel off of mattress   Increase activity slowly   Complete by: As directed      Allergies as of 10/02/2020   No Known Allergies     Medication List    TAKE these medications   aspirin 81 MG tablet Take 81 mg by mouth daily.   atorvastatin 20 MG tablet Commonly known as: LIPITOR Take 1 tablet (20 mg total) by mouth daily. What changed: when to take this   brimonidine 0.2 % ophthalmic solution Commonly known as: ALPHAGAN Place 1 drop into both eyes 2 (two) times daily.   clopidogrel 75 MG tablet Commonly known as: PLAVIX Take 1 tablet (75 mg total) by mouth daily with breakfast.   ferrous sulfate 325 (65 FE) MG tablet Take 1 tablet (325 mg total) by mouth 2 (two) times daily with a meal.   gabapentin 300 MG capsule Commonly known as: NEURONTIN Take  1 capsule (300 mg total) by mouth 2 (two) times daily.   glimepiride 1 MG tablet Commonly known as: AMARYL Take 1 tablet (1 mg total) by mouth daily with breakfast.   glucose blood test strip Accu-Chek Aviva Plus test strips  Take 1 strip 3 times a day by miscell. route for 90 days.   glucose blood test strip Accu-Chek Aviva Plus test strips  USE AS DIRECTED THREE  TIMES DAILY.   hydrALAZINE 100 MG tablet Commonly known as: APRESOLINE Take 0.5 tablets (50 mg total) by mouth 2 (two) times daily.   metoprolol succinate 25 MG 24 hr tablet Commonly known as: TOPROL-XL Take 1 tablet (25 mg total) by mouth daily.   pantoprazole 40 MG tablet Commonly known as: PROTONIX Take 1 tablet (40 mg total) by mouth daily.   psyllium 58.6 % powder Commonly known as: METAMUCIL Take 1 packet by mouth daily.   timolol 0.5 % ophthalmic solution Commonly known as: TIMOPTIC Place 1 drop into both eyes 2 (two) times daily.            Discharge Care Instructions  (From admission, onward)         Start     Ordered   10/02/20 0000  Discharge wound care:       Comments: Apply Iodine from the swab pads or swab sticks in clean utility, to the left heel wound. Float heel off of mattress   10/02/20 0726          No Known Allergies  Consultations:  PCCM   Procedures/Studies: DG Chest 1 View  Result Date: 09/18/2020 CLINICAL DATA:  Central catheter placement.  Hypoxia. EXAM: CHEST  1 VIEW COMPARISON:  September 18, 2020 chest radiograph; chest CT September 14, 2020 FINDINGS: There is a left jugular catheter with the tip either at or overlying the lateral aspect of the aortic arch. This catheter tip may reside within a left superior intercostal vein. Arterial placement cannot be excluded, however. Endotracheal tube tip is 4.6 cm above the carina. Nasogastric tube tip and side port are below the diaphragm. No evident pneumothorax. There are pleural effusions bilaterally with airspace opacity throughout much of the right lung. A lucent area along the inferolateral right base is stable compared to earlier in the day there is subtle ill-defined opacity in portions of the left lower lung region. Heart is upper normal in size with pulmonary vascularity normal. No adenopathy. Probable small infarct in the right proximal humerus. IMPRESSION: 1. New left subclavian catheter  present with tip either in a left superior intercostal vein or possibly within the lateral aspect of the aortic arch. Advise sampling of blood from the catheter to ascertain venous versus arterial placement. 2. Endotracheal and nasogastric tube positions as described. No pneumothorax. 3. Persistent small pleural effusions bilaterally. Multifocal airspace opacity, considerably more on the right than on the left, stable compared to earlier in the day. Note the lucency in the lateral right base persists which potentially may represent focal developing abscess/focus of empyema. This finding was also present on prior CT examination September 14, 2020, supporting suspicion for infectious etiology for this lucency. These results will be called to the ordering clinician or representative by the Radiologist Assistant, and communication documented in the PACS or [employer]. 4. Stable cardiac silhouette. Aortic Atherosclerosis (ICD10-I70.0). Electronically Signed   By: Bretta Bang III M.D.   On: 09/18/2020 14:27   DG Chest 2 View  Result Date: 09/24/2020 CLINICAL DATA:  Shortness of breath. EXAM:  CHEST - 2 VIEW COMPARISON:  September 21, 2020. FINDINGS: Stable cardiomegaly. Probable central pulmonary vascular congestion is noted. No pneumothorax is noted. Bilateral perihilar and basilar opacities are noted concerning for edema or atelectasis with associated pleural effusions. Bony thorax is unremarkable. IMPRESSION: Stable cardiomegaly with central pulmonary vascular congestion. Bilateral perihilar and basilar opacities are noted concerning for edema or atelectasis with associated pleural effusions. Electronically Signed   By: Lupita Raider M.D.   On: 09/24/2020 08:05   CT Head Wo Contrast  Result Date: 09/27/2020 CLINICAL DATA:  Found unresponsive at rehab facility, oxygen desaturation in the 60s%, delirium EXAM: CT HEAD WITHOUT CONTRAST TECHNIQUE: Contiguous axial images were obtained from the base of the  skull through the vertex without intravenous contrast. Sagittal and coronal MPR images reconstructed from axial data set. COMPARISON:  08/09/2020 FINDINGS: Brain: Generalized atrophy. Normal ventricular morphology. No midline shift or mass effect. Small vessel chronic ischemic changes of deep cerebral white matter. No intracranial hemorrhage, mass lesion, evidence of acute infarction, or extra-axial fluid collection. Tiny parenchymal calcification noted centrally within LEFT pons unchanged. Vascular: Atherosclerotic calcifications of internal carotid and vertebral arteries at skull base Skull: Intact Sinuses/Orbits: Minimal fluid LEFT maxillary sinus. Other: Endotracheal and nasogastric tubes traverse oral cavity. IMPRESSION: Atrophy with small vessel chronic ischemic changes of deep cerebral white matter. No acute intracranial abnormalities. Electronically Signed   By: Ulyses Southward M.D.   On: 09/27/2020 12:22   CT Chest Wo Contrast  Result Date: 09/14/2020 CLINICAL DATA:  Pleural effusion, worsening hypoxia, multifocal pneumonia EXAM: CT CHEST WITHOUT CONTRAST TECHNIQUE: Multidetector CT imaging of the chest was performed following the standard protocol without IV contrast. COMPARISON:  08/27/2020 FINDINGS: Cardiovascular: Extensive multi-vessel coronary artery calcification. Mild global cardiomegaly. Mild left ventricular dilation. Relative hypoattenuation of the cardiac blood pool is in keeping with at least moderate anemia. No pericardial effusion. Central pulmonary arteries are enlarged in keeping with pulmonary arterial hypertension. Moderate atherosclerotic calcification is seen within the thoracic aorta. No aortic aneurysm. Mediastinum/Nodes: Visualized thyroid unremarkable. Shotty mediastinal adenopathy is likely reactive in nature. Right hilar adenopathy is not well assessed due to confluent pulmonary parenchymal disease on this noncontrast examination. The esophagus is unremarkable. Lungs/Pleura:  There is persistent subtotal collapse of the left lower lobe and total collapse of the lateral segment of the right middle lobe and the entire right lower lobe. Stable consolidation within the superior segment of the a right lower lobe and progressive consolidation involving the dependent right upper lobe. Increasing bronchial wall thickening within the aerated right upper lobe in keeping with airway inflammation. Stable trace interstitial pulmonary infiltrate most in keeping with superimposed mild interstitial pulmonary edema. Unchanged complex gas and fluid containing collection within the right pleural space most in keeping with a empyema measuring at least 5.2 x 13.0 x 10.7 cm in greatest dimension. There is associated pleural thickening, best noted at the lung base, in keeping with pleural inflammation. On the left, partially loculated small left pleural effusion is again seen with loculated fluid within the major fissure, unchanged. Mild pleural thickening is again noted, suggesting a complex effusion such as a parapneumonic effusion. Superinfection is difficult to exclude on this examination. Upper Abdomen: No acute abnormality. Musculoskeletal: No acute bone abnormality. IMPRESSION: Progressive consolidation within the right upper lobe. Stable collapse and consolidation of the right middle lobe and lower lobes bilaterally. Stable complex gas and fluid collection within the right pleural space with associated pleural thickening most in keeping with a a empyema.  Stable complex partially loculated small left pleural effusion largely within the interlobular fissure. Stable superimposed mild interstitial pulmonary edema. Extensive coronary artery calcification. Morphologic changes of pulmonary arterial hypertension. Aortic Atherosclerosis (ICD10-I70.0). Electronically Signed   By: Helyn Numbers MD   On: 09/14/2020 17:05   US RENAL  Result Date: 09/27/2020 CLINICAL DATA:  Renal failure EXAM: RENAL / URINARY  TRACT ULTRASOUND COMPLETE COMPARISON:  None. FINDINGS: Right Kidney: Renal measurements: 11.7 x 4.0 x 4.9 cm = volume: 120 mL. Echogenicity within normal limits. There is an anechoic cyst seen in the lower pole measuring 2.2 x 1.4 x 1 4 cm Left Kidney: Renal measurements: 10.1 x 4.2 x 4.0 cm = volume: 88 mL. Echogenicity within normal limits. No mass or hydronephrosis visualized. Bladder: The bladder is decompressed. Other: None. IMPRESSION: Right renal cyst. No renal calculi or hydronephrosis. Electronically Signed   By: Jonna Clark M.D.   On: 09/27/2020 17:54   US RENAL  Result Date: 09/14/2020 CLINICAL DATA:  Acute renal injury EXAM: RENAL / URINARY TRACT ULTRASOUND COMPLETE COMPARISON:  None. FINDINGS: Right Kidney: Renal measurements: 10.5 x 5.1 x 5.6 cm. = volume: 158 mL. 1.5 cm cyst is noted in the upper pole. No mass lesion or hydronephrosis is noted. Small extrarenal pelvis is seen. Left Kidney: Renal measurements: 9.4 x 4.5 x 4.6 cm. = volume: 103 mL. Echogenicity within normal limits. No mass or hydronephrosis visualized. Bladder: Appears normal for degree of bladder distention. Other: Note is made of a small left-sided pleural effusion. IMPRESSION: Small left pleural effusion. Right renal cyst. No other focal abnormality is noted. Electronically Signed   By: Alcide Clever M.D.   On: 09/14/2020 18:46   DG Chest 1V REPEAT Same Day  Result Date: 09/18/2020 CLINICAL DATA:  74 year old male status post chest tube placement. EXAM: CHEST - 1 VIEW SAME DAY COMPARISON:  Earlier radiograph dated 09/18/2020. FINDINGS: Interval placement of a pigtail right chest tube with tip over the inferior aspect of the right lung. No significant interval change in the size of right pleural collection or air compared to the earlier radiograph. Right subclavian central venous line with tip over central SVC close to the cavoatrial junction. Endotracheal tube remains above the carina and enteric tube extends below the  diaphragm with tip beyond the inferior margin of the image. Left IJ central venous line in similar position. Diffuse parenchymal densities and bilateral pleural effusions, similar to prior radiograph. Stable cardiac silhouette. No acute osseous pathology. IMPRESSION: 1. Interval placement of a right chest tube. No significant interval change in the size of the right pleural collection or air compared to the earlier radiograph. 2. Left IJ central venous line in similar position. Clinical correlation is recommended. Electronically Signed   By: Elgie Collard M.D.   On: 09/18/2020 16:37   DG Chest Port 1 View  Result Date: 09/28/2020 CLINICAL DATA:  Respiratory failure. EXAM: PORTABLE CHEST 1 VIEW COMPARISON:  09/27/2020 FINDINGS: ETT tip is stable above the carina. There is a enteric tube with tip below the level of the GE junction. Stable cardiomediastinal contours. Right pleural effusion is again noted with asymmetric veil like opacification of the right mid and lower lung. Hazy left mid and lower lung zone interstitial opacities are unchanged. IMPRESSION: 1. Stable support apparatus. 2. No change in aeration to the lungs compared with previous exam. 3. Stable right pleural effusion. Electronically Signed   By: Signa Kell M.D.   On: 09/28/2020 05:03   DG Chest Portable 1  View  Result Date: 09/27/2020 CLINICAL DATA:  Hypoxia. EXAM: PORTABLE CHEST 1 VIEW COMPARISON:  September 24, 2020. FINDINGS: Stable cardiomediastinal silhouette. Endotracheal and nasogastric tubes are in grossly good position. No pneumothorax is noted. Mild bibasilar atelectasis or edema are noted with bilateral pleural effusions, right greater than left. Bony thorax is unremarkable. IMPRESSION: Mild bibasilar atelectasis or edema are noted with bilateral pleural effusions, right greater than left. Endotracheal and nasogastric tubes are in grossly good position. Aortic Atherosclerosis (ICD10-I70.0). Electronically Signed   By: Lupita Raider M.D.   On: 09/27/2020 11:21   DG Chest Port 1 View  Result Date: 09/21/2020 CLINICAL DATA:  Hypoxia. EXAM: PORTABLE CHEST 1 VIEW COMPARISON:  09/20/2020 FINDINGS: Endotracheal tube and nasogastric tube have been removed. RIGHT subclavian line has been removed. Heart is enlarged in also accentuated by technique, stable in appearance. There are patchy opacities throughout the lungs bilaterally. There are bilateral pleural effusions. Lucency at the LATERAL RIGHT lung base appears stable and likely represents known empyema. There is increased atelectasis or early infiltrate in the LEFT LATERAL lung base. IMPRESSION: 1. Persistent bilateral infiltrates and pleural effusions. 2. Stable appearance of RIGHT empyema. 3. Increased atelectasis or early infiltrate in the LEFT LATERAL lung base. Electronically Signed   By: Norva Pavlov M.D.   On: 09/21/2020 18:33   DG CHEST PORT 1 VIEW  Result Date: 09/20/2020 CLINICAL DATA:  Respiratory failure. EXAM: PORTABLE CHEST 1 VIEW COMPARISON:  09/19/2020. FINDINGS: Endotracheal tube, NG tube, right subclavian line in stable position. Interim removal of right chest tube. Persistent air collection over the right lower chest most likely in the pleural space possibly related empyema. Persistent right lower lobe infiltrate. Persistent mild left base subsegmental atelectasis. No evidence of increasing pleural effusion on the right. Degenerative change thoracic spine. Evidence of old infarct in the proximal right humerus. IMPRESSION: 1. Interim removal of right chest tube. Persistent air collection over the right lower chest most likely in the pleural space possibly related to empyema. No interim change. 2. Persistent right lower lobe infiltrate. Persistent mild left base subsegmental atelectasis. No evidence of increasing pleural effusion on the right. Chest is unchanged from prior exam. Electronically Signed   By: Maisie Fus  Register   On: 09/20/2020 05:21   DG Chest  Port 1 View  Result Date: 09/19/2020 CLINICAL DATA:  Hypoxia.  Recent cardiac arrest EXAM: PORTABLE CHEST 1 VIEW COMPARISON:  September 18, 2020 chest radiograph; chest CT September 14, 2020 FINDINGS: Endotracheal tube tip is 3.5 cm above the carina. Nasogastric tube tip and side port are below the diaphragm. Central catheter tip is in the superior vena cava. Chest tube is again noted on the right, unchanged in position. No appreciable pneumothorax. Pleural effusions bilaterally, larger on the right than on the left noted. Airspace consolidation right lower lung region with focal air along the lateral right hemithorax, likely due to empyema/developing abscess. There is less opacity in the left base compared to 1 day prior with mild left base atelectasis noted. Heart borderline enlarged with pulmonary vascularity normal. There is aortic atherosclerosis. No adenopathy. No bone lesions. IMPRESSION: Tube and catheter positions as described without pneumothorax. Stable chest tube positioning. Bilateral pleural effusions with airspace opacity right lower lung region, stable. Air along the inferolateral right hemithorax is stable, likely due to empyema/developing abscess. Less opacity left base compared to 1 day prior with mild left base atelectasis present. Stable cardiac silhouette. Aortic Atherosclerosis (ICD10-I70.0). Electronically Signed   By: Bretta Bang  III M.D.   On: 09/19/2020 08:55   DG Chest Port 1 View  Result Date: 09/18/2020 CLINICAL DATA:  Abnormal respiration.  Evaluate tube placement. EXAM: PORTABLE CHEST 1 VIEW COMPARISON:  Chest radiograph earlier today.  Chest CT 09/14/2020 FINDINGS: Left internal jugular line has been removed. Unchanged endotracheal tube tip 4.2 cm from the carina. Enteric tube in place with tip below the diaphragm not included in the field of view. Right subclavian central line tip in the SVC. Pigtail catheter at the right lung base is unchanged in position. Extrapleural  air collection at the right lung base is not significantly changed, empyema demonstrated on CT. No pneumothorax. Left pleural effusion and basilar airspace disease, not significantly changed. Additional multifocal airspace opacities are unchanged. Stable cardiomegaly. IMPRESSION: 1. Interval removal of left internal jugular line. Remaining support apparatus is unchanged. 2. Unchanged extrapleural air collection at the right lung base, empyema demonstrated on CT. 3. Unchanged left pleural effusion and basilar airspace disease. Additional multifocal airspace opacities are unchanged. 4. Stable cardiomegaly Electronically Signed   By: Narda Rutherford M.D.   On: 09/18/2020 21:17   DG Chest Port 1 View  Result Date: 09/18/2020 CLINICAL DATA:  Abnormal respirations. EXAM: PORTABLE CHEST 1 VIEW COMPARISON:  09/18/2020 FINDINGS: The endotracheal tube tip is stable above the carina. There is a nasogastric tube with tip below the GE junction. Cardiomediastinal contours are stable. No change in bilateral pulmonary opacities. Unchanged appearance of loculated gas and fluid collection overlying the inferolateral right lung base. Left pleural effusion is also unchanged. IMPRESSION: 1. No change in aeration to the lungs compared with previous exam. 2. Stable support apparatus. Electronically Signed   By: Signa Kell M.D.   On: 09/18/2020 11:45   DG CHEST PORT 1 VIEW  Result Date: 09/18/2020 CLINICAL DATA:  Sudden onset shortness of breath EXAM: PORTABLE CHEST 1 VIEW COMPARISON:  09/17/2020 FINDINGS: Tubing overlies the left chest and mediastinum and is presumably external. Persistent right greater left pulmonary opacities. Right pleural gas and fluid collection appears similar. Stable cardiomediastinal contours. IMPRESSION: Similar lung aeration with right greater than left opacities. Right pleural gas and fluid collection also appears similar. Electronically Signed   By: Guadlupe Spanish M.D.   On: 09/18/2020 08:09    DG Chest Port 1 View  Result Date: 09/17/2020 CLINICAL DATA:  Dyspnea EXAM: PORTABLE CHEST 1 VIEW COMPARISON:  09/14/2020 FINDINGS: Lung aeration is similar with bilateral pulmonary opacities persisting. There is greater opacification at the right lung base. Right pleural gas and fluid collection appears slightly increased. Similar cardiomediastinal contours. IMPRESSION: Slight increase in right pleural gas and fluid collection. Persistent bilateral pulmonary opacities with greater opacification at the right lung base. Electronically Signed   By: Guadlupe Spanish M.D.   On: 09/17/2020 09:06   DG Chest Portable 1 View  Result Date: 09/14/2020 CLINICAL DATA:  Shortness of breath. EXAM: PORTABLE CHEST 1 VIEW COMPARISON:  08/26/2020 radiographs and 08/27/2020 CT chest. FINDINGS: Bilateral patchy airspace opacities, similar to prior. Similar appearance of a chronic right pleural fluid collection with internal gas. Suspect layering left pleural effusion. No discernible pneumothorax. Cardiac silhouette is enlarged. No acute osseous abnormality. Aortic atherosclerosis. IMPRESSION: 1. Similar bilateral airspace opacities, compatible with multifocal pneumonia. 2. Suspected layering left pleural effusion and similar chronic right pleural effusion with internal gas. Electronically Signed   By: Feliberto Harts MD   On: 09/14/2020 13:30   DG Foot 2 Views Left  Result Date: 09/15/2020 CLINICAL DATA:  Heel wound  for several months, no known injury, initial encounter EXAM: LEFT FOOT - 2 VIEW COMPARISON:  08/09/2020 FINDINGS: Soft tissue wound is noted along the posterior aspect of the calcaneus. Calcaneal spurs are noted. No bony destruction is noted to suggest osteomyelitis. Degenerative changes of the tarsal bones are seen. Mild osteopenia is noted similar to that seen on the prior study. Forefoot soft tissue swelling is noted as well stable from the previous exam. IMPRESSION: Stable soft tissue swelling. No bony  erosive changes to suggest osteomyelitis. Stable heel wound Electronically Signed   By: Alcide Clever M.D.   On: 09/15/2020 16:28   DG Swallowing Func-Speech Pathology  Result Date: 09/23/2020 Objective Swallowing Evaluation: Type of Study: MBS-Modified Barium Swallow Study  Patient Details Name: Jeremiah [name] MRN: [id number] Date of Birth: 12/16/1945 Today's Date: 09/23/2020 Time: SLP Start Time (ACUTE ONLY): 1059 -SLP Stop Time (ACUTE ONLY): 1120 SLP Time Calculation (min) (ACUTE ONLY): 21 min Past Medical History: Past Medical History: Diagnosis Date . Amputee  . Aortic atherosclerosis (HCC)  . Arthritis   "joints; shoulders, knees, hands, back" (05/21/2018) . Atherosclerosis of coronary artery  . C. difficile diarrhea 04/2018 . Diastolic CHF (HCC)  . Diverticulosis  . High cholesterol  . History of gout  . Hypertension  . IDA (iron deficiency anemia)   from referral Dr Darleene Cleaver . Internal hemorrhoids  . Peripheral vascular disease (HCC)  . Pneumonia   "couple times" (05/21/2018) . Sleep apnea   "has mask; won't use" (05/21/2018) . Type II diabetes mellitus (HCC)  Past Surgical History: Past Surgical History: Procedure Laterality Date . ABDOMINAL AORTOGRAM W/LOWER EXTREMITY N/A 11/01/2018  Procedure: ABDOMINAL AORTOGRAM W/LOWER EXTREMITY;  Surgeon: Runell Gess, MD;  Location: [facility];  Service: Cardiovascular;  Laterality: N/A; . ABDOMINAL AORTOGRAM W/LOWER EXTREMITY Left 06/20/2020  Procedure: ABDOMINAL AORTOGRAM W/LOWER EXTREMITY;  Surgeon: Cephus Shelling, MD;  Location: [facility];  Service: Cardiovascular;  Laterality: Left; . AMPUTATION Right 11/09/2018  Procedure: RIGHT - AMPUTATION ABOVE KNEE;  Surgeon: Maeola Harman, MD;  Location: [facility] OR;  Service: Vascular;  Laterality: Right; . CATARACT EXTRACTION W/ INTRAOCULAR LENS  IMPLANT, BILATERAL Bilateral  . CHOLECYSTECTOMY  05/21/2018  ATTEMPTED LAPAROSCOPIC CHOLECYSTECTOMY, OPEN DRAINAGE OF GALLBLADDER WITH BIOPSY .  CHOLECYSTECTOMY N/A 05/21/2018  Procedure: ATTEMPTED LAPAROSCOPIC CHOLECYSTECTOMY, OPEN DRAINAGE OF GALLBLADDER WITH BIOPSY;  Surgeon: Griselda Miner, MD;  Location: [facility] OR;  Service: General;  Laterality: N/A; . COLONOSCOPY W/ POLYPECTOMY   . COLONOSCOPY WITH PROPOFOL N/A 09/16/2018  Procedure: COLONOSCOPY WITH PROPOFOL;  Surgeon: Charlott Rakes, MD;  Location: [facility]OS[facility];  Service: Endoscopy;  Laterality: N/A; . 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 FLEXIBLE SIGMOIDOSCOPY N/A 04/20/2020  Procedure: FLEXIBLE SIGMOIDOSCOPY;  Surgeon: Meridee Score Netty Starring., MD;  Location: [facility];  Service: Gastroenterology;  Laterality: N/A;  Fecal Disimpaction . IR CATHETER TUBE CHANGE  04/14/2018 . IR CHOLANGIOGRAM EXISTING TUBE  03/17/2018 . IR PERC CHOLECYSTOSTOMY  01/31/2018 . IR RADIOLOGIST EVAL & MGMT  03/02/2018 . PERIPHERAL VASCULAR INTERVENTION Left 06/20/2020  Procedure: PERIPHERAL VASCULAR INTERVENTION;  Surgeon: Cephus Shelling, MD;  Location: [facility]VAS[facility] CV LAB;  Service: Cardiovascular;  Laterality: Left;  4 SFA STENTS HPI: 74 yo male resident of [place] with medical history significant of peripheral vascular disease, s/p right AKI, chronic diastolic heart failure, hypertension type 2 diabetes mellitus presented with dyspnea and hypoxia was in hospital  from 08/09/20 to 08/21/20 with COVID 19 and aspiration pneumonia complicated by Rt pleural effusion treated with pig tail catheter drainage.  He was then in hospital from 08/26/20 to 08/30/20 with HCAP.  He presented to ER again on 09/14/20 with dyspnea from HCAP and Rt pleural effusion. Pt evaluated by SLP on 10/23 and found to have no signs of dysphagia. On 10/26 pt developed respiratory distress and cardiac arrest and was intubated until 10/28. Again on 10/29 pt had some respiratory distress. There was concern for aspiration.   Subjective: pleasant, son in room with him, upset he is not able to go to wife's funeral today Assessment / Plan / Recommendation CHL IP CLINICAL IMPRESSIONS 09/23/2020 Clinical Impression Pt presented with pharyngeal dysphagia characterized by mildly reduced anterior laryngeal movement and reduced duration of cricopharyngeal relaxation which resulted in pyriform sinus residue across consistencies.No functional benefit was noted with postural modifications. A liquid wash reduced, but did not eliminate, residue and pt was unable to demonstrate a mendelsohn maneuver. Penetration (PAS 2) was noted with consecutive swallows of thin liquids, but this is considered to be WNL and no instances of aspiration were demonstrated. Pt denied sensation of pharyngeal residue throughout the study. Due to the pt's recent episode of "choking", the potential impact of pyriform sinus residue on safety is considered. A dysphagia 3 diet with thin liquids is recommended at this time with observance of swallowing precautions. SLP will follow for diet tolerance.  SLP Visit Diagnosis Dysphagia, unspecified (R13.10) Attention and concentration deficit following -- Frontal lobe and executive function deficit following -- Impact on safety and function Moderate aspiration risk   CHL IP TREATMENT RECOMMENDATION 09/23/2020 Treatment Recommendations Therapy as outlined in treatment plan below   Prognosis 09/23/2020 Prognosis for Safe Diet Advancement Good Barriers to Reach Goals -- Barriers/Prognosis Comment -- CHL IP DIET RECOMMENDATION 09/23/2020 SLP Diet Recommendations Dysphagia 3 (Mech soft) solids;Thin liquid Liquid Administration via Cup;Straw Medication Administration Whole meds with liquid Compensations Slow rate;Small sips/bites;Follow solids with liquid Postural Changes Seated upright at 90 degrees;Remain semi-upright after after feeds/meals (Comment)   CHL IP OTHER RECOMMENDATIONS 09/23/2020 Recommended Consults -- Oral Care  Recommendations Oral care BID Other Recommendations --   CHL IP FOLLOW UP RECOMMENDATIONS 09/23/2020 Follow up Recommendations Skilled Nursing facility   [facility] IP FREQUENCY AND DURATION 09/23/2020 Speech Therapy Frequency (ACUTE ONLY) min 2x/week Treatment Duration 2 weeks      CHL IP ORAL PHASE 09/23/2020 Oral Phase WFL Oral - Pudding Teaspoon -- Oral - Pudding Cup -- Oral - Honey Teaspoon -- Oral - Honey Cup -- Oral - Nectar Teaspoon -- Oral - Nectar Cup -- Oral - Nectar Straw -- Oral - Thin Teaspoon -- Oral - Thin Cup -- Oral - Thin Straw -- Oral - Puree -- Oral - Mech Soft -- Oral - Regular -- Oral - Multi-Consistency -- Oral - Pill -- Oral Phase - Comment --  CHL IP PHARYNGEAL PHASE 09/23/2020 Pharyngeal Phase Impaired Pharyngeal- Pudding Teaspoon -- Pharyngeal -- Pharyngeal- Pudding Cup -- Pharyngeal -- Pharyngeal- Honey Teaspoon -- Pharyngeal -- Pharyngeal- Honey Cup -- Pharyngeal -- Pharyngeal- Nectar Teaspoon -- Pharyngeal -- Pharyngeal- Nectar Cup -- Pharyngeal -- Pharyngeal- Nectar Straw -- Pharyngeal -- Pharyngeal- Thin Teaspoon -- Pharyngeal -- Pharyngeal- Thin Cup -- Pharyngeal -- Pharyngeal- Thin Straw -- Pharyngeal -- Pharyngeal- Puree -- Pharyngeal -- Pharyngeal- Mechanical Soft -- Pharyngeal -- Pharyngeal- Regular -- Pharyngeal -- Pharyngeal- Multi-consistency -- Pharyngeal -- Pharyngeal- Pill -- Pharyngeal -- Pharyngeal Comment --  CHL IP CERVICAL ESOPHAGEAL  PHASE 09/23/2020 Cervical Esophageal Phase Reduced duration of cricopharyngeal relaxation but otherwise WNL Pudding Teaspoon -- Pudding Cup -- Honey Teaspoon -- Honey Cup -- Nectar Teaspoon -- Nectar Cup -- Nectar Straw -- Thin Teaspoon -- Thin Cup -- Thin Straw -- Puree -- Mechanical Soft -- Regular -- Multi-consistency -- Pill -- Cervical Esophageal Comment -- Shanika I. Vear Clock, MS, CCC-SLP Acute Rehabilitation Services Office number [phone] Pager [phone] Scheryl Marten 09/23/2020, 12:52 PM              IR US CHEST  Result  Date: 09/18/2020 CLINICAL DATA:  Evaluate pleural effusion for thoracentesis. EXAM: CHEST ULTRASOUND COMPARISON:  Chest radiograph 09/17/2020 FINDINGS: Small amount of right pleural fluid that may be slightly loculated. Limited percutaneous window for thoracentesis. IMPRESSION: Small right pleural effusion.  Thoracentesis not performed. Electronically Signed   By: Richarda Overlie M.D.   On: 09/18/2020 09:14   VAS [place] LOWER EXTREMITY VENOUS (DVT)  Result Date: 09/28/2020  Lower Venous DVT Study Indications: Swelling, and Edema.  Risk Factors: None identified. Performing Technologist: Jannet Askew RCT RDMS  Examination Guidelines: A complete evaluation includes B-mode imaging, spectral Doppler, color Doppler, and power Doppler as needed of all accessible portions of each vessel. Bilateral testing is considered an integral part of a complete examination. Limited examinations for reoccurring indications may be performed as noted. The reflux portion of the exam is performed with the patient in reverse Trendelenburg.  +---------+---------------+---------+-----------+----------+--------------+ RIGHT    CompressibilityPhasicitySpontaneityPropertiesThrombus Aging +---------+---------------+---------+-----------+----------+--------------+ CFV      Full           Yes      Yes                                 +---------+---------------+---------+-----------+----------+--------------+ SFJ      Full                                                        +---------+---------------+---------+-----------+----------+--------------+ FV Prox  Full                                                        +---------+---------------+---------+-----------+----------+--------------+ FV Mid   Full                                                        +---------+---------------+---------+-----------+----------+--------------+ FV DistalFull                                                         +---------+---------------+---------+-----------+----------+--------------+ PFV      Full                                                        +---------+---------------+---------+-----------+----------+--------------+  POP      Full           Yes      Yes                                 +---------+---------------+---------+-----------+----------+--------------+ PTV      Full                                                        +---------+---------------+---------+-----------+----------+--------------+ PERO     Full                                                        +---------+---------------+---------+-----------+----------+--------------+   +---------+---------------+---------+-----------+----------+--------------+ LEFT     CompressibilityPhasicitySpontaneityPropertiesThrombus Aging +---------+---------------+---------+-----------+----------+--------------+ CFV      Full           Yes      Yes                                 +---------+---------------+---------+-----------+----------+--------------+ SFJ      Full                                                        +---------+---------------+---------+-----------+----------+--------------+ FV Prox  Full                                                        +---------+---------------+---------+-----------+----------+--------------+ FV Mid   Full                                                        +---------+---------------+---------+-----------+----------+--------------+ FV DistalFull                                                        +---------+---------------+---------+-----------+----------+--------------+ PFV      Full                                                        +---------+---------------+---------+-----------+----------+--------------+ POP      Full           Yes      Yes                                  +---------+---------------+---------+-----------+----------+--------------+  PTV      Full                                                        +---------+---------------+---------+-----------+----------+--------------+ PERO     Full                                                        +---------+---------------+---------+-----------+----------+--------------+     *See table(s) above for measurements and observations. Electronically signed by Lemar Livings MD on 09/28/2020 at 4:04:06 PM.    Final    ECHOCARDIOGRAM LIMITED  Result Date: 09/27/2020    ECHOCARDIOGRAM LIMITED REPORT   Patient Name:   Jeremiah [name] Date of Exam: 09/27/2020 Medical Rec #:  [id number]       Height:       69.0 in Accession #:    [id number]      Weight:       123.5 lb Date of Birth:  01/26/1946       BSA:          1.683 m Patient Age:    74 years        BP:           100/60 mmHg Patient Gender: M               HR:           77 bpm. Exam Location:  Inpatient Procedure: Limited Echo, Limited Color Doppler and Cardiac Doppler STAT ECHO Indications:    Pericardial Effusion I31.3  History:        Patient has prior history of Echocardiogram examinations, most                 recent 09/18/2020. Risk Factors:Diabetes and Hypertension.  Sonographer:    Thurman Coyer RDCS (AE) Referring Phys: [id number] JESSICA MARSHALL IMPRESSIONS  1. Persistent moderate pericardial effusion predominantly around the LV apex. No evidence for a tamponade.  2. Left ventricular ejection fraction, by estimation, is 40 to 45%. The left ventricle has mildly decreased function. The left ventricle demonstrates global hypokinesis.  3. Moderate pericardial effusion. The pericardial effusion is surrounding the apex. There is no evidence of cardiac tamponade.  4. There is severe calcifcation of the aortic valve. There is severe thickening of the aortic valve. Aortic valve regurgitation is moderate. FINDINGS  Left Ventricle: Left ventricular ejection fraction,  by estimation, is 40 to 45%. The left ventricle has mildly decreased function. The left ventricle demonstrates global hypokinesis. Pericardium: A moderately sized pericardial effusion is present. The pericardial effusion is surrounding the apex. There is no evidence of cardiac tamponade. Aortic Valve: There is severe calcifcation of the aortic valve. There is severe thickening of the aortic valve. Aortic valve regurgitation is moderate. Aortic valve mean gradient measures 15.0 mmHg. Aortic valve peak gradient measures 24.2 mmHg. AORTIC VALVE AV Vmax:           246.00 cm/s AV Vmean:          185.000 cm/s AV VTI:            0.467 m AV Peak Grad:      24.2 mmHg AV  Mean Grad:      15.0 mmHg LVOT Vmax:         77.00 cm/s LVOT Vmean:        49.700 cm/s LVOT VTI:          0.153 m LVOT/AV VTI ratio: 0.33  SHUNTS Systemic VTI: 0.15 m Tobias Alexander MD Electronically signed by Tobias Alexander MD Signature Date/Time: 09/27/2020/3:21:31 PM    Final    ECHOCARDIOGRAM LIMITED  Result Date: 09/18/2020    ECHOCARDIOGRAM LIMITED REPORT   Patient Name:   Jeremiah [name] Date of Exam: 09/18/2020 Medical Rec #:  [id number]       Height:       69.0 in Accession #:    [id number]      Weight:       139.0 lb Date of Birth:  [birth year]       BSA:          1.770 m Patient Age:    74 years        BP:           148/61 mmHg Patient Gender: M               HR:           69 bpm. Exam Location:  Inpatient Procedure: Limited Echo, Limited Color Doppler and Cardiac Doppler STAT ECHO Indications:    Cardiac arrest I46.9  History:        Patient has prior history of Echocardiogram examinations, most                 recent 06/19/2020. Risk Factors:Hypertension, Diabetes and                 Dyslipidemia. Pleural effusion.  Sonographer:    Renella Cunas RDCS Referring Phys: [id number] Lorin Glass  Sonographer Comments: Echo performed with patient supine and on artificial respirator. IMPRESSIONS  1. Left ventricular ejection fraction, by estimation, is  40 to 45%. The left ventricle has mildly decreased function. The left ventricle demonstrates regional wall motion abnormalities (see scoring diagram/findings for description). Left ventricular diastolic parameters are consistent with Grade II diastolic dysfunction (pseudonormalization). Elevated left atrial pressure. There is mild hypokinesis of the left ventricular, basal-mid inferior wall and inferolateral wall.  2. Right ventricular systolic function is normal. The right ventricular size is normal. Tricuspid regurgitation signal is inadequate for assessing PA pressure.  3. Moderate pericardial effusion. The pericardial effusion is surrounding the apex. There is no evidence of cardiac tamponade.  4. The mitral valve is normal in structure. Trivial mitral valve regurgitation.  5. The aortic valve is tricuspid. There is mild calcification of the aortic valve. There is mild thickening of the aortic valve. Aortic valve regurgitation is trivial. Moderate aortic valve stenosis.  6. The inferior vena cava is normal in size with <50% respiratory variability, suggesting right atrial pressure of 8 mmHg. Comparison(s): Prior images reviewed side by side. Changes from prior study are noted. The left ventricular function is significantly worse. Aortic valve gradients are lower compared to the previous study due to decreased left ventricular systolic function. Although there is global LV hypokinesis, this appears to be disproportionately worse in the inferior and inferolateral wall. FINDINGS  Left Ventricle: Left ventricular ejection fraction, by estimation, is 40 to 45%. The left ventricle has mildly decreased function. The left ventricle demonstrates regional wall motion abnormalities. Mild hypokinesis of the left ventricular, basal-mid inferior wall and inferolateral wall. The left ventricular internal cavity size  was normal in size. There is no left ventricular hypertrophy. Left ventricular diastolic parameters are  consistent with Grade II diastolic dysfunction (pseudonormalization). Elevated left atrial pressure. Right Ventricle: The right ventricular size is normal. No increase in right ventricular wall thickness. Right ventricular systolic function is normal. Tricuspid regurgitation signal is inadequate for assessing PA pressure. Left Atrium: Left atrial size was normal in size. Right Atrium: Right atrial size was normal in size. Pericardium: A moderately sized pericardial effusion is present. The pericardial effusion is surrounding the apex. There is no evidence of cardiac tamponade. Mitral Valve: The mitral valve is normal in structure. Mild mitral annular calcification. Trivial mitral valve regurgitation. Aortic Valve: The aortic valve is tricuspid. There is mild calcification of the aortic valve. There is mild thickening of the aortic valve. Aortic valve regurgitation is trivial. Moderate aortic stenosis is present. Aortic valve mean gradient measures 16.0 mmHg. Aortic valve peak gradient measures 28.7 mmHg. Aortic valve area, by VTI measures 1.18 cm. Pulmonic Valve: The pulmonic valve was normal in structure. Pulmonic valve regurgitation is not visualized. Aorta: The aortic root and ascending aorta are structurally normal, with no evidence of dilitation. Venous: The inferior vena cava is normal in size with less than 50% respiratory variability, suggesting right atrial pressure of 8 mmHg. IAS/Shunts: No atrial level shunt detected by color flow Doppler. LEFT VENTRICLE PLAX 2D LVIDd:         5.20 cm      Diastology LVIDs:         3.80 cm      LV e' medial:    3.57 cm/s LV PW:         0.80 cm      LV E/e' medial:  26.4 LV IVS:        0.80 cm      LV e' lateral:   3.65 cm/s LVOT diam:     2.10 cm      LV E/e' lateral: 25.8 LV SV:         74 LV SV Index:   42 LVOT Area:     3.46 cm  LV Volumes (MOD) LV vol d, MOD A2C: 132.0 ml LV vol d, MOD A4C: 90.6 ml LV vol s, MOD A2C: 82.0 ml LV vol s, MOD A4C: 51.3 ml LV SV MOD A2C:      50.0 ml LV SV MOD A4C:     90.6 ml LV SV MOD BP:      50.3 ml RIGHT VENTRICLE RV S prime:     10.80 cm/s TAPSE (M-mode): 1.8 cm LEFT ATRIUM         Index LA diam:    3.30 cm 1.86 cm/m  AORTIC VALVE AV Area (Vmax):    1.01 cm AV Area (Vmean):   1.00 cm AV Area (VTI):     1.18 cm AV Vmax:           268.00 cm/s AV Vmean:          188.000 cm/s AV VTI:            0.629 m AV Peak Grad:      28.7 mmHg AV Mean Grad:      16.0 mmHg LVOT Vmax:         78.41 cm/s LVOT Vmean:        54.147 cm/s LVOT VTI:          0.214 m LVOT/AV VTI ratio: 0.34  AORTA Ao Root diam: 2.90 cm MITRAL VALVE  MV Area (PHT): 4.57 cm     SHUNTS MV Decel Time: 166 msec     Systemic VTI:  0.21 m MV E velocity: 94.30 cm/s   Systemic Diam: 2.10 cm MV A velocity: 101.00 cm/s MV E/A ratio:  0.93 Mihai Croitoru MD Electronically signed by Thurmon Fair MD Signature Date/Time: 09/18/2020/2:55:51 PM    Final      Subjective: No acute issues or events overnight, patient's only complaint is 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  Otherwise denies shortness of breath, chest pain, nausea, vomiting, diarrhea, constipation, headache, fevers, chills.   Discharge Exam: Vitals:   10/02/20 1600 10/02/20 1900  BP: (!) 149/80 (!) 158/69  Pulse:  71  Resp:  11  Temp:    SpO2:  97%   Vitals:   10/02/20 1500 10/02/20 1521 10/02/20 1600 10/02/20 1900  BP: (!) 103/50  (!) 149/80 (!) 158/69  Pulse: 76   71  Resp: (!) 29   11  Temp:  98.3 F (36.8 C)    TempSrc:  Oral    SpO2: 99%   97%  Weight:      Height:        General: Pt is alert, awake, not in acute distress Cardiovascular: RRR, S1/S2 +, no rubs, no gallops Respiratory: CTA bilaterally, no wheezing, no rhonchi Abdominal: Soft, NT, ND, bowel sounds + Extremities: no edema, no cyanosis    The results of significant diagnostics from this  hospitalization (including imaging, microbiology, ancillary and laboratory) are listed below for reference.     Microbiology: Recent Results (from the past 240 hour(s))  Respiratory Panel by RT PCR (Flu A&B, Covid) - Nasopharyngeal Swab     Status: None   Collection Time: 09/25/20 10:19 AM   Specimen: Nasopharyngeal Swab  Result Value Ref Range Status   SARS Coronavirus 2 by RT PCR NEGATIVE NEGATIVE Final    Comment: (NOTE) SARS-CoV-2 target nucleic acids are NOT DETECTED.  The SARS-CoV-2 RNA is generally detectable in upper respiratoy specimens during the acute phase of infection. The lowest concentration of SARS-CoV-2 viral copies this assay can detect is 131 copies/mL. A negative result does not preclude SARS-Cov-2 infection and should not be used as the sole basis for treatment or other patient management decisions. A negative result may occur with  improper specimen collection/handling, submission of specimen other than nasopharyngeal swab, presence of viral mutation(s) within the areas targeted by this assay, and inadequate number of viral copies (<131 copies/mL). A negative result must be combined with clinical observations, patient history, and epidemiological information. The expected result is Negative.  Fact Sheet for Patients:  https://www.moore.com/  Fact Sheet for Healthcare Providers:  https://www.young.biz/  This test is no t yet approved or cleared by the [place] FDA and  has been authorized for detection and/or diagnosis of SARS-CoV-2 by FDA under an Emergency Use Authorization (EUA). This EUA will remain  in effect (meaning this test can be used) for the duration of the COVID-19 declaration under Section 564(b)(1) of the Act, 21 U.S.C. section 360bbb-3(b)(1), unless the authorization is terminated or revoked sooner.     Influenza A by PCR NEGATIVE NEGATIVE Final   Influenza B by PCR NEGATIVE NEGATIVE Final     Comment: (NOTE) The Xpert Xpress SARS-CoV-2/FLU/RSV assay is intended as an aid in  the diagnosis of influenza from Nasopharyngeal swab specimens  and  should not be used as a sole basis for treatment. Nasal washings and  aspirates are unacceptable for Xpert Xpress SARS-CoV-2/FLU/RSV  testing.  Fact Sheet for Patients: https://www.moore.com/  Fact Sheet for Healthcare Providers: https://www.young.biz/  This test is not yet approved or cleared by the [place] FDA and  has been authorized for detection and/or diagnosis of SARS-CoV-2 by  FDA under an Emergency Use Authorization (EUA). This EUA will remain  in effect (meaning this test can be used) for the duration of the  Covid-19 declaration under Section 564(b)(1) of the Act, 21  U.S.C. section 360bbb-3(b)(1), unless the authorization is  terminated or revoked. Performed at [facility], [address], [place], [place] [address]   Respiratory Panel by RT PCR (Flu A&B, Covid) - Nasopharyngeal Swab     Status: None   Collection Time: 09/27/20 11:07 AM   Specimen: Nasopharyngeal Swab  Result Value Ref Range Status   SARS Coronavirus 2 by RT PCR NEGATIVE NEGATIVE Final    Comment: (NOTE) SARS-CoV-2 target nucleic acids are NOT DETECTED.  The SARS-CoV-2 RNA is generally detectable in upper respiratoy specimens during the acute phase of infection. The lowest concentration of SARS-CoV-2 viral copies this assay can detect is 131 copies/mL. A negative result does not preclude SARS-Cov-2 infection and should not be used as the sole basis for treatment or other patient management decisions. A negative result may occur with  improper specimen collection/handling, submission of specimen other than nasopharyngeal swab, presence of viral mutation(s) within the areas targeted by this assay, and inadequate number of viral copies (<131 copies/mL). A negative result must be combined with  clinical observations, patient history, and epidemiological information. The expected result is Negative.  Fact Sheet for Patients:  https://www.moore.com/  Fact Sheet for Healthcare Providers:  https://www.young.biz/  This test is no t yet approved or cleared by the [place] FDA and  has been authorized for detection and/or diagnosis of SARS-CoV-2 by FDA under an Emergency Use Authorization (EUA). This EUA will remain  in effect (meaning this test can be used) for the duration of the COVID-19 declaration under Section 564(b)(1) of the Act, 21 U.S.C. section 360bbb-3(b)(1), unless the authorization is terminated or revoked sooner.     Influenza A by PCR NEGATIVE NEGATIVE Final   Influenza B by PCR NEGATIVE NEGATIVE Final    Comment: (NOTE) The Xpert Xpress SARS-CoV-2/FLU/RSV assay is intended as an aid in  the diagnosis of influenza from Nasopharyngeal swab specimens and  should not be used as a sole basis for treatment. Nasal washings and  aspirates are unacceptable for Xpert Xpress SARS-CoV-2/FLU/RSV  testing.  Fact Sheet for Patients: https://www.moore.com/  Fact Sheet for Healthcare Providers: https://www.young.biz/  This test is not yet approved or cleared by the [place] FDA and  has been authorized for detection and/or diagnosis of SARS-CoV-2 by  FDA under an Emergency Use Authorization (EUA). This EUA will remain  in effect (meaning this test can be used) for the duration of the  Covid-19 declaration under Section 564(b)(1) of the Act, 21  U.S.C. section 360bbb-3(b)(1), unless the authorization is  terminated or revoked. Performed at [facility], [address], [place], [place] [address]   Culture, blood (routine x 2)     Status: None   Collection Time: 09/27/20 11:14 AM   Specimen: Site Not Specified; Blood  Result Value Ref Range Status   Specimen Description SITE  NOT SPECIFIED  Final   Special Requests  Final    BOTTLES DRAWN AEROBIC AND ANAEROBIC Blood Culture adequate volume   Culture   Final    NO GROWTH 5 DAYS Performed at [facility], [address] [place], [place] [address]    Report Status 10/02/2020 FINAL  Final  Culture, blood (routine x 2)     Status: None   Collection Time: 09/27/20 11:28 AM   Specimen: BLOOD LEFT WRIST  Result Value Ref Range Status   Specimen Description BLOOD LEFT WRIST  Final   Special Requests   Final    BOTTLES DRAWN AEROBIC AND ANAEROBIC Blood Culture adequate volume   Culture   Final    NO GROWTH 5 DAYS Performed at [facility], [address] [place], [place] [address]    Report Status 10/02/2020 FINAL  Final  MRSA PCR Screening     Status: None   Collection Time: 09/27/20  5:05 PM   Specimen: Nasopharyngeal  Result Value Ref Range Status   MRSA by PCR NEGATIVE NEGATIVE Final    Comment:        The GeneXpert MRSA Assay (FDA approved for NASAL specimens only), is one component of a comprehensive MRSA colonization surveillance program. It is not intended to diagnose MRSA infection nor to guide or monitor treatment for MRSA infections. Performed at [facility], [address], [place], [place] [address]      Labs: BNP (last 3 results) Recent Labs    08/14/20 0859 09/14/20 1445 09/27/20 1107  BNP 284.3* 1,634.6* 963.1*   Basic Metabolic Panel: Recent Labs  Lab 09/28/20 0254 09/28/20 1655 09/29/20 0020 09/29/20 1810 09/30/20 0548 10/01/20 0141 10/02/20 0124  NA 140  --  140  --  144 143 141  K 3.6  --  3.4*  --  3.1* 3.7 4.0  CL 102  --  104  --  103 105 104  CO2 27  --  24  --  27 27 27   GLUCOSE 132*  --  214*  --  133* 172* 119*  BUN 33*  --  44*  --  31* 26* 22  CREATININE 1.83*  --  1.74*  --  1.20 1.13 1.06  CALCIUM 8.2*  --  8.1*  --  8.5* 8.6* 8.6*  MG  --  2.0 1.9 1.8 1.7  --   --   PHOS  --  2.8 1.5* 5.1* 3.1  --   --    Liver Function  Tests: Recent Labs  Lab 09/27/20 1107  AST 17  ALT 26  ALKPHOS 137*  BILITOT 0.2*  PROT 8.2*  ALBUMIN 2.2*   No results for input(s): LIPASE, AMYLASE in the last 168 hours. No results for input(s): AMMONIA in the last 168 hours. CBC: Recent Labs  Lab 09/27/20 1107 09/27/20 1146 09/28/20 0254 09/29/20 0020 09/30/20 0548 10/01/20 0141 10/02/20 0124  WBC 26.1*   < > 21.6* 12.4* 13.3* 13.0* 11.6*  NEUTROABS 23.5*  --   --   --   --   --   --   HGB 11.4*   < > 8.9* 8.3* 9.4* 9.5* 9.3*  HCT 38.0*   < > 27.8* 26.1* 29.5* 30.3* 29.5*  MCV 92.2   < > 84.8 86.1 87.0 85.6 86.0  PLT 389   < > 250 210 254 247 245   < > = values in this interval not displayed.   Cardiac Enzymes: No results for input(s): CKTOTAL, CKMB, CKMBINDEX, TROPONINI in the  last 168 hours. BNP: Invalid input(s): POCBNP CBG: Recent Labs  Lab 10/02/20 0311 10/02/20 0727 10/02/20 1139 10/02/20 1518 10/02/20 1911  GLUCAP 104* 128* 245* 133* 75   D-Dimer No results for input(s): DDIMER in the last 72 hours. Hgb A1c No results for input(s): HGBA1C in the last 72 hours. Lipid Profile No results for input(s): CHOL, HDL, LDLCALC, TRIG, CHOLHDL, LDLDIRECT in the last 72 hours. Thyroid function studies No results for input(s): TSH, T4TOTAL, T3FREE, THYROIDAB in the last 72 hours.  Invalid input(s): FREET3 Anemia work up No results for input(s): VITAMINB12, FOLATE, FERRITIN, TIBC, IRON, RETICCTPCT in the last 72 hours. Urinalysis    Component Value Date/Time   COLORURINE YELLOW 09/27/2020 1215   APPEARANCEUR CLOUDY (A) 09/27/2020 1215   LABSPEC 1.017 09/27/2020 1215   PHURINE 5.0 09/27/2020 1215   GLUCOSEU NEGATIVE 09/27/2020 1215   HGBUR NEGATIVE 09/27/2020 1215   BILIRUBINUR NEGATIVE 09/27/2020 1215   KETONESUR NEGATIVE 09/27/2020 1215   PROTEINUR 100 (A) 09/27/2020 1215   UROBILINOGEN 0.2 01/09/2009 1532   NITRITE NEGATIVE 09/27/2020 1215   LEUKOCYTESUR TRACE (A) 09/27/2020 1215   Sepsis  Labs Invalid input(s): PROCALCITONIN,  WBC,  LACTICIDVEN Microbiology Recent Results (from the past 240 hour(s))  Respiratory Panel by RT PCR (Flu A&B, Covid) - Nasopharyngeal Swab     Status: None   Collection Time: 09/25/20 10:19 AM   Specimen: Nasopharyngeal Swab  Result Value Ref Range Status   SARS Coronavirus 2 by RT PCR NEGATIVE NEGATIVE Final    Comment: (NOTE) SARS-CoV-2 target nucleic acids are NOT DETECTED.  The SARS-CoV-2 RNA is generally detectable in upper respiratoy specimens during the acute phase of infection. The lowest concentration of SARS-CoV-2 viral copies this assay can detect is 131 copies/mL. A negative result does not preclude SARS-Cov-2 infection and should not be used as the sole basis for treatment or other patient management decisions. A negative result may occur with  improper specimen collection/handling, submission of specimen other than nasopharyngeal swab, presence of viral mutation(s) within the areas targeted by this assay, and inadequate number of viral copies (<131 copies/mL). A negative result must be combined with clinical observations, patient history, and epidemiological information. The expected result is Negative.  Fact Sheet for Patients:  https://www.moore.com/https://www.fda.gov/media/142436/download  Fact Sheet for Healthcare Providers:  https://www.young.biz/https://www.fda.gov/media/142435/download  This test is no t yet approved or cleared by the [place] FDA and  has been authorized for detection and/or diagnosis of SARS-CoV-2 by FDA under an Emergency Use Authorization (EUA). This EUA will remain  in effect (meaning this test can be used) for the duration of the COVID-19 declaration under Section 564(b)(1) of the Act, 21 U.S.C. section 360bbb-3(b)(1), unless the authorization is terminated or revoked sooner.     Influenza A by PCR NEGATIVE NEGATIVE Final   Influenza B by PCR NEGATIVE NEGATIVE Final    Comment: (NOTE) The Xpert Xpress SARS-CoV-2/FLU/RSV assay  is intended as an aid in  the diagnosis of influenza from Nasopharyngeal swab specimens and  should not be used as a sole basis for treatment. Nasal washings and  aspirates are unacceptable for Xpert Xpress SARS-CoV-2/FLU/RSV  testing.  Fact Sheet for Patients: https://www.moore.com/https://www.fda.gov/media/142436/download  Fact Sheet for Healthcare Providers: https://www.young.biz/https://www.fda.gov/media/142435/download  This test is not yet approved or cleared by the [place] FDA and  has been authorized for detection and/or diagnosis of SARS-CoV-2 by  FDA under an Emergency Use Authorization (EUA). This EUA will remain  in effect (meaning this test can be used) for the  duration of the  Covid-19 declaration under Section 564(b)(1) of the Act, 21  U.S.C. section 360bbb-3(b)(1), unless the authorization is  terminated or revoked. Performed at [facility], [address], [place], [place] [address]   Respiratory Panel by RT PCR (Flu A&B, Covid) - Nasopharyngeal Swab     Status: None   Collection Time: 09/27/20 11:07 AM   Specimen: Nasopharyngeal Swab  Result Value Ref Range Status   SARS Coronavirus 2 by RT PCR NEGATIVE NEGATIVE Final    Comment: (NOTE) SARS-CoV-2 target nucleic acids are NOT DETECTED.  The SARS-CoV-2 RNA is generally detectable in upper respiratoy specimens during the acute phase of infection. The lowest concentration of SARS-CoV-2 viral copies this assay can detect is 131 copies/mL. A negative result does not preclude SARS-Cov-2 infection and should not be used as the sole basis for treatment or other patient management decisions. A negative result may occur with  improper specimen collection/handling, submission of specimen other than nasopharyngeal swab, presence of viral mutation(s) within the areas targeted by this assay, and inadequate number of viral copies (<131 copies/mL). A negative result must be combined with clinical observations, patient history, and epidemiological  information. The expected result is Negative.  Fact Sheet for Patients:  https://www.moore.com/  Fact Sheet for Healthcare Providers:  https://www.young.biz/  This test is no t yet approved or cleared by the [place] FDA and  has been authorized for detection and/or diagnosis of SARS-CoV-2 by FDA under an Emergency Use Authorization (EUA). This EUA will remain  in effect (meaning this test can be used) for the duration of the COVID-19 declaration under Section 564(b)(1) of the Act, 21 U.S.C. section 360bbb-3(b)(1), unless the authorization is terminated or revoked sooner.     Influenza A by PCR NEGATIVE NEGATIVE Final   Influenza B by PCR NEGATIVE NEGATIVE Final    Comment: (NOTE) The Xpert Xpress SARS-CoV-2/FLU/RSV assay is intended as an aid in  the diagnosis of influenza from Nasopharyngeal swab specimens and  should not be used as a sole basis for treatment. Nasal washings and  aspirates are unacceptable for Xpert Xpress SARS-CoV-2/FLU/RSV  testing.  Fact Sheet for Patients: https://www.moore.com/  Fact Sheet for Healthcare Providers: https://www.young.biz/  This test is not yet approved or cleared by the [place] FDA and  has been authorized for detection and/or diagnosis of SARS-CoV-2 by  FDA under an Emergency Use Authorization (EUA). This EUA will remain  in effect (meaning this test can be used) for the duration of the  Covid-19 declaration under Section 564(b)(1) of the Act, 21  U.S.C. section 360bbb-3(b)(1), unless the authorization is  terminated or revoked. Performed at [facility], [address] [place], [place] [address]   Culture, blood (routine x 2)     Status: None   Collection Time: 09/27/20 11:14 AM   Specimen: Site Not Specified; Blood  Result Value Ref Range Status   Specimen Description SITE NOT SPECIFIED  Final   Special Requests   Final    BOTTLES  DRAWN AEROBIC AND ANAEROBIC Blood Culture adequate volume   Culture   Final    NO GROWTH 5 DAYS Performed at [facility], [address] [place], [place] [address]    Report Status 10/02/2020 FINAL  Final  Culture, blood (routine x 2)     Status: None   Collection Time: 09/27/20 11:28 AM   Specimen: BLOOD LEFT WRIST  Result Value Ref Range Status   Specimen Description BLOOD LEFT  WRIST  Final   Special Requests   Final    BOTTLES DRAWN AEROBIC AND ANAEROBIC Blood Culture adequate volume   Culture   Final    NO GROWTH 5 DAYS Performed at [facility], [address], [place], [place] [address]    Report Status 10/02/2020 FINAL  Final  MRSA PCR Screening     Status: None   Collection Time: 09/27/20  5:05 PM   Specimen: Nasopharyngeal  Result Value Ref Range Status   MRSA by PCR NEGATIVE NEGATIVE Final    Comment:        The GeneXpert MRSA Assay (FDA approved for NASAL specimens only), is one component of a comprehensive MRSA colonization surveillance program. It is not intended to diagnose MRSA infection nor to guide or monitor treatment for MRSA infections. Performed at [facility], [address], [place], [place] [address]      Time coordinating discharge: Over 30 minutes  SIGNED:   Azucena Fallen, DO [facility] 10/02/2020, 7:47 PM Pager   If 7PM-7AM, please contact night-coverage [email or web address]

## 2020-10-02 NOTE — Evaluation (Signed)
Physical Therapy Evaluation Patient Details Name: Jeremiah [name] MRN: [id number] DOB: [birth year] Today's Date: 10/02/2020   History of Present Illness  Pt is a 74 y/o male with PMH of chronic systolic diastolic HF, HTN, T2DM, R AKA, chronic loculated pleural effusion, PVD, cardiac arrest, repeated hospitalizations presenting from [facility] due to unresponsive and intubated state. Found in acute hypoxic respiratory failure, extubated 11/6, sepsis likely to aspiration.   Clinical Impression  Pt admitted with above diagnosis and presents to PT with functional limitations due to deficits listed below (See PT problem list). Pt needs skilled PT to maximize independence and safety to allow discharge back to SNF for further therapy.      Follow Up Recommendations SNF;Supervision/Assistance - 24 hour    Equipment Recommendations  None recommended by PT    Recommendations for Other Services       Precautions / Restrictions Precautions Precautions: Fall Precaution Comments: Rt AKA      Mobility  Bed Mobility Overal bed mobility: Needs Assistance Bed Mobility: Supine to Sit     Supine to sit: Mod assist     General bed mobility comments: Assist to bring LE's off EOB, elevate trunk into sitting and bring hips to EOB.     Transfers Overall transfer level: Needs assistance Equipment used: None Transfers: Lateral/Scoot Transfers          Lateral/Scoot Transfers: +2 physical assistance;Max assist General transfer comment: Used bed pad to scoot hips from bed to drop arm recliner. Pt able to assist some with UE's. Verbal cues for hand placement and progression.   Ambulation/Gait                Stairs            Wheelchair Mobility    Modified Rankin (Stroke Patients Only)       Balance Overall balance assessment: Needs assistance Sitting-balance support: Bilateral upper extremity supported;Feet supported Sitting balance-Leahy Scale: Poor Sitting balance -  Comments: Pt sat EOB with UE support and min assist primarily with occasional mod assist with loss of balance to the lt.  Postural control: Left lateral lean                                   Pertinent Vitals/Pain Pain Assessment: No/denies pain    Home Living Family/patient expects to be discharged to:: Skilled nursing facility                 Additional Comments: from [facility]    Prior Function Level of Independence: Needs assistance   Gait / Transfers Assistance Needed: Assist with scoot transfer to w/c. Non ambulatory since 01/2020.   ADL's / Homemaking Assistance Needed: Assist with bathing and dressing. Reports he doesn't get up to commode        Hand Dominance   Dominant Hand: Right    Extremity/Trunk Assessment   Upper Extremity Assessment Upper Extremity Assessment: Defer to OT evaluation    Lower Extremity Assessment Lower Extremity Assessment: RLE deficits/detail;LLE deficits/detail RLE Deficits / Details: AKA. Strength <3/5 LLE Deficits / Details: Strength 2+/5       Communication   Communication: No difficulties  Cognition Arousal/Alertness: Awake/alert Behavior During Therapy: Flat affect Overall Cognitive Status: No family/caregiver present to determine baseline cognitive functioning Area of Impairment: Following commands;Problem solving;Memory;Orientation                 Orientation Level: Disoriented  to;Situation   Memory: Decreased short-term memory Following Commands: Follows one step commands with increased time     Problem Solving: Slow processing;Decreased initiation;Requires verbal cues        General Comments      Exercises     Assessment/Plan    PT Assessment Patient needs continued PT services  PT Problem List Decreased strength;Decreased activity tolerance;Decreased balance;Decreased mobility       PT Treatment Interventions Functional mobility training;Therapeutic activities;Therapeutic  exercise;DME instruction;Balance training;Patient/family education;Wheelchair mobility training    PT Goals (Current goals can be found in the Care Plan section)  Acute Rehab PT Goals Patient Stated Goal: not stated PT Goal Formulation: With patient Time For Goal Achievement: 10/16/20 Potential to Achieve Goals: Fair    Frequency Min 2X/week   Barriers to discharge        Co-evaluation PT/OT/SLP Co-Evaluation/Treatment: Yes Reason for Co-Treatment: For patient/therapist safety           AM-PAC PT "6 Clicks" Mobility  Outcome Measure Help needed turning from your back to your side while in a flat bed without using bedrails?: A Lot Help needed moving from lying on your back to sitting on the side of a flat bed without using bedrails?: A Lot Help needed moving to and from a bed to a chair (including a wheelchair)?: Total Help needed standing up from a chair using your arms (e.g., wheelchair or bedside chair)?: Total Help needed to walk in hospital room?: Total Help needed climbing 3-5 steps with a railing? : Total 6 Click Score: 8    End of Session   Activity Tolerance: Patient tolerated treatment well Patient left: in chair;with call bell/phone within reach;with chair alarm set Nurse Communication: Mobility status;Need for lift equipment PT Visit Diagnosis: Other abnormalities of gait and mobility (R26.89);Muscle weakness (generalized) (M62.81)    Time: 5784-6962 PT Time Calculation (min) (ACUTE ONLY): 25 min   Charges:   PT Evaluation $PT Eval Moderate Complexity: 1 Mod          [facility] PT Acute Rehabilitation Services Pager [phone] Office [phone]   Angelina Ok [facility] 10/02/2020, 9:40 AM

## 2020-10-02 NOTE — Progress Notes (Addendum)
1:45pm: CSW spoke with [place] at [facility] - patient requires a new insurance authorization for his return to the facility.   CSW initiated authorization from [facility].  11am: CSW spoke with patient at bedside - he is agreeable to return to [facility] at discharge. Patient reports he is satisified with the care he is receiving at the facility.  Edwin Dada, MSW, LCSW-A Transitions of Care  Clinical Social Worker  [facility] Emergency Departments  Medical ICU [phone]

## 2020-10-02 NOTE — Progress Notes (Signed)
Inpatient Diabetes Program Recommendations  AACE/ADA: New Consensus Statement on Inpatient Glycemic Control (2015)  Target Ranges:  Prepandial:   less than 140 mg/dL      Peak postprandial:   less than 180 mg/dL (1-2 hours)      Critically ill patients:  140 - 180 mg/dL   Lab Results  Component Value Date   GLUCAP 128 (H) 10/02/2020   HGBA1C 6.0 (H) 08/10/2020    Review of Glycemic Control Results for Jeremiah [name], Jeremiah [name] (MRN [id number]) as of 10/02/2020 11:26  Ref. Range 10/01/2020 15:22 10/01/2020 19:19 10/01/2020 23:20 10/02/2020 03:11 10/02/2020 07:27  Glucose-Capillary Latest Ref Range: 70 - 99 mg/dL 793 (H) 903 (H) 59 (L) 104 (H) 128 (H)   Diabetes history: [name] Outpatient Diabetes medications:  Amaryl 1 mg daily Current orders for Inpatient glycemic control:  Novolog moderate q 4 hours Inpatient Diabetes Program Recommendations:   Please consider reducing Novolog correction to tid with meals and HS scale.   Thanks  Beryl Meager, RN, BC-ADM Inpatient Diabetes Coordinator Pager [phone] (8a-5p)

## 2020-10-02 NOTE — Progress Notes (Signed)
Patient refused CPAP.

## 2020-10-02 NOTE — Progress Notes (Signed)
Patient refuses CPAP; patient is non-compliant at home.

## 2020-10-03 DIAGNOSIS — E119 Type 2 diabetes mellitus without complications: Secondary | ICD-10-CM | POA: Diagnosis not present

## 2020-10-03 DIAGNOSIS — J9601 Acute respiratory failure with hypoxia: Secondary | ICD-10-CM | POA: Diagnosis not present

## 2020-10-03 DIAGNOSIS — J690 Pneumonitis due to inhalation of food and vomit: Secondary | ICD-10-CM | POA: Diagnosis not present

## 2020-10-03 DIAGNOSIS — I700 Atherosclerosis of aorta: Secondary | ICD-10-CM

## 2020-10-03 LAB — GLUCOSE, CAPILLARY
Glucose-Capillary: 129 mg/dL — ABNORMAL HIGH (ref 70–99)
Glucose-Capillary: 116 mg/dL — ABNORMAL HIGH (ref 70–99)
Glucose-Capillary: 222 mg/dL — ABNORMAL HIGH (ref 70–99)
Glucose-Capillary: 136 mg/dL — ABNORMAL HIGH (ref 70–99)
Glucose-Capillary: 56 mg/dL — ABNORMAL LOW (ref 70–99)
Glucose-Capillary: 96 mg/dL (ref 70–99)
Glucose-Capillary: 126 mg/dL — ABNORMAL HIGH (ref 70–99)

## 2020-10-03 MED ORDER — INSULIN ASPART 100 UNIT/ML ~~LOC~~ SOLN
0.0000 [IU] | Freq: Three times a day (TID) | SUBCUTANEOUS | Status: DC
  Administered 2020-10-03: 2 [IU] via SUBCUTANEOUS
  Administered 2020-10-03: 5 [IU] via SUBCUTANEOUS
  Administered 2020-10-04: 3 [IU] via SUBCUTANEOUS

## 2020-10-03 MED ORDER — PHENOL 1.4 % MT LIQD
1.0000 | OROMUCOSAL | Status: DC | PRN
  Administered 2020-10-03: 1 via OROMUCOSAL
  Filled 2020-10-03: qty 177

## 2020-10-03 NOTE — Progress Notes (Addendum)
PROGRESS NOTE  AKSHAT MINEHART YWV:[id number] DOB: 1946/07/05 DOA: 09/27/2020 PCP: Annita Brod, MD  Brief History   74 year old man complicated PMH presented from [facility] unresponsive after being found hypoxic in the field.  Intubated in ED and admitted by [facility] for acute hypoxic respiratory failure in the setting of likely aspiration versus healthcare associated pneumonia.  Successfully extubated November 6 to room air.  Completed antibiotics November 8.  Noncompliant with CPAP.  S/p Multiple hospitalizations  September COVID-19, aspiration pneumonia complicated by right pleural effusion requiring pigtail catheter drainage  Early October Healthcare associated pneumonia  Late October presented with shortness of breath from pneumonia and right pleural effusion, respiratory distress led to cardiac arrest October 26.  Noted to have chronic right pleural effusion, trapped lungs, previously treated with pleural thrombolytics, thought to be secondary to recurrent pneumonitis.  Chest tube was removed October 27.  A & P   Acute hypoxic respiratory failure, acute on chronic hypercapnic respiratory failure, secondary to sepsis, aspiration pneumonia.  Complicated by underlying OSA, intolerant to CPAP.   --resolved, extubated 11/6.  Completed antibiotics 11/8. Remains on RA. 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 (Active)  08/27/20 1033  Location: Heel  Location Orientation: Left  Staging: Unstageable - Full thickness tissue loss in which the base of the injury is covered by slough (yellow, tan, gray, green or brown) and/or eschar (tan, brown or black) in the wound bed.  Wound Description (Comments):   Present on Admission: Yes     Pressure Injury 09/18/20 Buttocks Left Stage 2 -  Partial thickness loss of dermis presenting as a shallow open injury with a red, pink wound bed without slough. (Active)  09/18/20 1000  Location: Buttocks  Location Orientation: Left  Staging: Stage 2 -  Partial thickness loss of dermis presenting as a shallow open injury with a red, pink wound bed without slough.  Wound Description (Comments):   Present on Admission:   --Dressing procedure/placement/frequency: Apply Iodine from the swab pads or swab sticks in clean utility, to the left heel wound. Float heel off of mattress. Sacral prophylactic foam dressing in place to buttocks.  Foam dressing in place to left heel. Monitor the wound area(s) for worsening of condition such as: Signs/symptoms of infection,  Increase in size,  Development of or worsening of odor, Development of pain, or increased pain at the affected locations.  Disposition Plan:  Discussion: remains stable for discharge; patient does not have a safe discharge plan in place. PT/OT have rec'd SNF, he needs assistance with transfers, wound care.   Status is: Inpatient  Remains inpatient appropriate because: awaiting authorization for SNF  Dispo: The patient is from: SNF               Anticipated d/c is to: SNF              Anticipated d/c date is: today if authorization received              Patient currently is medically stable to d/c.  DVT prophylaxis: heparin injection 5,000 Units Start: 09/27/20 1400   Code Status: Full Code Family Communication: none  Brendia Sacks, MD  [facility] Direct contact: see [email or web address] (further directions at bottom of note if needed) 7PM-7AM contact night coverage as at bottom of note 10/03/2020, 11:07 AM  LOS: 6 days   Significant Hospital Events   Intubated 11/4 Extubated 11/6  Consults:  PCCM  Procedures:  ett 11/4 > 11/6  Significant Diagnostic Tests:  10/26 echo: LVEF 40-45, grade II diastolic dysfunction, hypokinesis, moderate pericardial effusion without evidence of tamponade  11/4 cth: Atrophy with small vessel chronic ischemic changes of deep cerebral white matter.  No acute intracranial abnormalities.  Micro Data:  11/4 sars/flu: negative 11/4 blood: NG   Antimicrobials:  Cefepime 11/4> 11/9 vanc 11/4 - 11/5 Flagyl 11/4>11/5   Interval History/Subjective  CC: f/u SOB  Feels fine, breathing fine. Chronic mild swallowing difficulty, no change.  Objective   Vitals:  Vitals:   10/03/20 0900 10/03/20 1000  BP: (!) 158/73 129/72  Pulse: 76 81  Resp: (!) 21 (!) 29  Temp:    SpO2: 100% 97%    Exam:  Physical Exam Constitutional:      General: He is not in acute distress.    Appearance: Normal appearance. He is not ill-appearing or toxic-appearing.  Cardiovascular:     Rate and Rhythm: Normal rate and regular rhythm.     Heart sounds: No murmur heard.  No friction rub. No gallop.   Pulmonary:     Effort: Pulmonary effort is normal. No respiratory distress.     Breath sounds: No wheezing or rales.  Abdominal:     General: There is no distension.     Palpations: Abdomen is soft.     Tenderness: There is no abdominal tenderness.  Musculoskeletal:        General: No swelling.       Right lower leg: No edema.     Left lower leg: No edema.     Comments: Right AKA noted  Neurological:     Mental Status: He is alert and oriented to person, place, and time.  Psychiatric:        Mood and Affect: Mood normal.        Behavior: Behavior normal.      I have personally reviewed the following:   Today's Data  . CBG stable . No other labs  Scheduled Meds: . aspirin  81 mg Oral Daily  . brimonidine  1 drop Both Eyes BID  . Chlorhexidine Gluconate Cloth  6 each Topical Daily  . clopidogrel  75 mg Oral Daily  . feeding supplement  237 mL Oral TID BM  . heparin  5,000 Units Subcutaneous Q8H  . insulin aspart  0-15 Units Subcutaneous TID WC  . mouth rinse  15 mL Mouth Rinse BID  . timolol  1 drop Both Eyes BID   Continuous Infusions: . sodium chloride Stopped (10/01/20 1137)  . sodium chloride Stopped (09/27/20 1140)    Active Problems:   Acute hypoxemic respiratory failure (HCC)   Acute respiratory failure (HCC)  Aortic atherosclerosis (HCC)   LOS: 6 days   How to contact the [facility] Attending or Consulting provider 7A - 7P or covering provider during after hours 7P -7A, for this patient?  1. Check the care team in [facility] and look for a) attending/consulting [facility] provider listed and b) the [facility] team listed 2. Log into [email or web address] and use [facility]'s universal password to access. If you do not have the password, please contact the hospital operator. 3. Locate the [facility] provider you are looking for under [facility] and page to a number that you can be directly reached. 4. If you still have difficulty reaching the provider, please page the [facility] (Director on Call) for the Hospitalists listed on amion for assistance.

## 2020-10-03 NOTE — Hospital Course (Addendum)
74 year old man complicated PMH presented from [facility] unresponsive after being found hypoxic in the field.  Intubated in ED and admitted by [facility] for acute hypoxic respiratory failure in the setting of likely aspiration versus healthcare associated pneumonia.  Successfully extubated November 6 to room air.  Completed antibiotics November 8.  Noncompliant with CPAP.

## 2020-10-03 NOTE — Progress Notes (Addendum)
2pm: CSW received call from [facility] stating that a peer to peer is required for this patient. The number to call is [phone],option 5. The peer to peer must be completed before 9:30am tomorrow, 10/04/20.  8:45am: CSW reviewed [employer] portal - insurance authorization is still pending.  Edwin Dada, MSW, LCSW-A Transitions of Care  Clinical Social Worker  [facility] Emergency Departments  Medical ICU [phone]

## 2020-10-04 DIAGNOSIS — J9601 Acute respiratory failure with hypoxia: Secondary | ICD-10-CM | POA: Diagnosis not present

## 2020-10-04 LAB — GLUCOSE, CAPILLARY
Glucose-Capillary: 155 mg/dL — ABNORMAL HIGH (ref 70–99)
Glucose-Capillary: 95 mg/dL (ref 70–99)

## 2020-10-04 MED ORDER — INSULIN ASPART 100 UNIT/ML ~~LOC~~ SOLN: 0.0000 [IU] | Freq: Three times a day (TID) | SUBCUTANEOUS | Status: DC

## 2020-10-04 NOTE — Discharge Summary (Addendum)
Physician Discharge Summary   REA RESER ZOX:[id number] DOB: 04-22-1946 DOA: 09/27/2020  PCP: Annita Brod, MD  Admit date: 09/27/2020 Discharge date: 10/04/2020  Admitted From: SNF Disposition:  SNF Discharging physician: Lewie Chamber, MD  Recommendations for Outpatient Follow-up:  1. May need SSI adjustment for diabetes    Patient discharged to [facility] in Discharge Condition: fair CODE STATUS: Full Diet recommendation:  Diet Orders (From admission, onward)    Start     Ordered   10/02/20 0000  Diet general       Comments: DIET DYS 3 ; Fluid consistency: Thin   10/02/20 0726   09/30/20 1131  DIET DYS 3 Room service appropriate? Yes; Fluid consistency: Thin  Diet effective now       Question Answer Comment  Room service appropriate? Yes   Fluid consistency: Thin      09/30/20 1131          Hospital Course: 74 yo with pmh chronic /systolic diastolic HF, htn, hyperlipidemia, T2DM, CKD3b, R AKA, chronic loculated pleural effusion (not decortication candidate), pvd, cardiac arrest, repeated hospitalizations who presented today to [facility] via EMS from [facility]. All history is obtained from chart and ED records 2/2 pt's unresponsive and intubated state. Pt was just discharged to this facility on 11/2. He was found this morning unresponsive with sats in the 60's. He was placed on face mask at 10L which improved sats to 88% and pt began to arouse. Unfortunately oxygen requirement escalated to NRB and sats continued to decline and he became unresponsive again and ultimately required intubation. Upon evaluation pt was found to have leukocytosis to 26, acute on chronic renal failure.  Patient admitted as above with acute hypoxic respiratory failure in the setting of likely aspiration versus HCAP pneumonia initially requiring intubation, patient successfully extubated on 09/29/2020 to room air, completed antibiotics on 10/01/2020 is otherwise stable and agreeable for  discharge back to facility.  Lengthy discussion with patient as well as family about need for medication compliance, patient is diagnosed with sleep apnea previously but has not worn a CPAP mask in quite some time, if ever, will attempt to set up patient for outpatient CPAP at discharge although patient continues to decline use of CPAP in house unclear if he will be compliant outpatient despite our lengthy discussion.  Patient's other chronic comorbid conditions including CKD 3B normocytic anemia hypertension diabetes appear to be moderately well controlled.  S/p Multiple hospitalizations  September COVID-19, aspiration pneumonia complicated by right pleural effusion requiring pigtail catheter drainage  Early October Healthcare associated pneumonia  Late October presented with shortness of breath from pneumonia and right pleural effusion, respiratory distress led to cardiac arrest October 26.  Noted to have chronic right pleural effusion, trapped lungs, previously treated with pleural thrombolytics, thought to be secondary to recurrent pneumonitis.  Chest tube was removed October 27.  A & P   Acute hypoxic respiratory failure, acute on chronic hypercapnic respiratory failure, secondary to sepsis, aspiration pneumonia.  Complicated by underlying OSA, intolerant to CPAP.   --resolved, extubated 11/6.  Completed antibiotics 11/8. Remains on RA. 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 (Active)  08/27/20 1033  Location: Heel  Location Orientation: Left  Staging: Unstageable - Full thickness tissue loss in which the base of the injury is covered by slough (yellow, tan, gray, green or brown) and/or eschar (tan, brown or black) in the wound bed.  Wound Description (Comments):   Present on Admission: Yes     Pressure Injury 09/18/20 Buttocks Left Stage 2 -  Partial thickness loss of dermis presenting as a shallow open injury with a red, pink wound bed without slough. (Active)  09/18/20 1000  Location: Buttocks  Location Orientation: Left  Staging: Stage 2 -  Partial thickness loss of dermis presenting as a shallow open injury with a red, pink wound bed without slough.  Wound Description (Comments):   Present on Admission:   --Dressing procedure/placement/frequency:Apply Iodine from the swab pads or swab sticks in clean utility, to the left heel wound. Float heel off of mattress. Sacral prophylactic foam dressing in place to buttocks. Foam dressing in place to left heel. Monitor the wound area(s) for worsening of condition such as: Signs/symptoms of infection,  Increase in size,  Development of or worsening of odor, Development of pain, or increased pain at the affected locations.  Principal Diagnosis: Acute hypoxemic respiratory failure [facility])  Discharge Diagnoses: Active Hospital Problems   Diagnosis Date Noted  . Acute hypoxemic respiratory failure (HCC) 09/27/2020  . Severe sepsis (HCC) 10/15/2020  . Aortic  atherosclerosis (HCC) 10/03/2020  . Acute respiratory failure (HCC) 09/27/2020    Resolved Hospital Problems  No resolved problems to display.    Discharge Instructions    Call MD for:  difficulty breathing, headache or visual disturbances   Complete by: As directed    Call MD for:  temperature >100.4   Complete by: As directed    Diet general   Complete by: As directed    DIET DYS 3 ; Fluid consistency: Thin   Discharge wound care:   Complete by: As directed    Apply Iodine from the swab pads or swab sticks in clean utility, to the left heel wound. Float heel off of mattress   Discharge wound care:   Complete by: As directed    Needs ongoing wound care to sacral ulcer (foam dressing change BID). Left heel wound treated with iodine swab sticks to clean wound then keep heel floated off mattress   Increase activity slowly   Complete by: As directed    Increase activity slowly   Complete by: As directed      Allergies as of 10/04/2020   No Known Allergies     Medication List    TAKE these medications   aspirin 81 MG tablet Take 81 mg by mouth daily.   atorvastatin 20 MG tablet Commonly known as: LIPITOR Take 1 tablet (20 mg total) by mouth daily. What changed: when to take this   brimonidine 0.2 % ophthalmic solution Commonly known as: ALPHAGAN Place 1 drop into both eyes 2 (two) times  daily.   clopidogrel 75 MG tablet Commonly known as: PLAVIX Take 1 tablet (75 mg total) by mouth daily with breakfast.   ferrous sulfate 325 (65 FE) MG tablet Take 1 tablet (325 mg total) by mouth 2 (two) times daily with a meal.   gabapentin 300 MG capsule Commonly known as: NEURONTIN Take 1 capsule (300 mg total) by mouth 2 (two) times daily.   glimepiride 1 MG tablet Commonly known as: AMARYL Take 1 tablet (1 mg total) by mouth daily with breakfast.   glucose blood test strip Accu-Chek Aviva Plus test strips  Take 1 strip 3 times a day by miscell. route for 90 days.     glucose blood test strip Accu-Chek Aviva Plus test strips  USE AS DIRECTED THREE TIMES DAILY.   hydrALAZINE 100 MG tablet Commonly known as: APRESOLINE Take 0.5 tablets (50 mg total) by mouth 2 (two) times daily.   metoprolol succinate 25 MG 24 hr tablet Commonly known as: TOPROL-XL Take 1 tablet (25 mg total) by mouth daily.   pantoprazole 40 MG tablet Commonly known as: PROTONIX Take 1 tablet (40 mg total) by mouth daily.   psyllium 58.6 % powder Commonly known as: METAMUCIL Take 1 packet by mouth daily.   timolol 0.5 % ophthalmic solution Commonly known as: TIMOPTIC Place 1 drop into both eyes 2 (two) times daily.            Discharge Care Instructions  (From admission, onward)         Start     Ordered   10/04/20 0000  Discharge wound care:       Comments: Needs ongoing wound care to sacral ulcer (foam dressing change BID). Left heel wound treated with iodine swab sticks to clean wound then keep heel floated off mattress   10/04/20 1138   10/02/20 0000  Discharge wound care:       Comments: Apply Iodine from the swab pads or swab sticks in clean utility, to the left heel wound. Float heel off of mattress   10/02/20 0726          No Known Allergies   Discharge Exam: BP (!) 153/70 (BP Location: Left Arm)   Pulse 74   Temp 98.3 F (36.8 C) (Oral)   Resp 18   Ht 5\' 9"  (1.753 m)   Wt 62.8 kg   SpO2 99%   BMI 20.45 kg/m  General appearance: calm, cooperative, appears chronically ill but resting in no distress Head: Normocephalic, without obvious abnormality, atraumatic Eyes: EOMI Lungs: scattered coarse sounds in anterior fields Heart: regular rate and rhythm and S1, S2 normal Abdomen: normal findings: bowel sounds normal and soft, non-tender Extremities: R AKA noted; no LE edema in LLE Skin: normal Neurologic: follows commands, moves all 4 limbs (can lift R AKA stump)  The results of significant diagnostics from this hospitalization (including  imaging, microbiology, ancillary and laboratory) are listed below for reference.   Microbiology: Recent Results (from the past 240 hour(s))  Respiratory Panel by RT PCR (Flu A&B, Covid) - Nasopharyngeal Swab     Status: None   Collection Time: 09/25/20 10:19 AM   Specimen: Nasopharyngeal Swab  Result Value Ref Range Status   SARS Coronavirus 2 by RT PCR NEGATIVE NEGATIVE Final    Comment: (NOTE) SARS-CoV-2 target nucleic acids are NOT DETECTED.  The SARS-CoV-2 RNA is generally detectable in upper respiratoy specimens during the acute phase of infection. The lowest concentration of SARS-CoV-2 viral copies this assay can  detect is 131 copies/mL. A negative result does not preclude SARS-Cov-2 infection and should not be used as the sole basis for treatment or other patient management decisions. A negative result may occur with  improper specimen collection/handling, submission of specimen other than nasopharyngeal swab, presence of viral mutation(s) within the areas targeted by this assay, and inadequate number of viral copies (<131 copies/mL). A negative result must be combined with clinical observations, patient history, and epidemiological information. The expected result is Negative.  Fact Sheet for Patients:  https://www.moore.com/  Fact Sheet for Healthcare Providers:  https://www.young.biz/  This test is no t yet approved or cleared by the [place] FDA and  has been authorized for detection and/or diagnosis of SARS-CoV-2 by FDA under an Emergency Use Authorization (EUA). This EUA will remain  in effect (meaning this test can be used) for the duration of the COVID-19 declaration under Section 564(b)(1) of the Act, 21 U.S.C. section 360bbb-3(b)(1), unless the authorization is terminated or revoked sooner.     Influenza A by PCR NEGATIVE NEGATIVE Final   Influenza B by PCR NEGATIVE NEGATIVE Final    Comment: (NOTE) The Xpert Xpress  SARS-CoV-2/FLU/RSV assay is intended as an aid in  the diagnosis of influenza from Nasopharyngeal swab specimens and  should not be used as a sole basis for treatment. Nasal washings and  aspirates are unacceptable for Xpert Xpress SARS-CoV-2/FLU/RSV  testing.  Fact Sheet for Patients: https://www.moore.com/  Fact Sheet for Healthcare Providers: https://www.young.biz/  This test is not yet approved or cleared by the [place] FDA and  has been authorized for detection and/or diagnosis of SARS-CoV-2 by  FDA under an Emergency Use Authorization (EUA). This EUA will remain  in effect (meaning this test can be used) for the duration of the  Covid-19 declaration under Section 564(b)(1) of the Act, 21  U.S.C. section 360bbb-3(b)(1), unless the authorization is  terminated or revoked. Performed at [facility], [address] [place], [place] [address]   Respiratory Panel by RT PCR (Flu A&B, Covid) - Nasopharyngeal Swab     Status: None   Collection Time: 09/27/20 11:07 AM   Specimen: Nasopharyngeal Swab  Result Value Ref Range Status   SARS Coronavirus 2 by RT PCR NEGATIVE NEGATIVE Final    Comment: (NOTE) SARS-CoV-2 target nucleic acids are NOT DETECTED.  The SARS-CoV-2 RNA is generally detectable in upper respiratoy specimens during the acute phase of infection. The lowest concentration of SARS-CoV-2 viral copies this assay can detect is 131 copies/mL. A negative result does not preclude SARS-Cov-2 infection and should not be used as the sole basis for treatment or other patient management decisions. A negative result may occur with  improper specimen collection/handling, submission of specimen other than nasopharyngeal swab, presence of viral mutation(s) within the areas targeted by this assay, and inadequate number of viral copies (<131 copies/mL). A negative result must be combined with clinical observations, patient history, and  epidemiological information. The expected result is Negative.  Fact Sheet for Patients:  https://www.moore.com/  Fact Sheet for Healthcare Providers:  https://www.young.biz/  This test is no t yet approved or cleared by the [place] FDA and  has been authorized for detection and/or diagnosis of SARS-CoV-2 by FDA under an Emergency Use Authorization (EUA). This EUA will remain  in effect (meaning this test can be used) for the duration of the COVID-19 declaration under Section 564(b)(1) of the Act, 21 U.S.C. section 360bbb-3(b)(1), unless the authorization is terminated or revoked sooner.  Influenza A by PCR NEGATIVE NEGATIVE Final   Influenza B by PCR NEGATIVE NEGATIVE Final    Comment: (NOTE) The Xpert Xpress SARS-CoV-2/FLU/RSV assay is intended as an aid in  the diagnosis of influenza from Nasopharyngeal swab specimens and  should not be used as a sole basis for treatment. Nasal washings and  aspirates are unacceptable for Xpert Xpress SARS-CoV-2/FLU/RSV  testing.  Fact Sheet for Patients: https://www.moore.com/  Fact Sheet for Healthcare Providers: https://www.young.biz/  This test is not yet approved or cleared by the [place] FDA and  has been authorized for detection and/or diagnosis of SARS-CoV-2 by  FDA under an Emergency Use Authorization (EUA). This EUA will remain  in effect (meaning this test can be used) for the duration of the  Covid-19 declaration under Section 564(b)(1) of the Act, 21  U.S.C. section 360bbb-3(b)(1), unless the authorization is  terminated or revoked. Performed at [facility], [address], [place], [place] [address]   Culture, blood (routine x 2)     Status: None   Collection Time: 09/27/20 11:14 AM   Specimen: Site Not Specified; Blood  Result Value Ref Range Status   Specimen Description SITE NOT SPECIFIED  Final   Special Requests    Final    BOTTLES DRAWN AEROBIC AND ANAEROBIC Blood Culture adequate volume   Culture   Final    NO GROWTH 5 DAYS Performed at [facility], [address] [place], [place] [address]    Report Status 10/02/2020 FINAL  Final  Culture, blood (routine x 2)     Status: None   Collection Time: 09/27/20 11:28 AM   Specimen: BLOOD LEFT WRIST  Result Value Ref Range Status   Specimen Description BLOOD LEFT WRIST  Final   Special Requests   Final    BOTTLES DRAWN AEROBIC AND ANAEROBIC Blood Culture adequate volume   Culture   Final    NO GROWTH 5 DAYS Performed at [facility], [address] [place], [place] [address]    Report Status 10/02/2020 FINAL  Final  MRSA PCR Screening     Status: None   Collection Time: 09/27/20  5:05 PM   Specimen: Nasopharyngeal  Result Value Ref Range Status   MRSA by PCR NEGATIVE NEGATIVE Final    Comment:        The GeneXpert MRSA Assay (FDA approved for NASAL specimens only), is one component of a comprehensive MRSA colonization surveillance program. It is not intended to diagnose MRSA infection nor to guide or monitor treatment for MRSA infections. Performed at [facility], [address], [place], [place] [address]      Labs: BNP (last 3 results) Recent Labs    08/14/20 0859 09/14/20 1445 09/27/20 1107  BNP 284.3* 1,634.6* 963.1*   Basic Metabolic Panel: Recent Labs  Lab 09/28/20 0254 09/28/20 1655 09/29/20 0020 09/29/20 1810 09/30/20 0548 10/01/20 0141 10/02/20 0124  NA 140  --  140  --  144 143 141  K 3.6  --  3.4*  --  3.1* 3.7 4.0  CL 102  --  104  --  103 105 104  CO2 27  --  24  --  27 27 27   GLUCOSE 132*  --  214*  --  133* 172* 119*  BUN 33*  --  44*  --  31* 26* 22  CREATININE 1.83*  --  1.74*  --  1.20 1.13 1.06  CALCIUM 8.2*  --  8.1*  --  8.5* 8.6* 8.6*  MG  --  2.0 1.9 1.8 1.7  --   --   PHOS  --  2.8 1.5* 5.1* 3.1  --   --    Liver Function Tests: No results for input(s): AST, ALT,  ALKPHOS, BILITOT, PROT, ALBUMIN in the last 168 hours. No results for input(s): LIPASE, AMYLASE in the last 168 hours. No results for input(s): AMMONIA in the last 168 hours. CBC: Recent Labs  Lab 09/28/20 0254 09/29/20 0020 09/30/20 0548 10/01/20 0141 10/02/20 0124  WBC 21.6* 12.4* 13.3* 13.0* 11.6*  HGB 8.9* 8.3* 9.4* 9.5* 9.3*  HCT 27.8* 26.1* 29.5* 30.3* 29.5*  MCV 84.8 86.1 87.0 85.6 86.0  PLT 250 210 254 247 245   Cardiac Enzymes: No results for input(s): CKTOTAL, CKMB, CKMBINDEX, TROPONINI in the last 168 hours. BNP: Invalid input(s): POCBNP CBG: Recent Labs  Lab 10/03/20 1640 10/03/20 2113 10/03/20 2214 10/03/20 2317 10/04/20 0838  GLUCAP 136* 56* 96 126* 155*   D-Dimer No results for input(s): DDIMER in the last 72 hours. Hgb A1c No results for input(s): HGBA1C in the last 72 hours. Lipid Profile No results for input(s): CHOL, HDL, LDLCALC, TRIG, CHOLHDL, LDLDIRECT in the last 72 hours. Thyroid function studies No results for input(s): TSH, T4TOTAL, T3FREE, THYROIDAB in the last 72 hours.  Invalid input(s): FREET3 Anemia work up No results for input(s): VITAMINB12, FOLATE, FERRITIN, TIBC, IRON, RETICCTPCT in the last 72 hours. Urinalysis    Component Value Date/Time   COLORURINE YELLOW 09/27/2020 1215   APPEARANCEUR CLOUDY (A) 09/27/2020 1215   LABSPEC 1.017 09/27/2020 1215   PHURINE 5.0 09/27/2020 1215   GLUCOSEU NEGATIVE 09/27/2020 1215   HGBUR NEGATIVE 09/27/2020 1215   BILIRUBINUR NEGATIVE 09/27/2020 1215   KETONESUR NEGATIVE 09/27/2020 1215   PROTEINUR 100 (A) 09/27/2020 1215   UROBILINOGEN 0.2 01/09/2009 1532   NITRITE NEGATIVE 09/27/2020 1215   LEUKOCYTESUR TRACE (A) 09/27/2020 1215   Sepsis Labs Invalid input(s): PROCALCITONIN,  WBC,  LACTICIDVEN Microbiology Recent Results (from the past 240 hour(s))  Respiratory Panel by RT PCR (Flu A&B, Covid) - Nasopharyngeal Swab     Status: None   Collection Time: 09/25/20 10:19 AM   Specimen:  Nasopharyngeal Swab  Result Value Ref Range Status   SARS Coronavirus 2 by RT PCR NEGATIVE NEGATIVE Final    Comment: (NOTE) SARS-CoV-2 target nucleic acids are NOT DETECTED.  The SARS-CoV-2 RNA is generally detectable in upper respiratoy specimens during the acute phase of infection. The lowest concentration of SARS-CoV-2 viral copies this assay can detect is 131 copies/mL. A negative result does not preclude SARS-Cov-2 infection and should not be used as the sole basis for treatment or other patient management decisions. A negative result may occur with  improper specimen collection/handling, submission of specimen other than nasopharyngeal swab, presence of viral mutation(s) within the areas targeted by this assay, and inadequate number of viral copies (<131 copies/mL). A negative result must be combined with clinical observations, patient history, and epidemiological information. The expected result is Negative.  Fact Sheet for Patients:  https://www.moore.com/  Fact Sheet for Healthcare Providers:  https://www.young.biz/  This test is no t yet approved or cleared by the [place] FDA and  has been authorized for detection and/or diagnosis of SARS-CoV-2 by FDA under an Emergency Use Authorization (EUA). This EUA will remain  in effect (meaning this test can be used) for the duration of the COVID-19 declaration under Section 564(b)(1) of the Act, 21 U.S.C.  section 360bbb-3(b)(1), unless the authorization is terminated or revoked sooner.     Influenza A by PCR NEGATIVE NEGATIVE Final   Influenza B by PCR NEGATIVE NEGATIVE Final    Comment: (NOTE) The Xpert Xpress SARS-CoV-2/FLU/RSV assay is intended as an aid in  the diagnosis of influenza from Nasopharyngeal swab specimens and  should not be used as a sole basis for treatment. Nasal washings and  aspirates are unacceptable for Xpert Xpress SARS-CoV-2/FLU/RSV  testing.  Fact Sheet  for Patients: https://www.moore.com/  Fact Sheet for Healthcare Providers: https://www.young.biz/  This test is not yet approved or cleared by the [place] FDA and  has been authorized for detection and/or diagnosis of SARS-CoV-2 by  FDA under an Emergency Use Authorization (EUA). This EUA will remain  in effect (meaning this test can be used) for the duration of the  Covid-19 declaration under Section 564(b)(1) of the Act, 21  U.S.C. section 360bbb-3(b)(1), unless the authorization is  terminated or revoked. Performed at [facility], [address], [place], [place] [address]   Respiratory Panel by RT PCR (Flu A&B, Covid) - Nasopharyngeal Swab     Status: None   Collection Time: 09/27/20 11:07 AM   Specimen: Nasopharyngeal Swab  Result Value Ref Range Status   SARS Coronavirus 2 by RT PCR NEGATIVE NEGATIVE Final    Comment: (NOTE) SARS-CoV-2 target nucleic acids are NOT DETECTED.  The SARS-CoV-2 RNA is generally detectable in upper respiratoy specimens during the acute phase of infection. The lowest concentration of SARS-CoV-2 viral copies this assay can detect is 131 copies/mL. A negative result does not preclude SARS-Cov-2 infection and should not be used as the sole basis for treatment or other patient management decisions. A negative result may occur with  improper specimen collection/handling, submission of specimen other than nasopharyngeal swab, presence of viral mutation(s) within the areas targeted by this assay, and inadequate number of viral copies (<131 copies/mL). A negative result must be combined with clinical observations, patient history, and epidemiological information. The expected result is Negative.  Fact Sheet for Patients:  https://www.moore.com/  Fact Sheet for Healthcare Providers:  https://www.young.biz/  This test is no t yet approved or cleared by the [place] FDA and  has been authorized for detection and/or diagnosis of SARS-CoV-2 by FDA under an Emergency Use Authorization (EUA). This EUA will remain  in effect (meaning this test can be used) for the duration of the COVID-19 declaration under Section 564(b)(1) of the Act, 21 U.S.C. section 360bbb-3(b)(1), unless the authorization is terminated or revoked sooner.     Influenza A by PCR NEGATIVE NEGATIVE Final   Influenza B by PCR NEGATIVE NEGATIVE Final    Comment: (NOTE) The Xpert Xpress SARS-CoV-2/FLU/RSV assay is intended as an aid in  the diagnosis of influenza from Nasopharyngeal swab specimens and  should not be used as a sole basis for treatment. Nasal washings and  aspirates are unacceptable for Xpert Xpress SARS-CoV-2/FLU/RSV  testing.  Fact Sheet for Patients: https://www.moore.com/  Fact Sheet for Healthcare Providers: https://www.young.biz/  This test is not yet approved or cleared by the [place] FDA and  has been authorized for detection and/or diagnosis of SARS-CoV-2 by  FDA under an Emergency Use Authorization (EUA). This EUA will remain  in effect (meaning this test can be used) for the duration of the  Covid-19 declaration under Section 564(b)(1) of the Act, 21  U.S.C. section 360bbb-3(b)(1), unless the authorization is  terminated or revoked. Performed at [facility], [address] [place], [place] [address]   Culture, blood (routine x 2)     Status: None   Collection Time: 09/27/20 11:14 AM   Specimen: Site Not Specified; Blood  Result Value Ref Range Status   Specimen Description SITE NOT SPECIFIED  Final   Special Requests   Final    BOTTLES DRAWN AEROBIC AND ANAEROBIC Blood Culture adequate volume   Culture   Final    NO GROWTH 5 DAYS Performed at [facility], [address] [place], [place] [address]    Report Status 10/02/2020 FINAL  Final  Culture, blood (routine x 2)     Status:  None   Collection Time: 09/27/20 11:28 AM   Specimen: BLOOD LEFT WRIST  Result Value Ref Range Status   Specimen Description BLOOD LEFT WRIST  Final   Special Requests   Final    BOTTLES DRAWN AEROBIC AND ANAEROBIC Blood Culture adequate volume   Culture   Final    NO GROWTH 5 DAYS Performed at [facility], [address] [place], [place] [address]    Report Status 10/02/2020 FINAL  Final  MRSA PCR Screening     Status: None   Collection Time: 09/27/20  5:05 PM   Specimen: Nasopharyngeal  Result Value Ref Range Status   MRSA by PCR NEGATIVE NEGATIVE Final    Comment:        The GeneXpert MRSA Assay (FDA approved for NASAL specimens only), is one component of a comprehensive MRSA colonization surveillance program. It is not intended to diagnose MRSA infection nor to guide or monitor treatment for MRSA infections. Performed at [facility], [address], [place], [place] [address]     Procedures/Studies: DG Chest 1 View  Result Date: 09/18/2020 CLINICAL DATA:  Central catheter placement.  Hypoxia. EXAM: CHEST  1 VIEW COMPARISON:  September 18, 2020 chest radiograph; chest CT September 14, 2020 FINDINGS: There is a left jugular catheter with the tip either at or overlying the lateral aspect of the aortic arch. This catheter tip may reside within a left superior intercostal vein. Arterial placement cannot be excluded, however. Endotracheal tube tip is 4.6 cm above the carina. Nasogastric tube tip and side port are below the diaphragm. No evident pneumothorax. There are pleural effusions bilaterally with airspace opacity throughout much of the right lung. A lucent area along the inferolateral right base is stable compared to earlier in the day there is subtle ill-defined opacity in portions of the left lower lung region. Heart is upper normal in size with pulmonary vascularity normal. No adenopathy. Probable small infarct in the right proximal humerus. IMPRESSION: 1. New  left subclavian catheter present with tip either in a left superior intercostal vein or possibly within the lateral aspect of the aortic arch. Advise sampling of blood from the catheter to ascertain venous versus arterial placement. 2. Endotracheal and nasogastric tube positions as described. No pneumothorax. 3. Persistent small pleural effusions bilaterally. Multifocal airspace opacity, considerably more on the right than on the left, stable compared to earlier in the day. Note the lucency in the lateral right base persists which potentially may represent focal developing abscess/focus of empyema. This finding was also present on prior CT examination September 14, 2020, supporting suspicion for infectious etiology for this lucency. These results will be called to the ordering clinician or representative by the Radiologist Assistant, and communication documented in the PACS or [employer]. 4. Stable cardiac silhouette. Aortic  Atherosclerosis (ICD10-I70.0). Electronically Signed   By: Bretta Bang III M.D.   On: 09/18/2020 14:27   DG Chest 2 View  Result Date: 09/24/2020 CLINICAL DATA:  Shortness of breath. EXAM: CHEST - 2 VIEW COMPARISON:  September 21, 2020. FINDINGS: Stable cardiomegaly. Probable central pulmonary vascular congestion is noted. No pneumothorax is noted. Bilateral perihilar and basilar opacities are noted concerning for edema or atelectasis with associated pleural effusions. Bony thorax is unremarkable. IMPRESSION: Stable cardiomegaly with central pulmonary vascular congestion. Bilateral perihilar and basilar opacities are noted concerning for edema or atelectasis with associated pleural effusions. Electronically Signed   By: Lupita Raider M.D.   On: 09/24/2020 08:05   CT Head Wo Contrast  Result Date: 09/27/2020 CLINICAL DATA:  Found unresponsive at rehab facility, oxygen desaturation in the 60s%, delirium EXAM: CT HEAD WITHOUT CONTRAST TECHNIQUE: Contiguous axial images were  obtained from the base of the skull through the vertex without intravenous contrast. Sagittal and coronal MPR images reconstructed from axial data set. COMPARISON:  08/09/2020 FINDINGS: Brain: Generalized atrophy. Normal ventricular morphology. No midline shift or mass effect. Small vessel chronic ischemic changes of deep cerebral white matter. No intracranial hemorrhage, mass lesion, evidence of acute infarction, or extra-axial fluid collection. Tiny parenchymal calcification noted centrally within LEFT pons unchanged. Vascular: Atherosclerotic calcifications of internal carotid and vertebral arteries at skull base Skull: Intact Sinuses/Orbits: Minimal fluid LEFT maxillary sinus. Other: Endotracheal and nasogastric tubes traverse oral cavity. IMPRESSION: Atrophy with small vessel chronic ischemic changes of deep cerebral white matter. No acute intracranial abnormalities. Electronically Signed   By: Ulyses Southward M.D.   On: 09/27/2020 12:22   CT Chest Wo Contrast  Result Date: 09/14/2020 CLINICAL DATA:  Pleural effusion, worsening hypoxia, multifocal pneumonia EXAM: CT CHEST WITHOUT CONTRAST TECHNIQUE: Multidetector CT imaging of the chest was performed following the standard protocol without IV contrast. COMPARISON:  08/27/2020 FINDINGS: Cardiovascular: Extensive multi-vessel coronary artery calcification. Mild global cardiomegaly. Mild left ventricular dilation. Relative hypoattenuation of the cardiac blood pool is in keeping with at least moderate anemia. No pericardial effusion. Central pulmonary arteries are enlarged in keeping with pulmonary arterial hypertension. Moderate atherosclerotic calcification is seen within the thoracic aorta. No aortic aneurysm. Mediastinum/Nodes: Visualized thyroid unremarkable. Shotty mediastinal adenopathy is likely reactive in nature. Right hilar adenopathy is not well assessed due to confluent pulmonary parenchymal disease on this noncontrast examination. The esophagus is  unremarkable. Lungs/Pleura: There is persistent subtotal collapse of the left lower lobe and total collapse of the lateral segment of the right middle lobe and the entire right lower lobe. Stable consolidation within the superior segment of the a right lower lobe and progressive consolidation involving the dependent right upper lobe. Increasing bronchial wall thickening within the aerated right upper lobe in keeping with airway inflammation. Stable trace interstitial pulmonary infiltrate most in keeping with superimposed mild interstitial pulmonary edema. Unchanged complex gas and fluid containing collection within the right pleural space most in keeping with a empyema measuring at least 5.2 x 13.0 x 10.7 cm in greatest dimension. There is associated pleural thickening, best noted at the lung base, in keeping with pleural inflammation. On the left, partially loculated small left pleural effusion is again seen with loculated fluid within the major fissure, unchanged. Mild pleural thickening is again noted, suggesting a complex effusion such as a parapneumonic effusion. Superinfection is difficult to exclude on this examination. Upper Abdomen: No acute abnormality. Musculoskeletal: No acute bone abnormality. IMPRESSION: Progressive consolidation within the right upper lobe. Stable  collapse and consolidation of the right middle lobe and lower lobes bilaterally. Stable complex gas and fluid collection within the right pleural space with associated pleural thickening most in keeping with a a empyema. Stable complex partially loculated small left pleural effusion largely within the interlobular fissure. Stable superimposed mild interstitial pulmonary edema. Extensive coronary artery calcification. Morphologic changes of pulmonary arterial hypertension. Aortic Atherosclerosis (ICD10-I70.0). Electronically Signed   By: Helyn Numbers MD   On: 09/14/2020 17:05   US RENAL  Result Date: 09/27/2020 CLINICAL DATA:  Renal  failure EXAM: RENAL / URINARY TRACT ULTRASOUND COMPLETE COMPARISON:  None. FINDINGS: Right Kidney: Renal measurements: 11.7 x 4.0 x 4.9 cm = volume: 120 mL. Echogenicity within normal limits. There is an anechoic cyst seen in the lower pole measuring 2.2 x 1.4 x 1 4 cm Left Kidney: Renal measurements: 10.1 x 4.2 x 4.0 cm = volume: 88 mL. Echogenicity within normal limits. No mass or hydronephrosis visualized. Bladder: The bladder is decompressed. Other: None. IMPRESSION: Right renal cyst. No renal calculi or hydronephrosis. Electronically Signed   By: Jonna Clark M.D.   On: 09/27/2020 17:54   US RENAL  Result Date: 09/14/2020 CLINICAL DATA:  Acute renal injury EXAM: RENAL / URINARY TRACT ULTRASOUND COMPLETE COMPARISON:  None. FINDINGS: Right Kidney: Renal measurements: 10.5 x 5.1 x 5.6 cm. = volume: 158 mL. 1.5 cm cyst is noted in the upper pole. No mass lesion or hydronephrosis is noted. Small extrarenal pelvis is seen. Left Kidney: Renal measurements: 9.4 x 4.5 x 4.6 cm. = volume: 103 mL. Echogenicity within normal limits. No mass or hydronephrosis visualized. Bladder: Appears normal for degree of bladder distention. Other: Note is made of a small left-sided pleural effusion. IMPRESSION: Small left pleural effusion. Right renal cyst. No other focal abnormality is noted. Electronically Signed   By: Alcide Clever M.D.   On: 09/14/2020 18:46   DG Chest 1V REPEAT Same Day  Result Date: 09/18/2020 CLINICAL DATA:  74 year old male status post chest tube placement. EXAM: CHEST - 1 VIEW SAME DAY COMPARISON:  Earlier radiograph dated 09/18/2020. FINDINGS: Interval placement of a pigtail right chest tube with tip over the inferior aspect of the right lung. No significant interval change in the size of right pleural collection or air compared to the earlier radiograph. Right subclavian central venous line with tip over central SVC close to the cavoatrial junction. Endotracheal tube remains above the carina and  enteric tube extends below the diaphragm with tip beyond the inferior margin of the image. Left IJ central venous line in similar position. Diffuse parenchymal densities and bilateral pleural effusions, similar to prior radiograph. Stable cardiac silhouette. No acute osseous pathology. IMPRESSION: 1. Interval placement of a right chest tube. No significant interval change in the size of the right pleural collection or air compared to the earlier radiograph. 2. Left IJ central venous line in similar position. Clinical correlation is recommended. Electronically Signed   By: Elgie Collard M.D.   On: 09/18/2020 16:37   DG Chest Port 1 View  Result Date: 09/28/2020 CLINICAL DATA:  Respiratory failure. EXAM: PORTABLE CHEST 1 VIEW COMPARISON:  09/27/2020 FINDINGS: ETT tip is stable above the carina. There is a enteric tube with tip below the level of the GE junction. Stable cardiomediastinal contours. Right pleural effusion is again noted with asymmetric veil like opacification of the right mid and lower lung. Hazy left mid and lower lung zone interstitial opacities are unchanged. IMPRESSION: 1. Stable support apparatus. 2. No change  in aeration to the lungs compared with previous exam. 3. Stable right pleural effusion. Electronically Signed   By: Signa Kell M.D.   On: 09/28/2020 05:03   DG Chest Portable 1 View  Result Date: 09/27/2020 CLINICAL DATA:  Hypoxia. EXAM: PORTABLE CHEST 1 VIEW COMPARISON:  September 24, 2020. FINDINGS: Stable cardiomediastinal silhouette. Endotracheal and nasogastric tubes are in grossly good position. No pneumothorax is noted. Mild bibasilar atelectasis or edema are noted with bilateral pleural effusions, right greater than left. Bony thorax is unremarkable. IMPRESSION: Mild bibasilar atelectasis or edema are noted with bilateral pleural effusions, right greater than left. Endotracheal and nasogastric tubes are in grossly good position. Aortic Atherosclerosis (ICD10-I70.0).  Electronically Signed   By: Lupita Raider M.D.   On: 09/27/2020 11:21   DG Chest Port 1 View  Result Date: 09/21/2020 CLINICAL DATA:  Hypoxia. EXAM: PORTABLE CHEST 1 VIEW COMPARISON:  09/20/2020 FINDINGS: Endotracheal tube and nasogastric tube have been removed. RIGHT subclavian line has been removed. Heart is enlarged in also accentuated by technique, stable in appearance. There are patchy opacities throughout the lungs bilaterally. There are bilateral pleural effusions. Lucency at the LATERAL RIGHT lung base appears stable and likely represents known empyema. There is increased atelectasis or early infiltrate in the LEFT LATERAL lung base. IMPRESSION: 1. Persistent bilateral infiltrates and pleural effusions. 2. Stable appearance of RIGHT empyema. 3. Increased atelectasis or early infiltrate in the LEFT LATERAL lung base. Electronically Signed   By: Norva Pavlov M.D.   On: 09/21/2020 18:33   DG CHEST PORT 1 VIEW  Result Date: 09/20/2020 CLINICAL DATA:  Respiratory failure. EXAM: PORTABLE CHEST 1 VIEW COMPARISON:  09/19/2020. FINDINGS: Endotracheal tube, NG tube, right subclavian line in stable position. Interim removal of right chest tube. Persistent air collection over the right lower chest most likely in the pleural space possibly related empyema. Persistent right lower lobe infiltrate. Persistent mild left base subsegmental atelectasis. No evidence of increasing pleural effusion on the right. Degenerative change thoracic spine. Evidence of old infarct in the proximal right humerus. IMPRESSION: 1. Interim removal of right chest tube. Persistent air collection over the right lower chest most likely in the pleural space possibly related to empyema. No interim change. 2. Persistent right lower lobe infiltrate. Persistent mild left base subsegmental atelectasis. No evidence of increasing pleural effusion on the right. Chest is unchanged from prior exam. Electronically Signed   By: Maisie Fus  Register    On: 09/20/2020 05:21   DG Chest Port 1 View  Result Date: 09/19/2020 CLINICAL DATA:  Hypoxia.  Recent cardiac arrest EXAM: PORTABLE CHEST 1 VIEW COMPARISON:  September 18, 2020 chest radiograph; chest CT September 14, 2020 FINDINGS: Endotracheal tube tip is 3.5 cm above the carina. Nasogastric tube tip and side port are below the diaphragm. Central catheter tip is in the superior vena cava. Chest tube is again noted on the right, unchanged in position. No appreciable pneumothorax. Pleural effusions bilaterally, larger on the right than on the left noted. Airspace consolidation right lower lung region with focal air along the lateral right hemithorax, likely due to empyema/developing abscess. There is less opacity in the left base compared to 1 day prior with mild left base atelectasis noted. Heart borderline enlarged with pulmonary vascularity normal. There is aortic atherosclerosis. No adenopathy. No bone lesions. IMPRESSION: Tube and catheter positions as described without pneumothorax. Stable chest tube positioning. Bilateral pleural effusions with airspace opacity right lower lung region, stable. Air along the inferolateral right hemithorax is stable,  likely due to empyema/developing abscess. Less opacity left base compared to 1 day prior with mild left base atelectasis present. Stable cardiac silhouette. Aortic Atherosclerosis (ICD10-I70.0). Electronically Signed   By: Bretta BangWilliam  Woodruff III M.D.   On: 09/19/2020 08:55   DG Chest Port 1 View  Result Date: 09/18/2020 CLINICAL DATA:  Abnormal respiration.  Evaluate tube placement. EXAM: PORTABLE CHEST 1 VIEW COMPARISON:  Chest radiograph earlier today.  Chest CT 09/14/2020 FINDINGS: Left internal jugular line has been removed. Unchanged endotracheal tube tip 4.2 cm from the carina. Enteric tube in place with tip below the diaphragm not included in the field of view. Right subclavian central line tip in the SVC. Pigtail catheter at the right lung base is  unchanged in position. Extrapleural air collection at the right lung base is not significantly changed, empyema demonstrated on CT. No pneumothorax. Left pleural effusion and basilar airspace disease, not significantly changed. Additional multifocal airspace opacities are unchanged. Stable cardiomegaly. IMPRESSION: 1. Interval removal of left internal jugular line. Remaining support apparatus is unchanged. 2. Unchanged extrapleural air collection at the right lung base, empyema demonstrated on CT. 3. Unchanged left pleural effusion and basilar airspace disease. Additional multifocal airspace opacities are unchanged. 4. Stable cardiomegaly Electronically Signed   By: Narda RutherfordMelanie  Sanford M.D.   On: 09/18/2020 21:17   DG Chest Port 1 View  Result Date: 09/18/2020 CLINICAL DATA:  Abnormal respirations. EXAM: PORTABLE CHEST 1 VIEW COMPARISON:  09/18/2020 FINDINGS: The endotracheal tube tip is stable above the carina. There is a nasogastric tube with tip below the GE junction. Cardiomediastinal contours are stable. No change in bilateral pulmonary opacities. Unchanged appearance of loculated gas and fluid collection overlying the inferolateral right lung base. Left pleural effusion is also unchanged. IMPRESSION: 1. No change in aeration to the lungs compared with previous exam. 2. Stable support apparatus. Electronically Signed   By: Signa Kellaylor  Stroud M.D.   On: 09/18/2020 11:45   DG CHEST PORT 1 VIEW  Result Date: 09/18/2020 CLINICAL DATA:  Sudden onset shortness of breath EXAM: PORTABLE CHEST 1 VIEW COMPARISON:  09/17/2020 FINDINGS: Tubing overlies the left chest and mediastinum and is presumably external. Persistent right greater left pulmonary opacities. Right pleural gas and fluid collection appears similar. Stable cardiomediastinal contours. IMPRESSION: Similar lung aeration with right greater than left opacities. Right pleural gas and fluid collection also appears similar. Electronically Signed   By: Guadlupe SpanishPraneil   Patel M.D.   On: 09/18/2020 08:09   DG Chest Port 1 View  Result Date: 09/17/2020 CLINICAL DATA:  Dyspnea EXAM: PORTABLE CHEST 1 VIEW COMPARISON:  09/14/2020 FINDINGS: Lung aeration is similar with bilateral pulmonary opacities persisting. There is greater opacification at the right lung base. Right pleural gas and fluid collection appears slightly increased. Similar cardiomediastinal contours. IMPRESSION: Slight increase in right pleural gas and fluid collection. Persistent bilateral pulmonary opacities with greater opacification at the right lung base. Electronically Signed   By: Guadlupe SpanishPraneil  Patel M.D.   On: 09/17/2020 09:06   DG Chest Portable 1 View  Result Date: 09/14/2020 CLINICAL DATA:  Shortness of breath. EXAM: PORTABLE CHEST 1 VIEW COMPARISON:  08/26/2020 radiographs and 08/27/2020 CT chest. FINDINGS: Bilateral patchy airspace opacities, similar to prior. Similar appearance of a chronic right pleural fluid collection with internal gas. Suspect layering left pleural effusion. No discernible pneumothorax. Cardiac silhouette is enlarged. No acute osseous abnormality. Aortic atherosclerosis. IMPRESSION: 1. Similar bilateral airspace opacities, compatible with multifocal pneumonia. 2. Suspected layering left pleural effusion and similar chronic right pleural  effusion with internal gas. Electronically Signed   By: Feliberto Harts MD   On: 09/14/2020 13:30   DG Foot 2 Views Left  Result Date: 09/15/2020 CLINICAL DATA:  Heel wound for several months, no known injury, initial encounter EXAM: LEFT FOOT - 2 VIEW COMPARISON:  08/09/2020 FINDINGS: Soft tissue wound is noted along the posterior aspect of the calcaneus. Calcaneal spurs are noted. No bony destruction is noted to suggest osteomyelitis. Degenerative changes of the tarsal bones are seen. Mild osteopenia is noted similar to that seen on the prior study. Forefoot soft tissue swelling is noted as well stable from the previous exam. IMPRESSION:  Stable soft tissue swelling. No bony erosive changes to suggest osteomyelitis. Stable heel wound Electronically Signed   By: Alcide Clever M.D.   On: 09/15/2020 16:28   DG Swallowing Func-Speech Pathology  Result Date: 09/23/2020 Objective Swallowing Evaluation: Type of Study: MBS-Modified Barium Swallow Study  Patient Details Name: Jeremiah [name] MRN: [id number] Date of Birth: 24-May-1946 Today's Date: 09/23/2020 Time: SLP Start Time (ACUTE ONLY): 1059 -SLP Stop Time (ACUTE ONLY): 1120 SLP Time Calculation (min) (ACUTE ONLY): 21 min Past Medical History: Past Medical History: Diagnosis Date . Amputee  . Aortic atherosclerosis (HCC)  . Arthritis   "joints; shoulders, knees, hands, back" (05/21/2018) . Atherosclerosis of coronary artery  . C. difficile diarrhea 04/2018 . Diastolic CHF (HCC)  . Diverticulosis  . High cholesterol  . History of gout  . Hypertension  . IDA (iron deficiency anemia)   from referral Dr Darleene Cleaver . Internal hemorrhoids  . Peripheral vascular disease (HCC)  . Pneumonia   "couple times" (05/21/2018) . Sleep apnea   "has mask; won't use" (05/21/2018) . Type II diabetes mellitus (HCC)  Past Surgical History: Past Surgical History: Procedure Laterality Date . ABDOMINAL AORTOGRAM W/LOWER EXTREMITY N/A 11/01/2018  Procedure: ABDOMINAL AORTOGRAM W/LOWER EXTREMITY;  Surgeon: Runell Gess, MD;  Location: [facility];  Service: Cardiovascular;  Laterality: N/A; . ABDOMINAL AORTOGRAM W/LOWER EXTREMITY Left 06/20/2020  Procedure: ABDOMINAL AORTOGRAM W/LOWER EXTREMITY;  Surgeon: Cephus Shelling, MD;  Location: [facility];  Service: Cardiovascular;  Laterality: Left; . AMPUTATION Right 11/09/2018  Procedure: RIGHT - AMPUTATION ABOVE KNEE;  Surgeon: Maeola Harman, MD;  Location: [facility] OR;  Service: Vascular;  Laterality: Right; . CATARACT EXTRACTION W/ INTRAOCULAR LENS  IMPLANT, BILATERAL Bilateral  . CHOLECYSTECTOMY  05/21/2018  ATTEMPTED LAPAROSCOPIC CHOLECYSTECTOMY, OPEN  DRAINAGE OF GALLBLADDER WITH BIOPSY . CHOLECYSTECTOMY N/A 05/21/2018  Procedure: ATTEMPTED LAPAROSCOPIC CHOLECYSTECTOMY, OPEN DRAINAGE OF GALLBLADDER WITH BIOPSY;  Surgeon: Griselda Miner, MD;  Location: [facility] OR;  Service: General;  Laterality: N/A; . COLONOSCOPY W/ POLYPECTOMY   . COLONOSCOPY WITH PROPOFOL N/A 09/16/2018  Procedure: COLONOSCOPY WITH PROPOFOL;  Surgeon: Charlott Rakes, MD;  Location: [facility]OS[facility];  Service: Endoscopy;  Laterality: N/A; . 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 FLEXIBLE SIGMOIDOSCOPY N/A 04/20/2020  Procedure: FLEXIBLE SIGMOIDOSCOPY;  Surgeon: Meridee Score Netty Starring., MD;  Location: [facility];  Service: Gastroenterology;  Laterality: N/A;  Fecal Disimpaction . IR CATHETER TUBE CHANGE  04/14/2018 . IR CHOLANGIOGRAM EXISTING TUBE  03/17/2018 . IR PERC CHOLECYSTOSTOMY  01/31/2018 . IR RADIOLOGIST EVAL & MGMT  03/02/2018 . PERIPHERAL VASCULAR INTERVENTION Left 06/20/2020  Procedure: PERIPHERAL VASCULAR INTERVENTION;  Surgeon: Cephus Shelling, MD;  Location: [facility] INVASIVE CV LAB;  Service: Cardiovascular;  Laterality: Left;  4 SFA STENTS HPI: 74  yo male resident of [place] [facility] with medical history significant of peripheral vascular disease, s/p right AKI, chronic diastolic heart failure, hypertension type 2 diabetes mellitus presented with dyspnea and hypoxia was in hospital from 08/09/20 to 08/21/20 with COVID 19 and aspiration pneumonia complicated by Rt pleural effusion treated with pig tail catheter drainage.  He was then in hospital from 08/26/20 to 08/30/20 with HCAP.  He presented to ER again on 09/14/20 with dyspnea from HCAP and Rt pleural effusion. Pt evaluated by SLP on 10/23 and found to have no signs of dysphagia. On 10/26 pt developed respiratory distress and cardiac arrest and was intubated until 10/28. Again on 10/29 pt had some respiratory distress. There  was concern for aspiration.  Subjective: pleasant, son in room with him, upset he is not able to go to wife's funeral today Assessment / Plan / Recommendation CHL IP CLINICAL IMPRESSIONS 09/23/2020 Clinical Impression Pt presented with pharyngeal dysphagia characterized by mildly reduced anterior laryngeal movement and reduced duration of cricopharyngeal relaxation which resulted in pyriform sinus residue across consistencies.No functional benefit was noted with postural modifications. A liquid wash reduced, but did not eliminate, residue and pt was unable to demonstrate a mendelsohn maneuver. Penetration (PAS 2) was noted with consecutive swallows of thin liquids, but this is considered to be WNL and no instances of aspiration were demonstrated. Pt denied sensation of pharyngeal residue throughout the study. Due to the pt's recent episode of "choking", the potential impact of pyriform sinus residue on safety is considered. A dysphagia 3 diet with thin liquids is recommended at this time with observance of swallowing precautions. SLP will follow for diet tolerance.  SLP Visit Diagnosis Dysphagia, unspecified (R13.10) Attention and concentration deficit following -- Frontal lobe and executive function deficit following -- Impact on safety and function Moderate aspiration risk   [facility]L IP TREATMENT RECOMMENDATION 09/23/2020 Treatment Recommendations Therapy as outlined in treatment plan below   Prognosis 09/23/2020 Prognosis for Safe Diet Advancement Good Barriers to Reach Goals -- Barriers/Prognosis Comment -- [facility] IP DIET RECOMMENDATION 09/23/2020 SLP Diet Recommendations Dysphagia 3 (Mech soft) solids;Thin liquid Liquid Administration via Cup;Straw Medication Administration Whole meds with liquid Compensations Slow rate;Small sips/bites;Follow solids with liquid Postural Changes Seated upright at 90 degrees;Remain semi-upright after after feeds/meals (Comment)   [facility] IP OTHER RECOMMENDATIONS 09/23/2020 Recommended  Consults -- Oral Care Recommendations Oral care BID Other Recommendations --   [facility] IP FOLLOW UP RECOMMENDATIONS 09/23/2020 Follow up Recommendations Skilled Nursing facility   [facility] IP FREQUENCY AND DURATION 09/23/2020 Speech Therapy Frequency (ACUTE ONLY) min 2x/week Treatment Duration 2 weeks      [facility] IP ORAL PHASE 09/23/2020 Oral Phase WFL Oral - Pudding Teaspoon -- Oral - Pudding Cup -- Oral - Honey Teaspoon -- Oral - Honey Cup -- Oral - Nectar Teaspoon -- Oral - Nectar Cup -- Oral - Nectar Straw -- Oral - Thin Teaspoon -- Oral - Thin Cup -- Oral - Thin Straw -- Oral - Puree -- Oral - Mech Soft -- Oral - Regular -- Oral - Multi-Consistency -- Oral - Pill -- Oral Phase - Comment --  [facility] IP PHARYNGEAL PHASE 09/23/2020 Pharyngeal Phase Impaired Pharyngeal- Pudding Teaspoon -- Pharyngeal -- Pharyngeal- Pudding Cup -- Pharyngeal -- Pharyngeal- Honey Teaspoon -- Pharyngeal -- Pharyngeal- Honey Cup -- Pharyngeal -- Pharyngeal- Nectar Teaspoon -- Pharyngeal -- Pharyngeal- Nectar Cup -- Pharyngeal -- Pharyngeal- Nectar Straw -- Pharyngeal -- Pharyngeal- Thin Teaspoon -- Pharyngeal -- Pharyngeal- Thin Cup -- Pharyngeal -- Pharyngeal- Thin Straw -- Pharyngeal --  Pharyngeal- Puree -- Pharyngeal -- Pharyngeal- Mechanical Soft -- Pharyngeal -- Pharyngeal- Regular -- Pharyngeal -- Pharyngeal- Multi-consistency -- Pharyngeal -- Pharyngeal- Pill -- Pharyngeal -- Pharyngeal Comment --  CH[facility] IP CERVICAL ESOPHAGEAL PHASE 09/23/2020 Cervical Esophageal Phase Reduced duration of cricopharyngeal relaxation but otherwise WNL Pudding Teaspoon -- Pudding Cup -- Honey Teaspoon -- Honey Cup -- Nectar Teaspoon -- Nectar Cup -- Nectar Straw -- Thin Teaspoon -- Thin Cup -- Thin Straw -- Puree -- Mechanical Soft -- Regular -- Multi-consistency -- Pill -- Cervical Esophageal Comment -- Shanika I. Vear Clock, MS, CCC-SLP Acute Rehabilitation Services Office number [phone] Pager [phone] Scheryl Marten 09/23/2020, 12:52 PM               IR US CHEST  Result Date: 09/18/2020 CLINICAL DATA:  Evaluate pleural effusion for thoracentesis. EXAM: CHEST ULTRASOUND COMPARISON:  Chest radiograph 09/17/2020 FINDINGS: Small amount of right pleural fluid that may be slightly loculated. Limited percutaneous window for thoracentesis. IMPRESSION: Small right pleural effusion.  Thoracentesis not performed. Electronically Signed   By: Richarda Overlie M.D.   On: 09/18/2020 09:14   VAS [place] LOWER EXTREMITY VENOUS (DVT)  Result Date: 09/28/2020  Lower Venous DVT Study Indications: Swelling, and Edema.  Risk Factors: None identified. Performing Technologist: Jannet Askew RCT RDMS  Examination Guidelines: A complete evaluation includes B-mode imaging, spectral Doppler, color Doppler, and power Doppler as needed of all accessible portions of each vessel. Bilateral testing is considered an integral part of a complete examination. Limited examinations for reoccurring indications may be performed as noted. The reflux portion of the exam is performed with the patient in reverse Trendelenburg.  +---------+---------------+---------+-----------+----------+--------------+ RIGHT    CompressibilityPhasicitySpontaneityPropertiesThrombus Aging +---------+---------------+---------+-----------+----------+--------------+ CFV      Full           Yes      Yes                                 +---------+---------------+---------+-----------+----------+--------------+ SFJ      Full                                                        +---------+---------------+---------+-----------+----------+--------------+ FV Prox  Full                                                        +---------+---------------+---------+-----------+----------+--------------+ FV Mid   Full                                                        +---------+---------------+---------+-----------+----------+--------------+ FV DistalFull                                                         +---------+---------------+---------+-----------+----------+--------------+ PFV      Full                                                        +---------+---------------+---------+-----------+----------+--------------+  POP      Full           Yes      Yes                                 +---------+---------------+---------+-----------+----------+--------------+ PTV      Full                                                        +---------+---------------+---------+-----------+----------+--------------+ PERO     Full                                                        +---------+---------------+---------+-----------+----------+--------------+   +---------+---------------+---------+-----------+----------+--------------+ LEFT     CompressibilityPhasicitySpontaneityPropertiesThrombus Aging +---------+---------------+---------+-----------+----------+--------------+ CFV      Full           Yes      Yes                                 +---------+---------------+---------+-----------+----------+--------------+ SFJ      Full                                                        +---------+---------------+---------+-----------+----------+--------------+ FV Prox  Full                                                        +---------+---------------+---------+-----------+----------+--------------+ FV Mid   Full                                                        +---------+---------------+---------+-----------+----------+--------------+ FV DistalFull                                                        +---------+---------------+---------+-----------+----------+--------------+ PFV      Full                                                        +---------+---------------+---------+-----------+----------+--------------+ POP      Full           Yes      Yes                                  +---------+---------------+---------+-----------+----------+--------------+  PTV      Full                                                        +---------+---------------+---------+-----------+----------+--------------+ PERO     Full                                                        +---------+---------------+---------+-----------+----------+--------------+     *See table(s) above for measurements and observations. Electronically signed by Lemar Livings MD on 09/28/2020 at 4:04:06 PM.    Final    ECHOCARDIOGRAM LIMITED  Result Date: 09/27/2020    ECHOCARDIOGRAM LIMITED REPORT   Patient Name:   Jeremiah [name] Date of Exam: 09/27/2020 Medical Rec #:  [id number]       Height:       69.0 in Accession #:    [id number]      Weight:       123.5 lb Date of Birth:  01/26/1946       BSA:          1.683 m Patient Age:    74 years        BP:           100/60 mmHg Patient Gender: M               HR:           77 bpm. Exam Location:  Inpatient Procedure: Limited Echo, Limited Color Doppler and Cardiac Doppler STAT ECHO Indications:    Pericardial Effusion I31.3  History:        Patient has prior history of Echocardiogram examinations, most                 recent 09/18/2020. Risk Factors:Diabetes and Hypertension.  Sonographer:    Thurman Coyer RDCS (AE) Referring Phys: [id number] JESSICA MARSHALL IMPRESSIONS  1. Persistent moderate pericardial effusion predominantly around the LV apex. No evidence for a tamponade.  2. Left ventricular ejection fraction, by estimation, is 40 to 45%. The left ventricle has mildly decreased function. The left ventricle demonstrates global hypokinesis.  3. Moderate pericardial effusion. The pericardial effusion is surrounding the apex. There is no evidence of cardiac tamponade.  4. There is severe calcifcation of the aortic valve. There is severe thickening of the aortic valve. Aortic valve regurgitation is moderate. FINDINGS  Left Ventricle: Left ventricular ejection fraction,  by estimation, is 40 to 45%. The left ventricle has mildly decreased function. The left ventricle demonstrates global hypokinesis. Pericardium: A moderately sized pericardial effusion is present. The pericardial effusion is surrounding the apex. There is no evidence of cardiac tamponade. Aortic Valve: There is severe calcifcation of the aortic valve. There is severe thickening of the aortic valve. Aortic valve regurgitation is moderate. Aortic valve mean gradient measures 15.0 mmHg. Aortic valve peak gradient measures 24.2 mmHg. AORTIC VALVE AV Vmax:           246.00 cm/s AV Vmean:          185.000 cm/s AV VTI:            0.467 m AV Peak Grad:      24.2 mmHg AV  Mean Grad:      15.0 mmHg LVOT Vmax:         77.00 cm/s LVOT Vmean:        49.700 cm/s LVOT VTI:          0.153 m LVOT/AV VTI ratio: 0.33  SHUNTS Systemic VTI: 0.15 m Tobias Alexander MD Electronically signed by Tobias Alexander MD Signature Date/Time: 09/27/2020/3:21:31 PM    Final    ECHOCARDIOGRAM LIMITED  Result Date: 09/18/2020    ECHOCARDIOGRAM LIMITED REPORT   Patient Name:   Jeremiah [name] Date of Exam: 09/18/2020 Medical Rec #:  [id number]       Height:       69.0 in Accession #:    [id number]      Weight:       139.0 lb Date of Birth:  Apr 05, 1946       BSA:          1.770 m Patient Age:    74 years        BP:           148/61 mmHg Patient Gender: M               HR:           69 bpm. Exam Location:  Inpatient Procedure: Limited Echo, Limited Color Doppler and Cardiac Doppler STAT ECHO Indications:    Cardiac arrest I46.9  History:        Patient has prior history of Echocardiogram examinations, most                 recent 06/19/2020. Risk Factors:Hypertension, Diabetes and                 Dyslipidemia. Pleural effusion.  Sonographer:    Renella Cunas RDCS Referring Phys: [id number] Lorin Glass  Sonographer Comments: Echo performed with patient supine and on artificial respirator. IMPRESSIONS  1. Left ventricular ejection fraction, by estimation, is  40 to 45%. The left ventricle has mildly decreased function. The left ventricle demonstrates regional wall motion abnormalities (see scoring diagram/findings for description). Left ventricular diastolic parameters are consistent with Grade II diastolic dysfunction (pseudonormalization). Elevated left atrial pressure. There is mild hypokinesis of the left ventricular, basal-mid inferior wall and inferolateral wall.  2. Right ventricular systolic function is normal. The right ventricular size is normal. Tricuspid regurgitation signal is inadequate for assessing PA pressure.  3. Moderate pericardial effusion. The pericardial effusion is surrounding the apex. There is no evidence of cardiac tamponade.  4. The mitral valve is normal in structure. Trivial mitral valve regurgitation.  5. The aortic valve is tricuspid. There is mild calcification of the aortic valve. There is mild thickening of the aortic valve. Aortic valve regurgitation is trivial. Moderate aortic valve stenosis.  6. The inferior vena cava is normal in size with <50% respiratory variability, suggesting right atrial pressure of 8 mmHg. Comparison(s): Prior images reviewed side by side. Changes from prior study are noted. The left ventricular function is significantly worse. Aortic valve gradients are lower compared to the previous study due to decreased left ventricular systolic function. Although there is global LV hypokinesis, this appears to be disproportionately worse in the inferior and inferolateral wall. FINDINGS  Left Ventricle: Left ventricular ejection fraction, by estimation, is 40 to 45%. The left ventricle has mildly decreased function. The left ventricle demonstrates regional wall motion abnormalities. Mild hypokinesis of the left ventricular, basal-mid inferior wall and inferolateral wall. The left ventricular internal cavity size  was normal in size. There is no left ventricular hypertrophy. Left ventricular diastolic parameters are  consistent with Grade II diastolic dysfunction (pseudonormalization). Elevated left atrial pressure. Right Ventricle: The right ventricular size is normal. No increase in right ventricular wall thickness. Right ventricular systolic function is normal. Tricuspid regurgitation signal is inadequate for assessing PA pressure. Left Atrium: Left atrial size was normal in size. Right Atrium: Right atrial size was normal in size. Pericardium: A moderately sized pericardial effusion is present. The pericardial effusion is surrounding the apex. There is no evidence of cardiac tamponade. Mitral Valve: The mitral valve is normal in structure. Mild mitral annular calcification. Trivial mitral valve regurgitation. Aortic Valve: The aortic valve is tricuspid. There is mild calcification of the aortic valve. There is mild thickening of the aortic valve. Aortic valve regurgitation is trivial. Moderate aortic stenosis is present. Aortic valve mean gradient measures 16.0 mmHg. Aortic valve peak gradient measures 28.7 mmHg. Aortic valve area, by VTI measures 1.18 cm. Pulmonic Valve: The pulmonic valve was normal in structure. Pulmonic valve regurgitation is not visualized. Aorta: The aortic root and ascending aorta are structurally normal, with no evidence of dilitation. Venous: The inferior vena cava is normal in size with less than 50% respiratory variability, suggesting right atrial pressure of 8 mmHg. IAS/Shunts: No atrial level shunt detected by color flow Doppler. LEFT VENTRICLE PLAX 2D LVIDd:         5.20 cm      Diastology LVIDs:         3.80 cm      LV e' medial:    3.57 cm/s LV PW:         0.80 cm      LV E/e' medial:  26.4 LV IVS:        0.80 cm      LV e' lateral:   3.65 cm/s LVOT diam:     2.10 cm      LV E/e' lateral: 25.8 LV SV:         74 LV SV Index:   42 LVOT Area:     3.46 cm  LV Volumes (MOD) LV vol d, MOD A2C: 132.0 ml LV vol d, MOD A4C: 90.6 ml LV vol s, MOD A2C: 82.0 ml LV vol s, MOD A4C: 51.3 ml LV SV MOD A2C:      50.0 ml LV SV MOD A4C:     90.6 ml LV SV MOD BP:      50.3 ml RIGHT VENTRICLE RV S prime:     10.80 cm/s TAPSE (M-mode): 1.8 cm LEFT ATRIUM         Index LA diam:    3.30 cm 1.86 cm/m  AORTIC VALVE AV Area (Vmax):    1.01 cm AV Area (Vmean):   1.00 cm AV Area (VTI):     1.18 cm AV Vmax:           268.00 cm/s AV Vmean:          188.000 cm/s AV VTI:            0.629 m AV Peak Grad:      28.7 mmHg AV Mean Grad:      16.0 mmHg LVOT Vmax:         78.41 cm/s LVOT Vmean:        54.147 cm/s LVOT VTI:          0.214 m LVOT/AV VTI ratio: 0.34  AORTA Ao Root diam: 2.90 cm MITRAL VALVE  MV Area (PHT): 4.57 cm     SHUNTS MV Decel Time: 166 msec     Systemic VTI:  0.21 m MV E velocity: 94.30 cm/s   Systemic Diam: 2.10 cm MV A velocity: 101.00 cm/s MV E/A ratio:  0.93 Mihai Croitoru MD Electronically signed by Thurmon Fair MD Signature Date/Time: 09/18/2020/2:55:51 PM    Final      Time coordinating discharge: Over 30 minutes    Lewie Chamber, MD  [facility] 10/04/2020, 11:40 AMc

## 2020-10-04 NOTE — Progress Notes (Signed)
Called [facility] to give report was transferred to voicemail line.

## 2020-10-04 NOTE — NC FL2 (Signed)
[place] MEDICAID FL2 LEVEL OF CARE SCREENING TOOL     IDENTIFICATION  Patient Name: Jeremiah [name] Birthdate: 12/18/1945 Sex: male Admission Date (Current Location): 09/27/2020  [facility] and [place] Number:  Producer, television/film/video and Address:  [facility], [address] [place], [place] [address]      Provider Number: [id number]  Attending Physician Name and Address:  Lewie Chamber, MD  Relative Name and Phone Number:  [name], son, [phone]    Current Level of Care: Hospital Recommended Level of Care: Skilled Nursing Facility Prior Approval Number:    Date Approved/Denied:   PASRR Number: [id number]  Discharge Plan: SNF    Current Diagnoses: Patient Active Problem List   Diagnosis Date Noted  . Aortic atherosclerosis (HCC) 10/03/2020  . Acute hypoxemic respiratory failure (HCC) 09/27/2020  . Acute respiratory failure (HCC) 09/27/2020  . Protein-calorie malnutrition, severe 09/21/2020  . Palliative care by specialist   . Goals of care, counseling/discussion   . CAP (community acquired pneumonia) 08/27/2020  . Pneumonia 08/27/2020  . Acute respiratory failure with hypoxia (HCC)   . Chest tube in place   . Pneumonia due to COVID-19 virus 08/09/2020  . Acute metabolic encephalopathy 08/09/2020  . Dysphagia 08/09/2020  . Weakness   . Acute lower UTI 06/25/2020  . Abdominal pain   . Gangrene (HCC) 06/18/2020  . Post-operative pain 04/21/2019  . Abnormality of gait 03/24/2019  . Hypoglycemia   . Hyperkalemia   . Hyponatremia   . Diabetic peripheral neuropathy (HCC)   . Labile blood glucose   . Enteritis due to Clostridium difficile   . Blood glucose abnormal   . Diarrhea   . Benign essential HTN   . Hypoalbuminemia due to protein-calorie malnutrition (HCC)   . Labile blood pressure   . 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 Postoperative pain   . Acute blood loss anemia   . Dyslipidemia   . Type 2 diabetes mellitus with  peripheral neuropathy (HCC)   . S/P AKA (above knee amputation) (HCC) 11/12/2018  . Unilateral AKA, right (HCC)   . Hypertensive crisis   . Diabetes mellitus type 2 in nonobese (HCC)   . Pleural effusion 11/08/2018  . Fever   . Open wound of right foot   . Sepsis (HCC) 11/03/2018  . Altered mental status   . Acute cystitis without hematuria   . Leukocytosis   . Pressure injury of skin 07/08/2018  . Clostridium difficile colitis 07/07/2018  . Hypokalemia 07/07/2018  . HLD (hyperlipidemia) 07/07/2018  . Acute diverticulitis 06/04/2018  . Cholecystitis 05/24/2018  . Cholecystitis with cholelithiasis 05/21/2018  . Acute systolic heart failure (HCC) 03/27/2018  . Diabetes mellitus without complication (HCC) 01/30/2018  . Hypertension 01/30/2018  . Acute cholecystitis 01/30/2018  . AKI (acute kidney injury) (HCC) 01/30/2018  . Normocytic anemia 01/30/2018  . PVD (peripheral vascular disease) (HCC) 09/05/2014    Orientation RESPIRATION BLADDER Height & Weight     Self, Time, Situation  Normal Incontinent Weight: 138 lb 7.2 oz (62.8 kg) Height:  5\' 9"  (175.3 cm)  BEHAVIORAL SYMPTOMS/MOOD NEUROLOGICAL BOWEL NUTRITION STATUS      Incontinent Diet (general, see discharge summary)  AMBULATORY STATUS COMMUNICATION OF NEEDS Skin   Total Care Verbally Other (Comment) (scratches)                       Personal Care Assistance Level of Assistance    Bathing Assistance: Maximum assistance Feeding assistance:  Limited assistance Dressing Assistance: Maximum assistance     Functional Limitations Info    Sight Info: Adequate Hearing Info: Impaired Speech Info: Adequate    SPECIAL CARE FACTORS FREQUENCY  PT (By licensed PT), OT (By licensed OT), Speech therapy     PT Frequency: 5x week OT Frequency: 5x week     Speech Therapy Frequency: 2x week      Contractures Contractures Info: Not present    Additional Factors Info    Code Status Info: full Allergies Info: NKA    Insulin Sliding Scale Info: Novolog 0-6 units 3x day with meals       Current Medications (10/04/2020):  This is the current hospital active medication list Current Facility-Administered Medications  Medication Dose Route Frequency Provider Last Rate Last Admin  . 0.9 %  sodium chloride infusion   Intravenous PRN Hunsucker, Lesia Sago, MD   Paused at 10/01/20 1137  . acetaminophen (TYLENOL) 160 MG/5ML solution 500 mg  500 mg Per Tube Q6H PRN Hunsucker, Lesia Sago, MD   500 mg at 10/02/20 1703  . albuterol (PROVENTIL) (2.5 MG/3ML) 0.083% nebulizer solution 2.5 mg  2.5 mg Nebulization Q2H PRN Hunsucker, Lesia Sago, MD      . aspirin chewable tablet 81 mg  81 mg Oral Daily Hunsucker, Lesia Sago, MD   81 mg at 10/04/20 0842  . brimonidine (ALPHAGAN) 0.2 % ophthalmic solution 1 drop  1 drop Both Eyes BID Hunsucker, Lesia Sago, MD   1 drop at 10/04/20 0843  . Chlorhexidine Gluconate Cloth 2 % PADS 6 each  6 each Topical Daily Hunsucker, Lesia Sago, MD   6 each at 10/02/20 [phone]  . clopidogrel (PLAVIX) tablet 75 mg  75 mg Oral Daily Hunsucker, Lesia Sago, MD   75 mg at 10/04/20 0842  . feeding supplement (ENSURE ENLIVE / ENSURE PLUS) liquid 237 mL  237 mL Oral TID BM Azucena Fallen, MD   237 mL at 10/04/20 0843  . fentaNYL (SUBLIMAZE) injection 50 mcg  50 mcg Intravenous Q2H PRN Hunsucker, Lesia Sago, MD   50 mcg at 09/29/20 0316  . heparin injection 5,000 Units  5,000 Units Subcutaneous Q8H Hunsucker, Lesia Sago, MD   5,000 Units at 10/04/20 0550  . insulin aspart (novoLOG) injection 0-6 Units  0-6 Units Subcutaneous TID WC Lewie Chamber, MD      . loperamide (IMODIUM) capsule 2 mg  2 mg Oral PRN Azucena Fallen, MD   2 mg at 10/01/20 1629  . MEDLINE mouth rinse  15 mL Mouth Rinse BID Hunsucker, Lesia Sago, MD   15 mL at 10/04/20 0843  . phenol (CHLORASEPTIC) mouth spray 1 spray  1 spray Mouth/Throat PRN John Giovanni, MD   1 spray at 10/03/20 2321  . polyethylene glycol (MIRALAX / GLYCOLAX)  packet 17 g  17 g Per Tube Daily PRN Hunsucker, Lesia Sago, MD      . sodium chloride 0.9 % bolus 1,000 mL  1,000 mL Intravenous Once Hunsucker, Lesia Sago, MD   Held at 09/27/20 1140  . timolol (TIMOPTIC) 0.5 % ophthalmic solution 1 drop  1 drop Both Eyes BID Hunsucker, Lesia Sago, MD   1 drop at 10/04/20 5400     Discharge Medications: Please see discharge summary for a list of discharge medications.  Relevant Imaging Results:  Relevant Lab Results:   Additional Information SS#: [id number].  Lorri Frederick, LCSW

## 2020-10-04 NOTE — Progress Notes (Signed)
Inpatient Diabetes Program Recommendations  AACE/ADA: New Consensus Statement on Inpatient Glycemic Control   Target Ranges:  Prepandial:   less than 140 mg/dL      Peak postprandial:   less than 180 mg/dL (1-2 hours)      Critically ill patients:  140 - 180 mg/dL   Results for MOREY, ANDONIAN (MRN [id number]) as of 10/04/2020 10:05  Ref. Range 10/03/2020 07:26 10/03/2020 11:26 10/03/2020 16:40 10/03/2020 21:13 10/03/2020 22:14 10/03/2020 23:17 10/04/2020 08:38  Glucose-Capillary Latest Ref Range: 70 - 99 mg/dL 158 (H) 309 (H)  Novolog 5 units 136 (H)  Novolog 2 units 56 (L) 96 126 (H) 155 (H)  Novolog 3 units   Review of Glycemic Control  Diabetes history: DM2 Outpatient Diabetes medications: Amaryl 1 mg QAM Current orders for Inpatient glycemic control: Novolog 0-15 units TID with meals  Inpatient Diabetes Program Recommendations:    Insulin: Please consider decreasing Novolog correction to 0-9 units TID with meals.  Thanks, Orlando Penner, RN, MSN, CDE Diabetes Coordinator Inpatient Diabetes Program [phone] (Team Pager from 8am to 5pm)

## 2020-10-04 NOTE — Evaluation (Signed)
Clinical/Bedside Swallow Evaluation Patient Details  Name: Jeremiah [name] MRN: [id number] Date of Birth: Apr 13, 1946  Today's Date: 10/04/2020 Time: SLP Start Time (ACUTE ONLY): 9357 SLP Stop Time (ACUTE ONLY): 0953 SLP Time Calculation (min) (ACUTE ONLY): 10 min  Past Medical History:  Past Medical History:  Diagnosis Date  . Amputee   . Aortic atherosclerosis (HCC)   . Arthritis    "joints; shoulders, knees, hands, back" (05/21/2018)  . Atherosclerosis of coronary artery   . C. difficile diarrhea 04/2018  . Diastolic CHF (HCC)   . Diverticulosis   . High cholesterol   . History of gout   . Hypertension   . IDA (iron deficiency anemia)    from referral Dr Darleene Cleaver  . Internal hemorrhoids   . Peripheral vascular disease (HCC)   . Pneumonia    "couple times" (05/21/2018)  . Sleep apnea    "has mask; won't use" (05/21/2018)  . Type II diabetes mellitus (HCC)    Past Surgical History:  Past Surgical History:  Procedure Laterality Date  . ABDOMINAL AORTOGRAM W/LOWER EXTREMITY N/A 11/01/2018   Procedure: ABDOMINAL AORTOGRAM W/LOWER EXTREMITY;  Surgeon: Runell Gess, MD;  Location: [facility] IN[facility]ASIVE CV LAB;  Service: Cardiovascular;  Laterality: N/A;  . ABDOMINAL AORTOGRAM W/LOWER EXTREMITY Left 06/20/2020   Procedure: ABDOMINAL AORTOGRAM W/LOWER EXTREMITY;  Surgeon: Cephus Shelling, MD;  Location: [facility] INVASIVE CV LAB;  Service: Cardiovascular;  Laterality: Left;  . AMPUTATION Right 11/09/2018   Procedure: RIGHT - AMPUTATION ABOVE KNEE;  Surgeon: Maeola Harman, MD;  Location: [facility] OR;  Service: Vascular;  Laterality: Right;  . CATARACT EXTRACTION W/ INTRAOCULAR LENS  IMPLANT, BILATERAL Bilateral   . CHOLECYSTECTOMY  05/21/2018   ATTEMPTED LAPAROSCOPIC CHOLECYSTECTOMY, OPEN DRAINAGE OF GALLBLADDER WITH BIOPSY  . CHOLECYSTECTOMY N/A 05/21/2018   Procedure: ATTEMPTED LAPAROSCOPIC CHOLECYSTECTOMY, OPEN DRAINAGE OF GALLBLADDER WITH BIOPSY;  Surgeon: Griselda Miner, MD;   Location: [facility] OR;  Service: General;  Laterality: N/A;  . COLONOSCOPY W/ POLYPECTOMY    . COLONOSCOPY WITH PROPOFOL N/A 09/16/2018   Procedure: COLONOSCOPY WITH PROPOFOL;  Surgeon: Charlott Rakes, MD;  Location: [facility] ENDOSCOPY;  Service: Endoscopy;  Laterality: N/A;  . 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 FLEXIBLE SIGMOIDOSCOPY N/A 04/20/2020   Procedure: FLEXIBLE SIGMOIDOSCOPY;  Surgeon: Meridee Score Netty Starring., MD;  Location: [facility];  Service: Gastroenterology;  Laterality: N/A;  Fecal Disimpaction  . IR CATHETER TUBE CHANGE  04/14/2018  . IR CHOLANGIOGRAM EXISTING TUBE  03/17/2018  . IR PERC CHOLECYSTOSTOMY  01/31/2018  . IR RADIOLOGIST EVAL & MGMT  03/02/2018  . PERIPHERAL VASCULAR INTERVENTION Left 06/20/2020   Procedure: PERIPHERAL VASCULAR INTERVENTION;  Surgeon: Cephus Shelling, MD;  Location: [facility] INVASIVE CV LAB;  Service: Cardiovascular;  Laterality: Left;  4 SFA STENTS   HPI:  74 year old man complicated PMH presented from [facility] unresponsive after being found hypoxic in the field.  Intubated in ED and admitted by [facility] for acute hypoxic respiratory failure in the setting of likely aspiration versus healthcare associated pneumonia.  Successfully extubated November 6 to room air.  Completed antibiotics November 8.  Noncompliant with CPAP.  Pt has had multiple hospitalizations this fall with concern for aspiration pna.  MBSS on 09/23/20 revealed transient penetration of thin liquid and no aspiration.  There was "mildly reduced anterior laryngeal movement and reduced duration of cricopharyngeal relaxation which resulted in pyriform sinus residue  across consistencies" and recommendations were made for mechanical soft diet with thin liquid.   Assessment / Plan / Recommendation Clinical Impression  Pt presents with functional swallowing as assessed clinically.   Pt tolerated all consistencies trialed with no clinical s/s of aspiration. There was mild oral residue with regular solid which was reduced with liquid wash. Pt has dentures this admission and they were in place for today's evaluation.  Pt c/o sore throat, and burning when eating. Pt is unable to indicate if this occurs more often with solids or liquids.  Discussed diet preferences with pt who was unable to state if he would prefer softer foods for ease of swallow. Pt states that he does not have much of an appetite.    Recommend continuing mechanical soft texture diet with thin liquids to have range of options which may be more palatable to pt.  Please assist pt with selecting foods that may be easier to swallow (for example, pt states that he likes creamed potatoes).  Recommend alternating food with liquids as needed to reduce oral residue and pharyngeal residue noted on 10/31 MBSS.  SLP Visit Diagnosis: Dysphagia, unspecified (R13.10)    Aspiration Risk  Mild aspiration risk    Diet Recommendation Dysphagia 3 (Mech soft);Thin liquid   Liquid Administration via: Cup;Straw Medication Administration: Whole meds with liquid Supervision: Intermittent supervision to cue for compensatory strategies Compensations: Slow rate;Small sips/bites;Follow solids with liquid Postural Changes: Seated upright at 90 degrees    Other  Recommendations Oral Care Recommendations: Oral care BID   Follow up Recommendations Skilled Nursing facility      Frequency and Duration min 2x/week  2 weeks       Prognosis Prognosis for Safe Diet Advancement: Good      Swallow Study   General HPI: 75 year old man complicated PMH presented from [facility] unresponsive after being found hypoxic in the field.  Intubated in ED and admitted by [facility] for acute hypoxic respiratory failure in the setting of likely aspiration versus healthcare associated pneumonia.  Successfully extubated November 6 to room air.  Completed  antibiotics November 8.  Noncompliant with CPAP.  Pt has had multiple hospitalizations this fall with concern for aspiration pna.  MBSS on 09/23/20 revealed transient penetration of thin liquid and no aspiration.  There was "mildly reduced anterior laryngeal movement and reduced duration of cricopharyngeal relaxation which resulted in pyriform sinus residue across consistencies" and recommendations were made for mechanical soft diet with thin liquid. Type of Study: Bedside Swallow Evaluation Previous Swallow Assessment: MBSS 10/31 Diet Prior to this Study: Dysphagia 3 (soft);Thin liquids Temperature Spikes Noted: N/A History of Recent Intubation: Yes Date extubated: 09/29/20 Behavior/Cognition: Alert;Cooperative;Pleasant mood Oral Cavity Assessment: Within Functional Limits Oral Care Completed by SLP: No Oral Cavity - Dentition: Dentures, top;Adequate natural dentition Vision: Functional for self-feeding Self-Feeding Abilities: Able to feed self Patient Positioning: Upright in bed Baseline Vocal Quality: Normal Volitional Cough:  (Fair) Volitional Swallow: Able to elicit    Oral/Motor/Sensory Function Overall Oral Motor/Sensory Function: Within functional limits Facial ROM: Within Functional Limits Facial Symmetry: Within Functional Limits Lingual ROM: Within Functional Limits Lingual Symmetry: Within Functional Limits Lingual Strength: Within Functional Limits Velum: Within Functional Limits Mandible: Within Functional Limits   Ice Chips Ice chips: Not tested   Thin Liquid Thin Liquid: Within functional limits Presentation: Cup;Straw    Nectar Thick Nectar Thick Liquid: Not tested   Honey Thick Honey Thick Liquid: Not tested   Puree Puree: Within functional limits Presentation: Spoon  Solid     Solid: Impaired Oral Phase Functional Implications: Oral residue      Kerrie Pleasure, MA, CCC-SLP Acute Rehabilitation Services Office: [phone]  10/04/2020,10:09 AM

## 2020-10-04 NOTE — Care Management Important Message (Signed)
Important Message  Patient Details  Name: Jeremiah [name] MRN: [id number] Date of Birth: [birth year]   Medicare Important Message Given:  Yes     Dorena Bodo 10/04/2020, 2:44 PM

## 2020-10-04 NOTE — TOC Transition Note (Signed)
Transition of Care [facility]) - CM/SW Discharge Note   Patient Details  Name: Jeremiah [name] MRN: [id number] Date of Birth: 09-12-1946  Transition of Care [facility]) CM/SW Contact:  Lorri Frederick, LCSW Phone Number: 10/04/2020, 11:57 AM   Clinical Narrative:  Pt discharging to [facility], room 206A.  RN call report to [phone].     Final next level of care: Long Term Nursing Home Barriers to Discharge: Barriers Resolved   Patient Goals and CMS Choice        Discharge Placement              Patient chooses bed at: [facility] Patient to be transferred to facility by: [facility] Name of family member notified: son, [name] Patient and family notified of of transfer: 10/04/20  Discharge Plan and Services In-house Referral: Clinical Social Work   Post Acute Care Choice: Skilled Nursing Facility                               Social Determinants of Health (SDOH) Interventions     Readmission Risk Interventions Readmission Risk Prevention Plan 08/13/2020 07/08/2018 07/08/2018  Transportation Screening Complete - Complete  Medication Review [facility]) Referral to Pharmacy - Complete  PCP or Specialist appointment within 3-5 days of discharge Complete - Not Complete  HRI or Home Care Consult Complete (No Data) Complete  SW Recovery Care/Counseling Consult Complete Patient refused Not Complete  Palliative Care Screening Not Applicable - Not Complete  Comments - - (No Data)  Medication Reconcilation (Pharmacy) - - Not Complete  Skilled Nursing Facility Complete - Patient refused  Some recent data might be hidden

## 2020-10-04 NOTE — TOC Progression Note (Addendum)
Transition of Care [facility]) - Progression Note    Patient Details  Name: Jeremiah [name] MRN: [id number] Date of Birth: 09-30-1946  Transition of Care [facility]) CM/SW Contact  Lorri Frederick, LCSW Phone Number: 10/04/2020, 10:42 AM  Clinical Narrative:   Per MD [place], peer to peer denied SNF auth.  CSW spoke with [place] at [facility] and pt can return to long term care today.  Pt has applied for medicaid and it is pending for long term care. No covid test needed per [facility] as pt still in 90 day window.  CSW informed pt of the above.  Pt asked CSW to call son [name].  CSW spoke with [name] who is in agreement with this plan. Would like to be notified if pt is sent today.    Expected Discharge Plan: Skilled Nursing Facility    Expected Discharge Plan and Services Expected Discharge Plan: Skilled Nursing Facility In-house Referral: Clinical Social Work   Post Acute Care Choice: Skilled Nursing Facility Living arrangements for the past 2 months: Skilled Nursing Facility, Single Family Home Expected Discharge Date: 10/02/20                                     Social Determinants of Health (SDOH) Interventions    Readmission Risk Interventions Readmission Risk Prevention Plan 08/13/2020 07/08/2018 07/08/2018  Transportation Screening Complete - Complete  Medication Review [facility]) Referral to Pharmacy - Complete  PCP or Specialist appointment within 3-5 days of discharge Complete - Not Complete  HRI or Home Care Consult Complete (No Data) Complete  SW Recovery Care/Counseling Consult Complete Patient refused Not Complete  Palliative Care Screening Not Applicable - Not Complete  Comments - - (No Data)  Medication Reconcilation (Pharmacy) - - Not Complete  Skilled Nursing Facility Complete - Patient refused  Some recent data might be hidden

## 2020-10-15 DIAGNOSIS — A419 Sepsis, unspecified organism: Secondary | ICD-10-CM

## 2020-10-17 LAB — FUNGUS CULTURE WITH STAIN

## 2020-10-17 LAB — FUNGAL ORGANISM REFLEX

## 2020-10-17 LAB — FUNGUS CULTURE RESULT

## 2020-10-29 ENCOUNTER — Ambulatory Visit: Payer: Medicare Other | Admitting: Pulmonary Disease

## 2020-10-29 NOTE — Progress Notes (Deleted)
Subjective:   PATIENT ID: Jeremiah [name] GENDER: male DOB: 10-04-1946, MRN: [id number]   HPI  No chief complaint on file.   Reason for Visit: ***Follow-up ***New consult for ***  *** Social History:  Environmental exposures: ***  I have personally reviewed patient's past medical/family/social history, allergies, current medications.***  Past Medical History:  Diagnosis Date  . Amputee   . Aortic atherosclerosis ([place])   . Arthritis    "joints; shoulders, knees, hands, back" (05/21/2018)  . Atherosclerosis of coronary artery   . C. difficile diarrhea 04/2018  . Diastolic CHF ([place])   . Diverticulosis   . High cholesterol   . History of gout   . Hypertension   . IDA (iron deficiency anemia)    from referral Dr Daphene Jaeger  . Internal hemorrhoids   . Peripheral vascular disease ([place])   . Pneumonia    "couple times" (05/21/2018)  . Sleep apnea    "has mask; won't use" (05/21/2018)  . Type II diabetes mellitus (HCC)      Family History  Problem Relation Age of Onset  . Diabetes Mother   . Hypertension Mother   . Heart disease Father   . Heart attack Father   . Diabetes Sister   . Hypertension Sister   . Diabetes Brother   . Heart disease Brother   . Hypertension Brother   . Heart attack Brother      Social History   Occupational History  . Occupation: retired  Tobacco Use  . Smoking status: Former Smoker    Years: 24.00    Types: Cigarettes    Quit date: 11/25/1983    Years since quitting: 36.9  . Smokeless tobacco: Never Used  Vaping Use  . Vaping Use: Never used  Substance and Sexual Activity  . Alcohol use: Not Currently  . Drug use: No  . Sexual activity: Not on file    No Known Allergies   Outpatient Medications Prior to Visit  Medication Sig Dispense Refill  . aspirin 81 MG tablet Take 81 mg by mouth daily.    Marland Kitchen atorvastatin (LIPITOR) 20 MG tablet Take 1 tablet (20 mg total) by mouth daily. (Patient taking differently: Take 20 mg by mouth at  bedtime. ) 30 tablet 1  . brimonidine (ALPHAGAN) 0.2 % ophthalmic solution Place 1 drop into both eyes 2 (two) times daily. 5 mL 12  . clopidogrel (PLAVIX) 75 MG tablet Take 1 tablet (75 mg total) by mouth daily with breakfast. 30 tablet 12  . ferrous sulfate 325 (65 FE) MG tablet Take 1 tablet (325 mg total) by mouth 2 (two) times daily with a meal. 60 tablet 1  . gabapentin (NEURONTIN) 300 MG capsule Take 1 capsule (300 mg total) by mouth 2 (two) times daily. 60 capsule 0  . glimepiride (AMARYL) 1 MG tablet Take 1 tablet (1 mg total) by mouth daily with breakfast. 30 tablet 1  . glucose blood test strip Accu-Chek Aviva Plus test strips  Take 1 strip 3 times a day by miscell. route for 90 days.    Marland Kitchen glucose blood test strip Accu-Chek Aviva Plus test strips  USE AS DIRECTED THREE TIMES DAILY.    . hydrALAZINE (APRESOLINE) 100 MG tablet Take 0.5 tablets (50 mg total) by mouth 2 (two) times daily.    . metoprolol succinate (TOPROL-XL) 25 MG 24 hr tablet Take 1 tablet (25 mg total) by mouth daily. 30 tablet 0  . pantoprazole (PROTONIX) 40 MG tablet Take 1  tablet (40 mg total) by mouth daily. 30 tablet 0  . psyllium (METAMUCIL) 58.6 % powder Take 1 packet by mouth daily.    . timolol (TIMOPTIC) 0.5 % ophthalmic solution Place 1 drop into both eyes 2 (two) times daily. 10 mL 6   No facility-administered medications prior to visit.    ROS   Objective:  There were no vitals filed for this visit.    Physical Exam: General: Well-appearing, no acute distress HENT: [place], AT, OP clear, MMM Eyes: EOMI, no scleral icterus Respiratory: Clear to auscultation bilaterally.  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 Right pigtail removed on 10/27. Cultures negative. No pleural studies obtained. Left-sided loculated effusion Hx of covid infection S/p right thoracostomy in September 2021 with good response to pleural thrombolytics. On this admission, left sided loculated effusion and concern for development of right empyema. Sampling of right side consistent with old blood in setting of prior thrombolytic administration and thickened rind. Pleural culture and BAL negative. No evidence of empyema. 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 Fluad Quad(high Dose 65+) 10/02/2020  . Pneumococcal Polysaccharide-23 10/02/2020   CT Lung Screen***  No orders of the defined types were placed in this encounter. No orders of the defined types were placed in this encounter.   No follow-ups on file.  I have spent a total time of***-minutes on the day of the appointment reviewing prior documentation, coordinating care and discussing medical diagnosis and plan with the patient/family. Imaging, labs and tests included in this note have been reviewed and interpreted independently by me.  Ruth, MD [facility] 10/29/2020 10:18 AM  Office Number [phone]

## 2020-12-05 ENCOUNTER — Ambulatory Visit: Payer: Medicare Other

## 2020-12-21 ENCOUNTER — Ambulatory Visit: Payer: Medicare Other

## 2020-12-31 ENCOUNTER — Other Ambulatory Visit: Payer: Self-pay

## 2020-12-31 DIAGNOSIS — I739 Peripheral vascular disease, unspecified: Secondary | ICD-10-CM

## 2021-01-02 ENCOUNTER — Encounter (HOSPITAL_COMMUNITY): Payer: Medicare Other

## 2021-01-02 ENCOUNTER — Ambulatory Visit: Payer: Medicare Other

## 2021-01-22 DEATH — deceased
